# Patient Record
Sex: Male | Born: 1945
Health system: Southern US, Community
[De-identification: ages and names within clinical notes are randomized; demographics above are authoritative.]

## PROBLEM LIST (undated history)

## (undated) DIAGNOSIS — I10 Essential (primary) hypertension: Secondary | ICD-10-CM

## (undated) DIAGNOSIS — H409 Unspecified glaucoma: Secondary | ICD-10-CM

## (undated) DIAGNOSIS — N281 Cyst of kidney, acquired: Secondary | ICD-10-CM

## (undated) DIAGNOSIS — D689 Coagulation defect, unspecified: Secondary | ICD-10-CM

## (undated) DIAGNOSIS — E7800 Pure hypercholesterolemia, unspecified: Secondary | ICD-10-CM

## (undated) DIAGNOSIS — I82409 Acute embolism and thrombosis of unspecified deep veins of unspecified lower extremity: Secondary | ICD-10-CM

## (undated) DIAGNOSIS — R32 Unspecified urinary incontinence: Secondary | ICD-10-CM

## (undated) DIAGNOSIS — K5790 Diverticulosis of intestine, part unspecified, without perforation or abscess without bleeding: Secondary | ICD-10-CM

## (undated) DIAGNOSIS — K7689 Other specified diseases of liver: Secondary | ICD-10-CM

## (undated) DIAGNOSIS — G4730 Sleep apnea, unspecified: Secondary | ICD-10-CM

## (undated) HISTORY — DX: Unspecified urinary incontinence: R32

## (undated) HISTORY — DX: Other specified diseases of liver: K76.89

## (undated) HISTORY — DX: Acute embolism and thrombosis of unspecified deep veins of unspecified lower extremity: I82.409

## (undated) HISTORY — DX: Cyst of kidney, acquired: N28.1

## (undated) HISTORY — DX: Coagulation defect, unspecified: D68.9

## (undated) HISTORY — DX: Diverticulosis of intestine, part unspecified, without perforation or abscess without bleeding: K57.90

## (undated) HISTORY — PX: POLYPECTOMY: SHX149

## (undated) HISTORY — DX: Unspecified glaucoma: H40.9

## (undated) HISTORY — DX: Essential (primary) hypertension: I10

## (undated) HISTORY — DX: Sleep apnea, unspecified: G47.30

---

## 1998-05-04 ENCOUNTER — Emergency Department (HOSPITAL_COMMUNITY): Admission: EM | Admit: 1998-05-04 | Discharge: 1998-05-04 | Payer: Self-pay | Admitting: Emergency Medicine

## 1998-12-04 ENCOUNTER — Encounter: Payer: Self-pay | Admitting: Emergency Medicine

## 1998-12-04 ENCOUNTER — Emergency Department (HOSPITAL_COMMUNITY): Admission: EM | Admit: 1998-12-04 | Discharge: 1998-12-04 | Payer: Self-pay | Admitting: Emergency Medicine

## 1999-10-09 ENCOUNTER — Encounter: Payer: Self-pay | Admitting: Emergency Medicine

## 1999-10-09 ENCOUNTER — Inpatient Hospital Stay (HOSPITAL_COMMUNITY): Admission: EM | Admit: 1999-10-09 | Discharge: 1999-10-12 | Payer: Self-pay | Admitting: Emergency Medicine

## 1999-10-11 ENCOUNTER — Encounter: Payer: Self-pay | Admitting: Cardiology

## 1999-10-13 ENCOUNTER — Encounter: Admission: RE | Admit: 1999-10-13 | Discharge: 1999-10-13 | Payer: Self-pay | Admitting: Urology

## 1999-10-13 ENCOUNTER — Encounter: Payer: Self-pay | Admitting: Urology

## 1999-10-28 ENCOUNTER — Encounter: Payer: Self-pay | Admitting: Urology

## 1999-10-28 ENCOUNTER — Encounter: Admission: RE | Admit: 1999-10-28 | Discharge: 1999-10-28 | Payer: Self-pay | Admitting: Urology

## 1999-11-16 ENCOUNTER — Encounter: Payer: Self-pay | Admitting: Urology

## 1999-11-16 ENCOUNTER — Encounter: Admission: RE | Admit: 1999-11-16 | Discharge: 1999-11-16 | Payer: Self-pay | Admitting: Urology

## 2000-09-21 ENCOUNTER — Ambulatory Visit (HOSPITAL_COMMUNITY): Admission: RE | Admit: 2000-09-21 | Discharge: 2000-09-21 | Payer: Self-pay | Admitting: Orthopedic Surgery

## 2001-03-29 ENCOUNTER — Encounter: Payer: Self-pay | Admitting: Cardiology

## 2001-03-29 ENCOUNTER — Ambulatory Visit (HOSPITAL_COMMUNITY): Admission: RE | Admit: 2001-03-29 | Discharge: 2001-03-29 | Payer: Self-pay | Admitting: Cardiology

## 2002-10-04 HISTORY — PX: KNEE SURGERY: SHX244

## 2003-06-07 ENCOUNTER — Encounter: Payer: Self-pay | Admitting: Urology

## 2003-06-07 ENCOUNTER — Inpatient Hospital Stay (HOSPITAL_COMMUNITY): Admission: EM | Admit: 2003-06-07 | Discharge: 2003-06-09 | Payer: Self-pay | Admitting: Urology

## 2008-03-12 ENCOUNTER — Emergency Department (HOSPITAL_COMMUNITY): Admission: EM | Admit: 2008-03-12 | Discharge: 2008-03-12 | Payer: Self-pay | Admitting: Neurosurgery

## 2009-05-21 ENCOUNTER — Inpatient Hospital Stay (HOSPITAL_COMMUNITY): Admission: RE | Admit: 2009-05-21 | Discharge: 2009-05-26 | Payer: Self-pay | Admitting: Cardiology

## 2009-05-21 ENCOUNTER — Ambulatory Visit: Payer: Self-pay | Admitting: Vascular Surgery

## 2009-05-21 ENCOUNTER — Encounter (INDEPENDENT_AMBULATORY_CARE_PROVIDER_SITE_OTHER): Payer: Self-pay | Admitting: Cardiology

## 2010-04-17 ENCOUNTER — Inpatient Hospital Stay (HOSPITAL_COMMUNITY): Admission: EM | Admit: 2010-04-17 | Discharge: 2010-04-20 | Payer: Self-pay | Admitting: Emergency Medicine

## 2010-12-19 LAB — GLUCOSE, CAPILLARY
Glucose-Capillary: 137 mg/dL — ABNORMAL HIGH (ref 70–99)
Glucose-Capillary: 141 mg/dL — ABNORMAL HIGH (ref 70–99)
Glucose-Capillary: 143 mg/dL — ABNORMAL HIGH (ref 70–99)
Glucose-Capillary: 170 mg/dL — ABNORMAL HIGH (ref 70–99)
Glucose-Capillary: 171 mg/dL — ABNORMAL HIGH (ref 70–99)
Glucose-Capillary: 195 mg/dL — ABNORMAL HIGH (ref 70–99)
Glucose-Capillary: 204 mg/dL — ABNORMAL HIGH (ref 70–99)
Glucose-Capillary: 249 mg/dL — ABNORMAL HIGH (ref 70–99)
Glucose-Capillary: 272 mg/dL — ABNORMAL HIGH (ref 70–99)
Glucose-Capillary: 279 mg/dL — ABNORMAL HIGH (ref 70–99)
Glucose-Capillary: 281 mg/dL — ABNORMAL HIGH (ref 70–99)
Glucose-Capillary: 285 mg/dL — ABNORMAL HIGH (ref 70–99)
Glucose-Capillary: 306 mg/dL — ABNORMAL HIGH (ref 70–99)
Glucose-Capillary: 332 mg/dL — ABNORMAL HIGH (ref 70–99)
Glucose-Capillary: 357 mg/dL — ABNORMAL HIGH (ref 70–99)
Glucose-Capillary: 369 mg/dL — ABNORMAL HIGH (ref 70–99)
Glucose-Capillary: 412 mg/dL — ABNORMAL HIGH (ref 70–99)
Glucose-Capillary: 452 mg/dL — ABNORMAL HIGH (ref 70–99)
Glucose-Capillary: 460 mg/dL — ABNORMAL HIGH (ref 70–99)
Glucose-Capillary: 460 mg/dL — ABNORMAL HIGH (ref 70–99)
Glucose-Capillary: >600 mg/dL (ref 70–99)
Glucose-Capillary: >600 mg/dL (ref 70–99)
Glucose-Capillary: >600 mg/dL (ref 70–99)

## 2010-12-19 LAB — BASIC METABOLIC PANEL WITH GFR
BUN: 11 mg/dL (ref 6–23)
BUN: 18 mg/dL (ref 6–23)
BUN: 22 mg/dL (ref 6–23)
CO2: 27 meq/L (ref 19–32)
CO2: 27 meq/L (ref 19–32)
CO2: 28 meq/L (ref 19–32)
Calcium: 8.5 mg/dL (ref 8.4–10.5)
Calcium: 8.8 mg/dL (ref 8.4–10.5)
Calcium: 9 mg/dL (ref 8.4–10.5)
Chloride: 100 meq/L (ref 96–112)
Chloride: 107 meq/L (ref 96–112)
Chloride: 97 meq/L (ref 96–112)
Creatinine, Ser: 0.86 mg/dL (ref 0.4–1.5)
Creatinine, Ser: 0.96 mg/dL (ref 0.4–1.5)
Creatinine, Ser: 1 mg/dL (ref 0.4–1.5)
GFR calc non Af Amer: >60 mL/min
GFR calc non Af Amer: >60 mL/min
GFR calc non Af Amer: >60 mL/min
Glucose, Bld: 102 mg/dL — ABNORMAL HIGH (ref 70–99)
Glucose, Bld: 133 mg/dL — ABNORMAL HIGH (ref 70–99)
Glucose, Bld: 314 mg/dL — ABNORMAL HIGH (ref 70–99)
Potassium: 3.6 meq/L (ref 3.5–5.1)
Potassium: 3.7 meq/L (ref 3.5–5.1)
Potassium: 3.7 meq/L (ref 3.5–5.1)
Sodium: 132 meq/L — ABNORMAL LOW (ref 135–145)
Sodium: 138 meq/L (ref 135–145)
Sodium: 139 meq/L (ref 135–145)

## 2010-12-19 LAB — CARDIAC PANEL(CRET KIN+CKTOT+MB+TROPI)
CK, MB: 2.7 ng/mL (ref 0.3–4.0)
CK, MB: 3 ng/mL (ref 0.3–4.0)
Relative Index: 1.8 (ref 0.0–2.5)
Relative Index: 1.9 (ref 0.0–2.5)
Total CK: 234 U/L — ABNORMAL HIGH (ref 7–232)
Troponin I: 0.01 ng/mL (ref 0.00–0.06)
Troponin I: 0.02 ng/mL (ref 0.00–0.06)

## 2010-12-19 LAB — URINALYSIS, ROUTINE W REFLEX MICROSCOPIC
Bilirubin Urine: NEGATIVE
Ketones, ur: 15 mg/dL — AB
Nitrite: NEGATIVE
Specific Gravity, Urine: 1.028 (ref 1.005–1.030)
Urobilinogen, UA: 0.2 mg/dL (ref 0.0–1.0)

## 2010-12-19 LAB — LIPID PANEL
Cholesterol: 272 mg/dL — ABNORMAL HIGH (ref 0–200)
HDL: 41 mg/dL (ref >39)
LDL Cholesterol: 202 mg/dL — ABNORMAL HIGH (ref 0–99)
Total CHOL/HDL Ratio: 6.6 RATIO
VLDL: 29 mg/dL (ref 0–40)

## 2010-12-19 LAB — CBC
HCT: 50.5 % (ref 39.0–52.0)
MCV: 96.6 fL (ref 78.0–100.0)
RBC: 5.23 MIL/uL (ref 4.22–5.81)
WBC: 9.8 10*3/uL (ref 4.0–10.5)

## 2010-12-19 LAB — COMPREHENSIVE METABOLIC PANEL
Alkaline Phosphatase: 99 U/L (ref 39–117)
BUN: 31 mg/dL — ABNORMAL HIGH (ref 6–23)
CO2: 18 mEq/L — ABNORMAL LOW (ref 19–32)
Chloride: 79 mEq/L — ABNORMAL LOW (ref 96–112)
GFR calc non Af Amer: 36 mL/min — ABNORMAL LOW (ref >60)
Glucose, Bld: 1139 mg/dL (ref 70–99)
Potassium: 6.6 mEq/L (ref 3.5–5.1)
Total Bilirubin: 1.2 mg/dL (ref 0.3–1.2)

## 2010-12-19 LAB — POCT I-STAT 3, ART BLOOD GAS (G3+)
Acid-Base Excess: 1 mmol/L (ref 0.0–2.0)
Bicarbonate: 23.8 mEq/L (ref 20.0–24.0)

## 2010-12-19 LAB — BASIC METABOLIC PANEL
BUN: 18 mg/dL (ref 6–23)
BUN: 22 mg/dL (ref 6–23)
BUN: 29 mg/dL — ABNORMAL HIGH (ref 6–23)
CO2: 27 mEq/L (ref 19–32)
Calcium: 9.4 mg/dL (ref 8.4–10.5)
Chloride: 94 mEq/L — ABNORMAL LOW (ref 96–112)
Creatinine, Ser: 1.09 mg/dL (ref 0.4–1.5)
Creatinine, Ser: 1.15 mg/dL (ref 0.4–1.5)
GFR calc Af Amer: >60 mL/min (ref >60)
GFR calc non Af Amer: 41 mL/min — ABNORMAL LOW (ref >60)
GFR calc non Af Amer: 53 mL/min — ABNORMAL LOW (ref >60)
GFR calc non Af Amer: >60 mL/min (ref >60)
Glucose, Bld: 444 mg/dL — ABNORMAL HIGH (ref 70–99)
Glucose, Bld: 650 mg/dL (ref 70–99)
Potassium: 4 mEq/L (ref 3.5–5.1)
Potassium: 4.8 mEq/L (ref 3.5–5.1)
Sodium: 137 mEq/L (ref 135–145)

## 2010-12-19 LAB — MRSA PCR SCREENING: MRSA by PCR: NEGATIVE

## 2010-12-19 LAB — C-PEPTIDE: C-Peptide: 1.15 ng/mL (ref 0.80–3.90)

## 2010-12-19 LAB — DIFFERENTIAL
Basophils Absolute: 0 10*3/uL (ref 0.0–0.1)
Basophils Relative: 0 % (ref 0–1)
Monocytes Absolute: 0.4 10*3/uL (ref 0.1–1.0)
Neutro Abs: 8.8 10*3/uL — ABNORMAL HIGH (ref 1.7–7.7)
Neutrophils Relative %: 90 % — ABNORMAL HIGH (ref 43–77)

## 2011-01-09 LAB — GLUCOSE, CAPILLARY
Glucose-Capillary: 115 mg/dL — ABNORMAL HIGH (ref 70–99)
Glucose-Capillary: 118 mg/dL — ABNORMAL HIGH (ref 70–99)
Glucose-Capillary: 133 mg/dL — ABNORMAL HIGH (ref 70–99)
Glucose-Capillary: 139 mg/dL — ABNORMAL HIGH (ref 70–99)
Glucose-Capillary: 143 mg/dL — ABNORMAL HIGH (ref 70–99)
Glucose-Capillary: 165 mg/dL — ABNORMAL HIGH (ref 70–99)
Glucose-Capillary: 169 mg/dL — ABNORMAL HIGH (ref 70–99)
Glucose-Capillary: 196 mg/dL — ABNORMAL HIGH (ref 70–99)
Glucose-Capillary: 227 mg/dL — ABNORMAL HIGH (ref 70–99)
Glucose-Capillary: 85 mg/dL (ref 70–99)

## 2011-01-09 LAB — COMPREHENSIVE METABOLIC PANEL
ALT: 62 U/L — ABNORMAL HIGH (ref 0–53)
Albumin: 3.4 g/dL — ABNORMAL LOW (ref 3.5–5.2)
Alkaline Phosphatase: 62 U/L (ref 39–117)
GFR calc Af Amer: >60 mL/min (ref >60)
Potassium: 3.8 mEq/L (ref 3.5–5.1)
Sodium: 138 mEq/L (ref 135–145)
Total Protein: 6.9 g/dL (ref 6.0–8.3)

## 2011-01-09 LAB — HEMOGLOBIN A1C: Hgb A1c MFr Bld: 8.9 % — ABNORMAL HIGH (ref 4.6–6.1)

## 2011-01-09 LAB — PSA: PSA: 2.78 ng/mL (ref 0.10–4.00)

## 2011-01-09 LAB — URINALYSIS, MICROSCOPIC ONLY
Glucose, UA: 100 mg/dL — AB
Leukocytes, UA: NEGATIVE
pH: 6 (ref 5.0–8.0)

## 2011-01-09 LAB — CBC
Hemoglobin: 14.3 g/dL (ref 13.0–17.0)
Hemoglobin: 14.9 g/dL (ref 13.0–17.0)
Platelets: 157 10*3/uL (ref 150–400)
Platelets: 162 10*3/uL (ref 150–400)
RBC: 4.54 MIL/uL (ref 4.22–5.81)
RDW: 12.5 % (ref 11.5–15.5)
RDW: 12.6 % (ref 11.5–15.5)
WBC: 5.4 10*3/uL (ref 4.0–10.5)

## 2011-01-09 LAB — LIPID PANEL
Total CHOL/HDL Ratio: 6.8 RATIO
VLDL: 22 mg/dL (ref 0–40)

## 2011-01-09 LAB — PROTIME-INR
INR: 1.1 (ref 0.00–1.49)
INR: 1.1 (ref 0.00–1.49)
INR: 2.1 — ABNORMAL HIGH (ref 0.00–1.49)
Prothrombin Time: 13.7 seconds (ref 11.6–15.2)
Prothrombin Time: 14 seconds (ref 11.6–15.2)

## 2011-01-09 LAB — HEPARIN LEVEL (UNFRACTIONATED)
Heparin Unfractionated: 0.5 IU/mL (ref 0.30–0.70)
Heparin Unfractionated: 0.51 IU/mL (ref 0.30–0.70)
Heparin Unfractionated: 1.03 IU/mL — ABNORMAL HIGH (ref 0.30–0.70)

## 2011-02-19 NOTE — Discharge Summary (Signed)
NAME:  CHENG, DEC                        ACCOUNT NO.:  [id number]   MEDICAL RECORD NO.:  [id number]                   PATIENT TYPE:  INP   LOCATION:  0354                                 FACILITY:  [facility]   PHYSICIAN:  Mohan N. Sharyn Lull, M.D.              DATE OF BIRTH:  25-Jul-1946   DATE OF ADMISSION:  06/07/2003  DATE OF DISCHARGE:  06/09/2003                                 DISCHARGE SUMMARY   ADMISSION DIAGNOSES:  1. New onset diabetes mellitus.  2. Elevated blood pressure, rule out hypertension.  3. Left ventricular hypertrophy.  4. Benign hypertrophy of prostate.  5. Gastrointestinal reflux disease.  6. History of kidney stones.   FINAL DIAGNOSES:  1. New onset diabetes mellitus.  2. Benign hypertrophy of prostate.  3. Gastrointestinal reflux disease.  4. History of kidney stones.   DISCHARGE HOME MEDICATIONS:  1. Actos 30 mg one tablet daily.  2. Glucotrol XL 5 mg one tablet twice daily.  3. Baby aspirin 81 mg one tablet daily.  4. Protonix 40 mg one tablet daily.   ACTIVITY:  As tolerated.   DIET:  Low-salt, low-cholesterol, 1800 calorie, ADA diet.   SPECIAL INSTRUCTIONS:  The patient has been advised to monitor blood sugar  twice daily and chart.   FOLLOWUP:  Follow up with Dr. Shana Chute in one week.  Call Dr. Logan Bores and get  appointment in one to two weeks.  Diabetes outpatient teaching program will  contact the patient regarding diabetic teaching and education.   BRIEF HISTORY:  Mr. Landin is a 65 year old black male with no significant  past medical history, except for benign hypertrophy of prostate.  He came to  the [facility] ER for cystoscopy and possible TURP.  During  preoperative evaluation, the patient was noted to have blood sugar of 483  and surgery was postponed.  The patient complains of polyuria, polydipsia,  and polyphagia for approximately one month.  Denies any blurring of vision.  Denies tingling and numbness in the arms.   Denies chest pain, shortness of  breath, palpitations, lightheadedness, or syncope.  Denies PND, orthopnea,  or leg swelling.  Denies fever, chills, or cough.  Denies any dysuria.   PAST MEDICAL HISTORY:  As above plus a history of kidney stones.   PAST SURGICAL HISTORY:  He had left knee surgery in 2001.  He also had right  eye surgery many years ago.   ALLERGIES:  He is allergic to CODEINE.  He gets a rash.   MEDICATIONS:  At home, he was on aspirin.   SOCIAL HISTORY:  He is married and has two daughters.  No history of smoking  or alcohol abuse.  He works for [employer].  He was born [place]   FAMILY HISTORY:  His father is alive.  He is 31 and has a history of angina.  His mother is  diabetes.  She is 77.  Two brothers and one sister in good  health.   PHYSICAL EXAMINATION:  GENERAL APPEARANCE:  He was alert, awake, and  oriented x 3 and in no acute distress.  VITAL SIGNS:  His blood pressure was 150/90 and the pulse was 88 and  regular.  HEENT:  The conjunctivae were pink.  NECK:  Supple.  No JVD.  No bruit.  LUNGS:  Clear to auscultation without rhonchi or rales.  CARDIOVASCULAR:  S1 and S2 were normal.  There was no S3 gallop or murmur.  ABDOMEN:  Soft.  Bowel sounds were present and nontender.  EXTREMITIES:  There is no cyanosis, clubbing, or edema.  Pedal pulses were  2+ and equal bilaterally.   LABORATORY DATA:  EKG showed normal sinus rhythm with LVH.  No acute  ischemic changes were noted.  His chest x-ray showed no active disease.  His  hemoglobin was 15.8, hematocrit 45.2, and white count 6.8.  The sodium was  129, potassium 4.3, chloride 96, bicarbonate 23, blood sugar 483, BUN 13,  and creatinine 1.2.  The hemoglobin A1C was 15.3.  His lipid panel showed  cholesterol 185, triglycerides 109, LDL 126, and HDL low at 37.  Urinalysis:  The specific gravity was 1.039, ketones were positive, and proteins were  negative.   Repeat electrolytes on June 07, 2003, were sodium 134,  potassium 3.5, chloride 103, bicarbonate 24, blood sugar 379, BUN 13, and  creatinine 1.0.  On June 08, 2003, his blood sugar was 169.  The  potassium was 3.1, which was replaced.  Today the potassium is 3.7.   BRIEF HOSPITAL COURSE:  The patient was admitted to a regular floor.  The  patient was started on sliding scale insulin and was started on Glucotrol  and Actos.  His blood sugar was well controlled yesterday morning, but he  had  an episode of severe hyperglycemia with blood pressure in the 380s last  night.  This morning his blood sugar is 220.  The patient has been  ambulating in the hallway without any problems and eating well.  The patient  will be discharged home on the above medications.  He will be followed as an  outpatient by Dr. Shana Chute in one week.                                               Eduardo Osier. Sharyn Lull, M.D.    MNH/MEDQ  D:  06/09/2003  T:  06/09/2003  Job:  [id number]   cc:   [name], M.D.  [address] [id number]  [place]  [place] [address]  Fax: [phone]   Jamison Neighbor, M.D.  [address]  [place]  [place] [address]  Fax: [phone]

## 2011-02-19 NOTE — Op Note (Signed)
[place]. [facility]  Patient:    Charles Parker                      MRN: [id number] Proc. Date: 10/09/99 Adm. Date:  [id number] Attending:  Pola Corn CC:         [name], M.D.                           Operative Report  PREOPERATIVE DIAGNOSIS: Left ureteral calculus with hydronephrosis.  POSTOPERATIVE DIAGNOSIS: Left ureteral calculus with hydronephrosis.  PROCEDURE: Cystoscopy, left ventral interpretation with left double-J catheter insertion.  SURGEON:  Jamison Neighbor, M.D.  ANESTHESIA: General.  COMPLICATIONS: None.  DRAINS: 6 French x 26 cm double-J catheter.  INDICATIONS:  The patient was admitted to the hospital on October 09, 1999, with  abdominal pain.  The patients pain was somewhat midline but when a CT scan was obtained, he was noted to have hydronephrosis on the left with delayed function and what appeared to be a 4.5 mm stone in the left ureter.  The patients pain was persistent requiring a combination of Toradol and PCA.  He is now to undergo cystoscopy and retrograde studies with stent placement.  The stone appears to be in the more proximal ureter so it is not ideal for ureteroscopy although it is somewhat difficult to visualize on plain x-ray.  The patient will have the stent placement with followup x-rays.  If the stone has dropped down into the proximal ureter he notes that we will try to extract it but otherwise he will be scheduled for ESWL.  DESCRIPTION OF PROCEDURE:  After successful induction of general anesthesia, the patient was placed in the dorsal lithotomy position, prepped with Betadine and draped in the usual fashion.  A cystoscope was inserted.  The urethra was visualized in its entirety and found to be normal beyond the verumontanum. There was no significant prostatic obstruction.  The bladder itself was carefully inspected.  It was free of any tumor or stones.  Both ureteral orifices  were normal in configuration and location.  The left ureter was somewhat tight so the 6 French stent had to be inserted using a guide wire.  This was then advanced just into he distal ureter.  The distal ureter was normal but from approximately the midportion on it was dilated.  It was difficult to visualize the stone because there was some air in the system and bubbled did obscure the stone itself.  A guide wire was then passed up into the collecting system which was noted to be ______ but no filling defects were seen and it appeared otherwise to be normal.  A 6 French x 26 cm double J was then advanced up into the kidney where it coiled normally in the kidney as well as within the bladder.  The bladder was drained.  The patient tolerated the procedure well and was taken to the recovery room in ood condition.  The patient will be watched today. If this pain is gone he certainly can be discharged.  Arrangements were made for him to have follow up ESWL. DD:  10/21/99 TD:  10/11/99 Job: [id number]

## 2011-02-19 NOTE — Op Note (Signed)
[facility]  Patient:    Charles Parker, Charles Parker                     MRN: [id number] Proc. Date: 09/21/00 Adm. Date:  [id number] Attending:  Marlowe Kays Page                           Operative Report  PREOPERATIVE DIAGNOSIS:  Torn medial lateral menisci left knee. POSTOPERATIVE DIAGNOSIS:  Torn medial lateral menisci left knee.  OPERATION: 1. Left knee arthroscopy with partial medial lateral meniscectomies. 2. Shaving of medial femoral condyle and patella.  SURGEON:  Illene Labrador. Aplington, M.D.  ASSISTANT:  Nurse.  ANESTHESIA:  General.  PATHOLOGY AND JUSTIFICATION FOR PROCEDURE:  He injured his left knee while working, roughly 7-1/2 months ago.  Plain films demonstrated some very slight medial compartment narrowing with some very slight tenderness over both joint lines.  I obtained an MRI of his knee which demonstrated tears of both menisci.  Accordingly, he is here today for the above-mentioned surgery.  At surgery, he had extensive tears of both menisci with the medial meniscus torn in the entire posterior half and the lateral meniscus torn at both the anterior and posterior curves.  He also had some grade 2/4 chondromalacia of the mid femoral condyle and patella.  DESCRIPTION OF PROCEDURE:  Satisfactory general anesthesia, pneumatic tourniquet placed, thigh stabilizer.  The left knee was prepped with Dura-Prep and draped in a sterile field.  Superior medial saline inflow.  First an anterolateral portal medial compartment of the knee joint was evaluated.  The extensive tears were noted, and I trimmed the meniscus in the entire posterior half including the intercondylar area back to a stable rim with baskets and then shaved it down until smooth with the 3.5 shaver.  Along the way, I also shaved down the weightbearing surface of the medial femoral condyle until smooth.  Looking at the medial gutter and suprapatellar area, he had some wear of the  patella which I pictured but actually had to shave it through the reverse portal techniques for a better approachability.  Before shifting portals, I also used the underwater Bovie to coagulate some bleeders in the anterior compartment.  Next, through an anterior medial portal looking laterally, small amount of synovitis was resected.  The two tears of the curves were noted, and I trimmed most of these tears back with small baskets and then shaved down the rim until smooth with the 3.5 shaver with final pictures being taken.  I then looked up in the lateral gutter and suprapatellar area and shaved down the patella until smooth with final pictures being taken as well.  The knee joint was then irrigated until clear, and all fluid possible was removed.  The two anterior portals were closed with 4-0 nylon; 20 cc of 1/2% Marcaine with adrenalin was flowed through the inflow apparatus which was removed and this portal closed with 4-0 nylon as well. Betadine and Adaptic dry sterile dressing applied.  Tourniquet was released. He tolerated the procedure well.  At the time of this dictation was on his way to the recovery room in satisfactory condition with no known complications. DD:  09/21/00 TD:  09/22/00 Job: [id number]

## 2011-02-19 NOTE — Consult Note (Signed)
[place]. [facility]  Patient:    Charles Parker                      MRN: [id number] Proc. Date: 10/10/99 Adm. Date:  [id number] Attending:  Pola Corn CC:         Jamison Neighbor, M.D.             [name], M.D.                          Consultation Report  REASON FOR CONSULTATION:  Abdominal pain secondary to ureteral calculus.  HISTORY:  This 65 year old male was admitted from the emergency room last night  with acute abdominal pain along with some associated nausea.  As part of his evaluation, the patient underwent a CT scan which showed a 4.5 mm stone in the eft ureter with high-grade obstruction.  The patient has not had much relief of his  pain overnight and has not yet passed the stone.  He is still requiring morphine.  The patient does complain of pain that is a little more diffuse and toward the midline than one would normally expect, and he does not have a true left flank pain. The patient does have a negative amylase, and the CT scan did show a normal appendix.  PAST MEDICAL HISTORY:  Quite unremarkable.  He denies any chronic medical illnesses.  His only previous surgery was a surgery to the right eye as a child. His urologic history is pertinent for a stone that he passed at one time in the  past, but he has not had ongoing stones since that time.  MEDICATIONS:  The patient takes no medications at this time.  ALLERGIES:  CODEINE but no other allergies.  SOCIAL HISTORY:  Unremarkable.  He does not smoke or use alcohol.  FAMILY HISTORY:  Noncontributory.  His mother had diabetes.  His father is alive and in good health.  REVIEW OF SYSTEMS:  Noncontributory.  He has no cardiac, pulmonary, neurologic,  hematologic, endocrine, musculoskeletal problems.  He is known to wear glasses.  PHYSICAL EXAMINATION:  GENERAL:  He is a well-developed, well-nourished male in no acute distress.  VITAL SIGNS:  He is afebrile.   Vital signs are stable.  ABDOMEN:  His abdominal pain appears to be on the midline and really does not lateralize much to the left-hand side, but he is somewhat tender on specific examination.  LABORATORY DATA:  CT scan was reviewed, and there is a 4.5 mm stone in the mid eft ureter with high-grade obstruction.  I think this is the most logical source for his pain.  Urine has not been checked but will be checked.  His acute abdomen and chest was unremarkable.  The stone was not well seen on this study indicating the stone may be not particularly well opacified.  His laboratory studies show a white count of 7.5, hematocrit 43.4.  Coags were normal.  Chemistries were unremarkable with the exception of a slightly elevated glucose of 139.  Lipase was normal, and amylase was normal.  IMPRESSION:  Nephrolithiasis.  PLAN:  The patient will need cystoscopy retrograde studies and stent placement versus stone extraction tomorrow.  He will be n.p.o. tonight, and this will be one if he does not pass the stone.  The patient will eventually require ESWL and/or in situ laser lithotripsy done at [facility] where that equipment is available.  DD:  10/10/99 TD:  10/10/99 Job: [id number]

## 2011-02-19 NOTE — Discharge Summary (Signed)
[place]. [facility]  Patient:    Charles Parker                      MRN: [id number] Adm. Date:  [id number] Disc. Date: [id number] Attending:  Pola Corn Dictator:   Lyla Son. Achilles Dunk.                           Discharge Summary  DISCHARGE DIAGNOSES: 1. Calculus of the ureter. 2. Hydronephrosis.  BRIEF HISTORY:  This is a 65 year old patient who presented to the [facility] on October 09, 1999 with complaint of abdominal pain.  He described vomiting and diarrhea.  He has noted some coffee-ground material in the vomitus.  Also, some loss of appetite.  He had a CT scan of the abdomen done, and it showed a 4.5 mm left mid ureter stone with high-grade obstruction and hydronephrosis.  The patient was subsequently admitted for abdominal pain and urolithiasis.  HOSPITAL COURSE:  The patient was admitted to the medical service.  He was placed on IV fluids for hydration.  He was treated with IV pain medication. The patient was seen by urology on October 10, 1999.  After review of the CT scan, the patient was taken to the operating room on October 11, 1999, where he had a cystoscopy and a J-stent was also inserted. The patient continued to complain of some abdominal pain.  He received a soapsuds enema and had a good BM following that.  The abdominal pain resolved after the BM.  Plans were made for discharge on October 11, 1999, but the patient spiked a temperature of 101.  This was treated with fluids and Tylenol, and after fluids, Tylenol, and antibiotic therapy, the patient was discharged home on October 12, 1999.  He is scheduled for follow up with Dr. Shana Chute in one week. He is scheduled for follow up with Dr. Logan Bores for reevaluation of the stent. The patient is to call the office for an appointment.  DISCHARGE MEDICATIONS: 1. Lorcet. 2. Septra DS 1 twice a day. DD:  12/02/99 TD:  12/02/99 Job: [id number]

## 2011-12-27 ENCOUNTER — Ambulatory Visit (INDEPENDENT_AMBULATORY_CARE_PROVIDER_SITE_OTHER): Payer: MEDICARE | Admitting: Surgery

## 2011-12-27 ENCOUNTER — Encounter (INDEPENDENT_AMBULATORY_CARE_PROVIDER_SITE_OTHER): Payer: Self-pay | Admitting: Surgery

## 2011-12-27 VITALS — BP 132/80 | HR 72 | Temp 97.6°F | Resp 18 | Ht 72.0 in | Wt 198.2 lb

## 2011-12-27 DIAGNOSIS — D126 Benign neoplasm of colon, unspecified: Secondary | ICD-10-CM | POA: Insufficient documentation

## 2011-12-27 NOTE — Progress Notes (Signed)
Subjective:     Patient ID: Charles Parker, male   DOB: 1946-03-11, 66 y.o.   MRN: [id number]  HPI  Charles Parker  1946-04-02 [id number]  Patient Care Team: Pola Corn, MD as PCP - General (Cardiology) Shirley Friar, MD as Consulting Physician (Gastroenterology)  This patient is a 66 y.o.male who presents today for surgical evaluation at the request of Dr. Bosie Clos.   The patient is a pleasant gentleman who got his first colonoscopy. He was found to have a suspicious mass at the rectosigmoid junction. Biopsies showed adenomatous polyp with high at least high-grade dysplasia. Patient concerned, Dr. Bosie Clos sent the patient to [place] for surgical evaluation.  The patient usually has about 2 bowel movements a day. He can walk half a mile every day without much problems. He comes today with his brother. His brother may have had one polyp and a prior colonoscopy but otherwise no strong family history of polyps. No bleeding. No constipation. No history of wound infections. No prior abdominal surgeries.  Patient Active Problem List  Diagnoses  . 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 Hypertension  . Diabetes mellitus type II    Past Medical History  Diagnosis Date  . Cancer   . Hypertension   . Arthritis   . Diabetes mellitus     type II  . Tubulovillous adenoma     Past Surgical History  Procedure Date  . Knee surgery 2004    left    History   Social History  . Marital Status: Widowed    Spouse Name: N/A    Number of Children: N/A  . Years of Education: N/A   Occupational History  . Not on file.   Social History Main Topics  . Smoking status: Never Smoker   . Smokeless tobacco: Never Used  . Alcohol Use: No  . Drug Use: No  . Sexually Active: Not on file   Other Topics Concern  . Not on file   Social History Narrative  . No narrative on file    History reviewed. No pertinent family history.  Current  outpatient prescriptions:glyBURIDE-metformin (GLUCOVANCE) 1.25-250 MG per tablet, Take 1 tablet by mouth daily with breakfast. Patient to confirm dosage and medication type on next visit. Patient not sure as of 12/27/11., Disp: , Rfl:   Allergies  Allergen Reactions  . Codeine Rash    All over the body    BP 132/80  Pulse 72  Temp(Src) 97.6 F (36.4 C) (Temporal)  Resp 18  Ht 6' (1.829 m)  Wt 198 lb 3.2 oz (89.903 kg)  BMI 26.88 kg/m2     Review of Systems  Constitutional: Negative for fever, chills and diaphoresis.  HENT: Negative for nosebleeds, sore throat, facial swelling, mouth sores, trouble swallowing and ear discharge.   Eyes: Negative for photophobia, discharge and visual disturbance.  Respiratory: Negative for choking, chest tightness, shortness of breath and stridor.   Cardiovascular: Negative for chest pain and palpitations.  Gastrointestinal: Negative for nausea, vomiting, abdominal pain, diarrhea, constipation, blood in stool, abdominal distention, anal bleeding and rectal pain.       No personal nor family history of GI/colon cancer, inflammatory bowel disease, irritable bowel syndrome, allergy such as Celiac Sprue, dietary/dairy problems, colitis, ulcers nor gastritis.    No recent sick contacts/gastroenteritis.  No travel outside the country.  No changes in diet.    Genitourinary: Negative for dysuria, urgency, difficulty urinating and testicular pain.  Musculoskeletal:  Negative for myalgias, back pain, arthralgias and gait problem.  Skin: Negative for color change, pallor, rash and wound.  Neurological: Negative for dizziness, speech difficulty, weakness, numbness and headaches.  Hematological: Negative for adenopathy. Does not bruise/bleed easily.  Psychiatric/Behavioral: Negative for hallucinations, confusion and agitation.       Objective:   Physical Exam  Constitutional: He is oriented to person, place, and time. He appears well-developed and  well-nourished. No distress.  HENT:  Head: Normocephalic.  Mouth/Throat: Oropharynx is clear and moist. No oropharyngeal exudate.  Eyes: Conjunctivae and EOM are normal. Pupils are equal, round, and reactive to light. No scleral icterus.  Neck: Normal range of motion. Neck supple. No tracheal deviation present.  Cardiovascular: Normal rate, regular rhythm and intact distal pulses.   Pulmonary/Chest: Effort normal and breath sounds normal. No respiratory distress.  Abdominal: Soft. He exhibits no distension. There is no tenderness. Hernia confirmed negative in the right inguinal area and confirmed negative in the left inguinal area.  Genitourinary:       Perianal skin clean with good hygiene.  No pruritis.  No external skin tags / hemorrhoids of significance.  No pilonidal disease.  No fissure.  No abscess/fistula.    Tolerates digital rectal exam.  Normal sphincter tone.   No rectal masses - I cannot feel the polyp.  Hemorrhoidal piles WNL   Musculoskeletal: Normal range of motion. He exhibits no tenderness.  Lymphadenopathy:    He has no cervical adenopathy.       Right: No inguinal adenopathy present.       Left: No inguinal adenopathy present.  Neurological: He is alert and oriented to person, place, and time. No cranial nerve deficit. He exhibits normal muscle tone. Coordination normal.  Skin: Skin is warm and dry. No rash noted. He is not diaphoretic. No erythema. No pallor.  Psychiatric: He has a normal mood and affect. His behavior is normal. Judgment and thought content normal.       Assessment:     Polyp with high grade dysplasia at rectosigmoid junction    Plan:     I think he would benefit from surgery. This is to proximally feel the finger. I am skeptical that he would be a TEM candidate. The high-grade dysplasia in suspicious morphology both by endoscopy and on pathology make me concerned this has an early cancer. Therefore, I think he needs a formal resection. I would  like to do rigid proctoscopy at the time of surgery to get a sense of the location of this. Fortunately, Dr. Bosie Clos tattooed the area which is very helpful.  I did discuss the surgery he and his brother:  The anatomy & physiology of the digestive tract was discussed.  The pathophysiology was discussed.  Natural history risks without surgery was discussed.   I feel the risks of no intervention will lead to serious problems that outweigh the operative risks; therefore, I recommended a partial colectomy to remove the pathology.  Laparoscopic & open techniques were discussed.   Risks such as bleeding, infection, abscess, leak, reoperation, possible ostomy, hernia, heart attack, death, and other risks were discussed.  I noted a good likelihood this will help address the problem.   Goals of post-operative recovery were discussed as well.  We will work to minimize complications.  An educational handout on the pathology was given as well.  Questions were answered.  The patient expresses understanding & wishes to proceed with surgery.

## 2011-12-27 NOTE — Patient Instructions (Signed)
Colon Polyps A polyp is extra tissue that grows inside your body. Colon polyps grow in the large intestine. The large intestine, also called the colon, is part of your digestive system. It is a long, hollow tube at the end of your digestive tract where your body makes and stores stool. Most polyps are not dangerous. They are benign. This means they are not cancerous. But over time, some types of polyps can turn into cancer. Polyps that are smaller than a pea are usually not harmful. But larger polyps could someday become or may already be cancerous. To be safe, doctors remove all polyps and test them.  WHO GETS POLYPS? Anyone can get polyps, but certain people are more likely than others. You may have a greater chance of getting polyps if:  You are over 50.   You have had polyps before.   Someone in your family has had polyps.   Someone in your family has had cancer of the large intestine.   Find out if someone in your family has had polyps. You may also be more likely to get polyps if you:   Eat a lot of fatty foods.   Smoke.   Drink alcohol.   Do not exercise.   Eat too much.  SYMPTOMS  Most small polyps do not cause symptoms. People often do not know they have one until their caregiver finds it during a regular checkup or while testing them for something else. Some people do have symptoms like these:  Bleeding from the anus. You might notice blood on your underwear or on toilet paper after you have had a bowel movement.   Constipation or diarrhea that lasts more than a week.   Blood in the stool. Blood can make stool look black or it can show up as red streaks in the stool.  If you have any of these symptoms, see your caregiver. HOW DOES THE DOCTOR TEST FOR POLYPS? The doctor can use four tests to check for polyps:  Digital rectal exam. The caregiver wears gloves and checks your rectum (the last part of the large intestine) to see if it feels normal. This test would find  polyps only in the rectum. Your caregiver may need to do one of the other tests listed below to find polyps higher up in the intestine.   Barium enema. The caregiver puts a liquid called barium into your rectum before taking x-rays of your large intestine. Barium makes your intestine look white in the pictures. Polyps are dark, so they are easy to see.   Sigmoidoscopy. With this test, the caregiver can see inside your large intestine. A thin flexible tube is placed into your rectum. The device is called a sigmoidoscope, which has a light and a tiny video camera in it. The caregiver uses the sigmoidoscope to look at the last third of your large intestine.   Colonoscopy. This test is like sigmoidoscopy, but the caregiver looks at all of the large intestine. It usually requires sedation. This is the most common method for finding and removing polyps.  TREATMENT   The caregiver will remove the polyp during sigmoidoscopy or colonoscopy. The polyp is then tested for cancer.   If you have had polyps, your caregiver may want you to get tested regularly in the future.  PREVENTION  There is not one sure way to prevent polyps. You might be able to lower your risk of getting them if you:  Eat more fruits and vegetables and less fatty   food.   Do not smoke.   Avoid alcohol.   Exercise every day.   Lose weight if you are overweight.   Eating more calcium and folate can also lower your risk of getting polyps. Some foods that are rich in calcium are milk, cheese, and broccoli. Some foods that are rich in folate are chickpeas, kidney beans, and spinach.   Aspirin might help prevent polyps. Studies are under way.  Document Released: 06/16/2004 Document Revised: 09/09/2011 Document Reviewed: 11/22/2007 [employer] Patient Information 2012 [employer]. 

## 2012-01-26 ENCOUNTER — Encounter (HOSPITAL_COMMUNITY): Payer: Self-pay | Admitting: Pharmacy Technician

## 2012-01-27 ENCOUNTER — Ambulatory Visit (HOSPITAL_COMMUNITY)
Admission: RE | Admit: 2012-01-27 | Discharge: 2012-01-27 | Disposition: A | Discharge status: Home / Self Care | Payer: Medicare Other | Source: Ambulatory Visit | Attending: Surgery | Admitting: Surgery

## 2012-01-27 ENCOUNTER — Encounter (HOSPITAL_COMMUNITY): Payer: Self-pay

## 2012-01-27 ENCOUNTER — Encounter (HOSPITAL_COMMUNITY)
Admission: RE | Admit: 2012-01-27 | Discharge: 2012-01-27 | Disposition: A | Discharge status: Home / Self Care | Payer: Medicare Other | Source: Ambulatory Visit | Attending: Surgery | Admitting: Surgery

## 2012-01-27 DIAGNOSIS — Z01812 Encounter for preprocedural laboratory examination: Secondary | ICD-10-CM | POA: Insufficient documentation

## 2012-01-27 DIAGNOSIS — Z01818 Encounter for other preprocedural examination: Secondary | ICD-10-CM | POA: Insufficient documentation

## 2012-01-27 DIAGNOSIS — I10 Essential (primary) hypertension: Secondary | ICD-10-CM | POA: Insufficient documentation

## 2012-01-27 DIAGNOSIS — D126 Benign neoplasm of colon, unspecified: Secondary | ICD-10-CM | POA: Insufficient documentation

## 2012-01-27 DIAGNOSIS — Z01810 Encounter for preprocedural cardiovascular examination: Secondary | ICD-10-CM | POA: Insufficient documentation

## 2012-01-27 DIAGNOSIS — Z79899 Other long term (current) drug therapy: Secondary | ICD-10-CM | POA: Insufficient documentation

## 2012-01-27 HISTORY — DX: Pure hypercholesterolemia, unspecified: E78.00

## 2012-01-27 LAB — SURGICAL PCR SCREEN
MRSA, PCR: NEGATIVE
Staphylococcus aureus: NEGATIVE

## 2012-01-27 LAB — BASIC METABOLIC PANEL
BUN: 12 mg/dL (ref 6–23)
Calcium: 10 mg/dL (ref 8.4–10.5)
Creatinine, Ser: 0.85 mg/dL (ref 0.50–1.35)
GFR calc non Af Amer: 89 mL/min — ABNORMAL LOW (ref >90)
Glucose, Bld: 121 mg/dL — ABNORMAL HIGH (ref 70–99)

## 2012-01-27 LAB — CBC
HCT: 45.1 % (ref 39.0–52.0)
Hemoglobin: 15 g/dL (ref 13.0–17.0)
MCH: 30.2 pg (ref 26.0–34.0)
MCHC: 33.3 g/dL (ref 30.0–36.0)
MCV: 90.9 fL (ref 78.0–100.0)
RDW: 12.2 % (ref 11.5–15.5)

## 2012-01-27 MED ORDER — FLEET ENEMA 7-19 GM/118ML RE ENEM
1.0000 | ENEMA | Freq: Once | RECTAL | Status: DC
  Filled 2012-01-27: qty 1

## 2012-01-27 NOTE — Patient Instructions (Addendum)
20 Charles Parker  01/27/2012   Your procedure is scheduled on:  02/03/12   Surgery 0730-1040 Thursday  Report to [facility] at  0515     AM.  Call this number if you have problems the morning of surgery: [phone]     Or PST   [id number]  Charles Parker             STOP ANTIINFLAMMATORIES, HERBAL MEDS  4 DAYS BEFORE SURGERY  Remember:              DO NOT TAKE ANY DIABETES MEDICINE MORNING OF SURGERY  Do not eat food after 7 pm Wed night as per office.         May have clear liquids: all day and  after 7pm Wednesday night  -- Until midnight   THEN NONE  Clear liquids include soda, tea, black coffee, apple or grape juice, broth.  Take these medicines the morning of surgery with A SIP OF WATER:none BOWEL PREP AS PER OFFICE  Do not wear jewelry, make-up or nail polish.  Do not wear lotions, powders, or perfumes. You may wear deodorant.  Do not shave 48 hours prior to surgery.  Do not bring valuables to the hospital.  Contacts, dentures or bridgework may not be worn into surgery.  Leave suitcase in the car. After surgery it may be brought to your room.  For patients admitted to the hospital, checkout time is 11:00 AM the day of discharge.             FLEETS ENEMA MORNING OF SURGERY BEFORE ARRIVAL TO HOSPITAL  Patients discharged the day of surgery will not be allowed to drive home.  Name and phone number of your driver:  brother                                                                    Special Instructions: CHG Shower Use Special Wash: 1/2 bottle night before surgery and 1/2 bottle morning of surgery. REGULAR SOAP FACE AND PRIVATES                     MEN-MAY SHAVE FACE MORNING OF SURGERY  Please read over the following fact sheets that you were given: MRSA Information

## 2012-02-02 HISTORY — PX: STOMACH SURGERY: SHX791

## 2012-02-02 MED ORDER — BUPIVACAINE 0.25 % ON-Q PUMP DUAL CATH 300 ML
300.0000 mL | INJECTION | Status: DC
  Filled 2012-02-02: qty 300

## 2012-02-03 ENCOUNTER — Encounter (HOSPITAL_COMMUNITY): Payer: Self-pay | Admitting: Anesthesiology

## 2012-02-03 ENCOUNTER — Inpatient Hospital Stay (HOSPITAL_COMMUNITY)
Admission: RE | Admit: 2012-02-03 | Discharge: 2012-02-07 | DRG: 333 | Disposition: A | Discharge status: Home / Self Care | Payer: Medicare Other | Source: Ambulatory Visit | Attending: Surgery | Admitting: Surgery

## 2012-02-03 ENCOUNTER — Ambulatory Visit (HOSPITAL_COMMUNITY): Payer: Medicare Other | Admitting: Anesthesiology

## 2012-02-03 ENCOUNTER — Encounter (HOSPITAL_COMMUNITY): Payer: Self-pay | Admitting: *Deleted

## 2012-02-03 ENCOUNTER — Encounter (HOSPITAL_COMMUNITY)
Admission: RE | Disposition: A | Discharge status: Home / Self Care | Payer: Self-pay | Source: Ambulatory Visit | Attending: Surgery

## 2012-02-03 ENCOUNTER — Encounter (HOSPITAL_COMMUNITY): Payer: Self-pay

## 2012-02-03 DIAGNOSIS — E119 Type 2 diabetes mellitus without complications: Secondary | ICD-10-CM | POA: Diagnosis present

## 2012-02-03 DIAGNOSIS — S3720XA Unspecified injury of bladder, initial encounter: Secondary | ICD-10-CM

## 2012-02-03 DIAGNOSIS — D126 Benign neoplasm of colon, unspecified: Secondary | ICD-10-CM

## 2012-02-03 DIAGNOSIS — K7689 Other specified diseases of liver: Secondary | ICD-10-CM | POA: Diagnosis present

## 2012-02-03 DIAGNOSIS — D129 Benign neoplasm of anus and anal canal: Principal | ICD-10-CM | POA: Diagnosis present

## 2012-02-03 DIAGNOSIS — Y9210 Unspecified residential institution as the place of occurrence of the external cause: Secondary | ICD-10-CM | POA: Diagnosis not present

## 2012-02-03 DIAGNOSIS — S3730XA Unspecified injury of urethra, initial encounter: Secondary | ICD-10-CM

## 2012-02-03 DIAGNOSIS — D128 Benign neoplasm of rectum: Principal | ICD-10-CM | POA: Diagnosis present

## 2012-02-03 DIAGNOSIS — IMO0002 Reserved for concepts with insufficient information to code with codable children: Secondary | ICD-10-CM | POA: Diagnosis not present

## 2012-02-03 DIAGNOSIS — I10 Essential (primary) hypertension: Secondary | ICD-10-CM | POA: Diagnosis present

## 2012-02-03 DIAGNOSIS — E7800 Pure hypercholesterolemia, unspecified: Secondary | ICD-10-CM | POA: Diagnosis present

## 2012-02-03 HISTORY — PX: BLADDER NECK RECONSTRUCTION: SHX1239

## 2012-02-03 HISTORY — PX: PROCTOSCOPY: SHX2266

## 2012-02-03 LAB — GLUCOSE, CAPILLARY
Glucose-Capillary: 180 mg/dL — ABNORMAL HIGH (ref 70–99)
Glucose-Capillary: 153 mg/dL — ABNORMAL HIGH (ref 70–99)
Glucose-Capillary: 140 mg/dL — ABNORMAL HIGH (ref 70–99)
Glucose-Capillary: 136 mg/dL — ABNORMAL HIGH (ref 70–99)

## 2012-02-03 LAB — CREATININE, SERUM
Creatinine, Ser: 0.93 mg/dL (ref 0.50–1.35)
GFR calc non Af Amer: 86 mL/min — ABNORMAL LOW (ref >90)

## 2012-02-03 LAB — TYPE AND SCREEN

## 2012-02-03 LAB — CBC
Hemoglobin: 13.7 g/dL (ref 13.0–17.0)
MCH: 30.9 pg (ref 26.0–34.0)
MCHC: 33.8 g/dL (ref 30.0–36.0)
Platelets: 158 10*3/uL (ref 150–400)
RBC: 4.43 MIL/uL (ref 4.22–5.81)

## 2012-02-03 LAB — ABO/RH: ABO/RH(D): O NEG

## 2012-02-03 SURGERY — RESECTION, RECTUM, LOW ANTERIOR, LAPAROSCOPIC
Anesthesia: General | Wound class: Clean Contaminated

## 2012-02-03 MED ORDER — ONDANSETRON 8 MG/NS 50 ML IVPB
8.0000 mg | Freq: Four times a day (QID) | INTRAVENOUS | Status: DC | PRN
  Filled 2012-02-03: qty 8

## 2012-02-03 MED ORDER — ALVIMOPAN 12 MG PO CAPS
ORAL_CAPSULE | ORAL | Status: AC
  Administered 2012-02-03: 12 mg via ORAL
  Filled 2012-02-03: qty 1

## 2012-02-03 MED ORDER — ROCURONIUM BROMIDE 100 MG/10ML IV SOLN
INTRAVENOUS | Status: DC | PRN
  Administered 2012-02-03: 10 mg via INTRAVENOUS
  Administered 2012-02-03: 20 mg via INTRAVENOUS
  Administered 2012-02-03 (×3): 10 mg via INTRAVENOUS
  Administered 2012-02-03: 50 mg via INTRAVENOUS

## 2012-02-03 MED ORDER — SPOT INK MARKER SYRINGE KIT
PACK | SUBMUCOSAL | Status: DC | PRN
  Administered 2012-02-03: 10 mL via SUBMUCOSAL

## 2012-02-03 MED ORDER — SACCHAROMYCES BOULARDII 250 MG PO CAPS
250.0000 mg | ORAL_CAPSULE | Freq: Two times a day (BID) | ORAL | Status: DC
  Administered 2012-02-03 – 2012-02-07 (×8): 250 mg via ORAL
  Filled 2012-02-03 (×9): qty 1

## 2012-02-03 MED ORDER — SODIUM CHLORIDE 0.9 % IV SOLN
1.0000 g | INTRAVENOUS | Status: AC
  Administered 2012-02-03: 1 g via INTRAVENOUS

## 2012-02-03 MED ORDER — IRBESARTAN 300 MG PO TABS
300.0000 mg | ORAL_TABLET | Freq: Every day | ORAL | Status: DC
  Administered 2012-02-03 – 2012-02-07 (×5): 300 mg via ORAL
  Filled 2012-02-03 (×5): qty 1

## 2012-02-03 MED ORDER — LACTATED RINGERS IR SOLN
Status: DC | PRN
  Administered 2012-02-03: 3000 mL

## 2012-02-03 MED ORDER — ONDANSETRON HCL 4 MG/2ML IJ SOLN: 4.0000 mg | Freq: Four times a day (QID) | INTRAMUSCULAR | Status: DC | PRN

## 2012-02-03 MED ORDER — SODIUM CHLORIDE 0.9 % IV SOLN
INTRAVENOUS | Status: AC
  Filled 2012-02-03: qty 1

## 2012-02-03 MED ORDER — BUPIVACAINE ON-Q PAIN PUMP (FOR ORDER SET NO CHG)
INJECTION | Status: AC
  Filled 2012-02-03: qty 1

## 2012-02-03 MED ORDER — HEPARIN SODIUM (PORCINE) 5000 UNIT/ML IJ SOLN
5000.0000 [IU] | Freq: Once | INTRAMUSCULAR | Status: AC
  Administered 2012-02-03: 5000 [IU] via SUBCUTANEOUS

## 2012-02-03 MED ORDER — BUPIVACAINE-EPINEPHRINE 0.25% -1:200000 IJ SOLN
INTRAMUSCULAR | Status: AC
  Filled 2012-02-03: qty 1

## 2012-02-03 MED ORDER — HYDROCHLOROTHIAZIDE 25 MG PO TABS
25.0000 mg | ORAL_TABLET | Freq: Every day | ORAL | Status: DC
  Administered 2012-02-03 – 2012-02-07 (×5): 25 mg via ORAL
  Filled 2012-02-03 (×5): qty 1

## 2012-02-03 MED ORDER — HEPARIN SODIUM (PORCINE) 5000 UNIT/ML IJ SOLN
INTRAMUSCULAR | Status: AC
  Administered 2012-02-03: 5000 [IU] via SUBCUTANEOUS
  Filled 2012-02-03: qty 1

## 2012-02-03 MED ORDER — LIP MEDEX EX OINT
1.0000 "application " | TOPICAL_OINTMENT | Freq: Two times a day (BID) | CUTANEOUS | Status: DC
  Administered 2012-02-03 – 2012-02-07 (×8): 1 via TOPICAL
  Filled 2012-02-03 (×2): qty 7

## 2012-02-03 MED ORDER — FENTANYL CITRATE 0.05 MG/ML IJ SOLN
INTRAMUSCULAR | Status: DC | PRN
  Administered 2012-02-03: 50 ug via INTRAVENOUS
  Administered 2012-02-03: 100 ug via INTRAVENOUS
  Administered 2012-02-03 (×4): 50 ug via INTRAVENOUS
  Administered 2012-02-03: 100 ug via INTRAVENOUS
  Administered 2012-02-03: 50 ug via INTRAVENOUS

## 2012-02-03 MED ORDER — INSULIN ASPART 100 UNIT/ML ~~LOC~~ SOLN: 0.0000 [IU] | Freq: Every day | SUBCUTANEOUS | Status: DC

## 2012-02-03 MED ORDER — 0.9 % SODIUM CHLORIDE (POUR BTL) OPTIME
TOPICAL | Status: DC | PRN
  Administered 2012-02-03: 2000 mL

## 2012-02-03 MED ORDER — ONDANSETRON HCL 4 MG PO TABS: 4.0000 mg | ORAL_TABLET | Freq: Four times a day (QID) | ORAL | Status: DC | PRN

## 2012-02-03 MED ORDER — PROPOFOL 10 MG/ML IV EMUL
INTRAVENOUS | Status: DC | PRN
  Administered 2012-02-03: 200 mg via INTRAVENOUS

## 2012-02-03 MED ORDER — MIDAZOLAM HCL 5 MG/5ML IJ SOLN
INTRAMUSCULAR | Status: DC | PRN
  Administered 2012-02-03: 2 mg via INTRAVENOUS

## 2012-02-03 MED ORDER — HYDROMORPHONE HCL PF 1 MG/ML IJ SOLN
0.2500 mg | INTRAMUSCULAR | Status: DC | PRN
  Administered 2012-02-03 (×4): 0.5 mg via INTRAVENOUS

## 2012-02-03 MED ORDER — INSULIN ASPART 100 UNIT/ML ~~LOC~~ SOLN
0.0000 [IU] | Freq: Three times a day (TID) | SUBCUTANEOUS | Status: DC
  Administered 2012-02-04: 2 [IU] via SUBCUTANEOUS
  Administered 2012-02-04: 3 [IU] via SUBCUTANEOUS
  Administered 2012-02-05 (×2): 2 [IU] via SUBCUTANEOUS
  Administered 2012-02-06: 3 [IU] via SUBCUTANEOUS
  Administered 2012-02-06: 2 [IU] via SUBCUTANEOUS
  Administered 2012-02-06: 3 [IU] via SUBCUTANEOUS
  Administered 2012-02-07: 5 [IU] via SUBCUTANEOUS
  Administered 2012-02-07 (×2): 2 [IU] via SUBCUTANEOUS

## 2012-02-03 MED ORDER — MAGIC MOUTHWASH
15.0000 mL | Freq: Four times a day (QID) | ORAL | Status: DC | PRN
  Filled 2012-02-03: qty 15

## 2012-02-03 MED ORDER — HEPARIN SODIUM (PORCINE) 5000 UNIT/ML IJ SOLN
5000.0000 [IU] | Freq: Three times a day (TID) | INTRAMUSCULAR | Status: DC
Start: 2012-02-03 — End: 2012-02-07
  Administered 2012-02-03 – 2012-02-07 (×12): 5000 [IU] via SUBCUTANEOUS
  Filled 2012-02-03 (×14): qty 1

## 2012-02-03 MED ORDER — HYDROMORPHONE HCL PF 1 MG/ML IJ SOLN
INTRAMUSCULAR | Status: DC | PRN
  Administered 2012-02-03: 0.5 mg via INTRAVENOUS
  Administered 2012-02-03: 1 mg via INTRAVENOUS
  Administered 2012-02-03: 0.5 mg via INTRAVENOUS

## 2012-02-03 MED ORDER — PSYLLIUM 95 % PO PACK
1.0000 | PACK | Freq: Two times a day (BID) | ORAL | Status: DC
  Administered 2012-02-03 – 2012-02-07 (×8): 1 via ORAL
  Filled 2012-02-03 (×9): qty 1

## 2012-02-03 MED ORDER — ALVIMOPAN 12 MG PO CAPS
12.0000 mg | ORAL_CAPSULE | Freq: Once | ORAL | Status: AC
  Administered 2012-02-03: 12 mg via ORAL

## 2012-02-03 MED ORDER — HYDROMORPHONE HCL PF 1 MG/ML IJ SOLN
INTRAMUSCULAR | Status: AC
  Filled 2012-02-03: qty 1

## 2012-02-03 MED ORDER — STERILE WATER FOR IRRIGATION IR SOLN
Status: DC | PRN
  Administered 2012-02-03: 1500 mL

## 2012-02-03 MED ORDER — BUPIVACAINE-EPINEPHRINE 0.25% -1:200000 IJ SOLN
INTRAMUSCULAR | Status: DC | PRN
  Administered 2012-02-03: 50 mL

## 2012-02-03 MED ORDER — PROMETHAZINE HCL 25 MG/ML IJ SOLN
6.2500 mg | INTRAMUSCULAR | Status: DC | PRN
  Administered 2012-02-03: 6.25 mg via INTRAVENOUS

## 2012-02-03 MED ORDER — ACETAMINOPHEN 10 MG/ML IV SOLN
INTRAVENOUS | Status: DC | PRN
  Administered 2012-02-03: 1000 mg via INTRAVENOUS

## 2012-02-03 MED ORDER — ALVIMOPAN 12 MG PO CAPS
12.0000 mg | ORAL_CAPSULE | Freq: Two times a day (BID) | ORAL | Status: DC
  Administered 2012-02-04 (×2): 12 mg via ORAL
  Filled 2012-02-03 (×4): qty 1

## 2012-02-03 MED ORDER — VALSARTAN-HYDROCHLOROTHIAZIDE 320-25 MG PO TABS: 1.0000 | ORAL_TABLET | Freq: Every day | ORAL | Status: DC

## 2012-02-03 MED ORDER — HYDROMORPHONE HCL PF 1 MG/ML IJ SOLN
0.5000 mg | INTRAMUSCULAR | Status: DC | PRN
  Administered 2012-02-03 – 2012-02-05 (×6): 1 mg via INTRAVENOUS
  Administered 2012-02-05: 2 mg via INTRAVENOUS
  Administered 2012-02-05: 1 mg via INTRAVENOUS
  Administered 2012-02-05: 2 mg via INTRAVENOUS
  Administered 2012-02-05: 1 mg via INTRAVENOUS
  Administered 2012-02-05 – 2012-02-06 (×2): 2 mg via INTRAVENOUS
  Administered 2012-02-06: 1 mg via INTRAVENOUS
  Administered 2012-02-06 (×3): 2 mg via INTRAVENOUS
  Filled 2012-02-03 (×2): qty 2
  Filled 2012-02-03 (×4): qty 1
  Filled 2012-02-03 (×3): qty 2
  Filled 2012-02-03: qty 1
  Filled 2012-02-03: qty 2
  Filled 2012-02-03 (×3): qty 1
  Filled 2012-02-03: qty 2
  Filled 2012-02-03: qty 1

## 2012-02-03 MED ORDER — ALUM & MAG HYDROXIDE-SIMETH 200-200-20 MG/5ML PO SUSP: 30.0000 mL | Freq: Four times a day (QID) | ORAL | Status: DC | PRN

## 2012-02-03 MED ORDER — PROMETHAZINE HCL 25 MG/ML IJ SOLN
INTRAMUSCULAR | Status: AC
  Filled 2012-02-03: qty 1

## 2012-02-03 MED ORDER — GLIMEPIRIDE 2 MG PO TABS
2.0000 mg | ORAL_TABLET | Freq: Every day | ORAL | Status: DC
  Administered 2012-02-04 – 2012-02-07 (×4): 2 mg via ORAL
  Filled 2012-02-03 (×6): qty 1

## 2012-02-03 MED ORDER — ACETAMINOPHEN 10 MG/ML IV SOLN
INTRAVENOUS | Status: AC
  Filled 2012-02-03: qty 100

## 2012-02-03 MED ORDER — LACTATED RINGERS IV SOLN
INTRAVENOUS | Status: DC | PRN
  Administered 2012-02-03 (×3): via INTRAVENOUS

## 2012-02-03 MED ORDER — ACETAMINOPHEN 325 MG PO TABS
650.0000 mg | ORAL_TABLET | Freq: Four times a day (QID) | ORAL | Status: DC
  Administered 2012-02-03 – 2012-02-07 (×17): 650 mg via ORAL
  Filled 2012-02-03 (×18): qty 2

## 2012-02-03 MED ORDER — PROMETHAZINE HCL 25 MG/ML IJ SOLN: 12.5000 mg | Freq: Four times a day (QID) | INTRAMUSCULAR | Status: DC | PRN

## 2012-02-03 MED ORDER — LIDOCAINE HCL (CARDIAC) 20 MG/ML IV SOLN
INTRAVENOUS | Status: DC | PRN
  Administered 2012-02-03: 50 mg via INTRAVENOUS

## 2012-02-03 MED ORDER — LORAZEPAM BOLUS VIA INFUSION
0.5000 mg | Freq: Three times a day (TID) | INTRAVENOUS | Status: DC | PRN
  Filled 2012-02-03: qty 1

## 2012-02-03 MED ORDER — DIPHENHYDRAMINE HCL 50 MG/ML IJ SOLN: 12.5000 mg | Freq: Four times a day (QID) | INTRAMUSCULAR | Status: DC | PRN

## 2012-02-03 MED ORDER — ONDANSETRON HCL 4 MG/2ML IJ SOLN
INTRAMUSCULAR | Status: DC | PRN
  Administered 2012-02-03: 4 mg via INTRAVENOUS

## 2012-02-03 MED ORDER — METOPROLOL TARTRATE 12.5 MG HALF TABLET
12.5000 mg | ORAL_TABLET | Freq: Two times a day (BID) | ORAL | Status: DC | PRN
  Filled 2012-02-03: qty 1

## 2012-02-03 MED ORDER — HYDROMORPHONE HCL PF 1 MG/ML IJ SOLN
INTRAMUSCULAR | Status: AC
  Administered 2012-02-03: 1 mg via INTRAVENOUS
  Filled 2012-02-03: qty 1

## 2012-02-03 MED ORDER — LACTATED RINGERS IV SOLN
INTRAVENOUS | Status: DC
  Administered 2012-02-03 – 2012-02-04 (×3): via INTRAVENOUS
  Administered 2012-02-05 – 2012-02-06 (×2): 75 mL/h via INTRAVENOUS
  Administered 2012-02-06 – 2012-02-07 (×2): via INTRAVENOUS

## 2012-02-03 SURGICAL SUPPLY — 94 items
APPLIER CLIP 5 13 M/L LIGAMAX5 (MISCELLANEOUS)
APPLIER CLIP ROT 10 11.4 M/L (STAPLE)
BLADE EXTENDED COATED 6.5IN (ELECTRODE) ×3 IMPLANT
BLADE HEX COATED 2.75 (ELECTRODE) ×3 IMPLANT
BLADE SURG SZ10 CARB STEEL (BLADE) ×3 IMPLANT
CABLE HIGH FREQUENCY MONO STRZ (ELECTRODE) ×3 IMPLANT
CANISTER SUCTION 2500CC (MISCELLANEOUS) ×3 IMPLANT
CATH KIT ON Q 5IN DUAL SLV (PAIN MANAGEMENT) IMPLANT
CELLS DAT CNTRL 66122 CELL SVR (MISCELLANEOUS) IMPLANT
CLIP APPLIE 5 13 M/L LIGAMAX5 (MISCELLANEOUS) IMPLANT
CLIP APPLIE ROT 10 11.4 M/L (STAPLE) IMPLANT
CLOTH BEACON ORANGE TIMEOUT ST (SAFETY) ×3 IMPLANT
COVER MAYO STAND STRL (DRAPES) ×3 IMPLANT
DECANTER SPIKE VIAL GLASS SM (MISCELLANEOUS) ×3 IMPLANT
DRAIN CHANNEL 15F RND FF 3/16 (WOUND CARE) ×3 IMPLANT
DRAIN CHANNEL 19F RND (DRAIN) IMPLANT
DRAPE LAPAROSCOPIC ABDOMINAL (DRAPES) ×3 IMPLANT
DRAPE LG THREE QUARTER DISP (DRAPES) ×6 IMPLANT
DRAPE UTILITY 15X26 (DRAPE) ×6 IMPLANT
DRAPE WARM FLUID 44X44 (DRAPE) ×6 IMPLANT
DRSG TEGADERM 2-3/8X2-3/4 SM (GAUZE/BANDAGES/DRESSINGS) ×3 IMPLANT
DRSG TEGADERM 4X4.75 (GAUZE/BANDAGES/DRESSINGS) ×9 IMPLANT
ELECT REM PT RETURN 9FT ADLT (ELECTROSURGICAL) ×3
ELECTRODE REM PT RTRN 9FT ADLT (ELECTROSURGICAL) ×2 IMPLANT
ENDOLOOP SUT PDS II  0 18 (SUTURE) ×2
ENDOLOOP SUT PDS II 0 18 (SUTURE) ×4 IMPLANT
EVACUATOR SILICONE 100CC (DRAIN) ×3 IMPLANT
FILTER SMOKE EVAC LAPAROSHD (FILTER) IMPLANT
GAUZE SPONGE 2X2 8PLY STRL LF (GAUZE/BANDAGES/DRESSINGS) ×4 IMPLANT
GLOVE BIOGEL PI IND STRL 7.0 (GLOVE) ×2 IMPLANT
GLOVE BIOGEL PI INDICATOR 7.0 (GLOVE) ×1
GLOVE ECLIPSE 8.0 STRL XLNG CF (GLOVE) ×6 IMPLANT
GLOVE INDICATOR 8.0 STRL GRN (GLOVE) ×3 IMPLANT
GOWN STRL NON-REIN LRG LVL3 (GOWN DISPOSABLE) ×3 IMPLANT
GOWN STRL REIN XL XLG (GOWN DISPOSABLE) ×6 IMPLANT
HAND ACTIVATED (MISCELLANEOUS) IMPLANT
KIT BASIN OR (CUSTOM PROCEDURE TRAY) ×3 IMPLANT
LEGGING LITHOTOMY PAIR STRL (DRAPES) ×3 IMPLANT
LIGASURE IMPACT 36 18CM CVD LR (INSTRUMENTS) IMPLANT
NS IRRIG 1000ML POUR BTL (IV SOLUTION) ×6 IMPLANT
PENCIL BUTTON HOLSTER BLD 10FT (ELECTRODE) ×3 IMPLANT
RELOAD BLUE (STAPLE) ×6 IMPLANT
RTRCTR WOUND ALEXIS 18CM MED (MISCELLANEOUS)
SCISSORS LAP 5X35 DISP (ENDOMECHANICALS) ×3 IMPLANT
SEALER TISSUE G2 CVD JAW 35 (ENDOMECHANICALS) ×2 IMPLANT
SEALER TISSUE G2 CVD JAW 45CM (ENDOMECHANICALS) ×1
SET IRRIG TUBING LAPAROSCOPIC (IRRIGATION / IRRIGATOR) ×3 IMPLANT
SLEEVE Z-THREAD 5X100MM (TROCAR) IMPLANT
SPONGE GAUZE 2X2 STER 10/PKG (GAUZE/BANDAGES/DRESSINGS) ×2
SPONGE GAUZE 4X4 12PLY (GAUZE/BANDAGES/DRESSINGS) IMPLANT
SPONGE LAP 18X18 X RAY DECT (DISPOSABLE) ×6 IMPLANT
STAPLE ECHEON FLEX 60 POW ENDO (STAPLE) ×3 IMPLANT
STAPLER CIRC ILS CVD 33MM 37CM (STAPLE) ×3 IMPLANT
STAPLER CUT CVD 40MM BLUE (STAPLE) ×3 IMPLANT
STAPLER CUT RELOAD BLUE (STAPLE) ×3 IMPLANT
STAPLER VISISTAT 35W (STAPLE) ×3 IMPLANT
STRIP CLOSURE SKIN 1/2X4 (GAUZE/BANDAGES/DRESSINGS) ×3 IMPLANT
SUCTION POOLE TIP (SUCTIONS) ×3 IMPLANT
SUT ETHILON 2 0 PS N (SUTURE) ×3 IMPLANT
SUT MNCRL 0 MO-4 VIOLET 18 CR (SUTURE) IMPLANT
SUT MONOCRYL 0 MO 4 18  CR/8 (SUTURE)
SUT PDS AB 1 CTX 36 (SUTURE) ×6 IMPLANT
SUT PDS AB 1 TP1 96 (SUTURE) IMPLANT
SUT PDS AB 2-0 CT2 27 (SUTURE) IMPLANT
SUT PROLENE 0 CT 2 (SUTURE) ×3 IMPLANT
SUT PROLENE 2 0 CT2 30 (SUTURE) IMPLANT
SUT PROLENE 2 0 KS (SUTURE) IMPLANT
SUT SILK 2 0 (SUTURE) ×1
SUT SILK 2 0 SH CR/8 (SUTURE) ×3 IMPLANT
SUT SILK 2-0 18XBRD TIE 12 (SUTURE) ×2 IMPLANT
SUT SILK 3 0 (SUTURE) ×1
SUT SILK 3 0 SH CR/8 (SUTURE) ×3 IMPLANT
SUT SILK 3-0 18XBRD TIE 12 (SUTURE) ×2 IMPLANT
SUT VIC AB 2-0 SH 18 (SUTURE) IMPLANT
SUT VIC AB 2-0 SH 27 (SUTURE) ×1
SUT VIC AB 2-0 SH 27X BRD (SUTURE) ×2 IMPLANT
SUT VIC AB 4-0 SH 18 (SUTURE) ×3 IMPLANT
SUT VICRYL 2 0 18  UND BR (SUTURE) ×1
SUT VICRYL 2 0 18 UND BR (SUTURE) ×2 IMPLANT
SYS LAPSCP GELPORT 120MM (MISCELLANEOUS) ×3
SYSTEM LAPSCP GELPORT 120MM (MISCELLANEOUS) ×2 IMPLANT
TAPE UMBILICAL COTTON 1/8X30 (MISCELLANEOUS) ×6 IMPLANT
TOWEL OR 17X26 10 PK STRL BLUE (TOWEL DISPOSABLE) ×6 IMPLANT
TOWEL OR NON WOVEN STRL DISP B (DISPOSABLE) ×6 IMPLANT
TRAY FOLEY CATH 14FRSI W/METER (CATHETERS) ×3 IMPLANT
TRAY LAP CHOLE (CUSTOM PROCEDURE TRAY) ×3 IMPLANT
TROCAR XCEL NON-BLD 5MMX100MML (ENDOMECHANICALS) ×3 IMPLANT
TROCAR Z-THREAD FIOS 11X100 BL (TROCAR) IMPLANT
TROCAR Z-THREAD FIOS 5X100MM (TROCAR) ×9 IMPLANT
TROCAR Z-THREAD SLEEVE 11X100 (TROCAR) IMPLANT
TUBING FILTER THERMOFLATOR (ELECTROSURGICAL) ×3 IMPLANT
TUNNELER SHEATH ON-Q 16GX12 DP (PAIN MANAGEMENT) ×3 IMPLANT
YANKAUER SUCT BULB TIP 10FT TU (MISCELLANEOUS) ×3 IMPLANT
YANKAUER SUCT BULB TIP NO VENT (SUCTIONS) ×3 IMPLANT

## 2012-02-03 NOTE — H&P (Signed)
Charles Parker  1946/07/25 [id number]  CARE TEAM:  PCP: Pola Corn, MD, MD  Outpatient Care Team: Patient Care Team: Pola Corn, MD as PCP - General (Cardiology) Shirley Friar, MD as Consulting Physician (Gastroenterology)  Inpatient Treatment Team: Treatment Team: Attending Provider: Ardeth Sportsman, MD   This patient is a 66 y.o.male who presents today for surgical evaluation.   This patient is a 66 y.o.male who presents today for surgical evaluation at the request of Dr. Bosie Clos.   The patient is a pleasant gentleman who got his first colonoscopy. He was found to have a suspicious mass at the rectosigmoid junction. Biopsies showed adenomatous polyp with high at least high-grade dysplasia. He was concerned, Dr. Bosie Clos sent the patient to [place] for surgical evaluation.  The patient usually has about 2 bowel movements a day. He can walk half a mile every day without much problems. He comes today with his brother. His brother may have had one polyp and a prior colonoscopy but otherwise no strong family history of polyps. No bleeding. No constipation. No history of wound infections. No prior abdominal surgeries.  No new events  Patient Active Problem List  Diagnoses  . 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 Hypertension  . Diabetes mellitus type II    Past Medical History  Diagnosis Date  . Hypertension   . Diabetes mellitus     type II  . Tubulovillous adenoma   . Hypercholesterolemia   . Cancer     Past Surgical History  Procedure Date  . Knee surgery 2004    left  . Colonoscopy     History   Social History  . Marital Status: Widowed    Spouse Name: N/A    Number of Children: N/A  . Years of Education: N/A   Occupational History  . Not on file.   Social History Main Topics  . Smoking status: Never Smoker   . Smokeless tobacco: Never Used  . Alcohol Use: No  . Drug Use: No  . Sexually Active: Not on  file   Other Topics Concern  . Not on file   Social History Narrative  . No narrative on file    History reviewed. No pertinent family history.  Current Facility-Administered Medications  Medication Dose Route Frequency Provider Last Rate Last Dose  . alvimopan (ENTEREG) capsule 12 mg  12 mg Oral Once Ardeth Sportsman, MD   12 mg at 02/03/12 0550  . bupivacaine 0.25 % ON-Q pump DUAL CATH 300 mL  300 mL Other Continuous Ardeth Sportsman, MD      . ertapenem [facility]) 1 g in sodium chloride 0.9 % 50 mL IVPB  1 g Intravenous 60 min Pre-Op Ardeth Sportsman, MD      . heparin injection 5,000 Units  5,000 Units Subcutaneous Once Ardeth Sportsman, MD   5,000 Units at 02/03/12 7829     Allergies  Allergen Reactions  . Codeine Rash    All over the body    ROS: Constitutional:  No fevers, chills, sweats.  Weight stable Eyes:  No vision changes, No discharge HENT:  No sore throats, nasal drainage Lymph: No neck swelling, No bruising easily Pulmonary:  No cough, productive sputum CV: No orthopnea, PND  Patient walks 30 minutes for about .5 miles without difficulty.  No exertional chest/neck/shoulder/arm pain.  GI: No personal nor family history of GI/colon cancer, inflammatory bowel disease, irritable bowel syndrome, allergy such as  Celiac Sprue, dietary/dairy problems, colitis, ulcers nor gastritis.    No recent sick contacts/gastroenteritis.  No travel outside the country.  No changes in diet.  Renal: No UTIs, No hematuria Genital:  No drainage, bleeding, masses Musculoskeletal: No severe joint pain.  Good ROM major joints Skin:  No sores or lesions.  No rashes Heme/Lymph:  No easy bleeding.  No swollen lymph nodes  BP 156/82  Pulse 68  Temp(Src) 98.3 F (36.8 C) (Oral)  Resp 18  SpO2 100%  Physical Exam: General: Pt awake/alert/oriented x4 in no major acute distress Eyes: PERRL, normal EOM. Sclera nonicteric Neuro: CN II-XII intact w/o focal sensory/motor deficits. Lymph: No  head/neck/groin lymphadenopathy Psych:  No delerium/psychosis/paranoia HENT: Normocephalic, Mucus membranes moist.  No thrush Neck: Supple, No tracheal deviation Chest: No pain.  Good respiratory excursion. CV:  Pulses intact.  Regular rhythm Abdomen: Soft, Nondistended.  Nontender.  No incarcerated hernias. Ext:  SCDs BLE.  No significant edema.  No cyanosis Skin: No petechiae / purpurae  Results:   Labs: Results for orders placed during the hospital encounter of 02/03/12 (from the past 48 hour(s))  TYPE AND SCREEN     Status: Normal   Collection Time   02/03/12  5:55 AM      Component Value Range Comment   ABO/RH(D) O NEG      Antibody Screen NEG      Sample Expiration 02/06/2012       Imaging / Studies: No results found.  Medications / Allergies: per chart  Antibiotics: Anti-infectives     Start     Dose/Rate Route Frequency Ordered Stop   02/03/12 0530   ertapenem (INVANZ) 1 g in sodium chloride 0.9 % 50 mL IVPB        1 g 100 mL/hr over 30 Minutes Intravenous 60 min pre-op 02/03/12 0530          Dg Chest 2 View  01/27/2012  *RADIOLOGY REPORT*  Clinical Data: Preoperative evaluation for partial colectomy laproscopic bleed.  No current chest complaints.  Hypertension treated medically  CHEST - 2 VIEW  Comparison:  05/21/2009  Findings: Low lung volumes are present and taking this into consideration heart and mediastinal contours are within normal limits.  The lung fields demonstrate some crowding of bronchovascular markings at both lung bases and are otherwise clear with no signs of focal infiltrate or congestive failure evident. No pleural fluid or significant peribronchial cuffing is seen.  Bony structures appear intact.  IMPRESSION: Stable cardiopulmonary appearance with no new focal or acute abnormality seen.  Original Report Authenticated By: Bertha Stakes, M.D.    Assessment  Charles Parker  66 y.o. male  Day of Surgery  Procedure(s): LAPAROSCOPIC PARTIAL  COLECTOMY  Problem List:  Active Problems:  * No active hospital problems. *   Retained polyp in rectosigmoid Plan:  The anatomy & physiology of the digestive tract was discussed.  The pathophysiology was discussed.  Natural history risks without surgery was discussed.   I feel the risks of no intervention will lead to serious problems that outweigh the operative risks; therefore, I recommended a partial colectomy to remove the pathology.  Laparoscopic & open techniques were discussed.   Risks such as bleeding, infection, abscess, leak, reoperation, possible ostomy, hernia, heart attack, death, and other risks were discussed.  I noted a good likelihood this will help address the problem.   Goals of post-operative recovery were discussed as well.  We will work to minimize complications.  An  educational handout on the pathology was given as well.  Questions were answered.  The patient expresses understanding & wishes to proceed with surgery.    Ardeth Sportsman, M.D., F.A.C.S. Gastrointestinal and Minimally Invasive Surgery [facility] [address] [place], [place] [address] [phone] Main / Paging [phone] Voice Mail   02/03/2012

## 2012-02-03 NOTE — Anesthesia Postprocedure Evaluation (Signed)
  Anesthesia Post-op Note  Patient: Charles Parker  Procedure(s) Performed: Procedure(s) (LRB): LAPAROSCOPIC LOW ANTERIOR RESECTION () PROCTOSCOPY () BLADDER NECK REPAIR ()  Patient Location: PACU  Anesthesia Type: General  Level of Consciousness: awake and alert   Airway and Oxygen Therapy: Patient Spontanous Breathing  Post-op Pain: mild  Post-op Assessment: Post-op Vital signs reviewed, Patient's Cardiovascular Status Stable, Respiratory Function Stable, Patent Airway and No signs of Nausea or vomiting  Post-op Vital Signs: stable  Complications: No apparent anesthesia complications

## 2012-02-03 NOTE — Transfer of Care (Signed)
Immediate Anesthesia Transfer of Care Note  Patient: Charles Parker  Procedure(s) Performed: Procedure(s) (LRB): LAPAROSCOPIC LOW ANTERIOR RESECTION () PROCTOSCOPY () BLADDER NECK REPAIR ()  Patient Location: PACU  Anesthesia Type: General  Level of Consciousness: awake and alert   Airway & Oxygen Therapy: Patient Spontanous Breathing and Patient connected to face mask oxygen  Post-op Assessment: Report given to PACU RN and Post -op Vital signs reviewed and stable  Post vital signs: Reviewed and stable  Complications: No apparent anesthesia complications

## 2012-02-03 NOTE — Anesthesia Preprocedure Evaluation (Addendum)
Anesthesia Evaluation  Patient identified by MRN, date of birth, ID band Patient awake    Reviewed: Allergy & Precautions, H&P , NPO status , Patient's Chart, lab work & pertinent test results  Airway Mallampati: II TM Distance: >3 FB Neck ROM: Full    Dental  (+) Loose, Poor Dentition and Dental Advisory Given Loose right lower tooth. Missing many teeth. Broken right upper premolar.:   Pulmonary neg pulmonary ROS,  breath sounds clear to auscultation  Pulmonary exam normal       Cardiovascular Exercise Tolerance: Good hypertension, Pt. on medications Rhythm:Regular Rate:Normal  ECG: normal. CXR: stable with no new abnormality.   Neuro/Psych negative neurological ROS  negative psych ROS   GI/Hepatic negative GI ROS, Neg liver ROS,   Endo/Other  Diabetes mellitus-, Type 2, Oral Hypoglycemic Agents  Renal/GU negative Renal ROS  negative genitourinary   Musculoskeletal negative musculoskeletal ROS (+)   Abdominal   Peds negative pediatric ROS (+)  Hematology negative hematology ROS (+)   Anesthesia Other Findings   Reproductive/Obstetrics negative OB ROS                        Anesthesia Physical Anesthesia Plan  ASA: III  Anesthesia Plan: General   Post-op Pain Management:    Induction: Intravenous  Airway Management Planned: Oral ETT  Additional Equipment:   Intra-op Plan:   Post-operative Plan: Extubation in OR  Informed Consent: I have reviewed the patients History and Physical, chart, labs and discussed the procedure including the risks, benefits and alternatives for the proposed anesthesia with the patient or authorized representative who has indicated his/her understanding and acceptance.   Dental advisory given  Plan Discussed with: CRNA  Anesthesia Plan Comments:        Anesthesia Quick Evaluation

## 2012-02-03 NOTE — Op Note (Signed)
02/03/2012  12:28 PM  PATIENT:  Charles Parker  66 y.o. male  Patient Care Team: Pola Corn, MD as PCP - General (Cardiology) Shirley Friar, MD as Consulting Physician (Gastroenterology)  PRE-OPERATIVE DIAGNOSIS:  Polyp at the rectosigmoid junction  POST-OPERATIVE DIAGNOSIS:   Adenomatous Polyp with high grade dysplasia at the mid rectum Fatty steatohepatosis  PROCEDURE:  Procedure(s): LAPAROSCOPIC LOW ANTERIOR RESECTION PROCTOSCOPY BLADDER REPAIR  SURGEON:  Surgeon(s): Ardeth Sportsman, MD   ASSISTANT: Almond Lint, MD   ANESTHESIA:   local and general  EBL:  Total I/O In: 2000 [I.V.:2000] Out: 300 [Urine:300]  Delay start of Pharmacological VTE agent (>24hrs) due to surgical blood loss or risk of bleeding:  no  DRAINS: (15) Blake drain(s) in the pelvis   SPECIMEN:  Source of Specimen:  1.  Rectosigmoid (open = proximal)  2.  Mid-rectum (open = distal) 3. Anastomotic rings (blue stitch = proximal)  DISPOSITION OF SPECIMEN:  PATHOLOGY  COUNTS:  YES  PLAN OF CARE: Admit to inpatient   PATIENT DISPOSITION:  PACU - hemodynamically stable.  INDICATION: Pleasant retired male with polyps on colonoscopy. Dr. screw was able to remove some. There is a larger one at the rectosigmoid junction that he did not feel was safe to remove. Biopsy showed adenomatous polyp with high-grade dysplasia. He sent me for surgical valuation. Because I cannot feel the polyp digitally and it seemed to be at the rectosigmoid junction, I did not think he was a candidate for me by transanal endoscopic microsurgery. Therefore recommended laparoscopic segmental colonic resection:  The anatomy & physiology of the digestive tract was discussed.  The pathophysiology was discussed.  Natural history risks without surgery was discussed.   I feel the risks of no intervention will lead to serious problems that outweigh the operative risks; therefore, I recommended a partial colectomy to remove the  pathology.  Laparoscopic & open techniques were discussed.   Risks such as bleeding, infection, abscess, leak, reoperation, possible ostomy, hernia, heart attack, death, and other risks were discussed.  I noted a good likelihood this will help address the problem.   Goals of post-operative recovery were discussed as well.  We will work to minimize complications.  An educational handout on the pathology was given as well.  Questions were answered.  The patient expresses understanding & wishes to proceed with surgery.  OR FINDINGS: He had a posterior pedunculated polyp involving about a quarter of the circumference. It was 12-13 cm from anal verge. Her rate a very deep pelvis. This at the junction in the proximal mid rectum.  The anastomosis rests at 9 cm from anal verge.  DESCRIPTION:   Informed consent was confirmed.  The patient underwent general anaesthesia without difficulty.  The patient was positioned appropriately.  VTE prevention in place.  The patient's abdomen was clipped, prepped, & draped in a sterile fashion.  Surgical timeout confirmed our plan.  The patient was positioned in reverse Trendelenburg.  Abdominal entry was gained using optical entry technique in the right upper abdomen.  Entry was clean.  I induced carbon dioxide insufflation.  Camera inspection revealed no injury.  Extra ports were carefully placed under direct laparoscopic visualization.  I look laparoscopically. I could find no tattooing. I cannot see any obvious abnormality.  I scrubbed down. I could not feel the polyp by digital rectal examination.  I performed rigid proctoscopy. I located the polyp at 12-13 cm from anal verge. It is posterior. It was about a quarter  of the circumference. I marked the area with tattooing circumferentially. I changed gown and gloves.  I scrubbed back in. I could not see the tattoo in the peritoneal cavity. This argued towards an extraperitoneal mid rectal polyp. I scored the rectosigmoid  mesentery from the peritoneal reflection, going proximally to the ligament of Treitz. I elevated it got into the retro-mesenteric plane. I could see the testicular vessels and left ureter and kept them posteriorly. Elevated left colon off its attachments to the retroperitoneum up towards the splenic flexure. He had some redundant sigmoid colon so I did not need to do splenic flexure mobilization. I then mobilized in a lateral to medial fashion starting at the descending/sigmoid colonic junction. Followed more distally to the perineal reflection.  I elevated me to recommend dissected down. I came around the peritoneum anteriorly and was able to get into the plane between the sexual organs in his anterior rectal wall. I mobilized posteriorly down to the mid sacrum. I freed stalks laterally until I can generate tattooing. The tattooing was just distal to the perineal reflection which was relatively low.  I went and isolated the inferior mesenteric artery. I transected the superior rectal artery off the inferior mesenteric artery, preserving the left colic artery. I did that using an Enseal bipolar system and used an Endoloop 0 PDS to ligate the stalk.  I isolated the inferior mesenteric vein  ligated  its above the ligament of Treitz using a series of bipolar firings. This gave good mobilization of the left colon.  I then proceeded to place a GelPort through a Pfannenstiel incision. In getting into the preperitoneal fat it was rather thickened. I erred on the cephalad side towards the umbilicus, but  I did get into the dome of the bladder. The bladder was very high. His only a few fingerbreadths below the umbilicus. I saw the injury and had it isolated. I closed in 2 layers using 4-0 Vicryl at the mucosa and a 2-0 PDS using a full-thickness bites. And then took some preperitoneal fat on the bladder and patched that over the repair using interrupted 2-0 Vicryl stitches. Dr. Verlee Rossetti with Urology came in and agreed  with that plan. Discussed with Dr. Vernie Ammons with [facility] as well. They recommended internal drainage and a Foley catheter x7d with f/u cystogram.  I placed the wound protector. The very  large amount of preperitoneal fat and a bulky bladder  Limited visualization. I reached down and I cannot really feel the polyp as it had a thick mesorectum. I circumferentially mobilized the rectum and  found the tattoo just distal to the anterior perineal reflection. I continued total mesorectal dissection distally to get several centimeters distal to the tattooing. I came across these rectum several centimeters distal to the tattooing to thin the area. I tried to come around with a stapler. It was a challenge. His pelvis was a very narrow male pelvis with a lot of fat and a very floppy bladder. Eventually got a contour stapler to fire around that region. I eviscerated the rectosigmoid. I chose area in the descending/sigmoid junction that reached down easily down into the pelvis. I did ligate the mesentery radially using Enseal system. I transected the mucosa of the descending/sigmoid colon junction. Had a soft bowel clamp proximally. Had healthy bleeding from the mucosa.  I scrubbed out of went down to check the colon. I cannot find any definite polyp although I think the stapling was right at that level.  I mobilized the  rectal stump about 6 cm more distally. I had to use laparoscopic 60 cm stapler x2 fire and then an end in the requiring of the contour stapler to finally transect it.  I don't I did rigid proctoscopy and saw no more remaining polyp. I opened up the specimen and the and distal end and felt the mass at the prior staple line on the proximal and mid rectum. Felt like I had good margins it in.   I did rigid proctoscopy and found no more polyp in the rectal stump  And we did a stapled anastomosis using a 33 EEA stapler. I placed a 0 Prolene pursestring around the opened descending colon and brought  that down the rectal stump. Dr. Donell Beers brought up the stapler of the rectal stump. She brought the spike.out.  I placed the anvil of the stapler onto the spike. She tightened down. She held the stapler clamped for 60 seconds. She fired and held  the stapler closed for 30 seconds. She released the stapler. She had 2 excellent anastomotic rings. She did rigid proctoscopy and found the anastomosis at 9 cm. No polyp or other suspicious lesions. No bleeding. I clamped proximal anastomosis and she insufflated well and irrigated. There was no leaking of air. This is a negative air leak test. The colon was quite snug into the pelvis there was no other free space left.  I did copious irrigation. I inspected and hemostasis was excellent. There was no tension at the anastomosis. Low bowel leg well. Evacuated carbon dioxide. I removed the ports. I placed the sheaths for the On-Q catheter. Placed a drain is noted above. I closed the 5mm port sites. A closed Pfannenstiel incision using 0 Vicryl preperitoneal closure allowing the bladder to go back into the abdomen but incorporating the closure more distally. I closed the anterior rectus fascia using #1 PDS stitches transversely. A closed skin with some 4 Monocryl interrupted running stitches and placed a Betadine soaked wicks in between. On-Q catheters were placed in the sheaths the sheaths were peeled away.  Patient has been extubated and is now in the recovery room. He is he hemodynamically stable. I'm about to discuss operative findings with the patient's family

## 2012-02-04 ENCOUNTER — Encounter (HOSPITAL_COMMUNITY): Payer: Self-pay | Admitting: Surgery

## 2012-02-04 LAB — GLUCOSE, CAPILLARY
Glucose-Capillary: 124 mg/dL — ABNORMAL HIGH (ref 70–99)
Glucose-Capillary: 142 mg/dL — ABNORMAL HIGH (ref 70–99)

## 2012-02-04 LAB — BASIC METABOLIC PANEL
BUN: 10 mg/dL (ref 6–23)
GFR calc Af Amer: 80 mL/min — ABNORMAL LOW (ref >90)
GFR calc non Af Amer: 69 mL/min — ABNORMAL LOW (ref >90)

## 2012-02-04 LAB — CBC
Hemoglobin: 12.7 g/dL — ABNORMAL LOW (ref 13.0–17.0)
MCHC: 33.9 g/dL (ref 30.0–36.0)
RBC: 4.1 MIL/uL — ABNORMAL LOW (ref 4.22–5.81)
WBC: 11.2 10*3/uL — ABNORMAL HIGH (ref 4.0–10.5)

## 2012-02-04 LAB — HEMOGLOBIN A1C: Hgb A1c MFr Bld: 9.2 % — ABNORMAL HIGH (ref <5.7)

## 2012-02-04 NOTE — Progress Notes (Signed)
Charles Parker [id number] [birth year]  CARE TEAM:  PCP: Pola Corn, MD, MD  Outpatient Care Team: Patient Care Team: Pola Corn, MD as PCP - General (Cardiology) Shirley Friar, MD as Consulting Physician (Gastroenterology)  Inpatient Treatment Team: Treatment Team: Attending Provider: Ardeth Sportsman, MD; Registered Nurse: Adriana Simas, RN; Technician: Nevada Crane, NT; Technician: Vella Raring, NT; Respiratory Therapist: Renold Genta, RRT  Subjective:  No major events Sore No N/V.  Hungry Not walked yet  Objective:  Vital signs:  Filed Vitals:   02/03/12 2136 02/03/12 2219 02/04/12 0200 02/04/12 0600  BP: 130/73  133/81 117/78  Pulse: 99  103 97  Temp: 98.5 F (36.9 C)  100 F (37.8 C) 99.6 F (37.6 C)  TempSrc: Oral  Oral Oral  Resp: 18  18 18   Height:      Weight:      SpO2: 88% 98% 98% 98%    Last BM Date: 01/01/12  Intake/Output   Yesterday:  05/02 0701 - 05/03 0700 In: 4600 [I.V.:4600] Out: 2830 [Urine:2525; Drains:205; Blood:100] This shift:     Bowel function:  Flatus: ? ("a little, I think")  BM: No  Physical Exam:  General: Pt awake/alert/oriented x4 in no acute distress Eyes: PERRL, normal EOM.  Sclera clear.  No icterus Neuro: CN II-XII intact w/o focal sensory/motor deficits. Lymph: No head/neck/groin lymphadenopathy Psych:  No delerium/psychosis/paranoia HENT: Normocephalic, Mucus membranes moist.  No thrush Neck: Supple, No tracheal deviation Chest: No chest wall pain w good excursion CV:  Pulses intact.  Regular rhythm Abdomen: Soft.  Nondistended.  Mildly tender at incisions only.  No incarcerated hernias.  Dressing old blood.  Pink urine in bag Ext:  SCDs BLE.  No mjr edema.  No cyanosis Skin: No petechiae / purpurae  Results:   Labs: Results for orders placed during the hospital encounter of 02/03/12 (from the past 48 hour(s))  TYPE AND SCREEN     Status: Normal   Collection Time   02/03/12  5:55 AM      Component Value Range Comment   ABO/RH(D) O NEG      Antibody Screen NEG      Sample Expiration 02/06/2012     ABO/RH     Status: Normal   Collection Time   02/03/12  5:55 AM      Component Value Range Comment   ABO/RH(D) O NEG     GLUCOSE, CAPILLARY     Status: Abnormal   Collection Time   02/03/12  5:59 AM      Component Value Range Comment   Glucose-Capillary 124 (*) 70 - 99 (mg/dL)   GLUCOSE, CAPILLARY     Status: Abnormal   Collection Time   02/03/12 12:00 PM      Component Value Range Comment   Glucose-Capillary 180 (*) 70 - 99 (mg/dL)   GLUCOSE, CAPILLARY     Status: Abnormal   Collection Time   02/03/12  1:09 PM      Component Value Range Comment   Glucose-Capillary 153 (*) 70 - 99 (mg/dL)    Comment 1 Documented in Chart      Comment 2 Notify RN     CBC     Status: Abnormal   Collection Time   02/03/12  3:11 PM      Component Value Range Comment   WBC 11.7 (*) 4.0 - 10.5 (K/uL)    RBC 4.43  4.22 - 5.81 (MIL/uL)    Hemoglobin 13.7  13.0 - 17.0 (g/dL)    HCT 24.4  01.0 - 27.2 (%)    MCV 91.4  78.0 - 100.0 (fL)    MCH 30.9  26.0 - 34.0 (pg)    MCHC 33.8  30.0 - 36.0 (g/dL)    RDW 53.6  64.4 - 03.4 (%)    Platelets 158  150 - 400 (K/uL)   CREATININE, SERUM     Status: Abnormal   Collection Time   02/03/12  3:11 PM      Component Value Range Comment   Creatinine, Ser 0.93  0.50 - 1.35 (mg/dL)    GFR calc non Af Amer 86 (*) >90 (mL/min)    GFR calc Af Amer >90  >90 (mL/min)   GLUCOSE, CAPILLARY     Status: Abnormal   Collection Time   02/03/12  6:07 PM      Component Value Range Comment   Glucose-Capillary 140 (*) 70 - 99 (mg/dL)   GLUCOSE, CAPILLARY     Status: Abnormal   Collection Time   02/03/12  9:34 PM      Component Value Range Comment   Glucose-Capillary 136 (*) 70 - 99 (mg/dL)   BASIC METABOLIC PANEL     Status: Abnormal   Collection Time   02/04/12  4:40 AM      Component Value Range Comment   Sodium 136  135 - 145  (mEq/L)    Potassium 3.9  3.5 - 5.1 (mEq/L)    Chloride 100  96 - 112 (mEq/L)    CO2 27  19 - 32 (mEq/L)    Glucose, Bld 148 (*) 70 - 99 (mg/dL)    BUN 10  6 - 23 (mg/dL)    Creatinine, Ser 7.42  0.50 - 1.35 (mg/dL)    Calcium 8.6  8.4 - 10.5 (mg/dL)    GFR calc non Af Amer 69 (*) >90 (mL/min)    GFR calc Af Amer 80 (*) >90 (mL/min)   CBC     Status: Abnormal   Collection Time   02/04/12  4:40 AM      Component Value Range Comment   WBC 11.2 (*) 4.0 - 10.5 (K/uL)    RBC 4.10 (*) 4.22 - 5.81 (MIL/uL)    Hemoglobin 12.7 (*) 13.0 - 17.0 (g/dL)    HCT 59.5 (*) 63.8 - 52.0 (%)    MCV 91.5  78.0 - 100.0 (fL)    MCH 31.0  26.0 - 34.0 (pg)    MCHC 33.9  30.0 - 36.0 (g/dL)    RDW 75.6  43.3 - 29.5 (%)    Platelets 150  150 - 400 (K/uL)   GLUCOSE, CAPILLARY     Status: Abnormal   Collection Time   02/04/12  7:57 AM      Component Value Range Comment   Glucose-Capillary 158 (*) 70 - 99 (mg/dL)     Imaging / Studies: No results found.  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 male  1 Day Post-Op  Procedure(s): LAPAROSCOPIC LOW ANTERIOR RESECTION PROCTOSCOPY BLADDER NECK REPAIR  Recovering   Plan: -f/u pathology -clears - adv diet slowly -anti-ileus protocol -DM - SSI -HTN - ACE Inh/diuretic.  PRN metoprolol -Foley x 7 days & then check cystogram for bladder repair -VTE prophylaxis- SCDs, etc -mobilize as tolerated to help recovery  Ardeth Sportsman, M.D., F.A.C.S. Gastrointestinal and Minimally Invasive  Surgery [facility] [address], [address] [place], [place] [address] [phone] Main / Paging [phone] Voice Mail   02/04/2012

## 2012-02-04 NOTE — Progress Notes (Signed)
CARE MANAGEMENT NOTE 02/04/2012  Patient:  Charles Parker, Charles Parker   Account Number:  [id number]  Date Initiated:  02/04/2012  Documentation initiated by:  DAVIS,RHONDA  Subjective/Objective Assessment:   pt with anterior partial bowel resection     Action/Plan:   lives at home   Anticipated DC Date:  02/07/2012   Anticipated DC Plan:  HOME/SELF CARE  In-house referral  NA      DC Planning Services  NA      Outpatient [facility] Choice  NA   Choice offered to / List presented to:  NA   DME arranged  NA      DME agency  NA     HH arranged  NA      HH agency  NA   Status of service:  In process, will continue to follow Medicare Important Message given?  YES (If response is "NO", the following Medicare IM given date fields will be blank) Date Medicare IM given:  02/03/2012 Date Additional Medicare IM given:    Discharge Disposition:    Per UR Regulation:  Reviewed for med. necessity/level of care/duration of stay  If discussed at Long Length of Stay Meetings, dates discussed:    Comments:  [id number]/Rhonda Davis,RN,BSN,CCM

## 2012-02-05 DIAGNOSIS — E119 Type 2 diabetes mellitus without complications: Secondary | ICD-10-CM

## 2012-02-05 LAB — GLUCOSE, CAPILLARY: Glucose-Capillary: 106 mg/dL — ABNORMAL HIGH (ref 70–99)

## 2012-02-05 NOTE — Plan of Care (Signed)
Problem: Phase II Progression Outcomes Goal: Dressings dry/intact Outcome: Completed/Met Date Met:  02/05/12 Dressing shows small amounts of old drainage.

## 2012-02-05 NOTE — Progress Notes (Signed)
2 Days Post-Op  Subjective: A little nausea.  Had a liquid BM today.  Objective: Vital signs in last 24 hours: Temp:  [99 F (37.2 C)-99.7 F (37.6 C)] 99.7 F (37.6 C) (05/04 0545) Pulse Rate:  [102-107] 102  (05/04 0545) Resp:  [18-19] 18  (05/04 0545) BP: (128-141)/(60-89) 141/89 mmHg (05/04 0545) SpO2:  [92 %-99 %] 96 % (05/04 0545) Last BM Date: 02/05/12  Intake/Output from previous day: 05/03 0701 - 05/04 0700 In: 2968.5 [P.O.:480; I.V.:2488.5] Out: 3725 [Urine:3725] Intake/Output this shift:    PE: Abd-soft, dressings dry, drain output serosanguinous  Lab Results:   Basename 02/04/12 0440 02/03/12 1511  WBC 11.2* 11.7*  HGB 12.7* 13.7  HCT 37.5* 40.5  PLT 150 158   BMET  Basename 02/04/12 0440 02/03/12 1511  NA 136 --  K 3.9 --  CL 100 --  CO2 27 --  GLUCOSE 148* --  BUN 10 --  CREATININE 1.09 0.93  CALCIUM 8.6 --   PT/INR No results found for this basename: LABPROT:2,INR:2 in the last 72 hours Comprehensive Metabolic Panel:    Component Value Date/Time   NA 136 02/04/2012 0440   K 3.9 02/04/2012 0440   CL 100 02/04/2012 0440   CO2 27 02/04/2012 0440   BUN 10 02/04/2012 0440   CREATININE 1.09 02/04/2012 0440   GLUCOSE 148* 02/04/2012 0440   CALCIUM 8.6 02/04/2012 0440   AST 45* 04/17/2010 1150   ALT 59* 04/17/2010 1150   ALKPHOS 99 04/17/2010 1150   BILITOT 1.2 04/17/2010 1150   PROT 8.4* 04/17/2010 1150   ALBUMIN 4.1 04/17/2010 1150     Studies/Results: No results found.  Anti-infectives: Anti-infectives     Start     Dose/Rate Route Frequency Ordered Stop   02/03/12 0530   ertapenem (INVANZ) 1 g in sodium chloride 0.9 % 50 mL IVPB        1 g 100 mL/hr over 30 Minutes Intravenous 60 min pre-op 02/03/12 0530 02/03/12 0759          Assessment Principal Problem:  *Tubulovillous adenoma of colon with high grade dysplasia (rectosigmoid) s/p LAR with bladder repair 02/03/12-bowel function starting to return Active Problems:  Hypertension  Diabetes  mellitus type II-well controlled     LOS: 2 days   Plan: Full liquids.   Charliegh Vasudevan J 02/05/2012

## 2012-02-06 LAB — GLUCOSE, CAPILLARY: Glucose-Capillary: 124 mg/dL — ABNORMAL HIGH (ref 70–99)

## 2012-02-06 MED ORDER — CHLORPROMAZINE HCL 25 MG PO TABS
25.0000 mg | ORAL_TABLET | Freq: Four times a day (QID) | ORAL | Status: DC | PRN
  Administered 2012-02-06: 25 mg via ORAL
  Filled 2012-02-06: qty 1

## 2012-02-06 NOTE — Progress Notes (Signed)
3 Days Post-Op  Subjective: Nausea has improved.  Tolerating liquid diet.  Bowels moving.  Objective: Vital signs in last 24 hours: Temp:  [98.3 F (36.8 C)-99 F (37.2 C)] 98.3 F (36.8 C) (05/05 0630) Pulse Rate:  [86-100] 86  (05/05 0630) Resp:  [18] 18  (05/05 0630) BP: (100-125)/(65-66) 113/66 mmHg (05/05 0630) SpO2:  [95 %-98 %] 95 % (05/05 0630) Last BM Date: 02/06/12  Intake/Output from previous day: 05/04 0701 - 05/05 0700 In: 1162.1 [I.V.:1162.1] Out: 2315 [Urine:2175; Drains:140] Intake/Output this shift:    PE: Abd-soft,  Wounds clean and intact with wicks in lower transverse wound, OnQ catheters removed, drain output serosanguinous  Lab Results:   Basename 02/04/12 0440 02/03/12 1511  WBC 11.2* 11.7*  HGB 12.7* 13.7  HCT 37.5* 40.5  PLT 150 158   BMET  Basename 02/04/12 0440 02/03/12 1511  NA 136 --  K 3.9 --  CL 100 --  CO2 27 --  GLUCOSE 148* --  BUN 10 --  CREATININE 1.09 0.93  CALCIUM 8.6 --   PT/INR No results found for this basename: LABPROT:2,INR:2 in the last 72 hours Comprehensive Metabolic Panel:    Component Value Date/Time   NA 136 02/04/2012 0440   K 3.9 02/04/2012 0440   CL 100 02/04/2012 0440   CO2 27 02/04/2012 0440   BUN 10 02/04/2012 0440   CREATININE 1.09 02/04/2012 0440   GLUCOSE 148* 02/04/2012 0440   CALCIUM 8.6 02/04/2012 0440   AST 45* 04/17/2010 1150   ALT 59* 04/17/2010 1150   ALKPHOS 99 04/17/2010 1150   BILITOT 1.2 04/17/2010 1150   PROT 8.4* 04/17/2010 1150   ALBUMIN 4.1 04/17/2010 1150     Studies/Results: No results found.  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   Laysa Kimmey J 02/06/2012

## 2012-02-07 LAB — GLUCOSE, CAPILLARY
Glucose-Capillary: 127 mg/dL — ABNORMAL HIGH (ref 70–99)
Glucose-Capillary: 138 mg/dL — ABNORMAL HIGH (ref 70–99)
Glucose-Capillary: 215 mg/dL — ABNORMAL HIGH (ref 70–99)

## 2012-02-07 MED ORDER — OXYCODONE HCL 5 MG PO TABS
5.0000 mg | ORAL_TABLET | ORAL | Status: DC | PRN
  Administered 2012-02-07: 10 mg via ORAL
  Filled 2012-02-07: qty 2

## 2012-02-07 MED ORDER — OXYCODONE HCL 5 MG PO TABS: 5.0000 mg | ORAL_TABLET | ORAL | Status: AC | PRN

## 2012-02-07 NOTE — Discharge Summary (Signed)
Physician Discharge Summary  Patient ID: HARLES EVETTS MRN: [id number] DOB/AGE: 1946/10/03 66 y.o.  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 Active Problems:  Hypertension  Diabetes mellitus type II   Discharged Condition: good  Hospital Course: Patient underwent laparoscopically-assisted low anterior resection. He was placed on antidepressant ileus protocol. He mobilized. He was started liquids and at the time of discharge was tolerating a solid diet. He had good pain control on oral medications. He was having bowel movements and flatus.  Based on improvement, I thought it would be reasonable for him to be discharged on postoperative day #4. Instructions were discussed and written  Consults: None  Significant Diagnostic Studies:   Treatments: surgery: Laparoscopically assisted low anterior resection 02/03/2012  Discharge Exam: Blood pressure 109/64, pulse 96, temperature 98.5 F (36.9 C), temperature source Oral, resp. rate 20, height 6' (1.829 m), weight 201 lb (91.173 kg), SpO2 100.00%.  General: Pt awake/alert/oriented x4 in no major acute distress Eyes: PERRL, normal EOM. Sclera nonicteric Neuro: CN II-XII intact w/o focal sensory/motor deficits. Lymph: No head/neck/groin lymphadenopathy Psych:  No delerium/psychosis/paranoia HENT: Normocephalic, Mucus membranes moist.  No thrush Neck: Supple, No tracheal deviation Chest: No pain.  Good respiratory excursion. CV:  Pulses intact.  Regular rhythm Abdomen: Soft, Nondistended.  Minimally tender at incisions only  No incarcerated hernias. Ext:  SCDs BLE.  No significant edema.  No cyanosis Skin: No petechiae / purpurae   Disposition:   Discharge Orders    Future Appointments: Provider: Department: Dept Phone: Center:   02/21/2012 2:15 PM  Ardeth Sportsman, MD [facility] [phone] None     Medication List  As of 02/07/2012  7:08 AM   ASK your doctor about these medications         glimepiride 2 MG tablet   Commonly known as: AMARYL   Take 2 mg by mouth daily before breakfast.      metFORMIN 1000 MG tablet   Commonly known as: GLUCOPHAGE   Take 1,000 mg by mouth daily with breakfast.      simvastatin 20 MG tablet   Commonly known as: ZOCOR   Take 20 mg by mouth daily with breakfast.      valsartan-hydrochlorothiazide 320-25 MG per tablet   Commonly known as: DIOVAN-HCT   Take 1 tablet by mouth daily with breakfast.           Follow-up Information    Follow up with Aaliyah Cancro C., MD in 2 weeks.   Contact information:   [facility] [address] [place] [address] [phone]          Signed: Keilan Nichol C. 02/07/2012, 7:08 AM

## 2012-02-07 NOTE — Discharge Instructions (Signed)
ABDOMINAL SURGERY: POST OP INSTRUCTIONS ° °1. DIET: Follow a light bland diet the first 24 hours after arrival home, such as soup, liquids, crackers, etc.  Be sure to include lots of fluids daily.  Avoid fast food or heavy meals as your are more likely to get nauseated.  Eat a low fat the next few days after surgery.   °2. Take your usually prescribed home medications unless otherwise directed. °3. PAIN CONTROL: °a. Pain is best controlled by a usual combination of three different methods TOGETHER: °i. Ice/Heat °ii. Over the counter pain medication °iii. Prescription pain medication °b. Most patients will experience some swelling and bruising around the incisions.  Ice packs or heating pads (30-60 minutes up to 6 times a day) will help. Use ice for the first few days to help decrease swelling and bruising, then switch to heat to help relax tight/sore spots and speed recovery.  Some people prefer to use ice alone, heat alone, alternating between ice & heat.  Experiment to what works for you.  Swelling and bruising can take several weeks to resolve.   °c. It is helpful to take an over-the-counter pain medication regularly for the first few weeks.  Choose one of the following that works best for you: °i. Naproxen (Aleve, etc)  Two 220mg tabs twice a day °ii. Ibuprofen (Advil, etc) Three 200mg tabs four times a day (every meal & bedtime) °iii. Acetaminophen (Tylenol, etc) 500-650mg four times a day (every meal & bedtime) °d. A  prescription for pain medication (such as oxycodone, hydrocodone, etc) should be given to you upon discharge.  Take your pain medication as prescribed.  °i. If you are having problems/concerns with the prescription medicine (does not control pain, nausea, vomiting, rash, itching, etc), please call us [phone] to see if we need to switch you to a different pain medicine that will work better for you and/or control your side effect better. °ii. If you need a refill on your pain medication,  please contact your pharmacy.  They will contact our office to request authorization. Prescriptions will not be filled after 5 pm or on week-ends. °4. Avoid getting constipated.  Between the surgery and the pain medications, it is common to experience some constipation.  Increasing fluid intake and taking a fiber supplement (such as Metamucil, Citrucel, FiberCon, MiraLax, etc) 1-2 times a day regularly will usually help prevent this problem from occurring.  A mild laxative (prune juice, Milk of Magnesia, MiraLax, etc) should be taken according to package directions if there are no bowel movements after 48 hours.   °5. Watch out for diarrhea.  If you have many loose bowel movements, simplify your diet to bland foods & liquids for a few days.  Stop any stool softeners and decrease your fiber supplement.  Switching to mild anti-diarrheal medications (Kayopectate, Pepto Bismol) can help.  If this worsens or does not improve, please call us. °6. Wash / shower every day.  You may shower over the incision / wound.  Avoid baths until the skin is fully healed.  Continue to shower over incision(s) after the dressing is off. °7. Remove your waterproof bandages 5 days after surgery.  You may leave the incision open to air.  You may replace a dressing/Band-Aid to cover the incision for comfort if you wish. °8. ACTIVITIES as tolerated:   °a. You may resume regular (light) daily activities beginning the next day--such as daily self-care, walking, climbing stairs--gradually increasing activities as tolerated.  If you can   walk 30 minutes without difficulty, it is safe to try more intense activity such as jogging, treadmill, bicycling, low-impact aerobics, swimming, etc. b. Save the most intensive and strenuous activity for last such as sit-ups, heavy lifting, contact sports, etc  Refrain from any heavy lifting or straining until you are off narcotics for pain control.   c. DO NOT PUSH THROUGH PAIN.  Let pain be your guide: If it  hurts to do something, don't do it.  Pain is your body warning you to avoid that activity for another week until the pain goes down. d. You may drive when you are no longer taking prescription pain medication, you can comfortably wear a seatbelt, and you can safely maneuver your car and apply brakes. e. Bonita Quin may have sexual intercourse when it is comfortable.  9. FOLLOW UP in our office a. Please call [facility] at [phone] to set up an appointment to see your surgeon in the office for a follow-up appointment approximately 1-2 weeks after your surgery. b. Make sure that you call for this appointment the day you arrive home to insure a convenient appointment time. 10. IF YOU HAVE DISABILITY OR FAMILY LEAVE FORMS, BRING THEM TO THE OFFICE FOR PROCESSING.  DO NOT GIVE THEM TO YOUR DOCTOR.   WHEN TO CALL us [phone]: 1. Poor pain control 2. Reactions / problems with new medications (rash/itching, nausea, etc)  3. Fever over 101.5 F (38.5 C) 4. Inability to urinate 5. Nausea and/or vomiting 6. Worsening swelling or bruising 7. Continued bleeding from incision. 8. Increased pain, redness, or drainage from the incision  The clinic staff is available to answer your questions during regular business hours (8:30am-5pm).  Please don't hesitate to call and ask to speak to one of our nurses for clinical concerns.   A surgeon from [facility] is always on call at the hospitals   If you have a medical emergency, go to the nearest emergency room or call 911.    [facility] [address], [place], [place]  [address] ? MAIN: [phone] ? TOLL FREE: [phone] ? FAX [phone] [email or web address]  Foley Catheter Care, Adult A soft, flexible tube (Foley catheter) has been placed in your bladder. This may be done to temporarily help with urine drainage after an operation or to relieve blockage from an enlarged prostate gland. HOME  CARE INSTRUCTIONS  If you are going home with a Foley catheter in place, follow these instructions: Taking Care of the Catheter:  Keep the area where the catheter leaves your body clean.   Attach the catheter to the leg so there is no tension on the catheter.   Keep the drainage bag below the level of the bladder, but keep it OFF the floor.   Do not take long soaking baths. Your caregiver will give instructions about showering.   Wash your hands before touching ANYTHING related to the catheter or bag.   Using mild soap and warm water on a washcloth:   Clean the area closest to the catheter insertion site using a circular motion around the catheter.   Clean the catheter itself by wiping AWAY from the insertion site for several inches down the tube.   NEVER wipe upward as this could sweep bacteria up into the urethra (tube in your body that normally drains the bladder) and cause infection.  Taking Care of the Drainage Bags:  Two drainage bags will be taken home: a large overnight drainage  bag, and a smaller leg bag which fits underneath clothing.   It is okay to wear the overnight bag at any time, but NEVER wear the smaller leg bag at night.   Keep the drainage bag well below the level of your bladder. This prevents backflow of urine into the bladder and allows the urine to drain freely.   Anchor the tubing to your leg to prevent pulling or tension on the catheter. Use tape or a leg strap provided by the hospital.   Empty the drainage bag when it is  to  full. Wash your hands before and after touching the bag.   Periodically check the tubing for kinks to make sure there is no pressure on the tubing which could restrict the flow of urine.  Changing the Drainage Bags:  Cleanse both ends of the clean bag with alcohol before changing.   Pinch off the rubber catheter to avoid urine spillage during the disconnection.   Disconnect the dirty bag and connect the clean one.   Empty  the dirty bag carefully to avoid a urine spill.   Attach the new bag to the leg with tape or a leg strap.  Cleaning the Drainage Bags:  Whenever a drainage bag is disconnected, it must be cleaned quickly so it is ready for the next use.   Wash the bag in warm, soapy water.   Rinse the bag thoroughly with warm water.   Soak the bag for 30 minutes in a solution of white vinegar and water (1 cup vinegar to 1 quart warm water).   Rinse with warm water.  SEEK MEDICAL CARE IF:   Some pain develops in the kidney (lower back) area.   The urine is cloudy or smells bad.   There is some blood in the urine.   The catheter becomes clogged and/or there is no urine drainage.  SEEK IMMEDIATE MEDICAL CARE IF:   You have moderate or severe pain in the kidney region.   You start to throw up (vomit).   Blood fills the tube.   Worsening belly (abdominal) pain develops.   You have a fever.  MAKE SURE YOU:   Understand these instructions.   Will watch your condition.   Will get help right away if you are not doing well or get worse.  Document Released: 09/20/2005 Document Revised: 09/09/2011 Document Reviewed: 03/17/2007 [facility] Patient Information 2012 [place], [place].

## 2012-02-07 NOTE — Progress Notes (Signed)
Charles Parker [id number] [birth year]  CARE TEAM:  PCP: Pola Corn, MD, MD  Outpatient Care Team: Patient Care Team: Pola Corn, MD as PCP - General (Cardiology) Shirley Friar, MD as Consulting Physician (Gastroenterology)  Inpatient Treatment Team: Treatment Team: Attending Provider: Ardeth Sportsman, MD; Technician: Nevada Crane, NT; Registered Nurse: Eliezer Bottom, RN; Registered Nurse: Rometta Emery, RN; Registered Nurse: Sydell Axon, RN; Technician: Jacqulyn Ducking, NT; Registered Nurse: Dina Rich, RN  Subjective:  No major events Sore but PO pain medicines helping No N/V.  Hungry Walking "some"  Objective:  Vital signs:  Filed Vitals:   02/06/12 0630 02/06/12 1300 02/06/12 2142 02/07/12 0453  BP: 113/66 118/71 120/73 109/64  Pulse: 86 93 99 96  Temp: 98.3 F (36.8 C) 98.7 F (37.1 C) 98.5 F (36.9 C) 98.5 F (36.9 C)  TempSrc: Oral  Oral Oral  Resp: 18 20 20 20   Height:      Weight:      SpO2: 95% 93% 96% 100%    Last BM Date: 02/06/12  Intake/Output   Yesterday:  05/05 0701 - 05/06 0700 In: 2643.8 [P.O.:840; I.V.:1803.8] Out: 1410 [Urine:1225; Drains:185] This shift:  Total I/O In: 961.3 [I.V.:961.3] Out: 835 [Urine:675; Drains:160]  Bowel function:  Flatus: ? ("a little, I think")  BM: No  Physical Exam:  General: Pt awake/alert/oriented x4 in no acute distress Eyes: PERRL, normal EOM.  Sclera clear.  No icterus Neuro: CN II-XII intact w/o focal sensory/motor deficits. Lymph: No head/neck/groin lymphadenopathy Psych:  No delerium/psychosis/paranoia HENT: Normocephalic, Mucus membranes moist.  No thrush Neck: Supple, No tracheal deviation Chest: No chest wall pain w good excursion CV:  Pulses intact.  Regular rhythm Abdomen: Soft.  Nondistended.  Mildly tender at incisions only.  No incarcerated hernias.  Dressing clean.  Urine clear yellow in bag Ext:  SCDs BLE.  No mjr edema.  No  cyanosis Skin: No petechiae / purpurae  Results:   Labs: Results for orders placed during the hospital encounter of 02/03/12 (from the past 48 hour(s))  GLUCOSE, CAPILLARY     Status: Abnormal   Collection Time   02/05/12  8:03 AM      Component Value Range Comment   Glucose-Capillary 141 (*) 70 - 99 (mg/dL)    Comment 1 Documented in Chart      Comment 2 Notify RN     GLUCOSE, CAPILLARY     Status: Abnormal   Collection Time   02/05/12 11:59 AM      Component Value Range Comment   Glucose-Capillary 144 (*) 70 - 99 (mg/dL)    Comment 1 Documented in Chart      Comment 2 Notify RN     GLUCOSE, CAPILLARY     Status: Abnormal   Collection Time   02/05/12  5:17 PM      Component Value Range Comment   Glucose-Capillary 106 (*) 70 - 99 (mg/dL)    Comment 1 Notify RN     GLUCOSE, CAPILLARY     Status: Abnormal   Collection Time   02/05/12 10:30 PM      Component Value Range Comment   Glucose-Capillary 137 (*) 70 - 99 (mg/dL)   GLUCOSE, CAPILLARY     Status: Abnormal   Collection Time   02/06/12  7:44 AM      Component Value Range Comment   Glucose-Capillary 153 (*) 70 - 99 (mg/dL)   GLUCOSE, CAPILLARY     Status:  Abnormal   Collection Time   02/06/12 12:09 PM      Component Value Range Comment   Glucose-Capillary 199 (*) 70 - 99 (mg/dL)    Comment 1 Notify RN     GLUCOSE, CAPILLARY     Status: Abnormal   Collection Time   02/06/12  4:47 PM      Component Value Range Comment   Glucose-Capillary 123 (*) 70 - 99 (mg/dL)   GLUCOSE, CAPILLARY     Status: Abnormal   Collection Time   02/06/12  9:30 PM      Component Value Range Comment   Glucose-Capillary 124 (*) 70 - 99 (mg/dL)     Imaging / Studies: No results found.  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 Active Problems:  Hypertension  Diabetes mellitus type II   Assessment  Charles Parker  66 y.o. male  4 Days Post-Op  Procedure(s): LAPAROSCOPIC LOW ANTERIOR RESECTION PROCTOSCOPY BLADDER NECK REPAIR  Recovering   Plan: -f/u pathology - -solid diet  -D/C IVF -anti-ileus protocol -DM - SSI -HTN - ACE Inh/diuretic.  PRN metoprolol -Foley x 7 days & then check cystogram for bladder repair -VTE prophylaxis- SCDs, etc -mobilize as tolerated to help recovery  Anticipate D/C later today of continues to improve  Ardeth Sportsman, M.D., F.A.C.S. Gastrointestinal and Minimally Invasive Surgery [facility] [address] [place], [place] [address] [phone] Main / Paging [phone] Voice Mail   02/07/2012

## 2012-02-09 ENCOUNTER — Telehealth (INDEPENDENT_AMBULATORY_CARE_PROVIDER_SITE_OTHER): Payer: Self-pay | Admitting: General Surgery

## 2012-02-09 NOTE — Telephone Encounter (Signed)
Message copied by Liliana Cline on Wed Feb 09, 2012  2:02 PM ------      Message from: Delcie Roch      Created: Tue Feb 08, 2012  9:56 AM      Contact: [phone]       Elease Hashimoto:            Pt's brother, Ethelene Browns, called to check on order for cystogram.  Dr. Michaell Cowing put the order in at d/c from the hospital.            Please check and contact Ethelene Browns with the information.            Thanks.

## 2012-02-09 NOTE — Telephone Encounter (Signed)
Order in system for cystogram. Taken to referral coordinator to set up.

## 2012-02-09 NOTE — Telephone Encounter (Signed)
Patient made aware of path results. Will follow up at appt and call with any questions prior.  

## 2012-02-09 NOTE — Telephone Encounter (Signed)
Message copied by Liliana Cline on Wed Feb 09, 2012  9:08 AM ------      Message from: Ardeth Sportsman      Created: Tue Feb 08, 2012  4:56 PM       Tell pt the good news!  NO residual disease/cancer

## 2012-02-10 ENCOUNTER — Telehealth (INDEPENDENT_AMBULATORY_CARE_PROVIDER_SITE_OTHER): Payer: Self-pay | Admitting: General Surgery

## 2012-02-10 NOTE — Telephone Encounter (Signed)
Pt's brother calling to confirm date, time and location of appt on Friday for cystogram, as pt did not write it down.  Cystogram is scheduled for Friday, 02/11/12, at 10:30 (arrive 10:15) at [facility].  Brother given all information.

## 2012-02-11 ENCOUNTER — Telehealth (INDEPENDENT_AMBULATORY_CARE_PROVIDER_SITE_OTHER): Payer: Self-pay

## 2012-02-11 ENCOUNTER — Ambulatory Visit (HOSPITAL_COMMUNITY)
Admission: RE | Admit: 2012-02-11 | Discharge: 2012-02-11 | Disposition: A | Discharge status: Home / Self Care | Payer: Medicare Other | Source: Ambulatory Visit | Attending: Surgery | Admitting: Surgery

## 2012-02-11 DIAGNOSIS — D126 Benign neoplasm of colon, unspecified: Secondary | ICD-10-CM | POA: Insufficient documentation

## 2012-02-11 MED ORDER — DIATRIZOATE MEGLUMINE 30 % UR SOLN
Freq: Once | URETHRAL | Status: AC | PRN
  Administered 2012-02-11: 300 mL

## 2012-02-11 NOTE — Telephone Encounter (Signed)
Called pt and spoke to pt's brother Ethelene Browns about the foley being removed. The pt did get the foley removed after the cystogram. The pt has a f/u appt with Dr Michaell Cowing on 5/20.

## 2012-02-14 ENCOUNTER — Encounter (INDEPENDENT_AMBULATORY_CARE_PROVIDER_SITE_OTHER): Payer: Self-pay

## 2012-02-21 ENCOUNTER — Ambulatory Visit (INDEPENDENT_AMBULATORY_CARE_PROVIDER_SITE_OTHER): Payer: Medicare Other | Admitting: Surgery

## 2012-02-21 ENCOUNTER — Telehealth (INDEPENDENT_AMBULATORY_CARE_PROVIDER_SITE_OTHER): Payer: Self-pay | Admitting: Surgery

## 2012-02-21 ENCOUNTER — Encounter (INDEPENDENT_AMBULATORY_CARE_PROVIDER_SITE_OTHER): Payer: Self-pay | Admitting: Surgery

## 2012-02-21 VITALS — BP 152/90 | HR 94 | Temp 98.2°F | Resp 18 | Ht 72.0 in | Wt 196.2 lb

## 2012-02-21 DIAGNOSIS — D126 Benign neoplasm of colon, unspecified: Secondary | ICD-10-CM

## 2012-02-21 MED ORDER — OXYCODONE HCL 5 MG PO TABS: 5.0000 mg | ORAL_TABLET | Freq: Four times a day (QID) | ORAL | Status: DC | PRN

## 2012-02-21 NOTE — Progress Notes (Signed)
Subjective:     Patient ID: Charles Parker, male   DOB: 1946/03/06, 66 y.o.   MRN: [id number]  HPI  RITA PROM  December 24, 1945 [id number]  Patient Care Team: Pola Corn, MD as PCP - General (Cardiology) Shirley Friar, MD as Consulting Physician (Gastroenterology)  This patient is a 66 y.o.male who presents today for surgical evaluation.   Procedure: Laparoscopic low anterior resection 02/03/2012  Reason for visit: Followup.  Patient comes today with a brother. He gradually been improving until a few days ago. He started to feel a little more tired. Appetite down. Has not had a bowel movement for several days. +flatus.  Wonders if it is safe to take a laxative. He's not been taking any fiber regimen.  No fevers/chills.  Using oxycodone only one pill about every 4-6 hours.  Nothing else for pain.  He had a cystogram which showed no leak. The Foley catheter was removed. He's currently urinating normally. No urgency or frequency. The pelvic drain has thinned down onto clear yellow fluid. No pus. Output slowly tapering down.  He is occasionally had some elevated glucose is 200-300 but for the most part it has been less than 200.  Patient Active Problem List  Diagnoses  . 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  Marland Kitchen Hypertension  . Diabetes mellitus type II    Past Medical History  Diagnosis Date  . Hypertension   . Diabetes mellitus     type II  . Tubulovillous adenoma   . Hypercholesterolemia   . Cancer     Past Surgical History  Procedure Date  . Knee surgery 2004    left  . Colonoscopy   . Proctoscopy 02/03/2012    Procedure: PROCTOSCOPY;  Surgeon: Ardeth Sportsman, MD;  Location: [facility];  Service: General;;  . Bladder neck reconstruction 02/03/2012    Procedure: BLADDER NECK REPAIR;  Surgeon: Ardeth Sportsman, MD;  Location: [facility];  Service: General;;    History   Social History  . Marital  Status: Widowed    Spouse Name: N/A    Number of Children: N/A  . Years of Education: N/A   Occupational History  . Not on file.   Social History Main Topics  . Smoking status: Never Smoker   . Smokeless tobacco: Never Used  . Alcohol Use: No  . Drug Use: No  . Sexually Active: Not Currently   Other Topics Concern  . Not on file   Social History Narrative  . No narrative on file    History reviewed. No pertinent family history.  Current Outpatient Prescriptions  Medication Sig Dispense Refill  . glimepiride (AMARYL) 2 MG tablet Take 2 mg by mouth daily before breakfast.      . metFORMIN (GLUCOPHAGE) 1000 MG tablet Take 1,000 mg by mouth daily with breakfast.      . oxyCODONE (OXY IR/ROXICODONE) 5 MG immediate release tablet       . simvastatin (ZOCOR) 20 MG tablet Take 20 mg by mouth daily with breakfast.      . valsartan-hydrochlorothiazide (DIOVAN-HCT) 320-25 MG per tablet Take 1 tablet by mouth daily with breakfast.         Allergies  Allergen Reactions  . Codeine Rash    All over the body    BP 152/90  Pulse 94  Temp(Src) 98.2 F (36.8 C) (Temporal)  Resp 18  Ht 6' (1.829 m)  Wt 196 lb  3.2 oz (88.996 kg)  BMI 26.61 kg/m2  Dg Chest 2 View  01/27/2012  *RADIOLOGY REPORT*  Clinical Data: Preoperative evaluation for partial colectomy laproscopic bleed.  No current chest complaints.  Hypertension treated medically  CHEST - 2 VIEW  Comparison:  05/21/2009  Findings: Low lung volumes are present and taking this into consideration heart and mediastinal contours are within normal limits.  The lung fields demonstrate some crowding of bronchovascular markings at both lung bases and are otherwise clear with no signs of focal infiltrate or congestive failure evident. No pleural fluid or significant peribronchial cuffing is seen.  Bony structures appear intact.  IMPRESSION: Stable cardiopulmonary appearance with no new focal or acute abnormality seen.  Original Report  Authenticated By: Bertha Stakes, M.D.   Dg Cystogram  02/11/2012  *RADIOLOGY REPORT*  Clinical Data: Status post bladder injury and repair.  CYSTOGRAM  Technique:  After catheterization of the urinary bladder following sterile technique the bladder was filled with 300 cc Cysto-Hypaque 30% by drip infusion.  Serial spot images were obtained during bladder filling and post draining.  Fluoroscopy Time: 1.18 minutes  Comparison:  None.  Findings: Filling of the bladder demonstrates a small caliber urinary bladder.  There is a mild smooth impression and indentation on the posterior superior bladder wall, possibly related postoperative changes.  I see no contrast extravasation to suggest leak.  IMPRESSION: No evidence of urinary leak from the bladder.  Original Report Authenticated By: Cyndie Chime, M.D.     Review of Systems  Constitutional: Positive for fatigue. Negative for fever, chills and diaphoresis.  HENT: Negative for sore throat, trouble swallowing and neck pain.   Eyes: Negative for photophobia and visual disturbance.  Respiratory: Negative for choking and shortness of breath.   Cardiovascular: Negative for chest pain and palpitations.  Gastrointestinal: Positive for abdominal pain and constipation. Negative for nausea, vomiting, diarrhea, abdominal distention, anal bleeding and rectal pain.  Genitourinary: Positive for urgency and difficulty urinating. Negative for dysuria and testicular pain.  Musculoskeletal: Negative for myalgias, arthralgias and gait problem.  Skin: Negative for color change and rash.  Neurological: Negative for dizziness, speech difficulty, weakness and numbness.  Hematological: Negative for adenopathy.  Psychiatric/Behavioral: Negative for hallucinations, confusion and agitation.       Objective:   Physical Exam  Constitutional: He is oriented to person, place, and time. He appears well-developed and well-nourished. No distress.  HENT:  Head:  Normocephalic.  Mouth/Throat: Oropharynx is clear and moist. No oropharyngeal exudate.  Eyes: Conjunctivae and EOM are normal. Pupils are equal, round, and reactive to light. No scleral icterus.  Neck: Normal range of motion. No tracheal deviation present.  Cardiovascular: Normal rate, normal heart sounds and intact distal pulses.   Pulmonary/Chest: Effort normal. No respiratory distress.  Abdominal: Soft. He exhibits no distension and no mass. There is no rebound and no guarding. Hernia confirmed negative in the right inguinal area and confirmed negative in the left inguinal area.       Incisions clean with normal healing ridges.  No hernias.  Mild/mod lower abdominal discomfort.  No peritoneal signs  Musculoskeletal: Normal range of motion. He exhibits no tenderness.  Neurological: He is alert and oriented to person, place, and time. No cranial nerve deficit. He exhibits normal muscle tone. Coordination normal.  Skin: Skin is warm and dry. No rash noted. He is not diaphoretic.  Psychiatric: He has a normal mood and affect. His behavior is normal.       Assessment:  POD # 18 s/p lap LAR with decreased appetite, pain, constipation.    Plan:     CBC, BMET, CT scan to r/o abscess / SBO  Increase activity as tolerated.  Do not push through pain.  Okay to use narcotics as needed.  Try to maximize non-narcotic use.  The contrast did just been doing the narcotics only. I again went over the importance of nonnarcotic pain control.  Again gave him another Handout  Advanced on diet as tolerated. Bowel regimen to avoid problems.  I did give him another handout  Return to clinic 2 weeks. The patient expressed understanding and appreciation

## 2012-02-21 NOTE — Patient Instructions (Signed)

## 2012-02-22 ENCOUNTER — Ambulatory Visit
Admission: RE | Admit: 2012-02-22 | Discharge: 2012-02-22 | Disposition: A | Discharge status: Home / Self Care | Payer: Medicare Other | Source: Ambulatory Visit | Attending: Surgery | Admitting: Surgery

## 2012-02-22 ENCOUNTER — Telehealth (INDEPENDENT_AMBULATORY_CARE_PROVIDER_SITE_OTHER): Payer: Self-pay | Admitting: Surgery

## 2012-02-22 ENCOUNTER — Other Ambulatory Visit (INDEPENDENT_AMBULATORY_CARE_PROVIDER_SITE_OTHER): Payer: Self-pay | Admitting: Surgery

## 2012-02-22 DIAGNOSIS — D126 Benign neoplasm of colon, unspecified: Secondary | ICD-10-CM

## 2012-02-22 DIAGNOSIS — R188 Other ascites: Secondary | ICD-10-CM | POA: Insufficient documentation

## 2012-02-22 LAB — CBC WITH DIFFERENTIAL/PLATELET
Basophils Absolute: 0.1 10*3/uL (ref 0.0–0.1)
Eosinophils Relative: 0 % (ref 0–5)
Lymphocytes Relative: 9 % — ABNORMAL LOW (ref 12–46)
Lymphs Abs: 1.1 10*3/uL (ref 0.7–4.0)
MCV: 87.6 fL (ref 78.0–100.0)
Neutro Abs: 10.2 10*3/uL — ABNORMAL HIGH (ref 1.7–7.7)
Neutrophils Relative %: 81 % — ABNORMAL HIGH (ref 43–77)
Platelets: 302 10*3/uL (ref 150–400)
RBC: 4.1 MIL/uL — ABNORMAL LOW (ref 4.22–5.81)
RDW: 12.8 % (ref 11.5–15.5)
WBC: 12.5 10*3/uL — ABNORMAL HIGH (ref 4.0–10.5)

## 2012-02-22 MED ORDER — AMOXICILLIN-POT CLAVULANATE 875-125 MG PO TABS: 1.0000 | ORAL_TABLET | Freq: Two times a day (BID) | ORAL | Status: AC

## 2012-02-22 MED ORDER — IOHEXOL 300 MG/ML  SOLN
100.0000 mL | Freq: Once | INTRAMUSCULAR | Status: AC | PRN
  Administered 2012-02-22: 100 mL via INTRAVENOUS

## 2012-02-22 NOTE — Progress Notes (Signed)
I printed order and took to [facility] to get scheduled for tomorrow.

## 2012-02-22 NOTE — Telephone Encounter (Signed)
Patient feels much better today.  Several BMs & appetite better.  No fevers / N/ V.  Pain down.    CT scan today shows small gas/fluid pocket suspicious for abscess/leak.  Could be just remnant of pelvic drain, but need to be more certain.  I will start him on PO Augmentin & requested a CT-guided placement of a drain into the gas/fluid collection.  If worse, admit, IV ABX, gastrograffin enema, possible loop ileostomy for fecal diversion.  If better & drain / aspiration not concerning nor any leak, follow closely.  Pt agreed with this plan.

## 2012-02-23 ENCOUNTER — Encounter (HOSPITAL_COMMUNITY): Payer: Self-pay

## 2012-02-23 ENCOUNTER — Telehealth (INDEPENDENT_AMBULATORY_CARE_PROVIDER_SITE_OTHER): Payer: Self-pay

## 2012-02-23 ENCOUNTER — Ambulatory Visit (HOSPITAL_COMMUNITY)
Admission: RE | Admit: 2012-02-23 | Discharge: 2012-02-23 | Disposition: A | Discharge status: Home / Self Care | Payer: Medicare Other | Source: Ambulatory Visit | Attending: Surgery | Admitting: Surgery

## 2012-02-23 DIAGNOSIS — Z79899 Other long term (current) drug therapy: Secondary | ICD-10-CM | POA: Insufficient documentation

## 2012-02-23 DIAGNOSIS — I10 Essential (primary) hypertension: Secondary | ICD-10-CM | POA: Insufficient documentation

## 2012-02-23 DIAGNOSIS — R188 Other ascites: Secondary | ICD-10-CM | POA: Insufficient documentation

## 2012-02-23 DIAGNOSIS — E7800 Pure hypercholesterolemia, unspecified: Secondary | ICD-10-CM | POA: Insufficient documentation

## 2012-02-23 DIAGNOSIS — E119 Type 2 diabetes mellitus without complications: Secondary | ICD-10-CM | POA: Insufficient documentation

## 2012-02-23 LAB — GLUCOSE, CAPILLARY: Glucose-Capillary: 183 mg/dL — ABNORMAL HIGH (ref 70–99)

## 2012-02-23 MED ORDER — SODIUM CHLORIDE 0.9 % IV SOLN
Freq: Once | INTRAVENOUS | Status: AC
  Administered 2012-02-23: 500 mL via INTRAVENOUS

## 2012-02-23 MED ORDER — MIDAZOLAM HCL 2 MG/2ML IJ SOLN
INTRAMUSCULAR | Status: AC
  Filled 2012-02-23: qty 6

## 2012-02-23 MED ORDER — FENTANYL CITRATE 0.05 MG/ML IJ SOLN
INTRAMUSCULAR | Status: AC
  Filled 2012-02-23: qty 6

## 2012-02-23 MED ORDER — MIDAZOLAM HCL 5 MG/5ML IJ SOLN
INTRAMUSCULAR | Status: AC | PRN
  Administered 2012-02-23: 2 mg via INTRAVENOUS
  Administered 2012-02-23: 1 mg via INTRAVENOUS

## 2012-02-23 MED ORDER — FENTANYL CITRATE 0.05 MG/ML IJ SOLN
INTRAMUSCULAR | Status: AC | PRN
  Administered 2012-02-23: 100 ug via INTRAVENOUS
  Administered 2012-02-23: 50 ug via INTRAVENOUS

## 2012-02-23 NOTE — H&P (Signed)
Chief Complaint: Pelvic abscess HPI: Charles Parker is an 66 y.o. male who underwent lap LAR a few weeks ago. Has now developed a deep pelvic abscess. Dr. Michaell Cowing has requested drain placement if possible.  Past Medical History:  Past Medical History  Diagnosis Date  . Hypertension   . Diabetes mellitus     type II  . Tubulovillous adenoma   . Hypercholesterolemia   . Cancer     Past Surgical History:  Past Surgical History  Procedure Date  . Knee surgery 2004    left  . Colonoscopy   . Proctoscopy 02/03/2012    Procedure: PROCTOSCOPY;  Surgeon: Ardeth Sportsman, MD;  Location: [facility];  Service: General;;  . Bladder neck reconstruction 02/03/2012    Procedure: BLADDER NECK REPAIR;  Surgeon: Ardeth Sportsman, MD;  Location: [facility];  Service: General;;    Family History: History reviewed. No pertinent family history.  Social History:  reports that he has never smoked. He has never used smokeless tobacco. He reports that he does not drink alcohol or use illicit drugs.  Allergies:  Allergies  Allergen Reactions  . Codeine Rash    All over the body    Medications: .  glimepiride (AMARYL) 2 MG tablet  Take 2 mg by mouth daily before breakfast.  .  metFORMIN (GLUCOPHAGE) 1000 MG tablet  Take 1,000 mg by mouth daily with breakfast.  .  oxyCODONE (OXY IR/ROXICODONE) 5 MG immediate release tablet  .  simvastatin (ZOCOR) 20 MG tablet  Take 20 mg by mouth daily with breakfast.  .  valsartan-hydrochlorothiazide (DIOVAN-HCT) 320-25 MG per tablet  Take 1 tablet by mouth daily with breakfast.    Please HPI for pertinent positives, otherwise complete 10 system ROS negative.  Physical Exam: Blood pressure 135/90, pulse 74, temperature 98 F (36.7 C), temperature source Oral, resp. rate 14, SpO2 98.00%. There is no height or weight on file to calculate BMI.   General Appearance:  Alert, cooperative, no distress, appears stated age  Head:  Normocephalic, without obvious  abnormality, atraumatic  ENT: Unremarkable  Neck: Supple, symmetrical, trachea midline, no adenopathy, thyroid: not enlarged, symmetric, no tenderness/mass/nodules  Lungs:   Clear to auscultation bilaterally, no w/r/r, respirations unlabored without use of accessory muscles.  Chest Wall:  No tenderness or deformity  Heart:  Regular rate and rhythm, S1, S2 normal, no murmur, rub or gallop. Carotids 2+ without bruit.  Abdomen:   Soft, non-tender, non distended. Surgical scar dressing intact.  Extremities: Extremities normal, atraumatic, no cyanosis or edema  Pulses: 2+ and symmetric  Skin: Skin color, texture, turgor normal, no rashes or lesions  Neurologic: Normal affect, no gross deficits.   Results for orders placed in visit on 02/21/12 (from the past 48 hour(s))  CBC WITH DIFFERENTIAL     Status: Abnormal   Collection Time   02/21/12  3:10 PM      Component Value Range Comment   WBC 12.5 (*) 4.0 - 10.5 (K/uL)    RBC 4.10 (*) 4.22 - 5.81 (MIL/uL)    Hemoglobin 12.2 (*) 13.0 - 17.0 (g/dL)    HCT 40.9 (*) 81.1 - 52.0 (%)    MCV 87.6  78.0 - 100.0 (fL)    MCH 29.8  26.0 - 34.0 (pg)    MCHC 34.0  30.0 - 36.0 (g/dL)    RDW 91.4  78.2 - 95.6 (%)    Platelets 302  150 - 400 (K/uL)    Neutrophils Relative 81 (*)  43 - 77 (%)    Neutro Abs 10.2 (*) 1.7 - 7.7 (K/uL)    Lymphocytes Relative 9 (*) 12 - 46 (%)    Lymphs Abs 1.1  0.7 - 4.0 (K/uL)    Monocytes Relative 9  3 - 12 (%)    Monocytes Absolute 1.1 (*) 0.1 - 1.0 (K/uL)    Eosinophils Relative 0  0 - 5 (%)    Eosinophils Absolute 0.0  0.0 - 0.7 (K/uL)    Basophils Relative 1  0 - 1 (%)    Basophils Absolute 0.1  0.0 - 0.1 (K/uL)    Smear Review     Differential confirmed by smear review.   Ct Abdomen Pelvis W Contrast  02/22/2012  *RADIOLOGY REPORT*  Clinical Data: Abdominal pain.  History of surgery.  Evaluate for possible ulcer.  CT ABDOMEN AND PELVIS WITH CONTRAST  Technique:  Multidetector CT imaging of the abdomen and pelvis was  performed following the standard protocol during bolus administration of intravenous contrast.  Contrast: OMNIPAQUE IOHEXOL 300 MG/ML  SOLN  Comparison: None  Findings: The lung bases are clear except for basilar scarring and minimal right middle lobe atelectasis.  No pulmonary nodules or pleural effusion.  The liver is unremarkable.  A small low attenuation lesion in the left hepatic lobe is an indeterminate finding and needs follow-up. No intrahepatic biliary dilatation.  The gallbladder is normal.  No common bile duct dilatation.  The pancreas is normal.  The spleen is normal in size.  No focal lesions.  The adrenal glands are unremarkable.  There are multiple bilateral renal cyst.  No worrisome renal mass lesions or hydronephrosis.  The stomach, duodenum, small bowel and colon are unremarkable.  No inflammatory changes or mass lesions.  There are surgical changes at the rectosigmoid junction with anastomoses sutures.  There is an anastomotic leak with a small presacral space abscess measuring approximately 3 x 3.5 cm.  The leak appears to be from the right posterior aspect of the anastomoses at approximately the seven to eight o'clock position.  There is moderate wall thickening involving the bladder without discrete mass.  This could be due to chronic inflammation.  Small pelvic lymph nodes are noted but no adenopathy.  The prostate gland is enlarged.  There are postoperative changes from a prior colostomy.  The appendix is normal.  The bony structures are intact.  IMPRESSION:  1.  CT findings consistent with an anastomotic leak at the rectosigmoid junction with a small, 3.5 x 3.0 cm, presacral abscess. 2.  Small, low attenuation liver lesion is likely a benign finding but recommend attention on future scans.  Original Report Authenticated By: P. Loralie Champagne, M.D.    Assessment/Plan Pelvic abscess post LAR Plan CT guided drainage. Discussed procedure and risks. Consent obtained and  signed.  Brayton El PA-C 02/23/2012, 11:51 AM

## 2012-02-23 NOTE — Progress Notes (Signed)
1530 Radiology PA her to assess patient for discharge.

## 2012-02-23 NOTE — Procedures (Signed)
CT guided 71F pigtail into presacral collection Scant purulent material out, sent for GS, C&S No complication No blood loss. See complete dictation in [facility].

## 2012-02-23 NOTE — Discharge Instructions (Signed)
Ascites  Ascites is a gathering of fluid in the belly (abdomen). This is most often caused by liver disease. It may also be caused by a number of other less common problems. It causes a ballooning out (distension) of the abdomen.  CAUSES   Scarring of the liver (cirrhosis) is the most common cause of ascites. Other causes include:   Infection or inflammation in the abdomen.   Cancer in the abdomen.   Heart failure.   Certain forms of kidney failure (nephritic syndrome).   Inflammation of the pancreas.   Clots in the veins of the liver.  SYMPTOMS   In the early stages of ascites, you may not have any symptoms. The main symptom of ascites is a sense of abdominal bloating. This is due to the presence of fluid. This may also cause an increase in abdominal or waist size. People with this condition can develop swelling in the legs, and men can develop a swollen scrotum. When there is a lot of fluid, it may be hard to breath. Stretching of the abdomen by fluid can be painful.  DIAGNOSIS   Certain features of your medical history, such as a history of liver disease and of an enlarging abdomen, can suggest the presence of ascites. The diagnosis of ascites can be made on physical exam by your caregiver. An abdominal ultrasound examination can confirm that ascites is present, and estimate the amount of fluid.  Once ascites is confirmed, it is important to determine its cause. Again, a history of one of the conditions listed in "CAUSES" provides a strong clue. A physical exam is important, and blood and X-ray tests may be needed. During a procedure called paracentesis, a sample of fluid is removed from the abdomen. This can determine certain key features about the fluid, such as whether or not infection or cancer is present. Your caregiver will determine if a paracentesis is necessary. They will describe the procedure to you.  PREVENTION   Ascites is a complication of other conditions. Therefore to prevent ascites, you  must seek treatment for any significant health conditions you have. Once ascites is present, careful attention to fluid and salt intake may help prevent it from getting worse. If you have ascites, you should not drink alcohol.  PROGNOSIS   The prognosis of ascites depends on the underlying disease. If the disease is reversible, such as with certain infections or with heart failure, then ascites may improve or disappear. When ascites is caused by cirrhosis, then it indicates that the liver disease has worsened, and further evaluation and treatment of the liver disease is needed. If your ascites is caused by cancer, then the success or failure of the cancer treatment will determine whether your ascites will improve or worsen.  RISKS AND COMPLICATIONS   Ascites is likely to worsen if it is not properly diagnosed and treated. A large amount of ascites can cause pain and difficulty breathing. The main complication, besides worsening, is infection (called spontaneous bacterial peritonitis). This requires prompt treatment.  TREATMENT   The treatment of ascites depends on its cause. When liver disease is your cause, medical management using water pills (diuretics) and decreasing salt intake is often effective. Ascites due to peritoneal inflammation or malignancy (cancer) alone does not respond to salt restriction and diuretics. Hospitalization is sometimes required.  If the treatment of ascites cannot be managed with medications, a number of other treatments are available. Your caregivers will help you decide which will work best for you. Some   is passed into a vein (peritoneovenous shunting).   Liver transplantation.   Transjugular intrahepatic portosystemic stent shunt.  HOME CARE INSTRUCTIONS  It is important to monitor body weight and the intake and output of fluids. Weigh yourself at the same time every day. Record  your weights. Fluid restriction may be necessary. It is also important to know your salt intake. The more salt you take in, the more fluid you will retain. Ninety percent of people with ascites respond to this approach.  Follow any directions for medicines carefully.   Follow up with your caregiver, as directed.   Report any changes in your health, especially any new or worsening symptoms.   If your ascites is from liver disease, avoid alcohol and other substances toxic to the liver.  SEEK MEDICAL CARE IF:   Your weight increases more than a few pounds in a few days.   Your abdominal or waist size increases.   You develop swelling in your legs.   You had swelling and it worsens.  SEEK IMMEDIATE MEDICAL CARE IF:   You develop a fever.   You develop new abdominal pain.   You develop difficulty breathing.   You develop confusion.   You have bleeding from the mouth, stomach, or rectum.  MAKE SURE YOU:   Understand these instructions.   Will watch your condition.   Will get help right away if you are not doing well or get worse.  Document Released: 09/20/2005 Document Revised: 09/09/2011 Document Reviewed: 04/21/2007 [facility] Patient Information 2012 [place], [place].Paracentesis Paracentesis is a procedure used to remove excess fluid from the belly (abdomen). Excess fluid in the belly is called ascites. Excess fluid can be the result of certain conditions, such as infection, inflammation, abdominal injury, heart failure, chronic scarring of the liver (cirrhosis), or cancer. The excess fluid is removed using a needle inserted through the skin and tissue into the abdomen.  A paracentesis may be done to:  Determine the cause of the excess fluid through examination of the fluid.   Relieve symptoms of shortness of breath or pain caused by the excess fluid.   Determine presence of bleeding after an abdominal injury.  LET YOUR CAREGIVERS KNOW ABOUT:  Allergies.   Medications  taken including herbs, eye drops, over-the-counter medications, and creams.   Use of steroids (by mouth or creams).   Previous problems with anesthetics or numbing medicine.   Possibility of pregnancy, if this applies.   History of blood clots (thrombophlebitis).   History of bleeding or blood problems.   Previous surgery.   Other health problems.  RISKS AND COMPLICATIONS  Injury to an abdominal organ, such as the bowel (large intestine), liver, spleen, or bladder.   Possible infection.   Bleeding.   Low blood pressure (hypotension).  BEFORE THE PROCEDURE This is a procedure that can be done as an outpatient. Confirm the time that you need to arrive for your procedure. A blood sample may be done to determine your blood clotting time. The presence of a severe bleeding disorder (coagulopathy) which cannot be promptly corrected may make this procedure inadvisable. You may be asked to urinate. PROCEDURE The procedure will take about 30 minutes. This time will vary depending on the amount of fluid that is removed. You may be asked to lie on your back with your head elevated. An area on your abdomen will be cleansed. A numbing medicine may then be injected (local anesthesia) into the skin and tissue. A needle is inserted through your  abdominal skin and tissues until it is positioned in your abdomen. You may feel pressure or slight pain as the needle is positioned into the abdomen. Fluid is removed from the abdomen through the needle. Tell your caregiver if you feel dizzy or lightheaded. The needle is withdrawn once the desired amount of fluid has been removed. A sample of the fluid may be sent for examination.  AFTER THE PROCEDURE Your recovery will be assessed and monitored. If there are no problems, as an outpatient, you should be able to go home shortly after the procedure. There may be a very limited amount of clear fluid draining from the needle insertion site over the next 2 days.  Confirm with your caregiver as to the expected amount of drainage. Obtaining the Test Results It is your responsibility to obtain your test results. Do not assume everything is normal if you have not heard from your caregiver or the medical facility. It is important for you to follow up on all of your test results. HOME CARE INSTRUCTIONS   You may resume normal diet and activities as directed or allowed.   Only take over-the-counter or prescription medicines for pain, discomfort, or fever as directed by your caregiver.  SEEK IMMEDIATE MEDICAL CARE IF:  You develop shortness of breath or chest pain.   You develop increasing pain, discomfort, or swelling in your abdomen.   You develop new drainage or pus coming from site where fluid was removed.   You develop swelling or increased redness from site where fluid was removed.   You develop an unexplained temperature of 102 F (38.9 C) or above.  Document Released: 04/05/2005 Document Revised: 09/09/2011 Document Reviewed: 05/12/2009 [facility] Patient Information 2012 [place], [place].Paracentesis Paracentesis is a procedure used to remove excess fluid from the belly (abdomen). Excess fluid in the belly is called ascites. Excess fluid can be the result of certain conditions, such as infection, inflammation, abdominal injury, heart failure, chronic scarring of the liver (cirrhosis), or cancer. The excess fluid is removed using a needle inserted through the skin and tissue into the abdomen.  A paracentesis may be done to:  Determine the cause of the excess fluid through examination of the fluid.   Relieve symptoms of shortness of breath or pain caused by the excess fluid.   Determine presence of bleeding after an abdominal injury.  LET YOUR CAREGIVERS KNOW ABOUT:  Allergies.   Medications taken including herbs, eye drops, over-the-counter medications, and creams.   Use of steroids (by mouth or creams).   Previous problems with anesthetics  or numbing medicine.   Possibility of pregnancy, if this applies.   History of blood clots (thrombophlebitis).   History of bleeding or blood problems.   Previous surgery.   Other health problems.  RISKS AND COMPLICATIONS  Injury to an abdominal organ, such as the bowel (large intestine), liver, spleen, or bladder.   Possible infection.   Bleeding.   Low blood pressure (hypotension).  BEFORE THE PROCEDURE This is a procedure that can be done as an outpatient. Confirm the time that you need to arrive for your procedure. A blood sample may be done to determine your blood clotting time. The presence of a severe bleeding disorder (coagulopathy) which cannot be promptly corrected may make this procedure inadvisable. You may be asked to urinate. PROCEDURE The procedure will take about 30 minutes. This time will vary depending on the amount of fluid that is removed. You may be asked to lie on your back with  your head elevated. An area on your abdomen will be cleansed. A numbing medicine may then be injected (local anesthesia) into the skin and tissue. A needle is inserted through your abdominal skin and tissues until it is positioned in your abdomen. You may feel pressure or slight pain as the needle is positioned into the abdomen. Fluid is removed from the abdomen through the needle. Tell your caregiver if you feel dizzy or lightheaded. The needle is withdrawn once the desired amount of fluid has been removed. A sample of the fluid may be sent for examination.  AFTER THE PROCEDURE Your recovery will be assessed and monitored. If there are no problems, as an outpatient, you should be able to go home shortly after the procedure. There may be a very limited amount of clear fluid draining from the needle insertion site over the next 2 days. Confirm with your caregiver as to the expected amount of drainage. Obtaining the Test Results It is your responsibility to obtain your test results. Do not  assume everything is normal if you have not heard from your caregiver or the medical facility. It is important for you to follow up on all of your test results. HOME CARE INSTRUCTIONS   You may resume normal diet and activities as directed or allowed.   Only take over-the-counter or prescription medicines for pain, discomfort, or fever as directed by your caregiver.  SEEK IMMEDIATE MEDICAL CARE IF:  You develop shortness of breath or chest pain.   You develop increasing pain, discomfort, or swelling in your abdomen.   You develop new drainage or pus coming from site where fluid was removed.   You develop swelling or increased redness from site where fluid was removed.   You develop an unexplained temperature of 102 F (38.9 C) or above.  Document Released: 04/05/2005 Document Revised: 09/09/2011 Document Reviewed: 05/12/2009 [facility] Patient Information 2012 [place], [place].

## 2012-02-23 NOTE — Telephone Encounter (Signed)
Called pt's brother to see if pt received the antibiotics that Dr Michaell Cowing prescribed last night and they have not gone to p/u the Rx. They are at [facility] now waiting on the pt to get his procedure done and will go get the Rx today. The brother will call me if any questions.

## 2012-02-24 ENCOUNTER — Telehealth (INDEPENDENT_AMBULATORY_CARE_PROVIDER_SITE_OTHER): Payer: Self-pay

## 2012-02-24 NOTE — Telephone Encounter (Signed)
Called to check on pt after getting drain placed at the hospital yesterday. The pt is doing better today. The pt has eaten breakfast and lunch today. The pt is taking his antibiotics. The drainage that is coming out of the drain is a redish yellow color. The homehealth nurse came by to see the pt today. The pt has f/u appt with Dr Michaell Cowing on 5/30. I advised for them to call if any questions.

## 2012-02-26 LAB — CULTURE, ROUTINE-ABSCESS

## 2012-02-28 LAB — ANAEROBIC CULTURE

## 2012-03-02 ENCOUNTER — Encounter (INDEPENDENT_AMBULATORY_CARE_PROVIDER_SITE_OTHER): Payer: Self-pay | Admitting: Surgery

## 2012-03-02 ENCOUNTER — Ambulatory Visit (INDEPENDENT_AMBULATORY_CARE_PROVIDER_SITE_OTHER): Payer: Medicare Other | Admitting: Surgery

## 2012-03-02 VITALS — BP 144/92 | HR 100 | Temp 97.4°F | Resp 14 | Ht 72.0 in | Wt 196.8 lb

## 2012-03-02 DIAGNOSIS — D126 Benign neoplasm of colon, unspecified: Secondary | ICD-10-CM

## 2012-03-02 DIAGNOSIS — R188 Other ascites: Secondary | ICD-10-CM

## 2012-03-02 NOTE — Progress Notes (Signed)
Subjective:     Patient ID: DIOR STEPTER, male   DOB: [birth year], 66 y.o.   MRN: [id number]  HPI  SRIKAR CHIANG  Nov 13, 1945 [id number]  Patient Care Team: Pola Corn, MD as PCP - General (Cardiology) Shirley Friar, MD as Consulting Physician (Gastroenterology)  This patient is a 66 y.o.male who presents today for surgical evaluation.   Procedure: Laparoscopic rectosigmoid low anterior resection with anastomosis. Bladder repair. 02/03/2012  Pathology: No evidence of adenomatous changes remaining in stump of polypectomy site.  The patient comes today with his brother. He feels well. No nausea or vomiting. Energy good. Eating well. 1-2 bowel movements a day. Taking the oral antibiotics (Augmentin) he had a drain placed percutaneously. He tolerated that well. Notes the drainage has been minimal. Not flushing it.  Patient Active Problem List  Diagnoses  . 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  Marland Kitchen Hypertension  . Diabetes mellitus type II  . Pelvic fluid collection, possible abscess/ rectal anastomosis leak    Past Medical History  Diagnosis Date  . Hypertension   . Diabetes mellitus     type II  . Tubulovillous adenoma   . Hypercholesterolemia   . Cancer     Past Surgical History  Procedure Date  . Knee surgery 2004    left  . Colonoscopy   . Proctoscopy 02/03/2012    Procedure: PROCTOSCOPY;  Surgeon: Ardeth Sportsman, MD;  Location: [facility];  Service: General;;  . Bladder neck reconstruction 02/03/2012    Procedure: BLADDER NECK REPAIR;  Surgeon: Ardeth Sportsman, MD;  Location: [facility];  Service: General;;    History   Social History  . Marital Status: Widowed    Spouse Name: N/A    Number of Children: N/A  . Years of Education: N/A   Occupational History  . Not on file.   Social History Main Topics  . Smoking status: Never Smoker   . Smokeless tobacco: Never Used  . Alcohol Use: No   . Drug Use: No  . Sexually Active: Not Currently   Other Topics Concern  . Not on file   Social History Narrative  . No narrative on file    History reviewed. No pertinent family history.  Current Outpatient Prescriptions  Medication Sig Dispense Refill  . acetaminophen (TYLENOL) 500 MG tablet Take 500 mg by mouth every 4 (four) hours as needed.      Marland Kitchen amoxicillin-clavulanate (AUGMENTIN) 875-125 MG per tablet Take 1 tablet by mouth 2 (two) times daily.  14 tablet  3  . glimepiride (AMARYL) 2 MG tablet Take 2 mg by mouth daily before breakfast.      . metFORMIN (GLUCOPHAGE) 1000 MG tablet Take 1,000 mg by mouth daily with breakfast.      . oxyCODONE (OXY IR/ROXICODONE) 5 MG immediate release tablet Take 1-2 tablets (5-10 mg total) by mouth every 6 (six) hours as needed for pain.  40 tablet  0  . simvastatin (ZOCOR) 20 MG tablet Take 20 mg by mouth daily with breakfast.      . valsartan-hydrochlorothiazide (DIOVAN-HCT) 320-25 MG per tablet Take 1 tablet by mouth daily with breakfast.         Allergies  Allergen Reactions  . Codeine Rash    All over the body    BP 144/92  Pulse 100  Temp(Src) 97.4 F (36.3 C) (Temporal)  Resp 14  Ht 6' (1.829 m)  Wt 196  lb 12.8 oz (89.268 kg)  BMI 26.69 kg/m2  Ct Guided Abscess Drain  02/23/2012  *RADIOLOGY REPORT*  Clinical data:  Low anterior resection.  Follow-up CT demonstrates presacral gas and fluid collection near the anastomosis.  CT-GUIDED PELVIC ABSCESS DRAINAGE CATHETER PLACEMENT  Technique and findings: The procedure, risks (including but not limited to bleeding, infection, organ damage), benefits, and alternatives were explained to the patient.  Questions regarding the procedure were encouraged and answered.  The patient understands and consents to the procedure.The patient was placed prone on the CT gantry and limited axial scans through the lower pelvis were obtained.  The collection was localized and an appropriate skin entry  site was selected. Operator donned sterile gloves and mask.   Site was marked, prepped with Betadine, draped in usual sterile fashion, infiltrated locally with 1% lidocaine.  Intravenous Fentanyl and Versed were administered as conscious sedation during continuous cardiorespiratory monitoring by the radiology RN, with a total moderate sedation time of 10 minutes.  Under CT fluoroscopic guidance, and 18 gauge trocar needle was advanced into the collection.  An Amplatz guide wire advanced easily within the collection, its position confirmed on CT.  The tract was dilated to facilitate placement of a 12-French pigtail catheter, formed centrally within the collection.  Catheter secured externally with O-Prolene suture and Statlock, and placed to gravity bag.  A sample of   the aspirate was sent for Gram stain, culture and sensitivity. The patient tolerated the procedure well. No immediate complication.  IMPRESSION: 1.  Technically successful CT guided pelvic abscess drainage catheter placement.  Original Report Authenticated By: Osa Craver, M.D.   Ct Abdomen Pelvis W Contrast  02/22/2012  *RADIOLOGY REPORT*  Clinical Data: Abdominal pain.  History of surgery.  Evaluate for possible ulcer.  CT ABDOMEN AND PELVIS WITH CONTRAST  Technique:  Multidetector CT imaging of the abdomen and pelvis was performed following the standard protocol during bolus administration of intravenous contrast.  Contrast: OMNIPAQUE IOHEXOL 300 MG/ML  SOLN  Comparison: None  Findings: The lung bases are clear except for basilar scarring and minimal right middle lobe atelectasis.  No pulmonary nodules or pleural effusion.  The liver is unremarkable.  A small low attenuation lesion in the left hepatic lobe is an indeterminate finding and needs follow-up. No intrahepatic biliary dilatation.  The gallbladder is normal.  No common bile duct dilatation.  The pancreas is normal.  The spleen is normal in size.  No focal lesions.  The  adrenal glands are unremarkable.  There are multiple bilateral renal cyst.  No worrisome renal mass lesions or hydronephrosis.  The stomach, duodenum, small bowel and colon are unremarkable.  No inflammatory changes or mass lesions.  There are surgical changes at the rectosigmoid junction with anastomoses sutures.  There is an anastomotic leak with a small presacral space abscess measuring approximately 3 x 3.5 cm.  The leak appears to be from the right posterior aspect of the anastomoses at approximately the seven to eight o'clock position.  There is moderate wall thickening involving the bladder without discrete mass.  This could be due to chronic inflammation.  Small pelvic lymph nodes are noted but no adenopathy.  The prostate gland is enlarged.  There are postoperative changes from a prior colostomy.  The appendix is normal.  The bony structures are intact.  IMPRESSION:  1.  CT findings consistent with an anastomotic leak at the rectosigmoid junction with a small, 3.5 x 3.0 cm, presacral abscess. 2.  Small, low attenuation liver lesion is likely a benign finding but recommend attention on future scans.  Original Report Authenticated By: P. Loralie Champagne, M.D.   Dg Cystogram  02/11/2012  *RADIOLOGY REPORT*  Clinical Data: Status post bladder injury and repair.  CYSTOGRAM  Technique:  After catheterization of the urinary bladder following sterile technique the bladder was filled with 300 cc Cysto-Hypaque 30% by drip infusion.  Serial spot images were obtained during bladder filling and post draining.  Fluoroscopy Time: 1.18 minutes  Comparison:  None.  Findings: Filling of the bladder demonstrates a small caliber urinary bladder.  There is a mild smooth impression and indentation on the posterior superior bladder wall, possibly related postoperative changes.  I see no contrast extravasation to suggest leak.  IMPRESSION: No evidence of urinary leak from the bladder.  Original Report Authenticated By: Cyndie Chime, M.D.     Review of Systems  Constitutional: Negative for fever, chills and diaphoresis.  HENT: Negative for sore throat, trouble swallowing and neck pain.   Eyes: Negative for photophobia and visual disturbance.  Respiratory: Negative for choking and shortness of breath.   Cardiovascular: Negative for chest pain and palpitations.  Gastrointestinal: Negative for nausea, vomiting, abdominal pain, diarrhea, constipation, blood in stool, abdominal distention, anal bleeding and rectal pain.  Genitourinary: Negative for dysuria, urgency, frequency, difficulty urinating and testicular pain.  Musculoskeletal: Negative for myalgias, back pain, arthralgias and gait problem.  Skin: Negative for color change, pallor, rash and wound.  Neurological: Negative for dizziness, speech difficulty, weakness and numbness.  Hematological: Negative for adenopathy.  Psychiatric/Behavioral: Negative for hallucinations, confusion and agitation.       Objective:   Physical Exam  Constitutional: He is oriented to person, place, and time. He appears well-developed and well-nourished. No distress.  HENT:  Head: Normocephalic.  Mouth/Throat: Oropharynx is clear and moist. No oropharyngeal exudate.  Eyes: Conjunctivae and EOM are normal. Pupils are equal, round, and reactive to light. No scleral icterus.  Neck: Normal range of motion. No tracheal deviation present.  Cardiovascular: Normal rate, normal heart sounds and intact distal pulses.   Pulmonary/Chest: Effort normal. No respiratory distress.  Abdominal: Soft. He exhibits no distension. There is no tenderness. Hernia confirmed negative in the right inguinal area and confirmed negative in the left inguinal area.       Incisions clean with normal healing ridges.  No hernias  Genitourinary:       Transgluteal drain has dark green scant drainage.  Claims only 15 mL in the past 3 days. Not flushing it.  Musculoskeletal: Normal range of motion. He exhibits no  tenderness.  Neurological: He is alert and oriented to person, place, and time. No cranial nerve deficit. He exhibits normal muscle tone. Coordination normal.  Skin: Skin is warm and dry. No rash noted. He is not diaphoretic.  Psychiatric: He has a normal mood and affect. His behavior is normal.       Assessment:    Several weeks status post low anterior rectosigmoid resection with no evidence of residual disease/cancer.  Postop abscess/probable small leak. Controlled with oral antibiotics and drain.     Plan:     Increase activity as tolerated.  Do not push through pain.  Advanced on diet as tolerated. Bowel regimen to avoid problems.  Drain study next week to see if abscess cavity has closed down. Rule out a fistula. If there is persistence, continue antibiotics and drainage. If worse, may need a second drain or n.p.o./TMA or  diversion. Clinically he seems to be doing quite great except for this small area. Hopefully it'll resolve without any operations. He and his brother expressed understanding and appreciation  Return to clinic 2 weeks. The patient expressed understanding and appreciation

## 2012-03-02 NOTE — Patient Instructions (Signed)
Continue the antibiotics and drained until the abscess/fistula is closed. Anticipate 1-3 more weeks.  We will help you set up a study of the drain to see if the abscess/fistula has closed.  If you have fevers, nausea, worsening pain, worsening diarrhea, other concerns; please call us [phone]  DRAIN CARE:   You have a closed bulb drain to help you heal.  A bulb drain is a small, plastic reservoir which creates a gentle suction. It is used to remove excess fluid from a surgical wound. The color and amount of fluid will vary. Immediately after surgery, the fluid is bright red. It may gradually change to a yellow color. When the amount decreases to about 1 or 2 tablespoons (15 to 30 cc) per 24 hours, your caregiver will usually remove it.  DAILY CARE  Keep the bulb compressed at all times, except while emptying it. The compression creates suction.   Keep sites where the tubes enter the skin dry and covered with a light bandage (dressing).   Tape the tubes to your skin, 1 to 2 inches below the insertion sites, to keep from pulling on your stitches. Tubes are stitched in place and will not slip out.   Pin the bulb to your shirt (not to your pants) with a safety pin.   For the first few days after surgery, there usually is more fluid in the bulb. Empty the bulb whenever it becomes half full because the bulb does not create enough suction if it is too full. Include this amount in your 24 hour totals.   When the amount of drainage decreases, empty the bulb at the same time every day. Write down the amounts and the 24 hour totals. Your caregiver will want to know them. This helps your caregiver know when the tubes can be removed.   (We anticipate removing the drain in 1-3 weeks, depending on when the output is <57mL a day for 2+ days)  If there is drainage around the tube sites, change dressings and keep the area dry. If you see a clot in the tube, leave it alone. However, if the tube does not  appear to be draining, let your caregiver know.  TO EMPTY THE BULB  Open the stopper to release suction.   Holding the stopper out of the way, pour drainage into the measuring cup that was sent home with you.   Measure and write down the amount. If there are 2 bulbs, note the amount of drainage from bulb 1 or bulb 2 and keep the totals separate. Your caregiver will want to know which tube is draining more.   Compress the bulb by folding it in half.   Replace the stopper.   Check the tape that holds the tube to your skin, and pin the bulb to your shirt.  SEEK MEDICAL CARE IF:  The drainage develops a bad odor.   You have an oral temperature above 102 F (38.9 C).   The amount of drainage from your wound suddenly increases or decreases.   You accidentally pull out your drain.   You have any other questions or concerns.  MAKE SURE YOU:   Understand these instructions.   Will watch your condition.   Will get help right away if you are not doing well or get worse.     Call our office if you have any questions about your drain. [phone]  Managing Pain  Pain after surgery or related to activity is often due to strain/injury  to muscle, tendon, nerves and/or incisions.  This pain is usually short-term and will improve in a few months.   Many people find it helpful to do the following things TOGETHER to help speed the process of healing and to get back to regular activity more quickly:  1. Avoid heavy physical activity a.  no lifting greater than 20 pounds b. Do not "push through" the pain.  Listen to your body and avoid positions and maneuvers than reproduce the pain c. Walking is okay as tolerated, but go slowly and stop when getting sore.  d. Remember: If it hurts to do it, then don't do it! 2. Take Anti-inflammatory medication  a. Take with food/snack around the clock for 1-2 weeks i. This helps the muscle and nerve tissues become less irritable and calm down  faster b. Choose ONE of the following over-the-counter medications: i. Naproxen 220mg  tabs (ex. Aleve) 1-2 pills twice a day  ii. Ibuprofen 200mg  tabs (ex. Advil, Motrin) 3-4 pills with every meal and just before bedtime iii. Acetaminophen 500mg  tabs (Tylenol) 1-2 pills with every meal and just before bedtime 3. Use a Heating pad or Ice/Cold Pack a. 4-6 times a day b. May use warm bath/hottub  or showers 4. Try Gentle Massage and/or Stretching  a. at the area of pain many times a day b. stop if you feel pain - do not overdo it  Try these steps together to help you body heal faster and avoid making things get worse.  Doing just one of these things may not be enough.    If you are not getting better after two weeks or are noticing you are getting worse, contact our office for further advice; we may need to re-evaluate you & see what other things we can do to help.

## 2012-03-08 ENCOUNTER — Ambulatory Visit (HOSPITAL_COMMUNITY)
Admission: RE | Admit: 2012-03-08 | Discharge: 2012-03-08 | Disposition: A | Discharge status: Home / Self Care | Payer: Medicare Other | Source: Ambulatory Visit | Attending: Surgery | Admitting: Surgery

## 2012-03-08 DIAGNOSIS — R188 Other ascites: Secondary | ICD-10-CM

## 2012-03-08 DIAGNOSIS — K651 Peritoneal abscess: Secondary | ICD-10-CM | POA: Insufficient documentation

## 2012-03-08 MED ORDER — IOHEXOL 300 MG/ML  SOLN
50.0000 mL | Freq: Once | INTRAMUSCULAR | Status: AC | PRN
  Administered 2012-03-08: 20 mL via INTRAVENOUS

## 2012-03-08 NOTE — Progress Notes (Signed)
Patient ID: Charles Parker, male   DOB: June 28, 1946, 66 y.o.   MRN: [id number] Drainage catheter was injected under fluoroscopy.  No residual cavity and no fistula.  The drainage catheter was removed.

## 2012-03-16 ENCOUNTER — Ambulatory Visit (INDEPENDENT_AMBULATORY_CARE_PROVIDER_SITE_OTHER): Payer: Medicare Other | Admitting: Surgery

## 2012-03-16 ENCOUNTER — Encounter (INDEPENDENT_AMBULATORY_CARE_PROVIDER_SITE_OTHER): Payer: Self-pay | Admitting: Surgery

## 2012-03-16 VITALS — BP 138/82 | HR 62 | Temp 97.4°F | Resp 14 | Ht 72.0 in | Wt 191.8 lb

## 2012-03-16 DIAGNOSIS — R188 Other ascites: Secondary | ICD-10-CM

## 2012-03-16 DIAGNOSIS — D126 Benign neoplasm of colon, unspecified: Secondary | ICD-10-CM

## 2012-03-16 NOTE — Progress Notes (Signed)
Subjective:     Patient ID: Charles Parker, male   DOB: Mar 01, 1946, 66 y.o.   MRN: [id number]  HPI   Charles Parker  March 26, 1946 [id number]  Patient Care Team: Pola Corn, MD as PCP - General (Cardiology) Shirley Friar, MD as Consulting Physician (Gastroenterology)  This patient is a 66 y.o.male who presents today for surgical evaluation.   Procedure: Laparoscopic rectosigmoid low anterior resection with anastomosis. Bladder repair. 02/03/2012  Pathology: No evidence of adenomatous changes remaining in stump of polypectomy site.  The patient comes today with his brother. Again it takes some time to sort out what is going on with him.  He feels well. No nausea or vomiting. Energy good. Eating well. 5-6 loose bowel movements a day. Taking the oral antibiotics (Augmentin) - 2nd refill.  Drain study showed no leak/fistula/abscess, so it was removed.  Patient Active Problem List  Diagnosis  . 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  Marland Kitchen Hypertension  . Diabetes mellitus type II    Past Medical History  Diagnosis Date  . Hypertension   . Diabetes mellitus     type II  . Tubulovillous adenoma   . Hypercholesterolemia   . Cancer     Past Surgical History  Procedure Date  . Knee surgery 2004    left  . Colonoscopy   . Proctoscopy 02/03/2012    Procedure: PROCTOSCOPY;  Surgeon: Ardeth Sportsman, MD;  Location: [facility];  Service: General;;  . Bladder neck reconstruction 02/03/2012    Procedure: BLADDER NECK REPAIR;  Surgeon: Ardeth Sportsman, MD;  Location: [facility];  Service: General;;    History   Social History  . Marital Status: Widowed    Spouse Name: N/A    Number of Children: N/A  . Years of Education: N/A   Occupational History  . Not on file.   Social History Main Topics  . Smoking status: Never Smoker   . Smokeless tobacco: Never Used  . Alcohol Use: No  . Drug Use: No  . Sexually  Active: Not Currently   Other Topics Concern  . Not on file   Social History Narrative  . No narrative on file    History reviewed. No pertinent family history.  Current Outpatient Prescriptions  Medication Sig Dispense Refill  . acetaminophen (TYLENOL) 500 MG tablet Take 500 mg by mouth every 4 (four) hours as needed.      Marland Kitchen glimepiride (AMARYL) 2 MG tablet Take 2 mg by mouth daily before breakfast.      . metFORMIN (GLUCOPHAGE) 1000 MG tablet Take 1,000 mg by mouth daily with breakfast.      . oxyCODONE (OXY IR/ROXICODONE) 5 MG immediate release tablet Take 1-2 tablets (5-10 mg total) by mouth every 6 (six) hours as needed for pain.  40 tablet  0  . simvastatin (ZOCOR) 20 MG tablet Take 20 mg by mouth daily with breakfast.      . valsartan-hydrochlorothiazide (DIOVAN-HCT) 320-25 MG per tablet Take 1 tablet by mouth daily with breakfast.         Allergies  Allergen Reactions  . Codeine Rash    All over the body    BP 138/82  Pulse 62  Temp 97.4 F (36.3 C) (Temporal)  Resp 14  Ht 6' (1.829 m)  Wt 191 lb 12.8 oz (87 kg)  BMI 26.01 kg/m2  Ct Guided Abscess Drain  02/23/2012  *RADIOLOGY REPORT*  Clinical  data:  Low anterior resection.  Follow-up CT demonstrates presacral gas and fluid collection near the anastomosis.  CT-GUIDED PELVIC ABSCESS DRAINAGE CATHETER PLACEMENT  Technique and findings: The procedure, risks (including but not limited to bleeding, infection, organ damage), benefits, and alternatives were explained to the patient.  Questions regarding the procedure were encouraged and answered.  The patient understands and consents to the procedure.The patient was placed prone on the CT gantry and limited axial scans through the lower pelvis were obtained.  The collection was localized and an appropriate skin entry site was selected. Operator donned sterile gloves and mask.   Site was marked, prepped with Betadine, draped in usual sterile fashion, infiltrated locally with 1%  lidocaine.  Intravenous Fentanyl and Versed were administered as conscious sedation during continuous cardiorespiratory monitoring by the radiology RN, with a total moderate sedation time of 10 minutes.  Under CT fluoroscopic guidance, and 18 gauge trocar needle was advanced into the collection.  An Amplatz guide wire advanced easily within the collection, its position confirmed on CT.  The tract was dilated to facilitate placement of a 12-French pigtail catheter, formed centrally within the collection.  Catheter secured externally with O-Prolene suture and Statlock, and placed to gravity bag.  A sample of   the aspirate was sent for Gram stain, culture and sensitivity. The patient tolerated the procedure well. No immediate complication.  IMPRESSION: 1.  Technically successful CT guided pelvic abscess drainage catheter placement.  Original Report Authenticated By: Osa Craver, M.D.   Ct Abdomen Pelvis W Contrast  02/22/2012  *RADIOLOGY REPORT*  Clinical Data: Abdominal pain.  History of surgery.  Evaluate for possible ulcer.  CT ABDOMEN AND PELVIS WITH CONTRAST  Technique:  Multidetector CT imaging of the abdomen and pelvis was performed following the standard protocol during bolus administration of intravenous contrast.  Contrast: OMNIPAQUE IOHEXOL 300 MG/ML  SOLN  Comparison: None  Findings: The lung bases are clear except for basilar scarring and minimal right middle lobe atelectasis.  No pulmonary nodules or pleural effusion.  The liver is unremarkable.  A small low attenuation lesion in the left hepatic lobe is an indeterminate finding and needs follow-up. No intrahepatic biliary dilatation.  The gallbladder is normal.  No common bile duct dilatation.  The pancreas is normal.  The spleen is normal in size.  No focal lesions.  The adrenal glands are unremarkable.  There are multiple bilateral renal cyst.  No worrisome renal mass lesions or hydronephrosis.  The stomach, duodenum, small bowel  and colon are unremarkable.  No inflammatory changes or mass lesions.  There are surgical changes at the rectosigmoid junction with anastomoses sutures.  There is an anastomotic leak with a small presacral space abscess measuring approximately 3 x 3.5 cm.  The leak appears to be from the right posterior aspect of the anastomoses at approximately the seven to eight o'clock position.  There is moderate wall thickening involving the bladder without discrete mass.  This could be due to chronic inflammation.  Small pelvic lymph nodes are noted but no adenopathy.  The prostate gland is enlarged.  There are postoperative changes from a prior colostomy.  The appendix is normal.  The bony structures are intact.  IMPRESSION:  1.  CT findings consistent with an anastomotic leak at the rectosigmoid junction with a small, 3.5 x 3.0 cm, presacral abscess. 2.  Small, low attenuation liver lesion is likely a benign finding but recommend attention on future scans.  Original Report Authenticated By:  Cyndie Chime, M.D.   Dg Cystogram  02/11/2012  *RADIOLOGY REPORT*  Clinical Data: Status post bladder injury and repair.  CYSTOGRAM  Technique:  After catheterization of the urinary bladder following sterile technique the bladder was filled with 300 cc Cysto-Hypaque 30% by drip infusion.  Serial spot images were obtained during bladder filling and post draining.  Fluoroscopy Time: 1.18 minutes  Comparison:  None.  Findings: Filling of the bladder demonstrates a small caliber urinary bladder.  There is a mild smooth impression and indentation on the posterior superior bladder wall, possibly related postoperative changes.  I see no contrast extravasation to suggest leak.  IMPRESSION: No evidence of urinary leak from the bladder.  Original Report Authenticated By: Cyndie Chime, M.D.     Review of Systems  Constitutional: Negative for fever, chills and diaphoresis.  HENT: Negative for sore throat, trouble swallowing and neck  pain.   Eyes: Negative for photophobia and visual disturbance.  Respiratory: Negative for choking and shortness of breath.   Cardiovascular: Negative for chest pain and palpitations.  Gastrointestinal: Negative for nausea, vomiting, abdominal pain, diarrhea, constipation, blood in stool, abdominal distention, anal bleeding and rectal pain.  Genitourinary: Negative for dysuria, urgency, frequency, difficulty urinating and testicular pain.  Musculoskeletal: Negative for myalgias, back pain, arthralgias and gait problem.  Skin: Negative for color change, pallor, rash and wound.  Neurological: Negative for dizziness, speech difficulty, weakness and numbness.  Hematological: Negative for adenopathy.  Psychiatric/Behavioral: Negative for hallucinations, confusion and agitation.       Objective:   Physical Exam  Constitutional: He is oriented to person, place, and time. He appears well-developed and well-nourished. No distress.  HENT:  Head: Normocephalic.  Mouth/Throat: Oropharynx is clear and moist. No oropharyngeal exudate.  Eyes: Conjunctivae and EOM are normal. Pupils are equal, round, and reactive to light. No scleral icterus.  Neck: Normal range of motion. No tracheal deviation present.  Cardiovascular: Normal rate, normal heart sounds and intact distal pulses.   Pulmonary/Chest: Effort normal. No respiratory distress.  Abdominal: Soft. He exhibits no distension. There is no tenderness. Hernia confirmed negative in the right inguinal area and confirmed negative in the left inguinal area.       Incisions clean with normal healing ridges.  No hernias  Musculoskeletal: Normal range of motion. He exhibits no tenderness.  Neurological: He is alert and oriented to person, place, and time. No cranial nerve deficit. He exhibits normal muscle tone. Coordination normal.  Skin: Skin is warm and dry. No rash noted. He is not diaphoretic.  Psychiatric: He has a normal mood and affect. His behavior is  normal.       Assessment:    5 weeks status post low anterior rectosigmoid resection for removal of remaining tissue s/p polypectomy of large RECTAL polyp with high-grade dysplasia.  Postop abscess resolved.     Plan:     Increase activity as tolerated.  Do not push through pain.  Complete oral antibiotics.  Call if worse or fails to improve.  Advanced on diet as tolerated. Bowel regimen to avoid problems.  I recommend he consider Kaopectate to help thicken and slow down his stools. He get on a bowel regimen as well.  Clinically he seems to be doing much better.  He and his brother expressed understanding and appreciation  Consider followup endoscopy 12 months after surgery to rule out new polyps = [id number].   I will defer to Dr. Bosie Clos, his gastroenterologist.

## 2012-03-16 NOTE — Patient Instructions (Addendum)

## 2012-03-29 ENCOUNTER — Encounter (INDEPENDENT_AMBULATORY_CARE_PROVIDER_SITE_OTHER): Payer: Self-pay

## 2012-06-09 ENCOUNTER — Other Ambulatory Visit (INDEPENDENT_AMBULATORY_CARE_PROVIDER_SITE_OTHER): Payer: Self-pay | Admitting: Surgery

## 2013-02-01 HISTORY — PX: COLONOSCOPY: SHX174

## 2013-04-25 ENCOUNTER — Other Ambulatory Visit: Payer: Self-pay | Admitting: Cardiology

## 2013-04-25 ENCOUNTER — Ambulatory Visit
Admission: RE | Admit: 2013-04-25 | Discharge: 2013-04-25 | Disposition: A | Discharge status: Home / Self Care | Payer: Medicare Other | Source: Ambulatory Visit | Attending: Cardiology | Admitting: Cardiology

## 2013-04-25 DIAGNOSIS — E119 Type 2 diabetes mellitus without complications: Secondary | ICD-10-CM

## 2013-04-25 DIAGNOSIS — R252 Cramp and spasm: Secondary | ICD-10-CM

## 2013-05-31 ENCOUNTER — Other Ambulatory Visit: Payer: Self-pay | Admitting: Gastroenterology

## 2013-06-13 LAB — HM DIABETES EYE EXAM

## 2013-06-20 ENCOUNTER — Other Ambulatory Visit (INDEPENDENT_AMBULATORY_CARE_PROVIDER_SITE_OTHER): Payer: Medicare Other

## 2013-06-20 ENCOUNTER — Ambulatory Visit (INDEPENDENT_AMBULATORY_CARE_PROVIDER_SITE_OTHER): Payer: Medicare Other | Admitting: Internal Medicine

## 2013-06-20 ENCOUNTER — Encounter: Payer: Self-pay | Admitting: Internal Medicine

## 2013-06-20 VITALS — BP 118/74 | HR 84 | Temp 97.8°F | Resp 16 | Ht 72.0 in | Wt 194.0 lb

## 2013-06-20 DIAGNOSIS — E785 Hyperlipidemia, unspecified: Secondary | ICD-10-CM

## 2013-06-20 DIAGNOSIS — F341 Dysthymic disorder: Secondary | ICD-10-CM

## 2013-06-20 DIAGNOSIS — E1139 Type 2 diabetes mellitus with other diabetic ophthalmic complication: Secondary | ICD-10-CM

## 2013-06-20 DIAGNOSIS — Z0000 Encounter for general adult medical examination without abnormal findings: Secondary | ICD-10-CM

## 2013-06-20 DIAGNOSIS — R7401 Elevation of levels of liver transaminase levels: Secondary | ICD-10-CM

## 2013-06-20 DIAGNOSIS — D638 Anemia in other chronic diseases classified elsewhere: Secondary | ICD-10-CM | POA: Insufficient documentation

## 2013-06-20 DIAGNOSIS — R251 Tremor, unspecified: Secondary | ICD-10-CM

## 2013-06-20 DIAGNOSIS — F418 Other specified anxiety disorders: Secondary | ICD-10-CM

## 2013-06-20 DIAGNOSIS — G4733 Obstructive sleep apnea (adult) (pediatric): Secondary | ICD-10-CM

## 2013-06-20 DIAGNOSIS — R259 Unspecified abnormal involuntary movements: Secondary | ICD-10-CM

## 2013-06-20 DIAGNOSIS — R7402 Elevation of levels of lactic acid dehydrogenase (LDH): Secondary | ICD-10-CM

## 2013-06-20 DIAGNOSIS — N401 Enlarged prostate with lower urinary tract symptoms: Secondary | ICD-10-CM | POA: Insufficient documentation

## 2013-06-20 DIAGNOSIS — I10 Essential (primary) hypertension: Secondary | ICD-10-CM

## 2013-06-20 DIAGNOSIS — N400 Enlarged prostate without lower urinary tract symptoms: Secondary | ICD-10-CM

## 2013-06-20 DIAGNOSIS — N410 Acute prostatitis: Secondary | ICD-10-CM

## 2013-06-20 DIAGNOSIS — D649 Anemia, unspecified: Secondary | ICD-10-CM

## 2013-06-20 DIAGNOSIS — Z23 Encounter for immunization: Secondary | ICD-10-CM

## 2013-06-20 LAB — LIPID PANEL
Cholesterol: 186 mg/dL (ref 0–200)
LDL Cholesterol: 113 mg/dL — ABNORMAL HIGH (ref 0–99)
Triglycerides: 144 mg/dL (ref 0.0–149.0)

## 2013-06-20 LAB — URINALYSIS, ROUTINE W REFLEX MICROSCOPIC
Hgb urine dipstick: NEGATIVE
Nitrite: POSITIVE
RBC / HPF: NONE SEEN (ref 0–?)
Total Protein, Urine: NEGATIVE
Urine Glucose: NEGATIVE
pH: 6 (ref 5.0–8.0)

## 2013-06-20 LAB — PSA: PSA: 2.4 ng/mL (ref 0.10–4.00)

## 2013-06-20 LAB — HM DIABETES FOOT EXAM

## 2013-06-20 LAB — COMPREHENSIVE METABOLIC PANEL
BUN: 12 mg/dL (ref 6–23)
CO2: 26 mEq/L (ref 19–32)
Calcium: 9.1 mg/dL (ref 8.4–10.5)
Creatinine, Ser: 0.9 mg/dL (ref 0.4–1.5)
GFR: 105.44 mL/min (ref >60.00)
Glucose, Bld: 86 mg/dL (ref 70–99)
Total Bilirubin: 0.9 mg/dL (ref 0.3–1.2)

## 2013-06-20 LAB — CBC WITH DIFFERENTIAL/PLATELET
Lymphocytes Relative: 30.7 % (ref 12.0–46.0)
MCHC: 34.4 g/dL (ref 30.0–36.0)
MCV: 92.8 fl (ref 78.0–100.0)
Monocytes Absolute: 0.3 10*3/uL (ref 0.1–1.0)
Neutrophils Relative %: 58 % (ref 43.0–77.0)
Platelets: 157 10*3/uL (ref 150.0–400.0)
WBC: 3.1 10*3/uL — ABNORMAL LOW (ref 4.5–10.5)

## 2013-06-20 LAB — IBC PANEL
Iron: 117 ug/dL (ref 42–165)
Saturation Ratios: 34.6 % (ref 20.0–50.0)

## 2013-06-20 LAB — MICROALBUMIN / CREATININE URINE RATIO: Microalb, Ur: 2.5 mg/dL — ABNORMAL HIGH (ref 0.0–1.9)

## 2013-06-20 MED ORDER — SERTRALINE HCL 50 MG PO TABS: 50.0000 mg | ORAL_TABLET | Freq: Every day | ORAL | Status: DC

## 2013-06-20 NOTE — Patient Instructions (Signed)
Type 2 Diabetes Mellitus, Adult Type 2 diabetes mellitus, often simply referred to as type 2 diabetes, is a long-lasting (chronic) disease. In type 2 diabetes, the pancreas does not make enough insulin (a hormone), the cells are less responsive to the insulin that is made (insulin resistance), or both. Normally, insulin moves sugars from food into the tissue cells. The tissue cells use the sugars for energy. The lack of insulin or the lack of normal response to insulin causes excess sugars to build up in the blood instead of going into the tissue cells. As a result, high blood sugar (hyperglycemia) develops. The effect of high sugar (glucose) levels can cause many complications. Type 2 diabetes was also previously called adult-onset diabetes but it can occur at any age.  RISK FACTORS  A person is predisposed to developing type 2 diabetes if someone in the family has the disease and also has one or more of the following primary risk factors:  Overweight.  An inactive lifestyle.  A history of consistently eating high-calorie foods. Maintaining a normal weight and regular physical activity can reduce the chance of developing type 2 diabetes. SYMPTOMS  A person with type 2 diabetes may not show symptoms initially. The symptoms of type 2 diabetes appear slowly. The symptoms include:  Increased thirst (polydipsia).  Increased urination (polyuria).  Increased urination during the night (nocturia).  Weight loss. This weight loss may be rapid.  Frequent, recurring infections.  Tiredness (fatigue).  Weakness.  Vision changes, such as blurred vision.  Fruity smell to your breath.  Abdominal pain.  Nausea or vomiting.  Cuts or bruises which are slow to heal.  Tingling or numbness in the hands or feet. DIAGNOSIS Type 2 diabetes is frequently not diagnosed until complications of diabetes are present. Type 2 diabetes is diagnosed when symptoms or complications are present and when blood  glucose levels are increased. Your blood glucose level may be checked by one or more of the following blood tests:  A fasting blood glucose test. You will not be allowed to eat for at least 8 hours before a blood sample is taken.  A random blood glucose test. Your blood glucose is checked at any time of the day regardless of when you ate.  A hemoglobin A1c blood glucose test. A hemoglobin A1c test provides information about blood glucose control over the previous 3 months.  An oral glucose tolerance test (OGTT). Your blood glucose is measured after you have not eaten (fasted) for 2 hours and then after you drink a glucose-containing beverage. TREATMENT   You may need to take insulin or diabetes medicine daily to keep blood glucose levels in the desired range.  You will need to match insulin dosing with exercise and healthy food choices. The treatment goal is to maintain the before meal blood sugar (preprandial glucose) level at 70 130 mg/dL. HOME CARE INSTRUCTIONS   Have your hemoglobin A1c level checked twice a year.  Perform daily blood glucose monitoring as directed by your caregiver.  Monitor urine ketones when you are ill and as directed by your caregiver.  Take your diabetes medicine or insulin as directed by your caregiver to maintain your blood glucose levels in the desired range.  Never run out of diabetes medicine or insulin. It is needed every day.  Adjust insulin based on your intake of carbohydrates. Carbohydrates can raise blood glucose levels but need to be included in your diet. Carbohydrates provide vitamins, minerals, and fiber which are an essential part of   a healthy diet. Carbohydrates are found in fruits, vegetables, whole grains, dairy products, legumes, and foods containing added sugars.    Eat healthy foods. Alternate 3 meals with 3 snacks.  Lose weight if overweight.  Carry a medical alert card or wear your medical alert jewelry.  Carry a 15 gram  carbohydrate snack with you at all times to treat low blood glucose (hypoglycemia). Some examples of 15 gram carbohydrate snacks include:  Glucose tablets, 3 or 4   Glucose gel, 15 gram tube  Raisins, 2 tablespoons (24 grams)  Jelly beans, 6  Animal crackers, 8  Regular pop, 4 ounces (120 mL)  Gummy treats, 9  Recognize hypoglycemia. Hypoglycemia occurs with blood glucose levels of 70 mg/dL and below. The risk for hypoglycemia increases when fasting or skipping meals, during or after intense exercise, and during sleep. Hypoglycemia symptoms can include:  Tremors or shakes.  Decreased ability to concentrate.  Sweating.  Increased heart rate.  Headache.  Dry mouth.  Hunger.  Irritability.  Anxiety.  Restless sleep.  Altered speech or coordination.  Confusion.  Treat hypoglycemia promptly. If you are alert and able to safely swallow, follow the 15:15 rule:  Take 15 20 grams of rapid-acting glucose or carbohydrate. Rapid-acting options include glucose gel, glucose tablets, or 4 ounces (120 mL) of fruit juice, regular soda, or low fat milk.  Check your blood glucose level 15 minutes after taking the glucose.  Take 15 20 grams more of glucose if the repeat blood glucose level is still 70 mg/dL or below.  Eat a meal or snack within 1 hour once blood glucose levels return to normal.    Be alert to polyuria and polydipsia which are early signs of hyperglycemia. An early awareness of hyperglycemia allows for prompt treatment. Treat hyperglycemia as directed by your caregiver.  Engage in at least 150 minutes of moderate-intensity physical activity a week, spread over at least 3 days of the week or as directed by your caregiver. In addition, you should engage in resistance exercise at least 2 times a week or as directed by your caregiver.  Adjust your medicine and food intake as needed if you start a new exercise or sport.  Follow your sick day plan at any time you  are unable to eat or drink as usual.  Avoid tobacco use.  Limit alcohol intake to no more than 1 drink per day for nonpregnant women and 2 drinks per day for men. You should drink alcohol only when you are also eating food. Talk with your caregiver whether alcohol is safe for you. Tell your caregiver if you drink alcohol several times a week.  Follow up with your caregiver regularly.  Schedule an eye exam soon after the diagnosis of type 2 diabetes and then annually.  Perform daily skin and foot care. Examine your skin and feet daily for cuts, bruises, redness, nail problems, bleeding, blisters, or sores. A foot exam by a caregiver should be done annually.  Brush your teeth and gums at least twice a day and floss at least once a day. Follow up with your dentist regularly.  Share your diabetes management plan with your workplace or school.  Stay up-to-date with immunizations.  Learn to manage stress.  Obtain ongoing diabetes education and support as needed.  Participate in, or seek rehabilitation as needed to maintain or improve independence and quality of life. Request a physical or occupational therapy referral if you are having foot or hand numbness or difficulties with grooming,   dressing, eating, or physical activity. SEEK MEDICAL CARE IF:   You are unable to eat food or drink fluids for more than 6 hours.  You have nausea and vomiting for more than 6 hours.  Your blood glucose level is over 240 mg/dL.  There is a change in mental status.  You develop an additional serious illness.  You have diarrhea for more than 6 hours.  You have been sick or have had a fever for a couple of days and are not getting better.  You have pain during any physical activity.  SEEK IMMEDIATE MEDICAL CARE IF:  You have difficulty breathing.  You have moderate to large ketone levels. MAKE SURE YOU:  Understand these instructions.  Will watch your condition.  Will get help right away if  you are not doing well or get worse. Document Released: 09/20/2005 Document Revised: 06/14/2012 Document Reviewed: 04/18/2012 [employer] Patient Information 2014 [employer]. Health Maintenance, Males A healthy lifestyle and preventative care can promote health and wellness.  Maintain regular health, dental, and eye exams.  Eat a healthy diet. Foods like vegetables, fruits, whole grains, low-fat dairy products, and lean protein foods contain the nutrients you need without too many calories. Decrease your intake of foods high in solid fats, added sugars, and salt. Get information about a proper diet from your caregiver, if necessary.  Regular physical exercise is one of the most important things you can do for your health. Most adults should get at least 150 minutes of moderate-intensity exercise (any activity that increases your heart rate and causes you to sweat) each week. In addition, most adults need muscle-strengthening exercises on 2 or more days a week.   Maintain a healthy weight. The body mass index (BMI) is a screening tool to identify possible weight problems. It provides an estimate of body fat based on height and weight. Your caregiver can help determine your BMI, and can help you achieve or maintain a healthy weight. For adults 20 years and older:  A BMI below 18.5 is considered underweight.  A BMI of 18.5 to 24.9 is normal.  A BMI of 25 to 29.9 is considered overweight.  A BMI of 30 and above is considered obese.  Maintain normal blood lipids and cholesterol by exercising and minimizing your intake of saturated fat. Eat a balanced diet with plenty of fruits and vegetables. Blood tests for lipids and cholesterol should begin at age 20 and be repeated every 5 years. If your lipid or cholesterol levels are high, you are over 50, or you are a high risk for heart disease, you may need your cholesterol levels checked more frequently.Ongoing high lipid and cholesterol levels should  be treated with medicines, if diet and exercise are not effective.  If you smoke, find out from your caregiver how to quit. If you do not use tobacco, do not start.  If you choose to drink alcohol, do not exceed 2 drinks per day. One drink is considered to be 12 ounces (355 mL) of beer, 5 ounces (148 mL) of wine, or 1.5 ounces (44 mL) of liquor.  Avoid use of street drugs. Do not share needles with anyone. Ask for help if you need support or instructions about stopping the use of drugs.  High blood pressure causes heart disease and increases the risk of stroke. Blood pressure should be checked at least every 1 to 2 years. Ongoing high blood pressure should be treated with medicines if weight loss and exercise are not effective.    If you are 45 to 67 years old, ask your caregiver if you should take aspirin to prevent heart disease.  Diabetes screening involves taking a blood sample to check your fasting blood sugar level. This should be done once every 3 years, after age 45, if you are within normal weight and without risk factors for diabetes. Testing should be considered at a younger age or be carried out more frequently if you are overweight and have at least 1 risk factor for diabetes.  Colorectal cancer can be detected and often prevented. Most routine colorectal cancer screening begins at the age of 50 and continues through age 75. However, your caregiver may recommend screening at an earlier age if you have risk factors for colon cancer. On a yearly basis, your caregiver may provide home test kits to check for hidden blood in the stool. Use of a small camera at the end of a tube, to directly examine the colon (sigmoidoscopy or colonoscopy), can detect the earliest forms of colorectal cancer. Talk to your caregiver about this at age 50, when routine screening begins. Direct examination of the colon should be repeated every 5 to 10 years through age 75, unless early forms of pre-cancerous polyps or  small growths are found.  Hepatitis C blood testing is recommended for all people born from 1945 through 1965 and any individual with known risks for hepatitis C.  Healthy men should no longer receive prostate-specific antigen (PSA) blood tests as part of routine cancer screening. Consult with your caregiver about prostate cancer screening.  Testicular cancer screening is not recommended for adolescents or adult males who have no symptoms. Screening includes self-exam, caregiver exam, and other screening tests. Consult with your caregiver about any symptoms you have or any concerns you have about testicular cancer.  Practice safe sex. Use condoms and avoid high-risk sexual practices to reduce the spread of sexually transmitted infections (STIs).  Use sunscreen with a sun protection factor (SPF) of 30 or greater. Apply sunscreen liberally and repeatedly throughout the day. You should seek shade when your shadow is shorter than you. Protect yourself by wearing long sleeves, pants, a wide-brimmed hat, and sunglasses year round, whenever you are outdoors.  Notify your caregiver of new moles or changes in moles, especially if there is a change in shape or color. Also notify your caregiver if a mole is larger than the size of a pencil eraser.  A one-time screening for abdominal aortic aneurysm (AAA) and surgical repair of large AAAs by sound wave imaging (ultrasonography) is recommended for ages 65 to 75 years who are current or former smokers.  Stay current with your immunizations. Document Released: 03/18/2008 Document Revised: 12/13/2011 Document Reviewed: 02/15/2011 [employer] Patient Information 2014 [employer].  

## 2013-06-20 NOTE — Progress Notes (Signed)
Subjective:    Patient ID: Charles Parker, male    DOB: 09-21-1946, 67 y.o.   MRN: [id number]  HPI Comments: New to me, transfer from Dr. Shana Chute . No prior records are available.  Diabetes He presents for his follow-up diabetic visit. He has type 2 diabetes mellitus. The initial diagnosis of diabetes was made 5 years ago. His disease course has been fluctuating. Hypoglycemia symptoms include nervousness/anxiousness and tremors (right hand, for one year). Pertinent negatives for hypoglycemia include no confusion, dizziness, headaches, pallor, seizures or speech difficulty. Associated symptoms include visual change. Pertinent negatives for diabetes include no blurred vision, no chest pain, no fatigue, no foot paresthesias, no foot ulcerations, no polydipsia, no polyphagia, no polyuria, no weakness and no weight loss. There are no hypoglycemic complications. Symptoms are stable. Diabetic complications include retinopathy. Current diabetic treatment includes oral agent (monotherapy). He is compliant with treatment most of the time. His weight is decreasing steadily. He is following a generally healthy diet. Meal planning includes avoidance of concentrated sweets. He has not had a previous visit with a dietician. He participates in exercise intermittently. There is no change in his home blood glucose trend. An ACE inhibitor/angiotensin II receptor blocker is not being taken. He sees a podiatrist.Eye exam is current.      Review of Systems  Constitutional: Negative.  Negative for fever, chills, weight loss, diaphoresis, activity change, appetite change, fatigue and unexpected weight change.  HENT: Positive for hearing loss (over the last year, has an appt with audiology soon).   Eyes: Negative.  Negative for blurred vision.  Respiratory: Positive for apnea. Negative for cough, choking, chest tightness, shortness of breath, wheezing and stridor.   Cardiovascular: Negative for chest pain, palpitations  and leg swelling.  Gastrointestinal: Positive for diarrhea (evaluated by Dr. Bosie Clos). Negative for nausea, vomiting, abdominal pain, constipation, blood in stool, abdominal distention, anal bleeding and rectal pain.  Endocrine: Negative.  Negative for polydipsia, polyphagia and polyuria.  Genitourinary: Positive for dysuria. Negative for urgency, frequency, hematuria, flank pain, decreased urine volume, discharge, penile swelling, scrotal swelling, enuresis, difficulty urinating, genital sores, penile pain and testicular pain.  Musculoskeletal: Positive for arthralgias (left knee). Negative for myalgias, back pain, joint swelling and gait problem.  Skin: Negative.  Negative for color change, pallor, rash and wound.  Allergic/Immunologic: Negative.   Neurological: Positive for tremors (right hand, for one year). Negative for dizziness, seizures, syncope, facial asymmetry, speech difficulty, weakness, light-headedness, numbness and headaches.  Hematological: Negative.  Negative for adenopathy. Does not bruise/bleed easily.  Psychiatric/Behavioral: Positive for dysphoric mood. Negative for suicidal ideas, hallucinations, behavioral problems, confusion, sleep disturbance, self-injury, decreased concentration and agitation. The patient is nervous/anxious. The patient is not hyperactive.        Objective:   Physical Exam  Vitals reviewed. Constitutional: He is oriented to person, place, and time. He appears well-developed and well-nourished. No distress.  HENT:  Head: Normocephalic and atraumatic.  Mouth/Throat: Oropharynx is clear and moist. No oropharyngeal exudate.  Eyes: Conjunctivae are normal. Right eye exhibits no discharge. Left eye exhibits no discharge. No scleral icterus.  Neck: Normal range of motion. Neck supple. No JVD present. No tracheal deviation present. No thyromegaly present.  Cardiovascular: Normal rate, regular rhythm, normal heart sounds and intact distal pulses.  Exam  reveals no gallop and no friction rub.   No murmur heard. Pulmonary/Chest: Effort normal and breath sounds normal. No stridor. No respiratory distress. He has no wheezes. He has no rales. He exhibits no tenderness.  Abdominal: Soft. Bowel sounds are normal. He exhibits no distension and no mass. There is no tenderness. There is no rebound and no guarding. Hernia confirmed negative in the right inguinal area and confirmed negative in the left inguinal area.  Genitourinary: Rectum normal, testes normal and penis normal. Rectal exam shows no external hemorrhoid, no internal hemorrhoid, no fissure, no mass, no tenderness and anal tone normal. Guaiac negative stool. Prostate is enlarged (1+ BPH with right lobe boggy and larger than left lobe) and tender. Right testis shows no mass, no swelling and no tenderness. Right testis is descended. Left testis shows no mass, no swelling and no tenderness. Left testis is descended. Circumcised. No penile erythema or penile tenderness. No discharge found.  Musculoskeletal: Normal range of motion. He exhibits no edema and no tenderness.  Lymphadenopathy:    He has no cervical adenopathy.       Right: No inguinal adenopathy present.       Left: No inguinal adenopathy present.  Neurological: He is alert and oriented to person, place, and time. He displays normal reflexes. No cranial nerve deficit. He exhibits normal muscle tone. Coordination normal.  Skin: Skin is warm and dry. No rash noted. He is not diaphoretic. No erythema. No pallor.  Psychiatric: He has a normal mood and affect. His behavior is normal. Judgment and thought content normal.     Lab Results  Component Value Date   WBC 12.5* 02/21/2012   HGB 12.2* 02/21/2012   HCT 35.9* 02/21/2012   PLT 302 02/21/2012   GLUCOSE 148* 02/04/2012   CHOL  Value: 272        ATP III CLASSIFICATION:  <200     mg/dL   Desirable  956-213  mg/dL   Borderline High  >=086    mg/dL   High       * 5/78/4696   TRIG 147 04/18/2010    HDL 41 04/18/2010   LDLCALC  Value: 202        Total Cholesterol/HDL:CHD Risk Coronary Heart Disease Risk Table                     Men   Women  1/2 Average Risk   3.4   3.3  Average Risk       5.0   4.4  2 X Average Risk   9.6   7.1  3 X Average Risk  23.4   11.0        Use the calculated Patient Ratio above and the CHD Risk Table to determine the patient's CHD Risk.        ATP III CLASSIFICATION (LDL):  <100     mg/dL   Optimal  295-284  mg/dL   Near or Above                    Optimal  130-159  mg/dL   Borderline  132-440  mg/dL   High  >102     mg/dL   Very High* 04/28/3663   ALT 59* 04/17/2010   AST 45* 04/17/2010   NA 136 02/04/2012   K 3.9 02/04/2012   CL 100 02/04/2012   CREATININE 1.09 02/04/2012   BUN 10 02/04/2012   CO2 27 02/04/2012   PSA 2.78 Test Methodology: [employer] PSA 05/21/2009   INR 2.1* 05/26/2009   HGBA1C 9.2* 02/04/2012       Assessment & Plan:

## 2013-06-21 ENCOUNTER — Encounter: Payer: Self-pay | Admitting: Internal Medicine

## 2013-06-21 DIAGNOSIS — N410 Acute prostatitis: Secondary | ICD-10-CM | POA: Insufficient documentation

## 2013-06-21 LAB — HEPATITIS B CORE ANTIBODY, TOTAL: Hep B Core Total Ab: NEGATIVE

## 2013-06-21 MED ORDER — SIMVASTATIN 40 MG PO TABS: 40.0000 mg | ORAL_TABLET | Freq: Every evening | ORAL | Status: DC

## 2013-06-21 MED ORDER — CIPROFLOXACIN HCL 500 MG PO TABS: 500.0000 mg | ORAL_TABLET | Freq: Two times a day (BID) | ORAL | Status: DC

## 2013-06-21 NOTE — Assessment & Plan Note (Signed)
He needs to have a sleep evaluation performed

## 2013-06-21 NOTE — Assessment & Plan Note (Signed)
I will recheck his LFT's and will check for viral hepatitis He will d/w his podiatrist the lamisil therapy

## 2013-06-21 NOTE — Assessment & Plan Note (Signed)
Start zoloft

## 2013-06-21 NOTE — Assessment & Plan Note (Signed)
His BP is well controlled 

## 2013-06-21 NOTE — Assessment & Plan Note (Signed)
I see no signs of blood loss Will recheck his CBC and will look at his vitamin levels as well

## 2013-06-21 NOTE — Assessment & Plan Note (Signed)
He is doing well on simvastatin but the LDL is not at his goal so I will increase the dose

## 2013-06-21 NOTE — Assessment & Plan Note (Signed)

## 2013-06-21 NOTE — Assessment & Plan Note (Signed)
His A1C shows good control

## 2013-06-21 NOTE — Assessment & Plan Note (Signed)
PSA is normal.

## 2013-06-21 NOTE — Assessment & Plan Note (Signed)
Neurology referral 

## 2013-06-22 ENCOUNTER — Emergency Department (HOSPITAL_COMMUNITY)
Admission: EM | Admit: 2013-06-22 | Discharge: 2013-06-22 | Disposition: A | Discharge status: Home / Self Care | Payer: Medicare Other | Attending: Emergency Medicine | Admitting: Emergency Medicine

## 2013-06-22 ENCOUNTER — Encounter (HOSPITAL_COMMUNITY): Payer: Self-pay

## 2013-06-22 ENCOUNTER — Emergency Department (HOSPITAL_COMMUNITY): Payer: Medicare Other

## 2013-06-22 DIAGNOSIS — Z859 Personal history of malignant neoplasm, unspecified: Secondary | ICD-10-CM | POA: Insufficient documentation

## 2013-06-22 DIAGNOSIS — S0990XA Unspecified injury of head, initial encounter: Secondary | ICD-10-CM | POA: Insufficient documentation

## 2013-06-22 DIAGNOSIS — E7800 Pure hypercholesterolemia, unspecified: Secondary | ICD-10-CM | POA: Insufficient documentation

## 2013-06-22 DIAGNOSIS — I10 Essential (primary) hypertension: Secondary | ICD-10-CM | POA: Insufficient documentation

## 2013-06-22 DIAGNOSIS — E119 Type 2 diabetes mellitus without complications: Secondary | ICD-10-CM | POA: Insufficient documentation

## 2013-06-22 DIAGNOSIS — S0993XA Unspecified injury of face, initial encounter: Secondary | ICD-10-CM | POA: Insufficient documentation

## 2013-06-22 DIAGNOSIS — Y9389 Activity, other specified: Secondary | ICD-10-CM | POA: Insufficient documentation

## 2013-06-22 DIAGNOSIS — M542 Cervicalgia: Secondary | ICD-10-CM

## 2013-06-22 DIAGNOSIS — M5030 Other cervical disc degeneration, unspecified cervical region: Secondary | ICD-10-CM

## 2013-06-22 DIAGNOSIS — Y92410 Unspecified street and highway as the place of occurrence of the external cause: Secondary | ICD-10-CM | POA: Insufficient documentation

## 2013-06-22 DIAGNOSIS — Z792 Long term (current) use of antibiotics: Secondary | ICD-10-CM | POA: Insufficient documentation

## 2013-06-22 DIAGNOSIS — Z79899 Other long term (current) drug therapy: Secondary | ICD-10-CM | POA: Insufficient documentation

## 2013-06-22 MED ORDER — IBUPROFEN 400 MG PO TABS: 400.0000 mg | ORAL_TABLET | Freq: Four times a day (QID) | ORAL | Status: DC | PRN

## 2013-06-22 MED ORDER — CYCLOBENZAPRINE HCL 10 MG PO TABS: 10.0000 mg | ORAL_TABLET | Freq: Two times a day (BID) | ORAL | Status: DC | PRN
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
## 2013-06-27 ENCOUNTER — Encounter: Payer: Self-pay | Admitting: Internal Medicine

## 2013-06-27 ENCOUNTER — Ambulatory Visit (INDEPENDENT_AMBULATORY_CARE_PROVIDER_SITE_OTHER)
Admission: RE | Admit: 2013-06-27 | Discharge: 2013-06-27 | Disposition: A | Discharge status: Home / Self Care | Payer: Medicare Other | Source: Ambulatory Visit | Attending: Internal Medicine | Admitting: Internal Medicine

## 2013-06-27 ENCOUNTER — Ambulatory Visit (INDEPENDENT_AMBULATORY_CARE_PROVIDER_SITE_OTHER): Payer: Medicare Other | Admitting: Internal Medicine

## 2013-06-27 VITALS — BP 110/64 | HR 86 | Temp 97.6°F | Resp 16 | Wt 189.4 lb

## 2013-06-27 DIAGNOSIS — S8990XA Unspecified injury of unspecified lower leg, initial encounter: Secondary | ICD-10-CM

## 2013-06-27 DIAGNOSIS — S161XXA Strain of muscle, fascia and tendon at neck level, initial encounter: Secondary | ICD-10-CM | POA: Insufficient documentation

## 2013-06-27 DIAGNOSIS — S139XXA Sprain of joints and ligaments of unspecified parts of neck, initial encounter: Secondary | ICD-10-CM

## 2013-06-27 DIAGNOSIS — S99919A Unspecified injury of unspecified ankle, initial encounter: Secondary | ICD-10-CM

## 2013-06-27 DIAGNOSIS — S8992XA Unspecified injury of left lower leg, initial encounter: Secondary | ICD-10-CM

## 2013-06-27 MED ORDER — TRAMADOL HCL 50 MG PO TABS: 50.0000 mg | ORAL_TABLET | Freq: Three times a day (TID) | ORAL | Status: DC | PRN

## 2013-06-27 NOTE — Assessment & Plan Note (Signed)
He was given ibuprofen in the ER but he is already on celebrex so ibuprofen will be discontinued He wants something else for pain so I wrote for tramadol The xray is abnormal with swelling and an effusion (+DJD), he may need an MRI or arthroscopy so I have referred him to ortho
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
## 2013-06-27 NOTE — Patient Instructions (Signed)
Knee Pain  The knee is the complex joint between your thigh and your lower leg. It is made up of bones, tendons, ligaments, and cartilage. The bones that make up the knee are:   The femur in the thigh.   The tibia and fibula in the lower leg.   The patella or kneecap riding in the groove on the lower femur.  CAUSES   Knee pain is a common complaint with many causes. A few of these causes are:   Injury, such as:   A ruptured ligament or tendon injury.   Torn cartilage.   Medical conditions, such as:   Gout   Arthritis   Infections   Overuse, over training or overdoing a physical activity.  Knee pain can be minor or severe. Knee pain can accompany debilitating injury. Minor knee problems often respond well to self-care measures or get well on their own. More serious injuries may need medical intervention or even surgery.  SYMPTOMS  The knee is complex. Symptoms of knee problems can vary widely. Some of the problems are:   Pain with movement and weight bearing.   Swelling and tenderness.   Buckling of the knee.   Inability to straighten or extend your knee.   Your knee locks and you cannot straighten it.   Warmth and redness with pain and fever.   Deformity or dislocation of the kneecap.  DIAGNOSIS   Determining what is wrong may be very straight forward such as when there is an injury. It can also be challenging because of the complexity of the knee. Tests to make a diagnosis may include:   Your caregiver taking a history and doing a physical exam.   Routine X-rays can be used to rule out other problems. X-rays will not reveal a cartilage tear. Some injuries of the knee can be diagnosed by:   Arthroscopy a surgical technique by which a small video camera is inserted through tiny incisions on the sides of the knee. This procedure is used to examine and repair internal knee joint problems. Tiny instruments can be used during arthroscopy to repair the torn knee cartilage (meniscus).   Arthrography  is a radiology technique. A contrast liquid is directly injected into the knee joint. Internal structures of the knee joint then become visible on X-ray film.   An MRI scan is a non x-ray radiology procedure in which magnetic fields and a computer produce two- or three-dimensional images of the inside of the knee. Cartilage tears are often visible using an MRI scanner. MRI scans have largely replaced arthrography in diagnosing cartilage tears of the knee.   Blood work.   Examination of the fluid that helps to lubricate the knee joint (synovial fluid). This is done by taking a sample out using a needle and a syringe.  TREATMENT  The treatment of knee problems depends on the cause. Some of these treatments are:   Depending on the injury, proper casting, splinting, surgery or physical therapy care will be needed.   Give yourself adequate recovery time. Do not overuse your joints. If you begin to get sore during workout routines, back off. Slow down or do fewer repetitions.   For repetitive activities such as cycling or running, maintain your strength and nutrition.   Alternate muscle groups. For example if you are a weight lifter, work the upper body on one day and the lower body the next.   Either tight or weak muscles do not give the proper support for your   knee. Tight or weak muscles do not absorb the stress placed on the knee joint. Keep the muscles surrounding the knee strong.   Take care of mechanical problems.   If you have flat feet, orthotics or special shoes may help. See your caregiver if you need help.   Arch supports, sometimes with wedges on the inner or outer aspect of the heel, can help. These can shift pressure away from the side of the knee most bothered by osteoarthritis.   A brace called an "unloader" brace also may be used to help ease the pressure on the most arthritic side of the knee.   If your caregiver has prescribed crutches, braces, wraps or ice, use as directed. The acronym for  this is PRICE. This means protection, rest, ice, compression and elevation.   Nonsteroidal anti-inflammatory drugs (NSAID's), can help relieve pain. But if taken immediately after an injury, they may actually increase swelling. Take NSAID's with food in your stomach. Stop them if you develop stomach problems. Do not take these if you have a history of ulcers, stomach pain or bleeding from the bowel. Do not take without your caregiver's approval if you have problems with fluid retention, heart failure, or kidney problems.   For ongoing knee problems, physical therapy may be helpful.   Glucosamine and chondroitin are over-the-counter dietary supplements. Both may help relieve the pain of osteoarthritis in the knee. These medicines are different from the usual anti-inflammatory drugs. Glucosamine may decrease the rate of cartilage destruction.   Injections of a corticosteroid drug into your knee joint may help reduce the symptoms of an arthritis flare-up. They may provide pain relief that lasts a few months. You may have to wait a few months between injections. The injections do have a small increased risk of infection, water retention and elevated blood sugar levels.   Hyaluronic acid injected into damaged joints may ease pain and provide lubrication. These injections may work by reducing inflammation. A series of shots may give relief for as long as 6 months.   Topical painkillers. Applying certain ointments to your skin may help relieve the pain and stiffness of osteoarthritis. Ask your pharmacist for suggestions. Many over the-counter products are approved for temporary relief of arthritis pain.   In some countries, doctors often prescribe topical NSAID's for relief of chronic conditions such as arthritis and tendinitis. A review of treatment with NSAID creams found that they worked as well as oral medications but without the serious side effects.  PREVENTION   Maintain a healthy weight. Extra pounds put  more strain on your joints.   Get strong, stay limber. Weak muscles are a common cause of knee injuries. Stretching is important. Include flexibility exercises in your workouts.   Be smart about exercise. If you have osteoarthritis, chronic knee pain or recurring injuries, you may need to change the way you exercise. This does not mean you have to stop being active. If your knees ache after jogging or playing basketball, consider switching to swimming, water aerobics or other low-impact activities, at least for a few days a week. Sometimes limiting high-impact activities will provide relief.   Make sure your shoes fit well. Choose footwear that is right for your sport.   Protect your knees. Use the proper gear for knee-sensitive activities. Use kneepads when playing volleyball or laying carpet. Buckle your seat belt every time you drive. Most shattered kneecaps occur in car accidents.   Rest when you are tired.  SEEK MEDICAL CARE IF:     You have knee pain that is continual and does not seem to be getting better.   SEEK IMMEDIATE MEDICAL CARE IF:   Your knee joint feels hot to the touch and you have a high fever.  MAKE SURE YOU:    Understand these instructions.   Will watch your condition.   Will get help right away if you are not doing well or get worse.  Document Released: 07/18/2007 Document Revised: 12/13/2011 Document Reviewed: 07/18/2007  [employer] Patient Information 2014 [employer].

## 2013-06-27 NOTE — Assessment & Plan Note (Signed)
Plain film done in the ER only showed DDD He wants something else for pain so I wrote for vicodin, he will continue flexeril and celebrex

## 2013-06-28 ENCOUNTER — Telehealth: Payer: Self-pay | Admitting: Internal Medicine

## 2013-06-28 ENCOUNTER — Other Ambulatory Visit: Payer: Medicare Other

## 2013-06-28 NOTE — Telephone Encounter (Signed)
FYI  Spoke with patient while down in the lab and advised per last two office notes, no orders, or anything indicated that stool collection was needed. I also advised that none of the other partners here would order without information or dx indicating specimen needed to be collected. Patient aware that MD is out of the office until next Tuesday 07/03/13.

## 2013-06-28 NOTE — Telephone Encounter (Signed)
Patient took stole sample to lab but there was not an  Order in the computer. Patient states that Dr. Yetta Barre told him to bring a sample in.  Please give a call in regards.

## 2013-06-29 ENCOUNTER — Encounter (INDEPENDENT_AMBULATORY_CARE_PROVIDER_SITE_OTHER): Payer: Self-pay

## 2013-07-04 ENCOUNTER — Encounter: Payer: Self-pay | Admitting: Neurology

## 2013-07-04 ENCOUNTER — Ambulatory Visit (INDEPENDENT_AMBULATORY_CARE_PROVIDER_SITE_OTHER): Payer: Medicare Other | Admitting: Neurology

## 2013-07-04 VITALS — BP 101/64 | HR 84 | Ht 72.0 in | Wt 183.0 lb

## 2013-07-04 DIAGNOSIS — F418 Other specified anxiety disorders: Secondary | ICD-10-CM

## 2013-07-04 DIAGNOSIS — R7401 Elevation of levels of liver transaminase levels: Secondary | ICD-10-CM

## 2013-07-04 DIAGNOSIS — G250 Essential tremor: Secondary | ICD-10-CM

## 2013-07-04 DIAGNOSIS — E1139 Type 2 diabetes mellitus with other diabetic ophthalmic complication: Secondary | ICD-10-CM

## 2013-07-04 DIAGNOSIS — G4733 Obstructive sleep apnea (adult) (pediatric): Secondary | ICD-10-CM

## 2013-07-04 DIAGNOSIS — I10 Essential (primary) hypertension: Secondary | ICD-10-CM

## 2013-07-04 DIAGNOSIS — F341 Dysthymic disorder: Secondary | ICD-10-CM

## 2013-07-04 NOTE — Progress Notes (Signed)
[facility]  PATIENT: Charles Parker DOB: [birth year]  HISTORICAL  This being said is a 67 years old right-handed African American male, referred by his primary care physician Dr. Sanda Linger, accompanied by his girlfriend of 4 months at today's clinical visit  He had a past medical history of hyperlipidemia, diabetes, depression anxiety  Complains of bilateral hand tremor for one year, involving bilateral hands, right more than left, sometimes drop things from his hands, he also complains of mild gait difficulty, left leg gave out underneath him, he denies significant leg pain, he denies joints pain.  He denies loss sense of smell, he was noted to catch his breath in in the middle of the sleeping, also complains of excessive daytime sleepiness, today's ESS score is 15, S. test score is 53,  He had a history of laparoscopic lower anterior resection, proctoscopy, bladder repair in May 2013,  He recovered very well from the surgical procedure,   He complains of tremor for one year, bilateral hand tremor, left leg gave out,  Right more than left hand tremor, late evening, at night, tired,  More bou when holding obeject,  He was also noted to have mild short-term memory trouble, tends to repeat himself, hard of hearing,    REVIEW OF SYSTEMS: Full 14 system review of systems performed and notable only for weight loss, blurry vision, double vision, urination problem, memory loss, confusion, headaches, numbness, weakness, dizziness, sleepiness, restless leg, depression, anxiety, not enough sleep, decreased energy  ALLERGIES: Allergies  Allergen Reactions  . Codeine Rash    HOME MEDICATIONS: Outpatient Prescriptions Prior to Visit  Medication Sig Dispense Refill  . celecoxib (CELEBREX) 200 MG capsule Take 200 mg by mouth daily.      . ciprofloxacin (CIPRO) 500 MG tablet Take 500 mg by mouth 2 (two) times daily.      . cyclobenzaprine (FLEXERIL) 10 MG tablet Take 1  tablet (10 mg total) by mouth 2 (two) times daily as needed for muscle spasms.  10 tablet  0  . glimepiride (AMARYL) 2 MG tablet Take 2 mg by mouth daily before breakfast.      . metFORMIN (GLUCOPHAGE) 1000 MG tablet       . sertraline (ZOLOFT) 50 MG tablet Take 1 tablet (50 mg total) by mouth daily.  30 tablet  11  . simvastatin (ZOCOR) 40 MG tablet Take 1 tablet (40 mg total) by mouth every evening.  90 tablet  3  . terbinafine (LAMISIL) 250 MG tablet Take 250 mg by mouth daily.      . traMADol (ULTRAM) 50 MG tablet Take 1 tablet (50 mg total) by mouth every 8 (eight) hours as needed for pain.  30 tablet  0  . valsartan-hydrochlorothiazide (DIOVAN-HCT) 320-12.5 MG per tablet          PAST MEDICAL HISTORY: Past Medical History  Diagnosis Date  . Hypertension   . Diabetes mellitus   . Tubulovillous adenoma   . Hypercholesterolemia     PAST SURGICAL HISTORY: Past Surgical History  Procedure Laterality Date  . Knee surgery  2004    left  . Colonoscopy    . Proctoscopy  02/03/2012    Procedure: PROCTOSCOPY;  Surgeon: Ardeth Sportsman, MD;  Location: [facility];  Service: General;;  . Bladder neck reconstruction  02/03/2012    Procedure: BLADDER NECK REPAIR;  Surgeon: Ardeth Sportsman, MD;  Location: [facility];  Service: General;;    FAMILY HISTORY: Father died at  age 74 Mother died at age 69, with DM.  SOCIAL HISTORY:  History   Social History  . Marital Status: Widowed    Spouse Name: N/A    Number of Children: 1  . Years of Education: 12   Occupational History  .      retired   Social History Main Topics  . Smoking status: Never Smoker   . Smokeless tobacco: Never Used  . Alcohol Use: No  . Drug Use: No  . Sexual Activity: Not Currently    Social History Narrative   Patient lives at home alone.. Patient is retired. Patient has high school education.   Right handed.- Both   Caffeine- Coffee and soda     PHYSICAL EXAM   Filed Vitals:   07/04/13 0958  BP: 101/64    Pulse: 84  Height: 6' (1.829 m)  Weight: 183 lb (83.008 kg)     Body mass index is 24.81 kg/(m^2).   Generalized: In no acute distress  Neck: Supple, no carotid bruits   Cardiac: Regular rate rhythm  Pulmonary: Clear to auscultation bilaterally  Musculoskeletal: No deformity  Neurological examination  Mentation: Alert oriented to time, place, history taking, and causual conversation Mini-Mental Status Examination is 27 out of 30, he makes 2 mistakes in spelling world backwards, has difficulty copy design,   Cranial nerve II-XII: Pupils were equal round reactive to light extraocular movements were full, visual field were full on confrontational test. facial sensation and strength were normal.  hard of hearing  bilaterally. Uvula tongue midline.  head turning and shoulder shrug and were normal and symmetric.Tongue protrusion into cheek strength was normal.  Motor:  he has mild left arm fixation on rapid rotating movement, mild to moderate bilateral upper extremity rigidity  Sensory, normal to light touch,  pinprick, preserved vibratory sensation, and proprioception at toes.  Coordination: Normal finger to nose, heel-to-shin bilaterally there was no truncal ataxia  Gait: Rising up from seated position without assistance, mildly wide based, cautious gait Romberg signs: Negative  Deep tendon reflexes: Brachioradialis 2/2, biceps 2/2, triceps 2/2, patellar 3/3 Achilles 2/2, plantar responses were flexor bilaterally.   DIAGNOSTIC DATA (LABS, IMAGING, TESTING) - I reviewed patient records, labs, notes, testing and imaging myself where available.  Lab Results  Component Value Date   WBC 3.1* 06/20/2013   HGB 12.5* 06/20/2013   HCT 36.4* 06/20/2013   MCV 92.8 06/20/2013   PLT 157.0 06/20/2013      Component Value Date/Time   NA 140 06/20/2013 1143   K 3.7 06/20/2013 1143   CL 109 06/20/2013 1143   CO2 26 06/20/2013 1143   GLUCOSE 86 06/20/2013 1143   BUN 12 06/20/2013 1143    CREATININE 0.9 06/20/2013 1143   CALCIUM 9.1 06/20/2013 1143   PROT 6.4 06/20/2013 1143   ALBUMIN 3.8 06/20/2013 1143   AST 29 06/20/2013 1143   ALT 22 06/20/2013 1143   ALKPHOS 43 06/20/2013 1143   BILITOT 0.9 06/20/2013 1143   GFRNONAA 69* 02/04/2012 0440   GFRAA 80* 02/04/2012 0440   Lab Results  Component Value Date   CHOL 186 06/20/2013   HDL 44.70 06/20/2013   LDLCALC 113* 06/20/2013   TRIG 144.0 06/20/2013   CHOLHDL 4 06/20/2013   Lab Results  Component Value Date   HGBA1C 6.4 06/20/2013   Lab Results  Component Value Date   VITAMINB12 291 06/20/2013   Lab Results  Component Value Date   TSH 0.95 06/20/2013  ASSESSMENT AND PLAN   67 years old [place] American male, with past medical history of diabetes, hyperlipidemia, depression anxiety, mild short-term memory trouble, complains of bilateral hands tremor, on examination, his Mini-Mental Status Examination is 27 out of 30, he has difficulty copy design, making a mistake in spelling words backwards, he also has mild left-sided weakness, complains of excessive daytime sleepiness, fatigue, ESS score was 15, FSS was 53.   1. Need to rule out right hemisphere stroke, proceed with MRI of the brain. 2.  I will also refer him to a sleep study because possible obstructive sleep apnea, excessive daytime sleepiness 3 return to clinic in 2 months.        Levert Feinstein, M.D. Ph.D.  [facility] [address] [place], [place] [address] [phone]

## 2013-07-05 ENCOUNTER — Telehealth: Payer: Self-pay | Admitting: *Deleted

## 2013-07-05 NOTE — Telephone Encounter (Signed)
Informed per  Dr Theotis Burrow orders to begin Lamisil as Hepatic Function labs of 06/28/2013 were normal.  See labs of 06/28/2013

## 2013-07-17 ENCOUNTER — Ambulatory Visit
Admission: RE | Admit: 2013-07-17 | Discharge: 2013-07-17 | Disposition: A | Discharge status: Home / Self Care | Payer: Medicare Other | Source: Ambulatory Visit | Attending: Neurology | Admitting: Neurology

## 2013-07-17 DIAGNOSIS — R7401 Elevation of levels of liver transaminase levels: Secondary | ICD-10-CM

## 2013-07-17 DIAGNOSIS — G4733 Obstructive sleep apnea (adult) (pediatric): Secondary | ICD-10-CM

## 2013-07-17 DIAGNOSIS — I6789 Other cerebrovascular disease: Secondary | ICD-10-CM

## 2013-07-17 DIAGNOSIS — F418 Other specified anxiety disorders: Secondary | ICD-10-CM

## 2013-07-17 DIAGNOSIS — I10 Essential (primary) hypertension: Secondary | ICD-10-CM

## 2013-07-17 DIAGNOSIS — E1139 Type 2 diabetes mellitus with other diabetic ophthalmic complication: Secondary | ICD-10-CM

## 2013-07-19 ENCOUNTER — Ambulatory Visit: Payer: Medicare Other | Admitting: *Deleted

## 2013-07-23 ENCOUNTER — Encounter: Payer: Self-pay | Admitting: Pulmonary Disease

## 2013-07-23 ENCOUNTER — Ambulatory Visit (INDEPENDENT_AMBULATORY_CARE_PROVIDER_SITE_OTHER): Payer: Medicare Other | Admitting: Pulmonary Disease

## 2013-07-23 ENCOUNTER — Telehealth: Payer: Self-pay | Admitting: Internal Medicine

## 2013-07-23 VITALS — BP 122/70 | HR 90 | Temp 97.9°F | Ht 72.0 in | Wt 185.4 lb

## 2013-07-23 DIAGNOSIS — G4733 Obstructive sleep apnea (adult) (pediatric): Secondary | ICD-10-CM

## 2013-07-23 NOTE — Progress Notes (Signed)
Subjective:    Patient ID: Charles Parker, male    DOB: 10/04/1946, 67 y.o.   MRN: [id number]  HPI The patient is a 67 year old male who I've been asked to see for possible obstructive sleep apnea.  He has been noted by his bed partner to have snoring and clicking during sleep, as well as an abnormal breathing pattern.  He has frequent awakenings at night, and his un- rested at least 60% in the mornings.  He notes significant sleepiness during the day with inactivity, and will fall asleep in the evenings watching television.  He has occasional sleepiness with driving.  The patient states that his weight is actually down 25 pounds over the last one year, and his Epworth score is 15.  It should be noted that he has had an attempt at home sleep testing, and was unable to complete this adequately.   Sleep Questionnaire What time do you typically go to bed?( Between what hours) 930p 930p at 1413 on 07/23/13 by Maisie Fus, CMA How long does it take you to fall asleep? 10-12min 10-15min at 1413 on 07/23/13 by Maisie Fus, CMA How many times during the night do you wake up? No Value several at 1413 on 07/23/13 by Maisie Fus, CMA What time do you get out of bed to start your day? No Value 7-8am most days at 1413 on 07/23/13 by Maisie Fus, CMA Do you drive or operate heavy machinery in your occupation? No No at 1413 on 07/23/13 by Maisie Fus, CMA How much has your weight changed (up or down) over the past two years? (In pounds) 25 lb (11.34 kg)25 lb (11.34 kg) decreased in the last year at 1413 on 07/23/13 by Maisie Fus, CMA Have you ever had a sleep study before? No No at 1413 on 07/23/13 by Maisie Fus, CMA Do you currently use CPAP? No No at 1413 on 07/23/13 by Maisie Fus, CMA Do you wear oxygen at any time? No No at 1413 on 07/23/13 by Maisie Fus, CMA   Review of Systems  Constitutional: Negative for fever and unexpected weight change.  HENT:  Negative for congestion, dental problem, ear pain, nosebleeds, postnasal drip, rhinorrhea, sinus pressure, sneezing, sore throat and trouble swallowing.   Eyes: Negative for redness and itching.  Respiratory: Negative for cough, chest tightness, shortness of breath and wheezing.   Cardiovascular: Negative for palpitations and leg swelling.  Gastrointestinal: Positive for diarrhea. Negative for nausea and vomiting.  Genitourinary: Negative for dysuria.  Musculoskeletal: Negative for joint swelling.  Skin: Negative for rash.  Neurological: Negative for headaches.  Hematological: Does not bruise/bleed easily.  Psychiatric/Behavioral: Positive for dysphoric mood. The patient is nervous/anxious.        Objective:   Physical Exam Constitutional:  Well developed, no acute distress  HENT:  Nares patent without discharge but narrowed  Oropharynx without exudate, palate and uvula are normal  Large neck  Eyes:  Perrla, eomi, no scleral icterus  Neck:  No JVD, no TMG  Cardiovascular:  Normal rate, regular rhythm, no rubs or gallops.  No murmurs        Intact distal pulses  Pulmonary :  Normal breath sounds, no stridor or respiratory distress   No rales, rhonchi, or wheezing  Abdominal:  Soft, nondistended, bowel sounds present.  No tenderness noted.   Musculoskeletal:  No lower extremity edema noted.  Lymph Nodes:  No cervical lymphadenopathy noted  Skin:  No cyanosis  noted  Neurologic:  Alert, appropriate, moves all 4 extremities without obvious deficit.         Assessment & Plan:

## 2013-07-23 NOTE — Patient Instructions (Signed)
Will arrange for sleep study at sleep center. Will arrange for followup once the results are available.

## 2013-07-23 NOTE — Assessment & Plan Note (Signed)
Patient's history is suggestive of some type of sleep disorder breathing, but he is not obese.  He is clearly having nonrestorative sleep and daytime sleepiness, and will need a sleep study to sort all of this out.  It had a long discussion with him about the pathophysiology of sleep apnea, and he agrees to proceed with a sleep study.

## 2013-07-23 NOTE — Progress Notes (Signed)
Quick Note:  Please call patient, MRI brain showed mild changes of chronic microvascular ischemia and generalized cerebral atrophy ______

## 2013-07-23 NOTE — Telephone Encounter (Signed)
Patient is suppose to be on heart meds.  Patient was notified when he went to see pulmonary today. Please give a call in regards.

## 2013-07-24 ENCOUNTER — Encounter: Payer: Self-pay | Admitting: *Deleted

## 2013-07-24 NOTE — Progress Notes (Signed)
Quick Note:  I called pt and then he handed it off to [facility], his fiance, re: his MRI results (as per below) were relayed. No stroke. Letter mailed to pt. ______

## 2013-07-24 NOTE — Telephone Encounter (Signed)
Returned call to pt who was not home, spoke with fiance but I did not disclose any medical information. She stated that pt is unable to keep food down, requested MRI, and advised that pt does not recall being on BP meds. I advised pt need to schedule appointment with PCP to discuss multiple concerns. Appointment scheduled for 07/27/13.

## 2013-07-27 ENCOUNTER — Ambulatory Visit (INDEPENDENT_AMBULATORY_CARE_PROVIDER_SITE_OTHER): Payer: Medicare Other | Admitting: Internal Medicine

## 2013-07-27 ENCOUNTER — Encounter: Payer: Self-pay | Admitting: Internal Medicine

## 2013-07-27 VITALS — BP 132/82 | HR 96 | Temp 98.1°F | Resp 16 | Ht 72.0 in | Wt 186.0 lb

## 2013-07-27 DIAGNOSIS — R0989 Other specified symptoms and signs involving the circulatory and respiratory systems: Secondary | ICD-10-CM

## 2013-07-27 DIAGNOSIS — R0600 Dyspnea, unspecified: Secondary | ICD-10-CM

## 2013-07-27 DIAGNOSIS — E785 Hyperlipidemia, unspecified: Secondary | ICD-10-CM

## 2013-07-27 DIAGNOSIS — R0609 Other forms of dyspnea: Secondary | ICD-10-CM

## 2013-07-27 DIAGNOSIS — D126 Benign neoplasm of colon, unspecified: Secondary | ICD-10-CM

## 2013-07-27 DIAGNOSIS — T8189XA Other complications of procedures, not elsewhere classified, initial encounter: Secondary | ICD-10-CM

## 2013-07-27 DIAGNOSIS — Z9049 Acquired absence of other specified parts of digestive tract: Secondary | ICD-10-CM | POA: Insufficient documentation

## 2013-07-27 DIAGNOSIS — D638 Anemia in other chronic diseases classified elsewhere: Secondary | ICD-10-CM

## 2013-07-27 DIAGNOSIS — I10 Essential (primary) hypertension: Secondary | ICD-10-CM

## 2013-07-27 DIAGNOSIS — E1139 Type 2 diabetes mellitus with other diabetic ophthalmic complication: Secondary | ICD-10-CM

## 2013-07-27 DIAGNOSIS — R197 Diarrhea, unspecified: Secondary | ICD-10-CM | POA: Insufficient documentation

## 2013-07-27 MED ORDER — COLESEVELAM HCL 3.75 G PO PACK: 1.0000 | PACK | Freq: Every day | ORAL | Status: DC

## 2013-07-27 NOTE — Patient Instructions (Signed)

## 2013-07-27 NOTE — Assessment & Plan Note (Signed)
His BP is well controlled 

## 2013-07-27 NOTE — Assessment & Plan Note (Signed)
His exam and EKG are normal He has risk factors for CAD so I have asked him to get an ETT done

## 2013-07-27 NOTE — Progress Notes (Signed)
Subjective:    Patient ID: Charles Parker, male    DOB: 21-Oct-1945, 67 y.o.   MRN: [id number]  Shortness of Breath This is a recurrent problem. Episode onset: for 4-5 months. The problem occurs intermittently. The problem has been unchanged. Pertinent negatives include no abdominal pain, chest pain, claudication, coryza, ear pain, fever, headaches, hemoptysis, leg pain, leg swelling, neck pain, orthopnea, PND, rash, rhinorrhea, sore throat, sputum production, swollen glands, syncope, vomiting or wheezing. There is no history of CAD, a heart failure or PE.      Review of Systems  Constitutional: Negative.  Negative for fever, chills, diaphoresis, appetite change and fatigue.  HENT: Negative.  Negative for ear pain, rhinorrhea and sore throat.   Eyes: Negative.   Respiratory: Positive for shortness of breath. Negative for apnea, cough, hemoptysis, sputum production, choking, chest tightness, wheezing and stridor.   Cardiovascular: Negative.  Negative for chest pain, orthopnea, claudication, leg swelling, syncope and PND.  Gastrointestinal: Positive for diarrhea (chronic,intermittent diarrhea since surgery 1.5 years ago). Negative for nausea, vomiting, abdominal pain, constipation and blood in stool.  Endocrine: Negative.   Genitourinary: Negative.   Musculoskeletal: Negative.  Negative for neck pain.  Skin: Negative.  Negative for rash.  Allergic/Immunologic: Negative.   Neurological: Negative.  Negative for dizziness, tremors, weakness, light-headedness and headaches.  Hematological: Negative.  Negative for adenopathy. Does not bruise/bleed easily.  Psychiatric/Behavioral: Negative.        Objective:   Physical Exam  Vitals reviewed. Constitutional: He is oriented to person, place, and time. He appears well-developed and well-nourished. No distress.  HENT:  Head: Normocephalic and atraumatic.  Mouth/Throat: Oropharynx is clear and moist. No oropharyngeal exudate.  Eyes:  Conjunctivae are normal. Right eye exhibits no discharge. Left eye exhibits no discharge. No scleral icterus.  Neck: Normal range of motion. Neck supple. No JVD present. No tracheal deviation present. No thyromegaly present.  Cardiovascular: Normal rate, regular rhythm, normal heart sounds and intact distal pulses.  Exam reveals no gallop and no friction rub.   No murmur heard. Pulmonary/Chest: Effort normal and breath sounds normal. No stridor. No respiratory distress. He has no wheezes. He has no rales. He exhibits no tenderness.  Abdominal: Soft. Bowel sounds are normal. He exhibits no distension and no mass. There is no tenderness. There is no rebound and no guarding.  Musculoskeletal: Normal range of motion. He exhibits no edema and no tenderness.  Lymphadenopathy:    He has no cervical adenopathy.  Neurological: He is alert and oriented to person, place, and time. He has normal reflexes. He displays normal reflexes. No cranial nerve deficit. He exhibits normal muscle tone. Coordination normal.  Skin: Skin is warm and dry. No rash noted. He is not diaphoretic. No erythema. No pallor.  Psychiatric: He has a normal mood and affect. His behavior is normal. Judgment and thought content normal.     Lab Results  Component Value Date   WBC 3.1* 06/20/2013   HGB 12.5* 06/20/2013   HCT 36.4* 06/20/2013   PLT 157.0 06/20/2013   GLUCOSE 86 06/20/2013   CHOL 186 06/20/2013   TRIG 144.0 06/20/2013   HDL 44.70 06/20/2013   LDLCALC 113* 06/20/2013   ALT 22 06/20/2013   AST 29 06/20/2013   NA 140 06/20/2013   K 3.7 06/20/2013   CL 109 06/20/2013   CREATININE 0.9 06/20/2013   BUN 12 06/20/2013   CO2 26 06/20/2013   TSH 0.95 06/20/2013   PSA 2.40 06/20/2013   INR 2.1*  05/26/2009   HGBA1C 6.4 06/20/2013   MICROALBUR 2.5* 06/20/2013       Assessment & Plan:

## 2013-07-27 NOTE — Assessment & Plan Note (Signed)
Will try welchol for this also he requests a GI follow up

## 2013-07-27 NOTE — Assessment & Plan Note (Signed)
Stable H/H and elevated ferritin over the last 1.5 years

## 2013-07-27 NOTE — Assessment & Plan Note (Signed)
I will add welchol to the statin

## 2013-07-30 ENCOUNTER — Ambulatory Visit: Payer: Medicare Other | Admitting: Internal Medicine

## 2013-08-08 ENCOUNTER — Telehealth: Payer: Self-pay | Admitting: *Deleted

## 2013-08-08 DIAGNOSIS — S161XXA Strain of muscle, fascia and tendon at neck level, initial encounter: Secondary | ICD-10-CM

## 2013-08-08 DIAGNOSIS — S8992XA Unspecified injury of left lower leg, initial encounter: Secondary | ICD-10-CM

## 2013-08-08 MED ORDER — TRAMADOL HCL 50 MG PO TABS: 50.0000 mg | ORAL_TABLET | Freq: Three times a day (TID) | ORAL | Status: DC | PRN

## 2013-08-08 NOTE — Telephone Encounter (Signed)
Pt called requesting Tramadol refill.  Please advise 

## 2013-08-09 ENCOUNTER — Other Ambulatory Visit: Payer: Self-pay | Admitting: Internal Medicine

## 2013-08-20 ENCOUNTER — Ambulatory Visit (HOSPITAL_BASED_OUTPATIENT_CLINIC_OR_DEPARTMENT_OTHER): Payer: Medicare Other | Attending: Pulmonary Disease | Admitting: Radiology

## 2013-08-20 VITALS — Ht 72.0 in | Wt 185.0 lb

## 2013-08-20 DIAGNOSIS — G4733 Obstructive sleep apnea (adult) (pediatric): Secondary | ICD-10-CM

## 2013-08-20 DIAGNOSIS — G4710 Hypersomnia, unspecified: Secondary | ICD-10-CM | POA: Insufficient documentation

## 2013-08-24 DIAGNOSIS — G4710 Hypersomnia, unspecified: Secondary | ICD-10-CM

## 2013-08-24 DIAGNOSIS — G4730 Sleep apnea, unspecified: Secondary | ICD-10-CM

## 2013-08-24 DIAGNOSIS — G4733 Obstructive sleep apnea (adult) (pediatric): Secondary | ICD-10-CM

## 2013-08-25 NOTE — Procedures (Signed)
Charles Parker, SCHMADER NO.:  [id number]  MEDICAL RECORD NO.:  [id number]          PATIENT TYPE:  OUT  LOCATION:  SLEEP CENTER                 FACILITY:  [facility]  PHYSICIAN:  Barbaraann Share, MD,FCCPDATE OF BIRTH:  06/28[birth year]1946  DATE OF STUDY:  08/20/2013                           NOCTURNAL POLYSOMNOGRAM  REFERRING PHYSICIAN:  Barbaraann Share, MD,FCCP  REFERRING PHYSICIAN:  Barbaraann Share, MD,FCCP  INDICATION FOR STUDY:  Hypersomnia with sleep apnea.  EPWORTH SLEEPINESS SCORE:  15.  SLEEP ARCHITECTURE:  The patient had a total sleep time of 249 minutes with no slow-wave sleep and 77 minutes of REM.  Sleep onset latency was prolonged at 81 minutes and REM onset was rapid at 19 minutes.  Sleep efficiency was very poor at 56%.  RESPIRATORY DATA:  The patient was found to have 2 apneas and 10 obstructive hypopneas, giving him an apnea-hypopnea index of 3 events per hour.  The events occurred in all body positions, and there was mild snoring noted throughout.  OXYGEN DATA:  There was transient O2 desaturation as low as 88% with the patient's events.  CARDIAC DATA:  Occasional PVC noted, but no clinically significant arrhythmias were seen.  MOVEMENT/PARASOMNIA:  The patient had no significant leg jerks or other behavioral abnormalities.  IMPRESSION/RECOMMENDATION: 1. Small numbers of obstructive events which do not meet the AHI     criteria for the obstructive sleep apnea syndrome.  The patient     should be encouraged to work on modest weight loss if applicable,     as well as positional therapy. 2. Occasional premature ventricular contraction noted, but no     clinically significant arrhythmias were seen.     Barbaraann Share, MD,FCCP Diplomate, American Board of Sleep Medicine   [facility]  D:  08/24/2013 08:40:39  T:  08/24/2013 23:56:59  Job:  [id number]

## 2013-08-27 ENCOUNTER — Telehealth: Payer: Self-pay | Admitting: *Deleted

## 2013-08-27 ENCOUNTER — Telehealth: Payer: Self-pay | Admitting: Pulmonary Disease

## 2013-08-27 NOTE — Telephone Encounter (Signed)
Very mild sleep apnea - no treatment needed but it was recommended that he lose weight

## 2013-08-27 NOTE — Telephone Encounter (Signed)
Spoke with pt advised of MDs message 

## 2013-08-27 NOTE — Telephone Encounter (Signed)
Pt called requesting Sleep study results.  Please advise

## 2013-08-27 NOTE — Telephone Encounter (Signed)
Please let pt know that his sleep study did not show a cause for his complaints.  He had very little sleep apnea, no leg kicks, and nothing to explain his disrupted sleep.  Would look closely at bedroom environment with respect to temperature, light, sound, pillow, mattress, bed partner, pets, etc.

## 2013-08-28 NOTE — Telephone Encounter (Signed)
Results have been explained to patient, pt expressed understanding. Nothing further needed.  

## 2013-09-10 ENCOUNTER — Encounter (INDEPENDENT_AMBULATORY_CARE_PROVIDER_SITE_OTHER): Payer: Self-pay

## 2013-09-10 ENCOUNTER — Ambulatory Visit (INDEPENDENT_AMBULATORY_CARE_PROVIDER_SITE_OTHER): Payer: Self-pay | Admitting: Physician Assistant

## 2013-09-10 DIAGNOSIS — R0600 Dyspnea, unspecified: Secondary | ICD-10-CM

## 2013-09-10 DIAGNOSIS — R0609 Other forms of dyspnea: Secondary | ICD-10-CM

## 2013-09-10 DIAGNOSIS — R0989 Other specified symptoms and signs involving the circulatory and respiratory systems: Secondary | ICD-10-CM

## 2013-09-10 NOTE — Progress Notes (Signed)
Exercise Treadmill Test Charles Parker is a 67 y.o. male non-smoker with T2DM, HTN, HL, remote FHx of CAD who is referred by his PCP for ETT b/c of hx of exertional chest pain and dyspnea.  No syncope.  Exam unremarkable.  ECG: NSR, HR 68, no ST changes.    Pre-Exercise Testing Evaluation Rhythm: normal sinus  Rate: 68     Test  Exercise Tolerance Test Ordering MD: Olga Millers, MD  Interpreting MD: Tereso Newcomer, PA-C  Unique Test No: 1  Treadmill:  1  Indication for ETT: exertional dyspnea  Contraindication to ETT: No   Stress Modality: exercise - treadmill  Cardiac Imaging Performed: non   Protocol: standard Bruce - maximal  Max BP:  194/100  Max MPHR (bpm):  137 85% MPR (bpm):  89  MPHR obtained (bpm):  153 % MPHR obtained: 130  Reached 85% MPHR (min:sec):  6:20 Total Exercise Time (min-sec):  6:30  Workload in METS:  7.7 Borg Scale: 18  Reason ETT Terminated:  desired heart rate attained    ST Segment Analysis At Rest: normal ST segments - no evidence of significant ST depression With Exercise: non-specific ST changes  Other Information Arrhythmia:  No Angina during ETT:  absent (0) Quality of ETT:  diagnostic  ETT Interpretation:  normal - no evidence of ischemia by ST analysis  Comments: Fair exercise capacity. No chest pain. Normal BP response to exercise. Non-specific ST changes noted, no changes to suggest ischemia.  O2 sat on RA 90-98%.  Recommendations: F/u with PCP as directed. Signed,  Tereso Newcomer, PA-C   09/10/2013 3:22 PM

## 2013-09-13 ENCOUNTER — Ambulatory Visit: Payer: Medicare Other | Admitting: Neurology

## 2013-09-24 ENCOUNTER — Encounter: Payer: Self-pay | Admitting: Podiatry

## 2013-09-24 ENCOUNTER — Ambulatory Visit (INDEPENDENT_AMBULATORY_CARE_PROVIDER_SITE_OTHER): Payer: Medicare Other | Admitting: Internal Medicine

## 2013-09-24 ENCOUNTER — Other Ambulatory Visit (INDEPENDENT_AMBULATORY_CARE_PROVIDER_SITE_OTHER): Payer: Medicare Other

## 2013-09-24 ENCOUNTER — Ambulatory Visit (INDEPENDENT_AMBULATORY_CARE_PROVIDER_SITE_OTHER): Payer: Medicare Other | Admitting: Podiatry

## 2013-09-24 ENCOUNTER — Encounter: Payer: Self-pay | Admitting: Internal Medicine

## 2013-09-24 VITALS — BP 149/90 | HR 68 | Resp 18

## 2013-09-24 VITALS — BP 110/70 | HR 60 | Temp 97.6°F | Resp 16 | Ht 72.0 in | Wt 180.0 lb

## 2013-09-24 DIAGNOSIS — A09 Infectious gastroenteritis and colitis, unspecified: Secondary | ICD-10-CM

## 2013-09-24 DIAGNOSIS — I10 Essential (primary) hypertension: Secondary | ICD-10-CM

## 2013-09-24 DIAGNOSIS — B353 Tinea pedis: Secondary | ICD-10-CM

## 2013-09-24 LAB — CBC WITH DIFFERENTIAL/PLATELET
Basophils Relative: 0.4 % (ref 0.0–3.0)
Eosinophils Absolute: 0 10*3/uL (ref 0.0–0.7)
Eosinophils Relative: 1.1 % (ref 0.0–5.0)
Lymphocytes Relative: 43.5 % (ref 12.0–46.0)
MCV: 92.2 fl (ref 78.0–100.0)
Monocytes Absolute: 0.4 10*3/uL (ref 0.1–1.0)
Neutro Abs: 1.7 10*3/uL (ref 1.4–7.7)
Neutrophils Relative %: 44 % (ref 43.0–77.0)
RBC: 4.31 Mil/uL (ref 4.22–5.81)
RDW: 13.1 % (ref 11.5–14.6)
WBC: 4 10*3/uL — ABNORMAL LOW (ref 4.5–10.5)

## 2013-09-24 MED ORDER — METRONIDAZOLE 500 MG PO TABS: 500.0000 mg | ORAL_TABLET | Freq: Three times a day (TID) | ORAL | Status: DC

## 2013-09-24 NOTE — Patient Instructions (Signed)
Diabetes and Foot Care Diabetes may cause you to have problems because of poor blood supply (circulation) to your feet and legs. This may cause the skin on your feet to become thinner, break easier, and heal more slowly. Your skin may become dry, and the skin may peel and crack. You may also have nerve damage in your legs and feet causing decreased feeling in them. You may not notice minor injuries to your feet that could lead to infections or more serious problems. Taking care of your feet is one of the most important things you can do for yourself.  HOME CARE INSTRUCTIONS  Wear shoes at all times, even in the house. Do not go barefoot. Bare feet are easily injured.  Check your feet daily for blisters, cuts, and redness. If you cannot see the bottom of your feet, use a mirror or ask someone for help.  Wash your feet with warm water (do not use hot water) and mild soap. Then pat your feet and the areas between your toes until they are completely dry. Do not soak your feet as this can dry your skin.  Apply a moisturizing lotion or petroleum jelly (that does not contain alcohol and is unscented) to the skin on your feet and to dry, brittle toenails. Do not apply lotion between your toes.  Trim your toenails straight across. Do not dig under them or around the cuticle. File the edges of your nails with an emery board or nail file.  Do not cut corns or calluses or try to remove them with medicine.  Wear clean socks or stockings every day. Make sure they are not too tight. Do not wear knee-high stockings since they may decrease blood flow to your legs.  Wear shoes that fit properly and have enough cushioning. To break in new shoes, wear them for just a few hours a day. This prevents you from injuring your feet. Always look in your shoes before you put them on to be sure there are no objects inside.  Do not cross your legs. This may decrease the blood flow to your feet.  If you find a minor scrape,  cut, or break in the skin on your feet, keep it and the skin around it clean and dry. These areas may be cleansed with mild soap and water. Do not cleanse the area with peroxide, alcohol, or iodine.  When you remove an adhesive bandage, be sure not to damage the skin around it.  If you have a wound, look at it several times a day to make sure it is healing.  Do not use heating pads or hot water bottles. They may burn your skin. If you have lost feeling in your feet or legs, you may not know it is happening until it is too late.  Make sure your health care provider performs a complete foot exam at least annually or more often if you have foot problems. Report any cuts, sores, or bruises to your health care provider immediately. SEEK MEDICAL CARE IF:   You have an injury that is not healing.  You have cuts or breaks in the skin.  You have an ingrown nail.  You notice redness on your legs or feet.  You feel burning or tingling in your legs or feet.  You have pain or cramps in your legs and feet.  Your legs or feet are numb.  Your feet always feel cold. SEEK IMMEDIATE MEDICAL CARE IF:   There is increasing redness,   swelling, or pain in or around a wound.  There is a red line that goes up your leg.  Pus is coming from a wound.  You develop a fever or as directed by your health care provider.  You notice a bad smell coming from an ulcer or wound. Document Released: 09/17/2000 Document Revised: 05/23/2013 Document Reviewed: 02/27/2013 Exit[employer] Patient Information 2014 [employer].  

## 2013-09-24 NOTE — Progress Notes (Signed)
   Subjective:    Patient ID: Charles Parker, male    DOB: 06-02-1946, 67 y.o.   MRN: [id number]  HPI I just need my toenails cut Patient is requesting evaluation for nails and trimming if necessary. In addition patient had a history of interdigital maceration and onychomycoses which was treated with Lamisil 250 mg #90 and he webspace were treated with topical Castellani's paint. He was last evaluated in June 11, 2013. He is requesting a foot evaluation at this time as well.   Review of Systems     Objective:   Physical Exam Orientated x31 67 year old black male presents with young relative.  Vascular: DP and PTs are two over four bilaterally  Dermatological: Residual dystrophic changes noted in the distal toenails x10. No maceration and webspaces 1 through 4 noted bilaterally.  Musculoskeletal. HAV deformities noted bilaterally       Assessment & Plan:   Assessment: Improving onychomycoses x10 Resolved tinea pedis webspaces bilaterally Nail plates do not need trimming at this time.  Plan: Patient advised that he's made great improvement in regards to the tinea pedis and onychomycosis bilaterally. I do not think he needs any active treatment at this time. No debridement needed at this time for the toenails. Reappoint at patient's request

## 2013-09-24 NOTE — Progress Notes (Signed)
Subjective:    Patient ID: Charles Parker, male    DOB: 1946-06-15, 67 y.o.   MRN: [id number]  Diarrhea  This is a recurrent problem. The current episode started more than 1 month ago. The problem occurs 2 to 4 times per day. The problem has been gradually worsening. The stool consistency is described as watery. The patient states that diarrhea does not awaken him from sleep. Associated symptoms include increased flatus. Pertinent negatives include no abdominal pain, arthralgias, bloating, chills, coughing, fever, headaches, myalgias, sweats, URI, vomiting or weight loss. Nothing aggravates the symptoms. Risk factors include recent antibiotic use. He has tried nothing for the symptoms. The treatment provided no relief.      Review of Systems  Constitutional: Positive for unexpected weight change. Negative for fever, chills, weight loss, diaphoresis, appetite change and fatigue.  HENT: Negative.   Respiratory: Negative.  Negative for cough.   Cardiovascular: Negative.   Gastrointestinal: Positive for diarrhea and flatus. Negative for nausea, vomiting, abdominal pain, constipation, blood in stool, abdominal distention, anal bleeding, rectal pain and bloating.  Endocrine: Negative.   Genitourinary: Negative.   Musculoskeletal: Negative.  Negative for arthralgias and myalgias.  Skin: Negative.   Allergic/Immunologic: Negative.   Neurological: Negative.  Negative for dizziness, tremors, weakness, light-headedness and headaches.  Hematological: Negative.  Negative for adenopathy. Does not bruise/bleed easily.  Psychiatric/Behavioral: Negative.        Objective:   Physical Exam  Vitals reviewed. Constitutional: He is oriented to person, place, and time. He appears well-developed and well-nourished.  Non-toxic appearance. He does not have a sickly appearance. He does not appear ill. No distress.  HENT:  Head: Normocephalic and atraumatic.  Mouth/Throat: Oropharynx is clear and moist. No  oropharyngeal exudate.  Eyes: Conjunctivae are normal. Right eye exhibits no discharge. Left eye exhibits no discharge. No scleral icterus.  Neck: Normal range of motion. Neck supple. No JVD present. No tracheal deviation present. No thyromegaly present.  Cardiovascular: Normal rate, regular rhythm, normal heart sounds and intact distal pulses.  Exam reveals no gallop and no friction rub.   No murmur heard. Pulmonary/Chest: Effort normal and breath sounds normal. No stridor. No respiratory distress. He has no wheezes. He has no rales. He exhibits no tenderness.  Abdominal: Soft. Normal appearance and bowel sounds are normal. He exhibits no shifting dullness, no distension, no pulsatile liver, no fluid wave, no abdominal bruit, no ascites, no pulsatile midline mass and no mass. There is no hepatosplenomegaly, splenomegaly or hepatomegaly. There is no tenderness. There is no rigidity, no rebound, no guarding, no CVA tenderness, no tenderness at McBurney's point and negative Murphy's sign. No hernia. Hernia confirmed negative in the ventral area, confirmed negative in the right inguinal area and confirmed negative in the left inguinal area.  Musculoskeletal: Normal range of motion. He exhibits no edema and no tenderness.  Lymphadenopathy:    He has no cervical adenopathy.  Neurological: He is oriented to person, place, and time.  Skin: Skin is warm and dry. No rash noted. He is not diaphoretic. No erythema. No pallor.      Lab Results  Component Value Date   WBC 3.1* 06/20/2013   HGB 12.5* 06/20/2013   HCT 36.4* 06/20/2013   PLT 157.0 06/20/2013   GLUCOSE 86 06/20/2013   CHOL 186 06/20/2013   TRIG 144.0 06/20/2013   HDL 44.70 06/20/2013   LDLCALC 113* 06/20/2013   ALT 22 06/20/2013   AST 29 06/20/2013   NA 140 06/20/2013  K 3.7 06/20/2013   CL 109 06/20/2013   CREATININE 0.9 06/20/2013   BUN 12 06/20/2013   CO2 26 06/20/2013   TSH 0.95 06/20/2013   PSA 2.40 06/20/2013   INR 2.1* 05/26/2009   HGBA1C  6.4 06/20/2013   MICROALBUR 2.5* 06/20/2013      Assessment & Plan:

## 2013-09-24 NOTE — Assessment & Plan Note (Signed)
I am concerned that he may have C. diff infection so I have asked him to start flagyl Will also check a CBC to look for signs of infection/blood loss As well as stool studies to try to identify if there is an infection

## 2013-09-24 NOTE — Progress Notes (Signed)
Pre visit review using our clinic review tool, if applicable. No additional management support is needed unless otherwise documented below in the visit note. 

## 2013-09-24 NOTE — Assessment & Plan Note (Signed)
His BP is well controlled 

## 2013-09-24 NOTE — Patient Instructions (Signed)
Diarrhea Diarrhea is frequent loose and watery bowel movements. It can cause you to feel weak and dehydrated. Dehydration can cause you to become tired and thirsty, have a dry mouth, and have decreased urination that often is dark yellow. Diarrhea is a sign of another problem, most often an infection that will not last long. In most cases, diarrhea typically lasts 2 3 days. However, it can last longer if it is a sign of something more serious. It is important to treat your diarrhea as directed by your caregive to lessen or prevent future episodes of diarrhea. CAUSES  Some common causes include:  Gastrointestinal infections caused by viruses, bacteria, or parasites.  Food poisoning or food allergies.  Certain medicines, such as antibiotics, chemotherapy, and laxatives.  Artificial sweeteners and fructose.  Digestive disorders. HOME CARE INSTRUCTIONS  Ensure adequate fluid intake (hydration): have 1 cup (8 oz) of fluid for each diarrhea episode. Avoid fluids that contain simple sugars or sports drinks, fruit juices, whole milk products, and sodas. Your urine should be clear or pale yellow if you are drinking enough fluids. Hydrate with an oral rehydration solution that you can purchase at pharmacies, retail stores, and online. You can prepare an oral rehydration solution at home by mixing the following ingredients together:    tsp table salt.   tsp baking soda.   tsp salt substitute containing potassium chloride.  1  tablespoons sugar.  1 L (34 oz) of water.  Certain foods and beverages may increase the speed at which food moves through the gastrointestinal (GI) tract. These foods and beverages should be avoided and include:  Caffeinated and alcoholic beverages.  High-fiber foods, such as raw fruits and vegetables, nuts, seeds, and whole grain breads and cereals.  Foods and beverages sweetened with sugar alcohols, such as xylitol, sorbitol, and mannitol.  Some foods may be well  tolerated and may help thicken stool including:  Starchy foods, such as rice, toast, pasta, low-sugar cereal, oatmeal, grits, baked potatoes, crackers, and bagels.  Bananas.  Applesauce.  Add probiotic-rich foods to help increase healthy bacteria in the GI tract, such as yogurt and fermented milk products.  Wash your hands well after each diarrhea episode.  Only take over-the-counter or prescription medicines as directed by your caregiver.  Take a warm bath to relieve any burning or pain from frequent diarrhea episodes. SEEK IMMEDIATE MEDICAL CARE IF:   You are unable to keep fluids down.  You have persistent vomiting.  You have blood in your stool, or your stools are black and tarry.  You do not urinate in 6 8 hours, or there is only a small amount of very dark urine.  You have abdominal pain that increases or localizes.  You have weakness, dizziness, confusion, or lightheadedness.  You have a severe headache.  Your diarrhea gets worse or does not get better.  You have a fever or persistent symptoms for more than 2 3 days.  You have a fever and your symptoms suddenly get worse. MAKE SURE YOU:   Understand these instructions.  Will watch your condition.  Will get help right away if you are not doing well or get worse. Document Released: 09/10/2002 Document Revised: 09/06/2012 Document Reviewed: 05/28/2012 [employer] Patient Information 2014 [employer].  

## 2013-09-25 ENCOUNTER — Encounter: Payer: Self-pay | Admitting: Internal Medicine

## 2013-09-25 LAB — FECAL LACTOFERRIN, QUANT: Lactoferrin: NEGATIVE

## 2013-09-25 LAB — CLOSTRIDIUM DIFFICILE EIA: CDIFTX: NEGATIVE

## 2013-09-25 LAB — OVA AND PARASITE EXAMINATION

## 2013-11-07 ENCOUNTER — Other Ambulatory Visit: Payer: Self-pay | Admitting: Internal Medicine

## 2013-11-12 ENCOUNTER — Encounter: Payer: Self-pay | Admitting: Internal Medicine

## 2013-11-12 ENCOUNTER — Ambulatory Visit (INDEPENDENT_AMBULATORY_CARE_PROVIDER_SITE_OTHER): Payer: Medicare Other | Admitting: Internal Medicine

## 2013-11-12 VITALS — BP 124/82 | HR 83 | Temp 98.1°F | Resp 16 | Ht 72.0 in | Wt 190.0 lb

## 2013-11-12 DIAGNOSIS — E1139 Type 2 diabetes mellitus with other diabetic ophthalmic complication: Secondary | ICD-10-CM

## 2013-11-12 DIAGNOSIS — D126 Benign neoplasm of colon, unspecified: Secondary | ICD-10-CM

## 2013-11-12 DIAGNOSIS — G319 Degenerative disease of nervous system, unspecified: Secondary | ICD-10-CM

## 2013-11-12 DIAGNOSIS — I10 Essential (primary) hypertension: Secondary | ICD-10-CM

## 2013-11-12 DIAGNOSIS — E1165 Type 2 diabetes mellitus with hyperglycemia: Principal | ICD-10-CM

## 2013-11-12 LAB — GLUCOSE, POCT (MANUAL RESULT ENTRY): POC Glucose: 208 mg/dl — AB (ref 70–99)

## 2013-11-12 MED ORDER — SITAGLIPTIN PHOSPHATE 100 MG PO TABS: 100.0000 mg | ORAL_TABLET | Freq: Every day | ORAL | Status: DC

## 2013-11-12 NOTE — Assessment & Plan Note (Signed)
His BP is well controlled Today I will check his lytes and renal function 

## 2013-11-12 NOTE — Progress Notes (Signed)
Pre visit review using our clinic review tool, if applicable. No additional management support is needed unless otherwise documented below in the visit note. 

## 2013-11-12 NOTE — Assessment & Plan Note (Signed)
He has seen Dr. Krista Blue about this and is now developing more symptoms so I have asked him to f/up with her

## 2013-11-12 NOTE — Assessment & Plan Note (Signed)
He has persistent diarrhea and wants to see GI again

## 2013-11-12 NOTE — Patient Instructions (Signed)
Type 2 Diabetes Mellitus, Adult Type 2 diabetes mellitus, often simply referred to as type 2 diabetes, is a long-lasting (chronic) disease. In type 2 diabetes, the pancreas does not make enough insulin (a hormone), the cells are less responsive to the insulin that is made (insulin resistance), or both. Normally, insulin moves sugars from food into the tissue cells. The tissue cells use the sugars for energy. The lack of insulin or the lack of normal response to insulin causes excess sugars to build up in the blood instead of going into the tissue cells. As a result, high blood sugar (hyperglycemia) develops. The effect of high sugar (glucose) levels can cause many complications. Type 2 diabetes was also previously called adult-onset diabetes but it can occur at any age.  RISK FACTORS  A person is predisposed to developing type 2 diabetes if someone in the family has the disease and also has one or more of the following primary risk factors:  Overweight.  An inactive lifestyle.  A history of consistently eating high-calorie foods. Maintaining a normal weight and regular physical activity can reduce the chance of developing type 2 diabetes. SYMPTOMS  A person with type 2 diabetes may not show symptoms initially. The symptoms of type 2 diabetes appear slowly. The symptoms include:  Increased thirst (polydipsia).  Increased urination (polyuria).  Increased urination during the night (nocturia).  Weight loss. This weight loss may be rapid.  Frequent, recurring infections.  Tiredness (fatigue).  Weakness.  Vision changes, such as blurred vision.  Fruity smell to your breath.  Abdominal pain.  Nausea or vomiting.  Cuts or bruises which are slow to heal.  Tingling or numbness in the hands or feet. DIAGNOSIS Type 2 diabetes is frequently not diagnosed until complications of diabetes are present. Type 2 diabetes is diagnosed when symptoms or complications are present and when blood  glucose levels are increased. Your blood glucose level may be checked by one or more of the following blood tests:  A fasting blood glucose test. You will not be allowed to eat for at least 8 hours before a blood sample is taken.  A random blood glucose test. Your blood glucose is checked at any time of the day regardless of when you ate.  A hemoglobin A1c blood glucose test. A hemoglobin A1c test provides information about blood glucose control over the previous 3 months.  An oral glucose tolerance test (OGTT). Your blood glucose is measured after you have not eaten (fasted) for 2 hours and then after you drink a glucose-containing beverage. TREATMENT   You may need to take insulin or diabetes medicine daily to keep blood glucose levels in the desired range.  You will need to match insulin dosing with exercise and healthy food choices. The treatment goal is to maintain the before meal blood sugar (preprandial glucose) level at 70 130 mg/dL. HOME CARE INSTRUCTIONS   Have your hemoglobin A1c level checked twice a year.  Perform daily blood glucose monitoring as directed by your caregiver.  Monitor urine ketones when you are ill and as directed by your caregiver.  Take your diabetes medicine or insulin as directed by your caregiver to maintain your blood glucose levels in the desired range.  Never run out of diabetes medicine or insulin. It is needed every day.  Adjust insulin based on your intake of carbohydrates. Carbohydrates can raise blood glucose levels but need to be included in your diet. Carbohydrates provide vitamins, minerals, and fiber which are an essential part of   a healthy diet. Carbohydrates are found in fruits, vegetables, whole grains, dairy products, legumes, and foods containing added sugars.    Eat healthy foods. Alternate 3 meals with 3 snacks.  Lose weight if overweight.  Carry a medical alert card or wear your medical alert jewelry.  Carry a 15 gram  carbohydrate snack with you at all times to treat low blood glucose (hypoglycemia). Some examples of 15 gram carbohydrate snacks include:  Glucose tablets, 3 or 4   Glucose gel, 15 gram tube  Raisins, 2 tablespoons (24 grams)  Jelly beans, 6  Animal crackers, 8  Regular pop, 4 ounces (120 mL)  Gummy treats, 9  Recognize hypoglycemia. Hypoglycemia occurs with blood glucose levels of 70 mg/dL and below. The risk for hypoglycemia increases when fasting or skipping meals, during or after intense exercise, and during sleep. Hypoglycemia symptoms can include:  Tremors or shakes.  Decreased ability to concentrate.  Sweating.  Increased heart rate.  Headache.  Dry mouth.  Hunger.  Irritability.  Anxiety.  Restless sleep.  Altered speech or coordination.  Confusion.  Treat hypoglycemia promptly. If you are alert and able to safely swallow, follow the 15:15 rule:  Take 15 20 grams of rapid-acting glucose or carbohydrate. Rapid-acting options include glucose gel, glucose tablets, or 4 ounces (120 mL) of fruit juice, regular soda, or low fat milk.  Check your blood glucose level 15 minutes after taking the glucose.  Take 15 20 grams more of glucose if the repeat blood glucose level is still 70 mg/dL or below.  Eat a meal or snack within 1 hour once blood glucose levels return to normal.    Be alert to polyuria and polydipsia which are early signs of hyperglycemia. An early awareness of hyperglycemia allows for prompt treatment. Treat hyperglycemia as directed by your caregiver.  Engage in at least 150 minutes of moderate-intensity physical activity a week, spread over at least 3 days of the week or as directed by your caregiver. In addition, you should engage in resistance exercise at least 2 times a week or as directed by your caregiver.  Adjust your medicine and food intake as needed if you start a new exercise or sport.  Follow your sick day plan at any time you  are unable to eat or drink as usual.  Avoid tobacco use.  Limit alcohol intake to no more than 1 drink per day for nonpregnant women and 2 drinks per day for men. You should drink alcohol only when you are also eating food. Talk with your caregiver whether alcohol is safe for you. Tell your caregiver if you drink alcohol several times a week.  Follow up with your caregiver regularly.  Schedule an eye exam soon after the diagnosis of type 2 diabetes and then annually.  Perform daily skin and foot care. Examine your skin and feet daily for cuts, bruises, redness, nail problems, bleeding, blisters, or sores. A foot exam by a caregiver should be done annually.  Brush your teeth and gums at least twice a day and floss at least once a day. Follow up with your dentist regularly.  Share your diabetes management plan with your workplace or school.  Stay up-to-date with immunizations.  Learn to manage stress.  Obtain ongoing diabetes education and support as needed.  Participate in, or seek rehabilitation as needed to maintain or improve independence and quality of life. Request a physical or occupational therapy referral if you are having foot or hand numbness or difficulties with grooming,   dressing, eating, or physical activity. SEEK MEDICAL CARE IF:   You are unable to eat food or drink fluids for more than 6 hours.  You have nausea and vomiting for more than 6 hours.  Your blood glucose level is over 240 mg/dL.  There is a change in mental status.  You develop an additional serious illness.  You have diarrhea for more than 6 hours.  You have been sick or have had a fever for a couple of days and are not getting better.  You have pain during any physical activity.  SEEK IMMEDIATE MEDICAL CARE IF:  You have difficulty breathing.  You have moderate to large ketone levels. MAKE SURE YOU:  Understand these instructions.  Will watch your condition.  Will get help right away if  you are not doing well or get worse. Document Released: 09/20/2005 Document Revised: 06/14/2012 Document Reviewed: 04/18/2012 [employer] Patient Information 2014 [employer].  

## 2013-11-12 NOTE — Progress Notes (Signed)
Subjective:    Patient ID: Charles Parker, male    DOB: October 22, 1945, 68 y.o.   MRN: [id number]  Diabetes He presents for his follow-up diabetic visit. He has type 2 diabetes mellitus. His disease course has been stable. Hypoglycemia symptoms include confusion. Pertinent negatives for diabetes include no blurred vision, no chest pain, no fatigue, no foot paresthesias, no foot ulcerations, no polydipsia, no polyphagia, no polyuria, no visual change, no weakness and no weight loss. There are no hypoglycemic complications. There are no diabetic complications. Current diabetic treatment includes oral agent (dual therapy). He is compliant with treatment some of the time. His weight is increasing steadily. He is following a generally unhealthy diet. When asked about meal planning, he reported none. He has not had a previous visit with a dietician. He never participates in exercise. There is no change in his home blood glucose trend. An ACE inhibitor/angiotensin II receptor blocker is being taken. He does not see a podiatrist.Eye exam is current.      Review of Systems  Constitutional: Positive for unexpected weight change (wt gain). Negative for fever, chills, weight loss, diaphoresis, activity change, appetite change and fatigue.  HENT: Negative.   Eyes: Negative.  Negative for blurred vision.  Respiratory: Negative.  Negative for cough, choking, chest tightness, shortness of breath, wheezing and stridor.   Cardiovascular: Negative.  Negative for chest pain, palpitations and leg swelling.  Gastrointestinal: Positive for diarrhea. Negative for nausea, vomiting, abdominal pain, constipation, blood in stool, abdominal distention, anal bleeding and rectal pain.  Endocrine: Negative.  Negative for polydipsia, polyphagia and polyuria.  Genitourinary: Negative.        He has developed urinary incontinence  Musculoskeletal: Negative.   Skin: Negative.   Allergic/Immunologic: Negative.   Neurological:  Negative.  Negative for weakness.  Hematological: Negative.  Negative for adenopathy. Does not bruise/bleed easily.  Psychiatric/Behavioral: Positive for confusion and decreased concentration. Negative for behavioral problems, dysphoric mood and agitation. The patient is not hyperactive.        Objective:   Physical Exam  Vitals reviewed. Constitutional: He is oriented to person, place, and time. He appears well-developed and well-nourished. No distress.  HENT:  Head: Normocephalic and atraumatic.  Mouth/Throat: Oropharynx is clear and moist. No oropharyngeal exudate.  Eyes: Conjunctivae are normal. Right eye exhibits no discharge. Left eye exhibits no discharge. No scleral icterus.  Neck: Normal range of motion. Neck supple. No JVD present. No tracheal deviation present. No thyromegaly present.  Cardiovascular: Normal rate, regular rhythm, normal heart sounds and intact distal pulses.  Exam reveals no gallop and no friction rub.   No murmur heard. Pulmonary/Chest: Effort normal and breath sounds normal. No stridor. No respiratory distress. He has no wheezes. He has no rales. He exhibits no tenderness.  Abdominal: Soft. Bowel sounds are normal. He exhibits no distension and no mass. There is no tenderness. There is no rebound and no guarding.  Musculoskeletal: Normal range of motion. He exhibits no tenderness.  Lymphadenopathy:    He has no cervical adenopathy.  Neurological: He is alert and oriented to person, place, and time. He has normal reflexes. He displays normal reflexes. No cranial nerve deficit. He exhibits normal muscle tone. Coordination normal.  Skin: Skin is warm and dry. No rash noted. He is not diaphoretic. No erythema. No pallor.  Psychiatric: He has a normal mood and affect. His speech is normal. Judgment and thought content normal. He is slowed. Cognition and memory are normal. He is inattentive.  Lab Results  Component Value Date   WBC 4.0* 09/24/2013   HGB 13.3  09/24/2013   HCT 39.8 09/24/2013   PLT 187.0 09/24/2013   GLUCOSE 86 06/20/2013   CHOL 186 06/20/2013   TRIG 144.0 06/20/2013   HDL 44.70 06/20/2013   LDLCALC 113* 06/20/2013   ALT 22 06/20/2013   AST 29 06/20/2013   NA 140 06/20/2013   K 3.7 06/20/2013   CL 109 06/20/2013   CREATININE 0.9 06/20/2013   BUN 12 06/20/2013   CO2 26 06/20/2013   TSH 0.95 06/20/2013   PSA 2.40 06/20/2013   INR 2.1* 05/26/2009   HGBA1C 6.4 06/20/2013   MICROALBUR 2.5* 06/20/2013       Assessment & Plan:

## 2013-11-12 NOTE — Assessment & Plan Note (Signed)
He complains of persistent diarrhea so will stop the metformin I have asked him to be more complaint with welchol as this will lower the BS and will help with the diarrhea For additional BS control will add Januvia Today I will check his A1C and will monitor his renal function

## 2013-11-15 ENCOUNTER — Telehealth: Payer: Self-pay

## 2013-11-15 NOTE — Telephone Encounter (Signed)
Relevant patient education mailed to patient.  

## 2013-12-05 ENCOUNTER — Encounter: Payer: Self-pay | Admitting: Internal Medicine

## 2013-12-12 ENCOUNTER — Ambulatory Visit: Payer: Medicare Other | Admitting: Internal Medicine

## 2013-12-20 ENCOUNTER — Ambulatory Visit (INDEPENDENT_AMBULATORY_CARE_PROVIDER_SITE_OTHER): Payer: Medicare Other | Admitting: Internal Medicine

## 2013-12-20 ENCOUNTER — Encounter: Payer: Self-pay | Admitting: Internal Medicine

## 2013-12-20 ENCOUNTER — Other Ambulatory Visit (INDEPENDENT_AMBULATORY_CARE_PROVIDER_SITE_OTHER): Payer: Medicare Other

## 2013-12-20 VITALS — BP 110/68 | HR 91 | Temp 98.8°F | Resp 16 | Ht 72.0 in | Wt 190.2 lb

## 2013-12-20 DIAGNOSIS — I10 Essential (primary) hypertension: Secondary | ICD-10-CM

## 2013-12-20 DIAGNOSIS — E1165 Type 2 diabetes mellitus with hyperglycemia: Secondary | ICD-10-CM

## 2013-12-20 DIAGNOSIS — E1139 Type 2 diabetes mellitus with other diabetic ophthalmic complication: Secondary | ICD-10-CM

## 2013-12-20 DIAGNOSIS — R10814 Left lower quadrant abdominal tenderness: Secondary | ICD-10-CM | POA: Insufficient documentation

## 2013-12-20 DIAGNOSIS — G319 Degenerative disease of nervous system, unspecified: Secondary | ICD-10-CM

## 2013-12-20 LAB — BASIC METABOLIC PANEL
BUN: 18 mg/dL (ref 6–23)
CO2: 28 meq/L (ref 19–32)
Calcium: 9.3 mg/dL (ref 8.4–10.5)
Chloride: 104 mEq/L (ref 96–112)
Creatinine, Ser: 1.1 mg/dL (ref 0.4–1.5)
GFR: 89.4 mL/min (ref >60.00)
GLUCOSE: 67 mg/dL — AB (ref 70–99)
POTASSIUM: 4 meq/L (ref 3.5–5.1)
Sodium: 141 mEq/L (ref 135–145)

## 2013-12-20 LAB — HEMOGLOBIN A1C: HEMOGLOBIN A1C: 6.5 % (ref 4.6–6.5)

## 2013-12-20 NOTE — Patient Instructions (Signed)
Type 2 Diabetes Mellitus, Adult Type 2 diabetes mellitus, often simply referred to as type 2 diabetes, is a long-lasting (chronic) disease. In type 2 diabetes, the pancreas does not make enough insulin (a hormone), the cells are less responsive to the insulin that is made (insulin resistance), or both. Normally, insulin moves sugars from food into the tissue cells. The tissue cells use the sugars for energy. The lack of insulin or the lack of normal response to insulin causes excess sugars to build up in the blood instead of going into the tissue cells. As a result, high blood sugar (hyperglycemia) develops. The effect of high sugar (glucose) levels can cause many complications. Type 2 diabetes was also previously called adult-onset diabetes but it can occur at any age.  RISK FACTORS  A person is predisposed to developing type 2 diabetes if someone in the family has the disease and also has one or more of the following primary risk factors:  Overweight.  An inactive lifestyle.  A history of consistently eating high-calorie foods. Maintaining a normal weight and regular physical activity can reduce the chance of developing type 2 diabetes. SYMPTOMS  A person with type 2 diabetes may not show symptoms initially. The symptoms of type 2 diabetes appear slowly. The symptoms include:  Increased thirst (polydipsia).  Increased urination (polyuria).  Increased urination during the night (nocturia).  Weight loss. This weight loss may be rapid.  Frequent, recurring infections.  Tiredness (fatigue).  Weakness.  Vision changes, such as blurred vision.  Fruity smell to your breath.  Abdominal pain.  Nausea or vomiting.  Cuts or bruises which are slow to heal.  Tingling or numbness in the hands or feet. DIAGNOSIS Type 2 diabetes is frequently not diagnosed until complications of diabetes are present. Type 2 diabetes is diagnosed when symptoms or complications are present and when blood  glucose levels are increased. Your blood glucose level may be checked by one or more of the following blood tests:  A fasting blood glucose test. You will not be allowed to eat for at least 8 hours before a blood sample is taken.  A random blood glucose test. Your blood glucose is checked at any time of the day regardless of when you ate.  A hemoglobin A1c blood glucose test. A hemoglobin A1c test provides information about blood glucose control over the previous 3 months.  An oral glucose tolerance test (OGTT). Your blood glucose is measured after you have not eaten (fasted) for 2 hours and then after you drink a glucose-containing beverage. TREATMENT   You may need to take insulin or diabetes medicine daily to keep blood glucose levels in the desired range.  You will need to match insulin dosing with exercise and healthy food choices. The treatment goal is to maintain the before meal blood sugar (preprandial glucose) level at 70 130 mg/dL. HOME CARE INSTRUCTIONS   Have your hemoglobin A1c level checked twice a year.  Perform daily blood glucose monitoring as directed by your caregiver.  Monitor urine ketones when you are ill and as directed by your caregiver.  Take your diabetes medicine or insulin as directed by your caregiver to maintain your blood glucose levels in the desired range.  Never run out of diabetes medicine or insulin. It is needed every day.  Adjust insulin based on your intake of carbohydrates. Carbohydrates can raise blood glucose levels but need to be included in your diet. Carbohydrates provide vitamins, minerals, and fiber which are an essential part of   a healthy diet. Carbohydrates are found in fruits, vegetables, whole grains, dairy products, legumes, and foods containing added sugars.    Eat healthy foods. Alternate 3 meals with 3 snacks.  Lose weight if overweight.  Carry a medical alert card or wear your medical alert jewelry.  Carry a 15 gram  carbohydrate snack with you at all times to treat low blood glucose (hypoglycemia). Some examples of 15 gram carbohydrate snacks include:  Glucose tablets, 3 or 4   Glucose gel, 15 gram tube  Raisins, 2 tablespoons (24 grams)  Jelly beans, 6  Animal crackers, 8  Regular pop, 4 ounces (120 mL)  Gummy treats, 9  Recognize hypoglycemia. Hypoglycemia occurs with blood glucose levels of 70 mg/dL and below. The risk for hypoglycemia increases when fasting or skipping meals, during or after intense exercise, and during sleep. Hypoglycemia symptoms can include:  Tremors or shakes.  Decreased ability to concentrate.  Sweating.  Increased heart rate.  Headache.  Dry mouth.  Hunger.  Irritability.  Anxiety.  Restless sleep.  Altered speech or coordination.  Confusion.  Treat hypoglycemia promptly. If you are alert and able to safely swallow, follow the 15:15 rule:  Take 15 20 grams of rapid-acting glucose or carbohydrate. Rapid-acting options include glucose gel, glucose tablets, or 4 ounces (120 mL) of fruit juice, regular soda, or low fat milk.  Check your blood glucose level 15 minutes after taking the glucose.  Take 15 20 grams more of glucose if the repeat blood glucose level is still 70 mg/dL or below.  Eat a meal or snack within 1 hour once blood glucose levels return to normal.    Be alert to polyuria and polydipsia which are early signs of hyperglycemia. An early awareness of hyperglycemia allows for prompt treatment. Treat hyperglycemia as directed by your caregiver.  Engage in at least 150 minutes of moderate-intensity physical activity a week, spread over at least 3 days of the week or as directed by your caregiver. In addition, you should engage in resistance exercise at least 2 times a week or as directed by your caregiver.  Adjust your medicine and food intake as needed if you start a new exercise or sport.  Follow your sick day plan at any time you  are unable to eat or drink as usual.  Avoid tobacco use.  Limit alcohol intake to no more than 1 drink per day for nonpregnant women and 2 drinks per day for men. You should drink alcohol only when you are also eating food. Talk with your caregiver whether alcohol is safe for you. Tell your caregiver if you drink alcohol several times a week.  Follow up with your caregiver regularly.  Schedule an eye exam soon after the diagnosis of type 2 diabetes and then annually.  Perform daily skin and foot care. Examine your skin and feet daily for cuts, bruises, redness, nail problems, bleeding, blisters, or sores. A foot exam by a caregiver should be done annually.  Brush your teeth and gums at least twice a day and floss at least once a day. Follow up with your dentist regularly.  Share your diabetes management plan with your workplace or school.  Stay up-to-date with immunizations.  Learn to manage stress.  Obtain ongoing diabetes education and support as needed.  Participate in, or seek rehabilitation as needed to maintain or improve independence and quality of life. Request a physical or occupational therapy referral if you are having foot or hand numbness or difficulties with grooming,   dressing, eating, or physical activity. SEEK MEDICAL CARE IF:   You are unable to eat food or drink fluids for more than 6 hours.  You have nausea and vomiting for more than 6 hours.  Your blood glucose level is over 240 mg/dL.  There is a change in mental status.  You develop an additional serious illness.  You have diarrhea for more than 6 hours.  You have been sick or have had a fever for a couple of days and are not getting better.  You have pain during any physical activity.  SEEK IMMEDIATE MEDICAL CARE IF:  You have difficulty breathing.  You have moderate to large ketone levels. MAKE SURE YOU:  Understand these instructions.  Will watch your condition.  Will get help right away if  you are not doing well or get worse. Document Released: 09/20/2005 Document Revised: 06/14/2012 Document Reviewed: 04/18/2012 [employer] Patient Information 2014 [employer].  

## 2013-12-20 NOTE — Progress Notes (Signed)
Pre visit review using our clinic review tool, if applicable. No additional management support is needed unless otherwise documented below in the visit note. 

## 2013-12-20 NOTE — Progress Notes (Signed)
Subjective:    Patient ID: Charles Parker, male    DOB: 1946-02-12, 68 y.o.   MRN: [id number]  Abdominal Pain This is a recurrent problem. The current episode started more than 1 year ago. The onset quality is gradual. The problem occurs intermittently. The problem has been unchanged. The pain is located in the LLQ. The pain is at a severity of 1/10. The pain is mild. The quality of the pain is aching. The abdominal pain does not radiate. Associated symptoms include diarrhea. Pertinent negatives include no anorexia, arthralgias, belching, constipation, dysuria, fever, flatus, frequency, headaches, hematochezia, hematuria, melena, myalgias, nausea, vomiting or weight loss. The pain is relieved by nothing. He has tried nothing for the symptoms. The treatment provided no relief. His past medical history is significant for abdominal surgery. There is no history of colon cancer, Crohn's disease, gallstones, GERD, irritable bowel syndrome, pancreatitis, PUD or ulcerative colitis.      Review of Systems  Constitutional: Negative.  Negative for fever, chills, weight loss, diaphoresis, appetite change and fatigue.  HENT: Negative.   Eyes: Negative.   Respiratory: Negative.  Negative for cough, choking, chest tightness, shortness of breath and stridor.   Cardiovascular: Negative.  Negative for chest pain, palpitations and leg swelling.  Gastrointestinal: Positive for abdominal pain and diarrhea. Negative for nausea, vomiting, constipation, blood in stool, melena, hematochezia, abdominal distention, anal bleeding, anorexia and flatus.  Endocrine: Negative.   Genitourinary: Negative for dysuria, frequency, hematuria, flank pain and difficulty urinating.  Musculoskeletal: Negative.  Negative for arthralgias, joint swelling, myalgias and neck stiffness.  Skin: Negative.   Allergic/Immunologic: Negative.   Neurological: Negative.  Negative for dizziness and headaches.  Hematological: Negative.  Negative  for adenopathy. Does not bruise/bleed easily.  Psychiatric/Behavioral: Positive for confusion and decreased concentration. Negative for suicidal ideas, hallucinations, behavioral problems, sleep disturbance, self-injury, dysphoric mood and agitation. The patient is not nervous/anxious and is not hyperactive.        Objective:   Physical Exam  Vitals reviewed. Constitutional: He is oriented to person, place, and time. He appears well-developed and well-nourished. No distress.  HENT:  Head: Normocephalic and atraumatic.  Mouth/Throat: Oropharynx is clear and moist. No oropharyngeal exudate.  Eyes: Conjunctivae are normal. Right eye exhibits no discharge. Left eye exhibits no discharge. No scleral icterus.  Neck: Normal range of motion. Neck supple. No JVD present. No tracheal deviation present. No thyromegaly present.  Cardiovascular: Normal rate, regular rhythm and intact distal pulses.  Exam reveals no gallop and no friction rub.   No murmur heard. Pulmonary/Chest: Effort normal and breath sounds normal. No stridor. No respiratory distress. He has no wheezes. He has no rales. He exhibits no tenderness.  Abdominal: Normal appearance and bowel sounds are normal. He exhibits no shifting dullness, no distension, no pulsatile liver, no fluid wave, no abdominal bruit, no ascites, no pulsatile midline mass and no mass. There is no hepatosplenomegaly, splenomegaly or hepatomegaly. There is tenderness in the left lower quadrant. There is no rigidity, no rebound, no guarding, no CVA tenderness, no tenderness at McBurney's point and negative Murphy's sign. No hernia. Hernia confirmed negative in the ventral area, confirmed negative in the right inguinal area and confirmed negative in the left inguinal area.  Musculoskeletal: Normal range of motion. He exhibits no edema and no tenderness.  Lymphadenopathy:    He has no cervical adenopathy.  Neurological: He is oriented to person, place, and time.  Skin:  Skin is warm and dry. No rash noted. He  is not diaphoretic. No erythema. No pallor.  Psychiatric: He has a normal mood and affect. His behavior is normal. Judgment and thought content normal.     Lab Results  Component Value Date   WBC 4.0* 09/24/2013   HGB 13.3 09/24/2013   HCT 39.8 09/24/2013   PLT 187.0 09/24/2013   GLUCOSE 86 06/20/2013   CHOL 186 06/20/2013   TRIG 144.0 06/20/2013   HDL 44.70 06/20/2013   LDLCALC 113* 06/20/2013   ALT 22 06/20/2013   AST 29 06/20/2013   NA 140 06/20/2013   K 3.7 06/20/2013   CL 109 06/20/2013   CREATININE 0.9 06/20/2013   BUN 12 06/20/2013   CO2 26 06/20/2013   TSH 0.95 06/20/2013   PSA 2.40 06/20/2013   INR 2.1* 05/26/2009   HGBA1C 6.4 06/20/2013   MICROALBUR 2.5* 06/20/2013       Assessment & Plan:

## 2013-12-21 ENCOUNTER — Encounter: Payer: Self-pay | Admitting: Internal Medicine

## 2013-12-21 NOTE — Assessment & Plan Note (Signed)
I will check a CT scan to see if there are any complications from his prior surgery

## 2013-12-21 NOTE — Assessment & Plan Note (Signed)
His blood sugars are well controlled 

## 2013-12-21 NOTE — Assessment & Plan Note (Signed)
He and his wife remain concerned about his memory so I have asked him to see neurology

## 2013-12-26 ENCOUNTER — Ambulatory Visit (INDEPENDENT_AMBULATORY_CARE_PROVIDER_SITE_OTHER)
Admission: RE | Admit: 2013-12-26 | Discharge: 2013-12-26 | Disposition: A | Discharge status: Home / Self Care | Payer: Medicare Other | Source: Ambulatory Visit | Attending: Internal Medicine | Admitting: Internal Medicine

## 2013-12-26 ENCOUNTER — Encounter: Payer: Self-pay | Admitting: Internal Medicine

## 2013-12-26 DIAGNOSIS — R10814 Left lower quadrant abdominal tenderness: Secondary | ICD-10-CM

## 2013-12-26 MED ORDER — IOHEXOL 300 MG/ML  SOLN
100.0000 mL | Freq: Once | INTRAMUSCULAR | Status: AC | PRN
  Administered 2013-12-26: 100 mL via INTRAVENOUS

## 2014-01-11 ENCOUNTER — Ambulatory Visit (INDEPENDENT_AMBULATORY_CARE_PROVIDER_SITE_OTHER): Payer: Medicare Other | Admitting: Internal Medicine

## 2014-01-11 ENCOUNTER — Encounter: Payer: Self-pay | Admitting: Internal Medicine

## 2014-01-11 VITALS — BP 102/64 | HR 81 | Temp 97.1°F | Resp 16 | Ht 72.0 in | Wt 188.8 lb

## 2014-01-11 DIAGNOSIS — R3916 Straining to void: Secondary | ICD-10-CM

## 2014-01-11 DIAGNOSIS — E1165 Type 2 diabetes mellitus with hyperglycemia: Principal | ICD-10-CM

## 2014-01-11 DIAGNOSIS — N138 Other obstructive and reflux uropathy: Secondary | ICD-10-CM

## 2014-01-11 DIAGNOSIS — I10 Essential (primary) hypertension: Secondary | ICD-10-CM

## 2014-01-11 DIAGNOSIS — E1139 Type 2 diabetes mellitus with other diabetic ophthalmic complication: Secondary | ICD-10-CM

## 2014-01-11 DIAGNOSIS — N401 Enlarged prostate with lower urinary tract symptoms: Secondary | ICD-10-CM

## 2014-01-11 LAB — GLUCOSE, POCT (MANUAL RESULT ENTRY): POC Glucose: 142 mg/dl — AB (ref 70–99)

## 2014-01-11 MED ORDER — TADALAFIL 5 MG PO TABS: 5.0000 mg | ORAL_TABLET | Freq: Every day | ORAL | Status: DC | PRN

## 2014-01-11 MED ORDER — DUTASTERIDE 0.5 MG PO CAPS: 0.5000 mg | ORAL_CAPSULE | Freq: Every day | ORAL | Status: DC

## 2014-01-11 NOTE — Progress Notes (Signed)
Pre visit review using our clinic review tool, if applicable. No additional management support is needed unless otherwise documented below in the visit note. 

## 2014-01-11 NOTE — Patient Instructions (Signed)
Benign Prostatic Hyperplasia An enlarged prostate (benign prostatic hyperplasia) is common in older men. You may experience the following:  Weak urine stream.  Dribbling.  Feeling like the bladder has not emptied completely.  Difficulty starting urination.  Getting up frequently at night to urinate.  Urinating more frequently during the day. HOME CARE INSTRUCTIONS  Monitor your prostatic hyperplasia for any changes. The following actions may help to alleviate any discomfort you are experiencing:  Give yourself time when you urinate.  Stay away from alcohol.  Avoid beverages containing caffeine, such as coffee, tea, and colas, because they can make the problem worse.  Avoid decongestants, antihistamines, and some prescription medicines that can make the problem worse.  Follow up with your health care provider for further treatment as recommended. SEEK MEDICAL CARE IF:  You are experiencing progressive difficulty voiding.  Your urine stream is progressively getting narrower.  You are awaking from sleep with the urge to void more frequently.  You are constantly feeling the need to void.  You experience loss of urine, especially in small amounts. SEEK IMMEDIATE MEDICAL CARE IF:   You develop increased pain with urination or are unable to urinate.  You develop severe abdominal pain, vomiting, a high fever, or fainting.  You develop back pain or blood in your urine. MAKE SURE YOU:   Understand these instructions.  Will watch your condition.  Will get help right away if you are not doing well or get worse. Document Released: 09/20/2005 Document Revised: 05/23/2013 Document Reviewed: 02/20/2013 [employer] Patient Information 2014 [employer].  

## 2014-01-13 ENCOUNTER — Encounter: Payer: Self-pay | Admitting: Internal Medicine

## 2014-01-13 NOTE — Assessment & Plan Note (Signed)
Will treat with avodart and cialis

## 2014-01-13 NOTE — Assessment & Plan Note (Signed)
His BP is well controlled 

## 2014-01-13 NOTE — Progress Notes (Signed)
   Subjective:    Patient ID: Charles Parker, male    DOB: [birth year], 68 y.o.   MRN: [id number]  Benign Prostatic Hypertrophy This is a chronic problem. The current episode started more than 1 year ago. The problem has been gradually worsening since onset. Irritative symptoms include frequency. Irritative symptoms do not include urgency. Obstructive symptoms include dribbling, incomplete emptying, an intermittent stream, a slower stream, straining and a weak stream. Pertinent negatives include no chills, dysuria, genital pain, hematuria, hesitancy, nausea or vomiting. AUA score is 8-19. He is sexually active. Nothing aggravates the symptoms. Past treatments include nothing.      Review of Systems  Constitutional: Negative.  Negative for fever, chills, diaphoresis, appetite change and fatigue.  HENT: Negative.   Eyes: Negative.   Respiratory: Negative.  Negative for cough, choking, chest tightness, shortness of breath and stridor.   Cardiovascular: Negative.  Negative for chest pain, palpitations and leg swelling.  Gastrointestinal: Negative.  Negative for nausea, vomiting, abdominal pain, diarrhea, constipation and blood in stool.  Endocrine: Negative.   Genitourinary: Positive for frequency and incomplete emptying. Negative for dysuria, hesitancy, urgency, hematuria, flank pain, decreased urine volume, discharge, penile swelling, scrotal swelling, enuresis, difficulty urinating, genital sores, penile pain and testicular pain.  Musculoskeletal: Negative.   Skin: Negative.   Allergic/Immunologic: Negative.   Neurological: Negative.   Hematological: Negative for adenopathy. Does not bruise/bleed easily.  Psychiatric/Behavioral: Negative.        Objective:   Physical Exam  Vitals reviewed. Constitutional: He is oriented to person, place, and time. He appears well-developed and well-nourished. No distress.  HENT:  Head: Normocephalic and atraumatic.  Mouth/Throat: Oropharynx is clear  and moist. No oropharyngeal exudate.  Eyes: Conjunctivae are normal. Right eye exhibits no discharge. Left eye exhibits no discharge. No scleral icterus.  Neck: Normal range of motion. Neck supple. No JVD present. No tracheal deviation present. No thyromegaly present.  Cardiovascular: Normal rate, regular rhythm, normal heart sounds and intact distal pulses.  Exam reveals no gallop and no friction rub.   No murmur heard. Pulmonary/Chest: Effort normal and breath sounds normal. No stridor. No respiratory distress. He has no wheezes. He has no rales. He exhibits no tenderness.  Abdominal: Soft. Bowel sounds are normal. He exhibits no distension and no mass. There is no tenderness. There is no rebound and no guarding.  Musculoskeletal: Normal range of motion. He exhibits no edema and no tenderness.  Lymphadenopathy:    He has no cervical adenopathy.  Neurological: He is oriented to person, place, and time.  Skin: Skin is warm and dry. No rash noted. He is not diaphoretic. No erythema. No pallor.  Psychiatric: He has a normal mood and affect. His behavior is normal. Judgment and thought content normal.          Assessment & Plan:

## 2014-01-13 NOTE — Assessment & Plan Note (Signed)
His blood sugars are well controlled 

## 2014-01-24 ENCOUNTER — Other Ambulatory Visit: Payer: Self-pay | Admitting: Internal Medicine

## 2014-05-06 ENCOUNTER — Ambulatory Visit: Payer: Medicare Other | Admitting: Internal Medicine

## 2014-05-06 DIAGNOSIS — Z0289 Encounter for other administrative examinations: Secondary | ICD-10-CM

## 2014-05-08 ENCOUNTER — Telehealth: Payer: Self-pay | Admitting: Internal Medicine

## 2014-05-08 NOTE — Telephone Encounter (Signed)
noted 

## 2014-05-08 NOTE — Telephone Encounter (Signed)
Patient no showed for diabetic fu on 08/3.  Please advise.

## 2014-06-07 ENCOUNTER — Other Ambulatory Visit: Payer: Self-pay | Admitting: Internal Medicine

## 2014-06-27 ENCOUNTER — Other Ambulatory Visit: Payer: Self-pay | Admitting: Internal Medicine

## 2014-07-06 ENCOUNTER — Other Ambulatory Visit: Payer: Self-pay | Admitting: Internal Medicine

## 2014-07-10 ENCOUNTER — Encounter: Payer: Self-pay | Admitting: Internal Medicine

## 2014-07-10 ENCOUNTER — Other Ambulatory Visit: Payer: Self-pay | Admitting: Internal Medicine

## 2014-07-10 ENCOUNTER — Ambulatory Visit (INDEPENDENT_AMBULATORY_CARE_PROVIDER_SITE_OTHER): Payer: Medicare Other | Admitting: Internal Medicine

## 2014-07-10 ENCOUNTER — Other Ambulatory Visit (INDEPENDENT_AMBULATORY_CARE_PROVIDER_SITE_OTHER): Payer: Medicare Other

## 2014-07-10 VITALS — BP 128/72 | HR 80 | Temp 98.1°F | Resp 16 | Ht 72.0 in | Wt 202.0 lb

## 2014-07-10 DIAGNOSIS — E785 Hyperlipidemia, unspecified: Secondary | ICD-10-CM

## 2014-07-10 DIAGNOSIS — N401 Enlarged prostate with lower urinary tract symptoms: Secondary | ICD-10-CM

## 2014-07-10 DIAGNOSIS — E11329 Type 2 diabetes mellitus with mild nonproliferative diabetic retinopathy without macular edema: Secondary | ICD-10-CM

## 2014-07-10 DIAGNOSIS — E113299 Type 2 diabetes mellitus with mild nonproliferative diabetic retinopathy without macular edema, unspecified eye: Secondary | ICD-10-CM

## 2014-07-10 DIAGNOSIS — I82401 Acute embolism and thrombosis of unspecified deep veins of right lower extremity: Secondary | ICD-10-CM

## 2014-07-10 DIAGNOSIS — M7989 Other specified soft tissue disorders: Secondary | ICD-10-CM

## 2014-07-10 DIAGNOSIS — I10 Essential (primary) hypertension: Secondary | ICD-10-CM

## 2014-07-10 DIAGNOSIS — I824Y9 Acute embolism and thrombosis of unspecified deep veins of unspecified proximal lower extremity: Secondary | ICD-10-CM | POA: Insufficient documentation

## 2014-07-10 DIAGNOSIS — Z23 Encounter for immunization: Secondary | ICD-10-CM

## 2014-07-10 DIAGNOSIS — R3916 Straining to void: Secondary | ICD-10-CM

## 2014-07-10 LAB — COMPREHENSIVE METABOLIC PANEL
ALT: 36 U/L (ref 0–53)
AST: 36 U/L (ref 0–37)
Albumin: 3.7 g/dL (ref 3.5–5.2)
Alkaline Phosphatase: 57 U/L (ref 39–117)
BUN: 16 mg/dL (ref 6–23)
CALCIUM: 9.8 mg/dL (ref 8.4–10.5)
CO2: 23 mEq/L (ref 19–32)
Chloride: 104 mEq/L (ref 96–112)
Creatinine, Ser: 1 mg/dL (ref 0.4–1.5)
GFR: 100.07 mL/min (ref >60.00)
Glucose, Bld: 133 mg/dL — ABNORMAL HIGH (ref 70–99)
Potassium: 3.7 mEq/L (ref 3.5–5.1)
Sodium: 139 mEq/L (ref 135–145)
TOTAL PROTEIN: 8.1 g/dL (ref 6.0–8.3)
Total Bilirubin: 0.5 mg/dL (ref 0.2–1.2)

## 2014-07-10 LAB — LIPID PANEL
CHOL/HDL RATIO: 8
Cholesterol: 325 mg/dL — ABNORMAL HIGH (ref 0–200)
HDL: 40.1 mg/dL (ref >39.00)
NONHDL: 284.9
Triglycerides: 315 mg/dL — ABNORMAL HIGH (ref 0.0–149.0)
VLDL: 63 mg/dL — AB (ref 0.0–40.0)

## 2014-07-10 LAB — CBC WITH DIFFERENTIAL/PLATELET
Basophils Absolute: 0 10*3/uL (ref 0.0–0.1)
Basophils Relative: 0.4 % (ref 0.0–3.0)
Eosinophils Absolute: 0 10*3/uL (ref 0.0–0.7)
Eosinophils Relative: 0.9 % (ref 0.0–5.0)
HCT: 41.2 % (ref 39.0–52.0)
Hemoglobin: 13.5 g/dL (ref 13.0–17.0)
Lymphocytes Relative: 40.3 % (ref 12.0–46.0)
Lymphs Abs: 2 10*3/uL (ref 0.7–4.0)
MCHC: 32.7 g/dL (ref 30.0–36.0)
MCV: 94.8 fl (ref 78.0–100.0)
MONO ABS: 0.6 10*3/uL (ref 0.1–1.0)
MONOS PCT: 12.6 % — AB (ref 3.0–12.0)
Neutro Abs: 2.3 10*3/uL (ref 1.4–7.7)
Neutrophils Relative %: 45.8 % (ref 43.0–77.0)
PLATELETS: 197 10*3/uL (ref 150.0–400.0)
RBC: 4.35 Mil/uL (ref 4.22–5.81)
RDW: 13.1 % (ref 11.5–15.5)
WBC: 5 10*3/uL (ref 4.0–10.5)

## 2014-07-10 LAB — TSH: TSH: 1.71 u[IU]/mL (ref 0.35–4.50)

## 2014-07-10 LAB — HEMOGLOBIN A1C: HEMOGLOBIN A1C: 8.7 % — AB (ref 4.6–6.5)

## 2014-07-10 NOTE — Progress Notes (Signed)
Pre visit review using our clinic review tool, if applicable. No additional management support is needed unless otherwise documented below in the visit note. 

## 2014-07-10 NOTE — Progress Notes (Signed)
   Subjective:    Patient ID: Charles Parker, male    DOB: 14-Feb-1946, 68 y.o.   MRN: [id number]  HPI Comments: He complains of a 4 day history of pain and swelling in his right leg. There has nor been any precipitating trauma or injury. No recent events like surgery or travel. He recalls a possible blood clot in his left leg 14 years ago associated with knee surgery.     Review of Systems  Constitutional: Negative.  Negative for fever, chills, diaphoresis, appetite change and fatigue.  HENT: Negative.  Negative for nosebleeds.   Eyes: Negative.   Respiratory: Negative for cough, choking, chest tightness, shortness of breath, wheezing and stridor.   Cardiovascular: Positive for leg swelling. Negative for chest pain and palpitations.  Gastrointestinal: Negative.  Negative for nausea, vomiting, abdominal pain, diarrhea, constipation and blood in stool.  Endocrine: Negative.  Negative for polydipsia, polyphagia and polyuria.  Genitourinary: Negative.  Negative for hematuria.  Musculoskeletal: Negative.  Negative for arthralgias, back pain, joint swelling, myalgias and neck stiffness.  Skin: Negative.  Negative for rash.  Allergic/Immunologic: Negative.   Neurological: Negative.  Negative for dizziness, syncope, speech difficulty, weakness, light-headedness, numbness and headaches.  Hematological: Negative.  Negative for adenopathy. Does not bruise/bleed easily.  Psychiatric/Behavioral: Negative.        Objective:   Physical Exam  Vitals reviewed. Constitutional: He is oriented to person, place, and time. He appears well-developed and well-nourished. No distress.  HENT:  Head: Normocephalic and atraumatic.  Mouth/Throat: Oropharynx is clear and moist. No oropharyngeal exudate.  Eyes: Conjunctivae are normal. Right eye exhibits no discharge. Left eye exhibits no discharge. No scleral icterus.  Neck: Normal range of motion. Neck supple. No JVD present. No tracheal deviation present. No  thyromegaly present.  Cardiovascular: Normal rate, regular rhythm, normal heart sounds and intact distal pulses.  Exam reveals no gallop and no friction rub.   No murmur heard. Pulmonary/Chest: Effort normal and breath sounds normal. No stridor. No respiratory distress. He has no wheezes. He has no rales. He exhibits no tenderness.  Abdominal: Soft. Bowel sounds are normal. He exhibits no distension and no mass. There is no tenderness. There is no rebound and no guarding.  Musculoskeletal: Normal range of motion. He exhibits edema (2+ pitting edema in RLE, no edema in LLE). He exhibits no tenderness.  Lymphadenopathy:    He has no cervical adenopathy.  Neurological: He is oriented to person, place, and time.  Skin: Skin is warm and dry. No rash noted. He is not diaphoretic. No erythema. No pallor.     Lab Results  Component Value Date   WBC 4.0* 09/24/2013   HGB 13.3 09/24/2013   HCT 39.8 09/24/2013   PLT 187.0 09/24/2013   GLUCOSE 67* 12/20/2013   CHOL 186 06/20/2013   TRIG 144.0 06/20/2013   HDL 44.70 06/20/2013   LDLCALC 113* 06/20/2013   ALT 22 06/20/2013   AST 29 06/20/2013   NA 141 12/20/2013   K 4.0 12/20/2013   CL 104 12/20/2013   CREATININE 1.1 12/20/2013   BUN 18 12/20/2013   CO2 28 12/20/2013   TSH 0.95 06/20/2013   PSA 2.40 06/20/2013   INR 2.1* 05/26/2009   HGBA1C 6.5 12/20/2013   MICROALBUR 2.5* 06/20/2013       Assessment & Plan:

## 2014-07-10 NOTE — Patient Instructions (Signed)

## 2014-07-11 ENCOUNTER — Ambulatory Visit (INDEPENDENT_AMBULATORY_CARE_PROVIDER_SITE_OTHER): Payer: Medicare Other | Admitting: Internal Medicine

## 2014-07-11 ENCOUNTER — Telehealth: Payer: Self-pay | Admitting: Internal Medicine

## 2014-07-11 ENCOUNTER — Encounter: Payer: Self-pay | Admitting: Internal Medicine

## 2014-07-11 ENCOUNTER — Telehealth: Payer: Self-pay

## 2014-07-11 ENCOUNTER — Ambulatory Visit (HOSPITAL_COMMUNITY): Payer: Medicare Other | Attending: Internal Medicine | Admitting: Cardiology

## 2014-07-11 VITALS — BP 118/72 | HR 70 | Temp 97.8°F | Resp 16

## 2014-07-11 DIAGNOSIS — I824Y1 Acute embolism and thrombosis of unspecified deep veins of right proximal lower extremity: Secondary | ICD-10-CM

## 2014-07-11 DIAGNOSIS — E785 Hyperlipidemia, unspecified: Secondary | ICD-10-CM | POA: Insufficient documentation

## 2014-07-11 DIAGNOSIS — I10 Essential (primary) hypertension: Secondary | ICD-10-CM | POA: Insufficient documentation

## 2014-07-11 DIAGNOSIS — M79606 Pain in leg, unspecified: Secondary | ICD-10-CM | POA: Insufficient documentation

## 2014-07-11 DIAGNOSIS — M79604 Pain in right leg: Secondary | ICD-10-CM

## 2014-07-11 DIAGNOSIS — I80202 Phlebitis and thrombophlebitis of unspecified deep vessels of left lower extremity: Secondary | ICD-10-CM

## 2014-07-11 DIAGNOSIS — E119 Type 2 diabetes mellitus without complications: Secondary | ICD-10-CM | POA: Insufficient documentation

## 2014-07-11 DIAGNOSIS — M7989 Other specified soft tissue disorders: Secondary | ICD-10-CM

## 2014-07-11 DIAGNOSIS — I80201 Phlebitis and thrombophlebitis of unspecified deep vessels of right lower extremity: Secondary | ICD-10-CM

## 2014-07-11 LAB — LDL CHOLESTEROL, DIRECT: LDL DIRECT: 221.4 mg/dL

## 2014-07-11 LAB — D-DIMER, QUANTITATIVE (NOT AT ARMC): D-Dimer, Quant: 6.92 ug/mL-FEU — ABNORMAL HIGH (ref 0.00–0.48)

## 2014-07-11 MED ORDER — SIMVASTATIN 40 MG PO TABS: 40.0000 mg | ORAL_TABLET | Freq: Every evening | ORAL | Status: DC

## 2014-07-11 MED ORDER — APIXABAN 5 MG PO TABS: 5.0000 mg | ORAL_TABLET | Freq: Two times a day (BID) | ORAL | Status: DC

## 2014-07-11 NOTE — Progress Notes (Signed)
Pre visit review using our clinic review tool, if applicable. No additional management support is needed unless otherwise documented below in the visit note. 

## 2014-07-11 NOTE — Patient Instructions (Signed)

## 2014-07-11 NOTE — Telephone Encounter (Signed)
Vascular center called. Pt is positive for femoral thrombosis at the groin to the ankle.   Made appointment for pt to see you today at 3pm.

## 2014-07-11 NOTE — Telephone Encounter (Signed)
I called him this morning to let him know that there may be a blood clot in his calf, LMOVM, I have eliquis samples for him if he calls back

## 2014-07-11 NOTE — Telephone Encounter (Signed)
emmi mailed  °

## 2014-07-11 NOTE — Assessment & Plan Note (Signed)
His blood sugars are not well controlled I have asked him to restart his usual meds

## 2014-07-11 NOTE — Telephone Encounter (Signed)
Received a critical lab result taken by other that reported a D Dimer of 6.92.

## 2014-07-11 NOTE — Assessment & Plan Note (Signed)
His BP is well controlled 

## 2014-07-11 NOTE — Assessment & Plan Note (Signed)
He will restart zocor

## 2014-07-11 NOTE — Progress Notes (Signed)
   Subjective:    Patient ID: Charles Parker, male    DOB: 06-Oct-1945, 68 y.o.   MRN: [id number]  HPI Comments: He returns to start treatment for a DVT, unprovoked and possibly his second episode of DVT. The pain and swelling in his RLE has not changed. He denies any CP or SOB.     Review of Systems  Constitutional: Negative.  Negative for fever, chills, diaphoresis, appetite change and fatigue.  HENT: Negative.  Negative for nosebleeds.   Eyes: Negative.   Respiratory: Negative.  Negative for cough, choking, chest tightness, shortness of breath, wheezing and stridor.   Cardiovascular: Positive for leg swelling. Negative for chest pain and palpitations.  Gastrointestinal: Negative.  Negative for anal bleeding.  Endocrine: Negative.   Genitourinary: Negative.   Musculoskeletal: Negative.  Negative for arthralgias, back pain, myalgias and neck pain.  Skin: Negative.  Negative for rash.  Allergic/Immunologic: Negative.   Neurological: Negative.  Negative for dizziness, syncope, weakness and light-headedness.  Hematological: Negative.  Negative for adenopathy. Does not bruise/bleed easily.  Psychiatric/Behavioral: Negative.        Objective:   Physical Exam  Vitals reviewed. Constitutional: He is oriented to person, place, and time. He appears well-developed and well-nourished. No distress.  HENT:  Head: Normocephalic and atraumatic.  Mouth/Throat: Oropharynx is clear and moist. No oropharyngeal exudate.  Eyes: Conjunctivae are normal. Right eye exhibits no discharge. Left eye exhibits no discharge. No scleral icterus.  Neck: Normal range of motion. Neck supple. No JVD present. No tracheal deviation present. No thyromegaly present.  Cardiovascular: Normal rate, regular rhythm, normal heart sounds and intact distal pulses.  Exam reveals no gallop and no friction rub.   No murmur heard. Pulmonary/Chest: Effort normal and breath sounds normal. No stridor. No respiratory distress. He  has no wheezes. He has no rales. He exhibits no tenderness.  Abdominal: Soft. Bowel sounds are normal. He exhibits no distension and no mass. There is no tenderness. There is no rebound and no guarding.  Musculoskeletal: Normal range of motion. He exhibits edema (2+ pitting edema in RLE, no edema in LLE). He exhibits no tenderness.  Lymphadenopathy:    He has no cervical adenopathy.  Neurological: He is oriented to person, place, and time.  Skin: Skin is warm and dry. No rash noted. He is not diaphoretic. No erythema. No pallor.     Lab Results  Component Value Date   WBC 5.0 07/10/2014   HGB 13.5 07/10/2014   HCT 41.2 07/10/2014   PLT 197.0 07/10/2014   GLUCOSE 133* 07/10/2014   CHOL 325* 07/10/2014   TRIG 315.0* 07/10/2014   HDL 40.10 07/10/2014   LDLDIRECT 221.4 07/10/2014   LDLCALC 113* 06/20/2013   ALT 36 07/10/2014   AST 36 07/10/2014   NA 139 07/10/2014   K 3.7 07/10/2014   CL 104 07/10/2014   CREATININE 1.0 07/10/2014   BUN 16 07/10/2014   CO2 23 07/10/2014   TSH 1.71 07/10/2014   PSA 2.40 06/20/2013   INR 2.1* 05/26/2009   HGBA1C 8.7* 07/10/2014   MICROALBUR 2.5* 06/20/2013       Assessment & Plan:

## 2014-07-11 NOTE — Assessment & Plan Note (Signed)
Start eliquis

## 2014-07-11 NOTE — Assessment & Plan Note (Signed)
I favor lifelong anticoagulation Eliquis started Will recheck in 2-3 weeks

## 2014-07-11 NOTE — Progress Notes (Signed)
Bilateral lower venous duplex performed   Results given to Dr.Jones, patient is schedules to see physician at 3:00 pm today.

## 2014-07-14 ENCOUNTER — Other Ambulatory Visit: Payer: Self-pay | Admitting: Internal Medicine

## 2014-08-01 ENCOUNTER — Ambulatory Visit (INDEPENDENT_AMBULATORY_CARE_PROVIDER_SITE_OTHER): Payer: Medicare Other | Admitting: Internal Medicine

## 2014-08-01 ENCOUNTER — Encounter: Payer: Self-pay | Admitting: Internal Medicine

## 2014-08-01 VITALS — BP 122/72 | HR 72 | Temp 98.2°F | Resp 16 | Ht 72.0 in | Wt 208.0 lb

## 2014-08-01 DIAGNOSIS — I824Y1 Acute embolism and thrombosis of unspecified deep veins of right proximal lower extremity: Secondary | ICD-10-CM

## 2014-08-01 DIAGNOSIS — I82401 Acute embolism and thrombosis of unspecified deep veins of right lower extremity: Secondary | ICD-10-CM

## 2014-08-01 DIAGNOSIS — E11319 Type 2 diabetes mellitus with unspecified diabetic retinopathy without macular edema: Secondary | ICD-10-CM

## 2014-08-01 MED ORDER — CANAGLIFLOZIN 300 MG PO TABS: 300.0000 mg | ORAL_TABLET | Freq: Every day | ORAL | Status: DC

## 2014-08-01 MED ORDER — RIVAROXABAN 20 MG PO TABS: 20.0000 mg | ORAL_TABLET | Freq: Every day | ORAL | Status: DC

## 2014-08-01 NOTE — Progress Notes (Signed)
Subjective:    Patient ID: Charles Parker, male    DOB: Jan 06, 1946, 68 y.o.   MRN: [id number]  Diabetes He presents for his follow-up diabetic visit. He has type 2 diabetes mellitus. His disease course has been fluctuating. There are no hypoglycemic associated symptoms. Pertinent negatives for hypoglycemia include no dizziness, headaches, seizures or speech difficulty. Associated symptoms include polydipsia and polyphagia. Pertinent negatives for diabetes include no blurred vision, no chest pain, no fatigue, no foot paresthesias, no foot ulcerations, no polyuria, no visual change, no weakness and no weight loss. There are no hypoglycemic complications. Symptoms are stable. Diabetic complications include retinopathy. Current diabetic treatment includes oral agent (dual therapy). He is compliant with treatment most of the time. His weight is stable. He is following a generally healthy diet. Meal planning includes avoidance of concentrated sweets. He has not had a previous visit with a dietician. He participates in exercise intermittently. There is no change in his home blood glucose trend. An ACE inhibitor/angiotensin II receptor blocker is being taken. He does not see a podiatrist.Eye exam is not current.      Review of Systems  Constitutional: Negative.  Negative for fever, chills, weight loss, diaphoresis, appetite change and fatigue.  HENT: Negative.   Eyes: Negative.  Negative for blurred vision.  Respiratory: Negative.  Negative for cough, choking, chest tightness, shortness of breath and stridor.   Cardiovascular: Positive for leg swelling. Negative for chest pain and palpitations (decreased swelling and pain in his RLE).  Gastrointestinal: Negative for nausea, vomiting, abdominal pain, diarrhea, constipation and blood in stool.  Endocrine: Positive for polydipsia and polyphagia. Negative for polyuria.  Genitourinary: Negative.  Negative for urgency, hematuria, decreased urine volume and  difficulty urinating.  Musculoskeletal: Positive for arthralgias. Negative for back pain, gait problem, joint swelling, myalgias, neck pain and neck stiffness.  Allergic/Immunologic: Negative.   Neurological: Negative.  Negative for dizziness, seizures, speech difficulty, weakness, light-headedness, numbness and headaches.  Hematological: Negative.  Negative for adenopathy. Does not bruise/bleed easily.  Psychiatric/Behavioral: Negative.        Objective:   Physical Exam  Vitals reviewed. Constitutional: He is oriented to person, place, and time. He appears well-developed and well-nourished. No distress.  HENT:  Head: Normocephalic and atraumatic.  Mouth/Throat: Oropharynx is clear and moist. No oropharyngeal exudate.  Eyes: Conjunctivae are normal. Right eye exhibits no discharge. Left eye exhibits no discharge. No scleral icterus.  Neck: Normal range of motion. Neck supple. No JVD present. No tracheal deviation present. No thyromegaly present.  Cardiovascular: Normal rate, regular rhythm, normal heart sounds and intact distal pulses.  Exam reveals no gallop and no friction rub.   No murmur heard. Pulses:      Femoral pulses are 1+ on the right side, and 1+ on the left side.      Popliteal pulses are 1+ on the right side, and 1+ on the left side.       Dorsalis pedis pulses are 1+ on the right side, and 1+ on the left side.       Posterior tibial pulses are 1+ on the right side, and 1+ on the left side.  Pulmonary/Chest: Effort normal and breath sounds normal. No stridor. No respiratory distress. He has no wheezes. He has no rales. He exhibits no tenderness.  Abdominal: Soft. Bowel sounds are normal. He exhibits no distension and no mass. There is no tenderness. There is no rebound and no guarding.  Musculoskeletal: Normal range of motion. He exhibits edema (  1+ edema in RLE). He exhibits no tenderness.  Lymphadenopathy:    He has no cervical adenopathy.  Neurological: He is oriented to  person, place, and time.  Skin: Skin is warm and dry. No rash noted. He is not diaphoretic. No erythema. No pallor.  Psychiatric: He has a normal mood and affect. His behavior is normal. Judgment and thought content normal.     Lab Results  Component Value Date   WBC 5.0 07/10/2014   HGB 13.5 07/10/2014   HCT 41.2 07/10/2014   PLT 197.0 07/10/2014   GLUCOSE 133* 07/10/2014   CHOL 325* 07/10/2014   TRIG 315.0* 07/10/2014   HDL 40.10 07/10/2014   LDLDIRECT 221.4 07/10/2014   LDLCALC 113* 06/20/2013   ALT 36 07/10/2014   AST 36 07/10/2014   NA 139 07/10/2014   K 3.7 07/10/2014   CL 104 07/10/2014   CREATININE 1.0 07/10/2014   BUN 16 07/10/2014   CO2 23 07/10/2014   TSH 1.71 07/10/2014   PSA 2.40 06/20/2013   INR 2.1* 05/26/2009   HGBA1C 8.7* 07/10/2014   MICROALBUR 2.5* 06/20/2013       Assessment & Plan:

## 2014-08-01 NOTE — Assessment & Plan Note (Signed)
His blood sugars have not been well controlled I have asked him to add invokana to his current regimen

## 2014-08-01 NOTE — Assessment & Plan Note (Signed)
Some improvements noted His insurance does not cover eliquis Will change to xarelto

## 2014-08-01 NOTE — Patient Instructions (Signed)

## 2014-11-01 ENCOUNTER — Ambulatory Visit (INDEPENDENT_AMBULATORY_CARE_PROVIDER_SITE_OTHER): Payer: Commercial Managed Care - HMO | Admitting: Internal Medicine

## 2014-11-01 ENCOUNTER — Encounter: Payer: Self-pay | Admitting: Internal Medicine

## 2014-11-01 ENCOUNTER — Other Ambulatory Visit (INDEPENDENT_AMBULATORY_CARE_PROVIDER_SITE_OTHER): Payer: Commercial Managed Care - HMO

## 2014-11-01 VITALS — BP 120/86 | HR 87 | Temp 97.7°F | Resp 16 | Ht 72.0 in | Wt 203.0 lb

## 2014-11-01 DIAGNOSIS — N410 Acute prostatitis: Secondary | ICD-10-CM | POA: Diagnosis not present

## 2014-11-01 DIAGNOSIS — Z0000 Encounter for general adult medical examination without abnormal findings: Secondary | ICD-10-CM | POA: Diagnosis not present

## 2014-11-01 DIAGNOSIS — E113599 Type 2 diabetes mellitus with proliferative diabetic retinopathy without macular edema, unspecified eye: Secondary | ICD-10-CM

## 2014-11-01 DIAGNOSIS — I10 Essential (primary) hypertension: Secondary | ICD-10-CM

## 2014-11-01 DIAGNOSIS — R3916 Straining to void: Secondary | ICD-10-CM

## 2014-11-01 DIAGNOSIS — E785 Hyperlipidemia, unspecified: Secondary | ICD-10-CM

## 2014-11-01 DIAGNOSIS — E11359 Type 2 diabetes mellitus with proliferative diabetic retinopathy without macular edema: Secondary | ICD-10-CM

## 2014-11-01 DIAGNOSIS — M159 Polyosteoarthritis, unspecified: Secondary | ICD-10-CM

## 2014-11-01 DIAGNOSIS — M150 Primary generalized (osteo)arthritis: Secondary | ICD-10-CM | POA: Diagnosis not present

## 2014-11-01 DIAGNOSIS — M8949 Other hypertrophic osteoarthropathy, multiple sites: Secondary | ICD-10-CM

## 2014-11-01 DIAGNOSIS — N401 Enlarged prostate with lower urinary tract symptoms: Secondary | ICD-10-CM | POA: Diagnosis not present

## 2014-11-01 DIAGNOSIS — I824Y9 Acute embolism and thrombosis of unspecified deep veins of unspecified proximal lower extremity: Secondary | ICD-10-CM

## 2014-11-01 LAB — URINALYSIS, ROUTINE W REFLEX MICROSCOPIC
Bilirubin Urine: NEGATIVE
Hgb urine dipstick: NEGATIVE
KETONES UR: NEGATIVE
Leukocytes, UA: NEGATIVE
NITRITE: POSITIVE — AB
PH: 5.5 (ref 5.0–8.0)
SPECIFIC GRAVITY, URINE: 1.015 (ref 1.000–1.030)
Total Protein, Urine: NEGATIVE
UROBILINOGEN UA: 0.2 (ref 0.0–1.0)
Urine Glucose: >=1000 — AB

## 2014-11-01 LAB — MICROALBUMIN / CREATININE URINE RATIO
CREATININE, U: 74.5 mg/dL
MICROALB/CREAT RATIO: 0.9 mg/g (ref 0.0–30.0)
Microalb, Ur: <0.7 mg/dL (ref 0.0–1.9)

## 2014-11-01 LAB — LIPID PANEL
Cholesterol: 249 mg/dL — ABNORMAL HIGH (ref 0–200)
HDL: 50.5 mg/dL (ref >39.00)
LDL Cholesterol: 160 mg/dL — ABNORMAL HIGH (ref 0–99)
NONHDL: 198.5
Total CHOL/HDL Ratio: 5
Triglycerides: 194 mg/dL — ABNORMAL HIGH (ref 0.0–149.0)
VLDL: 38.8 mg/dL (ref 0.0–40.0)

## 2014-11-01 LAB — PSA: PSA: 3.45 ng/mL (ref 0.10–4.00)

## 2014-11-01 LAB — BASIC METABOLIC PANEL
BUN: 14 mg/dL (ref 6–23)
CALCIUM: 9.6 mg/dL (ref 8.4–10.5)
CO2: 23 mEq/L (ref 19–32)
CREATININE: 1.05 mg/dL (ref 0.40–1.50)
Chloride: 106 mEq/L (ref 96–112)
GFR: 90.15 mL/min (ref >60.00)
GLUCOSE: 105 mg/dL — AB (ref 70–99)
Potassium: 4.1 mEq/L (ref 3.5–5.1)
SODIUM: 140 meq/L (ref 135–145)

## 2014-11-01 LAB — TSH: TSH: 1.83 u[IU]/mL (ref 0.35–4.50)

## 2014-11-01 LAB — FECAL OCCULT BLOOD, GUAIAC: FECAL OCCULT BLD: NEGATIVE

## 2014-11-01 LAB — HEMOGLOBIN A1C: Hgb A1c MFr Bld: 9.6 % — ABNORMAL HIGH (ref 4.6–6.5)

## 2014-11-01 MED ORDER — CELECOXIB 200 MG PO CAPS: 200.0000 mg | ORAL_CAPSULE | Freq: Every morning | ORAL | Status: DC

## 2014-11-01 MED ORDER — INSULIN GLARGINE 300 UNIT/ML ~~LOC~~ SOPN: 50.0000 [IU] | PEN_INJECTOR | Freq: Every day | SUBCUTANEOUS | Status: DC

## 2014-11-01 MED ORDER — VALSARTAN-HYDROCHLOROTHIAZIDE 320-12.5 MG PO TABS: 1.0000 | ORAL_TABLET | Freq: Every day | ORAL | Status: DC

## 2014-11-01 MED ORDER — CIPROFLOXACIN HCL 500 MG PO TABS: 500.0000 mg | ORAL_TABLET | Freq: Two times a day (BID) | ORAL | Status: DC

## 2014-11-01 MED ORDER — TRAMADOL HCL 50 MG PO TABS: 50.0000 mg | ORAL_TABLET | Freq: Three times a day (TID) | ORAL | Status: DC | PRN

## 2014-11-01 MED ORDER — GLIMEPIRIDE 2 MG PO TABS: 2.0000 mg | ORAL_TABLET | Freq: Every day | ORAL | Status: DC

## 2014-11-01 MED ORDER — SITAGLIPTIN PHOSPHATE 100 MG PO TABS: 100.0000 mg | ORAL_TABLET | Freq: Every day | ORAL | Status: DC

## 2014-11-01 MED ORDER — EZETIMIBE 10 MG PO TABS: 10.0000 mg | ORAL_TABLET | Freq: Every day | ORAL | Status: DC

## 2014-11-01 NOTE — Progress Notes (Signed)
Pre visit review using our clinic review tool, if applicable. No additional management support is needed unless otherwise documented below in the visit note. 

## 2014-11-01 NOTE — Assessment & Plan Note (Signed)
His blood sugars are very high and there is glc in the urine, I think he needs to start a basal insulin, will start toujeo, I have asked him to see diabetic education as well

## 2014-11-01 NOTE — Assessment & Plan Note (Signed)

## 2014-11-01 NOTE — Patient Instructions (Signed)

## 2014-11-01 NOTE — Assessment & Plan Note (Signed)
His BP is well controlled His lytes and renal function are stable 

## 2014-11-01 NOTE — Progress Notes (Signed)
Subjective:    Patient ID: Charles Parker, male    DOB: January 12, 1946, 69 y.o.   MRN: [id number]  Diabetes Pertinent negatives for hypoglycemia include no dizziness or tremors. Pertinent negatives for diabetes include no chest pain and no weakness.  Benign Prostatic Hypertrophy This is a chronic problem. The current episode started more than 1 year ago. Irritative symptoms include frequency and nocturia. Irritative symptoms do not include urgency. Obstructive symptoms include dribbling, incomplete emptying, an intermittent stream, a slower stream, straining and a weak stream. Pertinent negatives include no chills, dysuria, genital pain, hematuria, hesitancy, nausea or vomiting. AUA score is 8-19. Past treatments include dutasteride. The treatment provided mild relief. He has been using treatment for 2 or more years.      Review of Systems  Constitutional: Negative.  Negative for chills.  HENT: Negative.   Eyes: Negative.   Respiratory: Negative.  Negative for apnea, cough, choking, chest tightness, shortness of breath, wheezing and stridor.   Cardiovascular: Negative.  Negative for chest pain, palpitations and leg swelling.  Gastrointestinal: Negative.  Negative for nausea, vomiting, abdominal pain, constipation and blood in stool.  Endocrine: Negative.   Genitourinary: Positive for frequency, incomplete emptying and nocturia. Negative for dysuria, hesitancy, urgency and hematuria.  Musculoskeletal: Positive for arthralgias (bilateral knee pain). Negative for myalgias, back pain, joint swelling, gait problem, neck pain and neck stiffness.  Skin: Negative.  Negative for rash.  Allergic/Immunologic: Negative.   Neurological: Negative.  Negative for dizziness, tremors, weakness, light-headedness and numbness.  Hematological: Negative.  Negative for adenopathy. Does not bruise/bleed easily.  Psychiatric/Behavioral: Negative.        Objective:   Physical Exam  Constitutional: He is  oriented to person, place, and time. He appears well-developed and well-nourished. No distress.  HENT:  Head: Normocephalic and atraumatic.  Mouth/Throat: Oropharynx is clear and moist. No oropharyngeal exudate.  Eyes: Conjunctivae are normal. Right eye exhibits no discharge. Left eye exhibits no discharge. No scleral icterus.  Neck: Normal range of motion. Neck supple. No JVD present. No tracheal deviation present. No thyromegaly present.  Cardiovascular: Normal rate, regular rhythm, normal heart sounds and intact distal pulses.  Exam reveals no gallop and no friction rub.   No murmur heard. Pulmonary/Chest: Effort normal and breath sounds normal. No stridor. No respiratory distress. He has no wheezes. He has no rales. He exhibits no tenderness.  Abdominal: Soft. Bowel sounds are normal. He exhibits no distension and no mass. There is no tenderness. There is no rebound and no guarding. Hernia confirmed negative in the right inguinal area and confirmed negative in the left inguinal area.  Genitourinary: Testes normal and penis normal. Rectal exam shows external hemorrhoid and internal hemorrhoid. Rectal exam shows no fissure, no mass, no tenderness and anal tone normal. Guaiac negative stool. Prostate is enlarged (1+ smooth symm BPH). Prostate is not tender. Right testis shows no mass, no swelling and no tenderness. Right testis is descended. Left testis shows no mass, no swelling and no tenderness. Left testis is descended. Circumcised. No penile erythema or penile tenderness. No discharge found.  Musculoskeletal: Normal range of motion. He exhibits no edema or tenderness.  Lymphadenopathy:    He has no cervical adenopathy.       Right: No inguinal adenopathy present.       Left: No inguinal adenopathy present.  Neurological: He is oriented to person, place, and time.  Skin: Skin is warm and dry. No rash noted. He is not diaphoretic. No erythema. No  pallor.  Psychiatric: He has a normal mood and  affect. His behavior is normal. Judgment and thought content normal.  Vitals reviewed.    Lab Results  Component Value Date   WBC 5.0 07/10/2014   HGB 13.5 07/10/2014   HCT 41.2 07/10/2014   PLT 197.0 07/10/2014   GLUCOSE 133* 07/10/2014   CHOL 325* 07/10/2014   TRIG 315.0* 07/10/2014   HDL 40.10 07/10/2014   LDLDIRECT 221.4 07/10/2014   LDLCALC 113* 06/20/2013   ALT 36 07/10/2014   AST 36 07/10/2014   NA 139 07/10/2014   K 3.7 07/10/2014   CL 104 07/10/2014   CREATININE 1.0 07/10/2014   BUN 16 07/10/2014   CO2 23 07/10/2014   TSH 1.71 07/10/2014   PSA 2.40 06/20/2013   INR 2.1* 05/26/2009   HGBA1C 8.7* 07/10/2014   MICROALBUR 2.5* 06/20/2013       Assessment & Plan:

## 2014-11-01 NOTE — Assessment & Plan Note (Signed)
His has s/s suggestive of a prostate gland infection and an abnormal UA Will treat with cipro

## 2014-11-02 ENCOUNTER — Encounter: Payer: Self-pay | Admitting: Internal Medicine

## 2014-11-02 NOTE — Assessment & Plan Note (Signed)
His LDL is too high Will add zetia to the statin for LDL and risk reduction

## 2014-11-02 NOTE — Assessment & Plan Note (Signed)
Will cont the NOAC indefinitely

## 2014-11-07 ENCOUNTER — Encounter: Payer: Self-pay | Admitting: *Deleted

## 2014-11-07 ENCOUNTER — Encounter: Payer: Commercial Managed Care - HMO | Attending: Family Medicine | Admitting: *Deleted

## 2014-11-07 VITALS — Ht 72.0 in | Wt 200.5 lb

## 2014-11-07 DIAGNOSIS — E118 Type 2 diabetes mellitus with unspecified complications: Secondary | ICD-10-CM | POA: Diagnosis not present

## 2014-11-07 DIAGNOSIS — Z794 Long term (current) use of insulin: Secondary | ICD-10-CM | POA: Diagnosis not present

## 2014-11-07 DIAGNOSIS — Z713 Dietary counseling and surveillance: Secondary | ICD-10-CM | POA: Diagnosis not present

## 2014-11-07 DIAGNOSIS — IMO0002 Reserved for concepts with insufficient information to code with codable children: Secondary | ICD-10-CM

## 2014-11-07 DIAGNOSIS — E1165 Type 2 diabetes mellitus with hyperglycemia: Secondary | ICD-10-CM

## 2014-11-07 NOTE — Patient Instructions (Signed)
Plan:  Aim for 2-3 Carb Choices per meal such as each scoop of rice, potato, noodles or bread  Aim for 0-1 Carbs per snack if hungry  Include protein in moderation with your meals and snacks Continue with your activity level by walking daily and bowling as tolerated Consider checking BG before the meal one day and after a meal the next day

## 2014-11-07 NOTE — Progress Notes (Signed)
  Medical Nutrition Therapy:  Appt start time: 1330 end time:  1500.  Assessment:  Primary concerns today: Diabetes and nutrition education. He states he was diagnosed in 2006. No previous diabetes education. He is retired but is active with his church and he bowls twice a week. He cleans his own house. He lives alone so he shops and prepares his own meals. He gets take out often. He has a meter and he states he checks once every other day. His BG yesterday was around 148 mg/dl.   Preferred Learning Style: can see large print  Auditory  Hands on  Learning Readiness:   Ready  Change in progress  MEDICATIONS: see list. States he is not taking the insulin    DIETARY INTAKE:  24-hr recall:  B ( AM): cereal OR cafe and get sausage biscuit and coffee or diet / regular soda  Snk ( AM): not unless he has crackers or sugar free cookies L ( PM): bologna sandwich occasionally with chips OR Ramen noodles with crackers, water Snk ( PM):  D ( PM): picks up fast food or cafeteria with meat, vegetables, starch and often beans, soda or water Snk ( PM): no Beverages: coffee or diet / regular soda, water  Usual physical activity: he walks 4 laps around his neighborhood every day ( 2 miles)   Estimated energy needs: 1400 calories 158 g carbohydrates 105 g protein 39 g fat  Progress Towards Goal(s):  In progress.   Nutritional Diagnosis:  NB-1.1 Food and nutrition-related knowledge deficit As related to Diabetes.  As evidenced by A1c of 9.6%    Intervention:  Nutrition counseling and diabetes education initiated. Discussed Carb Counting by food group as method of portion control, reading food labels, and benefits of increased activity. Also discussed basic physiology of Diabetes, target BG ranges pre and post meals, and A1c.   Plan:  Aim for 2-3 Carb Choices per meal such as each scoop of rice, potato, noodles or bread  Aim for 0-1 Carbs per snack if hungry  Include protein in moderation  with your meals and snacks Continue with your activity level by walking daily and bowling as tolerated Consider checking BG before the meal one day and after a meal the next day  Teaching Method Utilized:  Visual Auditory Hands on  Handouts given during visit include: Living Well with Diabetes Carb Counting and Food Label handouts Meal Plan Card  Barriers to learning/adherence to lifestyle change: none  Demonstrated degree of understanding via:  Teach Back   Monitoring/Evaluation:  Dietary intake, exercise, reading food labels, and body weight prn.

## 2014-12-25 ENCOUNTER — Telehealth: Payer: Self-pay | Admitting: Internal Medicine

## 2014-12-25 DIAGNOSIS — E113599 Type 2 diabetes mellitus with proliferative diabetic retinopathy without macular edema, unspecified eye: Secondary | ICD-10-CM

## 2014-12-25 MED ORDER — SITAGLIPTIN PHOSPHATE 100 MG PO TABS: 100.0000 mg | ORAL_TABLET | Freq: Every day | ORAL | Status: DC

## 2014-12-25 MED ORDER — GLUCOSE BLOOD VI STRP: ORAL_STRIP | Status: DC

## 2014-12-25 NOTE — Telephone Encounter (Signed)
Pt request refill for Januvia 100mg  and truetest test strips for 90 day supply to be send to [facility]. Please help

## 2014-12-25 NOTE — Telephone Encounter (Signed)
Done

## 2015-01-01 ENCOUNTER — Ambulatory Visit (INDEPENDENT_AMBULATORY_CARE_PROVIDER_SITE_OTHER): Payer: Commercial Managed Care - HMO | Admitting: Internal Medicine

## 2015-01-01 ENCOUNTER — Telehealth: Payer: Self-pay | Admitting: Internal Medicine

## 2015-01-01 ENCOUNTER — Encounter: Payer: Self-pay | Admitting: Internal Medicine

## 2015-01-01 VITALS — BP 142/98 | HR 74 | Temp 98.4°F | Resp 20 | Ht 72.0 in | Wt 207.1 lb

## 2015-01-01 DIAGNOSIS — Z79891 Long term (current) use of opiate analgesic: Secondary | ICD-10-CM | POA: Diagnosis not present

## 2015-01-01 DIAGNOSIS — I10 Essential (primary) hypertension: Secondary | ICD-10-CM | POA: Diagnosis not present

## 2015-01-01 DIAGNOSIS — E113299 Type 2 diabetes mellitus with mild nonproliferative diabetic retinopathy without macular edema, unspecified eye: Secondary | ICD-10-CM

## 2015-01-01 DIAGNOSIS — E11329 Type 2 diabetes mellitus with mild nonproliferative diabetic retinopathy without macular edema: Secondary | ICD-10-CM

## 2015-01-01 DIAGNOSIS — I82401 Acute embolism and thrombosis of unspecified deep veins of right lower extremity: Secondary | ICD-10-CM

## 2015-01-01 DIAGNOSIS — I824Y1 Acute embolism and thrombosis of unspecified deep veins of right proximal lower extremity: Secondary | ICD-10-CM

## 2015-01-01 DIAGNOSIS — N410 Acute prostatitis: Secondary | ICD-10-CM

## 2015-01-01 MED ORDER — RIVAROXABAN 20 MG PO TABS: 20.0000 mg | ORAL_TABLET | Freq: Every day | ORAL | Status: DC

## 2015-01-01 MED ORDER — NEBIVOLOL HCL 10 MG PO TABS: 10.0000 mg | ORAL_TABLET | Freq: Every day | ORAL | Status: DC

## 2015-01-01 NOTE — Patient Instructions (Signed)

## 2015-01-01 NOTE — Progress Notes (Signed)
Subjective:    Patient ID: Charles Parker, male    DOB: Mar 06, 1946, 69 y.o.   MRN: [id number]  Hypertension This is a chronic problem. The current episode started more than 1 year ago. The problem has been gradually worsening since onset. The problem is uncontrolled. Pertinent negatives include no anxiety, blurred vision, chest pain, headaches, malaise/fatigue, neck pain, orthopnea, palpitations, peripheral edema, PND, shortness of breath or sweats. Agents associated with hypertension include NSAIDs. Past treatments include angiotensin blockers and diuretics. The current treatment provides mild improvement. Compliance problems include exercise and diet.   Diabetes Pertinent negatives for hypoglycemia include no dizziness, headaches, sweats or tremors. Pertinent negatives for diabetes include no blurred vision, no chest pain, no fatigue, no polydipsia, no polyphagia and no polyuria.      Review of Systems  Constitutional: Negative.  Negative for fever, chills, malaise/fatigue, diaphoresis, appetite change and fatigue.  HENT: Negative.   Eyes: Negative.  Negative for blurred vision.  Respiratory: Negative.  Negative for cough, choking, chest tightness, shortness of breath and stridor.   Cardiovascular: Negative.  Negative for chest pain, palpitations, orthopnea, leg swelling and PND.  Gastrointestinal: Negative.  Negative for nausea, vomiting, abdominal pain, diarrhea, constipation and blood in stool.  Endocrine: Negative.  Negative for polydipsia, polyphagia and polyuria.  Genitourinary: Negative.   Musculoskeletal: Positive for arthralgias. Negative for myalgias, back pain and neck pain.  Skin: Negative.  Negative for rash.  Allergic/Immunologic: Negative.   Neurological: Negative.  Negative for dizziness, tremors, syncope, light-headedness and headaches.  Hematological: Negative.  Negative for adenopathy. Does not bruise/bleed easily.  Psychiatric/Behavioral: Negative.          Objective:   Physical Exam  Constitutional: He is oriented to person, place, and time. He appears well-developed and well-nourished. No distress.  HENT:  Head: Normocephalic and atraumatic.  Mouth/Throat: Oropharynx is clear and moist. No oropharyngeal exudate.  Eyes: Conjunctivae are normal. Right eye exhibits no discharge. Left eye exhibits no discharge. No scleral icterus.  Neck: Normal range of motion. Neck supple. No JVD present. No tracheal deviation present. No thyromegaly present.  Cardiovascular: Normal rate, regular rhythm, normal heart sounds and intact distal pulses.  Exam reveals no gallop and no friction rub.   No murmur heard. Pulmonary/Chest: Effort normal and breath sounds normal. No stridor. No respiratory distress. He has no wheezes. He has no rales. He exhibits no tenderness.  Abdominal: Soft. Bowel sounds are normal. He exhibits no distension and no mass. There is no tenderness. There is no rebound and no guarding.  Musculoskeletal: Normal range of motion. He exhibits no edema or tenderness.  Lymphadenopathy:    He has no cervical adenopathy.  Neurological: He is oriented to person, place, and time.  Skin: Skin is warm and dry. No rash noted. He is not diaphoretic. No erythema. No pallor.  Vitals reviewed.    Lab Results  Component Value Date   WBC 5.0 07/10/2014   HGB 13.5 07/10/2014   HCT 41.2 07/10/2014   PLT 197.0 07/10/2014   GLUCOSE 105* 11/01/2014   CHOL 249* 11/01/2014   TRIG 194.0* 11/01/2014   HDL 50.50 11/01/2014   LDLDIRECT 221.4 07/10/2014   LDLCALC 160* 11/01/2014   ALT 36 07/10/2014   AST 36 07/10/2014   NA 140 11/01/2014   K 4.1 11/01/2014   CL 106 11/01/2014   CREATININE 1.05 11/01/2014   BUN 14 11/01/2014   CO2 23 11/01/2014   TSH 1.83 11/01/2014   PSA 3.45 11/01/2014  INR 2.1* 05/26/2009   HGBA1C 9.6* 11/01/2014   MICROALBUR <0.7 11/01/2014       Assessment & Plan:

## 2015-01-01 NOTE — Telephone Encounter (Signed)
emmi mailed  °

## 2015-01-01 NOTE — Progress Notes (Signed)
Pre visit review using our clinic review tool, if applicable. No additional management support is needed unless otherwise documented below in the visit note. 

## 2015-01-02 NOTE — Assessment & Plan Note (Signed)
His BP is not well controlled Will cont the ARB/HCTZ combo but will also add bystolic for better BP control

## 2015-01-02 NOTE — Assessment & Plan Note (Signed)
His blood sugars have not been well controlled He will work on his compliance with meds and lifestyle modifications

## 2015-02-05 ENCOUNTER — Encounter: Payer: Self-pay | Admitting: Internal Medicine

## 2015-03-04 ENCOUNTER — Encounter: Payer: Self-pay | Admitting: Internal Medicine

## 2015-03-04 ENCOUNTER — Ambulatory Visit (INDEPENDENT_AMBULATORY_CARE_PROVIDER_SITE_OTHER): Payer: Commercial Managed Care - HMO | Admitting: Internal Medicine

## 2015-03-04 ENCOUNTER — Other Ambulatory Visit (INDEPENDENT_AMBULATORY_CARE_PROVIDER_SITE_OTHER): Payer: Commercial Managed Care - HMO

## 2015-03-04 ENCOUNTER — Other Ambulatory Visit: Payer: Self-pay | Admitting: Internal Medicine

## 2015-03-04 VITALS — BP 120/68 | HR 76 | Temp 97.7°F | Resp 16 | Ht 72.0 in | Wt 204.0 lb

## 2015-03-04 DIAGNOSIS — E785 Hyperlipidemia, unspecified: Secondary | ICD-10-CM

## 2015-03-04 DIAGNOSIS — I10 Essential (primary) hypertension: Secondary | ICD-10-CM

## 2015-03-04 DIAGNOSIS — E11311 Type 2 diabetes mellitus with unspecified diabetic retinopathy with macular edema: Secondary | ICD-10-CM

## 2015-03-04 LAB — CBC WITH DIFFERENTIAL/PLATELET
BASOS PCT: 0.5 % (ref 0.0–3.0)
Basophils Absolute: 0 10*3/uL (ref 0.0–0.1)
EOS ABS: 0 10*3/uL (ref 0.0–0.7)
EOS PCT: 0.5 % (ref 0.0–5.0)
HCT: 44 % (ref 39.0–52.0)
Hemoglobin: 15.1 g/dL (ref 13.0–17.0)
LYMPHS PCT: 40.6 % (ref 12.0–46.0)
Lymphs Abs: 1.8 10*3/uL (ref 0.7–4.0)
MCHC: 34.3 g/dL (ref 30.0–36.0)
MCV: 92.4 fl (ref 78.0–100.0)
Monocytes Absolute: 0.5 10*3/uL (ref 0.1–1.0)
Monocytes Relative: 10.9 % (ref 3.0–12.0)
NEUTROS ABS: 2.1 10*3/uL (ref 1.4–7.7)
Neutrophils Relative %: 47.5 % (ref 43.0–77.0)
Platelets: 174 10*3/uL (ref 150.0–400.0)
RBC: 4.76 Mil/uL (ref 4.22–5.81)
RDW: 13.7 % (ref 11.5–15.5)
WBC: 4.3 10*3/uL (ref 4.0–10.5)

## 2015-03-04 LAB — LDL CHOLESTEROL, DIRECT: LDL DIRECT: 201 mg/dL

## 2015-03-04 LAB — BASIC METABOLIC PANEL
BUN: 18 mg/dL (ref 6–23)
CO2: 27 mEq/L (ref 19–32)
Calcium: 9.8 mg/dL (ref 8.4–10.5)
Chloride: 102 mEq/L (ref 96–112)
Creatinine, Ser: 1.21 mg/dL (ref 0.40–1.50)
GFR: 76.47 mL/min (ref >60.00)
GLUCOSE: 304 mg/dL — AB (ref 70–99)
Potassium: 4 mEq/L (ref 3.5–5.1)
Sodium: 136 mEq/L (ref 135–145)

## 2015-03-04 LAB — LIPID PANEL
CHOLESTEROL: 299 mg/dL — AB (ref 0–200)
HDL: 45.6 mg/dL (ref >39.00)
NonHDL: 253.4
Total CHOL/HDL Ratio: 7
Triglycerides: 241 mg/dL — ABNORMAL HIGH (ref 0.0–149.0)
VLDL: 48.2 mg/dL — ABNORMAL HIGH (ref 0.0–40.0)

## 2015-03-04 LAB — HEMOGLOBIN A1C: Hgb A1c MFr Bld: 9.5 % — ABNORMAL HIGH (ref 4.6–6.5)

## 2015-03-04 NOTE — Progress Notes (Signed)
Subjective:  Patient ID: Charles Parker, male    DOB: 25-Sep-1946  Age: 69 y.o. MRN: [id number]  CC: Hypertension; Diabetes; and Hyperlipidemia   HPI Charles Parker presents for follow up on chronic medical issues, he feels well and offers no new complaints.  Outpatient Prescriptions Prior to Visit  Medication Sig Dispense Refill  . canagliflozin (INVOKANA) 300 MG TABS tablet Take 300 mg by mouth daily before breakfast. 90 tablet 3  . celecoxib (CELEBREX) 200 MG capsule Take 1 capsule (200 mg total) by mouth every morning. 90 capsule 3  . Colesevelam HCl 3.75 G PACK Take 1 packet by mouth daily. 90 each 3  . cyclobenzaprine (FLEXERIL) 10 MG tablet Take 1 tablet (10 mg total) by mouth 2 (two) times daily as needed for muscle spasms. 10 tablet 0  . dutasteride (AVODART) 0.5 MG capsule Take 1 capsule (0.5 mg total) by mouth daily. 90 capsule 2  . ezetimibe (ZETIA) 10 MG tablet Take 1 tablet (10 mg total) by mouth daily. 90 tablet 3  . glimepiride (AMARYL) 2 MG tablet Take 1 tablet (2 mg total) by mouth daily before breakfast. 90 tablet 1  . glucose blood (TRUETEST TEST) test strip Test up to three times daily 100 each 12  . Insulin Glargine (TOUJEO SOLOSTAR) 300 UNIT/ML SOPN Inject 50 Units into the skin daily. 1.5 mL 11  . nebivolol (BYSTOLIC) 10 MG tablet Take 1 tablet (10 mg total) by mouth daily. 30 tablet 11  . rivaroxaban (XARELTO) 20 MG TABS tablet Take 1 tablet (20 mg total) by mouth daily with supper. 30 tablet 11  . sertraline (ZOLOFT) 50 MG tablet Take 1 tablet (50 mg total) by mouth daily. 30 tablet 11  . simvastatin (ZOCOR) 40 MG tablet Take 1 tablet (40 mg total) by mouth every evening. 90 tablet 3  . sitaGLIPtin (JANUVIA) 100 MG tablet Take 1 tablet (100 mg total) by mouth daily. 90 tablet 3  . tadalafil (CIALIS) 5 MG tablet Take 1 tablet (5 mg total) by mouth daily as needed for erectile dysfunction. 30 tablet 11  . traMADol (ULTRAM) 50 MG tablet Take 1 tablet (50 mg  total) by mouth every 8 (eight) hours as needed. 60 tablet 5  . valsartan-hydrochlorothiazide (DIOVAN-HCT) 320-12.5 MG per tablet Take 1 tablet by mouth daily. 90 tablet 3   No facility-administered medications prior to visit.    ROS Review of Systems  Constitutional: Negative.  Negative for fever, chills, diaphoresis, appetite change and fatigue.  HENT: Negative.  Negative for trouble swallowing and voice change.   Eyes: Negative.   Respiratory: Negative.  Negative for cough, choking, chest tightness, shortness of breath and stridor.   Cardiovascular: Negative.  Negative for chest pain, palpitations and leg swelling.  Gastrointestinal: Negative.  Negative for nausea, vomiting, abdominal pain, diarrhea, constipation and blood in stool.  Endocrine: Negative.  Negative for polydipsia, polyphagia and polyuria.  Genitourinary: Negative.   Musculoskeletal: Positive for arthralgias. Negative for myalgias, back pain, joint swelling, gait problem, neck pain and neck stiffness.  Skin: Negative.  Negative for rash.  Allergic/Immunologic: Negative.   Neurological: Negative.   Hematological: Negative.  Negative for adenopathy. Does not bruise/bleed easily.  Psychiatric/Behavioral: Negative.     Objective:  BP 120/68 mmHg  Pulse 76  Temp(Src) 97.7 F (36.5 C) (Oral)  Resp 16  Ht 6' (1.829 m)  Wt 204 lb (92.534 kg)  BMI 27.66 kg/m2  SpO2 94%  BP Readings from Last 3 Encounters:  03/04/15  120/68  01/01/15 142/98  11/01/14 120/86    Wt Readings from Last 3 Encounters:  03/04/15 204 lb (92.534 kg)  01/01/15 207 lb 1.9 oz (93.949 kg)  11/07/14 200 lb 8 oz (90.946 kg)    Physical Exam  Constitutional: He is oriented to person, place, and time. He appears well-developed and well-nourished. No distress.  HENT:  Head: Normocephalic and atraumatic.  Nose: Nose normal.  Mouth/Throat: Oropharynx is clear and moist.  Eyes: Conjunctivae are normal. Right eye exhibits no discharge. Left eye  exhibits no discharge. No scleral icterus.  Neck: Normal range of motion. Neck supple. No JVD present. No tracheal deviation present. No thyromegaly present.  Cardiovascular: Normal rate, regular rhythm and intact distal pulses.  Exam reveals no gallop and no friction rub.   No murmur heard. Pulmonary/Chest: Effort normal and breath sounds normal. No stridor. No respiratory distress. He has no wheezes. He has no rales. He exhibits no tenderness.  Abdominal: Soft. Bowel sounds are normal. He exhibits no distension and no mass. There is no tenderness. There is no rebound and no guarding.  Musculoskeletal: Normal range of motion. He exhibits no edema or tenderness.  Lymphadenopathy:    He has no cervical adenopathy.  Neurological: He is oriented to person, place, and time.  Skin: Skin is warm and dry. No rash noted. He is not diaphoretic. No erythema. No pallor.  Psychiatric: He has a normal mood and affect. His behavior is normal. Judgment and thought content normal.  Vitals reviewed.   Lab Results  Component Value Date   WBC 5.0 07/10/2014   HGB 13.5 07/10/2014   HCT 41.2 07/10/2014   PLT 197.0 07/10/2014   GLUCOSE 105* 11/01/2014   CHOL 249* 11/01/2014   TRIG 194.0* 11/01/2014   HDL 50.50 11/01/2014   LDLDIRECT 221.4 07/10/2014   LDLCALC 160* 11/01/2014   ALT 36 07/10/2014   AST 36 07/10/2014   NA 140 11/01/2014   K 4.1 11/01/2014   CL 106 11/01/2014   CREATININE 1.05 11/01/2014   BUN 14 11/01/2014   CO2 23 11/01/2014   TSH 1.83 11/01/2014   PSA 3.45 11/01/2014   INR 2.1* 05/26/2009   HGBA1C 9.6* 11/01/2014   MICROALBUR <0.7 11/01/2014    Ct Abdomen Pelvis W Contrast  12/26/2013   CLINICAL DATA:  Left lower quadrant tenderness, postprandial pain.  EXAM: CT ABDOMEN AND PELVIS WITH CONTRAST  TECHNIQUE: Multidetector CT imaging of the abdomen and pelvis was performed using the standard protocol following bolus administration of intravenous contrast.  CONTRAST:  181m OMNIPAQUE  IOHEXOL 300 MG/ML  SOLN  COMPARISON:  02/22/2012  FINDINGS: Lung bases are clear.  No effusions.  Heart is normal size.  Small scattered hypodensities in the liver are stable since prior study compatible with small cysts. Multiple bilateral renal cysts are again noted and unchanged, the largest off the superior pole of the right kidney measuring 7.1 cm.  Appendix is visualized and is normal. Stomach and small bowel are unremarkable. Moderate distention of the colon with stool and gas. No inflammatory process. Scattered sigmoid diverticula. No active diverticulitis. Postsurgical changes noted in the rectum.  Prostate is enlarged. Urinary bladder is unremarkable. No free fluid, free air or adenopathy. Aorta and iliac vessels are normal caliber.  No acute bony abnormality.  IMPRESSION: Moderate distention of the colon with gas and stool. Scattered sigmoid diverticula. No active diverticulitis.  Stable hepatic and renal cysts.  Prostate enlargement.  No acute findings in the abdomen or pelvis.  Electronically Signed   By: Rolm Baptise M.D.   On: 12/26/2013 17:02    Assessment & Plan:   Charles Parker was seen today for hypertension, diabetes and hyperlipidemia.  Diagnoses and all orders for this visit:  Essential hypertension - his BP is well controlled, will monitor his lytes and renal function  Type 2 diabetes mellitus with diabetic retinopathy and macular edema, with unspecified retinopathy severity - I will recheck his A1C, if blood sugars are not well controlled will refer to ENDO for further evaluation   Hyperlipidemia with target LDL less than 70 - he is doing well on the statin, will check FLP today to see if he has met his LDL goal   I am having Charles Parker maintain his sertraline, cyclobenzaprine, Colesevelam HCl, dutasteride, tadalafil, simvastatin, canagliflozin, celecoxib, glimepiride, traMADol, valsartan-hydrochlorothiazide, Insulin Glargine, ezetimibe, sitaGLIPtin, glucose blood, nebivolol, and  rivaroxaban.  No orders of the defined types were placed in this encounter.     Follow-up: Return in about 4 months (around 07/04/2015).  Scarlette Calico, MD

## 2015-03-04 NOTE — Patient Instructions (Signed)

## 2015-03-07 DIAGNOSIS — H2513 Age-related nuclear cataract, bilateral: Secondary | ICD-10-CM | POA: Diagnosis not present

## 2015-03-07 DIAGNOSIS — H40013 Open angle with borderline findings, low risk, bilateral: Secondary | ICD-10-CM | POA: Diagnosis not present

## 2015-04-21 ENCOUNTER — Telehealth: Payer: Self-pay | Admitting: Internal Medicine

## 2015-04-21 ENCOUNTER — Other Ambulatory Visit: Payer: Self-pay | Admitting: Internal Medicine

## 2015-04-21 DIAGNOSIS — M8949 Other hypertrophic osteoarthropathy, multiple sites: Secondary | ICD-10-CM

## 2015-04-21 DIAGNOSIS — M150 Primary generalized (osteo)arthritis: Principal | ICD-10-CM

## 2015-04-21 DIAGNOSIS — M159 Polyosteoarthritis, unspecified: Secondary | ICD-10-CM

## 2015-04-21 MED ORDER — CELECOXIB 200 MG PO CAPS: 200.0000 mg | ORAL_CAPSULE | Freq: Every morning | ORAL | Status: DC

## 2015-04-21 NOTE — Telephone Encounter (Signed)
Pt request refill for celecoxib (CELEBREX) 200 MG capsule to be send into [facility]. Please advise, pt is out of this med

## 2015-04-25 ENCOUNTER — Ambulatory Visit: Payer: Commercial Managed Care - HMO | Admitting: Internal Medicine

## 2015-05-21 ENCOUNTER — Other Ambulatory Visit: Payer: Self-pay | Admitting: Internal Medicine

## 2015-06-18 ENCOUNTER — Other Ambulatory Visit (INDEPENDENT_AMBULATORY_CARE_PROVIDER_SITE_OTHER): Payer: Commercial Managed Care - HMO

## 2015-06-18 ENCOUNTER — Ambulatory Visit (INDEPENDENT_AMBULATORY_CARE_PROVIDER_SITE_OTHER): Payer: Commercial Managed Care - HMO | Admitting: Internal Medicine

## 2015-06-18 ENCOUNTER — Encounter: Payer: Self-pay | Admitting: Internal Medicine

## 2015-06-18 VITALS — BP 142/86 | HR 84 | Temp 98.2°F | Resp 16 | Ht 72.0 in | Wt 193.0 lb

## 2015-06-18 DIAGNOSIS — R0609 Other forms of dyspnea: Secondary | ICD-10-CM

## 2015-06-18 DIAGNOSIS — E785 Hyperlipidemia, unspecified: Secondary | ICD-10-CM | POA: Diagnosis not present

## 2015-06-18 DIAGNOSIS — E113319 Type 2 diabetes mellitus with moderate nonproliferative diabetic retinopathy with macular edema, unspecified eye: Secondary | ICD-10-CM

## 2015-06-18 DIAGNOSIS — Z23 Encounter for immunization: Secondary | ICD-10-CM

## 2015-06-18 DIAGNOSIS — I10 Essential (primary) hypertension: Secondary | ICD-10-CM

## 2015-06-18 DIAGNOSIS — E113599 Type 2 diabetes mellitus with proliferative diabetic retinopathy without macular edema, unspecified eye: Secondary | ICD-10-CM

## 2015-06-18 DIAGNOSIS — R0600 Dyspnea, unspecified: Secondary | ICD-10-CM | POA: Insufficient documentation

## 2015-06-18 DIAGNOSIS — E11331 Type 2 diabetes mellitus with moderate nonproliferative diabetic retinopathy with macular edema: Secondary | ICD-10-CM | POA: Diagnosis not present

## 2015-06-18 LAB — LIPID PANEL
CHOLESTEROL: 231 mg/dL — AB (ref 0–200)
HDL: 43.6 mg/dL (ref >39.00)
LDL CALC: 151 mg/dL — AB (ref 0–99)
NonHDL: 187.08
TRIGLYCERIDES: 179 mg/dL — AB (ref 0.0–149.0)
Total CHOL/HDL Ratio: 5
VLDL: 35.8 mg/dL (ref 0.0–40.0)

## 2015-06-18 LAB — BASIC METABOLIC PANEL
BUN: 9 mg/dL (ref 6–23)
CALCIUM: 9.6 mg/dL (ref 8.4–10.5)
CO2: 26 meq/L (ref 19–32)
CREATININE: 0.89 mg/dL (ref 0.40–1.50)
Chloride: 100 mEq/L (ref 96–112)
GFR: 108.9 mL/min (ref >60.00)
Glucose, Bld: 408 mg/dL — ABNORMAL HIGH (ref 70–99)
Potassium: 3.8 mEq/L (ref 3.5–5.1)
SODIUM: 134 meq/L — AB (ref 135–145)

## 2015-06-18 LAB — HEMOGLOBIN A1C: Hgb A1c MFr Bld: 13.6 % — ABNORMAL HIGH (ref 4.6–6.5)

## 2015-06-18 MED ORDER — SIMVASTATIN 40 MG PO TABS: 40.0000 mg | ORAL_TABLET | Freq: Every evening | ORAL | Status: DC

## 2015-06-18 MED ORDER — INSULIN GLARGINE 300 UNIT/ML ~~LOC~~ SOPN: 50.0000 [IU] | PEN_INJECTOR | Freq: Every day | SUBCUTANEOUS | Status: DC

## 2015-06-18 NOTE — Progress Notes (Signed)
Subjective:  Patient ID: Charles Parker, male    DOB: 1945/11/13  Age: 69 y.o. MRN: [id number]  CC: Hypertension and Diabetes   HPI Charles Parker presents for follow up on DM2 - he has not been using the insulin or taking some of his meds. He has issues with cost/compliance/knowledge. He also complains that over the last few months he has developed DOE with extremes of exertion.  Outpatient Prescriptions Prior to Visit  Medication Sig Dispense Refill  . canagliflozin (INVOKANA) 300 MG TABS tablet Take 300 mg by mouth daily before breakfast. 90 tablet 3  . celecoxib (CELEBREX) 200 MG capsule Take 1 capsule (200 mg total) by mouth every morning. 90 capsule 3  . Colesevelam HCl 3.75 G PACK Take 1 packet by mouth daily. 90 each 3  . cyclobenzaprine (FLEXERIL) 10 MG tablet Take 1 tablet (10 mg total) by mouth 2 (two) times daily as needed for muscle spasms. 10 tablet 0  . dutasteride (AVODART) 0.5 MG capsule Take 1 capsule (0.5 mg total) by mouth daily. 90 capsule 2  . ezetimibe (ZETIA) 10 MG tablet Take 1 tablet (10 mg total) by mouth daily. 90 tablet 3  . glimepiride (AMARYL) 2 MG tablet TAKE 1 TABLET DAILY BEFORE BREAKFAST 90 tablet 0  . glucose blood (TRUETEST TEST) test strip Test up to three times daily 100 each 12  . rivaroxaban (XARELTO) 20 MG TABS tablet Take 1 tablet (20 mg total) by mouth daily with supper. 30 tablet 11  . sertraline (ZOLOFT) 50 MG tablet Take 1 tablet (50 mg total) by mouth daily. 30 tablet 11  . simvastatin (ZOCOR) 40 MG tablet Take 1 tablet (40 mg total) by mouth every evening. 90 tablet 3  . sitaGLIPtin (JANUVIA) 100 MG tablet Take 1 tablet (100 mg total) by mouth daily. 90 tablet 3  . tadalafil (CIALIS) 5 MG tablet Take 1 tablet (5 mg total) by mouth daily as needed for erectile dysfunction. 30 tablet 11  . traMADol (ULTRAM) 50 MG tablet Take 1 tablet (50 mg total) by mouth every 8 (eight) hours as needed. 60 tablet 5  . valsartan-hydrochlorothiazide  (DIOVAN-HCT) 320-12.5 MG per tablet Take 1 tablet by mouth daily. 90 tablet 3  . Insulin Glargine (TOUJEO SOLOSTAR) 300 UNIT/ML SOPN Inject 50 Units into the skin daily. 1.5 mL 11  . nebivolol (BYSTOLIC) 10 MG tablet Take 1 tablet (10 mg total) by mouth daily. 30 tablet 11   No facility-administered medications prior to visit.    ROS Review of Systems  Constitutional: Negative.  Negative for fever, chills, diaphoresis, appetite change and fatigue.  HENT: Negative.   Eyes: Negative.   Respiratory: Positive for shortness of breath. Negative for apnea, cough, choking, chest tightness, wheezing and stridor.   Cardiovascular: Negative.  Negative for chest pain, palpitations and leg swelling.  Gastrointestinal: Negative.  Negative for nausea, vomiting, abdominal pain, diarrhea, constipation and blood in stool.  Endocrine: Positive for polydipsia, polyphagia and polyuria.  Genitourinary: Negative.   Musculoskeletal: Negative.  Negative for myalgias, back pain and arthralgias.  Skin: Negative.   Allergic/Immunologic: Negative.   Neurological: Negative.  Negative for dizziness, tremors, speech difficulty, weakness, light-headedness and numbness.  Hematological: Negative.   Psychiatric/Behavioral: Negative.     Objective:  BP 142/86 mmHg  Pulse 84  Temp(Src) 98 F (36.7 C) (Oral)  Ht 6' (1.829 m)  Wt 193 lb 4 oz (87.658 kg)  BMI 26.20 kg/m2  SpO2 98%  BP Readings from Last 3  Encounters:  06/18/15 142/86  03/04/15 120/68  01/01/15 142/98    Wt Readings from Last 3 Encounters:  06/18/15 193 lb 4 oz (87.658 kg)  03/04/15 204 lb (92.534 kg)  01/01/15 207 lb 1.9 oz (93.949 kg)    Physical Exam  Constitutional: He is oriented to person, place, and time. No distress.  HENT:  Mouth/Throat: Oropharynx is clear and moist. No oropharyngeal exudate.  Eyes: Conjunctivae are normal. Right eye exhibits no discharge. Left eye exhibits no discharge. No scleral icterus.  Neck: Normal range of  motion. Neck supple. No JVD present. No tracheal deviation present. No thyromegaly present.  Cardiovascular: Normal rate, regular rhythm, normal heart sounds and intact distal pulses.  Exam reveals no gallop and no friction rub.   No murmur heard. EKG today  Sinus  Bradycardia  WITHIN NORMAL LIMITS  Pulmonary/Chest: Effort normal and breath sounds normal. No stridor. No respiratory distress. He has no wheezes. He has no rales. He exhibits no tenderness.  Abdominal: Soft. Bowel sounds are normal. He exhibits no distension and no mass. There is no tenderness. There is no rebound and no guarding.  Musculoskeletal: Normal range of motion. He exhibits no edema or tenderness.  Lymphadenopathy:    He has no cervical adenopathy.  Neurological: He is oriented to person, place, and time.  Skin: Skin is warm and dry. No rash noted. He is not diaphoretic. No erythema. No pallor.  Vitals reviewed.   Lab Results  Component Value Date   WBC 4.3 03/04/2015   HGB 15.1 03/04/2015   HCT 44.0 03/04/2015   PLT 174.0 03/04/2015   GLUCOSE 408* 06/18/2015   CHOL 231* 06/18/2015   TRIG 179.0* 06/18/2015   HDL 43.60 06/18/2015   LDLDIRECT 201.0 03/04/2015   LDLCALC 151* 06/18/2015   ALT 36 07/10/2014   AST 36 07/10/2014   NA 134* 06/18/2015   K 3.8 06/18/2015   CL 100 06/18/2015   CREATININE 0.89 06/18/2015   BUN 9 06/18/2015   CO2 26 06/18/2015   TSH 1.83 11/01/2014   PSA 3.45 11/01/2014   INR 2.1* 05/26/2009   HGBA1C 13.6* 06/18/2015   MICROALBUR <0.7 11/01/2014    Ct Abdomen Pelvis W Contrast  12/26/2013   CLINICAL DATA:  Left lower quadrant tenderness, postprandial pain.  EXAM: CT ABDOMEN AND PELVIS WITH CONTRAST  TECHNIQUE: Multidetector CT imaging of the abdomen and pelvis was performed using the standard protocol following bolus administration of intravenous contrast.  CONTRAST:  14mL OMNIPAQUE IOHEXOL 300 MG/ML  SOLN  COMPARISON:  02/22/2012  FINDINGS: Lung bases are clear.  No effusions.   Heart is normal size.  Small scattered hypodensities in the liver are stable since prior study compatible with small cysts. Multiple bilateral renal cysts are again noted and unchanged, the largest off the superior pole of the right kidney measuring 7.1 cm.  Appendix is visualized and is normal. Stomach and small bowel are unremarkable. Moderate distention of the colon with stool and gas. No inflammatory process. Scattered sigmoid diverticula. No active diverticulitis. Postsurgical changes noted in the rectum.  Prostate is enlarged. Urinary bladder is unremarkable. No free fluid, free air or adenopathy. Aorta and iliac vessels are normal caliber.  No acute bony abnormality.  IMPRESSION: Moderate distention of the colon with gas and stool. Scattered sigmoid diverticula. No active diverticulitis.  Stable hepatic and renal cysts.  Prostate enlargement.  No acute findings in the abdomen or pelvis.   Electronically Signed   By: Rolm Baptise M.D.  On: 12/26/2013 17:02    Assessment & Plan:   Avett was seen today for hypertension and diabetes.  Diagnoses and all orders for this visit:  Type 2 diabetes mellitus with moderate nonproliferative diabetic retinopathy with macular edema- his blood sugars are not well-controlled, he has not been using his insulin and has not been taking some of his medications. He has several obstacles and issues, I have asked him to see diabetic education. He will restart the insulin therapy once a day. He will be compliant with his other medications as well. He sees endocrinology in the next few weeks. -     Insulin Glargine (TOUJEO SOLOSTAR) 300 UNIT/ML SOPN; Inject 50 Units into the skin daily. -     Amb Referral to Nutrition and Diabetic E -     Basic metabolic panel; Future -     Hemoglobin A1c; Future -     Lipid panel; Future  Essential hypertension- his blood pressure is well-controlled, electrolytes and renal function are stable. -     Basic metabolic panel;  Future  Type 2 diabetes mellitus with proliferative diabetic retinopathy without macular edema -     Insulin Glargine (TOUJEO SOLOSTAR) 300 UNIT/ML SOPN; Inject 50 Units into the skin daily.  DOE (dyspnea on exertion)- his exam and EKG are normal though he is high risk for coronary artery disease. I have asked him to have an exercise treadmill test performed. -     EKG 12-Lead -     Exercise Tolerance Test; Future  Need for prophylactic vaccination against Streptococcus pneumoniae (pneumococcus) -     Pneumococcal polysaccharide vaccine 23-valent greater than or equal to 2yo subcutaneous/IM  Hyperlipidemia with target LDL less than 70- he has not been taking his statin and his LDL is not controlled, I have asked him to restart statin therapy. -     Lipid panel; Future   I have discontinued Mr. Fahmy's nebivolol. I am also having him maintain his sertraline, cyclobenzaprine, Colesevelam HCl, dutasteride, tadalafil, simvastatin, canagliflozin, traMADol, valsartan-hydrochlorothiazide, ezetimibe, sitaGLIPtin, glucose blood, rivaroxaban, celecoxib, glimepiride, and Insulin Glargine.  Meds ordered this encounter  Medications  . Insulin Glargine (TOUJEO SOLOSTAR) 300 UNIT/ML SOPN    Sig: Inject 50 Units into the skin daily.    Dispense:  1.5 mL    Refill:  11     Follow-up: Return in about 4 months (around 10/18/2015).  Scarlette Calico, MD

## 2015-06-18 NOTE — Progress Notes (Signed)
Pre visit review using our clinic review tool, if applicable. No additional management support is needed unless otherwise documented below in the visit note. 

## 2015-06-18 NOTE — Patient Instructions (Signed)

## 2015-06-19 DIAGNOSIS — I10 Essential (primary) hypertension: Secondary | ICD-10-CM | POA: Diagnosis not present

## 2015-06-19 DIAGNOSIS — E785 Hyperlipidemia, unspecified: Secondary | ICD-10-CM | POA: Diagnosis not present

## 2015-06-19 DIAGNOSIS — R0609 Other forms of dyspnea: Secondary | ICD-10-CM | POA: Diagnosis not present

## 2015-06-19 DIAGNOSIS — Z23 Encounter for immunization: Secondary | ICD-10-CM

## 2015-06-19 DIAGNOSIS — E11331 Type 2 diabetes mellitus with moderate nonproliferative diabetic retinopathy with macular edema: Secondary | ICD-10-CM | POA: Diagnosis not present

## 2015-06-19 NOTE — Patient Outreach (Signed)
[place] [facility]) Care Management  06/19/2015  TRAVER MECKES 1946-08-22 [id number]   Referral from MD via EPIC referral system, assigned Erenest Rasher, RN to outreach.  Thanks, Ronnell Freshwater. [place], [place] Assistant Phone: [phone] Fax: [phone]

## 2015-06-27 ENCOUNTER — Encounter: Payer: Self-pay | Admitting: Internal Medicine

## 2015-06-27 ENCOUNTER — Ambulatory Visit (INDEPENDENT_AMBULATORY_CARE_PROVIDER_SITE_OTHER): Payer: Commercial Managed Care - HMO | Admitting: Internal Medicine

## 2015-06-27 VITALS — BP 132/80 | HR 64 | Temp 97.5°F | Resp 12 | Wt 193.0 lb

## 2015-06-27 DIAGNOSIS — E113319 Type 2 diabetes mellitus with moderate nonproliferative diabetic retinopathy with macular edema, unspecified eye: Secondary | ICD-10-CM

## 2015-06-27 DIAGNOSIS — E11331 Type 2 diabetes mellitus with moderate nonproliferative diabetic retinopathy with macular edema: Secondary | ICD-10-CM | POA: Diagnosis not present

## 2015-06-27 MED ORDER — GLIMEPIRIDE 2 MG PO TABS: 2.0000 mg | ORAL_TABLET | Freq: Every day | ORAL | Status: DC

## 2015-06-27 NOTE — Patient Instructions (Addendum)
Please continue Invokana 300 mg in am. Please increased Amaryl 2 mg 2x a day, before breakfast and dinner.  Check sugars 2x a day, rotating check times.  Please let me know if the sugars are consistently <70 or >200.  Please return in 1 month with your sugar log.   Please schedule an appt with Antonieta Iba with nutrition.  PATIENT INSTRUCTIONS FOR TYPE 2 DIABETES:  **Please join MyChart!** - see attached instructions about how to join if you have not done so already.  DIET AND EXERCISE Diet and exercise is an important part of diabetic treatment.  We recommended aerobic exercise in the form of brisk walking (working between 40-60% of maximal aerobic capacity, similar to brisk walking) for 150 minutes per week (such as 30 minutes five days per week) along with 3 times per week performing 'resistance' training (using various gauge rubber tubes with handles) 5-10 exercises involving the major muscle groups (upper body, lower body and core) performing 10-15 repetitions (or near fatigue) each exercise. Start at half the above goal but build slowly to reach the above goals. If limited by weight, joint pain, or disability, we recommend daily walking in a swimming pool with water up to waist to reduce pressure from joints while allow for adequate exercise.    BLOOD GLUCOSES Monitoring your blood glucoses is important for continued management of your diabetes. Please check your blood glucoses 2-4 times a day: fasting, before meals and at bedtime (you can rotate these measurements - e.g. one day check before the 3 meals, the next day check before 2 of the meals and before bedtime, etc.).   HYPOGLYCEMIA (low blood sugar) Hypoglycemia is usually a reaction to not eating, exercising, or taking too much insulin/ other diabetes drugs.  Symptoms include tremors, sweating, hunger, confusion, headache, etc. Treat IMMEDIATELY with 15 grams of Carbs: . 4 glucose tablets .  cup regular juice/soda . 2  tablespoons raisins . 4 teaspoons sugar . 1 tablespoon honey Recheck blood glucose in 15 mins and repeat above if still symptomatic/blood glucose <100.  RECOMMENDATIONS TO REDUCE YOUR RISK OF DIABETIC COMPLICATIONS: * Take your prescribed MEDICATION(S) * Follow a DIABETIC diet: Complex carbs, fiber rich foods, (monounsaturated and polyunsaturated) fats * AVOID saturated/trans fats, high fat foods, >2,300 mg salt per day. * EXERCISE at least 5 times a week for 30 minutes or preferably daily.  * DO NOT SMOKE OR DRINK more than 1 drink a day. * Check your FEET every day. Do not wear tightfitting shoes. Contact us if you develop an ulcer * See your EYE doctor once a year or more if needed * Get a FLU shot once a year * Get a PNEUMONIA vaccine once before and once after age 71 years  GOALS:  * Your Hemoglobin A1c of <7%  * fasting sugars need to be <130 * after meals sugars need to be <180 (2h after you start eating) * Your Systolic BP should be 703 or lower  * Your Diastolic BP should be 80 or lower  * Your HDL (Good Cholesterol) should be 40 or higher  * Your LDL (Bad Cholesterol) should be 100 or lower. * Your Triglycerides should be 150 or lower  * Your Urine microalbumin (kidney function) should be <30 * Your Body Mass Index should be 25 or lower    Please consider the following ways to cut down carbs and fat and increase fiber and micronutrients in your diet: - substitute whole grain for white bread or  pasta - substitute brown rice for white rice - substitute 90-calorie flat bread pieces for slices of bread when possible - substitute sweet potatoes or yams for white potatoes - substitute humus for margarine - substitute tofu for cheese when possible - substitute almond or rice milk for regular milk (would not drink soy milk daily due to concern for soy estrogen influence on breast cancer risk) - substitute dark chocolate for other sweets when possible - substitute water - can  add lemon or orange slices for taste - for diet sodas (artificial sweeteners will trick your body that you can eat sweets without getting calories and will lead you to overeating and weight gain in the long run) - do not skip breakfast or other meals (this will slow down the metabolism and will result in more weight gain over time)  - can try smoothies made from fruit and almond/rice milk in am instead of regular breakfast - can also try old-fashioned (not instant) oatmeal made with almond/rice milk in am - order the dressing on the side when eating salad at a restaurant (pour less than half of the dressing on the salad) - eat as little meat as possible - can try juicing, but should not forget that juicing will get rid of the fiber, so would alternate with eating raw veg./fruits or drinking smoothies - use as little oil as possible, even when using olive oil - can dress a salad with a mix of balsamic vinegar and lemon juice, for e.g. - use agave nectar, stevia sugar, or regular sugar rather than artificial sweateners - steam or broil/roast veggies  - snack on veggies/fruit/nuts (unsalted, preferably) when possible, rather than processed foods - reduce or eliminate aspartame in diet (it is in diet sodas, chewing gum, etc) Read the labels!  Try to read Dr. Janene Harvey book: "Program for Reversing Diabetes" for other ideas for healthy eating.

## 2015-06-27 NOTE — Progress Notes (Signed)
Patient ID: Charles Parker, male   DOB: March 15, 1946, 69 y.o.   MRN: [id number]  HPI: Charles Parker is a 69 y.o.-year-old male, referred by his PCP, Dr. 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  Last hemoglobin A1c was: Lab Results  Component Value Date   HGBA1C 13.6* 06/18/2015   HGBA1C 9.5* 03/04/2015   HGBA1C 9.6* 11/01/2014   before last hemoglobin A1c, he was out of his diabetes medicines x 4 days.  Pt is on a regimen of (he is not sure what exactly he is taking, he remembers he takes 2 meds): - Amaryl 2 mg in a.m. - Invokana 300 mg in a.m. ? Colesevelam 3.75 mg daily She was started on Toujeo 50 units at bedtime - added recently by PCP after last hemoglobin A1c returned high >> pt tells me he was not aware of this >> did not start.  He stopped Januvia 100 mg in a.m. - expensive  Pt checks his sugars 0-1 a day and they are (no log, no meter) - did not check it in a week (after he restarted the meds): - am: 160-201 - 2h after b'fast: n/c - before lunch: n/c - 2h after lunch: n/c - before dinner: 135-140 - 2h after dinner: 145 - bedtime: n/c - nighttime: n/c No lows. Lowest sugar was 135; he has hypoglycemia awareness at 90.  Highest sugar was 303.  Glucometer: TruTest  Pt's meals are: - Breakfast: biscuit  - Lunch: [place] meat - Dinner: Hungry man or PB and jelly or green beans, kale - Snacks: banana, cake, peppermint candy  - no CKD, last BUN/creatinine:  Lab Results  Component Value Date   BUN 9 06/18/2015   CREATININE 0.89 06/18/2015   on valsartan. - last set of lipids: Lab Results  Component Value Date   CHOL 231* 06/18/2015   HDL 43.60 06/18/2015   LDLCALC 151* 06/18/2015   LDLDIRECT 201.0 03/04/2015   TRIG 179.0* 06/18/2015   CHOLHDL 5 06/18/2015   on Zetia, WelChol, Zocor. - last eye exam was in 02/2015. + DR. + cataracts, glaucoma.  - + numbness and  tingling in his feet.  Pt has FH of DM in mother, father's family.   ROS: Constitutional: no weight gain/loss, no fatigue, no subjective hyperthermia/hypothermia Eyes: no blurry vision, no xerophthalmia ENT: no sore throat, no nodules palpated in throat, no dysphagia/odynophagia, no hoarseness Cardiovascular: no CP/SOB/palpitations/leg swelling Respiratory: no cough/SOB Gastrointestinal: no N/V/D/C Musculoskeletal: no muscle/joint aches Skin: no rashes Neurological: no tremors/numbness/tingling/dizziness Psychiatric: no depression/anxiety  Past Medical History  Diagnosis Date  . Hypertension   . Diabetes mellitus     type II  . Tubulovillous adenoma   . Hypercholesterolemia   . Cancer     h/o stomach CA   Past Surgical History  Procedure Laterality Date  . Knee surgery  2004    left  . Colonoscopy  02/2013    polyp removal(mult.)  . Proctoscopy  02/03/2012    Procedure: PROCTOSCOPY;  Surgeon: Adin Hector, MD;  Location: [facility];  Service: General;;  . Bladder neck reconstruction  02/03/2012    Procedure: BLADDER NECK REPAIR;  Surgeon: Adin Hector, MD;  Location: [facility];  Service: General;;  . Stomach surgery  02/2012   Social History   Social History  . Marital Status: Widowed    Spouse Name: N/A  . Number of Children: 1  . Years of Education: 1  Occupational History  . retired     retired   Social History Main Topics  . Smoking status: Never Smoker   . Smokeless tobacco: Never Used  . Alcohol Use: No  . Drug Use: No  . Sexual Activity: Not Currently   Other Topics Concern  . Not on file   Social History Narrative   Patient lives at home alone.. Patient is retired. Patient has high school education.   Right handed.- Both   Caffeine- Coffee and soda   Current Outpatient Prescriptions on File Prior to Visit  Medication Sig Dispense Refill  . canagliflozin (INVOKANA) 300 MG TABS tablet Take 300 mg by mouth daily before breakfast. 90 tablet 3  .  celecoxib (CELEBREX) 200 MG capsule Take 1 capsule (200 mg total) by mouth every morning. 90 capsule 3  . Colesevelam HCl 3.75 G PACK Take 1 packet by mouth daily. 90 each 3  . cyclobenzaprine (FLEXERIL) 10 MG tablet Take 1 tablet (10 mg total) by mouth 2 (two) times daily as needed for muscle spasms. 10 tablet 0  . dutasteride (AVODART) 0.5 MG capsule Take 1 capsule (0.5 mg total) by mouth daily. 90 capsule 2  . ezetimibe (ZETIA) 10 MG tablet Take 1 tablet (10 mg total) by mouth daily. 90 tablet 3  . glimepiride (AMARYL) 2 MG tablet TAKE 1 TABLET DAILY BEFORE BREAKFAST 90 tablet 0  . glucose blood (TRUETEST TEST) test strip Test up to three times daily 100 each 12  . rivaroxaban (XARELTO) 20 MG TABS tablet Take 1 tablet (20 mg total) by mouth daily with supper. 30 tablet 11  . sertraline (ZOLOFT) 50 MG tablet Take 1 tablet (50 mg total) by mouth daily. 30 tablet 11  . simvastatin (ZOCOR) 40 MG tablet Take 1 tablet (40 mg total) by mouth every evening. 90 tablet 3  . sitaGLIPtin (JANUVIA) 100 MG tablet Take 1 tablet (100 mg total) by mouth daily. 90 tablet 3  . tadalafil (CIALIS) 5 MG tablet Take 1 tablet (5 mg total) by mouth daily as needed for erectile dysfunction. 30 tablet 11  . traMADol (ULTRAM) 50 MG tablet Take 1 tablet (50 mg total) by mouth every 8 (eight) hours as needed. 60 tablet 5  . valsartan-hydrochlorothiazide (DIOVAN-HCT) 320-12.5 MG per tablet Take 1 tablet by mouth daily. 90 tablet 3  . Insulin Glargine (TOUJEO SOLOSTAR) 300 UNIT/ML SOPN Inject 50 Units into the skin daily. (Patient not taking: Reported on 06/27/2015) 1.5 mL 11   No current facility-administered medications on file prior to visit.   Allergies  Allergen Reactions  . Metformin And Related     diarrhea  . Codeine Rash    All over the body   Family History  Problem Relation Age of Onset  . Hypertension Mother   . Diabetes Mother    PE: BP 132/80 mmHg  Pulse 64  Temp(Src) 97.5 F (36.4 C) (Oral)  Resp  12  Wt 193 lb (87.544 kg)  SpO2 98% Body mass index is 26.17 kg/(m^2). Wt Readings from Last 3 Encounters:  06/27/15 193 lb (87.544 kg)  06/18/15 193 lb (87.544 kg)  03/04/15 204 lb (92.534 kg)   Constitutional: normal weight, in NAD Eyes: PERRLA, EOMI, no exophthalmos ENT: moist mucous membranes, no thyromegaly, no cervical lymphadenopathy Cardiovascular: RRR, No MRG Respiratory: CTA B Gastrointestinal: abdomen soft, NT, ND, BS+ Musculoskeletal: no deformities, strength intact in all 4 Skin: moist, warm, no rashes Neurological: no tremor with outstretched hands, DTR normal in all 4  ASSESSMENT: 1. DM2, insulin-independent, uncontrolled, with complications - DR with macular degeneration  PLAN:  1. Patient with long-standing, uncontrolled diabetes, on oral antidiabetic regimen. His last hemoglobin A1c was very high but he mentions that he was off the medicines for several days before this check. I suspect that he has been off for longer than that. He has problems affording his medicines, especially Januvia, which he had stopped. He also did not pick up the Toujeo from the pharmacy however, he mentions that he did not know that he had to start this... - At today's visit, he does not bring a sugar log or his glucometer. He also did not check his sugars in the last week after he restarted Amaryl and iNVOKANA. Therefore, it almost impossible to make changes in his regimen, but since his sugars in a.m. appear to be higher than the rest of the day, I suspect that the culprit for this is hyperglycemia after dinner. Will increase the Amaryl to 2 mg twice a day before meals. I also gave him a discount card for [facility]. - I suggested to:  Patient Instructions  Please continue Invokana 300 mg in am. Please increased Amaryl 2 mg 2x a day, before breakfast and dinner.  Check sugars 2x a day, rotating check times.  Please let me know if the sugars are consistently <70 or >200.  Please return in 1  month with your sugar log.   Please schedule an appt with Antonieta Iba with nutrition.  - Strongly advised him to start checking sugars at different times of the day - check 2 times a day, rotating checks - given sugar log and advised how to fill it and to bring it at next appt  - given foot care handout and explained the principles  - given instructions for hypoglycemia management "15-15 rule"  - advised for yearly eye exams >> he is up-to-date - He already had the flu shot this season - He agrees with the referral to nutrition  - Return to clinic in 1 mo with sugar log

## 2015-07-04 ENCOUNTER — Other Ambulatory Visit: Payer: Self-pay

## 2015-07-04 MED ORDER — GLIMEPIRIDE 2 MG PO TABS: 2.0000 mg | ORAL_TABLET | Freq: Every day | ORAL | Status: DC

## 2015-07-07 ENCOUNTER — Telehealth: Payer: Self-pay

## 2015-07-07 NOTE — Telephone Encounter (Signed)
Call to introduce AWV and agreed to come in this week prior to CPE; Apt made for 10/05 at 2pm

## 2015-07-09 ENCOUNTER — Telehealth (HOSPITAL_COMMUNITY): Payer: Self-pay

## 2015-07-09 ENCOUNTER — Ambulatory Visit: Payer: Commercial Managed Care - HMO

## 2015-07-09 NOTE — Telephone Encounter (Signed)
Encounter complete. 

## 2015-07-11 ENCOUNTER — Telehealth (HOSPITAL_COMMUNITY): Payer: Self-pay | Admitting: Radiology

## 2015-07-11 NOTE — Telephone Encounter (Signed)
Encounter complete. 

## 2015-07-14 ENCOUNTER — Ambulatory Visit (INDEPENDENT_AMBULATORY_CARE_PROVIDER_SITE_OTHER): Payer: Commercial Managed Care - HMO | Admitting: Internal Medicine

## 2015-07-14 ENCOUNTER — Other Ambulatory Visit (INDEPENDENT_AMBULATORY_CARE_PROVIDER_SITE_OTHER): Payer: Commercial Managed Care - HMO

## 2015-07-14 ENCOUNTER — Encounter: Payer: Self-pay | Admitting: Internal Medicine

## 2015-07-14 VITALS — BP 120/88 | HR 79 | Temp 98.2°F | Resp 16 | Ht 72.0 in | Wt 192.0 lb

## 2015-07-14 DIAGNOSIS — M150 Primary generalized (osteo)arthritis: Secondary | ICD-10-CM | POA: Diagnosis not present

## 2015-07-14 DIAGNOSIS — Z794 Long term (current) use of insulin: Secondary | ICD-10-CM | POA: Diagnosis not present

## 2015-07-14 DIAGNOSIS — M159 Polyosteoarthritis, unspecified: Secondary | ICD-10-CM

## 2015-07-14 DIAGNOSIS — R0609 Other forms of dyspnea: Secondary | ICD-10-CM

## 2015-07-14 DIAGNOSIS — N401 Enlarged prostate with lower urinary tract symptoms: Secondary | ICD-10-CM

## 2015-07-14 DIAGNOSIS — E11319 Type 2 diabetes mellitus with unspecified diabetic retinopathy without macular edema: Secondary | ICD-10-CM | POA: Diagnosis not present

## 2015-07-14 DIAGNOSIS — M170 Bilateral primary osteoarthritis of knee: Secondary | ICD-10-CM

## 2015-07-14 DIAGNOSIS — R0600 Dyspnea, unspecified: Secondary | ICD-10-CM

## 2015-07-14 DIAGNOSIS — R3916 Straining to void: Secondary | ICD-10-CM

## 2015-07-14 DIAGNOSIS — E785 Hyperlipidemia, unspecified: Secondary | ICD-10-CM | POA: Diagnosis not present

## 2015-07-14 DIAGNOSIS — M8949 Other hypertrophic osteoarthropathy, multiple sites: Secondary | ICD-10-CM

## 2015-07-14 DIAGNOSIS — I10 Essential (primary) hypertension: Secondary | ICD-10-CM | POA: Diagnosis not present

## 2015-07-14 DIAGNOSIS — Z0000 Encounter for general adult medical examination without abnormal findings: Secondary | ICD-10-CM

## 2015-07-14 LAB — GLUCOSE, POCT (MANUAL RESULT ENTRY): POC Glucose: 233 mg/dl — AB (ref 70–99)

## 2015-07-14 LAB — PSA: PSA: 3.06 ng/mL (ref 0.10–4.00)

## 2015-07-14 LAB — FECAL OCCULT BLOOD, GUAIAC: FECAL OCCULT BLD: NEGATIVE

## 2015-07-14 MED ORDER — INSULIN GLARGINE 300 UNIT/ML ~~LOC~~ SOPN: 30.0000 [IU] | PEN_INJECTOR | Freq: Every day | SUBCUTANEOUS | Status: DC

## 2015-07-14 MED ORDER — SIMVASTATIN 40 MG PO TABS: 40.0000 mg | ORAL_TABLET | Freq: Every evening | ORAL | Status: DC

## 2015-07-14 MED ORDER — GLIMEPIRIDE 2 MG PO TABS: 2.0000 mg | ORAL_TABLET | Freq: Every day | ORAL | Status: DC

## 2015-07-14 MED ORDER — CELECOXIB 200 MG PO CAPS: 200.0000 mg | ORAL_CAPSULE | Freq: Every morning | ORAL | Status: DC

## 2015-07-14 MED ORDER — INSULIN PEN NEEDLE 32G X 6 MM MISC: 1.0000 | Freq: Every day | Status: DC

## 2015-07-14 MED ORDER — CANAGLIFLOZIN 300 MG PO TABS: 300.0000 mg | ORAL_TABLET | Freq: Every day | ORAL | Status: DC

## 2015-07-14 NOTE — Progress Notes (Signed)
Pre visit review using our clinic review tool, if applicable. No additional management support is needed unless otherwise documented below in the visit note. 

## 2015-07-14 NOTE — Assessment & Plan Note (Signed)

## 2015-07-14 NOTE — Patient Instructions (Signed)

## 2015-07-14 NOTE — Progress Notes (Signed)
Subjective:  Patient ID: Charles Parker, male    DOB: Nov 13, 1945  Age: 69 y.o. MRN: [id number]  CC: Annual Exam and Diabetes   HPI Charles Parker presents for a CPX and f/up on DM2 and BPH. He struggles with compliance with meds and has a low intellect which makes it very difficult to control his medical concerns.   Outpatient Prescriptions Prior to Visit  Medication Sig Dispense Refill  . Colesevelam HCl 3.75 G PACK Take 1 packet by mouth daily. 90 each 3  . cyclobenzaprine (FLEXERIL) 10 MG tablet Take 1 tablet (10 mg total) by mouth 2 (two) times daily as needed for muscle spasms. 10 tablet 0  . dutasteride (AVODART) 0.5 MG capsule Take 1 capsule (0.5 mg total) by mouth daily. 90 capsule 2  . ezetimibe (ZETIA) 10 MG tablet Take 1 tablet (10 mg total) by mouth daily. 90 tablet 3  . glucose blood (TRUETEST TEST) test strip Test up to three times daily 100 each 12  . rivaroxaban (XARELTO) 20 MG TABS tablet Take 1 tablet (20 mg total) by mouth daily with supper. 30 tablet 11  . sertraline (ZOLOFT) 50 MG tablet Take 1 tablet (50 mg total) by mouth daily. 30 tablet 11  . sitaGLIPtin (JANUVIA) 100 MG tablet Take 1 tablet (100 mg total) by mouth daily. 90 tablet 3  . tadalafil (CIALIS) 5 MG tablet Take 1 tablet (5 mg total) by mouth daily as needed for erectile dysfunction. 30 tablet 11  . traMADol (ULTRAM) 50 MG tablet Take 1 tablet (50 mg total) by mouth every 8 (eight) hours as needed. 60 tablet 5  . valsartan-hydrochlorothiazide (DIOVAN-HCT) 320-12.5 MG per tablet Take 1 tablet by mouth daily. 90 tablet 3  . canagliflozin (INVOKANA) 300 MG TABS tablet Take 300 mg by mouth daily before breakfast. 90 tablet 3  . celecoxib (CELEBREX) 200 MG capsule Take 1 capsule (200 mg total) by mouth every morning. 90 capsule 3  . glimepiride (AMARYL) 2 MG tablet Take 1 tablet (2 mg total) by mouth daily before breakfast. And 1 tablet (2 mg) before dinner. 120 tablet 2  . simvastatin (ZOCOR) 40 MG tablet  Take 1 tablet (40 mg total) by mouth every evening. 90 tablet 3   No facility-administered medications prior to visit.    ROS Review of Systems  Constitutional: Negative.  Negative for fever, chills, diaphoresis, appetite change and fatigue.  HENT: Negative.   Eyes: Negative.   Respiratory: Negative.  Negative for cough, choking, chest tightness, shortness of breath and stridor.   Cardiovascular: Negative.  Negative for chest pain and leg swelling.  Gastrointestinal: Negative.  Negative for nausea, vomiting, abdominal pain, diarrhea, constipation and blood in stool.  Endocrine: Positive for polydipsia and polyuria. Negative for polyphagia.  Genitourinary: Negative.  Negative for dysuria, urgency, frequency, flank pain, enuresis, difficulty urinating and testicular pain.  Musculoskeletal: Positive for arthralgias. Negative for myalgias, back pain and neck pain.  Skin: Negative.  Negative for rash.  Allergic/Immunologic: Negative.   Neurological: Negative.  Negative for dizziness, syncope, speech difficulty, weakness, light-headedness and headaches.  Hematological: Negative.  Negative for adenopathy. Does not bruise/bleed easily.  Psychiatric/Behavioral: Negative.     Objective:  BP 120/88 mmHg  Pulse 79  Temp(Src) 98.2 F (36.8 C) (Oral)  Resp 16  Ht 6' (1.829 m)  Wt 192 lb (87.091 kg)  BMI 26.03 kg/m2  SpO2 97%  BP Readings from Last 3 Encounters:  07/14/15 120/88  06/27/15 132/80  06/18/15 142/86  Wt Readings from Last 3 Encounters:  07/14/15 192 lb (87.091 kg)  06/27/15 193 lb (87.544 kg)  06/18/15 193 lb (87.544 kg)    Physical Exam  Constitutional: He is oriented to person, place, and time. No distress.  HENT:  Head: Normocephalic and atraumatic.  Mouth/Throat: Oropharynx is clear and moist. No oropharyngeal exudate.  Eyes: Conjunctivae are normal. Right eye exhibits no discharge. Left eye exhibits no discharge. No scleral icterus.  Neck: Normal range of  motion. Neck supple. No JVD present. No tracheal deviation present. No thyromegaly present.  Cardiovascular: Normal rate, regular rhythm, normal heart sounds and intact distal pulses.  Exam reveals no gallop and no friction rub.   No murmur heard. Pulmonary/Chest: Effort normal and breath sounds normal. No stridor. No respiratory distress. He has no wheezes. He has no rales. He exhibits no tenderness.  Abdominal: Soft. Bowel sounds are normal. He exhibits no distension and no mass. There is no tenderness. There is no rebound and no guarding. Hernia confirmed negative in the right inguinal area and confirmed negative in the left inguinal area.  Genitourinary: Rectum normal, testes normal and penis normal. Rectal exam shows no external hemorrhoid, no internal hemorrhoid, no fissure, no mass, no tenderness and anal tone normal. Guaiac negative stool. Prostate is enlarged (1+ smooth symm BPH). Prostate is not tender. Right testis shows no mass, no swelling and no tenderness. Right testis is descended. Left testis shows no mass, no swelling and no tenderness. Left testis is descended. Circumcised. No penile erythema or penile tenderness. No discharge found.  Musculoskeletal: Normal range of motion. He exhibits no edema or tenderness.  Lymphadenopathy:    He has no cervical adenopathy.       Right: No inguinal adenopathy present.       Left: No inguinal adenopathy present.  Neurological: He is oriented to person, place, and time.  Skin: Skin is warm and dry. No rash noted. He is not diaphoretic. No erythema. No pallor.  Psychiatric: He has a normal mood and affect. His behavior is normal. Judgment and thought content normal.  Vitals reviewed.   Lab Results  Component Value Date   WBC 4.3 03/04/2015   HGB 15.1 03/04/2015   HCT 44.0 03/04/2015   PLT 174.0 03/04/2015   GLUCOSE 408* 06/18/2015   CHOL 231* 06/18/2015   TRIG 179.0* 06/18/2015   HDL 43.60 06/18/2015   LDLDIRECT 201.0 03/04/2015    LDLCALC 151* 06/18/2015   ALT 36 07/10/2014   AST 36 07/10/2014   NA 134* 06/18/2015   K 3.8 06/18/2015   CL 100 06/18/2015   CREATININE 0.89 06/18/2015   BUN 9 06/18/2015   CO2 26 06/18/2015   TSH 1.83 11/01/2014   PSA 3.06 07/14/2015   INR 2.1* 05/26/2009   HGBA1C 13.6* 06/18/2015   MICROALBUR <0.7 11/01/2014    Ct Abdomen Pelvis W Contrast  12/26/2013   CLINICAL DATA:  Left lower quadrant tenderness, postprandial pain.  EXAM: CT ABDOMEN AND PELVIS WITH CONTRAST  TECHNIQUE: Multidetector CT imaging of the abdomen and pelvis was performed using the standard protocol following bolus administration of intravenous contrast.  CONTRAST:  154mL OMNIPAQUE IOHEXOL 300 MG/ML  SOLN  COMPARISON:  02/22/2012  FINDINGS: Lung bases are clear.  No effusions.  Heart is normal size.  Small scattered hypodensities in the liver are stable since prior study compatible with small cysts. Multiple bilateral renal cysts are again noted and unchanged, the largest off the superior pole of the right kidney measuring 7.1  cm.  Appendix is visualized and is normal. Stomach and small bowel are unremarkable. Moderate distention of the colon with stool and gas. No inflammatory process. Scattered sigmoid diverticula. No active diverticulitis. Postsurgical changes noted in the rectum.  Prostate is enlarged. Urinary bladder is unremarkable. No free fluid, free air or adenopathy. Aorta and iliac vessels are normal caliber.  No acute bony abnormality.  IMPRESSION: Moderate distention of the colon with gas and stool. Scattered sigmoid diverticula. No active diverticulitis.  Stable hepatic and renal cysts.  Prostate enlargement.  No acute findings in the abdomen or pelvis.   Electronically Signed   By: Rolm Baptise M.D.   On: 12/26/2013 17:02    Assessment & Plan:   Babe was seen today for annual exam and diabetes.  Diagnoses and all orders for this visit:  Primary osteoarthritis involving multiple joints -     celecoxib  (CELEBREX) 200 MG capsule; Take 1 capsule (200 mg total) by mouth every morning.  Hyperlipidemia with target LDL less than 70- a statin has been started but it is too early to recheck this FLP -     simvastatin (ZOCOR) 40 MG tablet; Take 1 tablet (40 mg total) by mouth every evening.  Essential hypertension- his BP is well controlled, recent lytes and renal function have been normal  Type 2 diabetes mellitus with retinopathy of both eyes, with long-term current use of insulin, macular edema presence unspecified, unspecified retinopathy severity ([place])- his blood sugars are not well controlled and he has poly's so I have asked him to start basal insulin, he was given his first dose today in the office and he was educated on the use of the insulin pen. -     glimepiride (AMARYL) 2 MG tablet; Take 1 tablet (2 mg total) by mouth daily before breakfast. And 1 tablet (2 mg) before dinner. -     simvastatin (ZOCOR) 40 MG tablet; Take 1 tablet (40 mg total) by mouth every evening. -     canagliflozin (INVOKANA) 300 MG TABS tablet; Take 300 mg by mouth daily before breakfast. -     Insulin Glargine (TOUJEO SOLOSTAR) 300 UNIT/ML SOPN; Inject 30 Units into the skin daily. -     Insulin Pen Needle (NOVOFINE) 32G X 6 MM MISC; 1 Act by Does not apply route daily. Use QD -     POCT Glucose (CBG)  Benign prostatic hyperplasia (BPH) with straining on urination- his PSA is trending down and his exam is not suspicious for cancer, he does not appear to be taking avodart and he has no symptoms that need to be treated -     PSA; Future  Primary osteoarthritis of both knees -     celecoxib (CELEBREX) 200 MG capsule; Take 1 capsule (200 mg total) by mouth every morning.   I am having Mr. Kottke start on Insulin Glargine and Insulin Pen Needle. I am also having him maintain his sertraline, cyclobenzaprine, Colesevelam HCl, dutasteride, tadalafil, traMADol, valsartan-hydrochlorothiazide, ezetimibe, sitaGLIPtin, glucose  blood, rivaroxaban, celecoxib, glimepiride, simvastatin, and canagliflozin.  Meds ordered this encounter  Medications  . celecoxib (CELEBREX) 200 MG capsule    Sig: Take 1 capsule (200 mg total) by mouth every morning.    Dispense:  90 capsule    Refill:  3  . glimepiride (AMARYL) 2 MG tablet    Sig: Take 1 tablet (2 mg total) by mouth daily before breakfast. And 1 tablet (2 mg) before dinner.    Dispense:  120 tablet  Refill:  2  . simvastatin (ZOCOR) 40 MG tablet    Sig: Take 1 tablet (40 mg total) by mouth every evening.    Dispense:  90 tablet    Refill:  3  . canagliflozin (INVOKANA) 300 MG TABS tablet    Sig: Take 300 mg by mouth daily before breakfast.    Dispense:  90 tablet    Refill:  3  . Insulin Glargine (TOUJEO SOLOSTAR) 300 UNIT/ML SOPN    Sig: Inject 30 Units into the skin daily.    Dispense:  1.5 mL    Refill:  11  . Insulin Pen Needle (NOVOFINE) 32G X 6 MM MISC    Sig: 1 Act by Does not apply route daily. Use QD    Dispense:  100 each    Refill:  3   See AVS for instructions about healthy living and anticipatory guidance.  Follow-up: Return in about 3 months (around 10/14/2015).  Scarlette Calico, MD

## 2015-07-15 ENCOUNTER — Other Ambulatory Visit: Payer: Self-pay

## 2015-07-15 NOTE — Patient Outreach (Signed)
[facility] on [address]  Patient reports glucose levels of 130-160s fasting.  Dinner 140-150s since starting on his new medication regimen about 2 weeks ago ([place] evening meal) Patient is currently attending sessions at [facility], has appointment on October 13.    Initial home visit on July 24, 2015

## 2015-07-16 ENCOUNTER — Telehealth: Payer: Self-pay | Admitting: *Deleted

## 2015-07-16 ENCOUNTER — Ambulatory Visit (HOSPITAL_COMMUNITY)
Admission: RE | Admit: 2015-07-16 | Discharge: 2015-07-16 | Disposition: A | Discharge status: Home / Self Care | Payer: Commercial Managed Care - HMO | Source: Ambulatory Visit | Attending: Internal Medicine | Admitting: Internal Medicine

## 2015-07-16 DIAGNOSIS — E7800 Pure hypercholesterolemia, unspecified: Secondary | ICD-10-CM | POA: Diagnosis not present

## 2015-07-16 DIAGNOSIS — I10 Essential (primary) hypertension: Secondary | ICD-10-CM | POA: Insufficient documentation

## 2015-07-16 DIAGNOSIS — R0600 Dyspnea, unspecified: Secondary | ICD-10-CM

## 2015-07-16 DIAGNOSIS — R0609 Other forms of dyspnea: Secondary | ICD-10-CM | POA: Diagnosis not present

## 2015-07-16 DIAGNOSIS — E119 Type 2 diabetes mellitus without complications: Secondary | ICD-10-CM | POA: Insufficient documentation

## 2015-07-16 DIAGNOSIS — M7989 Other specified soft tissue disorders: Secondary | ICD-10-CM | POA: Insufficient documentation

## 2015-07-16 LAB — EXERCISE TOLERANCE TEST
CHL RATE OF PERCEIVED EXERTION: 16
CSEPEDS: 11 s
CSEPEW: 7 METS
CSEPPHR: 134 {beats}/min
Exercise duration (min): 5 min
MPHR: 151 {beats}/min
Percent HR: 88 %
Rest HR: 80 {beats}/min

## 2015-07-16 MED ORDER — TRUEPLUS LANCETS 33G MISC: Status: DC

## 2015-07-16 MED ORDER — BD SWAB SINGLE USE REGULAR PADS: MEDICATED_PAD | Status: DC

## 2015-07-16 MED ORDER — GLUCOSE BLOOD VI STRP: 1.0000 | ORAL_STRIP | Freq: Two times a day (BID) | Status: DC

## 2015-07-16 MED ORDER — TRUE METRIX METER W/DEVICE KIT: PACK | Status: DC

## 2015-07-16 NOTE — Telephone Encounter (Signed)
Left msg on triage stating pt is requesting Diabetic Meter w/supplies TrueMetric Meter, strips, lancets, & BD single use alcohol pads. Sent electronically to mail order...[name]

## 2015-07-17 ENCOUNTER — Encounter: Payer: Self-pay | Admitting: Dietician

## 2015-07-17 ENCOUNTER — Encounter: Payer: Commercial Managed Care - HMO | Attending: Internal Medicine | Admitting: Dietician

## 2015-07-17 VITALS — Wt 193.0 lb

## 2015-07-17 DIAGNOSIS — Z713 Dietary counseling and surveillance: Secondary | ICD-10-CM | POA: Insufficient documentation

## 2015-07-17 DIAGNOSIS — E11319 Type 2 diabetes mellitus with unspecified diabetic retinopathy without macular edema: Secondary | ICD-10-CM

## 2015-07-17 DIAGNOSIS — Z794 Long term (current) use of insulin: Secondary | ICD-10-CM | POA: Diagnosis not present

## 2015-07-17 NOTE — Addendum Note (Signed)
Addended by: Janith Lima on: 07/17/2015 06:58 AM   Modules accepted: Miquel Dunn

## 2015-07-17 NOTE — Patient Instructions (Signed)
Avoid drinking anything with sugar.  Avoid juice.   Avoid added sugar. Continue your exercise. Have more non starchy vegetables. Aim for 3-4 carb choices per meal. Aim for 0-2 carb choices per snack when hungry. Continue to take your medication as prescribed.

## 2015-07-17 NOTE — Progress Notes (Signed)
Diabetes Self-Management Education  Visit Type: First/Initial  Appt. Start Time: 1300 Appt. End Time: 1430  07/17/2015  Mr. Chord Takahashi, identified by name and date of birth, is a 69 y.o. male with a diagnosis of Diabetes: Type 2.   Patient is here alone.  He goes to the food bank for some of his food.  He does eat out at times.  Cooks very simply or relies on TV dinners.  He is retired form [employer].  He does walk daily and bowls once a week. Cholesterol 231, Trig 179, HDL 44, LDL 151.  HgbA1C 13.6% 06/18/15 increased form 9.5% 03/04/15.  He has lost from 206 lbs to 193 lbs in the past year.  Patient was off of his medications for some time prior to that check.  He reports compliance with his medications today but cannot remember their names.    ASSESSMENT  Weight 193 lb (87.544 kg). Body mass index is 26.17 kg/(m^2).      Diabetes Self-Management Education - 07/17/15 1320    Visit Information   Visit Type First/Initial   Initial Visit   Diabetes Type Type 2   Are you currently following a meal plan? No   Are you taking your medications as prescribed? Yes   Date Diagnosed 2006   Health Coping   How would you rate your overall health? Good   Psychosocial Assessment   Patient Belief/Attitude about Diabetes Motivated to manage diabetes   Self-care barriers None   Self-management support Doctor's office   Other persons present Patient   Patient Concerns Nutrition/Meal planning;Glycemic Control   Preferred Learning Style No preference indicated   Learning Readiness Ready   How often do you need to have someone help you when you read instructions, pamphlets, or other written materials from your doctor or pharmacy? 1 - Never   What is the last grade level you completed in school? HS   Complications   Last HgB A1C per patient/outside source 13.6 %  06/2015   How often do you check your blood sugar? 1-2 times/day   Fasting Blood glucose range (mg/dL) 70-129;130-179   Number of  hypoglycemic episodes per month 0   Number of hyperglycemic episodes per week 0   Have you had a dilated eye exam in the past 12 months? Yes   Have you had a dental exam in the past 12 months? No  dentures   Are you checking your feet? Yes   How many days per week are you checking your feet? 4   Dietary Intake   Breakfast sausage biscuit and hashbrown from [employer] OR egg McMuffin OR plain cereal and 2% milk  8   Snack (morning) NABS occasionally   Lunch lunch meat sandwhich on [employer] bread, mayo or mustard, grapes OR [facility] of [facility] on Tuesday (Meatloaf, cabbage, pinto's, candied yams, cornbread)  12   Snack (afternoon) NABS   Dinner TV dinner ([place]), or Fish sandwich and onion rings at [employer] or 6"steak or tuna sub at [employer] (evening) NABS   Beverage(s) water, OJ, apple juice, Occasional sweet tea, occasional regular kool aide   Exercise   Exercise Type Light (walking / raking leaves)   How many days per week to you exercise? 7   How many minutes per day do you exercise? 45  walks 1-2 miles per day, bowls once per week   Total minutes per week of exercise 315   Patient Education   Previous Diabetes Education  No   Disease state  Definition of diabetes, type 1 and 2, and the diagnosis of diabetes   Nutrition management  Role of diet in the treatment of diabetes and the relationship between the three main macronutrients and blood glucose level;Food label reading, portion sizes and measuring food.;Meal options for control of blood glucose level and chronic complications.   Physical activity and exercise  Role of exercise on diabetes management, blood pressure control and cardiac health.   Monitoring Identified appropriate SMBG and/or A1C goals.;Daily foot exams;Yearly dilated eye exam   Chronic complications Relationship between chronic complications and blood glucose control   Psychosocial adjustment Role of stress on diabetes   Individualized Goals (developed by  patient)   Nutrition General guidelines for healthy choices and portions discussed   Physical Activity Exercise 5-7 days per week;45 minutes per day   Monitoring  test my blood glucose as discussed   Reducing Risk do foot checks daily   Outcomes   Expected Outcomes Demonstrated interest in learning. Expect positive outcomes   Future DMSE 4-6 wks   Program Status Completed      Individualized Plan for Diabetes Self-Management Training:   Learning Objective:  Patient will have a greater understanding of diabetes self-management. Patient education plan is to attend individual and/or group sessions per assessed needs and concerns.   Plan:   Patient Instructions  Avoid drinking anything with sugar.  Avoid juice.   Avoid added sugar. Continue your exercise. Have more non starchy vegetables. Aim for 3-4 carb choices per meal. Aim for 0-2 carb choices per snack when hungry. Continue to take your medication as prescribed.   Expected Outcomes:  Demonstrated interest in learning. Expect positive outcomes  Education material provided: Living Well with Diabetes, Food label handouts, A1C conversion sheet, Meal plan card, My Plate and Snack sheet  If problems or questions, patient to contact team via:  Phone  Future DSME appointment: 2 months

## 2015-07-24 ENCOUNTER — Other Ambulatory Visit: Payer: Self-pay

## 2015-07-24 DIAGNOSIS — E1169 Type 2 diabetes mellitus with other specified complication: Secondary | ICD-10-CM

## 2015-07-24 NOTE — Patient Outreach (Addendum)
[facility]) Care Management  07/24/2015  SATURNINO LIEW 07/09/1946 [id number]   Request from Erenest Rasher, RN to assign Pharmacy and SW, assigned Harlow Asa, PharmD and [name], LCSW.  Thanks, Ronnell Freshwater. [place], [place] Assistant Phone: [phone] Fax: [phone]

## 2015-07-24 NOTE — Patient Outreach (Addendum)
Charles Parker) Care Management  07/24/2015  Charles Parker 1946-03-29 [id number]   Referral made to [place] for assistance with  Obtaining medications and education. Patient states he goes to food bank on [address] sometimes and [place] on [address].  Patient states you have to go to food banks early to get good choices of food.  He states he is usually given canned foods, not fresh.   Completed application and mailed for EBT and Medicaid.  Patient advised that medicaid application takes at least 45 days for processing.  [facility] Information Packet reviewed with patient at beginning of visit.

## 2015-07-29 ENCOUNTER — Ambulatory Visit (INDEPENDENT_AMBULATORY_CARE_PROVIDER_SITE_OTHER): Payer: Commercial Managed Care - HMO | Admitting: Internal Medicine

## 2015-07-29 ENCOUNTER — Encounter: Payer: Self-pay | Admitting: Internal Medicine

## 2015-07-29 VITALS — BP 110/62 | HR 82 | Temp 98.1°F | Resp 12 | Wt 194.6 lb

## 2015-07-29 DIAGNOSIS — E11319 Type 2 diabetes mellitus with unspecified diabetic retinopathy without macular edema: Secondary | ICD-10-CM | POA: Diagnosis not present

## 2015-07-29 DIAGNOSIS — Z794 Long term (current) use of insulin: Secondary | ICD-10-CM

## 2015-07-29 NOTE — Patient Instructions (Signed)
Please continue: - Invokana 300 mg in am. - Amaryl 2 mg 2x a day, before breakfast and dinner.  Check sugars 2x a day, rotating check times.  Please let me know if the sugars are consistently <70 or >200.  Please return in 2 month with your sugar log.

## 2015-07-29 NOTE — Progress Notes (Signed)
Patient ID: Charles Parker, male   DOB: 1945/11/11, 69 y.o.   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 Last visit 1 mo ago.   Last hemoglobin A1c was: Lab Results  Component Value Date   HGBA1C 13.6* 06/18/2015   HGBA1C 9.5* 03/04/2015   HGBA1C 9.6* 11/01/2014   before last hemoglobin A1c, he was out of his diabetes medicines x 4 days.  Pt is on a regimen of (he is not sure what exactly he is taking, he remembers he takes 2 meds): - Amaryl 2 mg in a.m. and 2 mg 2x  aday - Invokana 300 mg in a.m. ($31) ? Colesevelam 3.75 mg daily She was started on Toujeo 50 units at bedtime - added recently by PCP after last hemoglobin A1c returned high >> pt tells me he was not aware of this >> did not start.  He stopped Januvia 100 mg in a.m. - expensive  Pt checks his sugars 0-1 a day and they are (no log, no meter) - did not check it in a week (after he restarted the meds): - am: 160-201 >> 52x1, 89-133 - 2h after b'fast: n/c - before lunch: n/c - 2h after lunch: n/c - before dinner: 135-140 >> 92-145 - 2h after dinner: 145 >> n/c  - bedtime: n/c - nighttime: n/c No lows. Lowest sugar was 135 >> 52x1 (? What happened); he has hypoglycemia awareness at 90.  Highest sugar was 303 >> 150.  Glucometer: TruTest  Pt's meals are: - Breakfast: biscuit  - Lunch: [place] meat - Dinner: Hungry man or PB and jelly or green beans, kale - Snacks: banana, cake, peppermint candy  - no CKD, last BUN/creatinine:  Lab Results  Component Value Date   BUN 9 06/18/2015   CREATININE 0.89 06/18/2015   on valsartan. - last set of lipids: Lab Results  Component Value Date   CHOL 231* 06/18/2015   HDL 43.60 06/18/2015   LDLCALC 151* 06/18/2015   LDLDIRECT 201.0 03/04/2015   TRIG 179.0* 06/18/2015   CHOLHDL 5 06/18/2015   on Zetia, WelChol, Zocor. -  last eye exam was in 02/2015. + DR. + cataracts, glaucoma.  - + numbness and tingling in his feet.  ROS: Constitutional: no weight gain/loss, no fatigue, no subjective hyperthermia/hypothermia Eyes: no blurry vision, no xerophthalmia ENT: no sore throat, no nodules palpated in throat, no dysphagia/odynophagia, no hoarseness Cardiovascular: no CP/SOB/palpitations/leg swelling Respiratory: no cough/SOB Gastrointestinal: no N/V/D/C Musculoskeletal: no muscle/joint aches Skin: no rashes Neurological: no tremors/numbness/tingling/dizziness  I reviewed pt's medications, allergies, PMH, social hx, family hx, and changes were documented in the history of present illness. Otherwise, unchanged from my initial visit note.  Past Medical History  Diagnosis Date  . Hypertension   . Diabetes mellitus     type II  . Tubulovillous adenoma   . Hypercholesterolemia   . Cancer [facility])     h/o stomach CA   Past Surgical History  Procedure Laterality Date  . Knee surgery  2004    left  . Colonoscopy  02/2013    polyp removal(mult.)  . Proctoscopy  02/03/2012    Procedure: PROCTOSCOPY;  Surgeon: Adin Hector, MD;  Location: [facility];  Service: General;;  . Bladder neck reconstruction  02/03/2012    Procedure: BLADDER NECK REPAIR;  Surgeon: Adin Hector, MD;  Location: [facility] ORS;  Service: General;;  .  Stomach surgery  02/2012   Social History   Social History  . Marital Status: Widowed    Spouse Name: N/A  . Number of Children: 1  . Years of Education: 12   Occupational History  . retired     retired   Social History Main Topics  . Smoking status: Never Smoker   . Smokeless tobacco: Never Used  . Alcohol Use: No  . Drug Use: No  . Sexual Activity: Not Currently   Other Topics Concern  . Not on file   Social History Narrative   Patient lives at home alone.. Patient is retired. Patient has high school education.   Right handed.- Both   Caffeine- Coffee and soda   Current Outpatient  Prescriptions on File Prior to Visit  Medication Sig Dispense Refill  . Alcohol Swabs (B-D SINGLE USE SWABS REGULAR) PADS Use as directed Dx E11.9 180 each 3  . Blood Glucose Monitoring Suppl (TRUE METRIX METER) W/DEVICE KIT Use as directed  Dx E11.9 1 kit 0  . canagliflozin (INVOKANA) 300 MG TABS tablet Take 300 mg by mouth daily before breakfast. 90 tablet 3  . celecoxib (CELEBREX) 200 MG capsule Take 1 capsule (200 mg total) by mouth every morning. 90 capsule 3  . Colesevelam HCl 3.75 G PACK Take 1 packet by mouth daily. 90 each 3  . cyclobenzaprine (FLEXERIL) 10 MG tablet Take 1 tablet (10 mg total) by mouth 2 (two) times daily as needed for muscle spasms. 10 tablet 0  . dutasteride (AVODART) 0.5 MG capsule Take 1 capsule (0.5 mg total) by mouth daily. 90 capsule 2  . ezetimibe (ZETIA) 10 MG tablet Take 1 tablet (10 mg total) by mouth daily. 90 tablet 3  . glimepiride (AMARYL) 2 MG tablet Take 1 tablet (2 mg total) by mouth daily before breakfast. And 1 tablet (2 mg) before dinner. 120 tablet 2  . glucose blood (TRUE METRIX BLOOD GLUCOSE TEST) test strip 1 each by Other route 2 (two) times daily. Use to check blood sugars twice a day Dx E11.9 180 each 3  . glucose blood (TRUETEST TEST) test strip Test up to three times daily 100 each 12  . Insulin Glargine (TOUJEO SOLOSTAR) 300 UNIT/ML SOPN Inject 30 Units into the skin daily. 1.5 mL 11  . Insulin Pen Needle (NOVOFINE) 32G X 6 MM MISC 1 Act by Does not apply route daily. Use QD 100 each 3  . rivaroxaban (XARELTO) 20 MG TABS tablet Take 1 tablet (20 mg total) by mouth daily with supper. 30 tablet 11  . sertraline (ZOLOFT) 50 MG tablet Take 1 tablet (50 mg total) by mouth daily. 30 tablet 11  . simvastatin (ZOCOR) 40 MG tablet Take 1 tablet (40 mg total) by mouth every evening. 90 tablet 3  . sitaGLIPtin (JANUVIA) 100 MG tablet Take 1 tablet (100 mg total) by mouth daily. 90 tablet 3  . tadalafil (CIALIS) 5 MG tablet Take 1 tablet (5 mg  total) by mouth daily as needed for erectile dysfunction. 30 tablet 11  . traMADol (ULTRAM) 50 MG tablet Take 1 tablet (50 mg total) by mouth every 8 (eight) hours as needed. 60 tablet 5  . TRUEPLUS LANCETS 33G MISC Use to help check blood sugars twice a day Dx E11.9 180 each 3  . valsartan-hydrochlorothiazide (DIOVAN-HCT) 320-12.5 MG per tablet Take 1 tablet by mouth daily. 90 tablet 3   No current facility-administered medications on file prior to visit.   Allergies  Allergen  Reactions  . Metformin And Related     diarrhea  . Codeine Rash    All over the body   Family History  Problem Relation Age of Onset  . Hypertension Mother   . Diabetes Mother    PE: BP 110/62 mmHg  Pulse 82  Temp(Src) 98.1 F (36.7 C) (Oral)  Resp 12  Wt 194 lb 9.6 oz (88.27 kg)  SpO2 94% Body mass index is 26.39 kg/(m^2). Wt Readings from Last 3 Encounters:  07/29/15 194 lb 9.6 oz (88.27 kg)  07/17/15 193 lb (87.544 kg)  07/14/15 192 lb (87.091 kg)   Constitutional: normal weight, in NAD Eyes: PERRLA, EOMI, no exophthalmos ENT: moist mucous membranes, no thyromegaly, no cervical lymphadenopathy Cardiovascular: RRR, No MRG Respiratory: CTA B Gastrointestinal: abdomen soft, NT, ND, BS+ Musculoskeletal: no deformities, strength intact in all 4 Skin: moist, warm, no rashes Neurological: no tremor with outstretched hands, DTR normal in all 4  ASSESSMENT: 1. DM2, insulin-independent, uncontrolled, with complications - DR with macular degeneration  PLAN:  1. Patient with long-standing, uncontrolled diabetes, on oral antidiabetic regimen. His last hemoglobin A1c was very high at last visit, but he mentioned that he was off the medicines for several days before this check. I suspect that he has been off for longer than that. At last visit, we continued the Metformin and increased Amaryl to 2x a day. Sugars today are almost all at goal!  - I suggested to:  Patient Instructions  Please continue: -  Invokana 300 mg in am. - Amaryl 2 mg 2x a day, before breakfast and dinner.  Check sugars 2x a day, rotating check times.  Please let me know if the sugars are consistently <70 or >200.  Please return in 2 month with your sugar log.  - continue to check at different times of the day - check 2 times a day, rotating checks - given sugar log and advised how to fill it and to bring it at next appt  - advised for yearly eye exams >> he is up-to-date - He already had the flu shot this season - Return to clinic in 2 mo with sugar log >> will repeat HbA1c then

## 2015-07-30 ENCOUNTER — Other Ambulatory Visit: Payer: Self-pay | Admitting: Licensed Clinical Social Worker

## 2015-07-30 NOTE — Patient Outreach (Signed)
[facility]) Care Management  07/30/2015  Charles Parker [birth year] [id number]   Assessment-CSW completed outreach to patient on 07/30/15 after receiving referral from [place] to aid patient in gaining food pantry resources. CSW introduced self and reason for call. Patient provided HIPPA verifications. Patient reports that he has not been to his local food pantry in over a month but is agreeable to receiving information on both food pantries and where to receive a free meal daily. Patient reports that he has been eating regularly. Patient confirms that he has stable transportation to get to and from food pantries. CSW completed psychosocial assessment with patient. Patient denies any feelings of symptoms of depression or hopelessness. Patient denies any substance use history. Patient confirms that he has no advance directive and no interest in completing one at this time. Patient reports that he lost his wife in 2006 which led to him losing his house. Patient shares that he has dealt with financial difficulties since then but "for the most part I'm doing fine." Patient is in the process of gaining food stamps. Patient agreeable to CSW mailing out community resource guide for seniors in [place]. Patient agreeable to CSW completing outreach next week to review food pantry list and available free meals that are locally served daily.  Plan-CSW will complete outreach next week. CSW will send message through in basket message to [place] to mail out Health and safety inspector for senior, where to get free meals daily and food pantry list.  Charles Parker, BSW, MSW, [email or web address] Phone: [phone] Fax: [phone]

## 2015-08-01 ENCOUNTER — Other Ambulatory Visit: Payer: Self-pay | Admitting: Pharmacist

## 2015-08-01 ENCOUNTER — Telehealth: Payer: Self-pay | Admitting: *Deleted

## 2015-08-01 DIAGNOSIS — I824Y1 Acute embolism and thrombosis of unspecified deep veins of right proximal lower extremity: Secondary | ICD-10-CM

## 2015-08-01 DIAGNOSIS — K9189 Other postprocedural complications and disorders of digestive system: Secondary | ICD-10-CM

## 2015-08-01 DIAGNOSIS — R3916 Straining to void: Secondary | ICD-10-CM

## 2015-08-01 DIAGNOSIS — I10 Essential (primary) hypertension: Secondary | ICD-10-CM

## 2015-08-01 DIAGNOSIS — N401 Enlarged prostate with lower urinary tract symptoms: Secondary | ICD-10-CM

## 2015-08-01 DIAGNOSIS — F418 Other specified anxiety disorders: Secondary | ICD-10-CM

## 2015-08-01 DIAGNOSIS — Z9049 Acquired absence of other specified parts of digestive tract: Secondary | ICD-10-CM

## 2015-08-01 DIAGNOSIS — R197 Diarrhea, unspecified: Secondary | ICD-10-CM

## 2015-08-01 DIAGNOSIS — I82401 Acute embolism and thrombosis of unspecified deep veins of right lower extremity: Secondary | ICD-10-CM

## 2015-08-01 DIAGNOSIS — E785 Hyperlipidemia, unspecified: Secondary | ICD-10-CM

## 2015-08-01 MED ORDER — SERTRALINE HCL 50 MG PO TABS: 50.0000 mg | ORAL_TABLET | Freq: Every day | ORAL | Status: DC

## 2015-08-01 MED ORDER — COLESEVELAM HCL 3.75 G PO PACK: 1.0000 | PACK | Freq: Every day | ORAL | Status: DC

## 2015-08-01 MED ORDER — VALSARTAN-HYDROCHLOROTHIAZIDE 320-12.5 MG PO TABS: 1.0000 | ORAL_TABLET | Freq: Every day | ORAL | Status: DC

## 2015-08-01 MED ORDER — DUTASTERIDE 0.5 MG PO CAPS: 0.5000 mg | ORAL_CAPSULE | Freq: Every day | ORAL | Status: DC

## 2015-08-01 MED ORDER — RIVAROXABAN 20 MG PO TABS: 20.0000 mg | ORAL_TABLET | Freq: Every day | ORAL | Status: DC

## 2015-08-01 MED ORDER — EZETIMIBE 10 MG PO TABS: 10.0000 mg | ORAL_TABLET | Freq: Every day | ORAL | Status: DC

## 2015-08-01 NOTE — Patient Outreach (Signed)
Received a call back from [place] in Dr. [name]' office. Lucy reviews Dr. [name]' notes and states that patient is to still be taking sertraline, valsartan/HCTZ, Invokana, Xarelto, colesevelam, dutasteride and Zetia. Ask if Dr. [name] might have any Xarelto samples in the office to give the patient. Lorre Nick finds that the office does have samples of the Xarelto 20 mg tablets. States that she will leave a 2 weeks supply for the patient at the front desk in the office for the patient to pick up today.  Also request to have patient's active medications sent to [facility] for cost savings to the patient. Lorre Nick states that Dr. [name] did this at the patient's last appointment, but that she will make sure that all active medications were sent.  Will call the patient now about picking up the samples of Xarelto and about [employer].  Harlow Asa, PharmD Clinical Pharmacist [place] [facility] [phone]

## 2015-08-01 NOTE — Patient Outreach (Signed)
Called to follow up with patient's PCP, Dr. [name], to confirm what medications the patient is to be taking. Patient reports that his doctor has stopped several of his medications, including his Invokana, colesevelam, dutasteride, Zetia, Xarelto, sertraline, Januvia, and valsartan/HCTZ. However, per Dr. [name]' note from 07/14/15, provider indicated patient to still be taking these. Left message with triage nurse in Dr. [name]' office. Will also request to have patient's active medications sent to [facility] for cost savings to the patient.  If have not heard back from Dr. [name]' office by 08/04/15, will call again at that time.  Harlow Asa, PharmD Clinical Pharmacist [place] [facility] [phone]

## 2015-08-01 NOTE — Telephone Encounter (Signed)
Left msg on triage requesting call back concerning pt medications. Called Benjamine Mola back she states she is very concern about patient & his medications. He told her that md stop majority of his meds. Pt saw md on 07/1015 verified all medications that pt is suppose to be taking. She states that he hasn't had any xarelto wanting to know if we had some samples. Inform will leave up front. Also wanting to know can refills be sent to [facility]. MD had sent some on 07/14/15, will go ahead & send remaining maintenance meds to [facility]...[name]

## 2015-08-01 NOTE — Patient Outreach (Signed)
Charles Parker is a 69 y.o. male referred to pharmacy for medication assistance. Patient reports that he has been having trouble with affording his medications. Note that patient has [employer]. Patient reports that Nurse Care Manager helped him with setting it up to get his medications through [place] last week.  Discussed with patient the Extra Help application. Patient reports that he would be interested in having help with completing this. Let him know that I will reach out to Care Management Assistant Damita Rhodie to ask her to complete this with him.  Patient reports that his doctor has stopped several of his medications, including his Invokana, colesevelam, dutasteride, Zetia, Xarelto, sertraline, Januvia, and valsartan/HCTZ. However, per Dr. [name]' note from 07/14/15, provider indicated patient to still be taking each of these.   Also follow up with Nurse Care Manager Erenest Rasher who reports that she helped the patient with applications during their visit, but that she did not set him up with [place].   PLAN:  1) Will call the follow up with patient's PCP, Dr. [name], to confirm what medications the patient is to be taking. During this call, will also request to have patient's active medications sent to [facility] for cost savings to the patient.  2) Will call to follow up with the patient after speaking with Dr. [name]' office.  3) Will send InBasket message to Care Management Assistant Damita Rhodie asking her to complete the Extra Help application with him.   Harlow Asa, PharmD Clinical Pharmacist [place] Management [phone]

## 2015-08-01 NOTE — Patient Outreach (Addendum)
Called Mr. Bantz back to let him know that I spoke with Lorre Nick in Dr. [name]' office who states that patient is to still be taking sertraline, valsartan/HCTZ, Invokana, Xarelto, colesevelam, dutasteride and Zetia. Counsel patient on the importance of medication adherence. Counsel patient on the particular importance of adherence to his Xarelto. Let patient know that I when I spoke with Lucy in Dr. [name]' office, she stated that the office has samples of Xarelto 20mg  for him that she will leave at the front desk. Counsel patient that this is to be taken once daily with supper. Patient verbalizes understanding and states that he will go to pick this medication up from the office right now.  Let patient know that I also requested to have patient's active medications sent to [facility] for cost savings to the patient. Provide patient with the phone number for [facility] Order and counsel him to call this number as soon as he returns home to initiate his mail order deliver for cost savings.  Patient reports that he has no further questions for me at this time. Confirm that he has my contact number. Schedule with patient a co-visit with Nurse Care Manager Erenest Rasher to see him on 08/13/15 at [place], PharmD [place] [facility] [phone]

## 2015-08-04 ENCOUNTER — Telehealth: Payer: Self-pay | Admitting: Internal Medicine

## 2015-08-04 ENCOUNTER — Other Ambulatory Visit: Payer: Self-pay | Admitting: Pharmacist

## 2015-08-04 NOTE — Patient Outreach (Signed)
Called to follow up with Mr. Turko to confirm that he picked up the Xarelto samples from Dr. [name]' office. Review with patient the signs of a blood clot as well as signs of bleeding/bruising and how to manage these signs. Mr. fields oros understanding.   Let patient know that I have reached out to Care Management Assistant Damita Rhodie and that she will be contacting him to help him to complete the Extra Help application. Also let him know that she is starting the patient assistance applications for Xarelto and Invokana on the patient's behalf and that I will be bringing both of these forms for the patient to sign when I see him next week.  Patient reports that he has not yet called [employer] about initiating mail order. Encourage patient to look at the bottles of medication that he currently has at home to see which ones are running low and to call [facility] about sending the refills for these particular medications.  Confirm that patient has my phone number. Will follow up with patient again at our visit on 08/13/15.  Harlow Asa, PharmD Clinical Pharmacist [place] Management [phone]

## 2015-08-04 NOTE — Telephone Encounter (Deleted)
Elizabeth from [facility], stated that patient is not taking his medication please advise [phone]

## 2015-08-04 NOTE — Patient Outreach (Signed)
[facility]) Care Management  08/04/2015  Charles Parker 1946-03-17 [id number]   Telephone outreach to patient to assist in completing the extra help application and patient assistance application. He was willing to do it over the phone.  We completed the application online and submitted it today.  He should hear from social security administration in the next 2 to 4 weeks.  He is going to me when he hears from them. The patient assistance application will be given to Charles Parker, PharmD to get patient to sign. Once I receive documents back from both patient and physician, I will send them off to [facility] for processing. I will follow up with the patient in 3 weeks.  Charles Parker, [place] Care Management Assistant

## 2015-08-05 NOTE — Telephone Encounter (Signed)
Charles Parker, can you please talk to her: Is Invokana the problem or both invokana and Amaryl? I just saw him in the office and he did not mention not affording Invokana.Marland KitchenMarland Kitchen

## 2015-08-05 NOTE — Telephone Encounter (Signed)
Please read message below. Dr Benjamine Mola, clinical pharmacist is with [place] [facility]. Can call and leave her a vm. She will be back in the office tomorrow.

## 2015-08-05 NOTE — Patient Outreach (Signed)
[facility]) Care Management  08/05/2015  LOY MCCARTT 1946/07/09 [id number]   Request received from [name], LCSW to send patient a community resource guide for seniors and food pantry and free meals list. Packet sent 08/05/2015.  Blinda Turek L. Satori Krabill, [place] Care Management Assistant

## 2015-08-07 NOTE — Patient Outreach (Signed)
[facility]) Care Management  08/07/2015  JONATHANDAVID MARLETT 03/15/1946 [id number]   Telephone outreach to patient to see if he was able to order a replacement copy of his benefits statement from social security. Patient stated he had trouble using the automated system so we called together on 3-way. We were successfully able to request another copy be mailed to him and once he receives it in the mail, he will call me and then mail me a copy.  Karrington Mccravy L. Caidyn Blossom, [place] Care Management Assistant

## 2015-08-13 ENCOUNTER — Other Ambulatory Visit: Payer: Self-pay | Admitting: Pharmacist

## 2015-08-13 ENCOUNTER — Other Ambulatory Visit: Payer: Self-pay

## 2015-08-13 ENCOUNTER — Other Ambulatory Visit: Payer: Self-pay | Admitting: Licensed Clinical Social Worker

## 2015-08-13 ENCOUNTER — Telehealth: Payer: Self-pay | Admitting: Internal Medicine

## 2015-08-13 NOTE — Patient Outreach (Signed)
[facility]) Care Management  08/13/2015  SHELLY SHOULTZ May 06, 1946 [id number]   Joint home visit with Tommy Rainwater, [facility] for further assistance with assessment of community care coordination needs.  Patient was agreeable to joint home visit.  Application for Medicaid and EBT benefits completed, followed up

## 2015-08-13 NOTE — Patient Outreach (Signed)
Called patient's PCP office to let him know about patient's medication non-compliance due to cost and some medications the patient reports just no longer taking. Speak with Stefannie in Dr. [name]' office. Review with her which medications the patient reports to currently not be taking.   Let Adelina Mings know that Care Management has been working with the patient to help him to complete the Extra Help application and to complete patient assistance applications for his Xarelto and Invokana. Let Adelina Mings know that patient is currently taking the Xarelto samples provided by Dr. [name]' office while awaiting a response on that application.  Report that patient currently has not been taking his valsartan-hydrochlorothiazide due to cost. Report his elevated blood pressure from today of 160/107 with a pulse of 82. Let her know that patient reports that he will pick up this medication from his pharmacy today. However, also let Stefannie know that per the [place] website, this is a tier 2 medication for the patient, while lisinopril/hydrochlorotiazide would be a tier 1 alternative.  Provide my contact information. Stefannie reports that she will get back to me once she receives a response from Dr. [name]. If have not heard back by 08/15/15, will follow up again at that time.  Harlow Asa, PharmD Clinical Pharmacist [place] Management [phone]

## 2015-08-13 NOTE — Patient Outreach (Signed)
[facility]) Care Management  [place]   08/13/2015  KUSH FARABEE 09-16-1946 [id number]  Subjective: ZUBAYR BEDNARCZYK is a 69 y.o. male referred to pharmacy for medication assistance. Meet with Mr. Shostak in his home today to review his medications as a co-visit with Nurse Care Manager Erenest Rasher. Also deliver to Mr. Alegria the patient assistance forms for Xarelto and Invokana that have been prepared by Care Management Assistant Damita Rhodie. Patient completes and signs these forms during our visit. Nurse Care Manager will return these forms to [place] at the office today. Also provide patient with the envelope from [employer] to be used to send a copy of his benefits statement from social security once he has received this in the mail.  Patient shows me the medications that he is currently taking, glimepiride 2 mg before breakfast, Xarelto 20 mg (sample from Dr. [name]' office) daily with breakfast and simvastatin 40 mg daily. Note that patient currently has #17 tablets of Xarelto from the sample bottles from Dr. [name]' office left at this time. Patient reports that he has also been taking his Toujeo injection every morning. Review pen with patient and find that patient does not have any pen needles for this pen. Patient reports that he was under the impression that he was to use it as is, that the needle was inside. Patient states that he has never attached a pen needle when using this pen. Patient also has kept this pen and another that he was given at room temperature since receiving them as a sample from his PCP on 07/14/15. Review package insert with patient which states that this sample pen is only good for 28 days at room temperature. Patient discards pens. Patient shows me his blood sugar log, which reflects the following blood sugars:  08/10/15 - 145, morning fasting 08/11/15 - 125, before lunch 08/12/15 - 113, last night before dinner  08/13/15 - 125, this morning  fasting  Have patient check his blood sugar now with his meter and it is currently 168 mg/dL. Patient reports that he had a bowl of oatmeal for breakfast about 2 hours prior to this reading. While in the home with the patient, call the office of his Endocrinologist, Dr. Cruzita Lederer [phone]). Speak with Larene Beach, nurse with Dr. Cruzita Lederer, and let her know that patient has been taking his glimepiride 2 mg just once daily before breakfast, not currently taking the Invokana due to cost and that he has been attempting to inject the Toujeo, but without pen needles has not been successful. Also share with Larene Beach his blood sugar numbers. Larene Beach lets me know that Dr. Cruzita Lederer is out of the country until Monday, but that she will speak with one of the other physicians in the office for directions for how the patient should proceed and call us back. Let her know that our office is currently working on patient assistance for the patient's Invokana.  Note that patient does have his blood sugar meter, test strips, lancets and alcohol swabs from [employer]. Patient reports that he is currently not taking any of his other medications because they have been too expensive for him to afford or he is just no longer taking.  Check patient's blood pressure, it is currently 160/107 with a pulse of 82. Speak with patient about his blood pressure and the importance of taking his blood pressure medication. Call patient's [facility], patient has an active prescription for his valsartan/hydrochlorothiazide. Rob at [employer] runs this  prescription through for a 90 day supply and report that the patient's copay will be $31.80. Patient reports that he is able to afford to pick this up today. Reports that he will leave to do so after our visit. Patient also shows me that he has a blood pressure monitor. Instruct patient to monitor his blood pressure and record it in the log of his [place].  Patient reports that he  organizes his medications by keeping them on his dresser in his bedroom. Reports that each morning he takes out a dose of each medication and then takes this before eating breakfast. Offer to provide patient with a pillbox. Patient declines. Reports that he does not miss any doses because he has made taking these medications part of his routine. Review the use of each medication with the patient. Write note of the indication on each bottle for the patient.  Let Mr. Spratlin know that I will follow up with him later today after hearing back from [facility] at Dr. Arman Filter office. Let him know that I will also reach out to his PCP to let the provider know about the medications that the patient is not taking due to cost and to let him know about the patient's current blood pressure.   Objective:   Current Medications: Current Outpatient Prescriptions  Medication Sig Dispense Refill  . Alcohol Swabs (B-D SINGLE USE SWABS REGULAR) PADS Use as directed Dx E11.9 180 each 3  . Blood Glucose Monitoring Suppl (TRUE METRIX METER) W/DEVICE KIT Use as directed  Dx E11.9 1 kit 0  . glimepiride (AMARYL) 2 MG tablet Take 1 tablet (2 mg total) by mouth daily before breakfast. And 1 tablet (2 mg) before dinner. 120 tablet 2  . glucose blood (TRUE METRIX BLOOD GLUCOSE TEST) test strip 1 each by Other route 2 (two) times daily. Use to check blood sugars twice a day Dx E11.9 180 each 3  . glucose blood (TRUETEST TEST) test strip Test up to three times daily 100 each 12  . rivaroxaban (XARELTO) 20 MG TABS tablet Take 1 tablet (20 mg total) by mouth daily with supper. 90 tablet 3  . simvastatin (ZOCOR) 40 MG tablet Take 1 tablet (40 mg total) by mouth every evening. 90 tablet 3  . TRUEPLUS LANCETS 33G MISC Use to help check blood sugars twice a day Dx E11.9 180 each 3  . canagliflozin (INVOKANA) 300 MG TABS tablet Take 300 mg by mouth daily before breakfast. (Patient not taking: Reported on 08/01/2015) 90 tablet 3  .  celecoxib (CELEBREX) 200 MG capsule Take 1 capsule (200 mg total) by mouth every morning. (Patient not taking: Reported on 08/13/2015) 90 capsule 3  . Colesevelam HCl 3.75 G PACK Take 1 packet by mouth daily. (Patient not taking: Reported on 08/13/2015) 90 each 3  . cyclobenzaprine (FLEXERIL) 10 MG tablet Take 1 tablet (10 mg total) by mouth 2 (two) times daily as needed for muscle spasms. (Patient not taking: Reported on 08/01/2015) 10 tablet 0  . dutasteride (AVODART) 0.5 MG capsule Take 1 capsule (0.5 mg total) by mouth daily. (Patient not taking: Reported on 08/13/2015) 90 capsule 3  . ezetimibe (ZETIA) 10 MG tablet Take 1 tablet (10 mg total) by mouth daily. (Patient not taking: Reported on 08/13/2015) 90 tablet 3  . Insulin Glargine (TOUJEO SOLOSTAR) 300 UNIT/ML SOPN Inject 30 Units into the skin daily. (Patient not taking: Reported on 08/13/2015) 1.5 mL 11  . Insulin Pen Needle (NOVOFINE) 32G X 6  MM MISC 1 Act by Does not apply route daily. Use QD (Patient not taking: Reported on 08/13/2015) 100 each 3  . sertraline (ZOLOFT) 50 MG tablet Take 1 tablet (50 mg total) by mouth daily. (Patient not taking: Reported on 08/13/2015) 90 tablet 3  . sitaGLIPtin (JANUVIA) 100 MG tablet Take 1 tablet (100 mg total) by mouth daily. (Patient not taking: Reported on 08/01/2015) 90 tablet 3  . tadalafil (CIALIS) 5 MG tablet Take 1 tablet (5 mg total) by mouth daily as needed for erectile dysfunction. (Patient not taking: Reported on 08/01/2015) 30 tablet 11  . traMADol (ULTRAM) 50 MG tablet Take 1 tablet (50 mg total) by mouth every 8 (eight) hours as needed. (Patient not taking: Reported on 08/01/2015) 60 tablet 5  . valsartan-hydrochlorothiazide (DIOVAN-HCT) 320-12.5 MG tablet Take 1 tablet by mouth daily. (Patient not taking: Reported on 08/13/2015) 90 tablet 3   No current facility-administered medications for this visit.    Functional Status: In your present state of health, do you have any difficulty  performing the following activities: 07/24/2015 07/14/2015  Hearing? - N  Vision? - N  Difficulty concentrating or making decisions? - N  Walking or climbing stairs? - N  Dressing or bathing? - N  Doing errands, shopping? - N  Conservation officer, nature and eating ? N -  Using the Toilet? N -  In the past six months, have you accidently leaked urine? N -  Do you have problems with loss of bowel control? N -  Managing your Medications? N -  Managing your Finances? N -  Housekeeping or managing your Housekeeping? N -    Fall/Depression Screening: PHQ 2/9 Scores 07/17/2015 07/14/2015 11/02/2014 11/01/2014 11/01/2014  PHQ - 2 Score 0 0 0 0 0    Assessment:  1) Patient currently non-compliant to the diabetes regimen prescribed by his endocrinologist/PCP. Patient unable to afford Invokana and not administering Toujeo properly. Called endocrinologist office with patient to let the provider know what the patient has been taking, share his blood glucose readings and to determine how patient should proceed.  2) Patient currently non-compliant with his medications prescribed by his PCP, many due to cost and some patient reports no longer taking. Among these, currently not taking his valsartan-hydrochlorothiazide due to cost. Patient with elevated blood pressure today.  Plan:  1) Will wait for call back from [facility] in Dr. Arman Filter office and then follow up with patient about his diabetes medication regimen.  2) Patient to pick up his refill of valsartan-hydrochlorothiazide from his [place] today.  3) Will call to follow up with patient's PCP today about patient's non-compliance due to affordability and current blood pressure.  Harlow Asa, PharmD Clinical Pharmacist [place] [facility] [phone]

## 2015-08-13 NOTE — Telephone Encounter (Signed)
Called Benjamine Mola and advised her per Dr Ronnie Derby message below. Be advised.

## 2015-08-13 NOTE — Telephone Encounter (Signed)
Charles Parker, a clinical pharmacist with [place] [facility] [facility] is at the pt's home [phone]). She is returning my call. She said pt is not taking Invokana (cannot afford it), he is taking the glimepiride and he has not taken the Toujeo (which he received a sample of from Dr [name]' office) correctly. He did not have pen needles and was trying to inject the insulin.  Pt's blood sugar fasting this morning was 125, before dinner yesterday was 113 and before lunch on Monday was 125. Please advise in Dr Arman Filter absence if pt should continue on just the meds he is on: w/out adding the Invokana or the Toujeo?

## 2015-08-13 NOTE — Telephone Encounter (Signed)
Seen patient in home today and wants to speak to you about patient meds.

## 2015-08-13 NOTE — Patient Outreach (Signed)
Called to follow up with Charles Parker. Reports that he has not yet heard back from [facility] in Dr. Arman Filter office. Let him know that she stated that per Dr. Dwyane Dee, he is to continue taking glimepiride as he has been taking, 2 mg each morning before breakfast, and check his blood sugar until his upcoming visit with Dr. Cruzita Lederer. Patient to hold Invokana and not take Toujeo for now. Also let Charles Parker know that per Larene Beach, he is to call the office for blood sugar less than 80 mg/dL or greater than 180 mg/dL to discuss adjustment.  Also let Charles Parker know that I followed up with his PCP's office about the medications that he is currently not taking, affordability and his blood pressure today. Charles Parker reports that he picked up his blood pressure medication, took his first dose and will take it daily with his other medications as directed and check his blood pressure.  Confirmed that patient has my phone number. Let him know that I will follow up with him in 2 weeks, but to please call me sooner if he has any questions.  Harlow Asa, PharmD Clinical Pharmacist [place] [facility] [phone]

## 2015-08-13 NOTE — Patient Outreach (Signed)
Receive a call back from [facility] in Dr. Arman Filter office who states that she spoke with Dr. Dwyane Dee who advised that the patient should continue taking glimepiride as he has been taking, 2 mg each morning before breakfast, and check his blood sugar until his upcoming visit with Dr. Cruzita Lederer. Patient to hold Invokana and not take Toujeo for now.  Larene Beach states that she will also call the patient to provide him with these instructions as well. Also states that she will instruct the patient to call the office for blood sugar less than 80 mg/dL or greater than 180 mg/dL to discuss adjustment.  Harlow Asa, PharmD Clinical Pharmacist [place] [facility] [phone]

## 2015-08-13 NOTE — Telephone Encounter (Signed)
There are no lower cost alternatives

## 2015-08-13 NOTE — Telephone Encounter (Signed)
Continue present management unchanged

## 2015-08-13 NOTE — Telephone Encounter (Signed)
Spoke to [place] with [facility] and she had some concerns regarding the pts medications that are costly and medications that the pt is not taking anymore.  After a visit with the patient in the patients home [place] stated that the following meds were not longer being taken or picked up from pharmacy: Flexeril, tramadol, cialis, celebrex, sertraline, avodart. These have been discontinued due to pt has not taken in over a year.   The following pt meds are too expensive: Colesevelam, Zetia and Diovan. Are there any alternatives that are more affordable?   I can pull the pts formulary if needed.  Please advise at your convenience.

## 2015-08-13 NOTE — Patient Outreach (Signed)
[facility]) Care Management  08/13/2015  Charles Parker [birth year] [id number]   Assessment-CSW completed outreach to patient on 08/13/15. [name] Erenest Rasher was there completing a home visit with patient. Patient confirms that he has received list of community resources that CSW sent out. CSW reviewed food pantry list with patient over the phone and reminded him that document includes pantries locations, their required documents needed, their hours and days and how often you can visit the pantry. CSW also reviewed where to receive free meal list with patient over the phone. Patient reports medicaid and food stamps applications are still pending. Patient agreed to [place] contacting patient next week in order to review the community resource guide for seniors in [place].   Plan-CSW will complete outreach next week in order to review resources with patient over the phone and to follow up with referrals.  [name], BSW, MSW, [email or web address] Phone: [phone] Fax: [phone]

## 2015-08-14 NOTE — Patient Outreach (Signed)
[facility]) Care Management  August 13, 2015   Charles Parker November 01, 1945 [id number]    This [name] met with patient with Charles Parker, [name]. Patient continues to have difficulty with understanding and committing to the prescribed medication regimen.  This [name] made contact with the [facility] Department of Social Services case worker, Ms. Haywood [phone]). Ms. Renelda Loma stated she sent out a letter to patient advising him on other documents needed to complete patient's application for Medicaid and EBT benefits.     This [name] and patient agreed to make telephone contact on Monday, November 14 to see if letter from [place] has arrived by then.

## 2015-08-15 ENCOUNTER — Encounter: Payer: Self-pay | Admitting: Pharmacist

## 2015-08-15 NOTE — Telephone Encounter (Signed)
Noted.  No answer when I tried to call patient back.

## 2015-08-15 NOTE — Telephone Encounter (Signed)
OK. He should let us know about the sugars in 1 week.

## 2015-08-16 ENCOUNTER — Other Ambulatory Visit: Payer: Self-pay | Admitting: Internal Medicine

## 2015-08-16 DIAGNOSIS — I10 Essential (primary) hypertension: Secondary | ICD-10-CM

## 2015-08-16 DIAGNOSIS — E11311 Type 2 diabetes mellitus with unspecified diabetic retinopathy with macular edema: Secondary | ICD-10-CM

## 2015-08-16 MED ORDER — LOSARTAN POTASSIUM-HCTZ 100-12.5 MG PO TABS: 1.0000 | ORAL_TABLET | Freq: Every day | ORAL | Status: DC

## 2015-08-18 ENCOUNTER — Other Ambulatory Visit: Payer: Self-pay

## 2015-08-18 NOTE — Patient Outreach (Signed)
This [name] completed home viist after receiving call from patient advising her of his receipt of docuementation from [employer].    This [name] assisted patient by informing patient of the needed forms per letter received from [place].  Information needed for patient to collect are bank statements from June to July [id number], burial policies and information on automobiles owned by patient.    Patient to gather information, will call this [name] when he has information collected.

## 2015-08-27 ENCOUNTER — Other Ambulatory Visit: Payer: Self-pay | Admitting: Pharmacist

## 2015-08-27 NOTE — Patient Outreach (Signed)
Called to follow up with Charles Parker about his blood sugar, blood pressure, and medication assistance. Charles Parker reports that he has been doing well. Reports that his fasting blood sugar this morning was 140 mg/dL. Reports that he has not yet received a letter back from social security about the extra help application.  Reports that his blood pressure has been good, but that he is not at home right now so he cannot read the numbers from his log. Reports that he has been taking all of his medications, including the valsartan/hydrochlorothiazide every day. Reports that he just got two more medications in the mail from mail order, but cannot tell me what they are as he is not at home. Reports that he will not be at home again until this evening.  Confirm that Charles Parker has my phone number. Patient to call me back on Friday when he is at home so that he can tell me the names of the medications that he has received from mail order.  Harlow Asa, PharmD Clinical Pharmacist [place] [facility] [phone]

## 2015-09-01 ENCOUNTER — Other Ambulatory Visit: Payer: Self-pay | Admitting: Pharmacist

## 2015-09-01 NOTE — Patient Outreach (Signed)
[facility]) Care Management  09/01/2015  Charles Parker [birth year] [id number]   Telephone outreach to patient to check the status of the extra help application submitted to social security. Patient states he received the letter of approval for full subsidy assistance. I explained to the patient the price range he should pay for his medications in the future. I am happy to assist in any future needs that may arise. I am removing myself from the care team.  Marlana Mckowen L. Mailynn Everly, [place] Care Management Assistant

## 2015-09-01 NOTE — Patient Outreach (Signed)
Called to follow up with Mr. Goldson about the names of the medications that he has received from mail order. Mr. Bowder reports that he is not home today, but will be on Wednesday morning. Will call Mr. Flora again on 09/03/15.  Harlow Asa, PharmD Clinical Pharmacist [place] [facility] [phone]

## 2015-09-02 ENCOUNTER — Other Ambulatory Visit: Payer: Self-pay | Admitting: Licensed Clinical Social Worker

## 2015-09-02 NOTE — Patient Outreach (Signed)
[facility]) Care Management  09/02/2015  PARRISH SPOMER 02-26-1946 [id number]   Assessment-CSW completed outreach to patient to follow up on community referrals. CSW was unable to reach patient and left a HIPPA compliant voice message.  Plan-CSW will make second outreach attempt by 09/03/15.  [name], BSW, MSW, [email or web address] Phone: [phone] Fax: [phone]

## 2015-09-03 ENCOUNTER — Other Ambulatory Visit: Payer: Self-pay | Admitting: Pharmacist

## 2015-09-03 ENCOUNTER — Telehealth: Payer: Self-pay | Admitting: *Deleted

## 2015-09-03 ENCOUNTER — Other Ambulatory Visit: Payer: Self-pay

## 2015-09-03 NOTE — Patient Outreach (Signed)
Receive a phone call back from [facility] in Dr. Arman Filter office. Larene Beach states that per Dr. Cruzita Lederer, patient is to not make any changes to his medications for now, but to check sugars before rather than after meals and send them again in several days so that she can figure out a possible pattern. Also to check some sugars at bedtime or after dinner.  Larene Beach states that she will call the patient now to provide him with these directions as well.   Harlow Asa, PharmD Clinical Pharmacist [place] [facility] [phone]

## 2015-09-03 NOTE — Patient Outreach (Signed)
Called to follow up with Charles Parker about the names of the medications that he has received from mail order. Also, received an [employer] from Care Management Assistant Charles Parker letting me know that the patient has been approved for full extra help with his medications. Charles Parker reads to me the names of the medications that have come through [facility], simvastatin 40 mg and losartan-hydrochlorothiazide 100mg -12.5mg .   Note that per patient's EPIC medication record, patient's PCP did switch patient's valsartan-hydrochlorothiazide to losartan-hydrochlorothiazide per my recommendation for cost savings. Explained to Charles Parker that the losartan-hydrochlorothiazide is to replace his valsartan-hydrochlorothiazide. Patient states that he understands. However, review with patient his medications several times, to make sure that he will not duplicate his blood pressure medications and patient is unable to clearly teach-back over the phone what medications he is now to take. Will make a home visit to Charles Parker today in order to help him manage his medication correctly.  Harlow Asa, PharmD Clinical Pharmacist [place] [facility] [phone]

## 2015-09-03 NOTE — Telephone Encounter (Signed)
Home health nurse, Benjamine Mola [phone]), called stating that she is with the pt and his blood sugar this AM after breakfast (1 bowl of oatmeal) is 289. Other readings:  11/27    11/24    11/23   11/17 187 After lunch  184 After breakfast  275 After dinner 126  AM  Please advise is there needs to be any medication/insulin dose changes.

## 2015-09-03 NOTE — Telephone Encounter (Signed)
No changes for now, but please check sugars before rather than after meals and send them again in several days so I can figure out a possible pattern. Also, check some sugars at bedtime or after dinner.

## 2015-09-03 NOTE — Telephone Encounter (Signed)
Called Charles Parker and advised her per Dr Arman Filter message below. She voiced understanding and will touch base with [name]. Called pt and advised him as well. Pt voiced understanding and will call back to advise of his sugar readings.

## 2015-09-03 NOTE — Patient Outreach (Signed)
Home visit to Charles Parker today in order to help him manage his medication correctly and ensure that he is not taking both his previous, valsartan-hydrochlorothiazide, and current, losartan-hydrochlorothiazide, blood pressure medications together. Charles Parker has set out all of his medications for me to review. Counsel patient about the importance of taking his losartan-hydrochlorothiazide for managing his blood pressure. Mark on his valsartan-hydrochlorothiazide bottle that he is no longer taking this and that it has been replaced by the losartan-HCTZ. Patient verbalizes understanding and he puts this medication away so that he will not take it. Also note that patient now has a second bottle of simvastatin, help patient to label these to help him to remember to use the first bottle and then the second. Counsel patient again on the importance of taking simvastatin as directed in the evening to maximize efficacy. Patient reports that he feels that he can do this. Reports that he watches the evening news consistently every night. Reports that he will start taking the simvastatin while he watches this program each night.  Patient reports that he has an appointment to look at removing his cataracts on both eyes on 09/12/15.  Have patient check his blood sugar with me. It is currently 289 mg/dL. Reports having had a bowl of oatmeal about 1 hour ago. Review patient's blood sugar log. Note that patient has stopped recording numbers on this paper and that he has stopped recording dates of his readings as well. Review his most recent readings from his monitor:  11/27 after lunch 187 11/24 after breakfast 184 11/23 after dinner 275 11/17 before breakfast 126  Patient acknowledges that he has not been compliant with his blood sugar monitoring. Reports that he had been told by Charles Parker to check twice daily. Reports that when he sees a high number, he does not check for a couple of days, "waiting for it to come  down". Counsel patient about blood sugar, monitoring and the importance of checking his blood sugar as directed by his Endocrinologist. Patient verbalizes understanding and states that he is going to make a real effort to be compliant with her directions.   As patient had previously been instructed by his Endocrinologist's office to call if his blood sugars were running above 180 mg/dL, call Charles Parker office [phone]) now with the patient to report these numbers. Speak with Charles Parker, CMA with Charles Parker, and provide her the numbers from his monitor. Charles Parker states that she will call both myself and the patient back after speaking with Charles Parker.  Also have patient take his own blood pressure now. It is 115/74, pulse 87. Patient reports that he has not been checking his blood pressure. Reports that he only took the losartan/hydrochlorothiazide of his blood pressure medications this morning. Counsel patient about the importance of monitoring his blood pressure.  Plan:  1) Charles Parker office to follow up with patient directly for further direction.  2) Patient to take his medications consistently, including his simvastatin each night when he watches the news.  3) Will follow up with Charles Parker in 1 week.  Charles Parker, Charles Parker Clinical Pharmacist [place] [facility] [phone]

## 2015-09-04 ENCOUNTER — Other Ambulatory Visit: Payer: Self-pay

## 2015-09-04 ENCOUNTER — Ambulatory Visit: Payer: Commercial Managed Care - HMO | Admitting: Dietician

## 2015-09-04 NOTE — Patient Outreach (Signed)
Call patient to remind him of today's scheduled visit as requested by patient. Patient states he had not had time to gather supporting documents requested by his DSS case worker to complete his medicaid application.  Patient agrees to call this RN CM as soon as he gets his most recent bank statement and burial information was requested.  Patient and this RN CM reviewed care plan, updated interventions and updated as appropriate.  This RN CM and patient agreed to telephone contact next week (December 5) to assess his progress in collecting documents and to schedule his discharge home visit at that time.

## 2015-09-04 NOTE — Patient Outreach (Signed)
September 03, 2015  This [name] made telephone contact with patient to remind him of tomorrow's home visit. patient identified himself using HIPPA identifiers of date of birth and address.  Patient was reminded the purpose of the home visit was to assist him with collecting the documents requested by his [place] Case Worker needed to support his Medicaid application.  Documents needed were recent bank statements and pre burial information/policy.  Patient requested this [name] call him in the morning before coming.  Patient stated further he has been busy with family, church and other things and have not had time to collect paperwork, however, he stated he stated he would make an attempt to get the paperwork together tonight.  Plan: Call patient in the morning of 12/01 to assess need for home visit.

## 2015-09-08 ENCOUNTER — Other Ambulatory Visit: Payer: Self-pay

## 2015-09-08 NOTE — Patient Outreach (Signed)
Home visit completed after patient called to advise this [name] he had gone to bank, obtained copies of last 2 months bank statements.  Patient had also copied his drivers license to which completes the requested documents to complete Medicaid application. Patient has received a new glucometer from [facility].  Patient was able to demonstrate use of glucometer.  Reports his blood glucose was 140 this morning, fasting.   Plan:  Discharge home visit later this month for completion of diabetic education, connecting with additional community resources if needed.

## 2015-09-09 ENCOUNTER — Other Ambulatory Visit: Payer: Self-pay | Admitting: Licensed Clinical Social Worker

## 2015-09-09 NOTE — Patient Outreach (Signed)
[place] [facility]) Care Management  09/09/2015  Charles Parker 10/11/1945 [id number]   Assessment-CSW completed outreach to patient on 09/09/15. The first call went straight to voice mail and the second call rang and eventually went to voice mail. CSW left a HIPPA compliant voice message encourage patient to return call once available.   Plan-CSW will make an additional outreach by 09/17/15 in order to check on community resources and follow up with medicaid and food stamp application than [name] has been working with him on.  [name], BSW, MSW, [email or web address] Phone: [phone] Fax: [phone]

## 2015-09-12 ENCOUNTER — Other Ambulatory Visit: Payer: Self-pay | Admitting: Internal Medicine

## 2015-09-12 ENCOUNTER — Other Ambulatory Visit: Payer: Self-pay | Admitting: Pharmacist

## 2015-09-12 DIAGNOSIS — H2513 Age-related nuclear cataract, bilateral: Secondary | ICD-10-CM | POA: Diagnosis not present

## 2015-09-12 DIAGNOSIS — H40013 Open angle with borderline findings, low risk, bilateral: Secondary | ICD-10-CM | POA: Diagnosis not present

## 2015-09-12 NOTE — Patient Outreach (Signed)
Called to follow up with Mr. Dillingham about his blood sugar and his medication adherence. Mr .Littau reports that he has been trying to check his blood sugar twice daily before meals as instructed by Dr. Cruzita Lederer. Patient reports that he forgot to test on Tuesday and Thursday, but that his blood sugar on Wednesday was 147 mg/dL before breakfast and 138 mg/dL before dinner. Reports that today it was 154 mg/dL before breakfast. Reports that he has been taking his glimepiride as directed on his bottle twice daily, in the morning before breakfast and in the evening before dinner when he watches the evening news. Reports that he has been continuing to take his simvastatin in the morning with his other medications, as this is when he remembers to take it.   Patient reports that he needs a refill of his glimepiride, which he will call in and go to pick up today.   Encouraged patient to continue to work on checking his blood sugar twice daily and recording it in his log. Reminded patient that Dr. Cruzita Lederer has asked him to do this and then call her to report these readings. Patient reports that he will work on that and follow up with her next week.  Let Mr. Stolzenburg know that I will stop following him for now, but confirmed that he has my phone number and asked that he call with any future medication questions.   Harlow Asa, PharmD Clinical Pharmacist [place] [facility] [phone]

## 2015-09-16 ENCOUNTER — Other Ambulatory Visit: Payer: Self-pay | Admitting: Licensed Clinical Social Worker

## 2015-09-16 LAB — HM DIABETES EYE EXAM

## 2015-09-16 NOTE — Patient Outreach (Signed)
[place] [facility]) Care Management  09/16/2015  Charles Parker [birth year] [id number]   Assessment-CSW completed outreach to patient on 09/16/15 to follow up on community resources but was unable to successfully reach him. CSW left a HIPPA compliant voice message encouraging patient to return call once available.  Plan-CSW will make an additional outreach by 09/22/15.  [name], BSW, MSW, [email or web address] Phone: [phone] Fax: [phone]

## 2015-09-23 ENCOUNTER — Other Ambulatory Visit: Payer: Self-pay | Admitting: Licensed Clinical Social Worker

## 2015-09-23 ENCOUNTER — Ambulatory Visit: Payer: Commercial Managed Care - HMO | Admitting: Internal Medicine

## 2015-09-23 NOTE — Patient Outreach (Signed)
[place] [facility]) Care Management  09/23/2015  Charles Parker 21-Apr-1946 [id number]   Assessment-CSW completed outreach on 09/23/15 and was able to successfully reach patient. Patient reports that he is "doing well." CSW informed patient that she has had a difficult time getting in touch with patient over the last few weeks. CSW questioned if patient has been using community resource information that were provided to him through mail. CSW reviewed the [employer] with patient. Patient reports that his main focus at this time has been to gain Medicaid. Patient reports that he has not heard from [place] caseworker again since applying for Medicaid. Patient shares frustration for not receiving a letter in the mail confirming that he has Medicaid. CSW will contact DSS to check on Medicaid status per patient's approval. CSW contacted DSS and was informed that patient's application was denied on 09/11/15. Caseworker shares reason for denial is because he was did not send in required documents and that they made multiple attempts to contact patient but were unable to reach him. CSW was informed that patient will need to complete an entire new application. CSW attempted outreach to patient to provide updates but was unable to reach him. CSW contaced [name] Erenest Rasher to provide updates.  Plan-CSW will continue to make outreaches to patient in order to assist him with social work needs.  [name], BSW, MSW, [email or web address] Phone: [phone] Fax: [phone]

## 2015-10-08 ENCOUNTER — Telehealth: Payer: Self-pay | Admitting: Internal Medicine

## 2015-10-08 NOTE — Telephone Encounter (Signed)
Yes, I do have some samples

## 2015-10-08 NOTE — Telephone Encounter (Signed)
Do we have any samples?

## 2015-10-08 NOTE — Telephone Encounter (Signed)
Pt states he received samples of rivaroxaban (XARELTO) 20 MG TABS tablet [id number]  And he is requesting another bottle. He would like to pick up today. Please advise

## 2015-10-08 NOTE — Telephone Encounter (Signed)
PT informed. Samples will be at the front

## 2015-10-13 ENCOUNTER — Other Ambulatory Visit: Payer: Self-pay | Admitting: Licensed Clinical Social Worker

## 2015-10-13 NOTE — Patient Outreach (Signed)
[place] [facility]) Care Management  10/13/2015  Charles Parker 1946-01-13 [id number]   Assessment-CSW completed outreach to patient on 10/13/15 but was unable to reach patient successfully. CSW continues to have difficulty maintaining contact with patient. CSW left a HIPPA compliant voice message.  Plan-CSW will await for return call from patient and will complete an additional outreach by 10/20/15 if no return call has been completed.  [name], BSW, MSW, [email or web address] Phone: [phone] Fax: [phone]

## 2015-10-21 ENCOUNTER — Other Ambulatory Visit: Payer: Self-pay | Admitting: Licensed Clinical Social Worker

## 2015-10-21 NOTE — Patient Outreach (Signed)
[place] [facility]) Care Management  10/21/2015  Charles Parker 08/18/1946 [id number]   Assessment-CSW completed second outreach to patient but was unable to successfully reach him. CSW left a HIPPA compliant voice message urging patient to return cal once available.  Plan-CSW will complete third outreach attempt by 10/31/15 if no return call has been placed.  [name], BSW, MSW, [name] Phone: [phone] Fax: [phone]

## 2015-10-23 ENCOUNTER — Other Ambulatory Visit: Payer: Self-pay | Admitting: Internal Medicine

## 2015-11-04 ENCOUNTER — Encounter: Payer: Self-pay | Admitting: Licensed Clinical Social Worker

## 2015-11-04 ENCOUNTER — Other Ambulatory Visit: Payer: Self-pay | Admitting: Licensed Clinical Social Worker

## 2015-11-04 NOTE — Patient Outreach (Signed)
[name]) Care Management  11/04/2015  Charles Parker May 21, 1946 [id number]   Assessment-CSW continues to have difficulty maintain contact with patient. CSW completed outreach and was unable to reach him. CSW left a HIPPA compliant voice message encouraging member to return call once available. CSW will send barrier letter to patient at this time.  Plan-CSW will await 10 days to hear back from patient before completing discharge.  Charles Parker, BSW, MSW, [email or web address] Phone: [phone] Fax: [phone]

## 2015-11-11 ENCOUNTER — Encounter (HOSPITAL_COMMUNITY): Payer: Self-pay | Admitting: Emergency Medicine

## 2015-11-11 ENCOUNTER — Emergency Department (HOSPITAL_COMMUNITY)
Admission: EM | Admit: 2015-11-11 | Discharge: 2015-11-12 | Disposition: A | Discharge status: Home / Self Care | Payer: Commercial Managed Care - HMO | Attending: Emergency Medicine | Admitting: Emergency Medicine

## 2015-11-11 ENCOUNTER — Ambulatory Visit (INDEPENDENT_AMBULATORY_CARE_PROVIDER_SITE_OTHER): Payer: Commercial Managed Care - HMO | Admitting: Internal Medicine

## 2015-11-11 ENCOUNTER — Other Ambulatory Visit (INDEPENDENT_AMBULATORY_CARE_PROVIDER_SITE_OTHER): Payer: Commercial Managed Care - HMO

## 2015-11-11 ENCOUNTER — Telehealth: Payer: Self-pay

## 2015-11-11 ENCOUNTER — Encounter: Payer: Self-pay | Admitting: Internal Medicine

## 2015-11-11 VITALS — BP 124/88 | HR 80 | Temp 97.8°F | Resp 16 | Ht 72.0 in | Wt 189.0 lb

## 2015-11-11 DIAGNOSIS — R251 Tremor, unspecified: Secondary | ICD-10-CM | POA: Diagnosis not present

## 2015-11-11 DIAGNOSIS — Z86010 Personal history of colonic polyps: Secondary | ICD-10-CM | POA: Insufficient documentation

## 2015-11-11 DIAGNOSIS — I10 Essential (primary) hypertension: Secondary | ICD-10-CM

## 2015-11-11 DIAGNOSIS — E1165 Type 2 diabetes mellitus with hyperglycemia: Secondary | ICD-10-CM

## 2015-11-11 DIAGNOSIS — Z794 Long term (current) use of insulin: Secondary | ICD-10-CM | POA: Diagnosis not present

## 2015-11-11 DIAGNOSIS — Z7984 Long term (current) use of oral hypoglycemic drugs: Secondary | ICD-10-CM | POA: Diagnosis not present

## 2015-11-11 DIAGNOSIS — L609 Nail disorder, unspecified: Secondary | ICD-10-CM | POA: Diagnosis not present

## 2015-11-11 DIAGNOSIS — Z7901 Long term (current) use of anticoagulants: Secondary | ICD-10-CM | POA: Insufficient documentation

## 2015-11-11 DIAGNOSIS — E7800 Pure hypercholesterolemia, unspecified: Secondary | ICD-10-CM | POA: Insufficient documentation

## 2015-11-11 DIAGNOSIS — Z79899 Other long term (current) drug therapy: Secondary | ICD-10-CM | POA: Insufficient documentation

## 2015-11-11 DIAGNOSIS — E11311 Type 2 diabetes mellitus with unspecified diabetic retinopathy with macular edema: Secondary | ICD-10-CM | POA: Diagnosis not present

## 2015-11-11 DIAGNOSIS — L602 Onychogryphosis: Secondary | ICD-10-CM

## 2015-11-11 DIAGNOSIS — E785 Hyperlipidemia, unspecified: Secondary | ICD-10-CM

## 2015-11-11 DIAGNOSIS — Z85028 Personal history of other malignant neoplasm of stomach: Secondary | ICD-10-CM | POA: Insufficient documentation

## 2015-11-11 LAB — CBC WITH DIFFERENTIAL/PLATELET
BASOS PCT: 0.5 % (ref 0.0–3.0)
Basophils Absolute: 0 10*3/uL (ref 0.0–0.1)
Eosinophils Absolute: 0 10*3/uL (ref 0.0–0.7)
Eosinophils Relative: 0.4 % (ref 0.0–5.0)
HCT: 44.7 % (ref 39.0–52.0)
Hemoglobin: 14.9 g/dL (ref 13.0–17.0)
LYMPHS ABS: 2 10*3/uL (ref 0.7–4.0)
Lymphocytes Relative: 30.1 % (ref 12.0–46.0)
MCHC: 33.2 g/dL (ref 30.0–36.0)
MCV: 92.2 fl (ref 78.0–100.0)
MONOS PCT: 7.6 % (ref 3.0–12.0)
Monocytes Absolute: 0.5 10*3/uL (ref 0.1–1.0)
NEUTROS ABS: 4.2 10*3/uL (ref 1.4–7.7)
NEUTROS PCT: 61.4 % (ref 43.0–77.0)
Platelets: 208 10*3/uL (ref 150.0–400.0)
RBC: 4.85 Mil/uL (ref 4.22–5.81)
RDW: 13.4 % (ref 11.5–15.5)
WBC: 6.8 10*3/uL (ref 4.0–10.5)

## 2015-11-11 LAB — URINALYSIS, ROUTINE W REFLEX MICROSCOPIC
BILIRUBIN URINE: NEGATIVE
Hgb urine dipstick: NEGATIVE
KETONES UR: NEGATIVE
Leukocytes, UA: NEGATIVE
Nitrite: POSITIVE — AB
PH: 5.5 (ref 5.0–8.0)
RBC / HPF: NONE SEEN (ref 0–?)
Specific Gravity, Urine: 1.01 (ref 1.000–1.030)
TOTAL PROTEIN, URINE-UPE24: NEGATIVE
Urobilinogen, UA: 0.2 (ref 0.0–1.0)

## 2015-11-11 LAB — MICROALBUMIN / CREATININE URINE RATIO
Creatinine,U: 86.3 mg/dL
MICROALB/CREAT RATIO: 0.8 mg/g (ref 0.0–30.0)
Microalb, Ur: <0.7 mg/dL (ref 0.0–1.9)

## 2015-11-11 LAB — BASIC METABOLIC PANEL
BUN: 20 mg/dL (ref 6–23)
CALCIUM: 10 mg/dL (ref 8.4–10.5)
CO2: 28 meq/L (ref 19–32)
CREATININE: 1.28 mg/dL (ref 0.40–1.50)
Chloride: 92 mEq/L — ABNORMAL LOW (ref 96–112)
GFR: 71.52 mL/min (ref >60.00)
GLUCOSE: 538 mg/dL — AB (ref 70–99)
Potassium: 3.9 mEq/L (ref 3.5–5.1)
Sodium: 128 mEq/L — ABNORMAL LOW (ref 135–145)

## 2015-11-11 LAB — LIPID PANEL
CHOL/HDL RATIO: 4
CHOLESTEROL: 195 mg/dL (ref 0–200)
HDL: 43.3 mg/dL (ref >39.00)
NONHDL: 151.47
Triglycerides: 210 mg/dL — ABNORMAL HIGH (ref 0.0–149.0)
VLDL: 42 mg/dL — ABNORMAL HIGH (ref 0.0–40.0)

## 2015-11-11 LAB — CBG MONITORING, ED
GLUCOSE-CAPILLARY: 514 mg/dL — AB (ref 65–99)
GLUCOSE-CAPILLARY: 335 mg/dL — AB (ref 65–99)

## 2015-11-11 LAB — LDL CHOLESTEROL, DIRECT: Direct LDL: 118 mg/dL

## 2015-11-11 LAB — HEMOGLOBIN A1C: Hgb A1c MFr Bld: 12.7 % — ABNORMAL HIGH (ref 4.6–6.5)

## 2015-11-11 MED ORDER — SODIUM CHLORIDE 0.9 % IV BOLUS (SEPSIS)
1000.0000 mL | Freq: Once | INTRAVENOUS | Status: AC
  Administered 2015-11-11: 1000 mL via INTRAVENOUS

## 2015-11-11 MED ORDER — INSULIN ASPART 100 UNIT/ML ~~LOC~~ SOLN
7.0000 [IU] | Freq: Once | SUBCUTANEOUS | Status: AC
  Administered 2015-11-11: 7 [IU] via SUBCUTANEOUS
  Filled 2015-11-11: qty 1

## 2015-11-11 NOTE — Progress Notes (Signed)
Subjective:  Patient ID: Charles Parker, male    DOB: 1946/05/03  Age: 70 y.o. MRN: [id number]  CC: Hypertension and Diabetes   HPI Charles Parker presents for follow-up on diabetes, he has not been feeling well lately as he complains of polyuria, polydipsia, polyphagia. He has also had episodes of feeling shaky and tremulous. He is not compliant with the majority of his medications for diabetes. He is unable to answer why he is noncompliant but there are definitely cognitive concerns as well as financial issues.  Outpatient Prescriptions Prior to Visit  Medication Sig Dispense Refill  . Alcohol Swabs (B-D SINGLE USE SWABS REGULAR) PADS Use as directed Dx E11.9 180 each 3  . Blood Glucose Monitoring Suppl (TRUE METRIX METER) W/DEVICE KIT Use as directed  Dx E11.9 1 kit 0  . canagliflozin (INVOKANA) 300 MG TABS tablet Take 300 mg by mouth daily before breakfast. 90 tablet 3  . ezetimibe (ZETIA) 10 MG tablet Take 1 tablet (10 mg total) by mouth daily. 90 tablet 3  . glucose blood (TRUE METRIX BLOOD GLUCOSE TEST) test strip 1 each by Other route 2 (two) times daily. Use to check blood sugars twice a day Dx E11.9 180 each 3  . Insulin Glargine (TOUJEO SOLOSTAR) 300 UNIT/ML SOPN Inject 30 Units into the skin daily. 1.5 mL 11  . Insulin Pen Needle (NOVOFINE) 32G X 6 MM MISC 1 Act by Does not apply route daily. Use QD 100 each 3  . losartan-hydrochlorothiazide (HYZAAR) 100-12.5 MG tablet Take 1 tablet by mouth daily. 90 tablet 3  . rivaroxaban (XARELTO) 20 MG TABS tablet Take 1 tablet (20 mg total) by mouth daily with supper. 90 tablet 3  . simvastatin (ZOCOR) 40 MG tablet Take 1 tablet (40 mg total) by mouth every evening. 90 tablet 3  . sitaGLIPtin (JANUVIA) 100 MG tablet Take 1 tablet (100 mg total) by mouth daily. 90 tablet 3  . TRUEPLUS LANCETS 33G MISC Use to help check blood sugars twice a day Dx E11.9 180 each 3  . Colesevelam HCl 3.75 G PACK Take 1 packet by mouth daily. 90 each 3    . glimepiride (AMARYL) 2 MG tablet Take 1 tablet (2 mg total) by mouth daily before breakfast. And 1 tablet (2 mg) before dinner. 120 tablet 2  . glimepiride (AMARYL) 2 MG tablet TAKE 1 TABLET DAILY BEFORE BREAKFAST 90 tablet 1  . glimepiride (AMARYL) 2 MG tablet TAKE 1 TABLET DAILY BEFORE BREAKFAST 90 tablet 0   No facility-administered medications prior to visit.    ROS Review of Systems  Constitutional: Negative.  Negative for fever, chills, diaphoresis, appetite change, fatigue and unexpected weight change.  HENT: Negative.   Eyes: Negative.  Negative for visual disturbance.  Respiratory: Negative.  Negative for cough, choking, chest tightness, shortness of breath and stridor.   Cardiovascular: Negative.  Negative for chest pain, palpitations and leg swelling.  Gastrointestinal: Negative.  Negative for nausea, vomiting, abdominal pain, diarrhea and constipation.  Endocrine: Positive for polydipsia, polyphagia and polyuria.  Genitourinary: Negative.  Negative for dysuria, urgency, hematuria and difficulty urinating.  Musculoskeletal: Negative.   Skin: Negative.   Allergic/Immunologic: Negative.   Neurological: Positive for dizziness and tremors. Negative for light-headedness and headaches.  Hematological: Negative.  Negative for adenopathy. Does not bruise/bleed easily.  Psychiatric/Behavioral: Negative.     Objective:  BP 124/88 mmHg  Pulse 80  Temp(Src) 97.8 F (36.6 C) (Oral)  Resp 16  Ht 6' (1.829 m)  Wt 189 lb (85.73 kg)  BMI 25.63 kg/m2  SpO2 97%  BP Readings from Last 3 Encounters:  11/11/15 124/88  08/13/15 160/107  08/13/15 160/107    Wt Readings from Last 3 Encounters:  11/11/15 189 lb (85.73 kg)  08/13/15 193 lb (87.544 kg)  07/29/15 194 lb 9.6 oz (88.27 kg)    Physical Exam  Constitutional: He is oriented to person, place, and time. He appears well-developed and well-nourished. No distress.  HENT:  Head: Normocephalic and atraumatic.  Mouth/Throat:  Oropharynx is clear and moist. No oropharyngeal exudate.  Eyes: Conjunctivae are normal. Right eye exhibits no discharge. Left eye exhibits no discharge. No scleral icterus.  Neck: Normal range of motion. Neck supple. No JVD present. No tracheal deviation present. No thyromegaly present.  Cardiovascular: Normal rate, regular rhythm, normal heart sounds and intact distal pulses.  Exam reveals no gallop and no friction rub.   No murmur heard. Pulmonary/Chest: Effort normal and breath sounds normal. No stridor. No respiratory distress. He has no wheezes. He has no rales. He exhibits no tenderness.  Abdominal: Soft. Bowel sounds are normal. He exhibits no distension and no mass. There is no tenderness. There is no rebound and no guarding.  Musculoskeletal: Normal range of motion. He exhibits no edema or tenderness.  Lymphadenopathy:    He has no cervical adenopathy.  Neurological: He is oriented to person, place, and time.  Skin: Skin is warm and dry. No rash noted. He is not diaphoretic. No erythema. No pallor.  Psychiatric: He has a normal mood and affect. His behavior is normal. Judgment and thought content normal.  Vitals reviewed.   Lab Results  Component Value Date   WBC 6.8 11/11/2015   HGB 14.9 11/11/2015   HCT 44.7 11/11/2015   PLT 208.0 11/11/2015   GLUCOSE 538* 11/11/2015   CHOL 195 11/11/2015   TRIG 210.0* 11/11/2015   HDL 43.30 11/11/2015   LDLDIRECT 118.0 11/11/2015   LDLCALC 151* 06/18/2015   ALT 36 07/10/2014   AST 36 07/10/2014   NA 128* 11/11/2015   K 3.9 11/11/2015   CL 92* 11/11/2015   CREATININE 1.28 11/11/2015   BUN 20 11/11/2015   CO2 28 11/11/2015   TSH 1.83 11/01/2014   PSA 3.06 07/14/2015   INR 2.1* 05/26/2009   HGBA1C 12.7* 11/11/2015   MICROALBUR <0.7 11/11/2015    No results found.  Assessment & Plan:   Charles Parker was seen today for hypertension and diabetes.  Diagnoses and all orders for this visit:  Essential hypertension- his blood pressure  is adequately well controlled, he has developed hyponatremia and hypochloremia which is most consistent with his hyperglycemia, he will be seen urgently today in the emergency room for further evaluation. 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 I have contacted him about his blood sugar and have asked him to be seen in the emergency room and possibly admitted for aggressive insulin therapy. -     Microalbumin / creatinine urine ratio; Future -     Basic metabolic panel; Future -  Hemoglobin A1c; Future -     Lipid panel; Future  Tremor- this may be related to his hyperglycemia, I cannot initiate any tremor today, he wants to see a neurologist to see if he is developing Parkinson's disease. -     Ambulatory referral to Neurology  Overgrown toenails -     Ambulatory referral to Podiatry  Hyperlipidemia with target LDL less than 70- improvement noted with statin therapy. -     Lipid panel; Future   I am having Mr. Goh maintain his sitaGLIPtin, simvastatin, canagliflozin, Insulin Glargine, Insulin Pen Needle, B-D SINGLE USE SWABS REGULAR, TRUE METRIX METER, glucose blood, TRUEPLUS LANCETS 33G, Colesevelam HCl, ezetimibe, rivaroxaban, losartan-hydrochlorothiazide, and glimepiride.  Meds ordered this encounter  Medications  . glimepiride (AMARYL) 2 MG tablet    Sig: TK 1 T D BEFORE BREAKFAST    Refill:  0     Follow-up: Return in about 6 months (around 05/10/2016).  Scarlette Calico, MD

## 2015-11-11 NOTE — Telephone Encounter (Signed)
G - pls let him know that his blood sugar is over 500, this is very dangerous, I think he should be admitted thru the ER for insulin therapy

## 2015-11-11 NOTE — Care Management (Signed)
ED CM noted patient to be active with [facility] Management program. Patient presents to [facility] ED from PCP with elevated blood glucose cbg 500. CM met with patient at bedside he denies any difficulty obtaining meds, he admits to being non- compliant. CM discussed the importance of monitoring CBG and being compliant with medications, also discuss used the risk and  complications of diabetes, CM provided diabetic management resources. Teach back done patient verbalized understanding. Recommended that patient continue with [facility] community care management services. No further ED CM needs identified.

## 2015-11-11 NOTE — Progress Notes (Signed)
Pre visit review using our clinic review tool, if applicable. No additional management support is needed unless otherwise documented below in the visit note. 

## 2015-11-11 NOTE — Telephone Encounter (Signed)
Pt informed of lab result. Pt complied stated he will head over.

## 2015-11-11 NOTE — ED Notes (Signed)
Pt sent here from PCP for hyperglycemia; pt sts routine check up and told to come here due to CBG 538

## 2015-11-11 NOTE — Discharge Instructions (Signed)
You were seen for elevated blood sugar.  You were given insulin and your blood sugar was monitored.  It appears that you have been prescribed insulin by Dr [name] since January of 2016.  You should call him in the morning to clarify this medication.  Given your recent A1c, you should be on insulin.     How and Where to Give Subcutaneous Insulin Injections, Adult People with type 1 diabetes must take insulin since their bodies do not make it. People with type 2 diabetes may require insulin. There are many different types of insulin as well as other injectable diabetes medicines that are meant to be injected into the fat layer under your skin. The type of insulin or injectable diabetes medicine you take may determine how many injections you give yourself and when to take the injections.  CHOOSING A SITE FOR INJECTION Insulin absorption varies from site to site. As with any injectable medication it is best for the insulin to be injected within the same body region. However, do not inject the insulin in the same spot each time. Rotating the spots you give your injections will prevent inflammation or tissue breakdown. There are four main regions that can be used for injections. The regions include the:  Abdomen (preferred region, especially for non-insulin injectable diabetes medicine).  Front and upper outer sides of thighs.  Back of upper arm.  Buttocks. USING A SYRINGE AND VIAL Drawing up insulin: single insulin dose 1. Wash your hands with soap and water. 2. Gently roll the insulin bottle (vial) between your hands to mix it. Do not shake the vial. 3. Clean the top rubber part of the vial with an alcohol wipe. Be sure that the plastic pop-top has been removed on newer vials. 4. Remove the plastic cover from the needle on the syringe. Do not let the needle touch anything. 5. Pull the plunger back to draw air into the syringe. The air should be the same amount as the insulin dose. 6. Push the needle  through the rubber on the top of the vial. Do not turn the vial over. 7. Push the plunger in all the way to put the air into the vial. 8. Leave the needle in the vial and turn the vial and syringe upside down. 9. Pull down slowly on the plunger, drawing the amount of insulin you need into the syringe. 10. Look for air bubbles in the syringe. You may need to push the plunger up and down 2 to 3 times to slowly get rid of any air bubbles in the syringe. 11. Pull back the plunger to get your correct dose. 12. Remove the needle from the vial. 13. Use an alcohol wipe to clean the area of the body to be injected. 14. Pinch up 1 inch of skin and hold it. 15. Put the needle straight into the skin (90-degree angle). Put the needle in as far as it will go (to the hub). The needle may need to be injected at a 45-degree angle in small adults with little fat. 16. When the needle is in, you can let go of your skin. 17. Push the plunger down all the way to inject the insulin. 18. Pull the needle straight out of the skin. 19. Press the alcohol wipe over the spot where you gave your injection. Keep it there for a few seconds. Do not rub the area. 20. Do not put the plastic cover back on the needle. Drawing up insulin: mixing 2 insulins  1. Wash your hands with soap and water. 2. Gently roll the vial of "cloudy" insulin between your hands or rotate the vial from top to bottom to mix. 3. Clean the top of both vials with an alcohol wipe. Be sure that the plastic pop-top lid has been removed on newer vials. 4. Pull air into the syringe to equal the dose of "cloudy" insulin. 5. Stick the needle into the "cloudy" insulin vial and inject the air. Be sure to keep the vial upright. 6. Remove the needle from the "cloudy" insulin vial. 7. Pull air into the syringe to equal the dose of "clear" insulin. 8. Stick the needle into the "clear" insulin vial and inject the air. 9. Leave the needle in the "clear" insulin vial and  turn the vial upside down. 10. Pull down on the plunger and slowly draw into the syringe the number of units of "clear" insulin desired. 11. Look for air bubbles in the syringe. You may need to push the plunger up and down 2 to 3 times to slowly get rid of any air bubbles in the syringe. 12. Remove the needle from the "clear" insulin vial. 13. Stick the needle into the "cloudy" insulin vial. Do not inject any of the "clear" insulin into the "cloudy" vial. 14. Turn the "cloudy" vial upside down and pull the plunger down to the number of units that equals the total number of units of "clear" and "cloudy" insulins. 15. Remove the needle from the "cloudy" insulin vial. 16. Use an alcohol wipe to clean the area of the body to be injected. 17. Put the needle straight into the skin (90-degree angle). Put the needle in as far as it will go (to the hub). The needle may need to be injected at a 45-degree angle in small adults with little fat. 18. When the needle is in, you can let go of your skin. 19. Push the plunger down all the way to inject the insulin. 20. Pull the needle straight out of the skin. 21. Press the alcohol wipe over the spot where you gave your injection. Keep it there for a few seconds. Do not rub the area. 22. Do not put the plastic cover back on the needle. USING INSULIN PENS 1. Wash your hands with soap and water. 2. If you are using the "cloudy" insulin, roll the pen between your palms several times or rotate the pen top to bottom several times. 3. Remove the insulin pen cap. 4. Clean the rubber stopper of the cartridge with an alcohol wipe. 5. Remove the protective paper tab from the disposable needle. 6. Screw the needle onto the pen. 7. Remove the outer plastic needle cover. 8. Remove the inner plastic needle cover. 9. Prime the insulin pen by turning the button (dial) to 2 units. Hold the pen with the needle pointing up, and push the dial on the opposite end until a drop of  insulin appears at the needle tip. If no insulin appears, repeat this step. 10. Dial the number of units of insulin you will inject. 11. Use an alcohol wipe to clean the area of the body to be injected. 12. Pinch up 1 inch of skin and hold it. 13. Put the needle straight into the skin (90-degree angle). 14. Push the dial down to push the insulin into the fat tissue. 15. Count to 10 slowly. Then, remove the needle from the fat tissue. 16. Carefully replace the larger outer plastic needle cover over the needle and  unscrew the capped needle. THROWING AWAY SUPPLIES  Discard used needles in a puncture proof sharps disposal container. Follow disposal regulations for the area where you live.  Vials and empty disposable pens may be thrown away in the reg Hyperglycemia High blood sugar (hyperglycemia) means that the level of sugar in your blood is higher than it should be. Signs of high blood sugar include: Feeling thirsty. Frequent peeing (urinating). Feeling tired or sleepy. Dry mouth. Vision changes. Feeling weak. Feeling hungry but losing weight. Numbness and tingling in your hands or feet. Headache. When you ignore these signs, your blood sugar may keep going up. These problems may get worse, and other problems may begin. HOME CARE Check your blood sugars as told by your doctor. Write down the numbers with the date and time. Take the right amount of insulin or diabetes pills at the right time. Write down the dose with date and time. Refill your insulin or diabetes pills before running out. Watch what you eat. Follow your meal plan. Drink liquids without sugar, such as water. Check with your doctor if you have kidney or heart disease. Follow your doctor's orders for exercise. Exercise at the same time of day. Keep your doctor's appointments. GET HELP RIGHT AWAY IF:  You have trouble thinking or are confused. You have fast breathing with fruity smelling breath. You pass out  (faint). You have 2 to 3 days of high blood sugars and you do not know why. You have chest pain. You are feeling sick to your stomach (nauseous) or throwing up (vomiting). You have sudden vision changes. MAKE SURE YOU:  Understand these instructions. Will watch your condition. Will get help right away if you are not doing well or get worse.   This information is not intended to replace advice given to you by your health care provider. Make sure you discuss any questions you have with your health care provider.   Document Released: 07/18/2009 Document Revised: 10/11/2014 Document Reviewed: 05/27/2015 Elsevier Interactive Patient Education 2016 [employer].  [employer].   This information is not intended to replace advice given to you by your health care provider. Make sure you discuss any questions you have with your health care provider.   Document Released: 12/11/2003 Document Revised: 10/11/2014 Document Reviewed: 02/27/2013 Elsevier Interactive Patient Education [employer].

## 2015-11-11 NOTE — ED Provider Notes (Signed)
CSN: [id number]     Arrival date & time 11/11/15  1752 History   First MD Initiated Contact with Patient 11/11/15 2152     Chief Complaint  Patient presents with  . Hyperglycemia   HPI Charles Parker is a 70 y.o. male that presented to the ED from his PCPs office with hyperglycemia.  Patient reports that he was seen at his PCPs office for routine care today.  He had labs done and was called around 4pm today and told to go to the ED for elevated blood glucose.  Patient reports that he has not been checking his BG for over a week because he ran out of supplies.  He notes that he takes several medications for diabetes but that they are all oral.  He does NOT use insulin, though we discussed that it appears his PCP has prescribed this since Jan of last year.  He was unaware that he was supposed to be taking insulin.  He endorses polydipsia and polyuria.  He denies fatigue, headache, confusion, abdominal pain, nausea or vomiting.  He notes that he has been "shaky" intermittently.  He has had tremors with illness in the past.  Past Medical History  Diagnosis Date  . Hypertension   . Diabetes mellitus     type II  . Tubulovillous adenoma   . Hypercholesterolemia   . Cancer [facility])     h/o stomach CA   Past Surgical History  Procedure Laterality Date  . Knee surgery  2004    left  . Colonoscopy  02/2013    polyp removal(mult.)  . Proctoscopy  02/03/2012    Procedure: PROCTOSCOPY;  Surgeon: Adin Hector, MD;  Location: [facility];  Service: General;;  . Bladder neck reconstruction  02/03/2012    Procedure: BLADDER NECK REPAIR;  Surgeon: Adin Hector, MD;  Location: [facility];  Service: General;;  . Stomach surgery  02/2012   Family History  Problem Relation Age of Onset  . Hypertension Mother   . Diabetes Mother    Social History  Substance Use Topics  . Smoking status: Never Smoker   . Smokeless tobacco: Never Used  . Alcohol Use: No    Review of Systems  Constitutional: Negative for  diaphoresis.  Cardiovascular: Negative for chest pain.  Gastrointestinal: Negative for nausea, vomiting and abdominal pain.  Endocrine: Positive for polydipsia and polyuria.  Neurological: Positive for tremors. Negative for dizziness, weakness, light-headedness and headaches.   Allergies  Metformin and related and Codeine  Home Medications   Prior to Admission medications   Medication Sig Start Date End Date Taking? Authorizing Provider  Alcohol Swabs (B-D SINGLE USE SWABS REGULAR) PADS Use as directed Dx E11.9 07/16/15  Yes Janith Lima, MD  Blood Glucose Monitoring Suppl (TRUE METRIX METER) W/DEVICE KIT Use as directed  Dx E11.9 07/16/15  Yes Janith Lima, MD  canagliflozin [employer]) 300 MG TABS tablet Take 300 mg by mouth daily before breakfast. 07/14/15  Yes Janith Lima, MD  ezetimibe (ZETIA) 10 MG tablet Take 1 tablet (10 mg total) by mouth daily. 08/01/15  Yes Janith Lima, MD  glimepiride (AMARYL) 2 MG tablet TK 1 T D BEFORE BREAKFAST 10/23/15  Yes Historical Provider, MD  glucose blood (TRUE METRIX BLOOD GLUCOSE TEST) test strip 1 each by Other route 2 (two) times daily. Use to check blood sugars twice a day Dx E11.9 07/16/15  Yes Janith Lima, MD  Insulin Pen Needle (NOVOFINE) 32G X 6  MM MISC 1 Act by Does not apply route daily. Use QD 07/14/15  Yes Janith Lima, MD  losartan-hydrochlorothiazide (HYZAAR) 100-12.5 MG tablet Take 1 tablet by mouth daily. 08/16/15  Yes Janith Lima, MD  rivaroxaban (XARELTO) 20 MG TABS tablet Take 1 tablet (20 mg total) by mouth daily with supper. 08/01/15  Yes Janith Lima, MD  simvastatin (ZOCOR) 40 MG tablet Take 1 tablet (40 mg total) by mouth every evening. 07/14/15  Yes Janith Lima, MD  sitaGLIPtin (JANUVIA) 100 MG tablet Take 1 tablet (100 mg total) by mouth daily. 12/25/14  Yes Janith Lima, MD  TRUEPLUS LANCETS 33G MISC Use to help check blood sugars twice a day Dx E11.9 07/16/15  Yes Janith Lima, MD   BP 125/81  mmHg  Pulse 89  Temp(Src) 97.8 F (36.6 C) (Oral)  Resp 18  SpO2 94% Physical Exam  Constitutional: He appears well-nourished. No distress.  HENT:  Mouth/Throat: No oropharyngeal exudate.  MM mildly dry  Eyes: Conjunctivae are normal. No scleral icterus.  Neck: Normal range of motion. Neck supple.  Cardiovascular: Normal rate, regular rhythm, normal heart sounds and intact distal pulses.   No murmur heard. Pulmonary/Chest: Effort normal and breath sounds normal. No respiratory distress. He has no wheezes.  Abdominal: Soft. Bowel sounds are normal. He exhibits no distension and no mass. There is no tenderness.  Musculoskeletal: Normal range of motion. He exhibits no edema.  Neurological: He is alert.  No resting tremor appreciated  Skin: Skin is warm. He is not diaphoretic.  Psychiatric:  Seemingly decreased cognitive ability   ED Course  Procedures (including critical care time) Labs Review Labs Reviewed  CBG MONITORING, ED - Abnormal; Notable for the following:    Glucose-Capillary 514 (*)    All other components within normal limits   Urinalysis    Component Value Date/Time   COLORURINE YELLOW 11/11/2015 1437   APPEARANCEUR Sl Cloudy* 11/11/2015 1437   LABSPEC 1.010 11/11/2015 1437   PHURINE 5.5 11/11/2015 1437   GLUCOSEU >=1000* 11/11/2015 1437   GLUCOSEU >1000* 04/17/2010 1148   HGBUR NEGATIVE 11/11/2015 1437   BILIRUBINUR NEGATIVE 11/11/2015 1437   KETONESUR NEGATIVE 11/11/2015 1437   PROTEINUR NEGATIVE 04/17/2010 1148   UROBILINOGEN 0.2 11/11/2015 1437   NITRITE POSITIVE* 11/11/2015 1437   LEUKOCYTESUR NEGATIVE 11/11/2015 1437   Imaging Review No results found. I have personally reviewed and evaluated these images and lab results as part of my medical decision-making.   EKG Interpretation None      MDM   Final diagnoses:  None   2200: CBG, BMET, UA reviewed.  Calc serum osm 293.  Corrected Na 135.  Bicarb normal.  No ketones on UA.  No evidence of  DKA or HHS, though very close to HHS levels initially.  Repeat CBG 335 without insulin.  Will give 1L bolus NS and 7 units of Novolog sub-q and recheck in 1 hour.  2339: Checked in on patient.  He is resting comfortably in bed.  Received 7 units of Novolog at 2312.  Will await repeat CBG.  If continues to normalize can be discharged home with close follow up with PCP.  Charles Parker is a 70 y.o. male that presented to ED for hyperglycemia.  No evidence of DKA.  Does not appear to be in HHS.  Patient apparently unaware that he was to be taking insulin at home, though after reviewing his medical records, he was prescribed this back in January  2016, with multiple refills since.  There seems to be a misunderstanding regarding what meds he should be taking.  We discussed that he should contact his PCP for clarification in the am, as well as contact his pharmacy to check if medications are covered by insurance.  Both patient and his brother voice good understanding.  2350: Patient signed out to [place], [place].  Patient to be discharged home pending 0015 CBG.  Janora Norlander, DO 11/11/15 [place], MD 11/12/15 [phone]

## 2015-11-11 NOTE — ED Notes (Signed)
Pt admits to not testing his blood sugar often nor has he been eating well.

## 2015-11-11 NOTE — Patient Instructions (Signed)

## 2015-11-11 NOTE — Telephone Encounter (Signed)
Per shenka/lab, critical lab value--glucose at 538---advised dr jones--routing to dr [name], please advise, thanks

## 2015-11-12 ENCOUNTER — Telehealth: Payer: Self-pay | Admitting: Internal Medicine

## 2015-11-12 DIAGNOSIS — E11311 Type 2 diabetes mellitus with unspecified diabetic retinopathy with macular edema: Secondary | ICD-10-CM

## 2015-11-12 LAB — CBG MONITORING, ED: Glucose-Capillary: 316 mg/dL — ABNORMAL HIGH (ref 65–99)

## 2015-11-12 NOTE — Telephone Encounter (Signed)
That is better I have sent a referral for him to see ENDO urgently

## 2015-11-12 NOTE — Telephone Encounter (Signed)
States Dr. [name] sent him to the hospital yesterday because of his blood sugar.  The hospital got his blood sugar from 510 to 315.  He checked it again this morning and it was 315 again.  Hospital wanted him to send information to Dr. [name].

## 2015-11-12 NOTE — Telephone Encounter (Signed)
Routing as fyi

## 2015-11-13 ENCOUNTER — Telehealth: Payer: Self-pay | Admitting: Neurology

## 2015-11-13 ENCOUNTER — Ambulatory Visit (INDEPENDENT_AMBULATORY_CARE_PROVIDER_SITE_OTHER): Payer: Commercial Managed Care - HMO | Admitting: Neurology

## 2015-11-13 ENCOUNTER — Encounter: Payer: Self-pay | Admitting: Neurology

## 2015-11-13 ENCOUNTER — Other Ambulatory Visit (INDEPENDENT_AMBULATORY_CARE_PROVIDER_SITE_OTHER): Payer: Commercial Managed Care - HMO

## 2015-11-13 VITALS — BP 148/80 | HR 88 | Ht 72.0 in | Wt 187.0 lb

## 2015-11-13 DIAGNOSIS — E538 Deficiency of other specified B group vitamins: Secondary | ICD-10-CM

## 2015-11-13 DIAGNOSIS — R251 Tremor, unspecified: Secondary | ICD-10-CM | POA: Diagnosis not present

## 2015-11-13 LAB — VITAMIN B12: VITAMIN B 12: 850 pg/mL (ref 211–911)

## 2015-11-13 NOTE — Telephone Encounter (Signed)
Tried to call patient with no answer. Will try again later 

## 2015-11-13 NOTE — Patient Instructions (Signed)
1. Your provider has requested that you have labwork completed today. Please go to [facility] ([id number]) on the second floor of this building before leaving the office today. You do not need to check in. If you are not called within 15 minutes please check with the front desk.   

## 2015-11-13 NOTE — Progress Notes (Signed)
Charles Parker was seen today in the movement disorders clinic for neurologic consultation at the request of Scarlette Calico, MD.    The patient is seen today in neurologic consultation for tremor and to rule out possible Parkinson's disease.  The patient has a history of uncontrolled diabetes mellitus, and has been noncompliant with medication.  His last hemoglobin A1c was 12.7 (13.6 prior to that).  Pt states that he only had one episode of tremor/shaking ever and that was when he was "cold and there was snow on the ground."   States that it did seem to last a few hours after he got back in the house.   He otherwise denies any tremor.    Can eat without difficulty.  Denies significant caffeine use    Other Specific Sx's: Voice: no change Sleep: sleeps well except nocturia  Vivid Dreams:  Yes.    Acting out dreams:  No. Wet Pillows: No. Postural symptoms:  Yes.  , only if forgets to take meds  Falls?  No. Bradykinesia symptoms: pushes out of a chair Loss of smell:  No. Loss of taste:  No. Urinary Incontinence:  No. Difficulty Swallowing:  No. Handwriting, micrographia: No. Trouble with ADL's:  No.  Trouble buttoning clothing: No. Depression:  No. Memory changes:  No. Hallucinations:  No.  visual distortions: No. N/V:  no Lightheaded:  Yes.  , only when hasn't taken medication  Syncope: No. Diplopia: rarely Dyskinesia:  No.   ALLERGIES:   Allergies  Allergen Reactions  . Metformin And Related     diarrhea  . Codeine Rash    All over the body    CURRENT MEDICATIONS:  Outpatient Encounter Prescriptions as of 11/13/2015  Medication Sig  . canagliflozin (INVOKANA) 300 MG TABS tablet Take 300 mg by mouth daily before breakfast.  . ezetimibe (ZETIA) 10 MG tablet Take 1 tablet (10 mg total) by mouth daily.  Marland Kitchen glimepiride (AMARYL) 2 MG tablet TK 1 T D BEFORE BREAKFAST  . losartan-hydrochlorothiazide (HYZAAR) 100-12.5 MG tablet Take 1 tablet by mouth daily.  . rivaroxaban  (XARELTO) 20 MG TABS tablet Take 1 tablet (20 mg total) by mouth daily with supper.  . simvastatin (ZOCOR) 40 MG tablet Take 1 tablet (40 mg total) by mouth every evening.  . sitaGLIPtin (JANUVIA) 100 MG tablet Take 1 tablet (100 mg total) by mouth daily.  . Alcohol Swabs (B-D SINGLE USE SWABS REGULAR) PADS Use as directed Dx E11.9 (Patient not taking: Reported on 11/13/2015)  . Blood Glucose Monitoring Suppl (TRUE METRIX METER) W/DEVICE KIT Use as directed  Dx E11.9 (Patient not taking: Reported on 11/13/2015)  . glucose blood (TRUE METRIX BLOOD GLUCOSE TEST) test strip 1 each by Other route 2 (two) times daily. Use to check blood sugars twice a day Dx E11.9 (Patient not taking: Reported on 11/13/2015)  . Insulin Pen Needle (NOVOFINE) 32G X 6 MM MISC 1 Act by Does not apply route daily. Use QD (Patient not taking: Reported on 11/13/2015)  . TRUEPLUS LANCETS 33G MISC Use to help check blood sugars twice a day Dx E11.9 (Patient not taking: Reported on 11/13/2015)   No facility-administered encounter medications on file as of 11/13/2015.    PAST MEDICAL HISTORY:   Past Medical History  Diagnosis Date  . Hypertension   . Diabetes mellitus     type II  . Tubulovillous adenoma   . Hypercholesterolemia     PAST SURGICAL HISTORY:   Past Surgical History  Procedure  Laterality Date  . Knee surgery  2004    left  . Colonoscopy  02/2013    polyp removal(mult.)  . Proctoscopy  02/03/2012    Procedure: PROCTOSCOPY;  Surgeon: Adin Hector, MD;  Location: [facility];  Service: General;;  . Bladder neck reconstruction  02/03/2012    Procedure: BLADDER NECK REPAIR;  Surgeon: Adin Hector, MD;  Location: [facility];  Service: General;;  . Stomach surgery  02/2012    SOCIAL HISTORY:   Social History   Social History  . Marital Status: Widowed    Spouse Name: N/A  . Number of Children: 1  . Years of Education: 12   Occupational History  . retired     retired   Social History Main Topics  . Smoking  status: Never Smoker   . Smokeless tobacco: Never Used  . Alcohol Use: 0.0 oz/week    0 Standard drinks or equivalent per week     Comment: once every two months  . Drug Use: No  . Sexual Activity: Not Currently   Other Topics Concern  . Not on file   Social History Narrative   Patient lives at home alone.. Patient is retired. Patient has high school education.   Right handed.- Both   Caffeine- Coffee and soda    FAMILY HISTORY:   Family Status  Relation Status Death Age  . Mother Deceased 28    HTN, DM  . Father Deceased     prostate cancer  . Brother Alive     healthy  . Brother Alive     healthy    ROS:  A complete 10 system review of systems was obtained and was unremarkable apart from what is mentioned above.  PHYSICAL EXAMINATION:    VITALS:   Filed Vitals:   11/13/15 0915  BP: 148/80  Pulse: 88  Height: 6' (1.829 m)  Weight: 187 lb (84.823 kg)    GEN:  The patient appears stated age and is in NAD. HEENT:  Normocephalic, atraumatic.  The mucous membranes are moist. The superficial temporal arteries are without ropiness or tenderness. CV:  RRR Lungs:  CTAB Neck/HEME:  There are no carotid bruits bilaterally.  Neurological examination:  Orientation: The patient is alert and oriented x3. Fund of knowledge is appropriate.  Recent and remote memory are intact.  Attention and concentration are normal.    Able to name objects and repeat phrases.  A complete MMSE was performed and the patient scored 30/30. Cranial nerves: There is good facial symmetry. Pupils are equal round and reactive to light bilaterally. Fundoscopic exam reveals clear margins bilaterally. Extraocular muscles are intact. The visual fields are full to confrontational testing. The speech is fluent and clear. Soft palate rises symmetrically and there is no tongue deviation. Hearing is intact to conversational tone. Sensation: Sensation is intact to light and pinprick throughout (facial, trunk,  extremities). Vibration is intact at the bilateral big toe but decreased c/t UE. There is no extinction with double simultaneous stimulation. There is no sensory dermatomal level identified. Motor: Strength is 5/5 in the bilateral upper and lower extremities.   Shoulder shrug is equal and symmetric.  There is no pronator drift. Deep tendon reflexes: Deep tendon reflexes are 2+/4 at the bilateral biceps, triceps, brachioradialis, patella and achilles. Plantar responses are downgoing bilaterally.  Movement examination: Tone: There is normal tone in the bilateral upper extremities.  The tone in the lower extremities is normal.  Abnormal movements: none Coordination:  There  is decremation with RAM's, with any form of RAMS, including alternating supination and pronation of the forearm, hand opening and closing, finger taps, heel taps and toe taps. Gait and Station: The patient has no difficulty arising out of a deep-seated chair without the use of the hands. The patient's stride length is normal but wide based and he slightly drags the right leg.  He has trouble ambulating in a tandem fashion.  He can stand in the romberg position with eyes open and closed.     LABS  Lab Results  Component Value Date   TSH 1.83 11/01/2014   Lab Results  Component Value Date   VITAMINB12 291 06/20/2013     ASSESSMENT/PLAN:  1.  Tremor  -I saw no evidence of tremor today.  No evidence of any neurodegenerative process.  I do agree that tremor could be caused by uncontrolled DM but he really denies much tremor. 2.  Diabetic peripheral neuropathy  -has evidence of this on examination.  Talked about extreme importance of diet control and talked about what a proper diet looks like  -will recheck b12 as it was low a few years ago. 3.  Follow up prn    ;

## 2015-11-13 NOTE — Telephone Encounter (Signed)
-----   Message from [place], DO sent at 11/13/2015  1:31 PM EST ----- Let pt know that B12 looked great

## 2015-11-13 NOTE — Telephone Encounter (Signed)
Patient made aware lab normal.

## 2015-11-14 NOTE — Telephone Encounter (Signed)
Attempted to call pt. I see he has an appt on 12/09/15 trying to see if he can be seen sooner as this is an urgent matter

## 2015-11-14 NOTE — Telephone Encounter (Signed)
Second attempt

## 2015-11-18 ENCOUNTER — Other Ambulatory Visit: Payer: Self-pay

## 2015-11-18 NOTE — Patient Outreach (Signed)
Making referral to Disease Management for continued diabetic education.  Per chart, patient was seen on 2/09 by neurologist and his HgA1C was reported at 12.7  Plan: Send referral to [facility] CM to refer to appropriate Health Coach for further diabetic education

## 2015-11-20 ENCOUNTER — Other Ambulatory Visit: Payer: Self-pay | Admitting: Licensed Clinical Social Worker

## 2015-11-20 ENCOUNTER — Encounter: Payer: Self-pay | Admitting: Licensed Clinical Social Worker

## 2015-11-20 NOTE — Patient Outreach (Signed)
[facility]) Care Management  11/20/2015  Charles Parker [birth year] [id number]   Assessment-CSW has sent outreach barrier letter to patient and has waited over 10 business days to hear back. CSW has not heard back from patient. CSW completed one additional outreach to patient but was unable to reach him successfully. CSW will complete social work discharge at this time.  Plan-CSW will update [name] of discharge. CSW will complete social work discharge. CSW will mail out discharge letters to both patient and PCP.  [name], BSW, [email or web address], [email or web address] Phone: [phone] Fax: [phone]

## 2015-11-21 ENCOUNTER — Other Ambulatory Visit: Payer: Self-pay

## 2015-11-21 DIAGNOSIS — E1159 Type 2 diabetes mellitus with other circulatory complications: Secondary | ICD-10-CM

## 2015-12-03 ENCOUNTER — Other Ambulatory Visit: Payer: Self-pay | Admitting: *Deleted

## 2015-12-03 NOTE — Patient Outreach (Signed)
[facility]) Care Management  12/03/2015  TRINA WINNIE [birth year] [id number]   [place] telephone call to patient.  [place] compliance verified. RN Health Coach  introduced self to patient. Patient was very agreeable to follow up out reach calls. Patient was referred by Loni Muse  Plan: [place] will do follow up call within a month for assessment. Patient agreed to follow up call  [name], BSN, [place] Management RN Health Coach Phone: [place] complies with applicable Federal civil rights laws and does not discriminate on the basis of race, color, national origin, age, disability, or sex. Espaol (Spanish)  [place] cumple con las leyes federales de derechos civiles aplicables y no discrimina por motivos de raza, color, nacionalidad, edad, discapacidad o sexo.    [place] ([place]  [place] tun th? lu?t dn quy?n hi?n hnh c?a Lin bang v khng phn bi?t ?i x? d?a trn ch?ng t?c, mu da, ngu?n g?c qu?c gia, ? tu?i, khuy?t t?t, ho?c gi?i tnh.    (Arabic)    [place]                      .

## 2015-12-09 ENCOUNTER — Ambulatory Visit (INDEPENDENT_AMBULATORY_CARE_PROVIDER_SITE_OTHER): Payer: Commercial Managed Care - HMO | Admitting: Internal Medicine

## 2015-12-09 ENCOUNTER — Encounter: Payer: Self-pay | Admitting: Internal Medicine

## 2015-12-09 ENCOUNTER — Other Ambulatory Visit: Payer: Self-pay | Admitting: Internal Medicine

## 2015-12-09 VITALS — BP 122/80 | HR 75 | Temp 98.1°F | Resp 12 | Wt 189.0 lb

## 2015-12-09 DIAGNOSIS — E11319 Type 2 diabetes mellitus with unspecified diabetic retinopathy without macular edema: Secondary | ICD-10-CM | POA: Diagnosis not present

## 2015-12-09 MED ORDER — GLIMEPIRIDE 2 MG PO TABS: 2.0000 mg | ORAL_TABLET | Freq: Two times a day (BID) | ORAL | Status: DC

## 2015-12-09 MED ORDER — INSULIN GLARGINE 100 UNIT/ML SOLOSTAR PEN: 12.0000 [IU] | PEN_INJECTOR | Freq: Every day | SUBCUTANEOUS | Status: DC

## 2015-12-09 MED ORDER — INSULIN PEN NEEDLE 32G X 4 MM MISC: Status: DC

## 2015-12-09 NOTE — Patient Instructions (Addendum)
Please start Lantus 12 units at bedtime. In 3 days, if sugars in the morning are not <150, please increase Lantus to 15 units at bedtime.  When injecting insulin:  Inject in the abdomen  Rotate the injection sites around the belly button  Change needle for each injection  Keep needle in for 10 sec after last unit of insulin in  Keep the insulin in use out of the fridge  Please increase Amaryl to 2 mg 2x a day, 15 minutes before breakfast and before dinner.  Please return in 1-1.5 months with your sugar log.

## 2015-12-09 NOTE — Progress Notes (Signed)
Patient ID: Charles Parker, male   DOB: 1945-12-12, 70 y.o.   MRN: [id number]  HPI: Charles Parker is a 70 y.o.-year-old male, returning for f/u for DM2, dx in 2006, uncontrolled, with complications (+ DR with macular degeneration, + CKD). Last visit 5 mo ago.   Last hemoglobin A1c was: Lab Results  Component Value Date   HGBA1C 12.7* 11/11/2015   HGBA1C 13.6* 06/18/2015   HGBA1C 9.5* 03/04/2015   before last hemoglobin A1c, he was out of his diabetes medicines x 4 days.  Pt is on a regimen of: - Amaryl 2 mg in a.m. Stopped Invokana 300 mg in a.m. ($31) >> not taking it b/c price He was started on Toujeo 50 units at bedtime - added recently by PCP after last hemoglobin A1c returned high >> pt tells me he was not aware of this >> did not start.  He stopped Januvia 100 mg in a.m. - expensive He was on Metformin >> diarrhea.  Pt checks his sugars 2-4 a day and they are MUCH higher (per log): - am: 160-201 >> 52x1, 89-133  >> 150, 280-400 - 2h after b'fast: n/c  - before lunch: n/c >> 270-400 - 2h after lunch: n/c - before dinner: 135-140 >> 92-145 >> 290-420 - 2h after dinner: 145 >> n/c  - bedtime: n/c >> 190-400 - nighttime: n/c No lows. Lowest sugar was 135 >> 52x1 >> 150; he has hypoglycemia awareness at 90.  Highest sugar was 303 >> 150 >> 400.  Glucometer: TruTest  Pt's meals are: - Breakfast: biscuit  - Lunch: [place] meat - Dinner: Hungry man or PB and jelly or green beans, kale - Snacks: banana, cake, peppermint candy  - no CKD, last BUN/creatinine:  Lab Results  Component Value Date   BUN 20 11/11/2015   CREATININE 1.28 11/11/2015   on valsartan. - last set of lipids: Lab Results  Component Value Date   CHOL 195 11/11/2015   HDL 43.30 11/11/2015   LDLCALC 151* 06/18/2015   LDLDIRECT 118.0 11/11/2015   TRIG 210.0* 11/11/2015   CHOLHDL 4 11/11/2015   on Zetia, WelChol, Zocor. - last eye exam was in 02/2015. + DR. + cataracts, glaucoma.  - + numbness and  tingling in his feet.  ROS: Constitutional: no weight gain/loss, no fatigue, no subjective hyperthermia/hypothermia, + nocturia Eyes: no blurry vision, no xerophthalmia ENT: no sore throat, no nodules palpated in throat, no dysphagia/odynophagia, no hoarseness Cardiovascular: no CP/SOB/palpitations/leg swelling Respiratory: no cough/SOB Gastrointestinal: no N/V/D/C Musculoskeletal: no muscle/joint aches Skin: no rashes Neurological: no tremors/numbness/tingling/dizziness  I reviewed pt's medications, allergies, PMH, social hx, family hx, and changes were documented in the history of present illness. Otherwise, unchanged from my initial visit note.  Past Medical History  Diagnosis Date  . Hypertension   . Diabetes mellitus     type II  . Tubulovillous adenoma   . Hypercholesterolemia    Past Surgical History  Procedure Laterality Date  . Knee surgery  2004    left  . Colonoscopy  02/2013    polyp removal(mult.)  . Proctoscopy  02/03/2012    Procedure: PROCTOSCOPY;  Surgeon: Charles Hector, MD;  Location: [facility];  Service: General;;  . Bladder neck reconstruction  02/03/2012    Procedure: BLADDER NECK REPAIR;  Surgeon: Charles Hector, MD;  Location: [facility];  Service: General;;  . Stomach surgery  02/2012   Social History   Social History  . Marital Status: Widowed    Spouse  Name: N/A  . Number of Children: 1  . Years of Education: 12   Occupational History  . retired     Financial controller   Social History Main Topics  . Smoking status: Never Smoker   . Smokeless tobacco: Never Used  . Alcohol Use: 0.0 oz/week    0 Standard drinks or equivalent per week     Comment: once every two months  . Drug Use: No  . Sexual Activity: Not Currently   Other Topics Concern  . Not on file   Social History Narrative   Patient lives at home alone.. Patient is retired. Patient has high school education.   Right handed.- Both   Caffeine- Coffee and soda   Current Outpatient  Prescriptions on File Prior to Visit  Medication Sig Dispense Refill  . Alcohol Swabs (B-D SINGLE USE SWABS REGULAR) PADS Use as directed Dx E11.9 (Patient not taking: Reported on 11/13/2015) 180 each 3  . Blood Glucose Monitoring Suppl (TRUE METRIX METER) W/DEVICE KIT Use as directed  Dx E11.9 (Patient not taking: Reported on 11/13/2015) 1 kit 0  . canagliflozin (INVOKANA) 300 MG TABS tablet Take 300 mg by mouth daily before breakfast. 90 tablet 3  . ezetimibe (ZETIA) 10 MG tablet Take 1 tablet (10 mg total) by mouth daily. 90 tablet 3  . glimepiride (AMARYL) 2 MG tablet TK 1 T D BEFORE BREAKFAST  0  . glucose blood (TRUE METRIX BLOOD GLUCOSE TEST) test strip 1 each by Other route 2 (two) times daily. Use to check blood sugars twice a day Dx E11.9 (Patient not taking: Reported on 11/13/2015) 180 each 3  . Insulin Pen Needle (NOVOFINE) 32G X 6 MM MISC 1 Act by Does not apply route daily. Use QD (Patient not taking: Reported on 11/13/2015) 100 each 3  . losartan-hydrochlorothiazide (HYZAAR) 100-12.5 MG tablet Take 1 tablet by mouth daily. 90 tablet 3  . rivaroxaban (XARELTO) 20 MG TABS tablet Take 1 tablet (20 mg total) by mouth daily with supper. 90 tablet 3  . simvastatin (ZOCOR) 40 MG tablet Take 1 tablet (40 mg total) by mouth every evening. 90 tablet 3  . sitaGLIPtin (JANUVIA) 100 MG tablet Take 1 tablet (100 mg total) by mouth daily. 90 tablet 3  . TRUEPLUS LANCETS 33G MISC Use to help check blood sugars twice a day Dx E11.9 (Patient not taking: Reported on 11/13/2015) 180 each 3   No current facility-administered medications on file prior to visit.   Allergies  Allergen Reactions  . Metformin And Related     diarrhea  . Codeine Rash    All over the body   Family History  Problem Relation Age of Onset  . Hypertension Mother   . Diabetes Mother    PE: BP 122/80 mmHg  Pulse 75  Temp(Src) 98.1 F (36.7 C) (Oral)  Resp 12  Wt 189 lb (85.73 kg)  SpO2 98% Body mass index is 25.63  kg/(m^2). Wt Readings from Last 3 Encounters:  12/09/15 189 lb (85.73 kg)  11/13/15 187 lb (84.823 kg)  11/11/15 189 lb (85.73 kg)   Constitutional: normal weight, in NAD Eyes: PERRLA, EOMI, no exophthalmos ENT: moist mucous membranes, no thyromegaly, no cervical lymphadenopathy Cardiovascular: RRR, No MRG Respiratory: CTA B Gastrointestinal: abdomen soft, NT, ND, BS+ Musculoskeletal: no deformities, strength intact in all 4 Skin: moist, warm, no rashes Neurological: no tremor with outstretched hands, DTR normal in all 4  ASSESSMENT: 1. DM2, insulin-independent, uncontrolled, with complications - DR with  macular degeneration - CKD  PLAN:  1. Patient with long-standing, uncontrolled diabetes, on oral antidiabetic regimen, with his diabetes control greatly deteriorating since last visit after he stopped the Invokana. His last hemoglobin A1c was very high at last visit, and remained high >> we need to start Basal insulin. Explained correct inj technique and demonstrated pen use. Will also increase Amaryl to bid. - I suggested to:  Patient Instructions  Please start Lantus 12 units at bedtime. In 3 days, if sugars in the morning are not <150, please increase Lantus to 15 units at bedtime.  When injecting insulin:  Inject in the abdomen  Rotate the injection sites around the belly button  Change needle for each injection  Keep needle in for 10 sec after last unit of insulin in  Keep the insulin in use out of the fridge  Please increase Amaryl to 2 mg 2x a day, 15 minutes before breakfast and before dinner.  Please return in 1-1.5 months with your sugar log.   - continue to check at different times of the day - check 2 times a day, rotating checks - given new sugar logs - advised for yearly eye exams >> he is up-to-date - He already had the flu shot this season - Return to clinic in 1-1.5 mo with sugar log

## 2015-12-10 ENCOUNTER — Ambulatory Visit (INDEPENDENT_AMBULATORY_CARE_PROVIDER_SITE_OTHER): Payer: Commercial Managed Care - HMO | Admitting: Podiatry

## 2015-12-10 DIAGNOSIS — E119 Type 2 diabetes mellitus without complications: Secondary | ICD-10-CM | POA: Diagnosis not present

## 2015-12-10 DIAGNOSIS — B351 Tinea unguium: Secondary | ICD-10-CM

## 2015-12-10 DIAGNOSIS — M79675 Pain in left toe(s): Secondary | ICD-10-CM

## 2015-12-10 DIAGNOSIS — M79674 Pain in right toe(s): Secondary | ICD-10-CM

## 2015-12-10 NOTE — Progress Notes (Signed)
   Subjective:    Patient ID: Charles Parker, male    DOB: May 29, 1946, 70 y.o.   MRN: [id number]  HPI This patient presents today complaining of elongated and thickened toenails and walking wearing shoes and is requesting toenail debridement. The toenails of become gradually more comfortable over time with the last visit in office on 09/24/2013. Patient is a known diabetic and denies any history of ulceration, claudication or amputation   Review of Systems  Skin: Positive for color change.       Objective:   Physical Exam  Orientated 3  Vascular: DP pulses 2/4 bilaterally PT pulses 2/4 bilaterally Capillary refill immediate bilaterally  Neurological: Sensation to 10 g monofilament wire intact 5/5 bilaterally Vibratory sensation reactive bilaterally Ankle reflex equal and reactive bilaterally  Dermatological: No open skin lesions bilaterally The toenails are extremely elongated, hypertrophic, deformed, discolored and tender direct palpation 6-10  Musculoskeletal: Limited right first MPJ motion bilaterally HAV deformities bilaterally Overlapping second toes bilaterally      Assessment & Plan:   Assessment: Diabetic with satisfactory neurovascular status Neglected symptomatic onychomycoses 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

## 2015-12-10 NOTE — Patient Instructions (Signed)
Day your diabetic foot screen demonstrated adequate pulse a shins and feeling in your feet Because you have large bunions on your right and left feet please were shoes with soft uppers to avoid irritation to the bunions Return at 4 month intervals for debridement of toenails  Diabetes and Foot Care Diabetes may cause you to have problems because of poor blood supply (circulation) to your feet and legs. This may cause the skin on your feet to become thinner, break easier, and heal more slowly. Your skin may become dry, and the skin may peel and crack. You may also have nerve damage in your legs and feet causing decreased feeling in them. You may not notice minor injuries to your feet that could lead to infections or more serious problems. Taking care of your feet is one of the most important things you can do for yourself.  HOME CARE INSTRUCTIONS  Wear shoes at all times, even in the house. Do not go barefoot. Bare feet are easily injured.  Check your feet daily for blisters, cuts, and redness. If you cannot see the bottom of your feet, use a mirror or ask someone for help.  Wash your feet with warm water (do not use hot water) and mild soap. Then pat your feet and the areas between your toes until they are completely dry. Do not soak your feet as this can dry your skin.  Apply a moisturizing lotion or petroleum jelly (that does not contain alcohol and is unscented) to the skin on your feet and to dry, brittle toenails. Do not apply lotion between your toes.  Trim your toenails straight across. Do not dig under them or around the cuticle. File the edges of your nails with an emery board or nail file.  Do not cut corns or calluses or try to remove them with medicine.  Wear clean socks or stockings every day. Make sure they are not too tight. Do not wear knee-high stockings since they may decrease blood flow to your legs.  Wear shoes that fit properly and have enough cushioning. To break in new  shoes, wear them for just a few hours a day. This prevents you from injuring your feet. Always look in your shoes before you put them on to be sure there are no objects inside.  Do not cross your legs. This may decrease the blood flow to your feet.  If you find a minor scrape, cut, or break in the skin on your feet, keep it and the skin around it clean and dry. These areas may be cleansed with mild soap and water. Do not cleanse the area with peroxide, alcohol, or iodine.  When you remove an adhesive bandage, be sure not to damage the skin around it.  If you have a wound, look at it several times a day to make sure it is healing.  Do not use heating pads or hot water bottles. They may burn your skin. If you have lost feeling in your feet or legs, you may not know it is happening until it is too late.  Make sure your health care provider performs a complete foot exam at least annually or more often if you have foot problems. Report any cuts, sores, or bruises to your health care provider immediately. SEEK MEDICAL CARE IF:   You have an injury that is not healing.  You have cuts or breaks in the skin.  You have an ingrown nail.  You notice redness on your legs  or feet.  You feel burning or tingling in your legs or feet.  You have pain or cramps in your legs and feet.  Your legs or feet are numb.  Your feet always feel cold. SEEK IMMEDIATE MEDICAL CARE IF:   There is increasing redness, swelling, or pain in or around a wound.  There is a red line that goes up your leg.  Pus is coming from a wound.  You develop a fever or as directed by your health care provider.  You notice a bad smell coming from an ulcer or wound.   This information is not intended to replace advice given to you by your health care provider. Make sure you discuss any questions you have with your health care provider.   Document Released: 09/17/2000 Document Revised: 05/23/2013 Document Reviewed:  02/27/2013 Elsevier Interactive Patient Education [employer].

## 2015-12-12 ENCOUNTER — Ambulatory Visit (INDEPENDENT_AMBULATORY_CARE_PROVIDER_SITE_OTHER): Payer: Commercial Managed Care - HMO | Admitting: Family

## 2015-12-12 ENCOUNTER — Encounter: Payer: Self-pay | Admitting: Family

## 2015-12-12 VITALS — BP 118/72 | HR 89 | Temp 97.9°F | Wt 188.2 lb

## 2015-12-12 DIAGNOSIS — M7582 Other shoulder lesions, left shoulder: Secondary | ICD-10-CM

## 2015-12-12 MED ORDER — DICLOFENAC SODIUM 2 % TD SOLN: 1.0000 "application " | Freq: Two times a day (BID) | TRANSDERMAL | Status: DC | PRN

## 2015-12-12 NOTE — Progress Notes (Signed)
Pre visit review using our clinic review tool, if applicable. No additional management support is needed unless otherwise documented below in the visit note. 

## 2015-12-12 NOTE — Assessment & Plan Note (Signed)
This is a new problem. Symptoms and exam consistent with rotator cuff tendinitis/bursitis. Treat conservatively with ice and home exercise therapy. Sample of [place] provided. Follow-up in 3 weeks or sooner if symptoms worsen or do not improve for further imaging and possible cortisone injection.

## 2015-12-12 NOTE — Patient Instructions (Signed)
Thank you for choosing [facility].  Summary/Instructions:  Ice 2-3 times per day and after activity. Tylenol as needed for discomfort.  Exercises daily.  Follow up in 2-3 weeks or sooner if needed.   If your symptoms worsen or fail to improve, please contact our office for further instruction, or in case of emergency go directly to the emergency room at the closest medical facility.    Impingement Syndrome, Rotator Cuff, Bursitis With Rehab Impingement syndrome is a condition that involves inflammation of the tendons of the rotator cuff and the subacromial bursa, that causes pain in the shoulder. The rotator cuff consists of four tendons and muscles that control much of the shoulder and upper arm function. The subacromial bursa is a fluid filled sac that helps reduce friction between the rotator cuff and one of the bones of the shoulder (acromion). Impingement syndrome is usually an overuse injury that causes swelling of the bursa (bursitis), swelling of the tendon (tendonitis), and/or a tear of the tendon (strain). Strains are classified into three categories. Grade 1 strains cause pain, but the tendon is not lengthened. Grade 2 strains include a lengthened ligament, due to the ligament being stretched or partially ruptured. With grade 2 strains there is still function, although the function may be decreased. Grade 3 strains include a complete tear of the tendon or muscle, and function is usually impaired. SYMPTOMS   Pain around the shoulder, often at the outer portion of the upper arm.  Pain that gets worse with shoulder function, especially when reaching overhead or lifting.  Sometimes, aching when not using the arm.  Pain that wakes you up at night.  Sometimes, tenderness, swelling, warmth, or redness over the affected area.  Loss of strength.  Limited motion of the shoulder, especially reaching behind the back (to the back pocket or to unhook bra) or across your  body.  Crackling sound (crepitation) when moving the arm.  Biceps tendon pain and inflammation (in the front of the shoulder). Worse when bending the elbow or lifting. CAUSES  Impingement syndrome is often an overuse injury, in which chronic (repetitive) motions cause the tendons or bursa to become inflamed. A strain occurs when a force is paced on the tendon or muscle that is greater than it can withstand. Common mechanisms of injury include: Stress from sudden increase in duration, frequency, or intensity of training.  Direct hit (trauma) to the shoulder.  Aging, erosion of the tendon with normal use.  Bony bump on shoulder (acromial spur). RISK INCREASES WITH:  Contact sports (football, wrestling, boxing).  Throwing sports (baseball, tennis, volleyball).  Weightlifting and bodybuilding.  Heavy labor.  Previous injury to the rotator cuff, including impingement.  Poor shoulder strength and flexibility.  Failure to warm up properly before activity.  Inadequate protective equipment.  Old age.  Bony bump on shoulder (acromial spur). PREVENTION   Warm up and stretch properly before activity.  Allow for adequate recovery between workouts.  Maintain physical fitness:  Strength, flexibility, and endurance.  Cardiovascular fitness.  Learn and use proper exercise technique. PROGNOSIS  If treated properly, impingement syndrome usually goes away within 6 weeks. Sometimes surgery is required.  RELATED COMPLICATIONS   Longer healing time if not properly treated, or if not given enough time to heal.  Recurring symptoms, that result in a chronic condition.  Shoulder stiffness, frozen shoulder, or loss of motion.  Rotator cuff tendon tear.  Recurring symptoms, especially if activity is resumed too soon, with overuse, with a direct  blow, or when using poor technique. TREATMENT  Treatment first involves the use of ice and medicine, to reduce pain and inflammation. The use  of strengthening and stretching exercises may help reduce pain with activity. These exercises may be performed at home or with a therapist. If non-surgical treatment is unsuccessful after more than 6 months, surgery may be advised. After surgery and rehabilitation, activity is usually possible in 3 months.  MEDICATION  If pain medicine is needed, nonsteroidal anti-inflammatory medicines (aspirin and ibuprofen), or other minor pain relievers (acetaminophen), are often advised.  Do not take pain medicine for 7 days before surgery.  Prescription pain relievers may be given, if your caregiver thinks they are needed. Use only as directed and only as much as you need.  Corticosteroid injections may be given by your caregiver. These injections should be reserved for the most serious cases, because they may only be given a certain number of times. HEAT AND COLD  Cold treatment (icing) should be applied for 10 to 15 minutes every 2 to 3 hours for inflammation and pain, and immediately after activity that aggravates your symptoms. Use ice packs or an ice massage.  Heat treatment may be used before performing stretching and strengthening activities prescribed by your caregiver, physical therapist, or athletic trainer. Use a heat pack or a warm water soak. SEEK MEDICAL CARE IF:   Symptoms get worse or do not improve in 4 to 6 weeks, despite treatment.  New, unexplained symptoms develop. (Drugs used in treatment may produce side effects.) EXERCISES  RANGE OF MOTION (ROM) AND STRETCHING EXERCISES - Impingement Syndrome (Rotator Cuff  Tendinitis, Bursitis) These exercises may help you when beginning to rehabilitate your injury. Your symptoms may go away with or without further involvement from your physician, physical therapist or athletic trainer. While completing these exercises, remember:   Restoring tissue flexibility helps normal motion to return to the joints. This allows healthier, less painful  movement and activity.  An effective stretch should be held for at least 30 seconds.  A stretch should never be painful. You should only feel a gentle lengthening or release in the stretched tissue. STRETCH - Flexion, Standing  Stand with good posture. With an underhand grip on your right / left hand, and an overhand grip on the opposite hand, grasp a broomstick or cane so that your hands are a little more than shoulder width apart.  Keeping your right / left elbow straight and shoulder muscles relaxed, push the stick with your opposite hand, to raise your right / left arm in front of your body and then overhead. Raise your arm until you feel a stretch in your right / left shoulder, but before you have increased shoulder pain.  Try to avoid shrugging your right / left shoulder as your arm rises, by keeping your shoulder blade tucked down and toward your mid-back spine. Hold for __________ seconds.  Slowly return to the starting position. Repeat __________ times. Complete this exercise __________ times per day. STRETCH - Abduction, Supine  Lie on your back. With an underhand grip on your right / left hand and an overhand grip on the opposite hand, grasp a broomstick or cane so that your hands are a little more than shoulder width apart.  Keeping your right / left elbow straight and your shoulder muscles relaxed, push the stick with your opposite hand, to raise your right / left arm out to the side of your body and then overhead. Raise your arm until you  feel a stretch in your right / left shoulder, but before you have increased shoulder pain.  Try to avoid shrugging your right / left shoulder as your arm rises, by keeping your shoulder blade tucked down and toward your mid-back spine. Hold for __________ seconds.  Slowly return to the starting position. Repeat __________ times. Complete this exercise __________ times per day. ROM - Flexion, Active-Assisted  Lie on your back. You may bend  your knees for comfort.  Grasp a broomstick or cane so your hands are about shoulder width apart. Your right / left hand should grip the end of the stick, so that your hand is positioned "thumbs-up," as if you were about to shake hands.  Using your healthy arm to lead, raise your right / left arm overhead, until you feel a gentle stretch in your shoulder. Hold for __________ seconds.  Use the stick to assist in returning your right / left arm to its starting position. Repeat __________ times. Complete this exercise __________ times per day.  ROM - Internal Rotation, Supine   Lie on your back on a firm surface. Place your right / left elbow about 60 degrees away from your side. Elevate your elbow with a folded towel, so that the elbow and shoulder are the same height.  Using a broomstick or cane and your strong arm, pull your right / left hand toward your body until you feel a gentle stretch, but no increase in your shoulder pain. Keep your shoulder and elbow in place throughout the exercise.  Hold for __________ seconds. Slowly return to the starting position. Repeat __________ times. Complete this exercise __________ times per day. STRETCH - Internal Rotation  Place your right / left hand behind your back, palm up.  Throw a towel or belt over your opposite shoulder. Grasp the towel with your right / left hand.  While keeping an upright posture, gently pull up on the towel, until you feel a stretch in the front of your right / left shoulder.  Avoid shrugging your right / left shoulder as your arm rises, by keeping your shoulder blade tucked down and toward your mid-back spine.  Hold for __________ seconds. Release the stretch, by lowering your healthy hand. Repeat __________ times. Complete this exercise __________ times per day. ROM - Internal Rotation   Using an underhand grip, grasp a stick behind your back with both hands.  While standing upright with good posture, slide the stick  up your back until you feel a mild stretch in the front of your shoulder.  Hold for __________ seconds. Slowly return to your starting position. Repeat __________ times. Complete this exercise __________ times per day.  STRETCH - Posterior Shoulder Capsule   Stand or sit with good posture. Grasp your right / left elbow and draw it across your chest, keeping it at the same height as your shoulder.  Pull your elbow, so your upper arm comes in closer to your chest. Pull until you feel a gentle stretch in the back of your shoulder.  Hold for __________ seconds. Repeat __________ times. Complete this exercise __________ times per day. STRENGTHENING EXERCISES - Impingement Syndrome (Rotator Cuff Tendinitis, Bursitis) These exercises may help you when beginning to rehabilitate your injury. They may resolve your symptoms with or without further involvement from your physician, physical therapist or athletic trainer. While completing these exercises, remember:  Muscles can gain both the endurance and the strength needed for everyday activities through controlled exercises.  Complete these exercises as  instructed by your physician, physical therapist or athletic trainer. Increase the resistance and repetitions only as guided.  You may experience muscle soreness or fatigue, but the pain or discomfort you are trying to eliminate should never worsen during these exercises. If this pain does get worse, stop and make sure you are following the directions exactly. If the pain is still present after adjustments, discontinue the exercise until you can discuss the trouble with your clinician.  During your recovery, avoid activity or exercises which involve actions that place your injured hand or elbow above your head or behind your back or head. These positions stress the tissues which you are trying to heal. STRENGTH - Scapular Depression and Adduction   With good posture, sit on a firm chair. Support your  arms in front of you, with pillows, arm rests, or on a table top. Have your elbows in line with the sides of your body.  Gently draw your shoulder blades down and toward your mid-back spine. Gradually increase the tension, without tensing the muscles along the top of your shoulders and the back of your neck.  Hold for __________ seconds. Slowly release the tension and relax your muscles completely before starting the next repetition.  After you have practiced this exercise, remove the arm support and complete the exercise in standing as well as sitting position. Repeat __________ times. Complete this exercise __________ times per day.  STRENGTH - Shoulder Abductors, Isometric  With good posture, stand or sit about 4-6 inches from a wall, with your right / left side facing the wall.  Bend your right / left elbow. Gently press your right / left elbow into the wall. Increase the pressure gradually, until you are pressing as hard as you can, without shrugging your shoulder or increasing any shoulder discomfort.  Hold for __________ seconds.  Release the tension slowly. Relax your shoulder muscles completely before you begin the next repetition. Repeat __________ times. Complete this exercise __________ times per day.  STRENGTH - External Rotators, Isometric  Keep your right / left elbow at your side and bend it 90 degrees.  Step into a door frame so that the outside of your right / left wrist can press against the door frame without your upper arm leaving your side.  Gently press your right / left wrist into the door frame, as if you were trying to swing the back of your hand away from your stomach. Gradually increase the tension, until you are pressing as hard as you can, without shrugging your shoulder or increasing any shoulder discomfort.  Hold for __________ seconds.  Release the tension slowly. Relax your shoulder muscles completely before you begin the next repetition. Repeat  __________ times. Complete this exercise __________ times per day.  STRENGTH - Supraspinatus   Stand or sit with good posture. Grasp a __________ weight, or an exercise band or tubing, so that your hand is "thumbs-up," like you are shaking hands.  Slowly lift your right / left arm in a "V" away from your thigh, diagonally into the space between your side and straight ahead. Lift your hand to shoulder height or as far as you can, without increasing any shoulder pain. At first, many people do not lift their hands above shoulder height.  Avoid shrugging your right / left shoulder as your arm rises, by keeping your shoulder blade tucked down and toward your mid-back spine.  Hold for __________ seconds. Control the descent of your hand, as you slowly return to your  starting position. Repeat __________ times. Complete this exercise __________ times per day.  STRENGTH - External Rotators  Secure a rubber exercise band or tubing to a fixed object (table, pole) so that it is at the same height as your right / left elbow when you are standing or sitting on a firm surface.  Stand or sit so that the secured exercise band is at your uninjured side.  Bend your right / left elbow 90 degrees. Place a folded towel or small pillow under your right / left arm, so that your elbow is a few inches away from your side.  Keeping the tension on the exercise band, pull it away from your body, as if pivoting on your elbow. Be sure to keep your body steady, so that the movement is coming only from your rotating shoulder.  Hold for __________ seconds. Release the tension in a controlled manner, as you return to the starting position. Repeat __________ times. Complete this exercise __________ times per day.  STRENGTH - Internal Rotators   Secure a rubber exercise band or tubing to a fixed object (table, pole) so that it is at the same height as your right / left elbow when you are standing or sitting on a firm  surface.  Stand or sit so that the secured exercise band is at your right / left side.  Bend your elbow 90 degrees. Place a folded towel or small pillow under your right / left arm so that your elbow is a few inches away from your side.  Keeping the tension on the exercise band, pull it across your body, toward your stomach. Be sure to keep your body steady, so that the movement is coming only from your rotating shoulder.  Hold for __________ seconds. Release the tension in a controlled manner, as you return to the starting position. Repeat __________ times. Complete this exercise __________ times per day.  STRENGTH - Scapular Protractors, Standing   Stand arms length away from a wall. Place your hands on the wall, keeping your elbows straight.  Begin by dropping your shoulder blades down and toward your mid-back spine.  To strengthen your protractors, keep your shoulder blades down, but slide them forward on your rib cage. It will feel as if you are lifting the back of your rib cage away from the wall. This is a subtle motion and can be challenging to complete. Ask your caregiver for further instruction, if you are not sure you are doing the exercise correctly.  Hold for __________ seconds. Slowly return to the starting position, resting the muscles completely before starting the next repetition. Repeat __________ times. Complete this exercise __________ times per day. STRENGTH - Scapular Protractors, Supine  Lie on your back on a firm surface. Extend your right / left arm straight into the air while holding a __________ weight in your hand.  Keeping your head and back in place, lift your shoulder off the floor.  Hold for __________ seconds. Slowly return to the starting position, and allow your muscles to relax completely before starting the next repetition. Repeat __________ times. Complete this exercise __________ times per day. STRENGTH - Scapular Protractors, Quadruped  Get onto  your hands and knees, with your shoulders directly over your hands (or as close as you can be, comfortably).  Keeping your elbows locked, lift the back of your rib cage up into your shoulder blades, so your mid-back rounds out. Keep your neck muscles relaxed.  Hold this position for __________ seconds.  Slowly return to the starting position and allow your muscles to relax completely before starting the next repetition. Repeat __________ times. Complete this exercise __________ times per day.  STRENGTH - Scapular Retractors  Secure a rubber exercise band or tubing to a fixed object (table, pole), so that it is at the height of your shoulders when you are either standing, or sitting on a firm armless chair.  With a palm down grip, grasp an end of the band in each hand. Straighten your elbows and lift your hands straight in front of you, at shoulder height. Step back, away from the secured end of the band, until it becomes tense.  Squeezing your shoulder blades together, draw your elbows back toward your sides, as you bend them. Keep your upper arms lifted away from your body throughout the exercise.  Hold for __________ seconds. Slowly ease the tension on the band, as you reverse the directions and return to the starting position. Repeat __________ times. Complete this exercise __________ times per day. STRENGTH - Shoulder Extensors   Secure a rubber exercise band or tubing to a fixed object (table, pole) so that it is at the height of your shoulders when you are either standing, or sitting on a firm armless chair.  With a thumbs-up grip, grasp an end of the band in each hand. Straighten your elbows and lift your hands straight in front of you, at shoulder height. Step back, away from the secured end of the band, until it becomes tense.  Squeezing your shoulder blades together, pull your hands down to the sides of your thighs. Do not allow your hands to go behind you.  Hold for __________  seconds. Slowly ease the tension on the band, as you reverse the directions and return to the starting position. Repeat __________ times. Complete this exercise __________ times per day.  STRENGTH - Scapular Retractors and External Rotators   Secure a rubber exercise band or tubing to a fixed object (table, pole) so that it is at the height as your shoulders, when you are either standing, or sitting on a firm armless chair.  With a palm down grip, grasp an end of the band in each hand. Bend your elbows 90 degrees and lift your elbows to shoulder height, at your sides. Step back, away from the secured end of the band, until it becomes tense.  Squeezing your shoulder blades together, rotate your shoulders so that your upper arms and elbows remain stationary, but your fists travel upward to head height.  Hold for __________ seconds. Slowly ease the tension on the band, as you reverse the directions and return to the starting position. Repeat __________ times. Complete this exercise __________ times per day.  STRENGTH - Scapular Retractors and External Rotators, Rowing   Secure a rubber exercise band or tubing to a fixed object (table, pole) so that it is at the height of your shoulders, when you are either standing, or sitting on a firm armless chair.  With a palm down grip, grasp an end of the band in each hand. Straighten your elbows and lift your hands straight in front of you, at shoulder height. Step back, away from the secured end of the band, until it becomes tense.  Step 1: Squeeze your shoulder blades together. Bending your elbows, draw your hands to your chest, as if you are rowing a boat. At the end of this motion, your hands and elbow should be at shoulder height and your elbows should be  out to your sides.  Step 2: Rotate your shoulders, to raise your hands above your head. Your forearms should be vertical and your upper arms should be horizontal.  Hold for __________ seconds. Slowly  ease the tension on the band, as you reverse the directions and return to the starting position. Repeat __________ times. Complete this exercise __________ times per day.  STRENGTH - Scapular Depressors  Find a sturdy chair without wheels, such as a dining room chair.  Keeping your feet on the floor, and your hands on the chair arms, lift your bottom up from the seat, and lock your elbows.  Keeping your elbows straight, allow gravity to pull your body weight down. Your shoulders will rise toward your ears.  Raise your body against gravity by drawing your shoulder blades down your back, shortening the distance between your shoulders and ears. Although your feet should always maintain contact with the floor, your feet should progressively support less body weight, as you get stronger.  Hold for __________ seconds. In a controlled and slow manner, lower your body weight to begin the next repetition. Repeat __________ times. Complete this exercise __________ times per day.    This information is not intended to replace advice given to you by your health care provider. Make sure you discuss any questions you have with your health care provider.   Document Released: 09/20/2005 Document Revised: 10/11/2014 Document Reviewed: 01/02/2009 [employer] Interactive Patient Education [employer].

## 2015-12-12 NOTE — Progress Notes (Signed)
Subjective:    Patient ID: Charles Parker, male    DOB: 07/19/1946, 70 y.o.   MRN: [id number]  Chief Complaint  Patient presents with  . Shoulder Pain    Pt states there was no injury. Pain has been persistent for the last 4 days. Pain feels like a thousand needles. Pt states that the pain is mostly located around the scapula of the left shoulder.     HPI:  Charles Parker is a 70 y.o. male who  has a past medical history of Hypertension; Diabetes mellitus; Tubulovillous adenoma; and Hypercholesterolemia. and presents today for a follow up office visit.   This is a new problem. Associated symptom of pain located in his left shoulder has been going on for about 4 days. He is right sided dominant. Pain is described as stabbing that comes and goes. Aggravating factors include laying on it at night. Denies any modifying factors or attemtped treatments. No neck pain. Some radiation of pain towards his upper extremity. No trauma that he can recall or sounds/sensations that were heard or felt.  Allergies  Allergen Reactions  . Metformin And Related     diarrhea  . Codeine Rash    All over the body     Current Outpatient Prescriptions on File Prior to Visit  Medication Sig Dispense Refill  . Alcohol Swabs (B-D SINGLE USE SWABS REGULAR) PADS Use as directed Dx E11.9 180 each 3  . Blood Glucose Monitoring Suppl (TRUE METRIX METER) W/DEVICE KIT Use as directed  Dx E11.9 1 kit 0  . ezetimibe (ZETIA) 10 MG tablet Take 1 tablet (10 mg total) by mouth daily. 90 tablet 3  . glimepiride (AMARYL) 2 MG tablet Take 1 tablet (2 mg total) by mouth 2 (two) times daily with a meal. 180 tablet 1  . glucose blood (TRUE METRIX BLOOD GLUCOSE TEST) test strip 1 each by Other route 2 (two) times daily. Use to check blood sugars twice a day Dx E11.9 180 each 3  . Insulin Glargine (LANTUS SOLOSTAR) 100 UNIT/ML Solostar Pen Inject 12 Units into the skin daily at 10 pm. 5 pen 2  . Insulin Pen Needle (CAREFINE  PEN NEEDLES) 32G X 4 MM MISC Use 1x a day 100 each 1  . losartan-hydrochlorothiazide (HYZAAR) 100-12.5 MG tablet Take 1 tablet by mouth daily. 90 tablet 3  . rivaroxaban (XARELTO) 20 MG TABS tablet Take 1 tablet (20 mg total) by mouth daily with supper. 90 tablet 3  . simvastatin (ZOCOR) 40 MG tablet Take 1 tablet (40 mg total) by mouth every evening. 90 tablet 3  . TRUEPLUS LANCETS 33G MISC Use to help check blood sugars twice a day Dx E11.9 180 each 3   No current facility-administered medications on file prior to visit.     Past Surgical History  Procedure Laterality Date  . Knee surgery  2004    left  . Colonoscopy  02/2013    polyp removal(mult.)  . Proctoscopy  02/03/2012    Procedure: PROCTOSCOPY;  Surgeon: Adin Hector, MD;  Location: [facility];  Service: General;;  . Bladder neck reconstruction  02/03/2012    Procedure: BLADDER NECK REPAIR;  Surgeon: Adin Hector, MD;  Location: [facility];  Service: General;;  . Stomach surgery  02/2012      Review of Systems  Constitutional: Negative for fever and chills.  Musculoskeletal:       Positive for left shoulder pain  Neurological: Negative for weakness and numbness.  Objective:    BP 118/72 mmHg  Pulse 89  Temp(Src) 97.9 F (36.6 C) (Oral)  Wt 188 lb 4 oz (85.39 kg)  SpO2 97% Nursing note and vital signs reviewed.  Physical Exam  Constitutional: He is oriented to person, place, and time. He appears well-developed and well-nourished. No distress.  Cardiovascular: Normal rate, regular rhythm, normal heart sounds and intact distal pulses.   Pulmonary/Chest: Effort normal and breath sounds normal.  Musculoskeletal:  Left shoulder - no obvious deformity, discoloration, or edema. Tenderness elicited over subacromial space. Range of motion is within normal limits in all directions. Strength is 4-5+. Negative Michel Bickers; negative Neer's impingement; negative apprehension; negative empty can. Distal pulses and sensation  are intact and appropriate.  Neurological: He is alert and oriented to person, place, and time.  Skin: Skin is warm and dry.  Psychiatric: He has a normal mood and affect. His behavior is normal. Judgment and thought content normal.       Assessment & Plan:   Problem List Items Addressed This Visit      Musculoskeletal and Integument   Rotator cuff tendinitis - Primary    This is a new problem. Symptoms and exam consistent with rotator cuff tendinitis/bursitis. Treat conservatively with ice and home exercise therapy. Sample of Pennsaid provided. Follow-up in 3 weeks or sooner if symptoms worsen or do not improve for further imaging and possible cortisone injection.      Relevant Medications   Diclofenac Sodium (PENNSAID) 2 % SOLN

## 2015-12-18 ENCOUNTER — Telehealth: Payer: Self-pay | Admitting: Internal Medicine

## 2015-12-18 DIAGNOSIS — I824Y1 Acute embolism and thrombosis of unspecified deep veins of right proximal lower extremity: Secondary | ICD-10-CM

## 2015-12-18 DIAGNOSIS — I82401 Acute embolism and thrombosis of unspecified deep veins of right lower extremity: Secondary | ICD-10-CM

## 2015-12-18 DIAGNOSIS — M7582 Other shoulder lesions, left shoulder: Secondary | ICD-10-CM

## 2015-12-18 MED ORDER — RIVAROXABAN 20 MG PO TABS: 20.0000 mg | ORAL_TABLET | Freq: Every day | ORAL | Status: DC

## 2015-12-18 MED ORDER — DICLOFENAC SODIUM 2 % TD SOLN: 1.0000 "application " | Freq: Two times a day (BID) | TRANSDERMAL | Status: DC | PRN

## 2015-12-18 NOTE — Telephone Encounter (Signed)
Called pt no answer LMOM rx sent to [facility]../[facility] 

## 2015-12-18 NOTE — Telephone Encounter (Signed)
Patient requesting refill for rivaroxaban (XARELTO) 20 MG TABS tablet [id number]

## 2015-12-18 NOTE — Telephone Encounter (Signed)
Pt wants to know if dr Sarita Haver

## 2015-12-18 NOTE — Telephone Encounter (Signed)
Pt requesting Pennsaid. Did a prescription get set in to pharmacy or does he need to come in for more samples

## 2015-12-18 NOTE — Telephone Encounter (Signed)
rx sent in 

## 2015-12-18 NOTE — Telephone Encounter (Signed)
Notified pt rx sent to [facility].../lmb 

## 2015-12-22 ENCOUNTER — Telehealth: Payer: Self-pay

## 2015-12-22 NOTE — Telephone Encounter (Signed)
PA initiated and DENIED - covered alternative medications are Meloxicam, Ibuprofen, Naproxen, Diclofenac potassium, and Voltaren, please advise

## 2015-12-22 NOTE — Telephone Encounter (Signed)
I was not the original prescriber of this therefore will not be changing the prescription.

## 2015-12-23 NOTE — Telephone Encounter (Signed)
No new meds are needed.

## 2015-12-24 ENCOUNTER — Other Ambulatory Visit: Payer: Self-pay | Admitting: *Deleted

## 2015-12-24 ENCOUNTER — Ambulatory Visit: Payer: Self-pay | Admitting: *Deleted

## 2015-12-24 NOTE — Patient Outreach (Signed)
[facility]) Care Management  12/24/2015  Charles Parker 04-23-1946 [id number]   [name]  attempted  1st initial assessment. Patient phone disconnected.   [name], BSN, RN [facility] Care Management RN Health Coach Phone: [place] complies with applicable Federal civil rights laws and does not discriminate on the basis of race, color, national origin, age, disability, or sex. Espaol (Spanish)  [place] cumple con las leyes federales de derechos civiles aplicables y no discrimina por motivos de raza, color, nacionalidad, edad, discapacidad o sexo.    [place] ([place])  Coco tun th? lu?t dn quy?n hi?n hnh c?a Lin bang v khng phn bi?t ?i x? d?a trn ch?ng t?c, mu da, ngu?n g?c qu?c gia, ? tu?i, khuy?t t?t, ho?c gi?i tnh.    (Arabic)    [place]                      .

## 2015-12-24 NOTE — Telephone Encounter (Signed)
Pt phone number disconnected

## 2016-01-01 ENCOUNTER — Other Ambulatory Visit: Payer: Self-pay | Admitting: *Deleted

## 2016-01-01 NOTE — Patient Outreach (Signed)
[facility]) Care Management  01/01/2016  ROLFE BENSINGER [birth year] [id number]   RN Health Coach  Attempted 2nd  initial telephone assessment  outreach call to patient.  Patient was unavailable. No voice mail pickup.  [place] Care Management [phone]

## 2016-01-19 ENCOUNTER — Encounter: Payer: Self-pay | Admitting: *Deleted

## 2016-01-19 NOTE — Patient Outreach (Signed)
[facility]) Care Management  [facility] Care Manager  Late entry  01/01/2016  Charles Parker 05-Apr-1946 [id number]  Subjective: RN Health Coach telephone call to patient.  Hipaa compliance verified. Per patient his blood sugars run from 110-143.  When he feels his blood sugar getting low he eats a piece of candy. Patient knows a few symptoms but needs to be reminded. Patient is seeing a podiatrist. Patient states he is active on a bowling league and bowls 2-3 times a week. Patient is checking his blood sugars daily. Per patient he is doing fair with his  diet. Patient has agreed to follow up outreach.   Objective:   Encounter Medications:  Outpatient Encounter Prescriptions as of 01/01/2016  Medication Sig Note  . Alcohol Swabs (B-D SINGLE USE SWABS REGULAR) PADS Use as directed Dx E11.9   . Blood Glucose Monitoring Suppl (TRUE METRIX METER) W/DEVICE KIT Use as directed  Dx E11.9   . glimepiride (AMARYL) 2 MG tablet Take 1 tablet (2 mg total) by mouth 2 (two) times daily with a meal.   . glucose blood (TRUE METRIX BLOOD GLUCOSE TEST) test strip 1 each by Other route 2 (two) times daily. Use to check blood sugars twice a day Dx E11.9   . Insulin Glargine (LANTUS SOLOSTAR) 100 UNIT/ML Solostar Pen Inject 12 Units into the skin daily at 10 pm.   . Insulin Pen Needle (CAREFINE PEN NEEDLES) 32G X 4 MM MISC Use 1x a day   . rivaroxaban (XARELTO) 20 MG TABS tablet Take 1 tablet (20 mg total) by mouth daily with supper.   . simvastatin (ZOCOR) 40 MG tablet Take 1 tablet (40 mg total) by mouth every evening. 08/13/2015: Taking each morning before breakfast  . TRUEPLUS LANCETS 33G MISC Use to help check blood sugars twice a day Dx E11.9   . ezetimibe (ZETIA) 10 MG tablet Take 1 tablet (10 mg total) by mouth daily. (Patient not taking: Reported on 01/01/2016)   . losartan-hydrochlorothiazide (HYZAAR) 100-12.5 MG tablet Take 1 tablet by mouth daily. (Patient not taking: Reported on  01/01/2016) 01/01/2016: Per patient he hasn't received this prescription   No facility-administered encounter medications on file as of 01/01/2016.    Functional Status:  In your present state of health, do you have any difficulty performing the following activities: 01/01/2016 08/18/2015  Hearing? - N  Vision? - Y  Difficulty concentrating or making decisions? - N  Walking or climbing stairs? - Y  Dressing or bathing? - N  Doing errands, shopping? - N  Conservation officer, nature and eating ? N -  Using the Toilet? N -  In the past six months, have you accidently leaked urine? N -  Do you have problems with loss of bowel control? N -  Managing your Medications? N -  Managing your Finances? N -  Housekeeping or managing your Housekeeping? N -    Fall/Depression Screening: PHQ 2/9 Scores 01/01/2016 08/18/2015 07/17/2015 07/14/2015 11/02/2014 11/01/2014 11/01/2014  PHQ - 2 Score 0 0 0 0 0 0 0   THN CM Care Plan Problem One        Most Recent Value   Care Plan Problem One  Knowledge deficit in self manage ment of diabetes   Role Documenting the Problem One  [place] for Problem One  Active   THN Long Term Goal (31-90 days)  Patient hgb A1C will decrease by 1 point within the next 90 days   THN  Long Term Goal Start Date  01/01/16   Interventions for Problem One [place] reminded patient to keep appointments with primary care [place] reminded patient the importance of taking medications as per physician order. RN will monitor patient monthly  follow up discussion and teach back   THN CM Short Term Goal #1 (0-30 days)  Patient will be able to verbalize signs and symptoms of hypo and hyper glycemia within the next 30 days   THN CM Short Term Goal #1 Start Date  01/01/16   Interventions for Short Term Goal #1  RN will send patient EMMI information on hypo and hyperglycemia. RN will send patient picture charts on hyper and hypoglycemia. RN will follow  up with discussiona and teachback within a month.   THN CM Short Term Goal #2 (0-30 days)  Patient will be able to verbalize healthy snacks to eat within 30 days   THN CM Short Term Goal #2 Start Date  01/01/16   Interventions for Short Term Goal #2  RN sent patient a educational information on What can I have for snacks. . Low carbohydrate snack suggestions and very low carb snacks. RN will follow up with teach back and discussions.   THN CM Short Term Goal #3 (0-30 days)  Patient will be able to verbalize what the A1C means within 30 days   THN CM Short Term Goal #3 Start Date  01/01/16   Interventions for Short Tern Goal #3  RN sent educational material on How the A1C helps. Your A1C Goal. RN sent EMMI information on controlling your blood sugar and why get your A1C checked.   THN CM Short Term Goal #4 (0-30 days)  Patient will be able to verbalize an action plan for hypo and hyperglycemia with 30 days   THN CM Short Term Goal #4 Start Date  01/01/16   Interventions for Short Term Goal #4  RN sent educational material on treating high and low blood sugars. RN sent educational material on when to check your blood sugars, Why check your blood sugar     Assessment:  Patient will benefit from [place] telephonic outreach for education and support for diabetes self management.  Plan:  RN sent 2017 Calendar Book for documentation RN sent educational material on eating low carbohydrate snacks RN sent educational and EMMI information on A1C RN sent picture charts on HYPO and Hyperglycemia RN sent EMMI educational information on Hypo and hyperglycemia RN will follow up within 30 days for discussion and teach back  [place] Management [phone]

## 2016-01-22 ENCOUNTER — Ambulatory Visit: Payer: Commercial Managed Care - HMO | Admitting: Internal Medicine

## 2016-01-22 DIAGNOSIS — Z0289 Encounter for other administrative examinations: Secondary | ICD-10-CM

## 2016-01-30 ENCOUNTER — Ambulatory Visit: Payer: Self-pay | Admitting: *Deleted

## 2016-01-30 ENCOUNTER — Other Ambulatory Visit: Payer: Self-pay | Admitting: *Deleted

## 2016-01-30 NOTE — Patient Outreach (Signed)
[facility]) Care Management  01/30/2016  Charles Parker 03/19/1946 [id number]  [place] telephone call to patient.  Hipaa compliance verified.  Per patient he is doing good. Patient stated that he has not had the time to read any of the educational information.  Per patient he has received the information and the book to document everything. Patient requested that I call him back around the tenth of May  and we can go over all the information. Patient blood sugar today was 132. Patient is very good at reading the blood sugars back for several days from his documentation. Plan:  RN will call back @ Feb 11, 2016 and go over educational material.   [place] Management [phone]

## 2016-02-04 ENCOUNTER — Ambulatory Visit: Payer: Commercial Managed Care - HMO | Admitting: *Deleted

## 2016-02-04 ENCOUNTER — Telehealth: Payer: Self-pay

## 2016-02-04 DIAGNOSIS — D374 Neoplasm of uncertain behavior of colon: Secondary | ICD-10-CM

## 2016-02-04 DIAGNOSIS — D369 Benign neoplasm, unspecified site: Secondary | ICD-10-CM | POA: Insufficient documentation

## 2016-02-04 NOTE — Addendum Note (Signed)
Addended by: Janith Lima on: 02/04/2016 03:04 PM   Modules accepted: Orders

## 2016-02-04 NOTE — Telephone Encounter (Signed)
Patient called and stated that he wants a colonoscopy done asap.Marland Kitchen He stated he was going to have one years back and never did. Could you put in a referral for that or does he need to come in. Please advise.

## 2016-02-04 NOTE — Telephone Encounter (Signed)
Referral ordered

## 2016-02-09 ENCOUNTER — Telehealth: Payer: Self-pay | Admitting: Gastroenterology

## 2016-02-09 NOTE — Telephone Encounter (Signed)
Dr. Havery Moros reviewed records and has accepted patient. Ok to schedule Direct Colon for August. Left message for patient to return my call.

## 2016-02-11 ENCOUNTER — Ambulatory Visit: Payer: Self-pay | Admitting: *Deleted

## 2016-02-11 ENCOUNTER — Other Ambulatory Visit: Payer: Self-pay | Admitting: *Deleted

## 2016-02-11 NOTE — Patient Outreach (Signed)
[facility]) Care Management  02/11/2016  SOURYA ISIP 1946-02-25 [id number]   RN Health Coach  attempted  Follow up outreach call to patient. Hipaa compliance verified. Patient was on his way home and requested [place] call him back in 1-1/2 hrs. [place] Care Management [phone]

## 2016-02-11 NOTE — Patient Outreach (Signed)
[facility]) Care Management  02/11/2016  Charles Parker [birth year] [id number]  [place] received  telephone call from patient.  Health Coach answered phone patient laughed and hung up. RN called patient back and reached voicemail. RN left Hipaa compliant message with call back number.  [place] Management [phone]

## 2016-02-19 ENCOUNTER — Encounter: Payer: Self-pay | Admitting: Internal Medicine

## 2016-03-03 ENCOUNTER — Encounter: Payer: Self-pay | Admitting: Gastroenterology

## 2016-03-08 NOTE — Progress Notes (Signed)
This encounter was created in error - please disregard.

## 2016-03-11 NOTE — Progress Notes (Signed)
This encounter was created in error - please disregard.

## 2016-03-11 NOTE — Addendum Note (Signed)
Addended by: Tobi Bastos on: 03/11/2016 07:18 AM   Modules accepted: Level of Service, SmartSet

## 2016-03-18 ENCOUNTER — Encounter: Payer: Self-pay | Admitting: *Deleted

## 2016-03-22 ENCOUNTER — Telehealth: Payer: Self-pay | Admitting: *Deleted

## 2016-03-22 ENCOUNTER — Ambulatory Visit: Payer: Commercial Managed Care - HMO | Admitting: Gastroenterology

## 2016-03-22 NOTE — Telephone Encounter (Signed)
No show letter created and mailed to patient.

## 2016-04-02 ENCOUNTER — Telehealth: Payer: Self-pay | Admitting: Internal Medicine

## 2016-04-02 DIAGNOSIS — N401 Enlarged prostate with lower urinary tract symptoms: Secondary | ICD-10-CM

## 2016-04-02 DIAGNOSIS — R3916 Straining to void: Principal | ICD-10-CM

## 2016-04-02 NOTE — Telephone Encounter (Signed)
Pt made an appointment with [facility] on his own because he hadn't had his prostate checked in a long while. They are needing a dr referral. Please advise

## 2016-04-04 NOTE — Telephone Encounter (Signed)
Referral ordered

## 2016-04-05 NOTE — Telephone Encounter (Signed)
Appointments have been scheduled.

## 2016-04-07 ENCOUNTER — Ambulatory Visit: Payer: Commercial Managed Care - HMO | Admitting: Podiatry

## 2016-04-13 ENCOUNTER — Telehealth: Payer: Self-pay | Admitting: *Deleted

## 2016-04-13 NOTE — Telephone Encounter (Signed)
Pt states he needs to cancel his appt for tomorrow, due to finances.

## 2016-04-14 ENCOUNTER — Ambulatory Visit: Payer: Commercial Managed Care - HMO | Admitting: Podiatry

## 2016-04-15 ENCOUNTER — Encounter (HOSPITAL_COMMUNITY): Payer: Self-pay | Admitting: Emergency Medicine

## 2016-04-15 ENCOUNTER — Ambulatory Visit (INDEPENDENT_AMBULATORY_CARE_PROVIDER_SITE_OTHER): Payer: Commercial Managed Care - HMO

## 2016-04-15 ENCOUNTER — Ambulatory Visit (HOSPITAL_COMMUNITY)
Admission: EM | Admit: 2016-04-15 | Discharge: 2016-04-15 | Disposition: A | Discharge status: Home / Self Care | Payer: Commercial Managed Care - HMO | Attending: Family Medicine | Admitting: Family Medicine

## 2016-04-15 DIAGNOSIS — M79642 Pain in left hand: Secondary | ICD-10-CM | POA: Diagnosis not present

## 2016-04-15 DIAGNOSIS — R030 Elevated blood-pressure reading, without diagnosis of hypertension: Secondary | ICD-10-CM | POA: Diagnosis not present

## 2016-04-15 DIAGNOSIS — L03114 Cellulitis of left upper limb: Secondary | ICD-10-CM

## 2016-04-15 DIAGNOSIS — M19042 Primary osteoarthritis, left hand: Secondary | ICD-10-CM | POA: Diagnosis not present

## 2016-04-15 DIAGNOSIS — IMO0001 Reserved for inherently not codable concepts without codable children: Secondary | ICD-10-CM

## 2016-04-15 MED ORDER — ACETAMINOPHEN 325 MG PO TABS
650.0000 mg | ORAL_TABLET | Freq: Once | ORAL | Status: AC
  Administered 2016-04-15: 650 mg via ORAL

## 2016-04-15 MED ORDER — DOXYCYCLINE HYCLATE 100 MG PO CAPS: 100.0000 mg | ORAL_CAPSULE | Freq: Two times a day (BID) | ORAL | Status: DC

## 2016-04-15 MED ORDER — ACETAMINOPHEN 325 MG PO TABS
ORAL_TABLET | ORAL | Status: AC
  Filled 2016-04-15: qty 2

## 2016-04-15 NOTE — ED Notes (Signed)
The patient presented to the [facility] with a complaint of left hand pain and swelling for 3 days. The patient denied any known injury.

## 2016-04-15 NOTE — ED Provider Notes (Signed)
CSN: [id number]     Arrival date & time 04/15/16  1016 History   First MD Initiated Contact with Patient 04/15/16 1114     Chief Complaint  Patient presents with  . Hand Pain   (Consider location/radiation/quality/duration/timing/severity/associated sxs/prior Treatment) HPI Charles Parker is a 70 y.o. male presenting to UC with c/o sudden onset, gradually worsening Left hand and wrist pain, swelling, redness and warmth for 3 days. Pain is aching and sore, 10/10, limited ROM due to pain and swelling. Pt is Right hand dominant. Denies hx of gout. No known injuries. He has not taken anything for his symptoms. Denies fever, n/v/d.    Past Medical History  Diagnosis Date  . Hypertension   . Diabetes mellitus     type II  . Tubulovillous adenoma   . Hypercholesterolemia   . Hepatic cyst   . Renal cyst   . Diverticulosis    Past Surgical History  Procedure Laterality Date  . Knee surgery  2004    left  . Colonoscopy  02/2013    polyp removal(mult.)  . Proctoscopy  02/03/2012    Procedure: PROCTOSCOPY;  Surgeon: Adin Hector, MD;  Location: [facility];  Service: General;;  . Bladder neck reconstruction  02/03/2012    Procedure: BLADDER NECK REPAIR;  Surgeon: Adin Hector, MD;  Location: [facility];  Service: General;;  . Stomach surgery  02/2012   Family History  Problem Relation Age of Onset  . Hypertension Mother   . Diabetes Mother    Social History  Substance Use Topics  . Smoking status: Never Smoker   . Smokeless tobacco: Never Used  . Alcohol Use: 0.0 oz/week    0 Standard drinks or equivalent per week     Comment: once every two months    Review of Systems  Constitutional: Negative for fever and chills.  Gastrointestinal: Negative for nausea and vomiting.  Musculoskeletal: Positive for myalgias, joint swelling and arthralgias. Negative for neck pain and neck stiffness.  Skin: Positive for color change. Negative for rash and wound.  Neurological: Negative for weakness  and numbness.    Allergies  Metformin and related and Codeine  Home Medications   Prior to Admission medications   Medication Sig Start Date End Date Taking? Authorizing Provider  Alcohol Swabs (B-D SINGLE USE SWABS REGULAR) PADS Use as directed Dx E11.9 07/16/15  Yes Janith Lima, MD  Blood Glucose Monitoring Suppl (TRUE METRIX METER) W/DEVICE KIT Use as directed  Dx E11.9 07/16/15  Yes Janith Lima, MD  ezetimibe (ZETIA) 10 MG tablet Take 1 tablet (10 mg total) by mouth daily. 08/01/15  Yes Janith Lima, MD  glimepiride (AMARYL) 2 MG tablet Take 1 tablet (2 mg total) by mouth 2 (two) times daily with a meal. 12/09/15  Yes Philemon Kingdom, MD  glucose blood (TRUE METRIX BLOOD GLUCOSE TEST) test strip 1 each by Other route 2 (two) times daily. Use to check blood sugars twice a day Dx E11.9 07/16/15  Yes Janith Lima, MD  Insulin Glargine (LANTUS SOLOSTAR) 100 UNIT/ML Solostar Pen Inject 12 Units into the skin daily at 10 pm. 12/09/15  Yes Philemon Kingdom, MD  Insulin Pen Needle (CAREFINE PEN NEEDLES) 32G X 4 MM MISC Use 1x a day 12/09/15  Yes Philemon Kingdom, MD  losartan-hydrochlorothiazide (HYZAAR) 100-12.5 MG tablet Take 1 tablet by mouth daily. 08/16/15  Yes Janith Lima, MD  rivaroxaban (XARELTO) 20 MG TABS tablet Take 1 tablet (20 mg total)  by mouth daily with supper. 12/18/15  Yes Janith Lima, MD  simvastatin (ZOCOR) 40 MG tablet Take 1 tablet (40 mg total) by mouth every evening. 07/14/15  Yes Janith Lima, MD  TRUEPLUS LANCETS 33G MISC Use to help check blood sugars twice a day Dx E11.9 07/16/15  Yes Janith Lima, MD  doxycycline (VIBRAMYCIN) 100 MG capsule Take 1 capsule (100 mg total) by mouth 2 (two) times daily. One po bid x 7 days 04/15/16   Noland Fordyce, PA-C   Meds Ordered and Administered this Visit   Medications  acetaminophen (TYLENOL) tablet 650 mg (650 mg Oral Given 04/15/16 1225)    BP 159/104 mmHg  Pulse 95  Temp(Src) 98.3 F (36.8 C) (Oral)   Resp 18  SpO2 95% No data found.   Physical Exam  Constitutional: He is oriented to person, place, and time. He appears well-developed and well-nourished.  HENT:  Head: Normocephalic and atraumatic.  Eyes: EOM are normal.  Neck: Normal range of motion.  Cardiovascular: Normal rate.   Pulmonary/Chest: Effort normal.  Musculoskeletal: He exhibits edema and tenderness.  Left hand and wrist: mild to moderate edema, worse on ulnar aspect. Tender to touch. Limited flexion and extension due to pain.  Full ROM Left elbow w/o tenderness.  Neurological: He is alert and oriented to person, place, and time.  Skin: Skin is warm and dry. There is erythema.  Left hand and wrist: erythema and warmth to dorsal ulnar aspect. Tender. No induration or fluctuance. No bleeding or discharge.  Psychiatric: He has a normal mood and affect. His behavior is normal.  Nursing note and vitals reviewed.   ED Course  Procedures (including critical care time)  Labs Review Labs Reviewed - No data to display  Imaging Review Dg Hand Complete Left  04/15/2016  CLINICAL DATA:  Left hand and left wrist pain and swelling, redness, limited range of motion EXAM: LEFT HAND - COMPLETE 3+ VIEW COMPARISON:  None. FINDINGS: Three views of the left hand submitted. No acute fracture or subluxation. No periosteal reaction or bony erosion. Mild narrowing of radiocarpal joint space. Mild degenerative changes first carpometacarpal joint. IMPRESSION: No acute fracture or subluxation.  Mild degenerative changes. Electronically Signed   By: Lahoma Crocker M.D.   On: 04/15/2016 11:50      MDM   1. Cellulitis of left hand   2. Left hand pain   3. Elevated blood pressure    Exam concerning for underlying cellulitis. Pt cannot have NSAIDs as he is on Xarelto.  Rx: doxycycline, he declined stronger pain medication. Will take tylenol as needed. Ace wrap provided for comfort. F/u in 3-4 days if not improving   Noland Fordyce,  PA-C 04/15/16 1507

## 2016-04-15 NOTE — Discharge Instructions (Signed)
Cellulitis Cellulitis is an infection of the skin and the tissue under the skin. The infected area is usually red and tender. This happens most often in the arms and lower legs. HOME CARE   Take your antibiotic medicine as told. Finish the medicine even if you start to feel better.  Keep the infected arm or leg raised (elevated).  Put a warm cloth on the area up to 4 times per day.  Only take medicines as told by your doctor.  Keep all doctor visits as told. GET HELP IF:  You see red streaks on the skin coming from the infected area.  Your red area gets bigger or turns a dark color.  Your bone or joint under the infected area is painful after the skin heals.  Your infection comes back in the same area or different area.  You have a puffy (swollen) bump in the infected area.  You have new symptoms.  You have a fever. GET HELP RIGHT AWAY IF:   You feel very sleepy.  You throw up (vomit) or have watery poop (diarrhea).  You feel sick and have muscle aches and pains.   This information is not intended to replace advice given to you by your health care provider. Make sure you discuss any questions you have with your health care provider.   Document Released: 03/08/2008 Document Revised: 06/11/2015 Document Reviewed: 12/06/2011 Elsevier Interactive Patient Education 2016 [place] therapy can help ease sore, stiff, injured, and tight muscles and joints. Heat relaxes your muscles, which may help ease your pain. Heat therapy should only be used on old, pre-existing, or long-lasting (chronic) injuries. Do not use heat therapy unless told by your doctor. HOW TO USE HEAT THERAPY There are several different kinds of heat therapy, including:  Moist heat pack.  Warm water bath.  Hot water bottle.  Electric heating pad.  Heated gel pack.  Heated wrap.  Electric heating pad. GENERAL HEAT THERAPY RECOMMENDATIONS   Do not sleep while using heat  therapy. Only use heat therapy while you are awake.  Your skin may turn pink while using heat therapy. Do not use heat therapy if your skin turns red.  Do not use heat therapy if you have new pain.  High heat or long exposure to heat can cause burns. Be careful when using heat therapy to avoid burning your skin.  Do not use heat therapy on areas of your skin that are already irritated, such as with a rash or sunburn. GET HELP IF:   You have blisters, redness, swelling (puffiness), or numbness.  You have new pain.  Your pain is worse. MAKE SURE YOU:  Understand these instructions.  Will watch your condition.  Will get help right away if you are not doing well or get worse.   This information is not intended to replace advice given to you by your health care provider. Make sure you discuss any questions you have with your health care provider.   Document Released: 12/13/2011 Document Revised: 10/11/2014 Document Reviewed: 11/13/2013 Elsevier Interactive Patient Education [employer].

## 2016-04-21 ENCOUNTER — Ambulatory Visit: Payer: Commercial Managed Care - HMO | Admitting: Internal Medicine

## 2016-04-23 ENCOUNTER — Encounter: Payer: Self-pay | Admitting: *Deleted

## 2016-05-17 DIAGNOSIS — H1711 Central corneal opacity, right eye: Secondary | ICD-10-CM | POA: Diagnosis not present

## 2016-05-17 DIAGNOSIS — H2513 Age-related nuclear cataract, bilateral: Secondary | ICD-10-CM | POA: Diagnosis not present

## 2016-05-17 DIAGNOSIS — H40013 Open angle with borderline findings, low risk, bilateral: Secondary | ICD-10-CM | POA: Diagnosis not present

## 2016-06-01 ENCOUNTER — Ambulatory Visit (INDEPENDENT_AMBULATORY_CARE_PROVIDER_SITE_OTHER): Payer: Commercial Managed Care - HMO | Admitting: Gastroenterology

## 2016-06-01 ENCOUNTER — Telehealth: Payer: Self-pay | Admitting: *Deleted

## 2016-06-01 ENCOUNTER — Encounter: Payer: Self-pay | Admitting: Gastroenterology

## 2016-06-01 ENCOUNTER — Encounter (INDEPENDENT_AMBULATORY_CARE_PROVIDER_SITE_OTHER): Payer: Self-pay

## 2016-06-01 VITALS — BP 120/80 | HR 76 | Ht 72.0 in | Wt 199.0 lb

## 2016-06-01 DIAGNOSIS — D126 Benign neoplasm of colon, unspecified: Secondary | ICD-10-CM | POA: Diagnosis not present

## 2016-06-01 DIAGNOSIS — Z7901 Long term (current) use of anticoagulants: Secondary | ICD-10-CM | POA: Diagnosis not present

## 2016-06-01 MED ORDER — NA SULFATE-K SULFATE-MG SULF 17.5-3.13-1.6 GM/177ML PO SOLN: 1.0000 | Freq: Once | ORAL | 0 refills | Status: AC

## 2016-06-01 NOTE — Progress Notes (Signed)
HPI :  70 y/o male with a history of colon polyps, DM, HLD, HTN, here for a new patient evaluation to discuss a surveillance colonoscopy. He has a reported history of serrated adenoma removed by [facility] in 2014.   He denies any blood in the stools. He is having a few bowel movements per day. He thinks his weight is stable.   He takes Xarelto, he thinks on it for a year, but he is not sure why. Records show he has had a history of DVTs. His father had colon cancer, he thinks around age 29 or so.   Last colonoscopy on 05/2013 - 5 flat or sessile polyps in the sigmoid and transverse colon, removed with forceps. Surgical anastomosis in sigmoid colon, diverticulosis. Path shows serrated adenoma of the transverse colon, with hyperplastic polyps in the left colon.   Past Medical History:  Diagnosis Date  . Diabetes mellitus    type II  . Diverticulosis   . DVT (deep venous thrombosis) ([place])   . Hepatic cyst   . Hypercholesterolemia   . Hypertension   . Renal cyst   . Tubulovillous adenoma      Past Surgical History:  Procedure Laterality Date  . BLADDER NECK RECONSTRUCTION  02/03/2012   Procedure: BLADDER NECK REPAIR;  Surgeon: Adin Hector, MD;  Location: [facility];  Service: General;;  . COLONOSCOPY  02/2013   polyp removal(mult.)  . KNEE SURGERY  2004   left  . PROCTOSCOPY  02/03/2012   Procedure: PROCTOSCOPY;  Surgeon: Adin Hector, MD;  Location: [facility];  Service: General;;  . STOMACH SURGERY  02/2012   Family History  Problem Relation Age of Onset  . Hypertension Mother   . Diabetes Mother   . Cancer Mother     ? location  . Colon cancer Father 32   Social History  Substance Use Topics  . Smoking status: Never Smoker  . Smokeless tobacco: Never Used  . Alcohol use 0.0 oz/week     Comment: once every two months   Current Outpatient Prescriptions  Medication Sig Dispense Refill  . Alcohol Swabs (B-D SINGLE USE SWABS REGULAR) PADS Use as directed Dx E11.9 180 each 3   . Blood Glucose Monitoring Suppl (TRUE METRIX METER) W/DEVICE KIT Use as directed  Dx E11.9 1 kit 0  . glimepiride (AMARYL) 2 MG tablet Take 1 tablet (2 mg total) by mouth 2 (two) times daily with a meal. 180 tablet 1  . glucose blood (TRUE METRIX BLOOD GLUCOSE TEST) test strip 1 each by Other route 2 (two) times daily. Use to check blood sugars twice a day Dx E11.9 180 each 3  . MULTIPLE VITAMIN PO Take 1 tablet by mouth daily.     . rivaroxaban (XARELTO) 20 MG TABS tablet Take 1 tablet (20 mg total) by mouth daily with supper. 90 tablet 1  . simvastatin (ZOCOR) 40 MG tablet Take 1 tablet (40 mg total) by mouth every evening. 90 tablet 3  . TRUEPLUS LANCETS 33G MISC Use to help check blood sugars twice a day Dx E11.9 180 each 3  . Na Sulfate-K Sulfate-Mg Sulf (SUPREP BOWEL PREP KIT) 17.5-3.13-1.6 GM/180ML SOLN Take 1 kit by mouth once. 1 Bottle 0   No current facility-administered medications for this visit.    Allergies  Allergen Reactions  . Metformin And Related     diarrhea  . Codeine Rash    All over the body     Review of  Systems: All systems reviewed and negative except where noted in HPI.   Lab Results  Component Value Date   WBC 6.8 11/11/2015   HGB 14.9 11/11/2015   HCT 44.7 11/11/2015   MCV 92.2 11/11/2015   PLT 208.0 11/11/2015    Lab Results  Component Value Date   CREATININE 1.28 11/11/2015   BUN 20 11/11/2015   NA 128 (L) 11/11/2015   K 3.9 11/11/2015   CL 92 (L) 11/11/2015   CO2 28 11/11/2015    Lab Results  Component Value Date   ALT 36 07/10/2014   AST 36 07/10/2014   ALKPHOS 57 07/10/2014   BILITOT 0.5 07/10/2014      Physical Exam: BP 120/80 (BP Location: Left Arm, Patient Position: Sitting, Cuff Size: Normal)   Pulse 76   Ht 6' (1.829 m)   Wt 199 lb (90.3 kg)   BMI 26.99 kg/m  Constitutional: Pleasant,well-developed, male in no acute distress. HEENT: Normocephalic and atraumatic. Conjunctivae are normal. No scleral icterus. Neck  supple.  Cardiovascular: Normal rate, regular rhythm.  Pulmonary/chest: Effort normal and breath sounds normal. No wheezing, rales or rhonchi. Abdominal: Soft, nondistended, nontender. Bowel sounds active throughout. There are no masses palpable. No hepatomegaly. Extremities: no edema Lymphadenopathy: No cervical adenopathy noted. Neurological: Alert and oriented to person place and time. Skin: Skin is warm and dry. No rashes noted. Psychiatric: Normal mood and affect. Behavior is normal.   ASSESSMENT AND PLAN: 70 y/o male on chronic anticoagulation for a reported history of DVTs, here for evaluation for a surveillance colonoscopy. He had a serrated adenoma removed 70 years ago on his last exam, so due for a repeat colonoscopy at this time. I counseled him on the risks / benefits of colonoscopy / anesthesia, and he wished to proceed. We will reach out to his prescribing provider to see if it is okay to hold the Xarelto for 2 days prior to colonoscopy (based off last GFR in the 70s). Further recommendations pending this result.   [place] Cellar, MD [facility] Pager [phone]  CC: Janith Lima, MD

## 2016-06-01 NOTE — Patient Instructions (Signed)
You have been scheduled for a colonoscopy. Please follow written instructions given to you at your visit today.  Please pick up your prep supplies at the pharmacy within the next 1-3 days. If you use inhalers (even only as needed), please bring them with you on the day of your procedure. Your physician has requested that you go to www.startemmi.com and enter the access code given to you at your visit today. This web site gives a general overview about your procedure. However, you should still follow specific instructions given to you by our office regarding your preparation for the procedure.  We will contact you about holding your Xarelto

## 2016-06-01 NOTE — Telephone Encounter (Signed)
  06/01/2016   RE: Charles Parker DOB: Feb 08, 1946 MRN: [id number]   Dear Dr Marcello Moores   We have scheduled the above patient for an endoscopic procedure. Our records show that he is on anticoagulation therapy.   Please advise as to how long the patient may come off his therapy of Xarelto prior to the procedure, which is scheduled for 07/02/2016.  Please fax back/ or route the completed form to [facility] at [phone].   Sincerely,    Genella Mech , CMA AAMA

## 2016-06-08 ENCOUNTER — Encounter: Payer: Self-pay | Admitting: Internal Medicine

## 2016-06-09 NOTE — Telephone Encounter (Signed)
Patient aware ok to hold xarelto two days before procedure

## 2016-06-18 ENCOUNTER — Encounter: Payer: Self-pay | Admitting: Gastroenterology

## 2016-07-02 ENCOUNTER — Ambulatory Visit (AMBULATORY_SURGERY_CENTER): Payer: Commercial Managed Care - HMO | Admitting: Gastroenterology

## 2016-07-02 ENCOUNTER — Encounter: Payer: Self-pay | Admitting: Gastroenterology

## 2016-07-02 VITALS — BP 119/82 | HR 70 | Temp 98.9°F | Resp 13 | Ht 72.0 in | Wt 199.0 lb

## 2016-07-02 DIAGNOSIS — D122 Benign neoplasm of ascending colon: Secondary | ICD-10-CM | POA: Diagnosis not present

## 2016-07-02 DIAGNOSIS — E119 Type 2 diabetes mellitus without complications: Secondary | ICD-10-CM | POA: Diagnosis not present

## 2016-07-02 DIAGNOSIS — Z86010 Personal history of colonic polyps: Secondary | ICD-10-CM

## 2016-07-02 DIAGNOSIS — G4733 Obstructive sleep apnea (adult) (pediatric): Secondary | ICD-10-CM | POA: Diagnosis not present

## 2016-07-02 DIAGNOSIS — I10 Essential (primary) hypertension: Secondary | ICD-10-CM | POA: Diagnosis not present

## 2016-07-02 LAB — GLUCOSE, CAPILLARY
Glucose-Capillary: 230 mg/dL — ABNORMAL HIGH (ref 65–99)
Glucose-Capillary: 176 mg/dL — ABNORMAL HIGH (ref 65–99)

## 2016-07-02 LAB — HM COLONOSCOPY

## 2016-07-02 MED ORDER — SODIUM CHLORIDE 0.9 % IV SOLN: 500.0000 mL | INTRAVENOUS | Status: DC

## 2016-07-02 NOTE — Op Note (Signed)
[facility] Patient Name: Charles Parker Procedure Date: 07/02/2016 2:49 PM MRN: [id number] Endoscopist: Remo Lipps P. Havery Moros , MD Age: 70 Referring MD:  Date of Birth: 1946-02-28 Gender: Male Account #: [id number] Procedure:                Colonoscopy Indications:              High risk colon cancer surveillance: Personal                            history of colonic polyps Medicines:                Monitored Anesthesia Care Procedure:                Pre-Anesthesia Assessment:                           - Prior to the procedure, a History and Physical                            was performed, and patient medications and                            allergies were reviewed. The patient's tolerance of                            previous anesthesia was also reviewed. The risks                            and benefits of the procedure and the sedation                            options and risks were discussed with the patient.                            All questions were answered, and informed consent                            was obtained. Prior Anticoagulants: The patient has                            taken Xarelto (rivaroxaban), last dose was 2 days                            prior to procedure. ASA Grade Assessment: III - A                            patient with severe systemic disease. After                            reviewing the risks and benefits, the patient was                            deemed in satisfactory condition to undergo the  procedure.                           After obtaining informed consent, the colonoscope                            was passed under direct vision. Throughout the                            procedure, the patient's blood pressure, pulse, and                            oxygen saturations were monitored continuously. The                            Model CF-HQ190L [id number]) scope was introduced              through the anus and advanced to the the cecum,                            identified by appendiceal orifice and ileocecal                            valve. The colonoscopy was performed without                            difficulty. The patient tolerated the procedure                            well. The quality of the bowel preparation was                            inadequate. The ileocecal valve, appendiceal                            orifice, and rectum were photographed. Scope In: 3:00:22 PM Scope Out: 3:11:58 PM Scope Withdrawal Time: 0 hours 9 minutes 23 seconds  Total Procedure Duration: 0 hours 11 minutes 36 seconds  Findings:                 The perianal and digital rectal examinations were                            normal.                           A large amount of semi-liquid stool was found in                            the entire colon, interfering with visualization.                            The bowel prep was not adequate for screening                            purposes.  A 8 mm polyp was found in the ascending colon. The                            polyp was sessile. The polyp was removed with a                            cold snare. Resection and retrieval were complete.                           A tattoo was seen in the rectum along with a                            visible staple / anastomosis from prior rectal                            surgery.                           A few medium-mouthed diverticula were found in the                            transverse colon and left colon.                           The exam was otherwise without abnormality on                            direct and retroflexion views. Complications:            No immediate complications. Estimated blood loss:                            Minimal. Estimated Blood Loss:     Estimated blood loss was minimal. Impression:               - Preparation of the  colon was inadequate.                           - Stool in the entire examined colon.                           - One 8 mm polyp in the ascending colon, removed                            with a cold snare. Resected and retrieved.                           - A tattoo / staple line was seen in the rectum.                           - Diverticulosis in the transverse colon and in the                            left colon.                           -  The examination was otherwise normal on direct                            and retroflexion views. Recommendation:           - Patient has a contact number available for                            emergencies. The signs and symptoms of potential                            delayed complications were discussed with the                            patient. Return to normal activities tomorrow.                            Written discharge instructions were provided to the                            patient.                           - Resume previous diet.                           - Continue present medications.                           - Resume Xarelto (rivaroxaban) at prior dose in 3                            days.                           - No NSAIDS for 2 weeks                           - Await pathology results.                           - Repeat colonoscopy because the bowel preparation                            was poor at your convenience in the upcoming few                            months. You will need to hold Xarelto at least 2                            days prior to the next exam. Remo Lipps P. Kalaya Infantino, MD 07/02/2016 3:18:01 PM This report has been signed electronically.

## 2016-07-02 NOTE — Progress Notes (Signed)
To recovery, report to McCoy, RN, VSS 

## 2016-07-02 NOTE — Patient Instructions (Signed)
Discharge instructions given. Handout on polyp. Resume Xarelto at prior dose in 3 days. No NSAIDS FOR 2 WEEKS. RESUME PREVIOUS MEDICATIONS. YOU HAD AN ENDOSCOPIC PROCEDURE TODAY AT [facility]:   Refer to the procedure report that was given to you for any specific questions about what was found during the examination.  If the procedure report does not answer your questions, please call your gastroenterologist to clarify.  If you requested that your care partner not be given the details of your procedure findings, then the procedure report has been included in a sealed envelope for you to review at your convenience later.  YOU SHOULD EXPECT: Some feelings of bloating in the abdomen. Passage of more gas than usual.  Walking can help get rid of the air that was put into your GI tract during the procedure and reduce the bloating. If you had a lower endoscopy (such as a colonoscopy or flexible sigmoidoscopy) you may notice spotting of blood in your stool or on the toilet paper. If you underwent a bowel prep for your procedure, you may not have a normal bowel movement for a few days.  Please Note:  You might notice some irritation and congestion in your nose or some drainage.  This is from the oxygen used during your procedure.  There is no need for concern and it should clear up in a day or so.  SYMPTOMS TO REPORT IMMEDIATELY:   Following lower endoscopy (colonoscopy or flexible sigmoidoscopy):  Excessive amounts of blood in the stool  Significant tenderness or worsening of abdominal pains  Swelling of the abdomen that is new, acute  Fever of 100F or higher  For urgent or emergent issues, a gastroenterologist can be reached at any hour by calling [phone].   DIET:  We do recommend a small meal at first, but then you may proceed to your regular diet.  Drink plenty of fluids but you should avoid alcoholic beverages for 24 hours.  ACTIVITY:  You should plan to take it easy  for the rest of today and you should NOT DRIVE or use heavy machinery until tomorrow (because of the sedation medicines used during the test).    FOLLOW UP: Our staff will call the number listed on your records the next business day following your procedure to check on you and address any questions or concerns that you may have regarding the information given to you following your procedure. If we do not reach you, we will leave a message.  However, if you are feeling well and you are not experiencing any problems, there is no need to return our call.  We will assume that you have returned to your regular daily activities without incident.  If any biopsies were taken you will be contacted by phone or by letter within the next 1-3 weeks.  Please call us at [phone] if you have not heard about the biopsies in 3 weeks.    SIGNATURES/CONFIDENTIALITY: You and/or your care partner have signed paperwork which will be entered into your electronic medical record.  These signatures attest to the fact that that the information above on your After Visit Summary has been reviewed and is understood.  Full responsibility of the confidentiality of this discharge information lies with you and/or your care-partner.

## 2016-07-02 NOTE — Progress Notes (Signed)
Called to room to assist during endoscopic procedure.  Patient ID and intended procedure confirmed with present staff. Received instructions for my participation in the procedure from the performing physician.  

## 2016-07-05 ENCOUNTER — Telehealth: Payer: Self-pay | Admitting: *Deleted

## 2016-07-05 NOTE — Telephone Encounter (Signed)
  Follow up Call-  Call back number 07/02/2016  Post procedure Call Back phone  # [phone]  Permission to leave phone message Yes  Some recent data might be hidden     Patient questions:  Do you have a fever, pain , or abdominal swelling? No. Pain Score  0 *  Have you tolerated food without any problems? Yes.    Have you been able to return to your normal activities? Yes.    Do you have any questions about your discharge instructions: Diet   No. Medications  No. Follow up visit  No.  Do you have questions or concerns about your Care? No.  Actions: * If pain score is 4 or above: No action needed, pain <4.

## 2016-07-14 LAB — HM COLONOSCOPY

## 2016-07-30 ENCOUNTER — Other Ambulatory Visit: Payer: Self-pay | Admitting: Internal Medicine

## 2016-07-30 ENCOUNTER — Ambulatory Visit (AMBULATORY_SURGERY_CENTER): Payer: Self-pay | Admitting: *Deleted

## 2016-07-30 VITALS — Ht 72.0 in | Wt 198.0 lb

## 2016-07-30 DIAGNOSIS — I824Y1 Acute embolism and thrombosis of unspecified deep veins of right proximal lower extremity: Secondary | ICD-10-CM

## 2016-07-30 DIAGNOSIS — Z86010 Personal history of colonic polyps: Secondary | ICD-10-CM

## 2016-07-30 MED ORDER — BISACODYL 5 MG PO TBEC: 5.0000 mg | DELAYED_RELEASE_TABLET | Freq: Once | ORAL | 0 refills | Status: AC

## 2016-07-30 MED ORDER — RIVAROXABAN 20 MG PO TABS: 20.0000 mg | ORAL_TABLET | Freq: Every day | ORAL | 0 refills | Status: DC

## 2016-07-30 MED ORDER — NA SULFATE-K SULFATE-MG SULF 17.5-3.13-1.6 GM/177ML PO SOLN: 1.0000 | Freq: Once | ORAL | 0 refills | Status: AC

## 2016-07-30 MED ORDER — POLYETHYLENE GLYCOL 3350 17 GM/SCOOP PO POWD: 1.0000 | Freq: Once | ORAL | 0 refills | Status: AC

## 2016-07-30 MED ORDER — RIVAROXABAN 20 MG PO TABS: 20.0000 mg | ORAL_TABLET | Freq: Every day | ORAL | 1 refills | Status: DC

## 2016-07-30 NOTE — Telephone Encounter (Signed)
Samples given to pt. Noted in EMR. erx sent to pharmacy.

## 2016-07-30 NOTE — Progress Notes (Signed)
No egg or soy allergy known to patient  No issues with past sedation with any surgeries  or procedures, no intubation problems  No diet pills per patient No home 02 use per patient  No blood thinners per patient  Pt denies issues with constipation  No A fib or A flutter   

## 2016-07-30 NOTE — Telephone Encounter (Signed)
Pt came by office and he requested a refill on his Xarelto 20 mg tabs.  [facility] is [facility] [address]

## 2016-08-03 ENCOUNTER — Encounter: Payer: Self-pay | Admitting: Gastroenterology

## 2016-08-13 ENCOUNTER — Encounter: Payer: Self-pay | Admitting: *Deleted

## 2016-08-13 ENCOUNTER — Ambulatory Visit (AMBULATORY_SURGERY_CENTER): Payer: Commercial Managed Care - HMO | Admitting: Gastroenterology

## 2016-08-13 VITALS — BP 111/86 | HR 106 | Temp 97.8°F | Ht 72.0 in | Wt 198.0 lb

## 2016-08-13 DIAGNOSIS — Z86010 Personal history of colonic polyps: Secondary | ICD-10-CM | POA: Diagnosis not present

## 2016-08-13 LAB — GLUCOSE, CAPILLARY
GLUCOSE-CAPILLARY: 427 mg/dL — AB (ref 65–99)
Glucose-Capillary: 423 mg/dL — ABNORMAL HIGH (ref 65–99)
Glucose-Capillary: 400 mg/dL — ABNORMAL HIGH (ref 65–99)

## 2016-08-13 MED ORDER — INSULIN REGULAR HUMAN 100 UNIT/ML IJ SOLN
5.0000 [IU] | Freq: Once | INTRAMUSCULAR | Status: AC
  Administered 2016-08-13: 5 [IU] via SUBCUTANEOUS

## 2016-08-13 NOTE — Progress Notes (Signed)
Pt arrived for procedure with blood sugar of 427.  Per Dr. Doyne Keel order, will give 5 units regular insulin.  Rechecked blood sugar 30 minutes later and reading was 423.  Dr Havery Moros in to speak with patient and will plan to check blood sugar again in 10 minutes.

## 2016-08-13 NOTE — Progress Notes (Signed)
Rechecked blood sugar and reading was 400 at 1450.  Spoke with Dr. Havery Moros and we will cancel pts procedure for today.  Discussed with pt the need to get his blood sugars under control and see his primary MD for help with getting strips and possibly changing his diabetic medication.  He will plan to see Dr [name] before rescheduling colonoscopy.  When he is rescheduled, he will need a previsit appointment and needs thorough instructions for a double prep due to poor prep on previous exam.  He was supposed to complete a double prep for this exam but per our interview, he ate yesterday and did not do a double prep.

## 2016-08-13 NOTE — Progress Notes (Signed)
Agree with documentation as outlined. Patient had persistent blood sugars over 400s. Over an hour after giving insulin it was not improved. On review of blood sugars patient reports he is routinely over 300 to 400s at home despite oral medications. Advised him to follow up with primary care for diabetes management, he is asymptomatic currently from this. Unfortunately not able to proceed with colonoscopy, although despite having gone through double prep for fair prep on this last exam, he ate solid food yesterday and unclear how adequate his prep would be. He will be rescheduled for colonoscopy after his diabetes is better managed, and will contact us for scheduling. He will need a double prep again.

## 2016-08-17 ENCOUNTER — Ambulatory Visit (INDEPENDENT_AMBULATORY_CARE_PROVIDER_SITE_OTHER): Payer: Commercial Managed Care - HMO | Admitting: Internal Medicine

## 2016-08-17 ENCOUNTER — Emergency Department (HOSPITAL_COMMUNITY): Payer: Commercial Managed Care - HMO

## 2016-08-17 ENCOUNTER — Inpatient Hospital Stay (HOSPITAL_COMMUNITY)
Admission: EM | Admit: 2016-08-17 | Discharge: 2016-08-20 | DRG: 871 | Disposition: A | Discharge status: Home / Self Care | Payer: Commercial Managed Care - HMO | Attending: Family Medicine | Admitting: Family Medicine

## 2016-08-17 ENCOUNTER — Encounter (HOSPITAL_COMMUNITY): Payer: Self-pay | Admitting: Emergency Medicine

## 2016-08-17 ENCOUNTER — Encounter: Payer: Self-pay | Admitting: Internal Medicine

## 2016-08-17 ENCOUNTER — Other Ambulatory Visit: Payer: Commercial Managed Care - HMO

## 2016-08-17 VITALS — BP 140/100 | Temp 99.3°F | Resp 16 | Ht 72.0 in | Wt 185.2 lb

## 2016-08-17 DIAGNOSIS — N3000 Acute cystitis without hematuria: Secondary | ICD-10-CM

## 2016-08-17 DIAGNOSIS — Z87442 Personal history of urinary calculi: Secondary | ICD-10-CM | POA: Diagnosis not present

## 2016-08-17 DIAGNOSIS — E1121 Type 2 diabetes mellitus with diabetic nephropathy: Secondary | ICD-10-CM | POA: Diagnosis present

## 2016-08-17 DIAGNOSIS — E1165 Type 2 diabetes mellitus with hyperglycemia: Secondary | ICD-10-CM | POA: Diagnosis not present

## 2016-08-17 DIAGNOSIS — Z86718 Personal history of other venous thrombosis and embolism: Secondary | ICD-10-CM

## 2016-08-17 DIAGNOSIS — N281 Cyst of kidney, acquired: Secondary | ICD-10-CM | POA: Diagnosis present

## 2016-08-17 DIAGNOSIS — D689 Coagulation defect, unspecified: Secondary | ICD-10-CM | POA: Diagnosis not present

## 2016-08-17 DIAGNOSIS — H409 Unspecified glaucoma: Secondary | ICD-10-CM | POA: Diagnosis present

## 2016-08-17 DIAGNOSIS — E785 Hyperlipidemia, unspecified: Secondary | ICD-10-CM | POA: Diagnosis not present

## 2016-08-17 DIAGNOSIS — I10 Essential (primary) hypertension: Secondary | ICD-10-CM | POA: Diagnosis not present

## 2016-08-17 DIAGNOSIS — Z888 Allergy status to other drugs, medicaments and biological substances status: Secondary | ICD-10-CM

## 2016-08-17 DIAGNOSIS — A419 Sepsis, unspecified organism: Principal | ICD-10-CM | POA: Diagnosis present

## 2016-08-17 DIAGNOSIS — Z7901 Long term (current) use of anticoagulants: Secondary | ICD-10-CM

## 2016-08-17 DIAGNOSIS — I824Y9 Acute embolism and thrombosis of unspecified deep veins of unspecified proximal lower extremity: Secondary | ICD-10-CM | POA: Diagnosis present

## 2016-08-17 DIAGNOSIS — N400 Enlarged prostate without lower urinary tract symptoms: Secondary | ICD-10-CM | POA: Diagnosis present

## 2016-08-17 DIAGNOSIS — G4733 Obstructive sleep apnea (adult) (pediatric): Secondary | ICD-10-CM | POA: Diagnosis present

## 2016-08-17 DIAGNOSIS — H3530 Unspecified macular degeneration: Secondary | ICD-10-CM | POA: Diagnosis present

## 2016-08-17 DIAGNOSIS — D127 Benign neoplasm of rectosigmoid junction: Secondary | ICD-10-CM | POA: Diagnosis present

## 2016-08-17 DIAGNOSIS — R109 Unspecified abdominal pain: Secondary | ICD-10-CM

## 2016-08-17 DIAGNOSIS — R7881 Bacteremia: Secondary | ICD-10-CM | POA: Diagnosis not present

## 2016-08-17 DIAGNOSIS — Z8249 Family history of ischemic heart disease and other diseases of the circulatory system: Secondary | ICD-10-CM | POA: Diagnosis not present

## 2016-08-17 DIAGNOSIS — Z79899 Other long term (current) drug therapy: Secondary | ICD-10-CM

## 2016-08-17 DIAGNOSIS — E1110 Type 2 diabetes mellitus with ketoacidosis without coma: Secondary | ICD-10-CM | POA: Diagnosis present

## 2016-08-17 DIAGNOSIS — R809 Proteinuria, unspecified: Secondary | ICD-10-CM | POA: Diagnosis present

## 2016-08-17 DIAGNOSIS — I252 Old myocardial infarction: Secondary | ICD-10-CM | POA: Diagnosis not present

## 2016-08-17 DIAGNOSIS — N429 Disorder of prostate, unspecified: Secondary | ICD-10-CM

## 2016-08-17 DIAGNOSIS — N4289 Other specified disorders of prostate: Secondary | ICD-10-CM | POA: Diagnosis present

## 2016-08-17 DIAGNOSIS — N401 Enlarged prostate with lower urinary tract symptoms: Secondary | ICD-10-CM | POA: Diagnosis not present

## 2016-08-17 DIAGNOSIS — E11319 Type 2 diabetes mellitus with unspecified diabetic retinopathy without macular edema: Secondary | ICD-10-CM

## 2016-08-17 DIAGNOSIS — Z885 Allergy status to narcotic agent status: Secondary | ICD-10-CM

## 2016-08-17 DIAGNOSIS — E1111 Type 2 diabetes mellitus with ketoacidosis with coma: Secondary | ICD-10-CM | POA: Diagnosis not present

## 2016-08-17 DIAGNOSIS — Z9119 Patient's noncompliance with other medical treatment and regimen: Secondary | ICD-10-CM | POA: Diagnosis not present

## 2016-08-17 DIAGNOSIS — E7800 Pure hypercholesterolemia, unspecified: Secondary | ICD-10-CM | POA: Diagnosis present

## 2016-08-17 DIAGNOSIS — E118 Type 2 diabetes mellitus with unspecified complications: Secondary | ICD-10-CM | POA: Diagnosis not present

## 2016-08-17 DIAGNOSIS — N10 Acute tubulo-interstitial nephritis: Secondary | ICD-10-CM | POA: Diagnosis not present

## 2016-08-17 DIAGNOSIS — Z833 Family history of diabetes mellitus: Secondary | ICD-10-CM

## 2016-08-17 DIAGNOSIS — J9811 Atelectasis: Secondary | ICD-10-CM | POA: Diagnosis present

## 2016-08-17 DIAGNOSIS — B9620 Unspecified Escherichia coli [E. coli] as the cause of diseases classified elsewhere: Secondary | ICD-10-CM | POA: Diagnosis present

## 2016-08-17 DIAGNOSIS — D126 Benign neoplasm of colon, unspecified: Secondary | ICD-10-CM | POA: Diagnosis present

## 2016-08-17 DIAGNOSIS — R066 Hiccough: Secondary | ICD-10-CM | POA: Diagnosis present

## 2016-08-17 DIAGNOSIS — Z7984 Long term (current) use of oral hypoglycemic drugs: Secondary | ICD-10-CM

## 2016-08-17 LAB — POC URINALSYSI DIPSTICK (AUTOMATED)
BILIRUBIN UA: NEGATIVE
Glucose, UA: POSITIVE
Ketones, UA: POSITIVE
Leukocytes, UA: NEGATIVE
PH UA: 5.5
Protein, UA: POSITIVE
RBC UA: POSITIVE
Spec Grav, UA: 1.025
UROBILINOGEN UA: 0.2

## 2016-08-17 LAB — CBC
HEMATOCRIT: 43.1 % (ref 39.0–52.0)
Hemoglobin: 14.7 g/dL (ref 13.0–17.0)
MCH: 31.4 pg (ref 26.0–34.0)
MCHC: 34.1 g/dL (ref 30.0–36.0)
MCV: 92.1 fL (ref 78.0–100.0)
PLATELETS: 150 10*3/uL (ref 150–400)
RBC: 4.68 MIL/uL (ref 4.22–5.81)
RDW: 12.4 % (ref 11.5–15.5)
WBC: 10.4 10*3/uL (ref 4.0–10.5)

## 2016-08-17 LAB — URINE MICROSCOPIC-ADD ON

## 2016-08-17 LAB — BLOOD GAS, VENOUS
Acid-Base Excess: 0.4 mmol/L (ref 0.0–2.0)
Bicarbonate: 25.1 mmol/L (ref 20.0–28.0)
FIO2: 21
O2 SAT: 31.3 %
PATIENT TEMPERATURE: 98.6
pCO2, Ven: 42.9 mmHg — ABNORMAL LOW (ref 44.0–60.0)
pH, Ven: 7.385 (ref 7.250–7.430)

## 2016-08-17 LAB — POCT GLUCOSE (DEVICE FOR HOME USE): Glucose Fasting, POC: 389 mg/dL — AB (ref 70–99)

## 2016-08-17 LAB — BASIC METABOLIC PANEL
ANION GAP: 18 — AB (ref 5–15)
BUN: 20 mg/dL (ref 6–20)
CHLORIDE: 91 mmol/L — AB (ref 101–111)
CO2: 20 mmol/L — ABNORMAL LOW (ref 22–32)
Calcium: 9.2 mg/dL (ref 8.9–10.3)
Creatinine, Ser: 1.22 mg/dL (ref 0.61–1.24)
GFR calc non Af Amer: 58 mL/min — ABNORMAL LOW (ref >60)
Glucose, Bld: 399 mg/dL — ABNORMAL HIGH (ref 65–99)
POTASSIUM: 4.3 mmol/L (ref 3.5–5.1)
SODIUM: 129 mmol/L — AB (ref 135–145)

## 2016-08-17 LAB — URINALYSIS, ROUTINE W REFLEX MICROSCOPIC
Bilirubin Urine: NEGATIVE
Glucose, UA: >1000 mg/dL — AB
Ketones, ur: 15 mg/dL — AB
LEUKOCYTES UA: NEGATIVE
NITRITE: POSITIVE — AB
PROTEIN: 30 mg/dL — AB
SPECIFIC GRAVITY, URINE: 1.031 — AB (ref 1.005–1.030)
pH: 5.5 (ref 5.0–8.0)

## 2016-08-17 LAB — POCT GLYCOSYLATED HEMOGLOBIN (HGB A1C): Hemoglobin A1C: 12.6

## 2016-08-17 LAB — GLUCOSE, CAPILLARY: GLUCOSE-CAPILLARY: 300 mg/dL — AB (ref 65–99)

## 2016-08-17 MED ORDER — HYDROMORPHONE HCL 1 MG/ML IJ SOLN
1.0000 mg | Freq: Once | INTRAMUSCULAR | Status: AC
  Administered 2016-08-17: 1 mg via INTRAVENOUS
  Filled 2016-08-17: qty 1

## 2016-08-17 MED ORDER — RIVAROXABAN 20 MG PO TABS
20.0000 mg | ORAL_TABLET | Freq: Every day | ORAL | Status: DC
  Administered 2016-08-18 – 2016-08-20 (×3): 20 mg via ORAL
  Filled 2016-08-17 (×3): qty 1

## 2016-08-17 MED ORDER — ACETAMINOPHEN 500 MG PO TABS
1000.0000 mg | ORAL_TABLET | Freq: Once | ORAL | Status: AC
  Administered 2016-08-17: 1000 mg via ORAL
  Filled 2016-08-17: qty 2

## 2016-08-17 MED ORDER — INSULIN ASPART 100 UNIT/ML ~~LOC~~ SOLN
0.0000 [IU] | Freq: Three times a day (TID) | SUBCUTANEOUS | Status: DC
  Administered 2016-08-18: 11 [IU] via SUBCUTANEOUS
  Administered 2016-08-18: 3 [IU] via SUBCUTANEOUS
  Administered 2016-08-18: 8 [IU] via SUBCUTANEOUS
  Administered 2016-08-19 (×2): 5 [IU] via SUBCUTANEOUS
  Administered 2016-08-19: 8 [IU] via SUBCUTANEOUS
  Administered 2016-08-20 (×3): 3 [IU] via SUBCUTANEOUS

## 2016-08-17 MED ORDER — ENOXAPARIN SODIUM 40 MG/0.4ML ~~LOC~~ SOLN: 40.0000 mg | SUBCUTANEOUS | Status: DC

## 2016-08-17 MED ORDER — DEXTROSE 5 % IV SOLN: 1.0000 g | INTRAVENOUS | Status: DC

## 2016-08-17 MED ORDER — SIMVASTATIN 40 MG PO TABS
40.0000 mg | ORAL_TABLET | Freq: Every evening | ORAL | Status: DC
  Administered 2016-08-18 – 2016-08-20 (×3): 40 mg via ORAL
  Filled 2016-08-17 (×3): qty 1

## 2016-08-17 MED ORDER — SODIUM CHLORIDE 0.9 % IV BOLUS (SEPSIS)
1000.0000 mL | Freq: Once | INTRAVENOUS | Status: AC
  Administered 2016-08-17: 1000 mL via INTRAVENOUS

## 2016-08-17 MED ORDER — INSULIN GLARGINE 100 UNIT/ML ~~LOC~~ SOLN
10.0000 [IU] | Freq: Every day | SUBCUTANEOUS | Status: DC
  Administered 2016-08-17: 10 [IU] via SUBCUTANEOUS
  Filled 2016-08-17: qty 0.1

## 2016-08-17 MED ORDER — DEXTROSE 5 % IV SOLN
2.0000 g | Freq: Once | INTRAVENOUS | Status: AC
  Administered 2016-08-17: 2 g via INTRAVENOUS
  Filled 2016-08-17: qty 2

## 2016-08-17 MED ORDER — INSULIN ASPART 100 UNIT/ML ~~LOC~~ SOLN
0.0000 [IU] | Freq: Every day | SUBCUTANEOUS | Status: DC
  Administered 2016-08-17: 3 [IU] via SUBCUTANEOUS
  Administered 2016-08-18 – 2016-08-19 (×2): 4 [IU] via SUBCUTANEOUS

## 2016-08-17 MED ORDER — ONDANSETRON HCL 4 MG/2ML IJ SOLN
4.0000 mg | Freq: Once | INTRAMUSCULAR | Status: AC
  Administered 2016-08-17: 4 mg via INTRAVENOUS
  Filled 2016-08-17: qty 2

## 2016-08-17 MED ORDER — HYDROCODONE-ACETAMINOPHEN 5-325 MG PO TABS
1.0000 | ORAL_TABLET | Freq: Four times a day (QID) | ORAL | Status: DC | PRN
  Administered 2016-08-17 – 2016-08-18 (×2): 2 via ORAL
  Filled 2016-08-17 (×2): qty 2

## 2016-08-17 NOTE — Progress Notes (Signed)
EDCM went to speak to patient regarding medication assistance, however, nurse at bedside performing procedure.

## 2016-08-17 NOTE — ED Notes (Signed)
I-stat Lactic results not crossing over into chart.  Initial lactic 1922 - 2.97 2nd lactic 2152 - 2.17

## 2016-08-17 NOTE — H&P (Signed)
History and Physical    JOVAUGHN WOJTASZEK NUU:[id number] DOB: 1946-08-28 DOA: 08/17/2016   PCP: Scarlette Calico, MD Chief Complaint:  Chief Complaint  Patient presents with  . sent from PCP for UTI  . Flank Pain    HPI: RUAL VERMEER is a 70 y.o. male with medical history significant of DM2, HTN, clotting disorder on chronic xarelto.  Patient sent in by PCP after he presented to PCPs office with dysuria, urinary frequency, flank pain.  Found to have UTI, also found to have hyperglycemia of 399 with keytones in urine.  PCP was concerned about possible DKA.  Symptoms onset last Friday, have been persistent and constant since onset, nothing makes them better or worse.  ED Course: DKA is very mild with AG of only 18.  Given 3L NS bolus.  Given rocephin for UTI.  Due to flank pain a CT stone protocol was performed.  While this CT is negative for pyelo or stone, it is suspicious for possible prostate cancer.  Review of Systems: As per HPI otherwise 10 point review of systems negative.    Past Medical History:  Diagnosis Date  . Clotting disorder (HCC)    DVT  . Diabetes mellitus    type II  . Diverticulosis   . DVT (deep venous thrombosis) ([place])   . Glaucoma    no eye drops   . Hepatic cyst   . Hypercholesterolemia   . Hypertension   . Myocardial infarction    very mild long ago   . Renal cyst   . Sleep apnea   . Tubulovillous adenoma     Past Surgical History:  Procedure Laterality Date  . BLADDER NECK RECONSTRUCTION  02/03/2012   Procedure: BLADDER NECK REPAIR;  Surgeon: Adin Hector, MD;  Location: [facility];  Service: General;;  . COLONOSCOPY  02/2013   polyp removal(mult.)  . KNEE SURGERY  2004   left  . POLYPECTOMY    . PROCTOSCOPY  02/03/2012   Procedure: PROCTOSCOPY;  Surgeon: Adin Hector, MD;  Location: [facility];  Service: General;;  . STOMACH SURGERY  02/2012     reports that he has never smoked. He has never used smokeless tobacco. He reports that he drinks  alcohol. He reports that he does not use drugs.  Allergies  Allergen Reactions  . Metformin And Related     diarrhea  . Codeine Rash    All over the body    Family History  Problem Relation Age of Onset  . Hypertension Mother   . Diabetes Mother   . Cancer Mother     ? location  . Colon cancer Father 23  . Colon polyps Neg Hx       Prior to Admission medications   Medication Sig Start Date End Date Taking? Authorizing Provider  glimepiride (AMARYL) 2 MG tablet Take 1 tablet (2 mg total) by mouth 2 (two) times daily with a meal. 12/09/15  Yes Philemon Kingdom, MD  MULTIPLE VITAMIN PO Take 1 tablet by mouth daily.    Yes Historical Provider, MD  rivaroxaban (XARELTO) 20 MG TABS tablet Take 1 tablet (20 mg total) by mouth daily with supper. 07/30/16  Yes Janith Lima, MD  simvastatin (ZOCOR) 40 MG tablet Take 1 tablet (40 mg total) by mouth every evening. 07/14/15  Yes Janith Lima, MD  Alcohol Swabs (B-D SINGLE USE SWABS REGULAR) PADS Use as directed Dx E11.9 07/16/15   Janith Lima, MD  Blood Glucose  Monitoring Suppl (TRUE METRIX METER) W/DEVICE KIT Use as directed  Dx E11.9 07/16/15   Janith Lima, MD  glucose blood (TRUE METRIX BLOOD GLUCOSE TEST) test strip 1 each by Other route 2 (two) times daily. Use to check blood sugars twice a day Dx E11.9 07/16/15   Janith Lima, MD  TRUEPLUS LANCETS 33G MISC Use to help check blood sugars twice a day Dx E11.9 07/16/15   Janith Lima, MD    Physical Exam: Vitals:   08/17/16 1908 08/17/16 2130 08/17/16 2157 08/17/16 2200  BP:  130/91 104/69 114/77  Pulse:  109 64 103  Resp:  '23 18 19  ' Temp:      TempSrc:      SpO2:  94% 96% 91%  Weight: 83.9 kg (185 lb)     Height: 6' (1.829 m)         Constitutional: NAD, calm, comfortable Eyes: PERRL, lids and conjunctivae normal ENMT: Mucous membranes are moist. Posterior pharynx clear of any exudate or lesions.Normal dentition.  Neck: normal, supple, no masses, no  thyromegaly Respiratory: clear to auscultation bilaterally, no wheezing, no crackles. Normal respiratory effort. No accessory muscle use.  Cardiovascular: Regular rate and rhythm, no murmurs / rubs / gallops. No extremity edema. 2+ pedal pulses. No carotid bruits.  Abdomen: no tenderness, no masses palpated. No hepatosplenomegaly. Bowel sounds positive.  Musculoskeletal: no clubbing / cyanosis. No joint deformity upper and lower extremities. Good ROM, no contractures. Normal muscle tone.  Skin: no rashes, lesions, ulcers. No induration Neurologic: CN 2-12 grossly intact. Sensation intact, DTR normal. Strength 5/5 in all 4.  Psychiatric: Normal judgment and insight. Alert and oriented x 3. Normal mood.    Labs on Admission: I have personally reviewed following labs and imaging studies  CBC:  Recent Labs Lab 08/17/16 1520  WBC 10.4  HGB 14.7  HCT 43.1  MCV 92.1  PLT 472   Basic Metabolic Panel:  Recent Labs Lab 08/17/16 1520  NA 129*  K 4.3  CL 91*  CO2 20*  GLUCOSE 399*  BUN 20  CREATININE 1.22  CALCIUM 9.2   GFR: Estimated Creatinine Clearance: 61.8 mL/min (by C-G formula based on SCr of 1.22 mg/dL). Liver Function Tests: No results for input(s): AST, ALT, ALKPHOS, BILITOT, PROT, ALBUMIN in the last 168 hours. No results for input(s): LIPASE, AMYLASE in the last 168 hours. No results for input(s): AMMONIA in the last 168 hours. Coagulation Profile: No results for input(s): INR, PROTIME in the last 168 hours. Cardiac Enzymes: No results for input(s): CKTOTAL, CKMB, CKMBINDEX, TROPONINI in the last 168 hours. BNP (last 3 results) No results for input(s): PROBNP in the last 8760 hours. HbA1C:  Recent Labs  08/17/16 1401  HGBA1C 12.6   CBG:  Recent Labs Lab 08/13/16 1313 08/13/16 1407 08/13/16 1446  GLUCAP 427* 423* 400*   Lipid Profile: No results for input(s): CHOL, HDL, LDLCALC, TRIG, CHOLHDL, LDLDIRECT in the last 72 hours. Thyroid Function  Tests: No results for input(s): TSH, T4TOTAL, FREET4, T3FREE, THYROIDAB in the last 72 hours. Anemia Panel: No results for input(s): VITAMINB12, FOLATE, FERRITIN, TIBC, IRON, RETICCTPCT in the last 72 hours. Urine analysis:    Component Value Date/Time   COLORURINE YELLOW 08/17/2016 2047   APPEARANCEUR CLOUDY (A) 08/17/2016 2047   LABSPEC 1.031 (H) 08/17/2016 2047   PHURINE 5.5 08/17/2016 2047   GLUCOSEU >1000 (A) 08/17/2016 2047   GLUCOSEU >=1000 (A) 11/11/2015 1437   HGBUR MODERATE (A) 08/17/2016 2047  BILIRUBINUR NEGATIVE 08/17/2016 2047   BILIRUBINUR negative 08/17/2016 1359   KETONESUR 15 (A) 08/17/2016 2047   PROTEINUR 30 (A) 08/17/2016 2047   UROBILINOGEN 0.2 08/17/2016 1359   UROBILINOGEN 0.2 11/11/2015 1437   NITRITE POSITIVE (A) 08/17/2016 2047   LEUKOCYTESUR NEGATIVE 08/17/2016 2047   Sepsis Labs: '@LABRCNTIP' (procalcitonin:4,lacticidven:4) )No results found for this or any previous visit (from the past 240 hour(s)).   Radiological Exams on Admission: Dg Chest 2 View  Result Date: 08/17/2016 CLINICAL DATA:  Acute onset of sepsis.  Initial encounter. EXAM: CHEST  2 VIEW COMPARISON:  Chest radiograph performed 01/27/2012 FINDINGS: The lungs are well-aerated. There is mild elevation of the left hemidiaphragm, with minimal left basilar atelectasis. There is no evidence of pleural effusion or pneumothorax. The heart is normal in size; the mediastinal contour is within normal limits. No acute osseous abnormalities are seen. IMPRESSION: Mild elevation of the left hemidiaphragm, with minimal left basilar atelectasis. Lungs otherwise grossly clear. Electronically Signed   By: Garald Balding M.D.   On: 08/17/2016 19:54   Ct Renal Stone Study  Result Date: 08/17/2016 CLINICAL DATA:  Right flank and pelvic region pain EXAM: CT ABDOMEN AND PELVIS WITHOUT CONTRAST TECHNIQUE: Multidetector CT imaging of the abdomen and pelvis was performed following the standard protocol without oral  or intravenous contrast material administration. COMPARISON:  December 26, 2013 FINDINGS: Lower chest: There is mild patchy airspace consolidation in the lateral segment of the left lower lobe. There patchy areas of atelectasis in the lung bases bilaterally as well. There are foci of coronary artery calcification. Hepatobiliary: There is fatty infiltration near the fissure for the ligamentum teres. There is a 4 mm cyst in the posterior aspect of the medial segment left lobe of the liver. No other focal liver lesions are identified. There is cholelithiasis. Gallbladder wall is not thickened. There is no biliary duct dilatation. Pancreas: There is no pancreatic mass or inflammatory focus. Spleen: No splenic lesions are evident. Adrenals/Urinary Tract: Adrenals appear normal bilaterally. There is a cyst in the upper pole of the left kidney measuring 1.4 x 1.4 cm. A somewhat larger cyst in the mid left kidney measures 3.4 x 3.2 cm. There is a cyst in the posterior mid kidney measuring 1 x 1 cm. On the left, there is a cyst arising eccentrically from the upper pole region measuring 5.8 x 3.9 cm, slightly smaller compared to prior study. A cyst posteriorly in the upper to midportion region on the right measures 2.3 x 2.3 cm. There are 2 adjacent cysts in the lower pole region, each measuring slightly greater than 1 cm. There is no evident hydronephrosis on either side. No renal or ureteral calculus is evident on either side. The urinary bladder wall is mildly thickened and slightly irregular in contour. Stomach/Bowel: There is no appreciable bowel wall or mesenteric thickening. No evident bowel obstruction. No free air or portal venous air. Vascular/Lymphatic: There is atherosclerotic calcification in the aorta and common iliac arteries. Aorta is tortuous but shows no aneurysm. Major mesenteric vessels appear patent on this noncontrast enhanced study. There is no evident adenopathy in the abdomen or pelvis. Reproductive: The  prostate is diffusely prominent and cannot be separated from the inferior urinary bladder. There are multiple calculi in the lower pole portion of the prostate. Seminal vesicles appear unremarkable in size and contour. Apart from the prostate, there is no pelvic mass or pelvic fluid collection. Other: Appendix appears normal. There is no evident ascites or abscess in the abdomen  or pelvis. Musculoskeletal: There is degenerative change in the lumbar spine. There are a few small foci of sclerosis in the right iliac crest, stable from prior study and felt to represent small bone islands. No lytic or destructive bone lesions identified. No intramuscular or abdominal wall lesion evident. IMPRESSION: No renal or ureteral calculi. No hydronephrosis. Multiple renal cysts. The largest cyst on the right is smaller compared to prior study. Urinary bladder wall is mildly thickened and slightly irregular. This finding is concerning for cystitis. The prostate is enlarged with its superior aspect contiguous with the inferior urinary bladder. A well-defined fat plane between the urinary bladder and prostate not present. Question possible prostate carcinoma invading the inferior bladder. This finding may warrant direct visualization to further assess. Correlation with PSA advised as well. Calcifications noted in lower prostate consistent with prostatic calculi. No bowel obstruction.  No abscess.  Appendix appears normal. Cholelithiasis. Focus of infiltrate in the lateral segment left lower lobe, felt to be representative of pneumonia. There is aortic and iliac artery atherosclerosis. There are foci of coronary artery calcification evident. Electronically Signed   By: Lowella Grip III M.D.   On: 08/17/2016 19:51    EKG: Independently reviewed.  Assessment/Plan Principal Problem:   UTI (urinary tract infection) Active Problems:   Diabetes mellitus with ophthalmic complication (HCC)   Prostate mass    1. UTI  - 1. Rocephin 2. cx pending 2. DM2 - possibly minimal DKA with AG of 18 and 15 keytones in urine 1. Will put patient on lantus 10 QHs 2. Mod dose SSI AC/HS 3. Hold PO hypoglycemics 4. Repeat BMP in AM 5. Got 3L IVF bolus for this and UTI in ED 3. Prostate findings worrisome for possible prostate cancer - 1. PSA ordered 2. Call urology for consult in AM 4. H/o DVT with clotting disorder - continue xarelto for now   DVT prophylaxis: Xarelto Code Status: Full Family Communication: Brother at bedside Consults called: None Admission status: Admit to obs   GARDNER, [facility] Pager [phone] from 7PM-7AM  If 7AM-7PM, please contact the day physician for the patient [email or web address] Password [email or web address]  08/17/2016, 10:20 PM

## 2016-08-17 NOTE — ED Triage Notes (Signed)
Patient states that PCP told him he had bladder and kidney infection and to go to ED.  Patient denies dysuria or urinary retention.

## 2016-08-17 NOTE — Patient Instructions (Signed)
Flank Pain Flank pain refers to pain that is located on the side of the body between the upper abdomen and the back. The pain may occur over a short period of time (acute) or may be long-term or reoccurring (chronic). It may be mild or severe. Flank pain can be caused by many things. CAUSES  Some of the more common causes of flank pain include:  Muscle strains.   Muscle spasms.   A disease of your spine (vertebral disk disease).   A lung infection (pneumonia).   Fluid around your lungs (pulmonary edema).   A kidney infection.   Kidney stones.   A very painful skin rash caused by the chickenpox virus (shingles).   Gallbladder disease.  HOME CARE INSTRUCTIONS  Home care will depend on the cause of your pain. In general,  Rest as directed by your caregiver.  Drink enough fluids to keep your urine clear or pale yellow.  Only take over-the-counter or prescription medicines as directed by your caregiver. Some medicines may help relieve the pain.  Tell your caregiver about any changes in your pain.  Follow up with your caregiver as directed. SEEK IMMEDIATE MEDICAL CARE IF:   Your pain is not controlled with medicine.   You have new or worsening symptoms.  Your pain increases.   You have abdominal pain.   You have shortness of breath.   You have persistent nausea or vomiting.   You have swelling in your abdomen.   You feel faint or pass out.   You have blood in your urine.  You have a fever or persistent symptoms for more than 2-3 days.  You have a fever and your symptoms suddenly get worse. MAKE SURE YOU:   Understand these instructions.  Will watch your condition.  Will get help right away if you are not doing well or get worse. This information is not intended to replace advice given to you by your health care provider. Make sure you discuss any questions you have with your health care provider. Document Released: 11/11/2005 Document  Revised: 06/14/2012 Document Reviewed: 06/24/2015 Elsevier Interactive Patient Education  2017 [employer]  

## 2016-08-17 NOTE — Progress Notes (Signed)
Subjective:  Patient ID: Charles Parker, male    DOB: 1946/07/08  Age: 70 y.o. MRN: [id number]  CC: Flank Pain   HPI Charles Parker presents for a 3 day history of right-sided flank pain, mild hematuria, chills, nausea, vomiting, loss of appetite, and fatigue.  Outpatient Medications Prior to Visit  Medication Sig Dispense Refill  . Alcohol Swabs (B-D SINGLE USE SWABS REGULAR) PADS Use as directed Dx E11.9 180 each 3  . Blood Glucose Monitoring Suppl (TRUE METRIX METER) W/DEVICE KIT Use as directed  Dx E11.9 1 kit 0  . glimepiride (AMARYL) 2 MG tablet Take 1 tablet (2 mg total) by mouth 2 (two) times daily with a meal. 180 tablet 1  . glucose blood (TRUE METRIX BLOOD GLUCOSE TEST) test strip 1 each by Other route 2 (two) times daily. Use to check blood sugars twice a day Dx E11.9 180 each 3  . MULTIPLE VITAMIN PO Take 1 tablet by mouth daily.     . rivaroxaban (XARELTO) 20 MG TABS tablet Take 1 tablet (20 mg total) by mouth daily with supper. 90 tablet 0  . simvastatin (ZOCOR) 40 MG tablet Take 1 tablet (40 mg total) by mouth every evening. 90 tablet 3  . TRUEPLUS LANCETS 33G MISC Use to help check blood sugars twice a day Dx E11.9 180 each 3   Facility-Administered Medications Prior to Visit  Medication Dose Route Frequency Provider Last Rate Last Dose  . 0.9 %  sodium chloride infusion  500 mL Intravenous Continuous Manus Gunning, MD        ROS Review of Systems  Constitutional: Positive for appetite change, chills and fatigue. Negative for activity change, fever and unexpected weight change.  HENT: Negative.   Eyes: Negative.   Respiratory: Negative.  Negative for cough, choking, shortness of breath and stridor.   Cardiovascular: Negative for chest pain, palpitations and leg swelling.  Gastrointestinal: Positive for nausea and vomiting. Negative for abdominal pain, constipation and diarrhea.  Endocrine: Negative.   Genitourinary: Positive for flank pain and  hematuria. Negative for difficulty urinating, dysuria and urgency.  Skin: Negative.   Allergic/Immunologic: Negative.   Neurological: Negative.   Hematological: Negative.  Negative for adenopathy. Does not bruise/bleed easily.  Psychiatric/Behavioral: Negative.     Objective:  BP (!) 140/100 (BP Location: Right Arm, Patient Position: Sitting, Cuff Size: Normal)   Temp 99.3 F (37.4 C) (Oral)   Resp 16   Ht 6' (1.829 m)   Wt 185 lb 3 oz (84 kg)   BMI 25.12 kg/m   BP Readings from Last 3 Encounters:  08/17/16 (!) 140/100  08/17/16 136/84  08/13/16 111/86    Wt Readings from Last 3 Encounters:  08/17/16 185 lb 3 oz (84 kg)  08/17/16 185 lb (83.9 kg)  08/13/16 198 lb (89.8 kg)    Physical Exam  Constitutional: He is oriented to person, place, and time.  Non-toxic appearance. He does not have a sickly appearance. He does not appear ill. He appears distressed.  HENT:  Mouth/Throat: Oropharynx is clear and moist. No oropharyngeal exudate.  Eyes: Conjunctivae are normal. Right eye exhibits no discharge. Left eye exhibits no discharge. No scleral icterus.  Neck: Normal range of motion. Neck supple. No JVD present. No tracheal deviation present. No thyromegaly present.  Cardiovascular: Normal rate, regular rhythm, normal heart sounds and intact distal pulses.  Exam reveals no gallop and no friction rub.   No murmur heard. Pulmonary/Chest: Effort normal and breath sounds  normal. No stridor. No respiratory distress. He has no wheezes. He has no rales. He exhibits no tenderness.  Abdominal: Soft. Bowel sounds are normal. He exhibits no distension and no mass. There is no tenderness. There is CVA tenderness. There is no rebound and no guarding.  Musculoskeletal: Normal range of motion. He exhibits no edema, tenderness or deformity.  Lymphadenopathy:    He has no cervical adenopathy.  Neurological: He is oriented to person, place, and time.  Skin: Skin is warm and dry. No rash noted. No  erythema. No pallor.  Vitals reviewed.   Lab Results  Component Value Date   WBC 10.4 08/17/2016   HGB 14.7 08/17/2016   HCT 43.1 08/17/2016   PLT 150 08/17/2016   GLUCOSE 399 (H) 08/17/2016   CHOL 195 11/11/2015   TRIG 210.0 (H) 11/11/2015   HDL 43.30 11/11/2015   LDLDIRECT 118.0 11/11/2015   LDLCALC 151 (H) 06/18/2015   ALT 36 07/10/2014   AST 36 07/10/2014   NA 129 (L) 08/17/2016   K 4.3 08/17/2016   CL 91 (L) 08/17/2016   CREATININE 1.22 08/17/2016   BUN 20 08/17/2016   CO2 20 (L) 08/17/2016   TSH 1.83 11/01/2014   PSA 3.06 07/14/2015   INR 2.1 (H) 05/26/2009   HGBA1C 12.6 08/17/2016   MICROALBUR <0.7 11/11/2015    Dg Hand Complete Left  Result Date: 04/15/2016 CLINICAL DATA:  Left hand and left wrist pain and swelling, redness, limited range of motion EXAM: LEFT HAND - COMPLETE 3+ VIEW COMPARISON:  None. FINDINGS: Three views of the left hand submitted. No acute fracture or subluxation. No periosteal reaction or bony erosion. Mild narrowing of radiocarpal joint space. Mild degenerative changes first carpometacarpal joint. IMPRESSION: No acute fracture or subluxation.  Mild degenerative changes. Electronically Signed   By: Lahoma Crocker M.D.   On: 04/15/2016 11:50    Assessment & Plan:   Charles Parker was seen today for flank pain.  Diagnoses and all orders for this visit:  Type 2 diabetes mellitus with complication, unspecified long term insulin use status ([place])- He has severe hyperglycemia and ketones in his urine, I'm concerned that he is developing DKA and it therefore transferred him to the emergency room for urgent evaluation treatment. -     POCT Glucose (Device for Home Use) -     POCT glycosylated hemoglobin (Hb A1C)  Right flank pain- he has nitrites and blood in his urine, I am concerned he has pyelonephritis and would not keep down antibiotics so I have referred him to the emergency room to consider IV antibiotics. -     POCT Urinalysis Dipstick (Automated) -      CULTURE, URINE COMPREHENSIVE; Future   I am having [name] maintain his simvastatin, B-D SINGLE USE SWABS REGULAR, TRUE METRIX METER, glucose blood, TRUEPLUS LANCETS 33G, glimepiride, MULTIPLE VITAMIN PO, and rivaroxaban. We will continue to administer sodium chloride.  No orders of the defined types were placed in this encounter.    Follow-up: No Follow-up on file.  Scarlette Calico, MD

## 2016-08-17 NOTE — Progress Notes (Signed)
Pre visit review using our clinic review tool, if applicable. No additional management support is needed unless otherwise documented below in the visit note. 

## 2016-08-17 NOTE — ED Notes (Signed)
Pt being sent by PCP.  C/o hyperglycemia.  CBG 389.

## 2016-08-17 NOTE — Progress Notes (Signed)
PHARMACY NOTE -  CEFTRIAXONE  Pharmacy has consulted to assist with dosing of Ceftriaxone for UTI.  Ceftriaxone 2gm IV x 1 dose ordered in the ED.  Plan to continue Ceftriaxone with 1gm IV q24h and need for further dosage adjustment appears unlikely at present.    Will sign off at this time.  Please reconsult if a change in clinical status warrants re-evaluation of dosage.  Leone Haven, PharmD 08/17/16 @ 19:07

## 2016-08-17 NOTE — ED Provider Notes (Signed)
[facility] DEPT Provider Note   CSN: [id number] Arrival date & time: 08/17/16  1446     History   Chief Complaint Chief Complaint  Patient presents with  . sent from PCP for UTI  . Flank Pain    HPI Charles Parker is a 70 y.o. male.  70 year old African-American male with a past medical history significant for diabetes, hypertension, nephrolithiasis presents to the ED today by referral from PCP for right-sided flank pain, hematuria, nausea, vomiting, fevers. PCP was concerned patient had a possible kidney infection and was developing DKA. Patient was noted to have ketones, nitrites, blood in his urine in office today. His glucose is also elevated at 400. Patient states that he has complained of right flank pain for the past week that has progressively worsened. Patient states the pain radiates to his right lower abdomen. He also complains of hematuria and dysuria. Patient has had several episodes of emesis daily for the past week. Patient states he had an 2 episodes of non-bloody emesis today. Patient states that his blood glucoses have been elevated over the past few days in the 400 - 500s. PCP also had acute of the patient's urine was worried about developing DKA. Patient states he has not tried any home for the pain. Nothing makes better or worse. The patient was acute in onset and has gradually worsened. Patient denies any headache, vision changes, chest pain, shortness of breath, change in bowel habits, numbness/tingling.          Past Medical History:  Diagnosis Date  . Clotting disorder (HCC)    DVT  . Diabetes mellitus    type II  . Diverticulosis   . DVT (deep venous thrombosis) ([place])   . Glaucoma    no eye drops   . Hepatic cyst   . Hypercholesterolemia   . Hypertension   . Myocardial infarction    very mild long ago   . Renal cyst   . Sleep apnea   . Tubulovillous adenoma     Patient Active Problem List   Diagnosis Date Noted  . Tubulovillous  adenoma 02/04/2016  . Rotator cuff tendinitis 12/12/2015  . Diabetes mellitus with ophthalmic complication ([place]) 56/38/7564  . Tremor 11/11/2015  . Overgrown toenails 11/11/2015  . Primary osteoarthritis of both knees 07/14/2015  . DOE (dyspnea on exertion) 06/18/2015  . DVT, lower extremity, proximal ([place]) 07/10/2014  . Routine general medical examination at a health care facility 06/21/2013  . Hyperlipidemia with target LDL less than 70 06/20/2013  . Benign prostatic hyperplasia (BPH) with straining on urination 06/20/2013  . Brain atrophy 06/20/2013  . OSA (obstructive sleep apnea) 06/20/2013  . Depression with anxiety 06/20/2013  . 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 12/27/2011  . Hypertension     Past Surgical History:  Procedure Laterality Date  . BLADDER NECK RECONSTRUCTION  02/03/2012   Procedure: BLADDER NECK REPAIR;  Surgeon: Adin Hector, MD;  Location: [facility];  Service: General;;  . COLONOSCOPY  02/2013   polyp removal(mult.)  . KNEE SURGERY  2004   left  . POLYPECTOMY    . PROCTOSCOPY  02/03/2012   Procedure: PROCTOSCOPY;  Surgeon: Adin Hector, MD;  Location: [facility];  Service: General;;  . STOMACH SURGERY  02/2012       Home Medications    Prior to Admission medications   Medication Sig Start Date End Date Taking? Authorizing Provider  Alcohol Swabs (B-D  SINGLE USE SWABS REGULAR) PADS Use as directed Dx E11.9 07/16/15   Janith Lima, MD  Blood Glucose Monitoring Suppl (TRUE METRIX METER) W/DEVICE KIT Use as directed  Dx E11.9 07/16/15   Janith Lima, MD  glimepiride (AMARYL) 2 MG tablet Take 1 tablet (2 mg total) by mouth 2 (two) times daily with a meal. 12/09/15   Philemon Kingdom, MD  glucose blood (TRUE METRIX BLOOD GLUCOSE TEST) test strip 1 each by Other route 2 (two) times daily. Use to check blood sugars twice a day Dx E11.9 07/16/15   Janith Lima, MD  MULTIPLE VITAMIN PO  Take 1 tablet by mouth daily.     Historical Provider, MD  rivaroxaban (XARELTO) 20 MG TABS tablet Take 1 tablet (20 mg total) by mouth daily with supper. 07/30/16   Janith Lima, MD  simvastatin (ZOCOR) 40 MG tablet Take 1 tablet (40 mg total) by mouth every evening. 07/14/15   Janith Lima, MD  TRUEPLUS LANCETS 33G MISC Use to help check blood sugars twice a day Dx E11.9 07/16/15   Janith Lima, MD    Family History Family History  Problem Relation Age of Onset  . Hypertension Mother   . Diabetes Mother   . Cancer Mother     ? location  . Colon cancer Father 21  . Colon polyps Neg Hx     Social History Social History  Substance Use Topics  . Smoking status: Never Smoker  . Smokeless tobacco: Never Used  . Alcohol use 0.0 oz/week     Comment: once every two months     Allergies   Metformin and related and Codeine   Review of Systems Review of Systems  Constitutional: Positive for chills and fever.  HENT: Negative for congestion, ear pain, rhinorrhea and sore throat.   Eyes: Negative for pain and discharge.  Respiratory: Negative for cough and shortness of breath.   Cardiovascular: Negative for chest pain and palpitations.  Gastrointestinal: Positive for nausea and vomiting. Negative for abdominal pain and diarrhea.  Genitourinary: Positive for flank pain, hematuria and urgency. Negative for frequency.  Musculoskeletal: Negative for myalgias and neck pain.  Neurological: Negative for dizziness, syncope, weakness, light-headedness, numbness and headaches.  All other systems reviewed and are negative.    Physical Exam Updated Vital Signs BP 117/78 (BP Location: Left Arm)   Pulse (!) 110   Temp 99.6 F (37.6 C) (Oral)   Resp 14   Ht 6' (1.829 m)   Wt 83.9 kg   SpO2 94%   BMI 25.09 kg/m   Physical Exam  Constitutional: He appears well-developed and well-nourished. No distress.  HENT:  Head: Normocephalic and atraumatic.  Mouth/Throat: Oropharynx is  clear and moist. Mucous membranes are not dry.  Eyes: Conjunctivae are normal. Pupils are equal, round, and reactive to light. Right eye exhibits no discharge. Left eye exhibits no discharge. No scleral icterus.  Neck: Normal range of motion. Neck supple. No thyromegaly present.  Cardiovascular: Regular rhythm, normal heart sounds and intact distal pulses.  Tachycardia present.  Exam reveals no gallop and no friction rub.   No murmur heard. Pulmonary/Chest: Effort normal and breath sounds normal. No respiratory distress.  Abdominal: Soft. Bowel sounds are normal. He exhibits no distension. There is tenderness in the right lower quadrant. There is CVA tenderness. There is no rigidity, no rebound, no guarding and no tenderness at McBurney's point.  Musculoskeletal: Normal range of motion.  Lymphadenopathy:    He  has no cervical adenopathy.  Neurological: He is alert.  Skin: Skin is warm and dry. Capillary refill takes less than 2 seconds.  Nursing note and vitals reviewed.    ED Treatments / Results  Labs (all labs ordered are listed, but only abnormal results are displayed) Labs Reviewed  BASIC METABOLIC PANEL - Abnormal; Notable for the following:       Result Value   Sodium 129 (*)    Chloride 91 (*)    CO2 20 (*)    Glucose, Bld 399 (*)    GFR calc non Af Amer 58 (*)    Anion gap 18 (*)    All other components within normal limits  CULTURE, BLOOD (ROUTINE X 2)  CULTURE, BLOOD (ROUTINE X 2)  URINE CULTURE  CBC  URINALYSIS, ROUTINE W REFLEX MICROSCOPIC (NOT AT [facility])  BLOOD GAS, VENOUS  I-STAT CG4 LACTIC ACID, ED    EKG  EKG Interpretation None       Radiology Dg Chest 2 View  Result Date: 08/17/2016 CLINICAL DATA:  Acute onset of sepsis.  Initial encounter. EXAM: CHEST  2 VIEW COMPARISON:  Chest radiograph performed 01/27/2012 FINDINGS: The lungs are well-aerated. There is mild elevation of the left hemidiaphragm, with minimal left basilar atelectasis. There is no  evidence of pleural effusion or pneumothorax. The heart is normal in size; the mediastinal contour is within normal limits. No acute osseous abnormalities are seen. IMPRESSION: Mild elevation of the left hemidiaphragm, with minimal left basilar atelectasis. Lungs otherwise grossly clear. Electronically Signed   By: Garald Balding M.D.   On: 08/17/2016 19:54   Ct Renal Stone Study  Result Date: 08/17/2016 CLINICAL DATA:  Right flank and pelvic region pain EXAM: CT ABDOMEN AND PELVIS WITHOUT CONTRAST TECHNIQUE: Multidetector CT imaging of the abdomen and pelvis was performed following the standard protocol without oral or intravenous contrast material administration. COMPARISON:  December 26, 2013 FINDINGS: Lower chest: There is mild patchy airspace consolidation in the lateral segment of the left lower lobe. There patchy areas of atelectasis in the lung bases bilaterally as well. There are foci of coronary artery calcification. Hepatobiliary: There is fatty infiltration near the fissure for the ligamentum teres. There is a 4 mm cyst in the posterior aspect of the medial segment left lobe of the liver. No other focal liver lesions are identified. There is cholelithiasis. Gallbladder wall is not thickened. There is no biliary duct dilatation. Pancreas: There is no pancreatic mass or inflammatory focus. Spleen: No splenic lesions are evident. Adrenals/Urinary Tract: Adrenals appear normal bilaterally. There is a cyst in the upper pole of the left kidney measuring 1.4 x 1.4 cm. A somewhat larger cyst in the mid left kidney measures 3.4 x 3.2 cm. There is a cyst in the posterior mid kidney measuring 1 x 1 cm. On the left, there is a cyst arising eccentrically from the upper pole region measuring 5.8 x 3.9 cm, slightly smaller compared to prior study. A cyst posteriorly in the upper to midportion region on the right measures 2.3 x 2.3 cm. There are 2 adjacent cysts in the lower pole region, each measuring slightly  greater than 1 cm. There is no evident hydronephrosis on either side. No renal or ureteral calculus is evident on either side. The urinary bladder wall is mildly thickened and slightly irregular in contour. Stomach/Bowel: There is no appreciable bowel wall or mesenteric thickening. No evident bowel obstruction. No free air or portal venous air. Vascular/Lymphatic: There is atherosclerotic calcification  in the aorta and common iliac arteries. Aorta is tortuous but shows no aneurysm. Major mesenteric vessels appear patent on this noncontrast enhanced study. There is no evident adenopathy in the abdomen or pelvis. Reproductive: The prostate is diffusely prominent and cannot be separated from the inferior urinary bladder. There are multiple calculi in the lower pole portion of the prostate. Seminal vesicles appear unremarkable in size and contour. Apart from the prostate, there is no pelvic mass or pelvic fluid collection. Other: Appendix appears normal. There is no evident ascites or abscess in the abdomen or pelvis. Musculoskeletal: There is degenerative change in the lumbar spine. There are a few small foci of sclerosis in the right iliac crest, stable from prior study and felt to represent small bone islands. No lytic or destructive bone lesions identified. No intramuscular or abdominal wall lesion evident. IMPRESSION: No renal or ureteral calculi. No hydronephrosis. Multiple renal cysts. The largest cyst on the right is smaller compared to prior study. Urinary bladder wall is mildly thickened and slightly irregular. This finding is concerning for cystitis. The prostate is enlarged with its superior aspect contiguous with the inferior urinary bladder. A well-defined fat plane between the urinary bladder and prostate not present. Question possible prostate carcinoma invading the inferior bladder. This finding may warrant direct visualization to further assess. Correlation with PSA advised as well. Calcifications  noted in lower prostate consistent with prostatic calculi. No bowel obstruction.  No abscess.  Appendix appears normal. Cholelithiasis. Focus of infiltrate in the lateral segment left lower lobe, felt to be representative of pneumonia. There is aortic and iliac artery atherosclerosis. There are foci of coronary artery calcification evident. Electronically Signed   By: Lowella Grip III M.D.   On: 08/17/2016 19:51    Procedures Procedures (including critical care time)  Medications Ordered in ED Medications  sodium chloride 0.9 % bolus 1,000 mL (not administered)    And  sodium chloride 0.9 % bolus 1,000 mL (not administered)    And  sodium chloride 0.9 % bolus 1,000 mL (not administered)  cefTRIAXone (ROCEPHIN) 2 g in dextrose 5 % 50 mL IVPB (not administered)  cefTRIAXone (ROCEPHIN) 1 g in dextrose 5 % 50 mL IVPB (not administered)  acetaminophen (TYLENOL) tablet 1,000 mg (not administered)     Initial Impression / Assessment and Plan / ED Course  I have reviewed the triage vital signs and the nursing notes.  Pertinent labs & imaging results that were available during my care of the patient were reviewed by me and considered in my medical decision making (see chart for details).  Clinical Course   Patient presents to the ED after referral from PCP for concern for UTI, sepsis, and DKA. Patient was noted to the febrile and tachy on my exam. UA shows ketones, bacteria, hgb, nitrites, and leukocytes. Concern for UTI. Given flank pain was concerned for infected stone. No leukocytosis. Mild hyponatremia likely due to fluid depletion. Lactic was 2.7. Sepsis protocol was initialed. 30cc/kg fluid bolus given. Blood cultures obtained prior to abx. VBG showed normal ph with mild AG of 18. Given glucose was 399 will not start insulin drip at this time. CT showed no pyelo or stone it was suspicion for prostate cancer. Patient was informed about findings.  CXR was normal however abd ct showed  possible right lower lobe pna. Discussed with Dr. Alcario Drought hospitalist who will change abx if necessary. Fever treated with tylenol. Patient is non toxic appearing and in NAD. Patient treated with pain and  nausea medicine. Patient was seen and examined by Dr. Thomasene Lot who is agree to the above plan and admission. Patient is hemodynamically stable at this time. Spoke with Dr. Alcario Drought who agrees to admit patient. Patient is agreeable to admission.   CRITICAL CARE Performed by: Ocie Cornfield   Total critical care time: 30 minutes  Critical care time was exclusive of separately billable procedures and treating other patients.  Critical care was necessary to treat or prevent imminent or life-threatening deterioration.  Critical care was time spent personally by me on the following activities: development of treatment plan with patient and/or surrogate as well as nursing, discussions with consultants, evaluation of patient's response to treatment, examination of patient, obtaining history from patient or surrogate, ordering and performing treatments and interventions, ordering and review of laboratory studies, ordering and review of radiographic studies, pulse oximetry and re-evaluation of patient's condition.   Final Clinical Impressions(s) / ED Diagnoses   Final diagnoses:  Type 2 diabetes mellitus with retinopathy without macular edema, without long-term current use of insulin, unspecified laterality, unspecified retinopathy severity ([place])  Essential hypertension  Hyperlipidemia with target LDL less than 70    New Prescriptions New Prescriptions   No medications on file     Doristine Devoid, PA-C 08/19/16 [place], MD 08/20/16 1333

## 2016-08-18 DIAGNOSIS — D689 Coagulation defect, unspecified: Secondary | ICD-10-CM | POA: Diagnosis present

## 2016-08-18 DIAGNOSIS — E1111 Type 2 diabetes mellitus with ketoacidosis with coma: Secondary | ICD-10-CM | POA: Diagnosis not present

## 2016-08-18 DIAGNOSIS — G4733 Obstructive sleep apnea (adult) (pediatric): Secondary | ICD-10-CM | POA: Diagnosis present

## 2016-08-18 DIAGNOSIS — Z9119 Patient's noncompliance with other medical treatment and regimen: Secondary | ICD-10-CM | POA: Diagnosis not present

## 2016-08-18 DIAGNOSIS — E1165 Type 2 diabetes mellitus with hyperglycemia: Secondary | ICD-10-CM | POA: Diagnosis present

## 2016-08-18 DIAGNOSIS — N401 Enlarged prostate with lower urinary tract symptoms: Secondary | ICD-10-CM | POA: Diagnosis not present

## 2016-08-18 DIAGNOSIS — N400 Enlarged prostate without lower urinary tract symptoms: Secondary | ICD-10-CM | POA: Diagnosis present

## 2016-08-18 DIAGNOSIS — H3530 Unspecified macular degeneration: Secondary | ICD-10-CM | POA: Diagnosis present

## 2016-08-18 DIAGNOSIS — I252 Old myocardial infarction: Secondary | ICD-10-CM | POA: Diagnosis not present

## 2016-08-18 DIAGNOSIS — A419 Sepsis, unspecified organism: Secondary | ICD-10-CM | POA: Diagnosis present

## 2016-08-18 DIAGNOSIS — N281 Cyst of kidney, acquired: Secondary | ICD-10-CM | POA: Diagnosis present

## 2016-08-18 DIAGNOSIS — E7800 Pure hypercholesterolemia, unspecified: Secondary | ICD-10-CM | POA: Diagnosis present

## 2016-08-18 DIAGNOSIS — D127 Benign neoplasm of rectosigmoid junction: Secondary | ICD-10-CM | POA: Diagnosis present

## 2016-08-18 DIAGNOSIS — Z86718 Personal history of other venous thrombosis and embolism: Secondary | ICD-10-CM | POA: Diagnosis not present

## 2016-08-18 DIAGNOSIS — N10 Acute tubulo-interstitial nephritis: Secondary | ICD-10-CM | POA: Diagnosis present

## 2016-08-18 DIAGNOSIS — R109 Unspecified abdominal pain: Secondary | ICD-10-CM | POA: Diagnosis present

## 2016-08-18 DIAGNOSIS — E1110 Type 2 diabetes mellitus with ketoacidosis without coma: Secondary | ICD-10-CM | POA: Diagnosis present

## 2016-08-18 DIAGNOSIS — R7881 Bacteremia: Secondary | ICD-10-CM

## 2016-08-18 DIAGNOSIS — Z8249 Family history of ischemic heart disease and other diseases of the circulatory system: Secondary | ICD-10-CM | POA: Diagnosis not present

## 2016-08-18 DIAGNOSIS — R066 Hiccough: Secondary | ICD-10-CM | POA: Diagnosis present

## 2016-08-18 DIAGNOSIS — J9811 Atelectasis: Secondary | ICD-10-CM | POA: Diagnosis present

## 2016-08-18 DIAGNOSIS — Z87442 Personal history of urinary calculi: Secondary | ICD-10-CM | POA: Diagnosis not present

## 2016-08-18 DIAGNOSIS — B9620 Unspecified Escherichia coli [E. coli] as the cause of diseases classified elsewhere: Secondary | ICD-10-CM | POA: Diagnosis present

## 2016-08-18 DIAGNOSIS — E1121 Type 2 diabetes mellitus with diabetic nephropathy: Secondary | ICD-10-CM | POA: Diagnosis present

## 2016-08-18 DIAGNOSIS — H409 Unspecified glaucoma: Secondary | ICD-10-CM | POA: Diagnosis present

## 2016-08-18 DIAGNOSIS — I10 Essential (primary) hypertension: Secondary | ICD-10-CM | POA: Diagnosis present

## 2016-08-18 DIAGNOSIS — R809 Proteinuria, unspecified: Secondary | ICD-10-CM | POA: Diagnosis present

## 2016-08-18 DIAGNOSIS — E11319 Type 2 diabetes mellitus with unspecified diabetic retinopathy without macular edema: Secondary | ICD-10-CM | POA: Diagnosis present

## 2016-08-18 LAB — GLUCOSE, CAPILLARY
GLUCOSE-CAPILLARY: 294 mg/dL — AB (ref 65–99)
GLUCOSE-CAPILLARY: 187 mg/dL — AB (ref 65–99)
GLUCOSE-CAPILLARY: 306 mg/dL — AB (ref 65–99)
Glucose-Capillary: 349 mg/dL — ABNORMAL HIGH (ref 65–99)
Glucose-Capillary: 314 mg/dL — ABNORMAL HIGH (ref 65–99)
Glucose-Capillary: 327 mg/dL — ABNORMAL HIGH (ref 65–99)

## 2016-08-18 LAB — BLOOD CULTURE ID PANEL (REFLEXED)

## 2016-08-18 LAB — COMPREHENSIVE METABOLIC PANEL
ALK PHOS: 114 U/L (ref 38–126)
ALT: 26 U/L (ref 17–63)
AST: 30 U/L (ref 15–41)
Albumin: 3.1 g/dL — ABNORMAL LOW (ref 3.5–5.0)
Anion gap: 9 (ref 5–15)
BILIRUBIN TOTAL: 1 mg/dL (ref 0.3–1.2)
BUN: 23 mg/dL — ABNORMAL HIGH (ref 6–20)
CALCIUM: 8.3 mg/dL — AB (ref 8.9–10.3)
CO2: 22 mmol/L (ref 22–32)
CREATININE: 1.01 mg/dL (ref 0.61–1.24)
Chloride: 101 mmol/L (ref 101–111)
GFR calc non Af Amer: >60 mL/min (ref >60)
GLUCOSE: 339 mg/dL — AB (ref 65–99)
Potassium: 4 mmol/L (ref 3.5–5.1)
SODIUM: 132 mmol/L — AB (ref 135–145)
TOTAL PROTEIN: 7 g/dL (ref 6.5–8.1)

## 2016-08-18 LAB — CG4 I-STAT (LACTIC ACID)
Lactic Acid, Venous: 2.97 mmol/L (ref 0.5–1.9)
Lactic Acid, Venous: 2.17 mmol/L (ref 0.5–1.9)

## 2016-08-18 LAB — PSA: PSA: 1.49 ng/mL (ref 0.00–4.00)

## 2016-08-18 MED ORDER — INSULIN GLARGINE 100 UNIT/ML ~~LOC~~ SOLN
30.0000 [IU] | Freq: Every day | SUBCUTANEOUS | Status: DC
  Administered 2016-08-18: 30 [IU] via SUBCUTANEOUS
  Filled 2016-08-18 (×2): qty 0.3

## 2016-08-18 MED ORDER — MORPHINE SULFATE (PF) 2 MG/ML IV SOLN
2.0000 mg | INTRAVENOUS | Status: DC | PRN
  Administered 2016-08-19 (×2): 2 mg via INTRAVENOUS
  Filled 2016-08-18 (×2): qty 1

## 2016-08-18 MED ORDER — SODIUM CHLORIDE 0.9 % IV SOLN
INTRAVENOUS | Status: AC
  Administered 2016-08-18 – 2016-08-19 (×2): via INTRAVENOUS

## 2016-08-18 MED ORDER — DEXTROSE 5 % IV SOLN
2.0000 g | INTRAVENOUS | Status: DC
  Administered 2016-08-19: 2 g via INTRAVENOUS
  Filled 2016-08-18 (×2): qty 2

## 2016-08-18 MED ORDER — KETOROLAC TROMETHAMINE 30 MG/ML IJ SOLN
30.0000 mg | Freq: Four times a day (QID) | INTRAMUSCULAR | Status: DC | PRN
  Administered 2016-08-18 – 2016-08-20 (×3): 30 mg via INTRAVENOUS
  Filled 2016-08-18 (×3): qty 1

## 2016-08-18 MED ORDER — OXYCODONE-ACETAMINOPHEN 5-325 MG PO TABS: 2.0000 | ORAL_TABLET | Freq: Once | ORAL | Status: DC

## 2016-08-18 MED ORDER — MORPHINE SULFATE (PF) 4 MG/ML IV SOLN
4.0000 mg | Freq: Once | INTRAVENOUS | Status: AC
  Administered 2016-08-18: 4 mg via INTRAVENOUS
  Filled 2016-08-18: qty 1

## 2016-08-18 MED ORDER — INSULIN ASPART 100 UNIT/ML ~~LOC~~ SOLN
7.0000 [IU] | Freq: Three times a day (TID) | SUBCUTANEOUS | Status: DC
  Administered 2016-08-18 – 2016-08-20 (×6): 7 [IU] via SUBCUTANEOUS

## 2016-08-18 MED ORDER — GLUCERNA SHAKE PO LIQD
237.0000 mL | Freq: Two times a day (BID) | ORAL | Status: DC
  Administered 2016-08-19 – 2016-08-20 (×4): 237 mL via ORAL
  Filled 2016-08-18 (×5): qty 237

## 2016-08-18 MED ORDER — HYDROCODONE-ACETAMINOPHEN 5-325 MG PO TABS
2.0000 | ORAL_TABLET | ORAL | Status: DC | PRN
  Administered 2016-08-18 – 2016-08-20 (×7): 2 via ORAL
  Filled 2016-08-18 (×7): qty 2

## 2016-08-18 MED ORDER — INSULIN GLARGINE 100 UNIT/ML ~~LOC~~ SOLN
25.0000 [IU] | Freq: Every day | SUBCUTANEOUS | Status: DC
  Filled 2016-08-18: qty 0.25

## 2016-08-18 MED ORDER — INSULIN ASPART 100 UNIT/ML ~~LOC~~ SOLN
6.0000 [IU] | Freq: Three times a day (TID) | SUBCUTANEOUS | Status: DC
  Administered 2016-08-18: 6 [IU] via SUBCUTANEOUS

## 2016-08-18 MED ORDER — LISINOPRIL 5 MG PO TABS
5.0000 mg | ORAL_TABLET | Freq: Every day | ORAL | Status: DC
  Administered 2016-08-18 – 2016-08-20 (×3): 5 mg via ORAL
  Filled 2016-08-18 (×3): qty 1

## 2016-08-18 MED ORDER — TAMSULOSIN HCL 0.4 MG PO CAPS
0.4000 mg | ORAL_CAPSULE | Freq: Every day | ORAL | Status: DC
  Administered 2016-08-18 – 2016-08-20 (×3): 0.4 mg via ORAL
  Filled 2016-08-18 (×3): qty 1

## 2016-08-18 NOTE — Progress Notes (Signed)
PHARMACY - PHYSICIAN COMMUNICATION CRITICAL VALUE ALERT - BLOOD CULTURE IDENTIFICATION (BCID)  Results for orders placed or performed during the hospital encounter of 08/17/16  Blood Culture ID Panel (Reflexed) (Collected: 08/17/2016  7:14 PM)  Result Value Ref Range   Enterococcus species NOT DETECTED NOT DETECTED   Listeria monocytogenes NOT DETECTED NOT DETECTED   Staphylococcus species NOT DETECTED NOT DETECTED   Staphylococcus aureus NOT DETECTED NOT DETECTED   Streptococcus species NOT DETECTED NOT DETECTED   Streptococcus agalactiae NOT DETECTED NOT DETECTED   Streptococcus pneumoniae NOT DETECTED NOT DETECTED   Streptococcus pyogenes NOT DETECTED NOT DETECTED   Acinetobacter baumannii NOT DETECTED NOT DETECTED   Enterobacteriaceae species DETECTED (A) NOT DETECTED   Enterobacter cloacae complex NOT DETECTED NOT DETECTED   Escherichia coli DETECTED (A) NOT DETECTED   Klebsiella oxytoca NOT DETECTED NOT DETECTED   Klebsiella pneumoniae NOT DETECTED NOT DETECTED   Proteus species NOT DETECTED NOT DETECTED   Serratia marcescens NOT DETECTED NOT DETECTED   Carbapenem resistance NOT DETECTED NOT DETECTED   Haemophilus influenzae NOT DETECTED NOT DETECTED   Neisseria meningitidis NOT DETECTED NOT DETECTED   Pseudomonas aeruginosa NOT DETECTED NOT DETECTED   Candida albicans NOT DETECTED NOT DETECTED   Candida glabrata NOT DETECTED NOT DETECTED   Candida krusei NOT DETECTED NOT DETECTED   Candida parapsilosis NOT DETECTED NOT DETECTED   Candida tropicalis NOT DETECTED NOT DETECTED    Name of physician (or Provider) Contacted: Dr. Clementeen Graham  Changes to prescribed antibiotics required: Increase Rocephin to 2 gr IV q24h   Royetta Asal, PharmD, BCPS Pager [phone] 08/18/2016 10:41 AM

## 2016-08-18 NOTE — Progress Notes (Addendum)
PROGRESS NOTE                                                                                                                                                                                                             Patient Demographics:    Charles Parker, is a 70 y.o. male, DOB - 1946-07-05, UD:[id number]  Admit date - 08/17/2016   Admitting Physician Etta Quill, DO  Outpatient Primary MD for the patient is Scarlette Calico, MD  LOS - 0  Outpatient Specialists: None  Chief Complaint  Patient presents with  . sent from PCP for UTI  . Flank Pain       Brief Narrative  70 year old male with history of uncontrolled type 2 diabetes mellitus (only on oral hypoglycemics), hypertension, clotting disorder on chronic Xarelto, sleep apnea who presented to PCP office with dysuria, urinary frequency and flank pain for past 5 days. Patient was found to have UTI, CBG of 400 and ketones in urine. PCP was concerned for DKA and sent patient to the ED. In the ED he was tachycardic and hypertensive with low-grade temperature. Blood work showed WBC of 10.4, normal hemoglobin and platelets. Sodium of 129 with anion gap of 18, creatinine 1.2 and blood glucose of 399. Lactic acid was elevated to 2.97. Patient admitted for mild DKA and acute pyelonephritis.   Subjective:   Pt c/o rt flank pain not relived with po pain meds   Assessment  & Plan :    Principal Problem: SIRS secondary to Acute pyelonephritis with bacteremia Continue empiric Rocephin. Blood culture preliminary growing Ecoli. Suspect urinary source. Gentle IV hydration. Urine culture pending. Pain control with when necessary Vicodin and Toradol. And IV morphine if unimproved.   Active Problems:  Uncontrolled type II Diabetes mellitus with DKA with with macular degeneration and nephropathy ([place]) Patient is only on Amaryl at present. A1c persistently greater than 12 in the  past 1 year. He was on Invokana, which she stopped due to high price. He was started on Tuojeo by his PCP but was not taking them. Also stop Januvia due to price and was not compliant with metformin due to diarrhea. -He was seen by endocrinologist Dr. Cruzita Lederer in March this year and started on Lantus 12 units at bedtime but has not been taking them. -Patient presented with mild DKA which has  resolved. CBG still high in the range of 250-400. Placed on Lantus 30 units at bedtime and pre-meal aspart 7 units 3 times a day. Add low-dose lisinopril for proteinuria.  -Patient reports that his CBGs usually done between 200s-300s. -Instructed that he will need insulin upon discharge and he agrees with it. Needs insulin teaching prior to discharge.   ? Prostate mass As commented on abdominal CT. PSA normal. No other symptoms like significant weight loss or back pain. I discussed CT findings with urologist Dr. Alinda Money who reviewed the imaging. Suggested this is likely BPH and given his normal PSA without other constitutional findings, prostatic malignancy was less likely. Recommended outpatient follow-up with him. I will start patient on Flomax.  History of DVT with clotting disorder. Continue Xarelto.  Dyslipidemia Continue statin.   Code Status : Full code  Family Communication  :  update daughter  Disposition Plan  : Home possibly in 1-2 days once clinically improved and final culture and sensitivities obtained  Barriers For Discharge : Active symptoms, bacteremia  Consults  :  None (discussed with Dr. Alinda Money on the phone)  Procedures  : CT renal study  DVT Prophylaxis  :  Xarelto  Lab Results  Component Value Date   PLT 150 08/17/2016    Antibiotics  :    Anti-infectives    Start     Dose/Rate Route Frequency Ordered Stop   08/19/16 2000  cefTRIAXone (ROCEPHIN) 2 g in dextrose 5 % 50 mL IVPB     2 g 100 mL/hr over 30 Minutes Intravenous Every 24 hours 08/18/16 1033     08/18/16 2000   cefTRIAXone (ROCEPHIN) 1 g in dextrose 5 % 50 mL IVPB  Status:  Discontinued     1 g 100 mL/hr over 30 Minutes Intravenous Every 24 hours 08/17/16 1904 08/18/16 1033   08/17/16 1915  cefTRIAXone (ROCEPHIN) 2 g in dextrose 5 % 50 mL IVPB     2 g 100 mL/hr over 30 Minutes Intravenous  Once 08/17/16 1902 08/17/16 2115        Objective:   Vitals:   08/18/16 0605 08/18/16 0645 08/18/16 0650 08/18/16 1418  BP:   111/69 113/77  Pulse: (!) 139 (!) 114  90  Resp:    18  Temp:    98 F (36.7 C)  TempSrc:    Oral  SpO2:    90%  Weight:      Height:        Wt Readings from Last 3 Encounters:  08/17/16 83.9 kg (185 lb)  08/17/16 84 kg (185 lb 3 oz)  08/13/16 89.8 kg (198 lb)     Intake/Output Summary (Last 24 hours) at 08/18/16 1451 Last data filed at 08/18/16 1000  Gross per 24 hour  Intake              370 ml  Output                0 ml  Net              370 ml     Physical Exam  Gen: not in distress HEENT: no pallor, moist mucosa, supple neck Chest: clear b/l, no added sounds CVS: N S1&S2, no murmurs,  GI: soft, Bowel sounds present, nondistended, right mid quadrant and flank tenderness Musculoskeletal: warm, no edema     Data Review:    CBC  Recent Labs Lab 08/17/16 1520  WBC 10.4  HGB 14.7  HCT 43.1  PLT 150  MCV 92.1  MCH 31.4  MCHC 34.1  RDW 12.4    Chemistries   Recent Labs Lab 08/17/16 1520 08/18/16 0819  NA 129* 132*  K 4.3 4.0  CL 91* 101  CO2 20* 22  GLUCOSE 399* 339*  BUN 20 23*  CREATININE 1.22 1.01  CALCIUM 9.2 8.3*  AST  --  30  ALT  --  26  ALKPHOS  --  114  BILITOT  --  1.0   ------------------------------------------------------------------------------------------------------------------ No results for input(s): CHOL, HDL, LDLCALC, TRIG, CHOLHDL, LDLDIRECT in the last 72 hours.  Lab Results  Component Value Date   HGBA1C 12.6 08/17/2016    ------------------------------------------------------------------------------------------------------------------ No results for input(s): TSH, T4TOTAL, T3FREE, THYROIDAB in the last 72 hours.  Invalid input(s): FREET3 ------------------------------------------------------------------------------------------------------------------ No results for input(s): VITAMINB12, FOLATE, FERRITIN, TIBC, IRON, RETICCTPCT in the last 72 hours.  Coagulation profile No results for input(s): INR, PROTIME in the last 168 hours.  No results for input(s): DDIMER in the last 72 hours.  Cardiac Enzymes No results for input(s): CKMB, TROPONINI, MYOGLOBIN in the last 168 hours.  Invalid input(s): CK ------------------------------------------------------------------------------------------------------------------ No results found for: BNP  Inpatient Medications  Scheduled Meds: . [START ON 08/19/2016] cefTRIAXone (ROCEPHIN)  IV  2 g Intravenous Q24H  . feeding supplement (GLUCERNA SHAKE)  237 mL Oral BID BM  . insulin aspart  0-15 Units Subcutaneous TID WC  . insulin aspart  0-5 Units Subcutaneous QHS  . insulin aspart  6 Units Subcutaneous TID WC  . insulin glargine  25 Units Subcutaneous QHS  . rivaroxaban  20 mg Oral Q supper  . simvastatin  40 mg Oral QPM   Continuous Infusions: PRN Meds:.HYDROcodone-acetaminophen, ketorolac, morphine injection  Micro Results Recent Results (from the past 240 hour(s))  CULTURE, URINE COMPREHENSIVE     Status: None (Preliminary result)   Collection Time: 08/17/16  2:57 PM  Result Value Ref Range Status   Colony Count Greater than 100,000 CFU/mL  Preliminary   Preliminary Report GRAM NEGATIVE BACILLI ISOLATED  Preliminary  Blood Culture (routine x 2)     Status: None (Preliminary result)   Collection Time: 08/17/16  7:14 PM  Result Value Ref Range Status   Specimen Description BLOOD LEFT ARM  Final   Special Requests BOTTLES DRAWN AEROBIC AND ANAEROBIC 5CC   Final   Culture  Setup Time   Final    GRAM NEGATIVE RODS IN BOTH AEROBIC AND ANAEROBIC BOTTLES Organism ID to follow CRITICAL RESULT CALLED TO, READ BACK BY AND VERIFIED WITH: J. Scherrie November Pharm.D. 10:20 08/18/16  ([email or web address]) Performed at [facility]  Final   Report Status PENDING  Incomplete  Blood Culture (routine x 2)     Status: None (Preliminary result)   Collection Time: 08/17/16  7:14 PM  Result Value Ref Range Status   Specimen Description BLOOD RIGHT ARM  Final   Special Requests BOTTLES DRAWN AEROBIC AND ANAEROBIC 5CC  Final   Culture  Setup Time   Final    GRAM NEGATIVE RODS IN BOTH AEROBIC AND ANAEROBIC BOTTLES CRITICAL RESULT CALLED TO, READ BACK BY AND VERIFIED WITH: J. Scherrie November Pharm.D. 10:20 08/18/16  ([email or web address]) Performed at [facility]  Final   Report Status PENDING  Incomplete  Blood Culture ID Panel (Reflexed)     Status: Abnormal   Collection Time: 08/17/16  7:14 PM  Result Value Ref Range Status  Enterococcus species NOT DETECTED NOT DETECTED Final   Listeria monocytogenes NOT DETECTED NOT DETECTED Final   Staphylococcus species NOT DETECTED NOT DETECTED Final   Staphylococcus aureus NOT DETECTED NOT DETECTED Final   Streptococcus species NOT DETECTED NOT DETECTED Final   Streptococcus agalactiae NOT DETECTED NOT DETECTED Final   Streptococcus pneumoniae NOT DETECTED NOT DETECTED Final   Streptococcus pyogenes NOT DETECTED NOT DETECTED Final   Acinetobacter baumannii NOT DETECTED NOT DETECTED Final   Enterobacteriaceae species DETECTED (A) NOT DETECTED Final    Comment: CRITICAL RESULT CALLED TO, READ BACK BY AND VERIFIED WITH: J. Scherrie November Pharm.D. 10:20 08/18/16 ([email or web address])    Enterobacter cloacae complex NOT DETECTED NOT DETECTED Final   Escherichia coli DETECTED (A) NOT DETECTED Final    Comment: CRITICAL RESULT CALLED TO, READ BACK BY AND VERIFIED WITH: J. Scherrie November Pharm.D. 10:20  08/18/16 ([email or web address])    Klebsiella oxytoca NOT DETECTED NOT DETECTED Final   Klebsiella pneumoniae NOT DETECTED NOT DETECTED Final   Proteus species NOT DETECTED NOT DETECTED Final   Serratia marcescens NOT DETECTED NOT DETECTED Final   Carbapenem resistance NOT DETECTED NOT DETECTED Final   Haemophilus influenzae NOT DETECTED NOT DETECTED Final   Neisseria meningitidis NOT DETECTED NOT DETECTED Final   Pseudomonas aeruginosa NOT DETECTED NOT DETECTED Final   Candida albicans NOT DETECTED NOT DETECTED Final   Candida glabrata NOT DETECTED NOT DETECTED Final   Candida krusei NOT DETECTED NOT DETECTED Final   Candida parapsilosis NOT DETECTED NOT DETECTED Final   Candida tropicalis NOT DETECTED NOT DETECTED Final    Comment: Performed at [facility]    Radiology Reports Dg Chest 2 View  Result Date: 08/17/2016 CLINICAL DATA:  Acute onset of sepsis.  Initial encounter. EXAM: CHEST  2 VIEW COMPARISON:  Chest radiograph performed 01/27/2012 FINDINGS: The lungs are well-aerated. There is mild elevation of the left hemidiaphragm, with minimal left basilar atelectasis. There is no evidence of pleural effusion or pneumothorax. The heart is normal in size; the mediastinal contour is within normal limits. No acute osseous abnormalities are seen. IMPRESSION: Mild elevation of the left hemidiaphragm, with minimal left basilar atelectasis. Lungs otherwise grossly clear. Electronically Signed   By: Garald Balding M.D.   On: 08/17/2016 19:54   Ct Renal Stone Study  Result Date: 08/17/2016 CLINICAL DATA:  Right flank and pelvic region pain EXAM: CT ABDOMEN AND PELVIS WITHOUT CONTRAST TECHNIQUE: Multidetector CT imaging of the abdomen and pelvis was performed following the standard protocol without oral or intravenous contrast material administration. COMPARISON:  December 26, 2013 FINDINGS: Lower chest: There is mild patchy airspace consolidation in the lateral segment of the left lower lobe. There  patchy areas of atelectasis in the lung bases bilaterally as well. There are foci of coronary artery calcification. Hepatobiliary: There is fatty infiltration near the fissure for the ligamentum teres. There is a 4 mm cyst in the posterior aspect of the medial segment left lobe of the liver. No other focal liver lesions are identified. There is cholelithiasis. Gallbladder wall is not thickened. There is no biliary duct dilatation. Pancreas: There is no pancreatic mass or inflammatory focus. Spleen: No splenic lesions are evident. Adrenals/Urinary Tract: Adrenals appear normal bilaterally. There is a cyst in the upper pole of the left kidney measuring 1.4 x 1.4 cm. A somewhat larger cyst in the mid left kidney measures 3.4 x 3.2 cm. There is a cyst in the posterior mid kidney measuring 1 x 1 cm. On the left, there is  a cyst arising eccentrically from the upper pole region measuring 5.8 x 3.9 cm, slightly smaller compared to prior study. A cyst posteriorly in the upper to midportion region on the right measures 2.3 x 2.3 cm. There are 2 adjacent cysts in the lower pole region, each measuring slightly greater than 1 cm. There is no evident hydronephrosis on either side. No renal or ureteral calculus is evident on either side. The urinary bladder wall is mildly thickened and slightly irregular in contour. Stomach/Bowel: There is no appreciable bowel wall or mesenteric thickening. No evident bowel obstruction. No free air or portal venous air. Vascular/Lymphatic: There is atherosclerotic calcification in the aorta and common iliac arteries. Aorta is tortuous but shows no aneurysm. Major mesenteric vessels appear patent on this noncontrast enhanced study. There is no evident adenopathy in the abdomen or pelvis. Reproductive: The prostate is diffusely prominent and cannot be separated from the inferior urinary bladder. There are multiple calculi in the lower pole portion of the prostate. Seminal vesicles appear  unremarkable in size and contour. Apart from the prostate, there is no pelvic mass or pelvic fluid collection. Other: Appendix appears normal. There is no evident ascites or abscess in the abdomen or pelvis. Musculoskeletal: There is degenerative change in the lumbar spine. There are a few small foci of sclerosis in the right iliac crest, stable from prior study and felt to represent small bone islands. No lytic or destructive bone lesions identified. No intramuscular or abdominal wall lesion evident. IMPRESSION: No renal or ureteral calculi. No hydronephrosis. Multiple renal cysts. The largest cyst on the right is smaller compared to prior study. Urinary bladder wall is mildly thickened and slightly irregular. This finding is concerning for cystitis. The prostate is enlarged with its superior aspect contiguous with the inferior urinary bladder. A well-defined fat plane between the urinary bladder and prostate not present. Question possible prostate carcinoma invading the inferior bladder. This finding may warrant direct visualization to further assess. Correlation with PSA advised as well. Calcifications noted in lower prostate consistent with prostatic calculi. No bowel obstruction.  No abscess.  Appendix appears normal. Cholelithiasis. Focus of infiltrate in the lateral segment left lower lobe, felt to be representative of pneumonia. There is aortic and iliac artery atherosclerosis. There are foci of coronary artery calcification evident. Electronically Signed   By: Lowella Grip III M.D.   On: 08/17/2016 19:51    Time Spent in minutes  35   Louellen Molder M.D on 08/18/2016 at 2:51 PM  Between 7am to 7pm - Pager - [phone]  After 7pm go to [email or web address] - password [facility]  [facility] -  Office  [phone]

## 2016-08-18 NOTE — Progress Notes (Signed)
Initial Nutrition Assessment  DOCUMENTATION CODES:   Not applicable  INTERVENTION:  Provide Glucerna Shake po BID, each supplement provides 220 kcal and 10 grams of protein.  Encouraged adequate intake of protein and calories with meals to prevent loss of lean body mass.   RD can provide education on carbohydrate counting prior to d/c as needed by patient. He has already had 2 outpatient counseling sessions related to diabetes.  NUTRITION DIAGNOSIS:   Inadequate oral intake related to nausea, poor appetite as evidenced by per patient/family report.  GOAL:   Patient will meet greater than or equal to 90% of their needs  MONITOR:   PO intake, Supplement acceptance, Labs, Weight trends, I & O's  REASON FOR ASSESSMENT:   Malnutrition Screening Tool    ASSESSMENT:   70 y.o. male with medical history significant of DM2, HTN, clotting disorder on chronic xarelto.  Patient sent in by PCP after he presented to PCPs office with dysuria, urinary frequency, flank pain.  Found to have UTI, also found to have hyperglycemia of 399 with keytones in urine. Possibly minimal DKA with AG of 18.   -Due to flank pain a CT stone protocol was performed.  While this CT is negative for pyelo or stone, it is suspicious for possible prostate cancer. -Per review of home medications patient was on Glimepiride 2 mg BID with meals. Did not see any home insulin regimen, but patient could have previously been on one.  Spoke with patient at bedside. He reports good appetite and intake improving now. PTA he had poor appetite and intake with his fever. He is reporting some nausea and abdominal pain. Usually eats 2-3 meals daily.   UBW 200 lbs. Patient has lost 13 lbs (7% body weight) over approximately 1 month, which is significant for time frame. Patient reported to RD that this weight loss was on purpose but did not specify changes he had made to lose the weight. RD informed patient that losing weight too  quickly can result in loss of lean body mass. Encouraged adequate intake of calories and protein at this time to prevent further weight loss until patient is feeling better. Weight loss may actually be in setting of possible prostate cancer found on CT - would have elevated needs. Will follow.  Meal Completion: 100% per chart and patient report  Medications reviewed and include: Novolog TID with meals and daily at bedtime, Lantus 25 units daily at bedtime.  Labs reviewed: CBG 294-327 past 24 hrs, Sodium 132, BUN 23, Albumin 3.1, Lactic Acid (venous) 2.17, HgbA1c 12.6 on 11/14.  Nutrition-Focused physical exam completed. Findings are no fat depletion, no muscle depletion, and no edema.   Discussed with RN.   Diet Order:  Diet Carb Modified Fluid consistency: Thin; Room service appropriate? Yes  Skin:  Reviewed, no issues  Last BM:  08/17/2016  Height:   Ht Readings from Last 1 Encounters:  08/17/16 6' (1.829 m)    Weight:   Wt Readings from Last 1 Encounters:  08/17/16 185 lb (83.9 kg)    Ideal Body Weight:  80.91 kg  BMI:  Body mass index is 25.09 kg/m.  Estimated Nutritional Needs:   Kcal:  1975-2300 (MSJ x 1.2-1.4)  Protein:  100-120 grams (1.2-1.4 grams/kg)  Fluid:  >/= 2 L/day (25 ml/kg)  EDUCATION NEEDS:   Education needs no appropriate at this time  Willey Blade, MS, RD, LDN Pager: [phone] After Hours Pager: [phone]

## 2016-08-19 ENCOUNTER — Encounter: Payer: Self-pay | Admitting: *Deleted

## 2016-08-19 ENCOUNTER — Other Ambulatory Visit: Payer: Self-pay

## 2016-08-19 DIAGNOSIS — N10 Acute tubulo-interstitial nephritis: Secondary | ICD-10-CM

## 2016-08-19 LAB — BASIC METABOLIC PANEL
Anion gap: 8 (ref 5–15)
BUN: 25 mg/dL — AB (ref 6–20)
CALCIUM: 8.3 mg/dL — AB (ref 8.9–10.3)
CHLORIDE: 103 mmol/L (ref 101–111)
CO2: 23 mmol/L (ref 22–32)
CREATININE: 1.03 mg/dL (ref 0.61–1.24)
Glucose, Bld: 266 mg/dL — ABNORMAL HIGH (ref 65–99)
Potassium: 3.6 mmol/L (ref 3.5–5.1)
SODIUM: 134 mmol/L — AB (ref 135–145)

## 2016-08-19 LAB — URINE CULTURE

## 2016-08-19 LAB — GLUCOSE, CAPILLARY
GLUCOSE-CAPILLARY: 290 mg/dL — AB (ref 65–99)
GLUCOSE-CAPILLARY: 243 mg/dL — AB (ref 65–99)
Glucose-Capillary: 212 mg/dL — ABNORMAL HIGH (ref 65–99)
Glucose-Capillary: 305 mg/dL — ABNORMAL HIGH (ref 65–99)

## 2016-08-19 LAB — CULTURE, URINE COMPREHENSIVE

## 2016-08-19 MED ORDER — SODIUM CHLORIDE 0.9 % IV SOLN
INTRAVENOUS | Status: DC
Start: 2016-08-19 — End: 2016-08-20
  Administered 2016-08-19 – 2016-08-20 (×2): via INTRAVENOUS

## 2016-08-19 MED ORDER — CHLORPROMAZINE HCL 10 MG PO TABS
10.0000 mg | ORAL_TABLET | Freq: Three times a day (TID) | ORAL | Status: DC
  Administered 2016-08-19 – 2016-08-20 (×4): 10 mg via ORAL
  Filled 2016-08-19 (×5): qty 1

## 2016-08-19 MED ORDER — INSULIN GLARGINE 100 UNIT/ML ~~LOC~~ SOLN
40.0000 [IU] | Freq: Every day | SUBCUTANEOUS | Status: DC
  Administered 2016-08-19 – 2016-08-20 (×2): 40 [IU] via SUBCUTANEOUS
  Filled 2016-08-19 (×2): qty 0.4

## 2016-08-19 NOTE — Progress Notes (Signed)
PROGRESS NOTE                                                                                                                                                                                                       Patient Demographics:    Charles Parker, is a 70 y.o. male, DOB - [birth year], [id number]  Admit date - 08/17/2016   Admitting Physician Etta Quill, DO  Outpatient Primary MD for the patient is Scarlette Calico, MD  LOS - 1  Outpatient Specialists: None  Chief Complaint  Patient presents with  . sent from PCP for UTI  . Flank Pain      Brief Narrative  70 year old male with history of uncontrolled type 2 diabetes mellitus (only on oral hypoglycemics), hypertension, clotting disorder on chronic Xarelto, sleep apnea who presented to PCP office with dysuria, urinary frequency and flank pain for past 5 days. Patient was found to have UTI, CBG of 400 and ketones in urine. PCP was concerned for DKA and sent patient to the ED. In the ED he was tachycardic and hypertensive with low-grade temperature. Blood work showed WBC of 10.4, normal hemoglobin and platelets. Sodium of 129 with anion gap of 18, creatinine 1.2 and blood glucose of 399. Lactic acid was elevated to 2.97. Patient admitted for mild DKA and acute pyelonephritis.   Subjective:   Still with moderate, constant right abdomen/flank pain and persistent hiccups since admission. No fevers.    Assessment  & Plan :  Sepsis due to acute pyelonephritis with E. coli bacteremia - Continue empiric Rocephin - Urine and blood cultures w/E. coli, pending sensitivities - Gentle IVF - Pain control: toradol 30mg  IV q6h prn  maximum 5 days, norco prn, and morphine IV prn  DKA in setting of uncontrolled T2DM with macular degeneration and nephropathy: HbA1c persistently > 12% in past year. Stopped invokana and [place] due to price. Stopped metformin due to diarrhea. Also  admitted to not taking Toujeo. Only taking amaryl. - He was seen by endocrinologist Dr. Cruzita Lederer in March this year and started on Lantus 12 units at bedtime but has not been taking this. -Patient presented with mild DKA which has resolved. CBG still high in the range of 250-400. Increasing Lantus 10>30>40 units qHS with pre-meal aspart 7 units TID.  - Add low-dose lisinopril for proteinuria. - Diabetes coordinator consulted for diabetes education, will  need insulin on discharge - Care management consulted, will be referred to outpatient [facility] care management.   Left lower lobe infiltrate on CT: No evidence of pneumonia at this time, continue to suspect source is urinary.  - will continue to monitor.   Prostate mass: History of BPH. Abdominal CT findings discussed with Dr. Alinda Money who believes malignancy highly unlikely, more likely BPH. PSA is normal.  - Recommend outpatient follow-up with urology, Dr. Alinda Money.  - Start patient on Flomax. - I&O cath prn  Hiccups: Persistent since admission, likely from diaphragmatic irritation.  - Trial low dose chlorpromazine prn, anticipate improvement with resolution of underlying problem.  History of proximal DVT with clotting disorder. Continue Xarelto.  Dyslipidemia Continue statin.  Code Status: Full code  Family Communication:  update daughter  Disposition Plan: Home possibly in 24-48 hours once clinically improved and final culture and sensitivities obtained  Barriers For Discharge : Active symptoms, bacteremia  Consults  :  None (discussed with Dr. Alinda Money on the phone)  Procedures  : CT renal study  DVT Prophylaxis  :  Xarelto  Lab Results  Component Value Date   PLT 150 08/17/2016    Antibiotics  :    Anti-infectives    Start     Dose/Rate Route Frequency Ordered Stop   08/19/16 2000  cefTRIAXone (ROCEPHIN) 2 g in dextrose 5 % 50 mL IVPB     2 g 100 mL/hr over 30 Minutes Intravenous Every 24 hours 08/18/16 1033     08/18/16  2000  cefTRIAXone (ROCEPHIN) 1 g in dextrose 5 % 50 mL IVPB  Status:  Discontinued     1 g 100 mL/hr over 30 Minutes Intravenous Every 24 hours 08/17/16 1904 08/18/16 1033   08/17/16 1915  cefTRIAXone (ROCEPHIN) 2 g in dextrose 5 % 50 mL IVPB     2 g 100 mL/hr over 30 Minutes Intravenous  Once 08/17/16 1902 08/17/16 2115        Objective:   Vitals:   08/18/16 1418 08/18/16 2034 08/19/16 0518 08/19/16 1400  BP: 113/77 102/66 115/81 117/78  Pulse: 90 70 (!) 102 (!) 110  Resp: 18 18  14   Temp: 98 F (36.7 C) 99.5 F (37.5 C) 98.5 F (36.9 C) 99.6 F (37.6 C)  TempSrc: Oral Oral Oral Oral  SpO2: 90% 97% 91% 94%  Weight:      Height:        Wt Readings from Last 3 Encounters:  08/17/16 83.9 kg (185 lb)  08/17/16 84 kg (185 lb 3 oz)  08/13/16 89.8 kg (198 lb)     Intake/Output Summary (Last 24 hours) at 08/19/16 1520 Last data filed at 08/19/16 1100  Gross per 24 hour  Intake              870 ml  Output              625 ml  Net              245 ml   Physical Exam Gen: not in distress HEENT: no pallor, moist mucosa, supple neck Chest: clear b/l, no added sounds CVS: N S1&S2, no murmurs,  GI: soft, Bowel sounds present, nondistended, right mid quadrant and flank tenderness Musculoskeletal: warm, no edema   Data Review:    CBC  Recent Labs Lab 08/17/16 1520  WBC 10.4  HGB 14.7  HCT 43.1  PLT 150  MCV 92.1  MCH 31.4  MCHC 34.1  RDW 12.4  Chemistries   Recent Labs Lab 08/17/16 1520 08/18/16 0819 08/19/16 0430  NA 129* 132* 134*  K 4.3 4.0 3.6  CL 91* 101 103  CO2 20* 22 23  GLUCOSE 399* 339* 266*  BUN 20 23* 25*  CREATININE 1.22 1.01 1.03  CALCIUM 9.2 8.3* 8.3*  AST  --  30  --   ALT  --  26  --   ALKPHOS  --  114  --   BILITOT  --  1.0  --    ------------------------------------------------------------------------------------------------------------------ No results for input(s): CHOL, HDL, LDLCALC, TRIG, CHOLHDL, LDLDIRECT in the last  72 hours.  Lab Results  Component Value Date   HGBA1C 12.6 08/17/2016   ------------------------------------------------------------------------------------------------------------------ No results for input(s): TSH, T4TOTAL, T3FREE, THYROIDAB in the last 72 hours.  Invalid input(s): FREET3 ------------------------------------------------------------------------------------------------------------------ No results for input(s): VITAMINB12, FOLATE, FERRITIN, TIBC, IRON, RETICCTPCT in the last 72 hours.  Coagulation profile No results for input(s): INR, PROTIME in the last 168 hours.  No results for input(s): DDIMER in the last 72 hours.  Cardiac Enzymes No results for input(s): CKMB, TROPONINI, MYOGLOBIN in the last 168 hours.  Invalid input(s): CK ------------------------------------------------------------------------------------------------------------------ No results found for: BNP  Inpatient Medications  Scheduled Meds: . cefTRIAXone (ROCEPHIN)  IV  2 g Intravenous Q24H  . feeding supplement (GLUCERNA SHAKE)  237 mL Oral BID BM  . insulin aspart  0-15 Units Subcutaneous TID WC  . insulin aspart  0-5 Units Subcutaneous QHS  . insulin aspart  7 Units Subcutaneous TID WC  . insulin glargine  40 Units Subcutaneous QHS  . lisinopril  5 mg Oral Daily  . rivaroxaban  20 mg Oral Q supper  . simvastatin  40 mg Oral QPM  . tamsulosin  0.4 mg Oral QPC supper   Continuous Infusions: . sodium chloride 75 mL/hr at 08/19/16 0601   PRN Meds:.HYDROcodone-acetaminophen, ketorolac, morphine injection  Micro Results Recent Results (from the past 240 hour(s))  CULTURE, URINE COMPREHENSIVE     Status: None (Preliminary result)   Collection Time: 08/17/16  2:57 PM  Result Value Ref Range Status   Colony Count Greater than 100,000 CFU/mL  Preliminary   Preliminary Report GRAM NEGATIVE BACILLI ISOLATED  Preliminary  Blood Culture (routine x 2)     Status: Abnormal (Preliminary result)     Collection Time: 08/17/16  7:14 PM  Result Value Ref Range Status   Specimen Description BLOOD LEFT ARM  Final   Special Requests BOTTLES DRAWN AEROBIC AND ANAEROBIC 5CC  Final   Culture  Setup Time   Final    GRAM NEGATIVE RODS IN BOTH AEROBIC AND ANAEROBIC BOTTLES Organism ID to follow CRITICAL RESULT CALLED TO, READ BACK BY AND VERIFIED WITH: J. Scherrie November Pharm.D. 10:20 08/18/16  ([email or web address])    Culture (A)  Final    ESCHERICHIA COLI SUSCEPTIBILITIES TO FOLLOW Performed at [facility]    Report Status PENDING  Incomplete  Blood Culture (routine x 2)     Status: None (Preliminary result)   Collection Time: 08/17/16  7:14 PM  Result Value Ref Range Status   Specimen Description BLOOD RIGHT ARM  Final   Special Requests BOTTLES DRAWN AEROBIC AND ANAEROBIC 5CC  Final   Culture  Setup Time   Final    GRAM NEGATIVE RODS IN BOTH AEROBIC AND ANAEROBIC BOTTLES CRITICAL RESULT CALLED TO, READ BACK BY AND VERIFIED WITH: J. Scherrie November Pharm.D. 10:20 08/18/16  ([email or web address]) Performed at [place]  Final   Report Status PENDING  Incomplete  Blood Culture ID Panel (Reflexed)     Status: Abnormal   Collection Time: 08/17/16  7:14 PM  Result Value Ref Range Status   Enterococcus species NOT DETECTED NOT DETECTED Final   Listeria monocytogenes NOT DETECTED NOT DETECTED Final   Staphylococcus species NOT DETECTED NOT DETECTED Final   Staphylococcus aureus NOT DETECTED NOT DETECTED Final   Streptococcus species NOT DETECTED NOT DETECTED Final   Streptococcus agalactiae NOT DETECTED NOT DETECTED Final   Streptococcus pneumoniae NOT DETECTED NOT DETECTED Final   Streptococcus pyogenes NOT DETECTED NOT DETECTED Final   Acinetobacter baumannii NOT DETECTED NOT DETECTED Final   Enterobacteriaceae species DETECTED (A) NOT DETECTED Final    Comment: CRITICAL RESULT CALLED TO, READ BACK BY AND VERIFIED WITH: J. Scherrie November Pharm.D. 10:20 08/18/16 ([email or web address])     Enterobacter cloacae complex NOT DETECTED NOT DETECTED Final   Escherichia coli DETECTED (A) NOT DETECTED Final    Comment: CRITICAL RESULT CALLED TO, READ BACK BY AND VERIFIED WITH: J. Scherrie November Pharm.D. 10:20 08/18/16 ([email or web address])    Klebsiella oxytoca NOT DETECTED NOT DETECTED Final   Klebsiella pneumoniae NOT DETECTED NOT DETECTED Final   Proteus species NOT DETECTED NOT DETECTED Final   Serratia marcescens NOT DETECTED NOT DETECTED Final   Carbapenem resistance NOT DETECTED NOT DETECTED Final   Haemophilus influenzae NOT DETECTED NOT DETECTED Final   Neisseria meningitidis NOT DETECTED NOT DETECTED Final   Pseudomonas aeruginosa NOT DETECTED NOT DETECTED Final   Candida albicans NOT DETECTED NOT DETECTED Final   Candida glabrata NOT DETECTED NOT DETECTED Final   Candida krusei NOT DETECTED NOT DETECTED Final   Candida parapsilosis NOT DETECTED NOT DETECTED Final   Candida tropicalis NOT DETECTED NOT DETECTED Final    Comment: Performed at [facility]  Urine culture     Status: Abnormal   Collection Time: 08/17/16  8:47 PM  Result Value Ref Range Status   Specimen Description URINE, CLEAN CATCH  Final   Special Requests NONE  Final   Culture MULTIPLE SPECIES PRESENT, SUGGEST RECOLLECTION (A)  Final   Report Status 08/19/2016 FINAL  Final    Radiology Reports Dg Chest 2 View  Result Date: 08/17/2016 CLINICAL DATA:  Acute onset of sepsis.  Initial encounter. EXAM: CHEST  2 VIEW COMPARISON:  Chest radiograph performed 01/27/2012 FINDINGS: The lungs are well-aerated. There is mild elevation of the left hemidiaphragm, with minimal left basilar atelectasis. There is no evidence of pleural effusion or pneumothorax. The heart is normal in size; the mediastinal contour is within normal limits. No acute osseous abnormalities are seen. IMPRESSION: Mild elevation of the left hemidiaphragm, with minimal left basilar atelectasis. Lungs otherwise grossly clear. Electronically Signed   By:  Garald Balding M.D.   On: 08/17/2016 19:54   Ct Renal Stone Study  Result Date: 08/17/2016 CLINICAL DATA:  Right flank and pelvic region pain EXAM: CT ABDOMEN AND PELVIS WITHOUT CONTRAST TECHNIQUE: Multidetector CT imaging of the abdomen and pelvis was performed following the standard protocol without oral or intravenous contrast material administration. COMPARISON:  December 26, 2013 FINDINGS: Lower chest: There is mild patchy airspace consolidation in the lateral segment of the left lower lobe. There patchy areas of atelectasis in the lung bases bilaterally as well. There are foci of coronary artery calcification. Hepatobiliary: There is fatty infiltration near the fissure for the ligamentum teres. There is a 4 mm cyst in the posterior aspect of the medial segment left lobe of  the liver. No other focal liver lesions are identified. There is cholelithiasis. Gallbladder wall is not thickened. There is no biliary duct dilatation. Pancreas: There is no pancreatic mass or inflammatory focus. Spleen: No splenic lesions are evident. Adrenals/Urinary Tract: Adrenals appear normal bilaterally. There is a cyst in the upper pole of the left kidney measuring 1.4 x 1.4 cm. A somewhat larger cyst in the mid left kidney measures 3.4 x 3.2 cm. There is a cyst in the posterior mid kidney measuring 1 x 1 cm. On the left, there is a cyst arising eccentrically from the upper pole region measuring 5.8 x 3.9 cm, slightly smaller compared to prior study. A cyst posteriorly in the upper to midportion region on the right measures 2.3 x 2.3 cm. There are 2 adjacent cysts in the lower pole region, each measuring slightly greater than 1 cm. There is no evident hydronephrosis on either side. No renal or ureteral calculus is evident on either side. The urinary bladder wall is mildly thickened and slightly irregular in contour. Stomach/Bowel: There is no appreciable bowel wall or mesenteric thickening. No evident bowel obstruction. No free  air or portal venous air. Vascular/Lymphatic: There is atherosclerotic calcification in the aorta and common iliac arteries. Aorta is tortuous but shows no aneurysm. Major mesenteric vessels appear patent on this noncontrast enhanced study. There is no evident adenopathy in the abdomen or pelvis. Reproductive: The prostate is diffusely prominent and cannot be separated from the inferior urinary bladder. There are multiple calculi in the lower pole portion of the prostate. Seminal vesicles appear unremarkable in size and contour. Apart from the prostate, there is no pelvic mass or pelvic fluid collection. Other: Appendix appears normal. There is no evident ascites or abscess in the abdomen or pelvis. Musculoskeletal: There is degenerative change in the lumbar spine. There are a few small foci of sclerosis in the right iliac crest, stable from prior study and felt to represent small bone islands. No lytic or destructive bone lesions identified. No intramuscular or abdominal wall lesion evident. IMPRESSION: No renal or ureteral calculi. No hydronephrosis. Multiple renal cysts. The largest cyst on the right is smaller compared to prior study. Urinary bladder wall is mildly thickened and slightly irregular. This finding is concerning for cystitis. The prostate is enlarged with its superior aspect contiguous with the inferior urinary bladder. A well-defined fat plane between the urinary bladder and prostate not present. Question possible prostate carcinoma invading the inferior bladder. This finding may warrant direct visualization to further assess. Correlation with PSA advised as well. Calcifications noted in lower prostate consistent with prostatic calculi. No bowel obstruction.  No abscess.  Appendix appears normal. Cholelithiasis. Focus of infiltrate in the lateral segment left lower lobe, felt to be representative of pneumonia. There is aortic and iliac artery atherosclerosis. There are foci of coronary artery  calcification evident. Electronically Signed   By: Lowella Grip III M.D.   On: 08/17/2016 19:51   Time Spent in minutes  25  Vance Gather M.D. on 08/19/2016 at 3:20 PM  Between 7am to 7pm - Pager - [phone]  After 7pm go to [email or web address] - password [facility]  [facility] -  Office  [phone]

## 2016-08-19 NOTE — Consult Note (Signed)
   [facility] CM Inpatient Consult   08/19/2016  Charles Parker Jul 02, 1946 [id number]     Patient evaluated for long-term disease management services with [place] Management program. Martin Majestic to bedside to discuss and offer [place] Management services. [name] is agreeable and written consent signed. Explained to patient that he will receive post hospital transition of care calls and will be evaluated for monthly home visits. Confirmed Primary Care MD as [name].  Confirmed best contact number as [phone]. Explained that [place] Management will not interfere or replace services provided by home health.  Left [facility] Care Management packet and contact information at bedside. Will make inpatient [name] aware that patient will be followed by [place] Management post hospital discharge.   Denies having issues with obtaining medications or with transportation. Lives alone. Discussed primary focus will be for DM management. He is agreeable to this. Will refer to [place] for follow up.    Marthenia Rolling, MSN-Ed, RN,BSN [facility] Liaison [phone]

## 2016-08-19 NOTE — Progress Notes (Signed)
Inpatient Diabetes Program Recommendations  AACE/ADA: New Consensus Statement on Inpatient Glycemic Control (2015)  Target Ranges:  Prepandial:   less than 140 mg/dL      Peak postprandial:   less than 180 mg/dL (1-2 hours)      Critically ill patients:  140 - 180 mg/dL   Lab Results  Component Value Date   GLUCAP 243 (H) 08/19/2016   HGBA1C 12.6 08/17/2016    Review of Glycemic Control  Diabetes history: DM2 Outpatient Diabetes medications: Amaryl 2 mg QD Current orders for Inpatient glycemic control: Lantus 30 units QHS, Novolog 7 units tidwc + 0-15 units tidwc  On 11/15 , pt received Novolog 39 units and Lantus 30 units. Today, pt has received Novolog 27 units and blood sugars 243-290 mg/dL Needs insulin adjustment.  Inpatient Diabetes Program Recommendations:    Increase Lantus to 32 units QHS Increase meal coverage insulin to 8 units tidwc.  Spoke with pt at length about his diabetes control. Pt states he was on insulin at one time, but "they" told him to discontinue it. Has glucose meter at home, but no strips. Discussed checking blood sugars at different times and taking logbook to PCP for any needed adjustments. Discussed HgbA1C results of 12.6% and necessity of improving his glucose control. Also discussed hypoglycemia s/s and treatment. Pt seemed to have delayed response in answers to questions this Probation officer asked. Complaining of abdominal pain and winced frequently. Very strange affect. Pt stated he just wanted to feel better and he would do what was necessary to achieve this.  Will continue to follow. Discussed with RN. Needs prescription for glucose monitoring strips at discharge.  Thank you. Lorenda Peck, RD, LDN, CDE Inpatient Diabetes Coordinator [phone]

## 2016-08-20 LAB — CULTURE, BLOOD (ROUTINE X 2)

## 2016-08-20 LAB — GLUCOSE, CAPILLARY
GLUCOSE-CAPILLARY: 183 mg/dL — AB (ref 65–99)
GLUCOSE-CAPILLARY: 195 mg/dL — AB (ref 65–99)
Glucose-Capillary: 170 mg/dL — ABNORMAL HIGH (ref 65–99)
Glucose-Capillary: 192 mg/dL — ABNORMAL HIGH (ref 65–99)

## 2016-08-20 MED ORDER — BLOOD GLUCOSE METER KIT: PACK | 0 refills | Status: DC

## 2016-08-20 MED ORDER — CHLORPROMAZINE HCL 10 MG PO TABS: 10.0000 mg | ORAL_TABLET | Freq: Three times a day (TID) | ORAL | 0 refills | Status: DC | PRN

## 2016-08-20 MED ORDER — INSULIN GLARGINE 100 UNIT/ML ~~LOC~~ SOLN: 40.0000 [IU] | Freq: Every day | SUBCUTANEOUS | 0 refills | Status: DC

## 2016-08-20 MED ORDER — GLUCOSE BLOOD VI STRP: 1.0000 | ORAL_STRIP | Freq: Two times a day (BID) | 0 refills | Status: DC

## 2016-08-20 MED ORDER — "INSULIN SYRINGE 31G X 5/16"" 0.5 ML MISC": 0 refills | Status: DC

## 2016-08-20 MED ORDER — LISINOPRIL 5 MG PO TABS: 5.0000 mg | ORAL_TABLET | Freq: Every day | ORAL | 0 refills | Status: DC

## 2016-08-20 MED ORDER — INSULIN GLARGINE 100 UNIT/ML SOLOSTAR PEN: PEN_INJECTOR | SUBCUTANEOUS | 0 refills | Status: DC

## 2016-08-20 MED ORDER — INSULIN PEN NEEDLE 31G X 5 MM MISC: 0 refills | Status: DC

## 2016-08-20 MED ORDER — CEPHALEXIN 500 MG PO CAPS: 500.0000 mg | ORAL_CAPSULE | Freq: Three times a day (TID) | ORAL | 0 refills | Status: AC

## 2016-08-20 NOTE — Discharge Summary (Signed)
Physician Discharge Summary  Charles Parker:[id number] DOB: [birth year] DOA: 08/17/2016  PCP: Scarlette Calico, MD  Admit date: 08/17/2016 Discharge date: 08/20/2016  Admitted From: Home Disposition: Home   Recommendations for Outpatient Follow-up:  1. Follow up with PCP and endocrinology as soon as possible. 2. Will require ongoing management of IDDM. Started on lantus 40u nightly, and received extensive diabetes education. 3. Monitor BP: lisinopril 52m started for nephropathy. 4. Please obtain CBC in one week. Completing 2 weeks of abx for E coli bacteremia from urinary source. 5. Follow up with urology as needed for BPH. Flomax started in hospital.  Home Health: [facility] Equipment/Devices: None  Discharge Condition: Stable CODE STATUS: Full Diet recommendation: Carbohydrate limited (counseled on this)  Brief/Interim Summary: 70year old male with history of uncontrolled T2DM, HTN, clotting disorder on xarelto, OSA and medical nonadherence who presented to his PCP's office with dysuria, urinary frequency and flank pain for past 5 days. Patient was found to have UTI, CBG of 400 and ketones in urine. PCP was concerned for DKA and sent patient to the ED.  In the ED he was tachycardic and hypertensive with low-grade temperature. WBC of 10.4, glucose 399, anion gap of 18, creatinine 1.2. Lactate elevated to 2.97. Patient admitted for mild DKA and acute pyelonephritis. He has remained afebrile (Tmax in ED 99.73F in ED, trended downward), urine, and subsequently blood, cultures grew E. coli sensitive to all tested abx except ampicillin and unasyn. He demonstrated the ability to tolerate po keflex and was discharged on this for a total course of 2 weeks.   Blood glucose was controlled with basal/bolus insulin, titrated upward over 3 days, ultimately to lantus 40 units qHS, and 7u + SSI TIDAC and SSI HS. He has remained hyperglycemic in setting of infection without recurrence of acidosis or gap.    Discharge Diagnoses:  Principal Problem:   Acute pyelonephritis Active Problems:   Tubulovillous adenoma of colon with high grade dysplasia (rectosigmoid) s/p LAR with bladder repair 02/03/12.   Hypertension   DVT, lower extremity, proximal ([place]   Diabetes mellitus with ophthalmic complication [place]   Prostate mass   E coli bacteremia  Sepsis due to acute pyelonephritis with E. coli bacteremia: Sepsis resolved  - Transition empiric rocephin to keflex per culture sensitivities.  DKA in setting of uncontrolled T2DM with macular degeneration and nephropathy: HbA1c persistently > 12% in past year. Seen by endocrinologist Dr. GCruzita Ledererin March this year. Stopped invokana and jTongadue to price. Stopped metformin due to diarrhea. Also admitted to not taking toujeo or lantus previously prescribed. Only taking amaryl PTA.  - DKA resolved: Increasing Lantus 10>30>40 units qHS with pre-meal aspart 7 units TIDAC + SSI qHS. Remains mildly hyperglycemic without gap/acidosis. Due to concern for low health literacy, high risk of nonadherence to multiple medication regimen, will start ONLY LANTUS 40 units nightly at discharge. - Stop amaryl  - Add low-dose lisinopril for proteinuria. - Diabetes coordinator consulted for diabetes education - Care management consulted, [facility].   Left lower lobe infiltrate on CT: No evidence of pneumonia at this time, continue to suspect this is atelectasis as source is urinary.   Prostate mass: History of BPH. Abdominal CT findings concerning for possible malignancy were discussed with Dr. BAlinda Moneywho believes malignancy highly unlikely, more likely BPH. PSA is normal.  - Recommend outpatient follow-up with urology, Dr. BAlinda Money  - Start patient on Flomax. - Was never obstructed while inpatient.  Hiccups:  Persistent since admission, likely from diaphragmatic irritation. Improved with chlorpromazine. - Continue low dose  chlorpromazine prn, anticipate improvement with resolution of underlying problem.  History of proximal DVT with clotting disorder. Continue Xarelto.  Dyslipidemia Continued statin.  Renal cysts: CT renal stone study showed no urolithiasis but did show thickened bladder consistent with cystitis and multiple renal cysts (see radiology results below) with interval diminution of largest cyst on the right from prior study.  Discharge Instructions Discharge Instructions    AMB Referral to [facility]    Complete by:  As directed    Please assign to [place] for DM management. Written consent obtained. Currently at [facility]. Please call with questions. Marthenia Rolling, MSN-Ed, RN,BSN [facility] [phone]   Reason for consult:  Please assign to [facility] [name]   Diagnoses of:  Diabetes   Expected date of contact:  1-3 days (reserved for hospital discharges)   Call MD for:  difficulty breathing, headache or visual disturbances    Complete by:  As directed    Call MD for:  persistant nausea and vomiting    Complete by:  As directed    Call MD for:  severe uncontrolled pain    Complete by:  As directed    Call MD for:  temperature >100.4    Complete by:  As directed    Discharge instructions    Complete by:  As directed    You were admitted for an infection in your kidney which is improving on antibiotics. You will be started on the antibiotic that will treat that.  - Continue taking keflex three times daily for the next 11 days. Do not stop taking this until you have run out of pills.  - You should come back to the hospital if you feel fever.   You were also admitted with DKA due to uncontrolled high blood sugar from diabetes. It is very important that you take medications for this as directed.  - Lantus insulin was started while you were here as was novolog. Because you will need ongoing teaching and management of diabetes, you NEED TO FOLLOW UP WITH  ENDOCRINOLOGIST, DR. GHERGHE.  - Lantus insulin (long-acting) is prescribed at discharge. You will have a copay with this, but it should be $20 or less. If you are unable to afford your medications you must let us know. Take 40 units every night, starting tonight. No novolog (short-acting) insulin is prescribed at this time, but you will need to follow up as this will be necessary.  - Check your blood sugar in the morning and at every meal, write down the number, and bring that record of blood sugar values to your follow up appointments.  - STOP taking amaryl as this can make your blood sugar too low with insulin.  - YOU received diabetes education here and will receive teaching about diabetes management at discharge. - Avoid carbohydrate-rich foods like we discussed.  These prescriptions were printed so you can fill them at your local pharmacy. For ongoing prescriptions, your outpatient doctors will send them into [facility].   Increase activity slowly    Complete by:  As directed        Medication List    STOP taking these medications   glimepiride 2 MG tablet Commonly known as:  AMARYL     TAKE these medications   B-D SINGLE USE SWABS REGULAR Pads Use as directed Dx E11.9   cephALEXin 500 MG capsule Commonly known  as:  KEFLEX Take 1 capsule (500 mg total) by mouth 3 (three) times daily.   chlorproMAZINE 10 MG tablet Commonly known as:  THORAZINE Take 1 tablet (10 mg total) by mouth 3 (three) times daily as needed for hiccoughs.   glucose blood test strip Commonly known as:  TRUE METRIX BLOOD GLUCOSE TEST 1 each by Other route 2 (two) times daily. Use to check blood sugars twice a day Dx E11.9   insulin glargine 100 UNIT/ML injection Commonly known as:  LANTUS Inject 0.4 mLs (40 Units total) into the skin at bedtime.   lisinopril 5 MG tablet Commonly known as:  PRINIVIL,ZESTRIL Take 1 tablet (5 mg total) by mouth daily. Start taking on:  08/21/2016   MULTIPLE VITAMIN  PO Take 1 tablet by mouth daily.   rivaroxaban 20 MG Tabs tablet Commonly known as:  XARELTO Take 1 tablet (20 mg total) by mouth daily with supper.   simvastatin 40 MG tablet Commonly known as:  ZOCOR Take 1 tablet (40 mg total) by mouth every evening.   TRUE METRIX METER w/Device Kit Use as directed  Dx E11.9   TRUEPLUS LANCETS 33G Misc Use to help check blood sugars twice a day Dx E11.9      Follow-up Information    Scarlette Calico, MD. Schedule an appointment as soon as possible for a visit.   Specialty:  Internal Medicine Contact information: [address] [place] [address] [phone]          Allergies  Allergen Reactions  . Metformin And Related     diarrhea  . Codeine Rash    All over the body    Consultations:  None  Procedures/Studies: Dg Chest 2 View  Result Date: 08/17/2016 CLINICAL DATA:  Acute onset of sepsis.  Initial encounter. EXAM: CHEST  2 VIEW COMPARISON:  Chest radiograph performed 01/27/2012 FINDINGS: The lungs are well-aerated. There is mild elevation of the left hemidiaphragm, with minimal left basilar atelectasis. There is no evidence of pleural effusion or pneumothorax. The heart is normal in size; the mediastinal contour is within normal limits. No acute osseous abnormalities are seen. IMPRESSION: Mild elevation of the left hemidiaphragm, with minimal left basilar atelectasis. Lungs otherwise grossly clear. Electronically Signed   By: Garald Balding M.D.   On: 08/17/2016 19:54   Ct Renal Stone Study  Result Date: 08/17/2016 CLINICAL DATA:  Right flank and pelvic region pain EXAM: CT ABDOMEN AND PELVIS WITHOUT CONTRAST TECHNIQUE: Multidetector CT imaging of the abdomen and pelvis was performed following the standard protocol without oral or intravenous contrast material administration. COMPARISON:  December 26, 2013 FINDINGS: Lower chest: There is mild patchy airspace consolidation in the lateral segment of the left lower  lobe. There patchy areas of atelectasis in the lung bases bilaterally as well. There are foci of coronary artery calcification. Hepatobiliary: There is fatty infiltration near the fissure for the ligamentum teres. There is a 4 mm cyst in the posterior aspect of the medial segment left lobe of the liver. No other focal liver lesions are identified. There is cholelithiasis. Gallbladder wall is not thickened. There is no biliary duct dilatation. Pancreas: There is no pancreatic mass or inflammatory focus. Spleen: No splenic lesions are evident. Adrenals/Urinary Tract: Adrenals appear normal bilaterally. There is a cyst in the upper pole of the left kidney measuring 1.4 x 1.4 cm. A somewhat larger cyst in the mid left kidney measures 3.4 x 3.2 cm. There is a cyst in the posterior  mid kidney measuring 1 x 1 cm. On the left, there is a cyst arising eccentrically from the upper pole region measuring 5.8 x 3.9 cm, slightly smaller compared to prior study. A cyst posteriorly in the upper to midportion region on the right measures 2.3 x 2.3 cm. There are 2 adjacent cysts in the lower pole region, each measuring slightly greater than 1 cm. There is no evident hydronephrosis on either side. No renal or ureteral calculus is evident on either side. The urinary bladder wall is mildly thickened and slightly irregular in contour. Stomach/Bowel: There is no appreciable bowel wall or mesenteric thickening. No evident bowel obstruction. No free air or portal venous air. Vascular/Lymphatic: There is atherosclerotic calcification in the aorta and common iliac arteries. Aorta is tortuous but shows no aneurysm. Major mesenteric vessels appear patent on this noncontrast enhanced study. There is no evident adenopathy in the abdomen or pelvis. Reproductive: The prostate is diffusely prominent and cannot be separated from the inferior urinary bladder. There are multiple calculi in the lower pole portion of the prostate. Seminal vesicles appear  unremarkable in size and contour. Apart from the prostate, there is no pelvic mass or pelvic fluid collection. Other: Appendix appears normal. There is no evident ascites or abscess in the abdomen or pelvis. Musculoskeletal: There is degenerative change in the lumbar spine. There are a few small foci of sclerosis in the right iliac crest, stable from prior study and felt to represent small bone islands. No lytic or destructive bone lesions identified. No intramuscular or abdominal wall lesion evident. IMPRESSION: No renal or ureteral calculi. No hydronephrosis. Multiple renal cysts. The largest cyst on the right is smaller compared to prior study. Urinary bladder wall is mildly thickened and slightly irregular. This finding is concerning for cystitis. The prostate is enlarged with its superior aspect contiguous with the inferior urinary bladder. A well-defined fat plane between the urinary bladder and prostate not present. Question possible prostate carcinoma invading the inferior bladder. This finding may warrant direct visualization to further assess. Correlation with PSA advised as well. Calcifications noted in lower prostate consistent with prostatic calculi. No bowel obstruction.  No abscess.  Appendix appears normal. Cholelithiasis. Focus of infiltrate in the lateral segment left lower lobe, felt to be representative of pneumonia. There is aortic and iliac artery atherosclerosis. There are foci of coronary artery calcification evident. Electronically Signed   By: Lowella Grip III M.D.   On: 08/17/2016 19:51     Subjective: He reports significant improvement in right flank pain. Still there, but tolerable. No fevers, chills, cough, dyspnea, chest pain. Voiding normally. Ready to go home. Family present at bedside aware of the challenge that faces him to remain adherent to medications at discharge.  Discharge Exam: Vitals:   08/20/16 0644 08/20/16 1442  BP: 140/86 112/63  Pulse: (!) 106 (!) 111   Resp: 17 19  Temp: 99 F (37.2 C) 98.7 F (37.1 C)   General: Pt is alert, awake, not in acute distress Cardiovascular: Tachycardic rate, regular rhythm, S1/S2 +, no rubs, no gallops Respiratory: Nonlabored breathing ambient air, CTA bilaterally, no wheezing, no rhonchi Abdominal: Soft, nontender without distention. Normoactive bowel sounds. +Right flank tenderness. Extremities: no edema, no cyanosis  The results of significant diagnostics from this hospitalization (including imaging, microbiology, ancillary and laboratory) are listed below for reference.    Microbiology: Recent Results (from the past 240 hour(s))  CULTURE, URINE COMPREHENSIVE     Status: None   Collection Time: 08/17/16  2:57 PM  Result Value Ref Range Status   Culture ESCHERICHIA COLI  Final   Colony Count Greater than 100,000 CFU/mL  Final   Organism ID, Bacteria ESCHERICHIA COLI  Final      Susceptibility   Escherichia coli -  (no method available)    AMPICILLIN >=32 Resistant     AMOX/CLAVULANIC 8 Sensitive     AMPICILLIN/SULBACTAM 16 Intermediate     IMIPENEM <=0.25 Sensitive     CEFAZOLIN <=4 Not Reportable     CEFTRIAXONE <=1 Sensitive     CEFTAZIDIME <=1 Sensitive     CEFEPIME <=1 Sensitive     GENTAMICIN <=1 Sensitive     TOBRAMYCIN <=1 Sensitive     CIPROFLOXACIN <=0.25 Sensitive     LEVOFLOXACIN <=0.12 Sensitive     NITROFURANTOIN* <=16 Sensitive      * NR=NOT REPORTABLE,SEE COMMENTORAL therapy:A cefazolin MIC of <32 predicts susceptibility to the oral agents cefaclor,cefdinir,cefpodoxime,cefprozil,cefuroxime,cephalexin,and loracarbef when used for therapy of uncomplicated UTIs due to E.coli,K.pneumomiae,and P.mirabilis. PARENTERAL therapy: A cefazolinMIC of >8 indicates resistance to parenteralcefazolin. An alternate test method must beperformed to confirm susceptibility to parenteralcefazolin.  Blood Culture (routine x 2)     Status: Abnormal   Collection Time: 08/17/16  7:14 PM  Result Value  Ref Range Status   Specimen Description BLOOD LEFT ARM  Final   Special Requests BOTTLES DRAWN AEROBIC AND ANAEROBIC 5CC  Final   Culture  Setup Time   Final    GRAM NEGATIVE RODS IN BOTH AEROBIC AND ANAEROBIC BOTTLES CRITICAL RESULT CALLED TO, READ BACK BY AND VERIFIED WITH: J. Scherrie November Pharm.D. 10:20 08/18/16  ([email or web address]) Performed at [facility] (A)  Final   Report Status 08/20/2016 FINAL  Final   Organism ID, Bacteria ESCHERICHIA COLI  Final      Susceptibility   Escherichia coli - MIC*    AMPICILLIN >=32 RESISTANT Resistant     CEFAZOLIN <=4 SENSITIVE Sensitive     CEFEPIME <=1 SENSITIVE Sensitive     CEFTAZIDIME <=1 SENSITIVE Sensitive     CEFTRIAXONE <=1 SENSITIVE Sensitive     CIPROFLOXACIN <=0.25 SENSITIVE Sensitive     GENTAMICIN <=1 SENSITIVE Sensitive     IMIPENEM <=0.25 SENSITIVE Sensitive     TRIMETH/SULFA <=20 SENSITIVE Sensitive     AMPICILLIN/SULBACTAM 16 INTERMEDIATE Intermediate     PIP/TAZO <=4 SENSITIVE Sensitive     Extended ESBL NEGATIVE Sensitive     * ESCHERICHIA COLI  Blood Culture (routine x 2)     Status: Abnormal   Collection Time: 08/17/16  7:14 PM  Result Value Ref Range Status   Specimen Description BLOOD RIGHT ARM  Final   Special Requests BOTTLES DRAWN AEROBIC AND ANAEROBIC 5CC  Final   Culture  Setup Time   Final    GRAM NEGATIVE RODS IN BOTH AEROBIC AND ANAEROBIC BOTTLES CRITICAL RESULT CALLED TO, READ BACK BY AND VERIFIED WITH: J. Scherrie November Pharm.D. 10:20 08/18/16  ([email or web address])    Culture (A)  Final    ESCHERICHIA COLI SUSCEPTIBILITIES PERFORMED ON PREVIOUS CULTURE WITHIN THE LAST 5 DAYS. Performed at [facility]    Report Status 08/20/2016 FINAL  Final  Blood Culture ID Panel (Reflexed)     Status: Abnormal   Collection Time: 08/17/16  7:14 PM  Result Value Ref Range Status   Enterococcus species NOT DETECTED NOT DETECTED Final   Listeria monocytogenes NOT DETECTED NOT DETECTED Final    Staphylococcus species NOT DETECTED NOT DETECTED Final  Staphylococcus aureus NOT DETECTED NOT DETECTED Final   Streptococcus species NOT DETECTED NOT DETECTED Final   Streptococcus agalactiae NOT DETECTED NOT DETECTED Final   Streptococcus pneumoniae NOT DETECTED NOT DETECTED Final   Streptococcus pyogenes NOT DETECTED NOT DETECTED Final   Acinetobacter baumannii NOT DETECTED NOT DETECTED Final   Enterobacteriaceae species DETECTED (A) NOT DETECTED Final    Comment: CRITICAL RESULT CALLED TO, READ BACK BY AND VERIFIED WITH: J. Scherrie November Pharm.D. 10:20 08/18/16 ([email or web address])    Enterobacter cloacae complex NOT DETECTED NOT DETECTED Final   Escherichia coli DETECTED (A) NOT DETECTED Final    Comment: CRITICAL RESULT CALLED TO, READ BACK BY AND VERIFIED WITH: J. Scherrie November Pharm.D. 10:20 08/18/16 ([email or web address])    Klebsiella oxytoca NOT DETECTED NOT DETECTED Final   Klebsiella pneumoniae NOT DETECTED NOT DETECTED Final   Proteus species NOT DETECTED NOT DETECTED Final   Serratia marcescens NOT DETECTED NOT DETECTED Final   Carbapenem resistance NOT DETECTED NOT DETECTED Final   Haemophilus influenzae NOT DETECTED NOT DETECTED Final   Neisseria meningitidis NOT DETECTED NOT DETECTED Final   Pseudomonas aeruginosa NOT DETECTED NOT DETECTED Final   Candida albicans NOT DETECTED NOT DETECTED Final   Candida glabrata NOT DETECTED NOT DETECTED Final   Candida krusei NOT DETECTED NOT DETECTED Final   Candida parapsilosis NOT DETECTED NOT DETECTED Final   Candida tropicalis NOT DETECTED NOT DETECTED Final    Comment: Performed at [facility]  Urine culture     Status: Abnormal   Collection Time: 08/17/16  8:47 PM  Result Value Ref Range Status   Specimen Description URINE, CLEAN CATCH  Final   Special Requests NONE  Final   Culture MULTIPLE SPECIES PRESENT, SUGGEST RECOLLECTION (A)  Final   Report Status 08/19/2016 FINAL  Final     Labs: BNP (last 3 results) No results for input(s): BNP in  the last 8760 hours. Basic Metabolic Panel:  Recent Labs Lab 08/17/16 1520 08/18/16 0819 08/19/16 0430  NA 129* 132* 134*  K 4.3 4.0 3.6  CL 91* 101 103  CO2 20* 22 23  GLUCOSE 399* 339* 266*  BUN 20 23* 25*  CREATININE 1.22 1.01 1.03  CALCIUM 9.2 8.3* 8.3*   Liver Function Tests:  Recent Labs Lab 08/18/16 0819  AST 30  ALT 26  ALKPHOS 114  BILITOT 1.0  PROT 7.0  ALBUMIN 3.1*   No results for input(s): LIPASE, AMYLASE in the last 168 hours. No results for input(s): AMMONIA in the last 168 hours. CBC:  Recent Labs Lab 08/17/16 1520  WBC 10.4  HGB 14.7  HCT 43.1  MCV 92.1  PLT 150   Cardiac Enzymes: No results for input(s): CKTOTAL, CKMB, CKMBINDEX, TROPONINI in the last 168 hours. BNP: Invalid input(s): POCBNP CBG:  Recent Labs Lab 08/19/16 1635 08/19/16 2123 08/20/16 0741 08/20/16 1028 08/20/16 1150  GLUCAP 212* 305* 170* 183* 192*   D-Dimer No results for input(s): DDIMER in the last 72 hours. Hgb A1c No results for input(s): HGBA1C in the last 72 hours. Lipid Profile No results for input(s): CHOL, HDL, LDLCALC, TRIG, CHOLHDL, LDLDIRECT in the last 72 hours. Thyroid function studies No results for input(s): TSH, T4TOTAL, T3FREE, THYROIDAB in the last 72 hours.  Invalid input(s): FREET3 Anemia work up No results for input(s): VITAMINB12, FOLATE, FERRITIN, TIBC, IRON, RETICCTPCT in the last 72 hours. Urinalysis    Component Value Date/Time   COLORURINE YELLOW 08/17/2016 2047   APPEARANCEUR CLOUDY (A) 08/17/2016 2047   LABSPEC 1.031 (  H) 08/17/2016 2047   PHURINE 5.5 08/17/2016 2047   GLUCOSEU >1000 (A) 08/17/2016 2047   GLUCOSEU >=1000 (A) 11/11/2015 1437   HGBUR MODERATE (A) 08/17/2016 2047   BILIRUBINUR NEGATIVE 08/17/2016 2047   BILIRUBINUR negative 08/17/2016 1359   KETONESUR 15 (A) 08/17/2016 2047   PROTEINUR 30 (A) 08/17/2016 2047   UROBILINOGEN 0.2 08/17/2016 1359   UROBILINOGEN 0.2 11/11/2015 1437   NITRITE POSITIVE (A)  08/17/2016 2047   LEUKOCYTESUR NEGATIVE 08/17/2016 2047   Sepsis Labs Invalid input(s): PROCALCITONIN,  WBC,  LACTICIDVEN Microbiology Recent Results (from the past 240 hour(s))  CULTURE, URINE COMPREHENSIVE     Status: None   Collection Time: 08/17/16  2:57 PM  Result Value Ref Range Status   Culture ESCHERICHIA COLI  Final   Colony Count Greater than 100,000 CFU/mL  Final   Organism ID, Bacteria ESCHERICHIA COLI  Final      Susceptibility   Escherichia coli -  (no method available)    AMPICILLIN >=32 Resistant     AMOX/CLAVULANIC 8 Sensitive     AMPICILLIN/SULBACTAM 16 Intermediate     IMIPENEM <=0.25 Sensitive     CEFAZOLIN <=4 Not Reportable     CEFTRIAXONE <=1 Sensitive     CEFTAZIDIME <=1 Sensitive     CEFEPIME <=1 Sensitive     GENTAMICIN <=1 Sensitive     TOBRAMYCIN <=1 Sensitive     CIPROFLOXACIN <=0.25 Sensitive     LEVOFLOXACIN <=0.12 Sensitive     NITROFURANTOIN* <=16 Sensitive      * NR=NOT REPORTABLE,SEE COMMENTORAL therapy:A cefazolin MIC of <32 predicts susceptibility to the oral agents cefaclor,cefdinir,cefpodoxime,cefprozil,cefuroxime,cephalexin,and loracarbef when used for therapy of uncomplicated UTIs due to E.coli,K.pneumomiae,and P.mirabilis. PARENTERAL therapy: A cefazolinMIC of >8 indicates resistance to parenteralcefazolin. An alternate test method must beperformed to confirm susceptibility to parenteralcefazolin.  Blood Culture (routine x 2)     Status: Abnormal   Collection Time: 08/17/16  7:14 PM  Result Value Ref Range Status   Specimen Description BLOOD LEFT ARM  Final   Special Requests BOTTLES DRAWN AEROBIC AND ANAEROBIC 5CC  Final   Culture  Setup Time   Final    GRAM NEGATIVE RODS IN BOTH AEROBIC AND ANAEROBIC BOTTLES CRITICAL RESULT CALLED TO, READ BACK BY AND VERIFIED WITH: J. Scherrie November Pharm.D. 10:20 08/18/16  ([email or web address]) Performed at [place] (A)  Final   Report Status 08/20/2016 FINAL  Final   Organism  ID, Bacteria ESCHERICHIA COLI  Final      Susceptibility   Escherichia coli - MIC*    AMPICILLIN >=32 RESISTANT Resistant     CEFAZOLIN <=4 SENSITIVE Sensitive     CEFEPIME <=1 SENSITIVE Sensitive     CEFTAZIDIME <=1 SENSITIVE Sensitive     CEFTRIAXONE <=1 SENSITIVE Sensitive     CIPROFLOXACIN <=0.25 SENSITIVE Sensitive     GENTAMICIN <=1 SENSITIVE Sensitive     IMIPENEM <=0.25 SENSITIVE Sensitive     TRIMETH/SULFA <=20 SENSITIVE Sensitive     AMPICILLIN/SULBACTAM 16 INTERMEDIATE Intermediate     PIP/TAZO <=4 SENSITIVE Sensitive     Extended ESBL NEGATIVE Sensitive     * ESCHERICHIA COLI  Blood Culture (routine x 2)     Status: Abnormal   Collection Time: 08/17/16  7:14 PM  Result Value Ref Range Status   Specimen Description BLOOD RIGHT ARM  Final   Special Requests BOTTLES DRAWN AEROBIC AND ANAEROBIC 5CC  Final   Culture  Setup Time   Final  GRAM NEGATIVE RODS IN BOTH AEROBIC AND ANAEROBIC BOTTLES CRITICAL RESULT CALLED TO, READ BACK BY AND VERIFIED WITH: J. Scherrie November Pharm.D. 10:20 08/18/16  ([email or web address])    Culture (A)  Final    ESCHERICHIA COLI SUSCEPTIBILITIES PERFORMED ON PREVIOUS CULTURE WITHIN THE LAST 5 DAYS. Performed at [facility]    Report Status 08/20/2016 FINAL  Final  Blood Culture ID Panel (Reflexed)     Status: Abnormal   Collection Time: 08/17/16  7:14 PM  Result Value Ref Range Status   Enterococcus species NOT DETECTED NOT DETECTED Final   Listeria monocytogenes NOT DETECTED NOT DETECTED Final   Staphylococcus species NOT DETECTED NOT DETECTED Final   Staphylococcus aureus NOT DETECTED NOT DETECTED Final   Streptococcus species NOT DETECTED NOT DETECTED Final   Streptococcus agalactiae NOT DETECTED NOT DETECTED Final   Streptococcus pneumoniae NOT DETECTED NOT DETECTED Final   Streptococcus pyogenes NOT DETECTED NOT DETECTED Final   Acinetobacter baumannii NOT DETECTED NOT DETECTED Final   Enterobacteriaceae species DETECTED (A) NOT DETECTED Final     Comment: CRITICAL RESULT CALLED TO, READ BACK BY AND VERIFIED WITH: J. Scherrie November Pharm.D. 10:20 08/18/16 ([email or web address])    Enterobacter cloacae complex NOT DETECTED NOT DETECTED Final   Escherichia coli DETECTED (A) NOT DETECTED Final    Comment: CRITICAL RESULT CALLED TO, READ BACK BY AND VERIFIED WITH: J. Scherrie November Pharm.D. 10:20 08/18/16 ([email or web address])    Klebsiella oxytoca NOT DETECTED NOT DETECTED Final   Klebsiella pneumoniae NOT DETECTED NOT DETECTED Final   Proteus species NOT DETECTED NOT DETECTED Final   Serratia marcescens NOT DETECTED NOT DETECTED Final   Carbapenem resistance NOT DETECTED NOT DETECTED Final   Haemophilus influenzae NOT DETECTED NOT DETECTED Final   Neisseria meningitidis NOT DETECTED NOT DETECTED Final   Pseudomonas aeruginosa NOT DETECTED NOT DETECTED Final   Candida albicans NOT DETECTED NOT DETECTED Final   Candida glabrata NOT DETECTED NOT DETECTED Final   Candida krusei NOT DETECTED NOT DETECTED Final   Candida parapsilosis NOT DETECTED NOT DETECTED Final   Candida tropicalis NOT DETECTED NOT DETECTED Final    Comment: Performed at [facility]  Urine culture     Status: Abnormal   Collection Time: 08/17/16  8:47 PM  Result Value Ref Range Status   Specimen Description URINE, CLEAN CATCH  Final   Special Requests NONE  Final   Culture MULTIPLE SPECIES PRESENT, SUGGEST RECOLLECTION (A)  Final   Report Status 08/19/2016 FINAL  Final    Time coordinating discharge: Over 69 minutes  Vance Gather, MD  [facility] 08/20/2016, 2:57 PM Pager [phone]  If 7PM-7AM, please contact night-coverage [email or web address] Password [email or web address]

## 2016-08-20 NOTE — Progress Notes (Signed)
Inpatient Diabetes Program Recommendations  AACE/ADA: New Consensus Statement on Inpatient Glycemic Control (2015)  Target Ranges:  Prepandial:   less than 140 mg/dL      Peak postprandial:   less than 180 mg/dL (1-2 hours)      Critically ill patients:  140 - 180 mg/dL   Lab Results  Component Value Date   GLUCAP 195 (H) 08/20/2016   HGBA1C 12.6 08/17/2016    In review of patient record, patient's PCP prescribed meter supplies before patient's admission on 11/14. Patient has used Lantus Solastar pen in the past. [facility] to follow patient for DM management. Have noted patient receiving calls from health coach in the past and has laughed and hung up. Spoke with Dr. Bonner Puna about patient and d/c orders and needs.   Thanks,  Tama Headings RN, MSN, [facility] Inpatient Diabetes Coordinator Team Pager [phone] (8a-5p)

## 2016-08-20 NOTE — Progress Notes (Signed)
Educated patient & brother regarding d/c instructions and medications especially diabetic medications. Patient and brother verbalized understanding.

## 2016-08-23 ENCOUNTER — Other Ambulatory Visit: Payer: Self-pay

## 2016-08-24 NOTE — Patient Outreach (Signed)
  Telephone conversation with patient for introduction while he was in the acute care setting. Patient informed this [name] will be folliwng ime and would be In contact within 24-72 hours post discharge.  Plan: Transition of care call within 24-72 hours post discharge

## 2016-08-24 NOTE — Patient Outreach (Signed)
[facility]) Care Management  08/24/2016   Charles Parker 06-22-1946 [id number]  Subjective:  I just got out of the hospital yesterday. I had [place] to the dentist today.  Objective:  Telephone encounter  Current Medications:  Current Outpatient Prescriptions  Medication Sig Dispense Refill  . Alcohol Swabs (B-D SINGLE USE SWABS REGULAR) PADS Use as directed Dx E11.9 180 each 3  . Blood Glucose Monitoring Suppl (TRUE METRIX METER) W/DEVICE KIT Use as directed  Dx E11.9 1 kit 0  . cephALEXin (KEFLEX) 500 MG capsule Take 1 capsule (500 mg total) by mouth 3 (three) times daily. 33 capsule 0  . chlorproMAZINE (THORAZINE) 10 MG tablet Take 1 tablet (10 mg total) by mouth 3 (three) times daily as needed for hiccoughs. 30 tablet 0  . glucose blood (TRUE METRIX BLOOD GLUCOSE TEST) test strip 1 each by Other route 2 (two) times daily. Use to check blood sugars twice a day Dx E11.9 180 each 0  . Insulin Glargine (LANTUS SOLOSTAR) 100 UNIT/ML Solostar Pen Inject 40 units every night at bedtime 15 mL 0  . Insulin Pen Needle 31G X 5 MM MISC Administer lantus once nightly 90 each 0  . lisinopril (PRINIVIL,ZESTRIL) 5 MG tablet Take 1 tablet (5 mg total) by mouth daily. 30 tablet 0  . MULTIPLE VITAMIN PO Take 1 tablet by mouth daily.     . rivaroxaban (XARELTO) 20 MG TABS tablet Take 1 tablet (20 mg total) by mouth daily with supper. 90 tablet 0  . simvastatin (ZOCOR) 40 MG tablet Take 1 tablet (40 mg total) by mouth every evening. 90 tablet 3  . TRUEPLUS LANCETS 33G MISC Use to help check blood sugars twice a day Dx E11.9 180 each 3   Current Facility-Administered Medications  Medication Dose Route Frequency Provider Last Rate Last Dose  . 0.9 %  sodium chloride infusion  500 mL Intravenous Continuous Manus Gunning, MD        Functional Status:  In your present state of health, do you have any difficulty performing the following activities: 08/23/2016 08/17/2016   Hearing? N N  Vision? Y N  Difficulty concentrating or making decisions? Y N  Walking or climbing stairs? Y Y  Dressing or bathing? N N  Doing errands, shopping? Y N  Preparing Food and eating ? N -  Using the Toilet? N -  In the past six months, have you accidently leaked urine? N -  Do you have problems with loss of bowel control? N -  Managing your Medications? Y -  Managing your Finances? N -  Housekeeping or managing your Housekeeping? Y -  Some recent data might be hidden    Fall/Depression Screening: PHQ 2/9 Scores 08/24/2016 01/01/2016 08/18/2015 07/17/2015 07/14/2015 11/02/2014 11/01/2014  PHQ - 2 Score 0 0 0 0 0 0 0   [facility] CM Care Plan Problem One   Flowsheet Row Most Recent Value  Care Plan Problem One  Patient recently discharged from acute care  Role Documenting the Problem One  Care Management [place] for Problem One  Active  [facility] Long Term Goal (31-90 days)  In the next 31 days, patient will hae not acute care admissions for hyperglycemia  [facility] Long Term Goal Start Date  08/23/16  Interventions for Problem One Long Term Goal  transition of care call made to assess needs post acute care discahrge.    [facility] CM Short Term Goal #1 (0-30 days)  In the  next 21 days, patient will meet iwth [place] care coordinator for diabetes education  [facility] CM Short Term Goal #1 Start Date  08/24/16  Interventions for Short Term Goal #1  transition of care call completed  [facility] CM Short Term Goal #2 (0-30 days)  In the next 21 days, patient will measure and record his blood glucose level 14 out of 21 times.    [facility] CM Short Term Goal #2 Start Date  08/23/16  Interventions for Short Term Goal #2  collaboration to create patient's community care management plan of care.       Assessment:   Recent hospitalization for hyperglycemia. Patient lacks knowledge, has financial restraints related to beign able to afford foods to support diabetic diet. Patient community resources for  food, resources to supplement his diet.  Plan:  Home visit within the next 21 days for community care coordination

## 2016-08-30 ENCOUNTER — Encounter: Payer: Self-pay | Admitting: Internal Medicine

## 2016-08-30 ENCOUNTER — Ambulatory Visit (INDEPENDENT_AMBULATORY_CARE_PROVIDER_SITE_OTHER): Payer: Commercial Managed Care - HMO | Admitting: Internal Medicine

## 2016-08-30 ENCOUNTER — Emergency Department (HOSPITAL_COMMUNITY)
Admission: EM | Admit: 2016-08-30 | Discharge: 2016-08-31 | Disposition: A | Discharge status: Home / Self Care | Payer: Commercial Managed Care - HMO | Attending: Emergency Medicine | Admitting: Emergency Medicine

## 2016-08-30 ENCOUNTER — Other Ambulatory Visit (INDEPENDENT_AMBULATORY_CARE_PROVIDER_SITE_OTHER): Payer: Commercial Managed Care - HMO

## 2016-08-30 VITALS — BP 160/90 | HR 110 | Temp 98.3°F | Resp 24 | Ht 72.0 in | Wt 183.0 lb

## 2016-08-30 DIAGNOSIS — I252 Old myocardial infarction: Secondary | ICD-10-CM | POA: Insufficient documentation

## 2016-08-30 DIAGNOSIS — E08319 Diabetes mellitus due to underlying condition with unspecified diabetic retinopathy without macular edema: Secondary | ICD-10-CM

## 2016-08-30 DIAGNOSIS — R7881 Bacteremia: Secondary | ICD-10-CM | POA: Diagnosis not present

## 2016-08-30 DIAGNOSIS — I10 Essential (primary) hypertension: Secondary | ICD-10-CM

## 2016-08-30 DIAGNOSIS — N10 Acute tubulo-interstitial nephritis: Secondary | ICD-10-CM | POA: Diagnosis not present

## 2016-08-30 DIAGNOSIS — N390 Urinary tract infection, site not specified: Secondary | ICD-10-CM | POA: Diagnosis not present

## 2016-08-30 DIAGNOSIS — E119 Type 2 diabetes mellitus without complications: Secondary | ICD-10-CM | POA: Insufficient documentation

## 2016-08-30 DIAGNOSIS — B9620 Unspecified Escherichia coli [E. coli] as the cause of diseases classified elsewhere: Secondary | ICD-10-CM

## 2016-08-30 DIAGNOSIS — R109 Unspecified abdominal pain: Secondary | ICD-10-CM | POA: Diagnosis not present

## 2016-08-30 DIAGNOSIS — Z794 Long term (current) use of insulin: Secondary | ICD-10-CM | POA: Insufficient documentation

## 2016-08-30 LAB — MAGNESIUM: Magnesium: 1.9 mg/dL (ref 1.5–2.5)

## 2016-08-30 LAB — CBC WITH DIFFERENTIAL/PLATELET
BASOS ABS: 0 10*3/uL (ref 0.0–0.1)
Basophils Relative: 0 % (ref 0.0–3.0)
Eosinophils Absolute: 0 10*3/uL (ref 0.0–0.7)
Eosinophils Relative: 0 % (ref 0.0–5.0)
HEMATOCRIT: 38.3 % — AB (ref 39.0–52.0)
HEMOGLOBIN: 12.7 g/dL — AB (ref 13.0–17.0)
Lymphs Abs: 0.6 10*3/uL — ABNORMAL LOW (ref 0.7–4.0)
MCHC: 33.3 g/dL (ref 30.0–36.0)
MCV: 91.1 fl (ref 78.0–100.0)
MONOS PCT: 5.7 % (ref 3.0–12.0)
Monocytes Absolute: 0.6 10*3/uL (ref 0.1–1.0)
NEUTROS ABS: 9.8 10*3/uL — AB (ref 1.4–7.7)
Neutrophils Relative %: 88.7 % — ABNORMAL HIGH (ref 43.0–77.0)
Platelets: 346 10*3/uL (ref 150.0–400.0)
RBC: 4.2 Mil/uL — AB (ref 4.22–5.81)
RDW: 12.8 % (ref 11.5–15.5)
WBC: 11.1 10*3/uL — AB (ref 4.0–10.5)

## 2016-08-30 LAB — BASIC METABOLIC PANEL
BUN: 13 mg/dL (ref 6–23)
CALCIUM: 9.1 mg/dL (ref 8.4–10.5)
CO2: 23 meq/L (ref 19–32)
Chloride: 94 mEq/L — ABNORMAL LOW (ref 96–112)
Creatinine, Ser: 0.96 mg/dL (ref 0.40–1.50)
GFR: 99.44 mL/min (ref >60.00)
Glucose, Bld: 320 mg/dL — ABNORMAL HIGH (ref 70–99)
Potassium: 3.9 mEq/L (ref 3.5–5.1)
SODIUM: 130 meq/L — AB (ref 135–145)

## 2016-08-30 LAB — PHOSPHORUS: Phosphorus: 4.2 mg/dL (ref 2.3–4.6)

## 2016-08-30 MED ORDER — SODIUM CHLORIDE 0.9 % IV BOLUS (SEPSIS)
1000.0000 mL | Freq: Once | INTRAVENOUS | Status: AC
  Administered 2016-08-30: 1000 mL via INTRAVENOUS

## 2016-08-30 NOTE — Progress Notes (Signed)
Pre visit review using our clinic review tool, if applicable. No additional management support is needed unless otherwise documented below in the visit note. 

## 2016-08-30 NOTE — Patient Instructions (Signed)
Urinary Tract Infection, Adult A urinary tract infection (UTI) is an infection of any part of the urinary tract, which includes the kidneys, ureters, bladder, and urethra. These organs make, store, and get rid of urine in the body. UTI can be a bladder infection (cystitis) or kidney infection (pyelonephritis). What are the causes? This infection may be caused by fungi, viruses, or bacteria. Bacteria are the most common cause of UTIs. This condition can also be caused by repeated incomplete emptying of the bladder during urination. What increases the risk? This condition is more likely to develop if:  You ignore your need to urinate or hold urine for long periods of time.  You do not empty your bladder completely during urination.  You wipe back to front after urinating or having a bowel movement, if you are male.  You are uncircumcised, if you are male.  You are constipated.  You have a urinary catheter that stays in place (indwelling).  You have a weak defense (immune) system.  You have a medical condition that affects your bowels, kidneys, or bladder.  You have diabetes.  You take antibiotic medicines frequently or for long periods of time, and the antibiotics no longer work well against certain types of infections (antibiotic resistance).  You take medicines that irritate your urinary tract.  You are exposed to chemicals that irritate your urinary tract.  You are male. What are the signs or symptoms? Symptoms of this condition include:  Fever.  Frequent urination or passing small amounts of urine frequently.  Needing to urinate urgently.  Pain or burning with urination.  Urine that smells bad or unusual.  Cloudy urine.  Pain in the lower abdomen or back.  Trouble urinating.  Blood in the urine.  Vomiting or being less hungry than normal.  Diarrhea or abdominal pain.  Vaginal discharge, if you are male. How is this diagnosed? This condition is  diagnosed with a medical history and physical exam. You will also need to provide a urine sample to test your urine. Other tests may be done, including:  Blood tests.  Sexually transmitted disease (STD) testing. If you have had more than one UTI, a cystoscopy or imaging studies may be done to determine the cause of the infections. How is this treated? Treatment for this condition often includes a combination of two or more of the following:  Antibiotic medicine.  Other medicines to treat less common causes of UTI.  Over-the-counter medicines to treat pain.  Drinking enough water to stay hydrated. Follow these instructions at home:  Take over-the-counter and prescription medicines only as told by your health care provider.  If you were prescribed an antibiotic, take it as told by your health care provider. Do not stop taking the antibiotic even if you start to feel better.  Avoid alcohol, caffeine, tea, and carbonated beverages. They can irritate your bladder.  Drink enough fluid to keep your urine clear or pale yellow.  Keep all follow-up visits as told by your health care provider. This is important.  Make sure to:  Empty your bladder often and completely. Do not hold urine for long periods of time.  Empty your bladder before and after sex.  Wipe from front to back after a bowel movement if you are male. Use each tissue one time when you wipe. Contact a health care provider if:  You have back pain.  You have a fever.  You feel nauseous or vomit.  Your symptoms do not get better after 3   days.  Your symptoms go away and then return. Get help right away if:  You have severe back pain or lower abdominal pain.  You are vomiting and cannot keep down any medicines or water. This information is not intended to replace advice given to you by your health care provider. Make sure you discuss any questions you have with your health care provider. Document Released:  06/30/2005 Document Revised: 03/03/2016 Document Reviewed: 08/11/2015 Elsevier Interactive Patient Education  2017 Elsevier Inc.  

## 2016-08-30 NOTE — ED Provider Notes (Signed)
[name] DEPT Provider Note    By signing my name below, I, Charles Parker, attest that this documentation has been prepared under the direction and in the presence of [employer], PA-C. Electronically Signed: Bea Parker, ED Scribe. 08/30/16. 11:16 PM.   History   Chief Complaint Chief Complaint  Patient presents with  . Flank Pain    The history is provided by the patient and medical records. No language interpreter was used.    HPI Comments:  Charles Parker is a 70 y.o. male with PMHx of T2DM, DVT, renal and hepatic cyst, HTN and hypercholesterolemia who presents to the Emergency Department complaining of left sided flank pain that radiates to his left lower back that has been ongoing for the past week. He reports subjective fever, nausea, vomiting and diarrhea. Pt was sent here by his PCP, Dr. Scarlette Calico with [facility] . He was recently admitted about two weeks ago for a right kidney infection. He has not taken anything for pain. He denies modifying factors. He denies cough or hemoptysis.   Past Medical History:  Diagnosis Date  . Clotting disorder (HCC)    DVT  . Diabetes mellitus    type II  . Diverticulosis   . DVT (deep venous thrombosis) ([place])   . Glaucoma    no eye drops   . Hepatic cyst   . Hypercholesterolemia   . Hypertension   . Myocardial infarction    very mild long ago   . Renal cyst   . Sleep apnea   . Tubulovillous adenoma     Patient Active Problem List   Diagnosis Date Noted  . Hypocalcemia 08/30/2016  . E coli bacteremia 08/19/2016  . Acute pyelonephritis 08/17/2016  . Prostate mass 08/17/2016  . Tubulovillous adenoma 02/04/2016  . Rotator cuff tendinitis 12/12/2015  . Diabetes mellitus with ophthalmic complication ([place]) 04/54/0981  . Tremor 11/11/2015  . Overgrown toenails 11/11/2015  . Primary osteoarthritis of both knees 07/14/2015  . DOE (dyspnea on exertion) 06/18/2015  . DVT, lower extremity, proximal  ([place]) 07/10/2014  . Routine general medical examination at a health care facility 06/21/2013  . Hyperlipidemia with target LDL less than 70 06/20/2013  . Benign prostatic hyperplasia (BPH) with straining on urination 06/20/2013  . Brain atrophy 06/20/2013  . OSA (obstructive sleep apnea) 06/20/2013  . Depression with anxiety 06/20/2013  . 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 12/27/2011  . Hypertension     Past Surgical History:  Procedure Laterality Date  . BLADDER NECK RECONSTRUCTION  02/03/2012   Procedure: BLADDER NECK REPAIR;  Surgeon: Adin Hector, MD;  Location: [facility] ORS;  Service: General;;  . COLONOSCOPY  02/2013   polyp removal(mult.)  . KNEE SURGERY  2004   left  . POLYPECTOMY    . PROCTOSCOPY  02/03/2012   Procedure: PROCTOSCOPY;  Surgeon: Adin Hector, MD;  Location: [facility] ORS;  Service: General;;  . STOMACH SURGERY  02/2012       Home Medications    Prior to Admission medications   Medication Sig Start Date End Date Taking? Authorizing Provider  Alcohol Swabs (B-D SINGLE USE SWABS REGULAR) PADS Use as directed Dx E11.9 07/16/15  Yes Janith Lima, MD  Blood Glucose Monitoring Suppl (TRUE METRIX METER) W/DEVICE KIT Use as directed  Dx E11.9 07/16/15  Yes Janith Lima, MD  cephALEXin (KEFLEX) 500 MG capsule Take 1 capsule (500 mg total) by mouth  3 (three) times daily. Patient taking differently: Take 500 mg by mouth 3 (three) times daily. Started 11/17 for 10 days 08/20/16 08/31/16 Yes Patrecia Pour, MD  chlorproMAZINE (THORAZINE) 10 MG tablet Take 1 tablet (10 mg total) by mouth 3 (three) times daily as needed for hiccoughs. 08/20/16  Yes Patrecia Pour, MD  glucose blood (TRUE METRIX BLOOD GLUCOSE TEST) test strip 1 each by Other route 2 (two) times daily. Use to check blood sugars twice a day Dx E11.9 08/20/16  Yes Patrecia Pour, MD  Insulin Glargine (LANTUS SOLOSTAR) 100 UNIT/ML Solostar Pen  Inject 40 units every night at bedtime 08/20/16  Yes Patrecia Pour, MD  Insulin Pen Needle 31G X 5 MM MISC Administer lantus once nightly 08/20/16  Yes Patrecia Pour, MD  lisinopril (PRINIVIL,ZESTRIL) 5 MG tablet Take 1 tablet (5 mg total) by mouth daily. 08/21/16  Yes Patrecia Pour, MD  Multiple Vitamin (MULTIVITAMIN WITH MINERALS) TABS tablet Take 1 tablet by mouth daily.   Yes Historical Provider, MD  rivaroxaban (XARELTO) 20 MG TABS tablet Take 1 tablet (20 mg total) by mouth daily with supper. 07/30/16  Yes Janith Lima, MD  simvastatin (ZOCOR) 40 MG tablet Take 1 tablet (40 mg total) by mouth every evening. 07/14/15  Yes Janith Lima, MD  TRUEPLUS LANCETS 33G MISC Use to help check blood sugars twice a day Dx E11.9 07/16/15  Yes Janith Lima, MD    Family History Family History  Problem Relation Age of Onset  . Hypertension Mother   . Diabetes Mother   . Cancer Mother     ? location  . Colon cancer Father 28  . Colon polyps Neg Hx     Social History Social History  Substance Use Topics  . Smoking status: Never Smoker  . Smokeless tobacco: Never Used  . Alcohol use 0.0 oz/week     Comment: once every two months     Allergies   Metformin and related and Codeine   Review of Systems Review of Systems A complete 10 system review of systems was obtained and all systems are negative except as noted in the HPI and PMH.    Physical Exam Updated Vital Signs BP 167/93 (BP Location: Left Arm)   Pulse 116   Temp 98.2 F (36.8 C) (Oral)   Resp 18   SpO2 94%   Physical Exam  Constitutional: He is oriented to person, place, and time. He appears well-developed and well-nourished. No distress.  HENT:  Head: Normocephalic and atraumatic.  Mouth/Throat: Oropharynx is clear and moist.  Eyes: Pupils are equal, round, and reactive to light.  Neck: Normal range of motion. Neck supple.  Cardiovascular: Normal rate, regular rhythm and normal heart sounds.  Exam reveals no  gallop and no friction rub.   No murmur heard. Pulmonary/Chest: Effort normal and breath sounds normal. No respiratory distress. He has no wheezes.  Abdominal: Soft. Bowel sounds are normal. He exhibits no distension. There is tenderness.  Left sided abdominal pain. Left flank pain.  Neurological: He is alert and oriented to person, place, and time. He exhibits normal muscle tone. Coordination normal.  Skin: Skin is warm and dry. No rash noted. No erythema.  Psychiatric: He has a normal mood and affect. His behavior is normal.  Nursing note and vitals reviewed.    ED Treatments / Results  DIAGNOSTIC STUDIES: Oxygen Saturation is 94% on RA, adequate by my interpretation.   COORDINATION OF CARE: 11:17  PM- Will give IV fluids and lab work. Pt verbalizes understanding and agrees to plan.  Medications - No data to display  Labs (all labs ordered are listed, but only abnormal results are displayed) Labs Reviewed  URINALYSIS, ROUTINE W REFLEX MICROSCOPIC (NOT AT [facility])    EKG  EKG Interpretation None       Radiology No results found.  Procedures Procedures (including critical care time)  Medications Ordered in ED Medications - No data to display   Initial Impression / Assessment and Plan / ED Course  I have reviewed the triage vital signs and the nursing notes.  Pertinent labs & imaging results that were available during my care of the patient were reviewed by me and considered in my medical decision making (see chart for details).  Clinical Course     Patient will be treated for urinary tract infection is not have any need for admission this time.  He has been stable.  He has been given IV fluids.  His pain is well-controlled at this time, we will culture his urine and start him on Levaquin  Final Clinical Impressions(s) / ED Diagnoses   Final diagnoses:  None    New Prescriptions New Prescriptions   No medications on file     Dalia Heading,  PA-C 08/31/16 0254    Everlene Balls, MD 08/31/16 [phone]

## 2016-08-30 NOTE — Progress Notes (Signed)
Subjective:  Patient ID: Charles Parker, male    DOB: [birth year]  Age: 70 y.o. MRN: [id number]  CC: Flank Pain   HPI Charles Parker presents for follow-up after a recent admission for Escherichia coli bacteremia that was felt to be related to a urinary tract infection but there was also some concern for pneumonia on his CT scan. Since being discharged he complains of persistent fever, chills, and decreased appetite. He has had no nausea or vomiting. He reports that he has been compliant with the Keflex that was prescribed. When he was admitted initially he had right flank pain but today he tells me over the last few days he has developed left flank pain that is sharp and stabbing. He denies cough or hemoptysis. He denies abdominal pain or diarrhea. He tells me his blood sugars have been in the 120-130 range.  Outpatient Medications Prior to Visit  Medication Sig Dispense Refill  . Alcohol Swabs (B-D SINGLE USE SWABS REGULAR) PADS Use as directed Dx E11.9 180 each 3  . Blood Glucose Monitoring Suppl (TRUE METRIX METER) W/DEVICE KIT Use as directed  Dx E11.9 1 kit 0  . cephALEXin (KEFLEX) 500 MG capsule Take 1 capsule (500 mg total) by mouth 3 (three) times daily. 33 capsule 0  . chlorproMAZINE (THORAZINE) 10 MG tablet Take 1 tablet (10 mg total) by mouth 3 (three) times daily as needed for hiccoughs. 30 tablet 0  . glucose blood (TRUE METRIX BLOOD GLUCOSE TEST) test strip 1 each by Other route 2 (two) times daily. Use to check blood sugars twice a day Dx E11.9 180 each 0  . Insulin Glargine (LANTUS SOLOSTAR) 100 UNIT/ML Solostar Pen Inject 40 units every night at bedtime 15 mL 0  . Insulin Pen Needle 31G X 5 MM MISC Administer lantus once nightly 90 each 0  . lisinopril (PRINIVIL,ZESTRIL) 5 MG tablet Take 1 tablet (5 mg total) by mouth daily. 30 tablet 0  . MULTIPLE VITAMIN PO Take 1 tablet by mouth daily.     . rivaroxaban (XARELTO) 20 MG TABS tablet Take 1 tablet (20 mg total) by mouth  daily with supper. 90 tablet 0  . simvastatin (ZOCOR) 40 MG tablet Take 1 tablet (40 mg total) by mouth every evening. 90 tablet 3  . TRUEPLUS LANCETS 33G MISC Use to help check blood sugars twice a day Dx E11.9 180 each 3   Facility-Administered Medications Prior to Visit  Medication Dose Route Frequency Provider Last Rate Last Dose  . 0.9 %  sodium chloride infusion  500 mL Intravenous Continuous Manus Gunning, MD        ROS Review of Systems  Constitutional: Positive for appetite change, chills, fatigue and fever. Negative for activity change, diaphoresis and unexpected weight change.  HENT: Negative.   Eyes: Negative for visual disturbance.  Respiratory: Negative for cough, choking, shortness of breath and stridor.   Cardiovascular: Negative for chest pain, palpitations and leg swelling.  Gastrointestinal: Negative for abdominal pain, constipation, diarrhea, nausea and vomiting.  Endocrine: Negative.   Genitourinary: Positive for dysuria and flank pain. Negative for difficulty urinating, hematuria and urgency.  Musculoskeletal: Negative for back pain.  Skin: Negative.  Negative for color change, pallor and rash.  Allergic/Immunologic: Negative.   Neurological: Negative.  Negative for dizziness, weakness and numbness.  Hematological: Negative.  Negative for adenopathy. Does not bruise/bleed easily.  Psychiatric/Behavioral: Negative.     Objective:  BP (!) 160/90 (BP Location: Left Arm, Patient Position: Sitting,  Cuff Size: Normal)   Pulse (!) 110   Temp 98.3 F (36.8 C) (Oral)   Resp (!) 24   Ht 6' (1.829 m)   Wt 183 lb (83 kg)   BMI 24.82 kg/m   BP Readings from Last 3 Encounters:  08/30/16 (!) 160/90  08/20/16 112/63  08/17/16 (!) 140/100    Wt Readings from Last 3 Encounters:  08/30/16 183 lb (83 kg)  08/17/16 185 lb (83.9 kg)  08/17/16 185 lb 3 oz (84 kg)    Physical Exam  Constitutional: He is oriented to person, place, and time. He appears  well-developed and well-nourished.  Non-toxic appearance. He has a sickly appearance. He appears ill. He appears distressed.  HENT:  Mouth/Throat: Oropharynx is clear and moist. No oropharyngeal exudate.  Eyes: Conjunctivae are normal. Right eye exhibits no discharge. Left eye exhibits no discharge. No scleral icterus.  Neck: Normal range of motion. Neck supple. No JVD present. No tracheal deviation present. No thyromegaly present.  Cardiovascular: Normal rate, regular rhythm, normal heart sounds and intact distal pulses.  Exam reveals no gallop and no friction rub.   No murmur heard. Pulmonary/Chest: Effort normal and breath sounds normal. No stridor. No respiratory distress. He has no wheezes. He has no rales. He exhibits no tenderness.  Abdominal: Soft. Bowel sounds are normal. He exhibits no distension and no mass. There is no hepatosplenomegaly. There is tenderness in the left upper quadrant. There is CVA tenderness. There is no rebound and no guarding.  Musculoskeletal: Normal range of motion. He exhibits no edema, tenderness or deformity.  Lymphadenopathy:    He has no cervical adenopathy.  Neurological: He is oriented to person, place, and time.  Skin: Skin is warm and dry. No rash noted. He is not diaphoretic. No erythema. No pallor.    Lab Results  Component Value Date   WBC 11.1 (H) 08/30/2016   HGB 12.7 (L) 08/30/2016   HCT 38.3 (L) 08/30/2016   PLT 346.0 08/30/2016   GLUCOSE 320 (H) 08/30/2016   CHOL 195 11/11/2015   TRIG 210.0 (H) 11/11/2015   HDL 43.30 11/11/2015   LDLDIRECT 118.0 11/11/2015   LDLCALC 151 (H) 06/18/2015   ALT 26 08/18/2016   AST 30 08/18/2016   NA 130 (L) 08/30/2016   K 3.9 08/30/2016   CL 94 (L) 08/30/2016   CREATININE 0.96 08/30/2016   BUN 13 08/30/2016   CO2 23 08/30/2016   TSH 1.83 11/01/2014   PSA 1.49 08/17/2016   INR 2.1 (H) 05/26/2009   HGBA1C 12.6 08/17/2016   MICROALBUR <0.7 11/11/2015    Dg Chest 2 View  Result Date:  08/17/2016 CLINICAL DATA:  Acute onset of sepsis.  Initial encounter. EXAM: CHEST  2 VIEW COMPARISON:  Chest radiograph performed 01/27/2012 FINDINGS: The lungs are well-aerated. There is mild elevation of the left hemidiaphragm, with minimal left basilar atelectasis. There is no evidence of pleural effusion or pneumothorax. The heart is normal in size; the mediastinal contour is within normal limits. No acute osseous abnormalities are seen. IMPRESSION: Mild elevation of the left hemidiaphragm, with minimal left basilar atelectasis. Lungs otherwise grossly clear. Electronically Signed   By: Garald Balding M.D.   On: 08/17/2016 19:54   Ct Renal Stone Study  Result Date: 08/17/2016 CLINICAL DATA:  Right flank and pelvic region pain EXAM: CT ABDOMEN AND PELVIS WITHOUT CONTRAST TECHNIQUE: Multidetector CT imaging of the abdomen and pelvis was performed following the standard protocol without oral or intravenous contrast material administration. COMPARISON:  December 26, 2013 FINDINGS: Lower chest: There is mild patchy airspace consolidation in the lateral segment of the left lower lobe. There patchy areas of atelectasis in the lung bases bilaterally as well. There are foci of coronary artery calcification. Hepatobiliary: There is fatty infiltration near the fissure for the ligamentum teres. There is a 4 mm cyst in the posterior aspect of the medial segment left lobe of the liver. No other focal liver lesions are identified. There is cholelithiasis. Gallbladder wall is not thickened. There is no biliary duct dilatation. Pancreas: There is no pancreatic mass or inflammatory focus. Spleen: No splenic lesions are evident. Adrenals/Urinary Tract: Adrenals appear normal bilaterally. There is a cyst in the upper pole of the left kidney measuring 1.4 x 1.4 cm. A somewhat larger cyst in the mid left kidney measures 3.4 x 3.2 cm. There is a cyst in the posterior mid kidney measuring 1 x 1 cm. On the left, there is a cyst  arising eccentrically from the upper pole region measuring 5.8 x 3.9 cm, slightly smaller compared to prior study. A cyst posteriorly in the upper to midportion region on the right measures 2.3 x 2.3 cm. There are 2 adjacent cysts in the lower pole region, each measuring slightly greater than 1 cm. There is no evident hydronephrosis on either side. No renal or ureteral calculus is evident on either side. The urinary bladder wall is mildly thickened and slightly irregular in contour. Stomach/Bowel: There is no appreciable bowel wall or mesenteric thickening. No evident bowel obstruction. No free air or portal venous air. Vascular/Lymphatic: There is atherosclerotic calcification in the aorta and common iliac arteries. Aorta is tortuous but shows no aneurysm. Major mesenteric vessels appear patent on this noncontrast enhanced study. There is no evident adenopathy in the abdomen or pelvis. Reproductive: The prostate is diffusely prominent and cannot be separated from the inferior urinary bladder. There are multiple calculi in the lower pole portion of the prostate. Seminal vesicles appear unremarkable in size and contour. Apart from the prostate, there is no pelvic mass or pelvic fluid collection. Other: Appendix appears normal. There is no evident ascites or abscess in the abdomen or pelvis. Musculoskeletal: There is degenerative change in the lumbar spine. There are a few small foci of sclerosis in the right iliac crest, stable from prior study and felt to represent small bone islands. No lytic or destructive bone lesions identified. No intramuscular or abdominal wall lesion evident. IMPRESSION: No renal or ureteral calculi. No hydronephrosis. Multiple renal cysts. The largest cyst on the right is smaller compared to prior study. Urinary bladder wall is mildly thickened and slightly irregular. This finding is concerning for cystitis. The prostate is enlarged with its superior aspect contiguous with the inferior  urinary bladder. A well-defined fat plane between the urinary bladder and prostate not present. Question possible prostate carcinoma invading the inferior bladder. This finding may warrant direct visualization to further assess. Correlation with PSA advised as well. Calcifications noted in lower prostate consistent with prostatic calculi. No bowel obstruction.  No abscess.  Appendix appears normal. Cholelithiasis. Focus of infiltrate in the lateral segment left lower lobe, felt to be representative of pneumonia. There is aortic and iliac artery atherosclerosis. There are foci of coronary artery calcification evident. Electronically Signed   By: Lowella Grip III M.D.   On: 08/17/2016 19:51    Assessment & Plan:   Charles Parker was seen today for flank pain.  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 His lab work shows declining sodium and chloride levels and an elevated white blood cell count with a shift to the left. I am concerned he is septic again and have asked him to report to the emergency room for reevaluation and for reconsideration of IV antibiotics. -     Basic metabolic panel; Future -     CBC with Differential/Platelet; Future  Acute pyelonephritis- as above -     Basic metabolic panel; Future -     CBC with Differential/Platelet; Future  Essential hypertension -     Basic metabolic panel; Future -     CBC with Differential/Platelet;  Future  Hypocalcemia -     Magnesium; Future -     Phosphorus; Future -     PTH, intact and calcium; Future   I am having Charles Parker maintain his simvastatin, B-D SINGLE USE SWABS REGULAR, TRUE METRIX METER, TRUEPLUS LANCETS 33G, MULTIPLE VITAMIN PO, rivaroxaban, glucose blood, lisinopril, chlorproMAZINE, cephALEXin, Insulin Pen Needle, and Insulin Glargine. We will continue to administer sodium chloride.  No orders of the defined types were placed in this encounter.    Follow-up: Return in about 1 day (around 08/31/2016).  Scarlette Calico, MD

## 2016-08-30 NOTE — ED Triage Notes (Signed)
Pt was sent in for possible continuing kidney infection/bladder infection. States that he was sent back in PCP. L flank pain. Alert and oriented.

## 2016-08-30 NOTE — ED Notes (Signed)
Pt being sent by PCP.  C/o abdominal pain and dark urine.  Hx of DM Type 2.  Pt was recently admitted d/t uncontrolled DM.

## 2016-08-31 ENCOUNTER — Encounter (HOSPITAL_COMMUNITY): Payer: Self-pay

## 2016-08-31 ENCOUNTER — Emergency Department (HOSPITAL_COMMUNITY): Payer: Commercial Managed Care - HMO

## 2016-08-31 DIAGNOSIS — N390 Urinary tract infection, site not specified: Secondary | ICD-10-CM | POA: Diagnosis not present

## 2016-08-31 DIAGNOSIS — R109 Unspecified abdominal pain: Secondary | ICD-10-CM | POA: Diagnosis not present

## 2016-08-31 LAB — URINALYSIS, ROUTINE W REFLEX MICROSCOPIC
BILIRUBIN URINE: NEGATIVE
Glucose, UA: >1000 mg/dL — AB
KETONES UR: NEGATIVE mg/dL
NITRITE: NEGATIVE
PH: 5 (ref 5.0–8.0)
Protein, ur: 30 mg/dL — AB
Specific Gravity, Urine: 1.027 (ref 1.005–1.030)

## 2016-08-31 LAB — URINE MICROSCOPIC-ADD ON

## 2016-08-31 LAB — PTH, INTACT AND CALCIUM
Calcium: 8.8 mg/dL (ref 8.6–10.3)
PTH: 28 pg/mL (ref 14–64)

## 2016-08-31 MED ORDER — LEVOFLOXACIN 750 MG PO TABS: 750.0000 mg | ORAL_TABLET | Freq: Every day | ORAL | 0 refills | Status: DC

## 2016-08-31 MED ORDER — LEVOFLOXACIN 500 MG PO TABS: 500.0000 mg | ORAL_TABLET | Freq: Every day | ORAL | 0 refills | Status: DC

## 2016-08-31 MED ORDER — HYDROCODONE-ACETAMINOPHEN 5-325 MG PO TABS: 1.0000 | ORAL_TABLET | Freq: Four times a day (QID) | ORAL | 0 refills | Status: DC | PRN

## 2016-08-31 MED ORDER — LEVOFLOXACIN 750 MG PO TABS
750.0000 mg | ORAL_TABLET | Freq: Once | ORAL | Status: AC
  Administered 2016-08-31: 750 mg via ORAL
  Filled 2016-08-31: qty 1

## 2016-08-31 MED ORDER — IOPAMIDOL (ISOVUE-300) INJECTION 61%
100.0000 mL | Freq: Once | INTRAVENOUS | Status: AC | PRN
  Administered 2016-08-31: 100 mL via INTRAVENOUS

## 2016-08-31 MED ORDER — IOPAMIDOL (ISOVUE-300) INJECTION 61%
INTRAVENOUS | Status: AC
  Filled 2016-08-31: qty 100

## 2016-08-31 MED ORDER — SODIUM CHLORIDE 0.9 % IJ SOLN
INTRAMUSCULAR | Status: AC
  Administered 2016-08-31: 02:00:00
  Filled 2016-08-31: qty 50

## 2016-08-31 NOTE — ED Notes (Signed)
Patient transported to CT 

## 2016-08-31 NOTE — Discharge Instructions (Signed)
Return here as needed.  Follow-up with your primary doctor. °

## 2016-09-01 LAB — URINE CULTURE

## 2016-09-02 ENCOUNTER — Other Ambulatory Visit: Payer: Self-pay

## 2016-09-03 DIAGNOSIS — R3915 Urgency of urination: Secondary | ICD-10-CM | POA: Diagnosis not present

## 2016-09-03 DIAGNOSIS — N401 Enlarged prostate with lower urinary tract symptoms: Secondary | ICD-10-CM | POA: Diagnosis not present

## 2016-09-03 NOTE — Patient Outreach (Signed)
[facility]) Care Management   09/02/2016   Charles Parker 1946/09/14 [id number]  Charles Parker is an 70 y.o. male  Subjective:  I had to go back to the emergency room the other day, my back was killing me. The doctor ordered a more powerful antibiotic. I have been checking my blood sugars twice a day since I got out the hospital Objective:   ROS   Well nourished, african [place] male wearing glasses.    Physical Exam   ROS  Encounter Medications:   Outpatient Encounter Prescriptions as of 09/02/2016  Medication Sig  . Alcohol Swabs (B-D SINGLE USE SWABS REGULAR) PADS Use as directed Dx E11.9  . Blood Glucose Monitoring Suppl (TRUE METRIX METER) W/DEVICE KIT Use as directed  Dx E11.9  . glucose blood (TRUE METRIX BLOOD GLUCOSE TEST) test strip 1 each by Other route 2 (two) times daily. Use to check blood sugars twice a day Dx E11.9  . HYDROcodone-acetaminophen (NORCO/VICODIN) 5-325 MG tablet Take 1 tablet by mouth every 6 (six) hours as needed for moderate pain.  . Insulin Glargine (LANTUS SOLOSTAR) 100 UNIT/ML Solostar Pen Inject 40 units every night at bedtime  . Insulin Pen Needle 31G X 5 MM MISC Administer lantus once nightly  . levofloxacin (LEVAQUIN) 500 MG tablet Take 1 tablet (500 mg total) by mouth daily.  Marland Kitchen levofloxacin (LEVAQUIN) 750 MG tablet Take 1 tablet (750 mg total) by mouth daily.  Marland Kitchen lisinopril (PRINIVIL,ZESTRIL) 5 MG tablet Take 1 tablet (5 mg total) by mouth daily.  . Multiple Vitamin (MULTIVITAMIN WITH MINERALS) TABS tablet Take 1 tablet by mouth daily.  . rivaroxaban (XARELTO) 20 MG TABS tablet Take 1 tablet (20 mg total) by mouth daily with supper.  . simvastatin (ZOCOR) 40 MG tablet Take 1 tablet (40 mg total) by mouth every evening.  . TRUEPLUS LANCETS 33G MISC Use to help check blood sugars twice a day Dx E11.9  . chlorproMAZINE (THORAZINE) 10 MG tablet Take 1 tablet (10 mg total) by mouth 3 (three) times daily as needed for  hiccoughs. (Patient not taking: Reported on 09/02/2016)   Facility-Administered Encounter Medications as of 09/02/2016  Medication  . 0.9 %  sodium chloride infusion    Functional Status:   In your present state of health, do you have any difficulty performing the following activities: 08/23/2016 08/17/2016  Hearing? N N  Vision? Y N  Difficulty concentrating or making decisions? Y N  Walking or climbing stairs? Y Y  Dressing or bathing? N N  Doing errands, shopping? Y N  Preparing Food and eating ? N -  Using the Toilet? N -  In the past six months, have you accidently leaked urine? N -  Do you have problems with loss of bowel control? N -  Managing your Medications? Y -  Managing your Finances? N -  Housekeeping or managing your Housekeeping? Y -  Some recent data might be hidden    Fall/Depression Screening:    PHQ 2/9 Scores 08/24/2016 01/01/2016 08/18/2015 07/17/2015 07/14/2015 11/02/2014 11/01/2014  PHQ - 2 Score 0 0 0 0 0 0 0    Fall Risk  09/02/2016 08/24/2016 01/01/2016 08/18/2015 08/13/2015  Falls in the past year? Yes No No No No  Number falls in past yr: 1 - - - -  Risk for fall due to : Impaired balance/gait;Impaired mobility;Impaired vision;Medication side effect Impaired vision;Medication side effect - - -  Follow up Follow up appointment - - - -  [facility] CM Care Plan Problem One   Flowsheet Row Most Recent Value  Care Plan Problem One  Patient recently discharged from acute care  Role Documenting the Problem One  Care Management [place] for Problem One  Active  [facility] Long Term Goal (31-90 days)  In the next 31 days, patient will hae not acute care admissions for hyperglycemia  [facility] Long Term Goal Start Date  08/23/16  Interventions for Problem One Long Term Goal  initial home visit for assessment of community care coordiantion. completed on 11/20  [facility] CM Short Term Goal #1 (0-30 days)  In the next 21 days, patient will meet iwth [place] care  coordinator for diabetes education  [facility] CM Short Term Goal #1 Start Date  08/24/16  [facility] CM Short Term Goal #1 Met Date  09/02/16  Interventions for Short Term Goal #1  initilal home visit for medication reconcilliation, diabetic education, filled out medicaid application and completed renewal paperwork for  SNAP benefits  [facility] CM Short Term Goal #2 (0-30 days)  In the next 21 days, patient will measure and record his blood glucose level 14 out of 21 times.    [facility] CM Short Term Goal #2 Start Date  08/23/16  Interventions for Short Term Goal #2  glucometer readings reviewed with patient and his wife, patient had  consistent recordings of blood glucose levels since his discharge from the acute care setting.       Assessment:   Patient's two brothers present during this home visit, Charles Parker. Patient consented to his brothers being present during this home visit. Patient has been measuring and recording his glucose readings twice a day. Patient has all his medications and written schedule as to when he should take medications. Patient viewed EMMI video on diabetes, asked questions during viewing of videos. [name] assisted patient with completion of medicaid application, completion of SNAP renewal application.    Plan:   Telephone contact in December for further assessment of community care coordination.

## 2016-09-08 ENCOUNTER — Ambulatory Visit (INDEPENDENT_AMBULATORY_CARE_PROVIDER_SITE_OTHER): Payer: Commercial Managed Care - HMO | Admitting: Internal Medicine

## 2016-09-08 ENCOUNTER — Encounter: Payer: Self-pay | Admitting: Internal Medicine

## 2016-09-08 ENCOUNTER — Other Ambulatory Visit (INDEPENDENT_AMBULATORY_CARE_PROVIDER_SITE_OTHER): Payer: Commercial Managed Care - HMO

## 2016-09-08 DIAGNOSIS — R002 Palpitations: Secondary | ICD-10-CM

## 2016-09-08 DIAGNOSIS — I10 Essential (primary) hypertension: Secondary | ICD-10-CM | POA: Diagnosis not present

## 2016-09-08 DIAGNOSIS — R7881 Bacteremia: Secondary | ICD-10-CM

## 2016-09-08 DIAGNOSIS — B9620 Unspecified Escherichia coli [E. coli] as the cause of diseases classified elsewhere: Secondary | ICD-10-CM

## 2016-09-08 LAB — URINALYSIS, ROUTINE W REFLEX MICROSCOPIC
Bilirubin Urine: NEGATIVE
HGB URINE DIPSTICK: NEGATIVE
Leukocytes, UA: NEGATIVE
Nitrite: NEGATIVE
RBC / HPF: NONE SEEN (ref 0–?)
Specific Gravity, Urine: 1.025 (ref 1.000–1.030)
Urine Glucose: NEGATIVE
Urobilinogen, UA: 0.2 (ref 0.0–1.0)
pH: 6 (ref 5.0–8.0)

## 2016-09-08 LAB — BASIC METABOLIC PANEL
BUN: 11 mg/dL (ref 6–23)
CHLORIDE: 101 meq/L (ref 96–112)
CO2: 31 mEq/L (ref 19–32)
CREATININE: 0.83 mg/dL (ref 0.40–1.50)
Calcium: 9.7 mg/dL (ref 8.4–10.5)
GFR: 117.62 mL/min (ref >60.00)
GLUCOSE: 164 mg/dL — AB (ref 70–99)
Potassium: 3.9 mEq/L (ref 3.5–5.1)
Sodium: 140 mEq/L (ref 135–145)

## 2016-09-08 LAB — CBC WITH DIFFERENTIAL/PLATELET
BASOS PCT: 0.5 % (ref 0.0–3.0)
Basophils Absolute: 0 10*3/uL (ref 0.0–0.1)
Eosinophils Absolute: 0 10*3/uL (ref 0.0–0.7)
Eosinophils Relative: 0.6 % (ref 0.0–5.0)
HEMATOCRIT: 35.6 % — AB (ref 39.0–52.0)
HEMOGLOBIN: 12 g/dL — AB (ref 13.0–17.0)
LYMPHS PCT: 21.5 % (ref 12.0–46.0)
Lymphs Abs: 1.5 10*3/uL (ref 0.7–4.0)
MCHC: 33.7 g/dL (ref 30.0–36.0)
MCV: 89.8 fl (ref 78.0–100.0)
MONOS PCT: 6.5 % (ref 3.0–12.0)
Monocytes Absolute: 0.5 10*3/uL (ref 0.1–1.0)
NEUTROS ABS: 5.1 10*3/uL (ref 1.4–7.7)
Neutrophils Relative %: 70.9 % (ref 43.0–77.0)
PLATELETS: 365 10*3/uL (ref 150.0–400.0)
RBC: 3.97 Mil/uL — ABNORMAL LOW (ref 4.22–5.81)
RDW: 13 % (ref 11.5–15.5)
WBC: 7.2 10*3/uL (ref 4.0–10.5)

## 2016-09-08 NOTE — Progress Notes (Signed)
Subjective:  Patient ID: Charles Parker, male    DOB: [birth year]  Age: 70 y.o. MRN: [id number]  CC: Palpitations and Urinary Tract Infection   HPI Charles Parker presents for follow-up regarding recent episode of UTI with bacteremia )Escherichia coli) that is now being treated with Levaquin.Marland Kitchen He tells me that he is feeling much better. He has had no more fever, chills, dizziness, abdominal pain, flank pain, nausea, vomiting, or loss of appetite. His only complaint today is he feels like he has had a few extra heartbeats over the last week or 2. It cessation last very briefly and is not associated with dizziness, diaphoresis, chest pain, cough, lightheadedness, or near-syncope. The symptoms occur at rest.  Outpatient Medications Prior to Visit  Medication Sig Dispense Refill  . Alcohol Swabs (B-D SINGLE USE SWABS REGULAR) PADS Use as directed Dx E11.9 180 each 3  . Blood Glucose Monitoring Suppl (TRUE METRIX METER) W/DEVICE KIT Use as directed  Dx E11.9 1 kit 0  . glucose blood (TRUE METRIX BLOOD GLUCOSE TEST) test strip 1 each by Other route 2 (two) times daily. Use to check blood sugars twice a day Dx E11.9 180 each 0  . HYDROcodone-acetaminophen (NORCO/VICODIN) 5-325 MG tablet Take 1 tablet by mouth every 6 (six) hours as needed for moderate pain. 10 tablet 0  . Insulin Glargine (LANTUS SOLOSTAR) 100 UNIT/ML Solostar Pen Inject 40 units every night at bedtime 15 mL 0  . Insulin Pen Needle 31G X 5 MM MISC Administer lantus once nightly 90 each 0  . lisinopril (PRINIVIL,ZESTRIL) 5 MG tablet Take 1 tablet (5 mg total) by mouth daily. 30 tablet 0  . rivaroxaban (XARELTO) 20 MG TABS tablet Take 1 tablet (20 mg total) by mouth daily with supper. 90 tablet 0  . simvastatin (ZOCOR) 40 MG tablet Take 1 tablet (40 mg total) by mouth every evening. 90 tablet 3  . TRUEPLUS LANCETS 33G MISC Use to help check blood sugars twice a day Dx E11.9 180 each 3  . chlorproMAZINE (THORAZINE) 10 MG tablet  Take 1 tablet (10 mg total) by mouth 3 (three) times daily as needed for hiccoughs. 30 tablet 0  . levofloxacin (LEVAQUIN) 750 MG tablet Take 1 tablet (750 mg total) by mouth daily. 4 tablet 0  . Multiple Vitamin (MULTIVITAMIN WITH MINERALS) TABS tablet Take 1 tablet by mouth daily.    Marland Kitchen levofloxacin (LEVAQUIN) 500 MG tablet Take 1 tablet (500 mg total) by mouth daily. (Patient not taking: Reported on 09/08/2016) 10 tablet 0  . 0.9 %  sodium chloride infusion      No facility-administered medications prior to visit.     ROS Review of Systems  Constitutional: Negative for activity change, appetite change, chills, fatigue, fever and unexpected weight change.  HENT: Negative.   Respiratory: Negative for cough, shortness of breath and wheezing.   Cardiovascular: Positive for palpitations. Negative for chest pain and leg swelling.  Gastrointestinal: Negative for abdominal pain, constipation, diarrhea, nausea and vomiting.  Endocrine: Negative for cold intolerance, heat intolerance, polydipsia, polyphagia and polyuria.  Genitourinary: Negative.  Negative for decreased urine volume, difficulty urinating, dysuria, flank pain, frequency, hematuria and urgency.  Musculoskeletal: Negative for back pain and myalgias.  Skin: Negative.  Negative for color change, pallor and rash.  Allergic/Immunologic: Negative.   Neurological: Negative.  Negative for dizziness, syncope, weakness, light-headedness, numbness and headaches.  Hematological: Negative.  Negative for adenopathy. Does not bruise/bleed easily.  Psychiatric/Behavioral: Negative.     Objective:  BP 140/78 (BP Location: Left Arm, Patient Position: Sitting, Cuff Size: Normal)   Pulse 75   Temp 97.4 F (36.3 C) (Oral)   Resp 16   Ht 6' (1.829 m)   Wt 178 lb 4 oz (80.9 kg)   SpO2 94%   BMI 24.18 kg/m   BP Readings from Last 3 Encounters:  09/08/16 140/78  08/31/16 135/87  08/30/16 (!) 160/90    Wt Readings from Last 3 Encounters:    09/08/16 178 lb 4 oz (80.9 kg)  08/30/16 183 lb (83 kg)  08/17/16 185 lb (83.9 kg)    Physical Exam  Constitutional: He is oriented to person, place, and time. No distress.  HENT:  Mouth/Throat: Oropharynx is clear and moist. No oropharyngeal exudate.  Eyes: Conjunctivae are normal. Right eye exhibits no discharge. Left eye exhibits no discharge. No scleral icterus.  Neck: Normal range of motion. Neck supple. No JVD present. No tracheal deviation present. No thyromegaly present.  Cardiovascular: Normal rate, regular rhythm, normal heart sounds and intact distal pulses.  Exam reveals no gallop and no friction rub.   No murmur heard. EKG -  Sinus  Rhythm  WITHIN NORMAL LIMITS   Pulmonary/Chest: Effort normal and breath sounds normal. No stridor. No respiratory distress. He has no wheezes. He has no rales. He exhibits no tenderness.  Abdominal: Soft. Bowel sounds are normal. He exhibits no distension and no mass. There is no tenderness. There is no rebound and no guarding.  Musculoskeletal: Normal range of motion. He exhibits no edema, tenderness or deformity.  Lymphadenopathy:    He has no cervical adenopathy.  Neurological: He is oriented to person, place, and time.  Skin: Skin is warm and dry. No rash noted. He is not diaphoretic. No erythema. No pallor.  Psychiatric: He has a normal mood and affect. His behavior is normal. Judgment and thought content normal.  Vitals reviewed.   Lab Results  Component Value Date   WBC 7.2 09/08/2016   HGB 12.0 (L) 09/08/2016   HCT 35.6 (L) 09/08/2016   PLT 365.0 09/08/2016   GLUCOSE 164 (H) 09/08/2016   CHOL 195 11/11/2015   TRIG 210.0 (H) 11/11/2015   HDL 43.30 11/11/2015   LDLDIRECT 118.0 11/11/2015   LDLCALC 151 (H) 06/18/2015   ALT 26 08/18/2016   AST 30 08/18/2016   NA 140 09/08/2016   K 3.9 09/08/2016   CL 101 09/08/2016   CREATININE 0.83 09/08/2016   BUN 11 09/08/2016   CO2 31 09/08/2016   TSH 0.67 09/08/2016   PSA 1.49  08/17/2016   INR 2.1 (H) 05/26/2009   HGBA1C 12.6 08/17/2016   MICROALBUR <0.7 11/11/2015    Ct Abdomen Pelvis W Contrast  Result Date: 08/31/2016 CLINICAL DATA:  Left flank pain radiating to the low back for 1 week history of kidney infection EXAM: CT ABDOMEN AND PELVIS WITH CONTRAST TECHNIQUE: Multidetector CT imaging of the abdomen and pelvis was performed using the standard protocol following bolus administration of intravenous contrast. CONTRAST:  100 mL Isovue-300 intravenous COMPARISON:  08/17/2016 FINDINGS: Lower chest: No large pleural effusion. Streaky atelectasis or pneumonia within the posterior lower lobes, increased compared to prior.The heart does not appear enlarged. No significant pericardial effusion. Hepatobiliary: No intra hepatic biliary dilatation. A small hypodensity within the lateral segment of the left hepatic lobe is unchanged. No biliary dilatation. Mild increased density in the gallbladder consistent with small stones. No wall thickening. Pancreas: Unremarkable. No pancreatic ductal dilatation or surrounding inflammatory changes. Spleen: Normal  in size without focal abnormality. Adrenals/Urinary Tract: Adrenal glands are within normal limits. Again visualized are multiple hypodense lesions within the bilateral kidneys, many of which are felt consistent with cysts. The largest on the right is seen within the upper pole and measures 5.3 cm. The largest on the left is seen within the midpole and measures 3.6 cm. Other sub cm lesions in both kidneys too small to further characterize. The bladder is thick walled and slightly irregular in appearance. Stomach/Bowel: The stomach is nonenlarged. No evidence for small bowel obstruction. The appendix is visualized and is normal. Vascular/Lymphatic: Aortic atherosclerosis. No enlarged abdominal or pelvic lymph nodes. Reproductive: Irregular enlarged prostate gland containing multiple calcifications. Other: No free air or free fluid.  Musculoskeletal: Degenerative changes of the spine. No acute osseous abnormality. IMPRESSION: 1. No evidence for perinephric fluid collection. Homogeneously enhancement of the kidneys. Bilateral renal cysts. Additional sub cm hypodense lesions in the kidneys too small to further characterize. No hydronephrosis. 2. Irregular thick-walled appearance of the urinary bladder could relate to a cystitis. 3. Increased streaky atelectasis or pneumonia within the posterior left greater than right lower lobes. 4. Enlarged irregular prostate gland with calcifications, recommend correlation with PSA. Electronically Signed   By: [name] Foil M.D.   On: 08/31/2016 02:59    Assessment & Plan:   Charles Parker was seen today for palpitations and urinary tract infection.  Diagnoses and all orders for this visit:  Hypocalcemia- His calcium level is normal now, this was associated with the septicemia and has resolved. -     Basic metabolic panel; Future  Essential hypertension- his blood pressure is well-controlled, electrolytes and renal function are normal. -     CBC with Differential/Platelet; Future -     Basic metabolic panel; Future -     Urinalysis, Routine w reflex microscopic; Future -     Thyroid Panel With TSH; Future  E coli bacteremia- his symptoms and exam he is doing much better, his white cell count has normalized. He'll complete the course of Levaquin. -     CBC with Differential/Platelet; Future  Palpitations- his EKG is normal, the palpitations sound benign. If he has any recurrences he will let me know and I will consider ordering a Holter monitor. -     Thyroid Panel With TSH; Future -     EKG 12-Lead   I have discontinued Charles Parker's chlorproMAZINE, multivitamin with minerals, levofloxacin, and levofloxacin. I am also having him maintain his simvastatin, B-D SINGLE USE SWABS REGULAR, TRUE METRIX METER, TRUEPLUS LANCETS 33G, rivaroxaban, glucose blood, lisinopril, Insulin Pen Needle, Insulin  Glargine, and HYDROcodone-acetaminophen. We will stop administering sodium chloride.  No orders of the defined types were placed in this encounter.    Follow-up: Return in about 4 weeks (around 10/06/2016).  Scarlette Calico, MD

## 2016-09-08 NOTE — Patient Instructions (Signed)
Palpitations A palpitation is the feeling that your heartbeat is irregular or is faster than normal. It may feel like your heart is fluttering or skipping a beat. Palpitations are usually not a serious problem. They may be caused by many things, including smoking, caffeine, alcohol, stress, and certain medicines. Although most causes of palpitations are not serious, palpitations can be a sign of a serious medical problem. In some cases, you may need further medical evaluation. Follow these instructions at home: Pay attention to any changes in your symptoms. Take these actions to help with your condition:  Avoid the following: ? Caffeinated coffee, tea, soft drinks, diet pills, and energy drinks. ? Chocolate. ? Alcohol.  Do not use any tobacco products, such as cigarettes, chewing tobacco, and e-cigarettes. If you need help quitting, ask your health care provider.  Try to reduce your stress and anxiety. Things that can help you relax include: ? Yoga. ? Meditation. ? Physical activity, such as swimming, jogging, or walking. ? Biofeedback. This is a method that helps you learn to use your mind to control things in your body, such as your heartbeats.  Get plenty of rest and sleep.  Take over-the-counter and prescription medicines only as told by your health care provider.  Keep all follow-up visits as told by your health care provider. This is important.  Contact a health care provider if:  You continue to have a fast or irregular heartbeat after 24 hours.  Your palpitations occur more often. Get help right away if:  You have chest pain or shortness of breath.  You have a severe headache.  You feel dizzy or you faint. This information is not intended to replace advice given to you by your health care provider. Make sure you discuss any questions you have with your health care provider. Document Released: 09/17/2000 Document Revised: 02/23/2016 Document Reviewed: 06/05/2015 Elsevier  Interactive Patient Education  2017 [employer]vier Inc.  

## 2016-09-08 NOTE — Progress Notes (Signed)
Pre visit review using our clinic review tool, if applicable. No additional management support is needed unless otherwise documented below in the visit note. 

## 2016-09-09 LAB — THYROID PANEL WITH TSH
FREE THYROXINE INDEX: 2.4 (ref 1.4–3.8)
T3 UPTAKE: 31 % (ref 22–35)
T4, Total: 7.9 ug/dL (ref 4.5–12.0)
TSH: 0.67 m[IU]/L (ref 0.40–4.50)

## 2016-09-10 ENCOUNTER — Other Ambulatory Visit: Payer: Self-pay

## 2016-09-10 NOTE — Patient Outreach (Signed)
[name]) Care Management  09/10/2016   Charles Parker 02-Oct-1946 [id number]  Subjective:  I feel like I am doing a little better. I am a checking my blood ugars. I have an appointment at the diabetes center on the 20th  Objective:  Telephonic encounter  Current Medications:  Current Outpatient Prescriptions  Medication Sig Dispense Refill  . Alcohol Swabs (B-D SINGLE USE SWABS REGULAR) PADS Use as directed Dx E11.9 180 each 3  . Blood Glucose Monitoring Suppl (TRUE METRIX METER) W/DEVICE KIT Use as directed  Dx E11.9 1 kit 0  . glucose blood (TRUE METRIX BLOOD GLUCOSE TEST) test strip 1 each by Other route 2 (two) times daily. Use to check blood sugars twice a day Dx E11.9 180 each 0  . HYDROcodone-acetaminophen (NORCO/VICODIN) 5-325 MG tablet Take 1 tablet by mouth every 6 (six) hours as needed for moderate pain. 10 tablet 0  . Insulin Glargine (LANTUS SOLOSTAR) 100 UNIT/ML Solostar Pen Inject 40 units every night at bedtime 15 mL 0  . Insulin Pen Needle 31G X 5 MM MISC Administer lantus once nightly 90 each 0  . lisinopril (PRINIVIL,ZESTRIL) 5 MG tablet Take 1 tablet (5 mg total) by mouth daily. 30 tablet 0  . rivaroxaban (XARELTO) 20 MG TABS tablet Take 1 tablet (20 mg total) by mouth daily with supper. 90 tablet 0  . simvastatin (ZOCOR) 40 MG tablet Take 1 tablet (40 mg total) by mouth every evening. 90 tablet 3  . TRUEPLUS LANCETS 33G MISC Use to help check blood sugars twice a day Dx E11.9 180 each 3   No current facility-administered medications for this visit.     Functional Status:  In your present state of health, do you have any difficulty performing the following activities: 08/23/2016 08/17/2016  Hearing? N N  Vision? Y N  Difficulty concentrating or making decisions? Y N  Walking or climbing stairs? Y Y  Dressing or bathing? N N  Doing errands, shopping? Y N  Preparing Food and eating ? N -  Using the Toilet? N -  In the past six  months, have you accidently leaked urine? N -  Do you have problems with loss of bowel control? N -  Managing your Medications? Y -  Managing your Finances? N -  Housekeeping or managing your Housekeeping? Y -  Some recent data might be hidden    Fall/Depression Screening: PHQ 2/9 Scores 08/24/2016 01/01/2016 08/18/2015 07/17/2015 07/14/2015 11/02/2014 11/01/2014  PHQ - 2 Score 0 0 0 0 0 0 0    THN CM Care Plan Problem One   Flowsheet Row Most Recent Value  Care Plan Problem One  Patient recently discharged from acute care  Role Documenting the Problem One  Care Management [place] for Problem One  Active  THN Long Term Goal (31-90 days)  In the next 31 days, patient will hae not acute care admissions for hyperglycemia  THN Long Term Goal Start Date  08/23/16  Interventions for Problem One Long Term Goal  telephone call made to  assess patient's progress towards reaching goal.  Patient denies hospitalization since last assessment  THN CM Short Term Goal #1 (0-30 days)  In the next 21 days, patient will meet iwth [place] care coordinator for diabetes education  [facility] CM Short Term Goal #1 Start Date  08/24/16  [facility] CM Short Term Goal #1 Met Date  09/02/16  Interventions for Short Term Goal #1  initilal home visit  for medication reconcilliation, diabetic education, filled out medicaid application and completed renewal paperwork for  SNAP benefits  THN CM Short Term Goal #2 (0-30 days)  In the next 21 days, patient will measure and record his blood glucose level 14 out of 21 times.    THN CM Short Term Goal #2 Start Date  08/23/16  Interventions for Short Term Goal #2  Patient endores twice a day glucose checks be performed by himself    [facility] CM Care Plan Problem Two   Flowsheet Row Most Recent Value  Care Plan Problem Two  patient lacks knowledge related to diabetes, management of diabetes  Role Documenting the Problem Two  Care Management Coordinator  Care Plan for  Problem Two  Active  THN CM Short Term Goal #1 (0-30 days)  In the next 28 days, patietn will have  made contact with [facility] to schedule an appointment  [facility] CM Short Term Goal #1 Start Date  09/03/16  [facility] CM Short Term Goal #1 Met Date   09/10/16  Interventions for Short Term Goal #2   telepone call, patient states he  has an appointment at center on 09/22/2016     Assessment:  Patient reports twice a day blood glucose checks. Patient has appointment with [facility] on 12/20. Patient and [name] completed his  Medicaid and SNAP Applications, mailed  Plan:  Home visit later this month for assessment of patient's progression in reaching his case management goals.

## 2016-09-13 ENCOUNTER — Other Ambulatory Visit: Payer: Self-pay | Admitting: Internal Medicine

## 2016-09-13 DIAGNOSIS — E785 Hyperlipidemia, unspecified: Secondary | ICD-10-CM

## 2016-09-13 DIAGNOSIS — E113319 Type 2 diabetes mellitus with moderate nonproliferative diabetic retinopathy with macular edema, unspecified eye: Secondary | ICD-10-CM

## 2016-09-16 ENCOUNTER — Other Ambulatory Visit: Payer: Self-pay

## 2016-09-16 NOTE — Patient Outreach (Signed)
[facility]) Care Management   09/16/2016  Charles Parker May 28, 1946 [id number]  Charles Parker is an 70 y.o. male  Subjective:  My pain is a little better. I am making it slowly.  Objective:   ROS  Physical Exam ROS  Encounter Medications:   Outpatient Encounter Prescriptions as of 09/16/2016  Medication Sig  . Alcohol Swabs (B-D SINGLE USE SWABS REGULAR) PADS Use as directed Dx E11.9  . Blood Glucose Monitoring Suppl (TRUE METRIX METER) W/DEVICE KIT Use as directed  Dx E11.9  . glucose blood (TRUE METRIX BLOOD GLUCOSE TEST) test strip 1 each by Other route 2 (two) times daily. Use to check blood sugars twice a day Dx E11.9  . HYDROcodone-acetaminophen (NORCO/VICODIN) 5-325 MG tablet Take 1 tablet by mouth every 6 (six) hours as needed for moderate pain.  . Insulin Glargine (LANTUS SOLOSTAR) 100 UNIT/ML Solostar Pen Inject 40 units every night at bedtime  . Insulin Pen Needle 31G X 5 MM MISC Administer lantus once nightly  . lisinopril (PRINIVIL,ZESTRIL) 5 MG tablet Take 1 tablet (5 mg total) by mouth daily.  . rivaroxaban (XARELTO) 20 MG TABS tablet Take 1 tablet (20 mg total) by mouth daily with supper.  . simvastatin (ZOCOR) 40 MG tablet Take 1 tablet (40 mg total) by mouth every evening.  . simvastatin (ZOCOR) 40 MG tablet TAKE 1 TABLET(40 MG) BY MOUTH EVERY EVENING  . TRUEPLUS LANCETS 33G MISC Use to help check blood sugars twice a day Dx E11.9   No facility-administered encounter medications on file as of 09/16/2016.     Functional Status:   In your present state of health, do you have any difficulty performing the following activities: 08/23/2016 08/17/2016  Hearing? N N  Vision? Y N  Difficulty concentrating or making decisions? Y N  Walking or climbing stairs? Y Y  Dressing or bathing? N N  Doing errands, shopping? Y N  Preparing Food and eating ? N -  Using the Toilet? N -  In the past six months, have you accidently leaked urine? N -   Do you have problems with loss of bowel control? N -  Managing your Medications? Y -  Managing your Finances? N -  Housekeeping or managing your Housekeeping? Y -  Some recent data might be hidden    Fall/Depression Screening:    PHQ 2/9 Scores 08/24/2016 01/01/2016 08/18/2015 07/17/2015 07/14/2015 11/02/2014 11/01/2014  PHQ - 2 Score 0 0 0 0 0 0 0   THN CM Care Plan Problem One   Flowsheet Row Most Recent Value  Care Plan Problem One  Patient recently discharged from acute care  Role Documenting the Problem One  Care Management [place] for Problem One  Active  THN Long Term Goal (31-90 days)  In the next 31 days, patient will hae not acute care admissions for hyperglycemia  THN Long Term Goal Start Date  08/23/16  Interventions for Problem One Long Term Goal  telephone call made to  assess patient's progress towards reaching goal.  Patient denies hospitalization since last assessment  THN CM Short Term Goal #1 (0-30 days)  In the next 21 days, patient will meet iwth [place] care coordinator for diabetes education  [facility] CM Short Term Goal #1 Start Date  08/24/16  [facility] CM Short Term Goal #1 Met Date  09/02/16  Interventions for Short Term Goal #1  initilal home visit for medication reconcilliation, diabetic education, filled out medicaid application and completed  renewal paperwork for  SNAP benefits  THN CM Short Term Goal #2 (0-30 days)  In the next 21 days, patient will measure and record his blood glucose level 14 out of 21 times.    THN CM Short Term Goal #2 Start Date  08/23/16  THN CM Short Term Goal #2 Met Date  09/16/16  Interventions for Short Term Goal #2  Home visit to assess patient progress in meeting his case management goals    [facility] CM Care Plan Problem Two   Flowsheet Row Most Recent Value  Care Plan Problem Two  patient lacks knowledge related to diabetes, management of diabetes  Role Documenting the Problem Two  Care Management Coordinator  Care Plan  for Problem Two  Active  THN CM Short Term Goal #1 (0-30 days)  In the next 28 days, patietn will have  made contact with [facility] to schedule an appointment  [facility] CM Short Term Goal #1 Start Date  09/03/16  [facility] CM Short Term Goal #1 Met Date   09/10/16  Interventions for Short Term Goal #2   telepone call, patient states he  has an appointment at center on 09/22/2016     Assessment:   awiating response from application from [facility] Patient lives alone, is able to perform ADLs, brothers assist with paying bills.  Plan:  Telephone contact later this month for assessment of community care needs.

## 2016-09-22 ENCOUNTER — Encounter: Payer: Commercial Managed Care - HMO | Attending: Internal Medicine | Admitting: *Deleted

## 2016-09-22 DIAGNOSIS — E08319 Diabetes mellitus due to underlying condition with unspecified diabetic retinopathy without macular edema: Secondary | ICD-10-CM | POA: Diagnosis not present

## 2016-09-22 DIAGNOSIS — Z713 Dietary counseling and surveillance: Secondary | ICD-10-CM | POA: Insufficient documentation

## 2016-09-22 NOTE — Patient Instructions (Addendum)
We will call your doctor know about your low blood sugar (less than 70).  Recent weight loss may be contributing.  Plan:  Aim for 3 Carb Choices per meal +/- 1 either way  Include protein in moderation with your meals and snacks Consider protein drink (Glucerna) or sugar free Carnation Instant Breakfast when you are not hungry for a meal. Continue walking as tolerated. Consider increasing your activity level by starting some chair exercises daily as tolerated Continue checking BG at alternate times during the day as directed by MD  Consider taking medication as directed by MD

## 2016-09-23 ENCOUNTER — Telehealth: Payer: Self-pay

## 2016-09-23 ENCOUNTER — Other Ambulatory Visit: Payer: Self-pay | Admitting: Internal Medicine

## 2016-09-23 MED ORDER — INSULIN GLARGINE 100 UNIT/ML SOLOSTAR PEN: PEN_INJECTOR | SUBCUTANEOUS | 0 refills | Status: DC

## 2016-09-23 NOTE — Telephone Encounter (Signed)
Decrease lantus dose to 30 u per day

## 2016-09-23 NOTE — Progress Notes (Signed)
Diabetes Self-Management Education  Visit Type:  First/Initial  Appt. Start Time: 0215 pm Appt. End Time: 0330 pm  09/23/2016  [name], identified by name and date of birth, is a 70 y.o. male with a diagnosis of Diabetes: Type 2.  Brother attended visit with patient. Brother reports that patient has changed eating habits recently. Recent dietary changes may have contributed to the ~10 lb weight loss since the last weight taken November 2017. Recent hospitalization for kidney infection. Patient brought BG log book with entries for most days of the week.   ASSESSMENT  Height 6' (1.829 m), weight 172 lb 12.8 oz (78.4 kg). Body mass index is 23.44 kg/m.       Diabetes Self-Management Education - 09/22/16 1436      Health Coping   How would you rate your overall health? Good     Psychosocial Assessment   Patient Belief/Attitude about Diabetes Motivated to manage diabetes   Self-care barriers Other (comment)  Brother assists with communication   Self-management support Family   Patient Concerns Nutrition/Meal planning;Glycemic Control   Special Needs None   Preferred Learning Style No preference indicated   Learning Readiness Change in progress     Complications   Last HgB A1C per patient/outside source 12.7 %  11/2015   How often do you check your blood sugar? 1-2 times/day   Fasting Blood glucose range (mg/dL) 70-129;130-179   Postprandial Blood glucose range (mg/dL) 130-179;70-129   Number of hypoglycemic episodes per month 2   Can you tell when your blood sugar is low? No   Have you had a dilated eye exam in the past 12 months? Yes   Have you had a dental exam in the past 12 months? Yes   Are you checking your feet? Yes   How many days per week are you checking your feet? 1     Dietary Intake   Breakfast unsweet cereal   Snack (morning) nabs   Lunch cereal OR sandwich chicken OR burger and fries   Snack (afternoon) none   Dinner meatloaf OR microwave dinners     Snack (evening) sometimes peanut butters cookies   Beverage(s) diet soda, water, cranberry juice     Exercise   How many days per week to you exercise? 2   How many minutes per day do you exercise? 20   Total minutes per week of exercise 40     Patient Education   Previous Diabetes Education No   Nutrition management  Carbohydrate counting;Meal options for control of blood glucose level and chronic complications.   Physical activity and exercise  Role of exercise on diabetes management, blood pressure control and cardiac health.;Helped patient identify appropriate exercises in relation to his/her diabetes, diabetes complications and other health issue.   Medications Reviewed patients medication for diabetes, action, purpose, timing of dose and side effects.   Monitoring Identified appropriate SMBG and/or A1C goals.   Acute complications Taught treatment of hypoglycemia - the 15 rule.     Individualized Goals (developed by patient)   Nutrition General guidelines for healthy choices and portions discussed   Physical Activity Exercise 3-5 times per week   Medications take my medication as prescribed  Plan to notify doctor low blood sugars   Monitoring  test my blood glucose as discussed     Post-Education Assessment   Patient understands incorporating nutritional management into lifestyle. Demonstrates understanding / competency   Patient undertands incorporating physical activity into lifestyle. Demonstrates understanding /  competency   Patient understands using medications safely. Demonstrates understanding / competency   Patient understands monitoring blood glucose, interpreting and using results Demonstrates understanding / competency     Outcomes   Program Status Completed      Learning Objective:  Patient will have a greater understanding of diabetes self-management. Patient education plan is to attend individual and/or group sessions per assessed needs and  concerns.   Plan:   Patient Instructions  We will call your doctor know about your low blood sugar (less than 70).  Recent weight loss may be contributing.  Plan:  Aim for 3 Carb Choices per meal +/- 1 either way  Include protein in moderation with your meals and snacks Consider protein drink (Glucerna) or sugar free Carnation Instant Breakfast when you are not hungry for a meal. Continue walking as tolerated. Consider increasing your activity level by starting some chair exercises daily as tolerated Continue checking BG at alternate times during the day as directed by MD  Consider taking medication as directed by MD         Expected Outcomes:  Demonstrated interest in learning. Expect positive outcomes  Education material provided: Living Well with Diabetes and Meal plan card, arm chair exercises  If problems or questions, patient to contact team via:  Phone and Email  Future DSME appointment: - PRN

## 2016-09-23 NOTE — Telephone Encounter (Signed)
Noted, patient to be seen next month as a new patient

## 2016-09-23 NOTE — Telephone Encounter (Signed)
Left detail message for Rise Paganini with MD recommendation.   Contacted pt and gave him MD recommendation. Pt repeated the Lantus change.   Forwarding to other care team members as an [place]

## 2016-09-23 NOTE — Telephone Encounter (Signed)
Rise Paganini called ans wanted to infomre that pt has been experiencing a lot of low BS lately. Reading have been in the 50-60 range and wanted to know if pt should lower their dose of lantus. Please advise.

## 2016-09-26 ENCOUNTER — Emergency Department (HOSPITAL_COMMUNITY): Payer: Commercial Managed Care - HMO

## 2016-09-26 ENCOUNTER — Inpatient Hospital Stay (HOSPITAL_COMMUNITY)
Admission: EM | Admit: 2016-09-26 | Discharge: 2016-10-06 | DRG: 871 | Disposition: A | Discharge status: Skilled Nursing Facility | Payer: Commercial Managed Care - HMO | Attending: Internal Medicine | Admitting: Internal Medicine

## 2016-09-26 ENCOUNTER — Encounter (HOSPITAL_COMMUNITY): Payer: Self-pay

## 2016-09-26 DIAGNOSIS — A419 Sepsis, unspecified organism: Principal | ICD-10-CM | POA: Diagnosis present

## 2016-09-26 DIAGNOSIS — G4733 Obstructive sleep apnea (adult) (pediatric): Secondary | ICD-10-CM | POA: Diagnosis present

## 2016-09-26 DIAGNOSIS — Z794 Long term (current) use of insulin: Secondary | ICD-10-CM

## 2016-09-26 DIAGNOSIS — N179 Acute kidney failure, unspecified: Secondary | ICD-10-CM | POA: Diagnosis not present

## 2016-09-26 DIAGNOSIS — R7881 Bacteremia: Secondary | ICD-10-CM | POA: Diagnosis not present

## 2016-09-26 DIAGNOSIS — Z888 Allergy status to other drugs, medicaments and biological substances status: Secondary | ICD-10-CM

## 2016-09-26 DIAGNOSIS — Z8249 Family history of ischemic heart disease and other diseases of the circulatory system: Secondary | ICD-10-CM

## 2016-09-26 DIAGNOSIS — R7989 Other specified abnormal findings of blood chemistry: Secondary | ICD-10-CM

## 2016-09-26 DIAGNOSIS — Z79899 Other long term (current) drug therapy: Secondary | ICD-10-CM | POA: Diagnosis not present

## 2016-09-26 DIAGNOSIS — I824Y9 Acute embolism and thrombosis of unspecified deep veins of unspecified proximal lower extremity: Secondary | ICD-10-CM | POA: Diagnosis present

## 2016-09-26 DIAGNOSIS — R2689 Other abnormalities of gait and mobility: Secondary | ICD-10-CM | POA: Diagnosis not present

## 2016-09-26 DIAGNOSIS — M5126 Other intervertebral disc displacement, lumbar region: Secondary | ICD-10-CM | POA: Diagnosis not present

## 2016-09-26 DIAGNOSIS — M549 Dorsalgia, unspecified: Secondary | ICD-10-CM

## 2016-09-26 DIAGNOSIS — R0789 Other chest pain: Secondary | ICD-10-CM | POA: Diagnosis present

## 2016-09-26 DIAGNOSIS — I252 Old myocardial infarction: Secondary | ICD-10-CM | POA: Diagnosis not present

## 2016-09-26 DIAGNOSIS — E119 Type 2 diabetes mellitus without complications: Secondary | ICD-10-CM | POA: Diagnosis not present

## 2016-09-26 DIAGNOSIS — M6281 Muscle weakness (generalized): Secondary | ICD-10-CM | POA: Diagnosis not present

## 2016-09-26 DIAGNOSIS — M4646 Discitis, unspecified, lumbar region: Secondary | ICD-10-CM | POA: Diagnosis not present

## 2016-09-26 DIAGNOSIS — N400 Enlarged prostate without lower urinary tract symptoms: Secondary | ICD-10-CM | POA: Diagnosis present

## 2016-09-26 DIAGNOSIS — E7800 Pure hypercholesterolemia, unspecified: Secondary | ICD-10-CM | POA: Diagnosis present

## 2016-09-26 DIAGNOSIS — F419 Anxiety disorder, unspecified: Secondary | ICD-10-CM | POA: Diagnosis present

## 2016-09-26 DIAGNOSIS — Z885 Allergy status to narcotic agent status: Secondary | ICD-10-CM | POA: Diagnosis not present

## 2016-09-26 DIAGNOSIS — F329 Major depressive disorder, single episode, unspecified: Secondary | ICD-10-CM | POA: Diagnosis present

## 2016-09-26 DIAGNOSIS — N390 Urinary tract infection, site not specified: Secondary | ICD-10-CM | POA: Diagnosis present

## 2016-09-26 DIAGNOSIS — Z7901 Long term (current) use of anticoagulants: Secondary | ICD-10-CM

## 2016-09-26 DIAGNOSIS — H409 Unspecified glaucoma: Secondary | ICD-10-CM | POA: Diagnosis present

## 2016-09-26 DIAGNOSIS — R1032 Left lower quadrant pain: Secondary | ICD-10-CM | POA: Diagnosis present

## 2016-09-26 DIAGNOSIS — I10 Essential (primary) hypertension: Secondary | ICD-10-CM | POA: Diagnosis present

## 2016-09-26 DIAGNOSIS — G062 Extradural and subdural abscess, unspecified: Secondary | ICD-10-CM | POA: Diagnosis present

## 2016-09-26 DIAGNOSIS — N12 Tubulo-interstitial nephritis, not specified as acute or chronic: Secondary | ICD-10-CM | POA: Diagnosis not present

## 2016-09-26 DIAGNOSIS — M4627 Osteomyelitis of vertebra, lumbosacral region: Secondary | ICD-10-CM | POA: Diagnosis not present

## 2016-09-26 DIAGNOSIS — R109 Unspecified abdominal pain: Secondary | ICD-10-CM | POA: Diagnosis not present

## 2016-09-26 DIAGNOSIS — E785 Hyperlipidemia, unspecified: Secondary | ICD-10-CM | POA: Diagnosis present

## 2016-09-26 DIAGNOSIS — R079 Chest pain, unspecified: Secondary | ICD-10-CM | POA: Diagnosis not present

## 2016-09-26 DIAGNOSIS — R6520 Severe sepsis without septic shock: Secondary | ICD-10-CM | POA: Diagnosis not present

## 2016-09-26 DIAGNOSIS — Z86718 Personal history of other venous thrombosis and embolism: Secondary | ICD-10-CM | POA: Diagnosis not present

## 2016-09-26 DIAGNOSIS — R531 Weakness: Secondary | ICD-10-CM | POA: Diagnosis not present

## 2016-09-26 DIAGNOSIS — A4151 Sepsis due to Escherichia coli [E. coli]: Secondary | ICD-10-CM | POA: Diagnosis present

## 2016-09-26 DIAGNOSIS — R740 Nonspecific elevation of levels of transaminase and lactic acid dehydrogenase [LDH]: Secondary | ICD-10-CM | POA: Diagnosis not present

## 2016-09-26 DIAGNOSIS — Z9181 History of falling: Secondary | ICD-10-CM | POA: Diagnosis not present

## 2016-09-26 LAB — COMPREHENSIVE METABOLIC PANEL
ALT: 21 U/L (ref 17–63)
ANION GAP: 14 (ref 5–15)
AST: 32 U/L (ref 15–41)
Albumin: 2.9 g/dL — ABNORMAL LOW (ref 3.5–5.0)
Alkaline Phosphatase: 74 U/L (ref 38–126)
BUN: 15 mg/dL (ref 6–20)
CALCIUM: 9.2 mg/dL (ref 8.9–10.3)
CHLORIDE: 100 mmol/L — AB (ref 101–111)
CO2: 21 mmol/L — ABNORMAL LOW (ref 22–32)
CREATININE: 1.41 mg/dL — AB (ref 0.61–1.24)
GFR, EST AFRICAN AMERICAN: 57 mL/min — AB (ref >60)
GFR, EST NON AFRICAN AMERICAN: 49 mL/min — AB (ref >60)
Glucose, Bld: 197 mg/dL — ABNORMAL HIGH (ref 65–99)
Potassium: 4 mmol/L (ref 3.5–5.1)
SODIUM: 135 mmol/L (ref 135–145)
Total Bilirubin: 0.8 mg/dL (ref 0.3–1.2)
Total Protein: 8.5 g/dL — ABNORMAL HIGH (ref 6.5–8.1)

## 2016-09-26 LAB — URINALYSIS, ROUTINE W REFLEX MICROSCOPIC
Bilirubin Urine: NEGATIVE
GLUCOSE, UA: NEGATIVE mg/dL
Ketones, ur: 20 mg/dL — AB
NITRITE: POSITIVE — AB
PH: 5 (ref 5.0–8.0)
Protein, ur: 30 mg/dL — AB
Specific Gravity, Urine: 1.018 (ref 1.005–1.030)
Squamous Epithelial / LPF: NONE SEEN

## 2016-09-26 LAB — PROCALCITONIN: Procalcitonin: 110.91 ng/mL

## 2016-09-26 LAB — CBC
HCT: 34.2 % — ABNORMAL LOW (ref 39.0–52.0)
HEMOGLOBIN: 11.2 g/dL — AB (ref 13.0–17.0)
MCH: 28.9 pg (ref 26.0–34.0)
MCHC: 32.7 g/dL (ref 30.0–36.0)
MCV: 88.1 fL (ref 78.0–100.0)
PLATELETS: 307 10*3/uL (ref 150–400)
RBC: 3.88 MIL/uL — AB (ref 4.22–5.81)
RDW: 13.1 % (ref 11.5–15.5)
WBC: 19.6 10*3/uL — AB (ref 4.0–10.5)

## 2016-09-26 LAB — I-STAT TROPONIN, ED: TROPONIN I, POC: 0 ng/mL (ref 0.00–0.08)

## 2016-09-26 LAB — PROTIME-INR
INR: 1.37
Prothrombin Time: 16.9 seconds — ABNORMAL HIGH (ref 11.4–15.2)

## 2016-09-26 LAB — I-STAT CG4 LACTIC ACID, ED
Lactic Acid, Venous: 4.05 mmol/L (ref 0.5–1.9)
Lactic Acid, Venous: 2.87 mmol/L (ref 0.5–1.9)

## 2016-09-26 LAB — LACTIC ACID, PLASMA: LACTIC ACID, VENOUS: 1.7 mmol/L (ref 0.5–1.9)

## 2016-09-26 LAB — APTT: APTT: 35 s (ref 24–36)

## 2016-09-26 MED ORDER — MORPHINE SULFATE (PF) 4 MG/ML IV SOLN
2.0000 mg | INTRAVENOUS | Status: DC | PRN
  Administered 2016-09-27 – 2016-09-29 (×8): 2 mg via INTRAVENOUS
  Filled 2016-09-26 (×8): qty 1

## 2016-09-26 MED ORDER — SODIUM CHLORIDE 0.9 % IV BOLUS (SEPSIS)
1000.0000 mL | Freq: Once | INTRAVENOUS | Status: AC
  Administered 2016-09-26: 1000 mL via INTRAVENOUS

## 2016-09-26 MED ORDER — INSULIN ASPART 100 UNIT/ML ~~LOC~~ SOLN
0.0000 [IU] | Freq: Three times a day (TID) | SUBCUTANEOUS | Status: DC
  Administered 2016-09-27 – 2016-10-03 (×8): 2 [IU] via SUBCUTANEOUS
  Administered 2016-10-04 – 2016-10-05 (×2): 3 [IU] via SUBCUTANEOUS
  Administered 2016-10-06: 2 [IU] via SUBCUTANEOUS

## 2016-09-26 MED ORDER — HYDROCODONE-ACETAMINOPHEN 5-325 MG PO TABS
1.0000 | ORAL_TABLET | Freq: Four times a day (QID) | ORAL | Status: DC | PRN
  Administered 2016-09-27 – 2016-10-05 (×17): 1 via ORAL
  Filled 2016-09-26 (×17): qty 1

## 2016-09-26 MED ORDER — IOPAMIDOL (ISOVUE-300) INJECTION 61%
INTRAVENOUS | Status: AC
  Administered 2016-09-26: 100 mL
  Filled 2016-09-26: qty 100

## 2016-09-26 MED ORDER — INSULIN ASPART 100 UNIT/ML ~~LOC~~ SOLN: 0.0000 [IU] | Freq: Every day | SUBCUTANEOUS | Status: DC

## 2016-09-26 MED ORDER — ONDANSETRON HCL 4 MG PO TABS: 4.0000 mg | ORAL_TABLET | Freq: Four times a day (QID) | ORAL | Status: DC | PRN

## 2016-09-26 MED ORDER — SIMVASTATIN 40 MG PO TABS
40.0000 mg | ORAL_TABLET | Freq: Every day | ORAL | Status: DC
  Administered 2016-09-27 – 2016-10-06 (×10): 40 mg via ORAL
  Filled 2016-09-26 (×8): qty 1
  Filled 2016-09-26: qty 2
  Filled 2016-09-26: qty 1

## 2016-09-26 MED ORDER — SODIUM CHLORIDE 0.9% FLUSH
3.0000 mL | Freq: Two times a day (BID) | INTRAVENOUS | Status: DC
  Administered 2016-09-27 – 2016-10-05 (×13): 3 mL via INTRAVENOUS

## 2016-09-26 MED ORDER — ADULT MULTIVITAMIN W/MINERALS CH
1.0000 | ORAL_TABLET | Freq: Every day | ORAL | Status: DC
  Administered 2016-09-27 – 2016-10-06 (×10): 1 via ORAL
  Filled 2016-09-26 (×10): qty 1

## 2016-09-26 MED ORDER — ONDANSETRON HCL 4 MG/2ML IJ SOLN: 4.0000 mg | Freq: Four times a day (QID) | INTRAMUSCULAR | Status: DC | PRN

## 2016-09-26 MED ORDER — SODIUM CHLORIDE 0.9 % IV SOLN
INTRAVENOUS | Status: DC
  Administered 2016-09-27: via INTRAVENOUS

## 2016-09-26 MED ORDER — INSULIN GLARGINE 100 UNIT/ML ~~LOC~~ SOLN
30.0000 [IU] | Freq: Every day | SUBCUTANEOUS | Status: DC
  Administered 2016-09-27 – 2016-10-01 (×6): 30 [IU] via SUBCUTANEOUS
  Filled 2016-09-26 (×7): qty 0.3

## 2016-09-26 MED ORDER — SODIUM CHLORIDE 0.9 % IV BOLUS (SEPSIS)
500.0000 mL | Freq: Once | INTRAVENOUS | Status: AC
  Administered 2016-09-26: 500 mL via INTRAVENOUS

## 2016-09-26 MED ORDER — MORPHINE SULFATE (PF) 4 MG/ML IV SOLN
4.0000 mg | Freq: Once | INTRAVENOUS | Status: AC
  Administered 2016-09-26: 4 mg via INTRAVENOUS
  Filled 2016-09-26: qty 1

## 2016-09-26 MED ORDER — DEXTROSE 5 % IV SOLN: 2.0000 g | Freq: Once | INTRAVENOUS | Status: DC

## 2016-09-26 MED ORDER — RIVAROXABAN 20 MG PO TABS
20.0000 mg | ORAL_TABLET | Freq: Every day | ORAL | Status: DC
  Administered 2016-09-27 – 2016-10-06 (×9): 20 mg via ORAL
  Filled 2016-09-26 (×12): qty 1

## 2016-09-26 MED ORDER — DEXTROSE 5 % IV SOLN
2.0000 g | Freq: Once | INTRAVENOUS | Status: AC
  Administered 2016-09-26: 2 g via INTRAVENOUS
  Filled 2016-09-26: qty 2

## 2016-09-26 MED ORDER — DEXTROSE 5 % IV SOLN
1.0000 g | Freq: Two times a day (BID) | INTRAVENOUS | Status: DC
  Administered 2016-09-27: 1 g via INTRAVENOUS
  Filled 2016-09-26 (×2): qty 1

## 2016-09-26 MED ORDER — DEXTROSE 5 % IV SOLN: 2.0000 g | INTRAVENOUS | Status: DC

## 2016-09-26 MED ORDER — TAMSULOSIN HCL 0.4 MG PO CAPS
0.4000 mg | ORAL_CAPSULE | Freq: Every day | ORAL | Status: DC
  Administered 2016-09-27 – 2016-10-06 (×10): 0.4 mg via ORAL
  Filled 2016-09-26 (×10): qty 1

## 2016-09-26 NOTE — Progress Notes (Signed)
Pharmacy Antibiotic Note  Charles Parker is a 70 y.o. male admitted on 09/26/2016 with Sepsis due to suspected UTI source.  Patient is currently afebrile, but hypotensive and tachycardic. He describes left sided flank pain that radiates to his left lower back for the past week after being admitted with right kidney infection on 08/17/16. Blood cultures at the time grew E. Coli susceptible to ceftriaxone, however urine cultures never grew out a specific organism. Current UA, urine culture, and CBC in process. Pharmacy has been consulted for ceftriaxone dosing. He received ceftriaxone in the ED 12/24 at 1845. As ceftriaxone does not require renal adjustment, will sign off.   Plan: Ceftriaxone 2gm IV q24h starting tomorrow Monitor cultures, sensitivities, clinical status, length of therapy Pharmacy will sign off   Temp (24hrs), Avg:99.4 F (37.4 C), Min:99.4 F (37.4 C), Max:99.4 F (37.4 C)  No results for input(s): WBC, CREATININE, LATICACIDVEN, VANCOTROUGH, VANCOPEAK, VANCORANDOM, GENTTROUGH, GENTPEAK, GENTRANDOM, TOBRATROUGH, TOBRAPEAK, TOBRARND, AMIKACINPEAK, AMIKACINTROU, AMIKACIN in the last 168 hours.  Estimated Creatinine Clearance: 90.9 mL/min (by C-G formula based on SCr of 0.83 mg/dL).    Allergies  Allergen Reactions  . Metformin And Related Diarrhea  . Codeine Rash    All over the body    Antimicrobials this admission: 12/24 Ceftriaxone >>    Microbiology results: 12/24 UCx: pending   Thank you for allowing pharmacy to be a part of this patient's care.  Dierdre Harness, Cain Sieve, PharmD Clinical Pharmacy Resident [phone] (Pager) 09/26/2016 6:57 PM

## 2016-09-26 NOTE — ED Provider Notes (Signed)
[facility] DEPT Provider Note   CSN: [id number] Arrival date & time: 09/26/16  1804  History   Chief Complaint Chief Complaint  Patient presents with  . Chest Pain  . Flank Pain    HPI Charles Parker is a 70 y.o. male.  HPI  70 y.o. male with a hx of DM2, DVT, presents to the Emergency Department today complaining of left sided back pain as well as chest pain x 1 week. Pt states that he was seen late November for flank pain and diagnosed with a kidney infection. Treated with ABX. Pt notes no improvement since then. Notes worsening symptoms. States CP occurred after left sided back started hurting. Notes pain is intermittent. Mild shortness of breath. No N/V. No diaphoresis. No diarrhea. Subjective fevers at home. Pt took Ibuprofen PTA. Endorses lower abdominal pain without dysuria. No other symptoms noted.        Past Medical History:  Diagnosis Date  . Clotting disorder (HCC)    DVT  . Diabetes mellitus    type II  . Diverticulosis   . DVT (deep venous thrombosis) ([place])   . Glaucoma    no eye drops   . Hepatic cyst   . Hypercholesterolemia   . Hypertension   . Myocardial infarction    very mild long ago   . Renal cyst   . Sleep apnea   . Tubulovillous adenoma     Patient Active Problem List   Diagnosis Date Noted  . Palpitations 09/08/2016  . E coli bacteremia 08/19/2016  . Acute pyelonephritis 08/17/2016  . Rotator cuff tendinitis 12/12/2015  . Diabetes mellitus with ophthalmic complication ([place]) 27/61/4709  . Tremor 11/11/2015  . Overgrown toenails 11/11/2015  . Primary osteoarthritis of both knees 07/14/2015  . Acute prostatitis 11/01/2014  . DVT, lower extremity, proximal ([place]) 07/10/2014  . Routine general medical examination at a health care facility 06/21/2013  . Hyperlipidemia with target LDL less than 70 06/20/2013  . Benign prostatic hyperplasia (BPH) with straining on urination 06/20/2013  . Brain atrophy 06/20/2013  . OSA (obstructive sleep  apnea) 06/20/2013  . Depression with anxiety 06/20/2013  . 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 12/27/2011  . Hypertension     Past Surgical History:  Procedure Laterality Date  . BLADDER NECK RECONSTRUCTION  02/03/2012   Procedure: BLADDER NECK REPAIR;  Surgeon: Adin Hector, MD;  Location: [facility] ORS;  Service: General;;  . COLONOSCOPY  02/2013   polyp removal(mult.)  . KNEE SURGERY  2004   left  . POLYPECTOMY    . PROCTOSCOPY  02/03/2012   Procedure: PROCTOSCOPY;  Surgeon: Adin Hector, MD;  Location: [facility] ORS;  Service: General;;  . STOMACH SURGERY  02/2012       Home Medications    Prior to Admission medications   Medication Sig Start Date End Date Taking? Authorizing Provider  Alcohol Swabs (B-D SINGLE USE SWABS REGULAR) PADS Use as directed Dx E11.9 07/16/15   Janith Lima, MD  Blood Glucose Monitoring Suppl (TRUE METRIX METER) W/DEVICE KIT Use as directed  Dx E11.9 07/16/15   Janith Lima, MD  glucose blood (TRUE METRIX BLOOD GLUCOSE TEST) test strip 1 each by Other route 2 (two) times daily. Use to check blood sugars twice a day Dx E11.9 08/20/16   Patrecia Pour, MD  HYDROcodone-acetaminophen (NORCO/VICODIN) 5-325 MG tablet Take 1 tablet by mouth every 6 (six) hours as needed for moderate  pain. 08/31/16   Dalia Heading, PA-C  Insulin Glargine (LANTUS SOLOSTAR) 100 UNIT/ML Solostar Pen Inject 30 units every night at bedtime 09/23/16   Janith Lima, MD  Insulin Pen Needle 31G X 5 MM MISC Administer lantus once nightly 08/20/16   Patrecia Pour, MD  lisinopril (PRINIVIL,ZESTRIL) 5 MG tablet Take 1 tablet (5 mg total) by mouth daily. 08/21/16   Patrecia Pour, MD  Multiple Vitamin (MULTIVITAMIN) tablet Take 1 tablet by mouth daily.    Historical Provider, MD  rivaroxaban (XARELTO) 20 MG TABS tablet Take 1 tablet (20 mg total) by mouth daily with supper. 07/30/16   Janith Lima, MD  simvastatin (ZOCOR)  40 MG tablet Take 1 tablet (40 mg total) by mouth every evening. 07/14/15   Janith Lima, MD  simvastatin (ZOCOR) 40 MG tablet TAKE 1 TABLET(40 MG) BY MOUTH EVERY EVENING 09/13/16   Janith Lima, MD  tamsulosin (FLOMAX) 0.4 MG CAPS capsule Take 0.4 mg by mouth.    Historical Provider, MD  TRUEPLUS LANCETS 33G MISC Use to help check blood sugars twice a day Dx E11.9 07/16/15   Janith Lima, MD    Family History Family History  Problem Relation Age of Onset  . Hypertension Mother   . Diabetes Mother   . Cancer Mother     ? location  . Colon cancer Father 53  . Colon polyps Neg Hx     Social History Social History  Substance Use Topics  . Smoking status: Never Smoker  . Smokeless tobacco: Never Used  . Alcohol use 0.0 oz/week     Comment: once every two months     Allergies   Metformin and related and Codeine   Review of Systems Review of Systems ROS reviewed and all are negative for acute change except as noted in the HPI.  Physical Exam Updated Vital Signs BP (!) 84/67   Pulse (!) 133   Temp 99.4 F (37.4 C) (Oral)   SpO2 100%   Physical Exam  Constitutional: He is oriented to person, place, and time. Vital signs are normal. He appears well-developed and well-nourished. No distress.  NAD  HENT:  Head: Normocephalic and atraumatic.  Right Ear: Hearing, tympanic membrane, external ear and ear canal normal.  Left Ear: Hearing, tympanic membrane, external ear and ear canal normal.  Nose: Nose normal.  Mouth/Throat: Uvula is midline, oropharynx is clear and moist and mucous membranes are normal. No trismus in the jaw. No oropharyngeal exudate, posterior oropharyngeal erythema or tonsillar abscesses.  Eyes: Conjunctivae and EOM are normal. Pupils are equal, round, and reactive to light.  Neck: Normal range of motion. Neck supple. No tracheal deviation present.  Cardiovascular: Regular rhythm, S1 normal, S2 normal, normal heart sounds, intact distal pulses and  normal pulses.  Tachycardia present.   Hypotensive 78-29 systolic  Pulmonary/Chest: Effort normal and breath sounds normal. No respiratory distress. He has no decreased breath sounds. He has no wheezes. He has no rhonchi. He has no rales.  Abdominal: Normal appearance and bowel sounds are normal. He exhibits distension. He exhibits no ascites. There is tenderness in the suprapubic area and left lower quadrant. There is no rigidity, no rebound, no guarding, no CVA tenderness, no tenderness at McBurney's point and negative Murphy's sign.  Abdomen soft  Musculoskeletal: Normal range of motion.  Neurological: He is alert and oriented to person, place, and time.  Skin: Skin is warm and dry.  Psychiatric: He has a normal mood  and affect. His speech is normal and behavior is normal. Thought content normal.  Nursing note and vitals reviewed.  ED Treatments / Results  Labs (all labs ordered are listed, but only abnormal results are displayed) Labs Reviewed  CBC - Abnormal; Notable for the following:       Result Value   WBC 19.6 (*)    RBC 3.88 (*)    Hemoglobin 11.2 (*)    HCT 34.2 (*)    All other components within normal limits  COMPREHENSIVE METABOLIC PANEL - Abnormal; Notable for the following:    Chloride 100 (*)    CO2 21 (*)    Glucose, Bld 197 (*)    Creatinine, Ser 1.41 (*)    Total Protein 8.5 (*)    Albumin 2.9 (*)    GFR calc non Af Amer 49 (*)    GFR calc Af Amer 57 (*)    All other components within normal limits  URINALYSIS, ROUTINE W REFLEX MICROSCOPIC - Abnormal; Notable for the following:    Color, Urine AMBER (*)    APPearance CLOUDY (*)    Hgb urine dipstick LARGE (*)    Ketones, ur 20 (*)    Protein, ur 30 (*)    Nitrite POSITIVE (*)    Leukocytes, UA LARGE (*)    Bacteria, UA MANY (*)    All other components within normal limits  I-STAT CG4 LACTIC ACID, ED - Abnormal; Notable for the following:    Lactic Acid, Venous 4.05 (*)    All other components within  normal limits  I-STAT CG4 LACTIC ACID, ED - Abnormal; Notable for the following:    Lactic Acid, Venous 2.87 (*)    All other components within normal limits  CULTURE, BLOOD (ROUTINE X 2)  CULTURE, BLOOD (ROUTINE X 2)  URINE CULTURE  LACTIC ACID, PLASMA  I-STAT TROPOININ, ED    EKG  EKG Interpretation  Date/Time:  Sunday September 26 2016 18:12:28 EST Ventricular Rate:  133 PR Interval:  130 QRS Duration: 74 QT Interval:  306 QTC Calculation: 455 R Axis:   42 Text Interpretation:  Sinus tachycardia Nonspecific T wave abnormality Abnormal ECG Confirmed by KNOTT MD, DANIEL ([id number]) on 09/26/2016 7:01:26 PM       Radiology Dg Chest 2 View  Result Date: 09/26/2016 CLINICAL DATA:  Chest pain for several weeks EXAM: CHEST  2 VIEW COMPARISON:  08/17/2016 FINDINGS: The heart size and mediastinal contours are within normal limits. Both lungs are clear. The visualized skeletal structures are unremarkable. IMPRESSION: No active cardiopulmonary disease. Electronically Signed   By: Inez Catalina M.D.   On: 09/26/2016 20:32   Ct Abdomen Pelvis W Contrast  Result Date: 09/26/2016 CLINICAL DATA:  Mid abdominal pain radiating to his back x1 day. EXAM: CT ABDOMEN AND PELVIS WITH CONTRAST TECHNIQUE: Multidetector CT imaging of the abdomen and pelvis was performed using the standard protocol following bolus administration of intravenous contrast. CONTRAST:  19m ISOVUE-300 IOPAMIDOL (ISOVUE-300) INJECTION 61% COMPARISON:  08/31/2016 go FINDINGS: Lower chest: There is mild bilateral lower lobe bronchiectasis. Clearing of bibasilar atelectasis and/or pneumonia. Top-normal sized heart without pericardial nor pleural effusions. No pneumothoraces. Hepatobiliary: No space-occupying mass of the liver. Stable 6 mm hypodensity in the left hepatic lobe statistically consistent with a cyst or hemangioma. No intrahepatic ductal dilatation. Tiny dependent gallstones without secondary stoma signs: Cystitis. Pancreas:  No pancreatic mass. A few speckled calcifications along the pancreatic tail may be vascular in etiology or related to chronic pancreatitis.  No pancreatic ductal dilatation or surrounding inflammatory changes. Spleen: Normal in size without focal abnormality. Adrenals/Urinary Tract: Normal adrenal glands. Multiple hypodense lesions of both kidneys consistent with cysts remain unchanged. The largest are in the upper pole on the right measuring up to 5.7 cm and on the left 3.7 cm. No obstructive uropathy. No nephrolithiasis. No solid enhancing mass lesions. Mild circumferential thickening of the bladder is again noted which may be related to underdistention and/or cystitis. Stomach/Bowel: The stomach is contracted in appearance. There is moderate colonic stool burden without acute inflammation. Normal-appearing appendix. No small bowel obstruction. Scattered colonic diverticulosis. Vascular/Lymphatic: Aortic atherosclerosis. No enlarged abdominal or pelvic lymph nodes. Reproductive: Irregular enlarged prostate gland containing central zone calcifications are unchanged in appearance. This impresses upon the base of the bladder as before. Other: No free air or free fluid. Musculoskeletal: No acute nor suspicious osseous abnormality. Lower lumbar degenerative facet arthropathy. IMPRESSION: 1. Clearing of bibasilar atelectasis and/or pneumonia since prior exam. Bilateral lower lobe bronchiectasis is incidentally noted. 2. Stable bilateral renal hypodensities consistent with cysts. No obstructive uropathy. 3. Uncomplicated cholelithiasis. 4. Chronic mild circumferential thickening of the urinary bladder as before may be related to cystitis. 5. Stable prostatomegaly. 6. Lower lumbar facet arthropathy. 7. Moderate colonic stool burden. No bowel obstruction or acute inflammation. Electronically Signed   By: Ashley Royalty M.D.   On: 09/26/2016 21:39    Procedures Procedures (including critical care time) CRITICAL  CARE Performed by: Ozella Rocks  Total critical care time: 40 minutes  Critical care time was exclusive of separately billable procedures and treating other patients.  Critical care was necessary to treat or prevent imminent or life-threatening deterioration.  Critical care was time spent personally by me on the following activities: development of treatment plan with patient and/or surrogate as well as nursing, discussions with consultants, evaluation of patient's response to treatment, examination of patient, obtaining history from patient or surrogate, ordering and performing treatments and interventions, ordering and review of laboratory studies, ordering and review of radiographic studies, pulse oximetry and re-evaluation of patient's condition.  Medications Ordered in ED Medications  cefTRIAXone (ROCEPHIN) 2 g in dextrose 5 % 50 mL IVPB (not administered)  sodium chloride 0.9 % bolus 1,000 mL (1,000 mLs Intravenous New Bag/Given 09/26/16 1911)    And  sodium chloride 0.9 % bolus 1,000 mL (0 mLs Intravenous Stopped 09/26/16 2039)    And  sodium chloride 0.9 % bolus 500 mL (500 mLs Intravenous New Bag/Given 09/26/16 2101)  cefTRIAXone (ROCEPHIN) 2 g in dextrose 5 % 50 mL IVPB (0 g Intravenous Stopped 09/26/16 2030)  morphine 4 MG/ML injection 4 mg (4 mg Intravenous Given 09/26/16 2035)  iopamidol (ISOVUE-300) 61 % injection (100 mLs  Contrast Given 09/26/16 2045)   Initial Impression / Assessment and Plan / ED Course  I have reviewed the triage vital signs and the nursing notes.  Pertinent labs & imaging results that were available during my care of the patient were reviewed by me and considered in my medical decision making (see chart for details).  Clinical Course    Final Clinical Impressions(s) / ED Diagnoses  {I have reviewed and evaluated the relevant laboratory values. {I have reviewed and evaluated the relevant imaging studies. {I have interpreted the relevant EKG. {I have  reviewed the relevant previous healthcare records.  {I obtained HPI from historian. {Patient discussed with supervising physician.  ED Course:  Assessment: Pt is a 62yM with hx DVT, DM2 who presents with left flank  pain and chest pain x 1 week. Seen late November for same and Dx kidney infection and given Levaquin. On exam, pt in Nontoxic/nonseptic appearing. VS with tachycardia and hypotension 09-19C systolic. Afebrile. Lungs CTA. Heart RRR. Abdomen TTP suprapubic. Mild distention. iStat Lactate 4.05. WBC 19.6. Code Sepsis initiated. Made NPO. Given rocephin abx to cover urinary source. UTI shows infection. Suspect Pyelo. NS given as per Sepsis protocol with adequate response to fluids. No indication of septic shock. CXR unremarkable. CT Abd/Pelvis unremarkable for acute abnormalities.  Plan is to [place]. Repeat sepsis assessment completed.  Disposition/Plan:  Admit Pt acknowledges and agrees with plan  Supervising Physician Leo Grosser, MD  Final diagnoses:  Elevated lactic acid level  Urinary tract infection without hematuria, site unspecified    New Prescriptions New Prescriptions   No medications on file     Shary Decamp, PA-C 09/26/16 2205    Leo Grosser, MD 09/27/16 [phone]

## 2016-09-26 NOTE — ED Notes (Signed)
Given report to 6E, pt in stable condition

## 2016-09-26 NOTE — ED Notes (Signed)
Patient taken to XRAY

## 2016-09-26 NOTE — H&P (Signed)
History and Physical    ADONAY SCHEIER HTD:[id number] DOB: 1946/04/04 DOA: 09/26/2016  PCP: Scarlette Calico, MD   Patient coming from: Home  Chief Complaint: Left flank and suprapubic pain  HPI: ORMOND LAZO is a 70 y.o. male with medical history significant for insulin-dependent diabetes mellitus, history of DVT on Xarelto, hypertension, and BPH who presents to the emergency department with worsening pain in the left flank and lower abdomen, dysuria, and malaise. Patient reports that he was treated in the hospital for pyelonephritis approximately 5 weeks ago and made excellent improvement, but continued to have some residual pain in the left flank. Over the past several days, this pain is worsened significantly and has become accompanied by suprapubic pain, dysuria, and malaise. Patient has not noted a fever. He endorses a couple episodes of nonbloody and nonbilious vomiting, but denies diarrhea, melena, or hematochezia. He reports completing the oral antibiotics that he was discharged with last month after being treated for pyelonephritis in the hospital with blood culture positive for Escherichia coli. He describes his pain as severe, localized to the left flank and suprapubic regions, sharp, worse with movement or palpation, and with no alleviating factors identified. Patient denies any significant dyspnea or cough.  ED Course: Upon arrival to the ED, patient is found to be afebrile, saturating adequately on room air, tachycardic in the 130s, hypotensive to 84/67, and with normal respirations. EKG features a sinus tachycardia with rate 133 and nonspecific T-wave abnormality. Chest x-ray is negative for acute cardiopulmonary disease. Chemistry panel features a serum creatinine 1.41, up from an apparent baseline of 0.8. CBC is notable for a leukocytosis to 19,600 and a normocytic anemia with hemoglobin of 11.2, down from priors in the 12 range. Urinalysis is highly suggestive of infection and  demonstrates large hemoglobin as well. Lactic acid returned elevated to 4.05 and troponin is undetectable. CT of the abdomen and pelvis demonstrates clearing of bibasilar opacities since the prior study, stable renal densities, likely cysts, and chronic mild circumferential bladder thickening consistent with cystitis. There is no bowel obstruction on the CT. Patient was treated with 30 cc/kg normal saline bolus, 2 g of Rocephin, and 4 mg morphine. Patient responded well to the fluid bolus and his tachycardia and hypotension has resolved. Repeat lactic acid has improved to 2.87. Patient will be admitted to the telemetry unit for ongoing evaluation and management of sepsis suspected secondary to UTI.  Review of Systems:  All other systems reviewed and apart from HPI, are negative.  Past Medical History:  Diagnosis Date  . Clotting disorder (HCC)    DVT  . Diabetes mellitus    type II  . Diverticulosis   . DVT (deep venous thrombosis) ([place])   . Glaucoma    no eye drops   . Hepatic cyst   . Hypercholesterolemia   . Hypertension   . Myocardial infarction    very mild long ago   . Renal cyst   . Sleep apnea   . Tubulovillous adenoma     Past Surgical History:  Procedure Laterality Date  . BLADDER NECK RECONSTRUCTION  02/03/2012   Procedure: BLADDER NECK REPAIR;  Surgeon: Adin Hector, MD;  Location: [facility];  Service: General;;  . COLONOSCOPY  02/2013   polyp removal(mult.)  . KNEE SURGERY  2004   left  . POLYPECTOMY    . PROCTOSCOPY  02/03/2012   Procedure: PROCTOSCOPY;  Surgeon: Adin Hector, MD;  Location: [facility];  Service: General;;  .  STOMACH SURGERY  02/2012     reports that he has never smoked. He has never used smokeless tobacco. He reports that he drinks alcohol. He reports that he does not use drugs.  Allergies  Allergen Reactions  . Metformin And Related Diarrhea  . Codeine Rash    All over the body    Family History  Problem Relation Age of Onset  . Hypertension  Mother   . Diabetes Mother   . Cancer Mother     ? location  . Colon cancer Father 56  . Colon polyps Neg Hx      Prior to Admission medications   Medication Sig Start Date End Date Taking? Authorizing Provider  HYDROcodone-acetaminophen (NORCO/VICODIN) 5-325 MG tablet Take 1 tablet by mouth every 6 (six) hours as needed for moderate pain. 08/31/16  Yes Christopher Lawyer, PA-C  ibuprofen (ADVIL,MOTRIN) 200 MG tablet Take 200 mg by mouth daily.   Yes Historical Provider, MD  Insulin Glargine (LANTUS SOLOSTAR) 100 UNIT/ML Solostar Pen Inject 30 units every night at bedtime Patient taking differently: Inject 30 Units into the skin at bedtime.  09/23/16  Yes Janith Lima, MD  lisinopril (PRINIVIL,ZESTRIL) 5 MG tablet Take 1 tablet (5 mg total) by mouth daily. 08/21/16  Yes Patrecia Pour, MD  Multiple Vitamin (MULTIVITAMIN WITH MINERALS) TABS tablet Take 1 tablet by mouth daily.   Yes Historical Provider, MD  rivaroxaban (XARELTO) 20 MG TABS tablet Take 1 tablet (20 mg total) by mouth daily with supper. Patient taking differently: Take 20 mg by mouth daily with breakfast.  07/30/16  Yes Janith Lima, MD  simvastatin (ZOCOR) 40 MG tablet TAKE 1 TABLET(40 MG) BY MOUTH EVERY EVENING Patient taking differently: TAKE 1 TABLET(40 MG) BY MOUTH EVERY MORNING 09/13/16  Yes Janith Lima, MD  tamsulosin (FLOMAX) 0.4 MG CAPS capsule Take 0.4 mg by mouth daily after breakfast.    Yes Historical Provider, MD  Alcohol Swabs (B-D SINGLE USE SWABS REGULAR) PADS Use as directed Dx E11.9 07/16/15   Janith Lima, MD  Blood Glucose Monitoring Suppl (TRUE METRIX METER) W/DEVICE KIT Use as directed  Dx E11.9 07/16/15   Janith Lima, MD  glucose blood (TRUE METRIX BLOOD GLUCOSE TEST) test strip 1 each by Other route 2 (two) times daily. Use to check blood sugars twice a day Dx E11.9 08/20/16   Patrecia Pour, MD  Insulin Pen Needle 31G X 5 MM MISC Administer lantus once nightly 08/20/16   Patrecia Pour, MD    simvastatin (ZOCOR) 40 MG tablet Take 1 tablet (40 mg total) by mouth every evening. Patient not taking: Reported on 09/26/2016 07/14/15   Janith Lima, MD  TRUEPLUS LANCETS 33G MISC Use to help check blood sugars twice a day Dx E11.9 07/16/15   Janith Lima, MD    Physical Exam: Vitals:   09/26/16 2015 09/26/16 2030 09/26/16 2115 09/26/16 2215  BP: 116/81 118/78 129/84 128/83  Pulse: 102 104 97 97  Resp: '16  18 18  ' Temp:      TempSrc:      SpO2: 100% 100% 100% 100%     Constitutional: No respiratory distress, no diaphoresis, appears uncomfortable Eyes: PERTLA, lids and conjunctivae normal ENMT: Mucous membranes are dry. Posterior pharynx clear of any exudate or lesions.   Neck: normal, supple, no masses, no thyromegaly Respiratory: clear to auscultation bilaterally, no wheezing, no crackles. Normal respiratory effort.   Cardiovascular: Rate ~110 and regular. No extremity edema.  No significant JVD. Abdomen: No distension, tender in left flank and suprapubic region without rebound pain or guarding. No masses palpated. Bowel sounds normal.  Musculoskeletal: no clubbing / cyanosis. No joint deformity upper and lower extremities. Normal muscle tone.  Skin: no significant rashes, lesions, ulcers. Warm, dry, well-perfused. Neurologic: CN 2-12 grossly intact. Sensation intact, DTR normal. Strength 5/5 in all 4 limbs.  Psychiatric: Alert and oriented x 3. Cooperative. Odd affect.     Labs on Admission: I have personally reviewed following labs and imaging studies  CBC:  Recent Labs Lab 09/26/16 1835  WBC 19.6*  HGB 11.2*  HCT 34.2*  MCV 88.1  PLT 829   Basic Metabolic Panel:  Recent Labs Lab 09/26/16 1835  NA 135  K 4.0  CL 100*  CO2 21*  GLUCOSE 197*  BUN 15  CREATININE 1.41*  CALCIUM 9.2   GFR: Estimated Creatinine Clearance: 53.5 mL/min (by C-G formula based on SCr of 1.41 mg/dL (H)). Liver Function Tests:  Recent Labs Lab 09/26/16 1835  AST 32  ALT  21  ALKPHOS 74  BILITOT 0.8  PROT 8.5*  ALBUMIN 2.9*   No results for input(s): LIPASE, AMYLASE in the last 168 hours. No results for input(s): AMMONIA in the last 168 hours. Coagulation Profile: No results for input(s): INR, PROTIME in the last 168 hours. Cardiac Enzymes: No results for input(s): CKTOTAL, CKMB, CKMBINDEX, TROPONINI in the last 168 hours. BNP (last 3 results) No results for input(s): PROBNP in the last 8760 hours. HbA1C: No results for input(s): HGBA1C in the last 72 hours. CBG: No results for input(s): GLUCAP in the last 168 hours. Lipid Profile: No results for input(s): CHOL, HDL, LDLCALC, TRIG, CHOLHDL, LDLDIRECT in the last 72 hours. Thyroid Function Tests: No results for input(s): TSH, T4TOTAL, FREET4, T3FREE, THYROIDAB in the last 72 hours. Anemia Panel: No results for input(s): VITAMINB12, FOLATE, FERRITIN, TIBC, IRON, RETICCTPCT in the last 72 hours. Urine analysis:    Component Value Date/Time   COLORURINE AMBER (A) 09/26/2016 1835   APPEARANCEUR CLOUDY (A) 09/26/2016 1835   LABSPEC 1.018 09/26/2016 1835   PHURINE 5.0 09/26/2016 1835   GLUCOSEU NEGATIVE 09/26/2016 1835   GLUCOSEU NEGATIVE 09/08/2016 1513   HGBUR LARGE (A) 09/26/2016 [place] 09/26/2016 1835   BILIRUBINUR negative 08/17/2016 1359   KETONESUR 20 (A) 09/26/2016 1835   PROTEINUR 30 (A) 09/26/2016 1835   UROBILINOGEN 0.2 09/08/2016 1513   NITRITE POSITIVE (A) 09/26/2016 1835   LEUKOCYTESUR LARGE (A) 09/26/2016 1835   Sepsis Labs: '@LABRCNTIP' (procalcitonin:4,lacticidven:4) )No results found for this or any previous visit (from the past 240 hour(s)).   Radiological Exams on Admission: Dg Chest 2 View  Result Date: 09/26/2016 CLINICAL DATA:  Chest pain for several weeks EXAM: CHEST  2 VIEW COMPARISON:  08/17/2016 FINDINGS: The heart size and mediastinal contours are within normal limits. Both lungs are clear. The visualized skeletal structures are unremarkable.  IMPRESSION: No active cardiopulmonary disease. Electronically Signed   By: Inez Catalina M.D.   On: 09/26/2016 20:32   Ct Abdomen Pelvis W Contrast  Result Date: 09/26/2016 CLINICAL DATA:  Mid abdominal pain radiating to his back x1 day. EXAM: CT ABDOMEN AND PELVIS WITH CONTRAST TECHNIQUE: Multidetector CT imaging of the abdomen and pelvis was performed using the standard protocol following bolus administration of intravenous contrast. CONTRAST:  130m ISOVUE-300 IOPAMIDOL (ISOVUE-300) INJECTION 61% COMPARISON:  08/31/2016 go FINDINGS: Lower chest: There is mild bilateral lower lobe bronchiectasis. Clearing of bibasilar atelectasis and/or  pneumonia. Top-normal sized heart without pericardial nor pleural effusions. No pneumothoraces. Hepatobiliary: No space-occupying mass of the liver. Stable 6 mm hypodensity in the left hepatic lobe statistically consistent with a cyst or hemangioma. No intrahepatic ductal dilatation. Tiny dependent gallstones without secondary stoma signs: Cystitis. Pancreas: No pancreatic mass. A few speckled calcifications along the pancreatic tail may be vascular in etiology or related to chronic pancreatitis. No pancreatic ductal dilatation or surrounding inflammatory changes. Spleen: Normal in size without focal abnormality. Adrenals/Urinary Tract: Normal adrenal glands. Multiple hypodense lesions of both kidneys consistent with cysts remain unchanged. The largest are in the upper pole on the right measuring up to 5.7 cm and on the left 3.7 cm. No obstructive uropathy. No nephrolithiasis. No solid enhancing mass lesions. Mild circumferential thickening of the bladder is again noted which may be related to underdistention and/or cystitis. Stomach/Bowel: The stomach is contracted in appearance. There is moderate colonic stool burden without acute inflammation. Normal-appearing appendix. No small bowel obstruction. Scattered colonic diverticulosis. Vascular/Lymphatic: Aortic atherosclerosis.  No enlarged abdominal or pelvic lymph nodes. Reproductive: Irregular enlarged prostate gland containing central zone calcifications are unchanged in appearance. This impresses upon the base of the bladder as before. Other: No free air or free fluid. Musculoskeletal: No acute nor suspicious osseous abnormality. Lower lumbar degenerative facet arthropathy. IMPRESSION: 1. Clearing of bibasilar atelectasis and/or pneumonia since prior exam. Bilateral lower lobe bronchiectasis is incidentally noted. 2. Stable bilateral renal hypodensities consistent with cysts. No obstructive uropathy. 3. Uncomplicated cholelithiasis. 4. Chronic mild circumferential thickening of the urinary bladder as before may be related to cystitis. 5. Stable prostatomegaly. 6. Lower lumbar facet arthropathy. 7. Moderate colonic stool burden. No bowel obstruction or acute inflammation. Electronically Signed   By: Ashley Royalty M.D.   On: 09/26/2016 21:39    EKG: Independently reviewed. Sinus tachycardia (rate 133), non-specific T-wave abnormality  Assessment/Plan  1. Sepsis secondary to UTI  - Pt meets sepsis criteria on admission with leukocytosis, tachycardia, elevated lactate, AKI, and apparent UTI - CXR clear, CT abdomen notable for bladder wall thickening; UA suggests infection in this clinical setting  - 30 cc/kg NS bolus given in ED and empiric Rocephin initiated  - Blood and urine cultures ordered on admission; trend lactate   - Continue empiric abx with cefepime while awaiting culture data and monitoring clinical response to therapy  - Pt has made good improvement with initial treatments; HR and BP have normalized, lactic acid normalized   2. Acute kidney injury  - SCr is 1.41 on admission, up from apparent baseline of 0.8 - Likely a prerenal azotemia in setting of sepsis with soft BP, exacerbated by Advil and lisinopril use  - No evidence for urinary obstruction on abd CT  - Anticipate improvement with fluid-resuscitation    - Pt was treated with 30 cc/kg NS in ED, will be continued on NS infusion overnight  - Hold lisinopril and NSAID, repeat chem panel in am   3. Insulin-dependent DM  - A1c persistently above 12%  - Managed with Lantus 30 units qHS at home  - Check CBG with meals and qHS  - Continue Lantus 30 units qHS with moderate-intensity SSI correctional   4. Hx of DVT - No evidence for acute VTE  - Continue current management with Xarelto    5. OSA  - CPAP qHS as tolerated   DVT prophylaxis: Xarelto   Code Status: Full  Family Communication: Daughter updated at bedside at patient's request Disposition Plan: Admit to telemetry  Consults called: None Admission status: Inpatient    Vianne Bulls, MD [facility] Pager [phone]  If 7PM-7AM, please contact night-coverage [email or web address] Password [facility]  09/26/2016, 10:41 PM

## 2016-09-26 NOTE — Progress Notes (Signed)
Pharmacy Antibiotic Note  69 y.o. male with likely UTI for Cefepime.   Rocephin 2 g IV given in ED earlier tonight.  Plan: Cefepime 1 g IV q12h   Temp (24hrs), Avg:99.4 F (37.4 C), Min:99.4 F (37.4 C), Max:99.4 F (37.4 C)   Recent Labs Lab 09/26/16 1835 09/26/16 1909 09/26/16 2151 09/26/16 2215  WBC 19.6*  --   --   --   CREATININE 1.41*  --   --   --   LATICACIDVEN  --  4.05* 2.87* 1.7    Estimated Creatinine Clearance: 53.5 mL/min (by C-G formula based on SCr of 1.41 mg/dL (H)).    Allergies  Allergen Reactions  . Metformin And Related Diarrhea  . Codeine Rash    All over the body    Phillis Knack, PharmD, BCPS  09/26/2016 11:05 PM

## 2016-09-26 NOTE — ED Triage Notes (Signed)
Pt report several weeks of chest pain and left flank pain.  Had UTI end of November and has been in pain since.

## 2016-09-26 NOTE — ED Notes (Signed)
Pt returned from CT °

## 2016-09-26 NOTE — ED Notes (Signed)
Patient transported to CT 

## 2016-09-27 LAB — BLOOD CULTURE ID PANEL (REFLEXED)
ACINETOBACTER BAUMANNII: NOT DETECTED
CANDIDA ALBICANS: NOT DETECTED
CANDIDA GLABRATA: NOT DETECTED
CANDIDA KRUSEI: NOT DETECTED
CANDIDA PARAPSILOSIS: NOT DETECTED
CANDIDA TROPICALIS: NOT DETECTED
Carbapenem resistance: NOT DETECTED
ENTEROBACTER CLOACAE COMPLEX: NOT DETECTED
ESCHERICHIA COLI: DETECTED — AB
Enterobacteriaceae species: DETECTED — AB
Enterococcus species: NOT DETECTED
Haemophilus influenzae: NOT DETECTED
KLEBSIELLA OXYTOCA: NOT DETECTED
KLEBSIELLA PNEUMONIAE: NOT DETECTED
Listeria monocytogenes: NOT DETECTED
Neisseria meningitidis: NOT DETECTED
PROTEUS SPECIES: NOT DETECTED
Pseudomonas aeruginosa: NOT DETECTED
STREPTOCOCCUS PYOGENES: NOT DETECTED
Serratia marcescens: NOT DETECTED
Staphylococcus aureus (BCID): NOT DETECTED
Staphylococcus species: NOT DETECTED
Streptococcus agalactiae: NOT DETECTED
Streptococcus pneumoniae: NOT DETECTED
Streptococcus species: NOT DETECTED

## 2016-09-27 LAB — BASIC METABOLIC PANEL
Anion gap: 5 (ref 5–15)
BUN: 17 mg/dL (ref 6–20)
CALCIUM: 8.1 mg/dL — AB (ref 8.9–10.3)
CO2: 25 mmol/L (ref 22–32)
CREATININE: 0.94 mg/dL (ref 0.61–1.24)
Chloride: 107 mmol/L (ref 101–111)
GFR calc non Af Amer: >60 mL/min (ref >60)
Glucose, Bld: 161 mg/dL — ABNORMAL HIGH (ref 65–99)
Potassium: 4.2 mmol/L (ref 3.5–5.1)
Sodium: 137 mmol/L (ref 135–145)

## 2016-09-27 LAB — CBC WITH DIFFERENTIAL/PLATELET
BASOS PCT: 0 %
Basophils Absolute: 0 10*3/uL (ref 0.0–0.1)
EOS ABS: 0 10*3/uL (ref 0.0–0.7)
Eosinophils Relative: 0 %
HEMATOCRIT: 27.8 % — AB (ref 39.0–52.0)
Hemoglobin: 9.2 g/dL — ABNORMAL LOW (ref 13.0–17.0)
Lymphocytes Relative: 10 %
Lymphs Abs: 1.7 10*3/uL (ref 0.7–4.0)
MCH: 29 pg (ref 26.0–34.0)
MCHC: 33.1 g/dL (ref 30.0–36.0)
MCV: 87.7 fL (ref 78.0–100.0)
MONO ABS: 1 10*3/uL (ref 0.1–1.0)
MONOS PCT: 6 %
NEUTROS ABS: 14.6 10*3/uL — AB (ref 1.7–7.7)
Neutrophils Relative %: 84 %
Platelets: 239 10*3/uL (ref 150–400)
RBC: 3.17 MIL/uL — ABNORMAL LOW (ref 4.22–5.81)
RDW: 13 % (ref 11.5–15.5)
WBC: 17.3 10*3/uL — ABNORMAL HIGH (ref 4.0–10.5)

## 2016-09-27 LAB — GLUCOSE, CAPILLARY
GLUCOSE-CAPILLARY: 118 mg/dL — AB (ref 65–99)
GLUCOSE-CAPILLARY: 124 mg/dL — AB (ref 65–99)
GLUCOSE-CAPILLARY: 105 mg/dL — AB (ref 65–99)
GLUCOSE-CAPILLARY: 161 mg/dL — AB (ref 65–99)
Glucose-Capillary: 148 mg/dL — ABNORMAL HIGH (ref 65–99)

## 2016-09-27 LAB — LACTIC ACID, PLASMA
LACTIC ACID, VENOUS: 1 mmol/L (ref 0.5–1.9)
Lactic Acid, Venous: 2.6 mmol/L (ref 0.5–1.9)

## 2016-09-27 MED ORDER — METHOCARBAMOL 500 MG PO TABS
500.0000 mg | ORAL_TABLET | Freq: Three times a day (TID) | ORAL | Status: DC
  Administered 2016-09-27 – 2016-10-06 (×27): 500 mg via ORAL
  Filled 2016-09-27 (×27): qty 1

## 2016-09-27 MED ORDER — ACETAMINOPHEN 325 MG PO TABS: 650.0000 mg | ORAL_TABLET | Freq: Four times a day (QID) | ORAL | Status: DC | PRN

## 2016-09-27 MED ORDER — POLYETHYLENE GLYCOL 3350 17 G PO PACK
17.0000 g | PACK | Freq: Every day | ORAL | Status: DC
  Administered 2016-09-27 – 2016-10-03 (×7): 17 g via ORAL
  Filled 2016-09-27 (×8): qty 1

## 2016-09-27 MED ORDER — CEFTRIAXONE SODIUM 2 G IJ SOLR
2.0000 g | INTRAMUSCULAR | Status: DC
  Administered 2016-09-27 – 2016-09-30 (×4): 2 g via INTRAVENOUS
  Filled 2016-09-27 (×5): qty 2

## 2016-09-27 MED ORDER — SODIUM CHLORIDE 0.9 % IV SOLN
INTRAVENOUS | Status: DC
  Administered 2016-09-27 – 2016-09-28 (×4): via INTRAVENOUS

## 2016-09-27 NOTE — Progress Notes (Signed)
[facility] Progress Note  Patient: Charles Parker [id number]   PCP: Scarlette Calico, MD DOB: 02-18-1946   DOA: 09/26/2016   DOS: 09/27/2016   Date of Service: the patient was seen and examined on 09/27/2016  Brief hospital course: Pt. with PMH of Type II DM, DVT, HTN, BPH; admitted on 09/26/2016, with complaint of fever and chills with left flank pain, was found to have Escherichia coli bacteremia with sepsis. Currently further plan is continue IV antibiotics.  Assessment and Plan: 1. Sepsis due to urinary tract infection (HCC) Escherichia coli bacteremia with sepsis. Blood cultures are positive for Escherichia coli, urine culture is also positive for Escherichia coli. Patient is feeling somewhat better. Continues to have left flank pain. Changing IV cefepime to ceftriaxone. Monitor.  2. Acute kidney injury  - SCr is 1.41 on admission, up from apparent baseline of 0.8 - Likely a prerenal azotemia in setting of sepsis with soft BP, exacerbated by Advil and lisinopril use  - No evidence for urinary obstruction on abd CT  - improvement with fluid-resuscitation  - Pt was treated with 30 cc/kg NS in ED, will be continued on NS infusion overnight  - Hold lisinopril and NSAID, repeat chem panel in am   3. Insulin-dependent DM  - A1c persistently above 12%  - Managed with Lantus 30 units qHS at home  - Check CBG with meals and qHS  - Continue Lantus 30 units qHS with moderate-intensity SSI correctional   4. Hx of DVT - No evidence for acute VTE  - Continue current management with Xarelto    5. OSA  - CPAP qHS as tolerated  Pain management: Added. Robaxin Activity: May need physical therapy Bowel regimen: last BM prior to admission Diet: Cardiac and diabetic diet DVT Prophylaxis: therapeutic anticoagulation.  Advance goals of care discussion: Full code  Family Communication: no family was present at bedside, at the time of interview.  Disposition:  Discharge to  home. Expected discharge date: 10/01/2016,   Consultants: None Procedures: None  Antibiotics: Anti-infectives    Start     Dose/Rate Route Frequency Ordered Stop   09/27/16 1800  cefTRIAXone (ROCEPHIN) 2 g in dextrose 5 % 50 mL IVPB  Status:  Discontinued     2 g 100 mL/hr over 30 Minutes Intravenous Every 24 hours 09/26/16 1857 09/26/16 2234   09/27/16 1100  cefTRIAXone (ROCEPHIN) 2 g in dextrose 5 % 50 mL IVPB     2 g 100 mL/hr over 30 Minutes Intravenous Every 24 hours 09/27/16 1024     09/26/16 2359  ceFEPIme (MAXIPIME) 1 g in dextrose 5 % 50 mL IVPB  Status:  Discontinued     1 g 100 mL/hr over 30 Minutes Intravenous Every 12 hours 09/26/16 2307 09/27/16 1008   09/26/16 2245  ceFEPIme (MAXIPIME) 2 g in dextrose 5 % 50 mL IVPB  Status:  Discontinued     2 g 100 mL/hr over 30 Minutes Intravenous  Once 09/26/16 2233 09/26/16 2306   09/26/16 1845  cefTRIAXone (ROCEPHIN) 2 g in dextrose 5 % 50 mL IVPB     2 g 100 mL/hr over 30 Minutes Intravenous  Once 09/26/16 1835 09/26/16 2030        Subjective: Continues to have the left flank pain. No nausea and vomiting. Feeling somewhat better.  Objective: Physical Exam: Vitals:   09/26/16 2328 09/27/16 0600 09/27/16 0831 09/27/16 1651  BP: 130/85 115/73 113/78 102/72  Pulse: 96 78 82 86  Resp: 19 18  17 18  Temp: 98.2 F (36.8 C) 98.5 F (36.9 C) 97.8 F (36.6 C) 98 F (36.7 C)  TempSrc: Oral Oral Oral Oral  SpO2: 100% 100% 100% 99%    Intake/Output Summary (Last 24 hours) at 09/27/16 1751 Last data filed at 09/27/16 1655  Gross per 24 hour  Intake             1760 ml  Output              200 ml  Net             1560 ml   There were no vitals filed for this visit.  General: Alert, Awake and Oriented to Time, Place and Person. Appear in mild distress, affect appropriate Eyes: PERRL, Conjunctiva normal ENT: Oral Mucosa clear moist. Neck: no JVD, no Abnormal Mass Or lumps Cardiovascular: S1 and S2 Present, no  Murmur, Respiratory: Bilateral Air entry equal and Decreased, no use of accessory muscle, Clear to Auscultation, no Crackles, no wheezes Abdomen: Bowel Sound present, Soft and CVA tenderness Skin: no redness, no Rash, no induration Extremities: no Pedal edema, no calf tenderness Neurologic: Grossly no focal neuro deficit. Bilaterally Equal motor strength  Data Reviewed: CBC:  Recent Labs Lab 09/26/16 1835 09/27/16 0355  WBC 19.6* 17.3*  NEUTROABS  --  14.6*  HGB 11.2* 9.2*  HCT 34.2* 27.8*  MCV 88.1 87.7  PLT 307 [id number]   Basic Metabolic Panel:  Recent Labs Lab 09/26/16 1835 09/27/16 0355  NA 135 137  K 4.0 4.2  CL 100* 107  CO2 21* 25  GLUCOSE 197* 161*  BUN 15 17  CREATININE 1.41* 0.94  CALCIUM 9.2 8.1*    Liver Function Tests:  Recent Labs Lab 09/26/16 1835  AST 32  ALT 21  ALKPHOS 74  BILITOT 0.8  PROT 8.5*  ALBUMIN 2.9*   No results for input(s): LIPASE, AMYLASE in the last 168 hours. No results for input(s): AMMONIA in the last 168 hours. Coagulation Profile:  Recent Labs Lab 09/26/16 2228  INR 1.37   Cardiac Enzymes: No results for input(s): CKTOTAL, CKMB, CKMBINDEX, TROPONINI in the last 168 hours. BNP (last 3 results) No results for input(s): PROBNP in the last 8760 hours.  CBG:  Recent Labs Lab 09/27/16 0054 09/27/16 0746 09/27/16 1205 09/27/16 1629  GLUCAP 148* 118* 124* 105*    Studies: Dg Chest 2 View  Result Date: 09/26/2016 CLINICAL DATA:  Chest pain for several weeks EXAM: CHEST  2 VIEW COMPARISON:  08/17/2016 FINDINGS: The heart size and mediastinal contours are within normal limits. Both lungs are clear. The visualized skeletal structures are unremarkable. IMPRESSION: No active cardiopulmonary disease. Electronically Signed   By: Inez Catalina M.D.   On: 09/26/2016 20:32   Ct Abdomen Pelvis W Contrast  Result Date: 09/26/2016 CLINICAL DATA:  Mid abdominal pain radiating to his back x1 day. EXAM: CT ABDOMEN AND PELVIS  WITH CONTRAST TECHNIQUE: Multidetector CT imaging of the abdomen and pelvis was performed using the standard protocol following bolus administration of intravenous contrast. CONTRAST:  134mL ISOVUE-300 IOPAMIDOL (ISOVUE-300) INJECTION 61% COMPARISON:  08/31/2016 go FINDINGS: Lower chest: There is mild bilateral lower lobe bronchiectasis. Clearing of bibasilar atelectasis and/or pneumonia. Top-normal sized heart without pericardial nor pleural effusions. No pneumothoraces. Hepatobiliary: No space-occupying mass of the liver. Stable 6 mm hypodensity in the left hepatic lobe statistically consistent with a cyst or hemangioma. No intrahepatic ductal dilatation. Tiny dependent gallstones without secondary stoma signs: Cystitis. Pancreas: No pancreatic  mass. A few speckled calcifications along the pancreatic tail may be vascular in etiology or related to chronic pancreatitis. No pancreatic ductal dilatation or surrounding inflammatory changes. Spleen: Normal in size without focal abnormality. Adrenals/Urinary Tract: Normal adrenal glands. Multiple hypodense lesions of both kidneys consistent with cysts remain unchanged. The largest are in the upper pole on the right measuring up to 5.7 cm and on the left 3.7 cm. No obstructive uropathy. No nephrolithiasis. No solid enhancing mass lesions. Mild circumferential thickening of the bladder is again noted which may be related to underdistention and/or cystitis. Stomach/Bowel: The stomach is contracted in appearance. There is moderate colonic stool burden without acute inflammation. Normal-appearing appendix. No small bowel obstruction. Scattered colonic diverticulosis. Vascular/Lymphatic: Aortic atherosclerosis. No enlarged abdominal or pelvic lymph nodes. Reproductive: Irregular enlarged prostate gland containing central zone calcifications are unchanged in appearance. This impresses upon the base of the bladder as before. Other: No free air or free fluid. Musculoskeletal: No  acute nor suspicious osseous abnormality. Lower lumbar degenerative facet arthropathy. IMPRESSION: 1. Clearing of bibasilar atelectasis and/or pneumonia since prior exam. Bilateral lower lobe bronchiectasis is incidentally noted. 2. Stable bilateral renal hypodensities consistent with cysts. No obstructive uropathy. 3. Uncomplicated cholelithiasis. 4. Chronic mild circumferential thickening of the urinary bladder as before may be related to cystitis. 5. Stable prostatomegaly. 6. Lower lumbar facet arthropathy. 7. Moderate colonic stool burden. No bowel obstruction or acute inflammation. Electronically Signed   By: Ashley Royalty M.D.   On: 09/26/2016 21:39     Scheduled Meds: . cefTRIAXone (ROCEPHIN)  IV  2 g Intravenous Q24H  . insulin aspart  0-15 Units Subcutaneous TID WC  . insulin aspart  0-5 Units Subcutaneous QHS  . insulin glargine  30 Units Subcutaneous QHS  . methocarbamol  500 mg Oral TID  . multivitamin with minerals  1 tablet Oral Daily  . polyethylene glycol  17 g Oral Daily  . rivaroxaban  20 mg Oral Q breakfast  . simvastatin  40 mg Oral q1800  . sodium chloride flush  3 mL Intravenous Q12H  . tamsulosin  0.4 mg Oral Daily   Continuous Infusions: . sodium chloride 125 mL/hr at 09/27/16 1037   PRN Meds: acetaminophen, HYDROcodone-acetaminophen, morphine injection, ondansetron **OR** ondansetron (ZOFRAN) IV  Time spent: 30 minutes  Author: Berle Mull, MD [facility] Pager: [phone] 09/27/2016 5:51 PM  If 7PM-7AM, please contact night-coverage at [email or web address], password [facility]

## 2016-09-27 NOTE — Progress Notes (Addendum)
PHARMACY - PHYSICIAN COMMUNICATION CRITICAL VALUE ALERT - BLOOD CULTURE IDENTIFICATION (BCID)  Results for orders placed or performed during the hospital encounter of 09/26/16  Blood Culture ID Panel (Reflexed) (Collected: 09/26/2016  6:35 PM)  Result Value Ref Range   Enterococcus species NOT DETECTED NOT DETECTED   Listeria monocytogenes NOT DETECTED NOT DETECTED   Staphylococcus species NOT DETECTED NOT DETECTED   Staphylococcus aureus NOT DETECTED NOT DETECTED   Streptococcus species NOT DETECTED NOT DETECTED   Streptococcus agalactiae NOT DETECTED NOT DETECTED   Streptococcus pneumoniae NOT DETECTED NOT DETECTED   Streptococcus pyogenes NOT DETECTED NOT DETECTED   Acinetobacter baumannii NOT DETECTED NOT DETECTED   Enterobacteriaceae species DETECTED (A) NOT DETECTED   Enterobacter cloacae complex NOT DETECTED NOT DETECTED   Escherichia coli DETECTED (A) NOT DETECTED   Klebsiella oxytoca NOT DETECTED NOT DETECTED   Klebsiella pneumoniae NOT DETECTED NOT DETECTED   Proteus species NOT DETECTED NOT DETECTED   Serratia marcescens NOT DETECTED NOT DETECTED   Carbapenem resistance NOT DETECTED NOT DETECTED   Haemophilus influenzae NOT DETECTED NOT DETECTED   Neisseria meningitidis NOT DETECTED NOT DETECTED   Pseudomonas aeruginosa NOT DETECTED NOT DETECTED   Candida albicans NOT DETECTED NOT DETECTED   Candida glabrata NOT DETECTED NOT DETECTED   Candida krusei NOT DETECTED NOT DETECTED   Candida parapsilosis NOT DETECTED NOT DETECTED   Candida tropicalis NOT DETECTED NOT DETECTED    Name of physician (or Provider) Contacted: Dr. Posey Pronto (text)  Changes to prescribed antibiotics required: None, could narrow to Rocephin  Tad Moore 09/27/2016  8:20 AM   10:30 am:  Cefepime narrowed to Rocephin 2 grams iv Q 24 hours  Thank you Anette Guarneri, PharmD [phone]

## 2016-09-28 LAB — TROPONIN I
Troponin I: <0.03 ng/mL (ref <0.03)
Troponin I: 0.04 ng/mL (ref <0.03)

## 2016-09-28 LAB — CBC WITH DIFFERENTIAL/PLATELET
BASOS ABS: 0 10*3/uL (ref 0.0–0.1)
BASOS PCT: 0 %
EOS ABS: 0.1 10*3/uL (ref 0.0–0.7)
Eosinophils Relative: 1 %
HCT: 31.6 % — ABNORMAL LOW (ref 39.0–52.0)
HEMOGLOBIN: 10.1 g/dL — AB (ref 13.0–17.0)
LYMPHS ABS: 1.8 10*3/uL (ref 0.7–4.0)
Lymphocytes Relative: 18 %
MCH: 28.4 pg (ref 26.0–34.0)
MCHC: 32 g/dL (ref 30.0–36.0)
MCV: 88.8 fL (ref 78.0–100.0)
Monocytes Absolute: 0.8 10*3/uL (ref 0.1–1.0)
Monocytes Relative: 9 %
NEUTROS PCT: 72 %
Neutro Abs: 7.1 10*3/uL (ref 1.7–7.7)
Platelets: 240 10*3/uL (ref 150–400)
RBC: 3.56 MIL/uL — AB (ref 4.22–5.81)
RDW: 13 % (ref 11.5–15.5)
WBC: 9.7 10*3/uL (ref 4.0–10.5)

## 2016-09-28 LAB — COMPREHENSIVE METABOLIC PANEL
ALBUMIN: 2.3 g/dL — AB (ref 3.5–5.0)
ALK PHOS: 58 U/L (ref 38–126)
ALT: 15 U/L — AB (ref 17–63)
AST: 21 U/L (ref 15–41)
Anion gap: 6 (ref 5–15)
BUN: 10 mg/dL (ref 6–20)
CALCIUM: 8.7 mg/dL — AB (ref 8.9–10.3)
CO2: 24 mmol/L (ref 22–32)
CREATININE: 0.81 mg/dL (ref 0.61–1.24)
Chloride: 108 mmol/L (ref 101–111)
GFR calc Af Amer: >60 mL/min (ref >60)
GFR calc non Af Amer: >60 mL/min (ref >60)
GLUCOSE: 98 mg/dL (ref 65–99)
Potassium: 4.2 mmol/L (ref 3.5–5.1)
SODIUM: 138 mmol/L (ref 135–145)
Total Bilirubin: 0.2 mg/dL — ABNORMAL LOW (ref 0.3–1.2)
Total Protein: 6.8 g/dL (ref 6.5–8.1)

## 2016-09-28 LAB — GLUCOSE, CAPILLARY
GLUCOSE-CAPILLARY: 141 mg/dL — AB (ref 65–99)
GLUCOSE-CAPILLARY: 108 mg/dL — AB (ref 65–99)
Glucose-Capillary: 84 mg/dL (ref 65–99)
Glucose-Capillary: 166 mg/dL — ABNORMAL HIGH (ref 65–99)

## 2016-09-28 LAB — URINE CULTURE

## 2016-09-28 LAB — MAGNESIUM: Magnesium: 1.6 mg/dL — ABNORMAL LOW (ref 1.7–2.4)

## 2016-09-28 MED ORDER — MAGNESIUM SULFATE 2 GM/50ML IV SOLN
2.0000 g | Freq: Once | INTRAVENOUS | Status: AC
  Administered 2016-09-28: 2 g via INTRAVENOUS
  Filled 2016-09-28: qty 50

## 2016-09-28 NOTE — Progress Notes (Signed)
[facility] Progress Note  Patient: Charles Parker [id number]   PCP: Scarlette Calico, MD DOB: [birth year]   DOA: 09/26/2016   DOS: 09/28/2016   Date of Service: the patient was seen and examined on 09/28/2016  Brief hospital course: Pt. with PMH of Type II DM, DVT, HTN, BPH; admitted on 09/26/2016, with complaint of fever and chills with left flank pain, was found to have Escherichia coli bacteremia with sepsis. Currently further plan is continue IV antibiotics.  Assessment and Plan: 1. Sepsis due to UTI Escherichia coli bacteremia with sepsis. Blood cultures are positive for Escherichia coli, urine culture is also positive for Escherichia coli. Patient is feeling somewhat better. Continues to have left flank pain. Changing IV cefepime to ceftriaxone.  Evidence of obstructive uropathy or hydronephrosis suggest long-term antibiotic requirement.  2. Acute kidney injury resolved - SCr is 1.41 on admission, up from apparent baseline of 0.8 - Likely a prerenal azotemia in setting of sepsis with soft BP, exacerbated by Advil and lisinopril use  - No evidence for urinary obstruction on abd CT  - improvement with fluid-resuscitation = - Hold lisinopril and NSAID, repeat chem panel in am   3. Insulin-dependent DM  - A1c persistently above 12%  - Managed with Lantus 30 units qHS at home  - Check CBG with meals and qHS  - Continue Lantus 30 units qHS with moderate-intensity SSI correctional   4. Hx of DVT - No evidence for acute VTE  - Continue current management with Xarelto    5. OSA  - CPAP qHS as tolerated  6. Musculoskeletal chest pain. Patient mentions that he has some chest pain whenever he moves. Pain is reproducible and located centrally. No hypoxia. We'll check serial troponin and echocardiogram. Chest x-ray unremarkable on admission. EKG unremarkable.  Pain management: Added. Robaxin Activity consulted physical therapy Bowel regimen: last BM  09/28/2016 Diet: Cardiac and diabetic diet DVT Prophylaxis: therapeutic anticoagulation.  Advance goals of care discussion: Full code  Family Communication: no family was present at bedside, at the time of interview.  Disposition:  Discharge to home. Expected discharge date: 10/01/2016,   Consultants: None Procedures: None  Antibiotics: Anti-infectives    Start     Dose/Rate Route Frequency Ordered Stop   09/27/16 1800  cefTRIAXone (ROCEPHIN) 2 g in dextrose 5 % 50 mL IVPB  Status:  Discontinued     2 g 100 mL/hr over 30 Minutes Intravenous Every 24 hours 09/26/16 1857 09/26/16 2234   09/27/16 1100  cefTRIAXone (ROCEPHIN) 2 g in dextrose 5 % 50 mL IVPB     2 g 100 mL/hr over 30 Minutes Intravenous Every 24 hours 09/27/16 1024     09/26/16 2359  ceFEPIme (MAXIPIME) 1 g in dextrose 5 % 50 mL IVPB  Status:  Discontinued     1 g 100 mL/hr over 30 Minutes Intravenous Every 12 hours 09/26/16 2307 09/27/16 1008   09/26/16 2245  ceFEPIme (MAXIPIME) 2 g in dextrose 5 % 50 mL IVPB  Status:  Discontinued     2 g 100 mL/hr over 30 Minutes Intravenous  Once 09/26/16 2233 09/26/16 2306   09/26/16 1845  cefTRIAXone (ROCEPHIN) 2 g in dextrose 5 % 50 mL IVPB     2 g 100 mL/hr over 30 Minutes Intravenous  Once 09/26/16 1835 09/26/16 2030      Subjective: Feeling better. No nausea no vomiting. Oral intake improved.  Objective: Physical Exam: Vitals:   09/27/16 2108 09/28/16 0500 09/28/16 0544 09/28/16 [id number]  BP: 112/69  106/62 127/80  Pulse: 88  72 99  Resp: 16  16   Temp: 97.5 F (36.4 C)  98 F (36.7 C) 98.3 F (36.8 C)  TempSrc: Oral  Oral Oral  SpO2: 96%  98% 99%  Weight:  78.4 kg (172 lb 13.5 oz)      Intake/Output Summary (Last 24 hours) at 09/28/16 1518 Last data filed at 09/28/16 1302  Gross per 24 hour  Intake          3172.92 ml  Output              450 ml  Net          2722.92 ml   Filed Weights   09/28/16 0500  Weight: 78.4 kg (172 lb 13.5 oz)    General:  Alert, Awake and Oriented to Time, Place and Person. Appear in mild distress, affect appropriate Eyes: PERRL, Conjunctiva normal ENT: Oral Mucosa clear moist. Neck: no JVD, no Abnormal Mass Or lumps Cardiovascular: S1 and S2 Present, no Murmur, Respiratory: Bilateral Air entry equal and Decreased, no use of accessory muscle, Clear to Auscultation, no Crackles, no wheezes Abdomen: Bowel Sound present, Soft and CVA tenderness Skin: no redness, no Rash, no induration Extremities: no Pedal edema, no calf tenderness Neurologic: Grossly no focal neuro deficit. Bilaterally Equal motor strength  Data Reviewed: CBC:  Recent Labs Lab 09/26/16 1835 09/27/16 0355 09/28/16 0516  WBC 19.6* 17.3* 9.7  NEUTROABS  --  14.6* 7.1  HGB 11.2* 9.2* 10.1*  HCT 34.2* 27.8* 31.6*  MCV 88.1 87.7 88.8  PLT 307 239 [id number]   Basic Metabolic Panel:  Recent Labs Lab 09/26/16 1835 09/27/16 0355 09/28/16 0516  NA 135 137 138  K 4.0 4.2 4.2  CL 100* 107 108  CO2 21* 25 24  GLUCOSE 197* 161* 98  BUN 15 17 10   CREATININE 1.41* 0.94 0.81  CALCIUM 9.2 8.1* 8.7*  MG  --   --  1.6*    Liver Function Tests:  Recent Labs Lab 09/26/16 1835 09/28/16 0516  AST 32 21  ALT 21 15*  ALKPHOS 74 58  BILITOT 0.8 0.2*  PROT 8.5* 6.8  ALBUMIN 2.9* 2.3*   No results for input(s): LIPASE, AMYLASE in the last 168 hours. No results for input(s): AMMONIA in the last 168 hours. Coagulation Profile:  Recent Labs Lab 09/26/16 2228  INR 1.37   Cardiac Enzymes: No results for input(s): CKTOTAL, CKMB, CKMBINDEX, TROPONINI in the last 168 hours. BNP (last 3 results) No results for input(s): PROBNP in the last 8760 hours.  CBG:  Recent Labs Lab 09/27/16 1205 09/27/16 1629 09/27/16 2106 09/28/16 0730 09/28/16 1137  GLUCAP 124* 105* 161* 84 141*    Studies: No results found.   Scheduled Meds: . cefTRIAXone (ROCEPHIN)  IV  2 g Intravenous Q24H  . insulin aspart  0-15 Units Subcutaneous TID WC  .  insulin aspart  0-5 Units Subcutaneous QHS  . insulin glargine  30 Units Subcutaneous QHS  . methocarbamol  500 mg Oral TID  . multivitamin with minerals  1 tablet Oral Daily  . polyethylene glycol  17 g Oral Daily  . rivaroxaban  20 mg Oral Q breakfast  . simvastatin  40 mg Oral q1800  . sodium chloride flush  3 mL Intravenous Q12H  . tamsulosin  0.4 mg Oral Daily   Continuous Infusions: . sodium chloride 125 mL/hr at 09/28/16 1505   PRN Meds: acetaminophen, HYDROcodone-acetaminophen, morphine injection, ondansetron **  OR** ondansetron (ZOFRAN) IV  Time spent: 30 minutes  Author: Berle Mull, MD [facility] Pager: [phone] 09/28/2016 3:18 PM  If 7PM-7AM, please contact night-coverage at [email or web address], password [facility]

## 2016-09-28 NOTE — Progress Notes (Signed)
CRITICAL VALUE ALERT  Critical value received:  troponin  Date of notification:  09/28/2016  Time of notification:  2210  Critical value read back:Yes.    Nurse who received alert:  Alvester Chou  MD notified (1st page):  Dr. Hilbert Bible  Time of first page:  1014  MD notified (2nd page):  Time of second page:  Responding MD:    Time MD responded:

## 2016-09-28 NOTE — Progress Notes (Signed)
PT NOTE   Imminent DC order received for PT.  After review of Dr. Serita Grit note today expected DC date is 10/01/16. Will follow-up tomorrow with patient to attempt PT eval.     Lavonia Dana, PT  [phone] 09/28/2016

## 2016-09-29 DIAGNOSIS — I10 Essential (primary) hypertension: Secondary | ICD-10-CM

## 2016-09-29 DIAGNOSIS — I824Y9 Acute embolism and thrombosis of unspecified deep veins of unspecified proximal lower extremity: Secondary | ICD-10-CM

## 2016-09-29 DIAGNOSIS — A419 Sepsis, unspecified organism: Principal | ICD-10-CM

## 2016-09-29 DIAGNOSIS — N390 Urinary tract infection, site not specified: Secondary | ICD-10-CM

## 2016-09-29 LAB — GLUCOSE, CAPILLARY
GLUCOSE-CAPILLARY: 129 mg/dL — AB (ref 65–99)
GLUCOSE-CAPILLARY: 103 mg/dL — AB (ref 65–99)
Glucose-Capillary: 137 mg/dL — ABNORMAL HIGH (ref 65–99)
Glucose-Capillary: 141 mg/dL — ABNORMAL HIGH (ref 65–99)

## 2016-09-29 LAB — CULTURE, BLOOD (ROUTINE X 2)

## 2016-09-29 LAB — TROPONIN I

## 2016-09-29 NOTE — Evaluation (Signed)
Physical Therapy Evaluation Patient Details Name: Charles Parker MRN: [id number] DOB: 15-Feb-1946 Today's Date: 09/29/2016   History of Present Illness   Charles Parker is a 70 y.o. male with medical history significant for insulin-dependent diabetes mellitus, history of DVT on Xarelto, hypertension, and BPH who presents to the emergency department with worsening pain in the left flank and lower abdomen, dysuria, and malaise. Patient reports that he was treated in the hospital for pyelonephritis approximately 5 weeks ago and made excellent improvement, but continued to have some residual pain in the left flank.  Clinical Impression  Pt admitted with/for flank/lower abdomen pain due to UTI.  Pt currently limited functionally due to the problems listed below.  (see problems list.)  Pt will benefit from PT to maximize function and safety to be able to get home safely with limited available assist.   Follow Up Recommendations Home health PT;Other (comment) (may need SNF if doesn't attain modified independence quickly)    Equipment Recommendations  None recommended by PT (TBA)    Recommendations for Other Services       Precautions / Restrictions Precautions Precautions: Fall      Mobility  Bed Mobility Overal bed mobility: Needs Assistance Bed Mobility: Supine to Sit     Supine to sit: Min guard     General bed mobility comments: extra time to get OOB, but no assist needed.  Transfers Overall transfer level: Needs assistance   Transfers: Sit to/from Stand Sit to Stand: Min guard         General transfer comment: safe hand placement, holding to stationary object.  Ambulation/Gait Ambulation/Gait assistance: Min guard;Min assist Ambulation Distance (Feet): 100 Feet Assistive device: 1 person hand held assist (rail) Gait Pattern/deviations: Step-through pattern   Gait velocity interpretation: Below normal speed for age/gender General Gait Details: staggered, weak  gait; rail or min assist for stability due to weakness and L knee deformity/instability.  Stairs            Wheelchair Mobility    Modified Rankin (Stroke Patients Only)       Balance Overall balance assessment: Needs assistance Sitting-balance support: Feet supported Sitting balance-Leahy Scale: Fair     Standing balance support: No upper extremity supported;Single extremity supported Standing balance-Leahy Scale: Fair Standing balance comment: can maintain stance statically, but needs AD or assist for mobility                             Pertinent Vitals/Pain Pain Assessment: 0-10 Pain Score: 6  Pain Location: flank, back Pain Descriptors / Indicators: Aching;Sore Pain Intervention(s): Monitored during session;Patient requesting pain meds-RN notified    Home Living Family/patient expects to be discharged to:: Private residence Living Arrangements: Alone Available Help at Discharge: Family;Available PRN/intermittently Type of Home: Apartment Home Access: Level entry     Home Layout: One level Home Equipment: Cane - single point      Prior Function Level of Independence: Needs assistance   Gait / Transfers Assistance Needed: uses cane to be independent           Hand Dominance        Extremity/Trunk Assessment   Upper Extremity Assessment Upper Extremity Assessment: Defer to OT evaluation    Lower Extremity Assessment Lower Extremity Assessment: Generalized weakness;Overall [facility] for tasks assessed (general proximal weakness and L knee instability)       Communication   Communication: No difficulties  Cognition Arousal/Alertness: Awake/alert Behavior During  Therapy: WFL for tasks assessed/performed Overall Cognitive Status: Within Functional Limits for tasks assessed                      General Comments      Exercises     Assessment/Plan    PT Assessment Patient needs continued PT services  PT Problem List  Decreased strength;Decreased activity tolerance;Decreased balance;Decreased mobility;Decreased knowledge of use of DME          PT Treatment Interventions      PT Goals (Current goals can be found in the Care Plan section)  Acute Rehab PT Goals Patient Stated Goal: back home PT Goal Formulation: With patient Time For Goal Achievement: 10/13/16 Potential to Achieve Goals: Good    Frequency Min 3X/week   Barriers to discharge Decreased caregiver support      Co-evaluation               End of Session   Activity Tolerance: Patient tolerated treatment well;Patient limited by fatigue Patient left: in chair;with call bell/phone within reach;with chair alarm set Nurse Communication: Mobility status         Time: 1018-1050 PT Time Calculation (min) (ACUTE ONLY): 32 min   Charges:   PT Evaluation $PT Eval Moderate Complexity: 1 Procedure PT Treatments $Gait Training: 8-22 mins   PT G CodesTessie Fass Lajuan Godbee 09/29/2016, 11:54 AM 09/29/2016  Donnella Sham, PT [phone]  (pager)

## 2016-09-29 NOTE — Progress Notes (Signed)
RT NOTE:  Pt refuses CPAP tonight. 

## 2016-09-29 NOTE — Progress Notes (Signed)
PROGRESS NOTE        PATIENT DETAILS Name: Charles Parker Age: 70 y.o. Sex: male Date of Birth: [birth year] Admit Date: 09/26/2016 Admitting Physician Vianne Bulls, MD SS:[id number] [name], MD  Brief Narrative: Patient is a 70 y.o. male with recent hospitalization for sepsis due to pyelonephritis and Escherichia coli bacteremia admitted on 12/24 with fever/pelvic pain/left flank pain-but cultures again positive for Escherichia coli. See below for further details.  Subjective: Continues to have left flank pain.  Assessment/Plan: Principal Problem: Sepsis due to left pyelonephritis and Escherichia coli bacteremia: Sepsis pathophysiology has resolved. Continue Rocephin, it appears patient was adequately treated with at least 10-12 days of antimicrobial therapy during his last hospitalization. I suspect he has some amount of urinary retention that is contributing to recurrent UTIs. Only approximately 200 mL of residual urine seen on bladder scan this morning. CT abdomen negative for any obstruction. Although he has cholelithiasis seen on CT scan, he does not have any any signs of cholecystitis on exam. Will discuss with infectious disease or any other intervention is warranted.  Acute kidney injury: Probably mild prerenal azotemia, resolved.  Insulin-dependent diabetes: CBGs currently stable, continue Lantus 30 units and SSI. Follow  Dyslipidemia: Continue simvastatin  Hypertension:.Pressure appears stable, lisinopril remains on hold-resume prior to discharge.  BPH: Continue Flomax  History of venous thromboembolism: Continue Xarelto-per PCPs note-needs lifelong anticoagulation  DVT Prophylaxis: Full dose anticoagulation Xarelto  Code Status: Full code   Family Communication: None at bedside  Disposition Plan: Remain inpatient-but will plan on Home health vs SNF on discharge  Antimicrobial agents: See below  Procedures: None  CONSULTS:   None  Time spent: 25- minutes-Greater than 50% of this time was spent in counseling, explanation of diagnosis, planning of further management, and coordination of care.  MEDICATIONS: Anti-infectives    Start     Dose/Rate Route Frequency Ordered Stop   09/27/16 1800  cefTRIAXone (ROCEPHIN) 2 g in dextrose 5 % 50 mL IVPB  Status:  Discontinued     2 g 100 mL/hr over 30 Minutes Intravenous Every 24 hours 09/26/16 1857 09/26/16 2234   09/27/16 1100  cefTRIAXone (ROCEPHIN) 2 g in dextrose 5 % 50 mL IVPB     2 g 100 mL/hr over 30 Minutes Intravenous Every 24 hours 09/27/16 1024     09/26/16 2359  ceFEPIme (MAXIPIME) 1 g in dextrose 5 % 50 mL IVPB  Status:  Discontinued     1 g 100 mL/hr over 30 Minutes Intravenous Every 12 hours 09/26/16 2307 09/27/16 1008   09/26/16 2245  ceFEPIme (MAXIPIME) 2 g in dextrose 5 % 50 mL IVPB  Status:  Discontinued     2 g 100 mL/hr over 30 Minutes Intravenous  Once 09/26/16 2233 09/26/16 2306   09/26/16 1845  cefTRIAXone (ROCEPHIN) 2 g in dextrose 5 % 50 mL IVPB     2 g 100 mL/hr over 30 Minutes Intravenous  Once 09/26/16 1835 09/26/16 2030      Scheduled Meds: . cefTRIAXone (ROCEPHIN)  IV  2 g Intravenous Q24H  . insulin aspart  0-15 Units Subcutaneous TID WC  . insulin aspart  0-5 Units Subcutaneous QHS  . insulin glargine  30 Units Subcutaneous QHS  . methocarbamol  500 mg Oral TID  . multivitamin with minerals  1 tablet Oral Daily  . polyethylene glycol  17 g Oral Daily  . rivaroxaban  20 mg Oral Q breakfast  . simvastatin  40 mg Oral q1800  . sodium chloride flush  3 mL Intravenous Q12H  . tamsulosin  0.4 mg Oral Daily   Continuous Infusions: PRN Meds:.acetaminophen, HYDROcodone-acetaminophen, morphine injection, ondansetron **OR** ondansetron (ZOFRAN) IV   PHYSICAL EXAM: Vital signs: Vitals:   09/28/16 1901 09/28/16 2050 09/29/16 0507 09/29/16 0943  BP:  (!) 165/105 127/85 (!) 153/95  Pulse:  97 97 96  Resp:  18 16 17   Temp:  98.3 F  (36.8 C) 99.5 F (37.5 C) 98 F (36.7 C)  TempSrc:  Oral Oral Oral  SpO2:  98% 97% 96%  Weight:      Height: 6' (1.829 m)      Filed Weights   09/28/16 0500  Weight: 78.4 kg (172 lb 13.5 oz)   Body mass index is 23.44 kg/m.   General appearance :Awake, alert, not in any distress. Speech Clear. Not toxic Looking Eyes:, pupils equally reactive to light and accomodation,no scleral icterus.Pink conjunctiva HEENT: Atraumatic and Normocephalic Neck: supple, no JVD. No cervical lymphadenopathy. No thyromegaly Resp:Good air entry bilaterally, no added sounds  CVS: S1 S2 regular, no murmurs.  GI: Bowel sounds present, Non tender and not distended with no gaurding, rigidity or rebound.No organomegaly.Left CVA angle is tender Extremities: B/L Lower Ext shows no edema, both legs are warm to touch Neurology:  speech clear,Non focal, sensation is grossly intact. Psychiatric: Alert and oriented x 3. Normal mood. Musculoskeletal:.No digital cyanosis Skin:No Rash, warm and dry Wounds:N/A  I have personally reviewed following labs and imaging studies  LABORATORY DATA: CBC:  Recent Labs Lab 09/26/16 1835 09/27/16 0355 09/28/16 0516  WBC 19.6* 17.3* 9.7  NEUTROABS  --  14.6* 7.1  HGB 11.2* 9.2* 10.1*  HCT 34.2* 27.8* 31.6*  MCV 88.1 87.7 88.8  PLT 307 239 [id number]    Basic Metabolic Panel:  Recent Labs Lab 09/26/16 1835 09/27/16 0355 09/28/16 0516  NA 135 137 138  K 4.0 4.2 4.2  CL 100* 107 108  CO2 21* 25 24  GLUCOSE 197* 161* 98  BUN 15 17 10   CREATININE 1.41* 0.94 0.81  CALCIUM 9.2 8.1* 8.7*  MG  --   --  1.6*    GFR: Estimated Creatinine Clearance: 93.1 mL/min (by C-G formula based on SCr of 0.81 mg/dL).  Liver Function Tests:  Recent Labs Lab 09/26/16 1835 09/28/16 0516  AST 32 21  ALT 21 15*  ALKPHOS 74 58  BILITOT 0.8 0.2*  PROT 8.5* 6.8  ALBUMIN 2.9* 2.3*   No results for input(s): LIPASE, AMYLASE in the last 168 hours. No results for input(s):  AMMONIA in the last 168 hours.  Coagulation Profile:  Recent Labs Lab 09/26/16 2228  INR 1.37    Cardiac Enzymes:  Recent Labs Lab 09/28/16 1542 09/28/16 2108 09/29/16 0529  TROPONINI <0.03 0.04* <0.03    BNP (last 3 results) No results for input(s): PROBNP in the last 8760 hours.  HbA1C: No results for input(s): HGBA1C in the last 72 hours.  CBG:  Recent Labs Lab 09/28/16 1137 09/28/16 1701 09/28/16 2145 09/29/16 0913 09/29/16 1142  GLUCAP 141* 108* 166* 129* 103*    Lipid Profile: No results for input(s): CHOL, HDL, LDLCALC, TRIG, CHOLHDL, LDLDIRECT in the last 72 hours.  Thyroid Function Tests: No results for input(s): TSH, T4TOTAL, FREET4, T3FREE, THYROIDAB in the last 72 hours.  Anemia Panel: No results for input(s): VITAMINB12, FOLATE, FERRITIN,  TIBC, IRON, RETICCTPCT in the last 72 hours.  Urine analysis:    Component Value Date/Time   COLORURINE AMBER (A) 09/26/2016 1835   APPEARANCEUR CLOUDY (A) 09/26/2016 1835   LABSPEC 1.018 09/26/2016 1835   PHURINE 5.0 09/26/2016 1835   GLUCOSEU NEGATIVE 09/26/2016 1835   GLUCOSEU NEGATIVE 09/08/2016 1513   HGBUR LARGE (A) 09/26/2016 1835   BILIRUBINUR NEGATIVE 09/26/2016 1835   BILIRUBINUR negative 08/17/2016 1359   KETONESUR 20 (A) 09/26/2016 1835   PROTEINUR 30 (A) 09/26/2016 1835   UROBILINOGEN 0.2 09/08/2016 1513   NITRITE POSITIVE (A) 09/26/2016 1835   LEUKOCYTESUR LARGE (A) 09/26/2016 1835    Sepsis Labs: Lactic Acid, Venous    Component Value Date/Time   LATICACIDVEN 1.0 09/27/2016 0355    MICROBIOLOGY: Recent Results (from the past 240 hour(s))  Blood Culture (routine x 2)     Status: Abnormal   Collection Time: 09/26/16  6:35 PM  Result Value Ref Range Status   Specimen Description BLOOD RIGHT ANTECUBITAL  Final   Special Requests BOTTLES DRAWN AEROBIC AND ANAEROBIC 5CC  Final   Culture  Setup Time   Final    GRAM NEGATIVE RODS IN BOTH AEROBIC AND ANAEROBIC BOTTLES Organism ID  to follow CRITICAL RESULT CALLED TO, READ BACK BY AND VERIFIED WITH: Denton Brick, PHARM AT 0811 ON [id number] BY Rhea Bleacher    Culture ESCHERICHIA COLI (A)  Final   Report Status 09/29/2016 FINAL  Final   Organism ID, Bacteria ESCHERICHIA COLI  Final      Susceptibility   Escherichia coli - MIC*    AMPICILLIN >=32 RESISTANT Resistant     CEFAZOLIN <=4 SENSITIVE Sensitive     CEFEPIME <=1 SENSITIVE Sensitive     CEFTAZIDIME <=1 SENSITIVE Sensitive     CEFTRIAXONE <=1 SENSITIVE Sensitive     CIPROFLOXACIN <=0.25 SENSITIVE Sensitive     GENTAMICIN <=1 SENSITIVE Sensitive     IMIPENEM <=0.25 SENSITIVE Sensitive     TRIMETH/SULFA <=20 SENSITIVE Sensitive     AMPICILLIN/SULBACTAM 16 INTERMEDIATE Intermediate     PIP/TAZO <=4 SENSITIVE Sensitive     Extended ESBL NEGATIVE Sensitive     * ESCHERICHIA COLI  Urine culture     Status: Abnormal   Collection Time: 09/26/16  6:35 PM  Result Value Ref Range Status   Specimen Description URINE, RANDOM  Final   Special Requests NONE  Final   Culture >=100,000 COLONIES/mL ESCHERICHIA COLI (A)  Final   Report Status 09/28/2016 FINAL  Final   Organism ID, Bacteria ESCHERICHIA COLI (A)  Final      Susceptibility   Escherichia coli - MIC*    AMPICILLIN >=32 RESISTANT Resistant     CEFAZOLIN <=4 SENSITIVE Sensitive     CEFTRIAXONE <=1 SENSITIVE Sensitive     CIPROFLOXACIN <=0.25 SENSITIVE Sensitive     GENTAMICIN <=1 SENSITIVE Sensitive     IMIPENEM <=0.25 SENSITIVE Sensitive     NITROFURANTOIN <=16 SENSITIVE Sensitive     TRIMETH/SULFA <=20 SENSITIVE Sensitive     AMPICILLIN/SULBACTAM 16 INTERMEDIATE Intermediate     PIP/TAZO <=4 SENSITIVE Sensitive     Extended ESBL NEGATIVE Sensitive     * >=100,000 COLONIES/mL ESCHERICHIA COLI  Blood Culture ID Panel (Reflexed)     Status: Abnormal   Collection Time: 09/26/16  6:35 PM  Result Value Ref Range Status   Enterococcus species NOT DETECTED NOT DETECTED Final   Listeria monocytogenes NOT DETECTED  NOT DETECTED Final   Staphylococcus species NOT DETECTED NOT DETECTED Final  Staphylococcus aureus NOT DETECTED NOT DETECTED Final   Streptococcus species NOT DETECTED NOT DETECTED Final   Streptococcus agalactiae NOT DETECTED NOT DETECTED Final   Streptococcus pneumoniae NOT DETECTED NOT DETECTED Final   Streptococcus pyogenes NOT DETECTED NOT DETECTED Final   Acinetobacter baumannii NOT DETECTED NOT DETECTED Final   Enterobacteriaceae species DETECTED (A) NOT DETECTED Final    Comment: CRITICAL RESULT CALLED TO, READ BACK BY AND VERIFIED WITH: G. ABBOTT, PHARM AT 0811 ON [id number] BY S. YARBROUGH    Enterobacter cloacae complex NOT DETECTED NOT DETECTED Final   Escherichia coli DETECTED (A) NOT DETECTED Final    Comment: CRITICAL RESULT CALLED TO, READ BACK BY AND VERIFIED WITH: G. ABBOTT, PHARM AT 0811 ON [id number] BY S. YARBROUGH    Klebsiella oxytoca NOT DETECTED NOT DETECTED Final   Klebsiella pneumoniae NOT DETECTED NOT DETECTED Final   Proteus species NOT DETECTED NOT DETECTED Final   Serratia marcescens NOT DETECTED NOT DETECTED Final   Carbapenem resistance NOT DETECTED NOT DETECTED Final   Haemophilus influenzae NOT DETECTED NOT DETECTED Final   Neisseria meningitidis NOT DETECTED NOT DETECTED Final   Pseudomonas aeruginosa NOT DETECTED NOT DETECTED Final   Candida albicans NOT DETECTED NOT DETECTED Final   Candida glabrata NOT DETECTED NOT DETECTED Final   Candida krusei NOT DETECTED NOT DETECTED Final   Candida parapsilosis NOT DETECTED NOT DETECTED Final   Candida tropicalis NOT DETECTED NOT DETECTED Final  Blood Culture (routine x 2)     Status: None (Preliminary result)   Collection Time: 09/26/16  6:50 PM  Result Value Ref Range Status   Specimen Description BLOOD LEFT ANTECUBITAL  Final   Special Requests BOTTLES DRAWN AEROBIC AND ANAEROBIC 5CC  Final   Culture NO GROWTH 3 DAYS  Final   Report Status PENDING  Incomplete  Culture, blood (routine x 2)     Status: None  (Preliminary result)   Collection Time: 09/28/16  5:15 AM  Result Value Ref Range Status   Specimen Description BLOOD RIGHT HAND  Final   Special Requests BOTTLES DRAWN AEROBIC ONLY 6CC  Final   Culture NO GROWTH 1 DAY  Final   Report Status PENDING  Incomplete  Culture, blood (routine x 2)     Status: None (Preliminary result)   Collection Time: 09/28/16  5:20 AM  Result Value Ref Range Status   Specimen Description BLOOD LEFT HAND  Final   Special Requests IN PEDIATRIC BOTTLE 2CC  Final   Culture NO GROWTH 1 DAY  Final   Report Status PENDING  Incomplete  Culture, blood (routine x 2)     Status: None (Preliminary result)   Collection Time: 09/28/16  3:35 PM  Result Value Ref Range Status   Specimen Description BLOOD RIGHT HAND  Final   Special Requests IN PEDIATRIC BOTTLE 2CC  Final   Culture NO GROWTH < 24 HOURS  Final   Report Status PENDING  Incomplete  Culture, blood (routine x 2)     Status: None (Preliminary result)   Collection Time: 09/28/16  3:40 PM  Result Value Ref Range Status   Specimen Description BLOOD LEFT HAND  Final   Special Requests IN PEDIATRIC BOTTLE 2CC  Final   Culture NO GROWTH < 24 HOURS  Final   Report Status PENDING  Incomplete    RADIOLOGY STUDIES/RESULTS: Dg Chest 2 View  Result Date: 09/26/2016 CLINICAL DATA:  Chest pain for several weeks EXAM: CHEST  2 VIEW COMPARISON:  08/17/2016 FINDINGS: The heart  size and mediastinal contours are within normal limits. Both lungs are clear. The visualized skeletal structures are unremarkable. IMPRESSION: No active cardiopulmonary disease. Electronically Signed   By: Inez Catalina M.D.   On: 09/26/2016 20:32   Ct Abdomen Pelvis W Contrast  Result Date: 09/26/2016 CLINICAL DATA:  Mid abdominal pain radiating to his back x1 day. EXAM: CT ABDOMEN AND PELVIS WITH CONTRAST TECHNIQUE: Multidetector CT imaging of the abdomen and pelvis was performed using the standard protocol following bolus administration of  intravenous contrast. CONTRAST:  167mL ISOVUE-300 IOPAMIDOL (ISOVUE-300) INJECTION 61% COMPARISON:  08/31/2016 go FINDINGS: Lower chest: There is mild bilateral lower lobe bronchiectasis. Clearing of bibasilar atelectasis and/or pneumonia. Top-normal sized heart without pericardial nor pleural effusions. No pneumothoraces. Hepatobiliary: No space-occupying mass of the liver. Stable 6 mm hypodensity in the left hepatic lobe statistically consistent with a cyst or hemangioma. No intrahepatic ductal dilatation. Tiny dependent gallstones without secondary stoma signs: Cystitis. Pancreas: No pancreatic mass. A few speckled calcifications along the pancreatic tail may be vascular in etiology or related to chronic pancreatitis. No pancreatic ductal dilatation or surrounding inflammatory changes. Spleen: Normal in size without focal abnormality. Adrenals/Urinary Tract: Normal adrenal glands. Multiple hypodense lesions of both kidneys consistent with cysts remain unchanged. The largest are in the upper pole on the right measuring up to 5.7 cm and on the left 3.7 cm. No obstructive uropathy. No nephrolithiasis. No solid enhancing mass lesions. Mild circumferential thickening of the bladder is again noted which may be related to underdistention and/or cystitis. Stomach/Bowel: The stomach is contracted in appearance. There is moderate colonic stool burden without acute inflammation. Normal-appearing appendix. No small bowel obstruction. Scattered colonic diverticulosis. Vascular/Lymphatic: Aortic atherosclerosis. No enlarged abdominal or pelvic lymph nodes. Reproductive: Irregular enlarged prostate gland containing central zone calcifications are unchanged in appearance. This impresses upon the base of the bladder as before. Other: No free air or free fluid. Musculoskeletal: No acute nor suspicious osseous abnormality. Lower lumbar degenerative facet arthropathy. IMPRESSION: 1. Clearing of bibasilar atelectasis and/or pneumonia  since prior exam. Bilateral lower lobe bronchiectasis is incidentally noted. 2. Stable bilateral renal hypodensities consistent with cysts. No obstructive uropathy. 3. Uncomplicated cholelithiasis. 4. Chronic mild circumferential thickening of the urinary bladder as before may be related to cystitis. 5. Stable prostatomegaly. 6. Lower lumbar facet arthropathy. 7. Moderate colonic stool burden. No bowel obstruction or acute inflammation. Electronically Signed   By: Ashley Royalty M.D.   On: 09/26/2016 21:39   Ct Abdomen Pelvis W Contrast  Result Date: 08/31/2016 CLINICAL DATA:  Left flank pain radiating to the low back for 1 week history of kidney infection EXAM: CT ABDOMEN AND PELVIS WITH CONTRAST TECHNIQUE: Multidetector CT imaging of the abdomen and pelvis was performed using the standard protocol following bolus administration of intravenous contrast. CONTRAST:  100 mL Isovue-300 intravenous COMPARISON:  08/17/2016 FINDINGS: Lower chest: No large pleural effusion. Streaky atelectasis or pneumonia within the posterior lower lobes, increased compared to prior.The heart does not appear enlarged. No significant pericardial effusion. Hepatobiliary: No intra hepatic biliary dilatation. A small hypodensity within the lateral segment of the left hepatic lobe is unchanged. No biliary dilatation. Mild increased density in the gallbladder consistent with small stones. No wall thickening. Pancreas: Unremarkable. No pancreatic ductal dilatation or surrounding inflammatory changes. Spleen: Normal in size without focal abnormality. Adrenals/Urinary Tract: Adrenal glands are within normal limits. Again visualized are multiple hypodense lesions within the bilateral kidneys, many of which are felt consistent with cysts. The largest on the  right is seen within the upper pole and measures 5.3 cm. The largest on the left is seen within the midpole and measures 3.6 cm. Other sub cm lesions in both kidneys too small to further  characterize. The bladder is thick walled and slightly irregular in appearance. Stomach/Bowel: The stomach is nonenlarged. No evidence for small bowel obstruction. The appendix is visualized and is normal. Vascular/Lymphatic: Aortic atherosclerosis. No enlarged abdominal or pelvic lymph nodes. Reproductive: Irregular enlarged prostate gland containing multiple calcifications. Other: No free air or free fluid. Musculoskeletal: Degenerative changes of the spine. No acute osseous abnormality. IMPRESSION: 1. No evidence for perinephric fluid collection. Homogeneously enhancement of the kidneys. Bilateral renal cysts. Additional sub cm hypodense lesions in the kidneys too small to further characterize. No hydronephrosis. 2. Irregular thick-walled appearance of the urinary bladder could relate to a cystitis. 3. Increased streaky atelectasis or pneumonia within the posterior left greater than right lower lobes. 4. Enlarged irregular prostate gland with calcifications, recommend correlation with PSA. Electronically Signed   By: [name] Foil M.D.   On: 08/31/2016 02:59     LOS: 3 days   Oren Binet, MD  [facility] Pager:[phone]  If 7PM-7AM, please contact night-coverage [email or web address] Password [email or web address] 09/29/2016, 3:08 PM

## 2016-09-29 NOTE — Discharge Instructions (Signed)
Information on my medicine - XARELTO (rivaroxaban)  This medication education was reviewed with me or my healthcare representative as part of my discharge preparation.    WHY WAS XARELTO PRESCRIBED FOR YOU? Xarelto was prescribed to treat blood clots that may have been found in the veins of your legs (deep vein thrombosis) or in your lungs (pulmonary embolism) and to reduce the risk of them occurring again.  What do you need to know about Xarelto? Your current dose is one 20 mg tablet taken ONCE A DAY with your breakfast.  DO NOT stop taking Xarelto without talking to the health care provider who prescribed the medication.  Refill your prescription for 20 mg tablets before you run out.  After discharge, you should have regular check-up appointments with your healthcare provider that is prescribing your Xarelto.  In the future your dose may need to be changed if your kidney function changes by a significant amount.  What do you do if you miss a dose? If you are taking Xarelto TWICE DAILY and you miss a dose, take it as soon as you remember. You may take two 15 mg tablets (total 30 mg) at the same time then resume your regularly scheduled 15 mg twice daily the next day.  If you are taking Xarelto ONCE DAILY and you miss a dose, take it as soon as you remember on the same day then continue your regularly scheduled once daily regimen the next day. Do not take two doses of Xarelto at the same time.   Important Safety Information Xarelto is a blood thinner medicine that can cause bleeding. You should call your healthcare provider right away if you experience any of the following: ? Bleeding from an injury or your nose that does not stop. ? Unusual colored urine (red or dark brown) or unusual colored stools (red or black). ? Unusual bruising for unknown reasons. ? A serious fall or if you hit your head (even if there is no bleeding).  Some medicines may interact with Xarelto and might  increase your risk of bleeding while on Xarelto. To help avoid this, consult your healthcare provider or pharmacist prior to using any new prescription or non-prescription medications, including herbals, vitamins, non-steroidal anti-inflammatory drugs (NSAIDs) and supplements.  This website has more information on Xarelto: [email or web address]

## 2016-09-30 ENCOUNTER — Inpatient Hospital Stay (HOSPITAL_COMMUNITY): Payer: Commercial Managed Care - HMO

## 2016-09-30 ENCOUNTER — Encounter (HOSPITAL_COMMUNITY): Payer: Self-pay | Admitting: Radiology

## 2016-09-30 DIAGNOSIS — E119 Type 2 diabetes mellitus without complications: Secondary | ICD-10-CM

## 2016-09-30 DIAGNOSIS — Z794 Long term (current) use of insulin: Secondary | ICD-10-CM

## 2016-09-30 LAB — GLUCOSE, CAPILLARY
GLUCOSE-CAPILLARY: 122 mg/dL — AB (ref 65–99)
GLUCOSE-CAPILLARY: 122 mg/dL — AB (ref 65–99)
Glucose-Capillary: 91 mg/dL (ref 65–99)
Glucose-Capillary: 138 mg/dL — ABNORMAL HIGH (ref 65–99)

## 2016-09-30 MED ORDER — GADOBENATE DIMEGLUMINE 529 MG/ML IV SOLN
20.0000 mL | Freq: Once | INTRAVENOUS | Status: AC | PRN
  Administered 2016-09-30: 20 mL via INTRAVENOUS

## 2016-09-30 MED ORDER — ACETAMINOPHEN 500 MG PO TABS
1000.0000 mg | ORAL_TABLET | Freq: Three times a day (TID) | ORAL | Status: DC
  Administered 2016-09-30 – 2016-10-02 (×6): 1000 mg via ORAL
  Filled 2016-09-30 (×6): qty 2

## 2016-09-30 NOTE — Progress Notes (Signed)
Physical Therapy Treatment Patient Details Name: Charles Parker MRN: [id number] DOB: 1945-11-16 Today's Date: 09/30/2016    History of Present Illness  Charles Parker is a 70 y.o. male with medical history significant for insulin-dependent diabetes mellitus, history of DVT on Xarelto, hypertension, and BPH who presents to the emergency department with worsening pain in the left flank and lower abdomen, dysuria, and malaise. Patient reports that he was treated in the hospital for pyelonephritis approximately 5 weeks ago and made excellent improvement, but continued to have some residual pain in the left flank.    PT Comments    Pt progressing steadily.  Gait still unsteady and pt in need of strengthening and reconditioning.  I agree with need for SNF rehab prior to d/c home.  Follow Up Recommendations  SNF     Equipment Recommendations  Rolling walker with 5" wheels    Recommendations for Other Services       Precautions / Restrictions Precautions Precautions: Fall    Mobility  Bed Mobility Overal bed mobility: Needs Assistance Bed Mobility: Supine to Sit     Supine to sit: Min guard     General bed mobility comments: extra time to get OOB, but no assist needed.  Transfers Overall transfer level: Needs assistance Equipment used: Rolling walker (2 wheeled) Transfers: Sit to/from [facility] Sit to Stand: Min guard;Min assist (min from bed) Stand pivot transfers: Min guard       General transfer comment: slow to rise from [facility]  Ambulation/Gait Ambulation/Gait assistance: Min guard Ambulation Distance (Feet): 300 Feet Assistive device: Rolling walker (2 wheeled) Gait Pattern/deviations: Step-through pattern   Gait velocity interpretation: Below normal speed for age/gender General Gait Details: more steady gait with RW, still with some L knee instability and a little fatigue.   Stairs            Wheelchair Mobility    Modified Rankin  (Stroke Patients Only)       Balance Overall balance assessment: Needs assistance   Sitting balance-Leahy Scale: Good     Standing balance support: Bilateral upper extremity supported;No upper extremity supported Standing balance-Leahy Scale: Fair                      Cognition Arousal/Alertness: Awake/alert Behavior During Therapy: WFL for tasks assessed/performed Overall Cognitive Status: Within Functional Limits for tasks assessed                      Exercises General Exercises - Lower Extremity Straight Leg Raises: AROM;Strengthening;Both;10 reps;Supine Hip Flexion/Marching: AROM;Strengthening;Both;10 reps;Supine (graded resistance)    General Comments        Pertinent Vitals/Pain Pain Assessment: Faces Pain Score: 0-No pain Faces Pain Scale: Hurts little more Pain Location: flank, back Pain Descriptors / Indicators: Aching;Sore Pain Intervention(s): Monitored during session;Repositioned    Home Living                      Prior Function            PT Goals (current goals can now be found in the care plan section) Acute Rehab PT Goals Patient Stated Goal: back home PT Goal Formulation: With patient Time For Goal Achievement: 10/13/16 Potential to Achieve Goals: Good Progress towards PT goals: Progressing toward goals    Frequency    Min 3X/week      PT Plan Current plan remains appropriate    Co-evaluation  End of Session   Activity Tolerance: Patient tolerated treatment well;Patient limited by fatigue Patient left: in chair;with call bell/phone within reach;with chair alarm set     Time: [id number] PT Time Calculation (min) (ACUTE ONLY): 30 min  Charges:  $Gait Training: 8-22 mins $Therapeutic Activity: 8-22 mins                    G CodesTessie Fass Ivianna Notch 09/30/2016, 1:49 PM  09/30/2016  Donnella Sham, PT [phone]  (pager)

## 2016-09-30 NOTE — Consult Note (Addendum)
   [facility] CM Inpatient Consult   09/30/2016  Charles Parker 03/11/1946 [id number]   Patient is currently active with [facility] for chronic disease management services.  Patient has been engaged by a RN Community Care Coordinator prior to hospitalization. Charles Parker is a 70 y.o. male with medical history significant for insulin-dependent diabetes mellitus, history of DVT on Xarelto, hypertension, and BPH who presents to the emergency department with worsening pain in the left flank and lower abdomen, dysuria, and malaise. Patient reports that he was treated in the hospital for pyelonephritis approximately 5 weeks ago and made excellent improvement, but continued to have some residual pain in the left flank.   Patient has an active consent on file.  Patient's current disposition is for a skilled facility for rehab.  Met with the patient at the bedside with his brothers, Charles Parker and Charles Parker.  The patient's disposition is currently for a skilled nursing facility for rehab.  Patient and brothers express that they would like to continue [facility] for post hospital follow up from the skilled stay.  Patient will likely need food stamps, meals on wheels,  as the patient lives alone.  Discussed ongoing need for Medicaid and   Made Inpatient Case Manager aware that [facility] following. Of note, [facility] services does not replace or interfere with any services that are needed or arranged by inpatient case management or social work.  For additional questions or referrals please contact:   , RN BSN CCM [facility] Hospital Liaison  [phone] business mobile phone Toll free office [phone]    

## 2016-09-30 NOTE — Progress Notes (Addendum)
PROGRESS NOTE        PATIENT DETAILS Name: Charles Parker Age: 70 y.o. Sex: male Date of Birth: 1945-10-20 Admit Date: 09/26/2016 Admitting Physician Vianne Bulls, MD [id number] [name], MD  Brief Narrative: Patient is a 70 y.o. male with recent hospitalization for sepsis due to pyelonephritis and Escherichia coli bacteremia admitted on 12/24 with fever/pelvic pain/left flank pain-but cultures again positive for Escherichia coli. See below for further details.  Subjective: Continues to back pain-mid lower back-and left flank pain. Although slightly improved.   Assessment/Plan: Principal Problem: Sepsis due to left pyelonephritis and Escherichia coli bacteremia: Sepsis pathophysiology has resolved. Continue Rocephin, will switch to ciprofloxacin on discharge. Patient was hospitalized from 11/14-11/17 for similar issues and Escherichia coli bacteremia-initially he claimed that he completed antimicrobial therapy as prescribed-but today he acknowledges that he may have not taken antibiotics as prescribed and still has some pills left at home. Had spoken with Dr. Johnnye Sima yesterday, recommendations were to treat with antimicrobial therapy for total of 14 days. Since he continues to have back pain, we'll go ahead and check MRI of his LS spine today. Note, CT abdomen negative for any obstruction. Although he has cholelithiasis seen on CT scan, he does not have any any signs of cholecystitis on exam.   Addendum 6:40 pm-called by radiology to discuss MRI LS-spine results-patient apparently has a small epidural abscess, and a phlegmon around L4-L5 area. Case was then subsequently discussed with Dr. Marland Kitchen Ditty over the phone-who reviewed the MRI results, and thought that patient probably had osteomyelitis of that area and recommended medical management. We will officially consult infectious disease tomorrow morning. Note-he does not have any focal neurological deficits on my  exam this morning.  Acute kidney injury: Probably mild prerenal azotemia, resolved.  Insulin-dependent diabetes: CBGs currently stable, continue Lantus 30 units and SSI. Follow  Dyslipidemia: Continue simvastatin  Hypertension:.Pressure appears stable, lisinopril remains on hold-resume prior to discharge.  BPH: Continue Flomax  History of venous thromboembolism: Continue Xarelto-per PCPs note-needs lifelong anticoagulation  DVT Prophylaxis: Full dose anticoagulation Xarelto  Code Status: Full code   Family Communication: None at bedside  Disposition Plan: Remain inpatient- SNF on discharge-likely 12/29  Antimicrobial agents: See below  Procedures: None  CONSULTS:  None  Time spent: 25- minutes-Greater than 50% of this time was spent in counseling, explanation of diagnosis, planning of further management, and coordination of care.  MEDICATIONS: Anti-infectives    Start     Dose/Rate Route Frequency Ordered Stop   09/27/16 1800  cefTRIAXone (ROCEPHIN) 2 g in dextrose 5 % 50 mL IVPB  Status:  Discontinued     2 g 100 mL/hr over 30 Minutes Intravenous Every 24 hours 09/26/16 1857 09/26/16 2234   09/27/16 1100  cefTRIAXone (ROCEPHIN) 2 g in dextrose 5 % 50 mL IVPB     2 g 100 mL/hr over 30 Minutes Intravenous Every 24 hours 09/27/16 1024     09/26/16 2359  ceFEPIme (MAXIPIME) 1 g in dextrose 5 % 50 mL IVPB  Status:  Discontinued     1 g 100 mL/hr over 30 Minutes Intravenous Every 12 hours 09/26/16 2307 09/27/16 1008   09/26/16 2245  ceFEPIme (MAXIPIME) 2 g in dextrose 5 % 50 mL IVPB  Status:  Discontinued     2 g 100 mL/hr over 30 Minutes Intravenous  Once 09/26/16 2233  09/26/16 2306   09/26/16 1845  cefTRIAXone (ROCEPHIN) 2 g in dextrose 5 % 50 mL IVPB     2 g 100 mL/hr over 30 Minutes Intravenous  Once 09/26/16 1835 09/26/16 2030      Scheduled Meds: . cefTRIAXone (ROCEPHIN)  IV  2 g Intravenous Q24H  . insulin aspart  0-15 Units Subcutaneous TID WC  .  insulin aspart  0-5 Units Subcutaneous QHS  . insulin glargine  30 Units Subcutaneous QHS  . methocarbamol  500 mg Oral TID  . multivitamin with minerals  1 tablet Oral Daily  . polyethylene glycol  17 g Oral Daily  . rivaroxaban  20 mg Oral Q breakfast  . simvastatin  40 mg Oral q1800  . sodium chloride flush  3 mL Intravenous Q12H  . tamsulosin  0.4 mg Oral Daily   Continuous Infusions: PRN Meds:.acetaminophen, HYDROcodone-acetaminophen, morphine injection, ondansetron **OR** ondansetron (ZOFRAN) IV   PHYSICAL EXAM: Vital signs: Vitals:   09/29/16 1655 09/29/16 2023 09/30/16 0456 09/30/16 0838  BP: (!) 172/93 (!) 161/84 (!) 142/94 (!) 144/91  Pulse: 92 100 (!) 103 97  Resp: 18 17 18 18   Temp: 99.6 F (37.6 C) 98.9 F (37.2 C) 99.3 F (37.4 C) 100 F (37.8 C)  TempSrc: Oral Oral Oral Oral  SpO2: 92% 95% 97% 96%  Weight:  84.3 kg (185 lb 13.6 oz)    Height:       Filed Weights   09/28/16 0500 09/29/16 2023  Weight: 78.4 kg (172 lb 13.5 oz) 84.3 kg (185 lb 13.6 oz)   Body mass index is 25.21 kg/m.   General appearance :Awake, alert, not in any distress. Speech Clear. Not toxic Looking Eyes:, pupils equally reactive to light and accomodation,no scleral icterus.Pink conjunctiva HEENT: Atraumatic and Normocephalic Neck: supple, no JVD. No cervical lymphadenopathy. No thyromegaly Resp:Good air entry bilaterally, no added sounds  CVS: S1 S2 regular, no murmurs.  GI: Bowel sounds present, Non tender and not distended with no gaurding, rigidity or rebound.No organomegaly.Left CVA angle is tender Extremities: B/L Lower Ext shows no edema, both legs are warm to touch Neurology:  speech clear,Non focal, sensation is grossly intact. Psychiatric: Alert and oriented x 3. Normal mood. Musculoskeletal:.No digital cyanosis Skin:No Rash, warm and dry Wounds:N/A  I have personally reviewed following labs and imaging studies  LABORATORY DATA: CBC:  Recent Labs Lab 09/26/16 1835  09/27/16 0355 09/28/16 0516  WBC 19.6* 17.3* 9.7  NEUTROABS  --  14.6* 7.1  HGB 11.2* 9.2* 10.1*  HCT 34.2* 27.8* 31.6*  MCV 88.1 87.7 88.8  PLT 307 239 [id number]    Basic Metabolic Panel:  Recent Labs Lab 09/26/16 1835 09/27/16 0355 09/28/16 0516  NA 135 137 138  K 4.0 4.2 4.2  CL 100* 107 108  CO2 21* 25 24  GLUCOSE 197* 161* 98  BUN 15 17 10   CREATININE 1.41* 0.94 0.81  CALCIUM 9.2 8.1* 8.7*  MG  --   --  1.6*    GFR: Estimated Creatinine Clearance: 93.1 mL/min (by C-G formula based on SCr of 0.81 mg/dL).  Liver Function Tests:  Recent Labs Lab 09/26/16 1835 09/28/16 0516  AST 32 21  ALT 21 15*  ALKPHOS 74 58  BILITOT 0.8 0.2*  PROT 8.5* 6.8  ALBUMIN 2.9* 2.3*   No results for input(s): LIPASE, AMYLASE in the last 168 hours. No results for input(s): AMMONIA in the last 168 hours.  Coagulation Profile:  Recent Labs Lab 09/26/16 2228  INR  1.37    Cardiac Enzymes:  Recent Labs Lab 09/28/16 1542 09/28/16 2108 09/29/16 0529  TROPONINI <0.03 0.04* <0.03    BNP (last 3 results) No results for input(s): PROBNP in the last 8760 hours.  HbA1C: No results for input(s): HGBA1C in the last 72 hours.  CBG:  Recent Labs Lab 09/29/16 1142 09/29/16 1629 09/29/16 2026 09/30/16 0735 09/30/16 1146  GLUCAP 103* 137* 141* 91 138*    Lipid Profile: No results for input(s): CHOL, HDL, LDLCALC, TRIG, CHOLHDL, LDLDIRECT in the last 72 hours.  Thyroid Function Tests: No results for input(s): TSH, T4TOTAL, FREET4, T3FREE, THYROIDAB in the last 72 hours.  Anemia Panel: No results for input(s): VITAMINB12, FOLATE, FERRITIN, TIBC, IRON, RETICCTPCT in the last 72 hours.  Urine analysis:    Component Value Date/Time   COLORURINE AMBER (A) 09/26/2016 1835   APPEARANCEUR CLOUDY (A) 09/26/2016 1835   LABSPEC 1.018 09/26/2016 1835   PHURINE 5.0 09/26/2016 1835   GLUCOSEU NEGATIVE 09/26/2016 1835   GLUCOSEU NEGATIVE 09/08/2016 1513   HGBUR LARGE (A)  09/26/2016 1835   BILIRUBINUR NEGATIVE 09/26/2016 1835   BILIRUBINUR negative 08/17/2016 1359   KETONESUR 20 (A) 09/26/2016 1835   PROTEINUR 30 (A) 09/26/2016 1835   UROBILINOGEN 0.2 09/08/2016 1513   NITRITE POSITIVE (A) 09/26/2016 1835   LEUKOCYTESUR LARGE (A) 09/26/2016 1835    Sepsis Labs: Lactic Acid, Venous    Component Value Date/Time   LATICACIDVEN 1.0 09/27/2016 0355    MICROBIOLOGY: Recent Results (from the past 240 hour(s))  Blood Culture (routine x 2)     Status: Abnormal   Collection Time: 09/26/16  6:35 PM  Result Value Ref Range Status   Specimen Description BLOOD RIGHT ANTECUBITAL  Final   Special Requests BOTTLES DRAWN AEROBIC AND ANAEROBIC 5CC  Final   Culture  Setup Time   Final    GRAM NEGATIVE RODS IN BOTH AEROBIC AND ANAEROBIC BOTTLES Organism ID to follow CRITICAL RESULT CALLED TO, READ BACK BY AND VERIFIED WITH: Denton Brick, PHARM AT 0811 ON [id number] BY Rhea Bleacher    Culture ESCHERICHIA COLI (A)  Final   Report Status 09/29/2016 FINAL  Final   Organism ID, Bacteria ESCHERICHIA COLI  Final      Susceptibility   Escherichia coli - MIC*    AMPICILLIN >=32 RESISTANT Resistant     CEFAZOLIN <=4 SENSITIVE Sensitive     CEFEPIME <=1 SENSITIVE Sensitive     CEFTAZIDIME <=1 SENSITIVE Sensitive     CEFTRIAXONE <=1 SENSITIVE Sensitive     CIPROFLOXACIN <=0.25 SENSITIVE Sensitive     GENTAMICIN <=1 SENSITIVE Sensitive     IMIPENEM <=0.25 SENSITIVE Sensitive     TRIMETH/SULFA <=20 SENSITIVE Sensitive     AMPICILLIN/SULBACTAM 16 INTERMEDIATE Intermediate     PIP/TAZO <=4 SENSITIVE Sensitive     Extended ESBL NEGATIVE Sensitive     * ESCHERICHIA COLI  Urine culture     Status: Abnormal   Collection Time: 09/26/16  6:35 PM  Result Value Ref Range Status   Specimen Description URINE, RANDOM  Final   Special Requests NONE  Final   Culture >=100,000 COLONIES/mL ESCHERICHIA COLI (A)  Final   Report Status 09/28/2016 FINAL  Final   Organism ID, Bacteria  ESCHERICHIA COLI (A)  Final      Susceptibility   Escherichia coli - MIC*    AMPICILLIN >=32 RESISTANT Resistant     CEFAZOLIN <=4 SENSITIVE Sensitive     CEFTRIAXONE <=1 SENSITIVE Sensitive     CIPROFLOXACIN <=  0.25 SENSITIVE Sensitive     GENTAMICIN <=1 SENSITIVE Sensitive     IMIPENEM <=0.25 SENSITIVE Sensitive     NITROFURANTOIN <=16 SENSITIVE Sensitive     TRIMETH/SULFA <=20 SENSITIVE Sensitive     AMPICILLIN/SULBACTAM 16 INTERMEDIATE Intermediate     PIP/TAZO <=4 SENSITIVE Sensitive     Extended ESBL NEGATIVE Sensitive     * >=100,000 COLONIES/mL ESCHERICHIA COLI  Blood Culture ID Panel (Reflexed)     Status: Abnormal   Collection Time: 09/26/16  6:35 PM  Result Value Ref Range Status   Enterococcus species NOT DETECTED NOT DETECTED Final   Listeria monocytogenes NOT DETECTED NOT DETECTED Final   Staphylococcus species NOT DETECTED NOT DETECTED Final   Staphylococcus aureus NOT DETECTED NOT DETECTED Final   Streptococcus species NOT DETECTED NOT DETECTED Final   Streptococcus agalactiae NOT DETECTED NOT DETECTED Final   Streptococcus pneumoniae NOT DETECTED NOT DETECTED Final   Streptococcus pyogenes NOT DETECTED NOT DETECTED Final   Acinetobacter baumannii NOT DETECTED NOT DETECTED Final   Enterobacteriaceae species DETECTED (A) NOT DETECTED Final    Comment: CRITICAL RESULT CALLED TO, READ BACK BY AND VERIFIED WITH: G. ABBOTT, PHARM AT 0811 ON [id number] BY S. YARBROUGH    Enterobacter cloacae complex NOT DETECTED NOT DETECTED Final   Escherichia coli DETECTED (A) NOT DETECTED Final    Comment: CRITICAL RESULT CALLED TO, READ BACK BY AND VERIFIED WITH: G. ABBOTT, PHARM AT 0811 ON [id number] BY S. YARBROUGH    Klebsiella oxytoca NOT DETECTED NOT DETECTED Final   Klebsiella pneumoniae NOT DETECTED NOT DETECTED Final   Proteus species NOT DETECTED NOT DETECTED Final   Serratia marcescens NOT DETECTED NOT DETECTED Final   Carbapenem resistance NOT DETECTED NOT DETECTED Final    Haemophilus influenzae NOT DETECTED NOT DETECTED Final   Neisseria meningitidis NOT DETECTED NOT DETECTED Final   Pseudomonas aeruginosa NOT DETECTED NOT DETECTED Final   Candida albicans NOT DETECTED NOT DETECTED Final   Candida glabrata NOT DETECTED NOT DETECTED Final   Candida krusei NOT DETECTED NOT DETECTED Final   Candida parapsilosis NOT DETECTED NOT DETECTED Final   Candida tropicalis NOT DETECTED NOT DETECTED Final  Blood Culture (routine x 2)     Status: None (Preliminary result)   Collection Time: 09/26/16  6:50 PM  Result Value Ref Range Status   Specimen Description BLOOD LEFT ANTECUBITAL  Final   Special Requests BOTTLES DRAWN AEROBIC AND ANAEROBIC 5CC  Final   Culture NO GROWTH 4 DAYS  Final   Report Status PENDING  Incomplete  Culture, blood (routine x 2)     Status: None (Preliminary result)   Collection Time: 09/28/16  5:15 AM  Result Value Ref Range Status   Specimen Description BLOOD RIGHT HAND  Final   Special Requests BOTTLES DRAWN AEROBIC ONLY 6CC  Final   Culture NO GROWTH 2 DAYS  Final   Report Status PENDING  Incomplete  Culture, blood (routine x 2)     Status: None (Preliminary result)   Collection Time: 09/28/16  5:20 AM  Result Value Ref Range Status   Specimen Description BLOOD LEFT HAND  Final   Special Requests IN PEDIATRIC BOTTLE 2CC  Final   Culture NO GROWTH 2 DAYS  Final   Report Status PENDING  Incomplete  Culture, blood (routine x 2)     Status: None (Preliminary result)   Collection Time: 09/28/16  3:35 PM  Result Value Ref Range Status   Specimen Description BLOOD RIGHT HAND  Final  Special Requests IN PEDIATRIC BOTTLE 2CC  Final   Culture NO GROWTH 2 DAYS  Final   Report Status PENDING  Incomplete  Culture, blood (routine x 2)     Status: None (Preliminary result)   Collection Time: 09/28/16  3:40 PM  Result Value Ref Range Status   Specimen Description BLOOD LEFT HAND  Final   Special Requests IN PEDIATRIC BOTTLE 2CC  Final   Culture  NO GROWTH 2 DAYS  Final   Report Status PENDING  Incomplete    RADIOLOGY STUDIES/RESULTS: Dg Chest 2 View  Result Date: 09/26/2016 CLINICAL DATA:  Chest pain for several weeks EXAM: CHEST  2 VIEW COMPARISON:  08/17/2016 FINDINGS: The heart size and mediastinal contours are within normal limits. Both lungs are clear. The visualized skeletal structures are unremarkable. IMPRESSION: No active cardiopulmonary disease. Electronically Signed   By: Inez Catalina M.D.   On: 09/26/2016 20:32   Ct Abdomen Pelvis W Contrast  Result Date: 09/26/2016 CLINICAL DATA:  Mid abdominal pain radiating to his back x1 day. EXAM: CT ABDOMEN AND PELVIS WITH CONTRAST TECHNIQUE: Multidetector CT imaging of the abdomen and pelvis was performed using the standard protocol following bolus administration of intravenous contrast. CONTRAST:  140mL ISOVUE-300 IOPAMIDOL (ISOVUE-300) INJECTION 61% COMPARISON:  08/31/2016 go FINDINGS: Lower chest: There is mild bilateral lower lobe bronchiectasis. Clearing of bibasilar atelectasis and/or pneumonia. Top-normal sized heart without pericardial nor pleural effusions. No pneumothoraces. Hepatobiliary: No space-occupying mass of the liver. Stable 6 mm hypodensity in the left hepatic lobe statistically consistent with a cyst or hemangioma. No intrahepatic ductal dilatation. Tiny dependent gallstones without secondary stoma signs: Cystitis. Pancreas: No pancreatic mass. A few speckled calcifications along the pancreatic tail may be vascular in etiology or related to chronic pancreatitis. No pancreatic ductal dilatation or surrounding inflammatory changes. Spleen: Normal in size without focal abnormality. Adrenals/Urinary Tract: Normal adrenal glands. Multiple hypodense lesions of both kidneys consistent with cysts remain unchanged. The largest are in the upper pole on the right measuring up to 5.7 cm and on the left 3.7 cm. No obstructive uropathy. No nephrolithiasis. No solid enhancing mass  lesions. Mild circumferential thickening of the bladder is again noted which may be related to underdistention and/or cystitis. Stomach/Bowel: The stomach is contracted in appearance. There is moderate colonic stool burden without acute inflammation. Normal-appearing appendix. No small bowel obstruction. Scattered colonic diverticulosis. Vascular/Lymphatic: Aortic atherosclerosis. No enlarged abdominal or pelvic lymph nodes. Reproductive: Irregular enlarged prostate gland containing central zone calcifications are unchanged in appearance. This impresses upon the base of the bladder as before. Other: No free air or free fluid. Musculoskeletal: No acute nor suspicious osseous abnormality. Lower lumbar degenerative facet arthropathy. IMPRESSION: 1. Clearing of bibasilar atelectasis and/or pneumonia since prior exam. Bilateral lower lobe bronchiectasis is incidentally noted. 2. Stable bilateral renal hypodensities consistent with cysts. No obstructive uropathy. 3. Uncomplicated cholelithiasis. 4. Chronic mild circumferential thickening of the urinary bladder as before may be related to cystitis. 5. Stable prostatomegaly. 6. Lower lumbar facet arthropathy. 7. Moderate colonic stool burden. No bowel obstruction or acute inflammation. Electronically Signed   By: Ashley Royalty M.D.   On: 09/26/2016 21:39     LOS: 4 days   Oren Binet, MD  [facility] Pager:[phone]  If 7PM-7AM, please contact night-coverage [email or web address] Password [email or web address] 09/30/2016, 1:54 PM

## 2016-09-30 NOTE — Care Management Important Message (Signed)
Important Message  Patient Details  Name: Charles Parker MRN: [id number] Date of Birth: 1946-05-30   Medicare Important Message Given:  Yes    Orbie Pyo 09/30/2016, 11:16 AM

## 2016-10-01 LAB — CULTURE, BLOOD (ROUTINE X 2): Culture: NO GROWTH

## 2016-10-01 LAB — GLUCOSE, CAPILLARY
GLUCOSE-CAPILLARY: 100 mg/dL — AB (ref 65–99)
GLUCOSE-CAPILLARY: 81 mg/dL (ref 65–99)
GLUCOSE-CAPILLARY: 140 mg/dL — AB (ref 65–99)
Glucose-Capillary: 55 mg/dL — ABNORMAL LOW (ref 65–99)
Glucose-Capillary: 97 mg/dL (ref 65–99)

## 2016-10-01 MED ORDER — DEXTROSE 5 % IV SOLN
2.0000 g | Freq: Two times a day (BID) | INTRAVENOUS | Status: DC
  Administered 2016-10-01 – 2016-10-06 (×10): 2 g via INTRAVENOUS
  Filled 2016-10-01 (×11): qty 2

## 2016-10-01 MED ORDER — DEXTROSE 5 % IV SOLN
2.0000 g | Freq: Every morning | INTRAVENOUS | Status: DC
  Administered 2016-10-01: 2 g via INTRAVENOUS
  Filled 2016-10-01: qty 2

## 2016-10-01 NOTE — Progress Notes (Signed)
    [facility] has been requested to perform a transesophageal echocardiogram on Charles Parker for SBE - scheduled on 10/06/2015 with Bertrum Sol, MD.  After careful review of history and examination, the risks and benefits of transesophageal echocardiogram have been explained including risks of esophageal damage, perforation (1:10,000 risk), bleeding, pharyngeal hematoma as well as other potential complications associated with conscious sedation including aspiration, arrhythmia, respiratory failure and death. Alternatives to treatment were discussed, questions were answered. Patient is willing to proceed.   Murray Hodgkins, NP  10/01/2016 6:04 PM

## 2016-10-01 NOTE — Consult Note (Signed)
[facility]  Total days of antibiotics 6        Day 6 ceftriaxone               Reason for Consult:ecoli discitis    Referring Physician: ghimire  Principal Problem:   Sepsis due to urinary tract infection ([place]) Active Problems:   Hypertension   OSA (obstructive sleep apnea)   Depression with anxiety   DVT, lower extremity, proximal (HCC)   Diabetes mellitus, type II, insulin dependent (HCC)   Sepsis secondary to UTI ([place])   Elevated lactic acid level    HPI: TZIAH BISAILLON is a 70 y.o. male with hx of IDDM, admitted for sepsis due to ecoli uti c/b bacteremia but also c/o back pain. His repeat blood cx have been negative though his imaging of spine suggestive of small epidurat enhancement of 88mm concerning for epidrual abscess. Edema of erector psinae muscle also c/w myositis. Interestingly, his ab/pelvis ct did not suggest pyelonephritis, no nephrlithiasis but has multiple renal cysts.   He was recently admitted in mid Nov for ecoli pyelo with bacteremia.  He is now day 6 of abtx improving since admit, wbc normalizing, no longer febrile. LA improved from 2/6 on admit down to 1. He mainly reports having considerable low back pain    Past Medical History:  Diagnosis Date  . Clotting disorder (HCC)    DVT  . Diabetes mellitus    type II  . Diverticulosis   . DVT (deep venous thrombosis) ([place])   . Glaucoma    no eye drops   . Hepatic cyst   . Hypercholesterolemia   . Hypertension   . Myocardial infarction    very mild long ago   . Renal cyst   . Sleep apnea   . Tubulovillous adenoma     Allergies:  Allergies  Allergen Reactions  . Metformin And Related Diarrhea  . Codeine Rash    All over the body     MEDICATIONS: . acetaminophen  1,000 mg Oral Q8H  . cefTRIAXone (ROCEPHIN)  IV  2 g Intravenous Q12H  . insulin aspart  0-15 Units Subcutaneous TID WC  . insulin aspart  0-5 Units Subcutaneous QHS  . insulin glargine  30 Units  Subcutaneous QHS  . methocarbamol  500 mg Oral TID  . multivitamin with minerals  1 tablet Oral Daily  . polyethylene glycol  17 g Oral Daily  . rivaroxaban  20 mg Oral Q breakfast  . simvastatin  40 mg Oral q1800  . sodium chloride flush  3 mL Intravenous Q12H  . tamsulosin  0.4 mg Oral Daily    Social History  Substance Use Topics  . Smoking status: Never Smoker  . Smokeless tobacco: Never Used  . Alcohol use 0.0 oz/week     Comment: once every two months    Family History  Problem Relation Age of Onset  . Hypertension Mother   . Diabetes Mother   . Cancer Mother     ? location  . Colon cancer Father 3  . Colon polyps Neg Hx     Review of Systems  Constitutional: Negative for fever, chills, diaphoresis, activity change, appetite change, fatigue and unexpected weight change.  HENT: Negative for congestion, sore throat, rhinorrhea, sneezing, trouble swallowing and sinus pressure.  Eyes: Negative for photophobia and visual disturbance.  Respiratory: Negative for cough, chest tightness, shortness of breath, wheezing and stridor.  Cardiovascular: Negative for chest pain, palpitations  and leg swelling.  Gastrointestinal: Negative for nausea, vomiting, abdominal pain, diarrhea, constipation, blood in stool, abdominal distention and anal bleeding.  Genitourinary: Negative for dysuria, hematuria, flank pain and difficulty urinating.  Musculoskeletal: low back pain Skin: Negative for color change, pallor, rash and wound.  Neurological: Negative for dizziness, tremors, weakness and light-headedness.  Hematological: Negative for adenopathy. Does not bruise/bleed easily.  Psychiatric/Behavioral: Negative for behavioral problems, confusion, sleep disturbance, dysphoric mood, decreased concentration and agitation.     OBJECTIVE: Temp:  [98.2 F (36.8 C)-99 F (37.2 C)] 98.3 F (36.8 C) (12/29 1000) Pulse Rate:  [82-94] 82 (12/29 1000) Resp:  [17-18] 18 (12/29 1000) BP:  (117-142)/(73-98) 118/74 (12/29 1000) SpO2:  [97 %-98 %] 97 % (12/29 1000) Weight:  [175 lb 1.6 oz (79.4 kg)] 175 lb 1.6 oz (79.4 kg) (12/28 2112) Physical Exam  Constitutional: He is oriented to person, place, and time. He appears well-developed and well-nourished. No distress.  HENT:  Mouth/Throat: Oropharynx is clear and moist. No oropharyngeal exudate.  Cardiovascular: Normal rate, regular rhythm and normal heart sounds. Exam reveals no gallop and no friction rub.  No murmur heard.  Pulmonary/Chest: Effort normal and breath sounds normal. No respiratory distress. He has no wheezes.  Abdominal: Soft. Bowel sounds are normal. He exhibits no distension. There is no tenderness.  Lymphadenopathy:  He has no cervical adenopathy.  Neurological: He is alert and oriented to person, place, and time.  Skin: Skin is warm and dry. No rash noted. No erythema.  Psychiatric: He has a normal mood and affect. His behavior is normal.    LABS: Results for orders placed or performed during the hospital encounter of 09/26/16 (from the past 48 hour(s))  Glucose, capillary     Status: Abnormal   Collection Time: 09/29/16  4:29 PM  Result Value Ref Range   Glucose-Capillary 137 (H) 65 - 99 mg/dL   Comment 1 Notify RN   Glucose, capillary     Status: Abnormal   Collection Time: 09/29/16  8:26 PM  Result Value Ref Range   Glucose-Capillary 141 (H) 65 - 99 mg/dL  Glucose, capillary     Status: None   Collection Time: 09/30/16  7:35 AM  Result Value Ref Range   Glucose-Capillary 91 65 - 99 mg/dL  Glucose, capillary     Status: Abnormal   Collection Time: 09/30/16 11:46 AM  Result Value Ref Range   Glucose-Capillary 138 (H) 65 - 99 mg/dL  Glucose, capillary     Status: Abnormal   Collection Time: 09/30/16  4:46 PM  Result Value Ref Range   Glucose-Capillary 122 (H) 65 - 99 mg/dL  Glucose, capillary     Status: Abnormal   Collection Time: 09/30/16  9:09 PM  Result Value Ref Range   Glucose-Capillary  122 (H) 65 - 99 mg/dL  Glucose, capillary     Status: Abnormal   Collection Time: 10/01/16  7:51 AM  Result Value Ref Range   Glucose-Capillary 55 (L) 65 - 99 mg/dL  Glucose, capillary     Status: Abnormal   Collection Time: 10/01/16  8:13 AM  Result Value Ref Range   Glucose-Capillary 100 (H) 65 - 99 mg/dL    MICRO: 12/24 blood cx ecoli 12/24 urine cx ecoli 12/26 blood cx x 4 ngtd IMAGING: Mr Lumbar Spine W Wo Contrast  Result Date: 09/30/2016 CLINICAL DATA:  Back pain. Recent sepsis due to pyelonephritis and E coli bacteremia. EXAM: MRI LUMBAR SPINE WITHOUT AND WITH CONTRAST TECHNIQUE: Multiplanar and  multiecho pulse sequences of the lumbar spine were obtained without and with intravenous contrast. CONTRAST:  1mL MULTIHANCE GADOBENATE DIMEGLUMINE 529 MG/ML IV SOLN COMPARISON:  None. FINDINGS: Segmentation:  Normal segmentation. Alignment:  Mild retrolisthesis L1-2 Vertebrae: Negative for fracture or mass. Heterogeneous bone marrow diffusely. Possibly due to anemia. No evidence of discitis. Conus medullaris: Extends to the T12-L1 level and appears normal. Paraspinal and other soft tissues: Bilateral renal cysts. No retroperitoneal mass. Psoas muscles symmetric. There is edema in the paraspinous muscles bilaterally with enhancement suggesting myositis. Disc levels: L1-2: Mild retrolisthesis. Disc and facet degeneration. Mild spinal stenosis. L2-3:  Negative L3-4:  Mild disc and facet degeneration without stenosis L4-5: Diffuse disc bulging. Extensive facet degeneration with bony overgrowth and bilateral facet joint effusions. 5 mm posterior epidural fluid collection is present on the right side probably arising from the facet joint. There is surrounding epidural enhancement above and below the cyst. This could represent a small abscess however could also be noninfected. In addition, there is a 8 mm fluid collection to the left of the L4 spinous process which does not enhance. This could be a  small abscess as well. There is surrounding myositis. L5-S1:  Negative IMPRESSION: Heterogeneous bone marrow with overall benign appearance possibly due to anemia and prominent red marrow Severe facet degeneration at L4-5 with bilateral facet joint effusions. Right posterior epidural fluid collection 5 mm with surrounding epidural enhancement, concerning for epidural abscess versus synovial cyst. 8 mm fluid collection to the left of the L4 spinous process could also represent an abscess although it does not enhance as would be expected. There is edema and enhancement in the erector spinae muscles bilaterally suggesting myositis. Overall the findings are concerning for spinal infection. These results were called by telephone at the time of interpretation on 09/30/2016 at 6:21 pm to Dr. Oren Binet , who verbally acknowledged these results. Electronically Signed   By: Franchot Gallo M.D.   On: 09/30/2016 18:23    Assessment/Plan: 70 yo M with recurrent ecoli bacteremia thought to be due to sepsis of urinary source but also found to have small epidural abscess.  - recommend to continue with ceftriaxone 2gm iv daily, likely need 6 wk iv abtx -  Also would get TEE to see if any signs of endocarditis, since he had recent bacteremia roughly 4 weeks ago.  - will need picc line for prolonged abtx, can place now  - health maintenance - plan to check for hiv and hep c ab

## 2016-10-01 NOTE — Progress Notes (Signed)
PROGRESS NOTE        PATIENT DETAILS Name: Charles Parker Age: 70 y.o. Sex: male Date of Birth: 08-30-1946 Admit Date: 09/26/2016 Admitting Physician Vianne Bulls, MD SS:[id number] [name], MD  Brief Narrative: Patient is a 70 y.o. male with recent hospitalization (11/14-11/17) for sepsis due to pyelonephritis and Escherichia coli bacteremia admitted on 12/24 with fever/back pain/left flank pain-blood cultures again positive for Escherichia coli. Initially thought to have recurrent UTI/pyelonephritis, but since back pain persisted-underwent MRI of the brain that shows epidural abscess/phlegmon/osteomyelitis. See below for further details.   Subjective: Continues to back pain-mid lower back-and left flank pain. Although slightly improved.   Assessment/Plan: Principal Problem: Sepsis due coli bacteremia/pyelonephritis/ L4-L5 epidural abscess with osteomyelitis and phlegmon formation: Sepsis pathophysiology has resolved, continue Rocephin-change to every 12 hours dosing-given MRI LS spine findings. Repeat blood cultures on 12/26 already negative. Given MRI findings, I have consulted infectious disease-await formal consultation, but after speaking with Dr. Baxter Flattery over the phone-TEE has been recommended-this is being tentatively scheduled for 10/05/16  Note:discussed with Dr. Marland Kitchen Ditty over the phone on 12/28-who reviewed the MRI results, and thought that patient probably had osteomyelitis of that area and recommended medical management.  Acute kidney injury: Probably mild prerenal azotemia, resolved.  Insulin-dependent diabetes: CBGs currently stable, continue Lantus 30 units and SSI. Follow  Dyslipidemia: Continue simvastatin  Hypertension:.Pressure appears stable, lisinopril remains on hold-resume prior to discharge.  BPH: Continue Flomax  History of venous thromboembolism: Continue Xarelto-per PCPs note-needs lifelong anticoagulation  DVT  Prophylaxis: Full dose anticoagulation Xarelto  Code Status: Full code   Family Communication: None at bedside  Disposition Plan: Remain inpatient- SNF on discharge-likely early next week  Antimicrobial agents: See below  Procedures: None  CONSULTS:  None  Time spent: 25- minutes-Greater than 50% of this time was spent in counseling, explanation of diagnosis, planning of further management, and coordination of care.  MEDICATIONS: Anti-infectives    Start     Dose/Rate Route Frequency Ordered Stop   10/01/16 2200  cefTRIAXone (ROCEPHIN) 2 g in dextrose 5 % 50 mL IVPB     2 g 100 mL/hr over 30 Minutes Intravenous Every 12 hours 10/01/16 1107     10/01/16 1000  cefTRIAXone (ROCEPHIN) 2 g in dextrose 5 % 50 mL IVPB  Status:  Discontinued     2 g 100 mL/hr over 30 Minutes Intravenous  Every morning - 10a 10/01/16 0931 10/01/16 1107   09/27/16 1800  cefTRIAXone (ROCEPHIN) 2 g in dextrose 5 % 50 mL IVPB  Status:  Discontinued     2 g 100 mL/hr over 30 Minutes Intravenous Every 24 hours 09/26/16 1857 09/26/16 2234   09/27/16 1100  cefTRIAXone (ROCEPHIN) 2 g in dextrose 5 % 50 mL IVPB  Status:  Discontinued     2 g 100 mL/hr over 30 Minutes Intravenous Every 24 hours 09/27/16 1024 10/01/16 0931   09/26/16 2359  ceFEPIme (MAXIPIME) 1 g in dextrose 5 % 50 mL IVPB  Status:  Discontinued     1 g 100 mL/hr over 30 Minutes Intravenous Every 12 hours 09/26/16 2307 09/27/16 1008   09/26/16 2245  ceFEPIme (MAXIPIME) 2 g in dextrose 5 % 50 mL IVPB  Status:  Discontinued     2 g 100 mL/hr over 30 Minutes Intravenous  Once 09/26/16 2233 09/26/16 2306   09/26/16  1845  cefTRIAXone (ROCEPHIN) 2 g in dextrose 5 % 50 mL IVPB     2 g 100 mL/hr over 30 Minutes Intravenous  Once 09/26/16 1835 09/26/16 2030      Scheduled Meds: . acetaminophen  1,000 mg Oral Q8H  . cefTRIAXone (ROCEPHIN)  IV  2 g Intravenous Q12H  . insulin aspart  0-15 Units Subcutaneous TID WC  . insulin aspart  0-5 Units  Subcutaneous QHS  . insulin glargine  30 Units Subcutaneous QHS  . methocarbamol  500 mg Oral TID  . multivitamin with minerals  1 tablet Oral Daily  . polyethylene glycol  17 g Oral Daily  . rivaroxaban  20 mg Oral Q breakfast  . simvastatin  40 mg Oral q1800  . sodium chloride flush  3 mL Intravenous Q12H  . tamsulosin  0.4 mg Oral Daily   Continuous Infusions: PRN Meds:.HYDROcodone-acetaminophen, morphine injection, ondansetron **OR** ondansetron (ZOFRAN) IV   PHYSICAL EXAM: Vital signs: Vitals:   09/30/16 1648 09/30/16 2112 10/01/16 0617 10/01/16 1000  BP: 117/78 (!) 142/98 138/73 118/74  Pulse: 89 94 88 82  Resp: 18 18 17 18   Temp: 99 F (37.2 C) 98.2 F (36.8 C) 98.4 F (36.9 C) 98.3 F (36.8 C)  TempSrc: Oral Oral Oral Oral  SpO2: 97% 98% 98% 97%  Weight:  79.4 kg (175 lb 1.6 oz)    Height:       Filed Weights   09/28/16 0500 09/29/16 2023 09/30/16 2112  Weight: 78.4 kg (172 lb 13.5 oz) 84.3 kg (185 lb 13.6 oz) 79.4 kg (175 lb 1.6 oz)   Body mass index is 23.75 kg/m.   General appearance :Awake, alert, not in any distress. Speech Clear. Not toxic Looking Eyes:, pupils equally reactive to light and accomodation,no scleral icterus.Pink conjunctiva HEENT: Atraumatic and Normocephalic Neck: supple, no JVD. No cervical lymphadenopathy. No thyromegaly Resp:Good air entry bilaterally, no added sounds  CVS: S1 S2 regular, no murmurs.  GI: Bowel sounds present, Non tender and not distended with no gaurding, rigidity or rebound.No organomegaly.Left CVA angle is tender Extremities: B/L Lower Ext shows no edema, both legs are warm to touch Neurology:  speech clear,Non focal, sensation is grossly intact. Psychiatric: Alert and oriented x 3. Normal mood. Musculoskeletal:.No digital cyanosis Skin:No Rash, warm and dry Wounds:N/A  I have personally reviewed following labs and imaging studies  LABORATORY DATA: CBC:  Recent Labs Lab 09/26/16 1835 09/27/16 0355  09/28/16 0516  WBC 19.6* 17.3* 9.7  NEUTROABS  --  14.6* 7.1  HGB 11.2* 9.2* 10.1*  HCT 34.2* 27.8* 31.6*  MCV 88.1 87.7 88.8  PLT 307 239 [id number]    Basic Metabolic Panel:  Recent Labs Lab 09/26/16 1835 09/27/16 0355 09/28/16 0516  NA 135 137 138  K 4.0 4.2 4.2  CL 100* 107 108  CO2 21* 25 24  GLUCOSE 197* 161* 98  BUN 15 17 10   CREATININE 1.41* 0.94 0.81  CALCIUM 9.2 8.1* 8.7*  MG  --   --  1.6*    GFR: Estimated Creatinine Clearance: 93.1 mL/min (by C-G formula based on SCr of 0.81 mg/dL).  Liver Function Tests:  Recent Labs Lab 09/26/16 1835 09/28/16 0516  AST 32 21  ALT 21 15*  ALKPHOS 74 58  BILITOT 0.8 0.2*  PROT 8.5* 6.8  ALBUMIN 2.9* 2.3*   No results for input(s): LIPASE, AMYLASE in the last 168 hours. No results for input(s): AMMONIA in the last 168 hours.  Coagulation Profile:  Recent  Labs Lab 09/26/16 2228  INR 1.37    Cardiac Enzymes:  Recent Labs Lab 09/28/16 1542 09/28/16 2108 09/29/16 0529  TROPONINI <0.03 0.04* <0.03    BNP (last 3 results) No results for input(s): PROBNP in the last 8760 hours.  HbA1C: No results for input(s): HGBA1C in the last 72 hours.  CBG:  Recent Labs Lab 09/30/16 1646 09/30/16 2109 10/01/16 0751 10/01/16 0813 10/01/16 1231  GLUCAP 122* 122* 55* 100* 81    Lipid Profile: No results for input(s): CHOL, HDL, LDLCALC, TRIG, CHOLHDL, LDLDIRECT in the last 72 hours.  Thyroid Function Tests: No results for input(s): TSH, T4TOTAL, FREET4, T3FREE, THYROIDAB in the last 72 hours.  Anemia Panel: No results for input(s): VITAMINB12, FOLATE, FERRITIN, TIBC, IRON, RETICCTPCT in the last 72 hours.  Urine analysis:    Component Value Date/Time   COLORURINE AMBER (A) 09/26/2016 1835   APPEARANCEUR CLOUDY (A) 09/26/2016 1835   LABSPEC 1.018 09/26/2016 1835   PHURINE 5.0 09/26/2016 1835   GLUCOSEU NEGATIVE 09/26/2016 1835   GLUCOSEU NEGATIVE 09/08/2016 1513   HGBUR LARGE (A) 09/26/2016 1835    BILIRUBINUR NEGATIVE 09/26/2016 1835   BILIRUBINUR negative 08/17/2016 1359   KETONESUR 20 (A) 09/26/2016 1835   PROTEINUR 30 (A) 09/26/2016 1835   UROBILINOGEN 0.2 09/08/2016 1513   NITRITE POSITIVE (A) 09/26/2016 1835   LEUKOCYTESUR LARGE (A) 09/26/2016 1835    Sepsis Labs: Lactic Acid, Venous    Component Value Date/Time   LATICACIDVEN 1.0 09/27/2016 0355    MICROBIOLOGY: Recent Results (from the past 240 hour(s))  Blood Culture (routine x 2)     Status: Abnormal   Collection Time: 09/26/16  6:35 PM  Result Value Ref Range Status   Specimen Description BLOOD RIGHT ANTECUBITAL  Final   Special Requests BOTTLES DRAWN AEROBIC AND ANAEROBIC 5CC  Final   Culture  Setup Time   Final    GRAM NEGATIVE RODS IN BOTH AEROBIC AND ANAEROBIC BOTTLES Organism ID to follow CRITICAL RESULT CALLED TO, READ BACK BY AND VERIFIED WITH: Denton Brick, PHARM AT 0811 ON [id number] BY Rhea Bleacher    Culture ESCHERICHIA COLI (A)  Final   Report Status 09/29/2016 FINAL  Final   Organism ID, Bacteria ESCHERICHIA COLI  Final      Susceptibility   Escherichia coli - MIC*    AMPICILLIN >=32 RESISTANT Resistant     CEFAZOLIN <=4 SENSITIVE Sensitive     CEFEPIME <=1 SENSITIVE Sensitive     CEFTAZIDIME <=1 SENSITIVE Sensitive     CEFTRIAXONE <=1 SENSITIVE Sensitive     CIPROFLOXACIN <=0.25 SENSITIVE Sensitive     GENTAMICIN <=1 SENSITIVE Sensitive     IMIPENEM <=0.25 SENSITIVE Sensitive     TRIMETH/SULFA <=20 SENSITIVE Sensitive     AMPICILLIN/SULBACTAM 16 INTERMEDIATE Intermediate     PIP/TAZO <=4 SENSITIVE Sensitive     Extended ESBL NEGATIVE Sensitive     * ESCHERICHIA COLI  Urine culture     Status: Abnormal   Collection Time: 09/26/16  6:35 PM  Result Value Ref Range Status   Specimen Description URINE, RANDOM  Final   Special Requests NONE  Final   Culture >=100,000 COLONIES/mL ESCHERICHIA COLI (A)  Final   Report Status 09/28/2016 FINAL  Final   Organism ID, Bacteria ESCHERICHIA COLI (A)   Final      Susceptibility   Escherichia coli - MIC*    AMPICILLIN >=32 RESISTANT Resistant     CEFAZOLIN <=4 SENSITIVE Sensitive     CEFTRIAXONE <=1 SENSITIVE  Sensitive     CIPROFLOXACIN <=0.25 SENSITIVE Sensitive     GENTAMICIN <=1 SENSITIVE Sensitive     IMIPENEM <=0.25 SENSITIVE Sensitive     NITROFURANTOIN <=16 SENSITIVE Sensitive     TRIMETH/SULFA <=20 SENSITIVE Sensitive     AMPICILLIN/SULBACTAM 16 INTERMEDIATE Intermediate     PIP/TAZO <=4 SENSITIVE Sensitive     Extended ESBL NEGATIVE Sensitive     * >=100,000 COLONIES/mL ESCHERICHIA COLI  Blood Culture ID Panel (Reflexed)     Status: Abnormal   Collection Time: 09/26/16  6:35 PM  Result Value Ref Range Status   Enterococcus species NOT DETECTED NOT DETECTED Final   Listeria monocytogenes NOT DETECTED NOT DETECTED Final   Staphylococcus species NOT DETECTED NOT DETECTED Final   Staphylococcus aureus NOT DETECTED NOT DETECTED Final   Streptococcus species NOT DETECTED NOT DETECTED Final   Streptococcus agalactiae NOT DETECTED NOT DETECTED Final   Streptococcus pneumoniae NOT DETECTED NOT DETECTED Final   Streptococcus pyogenes NOT DETECTED NOT DETECTED Final   Acinetobacter baumannii NOT DETECTED NOT DETECTED Final   Enterobacteriaceae species DETECTED (A) NOT DETECTED Final    Comment: CRITICAL RESULT CALLED TO, READ BACK BY AND VERIFIED WITH: G. ABBOTT, PHARM AT 0811 ON [id number] BY S. YARBROUGH    Enterobacter cloacae complex NOT DETECTED NOT DETECTED Final   Escherichia coli DETECTED (A) NOT DETECTED Final    Comment: CRITICAL RESULT CALLED TO, READ BACK BY AND VERIFIED WITH: G. ABBOTT, PHARM AT 0811 ON [id number] BY S. YARBROUGH    Klebsiella oxytoca NOT DETECTED NOT DETECTED Final   Klebsiella pneumoniae NOT DETECTED NOT DETECTED Final   Proteus species NOT DETECTED NOT DETECTED Final   Serratia marcescens NOT DETECTED NOT DETECTED Final   Carbapenem resistance NOT DETECTED NOT DETECTED Final   Haemophilus influenzae  NOT DETECTED NOT DETECTED Final   Neisseria meningitidis NOT DETECTED NOT DETECTED Final   Pseudomonas aeruginosa NOT DETECTED NOT DETECTED Final   Candida albicans NOT DETECTED NOT DETECTED Final   Candida glabrata NOT DETECTED NOT DETECTED Final   Candida krusei NOT DETECTED NOT DETECTED Final   Candida parapsilosis NOT DETECTED NOT DETECTED Final   Candida tropicalis NOT DETECTED NOT DETECTED Final  Blood Culture (routine x 2)     Status: None (Preliminary result)   Collection Time: 09/26/16  6:50 PM  Result Value Ref Range Status   Specimen Description BLOOD LEFT ANTECUBITAL  Final   Special Requests BOTTLES DRAWN AEROBIC AND ANAEROBIC 5CC  Final   Culture NO GROWTH 4 DAYS  Final   Report Status PENDING  Incomplete  Culture, blood (routine x 2)     Status: None (Preliminary result)   Collection Time: 09/28/16  5:15 AM  Result Value Ref Range Status   Specimen Description BLOOD RIGHT HAND  Final   Special Requests BOTTLES DRAWN AEROBIC ONLY 6CC  Final   Culture NO GROWTH 2 DAYS  Final   Report Status PENDING  Incomplete  Culture, blood (routine x 2)     Status: None (Preliminary result)   Collection Time: 09/28/16  5:20 AM  Result Value Ref Range Status   Specimen Description BLOOD LEFT HAND  Final   Special Requests IN PEDIATRIC BOTTLE 2CC  Final   Culture NO GROWTH 2 DAYS  Final   Report Status PENDING  Incomplete  Culture, blood (routine x 2)     Status: None (Preliminary result)   Collection Time: 09/28/16  3:35 PM  Result Value Ref Range Status   Specimen  Description BLOOD RIGHT HAND  Final   Special Requests IN PEDIATRIC BOTTLE 2CC  Final   Culture NO GROWTH 2 DAYS  Final   Report Status PENDING  Incomplete  Culture, blood (routine x 2)     Status: None (Preliminary result)   Collection Time: 09/28/16  3:40 PM  Result Value Ref Range Status   Specimen Description BLOOD LEFT HAND  Final   Special Requests IN PEDIATRIC BOTTLE 2CC  Final   Culture NO GROWTH 2 DAYS   Final   Report Status PENDING  Incomplete    RADIOLOGY STUDIES/RESULTS: Dg Chest 2 View  Result Date: 09/26/2016 CLINICAL DATA:  Chest pain for several weeks EXAM: CHEST  2 VIEW COMPARISON:  08/17/2016 FINDINGS: The heart size and mediastinal contours are within normal limits. Both lungs are clear. The visualized skeletal structures are unremarkable. IMPRESSION: No active cardiopulmonary disease. Electronically Signed   By: Inez Catalina M.D.   On: 09/26/2016 20:32   Mr Lumbar Spine W Wo Contrast  Result Date: 09/30/2016 CLINICAL DATA:  Back pain. Recent sepsis due to pyelonephritis and E coli bacteremia. EXAM: MRI LUMBAR SPINE WITHOUT AND WITH CONTRAST TECHNIQUE: Multiplanar and multiecho pulse sequences of the lumbar spine were obtained without and with intravenous contrast. CONTRAST:  65mL MULTIHANCE GADOBENATE DIMEGLUMINE 529 MG/ML IV SOLN COMPARISON:  None. FINDINGS: Segmentation:  Normal segmentation. Alignment:  Mild retrolisthesis L1-2 Vertebrae: Negative for fracture or mass. Heterogeneous bone marrow diffusely. Possibly due to anemia. No evidence of discitis. Conus medullaris: Extends to the T12-L1 level and appears normal. Paraspinal and other soft tissues: Bilateral renal cysts. No retroperitoneal mass. Psoas muscles symmetric. There is edema in the paraspinous muscles bilaterally with enhancement suggesting myositis. Disc levels: L1-2: Mild retrolisthesis. Disc and facet degeneration. Mild spinal stenosis. L2-3:  Negative L3-4:  Mild disc and facet degeneration without stenosis L4-5: Diffuse disc bulging. Extensive facet degeneration with bony overgrowth and bilateral facet joint effusions. 5 mm posterior epidural fluid collection is present on the right side probably arising from the facet joint. There is surrounding epidural enhancement above and below the cyst. This could represent a small abscess however could also be noninfected. In addition, there is a 8 mm fluid collection to the left  of the L4 spinous process which does not enhance. This could be a small abscess as well. There is surrounding myositis. L5-S1:  Negative IMPRESSION: Heterogeneous bone marrow with overall benign appearance possibly due to anemia and prominent red marrow Severe facet degeneration at L4-5 with bilateral facet joint effusions. Right posterior epidural fluid collection 5 mm with surrounding epidural enhancement, concerning for epidural abscess versus synovial cyst. 8 mm fluid collection to the left of the L4 spinous process could also represent an abscess although it does not enhance as would be expected. There is edema and enhancement in the erector spinae muscles bilaterally suggesting myositis. Overall the findings are concerning for spinal infection. These results were called by telephone at the time of interpretation on 09/30/2016 at 6:21 pm to Dr. Oren Binet , who verbally acknowledged these results. Electronically Signed   By: Franchot Gallo M.D.   On: 09/30/2016 18:23   Ct Abdomen Pelvis W Contrast  Result Date: 09/26/2016 CLINICAL DATA:  Mid abdominal pain radiating to his back x1 day. EXAM: CT ABDOMEN AND PELVIS WITH CONTRAST TECHNIQUE: Multidetector CT imaging of the abdomen and pelvis was performed using the standard protocol following bolus administration of intravenous contrast. CONTRAST:  127mL ISOVUE-300 IOPAMIDOL (ISOVUE-300) INJECTION 61% COMPARISON:  08/31/2016 go FINDINGS: Lower chest: There is mild bilateral lower lobe bronchiectasis. Clearing of bibasilar atelectasis and/or pneumonia. Top-normal sized heart without pericardial nor pleural effusions. No pneumothoraces. Hepatobiliary: No space-occupying mass of the liver. Stable 6 mm hypodensity in the left hepatic lobe statistically consistent with a cyst or hemangioma. No intrahepatic ductal dilatation. Tiny dependent gallstones without secondary stoma signs: Cystitis. Pancreas: No pancreatic mass. A few speckled calcifications along the  pancreatic tail may be vascular in etiology or related to chronic pancreatitis. No pancreatic ductal dilatation or surrounding inflammatory changes. Spleen: Normal in size without focal abnormality. Adrenals/Urinary Tract: Normal adrenal glands. Multiple hypodense lesions of both kidneys consistent with cysts remain unchanged. The largest are in the upper pole on the right measuring up to 5.7 cm and on the left 3.7 cm. No obstructive uropathy. No nephrolithiasis. No solid enhancing mass lesions. Mild circumferential thickening of the bladder is again noted which may be related to underdistention and/or cystitis. Stomach/Bowel: The stomach is contracted in appearance. There is moderate colonic stool burden without acute inflammation. Normal-appearing appendix. No small bowel obstruction. Scattered colonic diverticulosis. Vascular/Lymphatic: Aortic atherosclerosis. No enlarged abdominal or pelvic lymph nodes. Reproductive: Irregular enlarged prostate gland containing central zone calcifications are unchanged in appearance. This impresses upon the base of the bladder as before. Other: No free air or free fluid. Musculoskeletal: No acute nor suspicious osseous abnormality. Lower lumbar degenerative facet arthropathy. IMPRESSION: 1. Clearing of bibasilar atelectasis and/or pneumonia since prior exam. Bilateral lower lobe bronchiectasis is incidentally noted. 2. Stable bilateral renal hypodensities consistent with cysts. No obstructive uropathy. 3. Uncomplicated cholelithiasis. 4. Chronic mild circumferential thickening of the urinary bladder as before may be related to cystitis. 5. Stable prostatomegaly. 6. Lower lumbar facet arthropathy. 7. Moderate colonic stool burden. No bowel obstruction or acute inflammation. Electronically Signed   By: Ashley Royalty M.D.   On: 09/26/2016 21:39     LOS: 5 days   Oren Binet, MD  [facility] Pager:[phone]  If 7PM-7AM, please contact  night-coverage [email or web address] Password [email or web address] 10/01/2016, 1:53 PM

## 2016-10-02 DIAGNOSIS — R7881 Bacteremia: Secondary | ICD-10-CM

## 2016-10-02 LAB — GLUCOSE, CAPILLARY
GLUCOSE-CAPILLARY: 56 mg/dL — AB (ref 65–99)
GLUCOSE-CAPILLARY: 111 mg/dL — AB (ref 65–99)
GLUCOSE-CAPILLARY: 150 mg/dL — AB (ref 65–99)
Glucose-Capillary: 114 mg/dL — ABNORMAL HIGH (ref 65–99)
Glucose-Capillary: 152 mg/dL — ABNORMAL HIGH (ref 65–99)

## 2016-10-02 MED ORDER — INSULIN GLARGINE 100 UNIT/ML ~~LOC~~ SOLN
25.0000 [IU] | Freq: Every day | SUBCUTANEOUS | Status: DC
  Administered 2016-10-02 – 2016-10-05 (×4): 25 [IU] via SUBCUTANEOUS
  Filled 2016-10-02 (×5): qty 0.25

## 2016-10-02 MED ORDER — OXYCODONE-ACETAMINOPHEN 5-325 MG PO TABS
1.0000 | ORAL_TABLET | Freq: Four times a day (QID) | ORAL | Status: DC | PRN
  Administered 2016-10-05: 1 via ORAL
  Filled 2016-10-02: qty 1

## 2016-10-02 NOTE — Progress Notes (Signed)
Patient refusing the use of CPAP at this time. 

## 2016-10-02 NOTE — Progress Notes (Addendum)
PROGRESS NOTE        PATIENT DETAILS Name: Charles Parker Age: 70 y.o. Sex: male Date of Birth: 08/05/1946 Admit Date: 09/26/2016 Admitting Physician Vianne Bulls, MD [id number] [name], MD  Brief Narrative: Patient is a 70 y.o. male with recent hospitalization (11/14-11/17) for sepsis due to pyelonephritis and Escherichia coli bacteremia admitted on 12/24 with fever/back pain/left flank pain-blood cultures again positive for Escherichia coli. Initially thought to have recurrent UTI/pyelonephritis, but since back pain persisted-underwent MRI of the brain that shows epidural abscess/phlegmon/osteomyelitis. See below for further details.   Subjective: Back pain continues-patient appears comfortable.  Assessment/Plan: Principal Problem: Sepsis due coli bacteremia/pyelonephritis/ L4-L5 epidural abscess with osteomyelitis and phlegmon formation: Sepsis pathophysiology has resolved, continue Rocephin. Repeat blood cultures on 12/26 already negative. Given MRI findings, infectious disease was consulted, recommendations are to continue Rocephin and proceed with a TEE that has been tentatively scheduled for 10/05/16.   Note:discussed with neurosurgeon- Dr. Marland Kitchen Ditty over the phone on 12/28-who reviewed the MRI results, and thought that patient probably had osteomyelitis of the area noted above and recommended medical management.  Acute kidney injury: Probably mild prerenal azotemia, resolved.  Insulin-dependent diabetes: Hypoglycemic episode earlier this morning-decrease Lantus to 25 units, continue SSI. Follow.   Dyslipidemia: Continue simvastatin  Hypertension:Pressure appears stable, lisinopril remains on hold-resume prior to discharge.  BPH: Continue Flomax  History of venous thromboembolism: Continue Xarelto-per PCPs note-needs lifelong anticoagulation  DVT Prophylaxis: Full dose anticoagulation Xarelto  Code Status: Full code   Family  Communication: None at bedside  Disposition Plan: Remain inpatient- SNF on discharge-likely early next week  Antimicrobial agents: See below  Procedures: None  CONSULTS:  None  Time spent: 25- minutes-Greater than 50% of this time was spent in counseling, explanation of diagnosis, planning of further management, and coordination of care.  MEDICATIONS: Anti-infectives    Start     Dose/Rate Route Frequency Ordered Stop   10/01/16 2200  cefTRIAXone (ROCEPHIN) 2 g in dextrose 5 % 50 mL IVPB     2 g 100 mL/hr over 30 Minutes Intravenous Every 12 hours 10/01/16 1107     10/01/16 1000  cefTRIAXone (ROCEPHIN) 2 g in dextrose 5 % 50 mL IVPB  Status:  Discontinued     2 g 100 mL/hr over 30 Minutes Intravenous  Every morning - 10a 10/01/16 0931 10/01/16 1107   09/27/16 1800  cefTRIAXone (ROCEPHIN) 2 g in dextrose 5 % 50 mL IVPB  Status:  Discontinued     2 g 100 mL/hr over 30 Minutes Intravenous Every 24 hours 09/26/16 1857 09/26/16 2234   09/27/16 1100  cefTRIAXone (ROCEPHIN) 2 g in dextrose 5 % 50 mL IVPB  Status:  Discontinued     2 g 100 mL/hr over 30 Minutes Intravenous Every 24 hours 09/27/16 1024 10/01/16 0931   09/26/16 2359  ceFEPIme (MAXIPIME) 1 g in dextrose 5 % 50 mL IVPB  Status:  Discontinued     1 g 100 mL/hr over 30 Minutes Intravenous Every 12 hours 09/26/16 2307 09/27/16 1008   09/26/16 2245  ceFEPIme (MAXIPIME) 2 g in dextrose 5 % 50 mL IVPB  Status:  Discontinued     2 g 100 mL/hr over 30 Minutes Intravenous  Once 09/26/16 2233 09/26/16 2306   09/26/16 1845  cefTRIAXone (ROCEPHIN) 2 g in dextrose 5 % 50 mL IVPB  2 g 100 mL/hr over 30 Minutes Intravenous  Once 09/26/16 1835 09/26/16 2030      Scheduled Meds: . acetaminophen  1,000 mg Oral Q8H  . cefTRIAXone (ROCEPHIN)  IV  2 g Intravenous Q12H  . insulin aspart  0-15 Units Subcutaneous TID WC  . insulin aspart  0-5 Units Subcutaneous QHS  . insulin glargine  30 Units Subcutaneous QHS  . methocarbamol   500 mg Oral TID  . multivitamin with minerals  1 tablet Oral Daily  . polyethylene glycol  17 g Oral Daily  . rivaroxaban  20 mg Oral Q breakfast  . simvastatin  40 mg Oral q1800  . sodium chloride flush  3 mL Intravenous Q12H  . tamsulosin  0.4 mg Oral Daily   Continuous Infusions: PRN Meds:.HYDROcodone-acetaminophen, morphine injection, ondansetron **OR** ondansetron (ZOFRAN) IV   PHYSICAL EXAM: Vital signs: Vitals:   10/01/16 1630 10/01/16 2121 10/02/16 0543 10/02/16 0945  BP: (!) 142/87 (!) 142/88 (!) 145/80 (!) 146/97  Pulse: 95 87 78 89  Resp: 18 18 16 17   Temp: 98.9 F (37.2 C) 99.3 F (37.4 C) 99 F (37.2 C) 98.7 F (37.1 C)  TempSrc: Oral Oral  Oral  SpO2: 99% 96% 95% 95%  Weight:      Height:       Filed Weights   09/28/16 0500 09/29/16 2023 09/30/16 2112  Weight: 78.4 kg (172 lb 13.5 oz) 84.3 kg (185 lb 13.6 oz) 79.4 kg (175 lb 1.6 oz)   Body mass index is 23.75 kg/m.   General appearance :Awake, alert, not in any distress. Speech Clear. Not toxic Looking Eyes:, pupils equally reactive to light and accomodation,no scleral icterus.Pink conjunctiva HEENT: Atraumatic and Normocephalic Neck: supple, no JVD. No cervical lymphadenopathy. No thyromegaly Resp:Good air entry bilaterally, no added sounds  CVS: S1 S2 regular, no murmurs.  GI: Bowel sounds present, Non tender and not distended with no gaurding, rigidity or rebound.No organomegaly.Left CVA angle is tender Extremities: B/L Lower Ext shows no edema, both legs are warm to touch Neurology:  speech clear,Non focal, sensation is grossly intact. Psychiatric: Alert and oriented x 3. Normal mood. Musculoskeletal:.No digital cyanosis Skin:No Rash, warm and dry Wounds:N/A  I have personally reviewed following labs and imaging studies  LABORATORY DATA: CBC:  Recent Labs Lab 09/26/16 1835 09/27/16 0355 09/28/16 0516  WBC 19.6* 17.3* 9.7  NEUTROABS  --  14.6* 7.1  HGB 11.2* 9.2* 10.1*  HCT 34.2* 27.8*  31.6*  MCV 88.1 87.7 88.8  PLT 307 239 [id number]    Basic Metabolic Panel:  Recent Labs Lab 09/26/16 1835 09/27/16 0355 09/28/16 0516  NA 135 137 138  K 4.0 4.2 4.2  CL 100* 107 108  CO2 21* 25 24  GLUCOSE 197* 161* 98  BUN 15 17 10   CREATININE 1.41* 0.94 0.81  CALCIUM 9.2 8.1* 8.7*  MG  --   --  1.6*    GFR: Estimated Creatinine Clearance: 93.1 mL/min (by C-G formula based on SCr of 0.81 mg/dL).  Liver Function Tests:  Recent Labs Lab 09/26/16 1835 09/28/16 0516  AST 32 21  ALT 21 15*  ALKPHOS 74 58  BILITOT 0.8 0.2*  PROT 8.5* 6.8  ALBUMIN 2.9* 2.3*   No results for input(s): LIPASE, AMYLASE in the last 168 hours. No results for input(s): AMMONIA in the last 168 hours.  Coagulation Profile:  Recent Labs Lab 09/26/16 2228  INR 1.37    Cardiac Enzymes:  Recent Labs Lab 09/28/16 1542 09/28/16  2108 09/29/16 0529  TROPONINI <0.03 0.04* <0.03    BNP (last 3 results) No results for input(s): PROBNP in the last 8760 hours.  HbA1C: No results for input(s): HGBA1C in the last 72 hours.  CBG:  Recent Labs Lab 10/01/16 1638 10/01/16 2119 10/02/16 0735 10/02/16 0901 10/02/16 1147  GLUCAP 97 140* 56* 111* 150*    Lipid Profile: No results for input(s): CHOL, HDL, LDLCALC, TRIG, CHOLHDL, LDLDIRECT in the last 72 hours.  Thyroid Function Tests: No results for input(s): TSH, T4TOTAL, FREET4, T3FREE, THYROIDAB in the last 72 hours.  Anemia Panel: No results for input(s): VITAMINB12, FOLATE, FERRITIN, TIBC, IRON, RETICCTPCT in the last 72 hours.  Urine analysis:    Component Value Date/Time   COLORURINE AMBER (A) 09/26/2016 1835   APPEARANCEUR CLOUDY (A) 09/26/2016 1835   LABSPEC 1.018 09/26/2016 1835   PHURINE 5.0 09/26/2016 1835   GLUCOSEU NEGATIVE 09/26/2016 1835   GLUCOSEU NEGATIVE 09/08/2016 1513   HGBUR LARGE (A) 09/26/2016 1835   BILIRUBINUR NEGATIVE 09/26/2016 1835   BILIRUBINUR negative 08/17/2016 1359   KETONESUR 20 (A) 09/26/2016  1835   PROTEINUR 30 (A) 09/26/2016 1835   UROBILINOGEN 0.2 09/08/2016 1513   NITRITE POSITIVE (A) 09/26/2016 1835   LEUKOCYTESUR LARGE (A) 09/26/2016 1835    Sepsis Labs: Lactic Acid, Venous    Component Value Date/Time   LATICACIDVEN 1.0 09/27/2016 0355    MICROBIOLOGY: Recent Results (from the past 240 hour(s))  Blood Culture (routine x 2)     Status: Abnormal   Collection Time: 09/26/16  6:35 PM  Result Value Ref Range Status   Specimen Description BLOOD RIGHT ANTECUBITAL  Final   Special Requests BOTTLES DRAWN AEROBIC AND ANAEROBIC 5CC  Final   Culture  Setup Time   Final    GRAM NEGATIVE RODS IN BOTH AEROBIC AND ANAEROBIC BOTTLES Organism ID to follow CRITICAL RESULT CALLED TO, READ BACK BY AND VERIFIED WITH: Denton Brick, PHARM AT 0811 ON [id number] BY Rhea Bleacher    Culture ESCHERICHIA COLI (A)  Final   Report Status 09/29/2016 FINAL  Final   Organism ID, Bacteria ESCHERICHIA COLI  Final      Susceptibility   Escherichia coli - MIC*    AMPICILLIN >=32 RESISTANT Resistant     CEFAZOLIN <=4 SENSITIVE Sensitive     CEFEPIME <=1 SENSITIVE Sensitive     CEFTAZIDIME <=1 SENSITIVE Sensitive     CEFTRIAXONE <=1 SENSITIVE Sensitive     CIPROFLOXACIN <=0.25 SENSITIVE Sensitive     GENTAMICIN <=1 SENSITIVE Sensitive     IMIPENEM <=0.25 SENSITIVE Sensitive     TRIMETH/SULFA <=20 SENSITIVE Sensitive     AMPICILLIN/SULBACTAM 16 INTERMEDIATE Intermediate     PIP/TAZO <=4 SENSITIVE Sensitive     Extended ESBL NEGATIVE Sensitive     * ESCHERICHIA COLI  Urine culture     Status: Abnormal   Collection Time: 09/26/16  6:35 PM  Result Value Ref Range Status   Specimen Description URINE, RANDOM  Final   Special Requests NONE  Final   Culture >=100,000 COLONIES/mL ESCHERICHIA COLI (A)  Final   Report Status 09/28/2016 FINAL  Final   Organism ID, Bacteria ESCHERICHIA COLI (A)  Final      Susceptibility   Escherichia coli - MIC*    AMPICILLIN >=32 RESISTANT Resistant     CEFAZOLIN <=4  SENSITIVE Sensitive     CEFTRIAXONE <=1 SENSITIVE Sensitive     CIPROFLOXACIN <=0.25 SENSITIVE Sensitive     GENTAMICIN <=1 SENSITIVE Sensitive  IMIPENEM <=0.25 SENSITIVE Sensitive     NITROFURANTOIN <=16 SENSITIVE Sensitive     TRIMETH/SULFA <=20 SENSITIVE Sensitive     AMPICILLIN/SULBACTAM 16 INTERMEDIATE Intermediate     PIP/TAZO <=4 SENSITIVE Sensitive     Extended ESBL NEGATIVE Sensitive     * >=100,000 COLONIES/mL ESCHERICHIA COLI  Blood Culture ID Panel (Reflexed)     Status: Abnormal   Collection Time: 09/26/16  6:35 PM  Result Value Ref Range Status   Enterococcus species NOT DETECTED NOT DETECTED Final   Listeria monocytogenes NOT DETECTED NOT DETECTED Final   Staphylococcus species NOT DETECTED NOT DETECTED Final   Staphylococcus aureus NOT DETECTED NOT DETECTED Final   Streptococcus species NOT DETECTED NOT DETECTED Final   Streptococcus agalactiae NOT DETECTED NOT DETECTED Final   Streptococcus pneumoniae NOT DETECTED NOT DETECTED Final   Streptococcus pyogenes NOT DETECTED NOT DETECTED Final   Acinetobacter baumannii NOT DETECTED NOT DETECTED Final   Enterobacteriaceae species DETECTED (A) NOT DETECTED Final    Comment: CRITICAL RESULT CALLED TO, READ BACK BY AND VERIFIED WITH: G. ABBOTT, PHARM AT 0811 ON [id number] BY S. YARBROUGH    Enterobacter cloacae complex NOT DETECTED NOT DETECTED Final   Escherichia coli DETECTED (A) NOT DETECTED Final    Comment: CRITICAL RESULT CALLED TO, READ BACK BY AND VERIFIED WITH: G. ABBOTT, PHARM AT 0811 ON [id number] BY S. YARBROUGH    Klebsiella oxytoca NOT DETECTED NOT DETECTED Final   Klebsiella pneumoniae NOT DETECTED NOT DETECTED Final   Proteus species NOT DETECTED NOT DETECTED Final   Serratia marcescens NOT DETECTED NOT DETECTED Final   Carbapenem resistance NOT DETECTED NOT DETECTED Final   Haemophilus influenzae NOT DETECTED NOT DETECTED Final   Neisseria meningitidis NOT DETECTED NOT DETECTED Final   Pseudomonas aeruginosa  NOT DETECTED NOT DETECTED Final   Candida albicans NOT DETECTED NOT DETECTED Final   Candida glabrata NOT DETECTED NOT DETECTED Final   Candida krusei NOT DETECTED NOT DETECTED Final   Candida parapsilosis NOT DETECTED NOT DETECTED Final   Candida tropicalis NOT DETECTED NOT DETECTED Final  Blood Culture (routine x 2)     Status: None   Collection Time: 09/26/16  6:50 PM  Result Value Ref Range Status   Specimen Description BLOOD LEFT ANTECUBITAL  Final   Special Requests BOTTLES DRAWN AEROBIC AND ANAEROBIC 5CC  Final   Culture NO GROWTH 5 DAYS  Final   Report Status 10/01/2016 FINAL  Final  Culture, blood (routine x 2)     Status: None (Preliminary result)   Collection Time: 09/28/16  5:15 AM  Result Value Ref Range Status   Specimen Description BLOOD RIGHT HAND  Final   Special Requests BOTTLES DRAWN AEROBIC ONLY 6CC  Final   Culture NO GROWTH 4 DAYS  Final   Report Status PENDING  Incomplete  Culture, blood (routine x 2)     Status: None (Preliminary result)   Collection Time: 09/28/16  5:20 AM  Result Value Ref Range Status   Specimen Description BLOOD LEFT HAND  Final   Special Requests IN PEDIATRIC BOTTLE 2CC  Final   Culture NO GROWTH 4 DAYS  Final   Report Status PENDING  Incomplete  Culture, blood (routine x 2)     Status: None (Preliminary result)   Collection Time: 09/28/16  3:35 PM  Result Value Ref Range Status   Specimen Description BLOOD RIGHT HAND  Final   Special Requests IN PEDIATRIC BOTTLE 2CC  Final   Culture NO GROWTH 4  DAYS  Final   Report Status PENDING  Incomplete  Culture, blood (routine x 2)     Status: None (Preliminary result)   Collection Time: 09/28/16  3:40 PM  Result Value Ref Range Status   Specimen Description BLOOD LEFT HAND  Final   Special Requests IN PEDIATRIC BOTTLE 2CC  Final   Culture NO GROWTH 4 DAYS  Final   Report Status PENDING  Incomplete    RADIOLOGY STUDIES/RESULTS: Dg Chest 2 View  Result Date: 09/26/2016 CLINICAL DATA:   Chest pain for several weeks EXAM: CHEST  2 VIEW COMPARISON:  08/17/2016 FINDINGS: The heart size and mediastinal contours are within normal limits. Both lungs are clear. The visualized skeletal structures are unremarkable. IMPRESSION: No active cardiopulmonary disease. Electronically Signed   By: Inez Catalina M.D.   On: 09/26/2016 20:32   Mr Lumbar Spine W Wo Contrast  Result Date: 09/30/2016 CLINICAL DATA:  Back pain. Recent sepsis due to pyelonephritis and E coli bacteremia. EXAM: MRI LUMBAR SPINE WITHOUT AND WITH CONTRAST TECHNIQUE: Multiplanar and multiecho pulse sequences of the lumbar spine were obtained without and with intravenous contrast. CONTRAST:  52mL MULTIHANCE GADOBENATE DIMEGLUMINE 529 MG/ML IV SOLN COMPARISON:  None. FINDINGS: Segmentation:  Normal segmentation. Alignment:  Mild retrolisthesis L1-2 Vertebrae: Negative for fracture or mass. Heterogeneous bone marrow diffusely. Possibly due to anemia. No evidence of discitis. Conus medullaris: Extends to the T12-L1 level and appears normal. Paraspinal and other soft tissues: Bilateral renal cysts. No retroperitoneal mass. Psoas muscles symmetric. There is edema in the paraspinous muscles bilaterally with enhancement suggesting myositis. Disc levels: L1-2: Mild retrolisthesis. Disc and facet degeneration. Mild spinal stenosis. L2-3:  Negative L3-4:  Mild disc and facet degeneration without stenosis L4-5: Diffuse disc bulging. Extensive facet degeneration with bony overgrowth and bilateral facet joint effusions. 5 mm posterior epidural fluid collection is present on the right side probably arising from the facet joint. There is surrounding epidural enhancement above and below the cyst. This could represent a small abscess however could also be noninfected. In addition, there is a 8 mm fluid collection to the left of the L4 spinous process which does not enhance. This could be a small abscess as well. There is surrounding myositis. L5-S1:  Negative  IMPRESSION: Heterogeneous bone marrow with overall benign appearance possibly due to anemia and prominent red marrow Severe facet degeneration at L4-5 with bilateral facet joint effusions. Right posterior epidural fluid collection 5 mm with surrounding epidural enhancement, concerning for epidural abscess versus synovial cyst. 8 mm fluid collection to the left of the L4 spinous process could also represent an abscess although it does not enhance as would be expected. There is edema and enhancement in the erector spinae muscles bilaterally suggesting myositis. Overall the findings are concerning for spinal infection. These results were called by telephone at the time of interpretation on 09/30/2016 at 6:21 pm to Dr. Oren Binet , who verbally acknowledged these results. Electronically Signed   By: Franchot Gallo M.D.   On: 09/30/2016 18:23   Ct Abdomen Pelvis W Contrast  Result Date: 09/26/2016 CLINICAL DATA:  Mid abdominal pain radiating to his back x1 day. EXAM: CT ABDOMEN AND PELVIS WITH CONTRAST TECHNIQUE: Multidetector CT imaging of the abdomen and pelvis was performed using the standard protocol following bolus administration of intravenous contrast. CONTRAST:  152mL ISOVUE-300 IOPAMIDOL (ISOVUE-300) INJECTION 61% COMPARISON:  08/31/2016 go FINDINGS: Lower chest: There is mild bilateral lower lobe bronchiectasis. Clearing of bibasilar atelectasis and/or pneumonia. Top-normal sized heart without  pericardial nor pleural effusions. No pneumothoraces. Hepatobiliary: No space-occupying mass of the liver. Stable 6 mm hypodensity in the left hepatic lobe statistically consistent with a cyst or hemangioma. No intrahepatic ductal dilatation. Tiny dependent gallstones without secondary stoma signs: Cystitis. Pancreas: No pancreatic mass. A few speckled calcifications along the pancreatic tail may be vascular in etiology or related to chronic pancreatitis. No pancreatic ductal dilatation or surrounding  inflammatory changes. Spleen: Normal in size without focal abnormality. Adrenals/Urinary Tract: Normal adrenal glands. Multiple hypodense lesions of both kidneys consistent with cysts remain unchanged. The largest are in the upper pole on the right measuring up to 5.7 cm and on the left 3.7 cm. No obstructive uropathy. No nephrolithiasis. No solid enhancing mass lesions. Mild circumferential thickening of the bladder is again noted which may be related to underdistention and/or cystitis. Stomach/Bowel: The stomach is contracted in appearance. There is moderate colonic stool burden without acute inflammation. Normal-appearing appendix. No small bowel obstruction. Scattered colonic diverticulosis. Vascular/Lymphatic: Aortic atherosclerosis. No enlarged abdominal or pelvic lymph nodes. Reproductive: Irregular enlarged prostate gland containing central zone calcifications are unchanged in appearance. This impresses upon the base of the bladder as before. Other: No free air or free fluid. Musculoskeletal: No acute nor suspicious osseous abnormality. Lower lumbar degenerative facet arthropathy. IMPRESSION: 1. Clearing of bibasilar atelectasis and/or pneumonia since prior exam. Bilateral lower lobe bronchiectasis is incidentally noted. 2. Stable bilateral renal hypodensities consistent with cysts. No obstructive uropathy. 3. Uncomplicated cholelithiasis. 4. Chronic mild circumferential thickening of the urinary bladder as before may be related to cystitis. 5. Stable prostatomegaly. 6. Lower lumbar facet arthropathy. 7. Moderate colonic stool burden. No bowel obstruction or acute inflammation. Electronically Signed   By: Ashley Royalty M.D.   On: 09/26/2016 21:39     LOS: 6 days   Oren Binet, MD  [facility] Pager:[phone]  If 7PM-7AM, please contact night-coverage [email or web address] Password [email or web address] 10/02/2016, 2:09 PM

## 2016-10-03 ENCOUNTER — Inpatient Hospital Stay (HOSPITAL_COMMUNITY): Payer: Commercial Managed Care - HMO

## 2016-10-03 DIAGNOSIS — M4646 Discitis, unspecified, lumbar region: Secondary | ICD-10-CM

## 2016-10-03 DIAGNOSIS — R7881 Bacteremia: Secondary | ICD-10-CM

## 2016-10-03 LAB — CBC
HEMATOCRIT: 29.2 % — AB (ref 39.0–52.0)
HEMOGLOBIN: 9.2 g/dL — AB (ref 13.0–17.0)
MCH: 27.5 pg (ref 26.0–34.0)
MCHC: 31.5 g/dL (ref 30.0–36.0)
MCV: 87.4 fL (ref 78.0–100.0)
Platelets: 346 10*3/uL (ref 150–400)
RBC: 3.34 MIL/uL — ABNORMAL LOW (ref 4.22–5.81)
RDW: 13.7 % (ref 11.5–15.5)
WBC: 9.4 10*3/uL (ref 4.0–10.5)

## 2016-10-03 LAB — CULTURE, BLOOD (ROUTINE X 2)
CULTURE: NO GROWTH
CULTURE: NO GROWTH
CULTURE: NO GROWTH
Culture: NO GROWTH

## 2016-10-03 LAB — GLUCOSE, CAPILLARY
GLUCOSE-CAPILLARY: 107 mg/dL — AB (ref 65–99)
GLUCOSE-CAPILLARY: 79 mg/dL (ref 65–99)
Glucose-Capillary: 51 mg/dL — ABNORMAL LOW (ref 65–99)
Glucose-Capillary: 137 mg/dL — ABNORMAL HIGH (ref 65–99)
Glucose-Capillary: 138 mg/dL — ABNORMAL HIGH (ref 65–99)

## 2016-10-03 LAB — BASIC METABOLIC PANEL
ANION GAP: 10 (ref 5–15)
BUN: 14 mg/dL (ref 6–20)
CALCIUM: 8.8 mg/dL — AB (ref 8.9–10.3)
CHLORIDE: 102 mmol/L (ref 101–111)
CO2: 25 mmol/L (ref 22–32)
Creatinine, Ser: 0.8 mg/dL (ref 0.61–1.24)
GFR calc non Af Amer: >60 mL/min (ref >60)
GLUCOSE: 111 mg/dL — AB (ref 65–99)
Potassium: 3.8 mmol/L (ref 3.5–5.1)
Sodium: 137 mmol/L (ref 135–145)

## 2016-10-03 LAB — C-REACTIVE PROTEIN: CRP: 8.1 mg/dL — AB (ref <1.0)

## 2016-10-03 LAB — SEDIMENTATION RATE: Sed Rate: >140 mm/h — ABNORMAL HIGH (ref 0–16)

## 2016-10-03 LAB — ECHOCARDIOGRAM COMPLETE
Height: 72 in
Weight: 2801.6 oz

## 2016-10-03 MED ORDER — PERFLUTREN LIPID MICROSPHERE
1.0000 mL | INTRAVENOUS | Status: AC | PRN
  Administered 2016-10-03: 2 mL via INTRAVENOUS
  Filled 2016-10-03: qty 10

## 2016-10-03 NOTE — Progress Notes (Signed)
Bladder scan completed. 850 cc noted. MD notified. MD stated to ask patient to urinate. If he is unable to, then call MD back. Also, orders to measure the patients output if he can and then repeat bladder scan. Orders to be followed. Will continue to monitor.

## 2016-10-03 NOTE — Progress Notes (Addendum)
  Echocardiogram 2D Echocardiogram with Definity has been performed.  Diamond Nickel 10/03/2016, 2:55 PM

## 2016-10-03 NOTE — Progress Notes (Signed)
    [facility] for Infectious Disease           Day 8 abtx        Day 8 ctx           ID: Charles Parker is a 70 y.o. male with ecoli bacteremia with small epidural abscess Principal Problem:   Sepsis due to urinary tract infection ([place]) Active Problems:   Hypertension   OSA (obstructive sleep apnea)   Depression with anxiety   DVT, lower extremity, proximal (HCC)   Diabetes mellitus, type II, insulin dependent (HCC)   Sepsis secondary to UTI (HCC)   Elevated lactic acid level    Subjective: Afebrile, had TTE, read is pending  Medications:  . cefTRIAXone (ROCEPHIN)  IV  2 g Intravenous Q12H  . insulin aspart  0-15 Units Subcutaneous TID WC  . insulin aspart  0-5 Units Subcutaneous QHS  . insulin glargine  25 Units Subcutaneous QHS  . methocarbamol  500 mg Oral TID  . multivitamin with minerals  1 tablet Oral Daily  . polyethylene glycol  17 g Oral Daily  . rivaroxaban  20 mg Oral Q breakfast  . simvastatin  40 mg Oral q1800  . sodium chloride flush  3 mL Intravenous Q12H  . tamsulosin  0.4 mg Oral Daily    Objective: Vital signs in last 24 hours: Temp:  [97.6 F (36.4 C)-99.7 F (37.6 C)] 97.6 F (36.4 C) (12/31 0847) Pulse Rate:  [83-103] 89 (12/31 0847) Resp:  [16-18] 18 (12/31 0847) BP: (131-146)/(78-90) 131/83 (12/31 0847) SpO2:  [96 %-97 %] 97 % (12/31 0847) Physical Exam  Constitutional: He is oriented to person, place, and time. He appears well-developed and well-nourished. No distress.  HENT:  Mouth/Throat: Oropharynx is clear and moist. No oropharyngeal exudate.  Cardiovascular: Normal rate, regular rhythm and normal heart sounds. Exam reveals no gallop and no friction rub.  No murmur heard.  Pulmonary/Chest: Effort normal and breath sounds normal. No respiratory distress. He has no wheezes.  Abdominal: Soft. Bowel sounds are normal. He exhibits no distension. There is no tenderness.  Lymphadenopathy:  He has no cervical adenopathy.    Neurological: He is alert and oriented to person, place, and time.  Skin: Skin is warm and dry. No rash noted. No erythema.  Psychiatric: He has a normal mood and affect. His behavior is normal.     Lab Results  Recent Labs  10/03/16 0149  WBC 9.4  HGB 9.2*  HCT 29.2*  NA 137  K 3.8  CL 102  CO2 25  BUN 14  CREATININE 0.80   No results found for: [place]  Microbiology:  Studies/Results: No results found.   Assessment/Plan: 70 yo M with recurrent ecoli bacteremia thought to be due to sepsis of urinary source but also found to have small epidural abscess.  -  continue with ceftriaxone 2gm iv daily currently on day 8 of 42, likely need 6 wk iv abtx -  please get TEE to see if any signs of endocarditis, since he had recent bacteremia roughly 4 weeks ago. TTE is pending   Baxter Flattery [facility] Cell: [phone] Pager: [phone]  10/03/2016, 3:14 PM

## 2016-10-03 NOTE — Progress Notes (Signed)
CRITICAL VALUE ALERT  Critical value received: CBG:51  Date of notification:  10/03/16  Time of notification:  0820  Critical value read back: Yes  Nurse who received alert:  Cardell Peach   MD notified (1st page): Ghimire  Time of first page:  [phone]  Re-checked 15 min. Later. CBG:107. Will continue to monitor.

## 2016-10-03 NOTE — Progress Notes (Signed)
Patient has voided. Unable to measure D/T condom cath coming off. Bladder scan completed. 40mL noted. MD notified. Will continue to monitor.

## 2016-10-03 NOTE — Progress Notes (Signed)
PROGRESS NOTE        PATIENT DETAILS Name: VINH BLOOM Age: 70 y.o. Sex: male Date of Birth: Jun 06, 1946 Admit Date: 09/26/2016 Admitting Physician Vianne Bulls, MD SS:[id number] [name], MD  Brief Narrative: Patient is a 70 y.o. male with recent hospitalization (11/14-11/17) for sepsis due to pyelonephritis and Escherichia coli bacteremia admitted on 12/24 with fever/back pain/left flank pain-blood cultures again positive for Escherichia coli. Initially thought to have recurrent UTI/pyelonephritis, but since back pain persisted-underwent MRI of the brain that shows epidural abscess/phlegmon/osteomyelitis. See below for further details.   Subjective: Back pain unchanged--patient appears comfortable.  Assessment/Plan: Principal Problem: Sepsis due to E coli bacteremia/pyelonephritis/ L4-L5 epidural abscess with osteomyelitis and phlegmon formation: Sepsis pathophysiology has resolved, continue Rocephin. Repeat blood cultures on 12/26 already negative. Given MRI findings, infectious disease was consulted, recommendations are to continue Rocephin and proceed with a TEE that has been tentatively scheduled for 10/05/16.   Note:discussed with neurosurgeon- Dr. Marland Kitchen Ditty over the phone on 12/28-who reviewed the MRI results, and thought that patient probably had osteomyelitis of the area noted above and recommended medical management.  Acute kidney injury: Probably mild prerenal azotemia, resolved.  Insulin-dependent diabetes: Hypoglycemic episode earlier this morning-decrease Lantus to 25 units, continue SSI. Follow.   Dyslipidemia: Continue simvastatin  Hypertension:Pressure appears stable, lisinopril remains on hold-resume prior to discharge.  BPH: Continue Flomax  History of venous thromboembolism: Continue Xarelto-per PCPs note-needs lifelong anticoagulation  DVT Prophylaxis: Full dose anticoagulation Xarelto  Code Status: Full code   Family  Communication: Spoke with brother over the phone  Disposition Plan: Remain inpatient- SNF on discharge-likely on 1/2 after TEE completed  Antimicrobial agents: See below  Procedures: None  CONSULTS:  None  Time spent: 25- minutes-Greater than 50% of this time was spent in counseling, explanation of diagnosis, planning of further management, and coordination of care.  MEDICATIONS: Anti-infectives    Start     Dose/Rate Route Frequency Ordered Stop   10/01/16 2200  cefTRIAXone (ROCEPHIN) 2 g in dextrose 5 % 50 mL IVPB     2 g 100 mL/hr over 30 Minutes Intravenous Every 12 hours 10/01/16 1107     10/01/16 1000  cefTRIAXone (ROCEPHIN) 2 g in dextrose 5 % 50 mL IVPB  Status:  Discontinued     2 g 100 mL/hr over 30 Minutes Intravenous  Every morning - 10a 10/01/16 0931 10/01/16 1107   09/27/16 1800  cefTRIAXone (ROCEPHIN) 2 g in dextrose 5 % 50 mL IVPB  Status:  Discontinued     2 g 100 mL/hr over 30 Minutes Intravenous Every 24 hours 09/26/16 1857 09/26/16 2234   09/27/16 1100  cefTRIAXone (ROCEPHIN) 2 g in dextrose 5 % 50 mL IVPB  Status:  Discontinued     2 g 100 mL/hr over 30 Minutes Intravenous Every 24 hours 09/27/16 1024 10/01/16 0931   09/26/16 2359  ceFEPIme (MAXIPIME) 1 g in dextrose 5 % 50 mL IVPB  Status:  Discontinued     1 g 100 mL/hr over 30 Minutes Intravenous Every 12 hours 09/26/16 2307 09/27/16 1008   09/26/16 2245  ceFEPIme (MAXIPIME) 2 g in dextrose 5 % 50 mL IVPB  Status:  Discontinued     2 g 100 mL/hr over 30 Minutes Intravenous  Once 09/26/16 2233 09/26/16 2306   09/26/16 1845  cefTRIAXone (ROCEPHIN) 2 g in  dextrose 5 % 50 mL IVPB     2 g 100 mL/hr over 30 Minutes Intravenous  Once 09/26/16 1835 09/26/16 2030      Scheduled Meds: . cefTRIAXone (ROCEPHIN)  IV  2 g Intravenous Q12H  . insulin aspart  0-15 Units Subcutaneous TID WC  . insulin aspart  0-5 Units Subcutaneous QHS  . insulin glargine  25 Units Subcutaneous QHS  . methocarbamol  500 mg  Oral TID  . multivitamin with minerals  1 tablet Oral Daily  . polyethylene glycol  17 g Oral Daily  . rivaroxaban  20 mg Oral Q breakfast  . simvastatin  40 mg Oral q1800  . sodium chloride flush  3 mL Intravenous Q12H  . tamsulosin  0.4 mg Oral Daily   Continuous Infusions: PRN Meds:.HYDROcodone-acetaminophen, morphine injection, ondansetron **OR** ondansetron (ZOFRAN) IV, oxyCODONE-acetaminophen   PHYSICAL EXAM: Vital signs: Vitals:   10/02/16 1743 10/02/16 2148 10/03/16 0536 10/03/16 0847  BP: (!) 145/86 136/78 (!) 146/90 131/83  Pulse: (!) 103 (!) 102 83 89  Resp: 17 16 16 18   Temp: 98.1 F (36.7 C) 99.7 F (37.6 C) 99.1 F (37.3 C) 97.6 F (36.4 C)  TempSrc: Oral Oral Oral Oral  SpO2: 96% 97% 97% 97%  Weight:      Height:       Filed Weights   09/28/16 0500 09/29/16 2023 09/30/16 2112  Weight: 78.4 kg (172 lb 13.5 oz) 84.3 kg (185 lb 13.6 oz) 79.4 kg (175 lb 1.6 oz)   Body mass index is 23.75 kg/m.   General appearance :Awake, alert, not in any distress. Speech Clear. Not toxic Looking Eyes:, pupils equally reactive to light and accomodation,no scleral icterus.Pink conjunctiva HEENT: Atraumatic and Normocephalic Neck: supple, no JVD. No cervical lymphadenopathy. No thyromegaly Resp:Good air entry bilaterally, no added sounds  CVS: S1 S2 regular, no murmurs.  GI: Bowel sounds present, Non tender and not distended with no gaurding, rigidity or rebound.No organomegaly.Left CVA angle is tender Extremities: B/L Lower Ext shows no edema, both legs are warm to touch Neurology:  speech clear,Non focal, sensation is grossly intact. Psychiatric: Alert and oriented x 3. Normal mood. Musculoskeletal:.No digital cyanosis Skin:No Rash, warm and dry Wounds:N/A  I have personally reviewed following labs and imaging studies  LABORATORY DATA: CBC:  Recent Labs Lab 09/26/16 1835 09/27/16 0355 09/28/16 0516 10/03/16 0149  WBC 19.6* 17.3* 9.7 9.4  NEUTROABS  --  14.6*  7.1  --   HGB 11.2* 9.2* 10.1* 9.2*  HCT 34.2* 27.8* 31.6* 29.2*  MCV 88.1 87.7 88.8 87.4  PLT 307 239 240 [id number]    Basic Metabolic Panel:  Recent Labs Lab 09/26/16 1835 09/27/16 0355 09/28/16 0516 10/03/16 0149  NA 135 137 138 137  K 4.0 4.2 4.2 3.8  CL 100* 107 108 102  CO2 21* 25 24 25   GLUCOSE 197* 161* 98 111*  BUN 15 17 10 14   CREATININE 1.41* 0.94 0.81 0.80  CALCIUM 9.2 8.1* 8.7* 8.8*  MG  --   --  1.6*  --     GFR: Estimated Creatinine Clearance: 94.3 mL/min (by C-G formula based on SCr of 0.8 mg/dL).  Liver Function Tests:  Recent Labs Lab 09/26/16 1835 09/28/16 0516  AST 32 21  ALT 21 15*  ALKPHOS 74 58  BILITOT 0.8 0.2*  PROT 8.5* 6.8  ALBUMIN 2.9* 2.3*   No results for input(s): LIPASE, AMYLASE in the last 168 hours. No results for input(s): AMMONIA in the last  168 hours.  Coagulation Profile:  Recent Labs Lab 09/26/16 2228  INR 1.37    Cardiac Enzymes:  Recent Labs Lab 09/28/16 1542 09/28/16 2108 09/29/16 0529  TROPONINI <0.03 0.04* <0.03    BNP (last 3 results) No results for input(s): PROBNP in the last 8760 hours.  HbA1C: No results for input(s): HGBA1C in the last 72 hours.  CBG:  Recent Labs Lab 10/02/16 1147 10/02/16 1624 10/02/16 2145 10/03/16 0814 10/03/16 0842  GLUCAP 150* 114* 152* 51* 107*    Lipid Profile: No results for input(s): CHOL, HDL, LDLCALC, TRIG, CHOLHDL, LDLDIRECT in the last 72 hours.  Thyroid Function Tests: No results for input(s): TSH, T4TOTAL, FREET4, T3FREE, THYROIDAB in the last 72 hours.  Anemia Panel: No results for input(s): VITAMINB12, FOLATE, FERRITIN, TIBC, IRON, RETICCTPCT in the last 72 hours.  Urine analysis:    Component Value Date/Time   COLORURINE AMBER (A) 09/26/2016 1835   APPEARANCEUR CLOUDY (A) 09/26/2016 1835   LABSPEC 1.018 09/26/2016 1835   PHURINE 5.0 09/26/2016 1835   GLUCOSEU NEGATIVE 09/26/2016 1835   GLUCOSEU NEGATIVE 09/08/2016 1513   HGBUR LARGE (A)  09/26/2016 1835   BILIRUBINUR NEGATIVE 09/26/2016 1835   BILIRUBINUR negative 08/17/2016 1359   KETONESUR 20 (A) 09/26/2016 1835   PROTEINUR 30 (A) 09/26/2016 1835   UROBILINOGEN 0.2 09/08/2016 1513   NITRITE POSITIVE (A) 09/26/2016 1835   LEUKOCYTESUR LARGE (A) 09/26/2016 1835    Sepsis Labs: Lactic Acid, Venous    Component Value Date/Time   LATICACIDVEN 1.0 09/27/2016 0355    MICROBIOLOGY: Recent Results (from the past 240 hour(s))  Blood Culture (routine x 2)     Status: Abnormal   Collection Time: 09/26/16  6:35 PM  Result Value Ref Range Status   Specimen Description BLOOD RIGHT ANTECUBITAL  Final   Special Requests BOTTLES DRAWN AEROBIC AND ANAEROBIC 5CC  Final   Culture  Setup Time   Final    GRAM NEGATIVE RODS IN BOTH AEROBIC AND ANAEROBIC BOTTLES Organism ID to follow CRITICAL RESULT CALLED TO, READ BACK BY AND VERIFIED WITH: Denton Brick, PHARM AT 0811 ON [id number] BY Rhea Bleacher    Culture ESCHERICHIA COLI (A)  Final   Report Status 09/29/2016 FINAL  Final   Organism ID, Bacteria ESCHERICHIA COLI  Final      Susceptibility   Escherichia coli - MIC*    AMPICILLIN >=32 RESISTANT Resistant     CEFAZOLIN <=4 SENSITIVE Sensitive     CEFEPIME <=1 SENSITIVE Sensitive     CEFTAZIDIME <=1 SENSITIVE Sensitive     CEFTRIAXONE <=1 SENSITIVE Sensitive     CIPROFLOXACIN <=0.25 SENSITIVE Sensitive     GENTAMICIN <=1 SENSITIVE Sensitive     IMIPENEM <=0.25 SENSITIVE Sensitive     TRIMETH/SULFA <=20 SENSITIVE Sensitive     AMPICILLIN/SULBACTAM 16 INTERMEDIATE Intermediate     PIP/TAZO <=4 SENSITIVE Sensitive     Extended ESBL NEGATIVE Sensitive     * ESCHERICHIA COLI  Urine culture     Status: Abnormal   Collection Time: 09/26/16  6:35 PM  Result Value Ref Range Status   Specimen Description URINE, RANDOM  Final   Special Requests NONE  Final   Culture >=100,000 COLONIES/mL ESCHERICHIA COLI (A)  Final   Report Status 09/28/2016 FINAL  Final   Organism ID, Bacteria  ESCHERICHIA COLI (A)  Final      Susceptibility   Escherichia coli - MIC*    AMPICILLIN >=32 RESISTANT Resistant     CEFAZOLIN <=4 SENSITIVE Sensitive  CEFTRIAXONE <=1 SENSITIVE Sensitive     CIPROFLOXACIN <=0.25 SENSITIVE Sensitive     GENTAMICIN <=1 SENSITIVE Sensitive     IMIPENEM <=0.25 SENSITIVE Sensitive     NITROFURANTOIN <=16 SENSITIVE Sensitive     TRIMETH/SULFA <=20 SENSITIVE Sensitive     AMPICILLIN/SULBACTAM 16 INTERMEDIATE Intermediate     PIP/TAZO <=4 SENSITIVE Sensitive     Extended ESBL NEGATIVE Sensitive     * >=100,000 COLONIES/mL ESCHERICHIA COLI  Blood Culture ID Panel (Reflexed)     Status: Abnormal   Collection Time: 09/26/16  6:35 PM  Result Value Ref Range Status   Enterococcus species NOT DETECTED NOT DETECTED Final   Listeria monocytogenes NOT DETECTED NOT DETECTED Final   Staphylococcus species NOT DETECTED NOT DETECTED Final   Staphylococcus aureus NOT DETECTED NOT DETECTED Final   Streptococcus species NOT DETECTED NOT DETECTED Final   Streptococcus agalactiae NOT DETECTED NOT DETECTED Final   Streptococcus pneumoniae NOT DETECTED NOT DETECTED Final   Streptococcus pyogenes NOT DETECTED NOT DETECTED Final   Acinetobacter baumannii NOT DETECTED NOT DETECTED Final   Enterobacteriaceae species DETECTED (A) NOT DETECTED Final    Comment: CRITICAL RESULT CALLED TO, READ BACK BY AND VERIFIED WITH: G. ABBOTT, PHARM AT 0811 ON [id number] BY S. YARBROUGH    Enterobacter cloacae complex NOT DETECTED NOT DETECTED Final   Escherichia coli DETECTED (A) NOT DETECTED Final    Comment: CRITICAL RESULT CALLED TO, READ BACK BY AND VERIFIED WITH: G. ABBOTT, PHARM AT 0811 ON [id number] BY S. YARBROUGH    Klebsiella oxytoca NOT DETECTED NOT DETECTED Final   Klebsiella pneumoniae NOT DETECTED NOT DETECTED Final   Proteus species NOT DETECTED NOT DETECTED Final   Serratia marcescens NOT DETECTED NOT DETECTED Final   Carbapenem resistance NOT DETECTED NOT DETECTED Final    Haemophilus influenzae NOT DETECTED NOT DETECTED Final   Neisseria meningitidis NOT DETECTED NOT DETECTED Final   Pseudomonas aeruginosa NOT DETECTED NOT DETECTED Final   Candida albicans NOT DETECTED NOT DETECTED Final   Candida glabrata NOT DETECTED NOT DETECTED Final   Candida krusei NOT DETECTED NOT DETECTED Final   Candida parapsilosis NOT DETECTED NOT DETECTED Final   Candida tropicalis NOT DETECTED NOT DETECTED Final  Blood Culture (routine x 2)     Status: None   Collection Time: 09/26/16  6:50 PM  Result Value Ref Range Status   Specimen Description BLOOD LEFT ANTECUBITAL  Final   Special Requests BOTTLES DRAWN AEROBIC AND ANAEROBIC 5CC  Final   Culture NO GROWTH 5 DAYS  Final   Report Status 10/01/2016 FINAL  Final  Culture, blood (routine x 2)     Status: None (Preliminary result)   Collection Time: 09/28/16  5:15 AM  Result Value Ref Range Status   Specimen Description BLOOD RIGHT HAND  Final   Special Requests BOTTLES DRAWN AEROBIC ONLY 6CC  Final   Culture NO GROWTH 4 DAYS  Final   Report Status PENDING  Incomplete  Culture, blood (routine x 2)     Status: None (Preliminary result)   Collection Time: 09/28/16  5:20 AM  Result Value Ref Range Status   Specimen Description BLOOD LEFT HAND  Final   Special Requests IN PEDIATRIC BOTTLE 2CC  Final   Culture NO GROWTH 4 DAYS  Final   Report Status PENDING  Incomplete  Culture, blood (routine x 2)     Status: None (Preliminary result)   Collection Time: 09/28/16  3:35 PM  Result Value Ref Range Status  Specimen Description BLOOD RIGHT HAND  Final   Special Requests IN PEDIATRIC BOTTLE 2CC  Final   Culture NO GROWTH 4 DAYS  Final   Report Status PENDING  Incomplete  Culture, blood (routine x 2)     Status: None (Preliminary result)   Collection Time: 09/28/16  3:40 PM  Result Value Ref Range Status   Specimen Description BLOOD LEFT HAND  Final   Special Requests IN PEDIATRIC BOTTLE 2CC  Final   Culture NO GROWTH 4 DAYS   Final   Report Status PENDING  Incomplete    RADIOLOGY STUDIES/RESULTS: Dg Chest 2 View  Result Date: 09/26/2016 CLINICAL DATA:  Chest pain for several weeks EXAM: CHEST  2 VIEW COMPARISON:  08/17/2016 FINDINGS: The heart size and mediastinal contours are within normal limits. Both lungs are clear. The visualized skeletal structures are unremarkable. IMPRESSION: No active cardiopulmonary disease. Electronically Signed   By: Inez Catalina M.D.   On: 09/26/2016 20:32   Mr Lumbar Spine W Wo Contrast  Result Date: 09/30/2016 CLINICAL DATA:  Back pain. Recent sepsis due to pyelonephritis and E coli bacteremia. EXAM: MRI LUMBAR SPINE WITHOUT AND WITH CONTRAST TECHNIQUE: Multiplanar and multiecho pulse sequences of the lumbar spine were obtained without and with intravenous contrast. CONTRAST:  76mL MULTIHANCE GADOBENATE DIMEGLUMINE 529 MG/ML IV SOLN COMPARISON:  None. FINDINGS: Segmentation:  Normal segmentation. Alignment:  Mild retrolisthesis L1-2 Vertebrae: Negative for fracture or mass. Heterogeneous bone marrow diffusely. Possibly due to anemia. No evidence of discitis. Conus medullaris: Extends to the T12-L1 level and appears normal. Paraspinal and other soft tissues: Bilateral renal cysts. No retroperitoneal mass. Psoas muscles symmetric. There is edema in the paraspinous muscles bilaterally with enhancement suggesting myositis. Disc levels: L1-2: Mild retrolisthesis. Disc and facet degeneration. Mild spinal stenosis. L2-3:  Negative L3-4:  Mild disc and facet degeneration without stenosis L4-5: Diffuse disc bulging. Extensive facet degeneration with bony overgrowth and bilateral facet joint effusions. 5 mm posterior epidural fluid collection is present on the right side probably arising from the facet joint. There is surrounding epidural enhancement above and below the cyst. This could represent a small abscess however could also be noninfected. In addition, there is a 8 mm fluid collection to the  left of the L4 spinous process which does not enhance. This could be a small abscess as well. There is surrounding myositis. L5-S1:  Negative IMPRESSION: Heterogeneous bone marrow with overall benign appearance possibly due to anemia and prominent red marrow Severe facet degeneration at L4-5 with bilateral facet joint effusions. Right posterior epidural fluid collection 5 mm with surrounding epidural enhancement, concerning for epidural abscess versus synovial cyst. 8 mm fluid collection to the left of the L4 spinous process could also represent an abscess although it does not enhance as would be expected. There is edema and enhancement in the erector spinae muscles bilaterally suggesting myositis. Overall the findings are concerning for spinal infection. These results were called by telephone at the time of interpretation on 09/30/2016 at 6:21 pm to Dr. Oren Binet , who verbally acknowledged these results. Electronically Signed   By: Franchot Gallo M.D.   On: 09/30/2016 18:23   Ct Abdomen Pelvis W Contrast  Result Date: 09/26/2016 CLINICAL DATA:  Mid abdominal pain radiating to his back x1 day. EXAM: CT ABDOMEN AND PELVIS WITH CONTRAST TECHNIQUE: Multidetector CT imaging of the abdomen and pelvis was performed using the standard protocol following bolus administration of intravenous contrast. CONTRAST:  180mL ISOVUE-300 IOPAMIDOL (ISOVUE-300) INJECTION 61% COMPARISON:  08/31/2016 go FINDINGS: Lower chest: There is mild bilateral lower lobe bronchiectasis. Clearing of bibasilar atelectasis and/or pneumonia. Top-normal sized heart without pericardial nor pleural effusions. No pneumothoraces. Hepatobiliary: No space-occupying mass of the liver. Stable 6 mm hypodensity in the left hepatic lobe statistically consistent with a cyst or hemangioma. No intrahepatic ductal dilatation. Tiny dependent gallstones without secondary stoma signs: Cystitis. Pancreas: No pancreatic mass. A few speckled calcifications along  the pancreatic tail may be vascular in etiology or related to chronic pancreatitis. No pancreatic ductal dilatation or surrounding inflammatory changes. Spleen: Normal in size without focal abnormality. Adrenals/Urinary Tract: Normal adrenal glands. Multiple hypodense lesions of both kidneys consistent with cysts remain unchanged. The largest are in the upper pole on the right measuring up to 5.7 cm and on the left 3.7 cm. No obstructive uropathy. No nephrolithiasis. No solid enhancing mass lesions. Mild circumferential thickening of the bladder is again noted which may be related to underdistention and/or cystitis. Stomach/Bowel: The stomach is contracted in appearance. There is moderate colonic stool burden without acute inflammation. Normal-appearing appendix. No small bowel obstruction. Scattered colonic diverticulosis. Vascular/Lymphatic: Aortic atherosclerosis. No enlarged abdominal or pelvic lymph nodes. Reproductive: Irregular enlarged prostate gland containing central zone calcifications are unchanged in appearance. This impresses upon the base of the bladder as before. Other: No free air or free fluid. Musculoskeletal: No acute nor suspicious osseous abnormality. Lower lumbar degenerative facet arthropathy. IMPRESSION: 1. Clearing of bibasilar atelectasis and/or pneumonia since prior exam. Bilateral lower lobe bronchiectasis is incidentally noted. 2. Stable bilateral renal hypodensities consistent with cysts. No obstructive uropathy. 3. Uncomplicated cholelithiasis. 4. Chronic mild circumferential thickening of the urinary bladder as before may be related to cystitis. 5. Stable prostatomegaly. 6. Lower lumbar facet arthropathy. 7. Moderate colonic stool burden. No bowel obstruction or acute inflammation. Electronically Signed   By: Ashley Royalty M.D.   On: 09/26/2016 21:39     LOS: 7 days   Oren Binet, MD  [facility] Pager:[phone]  If 7PM-7AM, please contact  night-coverage [email or web address] Password [email or web address] 10/03/2016, 11:52 AM

## 2016-10-04 DIAGNOSIS — G4733 Obstructive sleep apnea (adult) (pediatric): Secondary | ICD-10-CM

## 2016-10-04 LAB — GLUCOSE, CAPILLARY
GLUCOSE-CAPILLARY: 94 mg/dL (ref 65–99)
GLUCOSE-CAPILLARY: 166 mg/dL — AB (ref 65–99)
GLUCOSE-CAPILLARY: 152 mg/dL — AB (ref 65–99)
Glucose-Capillary: 88 mg/dL (ref 65–99)

## 2016-10-04 MED ORDER — FLEET ENEMA 7-19 GM/118ML RE ENEM
1.0000 | ENEMA | Freq: Every day | RECTAL | Status: DC | PRN
  Filled 2016-10-04: qty 1

## 2016-10-04 MED ORDER — BISACODYL 10 MG RE SUPP: 10.0000 mg | Freq: Every day | RECTAL | Status: DC | PRN

## 2016-10-04 MED ORDER — POLYETHYLENE GLYCOL 3350 17 G PO PACK
17.0000 g | PACK | Freq: Two times a day (BID) | ORAL | Status: DC
  Administered 2016-10-04 – 2016-10-06 (×5): 17 g via ORAL
  Filled 2016-10-04 (×4): qty 1

## 2016-10-04 MED ORDER — SODIUM CHLORIDE 0.9% FLUSH
10.0000 mL | INTRAVENOUS | Status: DC | PRN
  Administered 2016-10-04 (×2): 10 mL
  Filled 2016-10-04 (×2): qty 40

## 2016-10-04 NOTE — Progress Notes (Signed)
PROGRESS NOTE        PATIENT DETAILS Name: Charles Parker Age: 71 y.o. Sex: male Date of Birth: July 28, 1946 Admit Date: 09/26/2016 Admitting Physician Vianne Bulls, MD SS:[id number] [name], MD  Brief Narrative: Patient is a 71 y.o. male with recent hospitalization (11/14-11/17) for sepsis due to pyelonephritis and Escherichia coli bacteremia admitted on 12/24 with fever/back pain/left flank pain-blood cultures again positive for Escherichia coli. Initially thought to have recurrent UTI/pyelonephritis, but since back pain persisted-underwent MRI of the brain that shows epidural abscess/phlegmon/osteomyelitis. See below for further details.   Subjective: Back pain unchanged--patient appears comfortable. Sitting at bedside.  Assessment/Plan: Principal Problem: Sepsis due to E coli bacteremia/pyelonephritis/ L4-L5 epidural abscess with osteomyelitis and phlegmon formation: Sepsis pathophysiology has resolved, continue Rocephin. Repeat blood cultures on 12/26 already negative. Given MRI findings, infectious disease was consulted, recommendations are to continue Rocephin and proceed with a TEE that has been tentatively scheduled for 10/05/16.   Note:discussed with neurosurgeon- Dr. Marland Kitchen Ditty over the phone on 12/28-who reviewed the MRI results, and thought that patient probably had osteomyelitis of the area noted above and recommended medical management.  Acute kidney injury: Probably mild prerenal azotemia, resolved.  Insulin-dependent diabetes: Hypoglycemic episode earlier this morning-decrease Lantus to 25 units, continue SSI. Follow.   Dyslipidemia: Continue simvastatin  Hypertension:Pressure appears stable, lisinopril remains on hold-resume prior to discharge.  BPH: Continue Flomax  History of venous thromboembolism: Continue Xarelto-per PCPs note-needs lifelong anticoagulation  Constipation: Change MiraLAX to twice a day, add as needed Dulcolax and Fleet  enema.  DVT Prophylaxis: Full dose anticoagulation Xarelto  Code Status: Full code   Family Communication: None at bedside-spoke with brother over the phone on 12/31  Disposition Plan: Remain inpatient- SNF on discharge-likely on 1/2 after TEE completed  Antimicrobial agents: See below  Procedures: None  CONSULTS:  None  Time spent: 25- minutes-Greater than 50% of this time was spent in counseling, explanation of diagnosis, planning of further management, and coordination of care.  MEDICATIONS: Anti-infectives    Start     Dose/Rate Route Frequency Ordered Stop   10/01/16 2200  cefTRIAXone (ROCEPHIN) 2 g in dextrose 5 % 50 mL IVPB     2 g 100 mL/hr over 30 Minutes Intravenous Every 12 hours 10/01/16 1107     10/01/16 1000  cefTRIAXone (ROCEPHIN) 2 g in dextrose 5 % 50 mL IVPB  Status:  Discontinued     2 g 100 mL/hr over 30 Minutes Intravenous  Every morning - 10a 10/01/16 0931 10/01/16 1107   09/27/16 1800  cefTRIAXone (ROCEPHIN) 2 g in dextrose 5 % 50 mL IVPB  Status:  Discontinued     2 g 100 mL/hr over 30 Minutes Intravenous Every 24 hours 09/26/16 1857 09/26/16 2234   09/27/16 1100  cefTRIAXone (ROCEPHIN) 2 g in dextrose 5 % 50 mL IVPB  Status:  Discontinued     2 g 100 mL/hr over 30 Minutes Intravenous Every 24 hours 09/27/16 1024 10/01/16 0931   09/26/16 2359  ceFEPIme (MAXIPIME) 1 g in dextrose 5 % 50 mL IVPB  Status:  Discontinued     1 g 100 mL/hr over 30 Minutes Intravenous Every 12 hours 09/26/16 2307 09/27/16 1008   09/26/16 2245  ceFEPIme (MAXIPIME) 2 g in dextrose 5 % 50 mL IVPB  Status:  Discontinued     2 g  100 mL/hr over 30 Minutes Intravenous  Once 09/26/16 2233 09/26/16 2306   09/26/16 1845  cefTRIAXone (ROCEPHIN) 2 g in dextrose 5 % 50 mL IVPB     2 g 100 mL/hr over 30 Minutes Intravenous  Once 09/26/16 1835 09/26/16 2030      Scheduled Meds: . cefTRIAXone (ROCEPHIN)  IV  2 g Intravenous Q12H  . insulin aspart  0-15 Units Subcutaneous TID WC   . insulin aspart  0-5 Units Subcutaneous QHS  . insulin glargine  25 Units Subcutaneous QHS  . methocarbamol  500 mg Oral TID  . multivitamin with minerals  1 tablet Oral Daily  . polyethylene glycol  17 g Oral BID  . rivaroxaban  20 mg Oral Q breakfast  . simvastatin  40 mg Oral q1800  . sodium chloride flush  3 mL Intravenous Q12H  . tamsulosin  0.4 mg Oral Daily   Continuous Infusions: PRN Meds:.bisacodyl, HYDROcodone-acetaminophen, morphine injection, ondansetron **OR** ondansetron (ZOFRAN) IV, oxyCODONE-acetaminophen, sodium chloride flush, sodium phosphate   PHYSICAL EXAM: Vital signs: Vitals:   10/03/16 1801 10/03/16 2115 10/04/16 0418 10/04/16 0900  BP: 123/75 (!) 152/88 138/86 140/85  Pulse: 85 93 84 85  Resp: 18 16 16 18   Temp: 97.2 F (36.2 C) 98.8 F (37.1 C) 98 F (36.7 C) 98.6 F (37 C)  TempSrc: Oral Oral Oral Oral  SpO2: 98% 95% 98% 98%  Weight:      Height:       Filed Weights   09/28/16 0500 09/29/16 2023 09/30/16 2112  Weight: 78.4 kg (172 lb 13.5 oz) 84.3 kg (185 lb 13.6 oz) 79.4 kg (175 lb 1.6 oz)   Body mass index is 23.75 kg/m.   General appearance :Awake, alert, not in any distress. Speech Clear. Not toxic Looking Eyes:, pupils equally reactive to light and accomodation,no scleral icterus.Pink conjunctiva HEENT: Atraumatic and Normocephalic Neck: supple, no JVD. No cervical lymphadenopathy. No thyromegaly Resp:Good air entry bilaterally, no added sounds  CVS: S1 S2 regular, no murmurs.  GI: Bowel sounds present, Non tender and not distended with no gaurding, rigidity or rebound.No organomegaly.Left CVA angle is tender Extremities: B/L Lower Ext shows no edema, both legs are warm to touch Neurology:  speech clear,Non focal, sensation is grossly intact. Psychiatric: Alert and oriented x 3. Normal mood. Musculoskeletal:.No digital cyanosis Skin:No Rash, warm and dry Wounds:N/A  I have personally reviewed following labs and imaging  studies  LABORATORY DATA: CBC:  Recent Labs Lab 09/28/16 0516 10/03/16 0149  WBC 9.7 9.4  NEUTROABS 7.1  --   HGB 10.1* 9.2*  HCT 31.6* 29.2*  MCV 88.8 87.4  PLT 240 [id number]    Basic Metabolic Panel:  Recent Labs Lab 09/28/16 0516 10/03/16 0149  NA 138 137  K 4.2 3.8  CL 108 102  CO2 24 25  GLUCOSE 98 111*  BUN 10 14  CREATININE 0.81 0.80  CALCIUM 8.7* 8.8*  MG 1.6*  --     GFR: Estimated Creatinine Clearance: 94.3 mL/min (by C-G formula based on SCr of 0.8 mg/dL).  Liver Function Tests:  Recent Labs Lab 09/28/16 0516  AST 21  ALT 15*  ALKPHOS 58  BILITOT 0.2*  PROT 6.8  ALBUMIN 2.3*   No results for input(s): LIPASE, AMYLASE in the last 168 hours. No results for input(s): AMMONIA in the last 168 hours.  Coagulation Profile: No results for input(s): INR, PROTIME in the last 168 hours.  Cardiac Enzymes:  Recent Labs Lab 09/28/16 1542 09/28/16 2108  09/29/16 0529  TROPONINI <0.03 0.04* <0.03    BNP (last 3 results) No results for input(s): PROBNP in the last 8760 hours.  HbA1C: No results for input(s): HGBA1C in the last 72 hours.  CBG:  Recent Labs Lab 10/03/16 0842 10/03/16 1226 10/03/16 1652 10/03/16 2114 10/04/16 0820  GLUCAP 107* 79 137* 138* 94    Lipid Profile: No results for input(s): CHOL, HDL, LDLCALC, TRIG, CHOLHDL, LDLDIRECT in the last 72 hours.  Thyroid Function Tests: No results for input(s): TSH, T4TOTAL, FREET4, T3FREE, THYROIDAB in the last 72 hours.  Anemia Panel: No results for input(s): VITAMINB12, FOLATE, FERRITIN, TIBC, IRON, RETICCTPCT in the last 72 hours.  Urine analysis:    Component Value Date/Time   COLORURINE AMBER (A) 09/26/2016 1835   APPEARANCEUR CLOUDY (A) 09/26/2016 1835   LABSPEC 1.018 09/26/2016 1835   PHURINE 5.0 09/26/2016 1835   GLUCOSEU NEGATIVE 09/26/2016 1835   GLUCOSEU NEGATIVE 09/08/2016 1513   HGBUR LARGE (A) 09/26/2016 1835   BILIRUBINUR NEGATIVE 09/26/2016 1835   BILIRUBINUR  negative 08/17/2016 1359   KETONESUR 20 (A) 09/26/2016 1835   PROTEINUR 30 (A) 09/26/2016 1835   UROBILINOGEN 0.2 09/08/2016 1513   NITRITE POSITIVE (A) 09/26/2016 1835   LEUKOCYTESUR LARGE (A) 09/26/2016 1835    Sepsis Labs: Lactic Acid, Venous    Component Value Date/Time   LATICACIDVEN 1.0 09/27/2016 0355    MICROBIOLOGY: Recent Results (from the past 240 hour(s))  Blood Culture (routine x 2)     Status: Abnormal   Collection Time: 09/26/16  6:35 PM  Result Value Ref Range Status   Specimen Description BLOOD RIGHT ANTECUBITAL  Final   Special Requests BOTTLES DRAWN AEROBIC AND ANAEROBIC 5CC  Final   Culture  Setup Time   Final    GRAM NEGATIVE RODS IN BOTH AEROBIC AND ANAEROBIC BOTTLES Organism ID to follow CRITICAL RESULT CALLED TO, READ BACK BY AND VERIFIED WITH: Denton Brick, PHARM AT 0811 ON [id number] BY Rhea Bleacher    Culture ESCHERICHIA COLI (A)  Final   Report Status 09/29/2016 FINAL  Final   Organism ID, Bacteria ESCHERICHIA COLI  Final      Susceptibility   Escherichia coli - MIC*    AMPICILLIN >=32 RESISTANT Resistant     CEFAZOLIN <=4 SENSITIVE Sensitive     CEFEPIME <=1 SENSITIVE Sensitive     CEFTAZIDIME <=1 SENSITIVE Sensitive     CEFTRIAXONE <=1 SENSITIVE Sensitive     CIPROFLOXACIN <=0.25 SENSITIVE Sensitive     GENTAMICIN <=1 SENSITIVE Sensitive     IMIPENEM <=0.25 SENSITIVE Sensitive     TRIMETH/SULFA <=20 SENSITIVE Sensitive     AMPICILLIN/SULBACTAM 16 INTERMEDIATE Intermediate     PIP/TAZO <=4 SENSITIVE Sensitive     Extended ESBL NEGATIVE Sensitive     * ESCHERICHIA COLI  Urine culture     Status: Abnormal   Collection Time: 09/26/16  6:35 PM  Result Value Ref Range Status   Specimen Description URINE, RANDOM  Final   Special Requests NONE  Final   Culture >=100,000 COLONIES/mL ESCHERICHIA COLI (A)  Final   Report Status 09/28/2016 FINAL  Final   Organism ID, Bacteria ESCHERICHIA COLI (A)  Final      Susceptibility   Escherichia coli - MIC*     AMPICILLIN >=32 RESISTANT Resistant     CEFAZOLIN <=4 SENSITIVE Sensitive     CEFTRIAXONE <=1 SENSITIVE Sensitive     CIPROFLOXACIN <=0.25 SENSITIVE Sensitive     GENTAMICIN <=1 SENSITIVE Sensitive  IMIPENEM <=0.25 SENSITIVE Sensitive     NITROFURANTOIN <=16 SENSITIVE Sensitive     TRIMETH/SULFA <=20 SENSITIVE Sensitive     AMPICILLIN/SULBACTAM 16 INTERMEDIATE Intermediate     PIP/TAZO <=4 SENSITIVE Sensitive     Extended ESBL NEGATIVE Sensitive     * >=100,000 COLONIES/mL ESCHERICHIA COLI  Blood Culture ID Panel (Reflexed)     Status: Abnormal   Collection Time: 09/26/16  6:35 PM  Result Value Ref Range Status   Enterococcus species NOT DETECTED NOT DETECTED Final   Listeria monocytogenes NOT DETECTED NOT DETECTED Final   Staphylococcus species NOT DETECTED NOT DETECTED Final   Staphylococcus aureus NOT DETECTED NOT DETECTED Final   Streptococcus species NOT DETECTED NOT DETECTED Final   Streptococcus agalactiae NOT DETECTED NOT DETECTED Final   Streptococcus pneumoniae NOT DETECTED NOT DETECTED Final   Streptococcus pyogenes NOT DETECTED NOT DETECTED Final   Acinetobacter baumannii NOT DETECTED NOT DETECTED Final   Enterobacteriaceae species DETECTED (A) NOT DETECTED Final    Comment: CRITICAL RESULT CALLED TO, READ BACK BY AND VERIFIED WITH: G. ABBOTT, PHARM AT 0811 ON [id number] BY S. YARBROUGH    Enterobacter cloacae complex NOT DETECTED NOT DETECTED Final   Escherichia coli DETECTED (A) NOT DETECTED Final    Comment: CRITICAL RESULT CALLED TO, READ BACK BY AND VERIFIED WITH: G. ABBOTT, PHARM AT 0811 ON [id number] BY S. YARBROUGH    Klebsiella oxytoca NOT DETECTED NOT DETECTED Final   Klebsiella pneumoniae NOT DETECTED NOT DETECTED Final   Proteus species NOT DETECTED NOT DETECTED Final   Serratia marcescens NOT DETECTED NOT DETECTED Final   Carbapenem resistance NOT DETECTED NOT DETECTED Final   Haemophilus influenzae NOT DETECTED NOT DETECTED Final   Neisseria meningitidis  NOT DETECTED NOT DETECTED Final   Pseudomonas aeruginosa NOT DETECTED NOT DETECTED Final   Candida albicans NOT DETECTED NOT DETECTED Final   Candida glabrata NOT DETECTED NOT DETECTED Final   Candida krusei NOT DETECTED NOT DETECTED Final   Candida parapsilosis NOT DETECTED NOT DETECTED Final   Candida tropicalis NOT DETECTED NOT DETECTED Final  Blood Culture (routine x 2)     Status: None   Collection Time: 09/26/16  6:50 PM  Result Value Ref Range Status   Specimen Description BLOOD LEFT ANTECUBITAL  Final   Special Requests BOTTLES DRAWN AEROBIC AND ANAEROBIC 5CC  Final   Culture NO GROWTH 5 DAYS  Final   Report Status 10/01/2016 FINAL  Final  Culture, blood (routine x 2)     Status: None   Collection Time: 09/28/16  5:15 AM  Result Value Ref Range Status   Specimen Description BLOOD RIGHT HAND  Final   Special Requests BOTTLES DRAWN AEROBIC ONLY [place]  Final   Culture NO GROWTH 5 DAYS  Final   Report Status 10/03/2016 FINAL  Final  Culture, blood (routine x 2)     Status: None   Collection Time: 09/28/16  5:20 AM  Result Value Ref Range Status   Specimen Description BLOOD LEFT HAND  Final   Special Requests IN PEDIATRIC BOTTLE 2CC  Final   Culture NO GROWTH 5 DAYS  Final   Report Status 10/03/2016 FINAL  Final  Culture, blood (routine x 2)     Status: None   Collection Time: 09/28/16  3:35 PM  Result Value Ref Range Status   Specimen Description BLOOD RIGHT HAND  Final   Special Requests IN PEDIATRIC BOTTLE 2CC  Final   Culture NO GROWTH 5 DAYS  Final  Report Status 10/03/2016 FINAL  Final  Culture, blood (routine x 2)     Status: None   Collection Time: 09/28/16  3:40 PM  Result Value Ref Range Status   Specimen Description BLOOD LEFT HAND  Final   Special Requests IN PEDIATRIC BOTTLE [facility]  Final   Culture NO GROWTH 5 DAYS  Final   Report Status 10/03/2016 FINAL  Final    RADIOLOGY STUDIES/RESULTS: Dg Chest 2 View  Result Date: 09/26/2016 CLINICAL DATA:  Chest pain  for several weeks EXAM: CHEST  2 VIEW COMPARISON:  08/17/2016 FINDINGS: The heart size and mediastinal contours are within normal limits. Both lungs are clear. The visualized skeletal structures are unremarkable. IMPRESSION: No active cardiopulmonary disease. Electronically Signed   By: Inez Catalina M.D.   On: 09/26/2016 20:32   Mr Lumbar Spine W Wo Contrast  Result Date: 09/30/2016 CLINICAL DATA:  Back pain. Recent sepsis due to pyelonephritis and E coli bacteremia. EXAM: MRI LUMBAR SPINE WITHOUT AND WITH CONTRAST TECHNIQUE: Multiplanar and multiecho pulse sequences of the lumbar spine were obtained without and with intravenous contrast. CONTRAST:  84mL MULTIHANCE GADOBENATE DIMEGLUMINE 529 MG/ML IV SOLN COMPARISON:  None. FINDINGS: Segmentation:  Normal segmentation. Alignment:  Mild retrolisthesis L1-2 Vertebrae: Negative for fracture or mass. Heterogeneous bone marrow diffusely. Possibly due to anemia. No evidence of discitis. Conus medullaris: Extends to the T12-L1 level and appears normal. Paraspinal and other soft tissues: Bilateral renal cysts. No retroperitoneal mass. Psoas muscles symmetric. There is edema in the paraspinous muscles bilaterally with enhancement suggesting myositis. Disc levels: L1-2: Mild retrolisthesis. Disc and facet degeneration. Mild spinal stenosis. L2-3:  Negative L3-4:  Mild disc and facet degeneration without stenosis L4-5: Diffuse disc bulging. Extensive facet degeneration with bony overgrowth and bilateral facet joint effusions. 5 mm posterior epidural fluid collection is present on the right side probably arising from the facet joint. There is surrounding epidural enhancement above and below the cyst. This could represent a small abscess however could also be noninfected. In addition, there is a 8 mm fluid collection to the left of the L4 spinous process which does not enhance. This could be a small abscess as well. There is surrounding myositis. L5-S1:  Negative  IMPRESSION: Heterogeneous bone marrow with overall benign appearance possibly due to anemia and prominent red marrow Severe facet degeneration at L4-5 with bilateral facet joint effusions. Right posterior epidural fluid collection 5 mm with surrounding epidural enhancement, concerning for epidural abscess versus synovial cyst. 8 mm fluid collection to the left of the L4 spinous process could also represent an abscess although it does not enhance as would be expected. There is edema and enhancement in the erector spinae muscles bilaterally suggesting myositis. Overall the findings are concerning for spinal infection. These results were called by telephone at the time of interpretation on 09/30/2016 at 6:21 pm to Dr. Oren Binet , who verbally acknowledged these results. Electronically Signed   By: Franchot Gallo M.D.   On: 09/30/2016 18:23   Ct Abdomen Pelvis W Contrast  Result Date: 09/26/2016 CLINICAL DATA:  Mid abdominal pain radiating to his back x1 day. EXAM: CT ABDOMEN AND PELVIS WITH CONTRAST TECHNIQUE: Multidetector CT imaging of the abdomen and pelvis was performed using the standard protocol following bolus administration of intravenous contrast. CONTRAST:  144mL ISOVUE-300 IOPAMIDOL (ISOVUE-300) INJECTION 61% COMPARISON:  08/31/2016 go FINDINGS: Lower chest: There is mild bilateral lower lobe bronchiectasis. Clearing of bibasilar atelectasis and/or pneumonia. Top-normal sized heart without pericardial nor pleural effusions. No  pneumothoraces. Hepatobiliary: No space-occupying mass of the liver. Stable 6 mm hypodensity in the left hepatic lobe statistically consistent with a cyst or hemangioma. No intrahepatic ductal dilatation. Tiny dependent gallstones without secondary stoma signs: Cystitis. Pancreas: No pancreatic mass. A few speckled calcifications along the pancreatic tail may be vascular in etiology or related to chronic pancreatitis. No pancreatic ductal dilatation or surrounding  inflammatory changes. Spleen: Normal in size without focal abnormality. Adrenals/Urinary Tract: Normal adrenal glands. Multiple hypodense lesions of both kidneys consistent with cysts remain unchanged. The largest are in the upper pole on the right measuring up to 5.7 cm and on the left 3.7 cm. No obstructive uropathy. No nephrolithiasis. No solid enhancing mass lesions. Mild circumferential thickening of the bladder is again noted which may be related to underdistention and/or cystitis. Stomach/Bowel: The stomach is contracted in appearance. There is moderate colonic stool burden without acute inflammation. Normal-appearing appendix. No small bowel obstruction. Scattered colonic diverticulosis. Vascular/Lymphatic: Aortic atherosclerosis. No enlarged abdominal or pelvic lymph nodes. Reproductive: Irregular enlarged prostate gland containing central zone calcifications are unchanged in appearance. This impresses upon the base of the bladder as before. Other: No free air or free fluid. Musculoskeletal: No acute nor suspicious osseous abnormality. Lower lumbar degenerative facet arthropathy. IMPRESSION: 1. Clearing of bibasilar atelectasis and/or pneumonia since prior exam. Bilateral lower lobe bronchiectasis is incidentally noted. 2. Stable bilateral renal hypodensities consistent with cysts. No obstructive uropathy. 3. Uncomplicated cholelithiasis. 4. Chronic mild circumferential thickening of the urinary bladder as before may be related to cystitis. 5. Stable prostatomegaly. 6. Lower lumbar facet arthropathy. 7. Moderate colonic stool burden. No bowel obstruction or acute inflammation. Electronically Signed   By: Ashley Royalty M.D.   On: 09/26/2016 21:39     LOS: 8 days   Oren Binet, MD  [facility] Pager:[phone]  If 7PM-7AM, please contact night-coverage [email or web address] Password [email or web address] 10/04/2016, 10:57 AM

## 2016-10-04 NOTE — Progress Notes (Signed)
Peripherally Inserted Central Catheter/Midline Placement  The IV Nurse has discussed with the patient and/or persons authorized to consent for the patient, the purpose of this procedure and the potential benefits and risks involved with this procedure.  The benefits include less needle sticks, lab draws from the catheter, and the patient may be discharged home with the catheter. Risks include, but not limited to, infection, bleeding, blood clot (thrombus formation), and puncture of an artery; nerve damage and irregular heartbeat and possibility to perform a PICC exchange if needed/ordered by physician.  Alternatives to this procedure were also discussed.  B[employer] Power PICC patient education guide, fact sheet on infection prevention and patient information card has been provided to patient /or left at bedside.    PICC/Midline Placement Documentation        Henderson [place] 10/04/2016, 10:53 AM

## 2016-10-04 NOTE — Progress Notes (Signed)
Patient refuses the use of CPAP 

## 2016-10-04 NOTE — Progress Notes (Signed)
  Remains afebrile. Still having some back pain  Assessment : ecoli bacteremia with small epidural abscess/myositis  Plan: - 6 wk of IV ceftriaxone 2gm Q 12 hr, currently on day 9 of 42. - TEE pending for tuesdsay.  - home health orders listed below - will arrange for follow up in ID clinic - can get picc line - will sign off  Diagnosis: Epidural abscess  Culture Result: ecoli  Allergies  Allergen Reactions  . Metformin And Related Diarrhea  . Codeine Rash    All over the body    Discharge antibiotics: Per pharmacy protocol  Ceftriaxone 2gm iv Q 12  Duration:  42 days End Date: Feb 4  [facility] Care Per Protocol:  Labs weekly while on IV antibiotics: _x_ CBC with differential _x_ BMP __ CMP _x_ CRP _x_ ESR   _x_ Please pull PIC at completion of IV antibiotics  Fax weekly labs to [phone]  Clinic Follow Up Appt: In 4-5 wk  @ [facility]

## 2016-10-04 NOTE — NC FL2 (Signed)
[place] LEVEL OF CARE SCREENING TOOL     IDENTIFICATION  Patient Name: Charles Parker Birthdate: Dec 22, 1945 Sex: male Admission Date (Current Location): 09/26/2016  [facility] and [place] Number:  Herbalist and Address:  [facility] [place], [place] [address], [place], [place] [address]      Provider Number: [id number]  Attending Physician Name and Address:  Jonetta Osgood, MD  Relative Name and Phone Number:  Huynh, [place]oy; [phone] (home), [phone] (mobile);  Germer,Stahle-Brother; [phone] (mobile)     Current Level of Care: Hospital Recommended Level of Care: [place] Prior Approval Number:    Date Approved/Denied:   PASRR Number: [id number] (Eff. 10/04/16)  Discharge Plan: SNF    Current Diagnoses: Patient Active Problem List   Diagnosis Date Noted  . Elevated lactic acid level   . Sepsis due to urinary tract infection ([place]) 09/26/2016  . Diabetes mellitus, type II, insulin dependent ([place]) 09/26/2016  . Sepsis secondary to UTI ([place]) 09/26/2016  . Palpitations 09/08/2016  . E coli bacteremia 08/19/2016  . Acute pyelonephritis 08/17/2016  . Rotator cuff tendinitis 12/12/2015  . Diabetes mellitus with ophthalmic complication ([place]) [id number]  . Tremor 11/11/2015  . Overgrown toenails 11/11/2015  . Primary osteoarthritis of both knees 07/14/2015  . Acute prostatitis 11/01/2014  . DVT, lower extremity, proximal ([place]) 07/10/2014  . Routine general medical examination at a health care facility 06/21/2013  . Hyperlipidemia with target LDL less than 70 06/20/2013  . Benign prostatic hyperplasia (BPH) with straining on urination 06/20/2013  . Brain atrophy 06/20/2013  . OSA (obstructive sleep apnea) 06/20/2013  . Depression with anxiety 06/20/2013  . 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 12/27/2011  . Hypertension      Orientation RESPIRATION BLADDER Height & Weight     Self, Time, Situation, Place  Normal, Other (Comment) (CPAP at bedtime) External catheter, Continent (Condom catheter) Weight: 175 lb 1.6 oz (79.4 kg) Height:  6' (182.9 cm)  BEHAVIORAL SYMPTOMS/MOOD NEUROLOGICAL BOWEL NUTRITION STATUS      Continent Diet (Carb modified)  AMBULATORY STATUS COMMUNICATION OF NEEDS Skin   Limited Assist Verbally Normal                       Personal Care Assistance Level of Assistance  Bathing, Feeding, Dressing Bathing Assistance: Limited assistance Feeding assistance: Independent Dressing Assistance: Limited assistance     Functional Limitations Info  Sight, Hearing, Speech Sight Info: Impaired (Glaucoma) Hearing Info: Impaired Speech Info: Adequate    SPECIAL CARE FACTORS FREQUENCY  PT (By licensed PT)     PT Frequency: Evaluated 12/27 and a minimum of 3X per week therapy recommended              Contractures Contractures Info: Not present    Additional Factors Info  Code Status, Allergies, Insulin Sliding Scale Code Status Info: Full Allergies Info: Metformin, Codeine   Insulin Sliding Scale Info: 0-15 Units 3X per day with meals. 0-5 Units at bedtime       Current Medications (10/04/2016):  This is the current hospital active medication list Current Facility-Administered Medications  Medication Dose Route Frequency Provider Last Rate Last Dose  . cefTRIAXone (ROCEPHIN) 2 g in dextrose 5 % 50 mL IVPB  2 g Intravenous Q12H Jonetta Osgood, MD   2 g at 10/03/16 2109  . HYDROcodone-acetaminophen (NORCO/VICODIN) 5-325 MG per tablet 1 tablet  1 tablet  Oral Q6H PRN Vianne Bulls, MD   1 tablet at 10/04/16 0804  . insulin aspart (novoLOG) injection 0-15 Units  0-15 Units Subcutaneous TID WC Vianne Bulls, MD   2 Units at 10/03/16 1716  . insulin aspart (novoLOG) injection 0-5 Units  0-5 Units Subcutaneous QHS Ilene Qua Opyd, MD      . insulin glargine (LANTUS) injection 25  Units  25 Units Subcutaneous QHS Jonetta Osgood, MD   25 Units at 10/03/16 2112  . methocarbamol (ROBAXIN) tablet 500 mg  500 mg Oral TID Lavina Hamman, MD   500 mg at 10/03/16 1610  . morphine 4 MG/ML injection 2 mg  2 mg Intravenous Q4H PRN Vianne Bulls, MD   2 mg at 09/29/16 1151  . multivitamin with minerals tablet 1 tablet  1 tablet Oral Daily Vianne Bulls, MD   1 tablet at 10/03/16 1159  . ondansetron (ZOFRAN) tablet 4 mg  4 mg Oral Q6H PRN Vianne Bulls, MD       Or  . ondansetron (ZOFRAN) injection 4 mg  4 mg Intravenous Q6H PRN Vianne Bulls, MD      . oxyCODONE-acetaminophen (PERCOCET/ROXICET) 5-325 MG per tablet 1 tablet  1 tablet Oral Q6H PRN Shanker Kristeen Mans, MD      . polyethylene glycol (MIRALAX / GLYCOLAX) packet 17 g  17 g Oral Daily Lavina Hamman, MD   17 g at 10/03/16 1200  . rivaroxaban (XARELTO) tablet 20 mg  20 mg Oral Q breakfast Vianne Bulls, MD   20 mg at 10/04/16 0804  . simvastatin (ZOCOR) tablet 40 mg  40 mg Oral q1800 Vianne Bulls, MD   40 mg at 10/03/16 1715  . sodium chloride flush (NS) 0.9 % injection 3 mL  3 mL Intravenous Q12H Ilene Qua Opyd, MD   3 mL at 10/03/16 2112  . tamsulosin (FLOMAX) capsule 0.4 mg  0.4 mg Oral Daily Vianne Bulls, MD   0.4 mg at 10/03/16 1200     Discharge Medications: Please see discharge summary for a list of discharge medications.  Relevant Imaging Results:  Relevant Lab Results:   Additional Information [id number];   Sable Feil, LCSW

## 2016-10-04 NOTE — Clinical Social Work Note (Signed)
Clinical Social Work Assessment  Patient Details  Name: Charles Parker MRN: [id number] Date of Birth: Mar 22, 1946  Date of referral:  10/04/16               Reason for consult:  Facility Placement                Permission sought to share information with:  Family Supports Permission granted to share information::  Yes, Verbal Permission Granted  Name::     Charles Parker and [place]::     Relationship::  Brothers  Contact Information:  Charles Parker [id number] [phone] and Charles Parker - [phone]  Housing/Transportation Living arrangements for the past 2 months:  [place] of Information:  Patient, Other (Comment Required) (Brother Charles Parker) Patient Interpreter Needed:  None Criminal Activity/Legal Involvement Pertinent to Current Situation/Hospitalization:  No - Comment as needed Significant Relationships:  Siblings Lives with:  Self Do you feel safe going back to the place where you live?  No (Patient agreeable to ST rehab at skilled facility as he lives alone) Need for family participation in patient care:  Yes (Comment) (Patient was agreeable to CSW contacting brother Charles Parker)  Care giving concerns: Patient in agreement that short-term rehab needed before returning home as he lives alone.   Social Worker assessment / plan:  CSW talked with patient at the bedside regarding discharge plan and recommendation of ST rehab. Patient was lying in bed and was awake, alert and agreeable to speaking with CSW regarding ST rehab. Charles Parker agreeable to rehab and reported that he is familiar with some facilities as he has family that has been to skilled facilities before for rehab. Patient agreeable to his brother Charles Parker being contacted to assist with facility decision. While in room with patient his brother Charles Parker entered and listened in on conversation.   Employment status:  Retired Forensic scientist:  [employer], [employer]) PT Recommendations:  [place] / Referral to community resources:  [place] (Patient provided with SNF list for [place])  Patient/Family's Response to care:  No concerns expressed by patient or brothers regarding care during hospitalization.  Patient/Family's Understanding of and Emotional Response to Diagnosis, Current Treatment, and Prognosis:  Not discussed.  Emotional Assessment Appearance:  Appears stated age Attitude/Demeanor/Rapport:  Other (Appropriate) Affect (typically observed):  Appropriate, Pleasant Orientation:  Oriented to Self, Oriented to Place, Oriented to  Time, Oriented to Situation Alcohol / Substance use:  Tobacco Use, Alcohol Use, Illicit Drugs (Patient reported that he does not smoke, drinks alcohol and does not use illicit drugs) Psych involvement (Current and /or in the community):  No (Comment)  Discharge Needs  Concerns to be addressed:  Discharge Planning Concerns Readmission within the last 30 days:  No Current discharge risk:  None Barriers to Discharge:  No Barriers Identified   Sable Feil, LCSW 10/04/2016, 4:42 PM

## 2016-10-05 ENCOUNTER — Encounter (HOSPITAL_COMMUNITY): Payer: Self-pay

## 2016-10-05 ENCOUNTER — Inpatient Hospital Stay (HOSPITAL_COMMUNITY): Payer: Commercial Managed Care - HMO

## 2016-10-05 ENCOUNTER — Encounter (HOSPITAL_COMMUNITY)
Admission: EM | Disposition: A | Discharge status: Skilled Nursing Facility | Payer: Self-pay | Source: Home / Self Care | Attending: Internal Medicine

## 2016-10-05 DIAGNOSIS — R7881 Bacteremia: Secondary | ICD-10-CM

## 2016-10-05 DIAGNOSIS — M4647 Discitis, unspecified, lumbosacral region: Secondary | ICD-10-CM

## 2016-10-05 HISTORY — PX: TEE WITHOUT CARDIOVERSION: SHX5443

## 2016-10-05 LAB — GLUCOSE, CAPILLARY
GLUCOSE-CAPILLARY: 114 mg/dL — AB (ref 65–99)
Glucose-Capillary: 188 mg/dL — ABNORMAL HIGH (ref 65–99)
Glucose-Capillary: 114 mg/dL — ABNORMAL HIGH (ref 65–99)
Glucose-Capillary: 156 mg/dL — ABNORMAL HIGH (ref 65–99)

## 2016-10-05 SURGERY — ECHOCARDIOGRAM, TRANSESOPHAGEAL
Anesthesia: Moderate Sedation

## 2016-10-05 MED ORDER — HYDROCODONE-ACETAMINOPHEN 5-325 MG PO TABS: 1.0000 | ORAL_TABLET | Freq: Four times a day (QID) | ORAL | 0 refills | Status: DC | PRN

## 2016-10-05 MED ORDER — CEFTRIAXONE IV (FOR PTA / DISCHARGE USE ONLY): 2.0000 g | Freq: Two times a day (BID) | INTRAVENOUS | 0 refills | Status: DC

## 2016-10-05 MED ORDER — SODIUM CHLORIDE 0.9 % IV SOLN
INTRAVENOUS | Status: DC
  Administered 2016-10-05: 500 mL via INTRAVENOUS

## 2016-10-05 MED ORDER — INSULIN GLARGINE 100 UNIT/ML SOLOSTAR PEN: PEN_INJECTOR | SUBCUTANEOUS | 0 refills | Status: DC

## 2016-10-05 MED ORDER — FENTANYL CITRATE (PF) 100 MCG/2ML IJ SOLN
INTRAMUSCULAR | Status: AC
  Filled 2016-10-05: qty 2

## 2016-10-05 MED ORDER — FENTANYL CITRATE (PF) 100 MCG/2ML IJ SOLN
INTRAMUSCULAR | Status: DC | PRN
  Administered 2016-10-05: 25 ug via INTRAVENOUS

## 2016-10-05 MED ORDER — MIDAZOLAM HCL 5 MG/ML IJ SOLN
INTRAMUSCULAR | Status: AC
  Filled 2016-10-05: qty 2

## 2016-10-05 MED ORDER — POLYETHYLENE GLYCOL 3350 17 G PO PACK: 17.0000 g | PACK | Freq: Two times a day (BID) | ORAL | 0 refills | Status: DC

## 2016-10-05 MED ORDER — BUTAMBEN-TETRACAINE-BENZOCAINE 2-2-14 % EX AERO
INHALATION_SPRAY | CUTANEOUS | Status: DC | PRN
  Administered 2016-10-05: 2 via TOPICAL

## 2016-10-05 MED ORDER — MIDAZOLAM HCL 10 MG/2ML IJ SOLN
INTRAMUSCULAR | Status: DC | PRN
  Administered 2016-10-05: 2 mg via INTRAVENOUS

## 2016-10-05 MED ORDER — METHOCARBAMOL 500 MG PO TABS: 500.0000 mg | ORAL_TABLET | Freq: Three times a day (TID) | ORAL | Status: DC | PRN

## 2016-10-05 NOTE — Discharge Summary (Addendum)
PATIENT DETAILS Name: Charles Parker Age: 71 y.o. Sex: male Date of Birth: 02-04-1946 MRN: [id number]. Admitting Physician: Vianne Bulls, MD NOB:[id number] Charles Ramp, MD  Admit Date: 09/26/2016 Discharge date: 10/05/2016  Recommendations for Outpatient Follow-up:  1. Follow up with PCP in 1-2 weeks 2. Ensure follow up at the infectious disease clinic 3. Please pull PICC line once the patient completes IV antibiotic therapy 4. Please see medication list below regarding labs, duration of IV antimicrobial therapy. 5. Repeat MRI of the lumbosacral spine once patient completes antimicrobial therapy  Admitted From:  Home   Disposition: SNF   Home Health: No  Equipment/Devices: PICC Line 10/04/16  Discharge Condition: Stable  CODE STATUS: FULL CODE  Diet recommendation:  Heart Healthy / Carb Modified   Brief Summary: See H&P, Labs, Consult and Test reports for all details in brief, Patient is a 71 y.o. male with recent hospitalization (11/14-11/17) for sepsis due to pyelonephritis and Escherichia coli bacteremia admitted on 12/24 with fever/back pain/left flank pain-blood cultures again positive for Escherichia coli. Initially thought to have recurrent UTI/pyelonephritis, but since back pain persisted-underwent MRI of the brain that shows epidural abscess/phlegmon/osteomyelitis. See below for further details.   Brief Hospital Course: Sepsis due to E coli bacteremia/pyelonephritis/ L4-L5 epidural abscess with osteomyelitis and phlegmon formation: Sepsis pathophysiology has resolved, continue Rocephin-last day on 2/4(see below med list for required labs while on Rocephin-please fax labs to [facility]). Repeat blood cultures on 12/26 already negative. Given MRI findings, infectious disease was consulted, recommendations are to continue Rocephin on discharge. TEE on 10/05/16 was negative for endocarditis.  Will likely need a repeat MRI of the LS-spine once patient completes antimicrobial  therapy. Note no focal neurological deficits on exam.  Note:discussed with neurosurgeon- Dr. Marland Kitchen Parker over the phone on 12/28-who reviewed the MRI results, and thought that patient probably had osteomyelitis of the area noted above and recommended medical management.  Acute kidney injury: Probably mild prerenal azotemia, resolved.  Insulin-dependent diabetes:  CBG stable with Lantus 25 units. Please follow CBGs closely while at SNF.   Dyslipidemia: Continue simvastatin  Hypertension:Pressure appears stable, lisinopril remains on hold-consider resuming over the next few weeks if blood pressure starts increasing.  BPH: Continue Flomax  History of venous thromboembolism: Continue Xarelto-per PCPs note-needs lifelong anticoagulation  Constipation: Likely secondary to narcotics that he has been receiving for back pain-continue with MiraLAX.    MOQ:HUTMLYYT CPAP qhs-but patient has refused numerous times during this hospital stay  Procedures/Studies: TEE 1/2>> no vegetation PICC Line 10/04/16  Discharge Diagnoses:  Principal Problem:   Sepsis due to urinary tract infection ([place]) Active Problems:   Hypertension   OSA (obstructive sleep apnea)   Depression with anxiety   DVT, lower extremity, proximal (HCC)   Diabetes mellitus, type II, insulin dependent (HCC)   Sepsis secondary to UTI (HCC)   Elevated lactic acid level   Staphylococcus aureus bacteremia   Discharge Instructions:  Activity:  As tolerated with Full fall precautions use walker/cane & assistance as needed  Discharge Instructions    Diet - low sodium heart healthy    Complete by:  As directed    Diet Carb Modified    Complete by:  As directed    Discharge instructions    Complete by:  As directed    Check CBGs before meals and at bedtime   Home infusion instructions [place] May follow [place] Dosing Protocol; May administer Cathflo as needed to maintain patency of vascular access  device.; Flushing of vascular access device: per [facility] Protocol: 0.9% NaCl pre/post medica...    Complete by:  As directed    Instructions:  May follow [place] Dosing Protocol   Instructions:  May administer Cathflo as needed to maintain patency of vascular access device.   Instructions:  Flushing of vascular access device: per [facility] [place] Protocol: 0.9% NaCl pre/post medication administration and prn patency; Heparin 100 u/ml, 33m for implanted ports and Heparin 10u/ml, 557mfor all other central venous catheters.   Instructions:  May follow [facility] Anaphylaxis Protocol for First Dose Administration in the home: 0.9% NaCl at 25-50 ml/hr to maintain IV access for protocol meds. Epinephrine 0.3 ml IV/IM PRN and Benadryl 25-50 IV/IM PRN s/s of anaphylaxis.   Instructions:  [facility] Coordinator (RN) to assist per patient IV care needs in the home PRN.   Increase activity slowly    Complete by:  As directed      Allergies as of 10/05/2016      Reactions   Metformin And Related Diarrhea   Codeine Rash   All over the body      Medication List    STOP taking these medications   B-D SINGLE USE SWABS REGULAR Pads   glucose blood test strip Commonly known as:  TRUE METRIX BLOOD GLUCOSE TEST   ibuprofen 200 MG tablet Commonly known as:  ADVIL,MOTRIN   lisinopril 5 MG tablet Commonly known as:  PRINIVIL,ZESTRIL   TRUE METRIX METER w/Device Kit   TRUEPLUS LANCETS 33G Misc     TAKE these medications   cefTRIAXone IVPB Commonly known as:  ROCEPHIN Inject 2 g into the vein every 12 (twelve) hours. Indication:  Ecoli bacteremia with small epidural abscess/myositis Last Day of Therapy:  11/07/16 Labs - Once weekly:  CBC/D and BMP, Labs - Every other week:  ESR and CRP   HYDROcodone-acetaminophen 5-325 MG tablet Commonly known as:  NORCO/VICODIN Take 1 tablet by mouth every 6 (six) hours as needed for moderate pain.   Insulin Glargine 100 UNIT/ML Solostar Pen Commonly known as:   LANTUS SOLOSTAR Inject 25 units every night at bedtime What changed:  additional instructions   Insulin Pen Needle 31G X 5 MM Misc Administer lantus once nightly   methocarbamol 500 MG tablet Commonly known as:  ROBAXIN Take 1 tablet (500 mg total) by mouth every 8 (eight) hours as needed for muscle spasms.   multivitamin with minerals Tabs tablet Take 1 tablet by mouth daily.   polyethylene glycol packet Commonly known as:  MIRALAX / GLYCOLAX Take 17 g by mouth 2 (two) times daily.   rivaroxaban 20 MG Tabs tablet Commonly known as:  XARELTO Take 1 tablet (20 mg total) by mouth daily with supper. What changed:  when to take this   simvastatin 40 MG tablet Commonly known as:  ZOCOR TAKE 1 TABLET(40 MG) BY MOUTH EVERY EVENING What changed:  See the new instructions.   tamsulosin 0.4 MG Caps capsule Commonly known as:  FLOMAX Take 0.4 mg by mouth daily after breakfast.            Home Infusion Instuctions        Start     Ordered   10/05/16 0000  [facility] instructions [facility]; May administer Cathflo as needed to maintain patency of vascular access device.; [facility]: 0.9% NaCl pre/post medica...    Question Answer Comment  Instructions  May follow [place] Dosing Protocol   Instructions May administer Cathflo as needed to maintain patency of vascular access device.   Instructions Flushing of vascular access device: per [facility] Protocol: 0.9% NaCl pre/post medication administration and prn patency; Heparin 100 u/ml, 73m for implanted ports and Heparin 10u/ml, 578mfor all other central venous catheters.   Instructions May follow [facility] Anaphylaxis Protocol for First Dose Administration in the home: 0.9% NaCl at 25-50 ml/hr to maintain IV access for protocol meds. Epinephrine 0.3 ml IV/IM PRN and Benadryl 25-50 IV/IM PRN s/s of anaphylaxis.   Instructions [facility]  Infusion Coordinator (RN) to assist per patient IV care needs in the home PRN.      10/05/16 1010      Allergies  Allergen Reactions  . Metformin And Related Diarrhea  . Codeine Rash    All over the body    Consultations:   ID  Other Procedures/Studies: Dg Chest 2 View  Result Date: 09/26/2016 CLINICAL DATA:  Chest pain for several weeks EXAM: CHEST  2 VIEW COMPARISON:  08/17/2016 FINDINGS: The heart size and mediastinal contours are within normal limits. Both lungs are clear. The visualized skeletal structures are unremarkable. IMPRESSION: No active cardiopulmonary disease. Electronically Signed   By: MaInez Catalina.D.   On: 09/26/2016 20:32   Mr Lumbar Spine W Wo Contrast  Result Date: 09/30/2016 CLINICAL DATA:  Back pain. Recent sepsis due to pyelonephritis and E coli bacteremia. EXAM: MRI LUMBAR SPINE WITHOUT AND WITH CONTRAST TECHNIQUE: Multiplanar and multiecho pulse sequences of the lumbar spine were obtained without and with intravenous contrast. CONTRAST:  2037mULTIHANCE GADOBENATE DIMEGLUMINE 529 MG/ML IV SOLN COMPARISON:  None. FINDINGS: Segmentation:  Normal segmentation. Alignment:  Mild retrolisthesis L1-2 Vertebrae: Negative for fracture or mass. Heterogeneous bone marrow diffusely. Possibly due to anemia. No evidence of discitis. Conus medullaris: Extends to the T12-L1 level and appears normal. Paraspinal and other soft tissues: Bilateral renal cysts. No retroperitoneal mass. Psoas muscles symmetric. There is edema in the paraspinous muscles bilaterally with enhancement suggesting myositis. Disc levels: L1-2: Mild retrolisthesis. Disc and facet degeneration. Mild spinal stenosis. L2-3:  Negative L3-4:  Mild disc and facet degeneration without stenosis L4-5: Diffuse disc bulging. Extensive facet degeneration with bony overgrowth and bilateral facet joint effusions. 5 mm posterior epidural fluid collection is present on the right side probably arising from the facet joint.  There is surrounding epidural enhancement above and below the cyst. This could represent a small abscess however could also be noninfected. In addition, there is a 8 mm fluid collection to the left of the L4 spinous process which does not enhance. This could be a small abscess as well. There is surrounding myositis. L5-S1:  Negative IMPRESSION: Heterogeneous bone marrow with overall benign appearance possibly due to anemia and prominent red marrow Severe facet degeneration at L4-5 with bilateral facet joint effusions. Right posterior epidural fluid collection 5 mm with surrounding epidural enhancement, concerning for epidural abscess versus synovial cyst. 8 mm fluid collection to the left of the L4 spinous process could also represent an abscess although it does not enhance as would be expected. There is edema and enhancement in the erector spinae muscles bilaterally suggesting myositis. Overall the findings are concerning for spinal infection. These results were called by telephone at the time of interpretation on 09/30/2016 at 6:21 pm to Dr. SHAOren Binetwho verbally acknowledged these results. Electronically Signed   By: ChaFranchot GalloD.   On: 09/30/2016 18:23  Ct Abdomen Pelvis W Contrast  Result Date: 09/26/2016 CLINICAL DATA:  Mid abdominal pain radiating to his back x1 day. EXAM: CT ABDOMEN AND PELVIS WITH CONTRAST TECHNIQUE: Multidetector CT imaging of the abdomen and pelvis was performed using the standard protocol following bolus administration of intravenous contrast. CONTRAST:  131m ISOVUE-300 IOPAMIDOL (ISOVUE-300) INJECTION 61% COMPARISON:  08/31/2016 go FINDINGS: Lower chest: There is mild bilateral lower lobe bronchiectasis. Clearing of bibasilar atelectasis and/or pneumonia. Top-normal sized heart without pericardial nor pleural effusions. No pneumothoraces. Hepatobiliary: No space-occupying mass of the liver. Stable 6 mm hypodensity in the left hepatic lobe statistically consistent  with a cyst or hemangioma. No intrahepatic ductal dilatation. Tiny dependent gallstones without secondary stoma signs: Cystitis. Pancreas: No pancreatic mass. A few speckled calcifications along the pancreatic tail may be vascular in etiology or related to chronic pancreatitis. No pancreatic ductal dilatation or surrounding inflammatory changes. Spleen: Normal in size without focal abnormality. Adrenals/Urinary Tract: Normal adrenal glands. Multiple hypodense lesions of both kidneys consistent with cysts remain unchanged. The largest are in the upper pole on the right measuring up to 5.7 cm and on the left 3.7 cm. No obstructive uropathy. No nephrolithiasis. No solid enhancing mass lesions. Mild circumferential thickening of the bladder is again noted which may be related to underdistention and/or cystitis. Stomach/Bowel: The stomach is contracted in appearance. There is moderate colonic stool burden without acute inflammation. Normal-appearing appendix. No small bowel obstruction. Scattered colonic diverticulosis. Vascular/Lymphatic: Aortic atherosclerosis. No enlarged abdominal or pelvic lymph nodes. Reproductive: Irregular enlarged prostate gland containing central zone calcifications are unchanged in appearance. This impresses upon the base of the bladder as before. Other: No free air or free fluid. Musculoskeletal: No acute nor suspicious osseous abnormality. Lower lumbar degenerative facet arthropathy. IMPRESSION: 1. Clearing of bibasilar atelectasis and/or pneumonia since prior exam. Bilateral lower lobe bronchiectasis is incidentally noted. 2. Stable bilateral renal hypodensities consistent with cysts. No obstructive uropathy. 3. Uncomplicated cholelithiasis. 4. Chronic mild circumferential thickening of the urinary bladder as before may be related to cystitis. 5. Stable prostatomegaly. 6. Lower lumbar facet arthropathy. 7. Moderate colonic stool burden. No bowel obstruction or acute inflammation.  Electronically Signed   By: DAshley RoyaltyM.D.   On: 09/26/2016 21:39      TODAY-DAY OF DISCHARGE:  Subjective:   AAyvin Lipinskitoday has no headache,no chest abdominal pain,no new weakness tingling or numbness, feels much better wants to go home today.   Objective:   Blood pressure 124/84, pulse 89, temperature 98.6 F (37 C), temperature source Oral, resp. rate 18, height 6' (1.829 m), weight 80.5 kg (177 lb 7.5 oz), SpO2 99 %.  Intake/Output Summary (Last 24 hours) at 10/05/16 1010 Last data filed at 10/05/16 0757  Gross per 24 hour  Intake              658 ml  Output             1920 ml  Net            -1262 ml   Filed Weights   09/29/16 2023 09/30/16 2112 10/04/16 2049  Weight: 84.3 kg (185 lb 13.6 oz) 79.4 kg (175 lb 1.6 oz) 80.5 kg (177 lb 7.5 oz)    Exam: Awake Alert, Oriented *3, No new F.N deficits, Normal affect [place].AT,PERRAL Supple Neck,No JVD, No cervical lymphadenopathy appriciated.  Symmetrical Chest wall movement, Good air movement bilaterally, CTAB RRR,No Gallops,Rubs or new Murmurs, No Parasternal Heave +ve B.Sounds, Abd Soft, Non tender, No organomegaly  appriciated, No rebound -guarding or rigidity. No Cyanosis, Clubbing or edema, No new Rash or bruise   PERTINENT RADIOLOGIC STUDIES: Dg Chest 2 View  Result Date: 09/26/2016 CLINICAL DATA:  Chest pain for several weeks EXAM: CHEST  2 VIEW COMPARISON:  08/17/2016 FINDINGS: The heart size and mediastinal contours are within normal limits. Both lungs are clear. The visualized skeletal structures are unremarkable. IMPRESSION: No active cardiopulmonary disease. Electronically Signed   By: Inez Catalina M.D.   On: 09/26/2016 20:32   Mr Lumbar Spine W Wo Contrast  Result Date: 09/30/2016 CLINICAL DATA:  Back pain. Recent sepsis due to pyelonephritis and E coli bacteremia. EXAM: MRI LUMBAR SPINE WITHOUT AND WITH CONTRAST TECHNIQUE: Multiplanar and multiecho pulse sequences of the lumbar spine were obtained  without and with intravenous contrast. CONTRAST:  31m MULTIHANCE GADOBENATE DIMEGLUMINE 529 MG/ML IV SOLN COMPARISON:  None. FINDINGS: Segmentation:  Normal segmentation. Alignment:  Mild retrolisthesis L1-2 Vertebrae: Negative for fracture or mass. Heterogeneous bone marrow diffusely. Possibly due to anemia. No evidence of discitis. Conus medullaris: Extends to the T12-L1 level and appears normal. Paraspinal and other soft tissues: Bilateral renal cysts. No retroperitoneal mass. Psoas muscles symmetric. There is edema in the paraspinous muscles bilaterally with enhancement suggesting myositis. Disc levels: L1-2: Mild retrolisthesis. Disc and facet degeneration. Mild spinal stenosis. L2-3:  Negative L3-4:  Mild disc and facet degeneration without stenosis L4-5: Diffuse disc bulging. Extensive facet degeneration with bony overgrowth and bilateral facet joint effusions. 5 mm posterior epidural fluid collection is present on the right side probably arising from the facet joint. There is surrounding epidural enhancement above and below the cyst. This could represent a small abscess however could also be noninfected. In addition, there is a 8 mm fluid collection to the left of the L4 spinous process which does not enhance. This could be a small abscess as well. There is surrounding myositis. L5-S1:  Negative IMPRESSION: Heterogeneous bone marrow with overall benign appearance possibly due to anemia and prominent red marrow Severe facet degeneration at L4-5 with bilateral facet joint effusions. Right posterior epidural fluid collection 5 mm with surrounding epidural enhancement, concerning for epidural abscess versus synovial cyst. 8 mm fluid collection to the left of the L4 spinous process could also represent an abscess although it does not enhance as would be expected. There is edema and enhancement in the erector spinae muscles bilaterally suggesting myositis. Overall the findings are concerning for spinal infection.  These results were called by telephone at the time of interpretation on 09/30/2016 at 6:21 pm to Dr. SOren Binet, who verbally acknowledged these results. Electronically Signed   By: CFranchot GalloM.D.   On: 09/30/2016 18:23   Ct Abdomen Pelvis W Contrast  Result Date: 09/26/2016 CLINICAL DATA:  Mid abdominal pain radiating to his back x1 day. EXAM: CT ABDOMEN AND PELVIS WITH CONTRAST TECHNIQUE: Multidetector CT imaging of the abdomen and pelvis was performed using the standard protocol following bolus administration of intravenous contrast. CONTRAST:  1034mISOVUE-300 IOPAMIDOL (ISOVUE-300) INJECTION 61% COMPARISON:  08/31/2016 go FINDINGS: Lower chest: There is mild bilateral lower lobe bronchiectasis. Clearing of bibasilar atelectasis and/or pneumonia. Top-normal sized heart without pericardial nor pleural effusions. No pneumothoraces. Hepatobiliary: No space-occupying mass of the liver. Stable 6 mm hypodensity in the left hepatic lobe statistically consistent with a cyst or hemangioma. No intrahepatic ductal dilatation. Tiny dependent gallstones without secondary stoma signs: Cystitis. Pancreas: No pancreatic mass. A few speckled calcifications along the pancreatic tail may be vascular in  etiology or related to chronic pancreatitis. No pancreatic ductal dilatation or surrounding inflammatory changes. Spleen: Normal in size without focal abnormality. Adrenals/Urinary Tract: Normal adrenal glands. Multiple hypodense lesions of both kidneys consistent with cysts remain unchanged. The largest are in the upper pole on the right measuring up to 5.7 cm and on the left 3.7 cm. No obstructive uropathy. No nephrolithiasis. No solid enhancing mass lesions. Mild circumferential thickening of the bladder is again noted which may be related to underdistention and/or cystitis. Stomach/Bowel: The stomach is contracted in appearance. There is moderate colonic stool burden without acute inflammation. Normal-appearing  appendix. No small bowel obstruction. Scattered colonic diverticulosis. Vascular/Lymphatic: Aortic atherosclerosis. No enlarged abdominal or pelvic lymph nodes. Reproductive: Irregular enlarged prostate gland containing central zone calcifications are unchanged in appearance. This impresses upon the base of the bladder as before. Other: No free air or free fluid. Musculoskeletal: No acute nor suspicious osseous abnormality. Lower lumbar degenerative facet arthropathy. IMPRESSION: 1. Clearing of bibasilar atelectasis and/or pneumonia since prior exam. Bilateral lower lobe bronchiectasis is incidentally noted. 2. Stable bilateral renal hypodensities consistent with cysts. No obstructive uropathy. 3. Uncomplicated cholelithiasis. 4. Chronic mild circumferential thickening of the urinary bladder as before may be related to cystitis. 5. Stable prostatomegaly. 6. Lower lumbar facet arthropathy. 7. Moderate colonic stool burden. No bowel obstruction or acute inflammation. Electronically Signed   By: Ashley Royalty M.D.   On: 09/26/2016 21:39     PERTINENT LAB RESULTS: CBC:  Recent Labs  10/03/16 0149  WBC 9.4  HGB 9.2*  HCT 29.2*  PLT 346   CMET CMP     Component Value Date/Time   NA 137 10/03/2016 0149   K 3.8 10/03/2016 0149   CL 102 10/03/2016 0149   CO2 25 10/03/2016 0149   GLUCOSE 111 (H) 10/03/2016 0149   BUN 14 10/03/2016 0149   CREATININE 0.80 10/03/2016 0149   CALCIUM 8.8 (L) 10/03/2016 0149   PROT 6.8 09/28/2016 0516   ALBUMIN 2.3 (L) 09/28/2016 0516   AST 21 09/28/2016 0516   ALT 15 (L) 09/28/2016 0516   ALKPHOS 58 09/28/2016 0516   BILITOT 0.2 (L) 09/28/2016 0516   GFRNONAA >60 10/03/2016 0149   GFRAA >60 10/03/2016 0149    GFR Estimated Creatinine Clearance: 94.3 mL/min (by C-G formula based on SCr of 0.8 mg/dL). No results for input(s): LIPASE, AMYLASE in the last 72 hours. No results for input(s): CKTOTAL, CKMB, CKMBINDEX, TROPONINI in the last 72 hours. Invalid  input(s): POCBNP No results for input(s): DDIMER in the last 72 hours. No results for input(s): HGBA1C in the last 72 hours. No results for input(s): CHOL, HDL, LDLCALC, TRIG, CHOLHDL, LDLDIRECT in the last 72 hours. No results for input(s): TSH, T4TOTAL, T3FREE, THYROIDAB in the last 72 hours.  Invalid input(s): FREET3 No results for input(s): VITAMINB12, FOLATE, FERRITIN, TIBC, IRON, RETICCTPCT in the last 72 hours. Coags: No results for input(s): INR in the last 72 hours.  Invalid input(s): PT Microbiology: Recent Results (from the past 240 hour(s))  Blood Culture (routine x 2)     Status: Abnormal   Collection Time: 09/26/16  6:35 PM  Result Value Ref Range Status   Specimen Description BLOOD RIGHT ANTECUBITAL  Final   Special Requests BOTTLES DRAWN AEROBIC AND ANAEROBIC 5CC  Final   Culture  Setup Time   Final    GRAM NEGATIVE RODS IN BOTH AEROBIC AND ANAEROBIC BOTTLES Organism ID to follow CRITICAL RESULT CALLED TO, READ BACK BY AND VERIFIED WITH:  G. Leim Fabry AT 1030 ON [id number] BY Rhea Bleacher    Culture ESCHERICHIA COLI (A)  Final   Report Status 09/29/2016 FINAL  Final   Organism ID, Bacteria ESCHERICHIA COLI  Final      Susceptibility   Escherichia coli - MIC*    AMPICILLIN >=32 RESISTANT Resistant     CEFAZOLIN <=4 SENSITIVE Sensitive     CEFEPIME <=1 SENSITIVE Sensitive     CEFTAZIDIME <=1 SENSITIVE Sensitive     CEFTRIAXONE <=1 SENSITIVE Sensitive     CIPROFLOXACIN <=0.25 SENSITIVE Sensitive     GENTAMICIN <=1 SENSITIVE Sensitive     IMIPENEM <=0.25 SENSITIVE Sensitive     TRIMETH/SULFA <=20 SENSITIVE Sensitive     AMPICILLIN/SULBACTAM 16 INTERMEDIATE Intermediate     PIP/TAZO <=4 SENSITIVE Sensitive     Extended ESBL NEGATIVE Sensitive     * ESCHERICHIA COLI  Urine culture     Status: Abnormal   Collection Time: 09/26/16  6:35 PM  Result Value Ref Range Status   Specimen Description URINE, RANDOM  Final   Special Requests NONE  Final   Culture  >=100,000 COLONIES/mL ESCHERICHIA COLI (A)  Final   Report Status 09/28/2016 FINAL  Final   Organism ID, Bacteria ESCHERICHIA COLI (A)  Final      Susceptibility   Escherichia coli - MIC*    AMPICILLIN >=32 RESISTANT Resistant     CEFAZOLIN <=4 SENSITIVE Sensitive     CEFTRIAXONE <=1 SENSITIVE Sensitive     CIPROFLOXACIN <=0.25 SENSITIVE Sensitive     GENTAMICIN <=1 SENSITIVE Sensitive     IMIPENEM <=0.25 SENSITIVE Sensitive     NITROFURANTOIN <=16 SENSITIVE Sensitive     TRIMETH/SULFA <=20 SENSITIVE Sensitive     AMPICILLIN/SULBACTAM 16 INTERMEDIATE Intermediate     PIP/TAZO <=4 SENSITIVE Sensitive     Extended ESBL NEGATIVE Sensitive     * >=100,000 COLONIES/mL ESCHERICHIA COLI  Blood Culture ID Panel (Reflexed)     Status: Abnormal   Collection Time: 09/26/16  6:35 PM  Result Value Ref Range Status   Enterococcus species NOT DETECTED NOT DETECTED Final   Listeria monocytogenes NOT DETECTED NOT DETECTED Final   Staphylococcus species NOT DETECTED NOT DETECTED Final   Staphylococcus aureus NOT DETECTED NOT DETECTED Final   Streptococcus species NOT DETECTED NOT DETECTED Final   Streptococcus agalactiae NOT DETECTED NOT DETECTED Final   Streptococcus pneumoniae NOT DETECTED NOT DETECTED Final   Streptococcus pyogenes NOT DETECTED NOT DETECTED Final   Acinetobacter baumannii NOT DETECTED NOT DETECTED Final   Enterobacteriaceae species DETECTED (A) NOT DETECTED Final    Comment: CRITICAL RESULT CALLED TO, READ BACK BY AND VERIFIED WITH: G. ABBOTT, PHARM AT [id number] ON [id number] BY S. YARBROUGH    Enterobacter cloacae complex NOT DETECTED NOT DETECTED Final   Escherichia coli DETECTED (A) NOT DETECTED Final    Comment: CRITICAL RESULT CALLED TO, READ BACK BY AND VERIFIED WITH: G. ABBOTT, PHARM AT [phone] ON [id number] BY S. YARBROUGH    Klebsiella oxytoca NOT DETECTED NOT DETECTED Final   Klebsiella pneumoniae NOT DETECTED NOT DETECTED Final   Proteus species NOT DETECTED NOT DETECTED Final    Serratia marcescens NOT DETECTED NOT DETECTED Final   Carbapenem resistance NOT DETECTED NOT DETECTED Final   Haemophilus influenzae NOT DETECTED NOT DETECTED Final   Neisseria meningitidis NOT DETECTED NOT DETECTED Final   Pseudomonas aeruginosa NOT DETECTED NOT DETECTED Final   Candida albicans NOT DETECTED NOT DETECTED Final   Candida glabrata NOT DETECTED NOT DETECTED  Final   Candida krusei NOT DETECTED NOT DETECTED Final   Candida parapsilosis NOT DETECTED NOT DETECTED Final   Candida tropicalis NOT DETECTED NOT DETECTED Final  Blood Culture (routine x 2)     Status: None   Collection Time: 09/26/16  6:50 PM  Result Value Ref Range Status   Specimen Description BLOOD LEFT ANTECUBITAL  Final   Special Requests BOTTLES DRAWN AEROBIC AND ANAEROBIC 5CC  Final   Culture NO GROWTH 5 DAYS  Final   Report Status 10/01/2016 FINAL  Final  Culture, blood (routine x 2)     Status: None   Collection Time: 09/28/16  5:15 AM  Result Value Ref Range Status   Specimen Description BLOOD RIGHT HAND  Final   Special Requests BOTTLES DRAWN AEROBIC ONLY [place]  Final   Culture NO GROWTH 5 DAYS  Final   Report Status 10/03/2016 FINAL  Final  Culture, blood (routine x 2)     Status: None   Collection Time: 09/28/16  5:20 AM  Result Value Ref Range Status   Specimen Description BLOOD LEFT HAND  Final   Special Requests IN PEDIATRIC BOTTLE [place]  Final   Culture NO GROWTH 5 DAYS  Final   Report Status 10/03/2016 FINAL  Final  Culture, blood (routine x 2)     Status: None   Collection Time: 09/28/16  3:35 PM  Result Value Ref Range Status   Specimen Description BLOOD RIGHT HAND  Final   Special Requests IN PEDIATRIC BOTTLE 2CC  Final   Culture NO GROWTH 5 DAYS  Final   Report Status 10/03/2016 FINAL  Final  Culture, blood (routine x 2)     Status: None   Collection Time: 09/28/16  3:40 PM  Result Value Ref Range Status   Specimen Description BLOOD LEFT HAND  Final   Special Requests IN PEDIATRIC  BOTTLE 2CC  Final   Culture NO GROWTH 5 DAYS  Final   Report Status 10/03/2016 FINAL  Final    FURTHER DISCHARGE INSTRUCTIONS:  Get Medicines reviewed and adjusted: Please take all your medications with you for your next visit with your Primary MD  Laboratory/radiological data: Please request your Primary MD to go over all hospital tests and procedure/radiological results at the follow up, please ask your Primary MD to get all Hospital records sent to his/her office.  In some cases, they will be blood work, cultures and biopsy results pending at the time of your discharge. Please request that your primary care M.D. goes through all the records of your hospital data and follows up on these results.  Also Note the following: If you experience worsening of your admission symptoms, develop shortness of breath, life threatening emergency, suicidal or homicidal thoughts you must seek medical attention immediately by calling 911 or calling your MD immediately  if symptoms less severe.  You must read complete instructions/literature along with all the possible adverse reactions/side effects for all the Medicines you take and that have been prescribed to you. Take any new Medicines after you have completely understood and accpet all the possible adverse reactions/side effects.   Do not drive when taking Pain medications or sleeping medications (Benzodaizepines)  Do not take more than prescribed Pain, Sleep and Anxiety Medications. It is not advisable to combine anxiety,sleep and pain medications without talking with your primary care practitioner  Special Instructions: If you have smoked or chewed Tobacco  in the last 2 yrs please stop smoking, stop any regular Alcohol  and  or any Recreational drug use.  Wear Seat belts while driving.  Please note: You were cared for by a hospitalist during your hospital stay. Once you are discharged, your primary care physician will handle any further medical  issues. Please note that NO REFILLS for any discharge medications will be authorized once you are discharged, as it is imperative that you return to your primary care physician (or establish a relationship with a primary care physician if you do not have one) for your post hospital discharge needs so that they can reassess your need for medications and monitor your lab values.  Total Time spent coordinating discharge including counseling, education and face to face time equals  45 minutes.  SignedOren Binet 10/05/2016 10:10 AM

## 2016-10-05 NOTE — Op Note (Signed)
INDICATIONS: infective endocarditis  PROCEDURE:   Informed consent was obtained prior to the procedure. The risks, benefits and alternatives for the procedure were discussed and the patient comprehended these risks.  Risks include, but are not limited to, cough, sore throat, vomiting, nausea, somnolence, esophageal and stomach trauma or perforation, bleeding, low blood pressure, aspiration, pneumonia, infection, trauma to the teeth and death.    After a procedural time-out, the oropharynx was anesthetized with 20% benzocaine spray.   During this procedure the patient was administered a total of Versed 2 mg and Fentanyl 25 mg to achieve and maintain moderate conscious sedation.  The patient's heart rate, blood pressure, and oxygen saturationweare monitored continuously during the procedure. The period of conscious sedation was 8 minutes, of which I was present face-to-face 100% of this time.  The transesophageal probe was inserted in the esophagus and stomach without difficulty and multiple views were obtained.  The patient was kept under observation until the patient left the procedure room.  The patient left the procedure room in stable condition.   Agitated microbubble saline contrast was not administered.  COMPLICATIONS:    There were no immediate complications.  FINDINGS:  Normal TEE.  RECOMMENDATIONS:    No echo evidence for endocarditis.  Time Spent Directly with the Patient:  30 minutes   Aleea Hendry 10/05/2016, 9:11 AM

## 2016-10-05 NOTE — Clinical Social Work Note (Signed)
Clinicals submitted this morning for insurance authorization for ST rehab through [employer]. Authorization pending. MD notified.  Jazzma Neidhardt Givens, MSW, LCSW Licensed Clinical Social Worker [place] [phone]

## 2016-10-05 NOTE — Care Management Note (Signed)
Case Management Note  Patient Details  Name: Charles Parker MRN: [id number] Date of Birth: 03-Jan-1946  Subjective/Objective:         CM following for progression and d/c planning.            Action/Plan: 10/05/2016 No CM needs, this pt for d/c to SNF.   Expected Discharge Date:    0103/2018              Expected Discharge Plan:  [place]  In-House Referral:  Clinical Social Work  Discharge planning Services  NA  Post Acute Care Choice:  NA Choice offered to:  NA  DME Arranged:  N/A DME Agency:  NA  HH Arranged:  NA HH Agency:  NA  Status of Service:  Completed, signed off  If discussed at [employer] of Stay Meetings, dates discussed:    Additional Comments:  Adron Bene, RN 10/05/2016, 3:11 PM

## 2016-10-05 NOTE — Op Note (Addendum)
Entered in error

## 2016-10-05 NOTE — Progress Notes (Signed)
Pt refuses to wear CPAP tonight. No distress noted. Pt encouraged to call RT if pt changes mind.  

## 2016-10-05 NOTE — Progress Notes (Signed)
Physical Therapy Treatment Patient Details Name: Charles Parker MRN: [id number] DOB: 1946/05/06 Today's Date: 10/05/2016    History of Present Illness  Charles Parker is a 71 y.o. male with medical history significant for insulin-dependent diabetes mellitus, history of DVT on Xarelto, hypertension, and BPH who presents to the emergency department with worsening pain in the left flank and lower abdomen, dysuria, and malaise. Patient reports that he was treated in the hospital for pyelonephritis approximately 5 weeks ago and made excellent improvement, but continued to have some residual pain in the left flank.    PT Comments    Progressing steadily with mobility and gait stability.  Still moves slowly without a lot of coordination and finesse.  Follow Up Recommendations  SNF     Equipment Recommendations  Rolling walker with 5" wheels    Recommendations for Other Services       Precautions / Restrictions Precautions Precautions: Fall    Mobility  Bed Mobility Overal bed mobility: Needs Assistance Bed Mobility: Supine to Sit     Supine to sit: Min guard     General bed mobility comments: extra time to get OOB, but no assist needed.  Transfers Overall transfer level: Needs assistance Equipment used: Rolling walker (2 wheeled) Transfers: Sit to/from Stand Sit to Stand: Min guard;Min assist         General transfer comment: cues for hand placement  Ambulation/Gait Ambulation/Gait assistance: Min guard Ambulation Distance (Feet): 180 Feet Assistive device: Rolling walker (2 wheeled) Gait Pattern/deviations: Step-through pattern     General Gait Details: gait generally steady with some scissoring the slower he ambulates and still fairly heavy use of the RW.  Cues for safer positioning in the RW   Stairs            Wheelchair Mobility    Modified Rankin (Stroke Patients Only)       Balance Overall balance assessment: Needs assistance   Sitting  balance-Leahy Scale: Good     Standing balance support: Bilateral upper extremity supported;No upper extremity supported Standing balance-Leahy Scale: Fair Standing balance comment: can maintain stance statically, but needs AD or assist for mobility                    Cognition Arousal/Alertness: Awake/alert Behavior During Therapy: WFL for tasks assessed/performed Overall Cognitive Status: Within Functional Limits for tasks assessed                      Exercises      General Comments        Pertinent Vitals/Pain Pain Assessment: Faces Faces Pain Scale: Hurts little more Pain Location: stomach Pain Descriptors / Indicators: Sore;Sharp Pain Intervention(s): Monitored during session    Home Living                      Prior Function            PT Goals (current goals can now be found in the care plan section) Acute Rehab PT Goals Patient Stated Goal: back home PT Goal Formulation: With patient Time For Goal Achievement: 10/13/16 Potential to Achieve Goals: Good    Frequency    Min 3X/week      PT Plan Current plan remains appropriate    Co-evaluation             End of Session   Activity Tolerance: Patient tolerated treatment well Patient left: in chair;with call bell/phone within reach;with chair  alarm set;with family/visitor present     Time: [id number] PT Time Calculation (min) (ACUTE ONLY): 20 min  Charges:  $Gait Training: 8-22 mins                    G CodesTessie Fass Kaamil [place] 10/05/2016, 12:19 PM 10/05/2016  Donnella Sham, PT [phone]  (pager)

## 2016-10-06 ENCOUNTER — Ambulatory Visit: Payer: Commercial Managed Care - HMO | Admitting: Endocrinology

## 2016-10-06 ENCOUNTER — Encounter (HOSPITAL_COMMUNITY): Payer: Self-pay | Admitting: Cardiovascular Disease

## 2016-10-06 DIAGNOSIS — H409 Unspecified glaucoma: Secondary | ICD-10-CM | POA: Diagnosis not present

## 2016-10-06 DIAGNOSIS — Y999 Unspecified external cause status: Secondary | ICD-10-CM | POA: Diagnosis not present

## 2016-10-06 DIAGNOSIS — Z86718 Personal history of other venous thrombosis and embolism: Secondary | ICD-10-CM | POA: Diagnosis not present

## 2016-10-06 DIAGNOSIS — R42 Dizziness and giddiness: Secondary | ICD-10-CM | POA: Diagnosis not present

## 2016-10-06 DIAGNOSIS — R5381 Other malaise: Secondary | ICD-10-CM | POA: Diagnosis not present

## 2016-10-06 DIAGNOSIS — M4644 Discitis, unspecified, thoracic region: Secondary | ICD-10-CM | POA: Diagnosis not present

## 2016-10-06 DIAGNOSIS — N390 Urinary tract infection, site not specified: Secondary | ICD-10-CM | POA: Diagnosis not present

## 2016-10-06 DIAGNOSIS — I824Y9 Acute embolism and thrombosis of unspecified deep veins of unspecified proximal lower extremity: Secondary | ICD-10-CM | POA: Diagnosis not present

## 2016-10-06 DIAGNOSIS — M5489 Other dorsalgia: Secondary | ICD-10-CM | POA: Diagnosis not present

## 2016-10-06 DIAGNOSIS — G061 Intraspinal abscess and granuloma: Secondary | ICD-10-CM | POA: Diagnosis not present

## 2016-10-06 DIAGNOSIS — R079 Chest pain, unspecified: Secondary | ICD-10-CM | POA: Diagnosis present

## 2016-10-06 DIAGNOSIS — G9520 Unspecified cord compression: Secondary | ICD-10-CM | POA: Diagnosis not present

## 2016-10-06 DIAGNOSIS — G4730 Sleep apnea, unspecified: Secondary | ICD-10-CM | POA: Diagnosis present

## 2016-10-06 DIAGNOSIS — R0789 Other chest pain: Secondary | ICD-10-CM | POA: Diagnosis not present

## 2016-10-06 DIAGNOSIS — R938 Abnormal findings on diagnostic imaging of other specified body structures: Secondary | ICD-10-CM | POA: Diagnosis not present

## 2016-10-06 DIAGNOSIS — R3916 Straining to void: Secondary | ICD-10-CM | POA: Diagnosis not present

## 2016-10-06 DIAGNOSIS — Z794 Long term (current) use of insulin: Secondary | ICD-10-CM | POA: Diagnosis not present

## 2016-10-06 DIAGNOSIS — R2689 Other abnormalities of gait and mobility: Secondary | ICD-10-CM | POA: Diagnosis not present

## 2016-10-06 DIAGNOSIS — R531 Weakness: Secondary | ICD-10-CM | POA: Diagnosis not present

## 2016-10-06 DIAGNOSIS — R6520 Severe sepsis without septic shock: Secondary | ICD-10-CM | POA: Diagnosis not present

## 2016-10-06 DIAGNOSIS — S299XXA Unspecified injury of thorax, initial encounter: Secondary | ICD-10-CM | POA: Diagnosis not present

## 2016-10-06 DIAGNOSIS — S199XXA Unspecified injury of neck, initial encounter: Secondary | ICD-10-CM | POA: Diagnosis not present

## 2016-10-06 DIAGNOSIS — R404 Transient alteration of awareness: Secondary | ICD-10-CM | POA: Diagnosis not present

## 2016-10-06 DIAGNOSIS — S0990XA Unspecified injury of head, initial encounter: Secondary | ICD-10-CM | POA: Diagnosis not present

## 2016-10-06 DIAGNOSIS — I951 Orthostatic hypotension: Secondary | ICD-10-CM | POA: Diagnosis not present

## 2016-10-06 DIAGNOSIS — R7881 Bacteremia: Secondary | ICD-10-CM | POA: Diagnosis not present

## 2016-10-06 DIAGNOSIS — W1800XA Striking against unspecified object with subsequent fall, initial encounter: Secondary | ICD-10-CM | POA: Diagnosis not present

## 2016-10-06 DIAGNOSIS — Y939 Activity, unspecified: Secondary | ICD-10-CM | POA: Diagnosis not present

## 2016-10-06 DIAGNOSIS — R109 Unspecified abdominal pain: Secondary | ICD-10-CM | POA: Diagnosis not present

## 2016-10-06 DIAGNOSIS — M545 Low back pain: Secondary | ICD-10-CM | POA: Diagnosis not present

## 2016-10-06 DIAGNOSIS — G8220 Paraplegia, unspecified: Secondary | ICD-10-CM | POA: Diagnosis not present

## 2016-10-06 DIAGNOSIS — Z79899 Other long term (current) drug therapy: Secondary | ICD-10-CM | POA: Diagnosis not present

## 2016-10-06 DIAGNOSIS — M4646 Discitis, unspecified, lumbar region: Secondary | ICD-10-CM | POA: Diagnosis not present

## 2016-10-06 DIAGNOSIS — M4624 Osteomyelitis of vertebra, thoracic region: Secondary | ICD-10-CM | POA: Diagnosis not present

## 2016-10-06 DIAGNOSIS — E785 Hyperlipidemia, unspecified: Secondary | ICD-10-CM | POA: Diagnosis not present

## 2016-10-06 DIAGNOSIS — S3992XA Unspecified injury of lower back, initial encounter: Secondary | ICD-10-CM | POA: Diagnosis not present

## 2016-10-06 DIAGNOSIS — M546 Pain in thoracic spine: Secondary | ICD-10-CM | POA: Diagnosis not present

## 2016-10-06 DIAGNOSIS — Z95828 Presence of other vascular implants and grafts: Secondary | ICD-10-CM | POA: Diagnosis not present

## 2016-10-06 DIAGNOSIS — Z7901 Long term (current) use of anticoagulants: Secondary | ICD-10-CM | POA: Diagnosis not present

## 2016-10-06 DIAGNOSIS — E7800 Pure hypercholesterolemia, unspecified: Secondary | ICD-10-CM | POA: Diagnosis not present

## 2016-10-06 DIAGNOSIS — N401 Enlarged prostate with lower urinary tract symptoms: Secondary | ICD-10-CM | POA: Diagnosis not present

## 2016-10-06 DIAGNOSIS — K5900 Constipation, unspecified: Secondary | ICD-10-CM | POA: Diagnosis not present

## 2016-10-06 DIAGNOSIS — A4151 Sepsis due to Escherichia coli [E. coli]: Secondary | ICD-10-CM | POA: Diagnosis not present

## 2016-10-06 DIAGNOSIS — I10 Essential (primary) hypertension: Secondary | ICD-10-CM | POA: Diagnosis not present

## 2016-10-06 DIAGNOSIS — M542 Cervicalgia: Secondary | ICD-10-CM | POA: Diagnosis not present

## 2016-10-06 DIAGNOSIS — G060 Intracranial abscess and granuloma: Secondary | ICD-10-CM | POA: Diagnosis not present

## 2016-10-06 DIAGNOSIS — M898X8 Other specified disorders of bone, other site: Secondary | ICD-10-CM | POA: Diagnosis not present

## 2016-10-06 DIAGNOSIS — Z833 Family history of diabetes mellitus: Secondary | ICD-10-CM | POA: Diagnosis not present

## 2016-10-06 DIAGNOSIS — Y929 Unspecified place or not applicable: Secondary | ICD-10-CM | POA: Diagnosis not present

## 2016-10-06 DIAGNOSIS — B9689 Other specified bacterial agents as the cause of diseases classified elsewhere: Secondary | ICD-10-CM | POA: Diagnosis not present

## 2016-10-06 DIAGNOSIS — R10817 Generalized abdominal tenderness: Secondary | ICD-10-CM | POA: Diagnosis not present

## 2016-10-06 DIAGNOSIS — G062 Extradural and subdural abscess, unspecified: Secondary | ICD-10-CM | POA: Diagnosis not present

## 2016-10-06 DIAGNOSIS — Z885 Allergy status to narcotic agent status: Secondary | ICD-10-CM | POA: Diagnosis not present

## 2016-10-06 DIAGNOSIS — M4627 Osteomyelitis of vertebra, lumbosacral region: Secondary | ICD-10-CM | POA: Diagnosis not present

## 2016-10-06 DIAGNOSIS — I252 Old myocardial infarction: Secondary | ICD-10-CM | POA: Diagnosis not present

## 2016-10-06 DIAGNOSIS — G4733 Obstructive sleep apnea (adult) (pediatric): Secondary | ICD-10-CM | POA: Diagnosis not present

## 2016-10-06 DIAGNOSIS — M6281 Muscle weakness (generalized): Secondary | ICD-10-CM | POA: Diagnosis not present

## 2016-10-06 DIAGNOSIS — E119 Type 2 diabetes mellitus without complications: Secondary | ICD-10-CM | POA: Diagnosis not present

## 2016-10-06 DIAGNOSIS — Z9181 History of falling: Secondary | ICD-10-CM | POA: Diagnosis not present

## 2016-10-06 DIAGNOSIS — A419 Sepsis, unspecified organism: Secondary | ICD-10-CM | POA: Diagnosis not present

## 2016-10-06 DIAGNOSIS — Z9889 Other specified postprocedural states: Secondary | ICD-10-CM | POA: Diagnosis not present

## 2016-10-06 LAB — GLUCOSE, CAPILLARY
GLUCOSE-CAPILLARY: 110 mg/dL — AB (ref 65–99)
Glucose-Capillary: 116 mg/dL — ABNORMAL HIGH (ref 65–99)
Glucose-Capillary: 141 mg/dL — ABNORMAL HIGH (ref 65–99)

## 2016-10-06 MED ORDER — HEPARIN SOD (PORK) LOCK FLUSH 100 UNIT/ML IV SOLN
250.0000 [IU] | INTRAVENOUS | Status: AC | PRN
  Administered 2016-10-06: 250 [IU]

## 2016-10-06 NOTE — Progress Notes (Signed)
Report has been called to [employer], Therapist, sports at [facility] [employer]

## 2016-10-06 NOTE — Progress Notes (Signed)
Seen and examined-continues to have some back pain but easily controlled. Vital signs are stable  Abdomen-soft nontender Lungs-clear to auscultation Neuro-nonfocal  Plan-remains stable to discharge to SNF. See discharge summary done on 1/2 for further details.

## 2016-10-06 NOTE — Clinical Social Work Placement (Signed)
   CLINICAL SOCIAL WORK PLACEMENT  NOTE 10/06/16 - DISCHARGED TO [facility]IA AMBULANCE  Date:  10/06/2016  Patient Details  Name: Charles Parker MRN: [id number] Date of Birth: June 23, 1946  Clinical Social Work is seeking post-discharge placement for this patient at the [place] level of care (*CSW will initial, date and re-position this form in  chart as items are completed):  Yes   Patient/family provided with [place] Work Department's list of facilities offering this level of care within the geographic area requested by the patient (or if unable, by the patient's family).  Yes   Patient/family informed of their freedom to choose among providers that offer the needed level of care, that participate in Medicare, Medicaid or managed care program needed by the patient, have an available bed and are willing to accept the patient.  Yes   Patient/family informed of [place]'s ownership interest in [facility] and [facility], as well as of the fact that they are under no obligation to receive care at these facilities.  PASRR submitted to EDS on 10/04/16     PASRR number received on 10/04/16     Existing PASRR number confirmed on       FL2 transmitted to all facilities in geographic area requested by pt/family on 10/04/16     FL2 transmitted to all facilities within larger geographic area on       Patient informed that his/her managed care company has contracts with or will negotiate with certain facilities, including the following:        Yes   Patient/family informed of bed offers received.  Patient chooses bed at [facility]     Physician recommends and patient chooses bed at      Patient to be transferred to [facility] on 10/06/16.  Patient to be transferred to facility by Ambulance     Patient family notified on 10/06/16 of transfer.  Name of family member notified:  Charles Parker - brother. [phone] (mobile)      PHYSICIAN      Additional Comment:  10/06/16 - Received authorization from [employer]: [place] [phone] (Ref. #). Authorization for 3 days, next review 1/5, RUG Level - RVA. 568 minutes of PT authorized.   _______________________________________________ Sable Feil, LCSW 10/06/2016, 5:06 PM

## 2016-10-07 ENCOUNTER — Emergency Department (HOSPITAL_COMMUNITY)
Admission: EM | Admit: 2016-10-07 | Discharge: 2016-10-07 | Disposition: A | Discharge status: Home / Self Care | Payer: Medicare HMO | Attending: Emergency Medicine | Admitting: Emergency Medicine

## 2016-10-07 ENCOUNTER — Non-Acute Institutional Stay (SKILLED_NURSING_FACILITY): Payer: Medicare HMO | Admitting: Internal Medicine

## 2016-10-07 ENCOUNTER — Ambulatory Visit: Payer: Self-pay

## 2016-10-07 ENCOUNTER — Emergency Department (HOSPITAL_COMMUNITY): Payer: Medicare HMO

## 2016-10-07 ENCOUNTER — Encounter: Payer: Self-pay | Admitting: Internal Medicine

## 2016-10-07 ENCOUNTER — Encounter (HOSPITAL_COMMUNITY): Payer: Self-pay | Admitting: Emergency Medicine

## 2016-10-07 ENCOUNTER — Telehealth: Payer: Self-pay | Admitting: *Deleted

## 2016-10-07 DIAGNOSIS — I252 Old myocardial infarction: Secondary | ICD-10-CM | POA: Diagnosis not present

## 2016-10-07 DIAGNOSIS — R531 Weakness: Secondary | ICD-10-CM | POA: Diagnosis not present

## 2016-10-07 DIAGNOSIS — I824Y9 Acute embolism and thrombosis of unspecified deep veins of unspecified proximal lower extremity: Secondary | ICD-10-CM

## 2016-10-07 DIAGNOSIS — E119 Type 2 diabetes mellitus without complications: Secondary | ICD-10-CM

## 2016-10-07 DIAGNOSIS — S199XXA Unspecified injury of neck, initial encounter: Secondary | ICD-10-CM | POA: Diagnosis not present

## 2016-10-07 DIAGNOSIS — M545 Low back pain: Secondary | ICD-10-CM | POA: Insufficient documentation

## 2016-10-07 DIAGNOSIS — R10817 Generalized abdominal tenderness: Secondary | ICD-10-CM | POA: Diagnosis not present

## 2016-10-07 DIAGNOSIS — R3916 Straining to void: Secondary | ICD-10-CM

## 2016-10-07 DIAGNOSIS — W19XXXA Unspecified fall, initial encounter: Secondary | ICD-10-CM

## 2016-10-07 DIAGNOSIS — N401 Enlarged prostate with lower urinary tract symptoms: Secondary | ICD-10-CM | POA: Diagnosis not present

## 2016-10-07 DIAGNOSIS — R42 Dizziness and giddiness: Secondary | ICD-10-CM

## 2016-10-07 DIAGNOSIS — R0789 Other chest pain: Secondary | ICD-10-CM | POA: Diagnosis not present

## 2016-10-07 DIAGNOSIS — I10 Essential (primary) hypertension: Secondary | ICD-10-CM | POA: Diagnosis not present

## 2016-10-07 DIAGNOSIS — Y929 Unspecified place or not applicable: Secondary | ICD-10-CM | POA: Insufficient documentation

## 2016-10-07 DIAGNOSIS — K5900 Constipation, unspecified: Secondary | ICD-10-CM

## 2016-10-07 DIAGNOSIS — M542 Cervicalgia: Secondary | ICD-10-CM | POA: Diagnosis not present

## 2016-10-07 DIAGNOSIS — R5381 Other malaise: Secondary | ICD-10-CM

## 2016-10-07 DIAGNOSIS — Y92129 Unspecified place in nursing home as the place of occurrence of the external cause: Secondary | ICD-10-CM

## 2016-10-07 DIAGNOSIS — N10 Acute tubulo-interstitial nephritis: Secondary | ICD-10-CM

## 2016-10-07 DIAGNOSIS — Y999 Unspecified external cause status: Secondary | ICD-10-CM | POA: Insufficient documentation

## 2016-10-07 DIAGNOSIS — M6281 Muscle weakness (generalized): Secondary | ICD-10-CM | POA: Diagnosis not present

## 2016-10-07 DIAGNOSIS — I951 Orthostatic hypotension: Secondary | ICD-10-CM | POA: Diagnosis not present

## 2016-10-07 DIAGNOSIS — S3992XA Unspecified injury of lower back, initial encounter: Secondary | ICD-10-CM | POA: Diagnosis not present

## 2016-10-07 DIAGNOSIS — M546 Pain in thoracic spine: Secondary | ICD-10-CM | POA: Diagnosis not present

## 2016-10-07 DIAGNOSIS — B9620 Unspecified Escherichia coli [E. coli] as the cause of diseases classified elsewhere: Secondary | ICD-10-CM

## 2016-10-07 DIAGNOSIS — Z7901 Long term (current) use of anticoagulants: Secondary | ICD-10-CM | POA: Insufficient documentation

## 2016-10-07 DIAGNOSIS — G062 Extradural and subdural abscess, unspecified: Secondary | ICD-10-CM

## 2016-10-07 DIAGNOSIS — S299XXA Unspecified injury of thorax, initial encounter: Secondary | ICD-10-CM | POA: Diagnosis not present

## 2016-10-07 DIAGNOSIS — R7881 Bacteremia: Secondary | ICD-10-CM | POA: Diagnosis not present

## 2016-10-07 DIAGNOSIS — Y939 Activity, unspecified: Secondary | ICD-10-CM | POA: Insufficient documentation

## 2016-10-07 DIAGNOSIS — Z794 Long term (current) use of insulin: Secondary | ICD-10-CM

## 2016-10-07 DIAGNOSIS — S0990XA Unspecified injury of head, initial encounter: Secondary | ICD-10-CM | POA: Diagnosis not present

## 2016-10-07 DIAGNOSIS — W1800XA Striking against unspecified object with subsequent fall, initial encounter: Secondary | ICD-10-CM | POA: Insufficient documentation

## 2016-10-07 LAB — CBC WITH DIFFERENTIAL/PLATELET
Basophils Absolute: 0 10*3/uL (ref 0.0–0.1)
Basophils Relative: 0 %
Eosinophils Absolute: 0 10*3/uL (ref 0.0–0.7)
Eosinophils Relative: 0 %
HEMATOCRIT: 33.7 % — AB (ref 39.0–52.0)
HEMOGLOBIN: 10.5 g/dL — AB (ref 13.0–17.0)
LYMPHS ABS: 1.6 10*3/uL (ref 0.7–4.0)
Lymphocytes Relative: 23 %
MCH: 27.9 pg (ref 26.0–34.0)
MCHC: 31.2 g/dL (ref 30.0–36.0)
MCV: 89.6 fL (ref 78.0–100.0)
Monocytes Absolute: 0.4 10*3/uL (ref 0.1–1.0)
Monocytes Relative: 6 %
NEUTROS ABS: 4.9 10*3/uL (ref 1.7–7.7)
NEUTROS PCT: 71 %
Platelets: 327 10*3/uL (ref 150–400)
RBC: 3.76 MIL/uL — ABNORMAL LOW (ref 4.22–5.81)
RDW: 14.1 % (ref 11.5–15.5)
WBC: 7 10*3/uL (ref 4.0–10.5)

## 2016-10-07 LAB — URINALYSIS, ROUTINE W REFLEX MICROSCOPIC
Bilirubin Urine: NEGATIVE
GLUCOSE, UA: NEGATIVE mg/dL
HGB URINE DIPSTICK: NEGATIVE
Ketones, ur: NEGATIVE mg/dL
Leukocytes, UA: NEGATIVE
Nitrite: NEGATIVE
Protein, ur: NEGATIVE mg/dL
SPECIFIC GRAVITY, URINE: 1.017 (ref 1.005–1.030)
pH: 7 (ref 5.0–8.0)

## 2016-10-07 LAB — BASIC METABOLIC PANEL
Anion gap: 12 (ref 5–15)
BUN: 16 mg/dL (ref 6–20)
CHLORIDE: 104 mmol/L (ref 101–111)
CO2: 22 mmol/L (ref 22–32)
CREATININE: 0.81 mg/dL (ref 0.61–1.24)
Calcium: 9.3 mg/dL (ref 8.9–10.3)
GFR calc non Af Amer: >60 mL/min (ref >60)
Glucose, Bld: 80 mg/dL (ref 65–99)
Potassium: 3.9 mmol/L (ref 3.5–5.1)
Sodium: 138 mmol/L (ref 135–145)

## 2016-10-07 LAB — TROPONIN I: Troponin I: <0.03 ng/mL (ref <0.03)

## 2016-10-07 MED ORDER — ACETAMINOPHEN 325 MG PO TABS
650.0000 mg | ORAL_TABLET | Freq: Once | ORAL | Status: AC
  Administered 2016-10-07: 650 mg via ORAL
  Filled 2016-10-07: qty 2

## 2016-10-07 MED ORDER — SODIUM CHLORIDE 0.9 % IV BOLUS (SEPSIS)
1000.0000 mL | Freq: Once | INTRAVENOUS | Status: AC
  Administered 2016-10-07: 1000 mL via INTRAVENOUS

## 2016-10-07 NOTE — ED Notes (Signed)
Patient taken to xray.

## 2016-10-07 NOTE — Discharge Instructions (Signed)
Your fall is likely due to physical decondition from recent hospitalization as well as dehydration.  Stay hydrated, take your time getting up and out of bed to decrease risk of falling or passing out.  Follow up with your doctor for further management of your condition.  Return to the ER if you have any concerns.

## 2016-10-07 NOTE — ED Provider Notes (Signed)
[facility] DEPT Provider Note   CSN: [id number] Arrival date & time: 10/07/16  0705     History   Chief Complaint No chief complaint on file.   HPI Charles Parker is a 71 y.o. male.  HPI   71 year old male with history of insulin-dependent diabetes, clotting disorder currently on Xarelto, CAD, hypertension, sleep apnea presents for evaluation of a fall. Patient was recently admitted to the hospital for urosepsis and was hospitalized for approximately a day, discharged to rehabilitation facility yesterday. This morning he got up to use the bathroom, as he stood up to use the urinal, he became lightheadedness and dizzy, fell forward and had an apparent syncopal episode. He complains of pain to the mid back and low back which was present before the fall. He believes may have struck his face against the wall but denies any significant facial pain or neck pain. Does complain of chest and abdominal pain but states that this pain is chronic. Describe pain as an achy sensation. States pain is most significant to his lower back and rated as 8 out of 10, sharp. Denies any pain to his extremities, denies numbness or weakness or confusion.  Past Medical History:  Diagnosis Date  . Clotting disorder (HCC)    DVT  . Diabetes mellitus    type II  . Diverticulosis   . DVT (deep venous thrombosis) ([place])   . Glaucoma    no eye drops   . Hepatic cyst   . Hypercholesterolemia   . Hypertension   . Myocardial infarction    very mild long ago   . Renal cyst   . Sleep apnea   . Tubulovillous adenoma     Patient Active Problem List   Diagnosis Date Noted  . Staphylococcus aureus bacteremia   . Elevated lactic acid level   . Sepsis due to urinary tract infection ([place]) 09/26/2016  . Diabetes mellitus, type II, insulin dependent ([place]) 09/26/2016  . Sepsis secondary to UTI ([place]) 09/26/2016  . Palpitations 09/08/2016  . E coli bacteremia 08/19/2016  . Acute pyelonephritis 08/17/2016  .  Rotator cuff tendinitis 12/12/2015  . Diabetes mellitus with ophthalmic complication ([place]) 35/67/0141  . Tremor 11/11/2015  . Overgrown toenails 11/11/2015  . Primary osteoarthritis of both knees 07/14/2015  . Acute prostatitis 11/01/2014  . DVT, lower extremity, proximal ([place]) 07/10/2014  . Routine general medical examination at a health care facility 06/21/2013  . Hyperlipidemia with target LDL less than 70 06/20/2013  . Benign prostatic hyperplasia (BPH) with straining on urination 06/20/2013  . Brain atrophy 06/20/2013  . OSA (obstructive sleep apnea) 06/20/2013  . Depression with anxiety 06/20/2013  . 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 12/27/2011  . Hypertension     Past Surgical History:  Procedure Laterality Date  . BLADDER NECK RECONSTRUCTION  02/03/2012   Procedure: BLADDER NECK REPAIR;  Surgeon: Adin Hector, MD;  Location: [facility] ORS;  Service: General;;  . COLONOSCOPY  02/2013   polyp removal(mult.)  . KNEE SURGERY  2004   left  . POLYPECTOMY    . PROCTOSCOPY  02/03/2012   Procedure: PROCTOSCOPY;  Surgeon: Adin Hector, MD;  Location: [facility] OR[facility];  Service: General;;  . STOMACH SURGERY  02/2012  . TEE WITHOUT CARDIOVERSION N/A 10/05/2016   Procedure: TRANSESOPHAGEAL ECHOCARDIOGRAM (TEE);  Surgeon: Sanda Klein, MD;  Location: [facility] ENDOSCOPY;  Service: Cardiovascular;  Laterality: N/A;       Home Medications  Prior to Admission medications   Medication Sig Start Date End Date Taking? Authorizing Provider  cefTRIAXone (ROCEPHIN) IVPB Inject 2 g into the vein every 12 (twelve) hours. Indication:  Ecoli bacteremia with small epidural abscess/myositis Last Day of Therapy:  11/07/16 Labs - Once weekly:  CBC/D and BMP, Labs - Every other week:  ESR and CRP 10/05/16 11/08/16  Shanker Kristeen Mans, MD  HYDROcodone-acetaminophen (NORCO/VICODIN) 5-325 MG tablet Take 1 tablet by mouth every 6 (six) hours as needed for  moderate pain. 10/05/16   Shanker Kristeen Mans, MD  Insulin Glargine (LANTUS SOLOSTAR) 100 UNIT/ML Solostar Pen Inject 25 units every night at bedtime 10/05/16   Jonetta Osgood, MD  Insulin Pen Needle 31G X 5 MM MISC Administer lantus once nightly 08/20/16   Patrecia Pour, MD  methocarbamol (ROBAXIN) 500 MG tablet Take 1 tablet (500 mg total) by mouth every 8 (eight) hours as needed for muscle spasms. 10/05/16   Shanker Kristeen Mans, MD  Multiple Vitamin (MULTIVITAMIN WITH MINERALS) TABS tablet Take 1 tablet by mouth daily.    Historical Provider, MD  polyethylene glycol (MIRALAX / GLYCOLAX) packet Take 17 g by mouth 2 (two) times daily. 10/05/16   Shanker Kristeen Mans, MD  rivaroxaban (XARELTO) 20 MG TABS tablet Take 1 tablet (20 mg total) by mouth daily with supper. Patient taking differently: Take 20 mg by mouth daily with breakfast.  07/30/16   Janith Lima, MD  simvastatin (ZOCOR) 40 MG tablet TAKE 1 TABLET(40 MG) BY MOUTH EVERY EVENING Patient taking differently: TAKE 1 TABLET(40 MG) BY MOUTH EVERY MORNING 09/13/16   Janith Lima, MD  tamsulosin (FLOMAX) 0.4 MG CAPS capsule Take 0.4 mg by mouth daily after breakfast.     Historical Provider, MD    Family History Family History  Problem Relation Age of Onset  . Hypertension Mother   . Diabetes Mother   . Cancer Mother     ? location  . Colon cancer Father 48  . Colon polyps Neg Hx     Social History Social History  Substance Use Topics  . Smoking status: Never Smoker  . Smokeless tobacco: Never Used  . Alcohol use 0.0 oz/week     Comment: once every two months     Allergies   Metformin and related and Codeine   Review of Systems Review of Systems  All other systems reviewed and are negative.    Physical Exam Updated Vital Signs BP 128/77 (BP Location: Left Arm)   Pulse 87   Temp 98.1 F (36.7 C) (Oral)   Resp 16   SpO2 100%   Physical Exam  Constitutional: He is oriented to person, place, and time. He appears  well-developed and well-nourished. No distress.  HENT:  Head: Normocephalic and atraumatic.  No hemotympanum, no septal hematoma, no malocclusion, no midface tenderness, no scalp tenderness  Eyes: Conjunctivae are normal.  Neck: Normal range of motion. Neck supple.  Cervical collar in place. Patient has no significant midline cervical spine tenderness crepitus or step-off.  Cardiovascular: Normal rate and regular rhythm.   Pulmonary/Chest: Effort normal and breath sounds normal. He has no wheezes. He exhibits tenderness (Mild anterior chest wall tenderness without crepitus or emphysema, no bruising noted).  Abdominal: Soft. There is tenderness (Mild diffuse abdominal tenderness without guarding or rebound tenderness).  Musculoskeletal: He exhibits tenderness (Tenderness along thoracic and lumbar spine on palpation without crepitus or step-off.).  No tenderness to all 4 extremities  Neurological: He is alert and  oriented to person, place, and time.  Skin: No rash noted.  Psychiatric: He has a normal mood and affect.  Nursing note and vitals reviewed.    ED Treatments / Results  Labs (all labs ordered are listed, but only abnormal results are displayed) Labs Reviewed  CBC WITH DIFFERENTIAL/PLATELET - Abnormal; Notable for the following:       Result Value   RBC 3.76 (*)    Hemoglobin 10.5 (*)    HCT 33.7 (*)    All other components within normal limits  URINALYSIS, ROUTINE W REFLEX MICROSCOPIC - Abnormal; Notable for the following:    APPearance CLOUDY (*)    All other components within normal limits  BASIC METABOLIC PANEL  TROPONIN I    EKG  EKG Interpretation None      Date: 10/07/2016  Rate: 91  Rhythm: normal sinus rhythm  QRS Axis: normal  Intervals: normal  ST/T Wave abnormalities: normal  Conduction Disutrbances: none  Narrative Interpretation:   Old EKG Reviewed: No significant changes noted    Radiology Dg Thoracic Spine 2 View  Result Date:  10/07/2016 CLINICAL DATA:  [place] this morning, having mid/lower back pain, upper back/neck pain EXAM: THORACIC SPINE 2 VIEWS COMPARISON:  Chest radiographs 09/26/2016 FINDINGS: Twelve pairs of ribs. Bones demineralized. Compression fracture mid thoracic spine with 50% height loss of T8 vertebral body, unchanged. Remaining vertebral body heights maintained. Endplate spur formation lower thoracic spine. No definite acute fracture, subluxation, or bone destruction. IMPRESSION: Chronic compression fracture T8 vertebral body. No acute abnormalities. Electronically Signed   By: Lavonia Dana M.D.   On: 10/07/2016 08:09   Dg Lumbar Spine Complete  Result Date: 10/07/2016 CLINICAL DATA:  [place] this morning, having mid/lower back pain, upper back/neck pain EXAM: LUMBAR SPINE - COMPLETE 4+ VIEW COMPARISON:  MRI lumbar spine 09/30/2016 FINDINGS: Diffuse osseous demineralization. Five non-rib-bearing lumbar vertebra. Scattered mild degenerative disc disease changes with small endplate spurs at several levels. Vertebral body heights maintained without fracture or subluxation. No bone destruction or spondylolysis. SI joints preserved. IMPRESSION: Mild degenerative disc disease changes lumbar spine. No acute abnormalities. Electronically Signed   By: Lavonia Dana M.D.   On: 10/07/2016 08:07   Ct Head Wo Contrast  Result Date: 10/07/2016 CLINICAL DATA:  Recent fall with head injury and neck pain, initial encounter EXAM: CT HEAD WITHOUT CONTRAST CT CERVICAL SPINE WITHOUT CONTRAST TECHNIQUE: Multidetector CT imaging of the head and cervical spine was performed following the standard protocol without intravenous contrast. Multiplanar CT image reconstructions of the cervical spine were also generated. COMPARISON:  None. FINDINGS: CT HEAD FINDINGS Brain: Mild atrophic changes are noted. A cavum septum pellucidum is seen. No findings to suggest acute hemorrhage, acute infarction or space-occupying mass lesion are noted. Vascular: No  hyperdense vessel or unexpected calcification. Skull: Normal. Negative for fracture or focal lesion. Sinuses/Orbits: No acute finding. Other: None. CT CERVICAL SPINE FINDINGS Alignment: Vertebral body alignment is within normal limits. Skull base and vertebrae: 7 cervical segments are well visualized. Osteophytic changes are noted at all levels. Mild facet hypertrophic changes are seen as well. No fracture or acute facet abnormality is noted. Soft tissues and spinal canal: Within normal limits. Disc levels: Disc space narrowing is noted worst at C3-4 and C5-6. As described above multilevel osteophytic changes are noted. Upper chest: Within normal limits.  Right-sided PICC line is seen. Other: None IMPRESSION: CT of the head: Atrophic changes are noted without acute intracranial abnormality. CT of cervical spine: Degenerative change without  acute abnormality. Electronically Signed   By: Inez Catalina M.D.   On: 10/07/2016 08:36   Ct Cervical Spine Wo Contrast  Result Date: 10/07/2016 CLINICAL DATA:  Recent fall with head injury and neck pain, initial encounter EXAM: CT HEAD WITHOUT CONTRAST CT CERVICAL SPINE WITHOUT CONTRAST TECHNIQUE: Multidetector CT imaging of the head and cervical spine was performed following the standard protocol without intravenous contrast. Multiplanar CT image reconstructions of the cervical spine were also generated. COMPARISON:  None. FINDINGS: CT HEAD FINDINGS Brain: Mild atrophic changes are noted. A cavum septum pellucidum is seen. No findings to suggest acute hemorrhage, acute infarction or space-occupying mass lesion are noted. Vascular: No hyperdense vessel or unexpected calcification. Skull: Normal. Negative for fracture or focal lesion. Sinuses/Orbits: No acute finding. Other: None. CT CERVICAL SPINE FINDINGS Alignment: Vertebral body alignment is within normal limits. Skull base and vertebrae: 7 cervical segments are well visualized. Osteophytic changes are noted at all levels.  Mild facet hypertrophic changes are seen as well. No fracture or acute facet abnormality is noted. Soft tissues and spinal canal: Within normal limits. Disc levels: Disc space narrowing is noted worst at C3-4 and C5-6. As described above multilevel osteophytic changes are noted. Upper chest: Within normal limits.  Right-sided PICC line is seen. Other: None IMPRESSION: CT of the head: Atrophic changes are noted without acute intracranial abnormality. CT of cervical spine: Degenerative change without acute abnormality. Electronically Signed   By: Inez Catalina M.D.   On: 10/07/2016 08:36    Procedures Procedures (including critical care time)  Medications Ordered in ED Medications  sodium chloride 0.9 % bolus 1,000 mL (0 mLs Intravenous Stopped 10/07/16 1110)  acetaminophen (TYLENOL) tablet 650 mg (650 mg Oral Given 10/07/16 0915)     Initial Impression / Assessment and Plan / ED Course  I have reviewed the triage vital signs and the nursing notes.  Pertinent labs & imaging results that were available during my care of the patient were reviewed by me and considered in my medical decision making (see chart for details).  Clinical Course     BP 111/83   Pulse 88   Temp 98.1 F (36.7 C) (Oral)   Resp 16   Ht 6' (1.829 m)   Wt 78.9 kg   SpO2 98%   BMI 23.60 kg/m    Final Clinical Impressions(s) / ED Diagnoses   Final diagnoses:  Fall at nursing home, initial encounter  Orthostatic lightheadedness  Physical deconditioning    New Prescriptions New Prescriptions   No medications on file   7:34 AM Patient admitted to the hospital for 8 days, recently discharged to a rehabilitation facility yesterday. He fell today when he stood up to use the urinal. Suspect this is likely secondary to deconditioning from recent hospital sensation in recent sickness. He does not report any new pain but does have chronic chest abdomen and back pain. He is currently in Xarelto therefore I will obtain a  head and neck CT scan as well. X-rays of his low back and mid back. He is alert and oriented 3 able to move all 4 extremities and answer questions appropriately. His vital signs stable.  Care discussed with Dr. Leonette Monarch.   7:46 AM Patient is back pain is not new. Last hospitalization he was diagnosed with an L4/L5 epidural abscess with osteomyelitis and phlegmon formation. He received IV antibiotics through a PICC line.   10:04 AM Head and neck CT scan without acute injury. Thoracic and lumbar spine show  no acute fracture. Patient does have a positive orthostatic vital sign with his heart rate rising upon standing. He is mildly dehydrated therefore IV fluid given. Urine show resolution ofUTI, electrolytes panels are reassuring, no leukocytosis, normal troponin and EKG without acute ischemic changes.    Domenic Moras, PA-C 10/09/16 0912    Fatima Blank, MD 10/09/16 1710

## 2016-10-07 NOTE — Progress Notes (Signed)
LOCATION: [place]  PCP: Scarlette Calico, MD   Code Status: Full Code  Goals of care: Advanced Directive information Advanced Directives 10/07/2016  Does Patient Have a Medical Advance Directive? Yes  Type of Advance Directive Living will  Does patient want to make changes to medical advance directive? -  Copy of [place] in Chart? -  Would patient like information on creating a medical advance directive? -  Pre-existing out of facility DNR order (yellow form or pink MOST form) -       Extended Emergency Contact Information Primary Emergency Contact: Gibbins,Anthony Address: [address]          [place], [place] [address] [place] Phone: [phone] Mobile Phone: [phone] Relation: Brother Secondary Emergency Contact: Hazeline [place] Mobile Phone: [phone] Relation: Brother   Allergies  Allergen Reactions  . Metformin And Related Diarrhea  . Codeine Rash    All over the body    Chief Complaint  Patient presents with  . New Admit To SNF    New Admission Visit      HPI:  Patient is a 71 y.o. male seen today for short term rehabilitation post hospital [facility] with sepsis from Escherichia coli, pyelonephritis, L4-L5 epidural abscess with osteomyelitis. he was placed on IV antibiotic. Transesophageal echocardiogram was negative for vegetation. he was also treated for acute kidney injury. He has a PICC line placed prior to discharge for continuation of IV antibiotics. he has medical history of hypertension, diabetes mellitus, BPH among others. he is seen in her room today.  Review of Systems:  Constitutional: Negative for fever, chills, diaphoresis.  HENT: Negative for headache, congestion, nasal discharge, difficulty swallowing.   Eyes: Negative for blurred vision, double vision and discharge.  Respiratory: Negative for cough, shortness of breath and wheezing.     Cardiovascular: Negative for chest pain, palpitations, leg swelling.  Gastrointestinal: Negative for heartburn, nausea, vomiting, abdominal pain. Had bowel movement yesterday.  Genitourinary: Negative for dysuria and flank pain.  Musculoskeletal: Negative for fall. complaints of pain to chest wall and low back area. Pain medication helps.  Skin: Negative for itching, rash.  Neurological: Negative for dizziness. Psychiatric/Behavioral: Negative for depression   Past Medical History:  Diagnosis Date  . Clotting disorder (HCC)    DVT  . Diabetes mellitus    type II  . Diverticulosis   . DVT (deep venous thrombosis) ([place])   . Glaucoma    no eye drops   . Hepatic cyst   . Hypercholesterolemia   . Hypertension   . Myocardial infarction    very mild long ago   . Renal cyst   . Sleep apnea   . Tubulovillous adenoma    Past Surgical History:  Procedure Laterality Date  . BLADDER NECK RECONSTRUCTION  02/03/2012   Procedure: BLADDER NECK REPAIR;  Surgeon: Adin Hector, MD;  Location: [facility];  Service: General;;  . COLONOSCOPY  02/2013   polyp removal(mult.)  . KNEE SURGERY  2004   left  . POLYPECTOMY    . PROCTOSCOPY  02/03/2012   Procedure: PROCTOSCOPY;  Surgeon: Adin Hector, MD;  Location: [facility];  Service: General;;  . STOMACH SURGERY  02/2012  . TEE WITHOUT CARDIOVERSION N/A 10/05/2016   Procedure: TRANSESOPHAGEAL ECHOCARDIOGRAM (TEE);  Surgeon: Sanda Klein, MD;  Location: [facility];  Service: Cardiovascular;  Laterality: N/A;   Social History:   reports that he has never smoked.  He has never used smokeless tobacco. He reports that he drinks alcohol. He reports that he does not use drugs.  Family History  Problem Relation Age of Onset  . Hypertension Mother   . Diabetes Mother   . Cancer Mother     ? location  . Colon cancer Father 39  . Colon polyps Neg Hx     Medications: Allergies as of 10/07/2016      Reactions   Metformin And Related Diarrhea   Codeine  Rash   All over the body      Medication List       Accurate as of 10/07/16  3:10 PM. Always use your most recent med list.          cefTRIAXone IVPB Commonly known as:  ROCEPHIN Inject 2 g into the vein every 12 (twelve) hours. Indication:  Ecoli bacteremia with small epidural abscess/myositis Last Day of Therapy:  11/07/16 Labs - Once weekly:  CBC/D and BMP, Labs - Every other week:  ESR and CRP   HYDROcodone-acetaminophen 5-325 MG tablet Commonly known as:  NORCO/VICODIN Take 1 tablet by mouth every 6 (six) hours as needed for moderate pain.   Insulin Glargine 100 UNIT/ML Solostar Pen Commonly known as:  LANTUS SOLOSTAR Inject 25 units every night at bedtime   Insulin Pen Needle 31G X 5 MM Misc Administer lantus once nightly   methocarbamol 500 MG tablet Commonly known as:  ROBAXIN Take 1 tablet (500 mg total) by mouth every 8 (eight) hours as needed for muscle spasms.   multivitamin with minerals Tabs tablet Take 1 tablet by mouth daily.   polyethylene glycol packet Commonly known as:  MIRALAX / GLYCOLAX Take 17 g by mouth 2 (two) times daily.   rivaroxaban 20 MG Tabs tablet Commonly known as:  XARELTO Take 1 tablet (20 mg total) by mouth daily with supper.   simvastatin 40 MG tablet Commonly known as:  ZOCOR TAKE 1 TABLET(40 MG) BY MOUTH EVERY EVENING   tamsulosin 0.4 MG Caps capsule Commonly known as:  FLOMAX Take 0.4 mg by mouth daily after breakfast.       Immunizations: Immunization History  Administered Date(s) Administered  . Influenza,inj,Quad PF,36+ Mos 06/20/2013, 07/10/2014  . Influenza-Unspecified 06/05/2015, 07/26/2016  . Pneumococcal Conjugate-13 06/20/2013, 07/23/2014  . Pneumococcal Polysaccharide-23 06/19/2015  . Tdap 06/20/2013     Physical Exam: Vitals:   10/07/16 1506  BP: 110/73  Pulse: 88  Resp: 16  Temp: 97.8 F (36.6 C)  TempSrc: Oral  SpO2: 96%  Weight: 174 lb (78.9 kg)  Height: 6' (1.829 m)   Body mass index is  23.6 kg/m.  General- elderly male, well built, in no acute distress Head- normocephalic, atraumatic Nose- no nasal discharge Throat- moist mucus membrane Eyes- PERRLA, EOMI, no pallor, no icterus, no discharge, normal conjunctiva, normal sclera Neck- no cervical lymphadenopathy Cardiovascular- normal s1,s2, no murmur Respiratory- bilateral clear to auscultation, no wheeze, no rhonchi, no crackles, no use of accessory muscles Abdomen- bowel sounds present, soft, non tender Musculoskeletal- able to move all 4 extremities, generalized weakness Neurological- alert and oriented to person, place and time Skin- warm and dry Psychiatry- normal mood and affect    Labs reviewed: Basic Metabolic Panel:  Recent Labs  08/30/16 1609  09/28/16 0516 10/03/16 0149 10/07/16 0734  NA 130*  < > 138 137 138  K 3.9  < > 4.2 3.8 3.9  CL 94*  < > 108 102 104  CO2 23  < >  '24 25 22  ' GLUCOSE 320*  < > 98 111* 80  BUN 13  < > '10 14 16  ' CREATININE 0.96  < > 0.81 0.80 0.81  CALCIUM 8.8  9.1  < > 8.7* 8.8* 9.3  MG 1.9  --  1.6*  --   --   PHOS 4.2  --   --   --   --   < > = values in this interval not displayed. Liver Function Tests:  Recent Labs  08/18/16 0819 09/26/16 1835 09/28/16 0516  AST 30 32 21  ALT 26 21 15*  ALKPHOS 114 74 58  BILITOT 1.0 0.8 0.2*  PROT 7.0 8.5* 6.8  ALBUMIN 3.1* 2.9* 2.3*   No results for input(s): LIPASE, AMYLASE in the last 8760 hours. No results for input(s): AMMONIA in the last 8760 hours. CBC:  Recent Labs  09/27/16 0355 09/28/16 0516 10/03/16 0149 10/07/16 0734  WBC 17.3* 9.7 9.4 7.0  NEUTROABS 14.6* 7.1  --  4.9  HGB 9.2* 10.1* 9.2* 10.5*  HCT 27.8* 31.6* 29.2* 33.7*  MCV 87.7 88.8 87.4 89.6  PLT 239 240 346 327   Cardiac Enzymes:  Recent Labs  09/28/16 2108 09/29/16 0529 10/07/16 0734  TROPONINI 0.04* <0.03 <0.03   BNP: Invalid input(s): POCBNP CBG:  Recent Labs  10/06/16 0912 10/06/16 1153 10/06/16 1653  GLUCAP 110* 116*  141*    Radiological Exams: Dg Chest 2 View  Result Date: 09/26/2016 CLINICAL DATA:  Chest pain for several weeks EXAM: CHEST  2 VIEW COMPARISON:  08/17/2016 FINDINGS: The heart size and mediastinal contours are within normal limits. Both lungs are clear. The visualized skeletal structures are unremarkable. IMPRESSION: No active cardiopulmonary disease. Electronically Signed   By: Inez Catalina M.D.   On: 09/26/2016 20:32   Dg Thoracic Spine 2 View  Result Date: 10/07/2016 CLINICAL DATA:  [place] this morning, having mid/lower back pain, upper back/neck pain EXAM: THORACIC SPINE 2 VIEWS COMPARISON:  Chest radiographs 09/26/2016 FINDINGS: Twelve pairs of ribs. Bones demineralized. Compression fracture mid thoracic spine with 50% height loss of T8 vertebral body, unchanged. Remaining vertebral body heights maintained. Endplate spur formation lower thoracic spine. No definite acute fracture, subluxation, or bone destruction. IMPRESSION: Chronic compression fracture T8 vertebral body. No acute abnormalities. Electronically Signed   By: Lavonia Dana M.D.   On: 10/07/2016 08:09   Dg Lumbar Spine Complete  Result Date: 10/07/2016 CLINICAL DATA:  [place] this morning, having mid/lower back pain, upper back/neck pain EXAM: LUMBAR SPINE - COMPLETE 4+ VIEW COMPARISON:  MRI lumbar spine 09/30/2016 FINDINGS: Diffuse osseous demineralization. Five non-rib-bearing lumbar vertebra. Scattered mild degenerative disc disease changes with small endplate spurs at several levels. Vertebral body heights maintained without fracture or subluxation. No bone destruction or spondylolysis. SI joints preserved. IMPRESSION: Mild degenerative disc disease changes lumbar spine. No acute abnormalities. Electronically Signed   By: Lavonia Dana M.D.   On: 10/07/2016 08:07   Ct Head Wo Contrast  Result Date: 10/07/2016 CLINICAL DATA:  Recent fall with head injury and neck pain, initial encounter EXAM: CT HEAD WITHOUT CONTRAST CT CERVICAL SPINE  WITHOUT CONTRAST TECHNIQUE: Multidetector CT imaging of the head and cervical spine was performed following the standard protocol without intravenous contrast. Multiplanar CT image reconstructions of the cervical spine were also generated. COMPARISON:  None. FINDINGS: CT HEAD FINDINGS Brain: Mild atrophic changes are noted. A cavum septum pellucidum is seen. No findings to suggest acute hemorrhage, acute infarction or space-occupying mass lesion are noted.  Vascular: No hyperdense vessel or unexpected calcification. Skull: Normal. Negative for fracture or focal lesion. Sinuses/Orbits: No acute finding. Other: None. CT CERVICAL SPINE FINDINGS Alignment: Vertebral body alignment is within normal limits. Skull base and vertebrae: 7 cervical segments are well visualized. Osteophytic changes are noted at all levels. Mild facet hypertrophic changes are seen as well. No fracture or acute facet abnormality is noted. Soft tissues and spinal canal: Within normal limits. Disc levels: Disc space narrowing is noted worst at C3-4 and C5-6. As described above multilevel osteophytic changes are noted. Upper chest: Within normal limits.  Right-sided PICC line is seen. Other: None IMPRESSION: CT of the head: Atrophic changes are noted without acute intracranial abnormality. CT of cervical spine: Degenerative change without acute abnormality. Electronically Signed   By: Inez Catalina M.D.   On: 10/07/2016 08:36   Ct Cervical Spine Wo Contrast  Result Date: 10/07/2016 CLINICAL DATA:  Recent fall with head injury and neck pain, initial encounter EXAM: CT HEAD WITHOUT CONTRAST CT CERVICAL SPINE WITHOUT CONTRAST TECHNIQUE: Multidetector CT imaging of the head and cervical spine was performed following the standard protocol without intravenous contrast. Multiplanar CT image reconstructions of the cervical spine were also generated. COMPARISON:  None. FINDINGS: CT HEAD FINDINGS Brain: Mild atrophic changes are noted. A cavum septum  pellucidum is seen. No findings to suggest acute hemorrhage, acute infarction or space-occupying mass lesion are noted. Vascular: No hyperdense vessel or unexpected calcification. Skull: Normal. Negative for fracture or focal lesion. Sinuses/Orbits: No acute finding. Other: None. CT CERVICAL SPINE FINDINGS Alignment: Vertebral body alignment is within normal limits. Skull base and vertebrae: 7 cervical segments are well visualized. Osteophytic changes are noted at all levels. Mild facet hypertrophic changes are seen as well. No fracture or acute facet abnormality is noted. Soft tissues and spinal canal: Within normal limits. Disc levels: Disc space narrowing is noted worst at C3-4 and C5-6. As described above multilevel osteophytic changes are noted. Upper chest: Within normal limits.  Right-sided PICC line is seen. Other: None IMPRESSION: CT of the head: Atrophic changes are noted without acute intracranial abnormality. CT of cervical spine: Degenerative change without acute abnormality. Electronically Signed   By: Inez Catalina M.D.   On: 10/07/2016 08:36   Mr Lumbar Spine W Wo Contrast  Result Date: 09/30/2016 CLINICAL DATA:  Back pain. Recent sepsis due to pyelonephritis and E coli bacteremia. EXAM: MRI LUMBAR SPINE WITHOUT AND WITH CONTRAST TECHNIQUE: Multiplanar and multiecho pulse sequences of the lumbar spine were obtained without and with intravenous contrast. CONTRAST:  24m MULTIHANCE GADOBENATE DIMEGLUMINE 529 MG/ML IV SOLN COMPARISON:  None. FINDINGS: Segmentation:  Normal segmentation. Alignment:  Mild retrolisthesis L1-2 Vertebrae: Negative for fracture or mass. Heterogeneous bone marrow diffusely. Possibly due to anemia. No evidence of discitis. Conus medullaris: Extends to the T12-L1 level and appears normal. Paraspinal and other soft tissues: Bilateral renal cysts. No retroperitoneal mass. Psoas muscles symmetric. There is edema in the paraspinous muscles bilaterally with enhancement suggesting  myositis. Disc levels: L1-2: Mild retrolisthesis. Disc and facet degeneration. Mild spinal stenosis. L2-3:  Negative L3-4:  Mild disc and facet degeneration without stenosis L4-5: Diffuse disc bulging. Extensive facet degeneration with bony overgrowth and bilateral facet joint effusions. 5 mm posterior epidural fluid collection is present on the right side probably arising from the facet joint. There is surrounding epidural enhancement above and below the cyst. This could represent a small abscess however could also be noninfected. In addition, there is a 8 mm fluid collection  to the left of the L4 spinous process which does not enhance. This could be a small abscess as well. There is surrounding myositis. L5-S1:  Negative IMPRESSION: Heterogeneous bone marrow with overall benign appearance possibly due to anemia and prominent red marrow Severe facet degeneration at L4-5 with bilateral facet joint effusions. Right posterior epidural fluid collection 5 mm with surrounding epidural enhancement, concerning for epidural abscess versus synovial cyst. 8 mm fluid collection to the left of the L4 spinous process could also represent an abscess although it does not enhance as would be expected. There is edema and enhancement in the erector spinae muscles bilaterally suggesting myositis. Overall the findings are concerning for spinal infection. These results were called by telephone at the time of interpretation on 09/30/2016 at 6:21 pm to Dr. Oren Binet , who verbally acknowledged these results. Electronically Signed   By: Franchot Gallo M.D.   On: 09/30/2016 18:23   Ct Abdomen Pelvis W Contrast  Result Date: 09/26/2016 CLINICAL DATA:  Mid abdominal pain radiating to his back x1 day. EXAM: CT ABDOMEN AND PELVIS WITH CONTRAST TECHNIQUE: Multidetector CT imaging of the abdomen and pelvis was performed using the standard protocol following bolus administration of intravenous contrast. CONTRAST:  155m ISOVUE-300  IOPAMIDOL (ISOVUE-300) INJECTION 61% COMPARISON:  08/31/2016 go FINDINGS: Lower chest: There is mild bilateral lower lobe bronchiectasis. Clearing of bibasilar atelectasis and/or pneumonia. Top-normal sized heart without pericardial nor pleural effusions. No pneumothoraces. Hepatobiliary: No space-occupying mass of the liver. Stable 6 mm hypodensity in the left hepatic lobe statistically consistent with a cyst or hemangioma. No intrahepatic ductal dilatation. Tiny dependent gallstones without secondary stoma signs: Cystitis. Pancreas: No pancreatic mass. A few speckled calcifications along the pancreatic tail may be vascular in etiology or related to chronic pancreatitis. No pancreatic ductal dilatation or surrounding inflammatory changes. Spleen: Normal in size without focal abnormality. Adrenals/Urinary Tract: Normal adrenal glands. Multiple hypodense lesions of both kidneys consistent with cysts remain unchanged. The largest are in the upper pole on the right measuring up to 5.7 cm and on the left 3.7 cm. No obstructive uropathy. No nephrolithiasis. No solid enhancing mass lesions. Mild circumferential thickening of the bladder is again noted which may be related to underdistention and/or cystitis. Stomach/Bowel: The stomach is contracted in appearance. There is moderate colonic stool burden without acute inflammation. Normal-appearing appendix. No small bowel obstruction. Scattered colonic diverticulosis. Vascular/Lymphatic: Aortic atherosclerosis. No enlarged abdominal or pelvic lymph nodes. Reproductive: Irregular enlarged prostate gland containing central zone calcifications are unchanged in appearance. This impresses upon the base of the bladder as before. Other: No free air or free fluid. Musculoskeletal: No acute nor suspicious osseous abnormality. Lower lumbar degenerative facet arthropathy. IMPRESSION: 1. Clearing of bibasilar atelectasis and/or pneumonia since prior exam. Bilateral lower lobe  bronchiectasis is incidentally noted. 2. Stable bilateral renal hypodensities consistent with cysts. No obstructive uropathy. 3. Uncomplicated cholelithiasis. 4. Chronic mild circumferential thickening of the urinary bladder as before may be related to cystitis. 5. Stable prostatomegaly. 6. Lower lumbar facet arthropathy. 7. Moderate colonic stool burden. No bowel obstruction or acute inflammation. Electronically Signed   By: DAshley RoyaltyM.D.   On: 09/26/2016 21:39    Assessment/Plan  Generalized weakness From physical deconditioning. Will need him to work with physical therapy and occupational therapy to help restore his strength and balance.  Escherichia coli bacteremia Continue and complete IV Rocephin on fourth of February 2018. Monitor WBC and temperature curve. Get weekly CBC, CMP, ESR and CRP.  L4-L5 epidural  abscess with osteomyelitis Continue and complete course of IV antibiotic as above. Continue PICC line care. Monitor clinically. Continue Norco 5-3 25 mg 1 tablet every 6 hours as needed for pain. Get PMR consult. Continue Robaxin 500 mg every 8 hours as needed for muscle spasm. Will need repeat MRI of lumbosacral spine after completion of antibiotic.  History of DVT Continue [place] for now  Constipation Continue MiraLAX twice a day and monitor.  BPH Continue Flomax  Type 2 diabetes mellitus Continue Lantus 25 units daily at bedtime. Monitor CBG. Continue statin.   Goals of care: short term rehabilitation   Labs/tests ordered: CBC, CMP, ESR, CRP weekly until completion of antibiotic  Family/ staff Communication: reviewed care plan with patient and nursing supervisor    Blanchie Serve, MD Internal Medicine [place], [place] [address] Cell Phone (Monday-Friday 8 am - 5 pm): [phone] On Call: [phone] and follow prompts after 5 pm and on weekends Office Phone: [phone] Office Fax:  [phone]

## 2016-10-07 NOTE — Telephone Encounter (Signed)
Pt was on the TCM list admitted Sepsis due to urinary tract infection. Pt was D/c on 1/2, and sent to SNF. On d/c summary it stated after pt get discharge from SNF will need to f/u w/PCP in 1-2 weeks...[name]

## 2016-10-07 NOTE — ED Triage Notes (Signed)
Patient comes in [place] with post fall. Patient from [facility] after falling forward. Hit head. Patient on xarleto. Denies LOC. Patient c/o dizziness and back pain post fall. BP 134/70. EKG unremarkable. Hx of DM. fsbs 99. Patient was recently here for sepsis. Patient has [place].

## 2016-10-08 ENCOUNTER — Encounter: Payer: Self-pay | Admitting: Licensed Clinical Social Worker

## 2016-10-08 ENCOUNTER — Other Ambulatory Visit: Payer: Self-pay | Admitting: Internal Medicine

## 2016-10-08 ENCOUNTER — Other Ambulatory Visit: Payer: Self-pay | Admitting: Licensed Clinical Social Worker

## 2016-10-08 DIAGNOSIS — M4627 Osteomyelitis of vertebra, lumbosacral region: Secondary | ICD-10-CM

## 2016-10-08 DIAGNOSIS — G060 Intracranial abscess and granuloma: Secondary | ICD-10-CM | POA: Diagnosis not present

## 2016-10-08 NOTE — Patient Outreach (Signed)
[place] [facility]) Care Management  [facility] Social Work  10/08/2016  Charles Parker 11/01/1945 [id number]  Encounter Medications:  Outpatient Encounter Prescriptions as of 10/08/2016  Medication Sig  . cefTRIAXone (ROCEPHIN) IVPB Inject 2 g into the vein every 12 (twelve) hours. Indication:  Ecoli bacteremia with small epidural abscess/myositis Last Day of Therapy:  11/07/16 Labs - Once weekly:  CBC/D and BMP, Labs - Every other week:  ESR and CRP  . HYDROcodone-acetaminophen (NORCO/VICODIN) 5-325 MG tablet Take 1 tablet by mouth every 6 (six) hours as needed for moderate pain.  . Insulin Glargine (LANTUS SOLOSTAR) 100 UNIT/ML Solostar Pen Inject 25 units every night at bedtime  . Insulin Pen Needle 31G X 5 MM MISC Administer lantus once nightly  . methocarbamol (ROBAXIN) 500 MG tablet Take 1 tablet (500 mg total) by mouth every 8 (eight) hours as needed for muscle spasms.  . Multiple Vitamin (MULTIVITAMIN WITH MINERALS) TABS tablet Take 1 tablet by mouth daily.  . polyethylene glycol (MIRALAX / GLYCOLAX) packet Take 17 g by mouth 2 (two) times daily.  . rivaroxaban (XARELTO) 20 MG TABS tablet Take 1 tablet (20 mg total) by mouth daily with supper.  . simvastatin (ZOCOR) 40 MG tablet TAKE 1 TABLET(40 MG) BY MOUTH EVERY EVENING  . tamsulosin (FLOMAX) 0.4 MG CAPS capsule Take 0.4 mg by mouth daily after breakfast.    No facility-administered encounter medications on file as of 10/08/2016.     Functional Status:  In your present state of health, do you have any difficulty performing the following activities: 09/26/2016 09/26/2016  Hearing? - N  Vision? - N  Difficulty concentrating or making decisions? - N  Walking or climbing stairs? - N  Dressing or bathing? - N  Doing errands, shopping? Y -  Conservation officer, nature and eating ? - -  Using the Toilet? - -  In the past six months, have you accidently leaked urine? - -  Do you have problems with loss of bowel control? - -  Managing  your Medications? - -  Managing your Finances? - -  Housekeeping or managing your Housekeeping? - -  Some recent data might be hidden    Fall/Depression Screening:  PHQ 2/9 Scores 10/08/2016 09/22/2016 08/24/2016 01/01/2016 08/18/2015 07/17/2015 07/14/2015  PHQ - 2 Score 0 0 0 0 0 0 0    Assessment: CSW completed SNF visit at [facility] on 10/08/16. CSW has previously worked with patient in the past. Patient had a hospitalization for sepsis 08/17/16-08/20/16. Patient then was hospitalized on 12/24 with fever, back pain and left flank pain. Patient had a MRI of the brain and it showed epidural abscess/phlegmon/osteomyelitis. Patient is in an extreme amount of pain but is willing to complete assessment today. Patient lives alone in an apartment. He has two very support brothers and a daughter. Patient reports that his family members come to check on him 2-3 times per week. He states that they provide stable transportation to all of his medical appointments. Patient denies needing transportation resources such as Engineer, water. Patient denies needing to make changes to advance directives. Patient is interested in receiving [employer], Food Stamps and Medicaid. He reports that [place] had been assisting him with Medicaid and Food Stamps but he never heard from [place]. CSW provided new Medicaid application for review and a list of what documents he will need to have ready when he applies for Medicaid. CSW informed him that it will be best to apply for  Medicaid in person at [place]. Patient is familiar with location. CSW reviewed entire Medicaid application so that patient is familiar on what information they will need. CSW then completed entire food stamps application with patient. CSW will fax completed food stamps application. CSW reviewed and provided hand out on financial resources, Development worker, community, [employer] and [employer]. Patient appreciative of information. Patient is agreeable to CSW approving  patient for 30 days of Mobile Meals once he is discharging from SNF through [facility] contract and also putting patient on the wait list for Mobile Meals once he discharges from SNF. CSW will also make referral to [facility] dining meals as well. Patient reports that he has an eye appointment next month. He reports that he gains socialization by being around family and is very involved within his church community. Patient appreciative of CSW's time and resource information and assistance.   CSW spoke to SNF social worker and provided listed updates. CSW informed social worker that patient only has a cane and will need a walker after discharge from SNF.  CSW completed call to patient's brother [name] after visit. HIPPA verifications were provided. Brother reports that he and his brother are willing to gather needed Medicaid documents for patient and then take him to [place] office to apply after SNF discharge. CSW provided Medicaid application process information. Brother request that [place] email him helpful tips and trick handout on applying for Medicaid. Brother's email address is [email or web address]. Brother Patent attorney of social work assistance.    [facility] CM Care Plan Problem One   Flowsheet Row Most Recent Value  Care Plan Problem One  SNF admission and lack of resources  Role Documenting the Problem One  Clinical Social Worker  Care Plan for Problem One  Active  [facility] Long Term Goal (31-90 days)  Patient will have a safe and stable discharging back home from SNF within 90 days per self report  [facility] Long Term Goal Start Date  10/08/16  Interventions for Problem One Long Term Goal  CSW will coordinate care with SNF social worker, family and patient to ensure a safe discharge from [facility] back home with the needed medical equipment, [facility] services and resources in place.   [facility] CM Short Term Goal #1 (0-30 days)  Patient will gain food stamps within 30 days as evidenced by checking his mail and informing CSW  [facility] CM  Short Term Goal #1 Start Date  10/08/16  Interventions for Short Term Goal #1  CSW completed SNF visit and completed food stamps application with patient. Patient has a history of not checking his mail box or following up with referrals that have been made in the past. CSW will provide motivational interviewing interventions if needed.  [facility] CM Short Term Goal #2 (0-30 days)  Patient will attend all scheduled medical appointments within the next 30 days  [facility] CM Short Term Goal #2 Start Date  10/08/16  Interventions for Short Term Goal #2  CSW will provide motivational interviewing interventions, encouragement and emotional support to assist patient in attending all upcoming medical appointments while in SNF      Plan: CSW will route letter to PCP. CSW will fax completed food stamps application. CSW will make referral to Mobile Meals and [employer] meals after discharge. CSW will coordinate care with SNF social worker within one week.   [name], BSW, MSW, [email or web address] Phone: [phone] Fax: [phone]

## 2016-10-11 ENCOUNTER — Other Ambulatory Visit: Payer: Self-pay | Admitting: Licensed Clinical Social Worker

## 2016-10-11 DIAGNOSIS — G060 Intracranial abscess and granuloma: Secondary | ICD-10-CM | POA: Diagnosis not present

## 2016-10-11 LAB — CBC AND DIFFERENTIAL
HEMATOCRIT: 35 % — AB (ref 41–53)
HEMOGLOBIN: 10.9 g/dL — AB (ref 13.5–17.5)
PLATELETS: 348 10*3/uL (ref 150–399)
WBC: 5.1 10*3/mL

## 2016-10-11 LAB — BASIC METABOLIC PANEL
BUN: 11 mg/dL (ref 4–21)
Creatinine: 0.7 mg/dL (ref 0.6–1.3)
GLUCOSE: 62 mg/dL
Potassium: 4.1 mmol/L (ref 3.4–5.3)
SODIUM: 140 mmol/L (ref 137–147)

## 2016-10-11 LAB — HEPATIC FUNCTION PANEL
ALT: 21 U/L (ref 10–40)
AST: 19 U/L (ref 14–40)
Alkaline Phosphatase: 71 U/L (ref 25–125)
BILIRUBIN, TOTAL: 0.4 mg/dL

## 2016-10-11 NOTE — Patient Outreach (Signed)
[facility] [facility]) Care Management  10/11/2016  KIPP HONER 24-Jun-1946 [id number]   Assessment- CSW received incoming call from patient's brother who stated that he believes patient has already been approved for food stamps. He states that there is a card in his mail and a letter stating that he can receive $15.00 in food stamps. He states that he has not activated card yet but will do so. CSW no longer needs to fax completed food stamps application that was done last week during SNF visit.  Plan-CSW will follow up with SNF within 1 week.  [name], BSW, MSW, [email or web address] Phone: [phone] Fax: [phone]

## 2016-10-14 ENCOUNTER — Other Ambulatory Visit: Payer: Self-pay | Admitting: Licensed Clinical Social Worker

## 2016-10-15 ENCOUNTER — Other Ambulatory Visit: Payer: Commercial Managed Care - HMO

## 2016-10-15 ENCOUNTER — Other Ambulatory Visit: Payer: Self-pay | Admitting: Licensed Clinical Social Worker

## 2016-10-15 DIAGNOSIS — G060 Intracranial abscess and granuloma: Secondary | ICD-10-CM | POA: Diagnosis not present

## 2016-10-15 NOTE — Patient Outreach (Signed)
Encounter made in error. 

## 2016-10-15 NOTE — Patient Outreach (Signed)
[place] [facility]) Care Management  10/15/2016  Charles Parker [birth year] [id number]   Assessment- CSW completed outreach call to SNF social worker at [facility] in order to gain updates on patient's status. SNF social worker reports that patient is still benefiting from physical and occupational therapy. She reports that they continue to work with patient on activity tolerance, strength and balance and muscle control in physical therapy. They are also working with patient on bathing, dressing and transfers in occupational therapy. They're main goal is for patient to be able to bath himself once he returns home from SNF since he lives alone. CSW was informed that there is an expected discharge date on 10/22/16.  Plan-CSW will follow up with SNF or patient within one week and continue to plan for discharge.  [name], BSW, MSW, [email or web address] Phone: [phone] Fax: [phone]

## 2016-10-18 DIAGNOSIS — G060 Intracranial abscess and granuloma: Secondary | ICD-10-CM | POA: Diagnosis not present

## 2016-10-21 ENCOUNTER — Other Ambulatory Visit: Payer: Self-pay | Admitting: Licensed Clinical Social Worker

## 2016-10-21 ENCOUNTER — Encounter (HOSPITAL_COMMUNITY): Payer: Self-pay | Admitting: Emergency Medicine

## 2016-10-21 ENCOUNTER — Emergency Department (HOSPITAL_COMMUNITY): Payer: Medicare HMO

## 2016-10-21 ENCOUNTER — Inpatient Hospital Stay (HOSPITAL_COMMUNITY)
Admission: EM | Admit: 2016-10-21 | Discharge: 2016-10-27 | DRG: 456 | Disposition: A | Discharge status: Skilled Nursing Facility | Payer: Medicare HMO | Attending: Internal Medicine | Admitting: Internal Medicine

## 2016-10-21 DIAGNOSIS — R9389 Abnormal findings on diagnostic imaging of other specified body structures: Secondary | ICD-10-CM

## 2016-10-21 DIAGNOSIS — Z95828 Presence of other vascular implants and grafts: Secondary | ICD-10-CM | POA: Diagnosis not present

## 2016-10-21 DIAGNOSIS — R109 Unspecified abdominal pain: Secondary | ICD-10-CM | POA: Diagnosis not present

## 2016-10-21 DIAGNOSIS — N401 Enlarged prostate with lower urinary tract symptoms: Secondary | ICD-10-CM | POA: Diagnosis present

## 2016-10-21 DIAGNOSIS — Z86718 Personal history of other venous thrombosis and embolism: Secondary | ICD-10-CM | POA: Diagnosis not present

## 2016-10-21 DIAGNOSIS — E7800 Pure hypercholesterolemia, unspecified: Secondary | ICD-10-CM | POA: Diagnosis present

## 2016-10-21 DIAGNOSIS — M4624 Osteomyelitis of vertebra, thoracic region: Principal | ICD-10-CM | POA: Diagnosis present

## 2016-10-21 DIAGNOSIS — R29898 Other symptoms and signs involving the musculoskeletal system: Secondary | ICD-10-CM

## 2016-10-21 DIAGNOSIS — R3916 Straining to void: Secondary | ICD-10-CM | POA: Diagnosis present

## 2016-10-21 DIAGNOSIS — M549 Dorsalgia, unspecified: Secondary | ICD-10-CM | POA: Diagnosis not present

## 2016-10-21 DIAGNOSIS — I252 Old myocardial infarction: Secondary | ICD-10-CM | POA: Diagnosis not present

## 2016-10-21 DIAGNOSIS — R404 Transient alteration of awareness: Secondary | ICD-10-CM | POA: Diagnosis not present

## 2016-10-21 DIAGNOSIS — Z981 Arthrodesis status: Secondary | ICD-10-CM | POA: Diagnosis not present

## 2016-10-21 DIAGNOSIS — G062 Extradural and subdural abscess, unspecified: Secondary | ICD-10-CM | POA: Diagnosis not present

## 2016-10-21 DIAGNOSIS — G8918 Other acute postprocedural pain: Secondary | ICD-10-CM

## 2016-10-21 DIAGNOSIS — Z794 Long term (current) use of insulin: Secondary | ICD-10-CM | POA: Diagnosis not present

## 2016-10-21 DIAGNOSIS — I824Y9 Acute embolism and thrombosis of unspecified deep veins of unspecified proximal lower extremity: Secondary | ICD-10-CM | POA: Diagnosis present

## 2016-10-21 DIAGNOSIS — S22009A Unspecified fracture of unspecified thoracic vertebra, initial encounter for closed fracture: Secondary | ICD-10-CM | POA: Diagnosis present

## 2016-10-21 DIAGNOSIS — E785 Hyperlipidemia, unspecified: Secondary | ICD-10-CM | POA: Diagnosis present

## 2016-10-21 DIAGNOSIS — M546 Pain in thoracic spine: Secondary | ICD-10-CM | POA: Diagnosis present

## 2016-10-21 DIAGNOSIS — E119 Type 2 diabetes mellitus without complications: Secondary | ICD-10-CM | POA: Diagnosis not present

## 2016-10-21 DIAGNOSIS — H409 Unspecified glaucoma: Secondary | ICD-10-CM | POA: Diagnosis present

## 2016-10-21 DIAGNOSIS — Z7901 Long term (current) use of anticoagulants: Secondary | ICD-10-CM | POA: Diagnosis not present

## 2016-10-21 DIAGNOSIS — G8220 Paraplegia, unspecified: Secondary | ICD-10-CM | POA: Diagnosis not present

## 2016-10-21 DIAGNOSIS — G9520 Unspecified cord compression: Secondary | ICD-10-CM | POA: Diagnosis not present

## 2016-10-21 DIAGNOSIS — E1165 Type 2 diabetes mellitus with hyperglycemia: Secondary | ICD-10-CM

## 2016-10-21 DIAGNOSIS — I10 Essential (primary) hypertension: Secondary | ICD-10-CM | POA: Diagnosis not present

## 2016-10-21 DIAGNOSIS — Z9889 Other specified postprocedural states: Secondary | ICD-10-CM | POA: Diagnosis not present

## 2016-10-21 DIAGNOSIS — Z885 Allergy status to narcotic agent status: Secondary | ICD-10-CM | POA: Diagnosis not present

## 2016-10-21 DIAGNOSIS — G4730 Sleep apnea, unspecified: Secondary | ICD-10-CM | POA: Diagnosis present

## 2016-10-21 DIAGNOSIS — D62 Acute posthemorrhagic anemia: Secondary | ICD-10-CM

## 2016-10-21 DIAGNOSIS — E118 Type 2 diabetes mellitus with unspecified complications: Secondary | ICD-10-CM | POA: Diagnosis not present

## 2016-10-21 DIAGNOSIS — Z79899 Other long term (current) drug therapy: Secondary | ICD-10-CM

## 2016-10-21 DIAGNOSIS — B9689 Other specified bacterial agents as the cause of diseases classified elsewhere: Secondary | ICD-10-CM | POA: Diagnosis not present

## 2016-10-21 DIAGNOSIS — M6281 Muscle weakness (generalized): Secondary | ICD-10-CM | POA: Diagnosis not present

## 2016-10-21 DIAGNOSIS — R531 Weakness: Secondary | ICD-10-CM | POA: Diagnosis not present

## 2016-10-21 DIAGNOSIS — R1310 Dysphagia, unspecified: Secondary | ICD-10-CM | POA: Diagnosis not present

## 2016-10-21 DIAGNOSIS — IMO0002 Reserved for concepts with insufficient information to code with codable children: Secondary | ICD-10-CM

## 2016-10-21 DIAGNOSIS — G061 Intraspinal abscess and granuloma: Secondary | ICD-10-CM | POA: Diagnosis present

## 2016-10-21 DIAGNOSIS — Z833 Family history of diabetes mellitus: Secondary | ICD-10-CM | POA: Diagnosis not present

## 2016-10-21 DIAGNOSIS — Z9181 History of falling: Secondary | ICD-10-CM | POA: Diagnosis not present

## 2016-10-21 DIAGNOSIS — R488 Other symbolic dysfunctions: Secondary | ICD-10-CM | POA: Diagnosis not present

## 2016-10-21 DIAGNOSIS — M4644 Discitis, unspecified, thoracic region: Secondary | ICD-10-CM | POA: Diagnosis present

## 2016-10-21 DIAGNOSIS — I739 Peripheral vascular disease, unspecified: Secondary | ICD-10-CM | POA: Diagnosis not present

## 2016-10-21 DIAGNOSIS — Z419 Encounter for procedure for purposes other than remedying health state, unspecified: Secondary | ICD-10-CM

## 2016-10-21 DIAGNOSIS — R938 Abnormal findings on diagnostic imaging of other specified body structures: Secondary | ICD-10-CM | POA: Diagnosis not present

## 2016-10-21 DIAGNOSIS — M898X8 Other specified disorders of bone, other site: Secondary | ICD-10-CM | POA: Diagnosis not present

## 2016-10-21 DIAGNOSIS — M4324 Fusion of spine, thoracic region: Secondary | ICD-10-CM | POA: Diagnosis not present

## 2016-10-21 DIAGNOSIS — D72829 Elevated white blood cell count, unspecified: Secondary | ICD-10-CM

## 2016-10-21 LAB — COMPREHENSIVE METABOLIC PANEL
ALBUMIN: 2.5 g/dL — AB (ref 3.5–5.0)
ALK PHOS: 56 U/L (ref 38–126)
ALT: 16 U/L — AB (ref 17–63)
ANION GAP: 9 (ref 5–15)
AST: 22 U/L (ref 15–41)
BUN: 11 mg/dL (ref 6–20)
CALCIUM: 9.1 mg/dL (ref 8.9–10.3)
CHLORIDE: 103 mmol/L (ref 101–111)
CO2: 26 mmol/L (ref 22–32)
CREATININE: 0.76 mg/dL (ref 0.61–1.24)
GFR calc Af Amer: >60 mL/min (ref >60)
GFR calc non Af Amer: >60 mL/min (ref >60)
GLUCOSE: 113 mg/dL — AB (ref 65–99)
Potassium: 3.7 mmol/L (ref 3.5–5.1)
SODIUM: 138 mmol/L (ref 135–145)
Total Bilirubin: 0.3 mg/dL (ref 0.3–1.2)
Total Protein: 7.4 g/dL (ref 6.5–8.1)

## 2016-10-21 LAB — CBC WITH DIFFERENTIAL/PLATELET
BASOS PCT: 1 %
Basophils Absolute: 0 10*3/uL (ref 0.0–0.1)
EOS ABS: 0.1 10*3/uL (ref 0.0–0.7)
EOS PCT: 2 %
HCT: 32.1 % — ABNORMAL LOW (ref 39.0–52.0)
HEMOGLOBIN: 10 g/dL — AB (ref 13.0–17.0)
Lymphocytes Relative: 19 %
Lymphs Abs: 1.2 10*3/uL (ref 0.7–4.0)
MCH: 27.5 pg (ref 26.0–34.0)
MCHC: 31.2 g/dL (ref 30.0–36.0)
MCV: 88.4 fL (ref 78.0–100.0)
Monocytes Absolute: 0.5 10*3/uL (ref 0.1–1.0)
Monocytes Relative: 8 %
NEUTROS PCT: 70 %
Neutro Abs: 4.6 10*3/uL (ref 1.7–7.7)
PLATELETS: 236 10*3/uL (ref 150–400)
RBC: 3.63 MIL/uL — ABNORMAL LOW (ref 4.22–5.81)
RDW: 14.3 % (ref 11.5–15.5)
WBC: 6.5 10*3/uL (ref 4.0–10.5)

## 2016-10-21 LAB — URINALYSIS, ROUTINE W REFLEX MICROSCOPIC
Bilirubin Urine: NEGATIVE
GLUCOSE, UA: NEGATIVE mg/dL
Ketones, ur: NEGATIVE mg/dL
Leukocytes, UA: NEGATIVE
NITRITE: NEGATIVE
PROTEIN: NEGATIVE mg/dL
SPECIFIC GRAVITY, URINE: 1.015 (ref 1.005–1.030)
Squamous Epithelial / LPF: NONE SEEN
WBC, UA: NONE SEEN WBC/hpf (ref 0–5)
pH: 8 (ref 5.0–8.0)

## 2016-10-21 LAB — LIPASE, BLOOD: Lipase: 15 U/L (ref 11–51)

## 2016-10-21 LAB — GLUCOSE, CAPILLARY: GLUCOSE-CAPILLARY: 163 mg/dL — AB (ref 65–99)

## 2016-10-21 MED ORDER — RIVAROXABAN 20 MG PO TABS
20.0000 mg | ORAL_TABLET | Freq: Every day | ORAL | Status: DC
  Filled 2016-10-21: qty 1

## 2016-10-21 MED ORDER — SIMVASTATIN 40 MG PO TABS
40.0000 mg | ORAL_TABLET | Freq: Every day | ORAL | Status: DC
  Administered 2016-10-23 – 2016-10-27 (×5): 40 mg via ORAL
  Filled 2016-10-21 (×5): qty 1

## 2016-10-21 MED ORDER — ONDANSETRON HCL 4 MG PO TABS: 4.0000 mg | ORAL_TABLET | Freq: Four times a day (QID) | ORAL | Status: DC | PRN

## 2016-10-21 MED ORDER — DEXTROSE 5 % IV SOLN
2.0000 g | Freq: Two times a day (BID) | INTRAVENOUS | Status: DC
  Administered 2016-10-21 – 2016-10-27 (×12): 2 g via INTRAVENOUS
  Filled 2016-10-21 (×15): qty 2

## 2016-10-21 MED ORDER — GADOBENATE DIMEGLUMINE 529 MG/ML IV SOLN
17.0000 mL | Freq: Once | INTRAVENOUS | Status: AC | PRN
  Administered 2016-10-21: 17 mL via INTRAVENOUS

## 2016-10-21 MED ORDER — HYDROCODONE-ACETAMINOPHEN 5-325 MG PO TABS
1.0000 | ORAL_TABLET | Freq: Four times a day (QID) | ORAL | Status: DC | PRN
  Administered 2016-10-21 – 2016-10-27 (×12): 1 via ORAL
  Filled 2016-10-21 (×12): qty 1

## 2016-10-21 MED ORDER — ACETAMINOPHEN 325 MG PO TABS
650.0000 mg | ORAL_TABLET | Freq: Four times a day (QID) | ORAL | Status: DC | PRN
Start: 2016-10-21 — End: 2016-10-27

## 2016-10-21 MED ORDER — HYDROCODONE-ACETAMINOPHEN 5-325 MG PO TABS
1.0000 | ORAL_TABLET | Freq: Once | ORAL | Status: AC
  Administered 2016-10-21: 1 via ORAL
  Filled 2016-10-21: qty 1

## 2016-10-21 MED ORDER — ACETAMINOPHEN 650 MG RE SUPP
650.0000 mg | Freq: Four times a day (QID) | RECTAL | Status: DC | PRN
Start: 2016-10-21 — End: 2016-10-27

## 2016-10-21 MED ORDER — METHOCARBAMOL 500 MG PO TABS
500.0000 mg | ORAL_TABLET | Freq: Three times a day (TID) | ORAL | Status: DC | PRN
  Administered 2016-10-23 – 2016-10-25 (×4): 500 mg via ORAL
  Filled 2016-10-21 (×4): qty 1

## 2016-10-21 MED ORDER — POLYETHYLENE GLYCOL 3350 17 G PO PACK
17.0000 g | PACK | Freq: Two times a day (BID) | ORAL | Status: DC
  Administered 2016-10-21 – 2016-10-26 (×9): 17 g via ORAL
  Filled 2016-10-21 (×9): qty 1

## 2016-10-21 MED ORDER — SODIUM CHLORIDE 0.9 % IV SOLN
INTRAVENOUS | Status: DC
  Administered 2016-10-21: 22:00:00 via INTRAVENOUS

## 2016-10-21 MED ORDER — VANCOMYCIN HCL IN DEXTROSE 1-5 GM/200ML-% IV SOLN
1000.0000 mg | Freq: Two times a day (BID) | INTRAVENOUS | Status: DC
  Administered 2016-10-21 – 2016-10-24 (×6): 1000 mg via INTRAVENOUS
  Filled 2016-10-21 (×9): qty 200

## 2016-10-21 MED ORDER — CEFTRIAXONE IV (FOR PTA / DISCHARGE USE ONLY): 2.0000 g | Freq: Two times a day (BID) | INTRAVENOUS | Status: DC

## 2016-10-21 MED ORDER — INSULIN ASPART 100 UNIT/ML ~~LOC~~ SOLN
0.0000 [IU] | Freq: Three times a day (TID) | SUBCUTANEOUS | Status: DC
  Administered 2016-10-23: 8 [IU] via SUBCUTANEOUS
  Administered 2016-10-23: 2 [IU] via SUBCUTANEOUS
  Administered 2016-10-23: 3 [IU] via SUBCUTANEOUS
  Administered 2016-10-24: 2 [IU] via SUBCUTANEOUS
  Administered 2016-10-24: 8 [IU] via SUBCUTANEOUS
  Administered 2016-10-24: 3 [IU] via SUBCUTANEOUS
  Administered 2016-10-25: 8 [IU] via SUBCUTANEOUS
  Administered 2016-10-25: 2 [IU] via SUBCUTANEOUS
  Administered 2016-10-25: 3 [IU] via SUBCUTANEOUS
  Administered 2016-10-26 (×2): 5 [IU] via SUBCUTANEOUS
  Administered 2016-10-27: 3 [IU] via SUBCUTANEOUS
  Administered 2016-10-27: 2 [IU] via SUBCUTANEOUS

## 2016-10-21 MED ORDER — ONDANSETRON HCL 4 MG/2ML IJ SOLN: 4.0000 mg | Freq: Four times a day (QID) | INTRAMUSCULAR | Status: DC | PRN

## 2016-10-21 MED ORDER — INSULIN GLARGINE 100 UNIT/ML ~~LOC~~ SOLN
25.0000 [IU] | Freq: Every day | SUBCUTANEOUS | Status: DC
  Administered 2016-10-21 – 2016-10-26 (×5): 25 [IU] via SUBCUTANEOUS
  Filled 2016-10-21 (×8): qty 0.25

## 2016-10-21 MED ORDER — TAMSULOSIN HCL 0.4 MG PO CAPS
0.4000 mg | ORAL_CAPSULE | Freq: Every day | ORAL | Status: DC
Start: 2016-10-22 — End: 2016-10-27
  Administered 2016-10-22 – 2016-10-27 (×6): 0.4 mg via ORAL
  Filled 2016-10-21 (×6): qty 1

## 2016-10-21 MED ORDER — ADULT MULTIVITAMIN W/MINERALS CH
1.0000 | ORAL_TABLET | Freq: Every day | ORAL | Status: DC
  Administered 2016-10-22 – 2016-10-27 (×6): 1 via ORAL
  Filled 2016-10-21 (×6): qty 1

## 2016-10-21 NOTE — ED Notes (Addendum)
Per pt and pt's family, pt returned from MRI eating graham crackers. When asked where he got the graham crackers pt was unclear but reports "someone gave them to me in that scan". York Cerise, [place] aware.

## 2016-10-21 NOTE — ED Notes (Signed)
ED Provider at bedside. 

## 2016-10-21 NOTE — ED Notes (Signed)
Pt not currently in room. Per Juli, RN pt in MRI at this time.

## 2016-10-21 NOTE — Progress Notes (Signed)
Patient transferred from ED

## 2016-10-21 NOTE — ED Notes (Signed)
Pt not curently in toom

## 2016-10-21 NOTE — ED Provider Notes (Signed)
[place] DEPT Provider Note   CSN: [id number] Arrival date & time: 10/21/16  1200     History   Chief Complaint Chief Complaint  Patient presents with  . Weakness    HPI Charles Parker is a 71 y.o. male.  The history is provided by the patient and medical records. No language interpreter was used.  Weakness  Primary symptoms include no dizziness. Pertinent negatives include no shortness of breath, no vomiting and no headaches.   Charles Parker is a 71 y.o. male  with an extensive medical history listed below presents to the Emergency Department from rehab for evaluation of low back pain and lower extremity weakness. Per facility, patient has been complaining of worsening lower abdominal pain and worsening low back pain for two-to-three days now. He had rehab today and did not want to participate. Per facility, patient's knees buckled today while they were trying to ambulate him. This has never happened before, therefore they sent him to ER for further evaluation of weakness. Patient endorses gradually worsening low back pain and abdominal pain since November. Pain is worse with certain movements. No fevers/chills. No n/v. No numbness or tingling.   Family now at bedside stating that they feel as if his conditioning has continued to decrease while in rehab.  Past Medical History:  Diagnosis Date  . Clotting disorder (HCC)    DVT  . Diabetes mellitus    type II  . Diverticulosis   . DVT (deep venous thrombosis) ([place])   . Glaucoma    no eye drops   . Hepatic cyst   . Hypercholesterolemia   . Hypertension   . Myocardial infarction    very mild long ago   . Renal cyst   . Sleep apnea   . Tubulovillous adenoma     Patient Active Problem List   Diagnosis Date Noted  . Osteomyelitis of thoracic vertebra ([place]) 10/21/2016  . Diabetes mellitus, with long-term current use of insulin ([place]) 10/21/2016  . Dyslipidemia associated with type 2 diabetes mellitus ([place])  10/21/2016  . Thoracic discitis 10/21/2016  . DVT, lower extremity, proximal ([place]) 07/10/2014  . Benign prostatic hyperplasia (BPH) with straining on urination 06/20/2013  . 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 12/27/2011    Past Surgical History:  Procedure Laterality Date  . BLADDER NECK RECONSTRUCTION  02/03/2012   Procedure: BLADDER NECK REPAIR;  Surgeon: Adin Hector, MD;  Location: [facility] ORS;  Service: General;;  . COLONOSCOPY  02/2013   polyp removal(mult.)  . KNEE SURGERY  2004   left  . POLYPECTOMY    . PROCTOSCOPY  02/03/2012   Procedure: PROCTOSCOPY;  Surgeon: Adin Hector, MD;  Location: [facility] ORS;  Service: General;;  . STOMACH SURGERY  02/2012  . TEE WITHOUT CARDIOVERSION N/A 10/05/2016   Procedure: TRANSESOPHAGEAL ECHOCARDIOGRAM (TEE);  Surgeon: Sanda Klein, MD;  Location: [facility] ENDOSCOPY;  Service: Cardiovascular;  Laterality: N/A;       Home Medications    Prior to Admission medications   Medication Sig Start Date End Date Taking? Authorizing Provider  cefTRIAXone (ROCEPHIN) IVPB Inject 2 g into the vein every 12 (twelve) hours. Indication:  Ecoli bacteremia with small epidural abscess/myositis Last Day of Therapy:  11/07/16 Labs - Once weekly:  CBC/D and BMP, Labs - Every other week:  ESR and CRP 10/05/16 11/08/16 Yes Shanker Kristeen Mans, MD  HYDROcodone-acetaminophen (NORCO/VICODIN) 5-325 MG tablet Take 1 tablet by mouth every  6 (six) hours as needed for moderate pain. 10/05/16  Yes Shanker Kristeen Mans, MD  Insulin Pen Needle 31G X 5 MM MISC Administer lantus once nightly 08/20/16  Yes Patrecia Pour, MD  methocarbamol (ROBAXIN) 500 MG tablet Take 1 tablet (500 mg total) by mouth every 8 (eight) hours as needed for muscle spasms. 10/05/16  Yes Shanker Kristeen Mans, MD  Multiple Vitamin (MULTIVITAMIN WITH MINERALS) TABS tablet Take 1 tablet by mouth daily.   Yes Historical Provider, MD  polyethylene glycol (MIRALAX /  GLYCOLAX) packet Take 17 g by mouth 2 (two) times daily. 10/05/16  Yes Shanker Kristeen Mans, MD  rivaroxaban (XARELTO) 20 MG TABS tablet Take 1 tablet (20 mg total) by mouth daily with supper. 07/30/16  Yes Janith Lima, MD  simvastatin (ZOCOR) 40 MG tablet TAKE 1 TABLET(40 MG) BY MOUTH EVERY EVENING 09/13/16  Yes Janith Lima, MD  tamsulosin (FLOMAX) 0.4 MG CAPS capsule Take 0.4 mg by mouth daily after breakfast.    Yes Historical Provider, MD    Family History Family History  Problem Relation Age of Onset  . Hypertension Mother   . Diabetes Mother   . Cancer Mother     ? location  . Colon cancer Father 1  . Colon polyps Neg Hx     Social History Social History  Substance Use Topics  . Smoking status: Never Smoker  . Smokeless tobacco: Never Used  . Alcohol use 0.0 oz/week     Comment: once every two months     Allergies   Metformin and related and Codeine   Review of Systems Review of Systems  Constitutional: Negative for chills and fever.  HENT: Negative for congestion.   Eyes: Negative for visual disturbance.  Respiratory: Negative for cough and shortness of breath.   Cardiovascular: Negative.   Gastrointestinal: Positive for abdominal pain. Negative for blood in stool, constipation, diarrhea, nausea and vomiting.  Genitourinary: Negative for difficulty urinating.  Musculoskeletal: Positive for back pain. Negative for neck pain.  Skin: Negative for rash and wound.  Neurological: Positive for weakness. Negative for dizziness, syncope, numbness and headaches.     Physical Exam Updated Vital Signs BP 134/82   Pulse 103   Temp 98.4 F (36.9 C) (Oral)   Resp 22   Ht 6' (1.829 m)   Wt 78.9 kg   SpO2 98%   BMI 23.60 kg/m   Physical Exam  Constitutional: He is oriented to person, place, and time. He appears well-developed and well-nourished. No distress.  HENT:  Head: Normocephalic and atraumatic.  Cardiovascular: Normal rate, regular rhythm and normal heart  sounds.   No murmur heard. Pulmonary/Chest: Effort normal and breath sounds normal. No respiratory distress. He has no wheezes. He has no rales.  Abdominal: Soft. He exhibits no distension.  Tenderness to palpation generally, but most significantly of the periumbilical and suprapubic area.   Musculoskeletal:  TTP of thoracic and lumbar spine. Patient with full ROM and full strength of upper extremities. Patient unable to lift bilateral lower extremities. BLE sensation and pulses intact.  Neurological: He is alert and oriented to person, place, and time.  Skin: Skin is warm and dry.  Nursing note and vitals reviewed.    ED Treatments / Results  Labs (all labs ordered are listed, but only abnormal results are displayed) Labs Reviewed  CBC WITH DIFFERENTIAL/PLATELET - Abnormal; Notable for the following:       Result Value   RBC 3.63 (*)  Hemoglobin 10.0 (*)    HCT 32.1 (*)    All other components within normal limits  COMPREHENSIVE METABOLIC PANEL - Abnormal; Notable for the following:    Glucose, Bld 113 (*)    Albumin 2.5 (*)    ALT 16 (*)    All other components within normal limits  URINALYSIS, ROUTINE W REFLEX MICROSCOPIC - Abnormal; Notable for the following:    APPearance TURBID (*)    Hgb urine dipstick MODERATE (*)    Bacteria, UA RARE (*)    All other components within normal limits  CULTURE, BLOOD (ROUTINE X 2)  CULTURE, BLOOD (ROUTINE X 2)  LIPASE, BLOOD    EKG  EKG Interpretation None       Radiology Mr Thoracic Spine W Wo Contrast  Result Date: 10/21/2016 CLINICAL DATA:  Diabetic patient with a recent history of discitis and epidural abscess. Increasing left worse than right lower extremity weakness. EXAM: MRI THORACIC AND LUMBAR SPINE WITHOUT AND WITH CONTRAST TECHNIQUE: Multiplanar and multiecho pulse sequences of the thoracic and lumbar spine were obtained without and with intravenous contrast. CONTRAST:  17 ml MULTIHANCE GADOBENATE DIMEGLUMINE 529  MG/ML IV SOLN COMPARISON:  MRI lumbar spine 09/30/2016. CT abdomen and pelvis this same day. FINDINGS: MRI THORACIC SPINE FINDINGS Alignment:  Maintained. Vertebrae: Endplate destructive change is seen about the T8-9 level with marked marrow edema and enhancement throughout both vertebral bodies consistent with discitis and osteomyelitis. Edema and enhancement are also present of the T8 and T9 facets and pedicles. Extensive paraspinous inflammatory change is present about the T8 and T9 vertebral bodies. Cord:  Demonstrates normal signal. Paraspinal and other soft tissues: Small bilateral pleural effusions are seen. Disc levels: T8-9: No epidural abscess is seen. There is extensive enhancement of epidural soft tissues and a disc bulge deforming the cord at T8-9. Rim enhancing fluid within the anterior aspect of the intervertebral disc space consistent with abscess measures 1.8 cm transverse by 0.9 cm AP by 0.9 cm craniocaudal. T5-6 and T6-7: Small left paracentral protrusions without central canal or foraminal stenosis. MRI LUMBAR SPINE FINDINGS Segmentation:  Standard. Alignment: Trace retrolisthesis L1 on L2 is unchanged. Otherwise maintained. Vertebrae: Heterogeneous marrow signal throughout is again seen. No fracture. Conus medullaris: Extends to the T12-L1 level and appears normal. Paraspinal and other soft tissues: Multiple bilateral renal cysts are again seen. Disc levels: L1-2: Trace retrolisthesis.  Central canal and foramina are open. L2-3:  Mild facet degenerative change.  Otherwise negative. L3-4: Mild to moderate facet arthropathy and is very small disc bulge. The central canal and foramina are open. L4-5: Advanced bilateral facet degenerative change is seen. Small epidural fluid collection subjacent to the ligamentum flavum on the right is no longer visualized. Fluid collection in the posterior paraspinous musculature on the left adjacent to the L4 spinous process has also resolved. There is marrow  edema and mild enhancement in the L4 and L5 pedicles and facets, not markedly changed. A new small fluid collection off the posterior aspect of the left facet joint measures 0.6 cm transverse by 0.9 cm AP by 0.7 cm craniocaudal. Posterior epidural soft tissues at this level are edematous and enhancing. L5-S1: Facet arthropathy. Small annular fissure and shallow bulge. The central canal and foramina are open. IMPRESSION: Findings consistent with discitis and osteomyelitis with endplate destructive change about the T8-9 interspace. Extensive paraspinous inflammatory change extends into the epidural space and there is a disc bulge at this level mildly deforming the cord. No epidural abscess is  identified. Rim enhancing fluid within the anterior aspect of the intervertebral disc space as described above is consistent with abscess. Two small fluid collections at L4-5 seen on the prior examination have resolved. However, there is a new small fluid collection posterior to the left facet joint worrisome for abscess as described above. Marrow edema and enhancement in the pedicles and facets bilaterally is most worrisome for septic facet joints but could be due to degenerative disease. The central canal is open at L4-5. Electronically Signed   By: Inge Rise M.D.   On: 10/21/2016 16:23   Mr Lumbar Spine W Wo Contrast  Result Date: 10/21/2016 CLINICAL DATA:  Diabetic patient with a recent history of discitis and epidural abscess. Increasing left worse than right lower extremity weakness. EXAM: MRI THORACIC AND LUMBAR SPINE WITHOUT AND WITH CONTRAST TECHNIQUE: Multiplanar and multiecho pulse sequences of the thoracic and lumbar spine were obtained without and with intravenous contrast. CONTRAST:  17 ml MULTIHANCE GADOBENATE DIMEGLUMINE 529 MG/ML IV SOLN COMPARISON:  MRI lumbar spine 09/30/2016. CT abdomen and pelvis this same day. FINDINGS: MRI THORACIC SPINE FINDINGS Alignment:  Maintained. Vertebrae: Endplate  destructive change is seen about the T8-9 level with marked marrow edema and enhancement throughout both vertebral bodies consistent with discitis and osteomyelitis. Edema and enhancement are also present of the T8 and T9 facets and pedicles. Extensive paraspinous inflammatory change is present about the T8 and T9 vertebral bodies. Cord:  Demonstrates normal signal. Paraspinal and other soft tissues: Small bilateral pleural effusions are seen. Disc levels: T8-9: No epidural abscess is seen. There is extensive enhancement of epidural soft tissues and a disc bulge deforming the cord at T8-9. Rim enhancing fluid within the anterior aspect of the intervertebral disc space consistent with abscess measures 1.8 cm transverse by 0.9 cm AP by 0.9 cm craniocaudal. T5-6 and T6-7: Small left paracentral protrusions without central canal or foraminal stenosis. MRI LUMBAR SPINE FINDINGS Segmentation:  Standard. Alignment: Trace retrolisthesis L1 on L2 is unchanged. Otherwise maintained. Vertebrae: Heterogeneous marrow signal throughout is again seen. No fracture. Conus medullaris: Extends to the T12-L1 level and appears normal. Paraspinal and other soft tissues: Multiple bilateral renal cysts are again seen. Disc levels: L1-2: Trace retrolisthesis.  Central canal and foramina are open. L2-3:  Mild facet degenerative change.  Otherwise negative. L3-4: Mild to moderate facet arthropathy and is very small disc bulge. The central canal and foramina are open. L4-5: Advanced bilateral facet degenerative change is seen. Small epidural fluid collection subjacent to the ligamentum flavum on the right is no longer visualized. Fluid collection in the posterior paraspinous musculature on the left adjacent to the L4 spinous process has also resolved. There is marrow edema and mild enhancement in the L4 and L5 pedicles and facets, not markedly changed. A new small fluid collection off the posterior aspect of the left facet joint measures 0.6  cm transverse by 0.9 cm AP by 0.7 cm craniocaudal. Posterior epidural soft tissues at this level are edematous and enhancing. L5-S1: Facet arthropathy. Small annular fissure and shallow bulge. The central canal and foramina are open. IMPRESSION: Findings consistent with discitis and osteomyelitis with endplate destructive change about the T8-9 interspace. Extensive paraspinous inflammatory change extends into the epidural space and there is a disc bulge at this level mildly deforming the cord. No epidural abscess is identified. Rim enhancing fluid within the anterior aspect of the intervertebral disc space as described above is consistent with abscess. Two small fluid collections at L4-5 seen on  the prior examination have resolved. However, there is a new small fluid collection posterior to the left facet joint worrisome for abscess as described above. Marrow edema and enhancement in the pedicles and facets bilaterally is most worrisome for septic facet joints but could be due to degenerative disease. The central canal is open at L4-5. Electronically Signed   By: Inge Rise M.D.   On: 10/21/2016 16:23   Ct Renal Stone Study  Addendum Date: 10/21/2016   ADDENDUM REPORT: 10/21/2016 13:58 ADDENDUM: The original report was by Dr. Van Clines. The following addendum is by Dr. Van Clines: These results were called by telephone at the time of interpretation on 10/21/2016 at 1:55 pm to Dr. Tanna Furry , who verbally acknowledged these results. Electronically Signed   By: Van Clines M.D.   On: 10/21/2016 13:58   Result Date: 10/21/2016 CLINICAL DATA:  Abdominal pain and low back pain over the last 2-3 weeks. EXAM: CT ABDOMEN AND PELVIS WITHOUT CONTRAST TECHNIQUE: Multidetector CT imaging of the abdomen and pelvis was performed following the standard protocol without IV contrast. COMPARISON:  None. FINDINGS: Lower chest: Atelectasis in the right middle lobe with subsegmental atelectasis in both  lower lobes. Paraspinal edema/ hematoma at the T8- 9 level, please see the musculoskeletal section below for further detail. Hepatobiliary: Small dependent densities in the gallbladder compatible with gallstones. Stable small hypodense 5 mm lesion in segment 3 of the liver. Pancreas: Unremarkable Spleen: Unremarkable Adrenals/Urinary Tract: Adrenal glands normal. Hypodense renal lesions are observed, most of fluid density but with a 5.2 by 3.7 cm mildly complex upper pole exophytic lesion with internal density 15 Hounsfield units. The other cysts are at about 2 Hounsfield units. Urinary bladder decompressed by a Foley catheter, no urinary tract stones identified. Stomach/Bowel: Appendix normal. Postoperative findings of the rectosigmoid junction. Prominent stool throughout the colon favors constipation. Several scattered descending colon and sigmoid diverticula. Vascular/Lymphatic: Mild aortoiliac atherosclerotic vascular disease. Reproductive: Unremarkable Other: No supplemental non-categorized findings. Musculoskeletal: There is an apparent fracture of the spinous process of T8 with scalloped irregular endplate destructive findings at the T8-9 level, compression fracture of T 8, posterior osseous ridging, and striking bilateral osseous foraminal stenosis at this level. I cannot exclude discitis-osteomyelitis and on MRI of the thoracic spine with attention to this region should be considered. There is considerable paraspinal edema/hematoma. There is arthropathy of the adjacent costovertebral joints. Bridging spurring of the left sacroiliac joint. Mild spurring of both hips. Lumbar degenerative disc disease and facet arthropathy causing impingement at L3-4, L4-5, and L5-S1. IMPRESSION: 1. Complex fracture at T8-9 with endplate destruction of both levels and possible discitis -osteomyelitis, with extensive paraspinal density potentially from hematoma or phlegmon. There is both posterior osseous ridging and foraminal  impingement at this level. I also suspect a fracture of the spinous process of T8. Given these findings, consider thoracic spine MRI with and without contrast for further characterization. 2. Scattered atelectasis in the lung bases. 3. Cholelithiasis. 4. Bilateral renal cysts. Mildly complex right kidney upper pole lesion is nonspecific but probably a complex cyst. 5.  Prominent stool throughout the colon favors constipation. 6. Lower lumbar impingement due to degenerative disc disease and facet arthropathy. Radiology assistant personnel have been notified to put me in telephone contact with the referring physician or the referring physician's clinical representative in order to discuss these findings. Once this communication is established I will issue an addendum to this report for documentation purposes. Electronically Signed: By: Van Clines M.D. On:  10/21/2016 13:52    Procedures Procedures (including critical care time)  Medications Ordered in ED Medications  vancomycin (VANCOCIN) IVPB 1000 mg/200 mL premix (not administered)  insulin aspart (novoLOG) injection 0-15 Units (not administered)  insulin glargine (LANTUS) injection 25 Units (not administered)  gadobenate dimeglumine (MULTIHANCE) injection 17 mL (17 mLs Intravenous Contrast Given 10/21/16 1539)  HYDROcodone-acetaminophen (NORCO/VICODIN) 5-325 MG per tablet 1 tablet (1 tablet Oral Given 10/21/16 1800)     Initial Impression / Assessment and Plan / ED Course  I have reviewed the triage vital signs and the nursing notes.  Pertinent labs & imaging results that were available during my care of the patient were reviewed by me and considered in my medical decision making (see chart for details).    Charles Parker is a 71 y.o. male who presents to ED from rehabilitation facility for progressively worsening low back and abdominal pain along with worsening bilateral lower extremity weakness. Patient does have history Ua negative  nitrites and leuks. White count normal at 6.5. CT study reviewed with attending. Impression: 1. Complex fracture at T8-9 with endplate destruction of both levels and possible discitis -osteomyelitis, with extensive paraspinal density potentially from hematoma or phlegmon. There is both posterior osseous ridging and foraminal impingement at this level. I also suspect a fracture of the spinous process of T8. Given these findings, consider thoracic spine MRI with and without contrast for further characterization. 2. Scattered atelectasis in the lung bases. 3. Cholelithiasis. 4. Bilateral renal cysts. Mildly complex right kidney upper pole lesion is nonspecific but probably a complex cyst. 5. Prominent stool throughout the colon favors constipation. 6. Lower lumbar impingement due to degenerative disc disease and facet arthropathy.  Will obtain MR of thoracic and lumbar spine.   MR results reviewed and concerning:  IMPRESSION: Findings consistent with discitis and osteomyelitis with endplate destructive change about the T8-9 interspace. Extensive paraspinous inflammatory change extends into the epidural space and there is a disc bulge at this level mildly deforming the cord. No epidural abscess is identified. Rim enhancing fluid within the anterior aspect of the intervertebral disc space as described above is consistent with abscess.  Two small fluid collections at L4-5 seen on the prior examination have resolved. However, there is a new small fluid collection posterior to the left facet joint worrisome for abscess as described above. Marrow edema and enhancement in the pedicles and facets bilaterally is most worrisome for septic facet joints but could be due to degenerative disease. The central canal is open at L4-5.  Neurosurgery, Dr. Christella Noa, consulted who states this is non-surgical and recommending medical admission. He will come evaluate the patient.   Hospitalist consulted who  will admit. Will obtain blood cultures then start vanc. Patient informed of treatment plan and all questions answered.   Patient seen by and discussed with Dr. Jeneen Rinks who agrees with treatment plan.    Final Clinical Impressions(s) / ED Diagnoses   Final diagnoses:  Leg weakness  Abnormal CT scan    New Prescriptions New Prescriptions   No medications on file     [facility] Luisenrique Conran, PA-C 10/21/16 [place], MD 10/29/16 1432

## 2016-10-21 NOTE — ED Triage Notes (Signed)
Was in hospital for approx one week because he could not stand, walk.  Went to rehab; sent here for further evalulation because he can't walk.

## 2016-10-21 NOTE — Progress Notes (Signed)
Pharmacy Antibiotic Note  BARDO GIUSTINO is a 71 y.o. male admitted on 10/21/2016 with osteomyelitis.  Pharmacy has been consulted for Vancomycin dosing.  Plan: Vancomycin 1 gram iv Q 12 hours for now Follow Scr, cultures, progress  Height: 6' (182.9 cm) Weight: 174 lb (78.9 kg) IBW/kg (Calculated) : 77.6  Temp (24hrs), Avg:98.4 F (36.9 C), Min:98.4 F (36.9 C), Max:98.4 F (36.9 C)   Recent Labs Lab 10/21/16 1258  WBC 6.5  CREATININE 0.76    Estimated Creatinine Clearance: 94.3 mL/min (by C-G formula based on SCr of 0.76 mg/dL).    Allergies  Allergen Reactions  . Metformin And Related Diarrhea  . Codeine Rash    All over the body     Thank you for allowing pharmacy to be a part of this patient's care. Anette Guarneri, PharmD [phone] 10/21/2016 6:04 PM

## 2016-10-21 NOTE — ED Notes (Signed)
Patient bladder scanned for 186 mL

## 2016-10-21 NOTE — Patient Outreach (Signed)
[facility] [facility]) Care Management  10/21/2016  OBRYANT BOREY 12-06-1945 [id number]   Assessment- CSW completed call to [facility] but was unable to reach social worker. CSW left a HIPPA compliant voice message requesting return call in regards to updates on patient's expected discharge date.  Plan-CSW will await to hear back from SNF or complete an additional outreach within one week.  [name], BSW, MSW, [email or web address] Phone: [phone] Fax: [phone]

## 2016-10-21 NOTE — ED Notes (Signed)
Called lab to add on A1c

## 2016-10-21 NOTE — Consult Note (Signed)
Reason for Consult:thoracic osteomyelitis, discitis Referring Physician: ED  Charles Parker is an 71 y.o. male.  HPI: with extensive history of infection, and back pain. Treated and seen multiple times both in the ED and Hospital. Admitted most recently 12/28-1/02 and placed on IV antibiotics. Has been progressively weaker and not been able to participate with PT at his rehab facility. Diabetes is not controlled, history of DVT also. Sent to the ED by his facility physician due to weakness in lower extremities. Charles Parker states that his legs have been very weak over the last week.   Past Medical History:  Diagnosis Date  . Clotting disorder (HCC)    DVT  . Diabetes mellitus    type II  . Diverticulosis   . DVT (deep venous thrombosis) ([place])   . Glaucoma    no eye drops   . Hepatic cyst   . Hypercholesterolemia   . Hypertension   . Myocardial infarction    very mild long ago   . Renal cyst   . Sleep apnea   . Tubulovillous adenoma     Past Surgical History:  Procedure Laterality Date  . BLADDER NECK RECONSTRUCTION  02/03/2012   Procedure: BLADDER NECK REPAIR;  Surgeon: Adin Hector, MD;  Location: [facility];  Service: General;;  . COLONOSCOPY  02/2013   polyp removal(mult.)  . KNEE SURGERY  2004   left  . POLYPECTOMY    . PROCTOSCOPY  02/03/2012   Procedure: PROCTOSCOPY;  Surgeon: Adin Hector, MD;  Location: [facility];  Service: General;;  . STOMACH SURGERY  02/2012  . TEE WITHOUT CARDIOVERSION N/A 10/05/2016   Procedure: TRANSESOPHAGEAL ECHOCARDIOGRAM (TEE);  Surgeon: Sanda Klein, MD;  Location: [facility] ENDOSCOPY;  Service: Cardiovascular;  Laterality: N/A;    Family History  Problem Relation Age of Onset  . Hypertension Mother   . Diabetes Mother   . Cancer Mother     ? location  . Colon cancer Father 15  . Colon polyps Neg Hx     Social History:  reports that he has never smoked. He has never used smokeless tobacco. He reports that he drinks alcohol. He reports that  he does not use drugs.  Allergies:  Allergies  Allergen Reactions  . Metformin And Related Diarrhea  . Codeine Rash    All over the body    Medications: I have reviewed the patient's current medications.  Results for orders placed or performed during the hospital encounter of 10/21/16 (from the past 48 hour(s))  CBC with Differential     Status: Abnormal   Collection Time: 10/21/16 12:58 PM  Result Value Ref Range   WBC 6.5 4.0 - 10.5 K/uL   RBC 3.63 (L) 4.22 - 5.81 MIL/uL   Hemoglobin 10.0 (L) 13.0 - 17.0 g/dL   HCT 32.1 (L) 39.0 - 52.0 %   MCV 88.4 78.0 - 100.0 fL   MCH 27.5 26.0 - 34.0 pg   MCHC 31.2 30.0 - 36.0 g/dL   RDW 14.3 11.5 - 15.5 %   Platelets 236 150 - 400 K/uL   Neutrophils Relative % 70 %   Neutro Abs 4.6 1.7 - 7.7 K/uL   Lymphocytes Relative 19 %   Lymphs Abs 1.2 0.7 - 4.0 K/uL   Monocytes Relative 8 %   Monocytes Absolute 0.5 0.1 - 1.0 K/uL   Eosinophils Relative 2 %   Eosinophils Absolute 0.1 0.0 - 0.7 K/uL   Basophils Relative 1 %   Basophils Absolute  0.0 0.0 - 0.1 K/uL  Comprehensive metabolic panel     Status: Abnormal   Collection Time: 10/21/16 12:58 PM  Result Value Ref Range   Sodium 138 135 - 145 mmol/L   Potassium 3.7 3.5 - 5.1 mmol/L   Chloride 103 101 - 111 mmol/L   CO2 26 22 - 32 mmol/L   Glucose, Bld 113 (H) 65 - 99 mg/dL   BUN 11 6 - 20 mg/dL   Creatinine, Ser 0.76 0.61 - 1.24 mg/dL   Calcium 9.1 8.9 - 10.3 mg/dL   Total Protein 7.4 6.5 - 8.1 g/dL   Albumin 2.5 (L) 3.5 - 5.0 g/dL   AST 22 15 - 41 U/L   ALT 16 (L) 17 - 63 U/L   Alkaline Phosphatase 56 38 - 126 U/L   Total Bilirubin 0.3 0.3 - 1.2 mg/dL   GFR calc non Af Amer >60 >60 mL/min   GFR calc Af Amer >60 >60 mL/min    Comment: (NOTE) The eGFR has been calculated using the CKD EPI equation. This calculation has not been validated in all clinical situations. eGFR's persistently <60 mL/min signify possible Chronic Kidney Disease.    Anion gap 9 5 - 15  Lipase, blood      Status: None   Collection Time: 10/21/16 12:58 PM  Result Value Ref Range   Lipase 15 11 - 51 U/L  Urinalysis, Routine w reflex microscopic     Status: Abnormal   Collection Time: 10/21/16  1:12 PM  Result Value Ref Range   Color, Urine YELLOW YELLOW   APPearance TURBID (A) CLEAR   Specific Gravity, Urine 1.015 1.005 - 1.030   pH 8.0 5.0 - 8.0   Glucose, UA NEGATIVE NEGATIVE mg/dL   Hgb urine dipstick MODERATE (A) NEGATIVE   Bilirubin Urine NEGATIVE NEGATIVE   Ketones, ur NEGATIVE NEGATIVE mg/dL   Protein, ur NEGATIVE NEGATIVE mg/dL   Nitrite NEGATIVE NEGATIVE   Leukocytes, UA NEGATIVE NEGATIVE   RBC / HPF TOO NUMEROUS TO COUNT 0 - 5 RBC/hpf   WBC, UA NONE SEEN 0 - 5 WBC/hpf   Bacteria, UA RARE (A) NONE SEEN   Squamous Epithelial / LPF NONE SEEN NONE SEEN   Budding Yeast PRESENT    Amorphous Crystal PRESENT     Mr Thoracic Spine W Wo Contrast  Result Date: 10/21/2016 CLINICAL DATA:  Diabetic patient with a recent history of discitis and epidural abscess. Increasing left worse than right lower extremity weakness. EXAM: MRI THORACIC AND LUMBAR SPINE WITHOUT AND WITH CONTRAST TECHNIQUE: Multiplanar and multiecho pulse sequences of the thoracic and lumbar spine were obtained without and with intravenous contrast. CONTRAST:  17 ml MULTIHANCE GADOBENATE DIMEGLUMINE 529 MG/ML IV SOLN COMPARISON:  MRI lumbar spine 09/30/2016. CT abdomen and pelvis this same day. FINDINGS: MRI THORACIC SPINE FINDINGS Alignment:  Maintained. Vertebrae: Endplate destructive change is seen about the T8-9 level with marked marrow edema and enhancement throughout both vertebral bodies consistent with discitis and osteomyelitis. Edema and enhancement are also present of the T8 and T9 facets and pedicles. Extensive paraspinous inflammatory change is present about the T8 and T9 vertebral bodies. Cord:  Demonstrates normal signal. Paraspinal and other soft tissues: Small bilateral pleural effusions are seen. Disc levels:  T8-9: No epidural abscess is seen. There is extensive enhancement of epidural soft tissues and a disc bulge deforming the cord at T8-9. Rim enhancing fluid within the anterior aspect of the intervertebral disc space consistent with abscess measures 1.8 cm transverse  by 0.9 cm AP by 0.9 cm craniocaudal. T5-6 and T6-7: Small left paracentral protrusions without central canal or foraminal stenosis. MRI LUMBAR SPINE FINDINGS Segmentation:  Standard. Alignment: Trace retrolisthesis L1 on L2 is unchanged. Otherwise maintained. Vertebrae: Heterogeneous marrow signal throughout is again seen. No fracture. Conus medullaris: Extends to the T12-L1 level and appears normal. Paraspinal and other soft tissues: Multiple bilateral renal cysts are again seen. Disc levels: L1-2: Trace retrolisthesis.  Central canal and foramina are open. L2-3:  Mild facet degenerative change.  Otherwise negative. L3-4: Mild to moderate facet arthropathy and is very small disc bulge. The central canal and foramina are open. L4-5: Advanced bilateral facet degenerative change is seen. Small epidural fluid collection subjacent to the ligamentum flavum on the right is no longer visualized. Fluid collection in the posterior paraspinous musculature on the left adjacent to the L4 spinous process has also resolved. There is marrow edema and mild enhancement in the L4 and L5 pedicles and facets, not markedly changed. A new small fluid collection off the posterior aspect of the left facet joint measures 0.6 cm transverse by 0.9 cm AP by 0.7 cm craniocaudal. Posterior epidural soft tissues at this level are edematous and enhancing. L5-S1: Facet arthropathy. Small annular fissure and shallow bulge. The central canal and foramina are open. IMPRESSION: Findings consistent with discitis and osteomyelitis with endplate destructive change about the T8-9 interspace. Extensive paraspinous inflammatory change extends into the epidural space and there is a disc bulge at  this level mildly deforming the cord. No epidural abscess is identified. Rim enhancing fluid within the anterior aspect of the intervertebral disc space as described above is consistent with abscess. Two small fluid collections at L4-5 seen on the prior examination have resolved. However, there is a new small fluid collection posterior to the left facet joint worrisome for abscess as described above. Marrow edema and enhancement in the pedicles and facets bilaterally is most worrisome for septic facet joints but could be due to degenerative disease. The central canal is open at L4-5. Electronically Signed   By: Inge Rise M.D.   On: 10/21/2016 16:23   Mr Lumbar Spine W Wo Contrast  Result Date: 10/21/2016 CLINICAL DATA:  Diabetic patient with a recent history of discitis and epidural abscess. Increasing left worse than right lower extremity weakness. EXAM: MRI THORACIC AND LUMBAR SPINE WITHOUT AND WITH CONTRAST TECHNIQUE: Multiplanar and multiecho pulse sequences of the thoracic and lumbar spine were obtained without and with intravenous contrast. CONTRAST:  17 ml MULTIHANCE GADOBENATE DIMEGLUMINE 529 MG/ML IV SOLN COMPARISON:  MRI lumbar spine 09/30/2016. CT abdomen and pelvis this same day. FINDINGS: MRI THORACIC SPINE FINDINGS Alignment:  Maintained. Vertebrae: Endplate destructive change is seen about the T8-9 level with marked marrow edema and enhancement throughout both vertebral bodies consistent with discitis and osteomyelitis. Edema and enhancement are also present of the T8 and T9 facets and pedicles. Extensive paraspinous inflammatory change is present about the T8 and T9 vertebral bodies. Cord:  Demonstrates normal signal. Paraspinal and other soft tissues: Small bilateral pleural effusions are seen. Disc levels: T8-9: No epidural abscess is seen. There is extensive enhancement of epidural soft tissues and a disc bulge deforming the cord at T8-9. Rim enhancing fluid within the anterior aspect  of the intervertebral disc space consistent with abscess measures 1.8 cm transverse by 0.9 cm AP by 0.9 cm craniocaudal. T5-6 and T6-7: Small left paracentral protrusions without central canal or foraminal stenosis. MRI LUMBAR SPINE FINDINGS Segmentation:  Standard.  Alignment: Trace retrolisthesis L1 on L2 is unchanged. Otherwise maintained. Vertebrae: Heterogeneous marrow signal throughout is again seen. No fracture. Conus medullaris: Extends to the T12-L1 level and appears normal. Paraspinal and other soft tissues: Multiple bilateral renal cysts are again seen. Disc levels: L1-2: Trace retrolisthesis.  Central canal and foramina are open. L2-3:  Mild facet degenerative change.  Otherwise negative. L3-4: Mild to moderate facet arthropathy and is very small disc bulge. The central canal and foramina are open. L4-5: Advanced bilateral facet degenerative change is seen. Small epidural fluid collection subjacent to the ligamentum flavum on the right is no longer visualized. Fluid collection in the posterior paraspinous musculature on the left adjacent to the L4 spinous process has also resolved. There is marrow edema and mild enhancement in the L4 and L5 pedicles and facets, not markedly changed. A new small fluid collection off the posterior aspect of the left facet joint measures 0.6 cm transverse by 0.9 cm AP by 0.7 cm craniocaudal. Posterior epidural soft tissues at this level are edematous and enhancing. L5-S1: Facet arthropathy. Small annular fissure and shallow bulge. The central canal and foramina are open. IMPRESSION: Findings consistent with discitis and osteomyelitis with endplate destructive change about the T8-9 interspace. Extensive paraspinous inflammatory change extends into the epidural space and there is a disc bulge at this level mildly deforming the cord. No epidural abscess is identified. Rim enhancing fluid within the anterior aspect of the intervertebral disc space as described above is  consistent with abscess. Two small fluid collections at L4-5 seen on the prior examination have resolved. However, there is a new small fluid collection posterior to the left facet joint worrisome for abscess as described above. Marrow edema and enhancement in the pedicles and facets bilaterally is most worrisome for septic facet joints but could be due to degenerative disease. The central canal is open at L4-5. Electronically Signed   By: Inge Rise M.D.   On: 10/21/2016 16:23   Ct Renal Stone Study  Addendum Date: 10/21/2016   ADDENDUM REPORT: 10/21/2016 13:58 ADDENDUM: The original report was by Dr. Van Clines. The following addendum is by Dr. Van Clines: These results were called by telephone at the time of interpretation on 10/21/2016 at 1:55 pm to Dr. Tanna Furry , who verbally acknowledged these results. Electronically Signed   By: Van Clines M.D.   On: 10/21/2016 13:58   Result Date: 10/21/2016 CLINICAL DATA:  Abdominal pain and low back pain over the last 2-3 weeks. EXAM: CT ABDOMEN AND PELVIS WITHOUT CONTRAST TECHNIQUE: Multidetector CT imaging of the abdomen and pelvis was performed following the standard protocol without IV contrast. COMPARISON:  None. FINDINGS: Lower chest: Atelectasis in the right middle lobe with subsegmental atelectasis in both lower lobes. Paraspinal edema/ hematoma at the T8- 9 level, please see the musculoskeletal section below for further detail. Hepatobiliary: Small dependent densities in the gallbladder compatible with gallstones. Stable small hypodense 5 mm lesion in segment 3 of the liver. Pancreas: Unremarkable Spleen: Unremarkable Adrenals/Urinary Tract: Adrenal glands normal. Hypodense renal lesions are observed, most of fluid density but with a 5.2 by 3.7 cm mildly complex upper pole exophytic lesion with internal density 15 Hounsfield units. The other cysts are at about 2 Hounsfield units. Urinary bladder decompressed by a Foley catheter,  no urinary tract stones identified. Stomach/Bowel: Appendix normal. Postoperative findings of the rectosigmoid junction. Prominent stool throughout the colon favors constipation. Several scattered descending colon and sigmoid diverticula. Vascular/Lymphatic: Mild aortoiliac atherosclerotic vascular disease. Reproductive:  Unremarkable Other: No supplemental non-categorized findings. Musculoskeletal: There is an apparent fracture of the spinous process of T8 with scalloped irregular endplate destructive findings at the T8-9 level, compression fracture of T 8, posterior osseous ridging, and striking bilateral osseous foraminal stenosis at this level. I cannot exclude discitis-osteomyelitis and on MRI of the thoracic spine with attention to this region should be considered. There is considerable paraspinal edema/hematoma. There is arthropathy of the adjacent costovertebral joints. Bridging spurring of the left sacroiliac joint. Mild spurring of both hips. Lumbar degenerative disc disease and facet arthropathy causing impingement at L3-4, L4-5, and L5-S1. IMPRESSION: 1. Complex fracture at T8-9 with endplate destruction of both levels and possible discitis -osteomyelitis, with extensive paraspinal density potentially from hematoma or phlegmon. There is both posterior osseous ridging and foraminal impingement at this level. I also suspect a fracture of the spinous process of T8. Given these findings, consider thoracic spine MRI with and without contrast for further characterization. 2. Scattered atelectasis in the lung bases. 3. Cholelithiasis. 4. Bilateral renal cysts. Mildly complex right kidney upper pole lesion is nonspecific but probably a complex cyst. 5.  Prominent stool throughout the colon favors constipation. 6. Lower lumbar impingement due to degenerative disc disease and facet arthropathy. Radiology assistant personnel have been notified to put me in telephone contact with the referring physician or the  referring physician's clinical representative in order to discuss these findings. Once this communication is established I will issue an addendum to this report for documentation purposes. Electronically Signed: By: Van Clines M.D. On: 10/21/2016 13:52    Review of Systems  Constitutional: Positive for chills and fever.  HENT: Negative.   Eyes: Negative.   Respiratory: Negative.   Cardiovascular: Negative.   Gastrointestinal: Negative.   Genitourinary: Positive for flank pain.  Musculoskeletal: Positive for back pain.  Skin: Negative.   Neurological: Positive for sensory change, focal weakness and weakness.  Psychiatric/Behavioral: Negative.    Blood pressure 134/82, pulse 103, temperature 98.4 F (36.9 C), temperature source Oral, resp. rate 22, height 6' (1.829 m), weight 78.9 kg (174 lb), SpO2 98 %. Physical Exam  Constitutional: He is oriented to person, place, and time. He appears well-developed and well-nourished. No distress.  HENT:  Head: Normocephalic and atraumatic.  Eyes: Conjunctivae and EOM are normal. Pupils are equal, round, and reactive to light.  Neck: Normal range of motion. Neck supple.  Cardiovascular: Normal rate, regular rhythm and normal heart sounds.   Respiratory: Effort normal and breath sounds normal.  GI: Soft.  Neurological: He is alert and oriented to person, place, and time. No cranial nerve deficit or sensory deficit. Coordination abnormal. He displays no Babinski's sign on the right side. He displays no Babinski's sign on the left side.  Weakness lower extremities Right side Hams 3, quads 3, Hf,3, He 3, dorsiflexors1, plantar flexors 2, adductors 4-, abductors 4 Left Hams 2, quads 2, pf 1, df 1, hf 3, he 2, Gait not assessed  Skin: He is not diaphoretic.    Assessment/Plan: Possible OR tomorrow for both diagnosis, and decompression. This gentleman has had objective evidence of bony destruction at least since12/24/2017. Chest xray on   11/14 is last normal view of Thoracic spine. Although attention has been on the lumbar region, this was not the cause of his lower extremity weakness, nor was the reason for the continued pain in the upper back and most likely flank. There is mild cord compression at the T8/9 space, but with some available space posterior  to the cord. He has been receiving antibiotics since 12/1 approximately. His disease has progressed as evidenced by radiology. Given weakness, non response to antibiotics, possible operative decompression may be indicated, with debridement, and instrumentation. Will speak with Mr. Walmer and his brothers tomorrow.   Tishana Clinkenbeard L 10/21/2016, 6:52 PM

## 2016-10-21 NOTE — ED Notes (Signed)
Neuro surgery at bedside.

## 2016-10-21 NOTE — H&P (Signed)
History and Physical    Charles Parker:[id number] DOB: 1946-02-04 DOA: 10/21/2016  Referring MD/NP/PA: Pearlie Oyster, PA  PCP: Scarlette Calico, MD   Outpatient Specialists: GI, Dr. Sheela Stack   Patient coming from: home   Chief Complaint: weakness   HPI: Charles Parker is a 71 y.o. male with medical history significant for recent hospitalization 09/26/16 - 10/05/16 for sepsis due to E.Coli bacteremia and pyelonephritis as well as L4, L5 epidural abscess with osteomyelitis and phlegmon and was supposed to be on rocephin through 11/07/16. TEE was done 10/05/2016 and was negative for endocarditis. This time pt presented with progressive LE weakness but no fevers, no specific urinary concerns, no chest pain, no dyspnea, no abd concerns.   ED Course: In ED, BP was 124/99, HR 95, RR 13-32, afebrile and oxygen saturation 94% on room air. Blood work was notable for hemoglobin of 10 otherwise unremarkable. CT renal stone protocol showed complex fracture at T8-9 with endplate destruction of both levels and possible discitis / osteomyelitis, with extensive paraspinal density potentially from hematoma or phlegmon. There is both posterior osseous ridging and foraminal impingement at this level and suspect a fracture of the spinous process of T8. MRI thoracic and lumbar spine showed Findings consistent with discitis and osteomyelitis with endplate destructive change about the T8-9 interspace. Extensive paraspinous inflammatory change extends into the epidural space and there is a disc bulge at this level mildly deforming the cord. No epidural abscess identified. Rim enhancing fluid within the anterior aspect of the intervertebral disc space is consistent with abscess. Also, two small fluid collections at L4-5 seen on the prior examination have resolved. However, there is a new small fluid collection posterior to the left facet joint worrisome for abscess, marrow edema and enhancement in the pedicles and facets  bilaterally is most worrisome for septic facet joints but could be due to degenerative disease. Pt already on rocephin and vanco added in ED. Neurosurgery was consulted and [facility] asked to admit for further evaluation.   Review of Systems:  Constitutional: Negative for appetite change and fatigue.  HENT: Negative for ear pain, nosebleeds, congestion, facial swelling, rhinorrhea, neck pain, neck stiffness and ear discharge.   Eyes: Negative for pain, discharge, redness, itching and visual disturbance.  Respiratory: Negative for cough, choking, chest tightness, shortness of breath, wheezing and stridor.   Cardiovascular: Negative for chest pain, palpitations and leg swelling.  Gastrointestinal: Negative for abdominal distention.  Genitourinary: Negative for dysuria, urgency, frequency, hematuria, flank pain, decreased urine volume Musculoskeletal: Negative for falls  Neurological: Negative for dizziness, tremors, seizures, syncope, facial asymmetry, speech difficulty, weakness, light-headedness, numbness and headaches.  Hematological: Negative for adenopathy. Does not bruise/bleed easily.  Psychiatric/Behavioral: Negative for hallucinations, behavioral problems, confusion, dysphoric mood  Past Medical History:  Diagnosis Date  . Clotting disorder (HCC)    DVT  . Diabetes mellitus    type II  . Diverticulosis   . DVT (deep venous thrombosis) ([place])   . Glaucoma    no eye drops   . Hepatic cyst   . Hypercholesterolemia   . Hypertension   . Myocardial infarction    very mild long ago   . Renal cyst   . Sleep apnea   . Tubulovillous adenoma     Past Surgical History:  Procedure Laterality Date  . BLADDER NECK RECONSTRUCTION  02/03/2012   Procedure: BLADDER NECK REPAIR;  Surgeon: Adin Hector, MD;  Location: [facility] ORS;  Service: General;;  . COLONOSCOPY  02/2013  polyp removal(mult.)  . KNEE SURGERY  2004   left  . POLYPECTOMY    . PROCTOSCOPY  02/03/2012   Procedure: PROCTOSCOPY;   Surgeon: Adin Hector, MD;  Location: [facility];  Service: General;;  . STOMACH SURGERY  02/2012  . TEE WITHOUT CARDIOVERSION N/A 10/05/2016   Procedure: TRANSESOPHAGEAL ECHOCARDIOGRAM (TEE);  Surgeon: Sanda Klein, MD;  Location: [facility] ENDOSCOPY;  Service: Cardiovascular;  Laterality: N/A;   Social Hx:  reports that he has never smoked. He has never used smokeless tobacco. He reports that he drinks alcohol. He reports that he does not use drugs.  Allergies  Allergen Reactions  . Metformin And Related Diarrhea  . Codeine Rash    All over the body    Family History  Problem Relation Age of Onset  . Hypertension Mother   . Diabetes Mother   . Cancer Mother     ? location  . Colon cancer Father 16  . Colon polyps Neg Hx     Prior to Admission medications   Medication Sig Start Date End Date Taking? Authorizing Provider  cefTRIAXone (ROCEPHIN) IVPB Inject 2 g into the vein every 12 (twelve) hours. Indication:  Ecoli bacteremia with small epidural abscess/myositis Last Day of Therapy:  11/07/16 Labs - Once weekly:  CBC/D and BMP, Labs - Every other week:  ESR and CRP 10/05/16 11/08/16 Yes Shanker Kristeen Mans, MD  HYDROcodone-acetaminophen (NORCO/VICODIN) 5-325 MG tablet Take 1 tablet by mouth every 6 (six) hours as needed for moderate pain. 10/05/16  Yes Shanker Kristeen Mans, MD  Insulin Pen Needle 31G X 5 MM MISC Administer lantus once nightly 08/20/16  Yes Patrecia Pour, MD  methocarbamol (ROBAXIN) 500 MG tablet Take 1 tablet (500 mg total) by mouth every 8 (eight) hours as needed for muscle spasms. 10/05/16  Yes Shanker Kristeen Mans, MD  Multiple Vitamin (MULTIVITAMIN WITH MINERALS) TABS tablet Take 1 tablet by mouth daily.   Yes Historical Provider, MD  polyethylene glycol (MIRALAX / GLYCOLAX) packet Take 17 g by mouth 2 (two) times daily. 10/05/16  Yes Shanker Kristeen Mans, MD  rivaroxaban (XARELTO) 20 MG TABS tablet Take 1 tablet (20 mg total) by mouth daily with supper. 07/30/16  Yes Janith Lima, MD    simvastatin (ZOCOR) 40 MG tablet TAKE 1 TABLET(40 MG) BY MOUTH EVERY EVENING 09/13/16  Yes Janith Lima, MD  tamsulosin (FLOMAX) 0.4 MG CAPS capsule Take 0.4 mg by mouth daily after breakfast.    Yes Historical Provider, MD    Physical Exam: Vitals:   10/21/16 1645 10/21/16 1700 10/21/16 1715 10/21/16 1730  BP: 127/71 129/76 138/89 134/82  Pulse: 106 107 105 103  Resp: '23 17 13 22  ' Temp:      TempSrc:      SpO2: 96% 97% 97% 98%  Weight:      Height:        Constitutional: NAD, calm, comfortable Vitals:   10/21/16 1645 10/21/16 1700 10/21/16 1715 10/21/16 1730  BP: 127/71 129/76 138/89 134/82  Pulse: 106 107 105 103  Resp: '23 17 13 22  ' Temp:      TempSrc:      SpO2: 96% 97% 97% 98%  Weight:      Height:       Eyes: PERRL, lids and conjunctivae normal ENMT: Mucous membranes are moist. Posterior pharynx clear of any exudate or lesions.Normal dentition.  Neck: normal, supple, no masses, no thyromegaly Respiratory: clear to auscultation bilaterally, no wheezing, no crackles. Normal  respiratory effort. No accessory muscle use.  Cardiovascular: Regular rate and rhythm, no murmurs / rubs / gallops. No extremity edema. 2+ pedal pulses. No carotid bruits.  Abdomen: no tenderness, no masses palpated. No hepatosplenomegaly. Bowel sounds positive.  Musculoskeletal: no clubbing / cyanosis. No joint deformity upper and lower extremities.  Skin: no rashes, lesions, ulcers. No induration Neurologic: CN 2-12 grossly intact. Strength 2-3 in bilateral LE's Psychiatric: Normal judgment and insight. Alert and oriented x 3. Normal mood.   Labs on Admission: I have personally reviewed following labs and imaging studies  CBC:  Recent Labs Lab 10/21/16 1258  WBC 6.5  NEUTROABS 4.6  HGB 10.0*  HCT 32.1*  MCV 88.4  PLT 735   Basic Metabolic Panel:  Recent Labs Lab 10/21/16 1258  NA 138  K 3.7  CL 103  CO2 26  GLUCOSE 113*  BUN 11  CREATININE 0.76  CALCIUM 9.1    GFR: Estimated Creatinine Clearance: 94.3 mL/min (by C-G formula based on SCr of 0.76 mg/dL). Liver Function Tests:  Recent Labs Lab 10/21/16 1258  AST 22  ALT 16*  ALKPHOS 56  BILITOT 0.3  PROT 7.4  ALBUMIN 2.5*    Recent Labs Lab 10/21/16 1258  LIPASE 15   Urine analysis:    Component Value Date/Time   COLORURINE YELLOW 10/21/2016 1312   APPEARANCEUR TURBID (A) 10/21/2016 1312   LABSPEC 1.015 10/21/2016 1312   PHURINE 8.0 10/21/2016 1312   GLUCOSEU NEGATIVE 10/21/2016 1312   GLUCOSEU NEGATIVE 09/08/2016 1513   HGBUR MODERATE (A) 10/21/2016 1312   BILIRUBINUR NEGATIVE 10/21/2016 1312   BILIRUBINUR negative 08/17/2016 [place] 10/21/2016 1312   PROTEINUR NEGATIVE 10/21/2016 1312   UROBILINOGEN 0.2 09/08/2016 1513   NITRITE NEGATIVE 10/21/2016 1312   LEUKOCYTESUR NEGATIVE 10/21/2016 1312   Radiological Exams on Admission: Mr Thoracic Spine W Wo Contrast Result Date: 10/21/2016 Findings consistent with discitis and osteomyelitis with endplate destructive change about the T8-9 interspace. Extensive paraspinous inflammatory change extends into the epidural space and there is a disc bulge at this level mildly deforming the cord. No epidural abscess is identified. Rim enhancing fluid within the anterior aspect of the intervertebral disc space as described above is consistent with abscess. Two small fluid collections at L4-5 seen on the prior examination have resolved. However, there is a new small fluid collection posterior to the left facet joint worrisome for abscess as described above. Marrow edema and enhancement in the pedicles and facets bilaterally is most worrisome for septic facet joints but could be due to degenerative disease. The central canal is open at L4-5. Electronically Signed   By: Inge Rise M.D.   On: 10/21/2016 16:23   Mr Lumbar Spine W Wo Contrast Result Date: 10/21/2016 Findings consistent with discitis and osteomyelitis with  endplate destructive change about the T8-9 interspace. Extensive paraspinous inflammatory change extends into the epidural space and there is a disc bulge at this level mildly deforming the cord. No epidural abscess is identified. Rim enhancing fluid within the anterior aspect of the intervertebral disc space as described above is consistent with abscess. Two small fluid collections at L4-5 seen on the prior examination have resolved. However, there is a new small fluid collection posterior to the left facet joint worrisome for abscess as described above. Marrow edema and enhancement in the pedicles and facets bilaterally is most worrisome for septic facet joints but could be due to degenerative disease. The central canal is open at L4-5. Electronically Signed  By: Inge Rise M.D.   On: 10/21/2016 16:23   Ct Renal Stone Study Addendum Date: 10/21/2016   ADDENDUM REPORT: 10/21/2016 13:58 ADDENDUM: The original report was by Dr. Van Clines. The following addendum is by Dr. Van Clines: These results were called by telephone at the time of interpretation on 10/21/2016 at 1:55 pm to Dr. Tanna Furry , who verbally acknowledged these results. Electronically Signed   By: Van Clines M.D.   On: 10/21/2016 13:58  Result Date: 10/21/2016 1. Complex fracture at T8-9 with endplate destruction of both levels and possible discitis -osteomyelitis, with extensive paraspinal density potentially from hematoma or phlegmon. There is both posterior osseous ridging and foraminal impingement at this level. I also suspect a fracture of the spinous process of T8. Given these findings, consider thoracic spine MRI with and without contrast for further characterization. 2. Scattered atelectasis in the lung bases. 3. Cholelithiasis. 4. Bilateral renal cysts. Mildly complex right kidney upper pole lesion is nonspecific but probably a complex cyst. 5.  Prominent stool throughout the colon favors constipation. 6. Lower  lumbar impingement due to degenerative disc disease and facet arthropathy. Radiology assistant personnel have been notified to put me in telephone contact with the referring physician or the referring physician's clinical representative in order to discuss these findings. Once this communication is established I will issue an addendum to this report for documentation purposes.    EKG: pending   Assessment/Plan  Principal Problem:   Osteomyelitis of thoracic vertebra (HCC) /   Thoracic discitis / Abscess in epidural space of lumbar spine / Thoracic spine fracture  - MRI thoracic and lumbar spine consistent with discitis and osteomyelitis with endplate destructive change at the T8-9 interspace. Rim enhancing fluid within the anterior aspect of the intervertebral disc consistent with abscess. New small fluid collection posterior to the left facet joint worrisome for abscess, possible septic facet joints but could be due to degenerative disease.  - Also seen on CT scan complex fracture at T8-9 with endplate destruction of both levels and possible discitis, osteomyelitis with extensive paraspinal density potentially from hematoma or phlegmon.  - plan to take to OR tomorrow for decompression and diagnosis  - started Vancomycin and continue Rocephin as per previous home regimen - consult ID  Active Problems:   Benign prostatic hyperplasia (BPH) with straining on urination - Continue Flomax     DVT, lower extremity, proximal (HCC) - Continue xarelto    Diabetes mellitus, with long-term current use of insulin (HCC) - Continue Lantus 25 units at bedtime along with sliding scale insulin - Obtain A1c, no previous A1c on file    Dyslipidemia associated with type 2 diabetes mellitus ([place])  - Resume statin therapy   DVT prophylaxis: Pt on xarelto  Code Status: full code  Family Communication: Pt updated at bedside, several family members at bedside as well  Disposition Plan: inpatient to medical  floor Consults called: Neurosurgery  Admission status: Inpatient   Faye Ramsay MD [facility] Pager [phone]  If 7PM-7AM, please contact night-coverage [email or web address] Password [facility]  10/21/2016, 6:21 PM

## 2016-10-21 NOTE — ED Notes (Signed)
Pt returned from MRI °

## 2016-10-22 ENCOUNTER — Inpatient Hospital Stay (HOSPITAL_COMMUNITY): Payer: Medicare HMO | Admitting: Certified Registered"

## 2016-10-22 ENCOUNTER — Other Ambulatory Visit: Payer: Self-pay | Admitting: Licensed Clinical Social Worker

## 2016-10-22 ENCOUNTER — Encounter (HOSPITAL_COMMUNITY)
Admission: EM | Disposition: A | Discharge status: Skilled Nursing Facility | Payer: Self-pay | Source: Home / Self Care | Attending: Internal Medicine

## 2016-10-22 ENCOUNTER — Inpatient Hospital Stay (HOSPITAL_COMMUNITY): Payer: Medicare HMO

## 2016-10-22 ENCOUNTER — Encounter (HOSPITAL_COMMUNITY): Payer: Self-pay | Admitting: Surgery

## 2016-10-22 DIAGNOSIS — M4624 Osteomyelitis of vertebra, thoracic region: Secondary | ICD-10-CM | POA: Diagnosis present

## 2016-10-22 DIAGNOSIS — Z809 Family history of malignant neoplasm, unspecified: Secondary | ICD-10-CM

## 2016-10-22 DIAGNOSIS — Z885 Allergy status to narcotic agent status: Secondary | ICD-10-CM

## 2016-10-22 DIAGNOSIS — Z833 Family history of diabetes mellitus: Secondary | ICD-10-CM

## 2016-10-22 DIAGNOSIS — Z8371 Family history of colonic polyps: Secondary | ICD-10-CM

## 2016-10-22 DIAGNOSIS — Z8249 Family history of ischemic heart disease and other diseases of the circulatory system: Secondary | ICD-10-CM

## 2016-10-22 DIAGNOSIS — Z800 Family history of malignant neoplasm of digestive organs: Secondary | ICD-10-CM

## 2016-10-22 LAB — GLUCOSE, CAPILLARY
GLUCOSE-CAPILLARY: 93 mg/dL (ref 65–99)
GLUCOSE-CAPILLARY: 89 mg/dL (ref 65–99)
GLUCOSE-CAPILLARY: 127 mg/dL — AB (ref 65–99)
GLUCOSE-CAPILLARY: 136 mg/dL — AB (ref 65–99)
Glucose-Capillary: 57 mg/dL — ABNORMAL LOW (ref 65–99)
Glucose-Capillary: 102 mg/dL — ABNORMAL HIGH (ref 65–99)
Glucose-Capillary: 106 mg/dL — ABNORMAL HIGH (ref 65–99)
Glucose-Capillary: 157 mg/dL — ABNORMAL HIGH (ref 65–99)
Glucose-Capillary: 158 mg/dL — ABNORMAL HIGH (ref 65–99)

## 2016-10-22 LAB — CBC
HEMATOCRIT: 31.9 % — AB (ref 39.0–52.0)
HEMOGLOBIN: 9.9 g/dL — AB (ref 13.0–17.0)
MCH: 27.3 pg (ref 26.0–34.0)
MCHC: 31 g/dL (ref 30.0–36.0)
MCV: 88.1 fL (ref 78.0–100.0)
Platelets: 220 10*3/uL (ref 150–400)
RBC: 3.62 MIL/uL — ABNORMAL LOW (ref 4.22–5.81)
RDW: 14.1 % (ref 11.5–15.5)
WBC: 6.4 10*3/uL (ref 4.0–10.5)

## 2016-10-22 LAB — BASIC METABOLIC PANEL WITH GFR
Anion gap: 10 (ref 5–15)
BUN: 9 mg/dL (ref 6–20)
CO2: 28 mmol/L (ref 22–32)
Calcium: 9.1 mg/dL (ref 8.9–10.3)
Chloride: 102 mmol/L (ref 101–111)
Creatinine, Ser: 0.7 mg/dL (ref 0.61–1.24)
GFR calc Af Amer: >60 mL/min
GFR calc non Af Amer: >60 mL/min
Glucose, Bld: 91 mg/dL (ref 65–99)
Potassium: 3.9 mmol/L (ref 3.5–5.1)
Sodium: 140 mmol/L (ref 135–145)

## 2016-10-22 LAB — TYPE AND SCREEN
ABO/RH(D): O NEG
Antibody Screen: NEGATIVE

## 2016-10-22 LAB — ABO/RH: ABO/RH(D): O NEG

## 2016-10-22 LAB — HEMOGLOBIN A1C
Hgb A1c MFr Bld: 7.3 % — ABNORMAL HIGH (ref 4.8–5.6)
Mean Plasma Glucose: 163 mg/dL

## 2016-10-22 LAB — SURGICAL PCR SCREEN
MRSA, PCR: NEGATIVE
STAPHYLOCOCCUS AUREUS: NEGATIVE

## 2016-10-22 SURGERY — POSTERIOR LUMBAR FUSION 1 LEVEL
Anesthesia: General | Site: Spine Thoracic

## 2016-10-22 MED ORDER — FENTANYL CITRATE (PF) 100 MCG/2ML IJ SOLN
INTRAMUSCULAR | Status: AC
  Filled 2016-10-22: qty 2

## 2016-10-22 MED ORDER — BACITRACIN ZINC 500 UNIT/GM EX OINT
TOPICAL_OINTMENT | CUTANEOUS | Status: DC | PRN
  Administered 2016-10-22: 1 via TOPICAL

## 2016-10-22 MED ORDER — PROMETHAZINE HCL 25 MG/ML IJ SOLN: 6.2500 mg | INTRAMUSCULAR | Status: DC | PRN

## 2016-10-22 MED ORDER — PHENYLEPHRINE HCL 10 MG/ML IJ SOLN
INTRAMUSCULAR | Status: DC | PRN
  Administered 2016-10-22: 25 ug/min via INTRAVENOUS

## 2016-10-22 MED ORDER — BACITRACIN ZINC 500 UNIT/GM EX OINT
TOPICAL_OINTMENT | CUTANEOUS | Status: AC
  Filled 2016-10-22: qty 28.35

## 2016-10-22 MED ORDER — BUPIVACAINE HCL (PF) 0.5 % IJ SOLN
INTRAMUSCULAR | Status: DC | PRN
  Administered 2016-10-22: 30 mL

## 2016-10-22 MED ORDER — FENTANYL CITRATE (PF) 100 MCG/2ML IJ SOLN
INTRAMUSCULAR | Status: AC
  Filled 2016-10-22: qty 4

## 2016-10-22 MED ORDER — ONDANSETRON HCL 4 MG/2ML IJ SOLN
INTRAMUSCULAR | Status: AC
  Filled 2016-10-22: qty 4

## 2016-10-22 MED ORDER — PROPOFOL 10 MG/ML IV BOLUS
INTRAVENOUS | Status: DC | PRN
  Administered 2016-10-22: 160 mg via INTRAVENOUS

## 2016-10-22 MED ORDER — PROPOFOL 10 MG/ML IV BOLUS
INTRAVENOUS | Status: AC
  Filled 2016-10-22: qty 20

## 2016-10-22 MED ORDER — ROCURONIUM BROMIDE 50 MG/5ML IV SOSY
PREFILLED_SYRINGE | INTRAVENOUS | Status: AC
  Filled 2016-10-22: qty 10

## 2016-10-22 MED ORDER — ROCURONIUM BROMIDE 50 MG/5ML IV SOSY
PREFILLED_SYRINGE | INTRAVENOUS | Status: AC
  Filled 2016-10-22: qty 5

## 2016-10-22 MED ORDER — FENTANYL CITRATE (PF) 100 MCG/2ML IJ SOLN
INTRAMUSCULAR | Status: DC | PRN
  Administered 2016-10-22: 25 ug via INTRAVENOUS
  Administered 2016-10-22 (×6): 50 ug via INTRAVENOUS
  Administered 2016-10-22: 25 ug via INTRAVENOUS
  Administered 2016-10-22: 50 ug via INTRAVENOUS

## 2016-10-22 MED ORDER — BUPIVACAINE HCL (PF) 0.5 % IJ SOLN
INTRAMUSCULAR | Status: AC
  Filled 2016-10-22: qty 30

## 2016-10-22 MED ORDER — THROMBIN 20000 UNITS EX SOLR
CUTANEOUS | Status: AC
  Filled 2016-10-22: qty 20000

## 2016-10-22 MED ORDER — SUGAMMADEX SODIUM 200 MG/2ML IV SOLN
INTRAVENOUS | Status: AC
  Filled 2016-10-22: qty 2

## 2016-10-22 MED ORDER — ONDANSETRON HCL 4 MG/2ML IJ SOLN
INTRAMUSCULAR | Status: AC
  Filled 2016-10-22: qty 2

## 2016-10-22 MED ORDER — SUGAMMADEX SODIUM 200 MG/2ML IV SOLN
INTRAVENOUS | Status: DC | PRN
  Administered 2016-10-22: 150 mg via INTRAVENOUS

## 2016-10-22 MED ORDER — SODIUM CHLORIDE 0.9 % IV SOLN
INTRAVENOUS | Status: DC
  Administered 2016-10-22: 11:00:00 via INTRAVENOUS
  Administered 2016-10-23: 1000 mL via INTRAVENOUS
  Administered 2016-10-24: 14:00:00 via INTRAVENOUS

## 2016-10-22 MED ORDER — LABETALOL HCL 5 MG/ML IV SOLN
INTRAVENOUS | Status: AC
  Administered 2016-10-23: 20 mg
  Filled 2016-10-22: qty 4

## 2016-10-22 MED ORDER — LIDOCAINE 2% (20 MG/ML) 5 ML SYRINGE
INTRAMUSCULAR | Status: DC | PRN
  Administered 2016-10-22: 100 mg via INTRAVENOUS

## 2016-10-22 MED ORDER — LIDOCAINE-EPINEPHRINE (PF) 2 %-1:200000 IJ SOLN
INTRAMUSCULAR | Status: DC | PRN
  Administered 2016-10-22: 20 mL via INTRADERMAL

## 2016-10-22 MED ORDER — LIDOCAINE 2% (20 MG/ML) 5 ML SYRINGE
INTRAMUSCULAR | Status: AC
  Filled 2016-10-22: qty 5

## 2016-10-22 MED ORDER — METOPROLOL TARTRATE 5 MG/5ML IV SOLN
INTRAVENOUS | Status: AC
  Filled 2016-10-22: qty 5

## 2016-10-22 MED ORDER — 0.9 % SODIUM CHLORIDE (POUR BTL) OPTIME
TOPICAL | Status: DC | PRN
  Administered 2016-10-22: 1000 mL

## 2016-10-22 MED ORDER — LIDOCAINE 2% (20 MG/ML) 5 ML SYRINGE
INTRAMUSCULAR | Status: AC
  Filled 2016-10-22: qty 10

## 2016-10-22 MED ORDER — ARTIFICIAL TEARS OP OINT
TOPICAL_OINTMENT | OPHTHALMIC | Status: AC
  Filled 2016-10-22: qty 3.5

## 2016-10-22 MED ORDER — LACTATED RINGERS IV SOLN
INTRAVENOUS | Status: DC
  Administered 2016-10-22: 17:00:00 via INTRAVENOUS

## 2016-10-22 MED ORDER — SUGAMMADEX SODIUM 200 MG/2ML IV SOLN
INTRAVENOUS | Status: AC
  Filled 2016-10-22: qty 4

## 2016-10-22 MED ORDER — METOPROLOL TARTRATE 5 MG/5ML IV SOLN
INTRAVENOUS | Status: DC | PRN
  Administered 2016-10-22: 1 mg via INTRAVENOUS

## 2016-10-22 MED ORDER — GLUCERNA SHAKE PO LIQD
237.0000 mL | Freq: Two times a day (BID) | ORAL | Status: DC
  Administered 2016-10-23 – 2016-10-27 (×9): 237 mL via ORAL
  Filled 2016-10-22 (×9): qty 237

## 2016-10-22 MED ORDER — DEXTROSE 50 % IV SOLN
25.0000 mL | Freq: Once | INTRAVENOUS | Status: AC
Start: 2016-10-22 — End: 2016-10-22
  Administered 2016-10-22: 25 mL via INTRAVENOUS
  Filled 2016-10-22: qty 50

## 2016-10-22 MED ORDER — SUCCINYLCHOLINE CHLORIDE 200 MG/10ML IV SOSY
PREFILLED_SYRINGE | INTRAVENOUS | Status: AC
  Filled 2016-10-22: qty 10

## 2016-10-22 MED ORDER — LIDOCAINE-EPINEPHRINE (PF) 2 %-1:200000 IJ SOLN
INTRAMUSCULAR | Status: AC
  Filled 2016-10-22: qty 20

## 2016-10-22 MED ORDER — PHENYLEPHRINE 40 MCG/ML (10ML) SYRINGE FOR IV PUSH (FOR BLOOD PRESSURE SUPPORT)
PREFILLED_SYRINGE | INTRAVENOUS | Status: AC
  Filled 2016-10-22: qty 10

## 2016-10-22 MED ORDER — MEPERIDINE HCL 25 MG/ML IJ SOLN: 6.2500 mg | INTRAMUSCULAR | Status: DC | PRN

## 2016-10-22 MED ORDER — LABETALOL HCL 5 MG/ML IV SOLN
5.0000 mg | INTRAVENOUS | Status: DC | PRN
  Administered 2016-10-22: 5 mg via INTRAVENOUS

## 2016-10-22 MED ORDER — HYDROMORPHONE HCL 1 MG/ML IJ SOLN: 0.2500 mg | INTRAMUSCULAR | Status: DC | PRN

## 2016-10-22 MED ORDER — ROCURONIUM BROMIDE 100 MG/10ML IV SOLN
INTRAVENOUS | Status: DC | PRN
  Administered 2016-10-22 (×2): 20 mg via INTRAVENOUS
  Administered 2016-10-22: 50 mg via INTRAVENOUS

## 2016-10-22 MED ORDER — DEXTROSE 50 % IV SOLN
INTRAVENOUS | Status: AC
  Administered 2016-10-22: 25 mL via INTRAVENOUS
  Filled 2016-10-22: qty 50

## 2016-10-22 MED ORDER — THROMBIN 5000 UNITS EX SOLR
OROMUCOSAL | Status: DC | PRN
  Administered 2016-10-22: 19:00:00 via TOPICAL

## 2016-10-22 MED ORDER — THROMBIN 20000 UNITS EX SOLR
CUTANEOUS | Status: DC | PRN
  Administered 2016-10-22: 19:00:00 via TOPICAL

## 2016-10-22 MED ORDER — ONDANSETRON HCL 4 MG/2ML IJ SOLN
INTRAMUSCULAR | Status: DC | PRN
  Administered 2016-10-22: 4 mg via INTRAVENOUS

## 2016-10-22 MED ORDER — LACTATED RINGERS IV SOLN
INTRAVENOUS | Status: DC | PRN
  Administered 2016-10-22: 17:00:00 via INTRAVENOUS

## 2016-10-22 MED ORDER — SODIUM CHLORIDE 0.9 % IV SOLN
INTRAVENOUS | Status: DC | PRN
  Administered 2016-10-22: 21:00:00 via INTRAVENOUS

## 2016-10-22 MED ORDER — THROMBIN 5000 UNITS EX SOLR
CUTANEOUS | Status: AC
  Filled 2016-10-22: qty 5000

## 2016-10-22 SURGICAL SUPPLY — 64 items
BENZOIN TINCTURE PRP APPL 2/3 (GAUZE/BANDAGES/DRESSINGS) IMPLANT
BUR MATCHSTICK NEURO 3.0 LAGG (BURR) ×2 IMPLANT
BUR PRECISION FLUTE 5.0 (BURR) ×2 IMPLANT
CANISTER SUCT 3000ML PPV (MISCELLANEOUS) ×2 IMPLANT
CARTRIDGE OIL MAESTRO DRILL (MISCELLANEOUS) ×1 IMPLANT
CONT SPEC 4OZ CLIKSEAL STRL BL (MISCELLANEOUS) ×8 IMPLANT
COVER BACK TABLE 60X90IN (DRAPES) ×2 IMPLANT
DERMABOND ADVANCED (GAUZE/BANDAGES/DRESSINGS) ×1
DERMABOND ADVANCED .7 DNX12 (GAUZE/BANDAGES/DRESSINGS) ×1 IMPLANT
DIFFUSER DRILL AIR PNEUMATIC (MISCELLANEOUS) ×2 IMPLANT
DRAPE C-ARM 42X72 X-RAY (DRAPES) ×4 IMPLANT
DRAPE C-ARMOR (DRAPES) ×2 IMPLANT
DRAPE LAPAROTOMY 100X72X124 (DRAPES) ×2 IMPLANT
DRAPE POUCH INSTRU U-SHP 10X18 (DRAPES) ×2 IMPLANT
DRAPE SURG 17X23 STRL (DRAPES) ×2 IMPLANT
DRSG OPSITE POSTOP 4X10 (GAUZE/BANDAGES/DRESSINGS) ×2 IMPLANT
DURAPREP 26ML APPLICATOR (WOUND CARE) ×2 IMPLANT
ELECT REM PT RETURN 9FT ADLT (ELECTROSURGICAL) ×2
ELECTRODE REM PT RTRN 9FT ADLT (ELECTROSURGICAL) ×1 IMPLANT
GAUZE SPONGE 4X4 12PLY STRL (GAUZE/BANDAGES/DRESSINGS) IMPLANT
GAUZE SPONGE 4X4 16PLY XRAY LF (GAUZE/BANDAGES/DRESSINGS) ×2 IMPLANT
GLOVE BIO SURGEON STRL SZ 6.5 (GLOVE) ×2 IMPLANT
GLOVE BIO SURGEON STRL SZ7 (GLOVE) ×2 IMPLANT
GLOVE BIOGEL PI IND STRL 8 (GLOVE) ×3 IMPLANT
GLOVE BIOGEL PI IND STRL 8.5 (GLOVE) ×1 IMPLANT
GLOVE BIOGEL PI INDICATOR 8 (GLOVE) ×3
GLOVE BIOGEL PI INDICATOR 8.5 (GLOVE) ×1
GLOVE ECLIPSE 6.5 STRL STRAW (GLOVE) ×4 IMPLANT
GLOVE ECLIPSE 7.5 STRL STRAW (GLOVE) ×8 IMPLANT
GOWN STRL REUS W/ TWL LRG LVL3 (GOWN DISPOSABLE) ×3 IMPLANT
GOWN STRL REUS W/ TWL XL LVL3 (GOWN DISPOSABLE) ×1 IMPLANT
GOWN STRL REUS W/TWL 2XL LVL3 (GOWN DISPOSABLE) ×4 IMPLANT
GOWN STRL REUS W/TWL LRG LVL3 (GOWN DISPOSABLE) ×3
GOWN STRL REUS W/TWL XL LVL3 (GOWN DISPOSABLE) ×1
GRAFT BN 10X1XDBM MAGNIFUSE (Bone Implant) ×1 IMPLANT
GRAFT BN 5X1XSPNE CVD POST DBM (Bone Implant) ×1 IMPLANT
GRAFT BONE MAGNIFUSE 1X10CM (Bone Implant) ×1 IMPLANT
GRAFT BONE MAGNIFUSE 1X5CM (Bone Implant) ×1 IMPLANT
HEMOSTAT POWDER KIT SURGIFOAM (HEMOSTASIS) ×2 IMPLANT
KIT BASIN OR (CUSTOM PROCEDURE TRAY) ×2 IMPLANT
KIT POSITION SURG JACKSON T1 (MISCELLANEOUS) ×2 IMPLANT
KIT ROOM TURNOVER OR (KITS) ×2 IMPLANT
NEEDLE HYPO 25X1 1.5 SAFETY (NEEDLE) ×2 IMPLANT
NEEDLE SPNL 18GX3.5 QUINCKE PK (NEEDLE) IMPLANT
NS IRRIG 1000ML POUR BTL (IV SOLUTION) ×2 IMPLANT
OIL CARTRIDGE MAESTRO DRILL (MISCELLANEOUS) ×2
PACK LAMINECTOMY NEURO (CUSTOM PROCEDURE TRAY) ×2 IMPLANT
PAD ARMBOARD 7.5X6 YLW CONV (MISCELLANEOUS) ×4 IMPLANT
ROD RELINE-O 5.5X300 STRT NS (Rod) ×2 IMPLANT
ROD RELINE-O 5.5X300MM STRT (Rod) ×2 IMPLANT
SCREW LOCK RELINE 5.5 TULIP (Screw) ×16 IMPLANT
SCREW RELINE-O POLY 5.5X40 (Screw) ×8 IMPLANT
SCREW RELINE-O POLY 6.5X40 (Screw) ×8 IMPLANT
SPONGE LAP 4X18 X RAY DECT (DISPOSABLE) IMPLANT
SPONGE SURGIFOAM ABS GEL 100 (HEMOSTASIS) ×2 IMPLANT
STRIP CLOSURE SKIN 1/2X4 (GAUZE/BANDAGES/DRESSINGS) IMPLANT
SUT PROLENE 6 0 BV (SUTURE) IMPLANT
SUT VIC AB 0 CT1 18XCR BRD8 (SUTURE) ×3 IMPLANT
SUT VIC AB 0 CT1 8-18 (SUTURE) ×3
SUT VIC AB 2-0 CT1 18 (SUTURE) ×2 IMPLANT
SUT VIC AB 3-0 SH 8-18 (SUTURE) ×2 IMPLANT
TOWEL OR 17X24 6PK STRL BLUE (TOWEL DISPOSABLE) ×2 IMPLANT
TOWEL OR 17X26 10 PK STRL BLUE (TOWEL DISPOSABLE) ×2 IMPLANT
WATER STERILE IRR 1000ML POUR (IV SOLUTION) ×2 IMPLANT

## 2016-10-22 NOTE — Consult Note (Signed)
[place] for Infectious Disease       Reason for Consult: discitis/osteomyelitis    Referring Physician: Dr. Doyle Askew  Principal Problem:   Osteomyelitis of thoracic vertebra [facility]) Active Problems:   Benign prostatic hyperplasia (BPH) with straining on urination   DVT, lower extremity, proximal ([place])   Diabetes mellitus, with long-term current use of insulin ([place])   Dyslipidemia associated with type 2 diabetes mellitus ([place])   Thoracic discitis   Abscess in epidural space of lumbar spine   Thoracic spine fracture ([place])   . cefTRIAXone (ROCEPHIN)  IV  2 g Intravenous Q12H  . insulin aspart  0-15 Units Subcutaneous TID WC  . insulin glargine  25 Units Subcutaneous QHS  . multivitamin with minerals  1 tablet Oral Daily  . polyethylene glycol  17 g Oral BID  . simvastatin  40 mg Oral q1800  . tamsulosin  0.4 mg Oral QPC breakfast  . vancomycin  1,000 mg Intravenous Q12H    Recommendations: Continue vancomycin and ceftriaxone Follow cultures from OR   Assessment: He has discitis and osteomyelitis with small fluid collection at L4-5 area despite appropriate antibiotics and now to get surgical debridement.    Antibiotics: Ceftriaxone since December Vancomycin day 2  HPI: Charles Parker is a 71 y.o. male with recent hospitalization for E coli bacteremia and felt to be septic with discitis/osteomyelitis of L4-5 on ceftriaxone since then for a projected 6-8 weeks.  He though came back to ED yesterday with LE weakness and MRI c/w worsening discitis with small epidural abscess.  Still with considerable pain. No fever, no chills, no urinary complaints.  Has been compliant.  Previoulsy with negative TEE.    MRI independently reviewed  Review of Systems:  Constitutional: negative for fevers and chills Gastrointestinal: negative for diarrhea Genitourinary: negative for dysuria Integument/breast: negative for rash All other systems reviewed and are negative    Past  Medical History:  Diagnosis Date  . Clotting disorder (HCC)    DVT  . Diabetes mellitus    type II  . Diverticulosis   . DVT (deep venous thrombosis) ([place])   . Glaucoma    no eye drops   . Hepatic cyst   . Hypercholesterolemia   . Hypertension   . Myocardial infarction    very mild long ago   . Renal cyst   . Sleep apnea   . Tubulovillous adenoma     Social History  Substance Use Topics  . Smoking status: Never Smoker  . Smokeless tobacco: Never Used  . Alcohol use 0.0 oz/week     Comment: once every two months    Family History  Problem Relation Age of Onset  . Hypertension Mother   . Diabetes Mother   . Cancer Mother     ? location  . Colon cancer Father 76  . Colon polyps Neg Hx     Allergies  Allergen Reactions  . Metformin And Related Diarrhea  . Codeine Rash    All over the body    Physical Exam: Constitutional: in no apparent distress but uncomfortable with back pain Vitals:   10/22/16 0115 10/22/16 0717  BP: 132/75 (!) 142/81  Pulse: 90 82  Resp: (!) 21 17  Temp: 99 F (37.2 C) 97.3 F (36.3 C)   EYES: anicteric ENMT: no thrush Cardiovascular: Cor RRR Respiratory: CTA B; normal respiratory effort GI: Bowel sounds are normal, liver is not enlarged, spleen is not enlarged Musculoskeletal: no pedal edema noted  Skin: negatives: no rash Lymph: no cervical lad  Lab Results  Component Value Date   WBC 6.4 10/22/2016   HGB 9.9 (L) 10/22/2016   HCT 31.9 (L) 10/22/2016   MCV 88.1 10/22/2016   PLT 220 10/22/2016    Lab Results  Component Value Date   CREATININE 0.70 10/22/2016   BUN 9 10/22/2016   NA 140 10/22/2016   K 3.9 10/22/2016   CL 102 10/22/2016   CO2 28 10/22/2016    Lab Results  Component Value Date   ALT 16 (L) 10/21/2016   AST 22 10/21/2016   ALKPHOS 56 10/21/2016     Microbiology: Recent Results (from the past 240 hour(s))  Surgical pcr screen     Status: None   Collection Time: 10/22/16  6:57 AM  Result Value Ref  Range Status   MRSA, PCR NEGATIVE NEGATIVE Final   Staphylococcus aureus NEGATIVE NEGATIVE Final    Comment:        The Xpert SA Assay (FDA approved for NASAL specimens in patients over 55 years of age), is one component of a comprehensive surveillance program.  Test performance has been validated by [facility] for patients greater than or equal to 25 year old. It is not intended to diagnose infection nor to guide or monitor treatment.     Scharlene Gloss, [place] for Infectious [facility] [place] [email or web address] [email or web address] pager  [phone] cell 10/22/2016, 2:59 PM

## 2016-10-22 NOTE — Progress Notes (Signed)
Patient ID: Charles Parker, male   DOB: 05/19/1946, 71 y.o.   MRN: [id number]    PROGRESS NOTE    Charles Parker  [id number] DOB: 26-Jan-1946 DOA: 10/21/2016  PCP: Scarlette Calico, MD   HPI: 71 y.o. male with medical history significant for recent hospitalization 09/26/16 - 10/05/16 for sepsis due to E.Coli bacteremia and pyelonephritis as well as L4, L5 epidural abscess with osteomyelitis and phlegmon and was supposed to be on rocephin through 11/07/16. TEE was done 10/05/2016 and was negative for endocarditis. This time pt presented with progressive LE weakness but no fevers, no specific urinary concerns, no chest pain, no dyspnea, no abd concerns.   Assessment and plan:  Osteomyelitis of thoracic vertebra [facility]) /  Thoracic discitis / Abscess in epidural space of lumbar spine / Thoracic spine fracture  - MRI thoracic and lumbar spine consistent with discitis and osteomyelitis with endplate destructive change at the T8-9 interspace. Rim enhancing fluid within the anterior aspect of the intervertebral disc consistent with abscess. New small fluid collection posterior to the left facet joint worrisome for abscess, possible septic facet joints but could be due to degenerative disease.  - Also seen on CT scan complex fracture at T8-9 with endplate destruction of both levels and possible discitis, osteomyelitis with extensive paraspinal density potentially from hematoma or phlegmon.  - plan to take to OR tomorrow for decompression and diagnosis  - started Vancomycin, today is day #2 and will continue Rocephin as per previous home regimen - blood cultures obtained on admission - ID team consulted for assistance   Active Problems:   Benign prostatic hyperplasia (BPH) with straining on urination - Continue Flomax     DVT, lower extremity, proximal ([place]) - Xarelto on hold in an anticipation of an intervention     Diabetes mellitus, with long-term current use of insulin (HCC) - Continue Lantus  25 units at bedtime along with sliding scale insulin - A1C 7.3    Dyslipidemia associated with type 2 diabetes mellitus ([place])  - Resume statin therapy   DVT prophylaxis: Xarelto on hold in an anticipation an intervention  Code Status: Full  Family Communication: Patient at bedside  Disposition Plan: To be determined   Consultants:   Neurosurgery   Procedures:   None  Antimicrobials:   Vancomycin 1/18 -->  Rocephin resumed from previous home regimen --> 11/07/2016   Subjective: No events overnight.   Objective: Vitals:   10/21/16 1930 10/21/16 2008 10/22/16 0115 10/22/16 0717  BP: 127/83 (!) 148/87 132/75 (!) 142/81  Pulse: 93 96 90 82  Resp: 21 (!) 21 (!) 21 17  Temp:  99 F (37.2 C) 99 F (37.2 C) 97.3 F (36.3 C)  TempSrc:  Oral Oral Oral  SpO2: 96% 98% 98%   Weight:      Height:        Intake/Output Summary (Last 24 hours) at 10/22/16 1244 Last data filed at 10/22/16 [id number]  Gross per 24 hour  Intake                0 ml  Output             1300 ml  Net            -1300 ml   Filed Weights   10/21/16 1204  Weight: 78.9 kg (174 lb)    Examination:  General exam: Appears calm and comfortable  Respiratory system: Respiratory effort normal. Cardiovascular system: S1 & S2 heard, RRR. No JVD,  murmurs, rubs, gallops or clicks. No pedal edema. Gastrointestinal system: Abdomen is nondistended, soft and nontender. No organomegaly or masses felt. Normal bowel sounds heard. Central nervous system: Alert and oriented. LE weakness bilaterally 2-3/5  Data Reviewed: I have personally reviewed following labs and imaging studies  CBC:  Recent Labs Lab 10/21/16 1258 10/22/16 0358  WBC 6.5 6.4  NEUTROABS 4.6  --   HGB 10.0* 9.9*  HCT 32.1* 31.9*  MCV 88.4 88.1  PLT 236 [id number]   Basic Metabolic Panel:  Recent Labs Lab 10/21/16 1258 10/22/16 0358  NA 138 140  K 3.7 3.9  CL 103 102  CO2 26 28  GLUCOSE 113* 91  BUN 11 9  CREATININE 0.76 0.70  CALCIUM  9.1 9.1   Liver Function Tests:  Recent Labs Lab 10/21/16 1258  AST 22  ALT 16*  ALKPHOS 56  BILITOT 0.3  PROT 7.4  ALBUMIN 2.5*    Recent Labs Lab 10/21/16 1258  LIPASE 15   HbA1C:  Recent Labs  10/21/16 1840  HGBA1C 7.3*   CBG:  Recent Labs Lab 10/21/16 2205 10/22/16 0710 10/22/16 1132  GLUCAP 163* 93 89   Urine analysis:    Component Value Date/Time   COLORURINE YELLOW 10/21/2016 1312   APPEARANCEUR TURBID (A) 10/21/2016 1312   LABSPEC 1.015 10/21/2016 1312   PHURINE 8.0 10/21/2016 1312   GLUCOSEU NEGATIVE 10/21/2016 1312   GLUCOSEU NEGATIVE 09/08/2016 1513   HGBUR MODERATE (A) 10/21/2016 1312   BILIRUBINUR NEGATIVE 10/21/2016 1312   BILIRUBINUR negative 08/17/2016 1359   KETONESUR NEGATIVE 10/21/2016 1312   PROTEINUR NEGATIVE 10/21/2016 1312   UROBILINOGEN 0.2 09/08/2016 1513   NITRITE NEGATIVE 10/21/2016 1312   LEUKOCYTESUR NEGATIVE 10/21/2016 1312    Recent Results (from the past 240 hour(s))  Surgical pcr screen     Status: None   Collection Time: 10/22/16  6:57 AM  Result Value Ref Range Status   MRSA, PCR NEGATIVE NEGATIVE Final   Staphylococcus aureus NEGATIVE NEGATIVE Final    Radiology Studies: Mr Thoracic Spine W Wo Contrast  Result Date: 10/21/2016 CLINICAL DATA:  Diabetic patient with a recent history of discitis and epidural abscess. Increasing left worse than right lower extremity weakness. EXAM: MRI THORACIC AND LUMBAR SPINE WITHOUT AND WITH CONTRAST TECHNIQUE: Multiplanar and multiecho pulse sequences of the thoracic and lumbar spine were obtained without and with intravenous contrast. CONTRAST:  17 ml MULTIHANCE GADOBENATE DIMEGLUMINE 529 MG/ML IV SOLN COMPARISON:  MRI lumbar spine 09/30/2016. CT abdomen and pelvis this same day. FINDINGS: MRI THORACIC SPINE FINDINGS Alignment:  Maintained. Vertebrae: Endplate destructive change is seen about the T8-9 level with marked marrow edema and enhancement throughout both vertebral bodies  consistent with discitis and osteomyelitis. Edema and enhancement are also present of the T8 and T9 facets and pedicles. Extensive paraspinous inflammatory change is present about the T8 and T9 vertebral bodies. Cord:  Demonstrates normal signal. Paraspinal and other soft tissues: Small bilateral pleural effusions are seen. Disc levels: T8-9: No epidural abscess is seen. There is extensive enhancement of epidural soft tissues and a disc bulge deforming the cord at T8-9. Rim enhancing fluid within the anterior aspect of the intervertebral disc space consistent with abscess measures 1.8 cm transverse by 0.9 cm AP by 0.9 cm craniocaudal. T5-6 and T6-7: Small left paracentral protrusions without central canal or foraminal stenosis. MRI LUMBAR SPINE FINDINGS Segmentation:  Standard. Alignment: Trace retrolisthesis L1 on L2 is unchanged. Otherwise maintained. Vertebrae: Heterogeneous marrow signal throughout is again seen.  No fracture. Conus medullaris: Extends to the T12-L1 level and appears normal. Paraspinal and other soft tissues: Multiple bilateral renal cysts are again seen. Disc levels: L1-2: Trace retrolisthesis.  Central canal and foramina are open. L2-3:  Mild facet degenerative change.  Otherwise negative. L3-4: Mild to moderate facet arthropathy and is very small disc bulge. The central canal and foramina are open. L4-5: Advanced bilateral facet degenerative change is seen. Small epidural fluid collection subjacent to the ligamentum flavum on the right is no longer visualized. Fluid collection in the posterior paraspinous musculature on the left adjacent to the L4 spinous process has also resolved. There is marrow edema and mild enhancement in the L4 and L5 pedicles and facets, not markedly changed. A new small fluid collection off the posterior aspect of the left facet joint measures 0.6 cm transverse by 0.9 cm AP by 0.7 cm craniocaudal. Posterior epidural soft tissues at this level are edematous and  enhancing. L5-S1: Facet arthropathy. Small annular fissure and shallow bulge. The central canal and foramina are open. IMPRESSION: Findings consistent with discitis and osteomyelitis with endplate destructive change about the T8-9 interspace. Extensive paraspinous inflammatory change extends into the epidural space and there is a disc bulge at this level mildly deforming the cord. No epidural abscess is identified. Rim enhancing fluid within the anterior aspect of the intervertebral disc space as described above is consistent with abscess. Two small fluid collections at L4-5 seen on the prior examination have resolved. However, there is a new small fluid collection posterior to the left facet joint worrisome for abscess as described above. Marrow edema and enhancement in the pedicles and facets bilaterally is most worrisome for septic facet joints but could be due to degenerative disease. The central canal is open at L4-5. Electronically Signed   By: Inge Rise M.D.   On: 10/21/2016 16:23   Mr Lumbar Spine W Wo Contrast  Result Date: 10/21/2016 CLINICAL DATA:  Diabetic patient with a recent history of discitis and epidural abscess. Increasing left worse than right lower extremity weakness. EXAM: MRI THORACIC AND LUMBAR SPINE WITHOUT AND WITH CONTRAST TECHNIQUE: Multiplanar and multiecho pulse sequences of the thoracic and lumbar spine were obtained without and with intravenous contrast. CONTRAST:  17 ml MULTIHANCE GADOBENATE DIMEGLUMINE 529 MG/ML IV SOLN COMPARISON:  MRI lumbar spine 09/30/2016. CT abdomen and pelvis this same day. FINDINGS: MRI THORACIC SPINE FINDINGS Alignment:  Maintained. Vertebrae: Endplate destructive change is seen about the T8-9 level with marked marrow edema and enhancement throughout both vertebral bodies consistent with discitis and osteomyelitis. Edema and enhancement are also present of the T8 and T9 facets and pedicles. Extensive paraspinous inflammatory change is present  about the T8 and T9 vertebral bodies. Cord:  Demonstrates normal signal. Paraspinal and other soft tissues: Small bilateral pleural effusions are seen. Disc levels: T8-9: No epidural abscess is seen. There is extensive enhancement of epidural soft tissues and a disc bulge deforming the cord at T8-9. Rim enhancing fluid within the anterior aspect of the intervertebral disc space consistent with abscess measures 1.8 cm transverse by 0.9 cm AP by 0.9 cm craniocaudal. T5-6 and T6-7: Small left paracentral protrusions without central canal or foraminal stenosis. MRI LUMBAR SPINE FINDINGS Segmentation:  Standard. Alignment: Trace retrolisthesis L1 on L2 is unchanged. Otherwise maintained. Vertebrae: Heterogeneous marrow signal throughout is again seen. No fracture. Conus medullaris: Extends to the T12-L1 level and appears normal. Paraspinal and other soft tissues: Multiple bilateral renal cysts are again seen. Disc levels: L1-2: Trace  retrolisthesis.  Central canal and foramina are open. L2-3:  Mild facet degenerative change.  Otherwise negative. L3-4: Mild to moderate facet arthropathy and is very small disc bulge. The central canal and foramina are open. L4-5: Advanced bilateral facet degenerative change is seen. Small epidural fluid collection subjacent to the ligamentum flavum on the right is no longer visualized. Fluid collection in the posterior paraspinous musculature on the left adjacent to the L4 spinous process has also resolved. There is marrow edema and mild enhancement in the L4 and L5 pedicles and facets, not markedly changed. A new small fluid collection off the posterior aspect of the left facet joint measures 0.6 cm transverse by 0.9 cm AP by 0.7 cm craniocaudal. Posterior epidural soft tissues at this level are edematous and enhancing. L5-S1: Facet arthropathy. Small annular fissure and shallow bulge. The central canal and foramina are open. IMPRESSION: Findings consistent with discitis and osteomyelitis  with endplate destructive change about the T8-9 interspace. Extensive paraspinous inflammatory change extends into the epidural space and there is a disc bulge at this level mildly deforming the cord. No epidural abscess is identified. Rim enhancing fluid within the anterior aspect of the intervertebral disc space as described above is consistent with abscess. Two small fluid collections at L4-5 seen on the prior examination have resolved. However, there is a new small fluid collection posterior to the left facet joint worrisome for abscess as described above. Marrow edema and enhancement in the pedicles and facets bilaterally is most worrisome for septic facet joints but could be due to degenerative disease. The central canal is open at L4-5. Electronically Signed   By: Inge Rise M.D.   On: 10/21/2016 16:23   Ct Renal Stone Study  Addendum Date: 10/21/2016   ADDENDUM REPORT: 10/21/2016 13:58 ADDENDUM: The original report was by Dr. Van Clines. The following addendum is by Dr. Van Clines: These results were called by telephone at the time of interpretation on 10/21/2016 at 1:55 pm to Dr. Tanna Furry , who verbally acknowledged these results. Electronically Signed   By: Van Clines M.D.   On: 10/21/2016 13:58   Result Date: 10/21/2016 CLINICAL DATA:  Abdominal pain and low back pain over the last 2-3 weeks. EXAM: CT ABDOMEN AND PELVIS WITHOUT CONTRAST TECHNIQUE: Multidetector CT imaging of the abdomen and pelvis was performed following the standard protocol without IV contrast. COMPARISON:  None. FINDINGS: Lower chest: Atelectasis in the right middle lobe with subsegmental atelectasis in both lower lobes. Paraspinal edema/ hematoma at the T8- 9 level, please see the musculoskeletal section below for further detail. Hepatobiliary: Small dependent densities in the gallbladder compatible with gallstones. Stable small hypodense 5 mm lesion in segment 3 of the liver. Pancreas: Unremarkable  Spleen: Unremarkable Adrenals/Urinary Tract: Adrenal glands normal. Hypodense renal lesions are observed, most of fluid density but with a 5.2 by 3.7 cm mildly complex upper pole exophytic lesion with internal density 15 Hounsfield units. The other cysts are at about 2 Hounsfield units. Urinary bladder decompressed by a Foley catheter, no urinary tract stones identified. Stomach/Bowel: Appendix normal. Postoperative findings of the rectosigmoid junction. Prominent stool throughout the colon favors constipation. Several scattered descending colon and sigmoid diverticula. Vascular/Lymphatic: Mild aortoiliac atherosclerotic vascular disease. Reproductive: Unremarkable Other: No supplemental non-categorized findings. Musculoskeletal: There is an apparent fracture of the spinous process of T8 with scalloped irregular endplate destructive findings at the T8-9 level, compression fracture of T 8, posterior osseous ridging, and striking bilateral osseous foraminal stenosis at this level. I  cannot exclude discitis-osteomyelitis and on MRI of the thoracic spine with attention to this region should be considered. There is considerable paraspinal edema/hematoma. There is arthropathy of the adjacent costovertebral joints. Bridging spurring of the left sacroiliac joint. Mild spurring of both hips. Lumbar degenerative disc disease and facet arthropathy causing impingement at L3-4, L4-5, and L5-S1. IMPRESSION: 1. Complex fracture at T8-9 with endplate destruction of both levels and possible discitis -osteomyelitis, with extensive paraspinal density potentially from hematoma or phlegmon. There is both posterior osseous ridging and foraminal impingement at this level. I also suspect a fracture of the spinous process of T8. Given these findings, consider thoracic spine MRI with and without contrast for further characterization. 2. Scattered atelectasis in the lung bases. 3. Cholelithiasis. 4. Bilateral renal cysts. Mildly complex right  kidney upper pole lesion is nonspecific but probably a complex cyst. 5.  Prominent stool throughout the colon favors constipation. 6. Lower lumbar impingement due to degenerative disc disease and facet arthropathy. Radiology assistant personnel have been notified to put me in telephone contact with the referring physician or the referring physician's clinical representative in order to discuss these findings. Once this communication is established I will issue an addendum to this report for documentation purposes. Electronically Signed: By: Van Clines M.D. On: 10/21/2016 13:52    Scheduled Meds: . cefTRIAXone (ROCEPHIN)  IV  2 g Intravenous Q12H  . insulin aspart  0-15 Units Subcutaneous TID WC  . insulin glargine  25 Units Subcutaneous QHS  . multivitamin with minerals  1 tablet Oral Daily  . polyethylene glycol  17 g Oral BID  . simvastatin  40 mg Oral q1800  . tamsulosin  0.4 mg Oral QPC breakfast  . vancomycin  1,000 mg Intravenous Q12H   Continuous Infusions: . sodium chloride       LOS: 1 day   Time spent: 20 minutes   Faye Ramsay, MD [facility] Pager [phone]  If 7PM-7AM, please contact night-coverage [email or web address] Password [email or web address] 10/22/2016, 12:44 PM

## 2016-10-22 NOTE — Consult Note (Signed)
   [facility] CM Inpatient Consult   10/22/2016  REXALL MAYBANK May 04, 1946 [id number]   Spoke with Mr. Colbeck and his brothers at bedside. He is active with [facility] program. Active consent remains on file. Please see chart review then notes for further patient outreach details. Mr. Violett and his brothers indicate they are awaiting to hear whether Mr. Au is having surgery today. They endorse Mr. Cashner was admitted from [facility]. They also report that there is a Medicaid application pending. Mr. Friese has been followed by [place] while at [facility]. Made inpatient [name] aware of above. Contact information left at bedside. Will continue to follow.    Marthenia Rolling, MSN-Ed, RN,BSN [facility] Liaison [phone]

## 2016-10-22 NOTE — Anesthesia Procedure Notes (Signed)
Procedure Name: Intubation Date/Time: 10/22/2016 5:24 PM Performed by: Suzy Bouchard Pre-anesthesia Checklist: Patient identified, Emergency Drugs available, Suction available, Patient being monitored and Timeout performed Patient Re-evaluated:Patient Re-evaluated prior to inductionOxygen Delivery Method: Circle system utilized Preoxygenation: Pre-oxygenation with 100% oxygen Intubation Type: IV induction Ventilation: Mask ventilation without difficulty Laryngoscope Size: Miller and 2 Grade View: Grade I Tube type: Oral Tube size: 8.0 mm Number of attempts: 1 Airway Equipment and Method: Stylet Placement Confirmation: ETT inserted through vocal cords under direct vision,  positive ETCO2 and breath sounds checked- equal and bilateral Secured at: 22 cm Tube secured with: Tape Dental Injury: Teeth and Oropharynx as per pre-operative assessment

## 2016-10-22 NOTE — Clinical Social Work Note (Signed)
Clinical Social Work Assessment  Patient Details  Name: Charles Parker MRN: [id number] Date of Birth: 10-Dec-1945  Date of referral:  10/22/16               Reason for consult:  Discharge Planning (from [facility])                Permission sought to share information with:  Case Manager, Customer service manager, Family Supports Permission granted to share information::  Yes, Verbal Permission Granted  Name::     [name] and Cathie Beams (brothers)  Agency::  [facility]  Relationship::     Contact Information:     Housing/Transportation Living arrangements for the past 2 months:  [place] of Information:  Patient, Scientist, water quality, Tourist information centre manager, Other (Comment Required) (siblings) Patient Interpreter Needed:  None Criminal Activity/Legal Involvement Pertinent to Current Situation/Hospitalization:  No - Comment as needed Significant Relationships:  Other Family Members, Community Support, Adult Children Lives with:  Facility Resident Do you feel safe going back to the place where you live?  Yes Need for family participation in patient care:  Yes (Comment)  Care giving concerns:  LCSW met with brothers and patient at bedside.  Patient is from [facility].  Plan at discharge when medically stable will be for return to SNF.   Social Worker assessment / plan:  LCSW received consult that patient is from SNF [facility]. Patient and family report positive experience at SNF and hoping to return to help with strength and safety.  Plan is for OR for surgery.  Will ask facility to submit for insurance authorization and follow up with patient progress.  FL2 updated.  Plan: SNF when medically stable  Employment status:  Retired Nurse, adult PT Recommendations:  [place] / Referral to community resources:  [place]  Patient/Family's Response to care:  Agreeable to plan  Patient/Family's  Understanding of and Emotional Response to Diagnosis, Current Treatment, and Prognosis:  Patient is very frustrated because he is very hungry and has not eaten in almost 24 hours.  Surgery has not been scheduled and he remains NPO.  Family reports they are feeling positive about the surgery and hopeful this will help patient progress.   Emotional Assessment Appearance:  Appears stated age Attitude/Demeanor/Rapport:    Affect (typically observed):  Accepting, Adaptable Orientation:  Oriented to Self, Oriented to Place, Oriented to  Time, Oriented to Situation Alcohol / Substance use:  Not Applicable Psych involvement (Current and /or in the community):  No (Comment)  Discharge Needs  Concerns to be addressed:  No discharge needs identified Readmission within the last 30 days:  Yes Current discharge risk:  None Barriers to Discharge:  Ship broker, Continued Medical Work up   Marshell Garfinkel 10/22/2016, 3:19 PM

## 2016-10-22 NOTE — Anesthesia Preprocedure Evaluation (Addendum)
Anesthesia Evaluation  Patient identified by MRN, date of birth, ID band Patient awake    Reviewed: Allergy & Precautions, H&P , NPO status , Patient's Chart, lab work & pertinent test results  Airway Mallampati: II  TM Distance: >3 FB Neck ROM: Full    Dental  (+) Dental Advisory Given, Edentulous Upper, Edentulous Lower Loose right lower tooth. Missing many teeth. Broken right upper premolar.:   Pulmonary sleep apnea ,    Pulmonary exam normal breath sounds clear to auscultation       Cardiovascular Exercise Tolerance: Good hypertension, Pt. on medications + Past MI and + Peripheral Vascular Disease  Normal cardiovascular exam Rhythm:Regular Rate:Normal  ECG: normal. CXR: stable with no new abnormality.   Neuro/Psych negative neurological ROS  negative psych ROS   GI/Hepatic negative GI ROS, Neg liver ROS,   Endo/Other  diabetes, Type 2, Oral Hypoglycemic Agents  Renal/GU Renal disease     Musculoskeletal  (+) Arthritis ,   Abdominal   Peds  Hematology negative hematology ROS (+)   Anesthesia Other Findings   Reproductive/Obstetrics negative OB ROS                            Anesthesia Physical Anesthesia Plan  ASA: III and emergent  Anesthesia Plan: General   Post-op Pain Management:    Induction: Intravenous  Airway Management Planned: Oral ETT  Additional Equipment:   Intra-op Plan:   Post-operative Plan: Extubation in OR  Informed Consent: I have reviewed the patients History and Physical, chart, labs and discussed the procedure including the risks, benefits and alternatives for the proposed anesthesia with the patient or authorized representative who has indicated his/her understanding and acceptance.   Dental advisory given  Plan Discussed with: CRNA  Anesthesia Plan Comments:         Anesthesia Quick Evaluation

## 2016-10-22 NOTE — Progress Notes (Signed)
Initial Nutrition Assessment  DOCUMENTATION CODES:   Not applicable  INTERVENTION:  Once diet advances, provide Glucerna Shake po BID, each supplement provides 220 kcal and 10 grams of protein.  NUTRITION DIAGNOSIS:   Increased nutrient needs related to  (surgery) as evidenced by estimated needs.  GOAL:   Patient will meet greater than or equal to 90% of their needs  MONITOR:   Supplement acceptance, Diet advancement, Labs, Weight trends, Skin, I & O's  REASON FOR ASSESSMENT:   Malnutrition Screening Tool    ASSESSMENT:   71 y.o. male with medical history significant for recent hospitalization 09/26/16 - 10/05/16 for sepsis due to E.Coli bacteremia and pyelonephritis as well as L4, L5 epidural abscess with osteomyelitis and phlegmon and was supposed to be on rocephin through 11/07/16. TEE was done 10/05/2016 and was negative for endocarditis. This time pt presented with progressive LE weakness but no fevers, no specific urinary concerns, no chest pain, no dyspnea, no abd concerns.  Pt with Osteomyelitis of thoracic vertebra (HCC) /  Thoracic discitis / Abscess in epidural space of lumbar spine / Thoracic spine fracture   Pt was unavailable during time of visit. RD unable to obtain most recent nutrition history. No recent meal completion recorded.RD to order nutritional supplements to aid in caloric and protein needs. Nursing staff to provide once diet advances. Unable to complete Nutrition-Focused physical exam at this time. RD to perform at next visit. Labs and medications reviewed.   Diet Order:  Diet NPO time specified  Skin:  Reviewed, no issues  Last BM:  1/17  Height:   Ht Readings from Last 1 Encounters:  10/21/16 6' (1.829 m)    Weight:   Wt Readings from Last 1 Encounters:  10/21/16 174 lb (78.9 kg)    Ideal Body Weight:  80.9 kg  BMI:  Body mass index is 23.6 kg/m.  Estimated Nutritional Needs:   Kcal:  1950-2150  Protein:  95-105 grams  Fluid:  1.9 -  2.1 L/day  EDUCATION NEEDS:   No education needs identified at this time  Corrin Parker, MS, RD, LDN Pager # [phone] After hours/ weekend pager # [phone]

## 2016-10-22 NOTE — Progress Notes (Signed)
CRITICAL VALUE ALERT  Critical value received:  CBG 57  Date of notification:  10/22/16  Time of notification:  [id number]  Critical value read back: NA  Nurse who received alert:  B.Lavina Hamman RN  MD notified (1st page):  Lissa Hoard MD  Time of first page:  1630  Responding MD:  Lissa Hoard MD  Time MD responded:  [phone]

## 2016-10-22 NOTE — Progress Notes (Signed)
Patient ID: Charles Parker, male   DOB: 03/04/1946, 71 y.o.   MRN: [id number] BP (!) 143/86 (BP Location: Left Arm)   Pulse 89   Temp 99.3 F (37.4 C) (Oral)   Resp 17   Ht 6' (1.829 m)   Wt 78.9 kg (174 lb)   SpO2 100%   BMI 23.60 kg/m  Charles Parker will be going to the OR for decompression of the spinal cord at T8/9, and fixation. Risks and benefits have been discussed with the family paralysis, worsened strength,bleeding, infection, hardware failure,fusion failure, spinal cord damage, bowel and or bladder dysfunction, and other risks. Charles Parker understands given his progression on Abx, and dense plegia in his lower extremities that without this procedure I have no reasonable expectation that he would improve neurologically. He would like to proceed.

## 2016-10-22 NOTE — Anesthesia Postprocedure Evaluation (Signed)
Anesthesia Post Note  Patient: Charles Parker  Procedure(s) Performed: Procedure(s) (LRB): THORACIC EIGHT, THORACIC NINE DECOMPRESSION, POSTERIOR LATERAL ARTHRODESIS AND POSTERIOR FIXATION T6-T11 (N/A)  Patient location during evaluation: PACU Anesthesia Type: General Level of consciousness: awake and alert Pain management: pain level controlled Vital Signs Assessment: post-procedure vital signs reviewed and stable Respiratory status: spontaneous breathing, nonlabored ventilation, respiratory function stable and patient connected to nasal cannula oxygen Cardiovascular status: blood pressure returned to baseline and stable Postop Assessment: no signs of nausea or vomiting Anesthetic complications: no       Last Vitals:  Vitals:   10/22/16 2245 10/22/16 2300  BP: (!) 168/101 (!) 166/102  Pulse: 95 95  Resp: 17 (!) 0  Temp:      Last Pain:  Vitals:   10/22/16 2300  TempSrc:   PainSc: [place] DAVID

## 2016-10-22 NOTE — Patient Outreach (Addendum)
[facility]) Care Management  10/22/2016  Charles Parker March 05, 1946 [id number]   Assessment- CSW completed chart review as no return call from SNF took place. Patient was admitted into the hospital yesterday. Family feel that his condition has worsened since being at [facility] and he has not been physically able to participate in physical therapy. CSW will continue to follow case and await discharge.  Plan-CSW will send request to [facility] Liaison to follow patient.  [name], BSW, MSW, [email or web address] Phone: [phone] Fax: [phone]

## 2016-10-22 NOTE — NC FL2 (Signed)
[place] LEVEL OF CARE SCREENING TOOL     IDENTIFICATION  Patient Name: Charles Parker Birthdate: 1946/07/27 Sex: male Admission Date (Current Location): 10/21/2016  [facility] and [place] Number:  Herbalist and Address:  [facility] [place], [place] [address], [place], [place] [address]      Provider Number: [id number]  Attending Physician Name and Address:  Theodis Blaze, MD  Relative Name and Phone Number:  Jamarious, Shon; [phone] (home), [phone] (mobile);  Stolz,Stahle-Brother; [phone] (mobile)     Current Level of Care: Hospital Recommended Level of Care: [place] Prior Approval Number:    Date Approved/Denied:   PASRR Number: [id number]  Discharge Plan: SNF    Current Diagnoses: Patient Active Problem List   Diagnosis Date Noted  . Osteomyelitis of thoracic vertebra ([place]) 10/21/2016  . Diabetes mellitus, with long-term current use of insulin ([place]) 10/21/2016  . Dyslipidemia associated with type 2 diabetes mellitus ([place]) 10/21/2016  . Thoracic discitis 10/21/2016  . Abscess in epidural space of lumbar spine 10/21/2016  . Thoracic spine fracture ([place]) 10/21/2016  . DVT, lower extremity, proximal ([place]) 07/10/2014  . Benign prostatic hyperplasia (BPH) with straining on urination 06/20/2013  . 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 12/27/2011    Orientation RESPIRATION BLADDER Height & Weight     Self, Time, Situation, Place  Normal Continent Weight: 174 lb (78.9 kg) Height:  6' (182.9 cm)  BEHAVIORAL SYMPTOMS/MOOD NEUROLOGICAL BOWEL NUTRITION STATUS      Continent Diet (see DC summary)  AMBULATORY STATUS COMMUNICATION OF NEEDS Skin   Extensive Assist Verbally Surgical wounds                       Personal Care Assistance Level of Assistance  Bathing, Feeding, Dressing Bathing Assistance: Maximum  assistance Feeding assistance: Limited assistance Dressing Assistance: Maximum assistance     Functional Limitations Info  Sight, Hearing, Speech Sight Info: Impaired Hearing Info: Impaired Speech Info: Adequate    SPECIAL CARE FACTORS FREQUENCY  PT (By licensed PT), OT (By licensed OT)     PT Frequency: 5x a week OT Frequency: 5x a week            Contractures Contractures Info: Not present    Additional Factors Info  Code Status, Allergies Code Status Info: Full Allergies Info: Metformin, Codeine           Current Medications (10/22/2016):  This is the current hospital active medication list Current Facility-Administered Medications  Medication Dose Route Frequency Provider Last Rate Last Dose  . 0.9 %  sodium chloride infusion   Intravenous Continuous Theodis Blaze, MD      . acetaminophen (TYLENOL) tablet 650 mg  650 mg Oral Q6H PRN Theodis Blaze, MD       Or  . acetaminophen (TYLENOL) suppository 650 mg  650 mg Rectal Q6H PRN Theodis Blaze, MD      . cefTRIAXone (ROCEPHIN) 2 g in dextrose 5 % 50 mL IVPB  2 g Intravenous Q12H Theodis Blaze, MD   2 g at 10/22/16 0800  . HYDROcodone-acetaminophen (NORCO/VICODIN) 5-325 MG per tablet 1 tablet  1 tablet Oral Q6H PRN Theodis Blaze, MD   1 tablet at 10/22/16 0711  . insulin aspart (novoLOG) injection 0-15 Units  0-15 Units Subcutaneous TID WC Theodis Blaze, MD      . insulin glargine (LANTUS)  injection 25 Units  25 Units Subcutaneous QHS Theodis Blaze, MD   25 Units at 10/21/16 2247  . methocarbamol (ROBAXIN) tablet 500 mg  500 mg Oral Q8H PRN Theodis Blaze, MD      . multivitamin with minerals tablet 1 tablet  1 tablet Oral Daily Theodis Blaze, MD   1 tablet at 10/22/16 1011  . ondansetron (ZOFRAN) tablet 4 mg  4 mg Oral Q6H PRN Theodis Blaze, MD       Or  . ondansetron [facility]) injection 4 mg  4 mg Intravenous Q6H PRN Theodis Blaze, MD      . polyethylene glycol (MIRALAX / GLYCOLAX) packet 17 g  17 g Oral BID Theodis Blaze, MD   17 g at 10/22/16 1011  . simvastatin (ZOCOR) tablet 40 mg  40 mg Oral q1800 Theodis Blaze, MD      . tamsulosin [facility]) capsule 0.4 mg  0.4 mg Oral QPC breakfast Theodis Blaze, MD   0.4 mg at 10/22/16 1011  . vancomycin (VANCOCIN) IVPB 1000 mg/200 mL premix  1,000 mg Intravenous Q12H Tanna Furry, MD 200 mL/hr at 10/22/16 0814 1,000 mg at 10/22/16 [id number]     Discharge Medications: Please see discharge summary for a list of discharge medications.  Relevant Imaging Results:  Relevant Lab Results:   Additional Information [id number];   Lilly [place], LCSW

## 2016-10-22 NOTE — Progress Notes (Signed)
Patient's BP 180/100.  Dr. Conrad [place] notified.  Orders received for 5mg  iv labetalol to maintain systolic < 0000000.  Will continue to montior.

## 2016-10-22 NOTE — Op Note (Signed)
10/21/2016 - 10/22/2016  9:22 PM  PATIENT:  Charles Parker  71 y.o. male  PRE-OPERATIVE DIAGNOSIS:  T8,9 osteomyelitis, T8/9 discitis  POST-OPERATIVE DIAGNOSIS: same  PROCEDURE:  Procedure(s): THORACIC EIGHT, THORACIC NINE DECOMPRESSION, discetomy , POSTERIOR LATERAL ARTHRODESIS T6-T11  POSTERIOR FIXATION T6-T11 Segmental pedicle screws T6,7,10.11 Nuvasive   SURGEON: Surgeon(s): Ashok Pall, MD Kary Kos, MD  ASSISTANTS:Cram, Dominica Severin  ANESTHESIA:   general  EBL:  Total I/O In: 1000 [I.V.:1000] Out: 275 [Urine:125; Blood:150]  BLOOD ADMINISTERED:none  CELL SAVER GIVEN:none  COUNT:per nursing  DRAINS: none   SPECIMEN:  Source of Specimen:  T8, T9 lamina, T8/9 disc  DICTATION: DONI KUECHLE was taken to the operating room, intubated, and placed under a general anesthetic without difficulty. He was positioned prone on the [place] table with all pressure points properly padded. His back was prepped and draped in a sterile manner. I infiltrated 20cc lidocaine into the planned incision. I opened the incision with a 10 blade dissecting sharply to the thoracolumbar fascia. I used the monopolar cautery to expose the T6,7,8,9,10, and 11 lamina bilaterally. I placed self retaining retractors and started the posterior fixation. Dr. Saintclair Halsted and I placed pedicle screws at T6,7,10, and 11 bilaterally using fluoroscopy. Each pedicle was cannulated after a pilot hole was drilled. A bone probe was advanced through the pedicle with fluoroscopic guidance in both the AP and lateral planes. We then tapped the pedicle, assessed the integrity of each pedicle after being tapped, then placed the pedicle screws without incident.  We then decompressed the spinal canal at T8,9, and performed a T8/9 discetomy. The decompression was performed with Leksell rongeurs and a drill. The ligamentum flavum was removed along with the lateral bony margins with Kerrison punches. There was a fibrous membrane which  surrounded the dura which we dissected free from the dura and we sent multiple specimens to microbiology. We then identified the 8 and 9 pedicles on the right side, then the nerve roots 9,10. We opened the disc space and removed some of the remaining disc material. For the most part the disc space was empty. We did not encounter frank pus at any time. The arthrodesis was completed by using the drill to remove the cortical surface of the bone from T6-T11. I laid autograft and allograft on the lamina and lateral gutters of T8, and 9.  I then sized and placed rods through each set of screws. I secured them with locking caps, and used the torque limited screwdriver to tighten the caps.  I irrigated, and closed the wound after achieving hemostasis. I closed in layers approximating the thoracolumbar fascia, the subcutaneous, and subcuticular layers with vicryl sutures. I approximated the skin with staples. I applied a sterile dressing. Mr. Majkowski was moved to the OR stretcher and subsequently extubated.   PLAN OF CARE: Admit to inpatient   PATIENT DISPOSITION:  PACU - hemodynamically stable.   Delay start of Pharmacological VTE agent (>24hrs) due to surgical blood loss or risk of bleeding:  yes

## 2016-10-22 NOTE — Transfer of Care (Signed)
Immediate Anesthesia Transfer of Care Note  Patient: Charles Parker  Procedure(s) Performed: Procedure(s): THORACIC EIGHT, THORACIC NINE DECOMPRESSION, POSTERIOR LATERAL ARTHRODESIS AND POSTERIOR FIXATION T6-T11 (N/A)  Patient Location: PACU  Anesthesia Type:General  Level of Consciousness: sedated  Airway & Oxygen Therapy: Patient Spontanous Breathing and Patient connected to nasal cannula oxygen  Post-op Assessment: Report given to RN and Post -op Vital signs reviewed and stable  Post vital signs: Reviewed and stable  Last Vitals:  Vitals:   10/22/16 0717 10/22/16 1500  BP: (!) 142/81 (!) 143/86  Pulse: 82 89  Resp: 17 17  Temp: 36.3 C 37.4 C    Last Pain:  Vitals:   10/22/16 1500  TempSrc: Oral  PainSc:          Complications: No apparent anesthesia complications

## 2016-10-23 DIAGNOSIS — Z9889 Other specified postprocedural states: Secondary | ICD-10-CM

## 2016-10-23 DIAGNOSIS — B9689 Other specified bacterial agents as the cause of diseases classified elsewhere: Secondary | ICD-10-CM

## 2016-10-23 DIAGNOSIS — E119 Type 2 diabetes mellitus without complications: Secondary | ICD-10-CM

## 2016-10-23 LAB — BASIC METABOLIC PANEL
Anion gap: 9 (ref 5–15)
BUN: 8 mg/dL (ref 6–20)
CHLORIDE: 102 mmol/L (ref 101–111)
CO2: 26 mmol/L (ref 22–32)
Calcium: 8.9 mg/dL (ref 8.9–10.3)
Creatinine, Ser: 0.67 mg/dL (ref 0.61–1.24)
GFR calc Af Amer: >60 mL/min (ref >60)
GFR calc non Af Amer: >60 mL/min (ref >60)
Glucose, Bld: 209 mg/dL — ABNORMAL HIGH (ref 65–99)
POTASSIUM: 3.9 mmol/L (ref 3.5–5.1)
SODIUM: 137 mmol/L (ref 135–145)

## 2016-10-23 LAB — CBC
HEMATOCRIT: 32.9 % — AB (ref 39.0–52.0)
HEMOGLOBIN: 10.3 g/dL — AB (ref 13.0–17.0)
MCH: 27.2 pg (ref 26.0–34.0)
MCHC: 31.3 g/dL (ref 30.0–36.0)
MCV: 87 fL (ref 78.0–100.0)
Platelets: 225 10*3/uL (ref 150–400)
RBC: 3.78 MIL/uL — AB (ref 4.22–5.81)
RDW: 14.1 % (ref 11.5–15.5)
WBC: 6.9 10*3/uL (ref 4.0–10.5)

## 2016-10-23 LAB — GLUCOSE, CAPILLARY
GLUCOSE-CAPILLARY: 136 mg/dL — AB (ref 65–99)
GLUCOSE-CAPILLARY: 280 mg/dL — AB (ref 65–99)
Glucose-Capillary: 175 mg/dL — ABNORMAL HIGH (ref 65–99)

## 2016-10-23 MED ORDER — PHENOL 1.4 % MT LIQD: 1.0000 | OROMUCOSAL | Status: DC | PRN

## 2016-10-23 MED ORDER — MORPHINE SULFATE (PF) 2 MG/ML IV SOLN
1.0000 mg | INTRAVENOUS | Status: DC | PRN
  Administered 2016-10-23 – 2016-10-26 (×7): 2 mg via INTRAVENOUS
  Filled 2016-10-23 (×7): qty 1

## 2016-10-23 MED ORDER — LABETALOL HCL 5 MG/ML IV SOLN
INTRAVENOUS | Status: AC
  Administered 2016-10-23 (×2): 5 mg
  Filled 2016-10-23: qty 4

## 2016-10-23 MED ORDER — DOCUSATE SODIUM 100 MG PO CAPS
100.0000 mg | ORAL_CAPSULE | Freq: Two times a day (BID) | ORAL | Status: DC
  Administered 2016-10-23 – 2016-10-27 (×8): 100 mg via ORAL
  Filled 2016-10-23 (×8): qty 1

## 2016-10-23 MED ORDER — MENTHOL 3 MG MT LOZG: 1.0000 | LOZENGE | OROMUCOSAL | Status: DC | PRN

## 2016-10-23 MED ORDER — BISACODYL 5 MG PO TBEC: 5.0000 mg | DELAYED_RELEASE_TABLET | Freq: Every day | ORAL | Status: DC | PRN

## 2016-10-23 MED ORDER — SODIUM CHLORIDE 0.9% FLUSH: 3.0000 mL | INTRAVENOUS | Status: DC | PRN

## 2016-10-23 MED ORDER — SODIUM CHLORIDE 0.9% FLUSH
3.0000 mL | Freq: Two times a day (BID) | INTRAVENOUS | Status: DC
  Administered 2016-10-25 – 2016-10-26 (×3): 3 mL via INTRAVENOUS

## 2016-10-23 MED ORDER — DEXAMETHASONE 4 MG PO TABS
4.0000 mg | ORAL_TABLET | Freq: Once | ORAL | Status: AC
  Administered 2016-10-23: 4 mg via ORAL
  Filled 2016-10-23: qty 1

## 2016-10-23 MED ORDER — ZOLPIDEM TARTRATE 5 MG PO TABS: 5.0000 mg | ORAL_TABLET | Freq: Every evening | ORAL | Status: DC | PRN

## 2016-10-23 MED ORDER — DEXAMETHASONE SODIUM PHOSPHATE 4 MG/ML IJ SOLN: 8.0000 mg | Freq: Once | INTRAMUSCULAR | Status: AC

## 2016-10-23 MED ORDER — SODIUM CHLORIDE 0.9 % IV SOLN: 250.0000 mL | INTRAVENOUS | Status: DC

## 2016-10-23 MED ORDER — MAGNESIUM CITRATE PO SOLN: 1.0000 | Freq: Once | ORAL | Status: DC | PRN

## 2016-10-23 NOTE — Progress Notes (Signed)
Notified [employer] to bring TLSO clamshell brace.

## 2016-10-23 NOTE — Progress Notes (Signed)
Orthopedic Tech Progress Note Patient Details:  Charles Parker Dec 29, 1945 [id number] Brace completed by [employer] Patient ID: [name], male   DOB: 09/28/1946, 71 y.o.   MRN: [id number]   [name] 10/23/2016, 3:10 PM

## 2016-10-23 NOTE — Progress Notes (Signed)
Orthopedic Tech Progress Note Patient Details:  Charles Parker Jan 23, 1946 [id number]  Patient ID: Charles Parker, male   DOB: 1945-11-17, 71 y.o.   MRN: [id number]   Charles Parker 10/23/2016, 1:12 PM Called in [employer] brace order; spoke with Mortimer Fries

## 2016-10-23 NOTE — Evaluation (Signed)
Occupational Therapy Evaluation Patient Details Name: Charles Parker MRN: [id number] DOB: 1946-02-23 Today's Date: 10/23/2016    History of Present Illness 71 yo male s/p T8, T9 discitis with osteomyelitis and epidural abscess. L4-5 - small abscess noted in new area though other areas resolved which is the site of the previously identified epidural abscess. T6-11 decompression PLA and fixation with I &D. recent admission 12/24 due to endocarditis.   Clinical Impression   Patient is s/p Fusion T6-11  Surgery with complex medical hx resulting in functional limitations due to the deficits listed below (see OT problem list). Chart speaks to central cord injury with progressive weakness over last month x1 . Pt prior to admission 1 month ago was independent with adls and iadls. 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 Patient will benefit from skilled OT acutely to increase independence and safety with ADLS to allow discharge CIR.     Follow Up Recommendations  CIR    Equipment Recommendations  Other (comment) (TBA)    Recommendations for Other Services Rehab consult     Precautions / Restrictions Precautions Precautions: Fall;Back Precaution Comments: watch tachycardia      Mobility Bed Mobility Overal bed mobility: Needs Assistance Bed Mobility: Rolling;Supine to Sit Rolling: Max assist   Supine to sit: +2 for physical assistance;Mod assist Sit to supine: Max assist;+2 for physical assistance   General bed mobility comments: requires (A) for BIL LE in and out of bed. Pt requires max cues for sequence. pt initiating and progressing with therapist. pt limted EOB due to incr in HR 138  Transfers                 General transfer comment: not attempted due to tachycardia    Balance                                            ADL Overall ADL's : Needs assistance/impaired Eating/Feeding: Set up   Grooming: Wash/dry hands;Wash/dry  face;Set up;Bed level   Upper Body Bathing: Moderate assistance   Lower Body Bathing: Total assistance                         General ADL Comments: pt tolerated EOB this session for ~5 minutes     Vision     Perception     Praxis      Pertinent Vitals/Pain Pain Assessment: Faces Faces Pain Scale: Hurts even more Pain Location: back Pain Descriptors / Indicators: Grimacing Pain Intervention(s): Monitored during session;Premedicated before session;Repositioned     Hand Dominance Right   Extremity/Trunk Assessment Upper Extremity Assessment Upper Extremity Assessment: Generalized weakness   Lower Extremity Assessment Lower Extremity Assessment: Defer to PT evaluation   Cervical / Trunk Assessment Cervical / Trunk Assessment: Other exceptions (s/p surg)   Communication Communication Communication: No difficulties   Cognition Arousal/Alertness: Awake/alert Behavior During Therapy: WFL for tasks assessed/performed Overall Cognitive Status: Within Functional Limits for tasks assessed                     General Comments       Exercises       Shoulder Instructions      Home Living Family/patient expects to be discharged to:: Inpatient rehab  Additional Comments: pt 1 month ago was independent with all adls and driving. pt currently still has an apartment      Prior Functioning/Environment Level of Independence: Needs assistance    ADL's / Homemaking Assistance Needed: prior to 1 month ago was independent. pt with progressive weakness with documentation in chart currently for central cord by Neurology            OT Problem List: Decreased strength;Decreased range of motion;Decreased activity tolerance;Impaired balance (sitting and/or standing);Decreased safety awareness;Decreased knowledge of use of DME or AE;Decreased knowledge of precautions;Cardiopulmonary status limiting activity;Impaired  sensation;Pain   OT Treatment/Interventions: Self-care/ADL training;Therapeutic exercise;Neuromuscular education;DME and/or AE instruction;Therapeutic activities;Patient/family education;Balance training    OT Goals(Current goals can be found in the care plan section) Acute Rehab OT Goals Patient Stated Goal: back home OT Goal Formulation: With patient Time For Goal Achievement: 11/06/16 Potential to Achieve Goals: Good  OT Frequency: Min 3X/week   Barriers to D/C:            Co-evaluation              End of Session Equipment Utilized During Treatment:  (pending back bracea rrival) Nurse Communication: Mobility status;Need for lift equipment;Precautions  Activity Tolerance: Patient tolerated treatment well;Patient limited by pain;Treatment limited secondary to medical complications (Comment) (tachycardia) Patient left: in bed;with call bell/phone within reach;with chair alarm set;with SCD's reapplied   Time: [id number] OT Time Calculation (min): 24 min Charges:  OT General Charges $OT Visit: 1 Procedure OT Evaluation $OT Eval High Complexity: 1 Procedure G-Codes:    Peri Maris 11-19-2016, 3:30 PM  Jeri [place]   OTR/L Pager: [phone] Office: [phone] .

## 2016-10-23 NOTE — Evaluation (Signed)
Physical Therapy Evaluation Patient Details Name: Charles Parker MRN: [id number] DOB: [birth year] Today's Date: 10/23/2016   History of Present Illness  71 yo male s/p T8, T9 discitis with osteomyelitis and epidural abscess. L4-5 - small abscess noted in new area though other areas resolved which is the site of the previously identified epidural abscess. T6-11 decompression PLA and fixation with I &D. recent admission 12/24 due to endocarditis.  Clinical Impression  Pt admitted with above diagnosis. Pt currently with functional limitations due to the deficits listed below (see PT Problem List). Pt currently requiring max A +2 for bed mobility. OOB mobility limited by tachycardia. Pt was independent prior to one month ago, would benefit from CIR stay. Recommend Bilateral PRAFO's for maintaining ankle ROM.  Pt will benefit from skilled PT to increase their independence and safety with mobility to allow discharge to the venue listed below.       Follow Up Recommendations CIR    Equipment Recommendations  None recommended by PT    Recommendations for Other Services Rehab consult     Precautions / Restrictions Precautions Precautions: Fall;Back Precaution Comments: watch tachycardia Restrictions Weight Bearing Restrictions: No      Mobility  Bed Mobility Overal bed mobility: Needs Assistance Bed Mobility: Rolling;Supine to Sit;Sit to Supine Rolling: Max assist   Supine to sit: +2 for physical assistance;Max assist Sit to supine: +2 for physical assistance;Max assist   General bed mobility comments: requires (A) for BIL LE in and out of bed. Pt requires max cues for sequence. pt initiating and progressing with therapist. pt limted EOB due to incr in HR 138  Transfers                 General transfer comment: not attempted due to tachycardia  Ambulation/Gait             General Gait Details: pt unable due to weakness and tachycardia  Stairs             Wheelchair Mobility    Modified Rankin (Stroke Patients Only)       Balance Overall balance assessment: Needs assistance Sitting-balance support: Feet supported Sitting balance-Leahy Scale: Fair Sitting balance - Comments: able to maintain balance EOB but insecure as he scooted to EOB with fwd lean, min-guard for safety                                     Pertinent Vitals/Pain Pain Assessment: Faces Faces Pain Scale: Hurts even more Pain Location: back Pain Descriptors / Indicators: Grimacing Pain Intervention(s): Monitored during session;Repositioned;Premedicated before session;Limited activity within patient's tolerance    Home Living Family/patient expects to be discharged to:: Inpatient rehab Living Arrangements: Alone Available Help at Discharge: Family;Available PRN/intermittently Type of Home: Apartment Home Access: Level entry     Home Layout: One level Home Equipment: Cane - single point Additional Comments: pt 1 month ago was independent with all adls and driving. pt currently still has an apartment    Prior Function Level of Independence: Needs assistance   Gait / Transfers Assistance Needed: uses cane to be independent  ADL's / Homemaking Assistance Needed: prior to 1 month ago was independent. pt with progressive weakness with documentation in chart currently for central cord by Neurology        Hand Dominance   Dominant Hand: Right    Extremity/Trunk Assessment   Upper Extremity Assessment Upper Extremity Assessment:  Generalized weakness    Lower Extremity Assessment Lower Extremity Assessment: Generalized weakness;RLE deficits/detail;LLE deficits/detail RLE Deficits / Details: df 1/5, hip flex 1/5, knee ext 1/5 RLE Sensation: decreased light touch;decreased proprioception RLE Coordination: decreased gross motor LLE Deficits / Details: df 1/5, knee ext 1/5, knee flex 1/5 LLE Sensation: decreased light touch;decreased  proprioception LLE Coordination: decreased gross motor    Cervical / Trunk Assessment Cervical / Trunk Assessment: Other exceptions (s/p surgery)  Communication   Communication: No difficulties  Cognition Arousal/Alertness: Awake/alert Behavior During Therapy: WFL for tasks assessed/performed Overall Cognitive Status: Within Functional Limits for tasks assessed                      General Comments General comments (skin integrity, edema, etc.): HR 130 bpm in supine, as high as 138 bpm in sitting. O2 sats 100 %. Pt would benefit from B PRAFO's to preserve ankle ROM    Exercises General Exercises - Lower Extremity Ankle Circles/Pumps: Both;10 reps;Supine;AAROM Heel Slides: AAROM;Both;5 reps;Supine   Assessment/Plan    PT Assessment Patient needs continued PT services  PT Problem List Decreased strength;Decreased activity tolerance;Decreased range of motion;Decreased balance;Decreased mobility;Decreased knowledge of precautions;Pain          PT Treatment Interventions DME instruction;Gait training;Functional mobility training;Therapeutic activities;Therapeutic exercise;Balance training;Patient/family education;Neuromuscular re-education    PT Goals (Current goals can be found in the Care Plan section)  Acute Rehab PT Goals Patient Stated Goal: back home PT Goal Formulation: With patient Time For Goal Achievement: 11/06/16 Potential to Achieve Goals: Fair    Frequency Min 3X/week   Barriers to discharge Decreased caregiver support lives in senior apt    Co-evaluation PT/OT/SLP Co-Evaluation/Treatment: Yes Reason for Co-Treatment: Complexity of the patient's impairments (multi-system involvement);For patient/therapist safety;Necessary to address cognition/behavior during functional activity PT goals addressed during session: Mobility/safety with mobility;Balance         End of Session   Activity Tolerance: Patient tolerated treatment well Patient left: in  bed;with call bell/phone within reach Nurse Communication: Mobility status         Time: 1340-1405 PT Time Calculation (min) (ACUTE ONLY): 25 min   Charges:   PT Evaluation $PT Eval Moderate Complexity: 1 Procedure     PT G Codes:      Leighton Roach, PT  Acute Rehab Services  [place] 10/23/2016, 4:02 PM

## 2016-10-23 NOTE — Progress Notes (Signed)
PT Cancellation Note  Patient Details Name: Charles Parker MRN: [id number] DOB: 10-18-1945   Cancelled Treatment:    Reason Eval/Treat Not Completed: Patient not medically ready. PT/OT ordered but pt currently on strict bedrest. Please advance activity orders if pt appropriate for PT/OT evals.    Mono L [name] 10/23/2016, 8:18 AM

## 2016-10-23 NOTE — Progress Notes (Signed)
Rehab Admissions Coordinator Note:  Patient was screened by Cleatrice Burke for appropriateness for an Inpatient Acute Rehab Consult per PT and OT recommendations.  At this time, we are recommending an inpt rehab consult.. Please place order.  Cleatrice Burke 10/23/2016, 9:19 PM  I can be reached at [phone].

## 2016-10-23 NOTE — Progress Notes (Signed)
Patient ID: Charles Parker, male   DOB: [birth year], 71 y.o.   MRN: [id number] Patient feels it is doing better back pain well-controlled increased movement lower 70s per patient report.  Incision clean dry and intact to out of 5 movement lower extremities bilaterally  Mobilize with physical and occupational therapy.

## 2016-10-23 NOTE — Progress Notes (Signed)
Patient ID: Charles Parker, male   DOB: December 14, 1945, 71 y.o.   MRN: [id number]    PROGRESS NOTE    Charles Parker  [id number] DOB: [birth year] DOA: 10/21/2016  PCP: Scarlette Calico, MD   HPI: 71 y.o. male with medical history significant for recent hospitalization 09/26/16 - 10/05/16 for sepsis due to E.Coli bacteremia and pyelonephritis as well as L4, L5 epidural abscess with osteomyelitis and phlegmon and was supposed to be on rocephin through 11/07/16. TEE was done 10/05/2016 and was negative for endocarditis. This time pt presented with progressive LE weakness but no fevers, no specific urinary concerns, no chest pain, no dyspnea, no abd concerns.   Assessment and plan:  Osteomyelitis of thoracic vertebra [facility]) /  Thoracic discitis / Abscess in epidural space of lumbar spine / Thoracic spine fracture  - MRI thoracic and lumbar spine consistent with discitis and osteomyelitis with endplate destructive change at the T8-9 interspace. Rim enhancing fluid within the anterior aspect of the intervertebral disc consistent with abscess. New small fluid collection posterior to the left facet joint worrisome for abscess, possible septic facet joints but could be due to degenerative disease.  - Also seen on CT scan complex fracture at T8-9 with endplate destruction of both levels and possible discitis, osteomyelitis with extensive paraspinal density potentially from hematoma or phlegmon.  - s/p thoracic 8th, 9th, decompression, discectomy, post lateral arthrodesis T6-T11, post fixation t6-T11, post op day #1 - stable this AM in ICU - continue vancomycin day #2 and continue Rocephin as per previous home regimen - blood cultures obtained on admission - ID team consulted for assistance, appreciate help   Active Problems:   Benign prostatic hyperplasia (BPH) with straining on urination - Continue Flomax     DVT, lower extremity, proximal (HCC) - Xarelto on hold post op, resume if Hg stable and once  neurosurgery team says its OK    Diabetes mellitus, with long-term current use of insulin (HCC) - Continue Lantus 25 units at bedtime along with sliding scale insulin - A1C 7.3    Dyslipidemia associated with type 2 diabetes mellitus ([place])  - Resume statin therapy   DVT prophylaxis: Xarelto on hold in an anticipation an intervention  Code Status: Full  Family Communication: Patient at bedside  Disposition Plan: Keep in ICU for now   Consultants:   Neurosurgery   ID  Procedures:   10/22/2016 - Thoracic 8th, 9th, decompression, discectomy, post lateral arthrodesis T6-T11, post fixation t6-T11  Antimicrobials:   Vancomycin 1/18 -->  Rocephin resumed from previous home regimen --> 11/07/2016   Subjective: No events overnight.   Objective: Vitals:   10/23/16 0700 10/23/16 0800 10/23/16 0900 10/23/16 1000  BP: (!) 147/90 (!) 170/98 (!) 164/97 (!) 152/95  Pulse: (!) 105 (!) 113 (!) 111 (!) 114  Resp: (!) 26 (!) 27 (!) 26 (!) 28  Temp:  99.3 F (37.4 C)    TempSrc:  Oral    SpO2: 98% 97% 95% 95%  Weight:      Height:        Intake/Output Summary (Last 24 hours) at 10/23/16 1023 Last data filed at 10/23/16 0830  Gross per 24 hour  Intake          4166.17 ml  Output             3834 ml  Net           332.17 ml   Filed Weights   10/21/16 1204 10/23/16 0029  Weight: 78.9 kg (174 lb) 74.8 kg (164 lb 14.5 oz)    Examination:  General exam: Appears calm and comfortable  Respiratory system: Respiratory effort normal. Cardiovascular system: S1 & S2 heard, RRR. No JVD, murmurs, rubs, gallops or clicks. No pedal edema. Gastrointestinal system: Abdomen is nondistended, soft and nontender. No organomegaly or masses felt. Normal bowel sounds heard. Central nervous system: Alert and oriented. LE weakness bilaterally 2/5  Data Reviewed: I have personally reviewed following labs and imaging studies  CBC:  Recent Labs Lab 10/21/16 1258 10/22/16 0358 10/23/16 0305    WBC 6.5 6.4 6.9  NEUTROABS 4.6  --   --   HGB 10.0* 9.9* 10.3*  HCT 32.1* 31.9* 32.9*  MCV 88.4 88.1 87.0  PLT 236 220 [id number]   Basic Metabolic Panel:  Recent Labs Lab 10/21/16 1258 10/22/16 0358 10/23/16 0305  NA 138 140 137  K 3.7 3.9 3.9  CL 103 102 102  CO2 26 28 26   GLUCOSE 113* 91 209*  BUN 11 9 8   CREATININE 0.76 0.70 0.67  CALCIUM 9.1 9.1 8.9   Liver Function Tests:  Recent Labs Lab 10/21/16 1258  AST 22  ALT 16*  ALKPHOS 56  BILITOT 0.3  PROT 7.4  ALBUMIN 2.5*    Recent Labs Lab 10/21/16 1258  LIPASE 15   HbA1C:  Recent Labs  10/21/16 1840  HGBA1C 7.3*   CBG:  Recent Labs Lab 10/22/16 1837 10/22/16 1926 10/22/16 2031 10/22/16 2124 10/23/16 0759  GLUCAP 127* 136* 158* 157* 175*   Urine analysis:    Component Value Date/Time   COLORURINE YELLOW 10/21/2016 1312   APPEARANCEUR TURBID (A) 10/21/2016 1312   LABSPEC 1.015 10/21/2016 1312   PHURINE 8.0 10/21/2016 1312   GLUCOSEU NEGATIVE 10/21/2016 1312   GLUCOSEU NEGATIVE 09/08/2016 1513   HGBUR MODERATE (A) 10/21/2016 1312   BILIRUBINUR NEGATIVE 10/21/2016 1312   BILIRUBINUR negative 08/17/2016 1359   KETONESUR NEGATIVE 10/21/2016 1312   PROTEINUR NEGATIVE 10/21/2016 1312   UROBILINOGEN 0.2 09/08/2016 1513   NITRITE NEGATIVE 10/21/2016 1312   LEUKOCYTESUR NEGATIVE 10/21/2016 1312    Recent Results (from the past 240 hour(s))  Surgical pcr screen     Status: None   Collection Time: 10/22/16  6:57 AM  Result Value Ref Range Status   MRSA, PCR NEGATIVE NEGATIVE Final   Staphylococcus aureus NEGATIVE NEGATIVE Final    Radiology Studies: Dg Thoracic Spine 2 View  Result Date: 10/22/2016 CLINICAL DATA:  Thoracic decompression and posterior-lateral arthrodesis. EXAM: THORACIC SPINE 2 VIEWS; DG C-ARM 61-120 MIN COMPARISON:  MRI thoracic spine January 18th. FLUOROSCOPY TIME:  62 seconds FINDINGS: Three intraoperative fluoroscopic spot views submitted, interpreting radiologist was not  present at time of procedure. Initial radiograph demonstrates pedicle screws at 4 levels. Last image demonstrates two consecutive level pedicle screw fixation. IMPRESSION: Thoracic fusion. Electronically Signed   By: Elon Alas M.D.   On: 10/22/2016 21:23   Mr Thoracic Spine W Wo Contrast  Result Date: 10/21/2016 CLINICAL DATA:  Diabetic patient with a recent history of discitis and epidural abscess. Increasing left worse than right lower extremity weakness. EXAM: MRI THORACIC AND LUMBAR SPINE WITHOUT AND WITH CONTRAST TECHNIQUE: Multiplanar and multiecho pulse sequences of the thoracic and lumbar spine were obtained without and with intravenous contrast. CONTRAST:  17 ml MULTIHANCE GADOBENATE DIMEGLUMINE 529 MG/ML IV SOLN COMPARISON:  MRI lumbar spine 09/30/2016. CT abdomen and pelvis this same day. FINDINGS: MRI THORACIC SPINE FINDINGS Alignment:  Maintained. Vertebrae: Endplate destructive change  is seen about the T8-9 level with marked marrow edema and enhancement throughout both vertebral bodies consistent with discitis and osteomyelitis. Edema and enhancement are also present of the T8 and T9 facets and pedicles. Extensive paraspinous inflammatory change is present about the T8 and T9 vertebral bodies. Cord:  Demonstrates normal signal. Paraspinal and other soft tissues: Small bilateral pleural effusions are seen. Disc levels: T8-9: No epidural abscess is seen. There is extensive enhancement of epidural soft tissues and a disc bulge deforming the cord at T8-9. Rim enhancing fluid within the anterior aspect of the intervertebral disc space consistent with abscess measures 1.8 cm transverse by 0.9 cm AP by 0.9 cm craniocaudal. T5-6 and T6-7: Small left paracentral protrusions without central canal or foraminal stenosis. MRI LUMBAR SPINE FINDINGS Segmentation:  Standard. Alignment: Trace retrolisthesis L1 on L2 is unchanged. Otherwise maintained. Vertebrae: Heterogeneous marrow signal throughout is  again seen. No fracture. Conus medullaris: Extends to the T12-L1 level and appears normal. Paraspinal and other soft tissues: Multiple bilateral renal cysts are again seen. Disc levels: L1-2: Trace retrolisthesis.  Central canal and foramina are open. L2-3:  Mild facet degenerative change.  Otherwise negative. L3-4: Mild to moderate facet arthropathy and is very small disc bulge. The central canal and foramina are open. L4-5: Advanced bilateral facet degenerative change is seen. Small epidural fluid collection subjacent to the ligamentum flavum on the right is no longer visualized. Fluid collection in the posterior paraspinous musculature on the left adjacent to the L4 spinous process has also resolved. There is marrow edema and mild enhancement in the L4 and L5 pedicles and facets, not markedly changed. A new small fluid collection off the posterior aspect of the left facet joint measures 0.6 cm transverse by 0.9 cm AP by 0.7 cm craniocaudal. Posterior epidural soft tissues at this level are edematous and enhancing. L5-S1: Facet arthropathy. Small annular fissure and shallow bulge. The central canal and foramina are open. IMPRESSION: Findings consistent with discitis and osteomyelitis with endplate destructive change about the T8-9 interspace. Extensive paraspinous inflammatory change extends into the epidural space and there is a disc bulge at this level mildly deforming the cord. No epidural abscess is identified. Rim enhancing fluid within the anterior aspect of the intervertebral disc space as described above is consistent with abscess. Two small fluid collections at L4-5 seen on the prior examination have resolved. However, there is a new small fluid collection posterior to the left facet joint worrisome for abscess as described above. Marrow edema and enhancement in the pedicles and facets bilaterally is most worrisome for septic facet joints but could be due to degenerative disease. The central canal is open  at L4-5. Electronically Signed   By: Inge Rise M.D.   On: 10/21/2016 16:23   Mr Lumbar Spine W Wo Contrast  Result Date: 10/21/2016 CLINICAL DATA:  Diabetic patient with a recent history of discitis and epidural abscess. Increasing left worse than right lower extremity weakness. EXAM: MRI THORACIC AND LUMBAR SPINE WITHOUT AND WITH CONTRAST TECHNIQUE: Multiplanar and multiecho pulse sequences of the thoracic and lumbar spine were obtained without and with intravenous contrast. CONTRAST:  17 ml MULTIHANCE GADOBENATE DIMEGLUMINE 529 MG/ML IV SOLN COMPARISON:  MRI lumbar spine 09/30/2016. CT abdomen and pelvis this same day. FINDINGS: MRI THORACIC SPINE FINDINGS Alignment:  Maintained. Vertebrae: Endplate destructive change is seen about the T8-9 level with marked marrow edema and enhancement throughout both vertebral bodies consistent with discitis and osteomyelitis. Edema and enhancement are also present of  the T8 and T9 facets and pedicles. Extensive paraspinous inflammatory change is present about the T8 and T9 vertebral bodies. Cord:  Demonstrates normal signal. Paraspinal and other soft tissues: Small bilateral pleural effusions are seen. Disc levels: T8-9: No epidural abscess is seen. There is extensive enhancement of epidural soft tissues and a disc bulge deforming the cord at T8-9. Rim enhancing fluid within the anterior aspect of the intervertebral disc space consistent with abscess measures 1.8 cm transverse by 0.9 cm AP by 0.9 cm craniocaudal. T5-6 and T6-7: Small left paracentral protrusions without central canal or foraminal stenosis. MRI LUMBAR SPINE FINDINGS Segmentation:  Standard. Alignment: Trace retrolisthesis L1 on L2 is unchanged. Otherwise maintained. Vertebrae: Heterogeneous marrow signal throughout is again seen. No fracture. Conus medullaris: Extends to the T12-L1 level and appears normal. Paraspinal and other soft tissues: Multiple bilateral renal cysts are again seen. Disc levels:  L1-2: Trace retrolisthesis.  Central canal and foramina are open. L2-3:  Mild facet degenerative change.  Otherwise negative. L3-4: Mild to moderate facet arthropathy and is very small disc bulge. The central canal and foramina are open. L4-5: Advanced bilateral facet degenerative change is seen. Small epidural fluid collection subjacent to the ligamentum flavum on the right is no longer visualized. Fluid collection in the posterior paraspinous musculature on the left adjacent to the L4 spinous process has also resolved. There is marrow edema and mild enhancement in the L4 and L5 pedicles and facets, not markedly changed. A new small fluid collection off the posterior aspect of the left facet joint measures 0.6 cm transverse by 0.9 cm AP by 0.7 cm craniocaudal. Posterior epidural soft tissues at this level are edematous and enhancing. L5-S1: Facet arthropathy. Small annular fissure and shallow bulge. The central canal and foramina are open. IMPRESSION: Findings consistent with discitis and osteomyelitis with endplate destructive change about the T8-9 interspace. Extensive paraspinous inflammatory change extends into the epidural space and there is a disc bulge at this level mildly deforming the cord. No epidural abscess is identified. Rim enhancing fluid within the anterior aspect of the intervertebral disc space as described above is consistent with abscess. Two small fluid collections at L4-5 seen on the prior examination have resolved. However, there is a new small fluid collection posterior to the left facet joint worrisome for abscess as described above. Marrow edema and enhancement in the pedicles and facets bilaterally is most worrisome for septic facet joints but could be due to degenerative disease. The central canal is open at L4-5. Electronically Signed   By: Inge Rise M.D.   On: 10/21/2016 16:23   Dg C-arm 1-60 Min  Result Date: 10/22/2016 CLINICAL DATA:  Thoracic decompression and  posterior-lateral arthrodesis. EXAM: THORACIC SPINE 2 VIEWS; DG C-ARM 61-120 MIN COMPARISON:  MRI thoracic spine January 18th. FLUOROSCOPY TIME:  62 seconds FINDINGS: Three intraoperative fluoroscopic spot views submitted, interpreting radiologist was not present at time of procedure. Initial radiograph demonstrates pedicle screws at 4 levels. Last image demonstrates two consecutive level pedicle screw fixation. IMPRESSION: Thoracic fusion. Electronically Signed   By: Elon Alas M.D.   On: 10/22/2016 21:23   Ct Renal Stone Study  Addendum Date: 10/21/2016   ADDENDUM REPORT: 10/21/2016 13:58 ADDENDUM: The original report was by Dr. Van Clines. The following addendum is by Dr. Van Clines: These results were called by telephone at the time of interpretation on 10/21/2016 at 1:55 pm to Dr. Tanna Furry , who verbally acknowledged these results. Electronically Signed   By: Thayer Jew  Janeece Fitting M.D.   On: 10/21/2016 13:58   Result Date: 10/21/2016 CLINICAL DATA:  Abdominal pain and low back pain over the last 2-3 weeks. EXAM: CT ABDOMEN AND PELVIS WITHOUT CONTRAST TECHNIQUE: Multidetector CT imaging of the abdomen and pelvis was performed following the standard protocol without IV contrast. COMPARISON:  None. FINDINGS: Lower chest: Atelectasis in the right middle lobe with subsegmental atelectasis in both lower lobes. Paraspinal edema/ hematoma at the T8- 9 level, please see the musculoskeletal section below for further detail. Hepatobiliary: Small dependent densities in the gallbladder compatible with gallstones. Stable small hypodense 5 mm lesion in segment 3 of the liver. Pancreas: Unremarkable Spleen: Unremarkable Adrenals/Urinary Tract: Adrenal glands normal. Hypodense renal lesions are observed, most of fluid density but with a 5.2 by 3.7 cm mildly complex upper pole exophytic lesion with internal density 15 Hounsfield units. The other cysts are at about 2 Hounsfield units. Urinary bladder  decompressed by a Foley catheter, no urinary tract stones identified. Stomach/Bowel: Appendix normal. Postoperative findings of the rectosigmoid junction. Prominent stool throughout the colon favors constipation. Several scattered descending colon and sigmoid diverticula. Vascular/Lymphatic: Mild aortoiliac atherosclerotic vascular disease. Reproductive: Unremarkable Other: No supplemental non-categorized findings. Musculoskeletal: There is an apparent fracture of the spinous process of T8 with scalloped irregular endplate destructive findings at the T8-9 level, compression fracture of T 8, posterior osseous ridging, and striking bilateral osseous foraminal stenosis at this level. I cannot exclude discitis-osteomyelitis and on MRI of the thoracic spine with attention to this region should be considered. There is considerable paraspinal edema/hematoma. There is arthropathy of the adjacent costovertebral joints. Bridging spurring of the left sacroiliac joint. Mild spurring of both hips. Lumbar degenerative disc disease and facet arthropathy causing impingement at L3-4, L4-5, and L5-S1. IMPRESSION: 1. Complex fracture at T8-9 with endplate destruction of both levels and possible discitis -osteomyelitis, with extensive paraspinal density potentially from hematoma or phlegmon. There is both posterior osseous ridging and foraminal impingement at this level. I also suspect a fracture of the spinous process of T8. Given these findings, consider thoracic spine MRI with and without contrast for further characterization. 2. Scattered atelectasis in the lung bases. 3. Cholelithiasis. 4. Bilateral renal cysts. Mildly complex right kidney upper pole lesion is nonspecific but probably a complex cyst. 5.  Prominent stool throughout the colon favors constipation. 6. Lower lumbar impingement due to degenerative disc disease and facet arthropathy. Radiology assistant personnel have been notified to put me in telephone contact with the  referring physician or the referring physician's clinical representative in order to discuss these findings. Once this communication is established I will issue an addendum to this report for documentation purposes. Electronically Signed: By: Van Clines M.D. On: 10/21/2016 13:52    Scheduled Meds: . cefTRIAXone (ROCEPHIN)  IV  2 g Intravenous Q12H  . docusate sodium  100 mg Oral BID  . feeding supplement (GLUCERNA SHAKE)  237 mL Oral BID BM  . insulin aspart  0-15 Units Subcutaneous TID WC  . insulin glargine  25 Units Subcutaneous QHS  . multivitamin with minerals  1 tablet Oral Daily  . polyethylene glycol  17 g Oral BID  . simvastatin  40 mg Oral q1800  . sodium chloride flush  3 mL Intravenous Q12H  . tamsulosin  0.4 mg Oral QPC breakfast  . vancomycin  1,000 mg Intravenous Q12H   Continuous Infusions: . sodium chloride 75 mL/hr at 10/23/16 0800  . sodium chloride Stopped (10/23/16 0700)  . lactated ringers 10 mL/hr  at 10/23/16 0700     LOS: 2 days   Time spent: 20 minutes   Faye Ramsay, MD [facility] Pager [phone]  If 7PM-7AM, please contact night-coverage [email or web address] Password [facility] 10/23/2016, 10:23 AM

## 2016-10-23 NOTE — Progress Notes (Signed)
[facility] for Infectious Disease   Reason for visit: Follow up on discitis/osteomyelitis  Interval History: had surgery yesterday, discectomy, debridement; no fever, no current complaints;   OP report reviewed  Physical Exam: Constitutional:  Vitals:   10/23/16 1000 10/23/16 1100  BP: (!) 152/95 (!) 158/97  Pulse: (!) 114 (!) 114  Resp: (!) 28 (!) 28  Temp:     patient appears in mild discomfort Eyes: anicteric HENT: no thrush Respiratory: Normal respiratory effort; CTA B Cardiovascular: RRR GI: soft, nt, nd  Review of Systems: Constitutional: negative for fevers and chills Gastrointestinal: negative for diarrhea Integument/breast: negative for rash  Lab Results  Component Value Date   WBC 6.9 10/23/2016   HGB 10.3 (L) 10/23/2016   HCT 32.9 (L) 10/23/2016   MCV 87.0 10/23/2016   PLT 225 10/23/2016    Lab Results  Component Value Date   CREATININE 0.67 10/23/2016   BUN 8 10/23/2016   NA 137 10/23/2016   K 3.9 10/23/2016   CL 102 10/23/2016   CO2 26 10/23/2016    Lab Results  Component Value Date   ALT 16 (L) 10/21/2016   AST 22 10/21/2016   ALKPHOS 56 10/21/2016     Microbiology: Recent Results (from the past 240 hour(s))  Blood culture (routine x 2)     Status: None (Preliminary result)   Collection Time: 10/21/16  6:44 PM  Result Value Ref Range Status   Specimen Description BLOOD RIGHT HAND  Final   Special Requests BOTTLES DRAWN AEROBIC AND ANAEROBIC 5CC  Final   Culture NO GROWTH 2 DAYS  Final   Report Status PENDING  Incomplete  Blood culture (routine x 2)     Status: None (Preliminary result)   Collection Time: 10/21/16  6:49 PM  Result Value Ref Range Status   Specimen Description BLOOD LEFT ANTECUBITAL  Final   Special Requests BOTTLES DRAWN AEROBIC ONLY 10CC  Final   Culture NO GROWTH 2 DAYS  Final   Report Status PENDING  Incomplete  Surgical pcr screen     Status: None   Collection Time: 10/22/16  6:57 AM  Result Value Ref  Range Status   MRSA, PCR NEGATIVE NEGATIVE Final   Staphylococcus aureus NEGATIVE NEGATIVE Final    Comment:        The Xpert SA Assay (FDA approved for NASAL specimens in patients over 70 years of age), is one component of a comprehensive surveillance program.  Test performance has been validated by [facility] for patients greater than or equal to 62 year old. It is not intended to diagnose infection nor to guide or monitor treatment.   Aerobic/Anaerobic Culture (surgical/deep wound)     Status: None (Preliminary result)   Collection Time: 10/22/16  8:15 PM  Result Value Ref Range Status   Specimen Description TISSUE  Final   Special Requests EPIDURAL SPEC A  Final   Gram Stain   Final    FEW WBC PRESENT,BOTH PMN AND MONONUCLEAR NO ORGANISMS SEEN    Culture PENDING  Incomplete   Report Status PENDING  Incomplete  Aerobic/Anaerobic Culture (surgical/deep wound)     Status: None (Preliminary result)   Collection Time: 10/22/16  8:15 PM  Result Value Ref Range Status   Specimen Description TISSUE  Final   Special Requests DURAL MEMBRANE SPEC B  Final   Gram Stain   Final    RARE WBC PRESENT,BOTH PMN AND MONONUCLEAR NO ORGANISMS SEEN    Culture PENDING  Incomplete   Report Status PENDING  Incomplete  Aerobic/Anaerobic Culture (surgical/deep wound)     Status: None (Preliminary result)   Collection Time: 10/22/16  8:15 PM  Result Value Ref Range Status   Specimen Description TISSUE  Final   Special Requests T8 9 DISK SPEC C  Final   Gram Stain   Final    RARE WBC PRESENT,BOTH PMN AND MONONUCLEAR NO ORGANISMS SEEN    Culture PENDING  Incomplete   Report Status PENDING  Incomplete    Impression/Plan:  1. T8, T9 discitis with osteomyelitis and epidural abscess.  Cultures pending and on vancomycin and ceftriaxone.  Blood cultures ngtd.  2. L4-5 - small abscess noted in new area though other areas resolved which is the site of the previously identified epidural abscess.     3.  Diabetes - HGB A1c improved at 7.3.

## 2016-10-23 NOTE — Progress Notes (Signed)
Patient ID: Charles Parker, male   DOB: 18-Jun-1946, 71 y.o.   MRN: [id number]  BP (!) 158/97   Pulse (!) 114   Temp 99.3 F (37.4 C) (Oral)   Resp (!) 28   Ht 6' (1.829 m)   Wt 74.8 kg (164 lb 14.5 oz)   SpO2 95%   BMI 22.37 kg/m  aLert and oriented x4 Still quite weak in the lower extremities, left lower extremity might be slightly better.  2/5dorsiflexors, plantarflexors 3/5 hip flexors right and left, though right is slightly stronger.  Wound is clean, dry, no signs of infection.  May be transferred today. Will call for tlso today.

## 2016-10-24 LAB — BASIC METABOLIC PANEL
Anion gap: 7 (ref 5–15)
BUN: 12 mg/dL (ref 6–20)
CALCIUM: 8.4 mg/dL — AB (ref 8.9–10.3)
CHLORIDE: 101 mmol/L (ref 101–111)
CO2: 27 mmol/L (ref 22–32)
CREATININE: 0.7 mg/dL (ref 0.61–1.24)
GFR calc non Af Amer: >60 mL/min (ref >60)
GLUCOSE: 163 mg/dL — AB (ref 65–99)
Potassium: 3.6 mmol/L (ref 3.5–5.1)
Sodium: 135 mmol/L (ref 135–145)

## 2016-10-24 LAB — CBC
HCT: 28.1 % — ABNORMAL LOW (ref 39.0–52.0)
Hemoglobin: 8.8 g/dL — ABNORMAL LOW (ref 13.0–17.0)
MCH: 27.1 pg (ref 26.0–34.0)
MCHC: 31.3 g/dL (ref 30.0–36.0)
MCV: 86.5 fL (ref 78.0–100.0)
Platelets: 214 10*3/uL (ref 150–400)
RBC: 3.25 MIL/uL — ABNORMAL LOW (ref 4.22–5.81)
RDW: 14.3 % (ref 11.5–15.5)
WBC: 12.1 10*3/uL — ABNORMAL HIGH (ref 4.0–10.5)

## 2016-10-24 LAB — GLUCOSE, CAPILLARY
GLUCOSE-CAPILLARY: 151 mg/dL — AB (ref 65–99)
GLUCOSE-CAPILLARY: 186 mg/dL — AB (ref 65–99)
GLUCOSE-CAPILLARY: 272 mg/dL — AB (ref 65–99)
Glucose-Capillary: 145 mg/dL — ABNORMAL HIGH (ref 65–99)
Glucose-Capillary: 197 mg/dL — ABNORMAL HIGH (ref 65–99)

## 2016-10-24 LAB — VANCOMYCIN, TROUGH: Vancomycin Tr: 10 ug/mL — ABNORMAL LOW (ref 15–20)

## 2016-10-24 MED ORDER — VANCOMYCIN HCL IN DEXTROSE 1-5 GM/200ML-% IV SOLN
1000.0000 mg | Freq: Three times a day (TID) | INTRAVENOUS | Status: DC
  Administered 2016-10-24 – 2016-10-27 (×10): 1000 mg via INTRAVENOUS
  Filled 2016-10-24 (×13): qty 200

## 2016-10-24 NOTE — Progress Notes (Signed)
Patient ID: Charles Parker, male   DOB: 1946-07-04, 71 y.o.   MRN: [id number] Patient doing well improved leg movement now appears to be antigravity both lower extremities  Incision clean dry and intact  Okay to transfer to floor continue physical occupational therapy. Continues on vancomycin and Rocephin

## 2016-10-24 NOTE — Progress Notes (Signed)
Pharmacy Antibiotic Note  Charles Parker is a 71 y.o. male admitted on 10/21/2016 with discitis/osteomyelitis.  Pharmacy has been consulted for Vancomycin dosing, MD dosing ceftriaxone.   Patient with tmax of 100.6, wbc up to 12.   Vancomycin trough drawn this am, resulted low at 10. Patient was receiving vancomycin 1g q 12 hours will increase dosing frequency to q8 hours for expected new trough of 18. Will need to recheck trough level in 24-48 hours to ensure patient is appropriately clearing medication.   1/19 epidural spec A cx: ngtd 1/19 Dural membran spec B: ngtd 1/18 BCx: ngtd 1/18 MRSA PCR: negative  Plan: Increase Vancomycin to 1 gram iv Q 8 hours  Follow Scr, cultures, progress Recheck vancomycin level early this week  Height: 6' (182.9 cm) Weight: 166 lb 7.2 oz (75.5 kg) IBW/kg (Calculated) : 77.6  Temp (24hrs), Avg:98.7 F (37.1 C), Min:98.1 F (36.7 C), Max:100.1 F (37.8 C)   Recent Labs Lab 10/21/16 1258 10/22/16 0358 10/23/16 0305 10/24/16 0550 10/24/16 0629  WBC 6.5 6.4 6.9  --  12.1*  CREATININE 0.76 0.70 0.67  --  0.70  VANCOTROUGH  --   --   --  10*  --     Estimated Creatinine Clearance: 91.8 mL/min (by C-G formula based on SCr of 0.7 mg/dL).    Allergies  Allergen Reactions  . Metformin And Related Diarrhea  . Codeine Rash    All over the body     Thank you for allowing pharmacy to be a part of this patient's care.  Erin Hearing PharmD., BCPS Clinical Pharmacist Pager [phone] 10/24/2016 2:52 PM

## 2016-10-24 NOTE — Progress Notes (Addendum)
Patient ID: Charles Parker, male   DOB: 01/04/1946, 71 y.o.   MRN: [id number]    PROGRESS NOTE    Charles Parker  [id number] DOB: [birth year] DOA: 10/21/2016  PCP: Scarlette Calico, MD   HPI: 71 y.o. male with medical history significant for recent hospitalization 09/26/16 - 10/05/16 for sepsis due to E.Coli bacteremia and pyelonephritis as well as L4, L5 epidural abscess with osteomyelitis and phlegmon and was supposed to be on rocephin through 11/07/16. TEE was done 10/05/2016 and was negative for endocarditis. This time pt presented with progressive LE weakness but no fevers, no specific urinary concerns, no chest pain, no dyspnea, no abd concerns.   Assessment and plan:  Osteomyelitis of thoracic vertebra [facility]) /  Thoracic discitis / Abscess in epidural space of lumbar spine / Thoracic spine fracture  - MRI thoracic and lumbar spine consistent with discitis and osteomyelitis with endplate destructive change at the T8-9 interspace. Rim enhancing fluid within the anterior aspect of the intervertebral disc consistent with abscess. New small fluid collection posterior to the left facet joint worrisome for abscess, possible septic facet joints but could be due to degenerative disease.  - Also seen on CT scan complex fracture at T8-9 with endplate destruction of both levels and possible discitis, osteomyelitis with extensive paraspinal density potentially from hematoma or phlegmon.  - s/p thoracic 8th, 9th, decompression, discectomy, post lateral arthrodesis T6-T11, post fixation t6-T11, post op day #1 - stable this AM in ICU - continue vancomycin day #3 and continue Rocephin as per previous home regimen - blood cultures obtained on admission - ID team consulted for assistance, appreciate help  - pt with low grade fever overnight and with WBC up from 6.9 --> 12.1, may need repeat blood cultures if persistent fever   Active Problems:   Benign prostatic hyperplasia (BPH) with straining on  urination - Continue Flomax     DVT, lower extremity, proximal (HCC) - Xarelto on hold as hG down post op from 10.3 --> 8.8     Diabetes mellitus, with long-term current use of insulin (HCC) - Continue Lantus 25 units at bedtime along with sliding scale insulin - A1C 7.3    Dyslipidemia associated with type 2 diabetes mellitus ([place])  - Resume statin therapy   DVT prophylaxis: Xarelto on hold, suspect can resume in AM if Hg stable and no further drop in Hg  Code Status: Full  Family Communication: Patient at bedside  Disposition Plan: transfer out of ICU   Consultants:   Neurosurgery   ID  Procedures:   10/22/2016 - Thoracic 8th, 9th, decompression, discectomy, post lateral arthrodesis T6-T11, post fixation t6-T11  Antimicrobials:   Vancomycin 1/18 -->  Rocephin resumed from previous home regimen --> 11/07/2016   Subjective: No events overnight.   Objective: Vitals:   10/24/16 1400 10/24/16 1500 10/24/16 1526 10/24/16 1600  BP: 123/71 128/79  105/69  Pulse:    (!) 106  Resp: (!) 22 (!) 28  (!) 25  Temp:    98.3 F (36.8 C)  TempSrc:    Oral  SpO2: 97%  98% 100%  Weight:      Height:        Intake/Output Summary (Last 24 hours) at 10/24/16 1722 Last data filed at 10/24/16 1600  Gross per 24 hour  Intake             2300 ml  Output             1275 ml  Net             1025 ml   Filed Weights   10/21/16 1204 10/23/16 0029 10/24/16 0500  Weight: 78.9 kg (174 lb) 74.8 kg (164 lb 14.5 oz) 75.5 kg (166 lb 7.2 oz)    Examination:  General exam: Appears calm and comfortable  Respiratory system: Respiratory effort normal. Cardiovascular system: S1 & S2 heard, RRR. No JVD, murmurs, rubs, gallops or clicks. No pedal edema. Gastrointestinal system: Abdomen is nondistended, soft and nontender. No organomegaly or masses felt. Normal bowel sounds heard. Central nervous system: Alert and oriented. LE weakness bilaterally 2/5  Data Reviewed: I have personally  reviewed following labs and imaging studies  CBC:  Recent Labs Lab 10/21/16 1258 10/22/16 0358 10/23/16 0305 10/24/16 0629  WBC 6.5 6.4 6.9 12.1*  NEUTROABS 4.6  --   --   --   HGB 10.0* 9.9* 10.3* 8.8*  HCT 32.1* 31.9* 32.9* 28.1*  MCV 88.4 88.1 87.0 86.5  PLT 236 220 225 [id number]   Basic Metabolic Panel:  Recent Labs Lab 10/21/16 1258 10/22/16 0358 10/23/16 0305 10/24/16 0629  NA 138 140 137 135  K 3.7 3.9 3.9 3.6  CL 103 102 102 101  CO2 26 28 26 27   GLUCOSE 113* 91 209* 163*  BUN 11 9 8 12   CREATININE 0.76 0.70 0.67 0.70  CALCIUM 9.1 9.1 8.9 8.4*   Liver Function Tests:  Recent Labs Lab 10/21/16 1258  AST 22  ALT 16*  ALKPHOS 56  BILITOT 0.3  PROT 7.4  ALBUMIN 2.5*    Recent Labs Lab 10/21/16 1258  LIPASE 15   HbA1C:  Recent Labs  10/21/16 1840  HGBA1C 7.3*   CBG:  Recent Labs Lab 10/23/16 1706 10/24/16 0048 10/24/16 0738 10/24/16 1156 10/24/16 1626  GLUCAP 280* 151* 145* 186* 272*   Urine analysis:    Component Value Date/Time   COLORURINE YELLOW 10/21/2016 1312   APPEARANCEUR TURBID (A) 10/21/2016 1312   LABSPEC 1.015 10/21/2016 1312   PHURINE 8.0 10/21/2016 1312   GLUCOSEU NEGATIVE 10/21/2016 1312   GLUCOSEU NEGATIVE 09/08/2016 1513   HGBUR MODERATE (A) 10/21/2016 1312   BILIRUBINUR NEGATIVE 10/21/2016 1312   BILIRUBINUR negative 08/17/2016 1359   KETONESUR NEGATIVE 10/21/2016 1312   PROTEINUR NEGATIVE 10/21/2016 1312   UROBILINOGEN 0.2 09/08/2016 1513   NITRITE NEGATIVE 10/21/2016 1312   LEUKOCYTESUR NEGATIVE 10/21/2016 1312    Recent Results (from the past 240 hour(s))  Surgical pcr screen     Status: None   Collection Time: 10/22/16  6:57 AM  Result Value Ref Range Status   MRSA, PCR NEGATIVE NEGATIVE Final   Staphylococcus aureus NEGATIVE NEGATIVE Final    Radiology Studies: Dg Thoracic Spine 2 View  Result Date: 10/22/2016 CLINICAL DATA:  Thoracic decompression and posterior-lateral arthrodesis. EXAM: THORACIC  SPINE 2 VIEWS; DG C-ARM 61-120 MIN COMPARISON:  MRI thoracic spine January 18th. FLUOROSCOPY TIME:  62 seconds FINDINGS: Three intraoperative fluoroscopic spot views submitted, interpreting radiologist was not present at time of procedure. Initial radiograph demonstrates pedicle screws at 4 levels. Last image demonstrates two consecutive level pedicle screw fixation. IMPRESSION: Thoracic fusion. Electronically Signed   By: Elon Alas M.D.   On: 10/22/2016 21:23   Dg C-arm 1-60 Min  Result Date: 10/22/2016 CLINICAL DATA:  Thoracic decompression and posterior-lateral arthrodesis. EXAM: THORACIC SPINE 2 VIEWS; DG C-ARM 61-120 MIN COMPARISON:  MRI thoracic spine January 18th. FLUOROSCOPY TIME:  62 seconds FINDINGS: Three intraoperative fluoroscopic spot views submitted,  interpreting radiologist was not present at time of procedure. Initial radiograph demonstrates pedicle screws at 4 levels. Last image demonstrates two consecutive level pedicle screw fixation. IMPRESSION: Thoracic fusion. Electronically Signed   By: Elon Alas M.D.   On: 10/22/2016 21:23    Scheduled Meds: . cefTRIAXone (ROCEPHIN)  IV  2 g Intravenous Q12H  . docusate sodium  100 mg Oral BID  . feeding supplement (GLUCERNA SHAKE)  237 mL Oral BID BM  . insulin aspart  0-15 Units Subcutaneous TID WC  . insulin glargine  25 Units Subcutaneous QHS  . multivitamin with minerals  1 tablet Oral Daily  . polyethylene glycol  17 g Oral BID  . simvastatin  40 mg Oral q1800  . sodium chloride flush  3 mL Intravenous Q12H  . tamsulosin  0.4 mg Oral QPC breakfast  . vancomycin  1,000 mg Intravenous Q8H   Continuous Infusions: . sodium chloride Stopped (10/23/16 0700)  . lactated ringers 10 mL/hr at 10/23/16 0700     LOS: 3 days   Time spent: 20 minutes   Faye Ramsay, MD [facility] Pager [phone]  If 7PM-7AM, please contact night-coverage [email or web address] Password [email or web address] 10/24/2016, 5:22 PM

## 2016-10-25 ENCOUNTER — Other Ambulatory Visit: Payer: Self-pay | Admitting: Licensed Clinical Social Worker

## 2016-10-25 DIAGNOSIS — G8918 Other acute postprocedural pain: Secondary | ICD-10-CM

## 2016-10-25 DIAGNOSIS — Z95828 Presence of other vascular implants and grafts: Secondary | ICD-10-CM

## 2016-10-25 DIAGNOSIS — M4644 Discitis, unspecified, thoracic region: Secondary | ICD-10-CM

## 2016-10-25 DIAGNOSIS — M4624 Osteomyelitis of vertebra, thoracic region: Principal | ICD-10-CM

## 2016-10-25 DIAGNOSIS — D72829 Elevated white blood cell count, unspecified: Secondary | ICD-10-CM

## 2016-10-25 DIAGNOSIS — Z794 Long term (current) use of insulin: Secondary | ICD-10-CM

## 2016-10-25 DIAGNOSIS — E118 Type 2 diabetes mellitus with unspecified complications: Secondary | ICD-10-CM

## 2016-10-25 DIAGNOSIS — R29898 Other symptoms and signs involving the musculoskeletal system: Secondary | ICD-10-CM

## 2016-10-25 DIAGNOSIS — D62 Acute posthemorrhagic anemia: Secondary | ICD-10-CM

## 2016-10-25 DIAGNOSIS — G061 Intraspinal abscess and granuloma: Secondary | ICD-10-CM

## 2016-10-25 LAB — CBC
HCT: 28.1 % — ABNORMAL LOW (ref 39.0–52.0)
HEMOGLOBIN: 8.9 g/dL — AB (ref 13.0–17.0)
MCH: 27.5 pg (ref 26.0–34.0)
MCHC: 31.7 g/dL (ref 30.0–36.0)
MCV: 86.7 fL (ref 78.0–100.0)
Platelets: 210 10*3/uL (ref 150–400)
RBC: 3.24 MIL/uL — AB (ref 4.22–5.81)
RDW: 14.2 % (ref 11.5–15.5)
WBC: 12.2 10*3/uL — ABNORMAL HIGH (ref 4.0–10.5)

## 2016-10-25 LAB — BASIC METABOLIC PANEL
Anion gap: 8 (ref 5–15)
BUN: 12 mg/dL (ref 6–20)
CHLORIDE: 103 mmol/L (ref 101–111)
CO2: 25 mmol/L (ref 22–32)
Calcium: 8.4 mg/dL — ABNORMAL LOW (ref 8.9–10.3)
Creatinine, Ser: 0.72 mg/dL (ref 0.61–1.24)
GFR calc Af Amer: >60 mL/min (ref >60)
GFR calc non Af Amer: >60 mL/min (ref >60)
GLUCOSE: 160 mg/dL — AB (ref 65–99)
POTASSIUM: 3.9 mmol/L (ref 3.5–5.1)
Sodium: 136 mmol/L (ref 135–145)

## 2016-10-25 LAB — GLUCOSE, CAPILLARY
GLUCOSE-CAPILLARY: 183 mg/dL — AB (ref 65–99)
GLUCOSE-CAPILLARY: 275 mg/dL — AB (ref 65–99)
GLUCOSE-CAPILLARY: 174 mg/dL — AB (ref 65–99)
Glucose-Capillary: 121 mg/dL — ABNORMAL HIGH (ref 65–99)

## 2016-10-25 LAB — MAGNESIUM: Magnesium: 1.7 mg/dL (ref 1.7–2.4)

## 2016-10-25 NOTE — Patient Outreach (Signed)
[facility]) Care Management  10/25/2016  Charles Parker Dec 11, 1945 [id number]   Assessment- Patient remains hospitalized. CSW has sent message to [facility]. CSW will follow case and await for discharge back to SNF.  Plan-CSW will continue to follow patient and await for discharge. CSW has not received a return call from SNF social worker.  [name], BSW, MSW, [email or web address] Phone: [phone] Fax: [phone]

## 2016-10-25 NOTE — Progress Notes (Signed)
Occupational Therapy Treatment Patient Details Name: Charles Parker MRN: [id number] DOB: 01-20-1946 Today's Date: 10/25/2016    History of present illness 71 yo male s/p T8, T9 discitis with osteomyelitis and epidural abscess. L4-5 - small abscess noted in new area though other areas resolved which is the site of the previously identified epidural abscess. T6-11 decompression PLA and fixation with I &D. recent admission 12/24 due to endocarditis.   OT comments  Pt making slow, but steady progress towards OT goals - continue plan of care for now. Continue to recommend CIR for intense rehab to help increase patient's overall independence and safety with ADLs and functional mobility. Pt currently requires overall mod assist +2 for squat pivot transfers and is max assist for UB ADLs (seated EOB), total assist for LB ADLs (bed level). Pt currently has a TLSO, however wearing schedule is not specified in orders. Pt stated he was told to wear it when he gets OOB, donned TLSO while pt seated EOB and kept TLSO donned while pt seated in recliner. TLSO needs to be cut down due to riding up and too high in the front.    Follow Up Recommendations  CIR;Supervision/Assistance - 24 hour    Equipment Recommendations  Other (comment) (TBD)    Recommendations for Other Services Rehab consult    Precautions / Restrictions Precautions Precautions: Fall;Back Precaution Comments: watch tachycardia Required Braces or Orthoses: Spinal Brace Spinal Brace: Thoracolumbosacral orthotic (wearing schedule no specified in orders) Restrictions Weight Bearing Restrictions: No    Mobility Bed Mobility Overal bed mobility: Needs Assistance Bed Mobility: Rolling;Supine to Sit Rolling: Mod assist   Supine to sit: +2 for physical assistance;Max assist     General bed mobility comments: Requires assistance for BLE out of bed and assistance for trunk control into sitting. Mod cues needed for sequencing.    Transfers Overall transfer level: Needs assistance Equipment used: Rolling walker (2 wheeled) Transfers: Sit to/from [employer] Sit to Stand: Mod assist;+2 physical assistance;+2 safety/equipment;From elevated surface   Squat pivot transfers: Mod assist;+2 physical assistance;+2 safety/equipment;From elevated surface     General transfer comment: Mod cues for safety and sequencing into standing. Pt attempted to take a few steps, but due to weak LLE pt unable to safely perform stand pivot transfer. Mod cues for safety and sequencing for safe and effective squat pivot transfer as well.     Balance Overall balance assessment: Needs assistance Sitting-balance support: Feet supported Sitting balance-Leahy Scale: Poor Sitting balance - Comments: Pt required up to mod assist to maintain sitting balance EOB    Standing balance support: Bilateral upper extremity supported Standing balance-Leahy Scale: Zero Standing balance comment: Pt requires max assist to maintain static standing using RW for stability.    ADL Overall ADL's : Needs assistance/impaired Eating/Feeding: Set up;Sitting   Grooming: Set up;Sitting   Upper Body Bathing: Maximal assistance;Sitting Upper Body Bathing Details (indicate cue type and reason): pt requires up to mod assist to maintain sitting balance EOB Lower Body Bathing: Total assistance;Bed level Lower Body Bathing Details (indicate cue type and reason): helpful to have +2 Upper Body Dressing : Maximal assistance;Sitting Upper Body Dressing Details (indicate cue type and reason): pt requires up to mod assist to maintain sitting balance EOB Lower Body Dressing: Total assistance;Bed level Lower Body Dressing Details (indicate cue type and reason): helpful to have +2 Toilet Transfer: Moderate assistance;+2 for safety/equipment;+2 for physical assistance;Squat-pivot Toilet Transfer Details (indicate cue type and reason): simulated EOB to recliner  transfer  Toileting- Clothing Manipulation and Hygiene: Total assistance;Bed level Toileting - Clothing Manipulation Details (indicate cue type and reason): helpful to have +2   Tub/Shower Transfer Details (indicate cue type and reason): did not occur, not appropriate at this time   General ADL Comments: pt tolerated EOB this session for ~10 minutes, but required up to mod assist to maintain static sitting balance EOB.            Cognition   Behavior During Therapy: WFL for tasks assessed/performed Overall Cognitive Status: Within Functional Limits for tasks assessed        Pertinent Vitals/ Pain       Pain Assessment: Faces Faces Pain Scale: Hurts even more Pain Location: back Pain Descriptors / Indicators: Grimacing;Guarding Pain Intervention(s): Monitored during session;Repositioned;Limited activity within patient's tolerance  Frequency  Min 3X/week      Progress Toward Goals  OT Goals(current goals can now be found in the care plan section)  Progress towards OT goals: Progressing toward goals  Acute Rehab OT Goals Patient Stated Goal: rehab before home OT Goal Formulation: With patient Time For Goal Achievement: 11/06/16 Potential to Achieve Goals: Good  Plan Discharge plan remains appropriate    Co-evaluation    PT/OT/SLP Co-Evaluation/Treatment: Yes Reason for Co-Treatment: Complexity of the patient's impairments (multi-system involvement);For patient/therapist safety PT goals addressed during session: Mobility/safety with mobility;Balance;Proper use of DME OT goals addressed during session: ADL's and self-care;Proper use of Adaptive equipment and DME      End of Session Equipment Utilized During Treatment: Gait belt;Rolling walker;Other (comment) (TLSO)   Activity Tolerance Patient tolerated treatment well   Patient Left in chair;with call bell/phone within reach;with family/visitor present;with chair alarm set   Nurse Communication Mobility status;Need  for lift equipment     Time: [id number] OT Time Calculation (min): 29 min  Charges: OT General Charges $OT Visit: 1 Procedure OT Treatments $Self Care/Home Management : 8-22 mins  Chrys Racer , MS, OTR/L, [place] Pager: [phone] 10/25/2016, 12:19 PM

## 2016-10-25 NOTE — Consult Note (Signed)
Physical Medicine and Rehabilitation Consult Reason for Consult: Thoracic abscess with diskitis.  Referring Physician: Dr. Doyle Askew.    HPI: Charles Parker is a 71 y.o. male with history of T2DM, DVT, recurrent E coli bacteremia with epidural abscess with recommendations for 6 weeks of IV ceftriaxone.  History taken from patient, brother, and chart review.  He was readmitted from [facility] on 10/21/16 with progressive BLE weakness and malaise. Repeat MRI spine revealed discitis and osteomyelitis about T8-9 with extensive Paraspinous inflammatory changes, with 1.8 cm X 0.9 cm X 0.9 cm  abscess, resolution of prior two small fluid collections at L4/5 and new small fluid collection posterior to left facet worrisome for abscess and septic facet joints v/s degenerative changes. He was taken to OR on 01/19 for T8-T9 decompressive discectomy with posterior arthrodesis T6-T11 by Dr. Christella Noa.   PT/OT evaluations done this weekend and CIR recommended for follow up therapy. Requiring significant assistance with therapies. On IV Vancomycin and Ceftriaxone pending cultures and Dr. Linus Salmons following for input.  Has been retired for 10+ years and drove cars occasionally to help out family.  Was independent with cane prior to November. Would like to be lot stronger prior to returning to SNF. He  lives alone and needs to be independent at discharge.    Review of Systems  Constitutional: Negative for chills and fever.  HENT: Negative for hearing loss and tinnitus.   Eyes: Negative for blurred vision and double vision.  Respiratory: Positive for cough. Negative for hemoptysis and shortness of breath.   Cardiovascular: Positive for leg swelling. Negative for chest pain and palpitations.  Gastrointestinal: Negative for constipation, heartburn and nausea.  Genitourinary: Negative for dysuria and urgency.  Musculoskeletal: Positive for back pain and myalgias.  Skin: Negative for itching and rash.    Neurological: Positive for sensory change (left foot), focal weakness and weakness. Negative for speech change.  Psychiatric/Behavioral: Negative for memory loss. The patient is not nervous/anxious and does not have insomnia.   All other systems reviewed and are negative.    Past Medical History:  Diagnosis Date  . Clotting disorder (HCC)    DVT  . Diabetes mellitus    type II  . Diverticulosis   . DVT (deep venous thrombosis) ([place])   . Glaucoma    no eye drops   . Hepatic cyst   . Hypercholesterolemia   . Hypertension   . Myocardial infarction    very mild long ago   . Renal cyst   . Sleep apnea   . Tubulovillous adenoma     Past Surgical History:  Procedure Laterality Date  . BLADDER NECK RECONSTRUCTION  02/03/2012   Procedure: BLADDER NECK REPAIR;  Surgeon: Adin Hector, MD;  Location: [facility] ORS;  Service: General;;  . COLONOSCOPY  02/2013   polyp removal(mult.)  . KNEE SURGERY  2004   left  . POLYPECTOMY    . PROCTOSCOPY  02/03/2012   Procedure: PROCTOSCOPY;  Surgeon: Adin Hector, MD;  Location: [facility] ORS;  Service: General;;  . STOMACH SURGERY  02/2012  . TEE WITHOUT CARDIOVERSION N/A 10/05/2016   Procedure: TRANSESOPHAGEAL ECHOCARDIOGRAM (TEE);  Surgeon: Sanda Klein, MD;  Location: [facility] ENDOSCOPY;  Service: Cardiovascular;  Laterality: N/A;    Family History  Problem Relation Age of Onset  . Hypertension Mother   . Diabetes Mother   . Cancer Mother     ? location  . Colon cancer Father 9  . Colon polyps Neg  Hx     Social History:  Lives alone. Independent with cane PTA. Activity limited to bowling once a week. He reports that he has never smoked. He has never used smokeless tobacco. He reports that he drinks alcohol. He reports that he does not use drugs.     Allergies  Allergen Reactions  . Metformin And Related Diarrhea  . Codeine Rash    All over the body   Medications Prior to Admission  Medication Sig Dispense Refill  . cefTRIAXone (ROCEPHIN)  IVPB Inject 2 g into the vein every 12 (twelve) hours. Indication:  Ecoli bacteremia with small epidural abscess/myositis Last Day of Therapy:  11/07/16 Labs - Once weekly:  CBC/D and BMP, Labs - Every other week:  ESR and CRP 68 Units 0  . HYDROcodone-acetaminophen (NORCO/VICODIN) 5-325 MG tablet Take 1 tablet by mouth every 6 (six) hours as needed for moderate pain. 15 tablet 0  . Insulin Pen Needle 31G X 5 MM MISC Administer lantus once nightly 90 each 0  . methocarbamol (ROBAXIN) 500 MG tablet Take 1 tablet (500 mg total) by mouth every 8 (eight) hours as needed for muscle spasms.    . Multiple Vitamin (MULTIVITAMIN WITH MINERALS) TABS tablet Take 1 tablet by mouth daily.    . polyethylene glycol (MIRALAX / GLYCOLAX) packet Take 17 g by mouth 2 (two) times daily. 14 each 0  . rivaroxaban (XARELTO) 20 MG TABS tablet Take 1 tablet (20 mg total) by mouth daily with supper. 90 tablet 0  . simvastatin (ZOCOR) 40 MG tablet TAKE 1 TABLET(40 MG) BY MOUTH EVERY EVENING 90 tablet 3  . tamsulosin (FLOMAX) 0.4 MG CAPS capsule Take 0.4 mg by mouth daily after breakfast.       Home: Home Living Family/patient expects to be discharged to:: Inpatient rehab Living Arrangements: Alone Available Help at Discharge: Family, Available PRN/intermittently Type of Home: Apartment Home Access: Level entry Home Layout: One level Bathroom Shower/Tub: Chiropodist: Standard Home Equipment: Cane - single point Additional Comments: pt 1 month ago was independent with all adls and driving. pt currently still has an apartment  Functional History: Prior Function Level of Independence: Needs assistance Gait / Transfers Assistance Needed: uses cane to be independent ADL's / Homemaking Assistance Needed: prior to 1 month ago was independent. pt with progressive weakness with documentation in chart currently for central cord by Neurology Functional Status:  Mobility: Bed Mobility Overal bed  mobility: Needs Assistance Bed Mobility: Rolling, Supine to Sit, Sit to Supine Rolling: Max assist Supine to sit: +2 for physical assistance, Max assist Sit to supine: +2 for physical assistance, Max assist General bed mobility comments: requires (A) for BIL LE in and out of bed. Pt requires max cues for sequence. pt initiating and progressing with therapist. pt limted EOB due to incr in HR 138 Transfers General transfer comment: not attempted due to tachycardia Ambulation/Gait General Gait Details: pt unable due to weakness and tachycardia    ADL: ADL Overall ADL's : Needs assistance/impaired Eating/Feeding: Set up Grooming: Wash/dry hands, Wash/dry face, Set up, Bed level Upper Body Bathing: Moderate assistance Lower Body Bathing: Total assistance General ADL Comments: pt tolerated EOB this session for ~5 minutes  Cognition: Cognition Overall Cognitive Status: Within Functional Limits for tasks assessed Orientation Level: Oriented X4 Cognition Arousal/Alertness: Awake/alert Behavior During Therapy: WFL for tasks assessed/performed Overall Cognitive Status: Within Functional Limits for tasks assessed  Blood pressure 135/83, pulse 98, temperature 98.9 F (37.2 C), temperature source Oral,  resp. rate 20, height 6' (1.829 m), weight 84.4 kg (186 lb 1.1 oz), SpO2 100 %. Physical Exam  Nursing note and vitals reviewed. Constitutional: He is oriented to person, place, and time. He appears well-developed and well-nourished. No distress.  HENT:  Head: Normocephalic and atraumatic.  Mouth/Throat: Oropharynx is clear and moist.  Eyes: Conjunctivae are normal. Pupils are equal, round, and reactive to light. No scleral icterus.  Neck: Neck supple.  Cardiovascular: Regular rhythm.  Tachycardia present.   Respiratory: Effort normal and breath sounds normal. Stridor present. No respiratory distress.  GI: Soft. Bowel sounds are normal. He exhibits no distension. There is no tenderness.   Musculoskeletal: He exhibits edema (LLE with 1+ edema).  LLE weakness with internal rotation. Bilateral hands with thenar atrophy.   Neurological: He is alert and oriented to person, place, and time.  Speech clear. Follows commands without difficulty.  Skin: Skin is warm and dry. He is not diaphoretic.  Psychiatric: He has a normal mood and affect. His behavior is normal. Judgment and thought content normal.    Results for orders placed or performed during the hospital encounter of 10/21/16 (from the past 24 hour(s))  Glucose, capillary     Status: Abnormal   Collection Time: 10/24/16 11:56 AM  Result Value Ref Range   Glucose-Capillary 186 (H) 65 - 99 mg/dL   Comment 1 Notify RN    Comment 2 Document in Chart   Glucose, capillary     Status: Abnormal   Collection Time: 10/24/16  4:26 PM  Result Value Ref Range   Glucose-Capillary 272 (H) 65 - 99 mg/dL   Comment 1 Notify RN    Comment 2 Document in Chart   Glucose, capillary     Status: Abnormal   Collection Time: 10/24/16  9:30 PM  Result Value Ref Range   Glucose-Capillary 197 (H) 65 - 99 mg/dL   Comment 1 Notify RN    Comment 2 Document in Chart   CBC     Status: Abnormal   Collection Time: 10/25/16  2:16 AM  Result Value Ref Range   WBC 12.2 (H) 4.0 - 10.5 K/uL   RBC 3.24 (L) 4.22 - 5.81 MIL/uL   Hemoglobin 8.9 (L) 13.0 - 17.0 g/dL   HCT 28.1 (L) 39.0 - 52.0 %   MCV 86.7 78.0 - 100.0 fL   MCH 27.5 26.0 - 34.0 pg   MCHC 31.7 30.0 - 36.0 g/dL   RDW 14.2 11.5 - 15.5 %   Platelets 210 150 - 400 K/uL  Basic metabolic panel     Status: Abnormal   Collection Time: 10/25/16  2:16 AM  Result Value Ref Range   Sodium 136 135 - 145 mmol/L   Potassium 3.9 3.5 - 5.1 mmol/L   Chloride 103 101 - 111 mmol/L   CO2 25 22 - 32 mmol/L   Glucose, Bld 160 (H) 65 - 99 mg/dL   BUN 12 6 - 20 mg/dL   Creatinine, Ser 0.72 0.61 - 1.24 mg/dL   Calcium 8.4 (L) 8.9 - 10.3 mg/dL   GFR calc non Af Amer >60 >60 mL/min   GFR calc Af Amer >60  >60 mL/min   Anion gap 8 5 - 15  Magnesium     Status: None   Collection Time: 10/25/16  2:16 AM  Result Value Ref Range   Magnesium 1.7 1.7 - 2.4 mg/dL  Glucose, capillary     Status: Abnormal   Collection Time: 10/25/16  6:23 AM  Result Value Ref Range   Glucose-Capillary 121 (H) 65 - 99 mg/dL   Comment 1 Notify RN    Comment 2 Document in Chart    No results found.  Assessment/Plan: Diagnosis: Thoracic abscess with diskitis Labs and images independently reviewed.  Records reviewed and summated above.  1. Does the need for close, 24 hr/day medical supervision in concert with the patient's rehab needs make it unreasonable for this patient to be served in a less intensive setting? Potentially  2. Co-Morbidities requiring supervision/potential complications: T2 DM (Monitor in accordance with exercise and adjust meds as necessary), DVT (cont meds), recurrent E coli bacteremia with epidural abscess (cont IV abx, including Vanc until cultures return - level low on 1/21), leukocytosis (cont to monitor for signs and symptoms of infection, further workup if indicated), ABLA (transfuse if necessary to ensure appropriate perfusion for increased activity tolerance), post-op pain (Biofeedback training with therapies to help reduce reliance on opiate pain medications, monitor pain control during therapies, and sedation at rest and titrate to maximum efficacy to ensure participation and gains in therapies) 3. Due to bladder management, bowel management, safety, skin/wound care, disease management, pain management and patient education, does the patient require 24 hr/day rehab nursing? Yes 4. Does the patient require coordinated care of a physician, rehab nurse, PT (1-2 hrs/day, 5 days/week) and OT (1-2 hrs/day, 5 days/week) to address physical and functional deficits in the context of the above medical diagnosis(es)? Yes Addressing deficits in the following areas: balance, endurance, locomotion, strength,  transferring, bowel/bladder control, bathing, dressing, toileting and psychosocial support 5. Can the patient actively participate in an intensive therapy program of at least 3 hrs of therapy per day at least 5 days per week? Potentially 6. The potential for patient to make measurable gains while on inpatient rehab is good 7. Anticipated functional outcomes upon discharge from inpatient rehab are min assist and mod assist  with PT, min assist and mod assist with OT, n/a with SLP. 8. Estimated rehab length of stay to reach the above functional goals is: 20-25 days. 9. Does the patient have adequate social supports and living environment to accommodate these discharge functional goals? No 10. Anticipated D/C setting: SNF 11. Anticipated post D/C treatments: SNF 12. Overall Rehab/Functional Prognosis: good  RECOMMENDATIONS: This patient's condition is appropriate for continued rehabilitative care in the following setting: Pt without necessary support at discharge and will unlikely achieve an indepenent level of functioning after IRF.  Further, pt unable to tolerate 3 hours therapy/day and would benefit from a less intese longer duration for therapies. Recommend SNF. Follow up with PM&R as outpt. Patient has agreed to participate in recommended program. Potentially Note that insurance prior authorization may be required for reimbursement for recommended care.  Comment: Rehab Admissions Coordinator to follow up.  Delice Lesch, MD, [address], [place] 10/25/2016

## 2016-10-25 NOTE — Progress Notes (Signed)
Patient ID: Charles Parker, male   DOB: [birth year], 71 y.o.   MRN: [id number]    PROGRESS NOTE  Charles Parker  [id number] DOB: October 16, 1945 DOA: 10/21/2016  PCP: Charles Calico, MD   HPI: 71 y.o. male with medical history significant for recent hospitalization 09/26/16 - 10/05/16 for sepsis due to E.Coli bacteremia and pyelonephritis as well as L4, L5 epidural abscess with osteomyelitis and phlegmon and was supposed to be on rocephin through 11/07/16. TEE was done 10/05/2016 and was negative for endocarditis. This time pt presented with progressive LE weakness but no fevers, no specific urinary concerns, no chest pain, no dyspnea, no abd concerns.   Assessment and plan:  Osteomyelitis of thoracic vertebra [name]) /  Thoracic discitis / Abscess in epidural space of lumbar spine / Thoracic spine fracture  - MRI thoracic and lumbar spine consistent with discitis and osteomyelitis with endplate destructive change at the T8-9 interspace. Rim enhancing fluid within the anterior aspect of the intervertebral disc consistent with abscess. New small fluid collection posterior to the left facet joint worrisome for abscess, possible septic facet joints but could be due to degenerative disease.  - Also seen on CT scan complex fracture at T8-9 with endplate destruction of both levels and possible discitis, osteomyelitis with extensive paraspinal density potentially from hematoma or phlegmon.  - s/p thoracic 8th, 9th, decompression, discectomy, post lateral arthrodesis T6-T11, post fixation t6-T11, post op day #2 - stable this AM in ICU - continue vancomycin day #4 and continue Rocephin as per previous home regimen - blood cultures obtained on admission - ID team consulted for assistance, appreciate help  - pt with low grade fever overnight 99 F and with WBC up from 6.9 --> 12.1 --> 12.2 - CBC iN AM  Active Problems:   Benign prostatic hyperplasia (BPH) with straining on urination - Continue Flomax    DVT, lower extremity, proximal (HCC) - Xarelto on hold as hG down post op from 10.3 --> 8.8 --> 8.9    Diabetes mellitus, with long-term current use of insulin (HCC) - Continue Lantus 25 units at bedtime along with sliding scale insulin - A1C 7.3    Dyslipidemia associated with type 2 diabetes mellitus (HCC)  - Resume statin therapy   DVT prophylaxis: Xarelto on hold, suspect can resume in AM if Hg stable and no further drop in Hg  Code Status: Full  Family Communication: Patient at bedside  Disposition Plan: inpatient rehab if insurance approves   Consultants:   Neurosurgery   ID  Procedures:   10/22/2016 - Thoracic 8th, 9th, decompression, discectomy, post lateral arthrodesis T6-T11, post fixation t6-T11  Antimicrobials:   Vancomycin 1/18 -->  Rocephin resumed from previous home regimen --> 11/07/2016   Subjective: No events overnight.   Objective: Vitals:   10/25/16 0100 10/25/16 0442 10/25/16 0500 10/25/16 1018  BP: 115/76  135/83 110/72  Pulse: (!) 107  98 (!) 112  Resp: 20  20 19   Temp: 99.1 F (37.3 C)  98.9 F (37.2 C) 99 F (37.2 C)  TempSrc: Oral  Oral Oral  SpO2: 98%  100% 100%  Weight:  84.4 kg (186 lb 1.1 oz)    Height:        Intake/Output Summary (Last 24 hours) at 10/25/16 1138 Last data filed at 10/25/16 0454  Gross per 24 hour  Intake              975 ml  Output  600 ml  Net              375 ml   Filed Weights   10/23/16 0029 10/24/16 0500 10/25/16 0442  Weight: 74.8 kg (164 lb 14.5 oz) 75.5 kg (166 lb 7.2 oz) 84.4 kg (186 lb 1.1 oz)    Examination:  General exam: Appears calm and comfortable  Respiratory system: Respiratory effort normal. Cardiovascular system: S1 & S2 heard, RRR. No JVD, murmurs, rubs, gallops or clicks. No pedal edema. Gastrointestinal system: Abdomen is nondistended, soft and nontender. No organomegaly or masses felt. Normal bowel sounds heard. Central nervous system: Alert and oriented. LE weakness  bilaterally 2/5  Data Reviewed: I have personally reviewed following labs and imaging studies  CBC:  Recent Labs Lab 10/21/16 1258 10/22/16 0358 10/23/16 0305 10/24/16 0629 10/25/16 0216  WBC 6.5 6.4 6.9 12.1* 12.2*  NEUTROABS 4.6  --   --   --   --   HGB 10.0* 9.9* 10.3* 8.8* 8.9*  HCT 32.1* 31.9* 32.9* 28.1* 28.1*  MCV 88.4 88.1 87.0 86.5 86.7  PLT 236 220 225 214 [id number]   Basic Metabolic Panel:  Recent Labs Lab 10/21/16 1258 10/22/16 0358 10/23/16 0305 10/24/16 0629 10/25/16 0216  NA 138 140 137 135 136  K 3.7 3.9 3.9 3.6 3.9  CL 103 102 102 101 103  CO2 26 28 26 27 25   GLUCOSE 113* 91 209* 163* 160*  BUN 11 9 8 12 12   CREATININE 0.76 0.70 0.67 0.70 0.72  CALCIUM 9.1 9.1 8.9 8.4* 8.4*  MG  --   --   --   --  1.7   Liver Function Tests:  Recent Labs Lab 10/21/16 1258  AST 22  ALT 16*  ALKPHOS 56  BILITOT 0.3  PROT 7.4  ALBUMIN 2.5*    Recent Labs Lab 10/21/16 1258  LIPASE 15   CBG:  Recent Labs Lab 10/24/16 1156 10/24/16 1626 10/24/16 2130 10/25/16 0623 10/25/16 1121  GLUCAP 186* 272* 197* 121* 183*   Urine analysis:    Component Value Date/Time   COLORURINE YELLOW 10/21/2016 1312   APPEARANCEUR TURBID (A) 10/21/2016 1312   LABSPEC 1.015 10/21/2016 1312   PHURINE 8.0 10/21/2016 1312   GLUCOSEU NEGATIVE 10/21/2016 1312   GLUCOSEU NEGATIVE 09/08/2016 1513   HGBUR MODERATE (A) 10/21/2016 1312   BILIRUBINUR NEGATIVE 10/21/2016 1312   BILIRUBINUR negative 08/17/2016 1359   KETONESUR NEGATIVE 10/21/2016 1312   PROTEINUR NEGATIVE 10/21/2016 1312   UROBILINOGEN 0.2 09/08/2016 1513   NITRITE NEGATIVE 10/21/2016 1312   LEUKOCYTESUR NEGATIVE 10/21/2016 1312    Recent Results (from the past 240 hour(s))  Surgical pcr screen     Status: None   Collection Time: 10/22/16  6:57 AM  Result Value Ref Range Status   MRSA, PCR NEGATIVE NEGATIVE Final   Staphylococcus aureus NEGATIVE NEGATIVE Final    Radiology Studies: No results  found.  Scheduled Meds: . cefTRIAXone (ROCEPHIN)  IV  2 g Intravenous Q12H  . docusate sodium  100 mg Oral BID  . feeding supplement (GLUCERNA SHAKE)  237 mL Oral BID BM  . insulin aspart  0-15 Units Subcutaneous TID WC  . insulin glargine  25 Units Subcutaneous QHS  . multivitamin with minerals  1 tablet Oral Daily  . polyethylene glycol  17 g Oral BID  . simvastatin  40 mg Oral q1800  . sodium chloride flush  3 mL Intravenous Q12H  . tamsulosin  0.4 mg Oral QPC breakfast  . vancomycin  1,000 mg Intravenous Q8H   Continuous Infusions: . sodium chloride Stopped (10/23/16 0700)  . lactated ringers 10 mL/hr at 10/23/16 0700     LOS: 4 days   Time spent: 20 minutes   Faye Ramsay, MD [facility] Pager [phone]  If 7PM-7AM, please contact night-coverage [email or web address] Password [facility] 10/25/2016, 11:38 AM

## 2016-10-25 NOTE — Care Management Important Message (Signed)
Important Message  Patient Details  Name: Charles Parker MRN: [id number] Date of Birth: 10-29-1945   Medicare Important Message Given:  Yes    Whalen Trompeter 10/25/2016, 11:00 AM

## 2016-10-25 NOTE — Progress Notes (Signed)
Patient ID: Charles Parker, male   DOB: [birth year], 71 y.o.   MRN: [id number] BP 126/74 (BP Location: Left Arm)   Pulse (!) 109   Temp 99.1 F (37.3 C) (Oral)   Resp 18   Ht 6' (1.829 m)   Wt 84.4 kg (186 lb 1.1 oz)   SpO2 99%   BMI 25.24 kg/m  Alert and oriented x 4, plantar flexion and dorsiflexion present, left >right Wound with dressing No growth from cultures Continue with abx

## 2016-10-25 NOTE — Progress Notes (Signed)
Physical Therapy Treatment Patient Details Name: Charles Parker MRN: [id number] DOB: 1946-07-07 Today's Date: 10/25/2016    History of Present Illness 71 yo male s/p T8, T9 discitis with osteomyelitis and epidural abscess. L4-5 - small abscess noted in new area though other areas resolved which is the site of the previously identified epidural abscess. T6-11 decompression PLA and fixation with I &D. recent admission 12/24 due to pyelonephritis with sepsis and L4, L5 epidural abscess with osteomyelitis.    PT Comments    Patient progressing to OOB this session with brace.  Unable to stand pivot due to L LE weakness and unable to progress R LE despite heavy UE support on walker.  Feel he is motivated and would be good candidate for inpatient rehab if able to set up assist at home at d/c.   Follow Up Recommendations  CIR     Equipment Recommendations  None recommended by PT    Recommendations for Other Services Rehab consult     Precautions / Restrictions Precautions Precautions: Fall;Back Precaution Comments: watch tachycardia Required Braces or Orthoses: Spinal Brace Spinal Brace: Thoracolumbosacral orthotic (donning position not in orders) Restrictions Weight Bearing Restrictions: No    Mobility  Bed Mobility Overal bed mobility: Needs Assistance Bed Mobility: Rolling;Supine to Sit Rolling: Mod assist   Supine to sit: +2 for physical assistance;Max assist     General bed mobility comments: Requires assistance for BLE out of bed and assistance for trunk control into sitting. Mod cues needed for sequencing.   Transfers Overall transfer level: Needs assistance Equipment used: Rolling walker (2 wheeled) Transfers: Sit to/from [employer] Sit to Stand: Mod assist;+2 physical assistance;+2 safety/equipment;From elevated surface   Squat pivot transfers: Mod assist;+2 physical assistance;+2 safety/equipment;From elevated surface     General transfer  comment: Mod cues for safety and sequencing into standing. Pt attempted to take a few steps, but due to weak LLE pt unable to safely perform stand pivot transfer. Mod cues for safety and sequencing for safe and effective squat pivot transfer as well.   Ambulation/Gait                 Stairs            Wheelchair Mobility    Modified Rankin (Stroke Patients Only)       Balance Overall balance assessment: Needs assistance Sitting-balance support: Feet supported Sitting balance-Leahy Scale: Poor Sitting balance - Comments: Pt required up to mod assist to maintain sitting balance EOB, able to lean forward with cues and balance with S at times with UE support   Standing balance support: Bilateral upper extremity supported Standing balance-Leahy Scale: Zero Standing balance comment: Pt requires max assist to maintain static standing using RW for stability.                     Cognition Arousal/Alertness: Awake/alert Behavior During Therapy: WFL for tasks assessed/performed Overall Cognitive Status: Within Functional Limits for tasks assessed                      Exercises General Exercises - Lower Extremity Ankle Circles/Pumps: PROM;AAROM;5 reps;Supine;Both Heel Slides: AAROM;Both;5 reps;Supine    General Comments        Pertinent Vitals/Pain Pain Assessment: No/denies pain Faces Pain Scale: Hurts even more Pain Location: back Pain Descriptors / Indicators: Grimacing;Guarding Pain Intervention(s): Monitored during session;Limited activity within patient's tolerance;Repositioned    Home Living  Prior Function            PT Goals (current goals can now be found in the care plan section) Acute Rehab PT Goals Patient Stated Goal: rehab before home Progress towards PT goals: Progressing toward goals    Frequency    Min 5X/week      PT Plan Current plan remains appropriate;Frequency needs to be updated     Co-evaluation PT/OT/SLP Co-Evaluation/Treatment: Yes Reason for Co-Treatment: Complexity of the patient's impairments (multi-system involvement);For patient/therapist safety PT goals addressed during session: Mobility/safety with mobility;Balance;Proper use of DME OT goals addressed during session: ADL's and self-care;Proper use of Adaptive equipment and DME     End of Session Equipment Utilized During Treatment: Back brace Activity Tolerance: Patient tolerated treatment well Patient left: in chair;with chair alarm set;with call bell/phone within reach;with family/visitor present     Time: [id number] PT Time Calculation (min) (ACUTE ONLY): 29 min  Charges:  $Therapeutic Activity: 8-22 mins                    G Codes:      Reginia Naas 11/07/2016, 12:36 PM  Magda Kiel, [place] 2016/11/07

## 2016-10-25 NOTE — Progress Notes (Signed)
Rehab admissions - Please see rehab consult recommending SNF placement for rehab needs.  Call me for questions.  [phone]

## 2016-10-25 NOTE — Progress Notes (Signed)
[facility]   Reason for visit: Follow up on discitis/osteomyelitis  Interval History: some improvement in pain, no fever, WBC stable  Physical Exam: Constitutional:  Vitals:   10/25/16 0500 10/25/16 1018  BP: 135/83 110/72  Pulse: 98 (!) 112  Resp: 20 19  Temp: 98.9 F (37.2 C) 99 F (37.2 C)   patient appears in NAD; up in chair Respiratory: Normal respiratory effort; CTA B Cardiovascular: RRR MS: brace on   Review of Systems: Constitutional: negative for fevers, chills and fatigue Gastrointestinal: negative for diarrhea Integument/breast: negative for rash  Lab Results  Component Value Date   WBC 12.2 (H) 10/25/2016   HGB 8.9 (L) 10/25/2016   HCT 28.1 (L) 10/25/2016   MCV 86.7 10/25/2016   PLT 210 10/25/2016    Lab Results  Component Value Date   CREATININE 0.72 10/25/2016   BUN 12 10/25/2016   NA 136 10/25/2016   K 3.9 10/25/2016   CL 103 10/25/2016   CO2 25 10/25/2016    Lab Results  Component Value Date   ALT 16 (L) 10/21/2016   AST 22 10/21/2016   ALKPHOS 56 10/21/2016     Microbiology: Recent Results (from the past 240 hour(s))  Blood culture (routine x 2)     Status: None (Preliminary result)   Collection Time: 10/21/16  6:44 PM  Result Value Ref Range Status   Specimen Description BLOOD RIGHT HAND  Final   Special Requests BOTTLES DRAWN AEROBIC AND ANAEROBIC 5CC  Final   Culture NO GROWTH 3 DAYS  Final   Report Status PENDING  Incomplete  Blood culture (routine x 2)     Status: None (Preliminary result)   Collection Time: 10/21/16  6:49 PM  Result Value Ref Range Status   Specimen Description BLOOD LEFT ANTECUBITAL  Final   Special Requests BOTTLES DRAWN AEROBIC ONLY 10CC  Final   Culture NO GROWTH 3 DAYS  Final   Report Status PENDING  Incomplete  Surgical pcr screen     Status: None   Collection Time: 10/22/16  6:57 AM  Result Value Ref Range Status   MRSA, PCR NEGATIVE NEGATIVE Final   Staphylococcus  aureus NEGATIVE NEGATIVE Final    Comment:        The Xpert SA Assay (FDA approved for NASAL specimens in patients over 42 years of age), is one component of a comprehensive surveillance program.  Test performance has been validated by [facility] for patients greater than or equal to 80 year old. It is not intended to diagnose infection nor to guide or monitor treatment.   Aerobic/Anaerobic Culture (surgical/deep wound)     Status: None (Preliminary result)   Collection Time: 10/22/16  8:15 PM  Result Value Ref Range Status   Specimen Description TISSUE  Final   Special Requests EPIDURAL SPEC A  Final   Gram Stain   Final    FEW WBC PRESENT,BOTH PMN AND MONONUCLEAR NO ORGANISMS SEEN    Culture   Final    NO GROWTH 2 DAYS NO ANAEROBES ISOLATED; CULTURE IN PROGRESS FOR 5 DAYS   Report Status PENDING  Incomplete  Aerobic/Anaerobic Culture (surgical/deep wound)     Status: None (Preliminary result)   Collection Time: 10/22/16  8:15 PM  Result Value Ref Range Status   Specimen Description TISSUE  Final   Special Requests DURAL MEMBRANE SPEC B  Final   Gram Stain   Final    RARE WBC PRESENT,BOTH PMN AND  MONONUCLEAR NO ORGANISMS SEEN    Culture   Final    NO GROWTH 2 DAYS NO ANAEROBES ISOLATED; CULTURE IN PROGRESS FOR 5 DAYS   Report Status PENDING  Incomplete  Aerobic/Anaerobic Culture (surgical/deep wound)     Status: None (Preliminary result)   Collection Time: 10/22/16  8:15 PM  Result Value Ref Range Status   Specimen Description TISSUE  Final   Special Requests T8 9 DISK SPEC C  Final   Gram Stain   Final    RARE WBC PRESENT,BOTH PMN AND MONONUCLEAR NO ORGANISMS SEEN    Culture   Final    NO GROWTH 2 DAYS NO ANAEROBES ISOLATED; CULTURE IN PROGRESS FOR 5 DAYS   Report Status PENDING  Incomplete    Impression/Plan:  1. Discitis/osteomyelitis - s/p surgery.  Culture ngtd, previously E coli.  I will continue with vancomcyin and ceftriaxone unless new culture grows  out. Antibiotics for 6 weeks through March 1st Going to rehab  Antibiotics per rehab protocol Leave in picc line at the end of treatment until he has follow up in ID clinic, I will arrange follow up  2. dispo - has picc line.  opat order set.

## 2016-10-25 NOTE — Care Management Note (Signed)
Case Management Note  Patient Details  Name: Charles Parker MRN: [id number] Date of Birth: [birth year]  Subjective/Objective:                    Action/Plan: PT/OT recommending CIR. Pt continues on IV antibiotics. CM following for discharge disposition.   Expected Discharge Date:                  Expected Discharge Plan:  [place]  In-House Referral:     Discharge planning Services     Post Acute Care Choice:    Choice offered to:     DME Arranged:    DME Agency:     HH Arranged:    [place] Agency:     Status of Service:  In process, will continue to follow  If discussed at Long Length of Stay Meetings, dates discussed:    Additional Comments:  Pollie Friar, RN 10/25/2016, 11:17 AM

## 2016-10-26 LAB — CBC
HCT: 27.1 % — ABNORMAL LOW (ref 39.0–52.0)
HEMOGLOBIN: 8.5 g/dL — AB (ref 13.0–17.0)
MCH: 27.1 pg (ref 26.0–34.0)
MCHC: 31.4 g/dL (ref 30.0–36.0)
MCV: 86.3 fL (ref 78.0–100.0)
PLATELETS: 249 10*3/uL (ref 150–400)
RBC: 3.14 MIL/uL — AB (ref 4.22–5.81)
RDW: 14.4 % (ref 11.5–15.5)
WBC: 9.5 10*3/uL (ref 4.0–10.5)

## 2016-10-26 LAB — CULTURE, BLOOD (ROUTINE X 2)
CULTURE: NO GROWTH
Culture: NO GROWTH

## 2016-10-26 LAB — GLUCOSE, CAPILLARY
Glucose-Capillary: 110 mg/dL — ABNORMAL HIGH (ref 65–99)
Glucose-Capillary: 202 mg/dL — ABNORMAL HIGH (ref 65–99)
Glucose-Capillary: 204 mg/dL — ABNORMAL HIGH (ref 65–99)
Glucose-Capillary: 152 mg/dL — ABNORMAL HIGH (ref 65–99)

## 2016-10-26 LAB — BASIC METABOLIC PANEL
Anion gap: 9 (ref 5–15)
BUN: 10 mg/dL (ref 6–20)
CHLORIDE: 104 mmol/L (ref 101–111)
CO2: 25 mmol/L (ref 22–32)
Calcium: 8.6 mg/dL — ABNORMAL LOW (ref 8.9–10.3)
Creatinine, Ser: 0.68 mg/dL (ref 0.61–1.24)
GFR calc Af Amer: >60 mL/min (ref >60)
GFR calc non Af Amer: >60 mL/min (ref >60)
GLUCOSE: 104 mg/dL — AB (ref 65–99)
POTASSIUM: 3.5 mmol/L (ref 3.5–5.1)
Sodium: 138 mmol/L (ref 135–145)

## 2016-10-26 LAB — VANCOMYCIN, TROUGH: Vancomycin Tr: 21 ug/mL (ref 15–20)

## 2016-10-26 MED ORDER — RIVAROXABAN 20 MG PO TABS
20.0000 mg | ORAL_TABLET | Freq: Every day | ORAL | Status: DC
  Administered 2016-10-26 – 2016-10-27 (×2): 20 mg via ORAL
  Filled 2016-10-26 (×2): qty 1

## 2016-10-26 NOTE — Progress Notes (Signed)
Inpatient Diabetes Program Recommendations  AACE/ADA: New Consensus Statement on Inpatient Glycemic Control (2015)  Target Ranges:  Prepandial:   less than 140 mg/dL      Peak postprandial:   less than 180 mg/dL (1-2 hours)      Critically ill patients:  140 - 180 mg/dL   Lab Results  Component Value Date   GLUCAP 202 (H) 10/26/2016   HGBA1C 7.3 (H) 10/21/2016    Review of Glycemic Control:  Results for Charles Parker, Charles Parker (MRN [id number]) as of 10/26/2016 13:25  Ref. Range 10/25/2016 06:23 10/25/2016 11:21 10/25/2016 17:00 10/25/2016 22:06 10/26/2016 06:52 10/26/2016 11:21  Glucose-Capillary Latest Ref Range: 65 - 99 mg/dL 121 (H) 183 (H) 275 (H) 174 (H) 110 (H) 202 (H)   Diabetes history: Type 2 diabetes Outpatient Diabetes medications: Lantus 25 units q HS Current orders for Inpatient glycemic control:  Lantus 25 units q HS, Novolog moderate tid with meals  Inpatient Diabetes Program Recommendations:   Agree with current orders.  May consider adding Novolog meal coverage 3 units tid with meals to cover CHO/meal intake.  Will follow.  Thanks, Adah Perl, RN, BC-ADM Inpatient Diabetes Coordinator Pager [phone] (8a-5p)

## 2016-10-26 NOTE — Progress Notes (Signed)
Pharmacy Antibiotic Note  Charles Parker is a 71 y.o. male admitted on 10/21/2016 with discitis/osteomyelitis.  Pharmacy has been consulted for Vancomycin dosing, MD dosing ceftriaxone.   Vancomycin trough drawn this am, resulted therapeutic at 21 after dose was given slightly late. Patient was receiving vancomycin 1g q8h. Per ID note, patient will continue antibiotics through March 1st. WBC has improved, patient has been afebrile and renal function remains stable.  1/19 epidural spec A cx: ngtd 1/19 Dural membran spec B: ngtd 1/18 BCx: ngtd 1/18 MRSA PCR: negative  Plan: Continue vancomycin 1g IV q8h  Follow Scr, cultures, progress VT at SS prn  Height: 6' (182.9 cm) Weight: 190 lb 14.7 oz (86.6 kg) IBW/kg (Calculated) : 77.6  Temp (24hrs), Avg:99.4 F (37.4 C), Min:99 F (37.2 C), Max:99.7 F (37.6 C)   Recent Labs Lab 10/22/16 0358 10/23/16 0305 10/24/16 0550 10/24/16 0629 10/25/16 0216 10/26/16 0733  WBC 6.4 6.9  --  12.1* 12.2* 9.5  CREATININE 0.70 0.67  --  0.70 0.72 0.68  VANCOTROUGH  --   --  10*  --   --  21*    Estimated Creatinine Clearance: 94.3 mL/min (by C-G formula based on SCr of 0.68 mg/dL).    Allergies  Allergen Reactions  . Metformin And Related Diarrhea  . Codeine Rash    All over the body     Andrey Cota. Diona Foley, PharmD, BCPS Clinical Pharmacist [phone] 10/26/2016 8:46 AM

## 2016-10-26 NOTE — Progress Notes (Signed)
ANTICOAGULATION CONSULT NOTE - Initial Consult  Pharmacy Consult for Xarelto Indication: history of DVT  Allergies  Allergen Reactions  . Metformin And Related Diarrhea  . Codeine Rash    All over the body    Patient Measurements: Height: 6' (182.9 cm) Weight: 190 lb 14.7 oz (86.6 kg) IBW/kg (Calculated) : 77.6  Vital Signs: Temp: 98.2 F (36.8 C) (01/23 1045) Temp Source: Oral (01/23 1045) BP: 129/64 (01/23 1045) Pulse Rate: 112 (01/23 1045)  Labs:  Recent Labs  10/24/16 0629 10/25/16 0216 10/26/16 0733  HGB 8.8* 8.9* 8.5*  HCT 28.1* 28.1* 27.1*  PLT 214 210 249  CREATININE 0.70 0.72 0.68    Estimated Creatinine Clearance: 94.3 mL/min (by C-G formula based on SCr of 0.68 mg/dL).   Medical History: Past Medical History:  Diagnosis Date  . Clotting disorder (HCC)    DVT  . Diabetes mellitus    type II  . Diverticulosis   . DVT (deep venous thrombosis) ([place])   . Glaucoma    no eye drops   . Hepatic cyst   . Hypercholesterolemia   . Hypertension   . Myocardial infarction    very mild long ago   . Renal cyst   . Sleep apnea   . Tubulovillous adenoma     Medications:  Prescriptions Prior to Admission  Medication Sig Dispense Refill Last Dose  . cefTRIAXone (ROCEPHIN) IVPB Inject 2 g into the vein every 12 (twelve) hours. Indication:  Ecoli bacteremia with small epidural abscess/myositis Last Day of Therapy:  11/07/16 Labs - Once weekly:  CBC/D and BMP, Labs - Every other week:  ESR and CRP 68 Units 0 10/21/2016 at Unknown time  . HYDROcodone-acetaminophen (NORCO/VICODIN) 5-325 MG tablet Take 1 tablet by mouth every 6 (six) hours as needed for moderate pain. 15 tablet 0 Past Week at Unknown time  . Insulin Pen Needle 31G X 5 MM MISC Administer lantus once nightly 90 each 0 10/20/2016 at Unknown time  . methocarbamol (ROBAXIN) 500 MG tablet Take 1 tablet (500 mg total) by mouth every 8 (eight) hours as needed for muscle spasms.   10/21/2016 at Unknown time   . Multiple Vitamin (MULTIVITAMIN WITH MINERALS) TABS tablet Take 1 tablet by mouth daily.   10/21/2016 at Unknown time  . polyethylene glycol (MIRALAX / GLYCOLAX) packet Take 17 g by mouth 2 (two) times daily. 14 each 0 10/21/2016 at Unknown time  . rivaroxaban (XARELTO) 20 MG TABS tablet Take 1 tablet (20 mg total) by mouth daily with supper. 90 tablet 0 10/20/2016 at 1900  . simvastatin (ZOCOR) 40 MG tablet TAKE 1 TABLET(40 MG) BY MOUTH EVERY EVENING 90 tablet 3 10/20/2016 at Unknown time  . tamsulosin (FLOMAX) 0.4 MG CAPS capsule Take 0.4 mg by mouth daily after breakfast.    10/21/2016 at Unknown time   Scheduled:  . cefTRIAXone (ROCEPHIN)  IV  2 g Intravenous Q12H  . docusate sodium  100 mg Oral BID  . feeding supplement (GLUCERNA SHAKE)  237 mL Oral BID BM  . insulin aspart  0-15 Units Subcutaneous TID WC  . insulin glargine  25 Units Subcutaneous QHS  . multivitamin with minerals  1 tablet Oral Daily  . polyethylene glycol  17 g Oral BID  . simvastatin  40 mg Oral q1800  . sodium chloride flush  3 mL Intravenous Q12H  . tamsulosin  0.4 mg Oral QPC breakfast  . vancomycin  1,000 mg Intravenous Q8H   Infusions:  . sodium chloride  Stopped (10/23/16 0700)  . lactated ringers 10 mL/hr at 10/23/16 0700    Assessment: 71yo male with history of DVT on xarelto PTA here for weakness. Pharmacy is consulted to restart xarelto for h/o DVT.   Goal of Therapy:  Monitor platelets by anticoagulation protocol: Yes   Plan:  Xarelto 57m PO daily Monitor s/sx of bleeding  CAndrey Cota BDiona Foley PharmD, BCPS Clinical Pharmacist #[phone]/23/2018,2:42 PM

## 2016-10-26 NOTE — Progress Notes (Signed)
Nutrition Follow-up  INTERVENTION:  Continue Glucerna Shake po BID, each supplement provides 220 kcal and 10 grams of protein   NUTRITION DIAGNOSIS:   Increased nutrient needs related to  (surgery) as evidenced by estimated needs.  ongoing  GOAL:   Patient will meet greater than or equal to 90% of their needs  Being met  MONITOR:   Supplement acceptance, Diet advancement, Labs, Weight trends, Skin, I & O's  REASON FOR ASSESSMENT:   Malnutrition Screening Tool    ASSESSMENT:   71 y.o. male with medical history significant for recent hospitalization 09/26/16 - 10/05/16 for sepsis due to E.Coli bacteremia and pyelonephritis as well as L4, L5 epidural abscess with osteomyelitis and phlegmon and was supposed to be on rocephin through 11/07/16. TEE was done 10/05/2016 and was negative for endocarditis. This time pt presented with progressive LE weakness but no fevers, no specific urinary concerns, no chest pain, no dyspnea, no abd concerns.  Pt with Osteomyelitis of thoracic vertebra (HCC) /  Thoracic discitis / Abscess in epidural space of lumbar spine / Thoracic spine fracture   Pt working with physical therapy at time of first visit. Per nurse tech, pt has been eating well. Per order history, pt is being given Glucerna Shakes twice daily with one refusal out of 7 administrations.  Attempted to visit pt again- nursing care in process at time of second visit.  Labs: low calcium, low hemoglobin, glucose ranging 104 to 275 mg/dL  Diet Order:  Diet Carb Modified Fluid consistency: Thin; Room service appropriate? Yes  Skin:  Reviewed, no issues (closed incision on back)  Last BM:  1/22  Height:   Ht Readings from Last 1 Encounters:  10/23/16 6' (1.829 m)    Weight:   Wt Readings from Last 1 Encounters:  10/26/16 190 lb 14.7 oz (86.6 kg)    Ideal Body Weight:  80.9 kg  BMI:  Body mass index is 25.89 kg/m.  Estimated Nutritional Needs:   Kcal:  1950-2150  Protein:   95-105 grams  Fluid:  1.9 - 2.1 L/day  EDUCATION NEEDS:   No education needs identified at this time    RD, CSP, LDN Inpatient Clinical Dietitian Pager: [phone] After Hours Pager: [phone]  

## 2016-10-26 NOTE — Progress Notes (Signed)
Physical Therapy Treatment Patient Details Name: Charles Parker MRN: [id number] DOB: 03/27/1946 Today's Date: 10/26/2016    History of Present Illness 71 yo male s/p T8, T9 discitis with osteomyelitis and epidural abscess. L4-5 - small abscess noted in new area though other areas resolved which is the site of the previously identified epidural abscess. T6-11 decompression PLA and fixation with I &D. recent admission 12/24 due to pyelonephritis with sepsis and L4, L5 epidural abscess with osteomyelitis.    PT Comments    Patient progressing slowly due to LE weakness and some decreased sensation.  Feel he may need extended rehab prior to d/c home alone.  Agree with SNF level rehab prior to d/c home.  PT to continue until d/c.  Follow Up Recommendations  SNF     Equipment Recommendations  Other (comment) (TBA at next venue)    Recommendations for Other Services       Precautions / Restrictions Precautions Precautions: Fall;Back Required Braces or Orthoses: Spinal Brace Spinal Brace: Thoracolumbosacral orthotic    Mobility  Bed Mobility Overal bed mobility: Needs Assistance Bed Mobility: Sit to Sidelying         Sit to sidelying: +2 for safety/equipment;Mod assist General bed mobility comments: assist for legs into bed; cues for managing upper body and hand placement, assist of second for IV, catheter, etc  Transfers Overall transfer level: Needs assistance Equipment used: Rolling walker (2 wheeled) Transfers: Sit to/from Stand Sit to Stand: Max assist;+2 physical assistance   Squat pivot transfers: Max assist;+2 physical assistance     General transfer comment: able to stand from low surface of chair, but with poor LE awareness, knees buckling and hips flexing, assist and cues to maintain hip and knee extension.  Pivot chair to bed lifting from pad under pt to clear hips from arm of chair to bed   Ambulation/Gait             General Gait Details: unable to  take steps due to LE weakness   Stairs            Wheelchair Mobility    Modified Rankin (Stroke Patients Only)       Balance Overall balance assessment: Needs assistance Sitting-balance support: Feet supported Sitting balance-Leahy Scale: Poor Sitting balance - Comments: at least min A sitting edge of chair with TLSO donned   Standing balance support: Bilateral upper extremity supported Standing balance-Leahy Scale: Zero Standing balance comment: max support with heavy UE support on walker for balance                    Cognition Arousal/Alertness: Awake/alert Behavior During Therapy: WFL for tasks assessed/performed Overall Cognitive Status: Within Functional Limits for tasks assessed                      Exercises General Exercises - Lower Extremity Ankle Circles/Pumps: AROM;Both;10 reps;Seated    General Comments        Pertinent Vitals/Pain Pain Score: 6  Pain Location: back with mobility Pain Descriptors / Indicators: Grimacing;Guarding Pain Intervention(s): Monitored during session;Limited activity within patient's tolerance;Repositioned    Home Living                      Prior Function            PT Goals (current goals can now be found in the care plan section) Progress towards PT goals: Progressing toward goals    Frequency  Min 5X/week      PT Plan Discharge plan needs to be updated    Co-evaluation             End of Session Equipment Utilized During Treatment: Gait belt;Back brace Activity Tolerance: Patient tolerated treatment well Patient left: with call bell/phone within reach;in bed;with bed alarm set     Time: [id number] PT Time Calculation (min) (ACUTE ONLY): 25 min  Charges:  $Therapeutic Activity: 23-37 mins                    G CodesReginia Naas 05-Nov-2016, 5:09 PM  Magda Kiel, [place] 2016-11-05

## 2016-10-26 NOTE — Progress Notes (Signed)
CRITICAL VALUE ALERT  Critical value received:  vanc trough  Date of notification:  10/26/16  Time of notification:  0845  Critical value read back:Yes  Nurse who received alert:  Jalayna Josten  MD notified (1st page):  Dr Doyle Askew  Time of first page:  0905  MD notified (2nd page):Pharmacist  [facility] notified.  Time of second page:  Responding MD:    Time MD responded:

## 2016-10-26 NOTE — Progress Notes (Signed)
Patient ID: Charles Parker, male   DOB: 10-28-1945, 71 y.o.   MRN: [id number] BP 139/86 (BP Location: Left Arm)   Pulse 99   Temp 98.8 F (37.1 C) (Oral)   Resp 17   Ht 6' (1.829 m)   Wt 86.6 kg (190 lb 14.7 oz)   SpO2 96%   BMI 25.89 kg/m  Alert and oriented x 4. Speech is clear and fluent Some progress in neurologic function.  No growth in multiple cultures.

## 2016-10-26 NOTE — Progress Notes (Signed)
Patient ID: Charles Parker, male   DOB: [birth year], 71 y.o.   MRN: [id number]    PROGRESS NOTE  Charles Parker  [id number] DOB: July 04, 1946 DOA: 10/21/2016  PCP: Scarlette Calico, MD   HPI: 71 y.o. male with medical history significant for recent hospitalization 09/26/16 - 10/05/16 for sepsis due to E.Coli bacteremia and pyelonephritis as well as L4, L5 epidural abscess with osteomyelitis and phlegmon and was supposed to be on rocephin through 11/07/16. TEE was done 10/05/2016 and was negative for endocarditis. This time pt presented with progressive LE weakness but no fevers, no specific urinary concerns, no chest pain, no dyspnea, no abd concerns.   Assessment and plan:  Osteomyelitis of thoracic vertebra [facility]) /  Thoracic discitis / Abscess in epidural space of lumbar spine / Thoracic spine fracture  - MRI thoracic and lumbar spine consistent with discitis and osteomyelitis with endplate destructive change at the T8-9 interspace. Rim enhancing fluid within the anterior aspect of the intervertebral disc consistent with abscess. New small fluid collection posterior to the left facet joint worrisome for abscess, possible septic facet joints but could be due to degenerative disease.  - Also seen on CT scan complex fracture at T8-9 with endplate destruction of both levels and possible discitis, osteomyelitis with extensive paraspinal density potentially from hematoma or phlegmon.  - s/p thoracic 8th, 9th, decompression, discectomy, post lateral arthrodesis T6-T11, post fixation t6-T11, post op day #3 - stable this AM in ICU - continue vancomycin day #5 and continue Rocephin as per previous home regimen - ID team consulted for assistance, appreciate help  - pt with low grade fever overnight 99 F and with WBC up from 6.9 --> 12.1 --> 12.2 --> 9.5 - per ID, antibiotics for 6 weeks through March 1st - Leave in picc line at the end of treatment until he has follow up in ID clinic, this will be arranged  -  CBC in AM  Active Problems:   Benign prostatic hyperplasia (BPH) with straining on urination - Continue Flomax     DVT, lower extremity, proximal (HCC) - Xarelto on hold as Hg down post op from 10.3 --> 8.8 --> 8.9 --> 8.5 - will try to resume Xarelto today and monitor counts     Diabetes mellitus, with long-term current use of insulin (HCC) - Continue Lantus 25 units at bedtime along with sliding scale insulin - A1C 7.3    Dyslipidemia associated with type 2 diabetes mellitus ([place])  - Resume statin therapy   DVT prophylaxis: Xarelto resumed  Code Status: Full  Family Communication: Patient at bedside  Disposition Plan: SNF when neurosurgery clears and if Hg stable on Xarelto   Consultants:   Neurosurgery   ID  Procedures:   10/22/2016 - Thoracic 8th, 9th, decompression, discectomy, post lateral arthrodesis T6-T11, post fixation t6-T11  Antimicrobials:   Vancomycin 1/18 -->  Rocephin resumed from previous home regimen -->  Subjective: No events overnight.   Objective: Vitals:   10/26/16 0102 10/26/16 0447 10/26/16 0550 10/26/16 1045  BP: 129/76  117/62 129/64  Pulse: 97  96 (!) 112  Resp: 18  18 16   Temp:   99.4 F (37.4 C) 98.2 F (36.8 C)  TempSrc:   Oral Oral  SpO2: 97%  100% 100%  Weight:  86.6 kg (190 lb 14.7 oz)    Height:        Intake/Output Summary (Last 24 hours) at 10/26/16 1439 Last data filed at 10/26/16 0553  Gross per 24  hour  Intake                0 ml  Output             2500 ml  Net            -2500 ml   Filed Weights   10/24/16 0500 10/25/16 0442 10/26/16 0447  Weight: 75.5 kg (166 lb 7.2 oz) 84.4 kg (186 lb 1.1 oz) 86.6 kg (190 lb 14.7 oz)    Examination:  General exam: Appears calm and comfortable  Respiratory system: Respiratory effort normal. Cardiovascular system: S1 & S2 heard, RRR. No JVD, murmurs, rubs, gallops or clicks. No pedal edema. Gastrointestinal system: Abdomen is nondistended, soft and nontender. No  organomegaly or masses felt. Normal bowel sounds heard. Central nervous system: Alert and oriented. LE weakness bilaterally 2/5  Data Reviewed: I have personally reviewed following labs and imaging studies  CBC:  Recent Labs Lab 10/21/16 1258 10/22/16 0358 10/23/16 0305 10/24/16 0629 10/25/16 0216 10/26/16 0733  WBC 6.5 6.4 6.9 12.1* 12.2* 9.5  NEUTROABS 4.6  --   --   --   --   --   HGB 10.0* 9.9* 10.3* 8.8* 8.9* 8.5*  HCT 32.1* 31.9* 32.9* 28.1* 28.1* 27.1*  MCV 88.4 88.1 87.0 86.5 86.7 86.3  PLT 236 220 225 214 210 0000000   Basic Metabolic Panel:  Recent Labs Lab 10/22/16 0358 10/23/16 0305 10/24/16 0629 10/25/16 0216 10/26/16 0733  NA 140 137 135 136 138  K 3.9 3.9 3.6 3.9 3.5  CL 102 102 101 103 104  CO2 28 26 27 25 25   GLUCOSE 91 209* 163* 160* 104*  BUN 9 8 12 12 10   CREATININE 0.70 0.67 0.70 0.72 0.68  CALCIUM 9.1 8.9 8.4* 8.4* 8.6*  MG  --   --   --  1.7  --    Liver Function Tests:  Recent Labs Lab 10/21/16 1258  AST 22  ALT 16*  ALKPHOS 56  BILITOT 0.3  PROT 7.4  ALBUMIN 2.5*    Recent Labs Lab 10/21/16 1258  LIPASE 15   CBG:  Recent Labs Lab 10/25/16 1121 10/25/16 1700 10/25/16 2206 10/26/16 0652 10/26/16 1121  GLUCAP 183* 275* 174* 110* 202*   Urine analysis:    Component Value Date/Time   COLORURINE YELLOW 10/21/2016 1312   APPEARANCEUR TURBID (A) 10/21/2016 1312   LABSPEC 1.015 10/21/2016 1312   PHURINE 8.0 10/21/2016 1312   GLUCOSEU NEGATIVE 10/21/2016 1312   GLUCOSEU NEGATIVE 09/08/2016 1513   HGBUR MODERATE (A) 10/21/2016 1312   BILIRUBINUR NEGATIVE 10/21/2016 1312   BILIRUBINUR negative 08/17/2016 1359   KETONESUR NEGATIVE 10/21/2016 1312   PROTEINUR NEGATIVE 10/21/2016 1312   UROBILINOGEN 0.2 09/08/2016 1513   NITRITE NEGATIVE 10/21/2016 1312   LEUKOCYTESUR NEGATIVE 10/21/2016 1312    Recent Results (from the past 240 hour(s))  Surgical pcr screen     Status: None   Collection Time: 10/22/16  6:57 AM  Result  Value Ref Range Status   MRSA, PCR NEGATIVE NEGATIVE Final   Staphylococcus aureus NEGATIVE NEGATIVE Final    Radiology Studies: No results found.  Scheduled Meds: . cefTRIAXone (ROCEPHIN)  IV  2 g Intravenous Q12H  . docusate sodium  100 mg Oral BID  . feeding supplement (GLUCERNA SHAKE)  237 mL Oral BID BM  . insulin aspart  0-15 Units Subcutaneous TID WC  . insulin glargine  25 Units Subcutaneous QHS  . multivitamin with minerals  1  tablet Oral Daily  . polyethylene glycol  17 g Oral BID  . simvastatin  40 mg Oral q1800  . sodium chloride flush  3 mL Intravenous Q12H  . tamsulosin  0.4 mg Oral QPC breakfast  . vancomycin  1,000 mg Intravenous Q8H   Continuous Infusions: . sodium chloride Stopped (10/23/16 0700)  . lactated ringers 10 mL/hr at 10/23/16 0700     LOS: 5 days   Time spent: 20 minutes   Faye Ramsay, MD [facility] Pager [phone]  If 7PM-7AM, please contact night-coverage [email or web address] Password [email or web address] 10/26/2016, 2:39 PM

## 2016-10-26 NOTE — Progress Notes (Signed)
Patient not a candidate for CIR. MD, patient and his brother [name] aware. Patient and brother are interested in him going back to [facility] for rehab and IV antibiotics. MD feels he should be ready tomorrow. CSW informed. CM continuing to follow.

## 2016-10-27 DIAGNOSIS — Z794 Long term (current) use of insulin: Secondary | ICD-10-CM | POA: Diagnosis not present

## 2016-10-27 DIAGNOSIS — Z981 Arthrodesis status: Secondary | ICD-10-CM | POA: Diagnosis not present

## 2016-10-27 DIAGNOSIS — R509 Fever, unspecified: Secondary | ICD-10-CM | POA: Diagnosis not present

## 2016-10-27 DIAGNOSIS — M4324 Fusion of spine, thoracic region: Secondary | ICD-10-CM | POA: Diagnosis not present

## 2016-10-27 DIAGNOSIS — M546 Pain in thoracic spine: Secondary | ICD-10-CM | POA: Diagnosis not present

## 2016-10-27 DIAGNOSIS — E0800 Diabetes mellitus due to underlying condition with hyperosmolarity without nonketotic hyperglycemic-hyperosmolar coma (NKHHC): Secondary | ICD-10-CM | POA: Diagnosis not present

## 2016-10-27 DIAGNOSIS — B372 Candidiasis of skin and nail: Secondary | ICD-10-CM | POA: Diagnosis not present

## 2016-10-27 DIAGNOSIS — M6281 Muscle weakness (generalized): Secondary | ICD-10-CM | POA: Diagnosis not present

## 2016-10-27 DIAGNOSIS — R531 Weakness: Secondary | ICD-10-CM | POA: Diagnosis not present

## 2016-10-27 DIAGNOSIS — K5909 Other constipation: Secondary | ICD-10-CM | POA: Diagnosis not present

## 2016-10-27 DIAGNOSIS — G062 Extradural and subdural abscess, unspecified: Secondary | ICD-10-CM | POA: Diagnosis not present

## 2016-10-27 DIAGNOSIS — D509 Iron deficiency anemia, unspecified: Secondary | ICD-10-CM | POA: Diagnosis not present

## 2016-10-27 DIAGNOSIS — M25562 Pain in left knee: Secondary | ICD-10-CM | POA: Diagnosis not present

## 2016-10-27 DIAGNOSIS — G4700 Insomnia, unspecified: Secondary | ICD-10-CM | POA: Diagnosis not present

## 2016-10-27 DIAGNOSIS — R1310 Dysphagia, unspecified: Secondary | ICD-10-CM | POA: Diagnosis not present

## 2016-10-27 DIAGNOSIS — R2681 Unsteadiness on feet: Secondary | ICD-10-CM | POA: Diagnosis not present

## 2016-10-27 DIAGNOSIS — M549 Dorsalgia, unspecified: Secondary | ICD-10-CM | POA: Diagnosis not present

## 2016-10-27 DIAGNOSIS — E1169 Type 2 diabetes mellitus with other specified complication: Secondary | ICD-10-CM | POA: Diagnosis not present

## 2016-10-27 DIAGNOSIS — M4644 Discitis, unspecified, thoracic region: Secondary | ICD-10-CM | POA: Diagnosis not present

## 2016-10-27 DIAGNOSIS — E785 Hyperlipidemia, unspecified: Secondary | ICD-10-CM | POA: Diagnosis not present

## 2016-10-27 DIAGNOSIS — R29898 Other symptoms and signs involving the musculoskeletal system: Secondary | ICD-10-CM | POA: Diagnosis not present

## 2016-10-27 DIAGNOSIS — D62 Acute posthemorrhagic anemia: Secondary | ICD-10-CM | POA: Diagnosis not present

## 2016-10-27 DIAGNOSIS — G8918 Other acute postprocedural pain: Secondary | ICD-10-CM | POA: Diagnosis not present

## 2016-10-27 DIAGNOSIS — I10 Essential (primary) hypertension: Secondary | ICD-10-CM | POA: Diagnosis not present

## 2016-10-27 DIAGNOSIS — E119 Type 2 diabetes mellitus without complications: Secondary | ICD-10-CM | POA: Diagnosis not present

## 2016-10-27 DIAGNOSIS — R488 Other symbolic dysfunctions: Secondary | ICD-10-CM | POA: Diagnosis not present

## 2016-10-27 DIAGNOSIS — M4624 Osteomyelitis of vertebra, thoracic region: Secondary | ICD-10-CM | POA: Diagnosis not present

## 2016-10-27 DIAGNOSIS — R079 Chest pain, unspecified: Secondary | ICD-10-CM | POA: Diagnosis not present

## 2016-10-27 DIAGNOSIS — G061 Intraspinal abscess and granuloma: Secondary | ICD-10-CM | POA: Diagnosis not present

## 2016-10-27 DIAGNOSIS — M792 Neuralgia and neuritis, unspecified: Secondary | ICD-10-CM | POA: Diagnosis not present

## 2016-10-27 DIAGNOSIS — N401 Enlarged prostate with lower urinary tract symptoms: Secondary | ICD-10-CM | POA: Diagnosis not present

## 2016-10-27 DIAGNOSIS — G8222 Paraplegia, incomplete: Secondary | ICD-10-CM | POA: Diagnosis not present

## 2016-10-27 DIAGNOSIS — Z9181 History of falling: Secondary | ICD-10-CM | POA: Diagnosis not present

## 2016-10-27 DIAGNOSIS — I824Y9 Acute embolism and thrombosis of unspecified deep veins of unspecified proximal lower extremity: Secondary | ICD-10-CM | POA: Diagnosis not present

## 2016-10-27 LAB — BASIC METABOLIC PANEL
Anion gap: 11 (ref 5–15)
BUN: 10 mg/dL (ref 6–20)
CALCIUM: 8.8 mg/dL — AB (ref 8.9–10.3)
CO2: 27 mmol/L (ref 22–32)
CREATININE: 0.63 mg/dL (ref 0.61–1.24)
Chloride: 102 mmol/L (ref 101–111)
Glucose, Bld: 153 mg/dL — ABNORMAL HIGH (ref 65–99)
Potassium: 3.5 mmol/L (ref 3.5–5.1)
SODIUM: 140 mmol/L (ref 135–145)

## 2016-10-27 LAB — CBC
HEMATOCRIT: 26.8 % — AB (ref 39.0–52.0)
Hemoglobin: 8.6 g/dL — ABNORMAL LOW (ref 13.0–17.0)
MCH: 27.4 pg (ref 26.0–34.0)
MCHC: 32.1 g/dL (ref 30.0–36.0)
MCV: 85.4 fL (ref 78.0–100.0)
PLATELETS: 272 10*3/uL (ref 150–400)
RBC: 3.14 MIL/uL — ABNORMAL LOW (ref 4.22–5.81)
RDW: 14.1 % (ref 11.5–15.5)
WBC: 8 10*3/uL (ref 4.0–10.5)

## 2016-10-27 LAB — GLUCOSE, CAPILLARY
Glucose-Capillary: 144 mg/dL — ABNORMAL HIGH (ref 65–99)
Glucose-Capillary: 178 mg/dL — ABNORMAL HIGH (ref 65–99)
Glucose-Capillary: 215 mg/dL — ABNORMAL HIGH (ref 65–99)

## 2016-10-27 LAB — AEROBIC/ANAEROBIC CULTURE W GRAM STAIN (SURGICAL/DEEP WOUND): Culture: NO GROWTH

## 2016-10-27 LAB — AEROBIC/ANAEROBIC CULTURE (SURGICAL/DEEP WOUND)
CULTURE: NO GROWTH
CULTURE: NO GROWTH

## 2016-10-27 MED ORDER — HYDROCODONE-ACETAMINOPHEN 5-325 MG PO TABS: 1.0000 | ORAL_TABLET | Freq: Four times a day (QID) | ORAL | 0 refills | Status: DC | PRN

## 2016-10-27 MED ORDER — CEFTRIAXONE IV (FOR PTA / DISCHARGE USE ONLY)
2.0000 g | Freq: Two times a day (BID) | INTRAVENOUS | 0 refills | Status: DC
Start: 2016-10-27 — End: 2016-11-25

## 2016-10-27 MED ORDER — VANCOMYCIN IV (FOR PTA / DISCHARGE USE ONLY): 1000.0000 mg | Freq: Three times a day (TID) | INTRAVENOUS | 0 refills | Status: DC

## 2016-10-27 MED ORDER — INSULIN GLARGINE 100 UNIT/ML ~~LOC~~ SOLN: 15.0000 [IU] | Freq: Every day | SUBCUTANEOUS | 0 refills | Status: DC

## 2016-10-27 MED ORDER — BISACODYL 5 MG PO TBEC: 5.0000 mg | DELAYED_RELEASE_TABLET | Freq: Every day | ORAL | 0 refills | Status: DC | PRN

## 2016-10-27 MED ORDER — DOCUSATE SODIUM 100 MG PO CAPS: 100.0000 mg | ORAL_CAPSULE | Freq: Two times a day (BID) | ORAL | 0 refills | Status: DC

## 2016-10-27 MED ORDER — ZOLPIDEM TARTRATE 5 MG PO TABS: 5.0000 mg | ORAL_TABLET | Freq: Every evening | ORAL | 0 refills | Status: DC | PRN

## 2016-10-27 MED ORDER — HEPARIN SOD (PORK) LOCK FLUSH 100 UNIT/ML IV SOLN
250.0000 [IU] | INTRAVENOUS | Status: AC | PRN
  Administered 2016-10-27: 250 [IU]

## 2016-10-27 NOTE — Progress Notes (Signed)
Patient ID: Charles Parker, male   DOB: 1945/10/15, 71 y.o.   MRN: [id number]  BP 131/74 (BP Location: Left Arm)   Pulse 91   Temp 98.5 F (36.9 C) (Oral)   Resp 20   Ht 6' (1.829 m)   Wt 86.6 kg (190 lb 14.7 oz)   SpO2 99%   BMI 25.89 kg/m  Ok to transfer to rehab.

## 2016-10-27 NOTE — Progress Notes (Signed)
Patient is being d/c to a skilled nursing home. Report called to the receiving nurse.

## 2016-10-27 NOTE — Discharge Instructions (Signed)

## 2016-10-27 NOTE — Discharge Summary (Signed)
Physician Discharge Summary  BREVON DEWALD IPJ:[id number] DOB: November 08, 1945 DOA: 10/21/2016  PCP: Scarlette Calico, MD  Admit date: 10/21/2016 Discharge date: 10/27/2016  Recommendations for Outpatient Follow-up:  1. Pt will need to follow up with PCP in 1-2 weeks post discharge 2. Please obtain BMP to evaluate electrolytes and kidney function 3. Please also check CBC to evaluate Hg and Hct levels 4. Please see instructions on Antibiotic treatment under medication section  5. Follow up with ID specialist will be arranged and pt will be notified  Discharge Diagnoses:  Principal Problem:   Osteomyelitis of thoracic vertebra [facility]) Active Problems:   Thoracic discitis   Abscess in epidural space of lumbar spine   Benign prostatic hyperplasia (BPH) with straining on urination   DVT, lower extremity, proximal ([place])  Discharge Condition: Stable  Diet recommendation: Heart healthy diet discussed in details   HPI: 71 y.o.malewith medical history significant for recent hospitalization 09/26/16 - 10/05/16 for sepsis due to E.Coli bacteremia and pyelonephritis as well as L4, L5 epidural abscess with osteomyelitis and phlegmon and was supposed to be on rocephin through 11/07/16. TEE was done 10/05/2016 and was negative for endocarditis. This time pt presented with progressive LE weakness but no fevers, no specific urinary concerns, no chest pain, no dyspnea, no abd concerns.   Assessment and plan:  Osteomyelitis of thoracic vertebra (HCC) / Thoracic discitis /Abscess in epidural space of lumbar spine / Thoracic spine fracture  - MRI thoracic and lumbar spine consistent withdiscitis and osteomyelitis with endplate destructive change at theT8-9 interspace. Rim enhancing fluid within the anterior aspect of the intervertebral disc consistent with abscess. New small fluid collection posterior to the left facet joint worrisome for abscess, possible septic facet joints but could be due to degenerative  disease.  - Also seen on CT scan complexfracture at T8-9 with endplate destruction of both levels and possible discitis, osteomyelitis with extensive paraspinal density potentially from hematoma or phlegmon.  - s/p thoracic 8th, 9th, decompression, discectomy, post lateral arthrodesis T6-T11, post fixation t6-T11, post op day #4 - stable this AM in ICU - continue vancomycin day #5 and continue Rocephin as per previous home regimen - ID team consulted for assistance, appreciate help  - pt with low grade fever overnight 99 F and with WBC up from 6.9 --> 12.1 --> 12.2 --> 9.5 --> 8.0 - per ID, antibiotics for 6 weeks through March 1st, please see instructions below  - Leave in picc line at the end of treatment until he has follow up in ID clinic, this will be arranged   Active Problems: Benign prostatic hyperplasia (BPH) with straining on urination - Continue Flomax   DVT, lower extremity, proximal ([place]) - Xarelto resumed 1/23 - Hg trend post op: 10.3 --> 8.8 --> 8.9 --> 8.5 --> 8.6 - will need to monitor counts in an outpatient setting   Diabetes mellitus, with long-term current use of insulin ([place]) - Continue Lantus 15 units at bedtime upon discharge  - A1C 7.3  Dyslipidemia associated with type 2 diabetes mellitus ([place]) - Resume statin therapy   DVT prophylaxis: Xarelto resumed  Code Status: Full  Family Communication: Patient at bedside  Disposition Plan: SNF   Consultants:   Neurosurgery   ID  Procedures:   10/22/2016 - Thoracic 8th, 9th, decompression, discectomy, post lateral arthrodesis T6-T11, post fixation t6-T11  Antimicrobials:   Vancomycin 1/18 -->  Rocephin resumed from previous home regimen -->  Procedures/Studies: Dg Thoracic Spine 2 View  Result Date:  10/22/2016 CLINICAL DATA:  Thoracic decompression and posterior-lateral arthrodesis. EXAM: THORACIC SPINE 2 VIEWS; DG C-ARM 61-120 MIN COMPARISON:  MRI thoracic spine January 18th.  FLUOROSCOPY TIME:  62 seconds FINDINGS: Three intraoperative fluoroscopic spot views submitted, interpreting radiologist was not present at time of procedure. Initial radiograph demonstrates pedicle screws at 4 levels. Last image demonstrates two consecutive level pedicle screw fixation. IMPRESSION: Thoracic fusion. Electronically Signed   By: Elon Alas M.D.   On: 10/22/2016 21:23   Dg Thoracic Spine 2 View  Result Date: 10/07/2016 CLINICAL DATA:  [place] this morning, having mid/lower back pain, upper back/neck pain EXAM: THORACIC SPINE 2 VIEWS COMPARISON:  Chest radiographs 09/26/2016 FINDINGS: Twelve pairs of ribs. Bones demineralized. Compression fracture mid thoracic spine with 50% height loss of T8 vertebral body, unchanged. Remaining vertebral body heights maintained. Endplate spur formation lower thoracic spine. No definite acute fracture, subluxation, or bone destruction. IMPRESSION: Chronic compression fracture T8 vertebral body. No acute abnormalities. Electronically Signed   By: Lavonia Dana M.D.   On: 10/07/2016 08:09   Dg Lumbar Spine Complete  Result Date: 10/07/2016 CLINICAL DATA:  [place] this morning, having mid/lower back pain, upper back/neck pain EXAM: LUMBAR SPINE - COMPLETE 4+ VIEW COMPARISON:  MRI lumbar spine 09/30/2016 FINDINGS: Diffuse osseous demineralization. Five non-rib-bearing lumbar vertebra. Scattered mild degenerative disc disease changes with small endplate spurs at several levels. Vertebral body heights maintained without fracture or subluxation. No bone destruction or spondylolysis. SI joints preserved. IMPRESSION: Mild degenerative disc disease changes lumbar spine. No acute abnormalities. Electronically Signed   By: Lavonia Dana M.D.   On: 10/07/2016 08:07   Ct Head Wo Contrast  Result Date: 10/07/2016 CLINICAL DATA:  Recent fall with head injury and neck pain, initial encounter EXAM: CT HEAD WITHOUT CONTRAST CT CERVICAL SPINE WITHOUT CONTRAST TECHNIQUE: Multidetector  CT imaging of the head and cervical spine was performed following the standard protocol without intravenous contrast. Multiplanar CT image reconstructions of the cervical spine were also generated. COMPARISON:  None. FINDINGS: CT HEAD FINDINGS Brain: Mild atrophic changes are noted. A cavum septum pellucidum is seen. No findings to suggest acute hemorrhage, acute infarction or space-occupying mass lesion are noted. Vascular: No hyperdense vessel or unexpected calcification. Skull: Normal. Negative for fracture or focal lesion. Sinuses/Orbits: No acute finding. Other: None. CT CERVICAL SPINE FINDINGS Alignment: Vertebral body alignment is within normal limits. Skull base and vertebrae: 7 cervical segments are well visualized. Osteophytic changes are noted at all levels. Mild facet hypertrophic changes are seen as well. No fracture or acute facet abnormality is noted. Soft tissues and spinal canal: Within normal limits. Disc levels: Disc space narrowing is noted worst at C3-4 and C5-6. As described above multilevel osteophytic changes are noted. Upper chest: Within normal limits.  Right-sided PICC line is seen. Other: None IMPRESSION: CT of the head: Atrophic changes are noted without acute intracranial abnormality. CT of cervical spine: Degenerative change without acute abnormality. Electronically Signed   By: Inez Catalina M.D.   On: 10/07/2016 08:36   Ct Cervical Spine Wo Contrast  Result Date: 10/07/2016 CLINICAL DATA:  Recent fall with head injury and neck pain, initial encounter EXAM: CT HEAD WITHOUT CONTRAST CT CERVICAL SPINE WITHOUT CONTRAST TECHNIQUE: Multidetector CT imaging of the head and cervical spine was performed following the standard protocol without intravenous contrast. Multiplanar CT image reconstructions of the cervical spine were also generated. COMPARISON:  None. FINDINGS: CT HEAD FINDINGS Brain: Mild atrophic changes are noted. A cavum septum pellucidum is seen.  No findings to suggest acute  hemorrhage, acute infarction or space-occupying mass lesion are noted. Vascular: No hyperdense vessel or unexpected calcification. Skull: Normal. Negative for fracture or focal lesion. Sinuses/Orbits: No acute finding. Other: None. CT CERVICAL SPINE FINDINGS Alignment: Vertebral body alignment is within normal limits. Skull base and vertebrae: 7 cervical segments are well visualized. Osteophytic changes are noted at all levels. Mild facet hypertrophic changes are seen as well. No fracture or acute facet abnormality is noted. Soft tissues and spinal canal: Within normal limits. Disc levels: Disc space narrowing is noted worst at C3-4 and C5-6. As described above multilevel osteophytic changes are noted. Upper chest: Within normal limits.  Right-sided PICC line is seen. Other: None IMPRESSION: CT of the head: Atrophic changes are noted without acute intracranial abnormality. CT of cervical spine: Degenerative change without acute abnormality. Electronically Signed   By: Inez Catalina M.D.   On: 10/07/2016 08:36   Mr Thoracic Spine W Wo Contrast  Result Date: 10/21/2016 CLINICAL DATA:  Diabetic patient with a recent history of discitis and epidural abscess. Increasing left worse than right lower extremity weakness. EXAM: MRI THORACIC AND LUMBAR SPINE WITHOUT AND WITH CONTRAST TECHNIQUE: Multiplanar and multiecho pulse sequences of the thoracic and lumbar spine were obtained without and with intravenous contrast. CONTRAST:  17 ml MULTIHANCE GADOBENATE DIMEGLUMINE 529 MG/ML IV SOLN COMPARISON:  MRI lumbar spine 09/30/2016. CT abdomen and pelvis this same day. FINDINGS: MRI THORACIC SPINE FINDINGS Alignment:  Maintained. Vertebrae: Endplate destructive change is seen about the T8-9 level with marked marrow edema and enhancement throughout both vertebral bodies consistent with discitis and osteomyelitis. Edema and enhancement are also present of the T8 and T9 facets and pedicles. Extensive paraspinous inflammatory  change is present about the T8 and T9 vertebral bodies. Cord:  Demonstrates normal signal. Paraspinal and other soft tissues: Small bilateral pleural effusions are seen. Disc levels: T8-9: No epidural abscess is seen. There is extensive enhancement of epidural soft tissues and a disc bulge deforming the cord at T8-9. Rim enhancing fluid within the anterior aspect of the intervertebral disc space consistent with abscess measures 1.8 cm transverse by 0.9 cm AP by 0.9 cm craniocaudal. T5-6 and T6-7: Small left paracentral protrusions without central canal or foraminal stenosis. MRI LUMBAR SPINE FINDINGS Segmentation:  Standard. Alignment: Trace retrolisthesis L1 on L2 is unchanged. Otherwise maintained. Vertebrae: Heterogeneous marrow signal throughout is again seen. No fracture. Conus medullaris: Extends to the T12-L1 level and appears normal. Paraspinal and other soft tissues: Multiple bilateral renal cysts are again seen. Disc levels: L1-2: Trace retrolisthesis.  Central canal and foramina are open. L2-3:  Mild facet degenerative change.  Otherwise negative. L3-4: Mild to moderate facet arthropathy and is very small disc bulge. The central canal and foramina are open. L4-5: Advanced bilateral facet degenerative change is seen. Small epidural fluid collection subjacent to the ligamentum flavum on the right is no longer visualized. Fluid collection in the posterior paraspinous musculature on the left adjacent to the L4 spinous process has also resolved. There is marrow edema and mild enhancement in the L4 and L5 pedicles and facets, not markedly changed. A new small fluid collection off the posterior aspect of the left facet joint measures 0.6 cm transverse by 0.9 cm AP by 0.7 cm craniocaudal. Posterior epidural soft tissues at this level are edematous and enhancing. L5-S1: Facet arthropathy. Small annular fissure and shallow bulge. The central canal and foramina are open. IMPRESSION: Findings consistent with discitis  and osteomyelitis with endplate  destructive change about the T8-9 interspace. Extensive paraspinous inflammatory change extends into the epidural space and there is a disc bulge at this level mildly deforming the cord. No epidural abscess is identified. Rim enhancing fluid within the anterior aspect of the intervertebral disc space as described above is consistent with abscess. Two small fluid collections at L4-5 seen on the prior examination have resolved. However, there is a new small fluid collection posterior to the left facet joint worrisome for abscess as described above. Marrow edema and enhancement in the pedicles and facets bilaterally is most worrisome for septic facet joints but could be due to degenerative disease. The central canal is open at L4-5. Electronically Signed   By: Inge Rise M.D.   On: 10/21/2016 16:23   Mr Lumbar Spine W Wo Contrast  Result Date: 10/21/2016 CLINICAL DATA:  Diabetic patient with a recent history of discitis and epidural abscess. Increasing left worse than right lower extremity weakness. EXAM: MRI THORACIC AND LUMBAR SPINE WITHOUT AND WITH CONTRAST TECHNIQUE: Multiplanar and multiecho pulse sequences of the thoracic and lumbar spine were obtained without and with intravenous contrast. CONTRAST:  17 ml MULTIHANCE GADOBENATE DIMEGLUMINE 529 MG/ML IV SOLN COMPARISON:  MRI lumbar spine 09/30/2016. CT abdomen and pelvis this same day. FINDINGS: MRI THORACIC SPINE FINDINGS Alignment:  Maintained. Vertebrae: Endplate destructive change is seen about the T8-9 level with marked marrow edema and enhancement throughout both vertebral bodies consistent with discitis and osteomyelitis. Edema and enhancement are also present of the T8 and T9 facets and pedicles. Extensive paraspinous inflammatory change is present about the T8 and T9 vertebral bodies. Cord:  Demonstrates normal signal. Paraspinal and other soft tissues: Small bilateral pleural effusions are seen. Disc levels:  T8-9: No epidural abscess is seen. There is extensive enhancement of epidural soft tissues and a disc bulge deforming the cord at T8-9. Rim enhancing fluid within the anterior aspect of the intervertebral disc space consistent with abscess measures 1.8 cm transverse by 0.9 cm AP by 0.9 cm craniocaudal. T5-6 and T6-7: Small left paracentral protrusions without central canal or foraminal stenosis. MRI LUMBAR SPINE FINDINGS Segmentation:  Standard. Alignment: Trace retrolisthesis L1 on L2 is unchanged. Otherwise maintained. Vertebrae: Heterogeneous marrow signal throughout is again seen. No fracture. Conus medullaris: Extends to the T12-L1 level and appears normal. Paraspinal and other soft tissues: Multiple bilateral renal cysts are again seen. Disc levels: L1-2: Trace retrolisthesis.  Central canal and foramina are open. L2-3:  Mild facet degenerative change.  Otherwise negative. L3-4: Mild to moderate facet arthropathy and is very small disc bulge. The central canal and foramina are open. L4-5: Advanced bilateral facet degenerative change is seen. Small epidural fluid collection subjacent to the ligamentum flavum on the right is no longer visualized. Fluid collection in the posterior paraspinous musculature on the left adjacent to the L4 spinous process has also resolved. There is marrow edema and mild enhancement in the L4 and L5 pedicles and facets, not markedly changed. A new small fluid collection off the posterior aspect of the left facet joint measures 0.6 cm transverse by 0.9 cm AP by 0.7 cm craniocaudal. Posterior epidural soft tissues at this level are edematous and enhancing. L5-S1: Facet arthropathy. Small annular fissure and shallow bulge. The central canal and foramina are open. IMPRESSION: Findings consistent with discitis and osteomyelitis with endplate destructive change about the T8-9 interspace. Extensive paraspinous inflammatory change extends into the epidural space and there is a disc bulge at  this level mildly deforming the cord.  No epidural abscess is identified. Rim enhancing fluid within the anterior aspect of the intervertebral disc space as described above is consistent with abscess. Two small fluid collections at L4-5 seen on the prior examination have resolved. However, there is a new small fluid collection posterior to the left facet joint worrisome for abscess as described above. Marrow edema and enhancement in the pedicles and facets bilaterally is most worrisome for septic facet joints but could be due to degenerative disease. The central canal is open at L4-5. Electronically Signed   By: Inge Rise M.D.   On: 10/21/2016 16:23   Mr Lumbar Spine W Wo Contrast  Result Date: 09/30/2016 CLINICAL DATA:  Back pain. Recent sepsis due to pyelonephritis and E coli bacteremia. EXAM: MRI LUMBAR SPINE WITHOUT AND WITH CONTRAST TECHNIQUE: Multiplanar and multiecho pulse sequences of the lumbar spine were obtained without and with intravenous contrast. CONTRAST:  43m MULTIHANCE GADOBENATE DIMEGLUMINE 529 MG/ML IV SOLN COMPARISON:  None. FINDINGS: Segmentation:  Normal segmentation. Alignment:  Mild retrolisthesis L1-2 Vertebrae: Negative for fracture or mass. Heterogeneous bone marrow diffusely. Possibly due to anemia. No evidence of discitis. Conus medullaris: Extends to the T12-L1 level and appears normal. Paraspinal and other soft tissues: Bilateral renal cysts. No retroperitoneal mass. Psoas muscles symmetric. There is edema in the paraspinous muscles bilaterally with enhancement suggesting myositis. Disc levels: L1-2: Mild retrolisthesis. Disc and facet degeneration. Mild spinal stenosis. L2-3:  Negative L3-4:  Mild disc and facet degeneration without stenosis L4-5: Diffuse disc bulging. Extensive facet degeneration with bony overgrowth and bilateral facet joint effusions. 5 mm posterior epidural fluid collection is present on the right side probably arising from the facet joint. There is  surrounding epidural enhancement above and below the cyst. This could represent a small abscess however could also be noninfected. In addition, there is a 8 mm fluid collection to the left of the L4 spinous process which does not enhance. This could be a small abscess as well. There is surrounding myositis. L5-S1:  Negative IMPRESSION: Heterogeneous bone marrow with overall benign appearance possibly due to anemia and prominent red marrow Severe facet degeneration at L4-5 with bilateral facet joint effusions. Right posterior epidural fluid collection 5 mm with surrounding epidural enhancement, concerning for epidural abscess versus synovial cyst. 8 mm fluid collection to the left of the L4 spinous process could also represent an abscess although it does not enhance as would be expected. There is edema and enhancement in the erector spinae muscles bilaterally suggesting myositis. Overall the findings are concerning for spinal infection. These results were called by telephone at the time of interpretation on 09/30/2016 at 6:21 pm to Dr. SOren Binet, who verbally acknowledged these results. Electronically Signed   By: CFranchot GalloM.D.   On: 09/30/2016 18:23   Dg C-arm 1-60 Min  Result Date: 10/22/2016 CLINICAL DATA:  Thoracic decompression and posterior-lateral arthrodesis. EXAM: THORACIC SPINE 2 VIEWS; DG C-ARM 61-120 MIN COMPARISON:  MRI thoracic spine January 18th. FLUOROSCOPY TIME:  62 seconds FINDINGS: Three intraoperative fluoroscopic spot views submitted, interpreting radiologist was not present at time of procedure. Initial radiograph demonstrates pedicle screws at 4 levels. Last image demonstrates two consecutive level pedicle screw fixation. IMPRESSION: Thoracic fusion. Electronically Signed   By: CElon AlasM.D.   On: 10/22/2016 21:23   Ct Renal Stone Study  Addendum Date: 10/21/2016   ADDENDUM REPORT: 10/21/2016 13:58 ADDENDUM: The original report was by Dr. WVan Clines The  following addendum is by Dr. WVan Clines These  results were called by telephone at the time of interpretation on 10/21/2016 at 1:55 pm to Dr. Tanna Furry , who verbally acknowledged these results. Electronically Signed   By: Van Clines M.D.   On: 10/21/2016 13:58   Result Date: 10/21/2016 CLINICAL DATA:  Abdominal pain and low back pain over the last 2-3 weeks. EXAM: CT ABDOMEN AND PELVIS WITHOUT CONTRAST TECHNIQUE: Multidetector CT imaging of the abdomen and pelvis was performed following the standard protocol without IV contrast. COMPARISON:  None. FINDINGS: Lower chest: Atelectasis in the right middle lobe with subsegmental atelectasis in both lower lobes. Paraspinal edema/ hematoma at the T8- 9 level, please see the musculoskeletal section below for further detail. Hepatobiliary: Small dependent densities in the gallbladder compatible with gallstones. Stable small hypodense 5 mm lesion in segment 3 of the liver. Pancreas: Unremarkable Spleen: Unremarkable Adrenals/Urinary Tract: Adrenal glands normal. Hypodense renal lesions are observed, most of fluid density but with a 5.2 by 3.7 cm mildly complex upper pole exophytic lesion with internal density 15 Hounsfield units. The other cysts are at about 2 Hounsfield units. Urinary bladder decompressed by a Foley catheter, no urinary tract stones identified. Stomach/Bowel: Appendix normal. Postoperative findings of the rectosigmoid junction. Prominent stool throughout the colon favors constipation. Several scattered descending colon and sigmoid diverticula. Vascular/Lymphatic: Mild aortoiliac atherosclerotic vascular disease. Reproductive: Unremarkable Other: No supplemental non-categorized findings. Musculoskeletal: There is an apparent fracture of the spinous process of T8 with scalloped irregular endplate destructive findings at the T8-9 level, compression fracture of T 8, posterior osseous ridging, and striking bilateral osseous foraminal stenosis at  this level. I cannot exclude discitis-osteomyelitis and on MRI of the thoracic spine with attention to this region should be considered. There is considerable paraspinal edema/hematoma. There is arthropathy of the adjacent costovertebral joints. Bridging spurring of the left sacroiliac joint. Mild spurring of both hips. Lumbar degenerative disc disease and facet arthropathy causing impingement at L3-4, L4-5, and L5-S1. IMPRESSION: 1. Complex fracture at T8-9 with endplate destruction of both levels and possible discitis -osteomyelitis, with extensive paraspinal density potentially from hematoma or phlegmon. There is both posterior osseous ridging and foraminal impingement at this level. I also suspect a fracture of the spinous process of T8. Given these findings, consider thoracic spine MRI with and without contrast for further characterization. 2. Scattered atelectasis in the lung bases. 3. Cholelithiasis. 4. Bilateral renal cysts. Mildly complex right kidney upper pole lesion is nonspecific but probably a complex cyst. 5.  Prominent stool throughout the colon favors constipation. 6. Lower lumbar impingement due to degenerative disc disease and facet arthropathy. Radiology assistant personnel have been notified to put me in telephone contact with the referring physician or the referring physician's clinical representative in order to discuss these findings. Once this communication is established I will issue an addendum to this report for documentation purposes. Electronically Signed: By: Van Clines M.D. On: 10/21/2016 13:52     Discharge Exam: Vitals:   10/27/16 0100 10/27/16 0437  BP: (!) 141/71 140/76  Pulse: (!) 101 98  Resp: 18 18  Temp: 99.7 F (37.6 C) 98.6 F (37 C)   Vitals:   10/26/16 1812 10/26/16 2100 10/27/16 0100 10/27/16 0437  BP: 139/86 138/78 (!) 141/71 140/76  Pulse: 99 92 (!) 101 98  Resp: '17 18 18 18  ' Temp: 98.8 F (37.1 C) 99.5 F (37.5 C) 99.7 F (37.6 C) 98.6 F  (37 C)  TempSrc: Oral Oral Oral Oral  SpO2: 96% 100% 99% 97%  Weight:  Height:        General: Pt is alert, follows commands appropriately, not in acute distress Cardiovascular: Regular rate and rhythm, S1/S2 +, no murmurs, no rubs, no gallops Respiratory: Clear to auscultation bilaterally, no wheezing, no crackles, no rhonchi Abdominal: Soft, non tender, non distended, bowel sounds +, no guarding  Discharge Instructions  Discharge Instructions    Ambulatory referral to Physical Medicine Rehab    Complete by:  As directed    Follow up LE weakness 2/2 thoracic abscess   Diet - low sodium heart healthy    Complete by:  As directed    Home infusion instructions [facility] May follow [place] Dosing Protocol; May administer Cathflo as needed to maintain patency of vascular access device.; Flushing of vascular access device: per [facility] Protocol: 0.9% NaCl pre/post medica...    Complete by:  As directed    Instructions:  May follow [facility] Dosing Protocol   Instructions:  May administer Cathflo as needed to maintain patency of vascular access device.   Instructions:  Flushing of vascular access device: per [facility] Protocol: 0.9% NaCl pre/post medication administration and prn patency; Heparin 100 u/ml, 55m for implanted ports and Heparin 10u/ml, 528mfor all other central venous catheters.   Instructions:  May follow [facility] Anaphylaxis Protocol for First Dose Administration in the home: 0.9% NaCl at 25-50 ml/hr to maintain IV access for protocol meds. Epinephrine 0.3 ml IV/IM PRN and Benadryl 25-50 IV/IM PRN s/s of anaphylaxis.   Instructions:  [facility] Coordinator (RN) to assist per patient IV care needs in the home PRN.   Increase activity slowly    Complete by:  As directed      Allergies as of 10/27/2016      Reactions   Metformin And Related Diarrhea   Codeine Rash   All over the body      Medication List    TAKE these medications   bisacodyl 5 MG  EC tablet Commonly known as:  DULCOLAX Take 1 tablet (5 mg total) by mouth daily as needed for moderate constipation.   cefTRIAXone IVPB Commonly known as:  ROCEPHIN Inject 2 g into the vein every 12 (twelve) hours. Indication: osteomyelitis Last Day of Therapy:  12/02/16 Labs - Once weekly:  CBC/D and BMP, Labs - Every other week:  ESR and CRP What changed:  additional instructions   docusate sodium 100 MG capsule Commonly known as:  COLACE Take 1 capsule (100 mg total) by mouth 2 (two) times daily.   HYDROcodone-acetaminophen 5-325 MG tablet Commonly known as:  NORCO/VICODIN Take 1 tablet by mouth every 6 (six) hours as needed for moderate pain.   insulin glargine 100 UNIT/ML injection Commonly known as:  LANTUS Inject 0.15 mLs (15 Units total) into the skin at bedtime.   Insulin Pen Needle 31G X 5 MM Misc Administer lantus once nightly   methocarbamol 500 MG tablet Commonly known as:  ROBAXIN Take 1 tablet (500 mg total) by mouth every 8 (eight) hours as needed for muscle spasms.   multivitamin with minerals Tabs tablet Take 1 tablet by mouth daily.   polyethylene glycol packet Commonly known as:  MIRALAX / GLYCOLAX Take 17 g by mouth 2 (two) times daily.   rivaroxaban 20 MG Tabs tablet Commonly known as:  XARELTO Take 1 tablet (20 mg total) by mouth daily with supper.   simvastatin 40 MG tablet Commonly known as:  ZOCOR TAKE 1 TABLET(40 MG) BY MOUTH EVERY EVENING  tamsulosin 0.4 MG Caps capsule Commonly known as:  FLOMAX Take 0.4 mg by mouth daily after breakfast.   vancomycin IVPB Inject 1,000 mg into the vein every 8 (eight) hours. Indication: osteomyelitis Last Day of Therapy:  12/02/16 Labs - 'Sunday/Monday:  CBC/D, BMP, and vancomycin trough. Labs - Thursday:  BMP and vancomycin trough Labs - Every other week:  ESR and CRP   zolpidem 5 MG tablet Commonly known as:  AMBIEN Take 1 tablet (5 mg total) by mouth at bedtime as needed for sleep.             Home Infusion Instuctions        Start     Ordered   10/27/16 0000  Home infusion instructions [facility] May follow [facility] Dosing Protocol; May administer Cathflo as needed to maintain patency of vascular access device.; Flushing of vascular access device: per [facility] Protocol: 0.9% NaCl pre/post medica...    Question Answer Comment  Instructions May follow [facility] Dosing Protocol   Instructions May administer Cathflo as needed to maintain patency of vascular access device.   Instructions Flushing of vascular access device: per [facility] Protocol: 0.9% NaCl pre/post medication administration and prn patency; Heparin 100 u/ml, 5ml for implanted ports and Heparin 10u/ml, 5ml for all other central venous catheters.   Instructions May follow [facility] Anaphylaxis Protocol for First Dose Administration in the home: 0.9% NaCl at 25-50 ml/hr to maintain IV access for protocol meds. Epinephrine 0.3 ml IV/IM PRN and Benadryl 25-50 IV/IM PRN s/s of anaphylaxis.   Instructions [facility] Infusion Coordinator (RN) to assist per patient IV care needs in the home PRN.      01' /24/18 1017     Follow-up Information    Scarlette Calico, MD Follow up.   Specialty:  Internal Medicine Contact information: [address] [place] [address] [phone]        CABBELL,KYLE L, MD. Schedule an appointment as soon as possible for a visit.   Specialty:  Neurosurgery Contact information: [address] [place] [address] [phone]            The results of significant diagnostics from this hospitalization (including imaging, microbiology, ancillary and laboratory) are listed below for reference.     Microbiology: Recent Results (from the past 240 hour(s))  Blood culture (routine x 2)     Status: None   Collection Time: 10/21/16  6:44 PM  Result Value Ref Range Status   Specimen Description BLOOD RIGHT HAND  Final   Special Requests BOTTLES DRAWN  AEROBIC AND ANAEROBIC 5CC  Final   Culture NO GROWTH 5 DAYS  Final   Report Status 10/26/2016 FINAL  Final  Blood culture (routine x 2)     Status: None   Collection Time: 10/21/16  6:49 PM  Result Value Ref Range Status   Specimen Description BLOOD LEFT ANTECUBITAL  Final   Special Requests BOTTLES DRAWN AEROBIC ONLY 10CC  Final   Culture NO GROWTH 5 DAYS  Final   Report Status 10/26/2016 FINAL  Final  Surgical pcr screen     Status: None   Collection Time: 10/22/16  6:57 AM  Result Value Ref Range Status   MRSA, PCR NEGATIVE NEGATIVE Final   Staphylococcus aureus NEGATIVE NEGATIVE Final    Comment:        The Xpert SA Assay (FDA approved for NASAL specimens in patients over 49 years of age), is one component of  a comprehensive surveillance program.  Test performance has been validated by [facility] for patients greater than or equal to 70 year old. It is not intended to diagnose infection nor to guide or monitor treatment.   Fungus Culture With Stain     Status: None (Preliminary result)   Collection Time: 10/22/16  8:15 PM  Result Value Ref Range Status   Fungus Stain Final report  Final    Comment: (NOTE) Performed At: [facility] [place], [place] [id number] Lindon Romp MD [id number]    Fungus (Mycology) Culture PENDING  Incomplete   Fungal Source TISSUE  Final    Comment: EPIDURAL  Fungus Culture With Stain     Status: None (Preliminary result)   Collection Time: 10/22/16  8:15 PM  Result Value Ref Range Status   Fungus Stain Final report  Final    Comment: (NOTE) Performed At: [facility] [place], [place] [id number] Lindon Romp MD [id number]    Fungus (Mycology) Culture PENDING  Incomplete   Fungal Source TISSUE  Final    Comment: DURAL MEMBRANE  Fungus Culture With Stain     Status: None (Preliminary result)   Collection Time: 10/22/16  8:15 PM  Result Value Ref Range Status   Fungus Stain  Final report  Final    Comment: (NOTE) Performed At: [facility] [place], [place] [id number] Lindon Romp MD [id number]    Fungus (Mycology) Culture PENDING  Incomplete   Fungal Source TISSUE  Final    Comment: T8 9 DISK  Aerobic/Anaerobic Culture (surgical/deep wound)     Status: None (Preliminary result)   Collection Time: 10/22/16  8:15 PM  Result Value Ref Range Status   Specimen Description TISSUE  Final   Special Requests EPIDURAL SPEC A  Final   Gram Stain   Final    FEW WBC PRESENT,BOTH PMN AND MONONUCLEAR NO ORGANISMS SEEN    Culture   Final    NO GROWTH 4 DAYS NO ANAEROBES ISOLATED; CULTURE IN PROGRESS FOR 5 DAYS   Report Status PENDING  Incomplete  Aerobic/Anaerobic Culture (surgical/deep wound)     Status: None (Preliminary result)   Collection Time: 10/22/16  8:15 PM  Result Value Ref Range Status   Specimen Description TISSUE  Final   Special Requests DURAL MEMBRANE SPEC B  Final   Gram Stain   Final    RARE WBC PRESENT,BOTH PMN AND MONONUCLEAR NO ORGANISMS SEEN    Culture   Final    NO GROWTH 4 DAYS NO ANAEROBES ISOLATED; CULTURE IN PROGRESS FOR 5 DAYS   Report Status PENDING  Incomplete  Aerobic/Anaerobic Culture (surgical/deep wound)     Status: None (Preliminary result)   Collection Time: 10/22/16  8:15 PM  Result Value Ref Range Status   Specimen Description TISSUE  Final   Special Requests T8 9 DISK SPEC C  Final   Gram Stain   Final    RARE WBC PRESENT,BOTH PMN AND MONONUCLEAR NO ORGANISMS SEEN    Culture   Final    NO GROWTH 4 DAYS NO ANAEROBES ISOLATED; CULTURE IN PROGRESS FOR 5 DAYS   Report Status PENDING  Incomplete  Fungus Culture Result     Status: None   Collection Time: 10/22/16  8:15 PM  Result Value Ref Range Status   Result 1 Comment  Final    Comment: (NOTE) KOH/Calcofluor preparation:  no fungus observed. Performed At: [facility] [address]  [address] [place], [place] [id number] Lindon Romp  MD [id number]   Fungus Culture Result     Status: None   Collection Time: 10/22/16  8:15 PM  Result Value Ref Range Status   Result 1 Comment  Final    Comment: (NOTE) KOH/Calcofluor preparation:  no fungus observed. Performed At: [facility] [place], [place] [id number] Lindon Romp MD [id number]   Fungus Culture Result     Status: None   Collection Time: 10/22/16  8:15 PM  Result Value Ref Range Status   Result 1 Comment  Final    Comment: (NOTE) KOH/Calcofluor preparation:  no fungus observed. Performed At: [facility] [place], [place] [id number] Lindon Romp MD [id number]      Labs: Basic Metabolic Panel:  Recent Labs Lab 10/23/16 0305 10/24/16 0629 10/25/16 0216 10/26/16 0733 10/27/16 0315  NA 137 135 136 138 140  K 3.9 3.6 3.9 3.5 3.5  CL 102 101 103 104 102  CO2 '26 27 25 25 27  ' GLUCOSE 209* 163* 160* 104* 153*  BUN '8 12 12 10 10  ' CREATININE 0.67 0.70 0.72 0.68 0.63  CALCIUM 8.9 8.4* 8.4* 8.6* 8.8*  MG  --   --  1.7  --   --    Liver Function Tests:  Recent Labs Lab 10/21/16 1258  AST 22  ALT 16*  ALKPHOS 56  BILITOT 0.3  PROT 7.4  ALBUMIN 2.5*    Recent Labs Lab 10/21/16 1258  LIPASE 15   CBC:  Recent Labs Lab 10/21/16 1258  10/23/16 0305 10/24/16 0629 10/25/16 0216 10/26/16 0733 10/27/16 0315  WBC 6.5  < > 6.9 12.1* 12.2* 9.5 8.0  NEUTROABS 4.6  --   --   --   --   --   --   HGB 10.0*  < > 10.3* 8.8* 8.9* 8.5* 8.6*  HCT 32.1*  < > 32.9* 28.1* 28.1* 27.1* 26.8*  MCV 88.4  < > 87.0 86.5 86.7 86.3 85.4  PLT 236  < > 225 214 210 249 272  < > = values in this interval not displayed.  CBG:  Recent Labs Lab 10/26/16 0652 10/26/16 1121 10/26/16 1642 10/26/16 2144 10/27/16 0630  GLUCAP 110* 202* 204* 152* 144*    SIGNED: Time coordinating discharge: 30 minutes  MAGICK-Jeana Kersting, MD  [facility] 10/27/2016, 10:17 AM Pager [phone]  If 7PM-7AM,  please contact night-coverage [email or web address] Password [email or web address]

## 2016-10-27 NOTE — Clinical Social Work Note (Signed)
Received authorization from [place] from [place], [place]. Josem Kaufmann [id number], eff. 10/27/16 and good for 3 days. Additional clinicals need to be submitted by facility to [facility] - fax [phone]. This information provided to admissions director at [facility]  Patient will discharge via ambulance. Visited with patient, his brother Mohammad Oros and sister-in-law Tamela Oddi and informed them that authorization received and ambulance transport.  Marvia Troost Givens, MSW, LCSW Licensed Clinical Social Worker [place] [phone]

## 2016-10-27 NOTE — Progress Notes (Signed)
Occupational Therapy Treatment Patient Details Name: Charles Parker MRN: [id number] DOB: 1946-01-29 Today's Date: 10/27/2016    History of present illness 71 yo male s/p T8, T9 discitis with osteomyelitis and epidural abscess. L4-5 - small abscess noted in new area though other areas resolved which is the site of the previously identified epidural abscess. T6-11 decompression PLA and fixation with I &D. recent admission 12/24 due to pyelonephritis with sepsis and L4, L5 epidural abscess with osteomyelitis.   OT comments  Pt progressing toward OT goals. Session focused on UB bathing seated without back support. Pt able to complete with mod assist to maintain balance and mod assist for bathing tasks. He remains highly motivated to participate with therapy to improve independence with ADL. Pt would benefit from SNF level rehabilitation prior to D/C home in order to maximize PLOF.    Follow Up Recommendations  Supervision/Assistance - 24 hour;SNF    Equipment Recommendations   (TBD)    Recommendations for Other Services      Precautions / Restrictions Precautions Precautions: Fall;Back Precaution Comments: Reviewed verbally Required Braces or Orthoses: Spinal Brace Spinal Brace: Thoracolumbosacral orthotic;Applied in sitting position Restrictions Weight Bearing Restrictions: No       Mobility Bed Mobility Overal bed mobility: Needs Assistance Bed Mobility: Sidelying to Sit Rolling: Min assist       Sit to sidelying: Mod assist General bed mobility comments:  oob in chair on OT arrival.  Transfers Overall transfer level: Needs assistance   Transfers: Sit to/from Stand;Stand Pivot Transfers Sit to Stand: Max assist;+2 safety/equipment (with lift) Stand pivot transfers: Max assist;+2 safety/equipment (with lift)       General transfer comment: able to push through LE's to assist with sit to stand with sara plus.  standing during transfer to chair with lift; performed x 4  during session with three standing pivots and three standing trials (to chair, to [facility], back to chair)    Balance Overall balance assessment: Needs assistance Sitting-balance support: Bilateral upper extremity supported Sitting balance-Leahy Scale: Poor Sitting balance - Comments: Up to mod assist without UE support   Standing balance support: Bilateral upper extremity supported Standing balance-Leahy Scale: Zero Standing balance comment: standing three sessions in [place] plus with cues for intermittent hip and knee extension and hold x 5 sec, first trial 2 min 20 sec, second 2 min, third 1 minute                   ADL Overall ADL's : Needs assistance/impaired     Grooming: Set up;Sitting   Upper Body Bathing: Moderate assistance;Sitting       Upper Body Dressing : Moderate assistance;Sitting Upper Body Dressing Details (indicate cue type and reason): pt requires up to mod assist to maintain sitting balance without back support.                   General ADL Comments: Session focused on UB dressing tasks seated without back support. Pt tolerated for ~10 minutes. Decreased assist required this session for UB bathing and dressing tasks.      Vision                     Perception     Praxis      Cognition   Behavior During Therapy: [facility] for tasks assessed/performed Overall Cognitive Status: Within Functional Limits for tasks assessed  Extremity/Trunk Assessment               Exercises     Shoulder Instructions       General Comments      Pertinent Vitals/ Pain       Pain Assessment: 0-10 Pain Score: 8  Faces Pain Scale: Hurts even more Pain Location: upper back Pain Descriptors / Indicators: Discomfort;Sore Pain Intervention(s): Limited activity within patient's tolerance;Monitored during session;Repositioned  Home Living                                          Prior  Functioning/Environment              Frequency  Min 3X/week        Progress Toward Goals  OT Goals(current goals can now be found in the care plan section)  Progress towards OT goals: Progressing toward goals  Acute Rehab OT Goals Patient Stated Goal: rehab before home OT Goal Formulation: With patient Time For Goal Achievement: 11/06/16 Potential to Achieve Goals: Good ADL Goals Pt Will Perform Upper Body Bathing: with min assist;sitting Additional ADL Goal #1: Pt will static sit EOB supervision level as precursor to adls. for 10 minutes with stable vitals Additional ADL Goal #2: Pt will complete basic transfer with [place] total +2 (A) as precursor to adls.   Plan Discharge plan needs to be updated    Co-evaluation                 End of Session Equipment Utilized During Treatment: Back brace   Activity Tolerance Patient tolerated treatment well   Patient Left in chair;with call bell/phone within reach;with family/visitor present;with chair alarm set   Nurse Communication          Time: [id number] OT Time Calculation (min): 30 min  Charges: OT General Charges $OT Visit: 1 Procedure OT Treatments $Self Care/Home Management : 23-37 mins  Norman Herrlich, OTR/L [id number] 10/27/2016, 12:47 PM

## 2016-10-27 NOTE — Progress Notes (Signed)
Physical Therapy Treatment Patient Details Name: Charles Parker MRN: [id number] DOB: 1946-08-18 Today's Date: 10/27/2016    History of Present Illness 71 yo male s/p T8, T9 discitis with osteomyelitis and epidural abscess. L4-5 - small abscess noted in new area though other areas resolved which is the site of the previously identified epidural abscess. T6-11 decompression PLA and fixation with I &D. recent admission 12/24 due to pyelonephritis with sepsis and L4, L5 epidural abscess with osteomyelitis.    PT Comments    Patient progressing with standing tolerance and able to work safely on hip and knee extension in standing for strengthening core for balance and strength.  Feel continued skilled PT needed at SNF level prior to d/c home.  Follow Up Recommendations  SNF     Equipment Recommendations  Other (comment) (TBA at next venue)    Recommendations for Other Services       Precautions / Restrictions Precautions Precautions: Fall;Back Required Braces or Orthoses: Spinal Brace Spinal Brace: Thoracolumbosacral orthotic;Applied in sitting position Restrictions Weight Bearing Restrictions: No    Mobility  Bed Mobility Overal bed mobility: Needs Assistance Bed Mobility: Sidelying to Sit Rolling: Min assist       Sit to sidelying: Mod assist General bed mobility comments: assist for legs off bed and cues and stabilizing for trunk elevation  Transfers Overall transfer level: Needs assistance   Transfers: Sit to/from Stand;Stand Pivot Transfers Sit to Stand: Max assist;+2 safety/equipment (with lift) Stand pivot transfers: Max assist;+2 safety/equipment (with lift)       General transfer comment: able to push through LE's to assist with sit to stand with sara plus.  standing during transfer to chair with lift; performed x 4 during session with three standing pivots and three standing trials (to chair, to [facility], back to chair)  Ambulation/Gait                  Stairs            Wheelchair Mobility    Modified Rankin (Stroke Patients Only)       Balance Overall balance assessment: Needs assistance Sitting-balance support: Bilateral upper extremity supported Sitting balance-Leahy Scale: Poor Sitting balance - Comments: minguard for safety with bilat UE support,    Standing balance support: Bilateral upper extremity supported Standing balance-Leahy Scale: Zero Standing balance comment: standing three sessions in [place] plus with cues for intermittent hip and knee extension and hold x 5 sec, first trial 2 min 20 sec, second 2 min, third 1 minute                    Cognition Arousal/Alertness: Awake/alert Behavior During Therapy: WFL for tasks assessed/performed Overall Cognitive Status: Within Functional Limits for tasks assessed                      Exercises      General Comments        Pertinent Vitals/Pain Faces Pain Scale: Hurts even more Pain Location: upper back Pain Descriptors / Indicators: Discomfort;Sore Pain Intervention(s): RN gave pain meds during session;Monitored during session;Repositioned    Home Living                      Prior Function            PT Goals (current goals can now be found in the care plan section) Progress towards PT goals: Progressing toward goals    Frequency    Min  5X/week      PT Plan Current plan remains appropriate    Co-evaluation             End of Session Equipment Utilized During Treatment: Back brace Activity Tolerance: Patient tolerated treatment well Patient left: in chair;with chair alarm set     Time: 0900-0940 (&1010-1020) PT Time Calculation (min) (ACUTE ONLY): 40 min  Charges:  $Therapeutic Activity: 38-52 mins                    G CodesReginia Naas November 05, 2016, 11:12 AM  Magda Kiel, [place] November 05, 2016

## 2016-10-28 ENCOUNTER — Encounter: Payer: Self-pay | Admitting: Internal Medicine

## 2016-10-28 ENCOUNTER — Non-Acute Institutional Stay (SKILLED_NURSING_FACILITY): Payer: Medicare HMO | Admitting: Internal Medicine

## 2016-10-28 DIAGNOSIS — D62 Acute posthemorrhagic anemia: Secondary | ICD-10-CM | POA: Diagnosis not present

## 2016-10-28 DIAGNOSIS — R29898 Other symptoms and signs involving the musculoskeletal system: Secondary | ICD-10-CM | POA: Diagnosis not present

## 2016-10-28 DIAGNOSIS — M4644 Discitis, unspecified, thoracic region: Secondary | ICD-10-CM | POA: Diagnosis not present

## 2016-10-28 DIAGNOSIS — K5909 Other constipation: Secondary | ICD-10-CM | POA: Diagnosis not present

## 2016-10-28 DIAGNOSIS — Z794 Long term (current) use of insulin: Secondary | ICD-10-CM

## 2016-10-28 DIAGNOSIS — M546 Pain in thoracic spine: Secondary | ICD-10-CM | POA: Diagnosis not present

## 2016-10-28 DIAGNOSIS — I824Y9 Acute embolism and thrombosis of unspecified deep veins of unspecified proximal lower extremity: Secondary | ICD-10-CM

## 2016-10-28 DIAGNOSIS — E119 Type 2 diabetes mellitus without complications: Secondary | ICD-10-CM

## 2016-10-28 DIAGNOSIS — IMO0001 Reserved for inherently not codable concepts without codable children: Secondary | ICD-10-CM

## 2016-10-28 NOTE — Progress Notes (Signed)
LOCATION: [facility]  PCP: Scarlette Calico, MD   Code Status: Full Code  Goals of care: Advanced Directive information Advanced Directives 10/22/2016  Does Patient Have a Medical Advance Directive? Yes  Type of Advance Directive Living will  Does patient want to make changes to medical advance directive? Yes (Inpatient - patient defers changing a medical advance directive at this time)  Copy of [place] in Chart? -  Would patient like information on creating a medical advance directive? -  Pre-existing out of facility DNR order (yellow form or pink MOST form) -       Extended Emergency Contact Information Primary Emergency Contact: Poblano,Anthony Address: [address]          [place], [place] [address] [place] Phone: [phone] Mobile Phone: [phone] Relation: Brother Secondary Emergency Contact: Hazeline Junk[place] Mobile Phone: [phone] Relation: Brother   Allergies  Allergen Reactions  . Metformin And Related Diarrhea  . Codeine Rash    All over the body    Chief Complaint  Patient presents with  . Readmit To SNF    Readmission Visit      HPI:  Patient is a 71 y.o. male seen today for short term rehabilitation post hospital re-admission from 10/21/2016-10/27/2016 with worsening weakness of both his lower legs. He was noted to have discitis and osteomyelitis with new fluid collection concerning for another epidural abscess. He underwent thoracic decompression, discectomy, lateral arthrodesis and fixation.He was seen by infectious disease team and continued on IV antibiotics.Of note, he was here for short term rehabilitation post hospital admission from 09/26/2016-10/05/2016 with sepsis from Escherichia coli pyelonephritis, L4-L5 epidural abscess with osteomyelitis and was on IV antibiotic. He has medical history of DVT, BPH, diabetes mellitus on insulin, dyslipidemia among others. He is seen in  his room today with his brother at bedside.   Review of Systems:  Constitutional: Negative for fever, chills, diaphoresis.  HENT: Negative for headache, congestion, nasal discharge, sore throat, difficulty swallowing.   Eyes: Negative for eye pain, blurred vision, double vision and discharge.  Respiratory: Negative for  wheezing. Positive for cough with occasional phlegm and shortness of breath with exertion.  Cardiovascular: Negative for chest pain,leg swelling. Positive for occasional palpitations.   Gastrointestinal: Negative for heartburn, nausea, vomiting, abdominal pain, loss of appetite. Last bowel movement was this morning.  Genitourinary: Negative for flank pain.  Musculoskeletal: Negative for fall. Positive for mid back pain.  pain medication has been helpful. Skin: Negative for itching, rash.  Neurological: Negative for dizziness. Positive for weakness to both legs and occasional tingling in both legs. Psychiatric/Behavioral: Negative for depression   Past Medical History:  Diagnosis Date  . Clotting disorder (HCC)    DVT  . Diabetes mellitus    type II  . Diverticulosis   . DVT (deep venous thrombosis) ([place])   . Glaucoma    no eye drops   . Hepatic cyst   . Hypercholesterolemia   . Hypertension   . Myocardial infarction    very mild long ago   . Renal cyst   . Sleep apnea   . Tubulovillous adenoma    Past Surgical History:  Procedure Laterality Date  . BLADDER NECK RECONSTRUCTION  02/03/2012   Procedure: BLADDER NECK REPAIR;  Surgeon: Adin Hector, MD;  Location: [facility];  Service: General;;  . COLONOSCOPY  02/2013   polyp removal(mult.)  . KNEE SURGERY  2004   left  .  POLYPECTOMY    . PROCTOSCOPY  02/03/2012   Procedure: PROCTOSCOPY;  Surgeon: Adin Hector, MD;  Location: [facility];  Service: General;;  . STOMACH SURGERY  02/2012  . TEE WITHOUT CARDIOVERSION N/A 10/05/2016   Procedure: TRANSESOPHAGEAL ECHOCARDIOGRAM (TEE);  Surgeon: Sanda Klein, MD;   Location: [facility];  Service: Cardiovascular;  Laterality: N/A;   Social History:   reports that he has never smoked. He has never used smokeless tobacco. He reports that he drinks alcohol. He reports that he does not use drugs.  Family History  Problem Relation Age of Onset  . Hypertension Mother   . Diabetes Mother   . Cancer Mother     ? location  . Colon cancer Father 55  . Colon polyps Neg Hx     Medications: Allergies as of 10/28/2016      Reactions   Metformin And Related Diarrhea   Codeine Rash   All over the body      Medication List       Accurate as of 10/28/16 12:46 PM. Always use your most recent med list.          bisacodyl 5 MG EC tablet Commonly known as:  DULCOLAX Take 1 tablet (5 mg total) by mouth daily as needed for moderate constipation.   cefTRIAXone IVPB Commonly known as:  ROCEPHIN Inject 2 g into the vein every 12 (twelve) hours. Indication: osteomyelitis Last Day of Therapy:  12/02/16 Labs - Once weekly:  CBC/D and BMP, Labs - Every other week:  ESR and CRP   docusate sodium 100 MG capsule Commonly known as:  COLACE Take 1 capsule (100 mg total) by mouth 2 (two) times daily.   HYDROcodone-acetaminophen 5-325 MG tablet Commonly known as:  NORCO/VICODIN Take 1 tablet by mouth every 6 (six) hours as needed for moderate pain.   insulin glargine 100 UNIT/ML injection Commonly known as:  LANTUS Inject 25 Units into the skin at bedtime.   Insulin Pen Needle 31G X 5 MM Misc Administer lantus once nightly   methocarbamol 500 MG tablet Commonly known as:  ROBAXIN Take 1 tablet (500 mg total) by mouth every 8 (eight) hours as needed for muscle spasms.   multivitamin with minerals Tabs tablet Take 1 tablet by mouth daily.   polyethylene glycol packet Commonly known as:  MIRALAX / GLYCOLAX Take 17 g by mouth 2 (two) times daily.   rivaroxaban 20 MG Tabs tablet Commonly known as:  XARELTO Take 1 tablet (20 mg total) by mouth daily with  supper.   simvastatin 40 MG tablet Commonly known as:  ZOCOR TAKE 1 TABLET(40 MG) BY MOUTH EVERY EVENING   tamsulosin 0.4 MG Caps capsule Commonly known as:  FLOMAX Take 0.4 mg by mouth daily after breakfast.   vancomycin IVPB Inject 1,000 mg into the vein every 8 (eight) hours. Indication: osteomyelitis Last Day of Therapy:  12/02/16 Labs - Sunday/Monday:  CBC/D, BMP, and vancomycin trough. Labs - Thursday:  BMP and vancomycin trough Labs - Every other week:  ESR and CRP   zolpidem 5 MG tablet Commonly known as:  AMBIEN Take 1 tablet (5 mg total) by mouth at bedtime as needed for sleep.       Immunizations: Immunization History  Administered Date(s) Administered  . Influenza,inj,Quad PF,36+ Mos 06/20/2013, 07/10/2014  . Influenza-Unspecified 06/05/2015, 07/26/2016  . Pneumococcal Conjugate-13 06/20/2013, 07/23/2014  . Pneumococcal Polysaccharide-23 06/19/2015  . Tdap 06/20/2013     Physical Exam:  Vitals:   10/28/16 1239  BP: (!) 144/95  Pulse: 98  Resp: 18  Temp: 99.3 F (37.4 C)  TempSrc: Oral  SpO2: 96%  Weight: 174 lb (78.9 kg)  Height: 6' (1.829 m)   Body mass index is 23.6 kg/m.  General- elderly male, well built, in no acute distress Head- normocephalic, atraumatic Nose- no nasal discharge Throat- moist mucus membrane  Eyes- PERRLA, EOMI, no pallor, no icterus, no discharge, normal conjunctiva, normal sclera Neck- no cervical lymphadenopathy Cardiovascular- normal s1,s2, no murmur Respiratory- bilateral clear to auscultation, no wheeze, no rhonchi, no crackles, no use of accessory muscles Abdomen- bowel sounds present, soft, non tender, no guarding or rigidity, no CVA tenderness, condom catheter present Musculoskeletal- able to move all 4 extremities, generalized weakness more prominent to his lower extremity with strength 2-3 out of 5 in lower extremities, trace leg edema  Neurological- alert and oriented to person, place and time Skin- warm and dry,  right arm PICC line Psychiatry- normal mood and affect    Labs reviewed: Basic Metabolic Panel:  Recent Labs  08/30/16 1609  09/28/16 0516  10/25/16 0216 10/26/16 0733 10/27/16 0315  NA 130*  < > 138  < > 136 138 140  K 3.9  < > 4.2  < > 3.9 3.5 3.5  CL 94*  < > 108  < > 103 104 102  CO2 23  < > 24  < > _0 GLUCOSE 320*  < > 98  < > 160* 104* 153*  BUN 13  < > 10  < > _1 CREATININE 0.96  < > 0.81  < > 0.72 0.68 0.63  CALCIUM 8.8  9.1  < > 8.7*  < > 8.4* 8.6* 8.8*  MG 1.9  --  1.6*  --  1.7  --   --   PHOS 4.2  --   --   --   --   --   --   < > = values in this interval not displayed. Liver Function Tests:  Recent Labs  09/26/16 1835 09/28/16 0516 10/11/16 10/21/16 1258  AST 32 _2 ALT 21 15* 21 16*  ALKPHOS 74 58 71 56  BILITOT 0.8 0.2*  --  0.3  PROT 8.5* 6.8  --  7.4  ALBUMIN 2.9* 2.3*  --  2.5*    Recent Labs  10/21/16 1258  LIPASE 15   No results for input(s): AMMONIA in the last 8760 hours. CBC:  Recent Labs  09/28/16 0516  10/07/16 0734  10/21/16 1258  10/25/16 0216 10/26/16 0733 10/27/16 0315  WBC 9.7  < > 7.0  < > 6.5  < > 12.2* 9.5 8.0  NEUTROABS 7.1  --  4.9  --  4.6  --   --   --   --   HGB 10.1*  < > 10.5*  < > 10.0*  < > 8.9* 8.5* 8.6*  HCT 31.6*  < > 33.7*  < > 32.1*  < > 28.1* 27.1* 26.8*  MCV 88.8  < > 89.6  --  88.4  < > 86.7 86.3 85.4  PLT 240  < > 327  < > 236  < > 210 249 272  < > = values in this interval not displayed. Cardiac Enzymes:  Recent Labs  09/28/16 2108 09/29/16 0529 10/07/16 0734  TROPONINI 0.04* <0.03 <0.03   BNP: Invalid input(s): POCBNP CBG:  Recent Labs  10/27/16 0630 10/27/16 1133 10/27/16 1636  GLUCAP 144*  178* 215*    Radiological Exams: Dg Thoracic Spine 2 View  Result Date: 10/22/2016 CLINICAL DATA:  Thoracic decompression and posterior-lateral arthrodesis. EXAM: THORACIC SPINE 2 VIEWS; DG C-ARM 61-120 MIN COMPARISON:  MRI thoracic spine January 18th. FLUOROSCOPY TIME:   62 seconds FINDINGS: Three intraoperative fluoroscopic spot views submitted, interpreting radiologist was not present at time of procedure. Initial radiograph demonstrates pedicle screws at 4 levels. Last image demonstrates two consecutive level pedicle screw fixation. IMPRESSION: Thoracic fusion. Electronically Signed   By: Elon Alas M.D.   On: 10/22/2016 21:23   Dg Thoracic Spine 2 View  Result Date: 10/07/2016 CLINICAL DATA:  [place] this morning, having mid/lower back pain, upper back/neck pain EXAM: THORACIC SPINE 2 VIEWS COMPARISON:  Chest radiographs 09/26/2016 FINDINGS: Twelve pairs of ribs. Bones demineralized. Compression fracture mid thoracic spine with 50% height loss of T8 vertebral body, unchanged. Remaining vertebral body heights maintained. Endplate spur formation lower thoracic spine. No definite acute fracture, subluxation, or bone destruction. IMPRESSION: Chronic compression fracture T8 vertebral body. No acute abnormalities. Electronically Signed   By: Lavonia Dana M.D.   On: 10/07/2016 08:09   Dg Lumbar Spine Complete  Result Date: 10/07/2016 CLINICAL DATA:  [place] this morning, having mid/lower back pain, upper back/neck pain EXAM: LUMBAR SPINE - COMPLETE 4+ VIEW COMPARISON:  MRI lumbar spine 09/30/2016 FINDINGS: Diffuse osseous demineralization. Five non-rib-bearing lumbar vertebra. Scattered mild degenerative disc disease changes with small endplate spurs at several levels. Vertebral body heights maintained without fracture or subluxation. No bone destruction or spondylolysis. SI joints preserved. IMPRESSION: Mild degenerative disc disease changes lumbar spine. No acute abnormalities. Electronically Signed   By: Lavonia Dana M.D.   On: 10/07/2016 08:07   Ct Head Wo Contrast  Result Date: 10/07/2016 CLINICAL DATA:  Recent fall with head injury and neck pain, initial encounter EXAM: CT HEAD WITHOUT CONTRAST CT CERVICAL SPINE WITHOUT CONTRAST TECHNIQUE: Multidetector CT imaging of the  head and cervical spine was performed following the standard protocol without intravenous contrast. Multiplanar CT image reconstructions of the cervical spine were also generated. COMPARISON:  None. FINDINGS: CT HEAD FINDINGS Brain: Mild atrophic changes are noted. A cavum septum pellucidum is seen. No findings to suggest acute hemorrhage, acute infarction or space-occupying mass lesion are noted. Vascular: No hyperdense vessel or unexpected calcification. Skull: Normal. Negative for fracture or focal lesion. Sinuses/Orbits: No acute finding. Other: None. CT CERVICAL SPINE FINDINGS Alignment: Vertebral body alignment is within normal limits. Skull base and vertebrae: 7 cervical segments are well visualized. Osteophytic changes are noted at all levels. Mild facet hypertrophic changes are seen as well. No fracture or acute facet abnormality is noted. Soft tissues and spinal canal: Within normal limits. Disc levels: Disc space narrowing is noted worst at C3-4 and C5-6. As described above multilevel osteophytic changes are noted. Upper chest: Within normal limits.  Right-sided PICC line is seen. Other: None IMPRESSION: CT of the head: Atrophic changes are noted without acute intracranial abnormality. CT of cervical spine: Degenerative change without acute abnormality. Electronically Signed   By: Inez Catalina M.D.   On: 10/07/2016 08:36   Ct Cervical Spine Wo Contrast  Result Date: 10/07/2016 CLINICAL DATA:  Recent fall with head injury and neck pain, initial encounter EXAM: CT HEAD WITHOUT CONTRAST CT CERVICAL SPINE WITHOUT CONTRAST TECHNIQUE: Multidetector CT imaging of the head and cervical spine was performed following the standard protocol without intravenous contrast. Multiplanar CT image reconstructions of the cervical spine were also generated. COMPARISON:  None. FINDINGS:  CT HEAD FINDINGS Brain: Mild atrophic changes are noted. A cavum septum pellucidum is seen. No findings to suggest acute hemorrhage, acute  infarction or space-occupying mass lesion are noted. Vascular: No hyperdense vessel or unexpected calcification. Skull: Normal. Negative for fracture or focal lesion. Sinuses/Orbits: No acute finding. Other: None. CT CERVICAL SPINE FINDINGS Alignment: Vertebral body alignment is within normal limits. Skull base and vertebrae: 7 cervical segments are well visualized. Osteophytic changes are noted at all levels. Mild facet hypertrophic changes are seen as well. No fracture or acute facet abnormality is noted. Soft tissues and spinal canal: Within normal limits. Disc levels: Disc space narrowing is noted worst at C3-4 and C5-6. As described above multilevel osteophytic changes are noted. Upper chest: Within normal limits.  Right-sided PICC line is seen. Other: None IMPRESSION: CT of the head: Atrophic changes are noted without acute intracranial abnormality. CT of cervical spine: Degenerative change without acute abnormality. Electronically Signed   By: Inez Catalina M.D.   On: 10/07/2016 08:36   Mr Thoracic Spine W Wo Contrast  Result Date: 10/21/2016 CLINICAL DATA:  Diabetic patient with a recent history of discitis and epidural abscess. Increasing left worse than right lower extremity weakness. EXAM: MRI THORACIC AND LUMBAR SPINE WITHOUT AND WITH CONTRAST TECHNIQUE: Multiplanar and multiecho pulse sequences of the thoracic and lumbar spine were obtained without and with intravenous contrast. CONTRAST:  17 ml MULTIHANCE GADOBENATE DIMEGLUMINE 529 MG/ML IV SOLN COMPARISON:  MRI lumbar spine 09/30/2016. CT abdomen and pelvis this same day. FINDINGS: MRI THORACIC SPINE FINDINGS Alignment:  Maintained. Vertebrae: Endplate destructive change is seen about the T8-9 level with marked marrow edema and enhancement throughout both vertebral bodies consistent with discitis and osteomyelitis. Edema and enhancement are also present of the T8 and T9 facets and pedicles. Extensive paraspinous inflammatory change is present about  the T8 and T9 vertebral bodies. Cord:  Demonstrates normal signal. Paraspinal and other soft tissues: Small bilateral pleural effusions are seen. Disc levels: T8-9: No epidural abscess is seen. There is extensive enhancement of epidural soft tissues and a disc bulge deforming the cord at T8-9. Rim enhancing fluid within the anterior aspect of the intervertebral disc space consistent with abscess measures 1.8 cm transverse by 0.9 cm AP by 0.9 cm craniocaudal. T5-6 and T6-7: Small left paracentral protrusions without central canal or foraminal stenosis. MRI LUMBAR SPINE FINDINGS Segmentation:  Standard. Alignment: Trace retrolisthesis L1 on L2 is unchanged. Otherwise maintained. Vertebrae: Heterogeneous marrow signal throughout is again seen. No fracture. Conus medullaris: Extends to the T12-L1 level and appears normal. Paraspinal and other soft tissues: Multiple bilateral renal cysts are again seen. Disc levels: L1-2: Trace retrolisthesis.  Central canal and foramina are open. L2-3:  Mild facet degenerative change.  Otherwise negative. L3-4: Mild to moderate facet arthropathy and is very small disc bulge. The central canal and foramina are open. L4-5: Advanced bilateral facet degenerative change is seen. Small epidural fluid collection subjacent to the ligamentum flavum on the right is no longer visualized. Fluid collection in the posterior paraspinous musculature on the left adjacent to the L4 spinous process has also resolved. There is marrow edema and mild enhancement in the L4 and L5 pedicles and facets, not markedly changed. A new small fluid collection off the posterior aspect of the left facet joint measures 0.6 cm transverse by 0.9 cm AP by 0.7 cm craniocaudal. Posterior epidural soft tissues at this level are edematous and enhancing. L5-S1: Facet arthropathy. Small annular fissure and shallow bulge. The central  canal and foramina are open. IMPRESSION: Findings consistent with discitis and osteomyelitis with  endplate destructive change about the T8-9 interspace. Extensive paraspinous inflammatory change extends into the epidural space and there is a disc bulge at this level mildly deforming the cord. No epidural abscess is identified. Rim enhancing fluid within the anterior aspect of the intervertebral disc space as described above is consistent with abscess. Two small fluid collections at L4-5 seen on the prior examination have resolved. However, there is a new small fluid collection posterior to the left facet joint worrisome for abscess as described above. Marrow edema and enhancement in the pedicles and facets bilaterally is most worrisome for septic facet joints but could be due to degenerative disease. The central canal is open at L4-5. Electronically Signed   By: Inge Rise M.D.   On: 10/21/2016 16:23   Mr Lumbar Spine W Wo Contrast  Result Date: 10/21/2016 CLINICAL DATA:  Diabetic patient with a recent history of discitis and epidural abscess. Increasing left worse than right lower extremity weakness. EXAM: MRI THORACIC AND LUMBAR SPINE WITHOUT AND WITH CONTRAST TECHNIQUE: Multiplanar and multiecho pulse sequences of the thoracic and lumbar spine were obtained without and with intravenous contrast. CONTRAST:  17 ml MULTIHANCE GADOBENATE DIMEGLUMINE 529 MG/ML IV SOLN COMPARISON:  MRI lumbar spine 09/30/2016. CT abdomen and pelvis this same day. FINDINGS: MRI THORACIC SPINE FINDINGS Alignment:  Maintained. Vertebrae: Endplate destructive change is seen about the T8-9 level with marked marrow edema and enhancement throughout both vertebral bodies consistent with discitis and osteomyelitis. Edema and enhancement are also present of the T8 and T9 facets and pedicles. Extensive paraspinous inflammatory change is present about the T8 and T9 vertebral bodies. Cord:  Demonstrates normal signal. Paraspinal and other soft tissues: Small bilateral pleural effusions are seen. Disc levels: T8-9: No epidural abscess  is seen. There is extensive enhancement of epidural soft tissues and a disc bulge deforming the cord at T8-9. Rim enhancing fluid within the anterior aspect of the intervertebral disc space consistent with abscess measures 1.8 cm transverse by 0.9 cm AP by 0.9 cm craniocaudal. T5-6 and T6-7: Small left paracentral protrusions without central canal or foraminal stenosis. MRI LUMBAR SPINE FINDINGS Segmentation:  Standard. Alignment: Trace retrolisthesis L1 on L2 is unchanged. Otherwise maintained. Vertebrae: Heterogeneous marrow signal throughout is again seen. No fracture. Conus medullaris: Extends to the T12-L1 level and appears normal. Paraspinal and other soft tissues: Multiple bilateral renal cysts are again seen. Disc levels: L1-2: Trace retrolisthesis.  Central canal and foramina are open. L2-3:  Mild facet degenerative change.  Otherwise negative. L3-4: Mild to moderate facet arthropathy and is very small disc bulge. The central canal and foramina are open. L4-5: Advanced bilateral facet degenerative change is seen. Small epidural fluid collection subjacent to the ligamentum flavum on the right is no longer visualized. Fluid collection in the posterior paraspinous musculature on the left adjacent to the L4 spinous process has also resolved. There is marrow edema and mild enhancement in the L4 and L5 pedicles and facets, not markedly changed. A new small fluid collection off the posterior aspect of the left facet joint measures 0.6 cm transverse by 0.9 cm AP by 0.7 cm craniocaudal. Posterior epidural soft tissues at this level are edematous and enhancing. L5-S1: Facet arthropathy. Small annular fissure and shallow bulge. The central canal and foramina are open. IMPRESSION: Findings consistent with discitis and osteomyelitis with endplate destructive change about the T8-9 interspace. Extensive paraspinous inflammatory change extends into the epidural  space and there is a disc bulge at this level mildly deforming  the cord. No epidural abscess is identified. Rim enhancing fluid within the anterior aspect of the intervertebral disc space as described above is consistent with abscess. Two small fluid collections at L4-5 seen on the prior examination have resolved. However, there is a new small fluid collection posterior to the left facet joint worrisome for abscess as described above. Marrow edema and enhancement in the pedicles and facets bilaterally is most worrisome for septic facet joints but could be due to degenerative disease. The central canal is open at L4-5. Electronically Signed   By: Inge Rise M.D.   On: 10/21/2016 16:23   Mr Lumbar Spine W Wo Contrast  Result Date: 09/30/2016 CLINICAL DATA:  Back pain. Recent sepsis due to pyelonephritis and E coli bacteremia. EXAM: MRI LUMBAR SPINE WITHOUT AND WITH CONTRAST TECHNIQUE: Multiplanar and multiecho pulse sequences of the lumbar spine were obtained without and with intravenous contrast. CONTRAST:  66m MULTIHANCE GADOBENATE DIMEGLUMINE 529 MG/ML IV SOLN COMPARISON:  None. FINDINGS: Segmentation:  Normal segmentation. Alignment:  Mild retrolisthesis L1-2 Vertebrae: Negative for fracture or mass. Heterogeneous bone marrow diffusely. Possibly due to anemia. No evidence of discitis. Conus medullaris: Extends to the T12-L1 level and appears normal. Paraspinal and other soft tissues: Bilateral renal cysts. No retroperitoneal mass. Psoas muscles symmetric. There is edema in the paraspinous muscles bilaterally with enhancement suggesting myositis. Disc levels: L1-2: Mild retrolisthesis. Disc and facet degeneration. Mild spinal stenosis. L2-3:  Negative L3-4:  Mild disc and facet degeneration without stenosis L4-5: Diffuse disc bulging. Extensive facet degeneration with bony overgrowth and bilateral facet joint effusions. 5 mm posterior epidural fluid collection is present on the right side probably arising from the facet joint. There is surrounding epidural  enhancement above and below the cyst. This could represent a small abscess however could also be noninfected. In addition, there is a 8 mm fluid collection to the left of the L4 spinous process which does not enhance. This could be a small abscess as well. There is surrounding myositis. L5-S1:  Negative IMPRESSION: Heterogeneous bone marrow with overall benign appearance possibly due to anemia and prominent red marrow Severe facet degeneration at L4-5 with bilateral facet joint effusions. Right posterior epidural fluid collection 5 mm with surrounding epidural enhancement, concerning for epidural abscess versus synovial cyst. 8 mm fluid collection to the left of the L4 spinous process could also represent an abscess although it does not enhance as would be expected. There is edema and enhancement in the erector spinae muscles bilaterally suggesting myositis. Overall the findings are concerning for spinal infection. These results were called by telephone at the time of interpretation on 09/30/2016 at 6:21 pm to Dr. SOren Binet, who verbally acknowledged these results. Electronically Signed   By: CFranchot GalloM.D.   On: 09/30/2016 18:23   Dg C-arm 1-60 Min  Result Date: 10/22/2016 CLINICAL DATA:  Thoracic decompression and posterior-lateral arthrodesis. EXAM: THORACIC SPINE 2 VIEWS; DG C-ARM 61-120 MIN COMPARISON:  MRI thoracic spine January 18th. FLUOROSCOPY TIME:  62 seconds FINDINGS: Three intraoperative fluoroscopic spot views submitted, interpreting radiologist was not present at time of procedure. Initial radiograph demonstrates pedicle screws at 4 levels. Last image demonstrates two consecutive level pedicle screw fixation. IMPRESSION: Thoracic fusion. Electronically Signed   By: CElon AlasM.D.   On: 10/22/2016 21:23   Ct Renal Stone Study  Addendum Date: 10/21/2016   ADDENDUM REPORT: 10/21/2016 13:58 ADDENDUM: The original report  was by Dr. Van Clines. The following addendum is by  Dr. Van Clines: These results were called by telephone at the time of interpretation on 10/21/2016 at 1:55 pm to Dr. Tanna Furry , who verbally acknowledged these results. Electronically Signed   By: Van Clines M.D.   On: 10/21/2016 13:58   Result Date: 10/21/2016 CLINICAL DATA:  Abdominal pain and low back pain over the last 2-3 weeks. EXAM: CT ABDOMEN AND PELVIS WITHOUT CONTRAST TECHNIQUE: Multidetector CT imaging of the abdomen and pelvis was performed following the standard protocol without IV contrast. COMPARISON:  None. FINDINGS: Lower chest: Atelectasis in the right middle lobe with subsegmental atelectasis in both lower lobes. Paraspinal edema/ hematoma at the T8- 9 level, please see the musculoskeletal section below for further detail. Hepatobiliary: Small dependent densities in the gallbladder compatible with gallstones. Stable small hypodense 5 mm lesion in segment 3 of the liver. Pancreas: Unremarkable Spleen: Unremarkable Adrenals/Urinary Tract: Adrenal glands normal. Hypodense renal lesions are observed, most of fluid density but with a 5.2 by 3.7 cm mildly complex upper pole exophytic lesion with internal density 15 Hounsfield units. The other cysts are at about 2 Hounsfield units. Urinary bladder decompressed by a Foley catheter, no urinary tract stones identified. Stomach/Bowel: Appendix normal. Postoperative findings of the rectosigmoid junction. Prominent stool throughout the colon favors constipation. Several scattered descending colon and sigmoid diverticula. Vascular/Lymphatic: Mild aortoiliac atherosclerotic vascular disease. Reproductive: Unremarkable Other: No supplemental non-categorized findings. Musculoskeletal: There is an apparent fracture of the spinous process of T8 with scalloped irregular endplate destructive findings at the T8-9 level, compression fracture of T 8, posterior osseous ridging, and striking bilateral osseous foraminal stenosis at this level. I cannot  exclude discitis-osteomyelitis and on MRI of the thoracic spine with attention to this region should be considered. There is considerable paraspinal edema/hematoma. There is arthropathy of the adjacent costovertebral joints. Bridging spurring of the left sacroiliac joint. Mild spurring of both hips. Lumbar degenerative disc disease and facet arthropathy causing impingement at L3-4, L4-5, and L5-S1. IMPRESSION: 1. Complex fracture at T8-9 with endplate destruction of both levels and possible discitis -osteomyelitis, with extensive paraspinal density potentially from hematoma or phlegmon. There is both posterior osseous ridging and foraminal impingement at this level. I also suspect a fracture of the spinous process of T8. Given these findings, consider thoracic spine MRI with and without contrast for further characterization. 2. Scattered atelectasis in the lung bases. 3. Cholelithiasis. 4. Bilateral renal cysts. Mildly complex right kidney upper pole lesion is nonspecific but probably a complex cyst. 5.  Prominent stool throughout the colon favors constipation. 6. Lower lumbar impingement due to degenerative disc disease and facet arthropathy. Radiology assistant personnel have been notified to put me in telephone contact with the referring physician or the referring physician's clinical representative in order to discuss these findings. Once this communication is established I will issue an addendum to this report for documentation purposes. Electronically Signed: By: Van Clines M.D. On: 10/21/2016 13:52     Assessment/Plan  Lower extremity weakness From discitis/ osteomyelitis. S/p surgery. Will have him work with physical therapy and occupational therapy team to help with gait training and muscle strengthening exercises.fall precautions. Skin care. Encourage to be out of bed.   Thoracic discitis S/p thoracic decompression, discectomy, lateral arthrodesis and fixation.Continue ceftriaxone 2 g  every 12 hours and vancomycin 1 g every 8 hours until first of March 2018. Checks weekly CBC and BMP in biweekly ESR and CRP level. PICC line in  place.   Acute Thoracic pain With recent discitis. Continue Norco 5-325 milligram every 6 hours as needed for pain. Continue Robaxin 500 mg every 8 hours as needed for muscle spasm. Get PMR consult.  Acute blood loss anemia S/p surgical procedure, monitor cbc  Insulin-dependent diabetes mellitus Monitor blood sugar reading. Continue Lantus 15 units daily at bedtime .  Lab Results  Component Value Date   HGBA1C 7.3 (H) 10/21/2016   Lower extremity DVT continue Xarelto 20 mg daily and monitor  Chronic constipation Continue Colace 100 mg twice a day, MiraLAX twice a day and Dulcolax 5 mg daily as needed and monitor.   Goals of care: short term rehabilitation   Labs/tests ordered: CBC, CMP every Monday until 12/02/16 and ESR and CRP every other week  Family/ staff Communication: reviewed care plan with patient and nursing supervisor    Blanchie Serve, MD Internal Medicine [place], [place] [address] Cell Phone (Monday-Friday 8 am - 5 pm): [phone] On Call: [phone] and follow prompts after 5 pm and on weekends Office Phone: [phone] Office Fax: [phone]

## 2016-10-29 ENCOUNTER — Other Ambulatory Visit: Payer: Self-pay | Admitting: Licensed Clinical Social Worker

## 2016-10-29 ENCOUNTER — Telehealth: Payer: Self-pay | Admitting: *Deleted

## 2016-10-29 DIAGNOSIS — G8222 Paraplegia, incomplete: Secondary | ICD-10-CM | POA: Diagnosis not present

## 2016-10-29 NOTE — Telephone Encounter (Signed)
Tried calling pt to set-up TCM appt no answer LMOM RTC.../lmb 

## 2016-10-29 NOTE — Patient Outreach (Signed)
[facility]) Care Management  [facility] Social Work  10/29/2016  Charles Parker [birth year] [id number]  Encounter Medications:  Outpatient Encounter Prescriptions as of 10/29/2016  Medication Sig  . bisacodyl (DULCOLAX) 5 MG EC tablet Take 1 tablet (5 mg total) by mouth daily as needed for moderate constipation.  . cefTRIAXone (ROCEPHIN) IVPB Inject 2 g into the vein every 12 (twelve) hours. Indication: osteomyelitis Last Day of Therapy:  12/02/16 Labs - Once weekly:  CBC/D and BMP, Labs - Every other week:  ESR and CRP  . docusate sodium (COLACE) 100 MG capsule Take 1 capsule (100 mg total) by mouth 2 (two) times daily.  Marland Kitchen HYDROcodone-acetaminophen (NORCO/VICODIN) 5-325 MG tablet Take 1 tablet by mouth every 6 (six) hours as needed for moderate pain.  Marland Kitchen insulin glargine (LANTUS) 100 UNIT/ML injection Inject 25 Units into the skin at bedtime.  . Insulin Pen Needle 31G X 5 MM MISC Administer lantus once nightly  . methocarbamol (ROBAXIN) 500 MG tablet Take 1 tablet (500 mg total) by mouth every 8 (eight) hours as needed for muscle spasms.  . Multiple Vitamin (MULTIVITAMIN WITH MINERALS) TABS tablet Take 1 tablet by mouth daily.  . polyethylene glycol (MIRALAX / GLYCOLAX) packet Take 17 g by mouth 2 (two) times daily.  . rivaroxaban (XARELTO) 20 MG TABS tablet Take 1 tablet (20 mg total) by mouth daily with supper.  . simvastatin (ZOCOR) 40 MG tablet TAKE 1 TABLET(40 MG) BY MOUTH EVERY EVENING  . tamsulosin (FLOMAX) 0.4 MG CAPS capsule Take 0.4 mg by mouth daily after breakfast.   . vancomycin IVPB Inject 1,000 mg into the vein every 8 (eight) hours. Indication: osteomyelitis Last Day of Therapy:  12/02/16 Labs - Sunday/Monday:  CBC/D, BMP, and vancomycin trough. Labs - Thursday:  BMP and vancomycin trough Labs - Every other week:  ESR and CRP  . zolpidem (AMBIEN) 5 MG tablet Take 1 tablet (5 mg total) by mouth at bedtime as needed for sleep.   No facility-administered  encounter medications on file as of 10/29/2016.     Functional Status:  In your present state of health, do you have any difficulty performing the following activities: 10/22/2016 10/22/2016  Hearing? - N  Vision? - N  Difficulty concentrating or making decisions? - N  Walking or climbing stairs? - Y  Dressing or bathing? - Y  Doing errands, shopping? Y -  Conservation officer, nature and eating ? - -  Using the Toilet? - -  In the past six months, have you accidently leaked urine? - -  Do you have problems with loss of bowel control? - -  Managing your Medications? - -  Managing your Finances? - -  Housekeeping or managing your Housekeeping? - -  Some recent data might be hidden    Fall/Depression Screening:  PHQ 2/9 Scores 10/29/2016 10/08/2016 09/22/2016 08/24/2016 01/01/2016 08/18/2015 07/17/2015  PHQ - 2 Score 0 0 0 0 0 0 0    Assessment: CSW completed SNF visit at [facility] with patient and patient's brother present. Patient was sent to the ED by SNF physician for weakness in the lower extremities. Procedures that were done include: 10/22/2016 - Thoracic 8th, 9th, decompression, discectomy, post lateral arthrodesis T6-T11, post fixation t6-T11. Patient returned to [facility] on 10/28/15. Patient's brother reports that he used the Medicaid application that CSW left for him and completed it and faxed over the needed documents. He reports that at this time they are waiting for a Medicaid pending  number. Family state that they were informed that patient will have a discharge date around the first week of March 2018. Patient reports that he will start back physical therapy on 11/01/16.   Patient reports that his pain is at a 8 today. However, he reports "it isn't as bad as it usually is." He reports that he is sleeping 8-9 hours per night. He states that his appetite is "normal." Patient denies experiencing any depressive symptoms. [place] denies needing to make any changes to his advance directives.  CSW spent time reviewing community resources for him to consider post discharge. Family assist patient with transportation to all of his appointments. Family was educated on [facility] and [employer] in case they were to ever want services. Patient is not interested at this time. Patient is currently receiving $14.00 per month with food stamps. CSW discussed financial resources within [facility]. Patient DOES NOT want LTC placement and would prefer to be as independent as possible. Family agree that they do not want LTC placement. CSW educated family on personal care services through [facility] (Medicaid funded.) Family is interested in assistance with this but will first need Medicaid approval and will also need to be discharged from SNF before CSW can assist. Family expressed understanding. CSW provided education on ILF placement. Family appreciative of this resource information. Patient reports that his church members, friends and family have come to visit him and support him throughout this time.   Plan: CSW will follow up with SNF within one week. CSW will continue to provide social work case and provide support and assistance when needed.  [name], BSW, MSW, [email or web address] Phone: [phone] Fax: [phone]

## 2016-11-01 DIAGNOSIS — G8222 Paraplegia, incomplete: Secondary | ICD-10-CM | POA: Diagnosis not present

## 2016-11-01 LAB — CBC AND DIFFERENTIAL
HCT: 30 % — AB (ref 41–53)
Hemoglobin: 9.6 g/dL — AB (ref 13.5–17.5)
Platelets: 386 10*3/uL (ref 150–399)
WBC: 6.1 10^3/mL

## 2016-11-01 LAB — BASIC METABOLIC PANEL
BUN: 11 mg/dL (ref 4–21)
CREATININE: 0.6 mg/dL (ref 0.6–1.3)
Glucose: 63 mg/dL
POTASSIUM: 4.1 mmol/L (ref 3.4–5.3)
SODIUM: 143 mmol/L (ref 137–147)

## 2016-11-01 NOTE — Telephone Encounter (Signed)
Called pt again no answer LMOM pls call to schedule hosp f/u appt. Closing encounter...Charles Parker

## 2016-11-03 DIAGNOSIS — G8222 Paraplegia, incomplete: Secondary | ICD-10-CM | POA: Diagnosis not present

## 2016-11-05 DIAGNOSIS — G8222 Paraplegia, incomplete: Secondary | ICD-10-CM | POA: Diagnosis not present

## 2016-11-08 ENCOUNTER — Encounter: Payer: Self-pay | Admitting: Family

## 2016-11-08 ENCOUNTER — Other Ambulatory Visit: Payer: Self-pay | Admitting: Licensed Clinical Social Worker

## 2016-11-08 ENCOUNTER — Non-Acute Institutional Stay (SKILLED_NURSING_FACILITY): Payer: Medicare HMO | Admitting: Family

## 2016-11-08 DIAGNOSIS — R509 Fever, unspecified: Secondary | ICD-10-CM

## 2016-11-08 DIAGNOSIS — R079 Chest pain, unspecified: Secondary | ICD-10-CM | POA: Diagnosis not present

## 2016-11-08 DIAGNOSIS — G8222 Paraplegia, incomplete: Secondary | ICD-10-CM | POA: Diagnosis not present

## 2016-11-08 LAB — CBC AND DIFFERENTIAL
HEMATOCRIT: 30 % — AB (ref 41–53)
HEMOGLOBIN: 9.6 g/dL — AB (ref 13.5–17.5)
Platelets: 232 10*3/uL (ref 150–399)
WBC: 5.3 10*3/mL

## 2016-11-08 LAB — BASIC METABOLIC PANEL
BUN: 15 mg/dL (ref 4–21)
Creatinine: 0.6 mg/dL (ref 0.6–1.3)
GLUCOSE: 253 mg/dL
Potassium: 3.8 mmol/L (ref 3.4–5.3)
Sodium: 137 mmol/L (ref 137–147)

## 2016-11-08 MED ORDER — NITROGLYCERIN 0.4 MG SL SUBL: 0.4000 mg | SUBLINGUAL_TABLET | SUBLINGUAL | 3 refills | Status: DC | PRN

## 2016-11-08 NOTE — Patient Outreach (Signed)
[facility]) Care Management  11/08/2016  HITESH CLARKIN 1946/06/23 [id number]   Assessment- CSW received incoming call from patient's brother on 11/08/16. Brother states that he was given an appointment reminder and saw that CSW had appointment with him today and he wanted to clarify. CSW informed brother that this was a phone call outreach and not a SNF visit. Brother reports that patient is doing very well at SNF. He states that he has started to use his wheelchair and eat his lunch with all of the other residents. Brother reports that patient's pain is tolerable at this time. Brother reports that they were informed that patient should be at facility until the first week of March of 2018. Family report that as no further d discharge plans or information has been discussed with them.   Plan-CSW will follow up within one week and continue to provide social work assistance.  [name], BSW, MSW, [email or web address] Phone: [phone] Fax: [phone]

## 2016-11-08 NOTE — Progress Notes (Signed)
Location:  [facility] Room Number: 106-P Place of Service:  SNF [phone]) Provider:  Karsten Howry FNP-C  Scarlette Calico, MD  Patient Care Team: Janith Lima, MD as PCP - General (Internal Medicine) Wilford Corner, MD as Consulting Physician (Gastroenterology) Greg Cutter, LCSW as [facility] (Licensed Clinical Social Worker)  Extended Emergency Contact Information Primary Emergency Contact: Housand,Anthony Address: [address]          [place], [place] [address] [place] Phone: [phone] Mobile Phone: [phone] Relation: Brother Secondary Emergency Contact: Hazeline Junker [place] Mobile Phone: [phone] Relation: Brother  Code Status:  Full Code  Goals of care: Advanced Directive information Advanced Directives 10/29/2016  Does Patient Have a Medical Advance Directive? Yes  Type of Advance Directive -  Does patient want to make changes to medical advance directive? No - Patient declined  Copy of [place] in Chart? -  Would patient like information on creating a medical advance directive? -  Pre-existing out of facility DNR order (yellow form or pink MOST form) -     Chief Complaint  Patient presents with  . Acute Visit    Chest pain    HPI:  Pt is a 71 y.o. male seen today at [facility]  for an acute visit for evaluation of complains of chest pain. He has a medical history of Type 2 DM, DVT, Dyslipidemia, BPH among other conditions. He is seen in his room today per pain management specialist request. He states having pain on mid lower chest described as " achy". He denies any radiation to arm or jaw. He also denies any fever, cough, shortness of breath, dizziness, headache, Nausea or vomiting. Of note he is here for short term rehabilitation post hospital admission from 10/21/2016-10/27/2016  with worsening weakness of both his  lower legs. He underwent thoracic decompression, discectomy, lateral arthrodesis and fixation.    Past Medical History:  Diagnosis Date  . Clotting disorder (HCC)    DVT  . Diabetes mellitus    type II  . Diverticulosis   . DVT (deep venous thrombosis) ([place])   . Glaucoma    no eye drops   . Hepatic cyst   . Hypercholesterolemia   . Hypertension   . Myocardial infarction    very mild long ago   . Renal cyst   . Sleep apnea   . Tubulovillous adenoma    Past Surgical History:  Procedure Laterality Date  . BLADDER NECK RECONSTRUCTION  02/03/2012   Procedure: BLADDER NECK REPAIR;  Surgeon: Adin Hector, MD;  Location: [facility] ORS;  Service: General;;  . COLONOSCOPY  02/2013   polyp removal(mult.)  . KNEE SURGERY  2004   left  . POLYPECTOMY    . PROCTOSCOPY  02/03/2012   Procedure: PROCTOSCOPY;  Surgeon: Adin Hector, MD;  Location: [facility] ORS;  Service: General;;  . STOMACH SURGERY  02/2012  . TEE WITHOUT CARDIOVERSION N/A 10/05/2016   Procedure: TRANSESOPHAGEAL ECHOCARDIOGRAM (TEE);  Surgeon: Sanda Klein, MD;  Location: [facility] ENDOSCOPY;  Service: Cardiovascular;  Laterality: N/A;    Allergies  Allergen Reactions  . Metformin And Related Diarrhea  . Codeine Rash    All over the body    Allergies as of 11/08/2016      Reactions   Metformin And Related Diarrhea   Codeine Rash   All over the body  Medication List       Accurate as of 11/08/16  1:32 PM. Always use your most recent med list.          bisacodyl 5 MG EC tablet Commonly known as:  DULCOLAX Take 1 tablet (5 mg total) by mouth daily as needed for moderate constipation.   cefTRIAXone IVPB Commonly known as:  ROCEPHIN Inject 2 g into the vein every 12 (twelve) hours. Indication: osteomyelitis Last Day of Therapy:  12/02/16 Labs - Once weekly:  CBC/D and BMP, Labs - Every other week:  ESR and CRP   HYDROcodone-acetaminophen 5-325 MG tablet Commonly known as:  NORCO/VICODIN Take 1 tablet by mouth every 6 (six)  hours as needed for moderate pain.   insulin glargine 100 UNIT/ML injection Commonly known as:  LANTUS Inject 25 Units into the skin at bedtime.   Insulin Pen Needle 31G X 5 MM Misc Administer lantus once nightly   LIDOCARE ARM/NECK/LEG 4 % Ptch Generic drug:  Lidocaine Apply topically every 12 (twelve) hours. Apply 2 patches to left knee   methocarbamol 500 MG tablet Commonly known as:  ROBAXIN Take 1 tablet (500 mg total) by mouth every 8 (eight) hours as needed for muscle spasms.   multivitamin with minerals Tabs tablet Take 1 tablet by mouth daily.   nitroGLYCERIN 0.4 MG SL tablet Commonly known as:  NITROSTAT Place 1 tablet (0.4 mg total) under the tongue every 5 (five) minutes as needed for chest pain.   oxybutynin 5 MG tablet Commonly known as:  DITROPAN Take 2.5 mg by mouth 2 (two) times daily.   polyethylene glycol packet Commonly known as:  MIRALAX / GLYCOLAX Take 17 g by mouth 2 (two) times daily.   rivaroxaban 20 MG Tabs tablet Commonly known as:  XARELTO Take 1 tablet (20 mg total) by mouth daily with supper.   simvastatin 40 MG tablet Commonly known as:  ZOCOR TAKE 1 TABLET(40 MG) BY MOUTH EVERY EVENING   tamsulosin 0.4 MG Caps capsule Commonly known as:  FLOMAX Take 0.4 mg by mouth daily after breakfast.   vancomycin IVPB Inject 1,000 mg into the vein every 8 (eight) hours. Indication: osteomyelitis Last Day of Therapy:  12/02/16 Labs - Sunday/Monday:  CBC/D, BMP, and vancomycin trough. Labs - Thursday:  BMP and vancomycin trough Labs - Every other week:  ESR and CRP   zolpidem 5 MG tablet Commonly known as:  AMBIEN Take 1 tablet (5 mg total) by mouth at bedtime as needed for sleep.       Review of Systems  Constitutional: Negative for activity change, appetite change, chills, fatigue and fever.  HENT: Negative for congestion, rhinorrhea, sinus pain, sinus pressure, sneezing and sore throat.   Eyes: Negative.   Respiratory: Negative for cough,  chest tightness, shortness of breath and wheezing.   Cardiovascular: Negative for palpitations and leg swelling.       Chest pain per HPI   Gastrointestinal: Negative for abdominal distention, abdominal pain, constipation, diarrhea, nausea and vomiting.  Endocrine: Negative.   Genitourinary: Negative for dysuria, flank pain, frequency and urgency.  Musculoskeletal: Positive for back pain and gait problem.  Skin: Negative for color change, pallor and rash.  Neurological: Negative for dizziness, seizures, syncope, light-headedness and headaches.       Leg weakness   Hematological: Does not bruise/bleed easily.  Psychiatric/Behavioral: Negative for agitation, confusion, hallucinations and sleep disturbance. The patient is not nervous/anxious.     Immunization History  Administered Date(s) Administered  . Influenza,inj,Quad  PF,36+ Mos 06/20/2013, 07/10/2014  . Influenza-Unspecified 06/05/2015, 07/26/2016  . PPD Test 10/29/2016  . Pneumococcal Conjugate-13 06/20/2013, 07/23/2014  . Pneumococcal Polysaccharide-23 06/19/2015  . Tdap 06/20/2013   Pertinent  Health Maintenance Due  Topic Date Due  . OPHTHALMOLOGY EXAM  09/15/2016  . FOOT EXAM  11/10/2016  . URINE MICROALBUMIN  11/10/2016  . HEMOGLOBIN A1C  04/20/2017  . COLONOSCOPY  07/14/2026  . INFLUENZA VACCINE  Completed  . PNA vac Low Risk Adult  Completed   Fall Risk  10/08/2016 09/22/2016 09/02/2016 08/24/2016 01/01/2016  Falls in the past year? No No Yes No No  Number falls in past yr: - - 1 - -  Risk for fall due to : - - Impaired balance/gait;Impaired mobility;Impaired vision;Medication side effect Impaired vision;Medication side effect -  Follow up - - Follow up appointment - -    Vitals:   11/08/16 1012  BP: 120/82  Pulse: 98  Resp: 20  Temp: 99.1 F (37.3 C)  TempSrc: Oral  SpO2: 95%  Weight: 174 lb (78.9 kg)  Height: 6' (1.829 m)   Body mass index is 23.6 kg/m. Physical Exam  Constitutional: He is oriented to  person, place, and time. He appears well-developed and well-nourished. No distress.  HENT:  Head: Normocephalic.  Mouth/Throat: Oropharynx is clear and moist. No oropharyngeal exudate.  Eyes: Conjunctivae and EOM are normal. Pupils are equal, round, and reactive to light. Right eye exhibits no discharge. Left eye exhibits no discharge. No scleral icterus.  Neck: Normal range of motion. No JVD present. No thyromegaly present.  Cardiovascular: Normal heart sounds and intact distal pulses.  Exam reveals no gallop and no friction rub.   No murmur heard. Tachycardia   Pulmonary/Chest: Effort normal. No respiratory distress. He has no wheezes. He has no rales.  Diminished right base  Abdominal: Soft. Bowel sounds are normal. He exhibits no distension. There is no tenderness. There is no rebound and no guarding.  Musculoskeletal: He exhibits no edema, tenderness or deformity.  Moves x 4 extremities. Decreased strength to lower extremities worst on left leg.  Lymphadenopathy:    He has no cervical adenopathy.  Neurological: He is oriented to person, place, and time.  Skin: Skin is warm and dry. No rash noted. No erythema. No pallor.  Upper back surgical incision staples dry, clean and intact. Surrounding skin tissue without any signs of infection.   Psychiatric: He has a normal mood and affect.    Labs reviewed:  Recent Labs  08/30/16 1609  09/28/16 0516  10/25/16 0216 10/26/16 0733 10/27/16 0315 11/01/16  NA 130*  < > 138  < > 136 138 140 143  K 3.9  < > 4.2  < > 3.9 3.5 3.5 4.1  CL 94*  < > 108  < > 103 104 102  --   CO2 23  < > 24  < > '25 25 27  ' --   GLUCOSE 320*  < > 98  < > 160* 104* 153*  --   BUN 13  < > 10  < > '12 10 10 11  ' CREATININE 0.96  < > 0.81  < > 0.72 0.68 0.63 0.6  CALCIUM 8.8  9.1  < > 8.7*  < > 8.4* 8.6* 8.8*  --   MG 1.9  --  1.6*  --  1.7  --   --   --   PHOS 4.2  --   --   --   --   --   --   --   < > =  values in this interval not displayed.  Recent Labs   09/26/16 1835 09/28/16 0516 10/11/16 10/21/16 1258  AST 32 '21 19 22  ' ALT 21 15* 21 16*  ALKPHOS 74 58 71 56  BILITOT 0.8 0.2*  --  0.3  PROT 8.5* 6.8  --  7.4  ALBUMIN 2.9* 2.3*  --  2.5*    Recent Labs  09/28/16 0516  10/07/16 0734  10/21/16 1258  10/25/16 0216 10/26/16 0733 10/27/16 0315 11/01/16  WBC 9.7  < > 7.0  < > 6.5  < > 12.2* 9.5 8.0 6.1  NEUTROABS 7.1  --  4.9  --  4.6  --   --   --   --   --   HGB 10.1*  < > 10.5*  < > 10.0*  < > 8.9* 8.5* 8.6* 9.6*  HCT 31.6*  < > 33.7*  < > 32.1*  < > 28.1* 27.1* 26.8* 30*  MCV 88.8  < > 89.6  --  88.4  < > 86.7 86.3 85.4  --   PLT 240  < > 327  < > 236  < > 210 249 272 386  < > = values in this interval not displayed. Lab Results  Component Value Date   TSH 0.67 09/08/2016   Lab Results  Component Value Date   HGBA1C 7.3 (H) 10/21/2016   Lab Results  Component Value Date   CHOL 195 11/11/2015   HDL 43.30 11/11/2015   LDLCALC 151 (H) 06/18/2015   LDLDIRECT 118.0 11/11/2015   TRIG 210.0 (H) 11/11/2015   CHOLHDL 4 11/11/2015   Assessment/Plan  Chest pain  Seems to be none cardiac related.Status post thoracic decompression, discectomy, lateral arthrodesis and fixation.EKG order prior to visit showed sinus Tachy with nonspecific Twave abnormality.chest pain relieved with pain medication.Temp 99.1 Nitro given x 1 dose. Will obtain portable CXR Pa/Lat rule out pneumonia. Monitor vital signs every 30 minutes x 1 hour then change to every shift x 5 days. CBC/diff, BMP 11/08/2016. Send to ER if pain worsening or no relief. Continue to monitor.   Low grade fever  Temp 99.1. Back incision site staples intact without any signs of infections.Obtain portable CXR Pa/lat as above. Urine specimen for U/A and C/S. CBC/diff, BMP 11/08/2016   Family/ staff Communication: Reviewed plan of care with patient and facility Nurse supervisor.   Labs/tests ordered: portable CXR Pa/Lat rule out pneumonia. Urine specimen for U/A and C/S. CBC/diff,  BMP 11/08/2016

## 2016-11-09 ENCOUNTER — Other Ambulatory Visit: Payer: Self-pay | Admitting: Licensed Clinical Social Worker

## 2016-11-09 ENCOUNTER — Inpatient Hospital Stay: Payer: Commercial Managed Care - HMO | Admitting: Internal Medicine

## 2016-11-09 NOTE — Patient Outreach (Signed)
[facility]) Care Management  11/09/2016  Charles Parker [birth year] [id number]   Assessment- CSW completed outreach call to SNF discharge planner to inquire about expected discharge date. CSW was unable to reach her but left a HIPPA compliant voice message requesting return call.  Plan- CSW will await for return phone call or complete additional outreach within one week.  [name], BSW, MSW, [name] Phone: [phone] Fax: [phone]

## 2016-11-10 ENCOUNTER — Inpatient Hospital Stay
Admission: RE | Admit: 2016-11-10 | Discharge: 2016-11-10 | Disposition: A | Discharge status: Home / Self Care | Payer: Commercial Managed Care - HMO | Source: Ambulatory Visit | Attending: Internal Medicine | Admitting: Internal Medicine

## 2016-11-10 DIAGNOSIS — G8222 Paraplegia, incomplete: Secondary | ICD-10-CM | POA: Diagnosis not present

## 2016-11-12 DIAGNOSIS — G8222 Paraplegia, incomplete: Secondary | ICD-10-CM | POA: Diagnosis not present

## 2016-11-15 ENCOUNTER — Other Ambulatory Visit: Payer: Self-pay | Admitting: Licensed Clinical Social Worker

## 2016-11-15 DIAGNOSIS — I10 Essential (primary) hypertension: Secondary | ICD-10-CM | POA: Diagnosis not present

## 2016-11-15 DIAGNOSIS — G8222 Paraplegia, incomplete: Secondary | ICD-10-CM | POA: Diagnosis not present

## 2016-11-15 DIAGNOSIS — M4624 Osteomyelitis of vertebra, thoracic region: Secondary | ICD-10-CM | POA: Diagnosis not present

## 2016-11-15 NOTE — Patient Outreach (Signed)
[facility]) Care Management  11/15/2016  TIA MAGA 1946-01-25 [id number]   Assessment- CSW completed outreach [facility] social worker but was unable to reach her. CSW left a HIPPA compliant voice message for the SNF social worker encouraging return all with updates in regards to patient's expected discharge.   CSW completed call to patient's brother and he answered. He reports that patient is doing very well at SNF. He states that patient is standing up on his own and is able to take a few steps with the aide of a bar during therapy. He states that he has an appointment today with his surgeon and then another appointment tomorrow at the infectious disease center. He reports that he had to have a discussion with the dietitian at the facility because they felt patient needed more food than what he was getting during his meals. He shares that patient has lost a lot of weight because of his surgery but his appetite is very good. Brother reports that no one has given him any information in regards to an expected discharge.   Plan-CSW will follow up within one week and continue to provide social work assistance.  [name], BSW, MSW, [email or web address] Phone: [phone] Fax: [phone]

## 2016-11-16 ENCOUNTER — Encounter: Payer: Self-pay | Admitting: Internal Medicine

## 2016-11-16 ENCOUNTER — Ambulatory Visit (INDEPENDENT_AMBULATORY_CARE_PROVIDER_SITE_OTHER): Payer: Medicare HMO | Admitting: Internal Medicine

## 2016-11-16 DIAGNOSIS — G061 Intraspinal abscess and granuloma: Secondary | ICD-10-CM | POA: Diagnosis not present

## 2016-11-16 DIAGNOSIS — R29898 Other symptoms and signs involving the musculoskeletal system: Secondary | ICD-10-CM | POA: Diagnosis not present

## 2016-11-16 DIAGNOSIS — G8918 Other acute postprocedural pain: Secondary | ICD-10-CM | POA: Diagnosis not present

## 2016-11-16 DIAGNOSIS — M4624 Osteomyelitis of vertebra, thoracic region: Secondary | ICD-10-CM | POA: Diagnosis not present

## 2016-11-16 NOTE — Progress Notes (Signed)
Confirmed with [facility] place that he has been on both vanc/rocephin rather than just vanc on the [facility]. Dr. Linus Salmons is now aware.

## 2016-11-16 NOTE — Assessment & Plan Note (Signed)
Pain in control and able to walk with assisstance

## 2016-11-16 NOTE — Assessment & Plan Note (Signed)
This is slowly improving and continuing rehab.

## 2016-11-16 NOTE — Progress Notes (Signed)
   Subjective:    Patient ID: Charles Parker, male    DOB: 1945-12-29, 71 y.o.   MRN: [id number]  HPI Here for follow up on discitis with epidural abscess and positive E coli bacteremia.    Initially diagnosed in December with E coli and :MRI findings as above and treated with ceftriaxone for 6 weeks.  He represented in January with progressive weakness, pain and MRI with resolution of some areas and new areas of concern.  Underwent surgical debridement.  No new cultures grew anything.  He is progressively improving from his weakness.  Saw Dr. Christella Noa yesterday and had staples out.  No associated n/v/d.  In review of the MAR, I only see vancomycin, no ceftriaxone.  Labs reviewed and most recent from 2/5 with normal creat, normal WBC.  No fever, no chills.     Review of Systems  Constitutional: Negative for fatigue.  Gastrointestinal: Negative for diarrhea and nausea.  Musculoskeletal: Negative for back pain.  Neurological: Negative for light-headedness.       Objective:   Physical Exam  Constitutional: He appears well-developed and well-nourished. No distress.  Eyes: No scleral icterus.  Cardiovascular: Normal rate, regular rhythm and normal heart sounds.   Skin: No rash noted.  Psychiatric: He has a normal mood and affect.    SH: no tobacco      Assessment & Plan:

## 2016-11-16 NOTE — Assessment & Plan Note (Addendum)
Will need prolonged treatment with ceftriaxone through 3/1 then will continue with Keflex 500 mg qid or bactrim DS 1tab bid starting 3/2 for 2-3 months Needs ESR, CRP from SNF this week

## 2016-11-16 NOTE — Assessment & Plan Note (Signed)
This had resolved on previous MRI.

## 2016-11-17 ENCOUNTER — Other Ambulatory Visit: Payer: Self-pay | Admitting: Licensed Clinical Social Worker

## 2016-11-17 DIAGNOSIS — G8222 Paraplegia, incomplete: Secondary | ICD-10-CM | POA: Diagnosis not present

## 2016-11-17 NOTE — Patient Outreach (Signed)
[facility]) Care Management  [facility] Social Work  11/17/2016  Charles Parker [birth year] [id number]  Encounter Medications:  Outpatient Encounter Prescriptions as of 11/17/2016  Medication Sig  . bisacodyl (DULCOLAX) 5 MG EC tablet Take 1 tablet (5 mg total) by mouth daily as needed for moderate constipation.  . cefTRIAXone (ROCEPHIN) IVPB Inject 2 g into the vein every 12 (twelve) hours. Indication: osteomyelitis Last Day of Therapy:  12/02/16 Labs - Once weekly:  CBC/D and BMP, Labs - Every other week:  ESR and CRP  . HYDROcodone-acetaminophen (NORCO/VICODIN) 5-325 MG tablet Take 1 tablet by mouth every 6 (six) hours as needed for moderate pain.  Marland Kitchen insulin glargine (LANTUS) 100 UNIT/ML injection Inject 25 Units into the skin at bedtime.  . Insulin Pen Needle 31G X 5 MM MISC Administer lantus once nightly  . Lidocaine (LIDOCARE ARM/NECK/LEG) 4 % PTCH Apply topically every 12 (twelve) hours. Apply 2 patches to left knee  . methocarbamol (ROBAXIN) 500 MG tablet Take 1 tablet (500 mg total) by mouth every 8 (eight) hours as needed for muscle spasms.  . Multiple Vitamin (MULTIVITAMIN WITH MINERALS) TABS tablet Take 1 tablet by mouth daily.  . nitroGLYCERIN (NITROSTAT) 0.4 MG SL tablet Place 1 tablet (0.4 mg total) under the tongue every 5 (five) minutes as needed for chest pain.  Marland Kitchen oxybutynin (DITROPAN) 5 MG tablet Take 2.5 mg by mouth 2 (two) times daily.  . polyethylene glycol (MIRALAX / GLYCOLAX) packet Take 17 g by mouth 2 (two) times daily.  . rivaroxaban (XARELTO) 20 MG TABS tablet Take 1 tablet (20 mg total) by mouth daily with supper.  . simvastatin (ZOCOR) 40 MG tablet TAKE 1 TABLET(40 MG) BY MOUTH EVERY EVENING  . tamsulosin (FLOMAX) 0.4 MG CAPS capsule Take 0.4 mg by mouth daily after breakfast.   . vancomycin IVPB Inject 1,000 mg into the vein every 8 (eight) hours. Indication: osteomyelitis Last Day of Therapy:  12/02/16 Labs - Sunday/Monday:  CBC/D, BMP, and  vancomycin trough. Labs - Thursday:  BMP and vancomycin trough Labs - Every other week:  ESR and CRP  . zolpidem (AMBIEN) 5 MG tablet Take 1 tablet (5 mg total) by mouth at bedtime as needed for sleep.   No facility-administered encounter medications on file as of 11/17/2016.     Functional Status:  In your present state of health, do you have any difficulty performing the following activities: 10/22/2016 10/22/2016  Hearing? - N  Vision? - N  Difficulty concentrating or making decisions? - N  Walking or climbing stairs? - Y  Dressing or bathing? - Y  Doing errands, shopping? Y -  Conservation officer, nature and eating ? - -  Using the Toilet? - -  In the past six months, have you accidently leaked urine? - -  Do you have problems with loss of bowel control? - -  Managing your Medications? - -  Managing your Finances? - -  Housekeeping or managing your Housekeeping? - -  Some recent data might be hidden    Fall/Depression Screening:  PHQ 2/9 Scores 10/29/2016 10/08/2016 09/22/2016 08/24/2016 01/01/2016 08/18/2015 07/17/2015  PHQ - 2 Score 0 0 0 0 0 0 0    Assessment: CSW completed SNF visit with patient at [facility] on 11/18/15. Patient was sitting up in his wheelchair. Patient is currently wearing a back brace which he states that he wears when he is sitting straight up and in therapy. He reports that his pain is "a 5" today.  He states that he is very thankful that his pain has decreased. He states that he went to a follow up surgery appointment on 11/16/15 and they were able to take his staples out. He reports that his surgeon reports that the surgery was successful and is healing well. He reports that his youngest brother visited him today and just left. He reports that all of his family continue to come to SNF almost daily to visit and spend time with him. He reports that his appetite and sleep are "good." He states that his walking has improved. He shares that his left leg gives at out times.  Patient reports that he walked a little bit today and yesterday with the assistance of his walker. He reports that he stood up with the assistance of a bar during physical therapy for a minute and 47 seconds three times today. He states that his medications are effectively helping him manage his pain. He reports that he has a friend coming to visit him this afternoon. He denies experiencing any depressive symptoms. He states that he has not been participating in the socialization events at SNF but that he uses his wheelchair and goes to the dining area to eat and socialize with others on some evenings. Patient was encourage to go to the Valentine's Day party this afternoon at SNF. Patient reports that he was told that he will discharge back home from SNF around the first week of March of 2018. He states that he is unsure on whether he will temporarily stay at his brothers (he has an extra bedroom for patient) or if brother will stay with him. CSW encouraged him to create safe and stable discharge plan to help with transition back home from SNF.  CSW went to SNF social worker's office and spoke to her but she had no updates on patient. She reports that they will have a care team planning meeting tomorrow and to contact her by phone for updates.  CSW completed call to patient's Medicaid caseworker Orene Desanctis at [phone] and he confirmed that patient's Medicaid has been approved and his Medicaid ID number is [id number]. CSW will assist with personal care services once he discharges.   [facility] CM Care Plan Problem One   Flowsheet Row Most Recent Value  Care Plan Problem One  SNF admission and lack of resources  Role Documenting the Problem One  Clinical Social Worker  Care Plan for Problem One  Active  [facility] Long Term Goal (31-90 days)  Patient will have a safe and stable discharging back home from SNF within 90 days per self report  [facility] Long Term Goal Start Date  10/08/16  Interventions for Problem One  Long Term Goal  CSW will coordinate care with SNF social worker, family and patient to ensure a safe discharge from [employer] back home with the needed medical equipment, [facility] services and resources in place.   [facility] CM Short Term Goal #1 (0-30 days)  Patient will gain food stamps within 30 days as evidenced by checking his mail and informing CSW  [facility] CM Short Term Goal #1 Start Date  10/08/16  [facility] CM Short Term Goal #1 Met Date  11/17/16  Interventions for Short Term Goal #1  Goal met  [facility] CM Short Term Goal #2 (0-30 days)  Patient will attend all scheduled medical appointments within the next 30 days  [facility] CM Short Term Goal #2 Start Date  10/08/16  [facility] CM Short Term Goal #2 Met Date  11/17/16  Interventions for Short Term Goal #2  Goal successfully met.  [facility] CM Short Term Goal #3 (0-30 days)  Patient will create a safe plan for his transition back home within 30 days per family and patient report  [facility] CM Short Term Goal #3 Start Date  11/17/16  Interventions for Short Tern Goal #3  CSW has completed additional SNF visit and patient is unsure if he wishes to stay at brothers for a short time to help smooth transition from SNF or if brother should stay with him while CSW assist with gaining an aide. CSW will provide MI intervention and encourage a safe discharge back home.      Plan: CSW will follow up within one-two weeks. CSW will continue to provide social work assistance to patient.  [name], BSW, MSW, [email or web address] Phone: [phone] Fax: [phone]

## 2016-11-19 DIAGNOSIS — G8222 Paraplegia, incomplete: Secondary | ICD-10-CM | POA: Diagnosis not present

## 2016-11-22 LAB — FUNGAL ORGANISM REFLEX

## 2016-11-22 LAB — FUNGUS CULTURE WITH STAIN

## 2016-11-22 LAB — BASIC METABOLIC PANEL
BUN: 13 mg/dL (ref 4–21)
Creatinine: 0.7 mg/dL (ref 0.6–1.3)
GLUCOSE: 99 mg/dL
Potassium: 4.2 mmol/L (ref 3.4–5.3)
Sodium: 146 mmol/L (ref 137–147)

## 2016-11-22 LAB — FUNGUS CULTURE RESULT

## 2016-11-23 DIAGNOSIS — G8222 Paraplegia, incomplete: Secondary | ICD-10-CM | POA: Diagnosis not present

## 2016-11-25 ENCOUNTER — Non-Acute Institutional Stay (SKILLED_NURSING_FACILITY): Payer: Medicare HMO | Admitting: Family

## 2016-11-25 ENCOUNTER — Encounter: Payer: Self-pay | Admitting: Family

## 2016-11-25 DIAGNOSIS — M4624 Osteomyelitis of vertebra, thoracic region: Secondary | ICD-10-CM | POA: Diagnosis not present

## 2016-11-25 DIAGNOSIS — B372 Candidiasis of skin and nail: Secondary | ICD-10-CM | POA: Diagnosis not present

## 2016-11-25 DIAGNOSIS — E785 Hyperlipidemia, unspecified: Secondary | ICD-10-CM | POA: Diagnosis not present

## 2016-11-25 DIAGNOSIS — E0800 Diabetes mellitus due to underlying condition with hyperosmolarity without nonketotic hyperglycemic-hyperosmolar coma (NKHHC): Secondary | ICD-10-CM

## 2016-11-25 DIAGNOSIS — E1169 Type 2 diabetes mellitus with other specified complication: Secondary | ICD-10-CM

## 2016-11-26 ENCOUNTER — Other Ambulatory Visit: Payer: Self-pay | Admitting: Licensed Clinical Social Worker

## 2016-11-26 NOTE — Patient Outreach (Signed)
[facility]) Care Management  11/26/2016  Charles Parker Dec 27, 1945 [id number]  Assessment- CSW completed call to [facility] but was unable to reach discharge planner successfully in order to gain discharge updates. CSW left a HIPPA compliant voice message for both discharge planners requesting return call.  Plan-CSW will await for return call or complete an additional outreach within one week.  [name], BSW, MSW, [email or web address] Phone: [phone] Fax: [phone]

## 2016-11-26 NOTE — Progress Notes (Signed)
Location:  [facility] Room Number: 106-P Place of Service:  SNF (31) Provider:  Dinah Ngetich FNP-C  Thomas Jones, MD  Patient Care Team: Thomas L Jones, MD as PCP - General (Internal Medicine) Matherly Schooler, MD as Consulting Physician (Gastroenterology) Brooke L Joyce, LCSW as [facility] Care Management (Licensed Clinical Social Worker)  Extended Emergency Contact Information Primary Emergency Contact: Bozzi,Anthony Address: [address]          [place], [place] [address] [place] Home Phone: [phone] Mobile Phone: [phone] Relation: Brother Secondary Emergency Contact: Brabson,Stahle  [place] Mobile Phone: [phone] Relation: Brother  Code Status:  Full Code  Goals of care: Advanced Directive information Advanced Directives 10/29/2016  Does Patient Have a Medical Advance Directive? Yes  Type of Advance Directive -  Does patient want to make changes to medical advance directive? No - Patient declined  Copy of Healthcare Power of Attorney in Chart? -  Would patient like information on creating a medical advance directive? -  Pre-existing out of facility DNR order (yellow form or pink MOST form) -     Chief Complaint  Patient presents with  . Medical Management of Chronic Issues    Routine Visit    HPI:  Pt is a 71 y.o. male seen today at [facility]  for medical management of chronic issues. He has a medical history of Type 2 DM, DVT, Dyslipidemia, BPH among other conditions. He is seen in his room today. He is status post thoracic decompression, discectomy, lateral arthrodesis and fixation.He is currently on Ceftriaxone 2 GM and Vancomycin 1000 mg  I.V via PICC line without any complication until 12/02/2016 for osteomyelitis of thoracic vertebra.His staples were removed by surg 11/15/2016 incision healing well.He continues to follow up with infectious disease seen  last on 11/16/2016 next appointment 12/14/2016.He continues to work with Therapy.He states pain under control with current regimen rates 4/10 on scale.He wears his back brace whenever he is out of bed on wheelchair.He complains of itching on groin area and inner thighs.He was given Benadryl overnight by on call provider.He also states would like some extra meal portion states current meal too little for him.Facility dietary director in to see patient.He denies any fever, chills, cough or signs of urinary infections. His CBG's log ranging in the 90's -200's with 300's especially in the PM.    Past Medical History:  Diagnosis Date  . Clotting disorder (HCC)    DVT  . Diabetes mellitus    type II  . Diverticulosis   . DVT (deep venous thrombosis) (HCC)   . Glaucoma    no eye drops   . Hepatic cyst   . Hypercholesterolemia   . Hypertension   . Myocardial infarction    very mild long ago   . Renal cyst   . Sleep apnea   . Tubulovillous adenoma    Past Surgical History:  Procedure Laterality Date  . BLADDER NECK RECONSTRUCTION  02/03/2012   Procedure: BLADDER NECK REPAIR;  Surgeon: Steven C. Gross, MD;  Location: [facility] ORS;  Service: General;;  . COLONOSCOPY  02/2013   polyp removal(mult.)  . KNEE SURGERY  2004   left  . POLYPECTOMY    . PROCTOSCOPY  02/03/2012   Procedure: PROCTOSCOPY;  Surgeon: Steven C. Gross, MD;  Location: [facility];  Service: General;;  . STOMACH SURGERY  02/2012  . TEE WITHOUT CARDIOVERSION N/A 10/05/2016   Procedure:   TRANSESOPHAGEAL ECHOCARDIOGRAM (TEE);  Surgeon: Sanda Klein, MD;  [facility];  Service: Cardiovascular;  Laterality: N/A;    Allergies  Allergen Reactions  . Metformin And Related Diarrhea  . Codeine Rash    All over the body    Allergies as of 11/25/2016      Reactions   Metformin And Related Diarrhea   Codeine Rash   All over the body      Medication List       Accurate as of 11/25/16 11:59 PM. Always use your most recent med list.           bisacodyl 10 MG suppository Commonly known as:  DULCOLAX Place 10 mg rectally daily as needed for moderate constipation.   bisacodyl 5 MG EC tablet Commonly known as:  DULCOLAX Take 1 tablet (5 mg total) by mouth daily as needed for moderate constipation.   cefTRIAXone IVPB Commonly known as:  ROCEPHIN Inject 2 g into the vein daily.   cephALEXin 500 MG capsule Commonly known as:  KEFLEX Take 500 mg by mouth 4 (four) times daily.   gabapentin 300 MG capsule Commonly known as:  NEURONTIN Take 300 mg by mouth 2 (two) times daily.   HYDROcodone-acetaminophen 5-325 MG tablet Commonly known as:  NORCO/VICODIN Take 1 tablet by mouth every 6 (six) hours as needed for moderate pain.   insulin glargine 100 UNIT/ML injection Commonly known as:  LANTUS Inject 25 Units into the skin at bedtime.   Insulin Pen Needle 31G X 5 MM Misc Administer lantus once nightly   LIDOCARE ARM/NECK/LEG 4 % Ptch Generic drug:  Lidocaine Apply topically every 12 (twelve) hours. Apply 2 patches to left knee   methocarbamol 500 MG tablet Commonly known as:  ROBAXIN Take 1 tablet (500 mg total) by mouth every 8 (eight) hours as needed for muscle spasms.   multivitamin with minerals Tabs tablet Take 1 tablet by mouth daily.   nitroGLYCERIN 0.4 MG SL tablet Commonly known as:  NITROSTAT Place 1 tablet (0.4 mg total) under the tongue every 5 (five) minutes as needed for chest pain.   oxybutynin 5 MG tablet Commonly known as:  DITROPAN Take 7.5 mg by mouth daily.   polyethylene glycol packet Commonly known as:  MIRALAX / GLYCOLAX Take 17 g by mouth 2 (two) times daily.   rivaroxaban 20 MG Tabs tablet Commonly known as:  XARELTO Take 1 tablet (20 mg total) by mouth daily with supper.   simvastatin 40 MG tablet Commonly known as:  ZOCOR TAKE 1 TABLET(40 MG) BY MOUTH EVERY EVENING   tamsulosin 0.4 MG Caps capsule Commonly known as:  FLOMAX Take 0.4 mg by mouth daily after  breakfast.   vancomycin IVPB Inject into the vein every 12 (twelve) hours. Give 1 gram/150 mL. Stop date 12/02/16   zolpidem 5 MG tablet Commonly known as:  AMBIEN Take 1 tablet (5 mg total) by mouth at bedtime as needed for sleep.       Review of Systems  Constitutional: Negative for activity change, appetite change, chills, fatigue and fever.  HENT: Negative for congestion, rhinorrhea, sinus pain, sinus pressure, sneezing and sore throat.   Eyes: Negative.   Respiratory: Negative for cough, chest tightness, shortness of breath and wheezing.   Cardiovascular: Negative for palpitations and leg swelling.       Chest pain per HPI   Gastrointestinal: Negative for abdominal distention, abdominal pain, constipation, diarrhea, nausea and vomiting.  Endocrine: Negative.   Genitourinary: Negative for dysuria,  flank pain, frequency and urgency.  Musculoskeletal: Positive for back pain and gait problem.  Skin: Negative for color change, pallor and rash.  Neurological: Negative for dizziness, seizures, syncope, light-headedness and headaches.       Leg weakness   Hematological: Does not bruise/bleed easily.  Psychiatric/Behavioral: Negative for agitation, confusion, hallucinations and sleep disturbance. The patient is not nervous/anxious.     Immunization History  Administered Date(s) Administered  . Influenza,inj,Quad PF,36+ Mos 06/20/2013, 07/10/2014  . Influenza-Unspecified 06/05/2015, 07/26/2016  . PPD Test 10/29/2016  . Pneumococcal Conjugate-13 06/20/2013, 07/23/2014  . Pneumococcal Polysaccharide-23 06/19/2015  . Tdap 06/20/2013   Pertinent  Health Maintenance Due  Topic Date Due  . OPHTHALMOLOGY EXAM  09/15/2016  . FOOT EXAM  11/10/2016  . URINE MICROALBUMIN  11/10/2016  . HEMOGLOBIN A1C  04/20/2017  . COLONOSCOPY  07/14/2026  . INFLUENZA VACCINE  Completed  . PNA vac Low Risk Adult  Completed   Fall Risk  10/08/2016 09/22/2016 09/02/2016 08/24/2016 01/01/2016  Falls in the  past year? No No Yes No No  Number falls in past yr: - - 1 - -  Risk for fall due to : - - Impaired balance/gait;Impaired mobility;Impaired vision;Medication side effect Impaired vision;Medication side effect -  Follow up - - Follow up appointment - -    Vitals:   11/25/16 1609  BP: (!) 141/84  Pulse: 61  Resp: 20  Temp: 97.5 F (36.4 C)  TempSrc: Oral  SpO2: 96%  Weight: 179 lb 12.8 oz (81.6 kg)  Height: 6' (1.829 m)   Body mass index is 24.39 kg/m. Physical Exam  Constitutional: He is oriented to person, place, and time. He appears well-developed and well-nourished. No distress.  HENT:  Head: Normocephalic.  Mouth/Throat: Oropharynx is clear and moist. No oropharyngeal exudate.  Eyes: Conjunctivae and EOM are normal. Pupils are equal, round, and reactive to light. Right eye exhibits no discharge. Left eye exhibits no discharge. No scleral icterus.  Neck: Normal range of motion. No JVD present. No thyromegaly present.  Cardiovascular: Normal rate, regular rhythm, normal heart sounds and intact distal pulses.  Exam reveals no gallop and no friction rub.   No murmur heard. Pulmonary/Chest: Effort normal and breath sounds normal. No respiratory distress. He has no wheezes. He has no rales.  Abdominal: Soft. Bowel sounds are normal. He exhibits no distension. There is no tenderness. There is no rebound and no guarding.  Musculoskeletal: He exhibits no edema, tenderness or deformity.  Moves x 4 extremities. Decreased strength to left leg.  Lymphadenopathy:    He has no cervical adenopathy.  Neurological: He is oriented to person, place, and time.  Skin: Skin is warm and dry. No rash noted. No erythema. No pallor.  Upper back surgical incision dry, clean and intact. Surrounding skin tissue without any signs of infection.     Psychiatric: He has a normal mood and affect.    Labs reviewed:  Recent Labs  08/30/16 1609  09/28/16 0516  10/25/16 0216 10/26/16 0733  10/27/16 0315 11/01/16 11/08/16 11/22/16  NA 130*  < > 138  < > 136 138 140 143 137 146  K 3.9  < > 4.2  < > 3.9 3.5 3.5 4.1 3.8 4.2  CL 94*  < > 108  < > 103 104 102  --   --   --   CO2 23  < > 24  < > 25 25 27  --   --   --   GLUCOSE   320*  < > 98  < > 160* 104* 153*  --   --   --   BUN 13  < > 10  < > _0 CREATININE 0.96  < > 0.81  < > 0.72 0.68 0.63 0.6 0.6 0.7  CALCIUM 8.8  9.1  < > 8.7*  < > 8.4* 8.6* 8.8*  --   --   --   MG 1.9  --  1.6*  --  1.7  --   --   --   --   --   PHOS 4.2  --   --   --   --   --   --   --   --   --   < > = values in this interval not displayed.  Recent Labs  09/26/16 1835 09/28/16 0516 10/11/16 10/21/16 1258  AST 32 _1 ALT 21 15* 21 16*  ALKPHOS 74 58 71 56  BILITOT 0.8 0.2*  --  0.3  PROT 8.5* 6.8  --  7.4  ALBUMIN 2.9* 2.3*  --  2.5*    Recent Labs  09/28/16 0516  10/07/16 0734  10/21/16 1258  10/25/16 0216 10/26/16 0733 10/27/16 0315 11/01/16 11/08/16  WBC 9.7  < > 7.0  < > 6.5  < > 12.2* 9.5 8.0 6.1 5.3  NEUTROABS 7.1  --  4.9  --  4.6  --   --   --   --   --   --   HGB 10.1*  < > 10.5*  < > 10.0*  < > 8.9* 8.5* 8.6* 9.6* 9.6*  HCT 31.6*  < > 33.7*  < > 32.1*  < > 28.1* 27.1* 26.8* 30* 30*  MCV 88.8  < > 89.6  --  88.4  < > 86.7 86.3 85.4  --   --   PLT 240  < > 327  < > 236  < > 210 249 272 386 232  < > = values in this interval not displayed. Lab Results  Component Value Date   TSH 0.67 09/08/2016   Lab Results  Component Value Date   HGBA1C 7.3 (H) 10/21/2016   Lab Results  Component Value Date   CHOL 195 11/11/2015   HDL 43.30 11/11/2015   LDLCALC 151 (H) 06/18/2015   LDLDIRECT 118.0 11/11/2015   TRIG 210.0 (H) 11/11/2015   CHOLHDL 4 11/11/2015   Assessment/Plan Candidiasis  Nystatin 100,000 units cream apply to groin area and thighs for itching x 10 days.   Type 2 DM CBG's ranging in the 90's-200's with occ 300's in PM.continue on Lantus will add humalog inject  5-10 units per SSI.      Osteomyelitis of thoracic  Afebrile.Incision healed. Continue on Ceftriaxone 2 GM and Vancomycin 1000 mg  I.V via PICC line without any complication until 11/10/2534.continue to follow up with ID. Continue with Therapy. Continue to monitor CBC, BMP, ESR, CRP and vacomycin trough per ID.     Dyslipidemia   continue on simvastatin. Monitor lipid panel.    Family/ staff Communication: Reviewed plan of care with patient and facility Nurse supervisor.   Labs/tests ordered: None

## 2016-11-29 ENCOUNTER — Other Ambulatory Visit: Payer: Self-pay | Admitting: Licensed Clinical Social Worker

## 2016-11-29 DIAGNOSIS — G8222 Paraplegia, incomplete: Secondary | ICD-10-CM | POA: Diagnosis not present

## 2016-11-29 NOTE — Patient Outreach (Signed)
[place] [facility]) Care Management  11/29/2016  Charles Parker 11/13/1945 [id number]   Assessment- CSW received return call from SNF social worker from [facility]. She reports that they did have a care team meeting and that his antibiotics for his osteomyelitis will end on 12/02/16 and that his Medicare coverage will end on that day as well. SNF discharge planner stated that starting on 12/03/16 the family has been considering having patient stay there at facility long term due to patient still having a lot of difficulty still with transferring and ambulating.   Plan-CSW will follow up with family within 1-2 weeks and continue to provide social work assistance.  [name], BSW, MSW, [email or web address] Phone: [phone] Fax: [phone]

## 2016-12-01 ENCOUNTER — Other Ambulatory Visit: Payer: Self-pay | Admitting: Licensed Clinical Social Worker

## 2016-12-01 DIAGNOSIS — G8222 Paraplegia, incomplete: Secondary | ICD-10-CM | POA: Diagnosis not present

## 2016-12-01 NOTE — Patient Outreach (Signed)
[place] [facility]) Care Management  12/01/2016  JERIAH MITTON Mar 02, 1946 [id number]   Assessment- CSW completed call to patient's brother and he answered. He reports that they had a care team meeting yesterday with family and that the family is hopeful that insurance will continue to pay for patient to stay at SNF for a little longer even after he stops his IV medications because they are not fully comfortable with him returning home just yet. However, he is aware that insurance will give a 48 hours expected discharge notice and family is prepared for this. Patient's brother reports that they have discussed patient's need for a walker when he discharges and that facility is willing to set up transportation for patient to come back to SNF in order to have physical therapy which is something the family prefers over [facility]. CSW will follow up within one week.  Plan-CSW will continue to follow case and await for SNF discharge.  [name], BSW, MSW, [email or web address] Phone: [phone] Fax: [phone]

## 2016-12-06 ENCOUNTER — Other Ambulatory Visit: Payer: Self-pay | Admitting: Licensed Clinical Social Worker

## 2016-12-06 DIAGNOSIS — G8222 Paraplegia, incomplete: Secondary | ICD-10-CM | POA: Diagnosis not present

## 2016-12-06 NOTE — Patient Outreach (Signed)
[place] [facility]) Care Management  12/06/2016  Charles Parker November 02, 1945 [id number]   Assessment- CSW completed call to [facility] and was informed that they did not see patient on the list for current patients that were at facility.  CSW completed call to patient's brother Charles Parker and he answered. He confirms that patient IS still at the facility and that they are hopeful that patient's insurance will continue to pay for him to be there. He reports no further updates at this time but states that patient is in good spirits and is working hard during physical therapy.  Plan-CSW will follow up within one week. CSW will continue to provide social work assistance and support.  Charles Parker, BSW, MSW, [email or web address] Phone: [phone] Fax: [phone]

## 2016-12-09 DIAGNOSIS — G8222 Paraplegia, incomplete: Secondary | ICD-10-CM | POA: Diagnosis not present

## 2016-12-10 ENCOUNTER — Encounter: Payer: Self-pay | Admitting: Family

## 2016-12-10 ENCOUNTER — Non-Acute Institutional Stay (SKILLED_NURSING_FACILITY): Payer: Medicare HMO | Admitting: Family

## 2016-12-10 ENCOUNTER — Other Ambulatory Visit: Payer: Self-pay | Admitting: Licensed Clinical Social Worker

## 2016-12-10 DIAGNOSIS — M792 Neuralgia and neuritis, unspecified: Secondary | ICD-10-CM

## 2016-12-10 DIAGNOSIS — M4624 Osteomyelitis of vertebra, thoracic region: Secondary | ICD-10-CM | POA: Diagnosis not present

## 2016-12-10 DIAGNOSIS — R3916 Straining to void: Secondary | ICD-10-CM

## 2016-12-10 DIAGNOSIS — E785 Hyperlipidemia, unspecified: Secondary | ICD-10-CM

## 2016-12-10 DIAGNOSIS — E1169 Type 2 diabetes mellitus with other specified complication: Secondary | ICD-10-CM

## 2016-12-10 DIAGNOSIS — N401 Enlarged prostate with lower urinary tract symptoms: Secondary | ICD-10-CM

## 2016-12-10 DIAGNOSIS — G4700 Insomnia, unspecified: Secondary | ICD-10-CM | POA: Diagnosis not present

## 2016-12-10 DIAGNOSIS — M6281 Muscle weakness (generalized): Secondary | ICD-10-CM

## 2016-12-10 DIAGNOSIS — E0800 Diabetes mellitus due to underlying condition with hyperosmolarity without nonketotic hyperglycemic-hyperosmolar coma (NKHHC): Secondary | ICD-10-CM | POA: Diagnosis not present

## 2016-12-10 DIAGNOSIS — R2681 Unsteadiness on feet: Secondary | ICD-10-CM

## 2016-12-10 NOTE — Patient Outreach (Signed)
[place] [facility]) Care Management  12/10/2016  RUSTY VILLELLA [birth year] [id number]   Assessment- CSW completed call to [facility] discharge planner and spoke to to her. She confirms that patient will be discharging back home on 12/12/16. She states that he will discharge back home with [facility] (in the home and not at facility like family had hoped.) She reports that they has not completed order for [facility] but that she will request that patient have PT, OT and a home health aide. She reports that she will also be ordering patient a wheelchair. CSW questioned if she would be ordering a walker and she declined stating that patient's family reports that they can provide one to him.   CSW completed call to patient' brother. He reports that he is currently trying to appeal request to discharge.   Plan-CSW will follow up on 12/13/16. CSW will complete home visit once he returns home.   [name], BSW, MSW, [email or web address] Phone: [phone] Fax: [phone]

## 2016-12-10 NOTE — Progress Notes (Signed)
Location:  [place] Room Number: 106 Place of Service:  SNF [phone])  Provider: Marlowe Sax FNP-C   PCP: Scarlette Calico, MD Patient Care Team: Janith Lima, MD as PCP - General (Internal Medicine) Wilford Corner, MD as Consulting Physician (Gastroenterology) Greg Cutter, LCSW as [facility] (Licensed Clinical Social Worker)  Extended Emergency Contact Information Primary Emergency Contact: Trela,Anthony Address: [address]          [place], [place] [address] [place] Phone: [phone] Mobile Phone: [phone] Relation: Brother Secondary Emergency Contact: Hazeline Junker [place] Mobile Phone: [phone] Relation: Brother  Code Status:Full Code  Goals of care:  Advanced Directive information Advanced Directives 12/10/2016  Does Patient Have a Medical Advance Directive? No  Type of Advance Directive -  Does patient want to make changes to medical advance directive? -  Copy of [place] in Chart? -  Would patient like information on creating a medical advance directive? -  Pre-existing out of facility DNR order (yellow form or pink MOST form) -     Allergies  Allergen Reactions  . Metformin And Related Diarrhea  . Codeine Rash    All over the body    Chief Complaint  Patient presents with  . Discharge Note    Discharge home     HPI:  71 y.o. male seen in his room today at [facility] for discharge home. He was here for short term rehabilitation post hospital readmission from 10/21/2016-10/27/2016 with worsening weakness of both his lower legs. He was noted to have discitis and osteomyelitis with new fluid collection concerning for another epidural abscess. He underwent thoracic decompression, discectomy, lateral arthrodesis and fixation.He was seen by infectious disease team and continued on IV antibiotics.Of note, he was  previously here for short term rehabilitation post hospital admission from 09/26/2016-10/05/2016 with sepsis from Escherichia coli pyelonephritis, L4-L5 epidural abscess with osteomyelitis and was on IV antibiotic. He has a medical history of HTN,MI, Type 2 DM, Hyperlipidemia, DVT, BPH,glaucoma among other conditions. He is seen in his room today sitting on a wheelchair. He rates upper back pain surgical incision 4/10 on scale. He denies any acute issues this visit.   During his stay here in [place] he complained of lower chest pain described as " achy"  X 1 episode. He was treeated with nitro x 1 dose with relief. EKG showed sinus Tachy with nonspecific Twave abnormality.He was also treated for candidiasis with Nystatin cream with much improvement. His CBG's log were 90's-200's with occasional 300's Lantus was continued and Humalog inject add per SSI.His CBG's currently has improved.He completed his Ceftriaxone 2 Gm and Vancomycin 1000 mg I.V via PICC line on 12/02/2016.He will continue on Keflex 500 mg Tablet four times daily until 12/22/2016. He continues to follow up with infectious disease.He wears Turtle shell brace when out of the bed.   He has worked well with PT/OT now stable for discharge home.He will be discharged home with Home health PT/OT to continue with ROM, Exercise, Gait stability and muscle strengthening. He will also require a [place] Aid to assist with ADL's.He will require  DME FWW to allow him to maintain current level of independence with ADL's.He will also require a standard WC with Cushion, anti tippers, extended brake handles, removable elevating leg rests to enable him to complete ADL's where walker or cane can not be used. Home health services will be  arranged by facility social worker prior to discharge. Prescription medication will be written x 1 month then patient to follow up with PCP in 1-2 weeks. Facility staff report no new concerns.      Past Medical History:  Diagnosis Date  .  Clotting disorder (HCC)    DVT  . Diabetes mellitus    type II  . Diverticulosis   . DVT (deep venous thrombosis) ([place])   . Glaucoma    no eye drops   . Hepatic cyst   . Hypercholesterolemia   . Hypertension   . Myocardial infarction    very mild long ago   . Renal cyst   . Sleep apnea   . Tubulovillous adenoma     Past Surgical History:  Procedure Laterality Date  . BLADDER NECK RECONSTRUCTION  02/03/2012   Procedure: BLADDER NECK REPAIR;  Surgeon: Adin Hector, MD;  Location: [facility] ORS;  Service: General;;  . COLONOSCOPY  02/2013   polyp removal(mult.)  . KNEE SURGERY  2004   left  . POLYPECTOMY    . PROCTOSCOPY  02/03/2012   Procedure: PROCTOSCOPY;  Surgeon: Adin Hector, MD;  Location: [facility]S;  Service: General;;  . STOMACH SURGERY  02/2012  . TEE WITHOUT CARDIOVERSION N/A 10/05/2016   Procedure: TRANSESOPHAGEAL ECHOCARDIOGRAM (TEE);  Surgeon: Sanda Klein, MD;  Location: [facility] ENDOSCOPY;  Service: Cardiovascular;  Laterality: N/A;      reports that he has never smoked. He has never used smokeless tobacco. He reports that he drinks alcohol. He reports that he does not use drugs. Social History   Social History  . Marital status: Widowed    Spouse name: N/A  . Number of children: 2  . Years of education: 12   Occupational History  . retired     Financial controller   Social History Main Topics  . Smoking status: Never Smoker  . Smokeless tobacco: Never Used  . Alcohol use 0.0 oz/week     Comment: once every two months  . Drug use: No  . Sexual activity: Not Currently   Other Topics Concern  . Not on file   Social History Narrative   Patient lives at home alone.. Patient is retired. Patient has high school education.   Right handed.- Both   Caffeine- Coffee and soda     Allergies  Allergen Reactions  . Metformin And Related Diarrhea  . Codeine Rash    All over the body    Pertinent  Health Maintenance Due  Topic Date Due  . OPHTHALMOLOGY EXAM  09/15/2016    . FOOT EXAM  11/10/2016  . URINE MICROALBUMIN  11/10/2016  . HEMOGLOBIN A1C  04/20/2017  . COLONOSCOPY  07/14/2026  . INFLUENZA VACCINE  Completed  . PNA vac Low Risk Adult  Completed    Medications: Allergies as of 12/10/2016      Reactions   Metformin And Related Diarrhea   Codeine Rash   All over the body      Medication List       Accurate as of 12/10/16  1:35 PM. Always use your most recent med list.          bisacodyl 10 MG suppository Commonly known as:  DULCOLAX Place 10 mg rectally daily as needed for moderate constipation.   bisacodyl 5 MG EC tablet Commonly known as:  DULCOLAX Take 1 tablet (5 mg total) by mouth daily as needed for moderate constipation.   cephALEXin 500 MG capsule Commonly known as:  KEFLEX Take 500 mg by mouth 4 (four) times daily.   gabapentin 300 MG capsule Commonly known as:  NEURONTIN Take 300 mg by mouth 2 (two) times daily.   HUMALOG 100 UNIT/ML injection Generic drug:  insulin lispro Inject into the skin. Sliding scale 5-10 units sq three times daily with meals 150-250 5 units; 251-300 8 units; 301-350 10 units; >350 notify provider   HYDROcodone-acetaminophen 5-325 MG tablet Commonly known as:  NORCO/VICODIN Take 1 tablet by mouth every 6 (six) hours as needed for moderate pain.   insulin glargine 100 UNIT/ML injection Commonly known as:  LANTUS Inject 25 Units into the skin at bedtime.   Insulin Pen Needle 31G X 5 MM Misc Administer lantus once nightly   methocarbamol 500 MG tablet Commonly known as:  ROBAXIN Take 1 tablet (500 mg total) by mouth every 8 (eight) hours as needed for muscle spasms.   multivitamin with minerals Tabs tablet Take 1 tablet by mouth daily.   nitroGLYCERIN 0.4 MG SL tablet Commonly known as:  NITROSTAT Place 1 tablet (0.4 mg total) under the tongue every 5 (five) minutes as needed for chest pain.   oxybutynin 5 MG tablet Commonly known as:  DITROPAN Take 5 mg by mouth daily.    polyethylene glycol packet Commonly known as:  MIRALAX / GLYCOLAX Take 17 g by mouth 2 (two) times daily.   rivaroxaban 20 MG Tabs tablet Commonly known as:  XARELTO Take 1 tablet (20 mg total) by mouth daily with supper.   simvastatin 40 MG tablet Commonly known as:  ZOCOR TAKE 1 TABLET(40 MG) BY MOUTH EVERY EVENING   tamsulosin 0.4 MG Caps capsule Commonly known as:  FLOMAX Take 0.4 mg by mouth daily after breakfast.   zolpidem 5 MG tablet Commonly known as:  AMBIEN Take 1 tablet (5 mg total) by mouth at bedtime as needed for sleep.       Review of Systems  Constitutional: Negative for activity change, appetite change, chills, fatigue and fever.  HENT: Negative for congestion, rhinorrhea, sinus pain, sinus pressure, sneezing and sore throat.   Eyes: Negative.   Respiratory: Negative for cough, chest tightness, shortness of breath and wheezing.   Cardiovascular: Negative for chest pain, palpitations and leg swelling.  Gastrointestinal: Negative for abdominal distention, abdominal pain, constipation, diarrhea, nausea and vomiting.  Endocrine: Negative.   Genitourinary: Negative for dysuria, flank pain, frequency and urgency.  Musculoskeletal: Positive for back pain and gait problem.  Skin: Negative for color change, pallor and rash.  Neurological: Negative for dizziness, seizures, syncope, light-headedness and headaches.       Leg weakness   Hematological: Does not bruise/bleed easily.  Psychiatric/Behavioral: Negative for agitation, confusion, hallucinations and sleep disturbance. The patient is not nervous/anxious.     Vitals:   12/10/16 0904  BP: 118/81  Pulse: 79  Resp: 18  Temp: 97.7 F (36.5 C)  SpO2: 96%  Weight: 200 lb (90.7 kg)  Height: 6' (1.829 m)   Body mass index is 27.12 kg/m. Physical Exam  Constitutional: He is oriented to person, place, and time. He appears well-developed and well-nourished. No distress.  HENT:  Head: Normocephalic.   Mouth/Throat: Oropharynx is clear and moist. No oropharyngeal exudate.  Eyes: Conjunctivae and EOM are normal. Pupils are equal, round, and reactive to light. Right eye exhibits no discharge. Left eye exhibits no discharge. No scleral icterus.  Neck: Normal range of motion. No JVD present. No thyromegaly present.  Cardiovascular: Normal rate, regular rhythm, normal heart  sounds and intact distal pulses.  Exam reveals no gallop and no friction rub.   No murmur heard. Pulmonary/Chest: Effort normal and breath sounds normal. No respiratory distress. He has no wheezes. He has no rales.  Abdominal: Soft. Bowel sounds are normal. He exhibits no distension. There is no tenderness. There is no rebound and no guarding.  Musculoskeletal: He exhibits no edema, tenderness or deformity.  Unsteady gait.Moves x 4 extremities.Decreased strength to left leg.Turtle shell brace in place.   Lymphadenopathy:    He has no cervical adenopathy.  Neurological: He is oriented to person, place, and time.  Skin: Skin is warm and dry. No rash noted. No erythema. No pallor.  Upper back surgical incision healed dry, clean and intact. Surrounding skin tissue without any signs of infection.     Psychiatric: He has a normal mood and affect.    Labs reviewed: Basic Metabolic Panel:  Recent Labs  08/30/16 1609  09/28/16 0516  10/25/16 0216 10/26/16 0733 10/27/16 0315 11/01/16 11/08/16 11/22/16  NA 130*  < > 138  < > 136 138 140 143 137 146  K 3.9  < > 4.2  < > 3.9 3.5 3.5 4.1 3.8 4.2  CL 94*  < > 108  < > 103 104 102  --   --   --   CO2 23  < > 24  < > 25 25 27   --   --   --   GLUCOSE 320*  < > 98  < > 160* 104* 153*  --   --   --   BUN 13  < > 10  < > 12 10 10 11 15 13   CREATININE 0.96  < > 0.81  < > 0.72 0.68 0.63 0.6 0.6 0.7  CALCIUM 8.8  9.1  < > 8.7*  < > 8.4* 8.6* 8.8*  --   --   --   MG 1.9  --  1.6*  --  1.7  --   --   --   --   --   PHOS 4.2  --   --   --   --   --   --   --   --   --   < > = values  in this interval not displayed. Liver Function Tests:  Recent Labs  09/26/16 1835 09/28/16 0516 10/11/16 10/21/16 1258  AST 32 21 19 22   ALT 21 15* 21 16*  ALKPHOS 74 58 71 56  BILITOT 0.8 0.2*  --  0.3  PROT 8.5* 6.8  --  7.4  ALBUMIN 2.9* 2.3*  --  2.5*    Recent Labs  10/21/16 1258  LIPASE 15   CBC:  Recent Labs  09/28/16 0516  10/07/16 0734  10/21/16 1258  10/25/16 0216 10/26/16 0733 10/27/16 0315 11/01/16 11/08/16  WBC 9.7  < > 7.0  < > 6.5  < > 12.2* 9.5 8.0 6.1 5.3  NEUTROABS 7.1  --  4.9  --  4.6  --   --   --   --   --   --   HGB 10.1*  < > 10.5*  < > 10.0*  < > 8.9* 8.5* 8.6* 9.6* 9.6*  HCT 31.6*  < > 33.7*  < > 32.1*  < > 28.1* 27.1* 26.8* 30* 30*  MCV 88.8  < > 89.6  --  88.4  < > 86.7 86.3 85.4  --   --   PLT 240  < >  327  < > 236  < > 210 249 272 386 232  < > = values in this interval not displayed. Cardiac Enzymes:  Recent Labs  09/28/16 2108 09/29/16 0529 10/07/16 0734  TROPONINI 0.04* <0.03 <0.03    Recent Labs  10/27/16 0630 10/27/16 1133 10/27/16 1636  GLUCAP 144* 178* 215*   Assessment/Plan:   1. Unsteady gait Has worked well with PT/ OT. Will discharge home with  PT/OT to continue with ROM, Exercise, Gait stability and muscle strengthening. He will also require a [place] Aid to assist with ADL's.He will require DME: FWW to allow him to maintain current level of independence with ADL's.He will also require a standard WC with Cushion, anti tippers, extended brake handles, removable elevating leg rests to enable him to complete ADL's where walker or cane cannot be used.  2. Generalized muscle weakness Has improved with Therapy. Will discharge home with PT/OT to continue with Exercise and muscle strengthening.  3. Diabetes mellitus  CBG's ranging in 110's-200's. Continue on Lantus 25 units SQ at bedtime and Humalog per SSI. Follow up with PCP to monitor Hgb A1C  4. Dyslipidemia associated with type 2 diabetes mellitus  Continue on Zocor 40  mg Tablet. Monitor Lipid panel periodically.   5. Benign prostatic hyperplasia (BPH) Continue on oxybutynin.   6. Osteomyelitis of thoracic vertebra  Status post short term rehabilitation post hospital readmission from 10/21/2016-10/27/2016 with worsening weakness of both his lower legs. He was noted to have discitis and osteomyelitis with new fluid collection concerning for another epidural abscess. He underwent thoracic decompression, discectomy, lateral arthrodesis and fixation.He was seen by infectious disease team and continued on IV antibiotics.Of note, he was previously here for short term rehabilitation post hospital admission from 09/26/2016-10/05/2016 with sepsis from Escherichia coli pyelonephritis, L4-L5 epidural abscess with osteomyelitis and was on IV antibiotic. Has completed his Ceftriaxone 2 Gm and Vancomycin 1000 mg I.V via PICC line on 12/02/2016.He will continue on Keflex 500 mg Tablet four times daily until 12/22/2016.Follow up with infectious disease.continue to  wear Turtle shell brace when out of the bed.   7. Neuropathic pain Continue on Gabapentin 300 mg Tablet twice daily.   8. Insomnia Continue on Zolpidem.   Patient is being discharged with the following home health services:   - PT/OT for ROM, exercise, gait stability and muscle strengthening  - [place] Aid for ADL's assistance    Patient is being discharged with the following durable medical equipment:  - FWW to allow him to maintain current level of independence with ADL's.  - A  standard WC with Cushion, anti tippers, extended brake handles, removable elevating leg rests to enable him to complete ADL's where walker or cane can not be used.  Patient has been advised to f/u with their PCP in 1-2 weeks to for a transitions of care visit. Social services at their facility was responsible for arranging this appointment.Pt was provided with adequate prescriptions of noncontrolled medications to reach the scheduled  appointment .For controlled substances, a limited supply was provided as appropriate for the individual patient.  If the pt normally receives these medications from a pain clinic or has a contract with another physician, these medications should be received from that clinic or physician only).    Future labs/tests needed:  CBC/diff, BMP in 1-2 weeks with PCP

## 2016-12-13 ENCOUNTER — Other Ambulatory Visit: Payer: Self-pay

## 2016-12-13 ENCOUNTER — Other Ambulatory Visit: Payer: Self-pay | Admitting: Licensed Clinical Social Worker

## 2016-12-13 NOTE — Patient Outreach (Signed)
[name]) Care Management  12/13/2016   Charles Parker [birth year] [id number]  Subjective:  I got home yesterday, I miss them (peope at SNF) but I am glad to be home  Objective:  Telephonic encounter  Current Medications:  Current Outpatient Prescriptions  Medication Sig Dispense Refill  . bisacodyl (DULCOLAX) 10 MG suppository Place 10 mg rectally daily as needed for moderate constipation.    . bisacodyl (DULCOLAX) 5 MG EC tablet Take 1 tablet (5 mg total) by mouth daily as needed for moderate constipation. 30 tablet 0  . cephALEXin (KEFLEX) 500 MG capsule Take 500 mg by mouth 4 (four) times daily.    Marland Kitchen gabapentin (NEURONTIN) 300 MG capsule Take 300 mg by mouth 2 (two) times daily.     Marland Kitchen HYDROcodone-acetaminophen (NORCO/VICODIN) 5-325 MG tablet Take 1 tablet by mouth every 6 (six) hours as needed for moderate pain. 15 tablet 0  . insulin glargine (LANTUS) 100 UNIT/ML injection Inject 25 Units into the skin at bedtime.    . insulin lispro (HUMALOG) 100 UNIT/ML injection Inject into the skin. Sliding scale 5-10 units sq three times daily with meals 150-250 5 units; 251-300 8 units; 301-350 10 units; >350 notify provider    . Insulin Pen Needle 31G X 5 MM MISC Administer lantus once nightly 90 each 0  . methocarbamol (ROBAXIN) 500 MG tablet Take 1 tablet (500 mg total) by mouth every 8 (eight) hours as needed for muscle spasms.    . Multiple Vitamin (MULTIVITAMIN WITH MINERALS) TABS tablet Take 1 tablet by mouth daily.    . nitroGLYCERIN (NITROSTAT) 0.4 MG SL tablet Place 1 tablet (0.4 mg total) under the tongue every 5 (five) minutes as needed for chest pain. 50 tablet 3  . oxybutynin (DITROPAN) 5 MG tablet Take 5 mg by mouth daily.     . polyethylene glycol (MIRALAX / GLYCOLAX) packet Take 17 g by mouth 2 (two) times daily. 14 each 0  . rivaroxaban (XARELTO) 20 MG TABS tablet Take 1 tablet (20 mg total) by mouth daily with supper. 90 tablet 0  . simvastatin (ZOCOR)  40 MG tablet TAKE 1 TABLET(40 MG) BY MOUTH EVERY EVENING 90 tablet 3  . tamsulosin (FLOMAX) 0.4 MG CAPS capsule Take 0.4 mg by mouth daily after breakfast.     . zolpidem (AMBIEN) 5 MG tablet Take 1 tablet (5 mg total) by mouth at bedtime as needed for sleep. 30 tablet 0   No current facility-administered medications for this visit.     Functional Status:  In your present state of health, do you have any difficulty performing the following activities: 12/13/2016 10/22/2016  Hearing? N -  Vision? Y -  Difficulty concentrating or making decisions? Y -  Walking or climbing stairs? Y -  Dressing or bathing? Y -  Doing errands, shopping? [name]  Preparing Food and eating ? Y -  Using the Toilet? N -  In the past six months, have you accidently leaked urine? N -  Do you have problems with loss of bowel control? N -  Managing your Medications? Y -  Managing your Finances? Y -  Housekeeping or managing your Housekeeping? Y -  Some recent data might be hidden    Fall/Depression Screening: PHQ 2/9 Scores 12/13/2016 10/29/2016 10/08/2016 09/22/2016 08/24/2016 01/01/2016 08/18/2015  PHQ - 2 Score 0 0 0 0 0 0 0   [facility] CM Care Plan Problem One   Flowsheet Row Most Recent Value  Care Plan Problem  One  recently discarged from skilled nursing facility  Role Documenting the Problem One  Care Management [place] for Problem One  Active  [facility] Long Term Goal (31-90 days)  patient will have no acute care admissions in the next 31 related to diabetes  [facility] Long Term Goal Start Date  12/13/16  Interventions for Problem One Long Term Goal  Transition of care called made  [facility] CM Short Term Goal #1 (0-30 days)  In the next 21 days, patient will meet with [facility] [name] for diabetes education  [facility] CM Short Term Goal #1 Start Date  12/13/16  Interventions for Short Term Goal #1  initial assessment of needs during transition of care call    [facility] CM Care Plan Problem Two   Flowsheet Row Most Recent Value  Care  Plan Problem Two  patient has limted knowledge reated to community support to remain in home  Role Documenting the Problem Two  Care Management [place] for Problem Two  Active  [facility] CM Short Term Goal #1 (0-30 days)  In the next 21 days, patient will meet with [name] to assess needs for community care coordination  [facility] CM Short Term Goal #1 Start Date  12/13/16  Interventions for Short Term Goal #2   initila assessment     Fall Risk  12/13/2016 10/08/2016 09/22/2016 09/02/2016 08/24/2016  Falls in the past year? Yes No No Yes No  Number falls in past yr: 1 - - 1 -  Injury with Fall? No - - - -  Risk for fall due to : History of fall(s);Impaired mobility;Impaired vision;Medication side effect - - Impaired balance/gait;Impaired mobility;Impaired vision;Medication side effect Impaired vision;Medication side effect  Follow up Follow up appointment - - Follow up appointment -   Assessment:  Patient and his brother, Charles Parker, expressed confusion related to discharge instructions, PT/OT services. [name] assisted by obtaining and relaying information in regards.  [facility] to provide services, agency plans to complete intake visit this week. [name] explained the [facility] for [facility].   Patient and his brother agrees to referral to program since patient has been approved for [facility] Medicaid. (per patient's brother's report). Charles Parker advises patient has appointment with Charles Parker, [facility] LCSW this week.  [name] and patient agreed to home visit in the next 21 days for community care coordination.   Patient and [name] collaborated to formulate his case management goals.  Plan:  Home visit in the next 21 days

## 2016-12-13 NOTE — Patient Outreach (Signed)
  [facility]) Care Management  12/13/2016  Charles Parker 11-04-1945 [id number]  Assessment- CSW received incoming call from patient's brother [name]. He reports that patient discharged back home yesterday evening. He states that he did file an appeal but that it was denied and that the family is very frustrated as they feel patient was not ready to transition back home yet. CSW provided education on [employer] but family denied needing this service at this time. Family is interested in personal care services through [facility]. CSW scheduled home visit for 12/15/15 and informed family that Bonita will be orienting a new [facility] [name] and that she will also be there during home visit. Family is agreeable to this. Brother reports that they have not scheduled a PCP appointment. CSW encouraged him to do that today and he was agreeable to do so.  Plan-CSW will inform [facility] [name] Erenest Rasher that patient has returned home from SNF. CSW will complete home visit this week and assist with community resources.  [name], BSW, MSW, [email or web address] Phone: [phone] Fax: [phone]

## 2016-12-15 ENCOUNTER — Other Ambulatory Visit (INDEPENDENT_AMBULATORY_CARE_PROVIDER_SITE_OTHER): Payer: Medicare HMO

## 2016-12-15 ENCOUNTER — Other Ambulatory Visit: Payer: Self-pay | Admitting: Licensed Clinical Social Worker

## 2016-12-15 ENCOUNTER — Ambulatory Visit (INDEPENDENT_AMBULATORY_CARE_PROVIDER_SITE_OTHER): Payer: Medicare HMO | Admitting: Internal Medicine

## 2016-12-15 ENCOUNTER — Encounter: Payer: Self-pay | Admitting: Internal Medicine

## 2016-12-15 ENCOUNTER — Other Ambulatory Visit: Payer: Self-pay | Admitting: Internal Medicine

## 2016-12-15 VITALS — BP 136/80 | HR 94 | Temp 98.3°F | Wt 187.0 lb

## 2016-12-15 DIAGNOSIS — G4730 Sleep apnea, unspecified: Secondary | ICD-10-CM | POA: Diagnosis not present

## 2016-12-15 DIAGNOSIS — M4624 Osteomyelitis of vertebra, thoracic region: Secondary | ICD-10-CM | POA: Diagnosis not present

## 2016-12-15 DIAGNOSIS — H409 Unspecified glaucoma: Secondary | ICD-10-CM | POA: Diagnosis not present

## 2016-12-15 DIAGNOSIS — E785 Hyperlipidemia, unspecified: Secondary | ICD-10-CM

## 2016-12-15 DIAGNOSIS — E118 Type 2 diabetes mellitus with unspecified complications: Secondary | ICD-10-CM | POA: Diagnosis not present

## 2016-12-15 DIAGNOSIS — D539 Nutritional anemia, unspecified: Secondary | ICD-10-CM | POA: Insufficient documentation

## 2016-12-15 DIAGNOSIS — Z794 Long term (current) use of insulin: Secondary | ICD-10-CM | POA: Diagnosis not present

## 2016-12-15 DIAGNOSIS — R2689 Other abnormalities of gait and mobility: Secondary | ICD-10-CM | POA: Diagnosis not present

## 2016-12-15 DIAGNOSIS — I252 Old myocardial infarction: Secondary | ICD-10-CM | POA: Diagnosis not present

## 2016-12-15 DIAGNOSIS — E119 Type 2 diabetes mellitus without complications: Secondary | ICD-10-CM | POA: Diagnosis not present

## 2016-12-15 DIAGNOSIS — E611 Iron deficiency: Secondary | ICD-10-CM

## 2016-12-15 DIAGNOSIS — E1165 Type 2 diabetes mellitus with hyperglycemia: Secondary | ICD-10-CM | POA: Insufficient documentation

## 2016-12-15 DIAGNOSIS — I10 Essential (primary) hypertension: Secondary | ICD-10-CM | POA: Diagnosis not present

## 2016-12-15 DIAGNOSIS — M6281 Muscle weakness (generalized): Secondary | ICD-10-CM | POA: Diagnosis not present

## 2016-12-15 DIAGNOSIS — D689 Coagulation defect, unspecified: Secondary | ICD-10-CM | POA: Diagnosis not present

## 2016-12-15 LAB — LIPID PANEL
CHOLESTEROL: 161 mg/dL (ref 0–200)
HDL: 52.1 mg/dL (ref >39.00)
LDL Cholesterol: 87 mg/dL (ref 0–99)
NonHDL: 108.58
Total CHOL/HDL Ratio: 3
Triglycerides: 108 mg/dL (ref 0.0–149.0)
VLDL: 21.6 mg/dL (ref 0.0–40.0)

## 2016-12-15 LAB — CBC WITH DIFFERENTIAL/PLATELET
BASOS PCT: 0.5 % (ref 0.0–3.0)
Basophils Absolute: 0 10*3/uL (ref 0.0–0.1)
EOS PCT: 0.7 % (ref 0.0–5.0)
Eosinophils Absolute: 0 10*3/uL (ref 0.0–0.7)
HEMATOCRIT: 40.6 % (ref 39.0–52.0)
HEMOGLOBIN: 13.3 g/dL (ref 13.0–17.0)
LYMPHS PCT: 21.1 % (ref 12.0–46.0)
Lymphs Abs: 1.4 10*3/uL (ref 0.7–4.0)
MCHC: 32.8 g/dL (ref 30.0–36.0)
MCV: 87.7 fl (ref 78.0–100.0)
MONO ABS: 0.6 10*3/uL (ref 0.1–1.0)
Monocytes Relative: 9.9 % (ref 3.0–12.0)
NEUTROS PCT: 67.8 % (ref 43.0–77.0)
Neutro Abs: 4.4 10*3/uL (ref 1.4–7.7)
Platelets: 187 10*3/uL (ref 150.0–400.0)
RBC: 4.63 Mil/uL (ref 4.22–5.81)
RDW: 18 % — AB (ref 11.5–15.5)
WBC: 6.5 10*3/uL (ref 4.0–10.5)

## 2016-12-15 LAB — VITAMIN B12: VITAMIN B 12: 532 pg/mL (ref 211–911)

## 2016-12-15 LAB — RETICULOCYTES
ABS RETIC: 47000 {cells}/uL (ref 25000–90000)
RBC.: 4.7 MIL/uL (ref 4.20–5.80)
Retic Ct Pct: 1 %

## 2016-12-15 LAB — IBC PANEL
Iron: 28 ug/dL — ABNORMAL LOW (ref 42–165)
SATURATION RATIOS: 7 % — AB (ref 20.0–50.0)
Transferrin: 287 mg/dL (ref 212.0–360.0)

## 2016-12-15 LAB — FOLATE

## 2016-12-15 LAB — FERRITIN: Ferritin: 642.2 ng/mL — ABNORMAL HIGH (ref 22.0–322.0)

## 2016-12-15 MED ORDER — INSULIN ASPART 100 UNIT/ML FLEXPEN: 10.0000 [IU] | PEN_INJECTOR | Freq: Three times a day (TID) | SUBCUTANEOUS | 11 refills | Status: DC

## 2016-12-15 NOTE — Patient Instructions (Signed)
Anemia, Nonspecific Anemia is a condition in which the concentration of red blood cells or hemoglobin in the blood is below normal. Hemoglobin is a substance in red blood cells that carries oxygen to the tissues of the body. Anemia results in not enough oxygen reaching these tissues. What are the causes? Common causes of anemia include:  Excessive bleeding. Bleeding may be internal or external. This includes excessive bleeding from periods (in women) or from the intestine.  Poor nutrition.  Chronic kidney, thyroid, and liver disease.  Bone marrow disorders that decrease red blood cell production.  Cancer and treatments for cancer.  HIV, AIDS, and their treatments.  Spleen problems that increase red blood cell destruction.  Blood disorders.  Excess destruction of red blood cells due to infection, medicines, and autoimmune disorders. What are the signs or symptoms?  Minor weakness.  Dizziness.  Headache.  Palpitations.  Shortness of breath, especially with exercise.  Paleness.  Cold sensitivity.  Indigestion.  Nausea.  Difficulty sleeping.  Difficulty concentrating. Symptoms may occur suddenly or they may develop slowly. How is this diagnosed? Additional blood tests are often needed. These help your health care provider determine the best treatment. Your health care provider will check your stool for blood and look for other causes of blood loss. How is this treated? Treatment varies depending on the cause of the anemia. Treatment can include:  Supplements of iron, vitamin B12, or folic acid.  Hormone medicines.  A blood transfusion. This may be needed if blood loss is severe.  Hospitalization. This may be needed if there is significant continual blood loss.  Dietary changes.  Spleen removal. Follow these instructions at home: Keep all follow-up appointments. It often takes many weeks to correct anemia, and having your health care provider check on your  condition and your response to treatment is very important. Get help right away if:  You develop extreme weakness, shortness of breath, or chest pain.  You become dizzy or have trouble concentrating.  You develop heavy vaginal bleeding.  You develop a rash.  You have bloody or black, tarry stools.  You faint.  You vomit up blood.  You vomit repeatedly.  You have abdominal pain.  You have a fever or persistent symptoms for more than 2-3 days.  You have a fever and your symptoms suddenly get worse.  You are dehydrated. This information is not intended to replace advice given to you by your health care provider. Make sure you discuss any questions you have with your health care provider. Document Released: 10/28/2004 Document Revised: 03/03/2016 Document Reviewed: 03/16/2013 Elsevier Interactive Patient Education  2017 Elsevier Inc.  

## 2016-12-15 NOTE — Progress Notes (Signed)
Subjective:  Patient ID: Charles Parker, male    DOB: [birth year]  Age: 71 y.o. MRN: [id number]  CC: Follow-up   HPI Charles Parker presents for Follow-up after being discharged from rehabilitation this week following a prolonged hospitalization for spinal abscess that required surgical intervention and ongoing antibiotic therapy. He complains that the numbness in his left leg is not better and is developing numbness in his right leg. He also has bilateral lower extremity weakness that he has improved some with PT. At home he will have 2 brothers that will help take care of him as well as Education officer, museum and home health care. He does live by himself. He is now confined to a week wheelchair. His insurance no longer covers Humalog so the prescription must be changed to NovoLog.  Outpatient Medications Prior to Visit  Medication Sig Dispense Refill  . bisacodyl (DULCOLAX) 10 MG suppository Place 10 mg rectally daily as needed for moderate constipation.    . bisacodyl (DULCOLAX) 5 MG EC tablet Take 1 tablet (5 mg total) by mouth daily as needed for moderate constipation. 30 tablet 0  . cephALEXin (KEFLEX) 500 MG capsule Take 500 mg by mouth 4 (four) times daily.    Marland Kitchen gabapentin (NEURONTIN) 300 MG capsule Take 300 mg by mouth 2 (two) times daily.     . insulin glargine (LANTUS) 100 UNIT/ML injection Inject 25 Units into the skin at bedtime.    . Insulin Pen Needle 31G X 5 MM MISC Administer lantus once nightly 90 each 0  . methocarbamol (ROBAXIN) 500 MG tablet Take 1 tablet (500 mg total) by mouth every 8 (eight) hours as needed for muscle spasms.    . Multiple Vitamin (MULTIVITAMIN WITH MINERALS) TABS tablet Take 1 tablet by mouth daily.    . nitroGLYCERIN (NITROSTAT) 0.4 MG SL tablet Place 1 tablet (0.4 mg total) under the tongue every 5 (five) minutes as needed for chest pain. 50 tablet 3  . oxybutynin (DITROPAN) 5 MG tablet Take 5 mg by mouth daily.     . polyethylene glycol (MIRALAX /  GLYCOLAX) packet Take 17 g by mouth 2 (two) times daily. 14 each 0  . rivaroxaban (XARELTO) 20 MG TABS tablet Take 1 tablet (20 mg total) by mouth daily with supper. 90 tablet 0  . simvastatin (ZOCOR) 40 MG tablet TAKE 1 TABLET(40 MG) BY MOUTH EVERY EVENING 90 tablet 3  . tamsulosin (FLOMAX) 0.4 MG CAPS capsule Take 0.4 mg by mouth daily after breakfast.     . HYDROcodone-acetaminophen (NORCO/VICODIN) 5-325 MG tablet Take 1 tablet by mouth every 6 (six) hours as needed for moderate pain. 15 tablet 0  . insulin lispro (HUMALOG) 100 UNIT/ML injection Inject into the skin. Sliding scale 5-10 units sq three times daily with meals 150-250 5 units; 251-300 8 units; 301-350 10 units; >350 notify provider    . zolpidem (AMBIEN) 5 MG tablet Take 1 tablet (5 mg total) by mouth at bedtime as needed for sleep. 30 tablet 0   No facility-administered medications prior to visit.     ROS Review of Systems  Constitutional: Positive for fatigue. Negative for appetite change, chills, diaphoresis and unexpected weight change.  HENT: Negative.   Eyes: Negative for visual disturbance.  Respiratory: Negative for cough, shortness of breath and wheezing.   Cardiovascular: Negative for chest pain, palpitations and leg swelling.  Gastrointestinal: Negative for abdominal pain, blood in stool, constipation, diarrhea, nausea and vomiting.  Endocrine: Negative.  Negative for polydipsia,  polyphagia and polyuria.  Genitourinary: Negative.  Negative for difficulty urinating.  Musculoskeletal: Negative for back pain, joint swelling, myalgias and neck pain.  Skin: Negative.   Allergic/Immunologic: Negative.   Neurological: Positive for weakness and numbness. Negative for dizziness and tremors.  Hematological: Negative for adenopathy. Does not bruise/bleed easily.  Psychiatric/Behavioral: Negative.  Negative for dysphoric mood and sleep disturbance. The patient is not nervous/anxious.     Objective:  BP 136/80 (BP  Location: Left Arm, Patient Position: Sitting)   Pulse 94   Temp 98.3 F (36.8 C) (Oral)   Wt 187 lb (84.8 kg)   SpO2 96%   BMI 25.36 kg/m   BP Readings from Last 3 Encounters:  12/15/16 136/80  12/10/16 118/81  11/25/16 (!) 141/84    Wt Readings from Last 3 Encounters:  12/15/16 187 lb (84.8 kg)  12/10/16 200 lb (90.7 kg)  11/25/16 179 lb 12.8 oz (81.6 kg)    Physical Exam  Constitutional: He is oriented to person, place, and time.  Non-toxic appearance. He has a sickly appearance. He does not appear ill.  Wheel chair bound Back brace intact  HENT:  Mouth/Throat: Oropharynx is clear and moist. No oropharyngeal exudate.  Eyes: Conjunctivae are normal. Right eye exhibits no discharge. Left eye exhibits no discharge. No scleral icterus.  Neck: Normal range of motion. Neck supple. No JVD present. No tracheal deviation present. No thyromegaly present.  Cardiovascular: Normal rate, regular rhythm, normal heart sounds and intact distal pulses.  Exam reveals no gallop and no friction rub.   No murmur heard. Pulmonary/Chest: Effort normal and breath sounds normal. No stridor. No respiratory distress. He has no wheezes. He has no rales. He exhibits no tenderness.  Abdominal: Soft. Bowel sounds are normal. He exhibits no distension and no mass. There is no tenderness. There is no rebound and no guarding.  Musculoskeletal: Normal range of motion. He exhibits no edema, tenderness or deformity.  Lymphadenopathy:    He has no cervical adenopathy.  Neurological: He is oriented to person, place, and time. He displays atrophy. He displays no tremor. No cranial nerve deficit or sensory deficit. He exhibits abnormal muscle tone. He displays no seizure activity. Coordination and gait abnormal.  Reflex Scores:      Brachioradialis reflexes are 0 on the left side.      Patellar reflexes are 0 on the right side and 0 on the left side.      Achilles reflexes are 0 on the right side and 0 on the left  side. There is mild weakness and atrophy in BLE  Skin: Skin is warm and dry. No rash noted. He is not diaphoretic. No erythema. No pallor.  Vitals reviewed.   Lab Results  Component Value Date   WBC 6.5 12/15/2016   HGB 13.3 12/15/2016   HCT 40.6 12/15/2016   PLT 187.0 12/15/2016   GLUCOSE 153 (H) 10/27/2016   CHOL 161 12/15/2016   TRIG 108.0 12/15/2016   HDL 52.10 12/15/2016   LDLDIRECT 118.0 11/11/2015   LDLCALC 87 12/15/2016   ALT 16 (L) 10/21/2016   AST 22 10/21/2016   NA 146 11/22/2016   K 4.2 11/22/2016   CL 102 10/27/2016   CREATININE 0.7 11/22/2016   BUN 13 11/22/2016   CO2 27 10/27/2016   TSH 0.67 09/08/2016   PSA 1.49 08/17/2016   INR 1.37 09/26/2016   HGBA1C 7.3 (H) 10/21/2016   MICROALBUR <0.7 11/11/2015    No results found.  Assessment & Plan:  Charles Parker was seen today for follow-up.  Diagnoses and all orders for this visit:  Hyperlipidemia LDL goal <70- He has achieved his LDL goal is doing well on the statin. -     Lipid panel; Future  Deficiency anemia- the anemia is markedly improved but he does have iron deficiency -     Reticulocytes; Future -     CBC with Differential/Platelet; Future -     Folate; Future -     Ferritin; Future -     IBC panel; Future -     Vitamin B12; Future -     Iron Combinations (NIFEREX) TABS; Take 1 tablet by mouth daily.  Type 2 diabetes mellitus with complication, with long-term current use of insulin ([place])- his recent A1c was 7.3%, his blood sugars are adequately well controlled. -     insulin aspart (NOVOLOG FLEXPEN) 100 UNIT/ML FlexPen; Inject 10 Units into the skin 3 (three) times daily with meals.  Iron deficiency- will start iron replacement therapy -     Iron Combinations (NIFEREX) TABS; Take 1 tablet by mouth daily.   I have discontinued Charles Parker's HYDROcodone-acetaminophen, zolpidem, and insulin lispro. I am also having him start on insulin aspart and NIFEREX. Additionally, I am having him maintain his  rivaroxaban, Insulin Pen Needle, simvastatin, tamsulosin, multivitamin with minerals, methocarbamol, polyethylene glycol, bisacodyl, insulin glargine, oxybutynin, nitroGLYCERIN, bisacodyl, cephALEXin, and gabapentin.  Meds ordered this encounter  Medications  . insulin aspart (NOVOLOG FLEXPEN) 100 UNIT/ML FlexPen    Sig: Inject 10 Units into the skin 3 (three) times daily with meals.    Dispense:  15 mL    Refill:  11  . Iron Combinations (NIFEREX) TABS    Sig: Take 1 tablet by mouth daily.    Dispense:  90 tablet    Refill:  0     Follow-up: Return in about 2 months (around 02/14/2017).  Scarlette Calico, MD

## 2016-12-15 NOTE — Patient Outreach (Signed)
[facility]) Care Management  [facility] Social Work  12/15/2016  Charles Parker 10-Jun-1946 [id number]  Encounter Medications:  Outpatient Encounter Prescriptions as of 12/15/2016  Medication Sig  . bisacodyl (DULCOLAX) 10 MG suppository Place 10 mg rectally daily as needed for moderate constipation.  . bisacodyl (DULCOLAX) 5 MG EC tablet Take 1 tablet (5 mg total) by mouth daily as needed for moderate constipation.  . cephALEXin (KEFLEX) 500 MG capsule Take 500 mg by mouth 4 (four) times daily.  Marland Kitchen gabapentin (NEURONTIN) 300 MG capsule Take 300 mg by mouth 2 (two) times daily.   Marland Kitchen HYDROcodone-acetaminophen (NORCO/VICODIN) 5-325 MG tablet Take 1 tablet by mouth every 6 (six) hours as needed for moderate pain.  Marland Kitchen insulin glargine (LANTUS) 100 UNIT/ML injection Inject 25 Units into the skin at bedtime.  . insulin lispro (HUMALOG) 100 UNIT/ML injection Inject into the skin. Sliding scale 5-10 units sq three times daily with meals 150-250 5 units; 251-300 8 units; 301-350 10 units; >350 notify provider  . Insulin Pen Needle 31G X 5 MM MISC Administer lantus once nightly  . methocarbamol (ROBAXIN) 500 MG tablet Take 1 tablet (500 mg total) by mouth every 8 (eight) hours as needed for muscle spasms.  . Multiple Vitamin (MULTIVITAMIN WITH MINERALS) TABS tablet Take 1 tablet by mouth daily.  . nitroGLYCERIN (NITROSTAT) 0.4 MG SL tablet Place 1 tablet (0.4 mg total) under the tongue every 5 (five) minutes as needed for chest pain.  Marland Kitchen oxybutynin (DITROPAN) 5 MG tablet Take 5 mg by mouth daily.   . polyethylene glycol (MIRALAX / GLYCOLAX) packet Take 17 g by mouth 2 (two) times daily.  . rivaroxaban (XARELTO) 20 MG TABS tablet Take 1 tablet (20 mg total) by mouth daily with supper.  . simvastatin (ZOCOR) 40 MG tablet TAKE 1 TABLET(40 MG) BY MOUTH EVERY EVENING  . tamsulosin (FLOMAX) 0.4 MG CAPS capsule Take 0.4 mg by mouth daily after breakfast.   . zolpidem (AMBIEN) 5 MG tablet Take 1  tablet (5 mg total) by mouth at bedtime as needed for sleep.   No facility-administered encounter medications on file as of 12/15/2016.     Functional Status:  In your present state of health, do you have any difficulty performing the following activities: 12/13/2016 10/22/2016  Hearing? N -  Vision? Y -  Difficulty concentrating or making decisions? Y -  Walking or climbing stairs? Y -  Dressing or bathing? Y -  Doing errands, shopping? [name]  Preparing Food and eating ? Y -  Using the Toilet? N -  In the past six months, have you accidently leaked urine? N -  Do you have problems with loss of bowel control? N -  Managing your Medications? Y -  Managing your Finances? Y -  Housekeeping or managing your Housekeeping? Y -  Some recent data might be hidden    Fall/Depression Screening:  PHQ 2/9 Scores 12/15/2016 12/13/2016 10/29/2016 10/08/2016 09/22/2016 08/24/2016 01/01/2016  PHQ - 2 Score 0 0 0 0 0 0 0    Assessment: CSW completed routine home visit on 12/15/16 with patient and his two supportive brothers. Patient returned home from [facility] on 12/12/16 and family is very concerned about patient's safety in the home. Family met with [place] today before CSW's scheduled visit and they will start PT, OT and Nurse Aide services either 12/16/16 or 12/17/16. Patient denies being in any pain or discomfort at this time. He shares that he has  some difficulty with sleep. Patient states that his appetite is "very good."   Patient shares that he is "happy to be home" but has a few safety concerns and family is interested in gaining community resource information in regards to more care within the home. Family report that patient is not able to walk with walker without someone around. Family have been educated on [facility] services and they are agreeable to starting the enrollment process. CSW provided 2 copies of PCS referral form and completed certain parts of form. CSW educated family on how the  document should be completed, where it should be faxed and the next steps that will take place after completion. Patient has PCP follow up appointment today and family is agreeable to taking form to doctor to complete. Family will encourage PCP to fax completed form to both [facility]W and [facility]. Contact numbers was highlighted on form. CSW will follow up as needed.  CSW provided list of Senior Resources which include ACE, PACE, [place], [employer], [place], [employer], [employer], [employer] and [employer]. CSW provided education on each resource. Family is very interested in PACE but they do not wish to change their PCP. However, they will bring PACE brochure to PCP appointment today and discuss with PCP. Family is agreeable to [employer], [employer], [employer] and [facility] referral. Family is aware of each program's fee and wait list time. CSW will assist with SCAT application during next scheduled home visit. CSW will make referrals to [employer], [employer] and [facility] within one week. Patient and family are both familiar with the [facility] and patient went to the [facility] [place] [facility] there last summer with friends. Family very appreciative of resource information.   Plan: CSW will follow up with patient and family and make requested referrals within one week. CSW will route encounter to PCP.   [facility] CM Care Plan Problem One   Flowsheet Row Most Recent Value  Care Plan Problem One  SNF admission and lack of resources  Role Documenting the Problem One  Clinical Social Worker  Care Plan for Problem One  Active  THN Long Term Goal (31-90 days)  Patient will have a safe and stable discharging back home from SNF within 90 days per self report  THN Long Term Goal Start Date  10/08/16  Interventions for Problem One Long Term Goal  Patient is back home from SNF and CSW is working on establishing a safe home environment with needed resources.  THN CM Short  Term Goal #1 (0-30 days)  Patient will gain food stamps within 30 days as evidenced by checking his mail and informing CSW  [facility] CM Short Term Goal #1 Start Date  10/08/16  [facility] CM Short Term Goal #1 Met Date  11/17/16  Interventions for Short Term Goal #1  Goal met  THN CM Short Term Goal #2 (0-30 days)  Patient will attend all scheduled medical appointments within the next 30 days  THN CM Short Term Goal #2 Start Date  10/08/16  [facility] CM Short Term Goal #2 Met Date  11/17/16  Interventions for Short Term Goal #2  Goal successfully met.  THN CM Short Term Goal #3 (0-30 days)  Patient will create a safe plan for his transition back home within 30 days per family and patient report  THN CM Short Term Goal #3 Start Date  11/17/16  [facility] CM Short Term Goal #3 Met Date  12/15/16  Interventions for Short Tern Goal #3  Goal met.  THN CM Short Term Goal #4 (0-30 days)  Patient will be evaluated by PCS within 30 days per family report  THN CM Short Term Goal #4 Start Date  12/15/16  Interventions for Short Term Goal #4  CSW has completed and provided family with PCS form for them to take to PCP office to complete and fax to [facility]. CSW will monitor this entire process and has educated family on the various steps to gain this service.     [name], BSW, MSW, [email or web address] Phone: [phone] Fax: [phone]

## 2016-12-15 NOTE — Progress Notes (Signed)
Pre visit review using our clinic review tool, if applicable. No additional management support is needed unless otherwise documented below in the visit note. 

## 2016-12-15 NOTE — Patient Outreach (Signed)
[facility]) Care Management  12/15/2016  Charles Parker 1945-10-05 [id number]   Assessment- CSW will mail family a list of home care providers through [facility] so that family can take time to consider which agency they wish to choose.  Plan-CSW will send request to [place] Management Assistant with requested information.  [name], BSW, MSW, [email or web address] Phone: [phone] Fax: [phone]

## 2016-12-16 ENCOUNTER — Telehealth: Payer: Self-pay | Admitting: Internal Medicine

## 2016-12-16 ENCOUNTER — Other Ambulatory Visit: Payer: Self-pay | Admitting: Licensed Clinical Social Worker

## 2016-12-16 DIAGNOSIS — R2689 Other abnormalities of gait and mobility: Secondary | ICD-10-CM | POA: Diagnosis not present

## 2016-12-16 DIAGNOSIS — M6281 Muscle weakness (generalized): Secondary | ICD-10-CM | POA: Diagnosis not present

## 2016-12-16 DIAGNOSIS — E611 Iron deficiency: Secondary | ICD-10-CM | POA: Insufficient documentation

## 2016-12-16 DIAGNOSIS — E119 Type 2 diabetes mellitus without complications: Secondary | ICD-10-CM | POA: Diagnosis not present

## 2016-12-16 DIAGNOSIS — I10 Essential (primary) hypertension: Secondary | ICD-10-CM | POA: Diagnosis not present

## 2016-12-16 DIAGNOSIS — D689 Coagulation defect, unspecified: Secondary | ICD-10-CM | POA: Diagnosis not present

## 2016-12-16 DIAGNOSIS — M4624 Osteomyelitis of vertebra, thoracic region: Secondary | ICD-10-CM | POA: Diagnosis not present

## 2016-12-16 DIAGNOSIS — H409 Unspecified glaucoma: Secondary | ICD-10-CM | POA: Diagnosis not present

## 2016-12-16 DIAGNOSIS — G4730 Sleep apnea, unspecified: Secondary | ICD-10-CM | POA: Diagnosis not present

## 2016-12-16 DIAGNOSIS — I252 Old myocardial infarction: Secondary | ICD-10-CM | POA: Diagnosis not present

## 2016-12-16 MED ORDER — NIFEREX PO TABS: 1.0000 | ORAL_TABLET | Freq: Every day | ORAL | 0 refills | Status: DC

## 2016-12-16 NOTE — Patient Outreach (Signed)
[place]) Care Management  12/16/2016  Charles Parker Feb 28, 1946 [id number]   Assessment- CSW completed call to the [facility] and successfully completed referrals for both Mobile Meals and Senior Wheels for patient. CSW provided patient's brother's contact number in order for Mobile Meals social worker to schedule home assessment with ease.   Plan-CSW will make referral to [facility] within one week and continue to assist patient and family with social work needs.  [name], BSW, MSW, [email or web address] Phone: [phone] Fax: [phone]

## 2016-12-16 NOTE — Telephone Encounter (Signed)
Diane [phone]  [facility]  Need Verbals   OT 2 week 4

## 2016-12-16 NOTE — Patient Outreach (Signed)
[facility]) Care Management  12/16/2016  Charles Parker 1946/08/09 [id number]  Request received from [name], LCSW to mail patient personal care service provider list.   [name], [place] Management Assistant

## 2016-12-17 ENCOUNTER — Other Ambulatory Visit: Payer: Self-pay | Admitting: Licensed Clinical Social Worker

## 2016-12-17 DIAGNOSIS — H409 Unspecified glaucoma: Secondary | ICD-10-CM | POA: Diagnosis not present

## 2016-12-17 DIAGNOSIS — R2689 Other abnormalities of gait and mobility: Secondary | ICD-10-CM | POA: Diagnosis not present

## 2016-12-17 DIAGNOSIS — I10 Essential (primary) hypertension: Secondary | ICD-10-CM | POA: Diagnosis not present

## 2016-12-17 DIAGNOSIS — M4624 Osteomyelitis of vertebra, thoracic region: Secondary | ICD-10-CM | POA: Diagnosis not present

## 2016-12-17 DIAGNOSIS — G4730 Sleep apnea, unspecified: Secondary | ICD-10-CM | POA: Diagnosis not present

## 2016-12-17 DIAGNOSIS — I252 Old myocardial infarction: Secondary | ICD-10-CM | POA: Diagnosis not present

## 2016-12-17 DIAGNOSIS — M6281 Muscle weakness (generalized): Secondary | ICD-10-CM | POA: Diagnosis not present

## 2016-12-17 DIAGNOSIS — E119 Type 2 diabetes mellitus without complications: Secondary | ICD-10-CM | POA: Diagnosis not present

## 2016-12-17 DIAGNOSIS — D689 Coagulation defect, unspecified: Secondary | ICD-10-CM | POA: Diagnosis not present

## 2016-12-17 NOTE — Patient Outreach (Signed)
[place] [facility]) Care Management  12/17/2016  Charles Parker 12-17-1945 [id number]   Assessment- CSW successfully completed call to [facility] and was informed that the program is no longer accepting any referrals at this time. [facility] program is unsure as to how long the halt will take place but suggested calling back in a few months.  Plan-CSW will update family that [facility] referral was unable to be placed.  Charles Parker, BSW, MSW, [name] Phone: [phone] Fax: [phone]

## 2016-12-17 NOTE — Telephone Encounter (Signed)
Called Diane at [facility] and gave verbal orders for OT as requested.   Forwarding to PCP.

## 2016-12-20 ENCOUNTER — Other Ambulatory Visit: Payer: Self-pay | Admitting: Licensed Clinical Social Worker

## 2016-12-20 ENCOUNTER — Telehealth: Payer: Self-pay | Admitting: Internal Medicine

## 2016-12-20 ENCOUNTER — Other Ambulatory Visit: Payer: Self-pay | Admitting: Internal Medicine

## 2016-12-20 DIAGNOSIS — I252 Old myocardial infarction: Secondary | ICD-10-CM | POA: Diagnosis not present

## 2016-12-20 DIAGNOSIS — M25561 Pain in right knee: Secondary | ICD-10-CM | POA: Insufficient documentation

## 2016-12-20 DIAGNOSIS — M6281 Muscle weakness (generalized): Secondary | ICD-10-CM | POA: Diagnosis not present

## 2016-12-20 DIAGNOSIS — H409 Unspecified glaucoma: Secondary | ICD-10-CM | POA: Diagnosis not present

## 2016-12-20 DIAGNOSIS — L602 Onychogryphosis: Secondary | ICD-10-CM

## 2016-12-20 DIAGNOSIS — G4730 Sleep apnea, unspecified: Secondary | ICD-10-CM | POA: Diagnosis not present

## 2016-12-20 DIAGNOSIS — D689 Coagulation defect, unspecified: Secondary | ICD-10-CM | POA: Diagnosis not present

## 2016-12-20 DIAGNOSIS — M25562 Pain in left knee: Secondary | ICD-10-CM

## 2016-12-20 DIAGNOSIS — R2689 Other abnormalities of gait and mobility: Secondary | ICD-10-CM | POA: Diagnosis not present

## 2016-12-20 DIAGNOSIS — E119 Type 2 diabetes mellitus without complications: Secondary | ICD-10-CM | POA: Diagnosis not present

## 2016-12-20 DIAGNOSIS — M4624 Osteomyelitis of vertebra, thoracic region: Secondary | ICD-10-CM | POA: Diagnosis not present

## 2016-12-20 DIAGNOSIS — I10 Essential (primary) hypertension: Secondary | ICD-10-CM | POA: Diagnosis not present

## 2016-12-20 NOTE — Telephone Encounter (Signed)
Faxed on Friday.

## 2016-12-20 NOTE — Patient Outreach (Signed)
[facility] [facility]) Care Management  12/20/2016  Charles Parker 11-29-1945 [id number]   Assessment- CSW completed outreach call to patient's brother [name] and he answered. He reports that patient is showing improvements since he has started [facility]. He reported that patient is still staying with him at night and he is taking him back to his own residence in the mornings for therapy. He stated that patient is currently receiving OT now and that PT was completed earlier this morning. He reports that the CNA will come tomorrow to assist with bathing and will assist 2x per week. Patient's brother reports that they gave PCP the PCS form to complete. They also discussed the PACE program with PCP. CSW informed brother that the [facility] program is no longer accepting patient referrals at this time. Brother expressed understanding.  CSW completed call to PCP office to follow up on PCS form and spoke to staff member. CSW informed staff member that CSW was wanting to follow up and make sure form was completed and faxed to [facility]. Staff member agreed to send PCP's nurse this message.  Plan-CSW will await for updates in regards to [facility] application and will continue to provide social work assistance and support.  [name], BSW, MSW, [email or web address] Phone: [phone] Fax: [phone]

## 2016-12-20 NOTE — Telephone Encounter (Signed)
THN Jettie Booze [phone]  Calling to check on the status of the personal care services from [employer]

## 2016-12-20 NOTE — Patient Outreach (Signed)
[place] [facility]) Care Management  12/20/2016  Charles Parker [birth year] [id number]   Assessment- CSW received return message from PCP's nurse on 12/20/16 and was informed that the [facility] form was completed and faxed to [facility] on 12/17/16. CSW provided this update to patient's brother Charles Parker.  Plan-CSW will follow up with [facility] within one week.  [name], BSW, MSW, [email or web address] Phone: [phone] Fax: [phone]

## 2016-12-20 NOTE — Telephone Encounter (Signed)
Pt brother informed.

## 2016-12-20 NOTE — Telephone Encounter (Signed)
Left knee is swollen. He has been doing un occupational therapy on his knee. Needs to see if he needs an appointment. He's finger and toe nails also need to be cut and was wondering if there needs to be a referral for a pediatrist.

## 2016-12-20 NOTE — Telephone Encounter (Signed)
Referrals ordered.

## 2016-12-20 NOTE — Telephone Encounter (Signed)
Called [employer] and informed of same.

## 2016-12-21 ENCOUNTER — Telehealth: Payer: Self-pay | Admitting: Internal Medicine

## 2016-12-21 ENCOUNTER — Other Ambulatory Visit: Payer: Self-pay

## 2016-12-21 DIAGNOSIS — I10 Essential (primary) hypertension: Secondary | ICD-10-CM | POA: Diagnosis not present

## 2016-12-21 DIAGNOSIS — D689 Coagulation defect, unspecified: Secondary | ICD-10-CM | POA: Diagnosis not present

## 2016-12-21 DIAGNOSIS — R2689 Other abnormalities of gait and mobility: Secondary | ICD-10-CM | POA: Diagnosis not present

## 2016-12-21 DIAGNOSIS — H409 Unspecified glaucoma: Secondary | ICD-10-CM | POA: Diagnosis not present

## 2016-12-21 DIAGNOSIS — M4624 Osteomyelitis of vertebra, thoracic region: Secondary | ICD-10-CM | POA: Diagnosis not present

## 2016-12-21 DIAGNOSIS — M6281 Muscle weakness (generalized): Secondary | ICD-10-CM | POA: Diagnosis not present

## 2016-12-21 DIAGNOSIS — G4730 Sleep apnea, unspecified: Secondary | ICD-10-CM | POA: Diagnosis not present

## 2016-12-21 DIAGNOSIS — E119 Type 2 diabetes mellitus without complications: Secondary | ICD-10-CM | POA: Diagnosis not present

## 2016-12-21 DIAGNOSIS — I252 Old myocardial infarction: Secondary | ICD-10-CM | POA: Diagnosis not present

## 2016-12-21 NOTE — Telephone Encounter (Signed)
ICD 10 added and faxed back.

## 2016-12-21 NOTE — Telephone Encounter (Signed)
Olin Hauser the [facility] nurse called stating the application for [place] [employer] was missing the ICD10 code.  She states they faxed it back to [place] to get the code on it.  She is wanting to be sure you received it. If you have any questions for her, her # is [phone].

## 2016-12-22 ENCOUNTER — Telehealth: Payer: Self-pay | Admitting: Internal Medicine

## 2016-12-22 DIAGNOSIS — M4624 Osteomyelitis of vertebra, thoracic region: Secondary | ICD-10-CM | POA: Diagnosis not present

## 2016-12-22 DIAGNOSIS — I252 Old myocardial infarction: Secondary | ICD-10-CM | POA: Diagnosis not present

## 2016-12-22 DIAGNOSIS — G062 Extradural and subdural abscess, unspecified: Secondary | ICD-10-CM | POA: Diagnosis not present

## 2016-12-22 DIAGNOSIS — D689 Coagulation defect, unspecified: Secondary | ICD-10-CM | POA: Diagnosis not present

## 2016-12-22 DIAGNOSIS — M6281 Muscle weakness (generalized): Secondary | ICD-10-CM | POA: Diagnosis not present

## 2016-12-22 DIAGNOSIS — R2689 Other abnormalities of gait and mobility: Secondary | ICD-10-CM | POA: Diagnosis not present

## 2016-12-22 DIAGNOSIS — E119 Type 2 diabetes mellitus without complications: Secondary | ICD-10-CM | POA: Diagnosis not present

## 2016-12-22 DIAGNOSIS — Z981 Arthrodesis status: Secondary | ICD-10-CM | POA: Diagnosis not present

## 2016-12-22 DIAGNOSIS — I10 Essential (primary) hypertension: Secondary | ICD-10-CM | POA: Diagnosis not present

## 2016-12-22 DIAGNOSIS — E139 Other specified diabetes mellitus without complications: Secondary | ICD-10-CM | POA: Diagnosis not present

## 2016-12-22 DIAGNOSIS — H409 Unspecified glaucoma: Secondary | ICD-10-CM | POA: Diagnosis not present

## 2016-12-22 DIAGNOSIS — G4730 Sleep apnea, unspecified: Secondary | ICD-10-CM | POA: Diagnosis not present

## 2016-12-22 DIAGNOSIS — M4324 Fusion of spine, thoracic region: Secondary | ICD-10-CM | POA: Diagnosis not present

## 2016-12-22 NOTE — Telephone Encounter (Signed)
Left Diane detailed message with verbal okay for OT as requested.

## 2016-12-22 NOTE — Patient Outreach (Signed)
[facility]) Care Management   12/22/2016  GOPAL MALTER 08/23/1946 [id number]  Charles Parker is an 71 y.o. male  Subjective:  I am getting better all the time.  Objective:   ROS  Elderly gentleman, well dressed/groomed, with upper body brace intact.  Physical Exam  ROS  Encounter Medications:   Outpatient Encounter Prescriptions as of 12/21/2016  Medication Sig  . bisacodyl (DULCOLAX) 10 MG suppository Place 10 mg rectally daily as needed for moderate constipation.  . bisacodyl (DULCOLAX) 5 MG EC tablet Take 1 tablet (5 mg total) by mouth daily as needed for moderate constipation.  . cephALEXin (KEFLEX) 500 MG capsule Take 500 mg by mouth 4 (four) times daily.  Marland Kitchen gabapentin (NEURONTIN) 300 MG capsule Take 300 mg by mouth 2 (two) times daily.   . insulin aspart (NOVOLOG FLEXPEN) 100 UNIT/ML FlexPen Inject 10 Units into the skin 3 (three) times daily with meals.  . insulin glargine (LANTUS) 100 UNIT/ML injection Inject 25 Units into the skin at bedtime.  . Insulin Pen Needle 31G X 5 MM MISC Administer lantus once nightly  . Iron Combinations (NIFEREX) TABS Take 1 tablet by mouth daily.  . methocarbamol (ROBAXIN) 500 MG tablet Take 1 tablet (500 mg total) by mouth every 8 (eight) hours as needed for muscle spasms.  . Multiple Vitamin (MULTIVITAMIN WITH MINERALS) TABS tablet Take 1 tablet by mouth daily.  . nitroGLYCERIN (NITROSTAT) 0.4 MG SL tablet Place 1 tablet (0.4 mg total) under the tongue every 5 (five) minutes as needed for chest pain.  Marland Kitchen oxybutynin (DITROPAN) 5 MG tablet Take 5 mg by mouth daily.   . polyethylene glycol (MIRALAX / GLYCOLAX) packet Take 17 g by mouth 2 (two) times daily.  . rivaroxaban (XARELTO) 20 MG TABS tablet Take 1 tablet (20 mg total) by mouth daily with supper.  . simvastatin (ZOCOR) 40 MG tablet TAKE 1 TABLET(40 MG) BY MOUTH EVERY EVENING  . tamsulosin (FLOMAX) 0.4 MG CAPS capsule Take 0.4 mg by mouth daily after breakfast.     No facility-administered encounter medications on file as of 12/21/2016.     Functional Status:   In your present state of health, do you have any difficulty performing the following activities: 12/13/2016 10/22/2016  Hearing? N -  Vision? Y -  Difficulty concentrating or making decisions? Y -  Walking or climbing stairs? Y -  Dressing or bathing? Y -  Doing errands, shopping? [name]  Preparing Food and eating ? Y -  Using the Toilet? N -  In the past six months, have you accidently leaked urine? N -  Do you have problems with loss of bowel control? N -  Managing your Medications? Y -  Managing your Finances? Y -  Housekeeping or managing your Housekeeping? Y -  Some recent data might be hidden    Fall/Depression Screening:    PHQ 2/9 Scores 12/21/2016 12/15/2016 12/13/2016 10/29/2016 10/08/2016 09/22/2016 08/24/2016  PHQ - 2 Score 0 0 0 0 0 0 0      Assessment:   Home visit with patient and his two brothers. Patient agreed to referral to [facility], message left for intake coordinator. Patient and brothers educated on the referral process for the [facility]ACE Program. Patient reports continuing to receive therapies for PT/OT through [name]. Call made to [facility] to follow up with referral for [facility]. This [name] was advised the application was rejected due to ICD 10  codes being left off. Dr. Scarlette Calico, MD office called, advised of the application rejection, rationale for rejection.  Plan:  Telephone contact in the next 21 days for assessment of progression towards meetign his case management goals.

## 2016-12-22 NOTE — Telephone Encounter (Signed)
Verbal Order for OT:  3x a week for 3 week Start March 26th  [phone] - Diane

## 2016-12-23 ENCOUNTER — Other Ambulatory Visit: Payer: Self-pay

## 2016-12-23 ENCOUNTER — Encounter: Payer: Self-pay | Admitting: Internal Medicine

## 2016-12-23 ENCOUNTER — Other Ambulatory Visit: Payer: Self-pay | Admitting: Licensed Clinical Social Worker

## 2016-12-23 ENCOUNTER — Ambulatory Visit (INDEPENDENT_AMBULATORY_CARE_PROVIDER_SITE_OTHER): Payer: Medicare HMO | Admitting: Internal Medicine

## 2016-12-23 VITALS — BP 143/93 | HR 109 | Temp 98.5°F

## 2016-12-23 DIAGNOSIS — M4624 Osteomyelitis of vertebra, thoracic region: Secondary | ICD-10-CM

## 2016-12-23 DIAGNOSIS — Z5181 Encounter for therapeutic drug level monitoring: Secondary | ICD-10-CM

## 2016-12-23 DIAGNOSIS — G061 Intraspinal abscess and granuloma: Secondary | ICD-10-CM | POA: Diagnosis not present

## 2016-12-23 LAB — COMPLETE METABOLIC PANEL WITH GFR
ALT: 18 U/L (ref 9–46)
AST: 19 U/L (ref 10–35)
Albumin: 3.8 g/dL (ref 3.6–5.1)
Alkaline Phosphatase: 70 U/L (ref 40–115)
BUN: 17 mg/dL (ref 7–25)
CALCIUM: 9.3 mg/dL (ref 8.6–10.3)
CO2: 28 mmol/L (ref 20–31)
Chloride: 106 mmol/L (ref 98–110)
Creat: 0.88 mg/dL (ref 0.70–1.18)
GFR, Est African American: >89 mL/min (ref >=60)
GFR, Est Non African American: 87 mL/min (ref >=60)
Glucose, Bld: 159 mg/dL — ABNORMAL HIGH (ref 65–99)
POTASSIUM: 4.2 mmol/L (ref 3.5–5.3)
Sodium: 141 mmol/L (ref 135–146)
Total Bilirubin: 0.4 mg/dL (ref 0.2–1.2)
Total Protein: 7 g/dL (ref 6.1–8.1)

## 2016-12-23 MED ORDER — CEPHALEXIN 500 MG PO CAPS
500.0000 mg | ORAL_CAPSULE | Freq: Four times a day (QID) | ORAL | 4 refills | Status: DC
Start: 2016-12-23 — End: 2017-02-08

## 2016-12-23 NOTE — Assessment & Plan Note (Signed)
I will check cmp today on the Keflex

## 2016-12-23 NOTE — Progress Notes (Signed)
   Subjective:    Patient ID: Charles Parker, male    DOB: 04/09/1946, 71 y.o.   MRN: [id number]  HPI Here for follow up on discitis with epidural abscess and positive E coli bacteremia.    Initially diagnosed in December with E coli and :MRI findings as above and treated with ceftriaxone for 6 weeks.  He represented in January with progressive weakness, pain and MRI with resolution of some areas and new areas of concern.  Underwent surgical debridement.  No new cultures grew anything.   He comes in now, left SNF about 3 weeks ago and now on oral Keflex 4 times per day as I suggested.  Continues to improve and is getting home OT/PT.  No fever, chills.  No significant back pain.     Review of Systems  Gastrointestinal: Negative for diarrhea and nausea.  Musculoskeletal: Negative for back pain.  Neurological: Negative for light-headedness.       Objective:   Physical Exam  Constitutional: He appears well-developed and well-nourished. No distress.  Cardiovascular: Normal rate, regular rhythm and normal heart sounds.   Musculoskeletal: He exhibits no edema.  Skin: No rash noted.         Assessment & Plan:

## 2016-12-23 NOTE — Patient Outreach (Signed)
[place] [facility]) Care Management  12/23/2016  LEMUEL BOODRAM 11/12/1945 [id number]   Assessment- CSW completed call to [facility] and was informed that PCP office faxed completed PCS form on 12/17/16 and then they faxed it back to the PCP office on 12/20/16 with a letter requesting that they complete ICD codes part as well as the medical stability part and then fax back to [facility].   CSW completed call to patient's brother [name] and provided update listed above. He reports that [place] has already contacted PCP office with this update. He reports that family is wishing to follow through with PACE program at this time as well but understands that once he is enrolled in program (there is a wait time for program) then he will not be eligible to receive PCS services.   Plan-CSW will follow up within one week on PCS application status and will continue to provide social work assistance.  [name], BSW, MSW, [email or web address] Phone: [phone] Fax: [phone]

## 2016-12-23 NOTE — Patient Outreach (Signed)
    Telephone call made to primary care physician to gather information regarding completion of the [facility] PCS Referral Form. Receptionist advises she relayed the information to PCP's nurse to follow up. Receptionist stated the nurses are busy with patient's at this time  Plan: [name] wake telephonic contact in the next 21 days for further community care coordination, update patient on this intervention.

## 2016-12-23 NOTE — Assessment & Plan Note (Signed)
Continues to do well.  I will recheck inflamatory markers.  Recent WBC wnl.

## 2016-12-24 ENCOUNTER — Telehealth: Payer: Self-pay | Admitting: Internal Medicine

## 2016-12-24 ENCOUNTER — Telehealth: Payer: Self-pay | Admitting: *Deleted

## 2016-12-24 ENCOUNTER — Other Ambulatory Visit: Payer: Self-pay | Admitting: Licensed Clinical Social Worker

## 2016-12-24 DIAGNOSIS — M6281 Muscle weakness (generalized): Secondary | ICD-10-CM | POA: Diagnosis not present

## 2016-12-24 DIAGNOSIS — E119 Type 2 diabetes mellitus without complications: Secondary | ICD-10-CM | POA: Diagnosis not present

## 2016-12-24 DIAGNOSIS — I252 Old myocardial infarction: Secondary | ICD-10-CM | POA: Diagnosis not present

## 2016-12-24 DIAGNOSIS — M4624 Osteomyelitis of vertebra, thoracic region: Secondary | ICD-10-CM | POA: Diagnosis not present

## 2016-12-24 DIAGNOSIS — H409 Unspecified glaucoma: Secondary | ICD-10-CM | POA: Diagnosis not present

## 2016-12-24 DIAGNOSIS — I10 Essential (primary) hypertension: Secondary | ICD-10-CM | POA: Diagnosis not present

## 2016-12-24 DIAGNOSIS — D689 Coagulation defect, unspecified: Secondary | ICD-10-CM | POA: Diagnosis not present

## 2016-12-24 DIAGNOSIS — R2689 Other abnormalities of gait and mobility: Secondary | ICD-10-CM | POA: Diagnosis not present

## 2016-12-24 DIAGNOSIS — G4730 Sleep apnea, unspecified: Secondary | ICD-10-CM | POA: Diagnosis not present

## 2016-12-24 LAB — C-REACTIVE PROTEIN: CRP: 2.8 mg/L (ref <8.0)

## 2016-12-24 LAB — SEDIMENTATION RATE: SED RATE: 6 mm/h (ref 0–20)

## 2016-12-24 NOTE — Telephone Encounter (Signed)
Relayed results to patient's brother, [name].  His questions answered to his satisfaction.  Sent MyChart sign up code at his request. Landis Gandy, RN   ----- Message from Thayer Headings, MD sent at 12/24/2016 10:18 AM EDT ----- Please let him know his labs look very good.  His sed rate inflammatory marker is now normal, kidneys are good, no issues. thanks

## 2016-12-24 NOTE — Telephone Encounter (Signed)
Brooke from [facility] called stating that [facility] care faxed back the [facility] application because they need the medical stability part completed. Please advise. Thanks [employer]

## 2016-12-24 NOTE — Telephone Encounter (Signed)
Opened this note in error.  Landis Gandy, RN

## 2016-12-24 NOTE — Telephone Encounter (Deleted)
-----   Message from Thayer Headings, MD sent at 12/24/2016 10:18 AM EDT ----- Please let him know his labs look very good.  His sed rate inflammatory marker is now normal, kidneys are good, no issues. thanks

## 2016-12-24 NOTE — Patient Outreach (Signed)
[facility] [facility]) Care Management  12/24/2016  Charles Parker 09/28/1946 [id number]   Assessment- CSW completed call to [facility] and was informed that PCP office faxed back updated PCS application but that the medical stability part was not completed this time. [facility] states that they are waiting for this to be returned with the requested information.   CSW completed call to PCP office to provide update as [facility] was unable to reach office. CSW left message with front desk to provide to PCP's nurse.  Plan-CSW will follow up within one week.  [name], BSW, MSW, [email or web address] Phone: [phone] Fax: [phone]

## 2016-12-27 DIAGNOSIS — G4730 Sleep apnea, unspecified: Secondary | ICD-10-CM | POA: Diagnosis not present

## 2016-12-27 DIAGNOSIS — R2689 Other abnormalities of gait and mobility: Secondary | ICD-10-CM | POA: Diagnosis not present

## 2016-12-27 DIAGNOSIS — E119 Type 2 diabetes mellitus without complications: Secondary | ICD-10-CM | POA: Diagnosis not present

## 2016-12-27 DIAGNOSIS — H409 Unspecified glaucoma: Secondary | ICD-10-CM | POA: Diagnosis not present

## 2016-12-27 DIAGNOSIS — M4624 Osteomyelitis of vertebra, thoracic region: Secondary | ICD-10-CM | POA: Diagnosis not present

## 2016-12-27 DIAGNOSIS — I10 Essential (primary) hypertension: Secondary | ICD-10-CM | POA: Diagnosis not present

## 2016-12-27 DIAGNOSIS — M6281 Muscle weakness (generalized): Secondary | ICD-10-CM | POA: Diagnosis not present

## 2016-12-27 DIAGNOSIS — I252 Old myocardial infarction: Secondary | ICD-10-CM | POA: Diagnosis not present

## 2016-12-27 DIAGNOSIS — D689 Coagulation defect, unspecified: Secondary | ICD-10-CM | POA: Diagnosis not present

## 2016-12-28 ENCOUNTER — Other Ambulatory Visit: Payer: Self-pay

## 2016-12-29 ENCOUNTER — Telehealth: Payer: Self-pay | Admitting: Internal Medicine

## 2016-12-29 DIAGNOSIS — M6281 Muscle weakness (generalized): Secondary | ICD-10-CM | POA: Diagnosis not present

## 2016-12-29 DIAGNOSIS — R2689 Other abnormalities of gait and mobility: Secondary | ICD-10-CM | POA: Diagnosis not present

## 2016-12-29 DIAGNOSIS — I252 Old myocardial infarction: Secondary | ICD-10-CM | POA: Diagnosis not present

## 2016-12-29 DIAGNOSIS — E119 Type 2 diabetes mellitus without complications: Secondary | ICD-10-CM | POA: Diagnosis not present

## 2016-12-29 DIAGNOSIS — D689 Coagulation defect, unspecified: Secondary | ICD-10-CM | POA: Diagnosis not present

## 2016-12-29 DIAGNOSIS — G4730 Sleep apnea, unspecified: Secondary | ICD-10-CM | POA: Diagnosis not present

## 2016-12-29 DIAGNOSIS — I10 Essential (primary) hypertension: Secondary | ICD-10-CM | POA: Diagnosis not present

## 2016-12-29 DIAGNOSIS — M4624 Osteomyelitis of vertebra, thoracic region: Secondary | ICD-10-CM | POA: Diagnosis not present

## 2016-12-29 DIAGNOSIS — H409 Unspecified glaucoma: Secondary | ICD-10-CM | POA: Diagnosis not present

## 2016-12-29 NOTE — Patient Outreach (Signed)
   Telephone call to patient to follow up on his referral to [facility]. Patient and brother, [name], said he was contacted by the [employer], [place], who provided them with information on the program. Patient and his brother stated they were weighing their options.    Patient and brother advised the necessary changes made on the paperwork for the [facility] Referral, paperwork resubmitted   Plan: Telephone contact within the next 28 days for assessment of community care coordination needs, assess progression in reaching his case management goals

## 2016-12-29 NOTE — Telephone Encounter (Signed)
Requesting verbal order to increase patients bath aid from 2 times a week to 3 times a week starting next  Week.

## 2016-12-29 NOTE — Telephone Encounter (Signed)
Verbal okay given per request.  Forwarding to PCP as fyi.

## 2016-12-31 ENCOUNTER — Other Ambulatory Visit: Payer: Self-pay | Admitting: Licensed Clinical Social Worker

## 2016-12-31 DIAGNOSIS — R2689 Other abnormalities of gait and mobility: Secondary | ICD-10-CM | POA: Diagnosis not present

## 2016-12-31 DIAGNOSIS — D689 Coagulation defect, unspecified: Secondary | ICD-10-CM | POA: Diagnosis not present

## 2016-12-31 DIAGNOSIS — I252 Old myocardial infarction: Secondary | ICD-10-CM | POA: Diagnosis not present

## 2016-12-31 DIAGNOSIS — M4624 Osteomyelitis of vertebra, thoracic region: Secondary | ICD-10-CM | POA: Diagnosis not present

## 2016-12-31 DIAGNOSIS — I10 Essential (primary) hypertension: Secondary | ICD-10-CM | POA: Diagnosis not present

## 2016-12-31 DIAGNOSIS — E119 Type 2 diabetes mellitus without complications: Secondary | ICD-10-CM | POA: Diagnosis not present

## 2016-12-31 DIAGNOSIS — H409 Unspecified glaucoma: Secondary | ICD-10-CM | POA: Diagnosis not present

## 2016-12-31 DIAGNOSIS — G4730 Sleep apnea, unspecified: Secondary | ICD-10-CM | POA: Diagnosis not present

## 2016-12-31 DIAGNOSIS — M6281 Muscle weakness (generalized): Secondary | ICD-10-CM | POA: Diagnosis not present

## 2016-12-31 NOTE — Patient Outreach (Signed)
[place] [facility]) Care Management  12/31/2016  Charles Parker 1946/01/28 [id number]  Assessment- CSW completed call to [place] to follow up on PCS status but agency was closed due to the holiday.  Plan-CSW will contact [facility] within one week to follow up on application status.  [name], BSW, MSW, [email or web address] Phone: [phone] Fax: [phone]

## 2017-01-03 ENCOUNTER — Other Ambulatory Visit: Payer: Self-pay | Admitting: Licensed Clinical Social Worker

## 2017-01-03 DIAGNOSIS — I252 Old myocardial infarction: Secondary | ICD-10-CM | POA: Diagnosis not present

## 2017-01-03 DIAGNOSIS — H409 Unspecified glaucoma: Secondary | ICD-10-CM | POA: Diagnosis not present

## 2017-01-03 DIAGNOSIS — G4730 Sleep apnea, unspecified: Secondary | ICD-10-CM | POA: Diagnosis not present

## 2017-01-03 DIAGNOSIS — M4624 Osteomyelitis of vertebra, thoracic region: Secondary | ICD-10-CM | POA: Diagnosis not present

## 2017-01-03 DIAGNOSIS — E119 Type 2 diabetes mellitus without complications: Secondary | ICD-10-CM | POA: Diagnosis not present

## 2017-01-03 DIAGNOSIS — D689 Coagulation defect, unspecified: Secondary | ICD-10-CM | POA: Diagnosis not present

## 2017-01-03 DIAGNOSIS — I10 Essential (primary) hypertension: Secondary | ICD-10-CM | POA: Diagnosis not present

## 2017-01-03 DIAGNOSIS — R2689 Other abnormalities of gait and mobility: Secondary | ICD-10-CM | POA: Diagnosis not present

## 2017-01-03 DIAGNOSIS — M6281 Muscle weakness (generalized): Secondary | ICD-10-CM | POA: Diagnosis not present

## 2017-01-03 NOTE — Patient Outreach (Signed)
[place] [facility]) Care Management  01/03/2017  DEVARIOUS PAVEK [birth year] [id number]   Assessment- CSW received incoming return call from patient's brother. Brother reports that they are still thinking about the PACE program but for now they would like to see if patient can maintain being independent at home with [facility] services. CSW scheduled home visit on 01/06/17 at 10:45 during the PCS assessment.  Plan-CSW will complete home visit this week and be an advocate for patient during [place].  [name], BSW, MSW, [email or web address] Phone: [phone] Fax: [phone]

## 2017-01-03 NOTE — Patient Outreach (Signed)
[place] [facility]) Care Management  01/03/2017  KAIKOA MAGRO [birth year] [id number]  Assessment- CSW completed call to [facility] and spoke to a representative. CSW was informed that corrected PCS application was successfully submitted and that a home assessment is scheduled for 01/06/17 at 10:30 am.  CSW completed call to patient's brother to schedule home visit for this week but was unable to reach him. HIPPA compliant voice message was left.  Plan-CSW will await to hear back from family and schedule home visit.  [name], BSW, MSW, [email or web address] Phone: [phone] Fax: [phone]

## 2017-01-04 ENCOUNTER — Ambulatory Visit (INDEPENDENT_AMBULATORY_CARE_PROVIDER_SITE_OTHER): Payer: Medicare HMO | Admitting: Podiatry

## 2017-01-04 ENCOUNTER — Encounter: Payer: Self-pay | Admitting: Podiatry

## 2017-01-04 VITALS — BP 162/110 | HR 80 | Resp 18

## 2017-01-04 DIAGNOSIS — M79674 Pain in right toe(s): Secondary | ICD-10-CM

## 2017-01-04 DIAGNOSIS — E119 Type 2 diabetes mellitus without complications: Secondary | ICD-10-CM

## 2017-01-04 DIAGNOSIS — B351 Tinea unguium: Secondary | ICD-10-CM

## 2017-01-04 DIAGNOSIS — M79675 Pain in left toe(s): Secondary | ICD-10-CM

## 2017-01-04 NOTE — Progress Notes (Signed)
   Subjective:    Patient ID: Charles Parker, male    DOB: 1945-12-15, 71 y.o.   MRN: [id number]  HPI this patient presents the office with chief complaint of long, ingrowing painful toenails on both feet. Patient is accompanied to the office by his brother. He states that his nails have become thick and long and painful walking and wearing his shoes. Patient is a diabetic taking gabapentin. He also admits to just having back surgery. He presents the office today for an evaluation and treatment of his feet    Review of Systems  All other systems reviewed and are negative.      Objective:   Physical Exam GENERAL APPEARANCE: Alert, conversant. Appropriately groomed. No acute distress.  VASCULAR: Pedal pulses are  palpable at  [facility] and PT bilateral.  Capillary refill time is immediate to all digits,  Normal temperature gradient.   NEUROLOGIC: sensation is normal to 5.07 monofilament at 5/5 sites bilateral.  Light touch is intact bilateral, Muscle strength normal.  MUSCULOSKELETAL: acceptable muscle strength, tone and stability bilateral.  Intrinsic muscluature intact bilateral.  DJD 1st MPJ  B/L.  DERMATOLOGIC: skin color, texture, and turgor are within normal limits.  No preulcerative lesions or ulcers  are seen, no interdigital maceration noted.  No open lesions present.   No drainage noted.  NAILS  Thick disfigured discolored nails both feet.         Assessment & Plan:  Onychomycosis  B/L  Debridement of long thick painful nails.  RTC 3 months.   Gardiner Barefoot DPM

## 2017-01-05 ENCOUNTER — Other Ambulatory Visit: Payer: Self-pay | Admitting: Internal Medicine

## 2017-01-05 DIAGNOSIS — D689 Coagulation defect, unspecified: Secondary | ICD-10-CM | POA: Diagnosis not present

## 2017-01-05 DIAGNOSIS — M6281 Muscle weakness (generalized): Secondary | ICD-10-CM | POA: Diagnosis not present

## 2017-01-05 DIAGNOSIS — I252 Old myocardial infarction: Secondary | ICD-10-CM | POA: Diagnosis not present

## 2017-01-05 DIAGNOSIS — R2689 Other abnormalities of gait and mobility: Secondary | ICD-10-CM | POA: Diagnosis not present

## 2017-01-05 DIAGNOSIS — H409 Unspecified glaucoma: Secondary | ICD-10-CM | POA: Diagnosis not present

## 2017-01-05 DIAGNOSIS — G4730 Sleep apnea, unspecified: Secondary | ICD-10-CM | POA: Diagnosis not present

## 2017-01-05 DIAGNOSIS — M4624 Osteomyelitis of vertebra, thoracic region: Secondary | ICD-10-CM | POA: Diagnosis not present

## 2017-01-05 DIAGNOSIS — I10 Essential (primary) hypertension: Secondary | ICD-10-CM | POA: Diagnosis not present

## 2017-01-05 DIAGNOSIS — E119 Type 2 diabetes mellitus without complications: Secondary | ICD-10-CM | POA: Diagnosis not present

## 2017-01-06 ENCOUNTER — Other Ambulatory Visit: Payer: Self-pay | Admitting: Licensed Clinical Social Worker

## 2017-01-06 NOTE — Patient Outreach (Signed)
[facility] [facility]) Care Management  [facility] Social Work  01/06/2017  Charles Parker 11-21-1945 [id number]  Encounter Medications:  Outpatient Encounter Prescriptions as of 01/06/2017  Medication Sig  . cephALEXin (KEFLEX) 500 MG capsule Take 1 capsule (500 mg total) by mouth 4 (four) times daily.  Marland Kitchen gabapentin (NEURONTIN) 300 MG capsule Take 300 mg by mouth 2 (two) times daily.   . insulin aspart (NOVOLOG FLEXPEN) 100 UNIT/ML FlexPen Inject 10 Units into the skin 3 (three) times daily with meals.  . insulin glargine (LANTUS) 100 UNIT/ML injection Inject 25 Units into the skin at bedtime.  . Insulin Pen Needle 31G X 5 MM MISC Administer lantus once nightly  . Iron Combinations (NIFEREX) TABS Take 1 tablet by mouth daily.  . Multiple Vitamin (MULTIVITAMIN WITH MINERALS) TABS tablet Take 1 tablet by mouth daily.  . nitroGLYCERIN (NITROSTAT) 0.4 MG SL tablet Place 1 tablet (0.4 mg total) under the tongue every 5 (five) minutes as needed for chest pain.  Marland Kitchen oxybutynin (DITROPAN) 5 MG tablet Take 5 mg by mouth daily.   . polyethylene glycol (MIRALAX / GLYCOLAX) packet Take 17 g by mouth 2 (two) times daily.  . rivaroxaban (XARELTO) 20 MG TABS tablet Take 1 tablet (20 mg total) by mouth daily with supper.  . simvastatin (ZOCOR) 40 MG tablet TAKE 1 TABLET(40 MG) BY MOUTH EVERY EVENING  . tamsulosin (FLOMAX) 0.4 MG CAPS capsule Take 0.4 mg by mouth daily after breakfast.   . TRUE METRIX BLOOD GLUCOSE TEST test strip USE TO CHECK BLOOD SUGARS TWICE DAILY   No facility-administered encounter medications on file as of 01/06/2017.     Functional Status:  In your present state of health, do you have any difficulty performing the following activities: 12/23/2016 12/13/2016  Hearing? N N  Vision? N Y  Difficulty concentrating or making decisions? N Y  Walking or climbing stairs? Y Y  Dressing or bathing? Y Y  Doing errands, shopping? [name]  Preparing Food and eating ? - Y  Using the Toilet? -  N  In the past six months, have you accidently leaked urine? - N  Do you have problems with loss of bowel control? - N  Managing your Medications? - Y  Managing your Finances? - Y  Housekeeping or managing your Housekeeping? - Y  Some recent data might be hidden    Fall/Depression Screening:  PHQ 2/9 Scores 12/23/2016 12/21/2016 12/15/2016 12/13/2016 10/29/2016 10/08/2016 09/22/2016  PHQ - 2 Score 0 0 0 0 0 0 0    Assessment: CSW completed routine home visit on 01/06/17. Patient's brother and his wife and the [facility] RN were present in order to complete home evaluation to determine if he is eligible for services. Entire assessment was completed today on 01/06/17. Family informed CSW that patient's back brace was discontinued starting on 01/03/17. Patient has been wearing back brace since surgery. CSW mailed family a list of different aide agencies for [facility] that family can choose from. Patient's brother [name] reports that he has a family friend at church that has worked at both [employer] and [employer]. Family will take time to chose which agency that would like to gain services with. Family shares that patient has still be residing with brother [name] since he discharged from SNF but they are wanting to transition him back home within the next few weeks. Family reports that [place] is still involved and that brother transport patient back to his  own residence for therapy each day in order for patient to get comfortable with doing exercises within his home. [place] RN creating care plan which includes: sponge bath (assistance with lower legs and feet especially), nail care, skin care, help with shaving, linens twice a week, make bed daily, cleaning bathroom and kitchen, assistance with back side with bathroom needs, washing dishes, making simple meals, taking trash out and medication reminders. Patient is able to give himself insulin and he can do his own mouth  care. Patient's family will continue to assist with washing his clothes since patient does not have washing machine or dryer at residence. [facility] RN shares that it will take up to two weeks to approve or decline services. Patient has an ortho appointment next week as well to follow up on needs. Patient gained some weight within the past few weeks and weighs 189 pounds today.  THN CM Care Plan Problem One     Most Recent Value  Care Plan Problem One  SNF admission and lack of resources  Role Documenting the Problem One  Clinical Social Worker  Care Plan for Problem One  Active  THN Long Term Goal (31-90 days)  Patient will have a safe and stable discharging back home from SNF within 90 days per self report  THN Long Term Goal Start Date  10/08/16  Interventions for Problem One Long Term Goal  Goal is ongoing and services are being set up to assist with safety with return home back from SNF.  THN CM Short Term Goal #1 (0-30 days)  Patient will gain food stamps within 30 days as evidenced by checking his mail and informing CSW  [facility] CM Short Term Goal #1 Start Date  10/08/16  [facility] CM Short Term Goal #1 Met Date  11/17/16  Interventions for Short Term Goal #1  Goal met  THN CM Short Term Goal #2 (0-30 days)  Patient will attend all scheduled medical appointments within the next 30 days  THN CM Short Term Goal #2 Start Date  10/08/16  [facility] CM Short Term Goal #2 Met Date  11/17/16  Interventions for Short Term Goal #2  Goal successfully met.  THN CM Short Term Goal #3 (0-30 days)  Patient will create a safe plan for his transition back home within 30 days per family and patient report  THN CM Short Term Goal #3 Start Date  11/17/16  [facility] CM Short Term Goal #3 Met Date  12/15/16  Interventions for Short Tern Goal #3  Goal met.  THN CM Short Term Goal #4 (0-30 days)  Patient will be evaluated by PCS within 30 days per family report  THN CM Short Term Goal #4 Start Date  12/15/16  [facility] CM  Short Term Goal #4 Met Date  01/06/17  Interventions for Short Term Goal #4  Goal met. Evaluation completed on 01/06/17 and CSW was present for this.  THN CM Short Term Goal #5 (0-30 days)  Patient will choose personal care service agency within 30 days per family report  THN CM Short Term Goal #5 Start Date  01/06/17  Interventions for Short Term Goal #5  CSW will ensure that family follows up with [facility] in order to gain aide services and has printed off list of different agencies for family to choose from.     Plan: CSW will follow up within 30 days. CSW will continue to provide social work assistance and support. CSW will route  encounter to PCP and [facility] [name].   [name], BSW, MSW, [email or web address] Phone: [phone] Fax: [phone]

## 2017-01-07 ENCOUNTER — Telehealth: Payer: Self-pay | Admitting: Internal Medicine

## 2017-01-07 DIAGNOSIS — M4624 Osteomyelitis of vertebra, thoracic region: Secondary | ICD-10-CM | POA: Diagnosis not present

## 2017-01-07 DIAGNOSIS — E118 Type 2 diabetes mellitus with unspecified complications: Secondary | ICD-10-CM

## 2017-01-07 DIAGNOSIS — I252 Old myocardial infarction: Secondary | ICD-10-CM | POA: Diagnosis not present

## 2017-01-07 DIAGNOSIS — D689 Coagulation defect, unspecified: Secondary | ICD-10-CM | POA: Diagnosis not present

## 2017-01-07 DIAGNOSIS — E119 Type 2 diabetes mellitus without complications: Secondary | ICD-10-CM | POA: Diagnosis not present

## 2017-01-07 DIAGNOSIS — M6281 Muscle weakness (generalized): Secondary | ICD-10-CM | POA: Diagnosis not present

## 2017-01-07 DIAGNOSIS — G4730 Sleep apnea, unspecified: Secondary | ICD-10-CM | POA: Diagnosis not present

## 2017-01-07 DIAGNOSIS — Z794 Long term (current) use of insulin: Secondary | ICD-10-CM

## 2017-01-07 DIAGNOSIS — H409 Unspecified glaucoma: Secondary | ICD-10-CM | POA: Diagnosis not present

## 2017-01-07 DIAGNOSIS — R2689 Other abnormalities of gait and mobility: Secondary | ICD-10-CM | POA: Diagnosis not present

## 2017-01-07 DIAGNOSIS — I10 Essential (primary) hypertension: Secondary | ICD-10-CM | POA: Diagnosis not present

## 2017-01-07 MED ORDER — INSULIN PEN NEEDLE 31G X 5 MM MISC: 1 refills | Status: DC

## 2017-01-07 NOTE — Telephone Encounter (Signed)
Please advise on oxybutynin refill.  erx for pen needle has been sent.

## 2017-01-07 NOTE — Telephone Encounter (Signed)
The pts brother called requesting refills on two things. He needs Oxybutynin (DITROPAN) 5 MG tablet and Insulin Pen Needle 31G X 5 MM MISC - used for both Novolog and Lantus to be sent to [facility] on [address]. He would like a call when this has been sent [phone]).

## 2017-01-09 ENCOUNTER — Other Ambulatory Visit: Payer: Self-pay | Admitting: Internal Medicine

## 2017-01-09 DIAGNOSIS — N3281 Overactive bladder: Secondary | ICD-10-CM | POA: Insufficient documentation

## 2017-01-09 MED ORDER — OXYBUTYNIN CHLORIDE 5 MG PO TABS: 5.0000 mg | ORAL_TABLET | Freq: Every day | ORAL | 3 refills | Status: DC

## 2017-01-09 MED ORDER — OXYBUTYNIN CHLORIDE 5 MG PO TABS: 5.0000 mg | ORAL_TABLET | Freq: Every day | ORAL | 1 refills | Status: DC

## 2017-01-10 DIAGNOSIS — E119 Type 2 diabetes mellitus without complications: Secondary | ICD-10-CM | POA: Diagnosis not present

## 2017-01-10 DIAGNOSIS — R2689 Other abnormalities of gait and mobility: Secondary | ICD-10-CM | POA: Diagnosis not present

## 2017-01-10 DIAGNOSIS — I10 Essential (primary) hypertension: Secondary | ICD-10-CM | POA: Diagnosis not present

## 2017-01-10 DIAGNOSIS — H409 Unspecified glaucoma: Secondary | ICD-10-CM | POA: Diagnosis not present

## 2017-01-10 DIAGNOSIS — M4624 Osteomyelitis of vertebra, thoracic region: Secondary | ICD-10-CM | POA: Diagnosis not present

## 2017-01-10 DIAGNOSIS — G4730 Sleep apnea, unspecified: Secondary | ICD-10-CM | POA: Diagnosis not present

## 2017-01-10 DIAGNOSIS — M6281 Muscle weakness (generalized): Secondary | ICD-10-CM | POA: Diagnosis not present

## 2017-01-10 DIAGNOSIS — I252 Old myocardial infarction: Secondary | ICD-10-CM | POA: Diagnosis not present

## 2017-01-10 DIAGNOSIS — D689 Coagulation defect, unspecified: Secondary | ICD-10-CM | POA: Diagnosis not present

## 2017-01-10 NOTE — Progress Notes (Signed)
[facility] [address] [place], [place] [address] Phone: [phone] Subjective:    I'm seeing this patient by the request  of:  Charles Calico, MD   CC: Left knee pain  Charles  RICHARDO Parker is a 71 y.o. male coming in with complaint of left knee pain Patient is having difficulty with this pain for some time. Past medical history is also significant for osteomyelitis of the thoracic vertebrae. Patient did need to have a thoracic fusion secondary to decompression.   Patient was fitted with left knee pain. Has been going on for some time. Has noticed weakness in this leg since his back surgery. States that his knee has difficulty bending in all the way as well as straightening it. Does feel like it's unstable. Patient is accompanied with brother who has had bilateral knee replacements. Patient states that there may be swelling. Patient denies any radiation of pain. States that he does not feel comfortable walking without his walker.    Past Medical History:  Diagnosis Date  . Clotting disorder (HCC)    DVT  . Diabetes mellitus    type II  . Diverticulosis   . DVT (deep venous thrombosis) ([place])   . Glaucoma    no eye drops   . Hepatic cyst   . Hypercholesterolemia   . Hypertension   . Myocardial infarction    very mild long ago   . Renal cyst   . Sleep apnea   . Tubulovillous adenoma    Past Surgical History:  Procedure Laterality Date  . BLADDER NECK RECONSTRUCTION  02/03/2012   Procedure: BLADDER NECK REPAIR;  Surgeon: Charles Hector, MD;  Location: [facility];  Service: General;;  . COLONOSCOPY  02/2013   polyp removal(mult.)  . KNEE SURGERY  2004   left  . POLYPECTOMY    . PROCTOSCOPY  02/03/2012   Procedure: PROCTOSCOPY;  Surgeon: Charles Hector, MD;  Location: [facility];  Service: General;;  . STOMACH SURGERY  02/2012  . TEE WITHOUT CARDIOVERSION N/A 10/05/2016   Procedure: TRANSESOPHAGEAL ECHOCARDIOGRAM (TEE);  Surgeon: Charles Klein, MD;  Location: [facility] END[facility];  Service: Cardiovascular;  Laterality: N/A;   Social History   Social History  . Marital status: Widowed    Spouse name: N/A  . Number of children: 2  . Years of education: 12   Occupational History  . retired     Financial controller   Social History Main Topics  . Smoking status: Never Smoker  . Smokeless tobacco: Never Used  . Alcohol use No     Comment: once every two months  . Drug use: No  . Sexual activity: Not Currently   Other Topics Concern  . None   Social History Narrative   Patient lives at home alone.. Patient is retired. Patient has high school education.   Right handed.- Both   Caffeine- Coffee and soda   Allergies  Allergen Reactions  . Metformin And Related Diarrhea  . Codeine Rash    All over the body   Family History  Problem Relation Age of Onset  . Hypertension Mother   . Diabetes Mother   . Cancer Mother     ? location  . Colon cancer Father 63  . Colon polyps Neg Hx     Past medical history, social, surgical and family history all reviewed in electronic medical record.  No pertanent information unless stated regarding to the chief complaint.   Review  of Systems:Review of systems updated and as accurate as of 01/11/17  No headache, visual changes, nausea, vomiting, diarrhea, constipation, dizziness, abdominal pain, skin rash, fevers, chills, night sweats, weight loss, swollen lymph nodes,chest pain, shortness of breath, mood changes.  Positive muscle aches and joint swelling  Objective  Blood pressure 132/86, pulse 100, height 5\' 11"  (1.803 m), weight 186 lb (84.4 kg). Systems examined below as of 01/11/17   General: No apparent distress alert and oriented x3 mood and affect normal, dressed appropriately.  HEENT: Pupils equal, extraocular movements intact  Respiratory: Patient's speak in full sentences and does not appear short of breath  Cardiovascular: No lower extremity edema, non tender, no erythema    Skin: Warm dry intact with no signs of infection or rash on extremities or on axial skeleton.  Abdomen: Soft nontender  Neuro: Cranial nerves II through XII are intact, neurovascularly intact in all extremities with 2+ DTRs and 2+ pulses.  Lymph: No lymphadenopathy of posterior or anterior cervical chain or axillae bilaterally.  Gait Significant weakness of the left leg and patient walks with a walker. MSK:  Mild tender with full range of motion and good stability and symmetric strength and tone of shoulders, elbows, wrist, hip and ankles bilaterally. Arthritic changes of multiple joints Knee: Left valgus deformity noted. Large thigh to calf ratio. Moderate effusion noted Tender to palpation over medial and PF joint line.  Noted range of motion lacking the last 20 of flexion of the last 10 of extension instability with valgus force.  painful patellar compression. Patellar glide with severe crepitus. Patellar and quadriceps tendons unremarkable. Hamstring and quadriceps strength is 4 out of 5 compared to the contralateral side Contralateral knee shows arthritic changes but no pain  Procedure: Real-time Ultrasound Guided Injection of left knee Device: GE Logiq Q7 Ultrasound guided injection is preferred based studies that show increased duration, increased effect, greater accuracy, decreased procedural pain, increased response rate, and decreased cost with ultrasound guided versus blind injection.  Verbal informed consent obtained.  Time-out conducted.  Noted no overlying erythema, induration, or other signs of local infection.  Skin prepped in a sterile fashion.  Local anesthesia: Topical Ethyl chloride.  With sterile technique and under real time ultrasound guidance: With a 22-gauge 2 inch needle patient was injected with 2 cc of 0.5% Marcaine and aspirated 45 mL of strawlike fluid. Then injected 1 cc of Kenalog 40 mg/dL. This was from a superior lateral approach.  Completed without  difficulty  Pain immediately resolved suggesting accurate placement of the medication.  Advised to call if fevers/chills, erythema, induration, drainage, or persistent bleeding.  Images permanently stored and available for review in the ultrasound unit.  Impression: Technically successful ultrasound guided injection.     Impression and Recommendations:     This case required medical decision making of moderate complexity.      Note: This dictation was prepared with Dragon dictation along with smaller phrase technology. Any transcriptional errors that result from this process are unintentional.

## 2017-01-11 ENCOUNTER — Ambulatory Visit (INDEPENDENT_AMBULATORY_CARE_PROVIDER_SITE_OTHER): Payer: Medicare HMO | Admitting: Family Medicine

## 2017-01-11 ENCOUNTER — Other Ambulatory Visit: Payer: Medicare HMO

## 2017-01-11 ENCOUNTER — Ambulatory Visit: Payer: Self-pay

## 2017-01-11 ENCOUNTER — Encounter: Payer: Self-pay | Admitting: Family Medicine

## 2017-01-11 VITALS — BP 132/86 | HR 100 | Ht 71.0 in | Wt 186.0 lb

## 2017-01-11 DIAGNOSIS — M1712 Unilateral primary osteoarthritis, left knee: Secondary | ICD-10-CM | POA: Diagnosis not present

## 2017-01-11 DIAGNOSIS — M25562 Pain in left knee: Secondary | ICD-10-CM

## 2017-01-11 DIAGNOSIS — M76821 Posterior tibial tendinitis, right leg: Secondary | ICD-10-CM | POA: Insufficient documentation

## 2017-01-11 NOTE — Telephone Encounter (Signed)
Pt is asking about his medications. Wondering when the other will be filled. Please let him know when it is refilled. Thanks.

## 2017-01-11 NOTE — Patient Instructions (Signed)
Good to see you  Knee arthritis.  Ice 20 minutes 2 times daily. Usually after activity and before bed. pennsaid pinkie amount topically 2 times daily as needed.  Once weekly vitamin D for 8 weeks.  We will get you a brace and they will call you  Call me Monday if not 50% better Look into to Dr. Percell Miller or Dr. Mayer Camel.  See me again in 3-4 weeks. If not better we could consider orthovisc.

## 2017-01-11 NOTE — Assessment & Plan Note (Signed)
Patient had aspiration again today. 40 mL of fluid removed. [place] none to lab with recent infection but does not appear to be an infectious etiology. Patient does have some loose bodies in the knee and does have severe osteophytic changes. Stability brace ordered today. Topical anti-inflammatories. Encourage him to continue to stay active. Patient does have weakness on the left leg as well. Follow-up with me again in 4 weeks. Could be a candidate for viscous supplementation. If no improvement sent to surgery for replacement.

## 2017-01-11 NOTE — Telephone Encounter (Signed)
This was refilled. Can you call pt?

## 2017-01-12 DIAGNOSIS — D689 Coagulation defect, unspecified: Secondary | ICD-10-CM | POA: Diagnosis not present

## 2017-01-12 DIAGNOSIS — E119 Type 2 diabetes mellitus without complications: Secondary | ICD-10-CM | POA: Diagnosis not present

## 2017-01-12 DIAGNOSIS — M4624 Osteomyelitis of vertebra, thoracic region: Secondary | ICD-10-CM | POA: Diagnosis not present

## 2017-01-12 DIAGNOSIS — I252 Old myocardial infarction: Secondary | ICD-10-CM | POA: Diagnosis not present

## 2017-01-12 DIAGNOSIS — H409 Unspecified glaucoma: Secondary | ICD-10-CM | POA: Diagnosis not present

## 2017-01-12 DIAGNOSIS — G4730 Sleep apnea, unspecified: Secondary | ICD-10-CM | POA: Diagnosis not present

## 2017-01-12 DIAGNOSIS — I10 Essential (primary) hypertension: Secondary | ICD-10-CM | POA: Diagnosis not present

## 2017-01-12 DIAGNOSIS — R2689 Other abnormalities of gait and mobility: Secondary | ICD-10-CM | POA: Diagnosis not present

## 2017-01-12 DIAGNOSIS — M6281 Muscle weakness (generalized): Secondary | ICD-10-CM | POA: Diagnosis not present

## 2017-01-12 LAB — SYNOVIAL CELL COUNT + DIFF, W/ CRYSTALS
Basophils, %: 0 %
EOSINOPHILS-SYNOVIAL: 0 % (ref 0–2)
LYMPHOCYTES-SYNOVIAL FLD: 72 % (ref 0–74)
MONOCYTE/MACROPHAGE: 21 % (ref 0–69)
Neutrophil, Synovial: 5 % (ref 0–24)
SYNOVIOCYTES, %: 3 % (ref 0–15)
WBC, SYNOVIAL: 115 {cells}/uL (ref <150)

## 2017-01-12 NOTE — Progress Notes (Unsigned)
norm

## 2017-01-14 ENCOUNTER — Telehealth: Payer: Self-pay | Admitting: Internal Medicine

## 2017-01-14 ENCOUNTER — Ambulatory Visit: Payer: Self-pay

## 2017-01-14 ENCOUNTER — Other Ambulatory Visit: Payer: Self-pay | Admitting: Licensed Clinical Social Worker

## 2017-01-14 DIAGNOSIS — E785 Hyperlipidemia, unspecified: Secondary | ICD-10-CM

## 2017-01-14 DIAGNOSIS — R2689 Other abnormalities of gait and mobility: Secondary | ICD-10-CM | POA: Diagnosis not present

## 2017-01-14 DIAGNOSIS — G4730 Sleep apnea, unspecified: Secondary | ICD-10-CM | POA: Diagnosis not present

## 2017-01-14 DIAGNOSIS — E113319 Type 2 diabetes mellitus with moderate nonproliferative diabetic retinopathy with macular edema, unspecified eye: Secondary | ICD-10-CM

## 2017-01-14 DIAGNOSIS — I252 Old myocardial infarction: Secondary | ICD-10-CM | POA: Diagnosis not present

## 2017-01-14 DIAGNOSIS — M4624 Osteomyelitis of vertebra, thoracic region: Secondary | ICD-10-CM | POA: Diagnosis not present

## 2017-01-14 DIAGNOSIS — E119 Type 2 diabetes mellitus without complications: Secondary | ICD-10-CM | POA: Diagnosis not present

## 2017-01-14 DIAGNOSIS — H409 Unspecified glaucoma: Secondary | ICD-10-CM | POA: Diagnosis not present

## 2017-01-14 DIAGNOSIS — I824Y1 Acute embolism and thrombosis of unspecified deep veins of right proximal lower extremity: Secondary | ICD-10-CM

## 2017-01-14 DIAGNOSIS — D689 Coagulation defect, unspecified: Secondary | ICD-10-CM | POA: Diagnosis not present

## 2017-01-14 DIAGNOSIS — M6281 Muscle weakness (generalized): Secondary | ICD-10-CM | POA: Diagnosis not present

## 2017-01-14 DIAGNOSIS — I10 Essential (primary) hypertension: Secondary | ICD-10-CM | POA: Diagnosis not present

## 2017-01-14 MED ORDER — SIMVASTATIN 40 MG PO TABS: 40.0000 mg | ORAL_TABLET | Freq: Every day | ORAL | 3 refills | Status: DC

## 2017-01-14 MED ORDER — VITAMIN D (ERGOCALCIFEROL) 1.25 MG (50000 UNIT) PO CAPS: 50000.0000 [IU] | ORAL_CAPSULE | ORAL | 0 refills | Status: DC

## 2017-01-14 MED ORDER — RIVAROXABAN 20 MG PO TABS: 20.0000 mg | ORAL_TABLET | Freq: Every day | ORAL | 3 refills | Status: DC

## 2017-01-14 NOTE — Patient Outreach (Signed)
[place] [facility]) Care Management  01/14/2017  Charles Parker December 25, 1945 [id number]  Assessment- CSW completed outreach call to patient's brother and he answered. He reports that patient is doing well. He states that he has not heard from [facility] or the agency that they chose. He shares that he expects a call from them next week. Brother reports that patient continues to get PT and that they have extended his therapy out until next week with the possibility of another half of a week. He shared that PT is very impressed with patient's progress. Brother shares that tonight patient will be staying at his home alone. He shares that this will be the first night that patient has stayed there in months. However, they have worked up to this and have had patient stay at his residence for a few hours after his PT.  Plan-CSW will follow up with family within two weeks and will outreach to [facility] if needed.  [name], BSW, MSW, [email or web address] Phone: [phone] Fax: [phone]

## 2017-01-14 NOTE — Telephone Encounter (Signed)
Requesting verbal orders to continue OT for two week one and one week one.

## 2017-01-14 NOTE — Telephone Encounter (Signed)
Spoke to [place] and reviewed notes.   Sent in the Vitamin D as noted in AVS with Dr. Tamala Julian and erx sent of the simvastatin and rivaroxaban.

## 2017-01-14 NOTE — Telephone Encounter (Signed)
Requesting verbals for physical therapy for an additional 3x2 2x2

## 2017-01-14 NOTE — Telephone Encounter (Signed)
Pt brother called in and said that there was suppose to be some Vit D called into pharmacy from Dr Tamala Julian ???        He also needs refill for simvastatin and rivaroxaban called into pharmarcy

## 2017-01-14 NOTE — Telephone Encounter (Signed)
Verbal okay called in as requested.  Forwarding to PCP as fyi.

## 2017-01-17 ENCOUNTER — Other Ambulatory Visit: Payer: Self-pay

## 2017-01-18 NOTE — Patient Outreach (Signed)
[place] [facility]) Care Management  01/18/2017   Charles Parker August 06, 1946 [id number]  Subjective:  I have been doing good. I have started back to going to church  Objective:  Telephone encounter  Current Medications:  Current Outpatient Prescriptions  Medication Sig Dispense Refill  . cephALEXin (KEFLEX) 500 MG capsule Take 1 capsule (500 mg total) by mouth 4 (four) times daily. 120 capsule 4  . gabapentin (NEURONTIN) 300 MG capsule Take 300 mg by mouth 2 (two) times daily.     . insulin aspart (NOVOLOG FLEXPEN) 100 UNIT/ML FlexPen Inject 10 Units into the skin 3 (three) times daily with meals. 15 mL 11  . insulin glargine (LANTUS) 100 UNIT/ML injection Inject 25 Units into the skin at bedtime.    . Insulin Pen Needle 31G X 5 MM MISC Use with both insulin pens 400 each 1  . Iron Combinations (NIFEREX) TABS Take 1 tablet by mouth daily. 90 tablet 0  . Multiple Vitamin (MULTIVITAMIN WITH MINERALS) TABS tablet Take 1 tablet by mouth daily.    . nitroGLYCERIN (NITROSTAT) 0.4 MG SL tablet Place 1 tablet (0.4 mg total) under the tongue every 5 (five) minutes as needed for chest pain. 50 tablet 3  . oxybutynin (DITROPAN) 5 MG tablet TAKE 1 TABLET(5 MG) BY MOUTH DAILY 90 tablet 1  . polyethylene glycol (MIRALAX / GLYCOLAX) packet Take 17 g by mouth 2 (two) times daily. 14 each 0  . rivaroxaban (XARELTO) 20 MG TABS tablet Take 1 tablet (20 mg total) by mouth daily with supper. 90 tablet 3  . simvastatin (ZOCOR) 40 MG tablet Take 1 tablet (40 mg total) by mouth at bedtime. 90 tablet 3  . tamsulosin (FLOMAX) 0.4 MG CAPS capsule Take 0.4 mg by mouth daily after breakfast.     . TRUE METRIX BLOOD GLUCOSE TEST test strip USE TO CHECK BLOOD SUGARS TWICE DAILY 200 each 3  . Vitamin D, Ergocalciferol, (DRISDOL) 50000 units CAPS capsule Take 1 capsule (50,000 Units total) by mouth every 7 (seven) days. 12 capsule 0   No current facility-administered medications for this visit.      Functional Status:  In your present state of health, do you have any difficulty performing the following activities: 12/23/2016 12/13/2016  Hearing? N N  Vision? N Y  Difficulty concentrating or making decisions? N Y  Walking or climbing stairs? Y Y  Dressing or bathing? Y Y  Doing errands, shopping? Charles Parker  Preparing Food and eating ? - Y  Using the Toilet? - N  In the past six months, have you accidently leaked urine? - N  Do you have problems with loss of bowel control? - N  Managing your Medications? - Y  Managing your Finances? - Y  Housekeeping or managing your Housekeeping? - Y  Some recent data might be hidden    Fall/Depression Screening: PHQ 2/9 Scores 12/23/2016 12/21/2016 12/15/2016 12/13/2016 10/29/2016 10/08/2016 09/22/2016  PHQ - 2 Score 0 0 0 0 0 0 0   THN CM Care Plan Problem One     Most Recent Value  Care Plan Problem One  recently discarged from skilled nursing facility  Role Documenting the Problem One  Care Management [name] for Problem One  Active  THN Long Term Goal (31-90 days)  patient will have no acute care admissions in the next 31 related to diabetes  THN Long Term Goal Start Date  12/13/16  [name] Long Term Goal Met Date  01/18/17  Interventions for Problem One Long Term Goal  telephone call made to assess patient's progress.  Patient continues to remain compliant with his treatment regimen, has had no acute care advissions  THN CM Short Term Goal #1 (0-30 days)  In the next 21 days, patient will meet with [name] for diabetes education  [name] CM Short Term Goal #1 Start Date  12/13/16  [name] CM Short Term Goal #1 Met Date  11/17/16  Interventions for Short Term Goal #1  initial assessment of needs during transition of care call  THN CM Short Term Goal #2 (0-30 days)  Patient will attend all scheduled medical appointments within the next 30 days  THN CM Short Term Goal #2 Start Date  10/08/16  [name] CM Short Term Goal #2 Met Date  11/17/16   Interventions for Short Term Goal #2  Goal successfully met.  THN CM Short Term Goal #3 (0-30 days)  Patient will create a safe plan for his transition back home within 30 days per family and patient report  THN CM Short Term Goal #3 Start Date  11/17/16  [name] CM Short Term Goal #3 Met Date  12/15/16  Interventions for Short Tern Goal #3  Goal met.  THN CM Short Term Goal #5 (0-30 days)  Patient will choose personal care service agency within 30 days per family report  THN CM Short Term Goal #5 Start Date  01/06/17  [facility] CM Short Term Goal #5 Met Date  01/17/17  Interventions for Short Term Goal #5  [name] spoke with patient and family who reports  chosing a [name] from list provided by the [name].     THN CM Care Plan Problem Two     Most Recent Value  Care Plan Problem Two  patient has limted knowledge reated to community support to remain in home  Role Documenting the Problem Two  Care Management [name] for Problem Two  Active  THN CM Short Term Goal #1 (0-30 days)  In the next 21 days, patient will meet with [name] to assess needs for community care coordination  [facility] CM Short Term Goal #1 Start Date  12/13/16  [facility] CM Short Term Goal #1 Met Date   01/17/17     Assessment:  Patient has been assessed and has chosen a provider for in home aide services. Patient continues to receive occupational and physical therapy, patient reports he is gaining his strength and balance back.  Plan:  Telephone encounter in the next 21 days to assess for progress in reaching his case management goals

## 2017-01-19 DIAGNOSIS — M6281 Muscle weakness (generalized): Secondary | ICD-10-CM | POA: Diagnosis not present

## 2017-01-19 DIAGNOSIS — I252 Old myocardial infarction: Secondary | ICD-10-CM | POA: Diagnosis not present

## 2017-01-19 DIAGNOSIS — Z0289 Encounter for other administrative examinations: Secondary | ICD-10-CM

## 2017-01-19 DIAGNOSIS — R2689 Other abnormalities of gait and mobility: Secondary | ICD-10-CM | POA: Diagnosis not present

## 2017-01-19 DIAGNOSIS — E119 Type 2 diabetes mellitus without complications: Secondary | ICD-10-CM | POA: Diagnosis not present

## 2017-01-19 DIAGNOSIS — G4730 Sleep apnea, unspecified: Secondary | ICD-10-CM | POA: Diagnosis not present

## 2017-01-19 DIAGNOSIS — I10 Essential (primary) hypertension: Secondary | ICD-10-CM | POA: Diagnosis not present

## 2017-01-19 DIAGNOSIS — H409 Unspecified glaucoma: Secondary | ICD-10-CM | POA: Diagnosis not present

## 2017-01-19 DIAGNOSIS — M4624 Osteomyelitis of vertebra, thoracic region: Secondary | ICD-10-CM | POA: Diagnosis not present

## 2017-01-19 DIAGNOSIS — D689 Coagulation defect, unspecified: Secondary | ICD-10-CM | POA: Diagnosis not present

## 2017-01-20 DIAGNOSIS — I10 Essential (primary) hypertension: Secondary | ICD-10-CM | POA: Diagnosis not present

## 2017-01-20 DIAGNOSIS — G4730 Sleep apnea, unspecified: Secondary | ICD-10-CM | POA: Diagnosis not present

## 2017-01-20 DIAGNOSIS — I252 Old myocardial infarction: Secondary | ICD-10-CM | POA: Diagnosis not present

## 2017-01-20 DIAGNOSIS — E119 Type 2 diabetes mellitus without complications: Secondary | ICD-10-CM | POA: Diagnosis not present

## 2017-01-20 DIAGNOSIS — M4624 Osteomyelitis of vertebra, thoracic region: Secondary | ICD-10-CM | POA: Diagnosis not present

## 2017-01-20 DIAGNOSIS — H409 Unspecified glaucoma: Secondary | ICD-10-CM | POA: Diagnosis not present

## 2017-01-20 DIAGNOSIS — D689 Coagulation defect, unspecified: Secondary | ICD-10-CM | POA: Diagnosis not present

## 2017-01-20 DIAGNOSIS — R2689 Other abnormalities of gait and mobility: Secondary | ICD-10-CM | POA: Diagnosis not present

## 2017-01-20 DIAGNOSIS — M6281 Muscle weakness (generalized): Secondary | ICD-10-CM | POA: Diagnosis not present

## 2017-01-21 ENCOUNTER — Other Ambulatory Visit: Payer: Self-pay | Admitting: Licensed Clinical Social Worker

## 2017-01-21 DIAGNOSIS — I252 Old myocardial infarction: Secondary | ICD-10-CM | POA: Diagnosis not present

## 2017-01-21 DIAGNOSIS — G4730 Sleep apnea, unspecified: Secondary | ICD-10-CM | POA: Diagnosis not present

## 2017-01-21 DIAGNOSIS — M6281 Muscle weakness (generalized): Secondary | ICD-10-CM | POA: Diagnosis not present

## 2017-01-21 DIAGNOSIS — H409 Unspecified glaucoma: Secondary | ICD-10-CM | POA: Diagnosis not present

## 2017-01-21 DIAGNOSIS — D689 Coagulation defect, unspecified: Secondary | ICD-10-CM | POA: Diagnosis not present

## 2017-01-21 DIAGNOSIS — R2689 Other abnormalities of gait and mobility: Secondary | ICD-10-CM | POA: Diagnosis not present

## 2017-01-21 DIAGNOSIS — I10 Essential (primary) hypertension: Secondary | ICD-10-CM | POA: Diagnosis not present

## 2017-01-21 DIAGNOSIS — E119 Type 2 diabetes mellitus without complications: Secondary | ICD-10-CM | POA: Diagnosis not present

## 2017-01-21 DIAGNOSIS — M4624 Osteomyelitis of vertebra, thoracic region: Secondary | ICD-10-CM | POA: Diagnosis not present

## 2017-01-21 NOTE — Patient Outreach (Signed)
[place] [facility]) Care Management  01/21/2017  Charles Parker December 31, 1945 [id number]   Assessment- CSW received incoming text message from patient's brother that stated that patient was approved for 61 hours per month and that they will need to pick an agency. CSW has already provided family with a list of different agencies to choose from and they have narrowed it down to two agencies. Patient's brother stated that patient is doing "well."  Plan-CSW will continue to follow case and provide social work support.  Charles Parker, BSW, MSW, [email or web address] Phone: [phone] Fax: [phone]

## 2017-01-22 DIAGNOSIS — M4624 Osteomyelitis of vertebra, thoracic region: Secondary | ICD-10-CM | POA: Diagnosis not present

## 2017-01-22 DIAGNOSIS — R2689 Other abnormalities of gait and mobility: Secondary | ICD-10-CM | POA: Diagnosis not present

## 2017-01-22 DIAGNOSIS — E139 Other specified diabetes mellitus without complications: Secondary | ICD-10-CM | POA: Diagnosis not present

## 2017-01-22 DIAGNOSIS — M4324 Fusion of spine, thoracic region: Secondary | ICD-10-CM | POA: Diagnosis not present

## 2017-01-22 DIAGNOSIS — Z981 Arthrodesis status: Secondary | ICD-10-CM | POA: Diagnosis not present

## 2017-01-22 DIAGNOSIS — G062 Extradural and subdural abscess, unspecified: Secondary | ICD-10-CM | POA: Diagnosis not present

## 2017-01-24 DIAGNOSIS — I10 Essential (primary) hypertension: Secondary | ICD-10-CM | POA: Diagnosis not present

## 2017-01-24 DIAGNOSIS — M6281 Muscle weakness (generalized): Secondary | ICD-10-CM | POA: Diagnosis not present

## 2017-01-24 DIAGNOSIS — M4624 Osteomyelitis of vertebra, thoracic region: Secondary | ICD-10-CM | POA: Diagnosis not present

## 2017-01-24 DIAGNOSIS — H409 Unspecified glaucoma: Secondary | ICD-10-CM | POA: Diagnosis not present

## 2017-01-24 DIAGNOSIS — E119 Type 2 diabetes mellitus without complications: Secondary | ICD-10-CM | POA: Diagnosis not present

## 2017-01-24 DIAGNOSIS — D689 Coagulation defect, unspecified: Secondary | ICD-10-CM | POA: Diagnosis not present

## 2017-01-24 DIAGNOSIS — G4730 Sleep apnea, unspecified: Secondary | ICD-10-CM | POA: Diagnosis not present

## 2017-01-24 DIAGNOSIS — R2689 Other abnormalities of gait and mobility: Secondary | ICD-10-CM | POA: Diagnosis not present

## 2017-01-24 DIAGNOSIS — I252 Old myocardial infarction: Secondary | ICD-10-CM | POA: Diagnosis not present

## 2017-01-25 ENCOUNTER — Other Ambulatory Visit: Payer: Self-pay | Admitting: Licensed Clinical Social Worker

## 2017-01-25 DIAGNOSIS — I10 Essential (primary) hypertension: Secondary | ICD-10-CM | POA: Diagnosis not present

## 2017-01-25 DIAGNOSIS — G4730 Sleep apnea, unspecified: Secondary | ICD-10-CM | POA: Diagnosis not present

## 2017-01-25 DIAGNOSIS — M6281 Muscle weakness (generalized): Secondary | ICD-10-CM | POA: Diagnosis not present

## 2017-01-25 DIAGNOSIS — M4624 Osteomyelitis of vertebra, thoracic region: Secondary | ICD-10-CM | POA: Diagnosis not present

## 2017-01-25 DIAGNOSIS — H409 Unspecified glaucoma: Secondary | ICD-10-CM | POA: Diagnosis not present

## 2017-01-25 DIAGNOSIS — E119 Type 2 diabetes mellitus without complications: Secondary | ICD-10-CM | POA: Diagnosis not present

## 2017-01-25 DIAGNOSIS — I252 Old myocardial infarction: Secondary | ICD-10-CM | POA: Diagnosis not present

## 2017-01-25 DIAGNOSIS — R2689 Other abnormalities of gait and mobility: Secondary | ICD-10-CM | POA: Diagnosis not present

## 2017-01-25 DIAGNOSIS — D689 Coagulation defect, unspecified: Secondary | ICD-10-CM | POA: Diagnosis not present

## 2017-01-25 NOTE — Patient Outreach (Signed)
[facility]) Care Management  01/25/2017  MCDANIEL OHMS 01-11-1946 [id number]   Assessment- CSW completed outreach call to patient and he answered. CSW scheduled home visit for tomorrow on 01/26/17.  Plan-CSW will continue to provide social work support and services.  [name], BSW, MSW, [email or web address] Phone: [phone] Fax: [phone]

## 2017-01-26 ENCOUNTER — Other Ambulatory Visit: Payer: Self-pay | Admitting: Licensed Clinical Social Worker

## 2017-01-26 DIAGNOSIS — M1712 Unilateral primary osteoarthritis, left knee: Secondary | ICD-10-CM | POA: Diagnosis not present
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
## 2017-01-28 DIAGNOSIS — G4730 Sleep apnea, unspecified: Secondary | ICD-10-CM | POA: Diagnosis not present

## 2017-01-28 DIAGNOSIS — D689 Coagulation defect, unspecified: Secondary | ICD-10-CM | POA: Diagnosis not present

## 2017-01-28 DIAGNOSIS — E119 Type 2 diabetes mellitus without complications: Secondary | ICD-10-CM | POA: Diagnosis not present

## 2017-01-28 DIAGNOSIS — M6281 Muscle weakness (generalized): Secondary | ICD-10-CM | POA: Diagnosis not present

## 2017-01-28 DIAGNOSIS — M4624 Osteomyelitis of vertebra, thoracic region: Secondary | ICD-10-CM | POA: Diagnosis not present

## 2017-01-28 DIAGNOSIS — I252 Old myocardial infarction: Secondary | ICD-10-CM | POA: Diagnosis not present

## 2017-01-28 DIAGNOSIS — H409 Unspecified glaucoma: Secondary | ICD-10-CM | POA: Diagnosis not present

## 2017-01-28 DIAGNOSIS — R2689 Other abnormalities of gait and mobility: Secondary | ICD-10-CM | POA: Diagnosis not present

## 2017-01-28 DIAGNOSIS — I10 Essential (primary) hypertension: Secondary | ICD-10-CM | POA: Diagnosis not present

## 2017-01-31 ENCOUNTER — Telehealth: Payer: Self-pay | Admitting: Internal Medicine

## 2017-01-31 DIAGNOSIS — R2689 Other abnormalities of gait and mobility: Secondary | ICD-10-CM | POA: Diagnosis not present

## 2017-01-31 DIAGNOSIS — I10 Essential (primary) hypertension: Secondary | ICD-10-CM | POA: Diagnosis not present

## 2017-01-31 DIAGNOSIS — D689 Coagulation defect, unspecified: Secondary | ICD-10-CM | POA: Diagnosis not present

## 2017-01-31 DIAGNOSIS — H409 Unspecified glaucoma: Secondary | ICD-10-CM | POA: Diagnosis not present

## 2017-01-31 DIAGNOSIS — M4624 Osteomyelitis of vertebra, thoracic region: Secondary | ICD-10-CM | POA: Diagnosis not present

## 2017-01-31 DIAGNOSIS — E119 Type 2 diabetes mellitus without complications: Secondary | ICD-10-CM | POA: Diagnosis not present

## 2017-01-31 DIAGNOSIS — M6281 Muscle weakness (generalized): Secondary | ICD-10-CM | POA: Diagnosis not present

## 2017-01-31 DIAGNOSIS — G4730 Sleep apnea, unspecified: Secondary | ICD-10-CM | POA: Diagnosis not present

## 2017-01-31 DIAGNOSIS — I252 Old myocardial infarction: Secondary | ICD-10-CM | POA: Diagnosis not present

## 2017-02-02 DIAGNOSIS — I252 Old myocardial infarction: Secondary | ICD-10-CM | POA: Diagnosis not present

## 2017-02-02 DIAGNOSIS — M4624 Osteomyelitis of vertebra, thoracic region: Secondary | ICD-10-CM | POA: Diagnosis not present

## 2017-02-02 DIAGNOSIS — M6281 Muscle weakness (generalized): Secondary | ICD-10-CM | POA: Diagnosis not present

## 2017-02-02 DIAGNOSIS — G4730 Sleep apnea, unspecified: Secondary | ICD-10-CM | POA: Diagnosis not present

## 2017-02-02 DIAGNOSIS — I10 Essential (primary) hypertension: Secondary | ICD-10-CM | POA: Diagnosis not present

## 2017-02-02 DIAGNOSIS — D689 Coagulation defect, unspecified: Secondary | ICD-10-CM | POA: Diagnosis not present

## 2017-02-02 DIAGNOSIS — E119 Type 2 diabetes mellitus without complications: Secondary | ICD-10-CM | POA: Diagnosis not present

## 2017-02-02 DIAGNOSIS — R2689 Other abnormalities of gait and mobility: Secondary | ICD-10-CM | POA: Diagnosis not present

## 2017-02-02 DIAGNOSIS — H409 Unspecified glaucoma: Secondary | ICD-10-CM | POA: Diagnosis not present

## 2017-02-04 ENCOUNTER — Other Ambulatory Visit: Payer: Self-pay

## 2017-02-04 NOTE — Patient Outreach (Signed)
[facility]) Care Management  02/04/2017   DAYN BARICH 03/04/1946 [id number]  Subjective:   Objective:   Current Medications:  Current Outpatient Prescriptions  Medication Sig Dispense Refill  . cephALEXin (KEFLEX) 500 MG capsule Take 1 capsule (500 mg total) by mouth 4 (four) times daily. 120 capsule 4  . gabapentin (NEURONTIN) 300 MG capsule Take 300 mg by mouth 2 (two) times daily.     . insulin aspart (NOVOLOG FLEXPEN) 100 UNIT/ML FlexPen Inject 10 Units into the skin 3 (three) times daily with meals. 15 mL 11  . insulin glargine (LANTUS) 100 UNIT/ML injection Inject 25 Units into the skin at bedtime.    . Insulin Pen Needle 31G X 5 MM MISC Use with both insulin pens 400 each 1  . Iron Combinations (NIFEREX) TABS Take 1 tablet by mouth daily. 90 tablet 0  . Multiple Vitamin (MULTIVITAMIN WITH MINERALS) TABS tablet Take 1 tablet by mouth daily.    . nitroGLYCERIN (NITROSTAT) 0.4 MG SL tablet Place 1 tablet (0.4 mg total) under the tongue every 5 (five) minutes as needed for chest pain. 50 tablet 3  . oxybutynin (DITROPAN) 5 MG tablet TAKE 1 TABLET(5 MG) BY MOUTH DAILY 90 tablet 1  . polyethylene glycol (MIRALAX / GLYCOLAX) packet Take 17 g by mouth 2 (two) times daily. 14 each 0  . rivaroxaban (XARELTO) 20 MG TABS tablet Take 1 tablet (20 mg total) by mouth daily with supper. 90 tablet 3  . simvastatin (ZOCOR) 40 MG tablet Take 1 tablet (40 mg total) by mouth at bedtime. 90 tablet 3  . tamsulosin (FLOMAX) 0.4 MG CAPS capsule Take 0.4 mg by mouth daily after breakfast.     . TRUE METRIX BLOOD GLUCOSE TEST test strip USE TO CHECK BLOOD SUGARS TWICE DAILY 200 each 3  . Vitamin D, Ergocalciferol, (DRISDOL) 50000 units CAPS capsule Take 1 capsule (50,000 Units total) by mouth every 7 (seven) days. 12 capsule 0   No current facility-administered medications for this visit.     Functional Status:  In your present state of health, do you have any difficulty  performing the following activities: 12/23/2016 12/13/2016  Hearing? N N  Vision? N Y  Difficulty concentrating or making decisions? N Y  Walking or climbing stairs? Y Y  Dressing or bathing? Y Y  Doing errands, shopping? [name]  Preparing Food and eating ? - Y  Using the Toilet? - N  In the past six months, have you accidently leaked urine? - N  Do you have problems with loss of bowel control? - N  Managing your Medications? - Y  Managing your Finances? - Y  Housekeeping or managing your Housekeeping? - Y  Some recent data might be hidden    Fall/Depression Screening: Fall Risk  12/23/2016 12/21/2016 12/21/2016  Falls in the past year? Yes Yes Yes  Number falls in past yr: 1 2 or more 2 or more  Injury with Fall? No Yes Yes  Risk Factor Category  High Fall Risk High Fall Risk High Fall Risk  Risk for fall due to : Impaired balance/gait History of fall(s);Impaired balance/gait;Impaired mobility;Impaired vision;Medication side effect History of fall(s);Impaired balance/gait;Impaired mobility;Impaired vision;Medication side effect  Follow up Falls evaluation completed;Education provided;Falls prevention discussed Falls evaluation completed Falls evaluation completed;Falls prevention discussed;Follow up appointment   [facility] 2/9 Scores 12/23/2016 12/21/2016 12/15/2016 12/13/2016 10/29/2016 10/08/2016 09/22/2016  PHQ - 2 Score 0 0 0 0 0 0 0    Assessment:  Plan:

## 2017-02-07 DIAGNOSIS — I252 Old myocardial infarction: Secondary | ICD-10-CM | POA: Diagnosis not present

## 2017-02-07 DIAGNOSIS — M4624 Osteomyelitis of vertebra, thoracic region: Secondary | ICD-10-CM | POA: Diagnosis not present

## 2017-02-07 DIAGNOSIS — M6281 Muscle weakness (generalized): Secondary | ICD-10-CM | POA: Diagnosis not present

## 2017-02-07 DIAGNOSIS — R2689 Other abnormalities of gait and mobility: Secondary | ICD-10-CM | POA: Diagnosis not present

## 2017-02-07 DIAGNOSIS — D689 Coagulation defect, unspecified: Secondary | ICD-10-CM | POA: Diagnosis not present

## 2017-02-07 DIAGNOSIS — I10 Essential (primary) hypertension: Secondary | ICD-10-CM | POA: Diagnosis not present

## 2017-02-07 DIAGNOSIS — H409 Unspecified glaucoma: Secondary | ICD-10-CM | POA: Diagnosis not present

## 2017-02-07 DIAGNOSIS — E119 Type 2 diabetes mellitus without complications: Secondary | ICD-10-CM | POA: Diagnosis not present

## 2017-02-07 DIAGNOSIS — G4730 Sleep apnea, unspecified: Secondary | ICD-10-CM | POA: Diagnosis not present

## 2017-02-07 IMAGING — CT CT ABD-PELV W/ CM
3 of 5 series · 10 of 46 positions shown, 17 images · IV contrast (Omni 300)
Comparison: 08/31/2016 go

CLINICAL DATA: Mid abdominal pain radiating to his back x1 day.

EXAM:
CT ABDOMEN AND PELVIS WITH CONTRAST
TECHNIQUE: Multidetector CT imaging of the abdomen and pelvis was performed
using the standard protocol following bolus administration of
intravenous contrast.
CONTRAST:  100mL 3HAYF4-UPP IOPAMIDOL (3HAYF4-UPP) INJECTION 61%

[Series 2: a/p w/ 5mm · axial · 0.68mm/px · z∈[-1090,-770]mm · 6 of 90 slices shown, 11 images]
[im 13/90  soft-tissue]
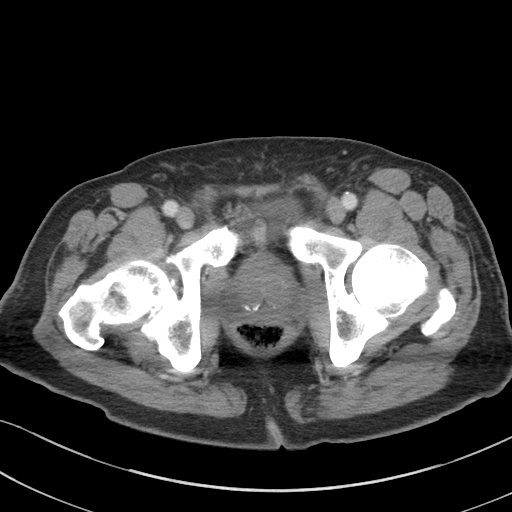
[im 13/90  bone]
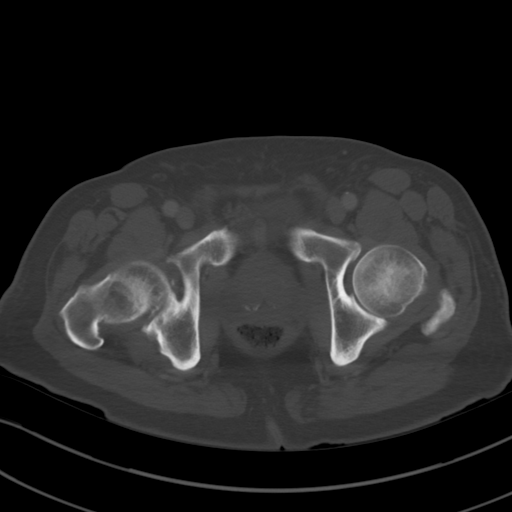
[im 26/90  soft-tissue]
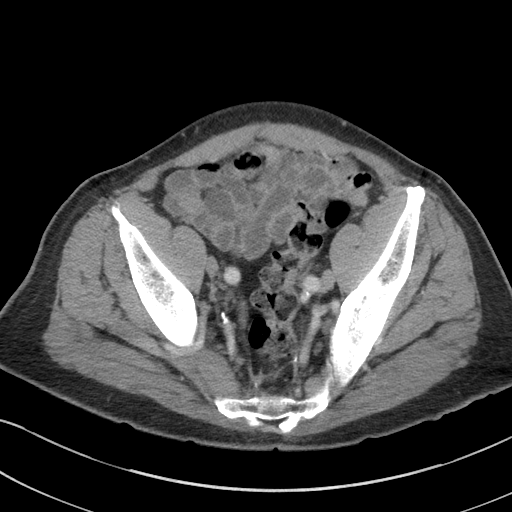
[im 39/90  soft-tissue]
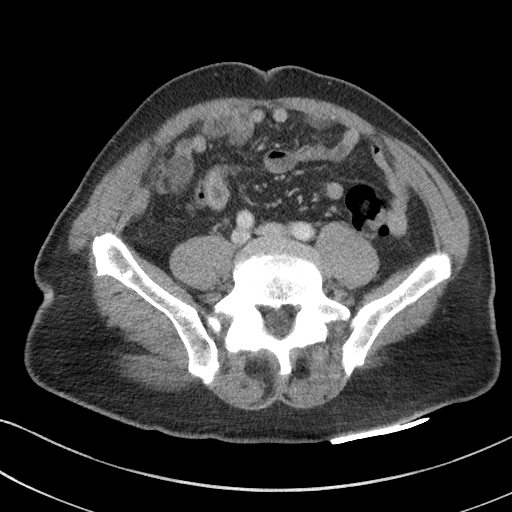
[im 39/90  lung]
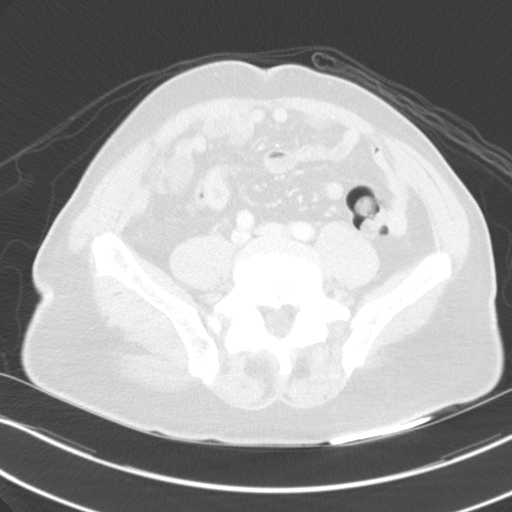
[im 51/90  soft-tissue]
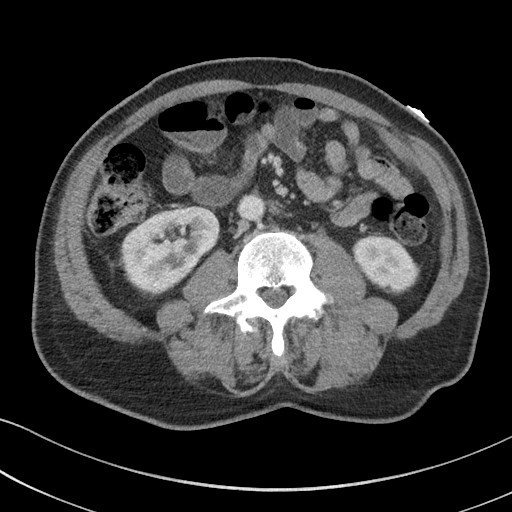
[im 51/90  lung]
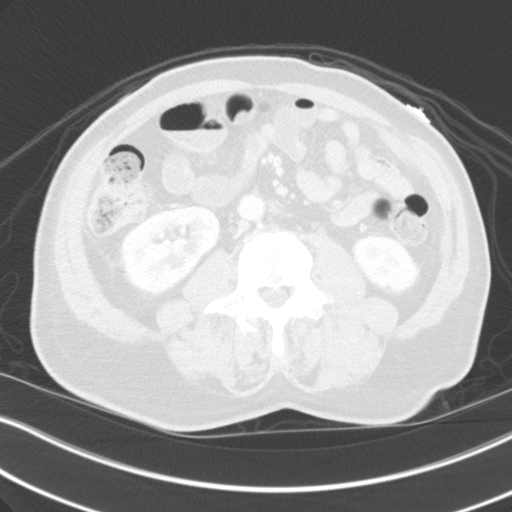
[im 64/90  soft-tissue]
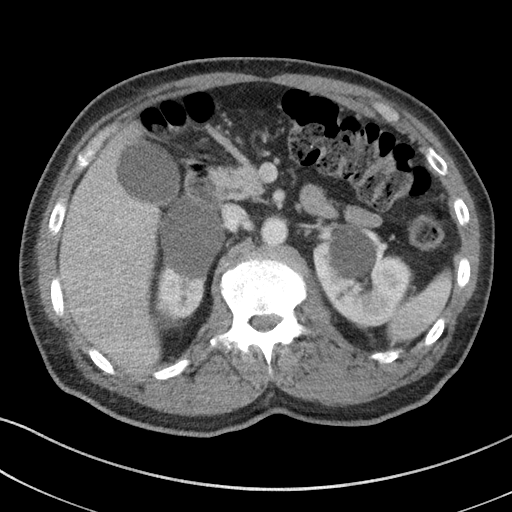
[im 64/90  lung]
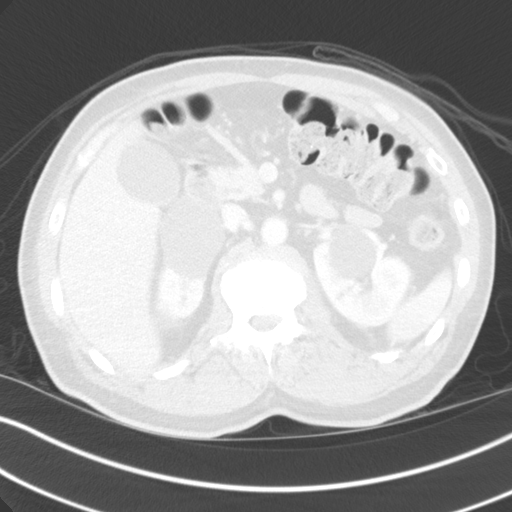
[im 77/90  soft-tissue]
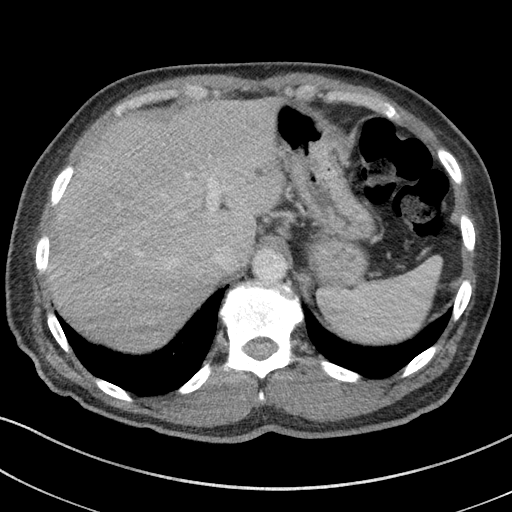
[im 77/90  lung]
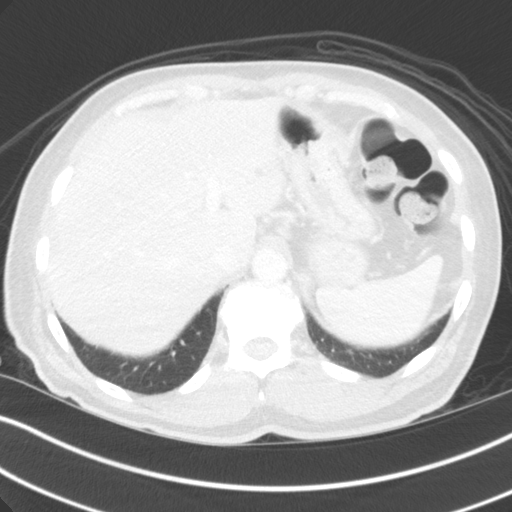

[Series 4: a/p w/ cor · coronal · 0.78mm/px · 3 of 137 slices shown, 4 images]
[im 46/137  soft-tissue]
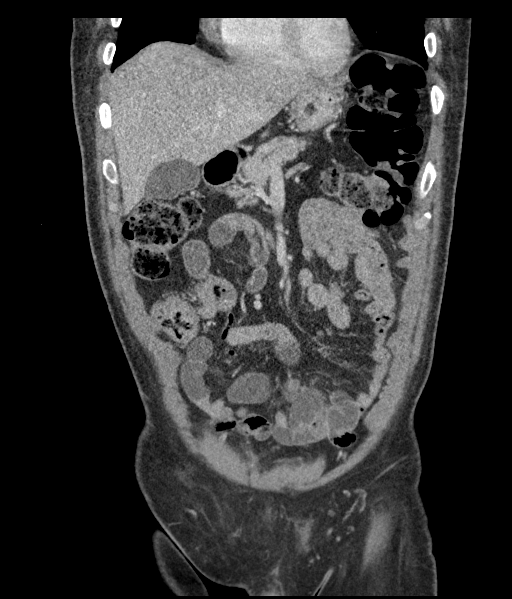
[im 61/137  soft-tissue]
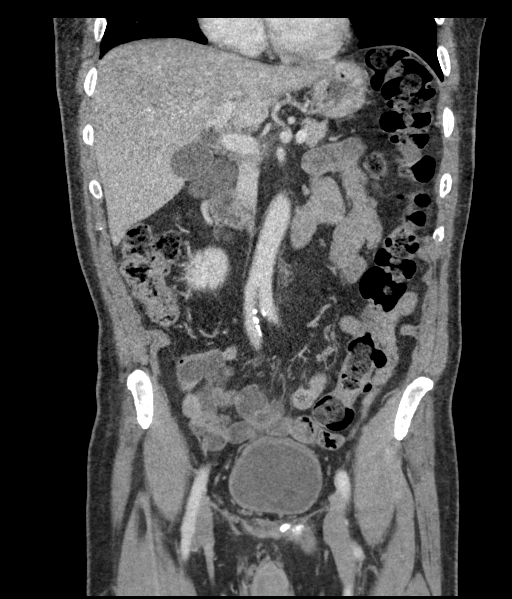
[im 61/137  bone]
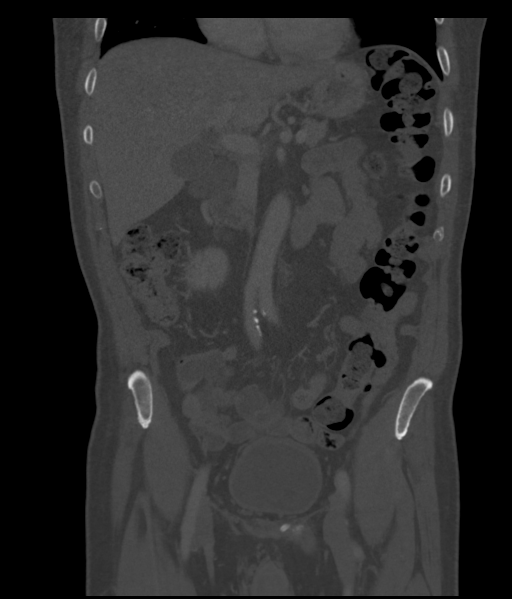
[im 76/137  soft-tissue]
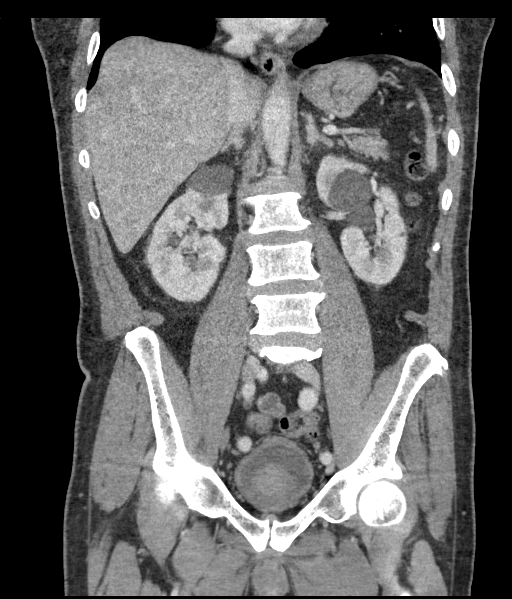

[Series 5: a/p w/ sag · sagittal · 0.67mm/px · 1 of 166 slices shown, 2 images]
[im 56/166  soft-tissue]
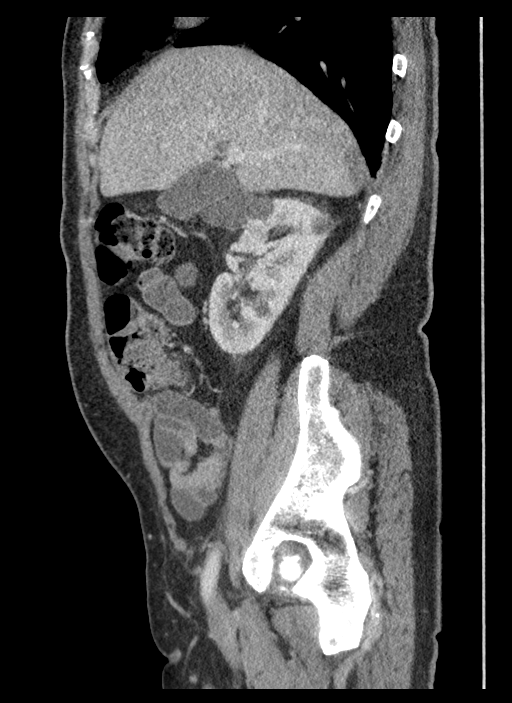
[im 56/166  bone]
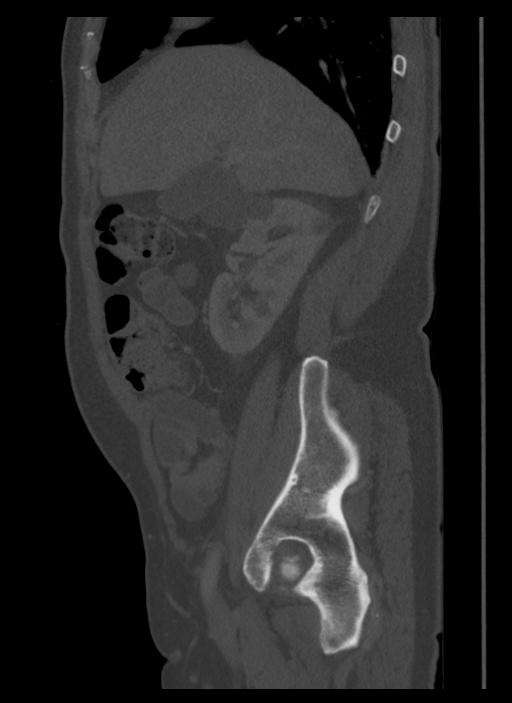

[10 of 46 positions shown; findings below may reference images not displayed]

FINDINGS: Lower chest: There is mild bilateral lower lobe bronchiectasis.
Clearing of bibasilar atelectasis and/or pneumonia. Top-normal sized
heart without pericardial nor pleural effusions. No pneumothoraces.

Hepatobiliary: No space-occupying mass of the liver. Stable 6 mm
hypodensity in the left hepatic lobe statistically consistent with a
cyst or hemangioma. No intrahepatic ductal dilatation. Tiny
dependent gallstones without secondary stoma signs: Cystitis.

Pancreas: No pancreatic mass. A few speckled calcifications along
the pancreatic tail may be vascular in etiology or related to
chronic pancreatitis. No pancreatic ductal dilatation or surrounding
inflammatory changes.

Spleen: Normal in size without focal abnormality.

Adrenals/Urinary Tract: Normal adrenal glands. Multiple hypodense
lesions of both kidneys consistent with cysts remain unchanged. The
largest are in the upper pole on the right measuring up to 5.7 cm
and on the left 3.7 cm. No obstructive uropathy. No nephrolithiasis.
No solid enhancing mass lesions. Mild circumferential thickening of
the bladder is again noted which may be related to underdistention
and/or cystitis.

Stomach/Bowel: The stomach is contracted in appearance. There is
moderate colonic stool burden without acute inflammation.
Normal-appearing appendix. No small bowel obstruction. Scattered
colonic diverticulosis.

Vascular/Lymphatic: Aortic atherosclerosis. No enlarged abdominal or
pelvic lymph nodes.

Reproductive: Irregular enlarged prostate gland containing central
zone calcifications are unchanged in appearance. This impresses upon
the base of the bladder as before.

Other: No free air or free fluid.

Musculoskeletal: No acute nor suspicious osseous abnormality. Lower
lumbar degenerative facet arthropathy.
IMPRESSION: 1. Clearing of bibasilar atelectasis and/or pneumonia since prior
exam. Bilateral lower lobe bronchiectasis is incidentally noted.
2. Stable bilateral renal hypodensities consistent with cysts. No
obstructive uropathy.
3. Uncomplicated cholelithiasis.
4. Chronic mild circumferential thickening of the urinary bladder as
before may be related to cystitis.
5. Stable prostatomegaly.
6. Lower lumbar facet arthropathy.
7. Moderate colonic stool burden. No bowel obstruction or acute
inflammation.

## 2017-02-08 ENCOUNTER — Ambulatory Visit (INDEPENDENT_AMBULATORY_CARE_PROVIDER_SITE_OTHER): Payer: Medicare HMO | Admitting: Internal Medicine

## 2017-02-08 ENCOUNTER — Telehealth: Payer: Self-pay | Admitting: *Deleted

## 2017-02-08 ENCOUNTER — Encounter: Payer: Self-pay | Admitting: Internal Medicine

## 2017-02-08 ENCOUNTER — Encounter: Payer: Self-pay | Admitting: Family Medicine

## 2017-02-08 ENCOUNTER — Ambulatory Visit (INDEPENDENT_AMBULATORY_CARE_PROVIDER_SITE_OTHER): Payer: Medicare HMO | Admitting: Family Medicine

## 2017-02-08 ENCOUNTER — Other Ambulatory Visit (INDEPENDENT_AMBULATORY_CARE_PROVIDER_SITE_OTHER): Payer: Medicare HMO

## 2017-02-08 VITALS — BP 136/80 | HR 73 | Temp 98.1°F | Resp 16 | Ht 71.0 in | Wt 183.2 lb

## 2017-02-08 DIAGNOSIS — Z794 Long term (current) use of insulin: Secondary | ICD-10-CM

## 2017-02-08 DIAGNOSIS — M1712 Unilateral primary osteoarthritis, left knee: Secondary | ICD-10-CM | POA: Diagnosis not present

## 2017-02-08 DIAGNOSIS — E118 Type 2 diabetes mellitus with unspecified complications: Secondary | ICD-10-CM

## 2017-02-08 DIAGNOSIS — M21611 Bunion of right foot: Secondary | ICD-10-CM | POA: Insufficient documentation

## 2017-02-08 LAB — BASIC METABOLIC PANEL
BUN: 16 mg/dL (ref 6–23)
CHLORIDE: 104 meq/L (ref 96–112)
CO2: 29 mEq/L (ref 19–32)
CREATININE: 0.96 mg/dL (ref 0.40–1.50)
Calcium: 10.3 mg/dL (ref 8.4–10.5)
GFR: 99.32 mL/min (ref >60.00)
GLUCOSE: 82 mg/dL (ref 70–99)
Potassium: 4.3 mEq/L (ref 3.5–5.1)
Sodium: 140 mEq/L (ref 135–145)

## 2017-02-08 LAB — MICROALBUMIN / CREATININE URINE RATIO
CREATININE, U: 217.9 mg/dL
MICROALB UR: 1 mg/dL (ref 0.0–1.9)
Microalb Creat Ratio: 0.5 mg/g (ref 0.0–30.0)

## 2017-02-08 LAB — HEMOGLOBIN A1C: Hgb A1c MFr Bld: 7.5 % — ABNORMAL HIGH (ref 4.6–6.5)

## 2017-02-08 MED ORDER — CEPHALEXIN 500 MG PO CAPS: 500.0000 mg | ORAL_CAPSULE | Freq: Four times a day (QID) | ORAL | 1 refills | Status: DC

## 2017-02-08 MED ORDER — GABAPENTIN 300 MG PO CAPS: 300.0000 mg | ORAL_CAPSULE | Freq: Two times a day (BID) | ORAL | 1 refills | Status: DC

## 2017-02-08 NOTE — Patient Instructions (Signed)
You are doing great  Keep up with what you are doing  Staying active is key Ice when you need it.  Use the brace for stability  See me again in 6 weeks and if we need we may do another injection or just see how you are doing.

## 2017-02-08 NOTE — Progress Notes (Signed)
Pre visit review using our clinic review tool, if applicable. No additional management support is needed unless otherwise documented below in the visit note. 

## 2017-02-08 NOTE — Patient Instructions (Signed)

## 2017-02-08 NOTE — Telephone Encounter (Signed)
Pharmacist left msg on triage stating receive electronic script for Keflex, and needing to verify quantity. Rx was written for #360. pls advise on correct quantity...[name]

## 2017-02-08 NOTE — Telephone Encounter (Signed)
Per chart a rx was sent today by your assistant @ 11:42 am  Do you want me to cancel script.../lmb  cephALEXin (KEFLEX) 500 MG capsule [[id number]]  Order Details  Dose: 500 mg Route: Oral Frequency: 4 times daily  Dispense Quantity:  360 capsule Refills:  1 Fills remaining:  --        Sig: Take 1 capsule (500 mg total) by mouth 4 (four) times daily.       Written Date:  02/08/17 Expiration Date:  02/08/18    Start Date:  02/08/17 End Date:  --         Ordering Provider:  Janith Lima, MD DEA #:  [id number] NPI:  [id number]   Authorizing Provider:  Janith Lima, MD DEA #:  [id number] NPI:  [id number]   Ordering User:  Johney Frame, Adelina Mings, CMA            Original Order:  cephALEXin (KEFLEX) 500 MG capsule [[id number]]    Pharmacy:  [facility] [place], [place]

## 2017-02-08 NOTE — Telephone Encounter (Signed)
Pharmacist is not questioning the medication just the quantity. You sent for #360 should it have been #120...Charles Parker

## 2017-02-08 NOTE — Progress Notes (Signed)
[name] [place], [place] [address] Phone: [phone] Subjective:    I'm seeing this patient by the request  of:  Janith Lima, MD   CC: Left knee pain  [id number]  Charles Parker is a 71 y.o. male coming in with complaint of left knee pain Patient is having difficulty with this pain for some time. Past medical history is also significant for osteomyelitis of the thoracic vertebrae. Patient did need to have a thoracic fusion secondary to decompression.   Patient was fitted with left knee pain. Patient was found to have a large knee effusion as well as underlying arthritic changes. Patient did have aspiration done. States that he is feeling 45-50% better. He is making progress. No new symptoms.       Past Medical History:  Diagnosis Date  . Clotting disorder (HCC)    DVT  . Diabetes mellitus    type II  . Diverticulosis   . DVT (deep venous thrombosis) ([place])   . Glaucoma    no eye drops   . Hepatic cyst   . Hypercholesterolemia   . Hypertension   . Myocardial infarction ([place])    very mild long ago   . Renal cyst   . Sleep apnea   . Tubulovillous adenoma    Past Surgical History:  Procedure Laterality Date  . BLADDER NECK RECONSTRUCTION  02/03/2012   Procedure: BLADDER NECK REPAIR;  Surgeon: Adin Hector, MD;  Location: [facility] ORS;  Service: General;;  . COLONOSCOPY  02/2013   polyp removal(mult.)  . KNEE SURGERY  2004   left  . POLYPECTOMY    . PROCTOSCOPY  02/03/2012   Procedure: PROCTOSCOPY;  Surgeon: Adin Hector, MD;  Location: [facility]S;  Service: General;;  . STOMACH SURGERY  02/2012  . TEE WITHOUT CARDIOVERSION N/A 10/05/2016   Procedure: TRANSESOPHAGEAL ECHOCARDIOGRAM (TEE);  Surgeon: Sanda Klein, MD;  Location: [facility];  Service: Cardiovascular;  Laterality: N/A;   Social History   Social History  . Marital status: Widowed    Spouse name: N/A  . Number of children: 2  . Years of education: 12    Occupational History  . retired     Financial controller   Social History Main Topics  . Smoking status: Never Smoker  . Smokeless tobacco: Never Used  . Alcohol use No     Comment: once every two months  . Drug use: No  . Sexual activity: Not Currently   Other Topics Concern  . None   Social History Narrative   Patient lives at home alone.. Patient is retired. Patient has high school education.   Right handed.- Both   Caffeine- Coffee and soda   Allergies  Allergen Reactions  . Metformin And Related Diarrhea  . Codeine Rash    All over the body   Family History  Problem Relation Age of Onset  . Hypertension Mother   . Diabetes Mother   . Cancer Mother     ? location  . Colon cancer Father 47  . Colon polyps Neg Hx     Past medical history, social, surgical and family history all reviewed in electronic medical record.  No pertanent information unless stated regarding to the chief complaint.   Review of Systems: No headache, visual changes, nausea, vomiting, diarrhea, constipation, dizziness, abdominal pain, skin rash, fevers, chills, night sweats, weight loss, swollen lymph nodes, body aches, joint swelling, muscle aches, chest pain, shortness of  breath, mood changes.    Objective  Blood pressure 136/80, pulse 73, temperature 98.1 F (36.7 C), temperature source Oral, resp. rate 16, weight 183 lb (83 kg), SpO2 97 %.   Systems examined below as of 02/08/17 General: NAD A&O x3 mood, affect normal  HEENT: Pupils equal, extraocular movements intact no nystagmus Respiratory: not short of breath at rest or with speaking Cardiovascular: No lower extremity edema, non tender Skin: Warm dry intact with no signs of infection or rash on extremities or on axial skeleton. Abdomen: Soft nontender, no masses Neuro: Cranial nerves  intact, neurovascularly intact in all extremities with 2+ DTRs and 2+ pulses. Lymph: No lymphadenopathy appreciated today  Gait significant antalgic gait  walking with the aid of a walker MSK: Non tender with full range of motion and good stability and symmetric strength and tone of shoulders, elbows, wrist,  knee hips and ankles bilaterally.  Arthritic changes of multiple joints  Knee: Left valgus deformity noted. Large thigh to calf ratio. No effusion noted Less tenderness than previous exam Near full range of motion lacking the last 5 of extension instability with valgus force.  Patellar glide with severe crepitus. Patellar and quadriceps tendons unremarkable. 4+ out of 5 strength compared to the contralateral side. This is an improvement Contralateral knee shows arthritic changes but no pain     Impression and Recommendations:     This case required medical decision making of moderate complexity.      Note: This dictation was prepared with Dragon dictation along with smaller phrase technology. Any transcriptional errors that result from this process are unintentional.

## 2017-02-08 NOTE — Telephone Encounter (Signed)
I DID NOT AUTHORIZE THIS RX TODAY!

## 2017-02-08 NOTE — Telephone Encounter (Signed)
You will have to call his ID doctor about this

## 2017-02-08 NOTE — Progress Notes (Signed)
Subjective:  Patient ID: Charles Parker, male    DOB: 1946-03-30  Age: 71 y.o. MRN: [id number]  CC: Diabetes   HPI Charles Parker presents for f/up - He complains of a painful bunion on his right foot. He has been worsening over the last 3 or 4 weeks. He doesn't notice any redness or swelling in the area. He is otherwise stable and offers no other complaints today. He is making progress with his rehabilitation.  Outpatient Medications Prior to Visit  Medication Sig Dispense Refill  . insulin aspart (NOVOLOG FLEXPEN) 100 UNIT/ML FlexPen Inject 10 Units into the skin 3 (three) times daily with meals. 15 mL 11  . insulin glargine (LANTUS) 100 UNIT/ML injection Inject 25 Units into the skin at bedtime.    . Insulin Pen Needle 31G X 5 MM MISC Use with both insulin pens 400 each 1  . Iron Combinations (NIFEREX) TABS Take 1 tablet by mouth daily. 90 tablet 0  . nitroGLYCERIN (NITROSTAT) 0.4 MG SL tablet Place 1 tablet (0.4 mg total) under the tongue every 5 (five) minutes as needed for chest pain. 50 tablet 3  . oxybutynin (DITROPAN) 5 MG tablet TAKE 1 TABLET(5 MG) BY MOUTH DAILY 90 tablet 1  . polyethylene glycol (MIRALAX / GLYCOLAX) packet Take 17 g by mouth 2 (two) times daily. 14 each 0  . rivaroxaban (XARELTO) 20 MG TABS tablet Take 1 tablet (20 mg total) by mouth daily with supper. 90 tablet 3  . simvastatin (ZOCOR) 40 MG tablet Take 1 tablet (40 mg total) by mouth at bedtime. 90 tablet 3  . tamsulosin (FLOMAX) 0.4 MG CAPS capsule Take 0.4 mg by mouth daily after breakfast.     . TRUE METRIX BLOOD GLUCOSE TEST test strip USE TO CHECK BLOOD SUGARS TWICE DAILY 200 each 3  . Vitamin D, Ergocalciferol, (DRISDOL) 50000 units CAPS capsule Take 1 capsule (50,000 Units total) by mouth every 7 (seven) days. 12 capsule 0  . cephALEXin (KEFLEX) 500 MG capsule Take 1 capsule (500 mg total) by mouth 4 (four) times daily. 120 capsule 4  . gabapentin (NEURONTIN) 300 MG capsule Take 300 mg by mouth 2  (two) times daily.     . Multiple Vitamin (MULTIVITAMIN WITH MINERALS) TABS tablet Take 1 tablet by mouth daily.     No facility-administered medications prior to visit.     ROS Review of Systems  Constitutional: Negative for chills, fatigue and fever.  HENT: Negative.  Negative for sinus pressure.   Eyes: Negative for visual disturbance.  Respiratory: Negative for cough, chest tightness, shortness of breath and wheezing.   Cardiovascular: Negative for chest pain, palpitations and leg swelling.  Gastrointestinal: Negative for abdominal pain, diarrhea, nausea and vomiting.  Endocrine: Negative.  Negative for polydipsia, polyphagia and polyuria.  Genitourinary: Negative.  Negative for difficulty urinating, dysuria, frequency and urgency.  Musculoskeletal: Positive for arthralgias. Negative for back pain and myalgias.  Skin: Negative.   Allergic/Immunologic: Negative.   Neurological: Negative.  Negative for dizziness and weakness.  Hematological: Negative for adenopathy. Does not bruise/bleed easily.  Psychiatric/Behavioral: Negative.     Objective:  BP 136/80 (BP Location: Left Arm, Patient Position: Sitting, Cuff Size: Normal)   Pulse 73   Temp 98.1 F (36.7 C) (Oral)   Resp 16   Ht 5\' 11"  (1.803 m)   Wt 183 lb 4 oz (83.1 kg)   SpO2 97%   BMI 25.56 kg/m   BP Readings from Last 3 Encounters:  02/08/17  136/80  02/08/17 136/80  01/11/17 132/86    Wt Readings from Last 3 Encounters:  02/08/17 183 lb (83 kg)  02/08/17 183 lb 4 oz (83.1 kg)  01/11/17 186 lb (84.4 kg)    Physical Exam  Constitutional: He is oriented to person, place, and time. No distress.  HENT:  Mouth/Throat: Oropharynx is clear and moist. No oropharyngeal exudate.  Eyes: Conjunctivae are normal. Right eye exhibits no discharge. Left eye exhibits no discharge. No scleral icterus.  Neck: Normal range of motion. Neck supple. No JVD present. No tracheal deviation present. No thyromegaly present.    Cardiovascular: Normal rate, regular rhythm, normal heart sounds and intact distal pulses.  Exam reveals no gallop and no friction rub.   No murmur heard. Pulmonary/Chest: Effort normal and breath sounds normal. No stridor. No respiratory distress. He has no wheezes. He has no rales. He exhibits no tenderness.  Abdominal: Soft. Bowel sounds are normal. He exhibits no distension and no mass. There is no tenderness. There is no rebound and no guarding.  Musculoskeletal: Normal range of motion. He exhibits no edema, tenderness or deformity.       Feet:  Lymphadenopathy:    He has no cervical adenopathy.  Neurological: He is alert and oriented to person, place, and time.  Skin: Skin is warm and dry. No rash noted. He is not diaphoretic. No erythema. No pallor.  Vitals reviewed.   Lab Results  Component Value Date   WBC 6.5 12/15/2016   HGB 13.3 12/15/2016   HCT 40.6 12/15/2016   PLT 187.0 12/15/2016   GLUCOSE 82 02/08/2017   CHOL 161 12/15/2016   TRIG 108.0 12/15/2016   HDL 52.10 12/15/2016   LDLDIRECT 118.0 11/11/2015   LDLCALC 87 12/15/2016   ALT 18 12/23/2016   AST 19 12/23/2016   NA 140 02/08/2017   K 4.3 02/08/2017   CL 104 02/08/2017   CREATININE 0.96 02/08/2017   BUN 16 02/08/2017   CO2 29 02/08/2017   TSH 0.67 09/08/2016   PSA 1.49 08/17/2016   INR 1.37 09/26/2016   HGBA1C 7.5 (H) 02/08/2017   MICROALBUR 1.0 02/08/2017    No results found.  Assessment & Plan:   Charles was seen today for diabetes.  Diagnoses and all orders for this visit:  Type 2 diabetes mellitus with complication, with long-term current use of insulin ([place])- his A1c is at 7.5% indicating his blood sugars are adequately well controlled. He does not have microalbuminemia and his renal function is normal. We will continue the current insulin regimen for blood sugar control. -     Basic metabolic panel; Future -     Hemoglobin A1c; Future -     Microalbumin / creatinine urine ratio; Future -      Ambulatory referral to Ophthalmology  Bunion of great toe of right foot -     Ambulatory referral to Orthopedic Surgery  Other orders -     cephALEXin (KEFLEX) 500 MG capsule; Take 1 capsule (500 mg total) by mouth 4 (four) times daily. -     gabapentin (NEURONTIN) 300 MG capsule; Take 1 capsule (300 mg total) by mouth 2 (two) times daily.   I have discontinued Mr. Parker's multivitamin with minerals. I have also changed his gabapentin. Additionally, I am having him maintain his tamsulosin, polyethylene glycol, insulin glargine, nitroGLYCERIN, insulin aspart, NIFEREX, TRUE METRIX BLOOD GLUCOSE TEST, Insulin Pen Needle, oxybutynin, rivaroxaban, simvastatin, Vitamin D (Ergocalciferol), and cephALEXin.  Meds ordered this encounter  Medications  .  cephALEXin (KEFLEX) 500 MG capsule    Sig: Take 1 capsule (500 mg total) by mouth 4 (four) times daily.    Dispense:  360 capsule    Refill:  1  . gabapentin (NEURONTIN) 300 MG capsule    Sig: Take 1 capsule (300 mg total) by mouth 2 (two) times daily.    Dispense:  180 capsule    Refill:  1     Follow-up: Return in about 4 months (around 06/11/2017).  Scarlette Calico, MD

## 2017-02-08 NOTE — Assessment & Plan Note (Signed)
Patient did well with aspiration. We'll continue conservative therapy. See him again in 6 weeks. Would be a candidate for viscous supplementation if needed.

## 2017-02-08 NOTE — Telephone Encounter (Signed)
Per MD to call and cancel script because he did not authorize med to be filled. Medication was originally rx by his infectious MD. [facility] spoke w/Abbie inform her to cancel script...Charles Parker

## 2017-02-09 ENCOUNTER — Other Ambulatory Visit: Payer: Self-pay | Admitting: Internal Medicine

## 2017-02-09 ENCOUNTER — Telehealth: Payer: Self-pay | Admitting: *Deleted

## 2017-02-09 ENCOUNTER — Other Ambulatory Visit: Payer: Self-pay | Admitting: Licensed Clinical Social Worker

## 2017-02-09 DIAGNOSIS — G4730 Sleep apnea, unspecified: Secondary | ICD-10-CM | POA: Diagnosis not present

## 2017-02-09 DIAGNOSIS — E119 Type 2 diabetes mellitus without complications: Secondary | ICD-10-CM | POA: Diagnosis not present

## 2017-02-09 DIAGNOSIS — M6281 Muscle weakness (generalized): Secondary | ICD-10-CM | POA: Diagnosis not present

## 2017-02-09 DIAGNOSIS — R2689 Other abnormalities of gait and mobility: Secondary | ICD-10-CM | POA: Diagnosis not present

## 2017-02-09 DIAGNOSIS — M4624 Osteomyelitis of vertebra, thoracic region: Secondary | ICD-10-CM | POA: Diagnosis not present

## 2017-02-09 DIAGNOSIS — I252 Old myocardial infarction: Secondary | ICD-10-CM | POA: Diagnosis not present

## 2017-02-09 DIAGNOSIS — I10 Essential (primary) hypertension: Secondary | ICD-10-CM | POA: Diagnosis not present

## 2017-02-09 DIAGNOSIS — M4620 Osteomyelitis of vertebra, site unspecified: Secondary | ICD-10-CM | POA: Diagnosis not present

## 2017-02-09 DIAGNOSIS — H409 Unspecified glaucoma: Secondary | ICD-10-CM | POA: Diagnosis not present

## 2017-02-09 DIAGNOSIS — D689 Coagulation defect, unspecified: Secondary | ICD-10-CM | POA: Diagnosis not present

## 2017-02-09 NOTE — Telephone Encounter (Signed)
Patient requesting refills of keflex four times daily. Please advise if he should still be on this. Landis Gandy, RN

## 2017-02-09 NOTE — Telephone Encounter (Signed)
One more month  thanks

## 2017-02-09 NOTE — Patient Outreach (Signed)
[place] [facility]) Care Management  02/09/2017  Charles Parker July 17, 1946 [id number]   Assessment- CSW completed call to patient and was able to reach patient. HIPPA verifications provided. CSW scheduled home visit for next week in order to complete SCAT application.  Plan-CSW will complete home visit next week.  Charles Parker, BSW, MSW, [email or web address] Phone: [phone] Fax: [phone]

## 2017-02-10 ENCOUNTER — Other Ambulatory Visit: Payer: Self-pay

## 2017-02-10 DIAGNOSIS — M4620 Osteomyelitis of vertebra, site unspecified: Secondary | ICD-10-CM | POA: Diagnosis not present

## 2017-02-10 NOTE — Patient Outreach (Signed)
    Telephone call made to patient to remind patient of our discharge visit planned for later this month. Patient advises this [name] he has been living back at his tome, independently. Patient has in home aide services, is completing HHPT services.   Plan: Home visit later this month to assess patient's progress in meeting his goals, discharge from caseload if

## 2017-02-10 NOTE — Telephone Encounter (Signed)
Sent 1 month refill. Thanks!

## 2017-02-11 ENCOUNTER — Other Ambulatory Visit: Payer: Self-pay | Admitting: Licensed Clinical Social Worker

## 2017-02-11 DIAGNOSIS — M4620 Osteomyelitis of vertebra, site unspecified: Secondary | ICD-10-CM | POA: Diagnosis not present

## 2017-02-11 NOTE — Patient Outreach (Signed)
Charles Parker) Care Management  02/11/2017  Charles Parker [birth year] [id number]  Assessment- CSW completed outreach call to patient and he answered. CSW questioned if she could complete home visit earlier next Tuesday and he was agreeable.  Plan-CSW will complete home visit next week and complete SCAT application to assist with gaining stable transportation.  Charles Parker, BSW, MSW, [email or web address] Phone: [phone] Fax: [phone]

## 2017-02-12 DIAGNOSIS — M4620 Osteomyelitis of vertebra, site unspecified: Secondary | ICD-10-CM | POA: Diagnosis not present

## 2017-02-13 DIAGNOSIS — M4620 Osteomyelitis of vertebra, site unspecified: Secondary | ICD-10-CM | POA: Diagnosis not present

## 2017-02-14 ENCOUNTER — Encounter (INDEPENDENT_AMBULATORY_CARE_PROVIDER_SITE_OTHER): Payer: Self-pay | Admitting: Orthopedic Surgery

## 2017-02-14 ENCOUNTER — Ambulatory Visit (INDEPENDENT_AMBULATORY_CARE_PROVIDER_SITE_OTHER): Payer: Medicare HMO

## 2017-02-14 ENCOUNTER — Ambulatory Visit: Payer: Medicare HMO | Admitting: Internal Medicine

## 2017-02-14 ENCOUNTER — Ambulatory Visit (INDEPENDENT_AMBULATORY_CARE_PROVIDER_SITE_OTHER): Payer: Medicare HMO | Admitting: Orthopedic Surgery

## 2017-02-14 DIAGNOSIS — M2021 Hallux rigidus, right foot: Secondary | ICD-10-CM | POA: Insufficient documentation

## 2017-02-14 DIAGNOSIS — D689 Coagulation defect, unspecified: Secondary | ICD-10-CM | POA: Diagnosis not present

## 2017-02-14 DIAGNOSIS — E119 Type 2 diabetes mellitus without complications: Secondary | ICD-10-CM | POA: Diagnosis not present

## 2017-02-14 DIAGNOSIS — M4620 Osteomyelitis of vertebra, site unspecified: Secondary | ICD-10-CM | POA: Diagnosis not present

## 2017-02-14 DIAGNOSIS — M6281 Muscle weakness (generalized): Secondary | ICD-10-CM | POA: Diagnosis not present

## 2017-02-14 DIAGNOSIS — M21611 Bunion of right foot: Secondary | ICD-10-CM

## 2017-02-14 DIAGNOSIS — I10 Essential (primary) hypertension: Secondary | ICD-10-CM | POA: Diagnosis not present

## 2017-02-14 DIAGNOSIS — M2022 Hallux rigidus, left foot: Secondary | ICD-10-CM

## 2017-02-14 DIAGNOSIS — R2689 Other abnormalities of gait and mobility: Secondary | ICD-10-CM | POA: Diagnosis not present

## 2017-02-14 DIAGNOSIS — M21612 Bunion of left foot: Secondary | ICD-10-CM

## 2017-02-14 DIAGNOSIS — H409 Unspecified glaucoma: Secondary | ICD-10-CM | POA: Diagnosis not present

## 2017-02-14 DIAGNOSIS — G4730 Sleep apnea, unspecified: Secondary | ICD-10-CM | POA: Diagnosis not present

## 2017-02-14 DIAGNOSIS — I252 Old myocardial infarction: Secondary | ICD-10-CM | POA: Diagnosis not present

## 2017-02-14 DIAGNOSIS — M4624 Osteomyelitis of vertebra, thoracic region: Secondary | ICD-10-CM | POA: Diagnosis not present

## 2017-02-14 NOTE — Progress Notes (Signed)
Office Visit Note   Patient: Charles Parker           Date of Birth: 1946/08/14           MRN: [id number] Visit Date: 02/14/2017              Requested by: Janith Lima, MD [address] [place], [place] [address] PCP: Janith Lima, MD  Chief Complaint  Patient presents with  . Left Foot - Pain  . Right Foot - Pain      HPI: Patient is a 71 year old gentleman complaining of the bunion deformity of both toes with overlapping of the second toe on top of the great toe bilaterally. Patient states since only been hurting him recently. Of note patient is diabetic uses insulin is not a smoker. Patient is status post infected discitis and status post debridement and fusion for his infected discitis by Dr. Cyndy Freeze  Assessment & Plan: Visit Diagnoses:  1. Bunion of great toe of left foot   2. Bunion of great toe of right foot     Plan: Recommended stiff soled walking shoe such as a new balance walking sneaker. Discussed that if his back is healed and he is still symptomatic we would need to consider fusion of the hallux rigidus 1 foot at a time with straightening of the medial column as well as a Weil osteotomy for the second metatarsal. Risk and benefits are discussed including infection nonhealing of the wound need for additional surgery.  Follow-Up Instructions: Return if symptoms worsen or fail to improve.   Ortho Exam  Patient is alert, oriented, no adenopathy, well-dressed, normal affect, normal respiratory effort. Examination patient is currently ambulating with the [employer] scooter secondary to his lumbar spine surgery. Several months out. Examination of both feet he is a good dorsalis pedis pulse bilaterally he has good range of motion of the ankle he has second toe overlapping the great toe bilaterally with hallux ridges bilaterally with essentially no motion of the MTP joint bilaterally. There is no redness no cellulitis no signs of gout.  Imaging: Xr Foot 2  Views Left  Result Date: 02/14/2017 Two-view radiographs of the left foot shows complete collapse of the MTP joint with periarticular bony spurs overlapping of the second toe on top of the great toe.  Xr Foot 2 Views Right  Result Date: 02/14/2017 Two-view radiographs of the right foot shows complete collapse across the MTP joint overlapping of the second toe on top of the great toe with a long second metatarsal.   Labs: Lab Results  Component Value Date   HGBA1C 7.5 (H) 02/08/2017   HGBA1C 7.3 (H) 10/21/2016   HGBA1C 12.6 08/17/2016   ESRSEDRATE 6 12/23/2016   ESRSEDRATE >140 (H) 10/03/2016   CRP 2.8 12/23/2016   CRP 8.1 (H) 10/03/2016   REPTSTATUS 10/27/2016 FINAL 10/22/2016   REPTSTATUS 10/27/2016 FINAL 10/22/2016   REPTSTATUS 10/27/2016 FINAL 10/22/2016   GRAMSTAIN  10/22/2016    FEW WBC PRESENT,BOTH PMN AND MONONUCLEAR NO ORGANISMS SEEN    GRAMSTAIN  10/22/2016    RARE WBC PRESENT,BOTH PMN AND MONONUCLEAR NO ORGANISMS SEEN    GRAMSTAIN  10/22/2016    RARE WBC PRESENT,BOTH PMN AND MONONUCLEAR NO ORGANISMS SEEN    CULT No growth aerobically or anaerobically. 10/22/2016   CULT No growth aerobically or anaerobically. 10/22/2016   CULT No growth aerobically or anaerobically. 10/22/2016   LABORGA ESCHERICHIA COLI 09/26/2016   LABORGA ESCHERICHIA COLI (A) 09/26/2016  Orders:  Orders Placed This Encounter  Procedures  . XR Foot 2 Views Left  . XR Foot 2 Views Right   No orders of the defined types were placed in this encounter.    Procedures: No procedures performed  Clinical Data: No additional findings.  ROS:  All other systems negative, except as noted in the HPI. Review of Systems  Objective: Vital Signs: There were no vitals taken for this visit.  Specialty Comments:  No specialty comments available.  PMFS History: Patient Active Problem List   Diagnosis Date Noted  . Bunion of great toe of left foot 02/14/2017  . Bunion of great toe of right  foot 02/08/2017  . Degenerative arthritis of left knee 01/11/2017  . OAB (overactive bladder) 01/09/2017  . Medication monitoring encounter 12/23/2016  . Pain in both knees 12/20/2016  . Iron deficiency 12/16/2016  . Type 2 diabetes mellitus with complication, with long-term current use of insulin ([place]) 12/15/2016  . Subacute osteomyelitis of thoracic spine ([place]) 10/22/2016  . Osteomyelitis of thoracic vertebra ([place]) 10/21/2016  . Hyperlipidemia LDL goal <70 10/21/2016  . Thoracic discitis 10/21/2016  . Abscess in epidural space of lumbar spine 10/21/2016  . Thoracic spine fracture ([place]) 10/21/2016  . DVT, lower extremity, proximal ([place]) 07/10/2014  . Benign prostatic hyperplasia (BPH) with straining on urination 06/20/2013  . 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 12/27/2011   Past Medical History:  Diagnosis Date  . Clotting disorder (HCC)    DVT  . Diabetes mellitus    type II  . Diverticulosis   . DVT (deep venous thrombosis) ([place])   . Glaucoma    no eye drops   . Hepatic cyst   . Hypercholesterolemia   . Hypertension   . Myocardial infarction ([place])    very mild long ago   . Renal cyst   . Sleep apnea   . Tubulovillous adenoma     Family History  Problem Relation Age of Onset  . Hypertension Mother   . Diabetes Mother   . Cancer Mother        ? location  . Colon cancer Father 52  . Colon polyps Neg Hx     Past Surgical History:  Procedure Laterality Date  . BLADDER NECK RECONSTRUCTION  02/03/2012   Procedure: BLADDER NECK REPAIR;  Surgeon: Adin Hector, MD;  Location: [facility] ORS;  Service: General;;  . COLONOSCOPY  02/2013   polyp removal(mult.)  . KNEE SURGERY  2004   left  . POLYPECTOMY    . PROCTOSCOPY  02/03/2012   Procedure: PROCTOSCOPY;  Surgeon: Adin Hector, MD;  Location: [facility] ORS;  Service: General;;  . STOMACH SURGERY  02/2012  . TEE WITHOUT CARDIOVERSION N/A 10/05/2016   Procedure:  TRANSESOPHAGEAL ECHOCARDIOGRAM (TEE);  Surgeon: Sanda Klein, MD;  Location: [facility] ENDOSCOPY;  Service: Cardiovascular;  Laterality: N/A;   Social History   Occupational History  . retired     Financial controller   Social History Main Topics  . Smoking status: Never Smoker  . Smokeless tobacco: Never Used  . Alcohol use No     Comment: once every two months  . Drug use: No  . Sexual activity: Not Currently

## 2017-02-15 ENCOUNTER — Other Ambulatory Visit: Payer: Self-pay | Admitting: Licensed Clinical Social Worker

## 2017-02-15 ENCOUNTER — Ambulatory Visit: Payer: Self-pay | Admitting: Licensed Clinical Social Worker

## 2017-02-15 DIAGNOSIS — M4620 Osteomyelitis of vertebra, site unspecified: Secondary | ICD-10-CM | POA: Diagnosis not present

## 2017-02-15 NOTE — Patient Outreach (Addendum)
[facility] [facility]) Care Management  [facility] Social Work  02/15/2017  DESHANNON HINCHLIFFE 03/30/1946 [id number]  Encounter Medications:  Outpatient Encounter Prescriptions as of 02/15/2017  Medication Sig  . cephALEXin (KEFLEX) 500 MG capsule TAKE ONE CAPSULE BY MOUTH FOUR TIMES DAILY UNTIL DISCONTINUED BY INFECTIOUS DISEASE  . gabapentin (NEURONTIN) 300 MG capsule Take 1 capsule (300 mg total) by mouth 2 (two) times daily.  . insulin aspart (NOVOLOG FLEXPEN) 100 UNIT/ML FlexPen Inject 10 Units into the skin 3 (three) times daily with meals.  . insulin glargine (LANTUS) 100 UNIT/ML injection Inject 25 Units into the skin at bedtime.  . Insulin Pen Needle 31G X 5 MM MISC Use with both insulin pens  . Iron Combinations (NIFEREX) TABS Take 1 tablet by mouth daily.  . nitroGLYCERIN (NITROSTAT) 0.4 MG SL tablet Place 1 tablet (0.4 mg total) under the tongue every 5 (five) minutes as needed for chest pain.  Marland Kitchen oxybutynin (DITROPAN) 5 MG tablet TAKE 1 TABLET(5 MG) BY MOUTH DAILY  . polyethylene glycol (MIRALAX / GLYCOLAX) packet Take 17 g by mouth 2 (two) times daily.  . rivaroxaban (XARELTO) 20 MG TABS tablet Take 1 tablet (20 mg total) by mouth daily with supper.  . simvastatin (ZOCOR) 40 MG tablet Take 1 tablet (40 mg total) by mouth at bedtime.  . tamsulosin (FLOMAX) 0.4 MG CAPS capsule Take 0.4 mg by mouth daily after breakfast.   . TRUE METRIX BLOOD GLUCOSE TEST test strip USE TO CHECK BLOOD SUGARS TWICE DAILY  . Vitamin D, Ergocalciferol, (DRISDOL) 50000 units CAPS capsule Take 1 capsule (50,000 Units total) by mouth every 7 (seven) days.   No facility-administered encounter medications on file as of 02/15/2017.     Functional Status:  In your present state of health, do you have any difficulty performing the following activities: 12/23/2016 12/13/2016  Hearing? N N  Vision? N Y  Difficulty concentrating or making decisions? N Y  Walking or climbing stairs? Y Y  Dressing or  bathing? Y Y  Doing errands, shopping? [name]  Preparing Food and eating ? - Y  Using the Toilet? - N  In the past six months, have you accidently leaked urine? - N  Do you have problems with loss of bowel control? - N  Managing your Medications? - Y  Managing your Finances? - Y  Housekeeping or managing your Housekeeping? - Y  Some recent data might be hidden    Fall/Depression Screening:  PHQ 2/9 Scores 02/15/2017 12/23/2016 12/21/2016 12/15/2016 12/13/2016 10/29/2016 10/08/2016  PHQ - 2 Score 0 0 0 0 0 0 0    Assessment: CSW completed routine home visit on 02/15/17 at patient's residence with new [place], patient and patient's temporarily aide. Patient wishes to gain SCAT transportation services in order to gain more independence and socialization. Patient shares that his walking has improved and that he continues to get physical therapy 2x per week. He shares that he walked "180 steps" the other day. Patient reports that his pain is at a 5 today. He shares that he had a friend pass away this week which has been difficult for him. CSW provided emotional support. Patient denies needing any grief support services at this time but was appreciative of social work information. Patient reports that his sister in law continues to fill his pill box weekly. He shares that he is going to church 2x per week now as he has started back in the church choir and  has choir rehearsals. Patient reports that his sleeping and eating habits are normal. He states that his old aide missed several days and that his brother [name] contacted the agency and expressed their concerns and then patient was provided with a new aide. Patient shares that the aide comes 7 days per week for a few hours.  Patient and family share that patient's PT wanted to meet with new aide in order to review patient's exercises. However, temporarily aide shares that if that is requested of family then they will need to contact agency and  ask to put task on care plan. Patient expressed understanding.  CSW completed entire SCAT application, educate patient on application process and successfully faxed application to SCAT on 1/61/09. CSW will monitor process and ensure that patient gains stable transportation.   THN CM Care Plan Problem One     Most Recent Value  Care Plan Problem One  SNF admission and lack of resources  Role Documenting the Problem One  Clinical Social Worker  Care Plan for Problem One  Active  THN Long Term Goal (31-90 days)  Patient will have a safe and stable discharging back home from SNF within 90 days per self report  [facility] Long Term Goal Start Date  10/08/16  [facility] Long Term Goal Met Date  01/26/17  Interventions for Problem One Long Term Goal  Goal met! Great progress towards having a smooth transition back home from SNF.  THN CM Short Term Goal #1 (0-30 days)  Patient will gain food stamps within 30 days as evidenced by checking his mail and informing CSW  [facility] CM Short Term Goal #1 Start Date  10/08/16  [facility] CM Short Term Goal #1 Met Date  11/17/16  Interventions for Short Term Goal #1  Goal met  THN CM Short Term Goal #2 (0-30 days)  Patient will attend all scheduled medical appointments within the next 30 days  THN CM Short Term Goal #2 Start Date  10/08/16  [facility] CM Short Term Goal #2 Met Date  11/17/16  Interventions for Short Term Goal #2  Goal successfully met.  THN CM Short Term Goal #3 (0-30 days)  Patient will create a safe plan for his transition back home within 30 days per family and patient report  THN CM Short Term Goal #3 Start Date  11/17/16  [facility] CM Short Term Goal #3 Met Date  12/15/16  Interventions for Short Tern Goal #3  Goal met.  THN CM Short Term Goal #4 (0-30 days)  Patient will be evaluated by PCS within 30 days per family report  THN CM Short Term Goal #4 Start Date  12/15/16  [facility] CM Short Term Goal #4 Met Date  01/06/17  Interventions for Short Term Goal #4  Goal met.  Evaluation completed on 01/06/17 and CSW was present for this.  THN CM Short Term Goal #5 (0-30 days)  Patient will choose personal care service agency within 30 days per family report  THN CM Short Term Goal #5 Start Date  01/06/17  [facility] CM Short Term Goal #5 Met Date  02/15/17  Interventions for Short Term Goal #5  Goal met. Patient has aide now.    THN CM Care Plan Problem Two     Most Recent Value  Care Plan Problem Two  Lack of overall resources and support within the home.  Role Documenting the Problem Two  Clinical Social Worker  Care Plan for Problem Two  Active  Interventions for Problem  [place] visit completed day and SCAT application was completed and faxed on 02/15/17. CSW will assist with this entire process.  THN Long Term Goal (31-90) days  Patient will gain SCAT transportation services within 90 days per family or patient report  THN Long Term Goal Start Date  01/26/17     Plan: CSW will follow up within 30 days. CSW successfully faxed SCAT application to office. CSW will continue to provide social work assistance and support.  [name], BSW, MSW, [email or web address] Phone: [phone] Fax: [phone]

## 2017-02-16 DIAGNOSIS — R2689 Other abnormalities of gait and mobility: Secondary | ICD-10-CM | POA: Diagnosis not present

## 2017-02-16 DIAGNOSIS — H409 Unspecified glaucoma: Secondary | ICD-10-CM | POA: Diagnosis not present

## 2017-02-16 DIAGNOSIS — I10 Essential (primary) hypertension: Secondary | ICD-10-CM | POA: Diagnosis not present

## 2017-02-16 DIAGNOSIS — G4730 Sleep apnea, unspecified: Secondary | ICD-10-CM | POA: Diagnosis not present

## 2017-02-16 DIAGNOSIS — M4620 Osteomyelitis of vertebra, site unspecified: Secondary | ICD-10-CM | POA: Diagnosis not present

## 2017-02-16 DIAGNOSIS — D689 Coagulation defect, unspecified: Secondary | ICD-10-CM | POA: Diagnosis not present

## 2017-02-16 DIAGNOSIS — M6281 Muscle weakness (generalized): Secondary | ICD-10-CM | POA: Diagnosis not present

## 2017-02-16 DIAGNOSIS — M4624 Osteomyelitis of vertebra, thoracic region: Secondary | ICD-10-CM | POA: Diagnosis not present

## 2017-02-16 DIAGNOSIS — I252 Old myocardial infarction: Secondary | ICD-10-CM | POA: Diagnosis not present

## 2017-02-16 DIAGNOSIS — E119 Type 2 diabetes mellitus without complications: Secondary | ICD-10-CM | POA: Diagnosis not present

## 2017-02-17 DIAGNOSIS — M4620 Osteomyelitis of vertebra, site unspecified: Secondary | ICD-10-CM | POA: Diagnosis not present

## 2017-02-18 DIAGNOSIS — M4620 Osteomyelitis of vertebra, site unspecified: Secondary | ICD-10-CM | POA: Diagnosis not present

## 2017-02-19 DIAGNOSIS — M4620 Osteomyelitis of vertebra, site unspecified: Secondary | ICD-10-CM | POA: Diagnosis not present

## 2017-02-20 DIAGNOSIS — M4620 Osteomyelitis of vertebra, site unspecified: Secondary | ICD-10-CM | POA: Diagnosis not present

## 2017-02-21 DIAGNOSIS — M4624 Osteomyelitis of vertebra, thoracic region: Secondary | ICD-10-CM | POA: Diagnosis not present

## 2017-02-21 DIAGNOSIS — Z981 Arthrodesis status: Secondary | ICD-10-CM | POA: Diagnosis not present

## 2017-02-21 DIAGNOSIS — R2689 Other abnormalities of gait and mobility: Secondary | ICD-10-CM | POA: Diagnosis not present

## 2017-02-21 DIAGNOSIS — D689 Coagulation defect, unspecified: Secondary | ICD-10-CM | POA: Diagnosis not present

## 2017-02-21 DIAGNOSIS — E119 Type 2 diabetes mellitus without complications: Secondary | ICD-10-CM | POA: Diagnosis not present

## 2017-02-21 DIAGNOSIS — E139 Other specified diabetes mellitus without complications: Secondary | ICD-10-CM | POA: Diagnosis not present

## 2017-02-21 DIAGNOSIS — G062 Extradural and subdural abscess, unspecified: Secondary | ICD-10-CM | POA: Diagnosis not present

## 2017-02-21 DIAGNOSIS — I10 Essential (primary) hypertension: Secondary | ICD-10-CM | POA: Diagnosis not present

## 2017-02-21 DIAGNOSIS — G4730 Sleep apnea, unspecified: Secondary | ICD-10-CM | POA: Diagnosis not present

## 2017-02-21 DIAGNOSIS — H409 Unspecified glaucoma: Secondary | ICD-10-CM | POA: Diagnosis not present

## 2017-02-21 DIAGNOSIS — I252 Old myocardial infarction: Secondary | ICD-10-CM | POA: Diagnosis not present

## 2017-02-21 DIAGNOSIS — M4620 Osteomyelitis of vertebra, site unspecified: Secondary | ICD-10-CM | POA: Diagnosis not present

## 2017-02-21 DIAGNOSIS — M6281 Muscle weakness (generalized): Secondary | ICD-10-CM | POA: Diagnosis not present

## 2017-02-21 DIAGNOSIS — M4324 Fusion of spine, thoracic region: Secondary | ICD-10-CM | POA: Diagnosis not present

## 2017-02-22 DIAGNOSIS — M4620 Osteomyelitis of vertebra, site unspecified: Secondary | ICD-10-CM | POA: Diagnosis not present

## 2017-02-23 DIAGNOSIS — I252 Old myocardial infarction: Secondary | ICD-10-CM | POA: Diagnosis not present

## 2017-02-23 DIAGNOSIS — M4620 Osteomyelitis of vertebra, site unspecified: Secondary | ICD-10-CM | POA: Diagnosis not present

## 2017-02-23 DIAGNOSIS — M4624 Osteomyelitis of vertebra, thoracic region: Secondary | ICD-10-CM | POA: Diagnosis not present

## 2017-02-23 DIAGNOSIS — R2689 Other abnormalities of gait and mobility: Secondary | ICD-10-CM | POA: Diagnosis not present

## 2017-02-23 DIAGNOSIS — E119 Type 2 diabetes mellitus without complications: Secondary | ICD-10-CM | POA: Diagnosis not present

## 2017-02-23 DIAGNOSIS — I10 Essential (primary) hypertension: Secondary | ICD-10-CM | POA: Diagnosis not present

## 2017-02-23 DIAGNOSIS — H409 Unspecified glaucoma: Secondary | ICD-10-CM | POA: Diagnosis not present

## 2017-02-23 DIAGNOSIS — M6281 Muscle weakness (generalized): Secondary | ICD-10-CM | POA: Diagnosis not present

## 2017-02-23 DIAGNOSIS — D689 Coagulation defect, unspecified: Secondary | ICD-10-CM | POA: Diagnosis not present

## 2017-02-23 DIAGNOSIS — G4730 Sleep apnea, unspecified: Secondary | ICD-10-CM | POA: Diagnosis not present

## 2017-02-24 ENCOUNTER — Other Ambulatory Visit: Payer: Self-pay

## 2017-02-24 DIAGNOSIS — M4620 Osteomyelitis of vertebra, site unspecified: Secondary | ICD-10-CM | POA: Diagnosis not present

## 2017-02-27 DIAGNOSIS — M4620 Osteomyelitis of vertebra, site unspecified: Secondary | ICD-10-CM | POA: Diagnosis not present

## 2017-02-28 DIAGNOSIS — M4620 Osteomyelitis of vertebra, site unspecified: Secondary | ICD-10-CM | POA: Diagnosis not present

## 2017-02-28 DIAGNOSIS — I252 Old myocardial infarction: Secondary | ICD-10-CM | POA: Diagnosis not present

## 2017-02-28 DIAGNOSIS — I10 Essential (primary) hypertension: Secondary | ICD-10-CM | POA: Diagnosis not present

## 2017-02-28 DIAGNOSIS — R2689 Other abnormalities of gait and mobility: Secondary | ICD-10-CM | POA: Diagnosis not present

## 2017-02-28 DIAGNOSIS — M6281 Muscle weakness (generalized): Secondary | ICD-10-CM | POA: Diagnosis not present

## 2017-02-28 DIAGNOSIS — G4730 Sleep apnea, unspecified: Secondary | ICD-10-CM | POA: Diagnosis not present

## 2017-02-28 DIAGNOSIS — H409 Unspecified glaucoma: Secondary | ICD-10-CM | POA: Diagnosis not present

## 2017-02-28 DIAGNOSIS — E119 Type 2 diabetes mellitus without complications: Secondary | ICD-10-CM | POA: Diagnosis not present

## 2017-02-28 DIAGNOSIS — D689 Coagulation defect, unspecified: Secondary | ICD-10-CM | POA: Diagnosis not present

## 2017-02-28 DIAGNOSIS — M4624 Osteomyelitis of vertebra, thoracic region: Secondary | ICD-10-CM | POA: Diagnosis not present

## 2017-03-01 ENCOUNTER — Telehealth: Payer: Self-pay | Admitting: Internal Medicine

## 2017-03-01 ENCOUNTER — Other Ambulatory Visit: Payer: Self-pay | Admitting: Internal Medicine

## 2017-03-01 DIAGNOSIS — R3916 Straining to void: Principal | ICD-10-CM

## 2017-03-01 DIAGNOSIS — M4624 Osteomyelitis of vertebra, thoracic region: Secondary | ICD-10-CM | POA: Diagnosis not present

## 2017-03-01 DIAGNOSIS — R2689 Other abnormalities of gait and mobility: Secondary | ICD-10-CM | POA: Diagnosis not present

## 2017-03-01 DIAGNOSIS — E119 Type 2 diabetes mellitus without complications: Secondary | ICD-10-CM | POA: Diagnosis not present

## 2017-03-01 DIAGNOSIS — E611 Iron deficiency: Secondary | ICD-10-CM

## 2017-03-01 DIAGNOSIS — G4730 Sleep apnea, unspecified: Secondary | ICD-10-CM | POA: Diagnosis not present

## 2017-03-01 DIAGNOSIS — I252 Old myocardial infarction: Secondary | ICD-10-CM | POA: Diagnosis not present

## 2017-03-01 DIAGNOSIS — D689 Coagulation defect, unspecified: Secondary | ICD-10-CM | POA: Diagnosis not present

## 2017-03-01 DIAGNOSIS — H409 Unspecified glaucoma: Secondary | ICD-10-CM | POA: Diagnosis not present

## 2017-03-01 DIAGNOSIS — Z981 Arthrodesis status: Secondary | ICD-10-CM | POA: Diagnosis not present

## 2017-03-01 DIAGNOSIS — Z794 Long term (current) use of insulin: Secondary | ICD-10-CM | POA: Diagnosis not present

## 2017-03-01 DIAGNOSIS — Z7901 Long term (current) use of anticoagulants: Secondary | ICD-10-CM | POA: Diagnosis not present

## 2017-03-01 DIAGNOSIS — D539 Nutritional anemia, unspecified: Secondary | ICD-10-CM

## 2017-03-01 DIAGNOSIS — M4620 Osteomyelitis of vertebra, site unspecified: Secondary | ICD-10-CM | POA: Diagnosis not present

## 2017-03-01 DIAGNOSIS — N401 Enlarged prostate with lower urinary tract symptoms: Secondary | ICD-10-CM

## 2017-03-01 DIAGNOSIS — M6281 Muscle weakness (generalized): Secondary | ICD-10-CM | POA: Diagnosis not present

## 2017-03-01 DIAGNOSIS — I10 Essential (primary) hypertension: Secondary | ICD-10-CM | POA: Diagnosis not present

## 2017-03-01 MED ORDER — NIFEREX PO TABS: 1.0000 | ORAL_TABLET | Freq: Every day | ORAL | 1 refills | Status: DC

## 2017-03-01 MED ORDER — TAMSULOSIN HCL 0.4 MG PO CAPS: 0.4000 mg | ORAL_CAPSULE | Freq: Every day | ORAL | 1 refills | Status: DC

## 2017-03-01 NOTE — Telephone Encounter (Signed)
Pt is requesting a refill on Tamsulosin 0.4 MG and Niferex tabs (this was previously given as a sample but we are out at this time). The pt is using [facility] on [address]. Please advise.

## 2017-03-02 DIAGNOSIS — M4624 Osteomyelitis of vertebra, thoracic region: Secondary | ICD-10-CM | POA: Diagnosis not present

## 2017-03-02 DIAGNOSIS — D689 Coagulation defect, unspecified: Secondary | ICD-10-CM | POA: Diagnosis not present

## 2017-03-02 DIAGNOSIS — M4620 Osteomyelitis of vertebra, site unspecified: Secondary | ICD-10-CM | POA: Diagnosis not present

## 2017-03-02 DIAGNOSIS — E119 Type 2 diabetes mellitus without complications: Secondary | ICD-10-CM | POA: Diagnosis not present

## 2017-03-02 DIAGNOSIS — M6281 Muscle weakness (generalized): Secondary | ICD-10-CM | POA: Diagnosis not present

## 2017-03-02 DIAGNOSIS — G4730 Sleep apnea, unspecified: Secondary | ICD-10-CM | POA: Diagnosis not present

## 2017-03-02 DIAGNOSIS — I252 Old myocardial infarction: Secondary | ICD-10-CM | POA: Diagnosis not present

## 2017-03-02 DIAGNOSIS — R2689 Other abnormalities of gait and mobility: Secondary | ICD-10-CM | POA: Diagnosis not present

## 2017-03-02 DIAGNOSIS — I10 Essential (primary) hypertension: Secondary | ICD-10-CM | POA: Diagnosis not present

## 2017-03-02 DIAGNOSIS — H409 Unspecified glaucoma: Secondary | ICD-10-CM | POA: Diagnosis not present

## 2017-03-03 ENCOUNTER — Other Ambulatory Visit: Payer: Self-pay | Admitting: Internal Medicine

## 2017-03-03 DIAGNOSIS — M4620 Osteomyelitis of vertebra, site unspecified: Secondary | ICD-10-CM | POA: Diagnosis not present

## 2017-03-03 DIAGNOSIS — E611 Iron deficiency: Secondary | ICD-10-CM

## 2017-03-03 MED ORDER — FERROUS SULFATE 325 (65 FE) MG PO TABS: 325.0000 mg | ORAL_TABLET | Freq: Two times a day (BID) | ORAL | 1 refills | Status: DC

## 2017-03-04 ENCOUNTER — Other Ambulatory Visit: Payer: Self-pay | Admitting: Licensed Clinical Social Worker

## 2017-03-04 DIAGNOSIS — M4620 Osteomyelitis of vertebra, site unspecified: Secondary | ICD-10-CM | POA: Diagnosis not present

## 2017-03-04 NOTE — Patient Outreach (Signed)
[facility] [facility]) Care Management  03/04/2017  Charles Parker June 30, 1946 [id number]  Assessment- CSW completed outreach call to patient and he answered. CSW questioned if he had scheduled SCAT assessment appointment yet and he reported that he did not receive a call yet. CSW questioned if he had received anything in the mail from [place] and he shared that his brother usually checks the mailbox and he is unsure. CSW informed patient that she will follow up with SCAT on this and he expressed understanding.  CSW sent secure email to SCAT inquiring about patient needing an appointment scheduled. However, patient's brother contacted CSW later and informed her that he scheduled appointment for patient for 03/09/17 at 10 am. SCAT assessment appointment scheduled successfully.  Plan-CSW will follow up within three weeks and continue to provide social work assistance.  [name], BSW, MSW, [email or web address] Phone: [phone] Fax: [phone]

## 2017-03-05 DIAGNOSIS — M4620 Osteomyelitis of vertebra, site unspecified: Secondary | ICD-10-CM | POA: Diagnosis not present

## 2017-03-06 DIAGNOSIS — M4620 Osteomyelitis of vertebra, site unspecified: Secondary | ICD-10-CM | POA: Diagnosis not present

## 2017-03-07 DIAGNOSIS — D689 Coagulation defect, unspecified: Secondary | ICD-10-CM | POA: Diagnosis not present

## 2017-03-07 DIAGNOSIS — E119 Type 2 diabetes mellitus without complications: Secondary | ICD-10-CM | POA: Diagnosis not present

## 2017-03-07 DIAGNOSIS — I252 Old myocardial infarction: Secondary | ICD-10-CM | POA: Diagnosis not present

## 2017-03-07 DIAGNOSIS — M6281 Muscle weakness (generalized): Secondary | ICD-10-CM | POA: Diagnosis not present

## 2017-03-07 DIAGNOSIS — M4624 Osteomyelitis of vertebra, thoracic region: Secondary | ICD-10-CM | POA: Diagnosis not present

## 2017-03-07 DIAGNOSIS — M4620 Osteomyelitis of vertebra, site unspecified: Secondary | ICD-10-CM | POA: Diagnosis not present

## 2017-03-07 DIAGNOSIS — R2689 Other abnormalities of gait and mobility: Secondary | ICD-10-CM | POA: Diagnosis not present

## 2017-03-07 DIAGNOSIS — I10 Essential (primary) hypertension: Secondary | ICD-10-CM | POA: Diagnosis not present

## 2017-03-07 DIAGNOSIS — H409 Unspecified glaucoma: Secondary | ICD-10-CM | POA: Diagnosis not present

## 2017-03-07 DIAGNOSIS — G4730 Sleep apnea, unspecified: Secondary | ICD-10-CM | POA: Diagnosis not present

## 2017-03-08 ENCOUNTER — Other Ambulatory Visit: Payer: Self-pay | Admitting: *Deleted

## 2017-03-08 DIAGNOSIS — M4620 Osteomyelitis of vertebra, site unspecified: Secondary | ICD-10-CM | POA: Diagnosis not present

## 2017-03-08 MED ORDER — INSULIN GLARGINE 100 UNIT/ML ~~LOC~~ SOLN: 25.0000 [IU] | Freq: Every day | SUBCUTANEOUS | 5 refills | Status: DC

## 2017-03-08 NOTE — Telephone Encounter (Signed)
High Potency Iron 325mg  was sent in to the pharmacy. Is this taking the place of the Niferex? Please advise.

## 2017-03-08 NOTE — Telephone Encounter (Signed)
Left  msg on triage requesting refill on his Lantus to be sent to [facility].Verified chart pt is up-to-date sent rx [facility] to [facility]...Charles Parker

## 2017-03-09 DIAGNOSIS — M4620 Osteomyelitis of vertebra, site unspecified: Secondary | ICD-10-CM | POA: Diagnosis not present

## 2017-03-09 NOTE — Telephone Encounter (Signed)
Notified pt yes since the insurance wouldn't cover the Niferex MD sent tx for ferrous sulfate which is iron supplement to take instead...Charles Parker

## 2017-03-10 ENCOUNTER — Other Ambulatory Visit: Payer: Self-pay | Admitting: Internal Medicine

## 2017-03-10 DIAGNOSIS — M6281 Muscle weakness (generalized): Secondary | ICD-10-CM | POA: Diagnosis not present

## 2017-03-10 DIAGNOSIS — E119 Type 2 diabetes mellitus without complications: Secondary | ICD-10-CM | POA: Diagnosis not present

## 2017-03-10 DIAGNOSIS — M4624 Osteomyelitis of vertebra, thoracic region: Secondary | ICD-10-CM | POA: Diagnosis not present

## 2017-03-10 DIAGNOSIS — I252 Old myocardial infarction: Secondary | ICD-10-CM | POA: Diagnosis not present

## 2017-03-10 DIAGNOSIS — G4730 Sleep apnea, unspecified: Secondary | ICD-10-CM | POA: Diagnosis not present

## 2017-03-10 DIAGNOSIS — R2689 Other abnormalities of gait and mobility: Secondary | ICD-10-CM | POA: Diagnosis not present

## 2017-03-10 DIAGNOSIS — H409 Unspecified glaucoma: Secondary | ICD-10-CM | POA: Diagnosis not present

## 2017-03-10 DIAGNOSIS — I10 Essential (primary) hypertension: Secondary | ICD-10-CM | POA: Diagnosis not present

## 2017-03-10 DIAGNOSIS — D689 Coagulation defect, unspecified: Secondary | ICD-10-CM | POA: Diagnosis not present

## 2017-03-10 DIAGNOSIS — M4620 Osteomyelitis of vertebra, site unspecified: Secondary | ICD-10-CM | POA: Diagnosis not present

## 2017-03-11 ENCOUNTER — Other Ambulatory Visit: Payer: Self-pay | Admitting: Licensed Clinical Social Worker

## 2017-03-11 DIAGNOSIS — M4620 Osteomyelitis of vertebra, site unspecified: Secondary | ICD-10-CM | POA: Diagnosis not present

## 2017-03-11 NOTE — Patient Outreach (Signed)
[facility]) Care Management  03/11/2017  LACHLAN MCKIM 28-Aug-1946 [id number]  Assessment- CSW completed outreach call to patient to follow up on SCAT services. Patient did not answer. CSW left a HIPPA compliant voice message encouraging return call. CSW included on voice message that she would be out of the office until 03/22/17.  Plan-CSW will follow up within three weeks and will continue to provide social work assistance and support.  [name], BSW, MSW, [email or web address] Phone: [phone] Fax: [phone]

## 2017-03-12 DIAGNOSIS — M4620 Osteomyelitis of vertebra, site unspecified: Secondary | ICD-10-CM | POA: Diagnosis not present

## 2017-03-13 DIAGNOSIS — M4620 Osteomyelitis of vertebra, site unspecified: Secondary | ICD-10-CM | POA: Diagnosis not present

## 2017-03-14 DIAGNOSIS — M4620 Osteomyelitis of vertebra, site unspecified: Secondary | ICD-10-CM | POA: Diagnosis not present

## 2017-03-15 DIAGNOSIS — H40013 Open angle with borderline findings, low risk, bilateral: Secondary | ICD-10-CM | POA: Diagnosis not present

## 2017-03-15 DIAGNOSIS — E119 Type 2 diabetes mellitus without complications: Secondary | ICD-10-CM | POA: Diagnosis not present

## 2017-03-15 DIAGNOSIS — H1711 Central corneal opacity, right eye: Secondary | ICD-10-CM | POA: Diagnosis not present

## 2017-03-15 DIAGNOSIS — M4620 Osteomyelitis of vertebra, site unspecified: Secondary | ICD-10-CM | POA: Diagnosis not present

## 2017-03-15 DIAGNOSIS — H2513 Age-related nuclear cataract, bilateral: Secondary | ICD-10-CM | POA: Diagnosis not present

## 2017-03-15 LAB — HM DIABETES EYE EXAM

## 2017-03-16 DIAGNOSIS — M4624 Osteomyelitis of vertebra, thoracic region: Secondary | ICD-10-CM | POA: Diagnosis not present

## 2017-03-16 DIAGNOSIS — M6281 Muscle weakness (generalized): Secondary | ICD-10-CM | POA: Diagnosis not present

## 2017-03-16 DIAGNOSIS — I252 Old myocardial infarction: Secondary | ICD-10-CM | POA: Diagnosis not present

## 2017-03-16 DIAGNOSIS — E119 Type 2 diabetes mellitus without complications: Secondary | ICD-10-CM | POA: Diagnosis not present

## 2017-03-16 DIAGNOSIS — D689 Coagulation defect, unspecified: Secondary | ICD-10-CM | POA: Diagnosis not present

## 2017-03-16 DIAGNOSIS — G4730 Sleep apnea, unspecified: Secondary | ICD-10-CM | POA: Diagnosis not present

## 2017-03-16 DIAGNOSIS — H409 Unspecified glaucoma: Secondary | ICD-10-CM | POA: Diagnosis not present

## 2017-03-16 DIAGNOSIS — R2689 Other abnormalities of gait and mobility: Secondary | ICD-10-CM | POA: Diagnosis not present

## 2017-03-16 DIAGNOSIS — I10 Essential (primary) hypertension: Secondary | ICD-10-CM | POA: Diagnosis not present

## 2017-03-16 DIAGNOSIS — M4620 Osteomyelitis of vertebra, site unspecified: Secondary | ICD-10-CM | POA: Diagnosis not present

## 2017-03-16 LAB — HM DIABETES EYE EXAM

## 2017-03-17 DIAGNOSIS — M4620 Osteomyelitis of vertebra, site unspecified: Secondary | ICD-10-CM | POA: Diagnosis not present

## 2017-03-19 DIAGNOSIS — M4620 Osteomyelitis of vertebra, site unspecified: Secondary | ICD-10-CM | POA: Diagnosis not present

## 2017-03-21 DIAGNOSIS — M4620 Osteomyelitis of vertebra, site unspecified: Secondary | ICD-10-CM | POA: Diagnosis not present

## 2017-03-21 NOTE — Progress Notes (Signed)
[facility] [address], [place] [address] Phone: [phone] Subjective:    I'm seeing this patient by the request  of:  Janith Lima, MD   CC: Left knee pain  KGM:[id number]  Charles Parker is a 71 y.o. male coming in with complaint of left knee pain Patient is having difficulty with this pain for some time. Past medical history is also significant for osteomyelitis of the thoracic vertebrae. Patient did need to have a thoracic fusion secondary to decompression.     Patient states doing very well. Has been even walking without the walker. Patient denies any swelling, denies any instability when he is wearing the brace. Feeling very good overall.     Past Medical History:  Diagnosis Date  . Clotting disorder (HCC)    DVT  . Diabetes mellitus    type II  . Diverticulosis   . DVT (deep venous thrombosis) ([place])   . Glaucoma    no eye drops   . Hepatic cyst   . Hypercholesterolemia   . Hypertension   . Myocardial infarction ([place])    very mild long ago   . Renal cyst   . Sleep apnea   . Tubulovillous adenoma    Past Surgical History:  Procedure Laterality Date  . BLADDER NECK RECONSTRUCTION  02/03/2012   Procedure: BLADDER NECK REPAIR;  Surgeon: Adin Hector, MD;  Location: [facility];  Service: General;;  . COLONOSCOPY  02/2013   polyp removal(mult.)  . KNEE SURGERY  2004   left  . POLYPECTOMY    . PROCTOSCOPY  02/03/2012   Procedure: PROCTOSCOPY;  Surgeon: Adin Hector, MD;  Location: [facility];  Service: General;;  . STOMACH SURGERY  02/2012  . TEE WITHOUT CARDIOVERSION N/A 10/05/2016   Procedure: TRANSESOPHAGEAL ECHOCARDIOGRAM (TEE);  Surgeon: Sanda Klein, MD;  Location: [facility] ENDOSCOPY;  Service: Cardiovascular;  Laterality: N/A;   Social History   Social History  . Marital status: Widowed    Spouse name: N/A  . Number of children: 2  . Years of education: 12   Occupational History  . retired     Financial controller   Social  History Main Topics  . Smoking status: Never Smoker  . Smokeless tobacco: Never Used  . Alcohol use No     Comment: once every two months  . Drug use: No  . Sexual activity: Not Currently   Other Topics Concern  . None   Social History Narrative   Patient lives at home alone.. Patient is retired. Patient has high school education.   Right handed.- Both   Caffeine- Coffee and soda   Allergies  Allergen Reactions  . Metformin And Related Diarrhea  . Codeine Rash    All over the body   Family History  Problem Relation Age of Onset  . Hypertension Mother   . Diabetes Mother   . Cancer Mother        ? location  . Colon cancer Father 86  . Colon polyps Neg Hx     Past medical history, social, surgical and family history all reviewed in electronic medical record.  No pertanent information unless stated regarding to the chief complaint.   Review of Systems: No headache, visual changes, nausea, vomiting, diarrhea, constipation, dizziness, abdominal pain, skin rash, fevers, chills, night sweats, weight loss, swollen lymph nodes, chest pain, shortness of breath, mood changes.  Mild body aches and muscle aches   Objective  Blood  pressure 128/84, pulse 62, height 5\' 11"  (1.803 m), weight 191 lb (86.6 kg), SpO2 99 %.   Systems examined below as of 03/22/17 General: NAD A&O x3 mood, affect normal  HEENT: Pupils equal, extraocular movements intact no nystagmus Respiratory: not short of breath at rest or with speaking Cardiovascular: No lower extremity edema, non tender Skin: Warm dry intact with no signs of infection or rash on extremities or on axial skeleton. Abdomen: Soft nontender, no masses Neuro: Cranial nerves  intact, neurovascularly intact in all extremities with 2+ DTRs and 2+ pulses. Lymph: No lymphadenopathy appreciated today  Gait Improvement in gait but still antalgic MSK: Non tender with full range of motion and good stability and symmetric strength and tone of  shoulders, elbows, wrist,  knee hips and ankles bilaterally.  Arthritic changes of multiple joints  Knee: Left valgus deformity noted. Large thigh to calf ratio. No effusion noted Minimal tenderness Near full range of motion instability with valgus force.  Patellar glide with moderate crepitus. Patellar and quadriceps tendons unremarkable. 4+ out of 5 strength compared to the contralateral side. Stable from previous exam Contralateral knee shows arthritic changes but no pain     Impression and Recommendations:     This case required medical decision making of moderate complexity.      Note: This dictation was prepared with Dragon dictation along with smaller phrase technology. Any transcriptional errors that result from this process are unintentional.

## 2017-03-22 ENCOUNTER — Encounter: Payer: Self-pay | Admitting: Family Medicine

## 2017-03-22 ENCOUNTER — Ambulatory Visit (INDEPENDENT_AMBULATORY_CARE_PROVIDER_SITE_OTHER): Payer: Medicare HMO | Admitting: Family Medicine

## 2017-03-22 DIAGNOSIS — M1712 Unilateral primary osteoarthritis, left knee: Secondary | ICD-10-CM | POA: Diagnosis not present

## 2017-03-22 DIAGNOSIS — M4620 Osteomyelitis of vertebra, site unspecified: Secondary | ICD-10-CM | POA: Diagnosis not present

## 2017-03-22 MED ORDER — VITAMIN D (ERGOCALCIFEROL) 1.25 MG (50000 UNIT) PO CAPS: 50000.0000 [IU] | ORAL_CAPSULE | ORAL | 0 refills | Status: DC

## 2017-03-22 NOTE — Patient Instructions (Signed)
Good to see you  You look great Keep it up  Vitamin D once weekly still and for your brother consider 2000-5000IU daily  See me when you need me.

## 2017-03-22 NOTE — Assessment & Plan Note (Signed)
Patient is doing relatively well at this point. We discussed icing regimen and home exercises. Discussed continuing the same conservative therapy as well as patient does well. Worsening symptoms come back sooner.

## 2017-03-23 ENCOUNTER — Other Ambulatory Visit: Payer: Self-pay | Admitting: Licensed Clinical Social Worker

## 2017-03-23 DIAGNOSIS — R2689 Other abnormalities of gait and mobility: Secondary | ICD-10-CM | POA: Diagnosis not present

## 2017-03-23 DIAGNOSIS — H409 Unspecified glaucoma: Secondary | ICD-10-CM | POA: Diagnosis not present

## 2017-03-23 DIAGNOSIS — E119 Type 2 diabetes mellitus without complications: Secondary | ICD-10-CM | POA: Diagnosis not present

## 2017-03-23 DIAGNOSIS — G4730 Sleep apnea, unspecified: Secondary | ICD-10-CM | POA: Diagnosis not present

## 2017-03-23 DIAGNOSIS — D689 Coagulation defect, unspecified: Secondary | ICD-10-CM | POA: Diagnosis not present

## 2017-03-23 DIAGNOSIS — M4624 Osteomyelitis of vertebra, thoracic region: Secondary | ICD-10-CM | POA: Diagnosis not present

## 2017-03-23 DIAGNOSIS — M4620 Osteomyelitis of vertebra, site unspecified: Secondary | ICD-10-CM | POA: Diagnosis not present

## 2017-03-23 DIAGNOSIS — I10 Essential (primary) hypertension: Secondary | ICD-10-CM | POA: Diagnosis not present

## 2017-03-23 DIAGNOSIS — M6281 Muscle weakness (generalized): Secondary | ICD-10-CM | POA: Diagnosis not present

## 2017-03-23 DIAGNOSIS — I252 Old myocardial infarction: Secondary | ICD-10-CM | POA: Diagnosis not present

## 2017-03-23 NOTE — Patient Outreach (Signed)
[facility]) Care Management  03/23/2017  Charles Parker May 28, 1946 [id number]  Assessment- CSW completed outreach call to patient and he answered. However, there was a lot of commotion in the background and patient could not hear CSW. He reports that he is currently at a "gathering" and suggested calling back another day. CSW informed him that she will call him within one week.  Plan-CSW will follow up with patient within one week.  [name], BSW, MSW, [email or web address] Phone: [phone] Fax: [phone]

## 2017-03-23 NOTE — Patient Outreach (Signed)
[place] [facility]) Care Management  03/23/2017  Charles Parker 11-Jun-1946 [id number]  Assessment- CSW received return call from patient later in the day. CSW questioned if she could complete home visit this week or the next and he is agreeable to home visit on 03/24/17 after his physician appointment.  Plan-CSW will complete home visit tomorrow and continue to provide social work support.  [name], BSW, MSW, [email or web address] Phone: [phone] Fax: [phone]

## 2017-03-24 ENCOUNTER — Ambulatory Visit (INDEPENDENT_AMBULATORY_CARE_PROVIDER_SITE_OTHER): Payer: Medicare HMO | Admitting: Internal Medicine

## 2017-03-24 ENCOUNTER — Encounter: Payer: Self-pay | Admitting: Internal Medicine

## 2017-03-24 ENCOUNTER — Other Ambulatory Visit: Payer: Self-pay | Admitting: Licensed Clinical Social Worker

## 2017-03-24 VITALS — BP 123/86 | HR 80 | Temp 98.4°F | Ht 71.0 in | Wt 188.0 lb

## 2017-03-24 DIAGNOSIS — Z981 Arthrodesis status: Secondary | ICD-10-CM | POA: Diagnosis not present

## 2017-03-24 DIAGNOSIS — G062 Extradural and subdural abscess, unspecified: Secondary | ICD-10-CM | POA: Diagnosis not present

## 2017-03-24 DIAGNOSIS — M4620 Osteomyelitis of vertebra, site unspecified: Secondary | ICD-10-CM | POA: Diagnosis not present

## 2017-03-24 DIAGNOSIS — Z5181 Encounter for therapeutic drug level monitoring: Secondary | ICD-10-CM

## 2017-03-24 DIAGNOSIS — M4324 Fusion of spine, thoracic region: Secondary | ICD-10-CM | POA: Diagnosis not present

## 2017-03-24 DIAGNOSIS — M4624 Osteomyelitis of vertebra, thoracic region: Secondary | ICD-10-CM | POA: Diagnosis not present

## 2017-03-24 DIAGNOSIS — R2689 Other abnormalities of gait and mobility: Secondary | ICD-10-CM | POA: Diagnosis not present

## 2017-03-24 DIAGNOSIS — E139 Other specified diabetes mellitus without complications: Secondary | ICD-10-CM | POA: Diagnosis not present

## 2017-03-24 LAB — COMPREHENSIVE METABOLIC PANEL
ALT: 30 U/L (ref 9–46)
AST: 23 U/L (ref 10–35)
Albumin: 4.3 g/dL (ref 3.6–5.1)
Alkaline Phosphatase: 65 U/L (ref 40–115)
BUN: 11 mg/dL (ref 7–25)
CHLORIDE: 105 mmol/L (ref 98–110)
CO2: 29 mmol/L (ref 20–31)
Calcium: 9.7 mg/dL (ref 8.6–10.3)
Creat: 1.04 mg/dL (ref 0.70–1.18)
GLUCOSE: 81 mg/dL (ref 65–99)
Potassium: 4.4 mmol/L (ref 3.5–5.3)
Sodium: 142 mmol/L (ref 135–146)
Total Bilirubin: 0.7 mg/dL (ref 0.2–1.2)
Total Protein: 7.2 g/dL (ref 6.1–8.1)

## 2017-03-24 LAB — CBC WITH DIFFERENTIAL/PLATELET
BASOS PCT: 1 %
Basophils Absolute: 42 cells/uL (ref 0–200)
EOS ABS: 84 {cells}/uL (ref 15–500)
Eosinophils Relative: 2 %
HCT: 44.5 % (ref 38.5–50.0)
Hemoglobin: 14.9 g/dL (ref 13.2–17.1)
LYMPHS PCT: 38 %
Lymphs Abs: 1596 cells/uL (ref 850–3900)
MCH: 30.4 pg (ref 27.0–33.0)
MCHC: 33.5 g/dL (ref 32.0–36.0)
MCV: 90.8 fL (ref 80.0–100.0)
MONOS PCT: 11 %
MPV: 9.2 fL (ref 7.5–12.5)
Monocytes Absolute: 462 cells/uL (ref 200–950)
Neutro Abs: 2016 cells/uL (ref 1500–7800)
Neutrophils Relative %: 48 %
PLATELETS: 154 10*3/uL (ref 140–400)
RBC: 4.9 MIL/uL (ref 4.20–5.80)
RDW: 15 % (ref 11.0–15.0)
WBC: 4.2 10*3/uL (ref 3.8–10.8)

## 2017-03-24 NOTE — Assessment & Plan Note (Signed)
He is doing well.  Last ESR and CRP wnl.  I will recheck today and if ok, will let him know he can stop the keflex once his current month runs out.  He will return PRN and call with any new concerns

## 2017-03-24 NOTE — Patient Outreach (Signed)
[facility]) Care Management  [facility] Social Work  03/24/2017  Charles Parker [birth year] [id number]  Encounter Medications:  Outpatient Encounter Prescriptions as of 03/24/2017  Medication Sig  . cephALEXin (KEFLEX) 500 MG capsule TAKE ONE CAPSULE BY MOUTH FOUR TIMES DAILY UNTIL DISCONTINUED BY INFECTIOUS DISEASE  . ferrous sulfate 325 (65 FE) MG tablet Take 1 tablet (325 mg total) by mouth 2 (two) times daily with a meal.  . gabapentin (NEURONTIN) 300 MG capsule Take 1 capsule (300 mg total) by mouth 2 (two) times daily.  . insulin aspart (NOVOLOG FLEXPEN) 100 UNIT/ML FlexPen Inject 10 Units into the skin 3 (three) times daily with meals.  . insulin glargine (LANTUS) 100 UNIT/ML injection Inject 0.25 mLs (25 Units total) into the skin at bedtime.  . Insulin Pen Needle 31G X 5 MM MISC Use with both insulin pens  . oxybutynin (DITROPAN) 5 MG tablet TAKE 1 TABLET(5 MG) BY MOUTH DAILY  . polyethylene glycol (MIRALAX / GLYCOLAX) packet Take 17 g by mouth 2 (two) times daily. (Patient not taking: Reported on 03/24/2017)  . rivaroxaban (XARELTO) 20 MG TABS tablet Take 1 tablet (20 mg total) by mouth daily with supper.  . simvastatin (ZOCOR) 40 MG tablet Take 1 tablet (40 mg total) by mouth at bedtime.  . tamsulosin (FLOMAX) 0.4 MG CAPS capsule Take 1 capsule (0.4 mg total) by mouth daily after breakfast.  . TRUE METRIX BLOOD GLUCOSE TEST test strip USE TO CHECK BLOOD SUGARS TWICE DAILY  . Vitamin D, Ergocalciferol, (DRISDOL) 50000 units CAPS capsule Take 1 capsule (50,000 Units total) by mouth every 7 (seven) days.   No facility-administered encounter medications on file as of 03/24/2017.     Functional Status:  In your present state of health, do you have any difficulty performing the following activities: 03/24/2017 12/23/2016  Hearing? N N  Vision? Y N  Difficulty concentrating or making decisions? N N  Walking or climbing stairs? Y Y  Dressing or bathing? Y Y  Doing  errands, shopping? Y Y  Preparing Food and eating ? - -  Using the Toilet? - -  In the past six months, have you accidently leaked urine? - -  Do you have problems with loss of bowel control? - -  Managing your Medications? - -  Managing your Finances? - -  Housekeeping or managing your Housekeeping? - -  Some recent data might be hidden    Fall/Depression Screening:  PHQ 2/9 Scores 03/24/2017 03/24/2017 02/15/2017 12/23/2016 12/21/2016 12/15/2016 12/13/2016  PHQ - 2 Score 0 0 0 0 0 0 0    Assessment: CSW completed routine home visit on 03/24/17 with patient and patient's brother [name] who arrived later during session. Patient reports that he was approved for SCAT services and has badge. He states that he has not had to use transportation service yet but is aware on how to use it. He shares that he has information packet on his table for when he needs to schedule transportation ride with SCAT. Patient reports that he has been implementing appropriate socialization into his routine such as: attending a birthday gathering yesterday, attending church weekly as well as their past and future events (yard sale this upcoming weekend.) Patient reports that he is interested in getting back involved with a bowling again but is aware that he cannot participate yet. He shared that he did go to a competition with a friend recently and was able to socialize and observe. Patient continues to get PT  but this will be ending in one week per family. Patient reports interest in going to the [facility] after PT ends. Patient is familiar with the [facility] and participated in the [employer] a few years ago. CSW reviewed resource and patient is agreeable to CSW mailing out [facility] calendar out to his residence. Patient denies having any recent falls and shares that he uses his walker "most of the time." He shares that his aide continues to come weekly and provide personal care. He denies  having any issues with sleep, appetite or pain. Patient shares that he is in need of new eyeglasses. He reports that he will be going to [place] today with his brother to see if he can gain a new pair. CSW educated family on the [employer] program and asked if they would prefer to get on wait list for program. Patient is unsure as he does not want to wait long to get a new pair of glasses. Patient will notify CSW if he cannot find eyeglasses and wishes to be put on wait list for [employer]. CSW will follow up within 30 days.  THN CM Care Plan Problem One     Most Recent Value  Care Plan Problem One  SNF admission and lack of resources  Role Documenting the Problem One  Clinical Social Worker  Care Plan for Problem One  Active  [facility] Long Term Goal   Patient will have a safe and stable discharging back home from SNF within 90 days per self report  [facility] Long Term Goal Start Date  10/08/16  [facility] Long Term Goal Met Date  01/26/17  Interventions for Problem One Long Term Goal  Goal met! Great progress towards having a smooth transition back home from SNF.  [facility]N CM Short Term Goal #1   Patient will gain food stamps within 30 days as evidenced by checking his mail and informing CSW  [facility] CM Short Term Goal #1 Start Date  10/08/16  [facility] CM Short Term Goal #1 Met Date  11/17/16  Interventions for Short Term Goal #1  Goal met  THN CM Short Term Goal #2   Patient will attend all scheduled medical appointments within the next 30 days  [facility]N CM Short Term Goal #2 Start Date  10/08/16  [facility] CM Short Term Goal #2 Met Date  11/17/16  Interventions for Short Term Goal #2  Goal successfully met.  THN CM Short Term Goal #3  Patient will create a safe plan for his transition back home within 30 days per family and patient report  THN CM Short Term Goal #3 Start Date  11/17/16  [facility] CM Short Term Goal #3 Met Date  12/15/16  Interventions for Short Tern Goal #3  Goal met.  THN CM Short Term Goal #4  Patient will be  evaluated by PCS within 30 days per family report  THN CM Short Term Goal #4 Start Date  12/15/16  [facility] CM Short Term Goal #4 Met Date  01/06/17  Interventions for Short Term Goal #4  Goal met. Evaluation completed on 01/06/17 and CSW was present for this.  THN CM Short Term Goal #5   Patient will choose personal care service agency within 30 days per family report  THN CM Short Term Goal #5 Start Date  01/06/17  [facility] CM Short Term Goal #5 Met Date  02/15/17  Interventions for Short Term Goal #5  Goal met. Patient has aide now.  THN CM Care Plan Problem Two     Most Recent Value  Care Plan Problem Two  Lack of overall resources and support within the home.  Role Documenting the Problem Two  Clinical Social Worker  Care Plan for Problem Two  Active  Interventions for Problem Two Long Term Goal   Home visit completed day and SCAT application was completed and faxed on 02/15/17. CSW will assist with this entire process.  THN Long Term Goal  Patient will gain SCAT transportation services within 90 days per family or patient report  THN Long Term Goal Start Date  01/26/17  [facility] CM Short Term Goal #1   Patient will gain new eyeglasses within 30 days  THN CM Short Term Goal #1 Start Date  03/24/17  Interventions for Short Term Goal #2   Family will be taking patient to [employer] to gain new eyeglasses. If family is unable to afford pair then they will inform CSW and she will put patient on the wait list for [employer] to get free pair of eyeglasses.      Plan: CSW will follow up within 30 days and continue to provide social work support and assistance. CSW will sent request to [facility] to send out [facility] current calendar to patient.  [name], BSW, MSW, [email or web address] Phone: [phone] Fax: [phone]

## 2017-03-24 NOTE — Assessment & Plan Note (Signed)
Will check cmp, cbc on keflex

## 2017-03-24 NOTE — Progress Notes (Signed)
   Subjective:    Patient ID: Charles Parker, male    DOB: 02/24/1946, 71 y.o.   MRN: [id number]  HPI Here for follow up on discitis with epidural abscess and positive E coli bacteremia.    Initially diagnosed in December with E coli with dicitis on MRI and treated with ceftriaxone for 6 weeks.  He represented in January with progressive weakness, pain and MRI with resolution of some areas and new areas of concern.  Underwent surgical debridement.  No new cultures grew anything.   He completed 6 weeks of IV therapy and 3 months ago had started on oral keflex 4 times per day.  He continues to do well with no significant changes of increased back pain, fever, chills.  No associated n/v/d.     Review of Systems  Gastrointestinal: Negative for diarrhea and nausea.  Musculoskeletal: Negative for back pain.  Neurological: Negative for light-headedness.       Objective:   Physical Exam  Constitutional: He appears well-developed and well-nourished. No distress.  Cardiovascular: Normal rate, regular rhythm and normal heart sounds.   Musculoskeletal: He exhibits no edema.  Skin: No rash noted.         Assessment & Plan:

## 2017-03-25 ENCOUNTER — Telehealth: Payer: Self-pay | Admitting: *Deleted

## 2017-03-25 DIAGNOSIS — M4620 Osteomyelitis of vertebra, site unspecified: Secondary | ICD-10-CM | POA: Diagnosis not present

## 2017-03-25 LAB — C-REACTIVE PROTEIN: CRP: 1.1 mg/L (ref <8.0)

## 2017-03-25 LAB — SEDIMENTATION RATE: Sed Rate: 4 mm/hr (ref 0–20)

## 2017-03-25 NOTE — Telephone Encounter (Signed)
Called patient's brother [name] to relay results. Sent follow up [employer]. Landis Gandy, RN

## 2017-03-25 NOTE — Telephone Encounter (Signed)
-----  Message from Thayer Headings, MD sent at 03/25/2017  2:11 PM EDT ----- Please let him know his labs look great (ESR and CrP) and when he completes his current month of Keflex he can stop, he will not need to refill.   thanks

## 2017-03-26 DIAGNOSIS — M4620 Osteomyelitis of vertebra, site unspecified: Secondary | ICD-10-CM | POA: Diagnosis not present

## 2017-03-27 DIAGNOSIS — M4620 Osteomyelitis of vertebra, site unspecified: Secondary | ICD-10-CM | POA: Diagnosis not present

## 2017-03-28 DIAGNOSIS — M4620 Osteomyelitis of vertebra, site unspecified: Secondary | ICD-10-CM | POA: Diagnosis not present

## 2017-03-29 ENCOUNTER — Encounter: Payer: Self-pay | Admitting: Internal Medicine

## 2017-03-29 DIAGNOSIS — M4620 Osteomyelitis of vertebra, site unspecified: Secondary | ICD-10-CM | POA: Diagnosis not present

## 2017-03-29 NOTE — Progress Notes (Unsigned)
Results entered and sent to scan  

## 2017-03-30 DIAGNOSIS — M4620 Osteomyelitis of vertebra, site unspecified: Secondary | ICD-10-CM | POA: Diagnosis not present

## 2017-03-30 DIAGNOSIS — G4730 Sleep apnea, unspecified: Secondary | ICD-10-CM | POA: Diagnosis not present

## 2017-03-30 DIAGNOSIS — R2689 Other abnormalities of gait and mobility: Secondary | ICD-10-CM | POA: Diagnosis not present

## 2017-03-30 DIAGNOSIS — D689 Coagulation defect, unspecified: Secondary | ICD-10-CM | POA: Diagnosis not present

## 2017-03-30 DIAGNOSIS — M4624 Osteomyelitis of vertebra, thoracic region: Secondary | ICD-10-CM | POA: Diagnosis not present

## 2017-03-30 DIAGNOSIS — I10 Essential (primary) hypertension: Secondary | ICD-10-CM | POA: Diagnosis not present

## 2017-03-30 DIAGNOSIS — I252 Old myocardial infarction: Secondary | ICD-10-CM | POA: Diagnosis not present

## 2017-03-30 DIAGNOSIS — H409 Unspecified glaucoma: Secondary | ICD-10-CM | POA: Diagnosis not present

## 2017-03-30 DIAGNOSIS — E119 Type 2 diabetes mellitus without complications: Secondary | ICD-10-CM | POA: Diagnosis not present

## 2017-03-30 DIAGNOSIS — M6281 Muscle weakness (generalized): Secondary | ICD-10-CM | POA: Diagnosis not present

## 2017-03-31 DIAGNOSIS — M4620 Osteomyelitis of vertebra, site unspecified: Secondary | ICD-10-CM | POA: Diagnosis not present

## 2017-04-01 DIAGNOSIS — M4620 Osteomyelitis of vertebra, site unspecified: Secondary | ICD-10-CM | POA: Diagnosis not present

## 2017-04-02 DIAGNOSIS — M4620 Osteomyelitis of vertebra, site unspecified: Secondary | ICD-10-CM | POA: Diagnosis not present

## 2017-04-03 DIAGNOSIS — M4620 Osteomyelitis of vertebra, site unspecified: Secondary | ICD-10-CM | POA: Diagnosis not present

## 2017-04-04 DIAGNOSIS — M4620 Osteomyelitis of vertebra, site unspecified: Secondary | ICD-10-CM | POA: Diagnosis not present

## 2017-04-05 DIAGNOSIS — M4620 Osteomyelitis of vertebra, site unspecified: Secondary | ICD-10-CM | POA: Diagnosis not present

## 2017-04-07 DIAGNOSIS — M4620 Osteomyelitis of vertebra, site unspecified: Secondary | ICD-10-CM | POA: Diagnosis not present

## 2017-04-08 DIAGNOSIS — M4620 Osteomyelitis of vertebra, site unspecified: Secondary | ICD-10-CM | POA: Diagnosis not present

## 2017-04-09 DIAGNOSIS — M4620 Osteomyelitis of vertebra, site unspecified: Secondary | ICD-10-CM | POA: Diagnosis not present

## 2017-04-10 DIAGNOSIS — M4620 Osteomyelitis of vertebra, site unspecified: Secondary | ICD-10-CM | POA: Diagnosis not present

## 2017-04-11 DIAGNOSIS — M4620 Osteomyelitis of vertebra, site unspecified: Secondary | ICD-10-CM | POA: Diagnosis not present

## 2017-04-12 ENCOUNTER — Encounter: Payer: Self-pay | Admitting: Podiatry

## 2017-04-12 ENCOUNTER — Ambulatory Visit (INDEPENDENT_AMBULATORY_CARE_PROVIDER_SITE_OTHER): Payer: Medicare HMO | Admitting: Podiatry

## 2017-04-12 DIAGNOSIS — B351 Tinea unguium: Secondary | ICD-10-CM

## 2017-04-12 DIAGNOSIS — M205X9 Other deformities of toe(s) (acquired), unspecified foot: Secondary | ICD-10-CM

## 2017-04-12 DIAGNOSIS — E119 Type 2 diabetes mellitus without complications: Secondary | ICD-10-CM

## 2017-04-12 DIAGNOSIS — M4624 Osteomyelitis of vertebra, thoracic region: Secondary | ICD-10-CM | POA: Diagnosis not present

## 2017-04-12 DIAGNOSIS — M4620 Osteomyelitis of vertebra, site unspecified: Secondary | ICD-10-CM | POA: Diagnosis not present

## 2017-04-12 NOTE — Progress Notes (Signed)
   Subjective:    Patient ID: Charles Parker, male    DOB: 10/24/1945, 71 y.o.   MRN: [id number]  HPI this patient presents the office with chief complaint of long, ingrowing painful toenails on both feet. Patient is accompanied to the office by his brother. He states that his nails have become thick and long and painful walking and wearing his shoes. Patient is a diabetic taking gabapentin. He also admits to just having back surgery. He presents the office today for an evaluation and treatment of his feet.  Patient was evaluated for arthritis both big toes by orthopedic doctor.    Review of Systems  All other systems reviewed and are negative.      Objective:   Physical Exam GENERAL APPEARANCE: Alert, conversant. Appropriately groomed. No acute distress.  VASCULAR: Pedal pulses are  palpable at  DP and PT bilateral.  Capillary refill time is immediate to all digits,  Normal temperature gradient.   NEUROLOGIC: sensation is normal to 5.07 monofilament at 5/5 sites bilateral.  Light touch is intact bilateral, Muscle strength normal.  MUSCULOSKELETAL: acceptable muscle strength, tone and stability bilateral.  Intrinsic muscluature intact bilateral.  DJD 1st MPJ  B/L. Overlapping second toe  B/l  DERMATOLOGIC: skin color, texture, and turgor are within normal limits.  No preulcerative lesions or ulcers  are seen, no interdigital maceration noted.  No open lesions present.   No drainage noted.  NAILS  Thick disfigured discolored nails both feet.         Assessment & Plan:  Onychomycosis  B/L  Debridement of long thick painful nails.  RTC 3 months.   Arrion Burruel DPM 

## 2017-04-13 DIAGNOSIS — M4620 Osteomyelitis of vertebra, site unspecified: Secondary | ICD-10-CM | POA: Diagnosis not present

## 2017-04-14 ENCOUNTER — Other Ambulatory Visit: Payer: Self-pay | Admitting: Licensed Clinical Social Worker

## 2017-04-14 DIAGNOSIS — M4620 Osteomyelitis of vertebra, site unspecified: Secondary | ICD-10-CM | POA: Diagnosis not present

## 2017-04-14 NOTE — Patient Outreach (Signed)
[facility]) Care Management  04/14/2017  KAYZEN KENDZIERSKI 06-26-1946 [id number]  Assessment-CSW completed outreach attempt today. CSW unable to reach patient successfully. CSW left a HIPPA compliant voice message encouraging patient to return call once available.  Plan-CSW will await return call or complete an additional outreach if needed within two weeks.  [name], BSW, MSW, [email or web address] Phone: [phone] Fax: [phone]

## 2017-04-15 DIAGNOSIS — M4620 Osteomyelitis of vertebra, site unspecified: Secondary | ICD-10-CM | POA: Diagnosis not present

## 2017-04-16 DIAGNOSIS — M4620 Osteomyelitis of vertebra, site unspecified: Secondary | ICD-10-CM | POA: Diagnosis not present

## 2017-04-17 DIAGNOSIS — M4620 Osteomyelitis of vertebra, site unspecified: Secondary | ICD-10-CM | POA: Diagnosis not present

## 2017-04-18 DIAGNOSIS — M4620 Osteomyelitis of vertebra, site unspecified: Secondary | ICD-10-CM | POA: Diagnosis not present

## 2017-04-19 DIAGNOSIS — M4620 Osteomyelitis of vertebra, site unspecified: Secondary | ICD-10-CM | POA: Diagnosis not present

## 2017-04-20 DIAGNOSIS — M4620 Osteomyelitis of vertebra, site unspecified: Secondary | ICD-10-CM | POA: Diagnosis not present

## 2017-04-21 ENCOUNTER — Encounter: Payer: Self-pay | Admitting: Internal Medicine

## 2017-04-21 DIAGNOSIS — M4620 Osteomyelitis of vertebra, site unspecified: Secondary | ICD-10-CM | POA: Diagnosis not present

## 2017-04-22 DIAGNOSIS — M4620 Osteomyelitis of vertebra, site unspecified: Secondary | ICD-10-CM | POA: Diagnosis not present

## 2017-04-23 DIAGNOSIS — R2689 Other abnormalities of gait and mobility: Secondary | ICD-10-CM | POA: Diagnosis not present

## 2017-04-23 DIAGNOSIS — E139 Other specified diabetes mellitus without complications: Secondary | ICD-10-CM | POA: Diagnosis not present

## 2017-04-23 DIAGNOSIS — M4624 Osteomyelitis of vertebra, thoracic region: Secondary | ICD-10-CM | POA: Diagnosis not present

## 2017-04-23 DIAGNOSIS — M4620 Osteomyelitis of vertebra, site unspecified: Secondary | ICD-10-CM | POA: Diagnosis not present

## 2017-04-23 DIAGNOSIS — G062 Extradural and subdural abscess, unspecified: Secondary | ICD-10-CM | POA: Diagnosis not present

## 2017-04-23 DIAGNOSIS — Z981 Arthrodesis status: Secondary | ICD-10-CM | POA: Diagnosis not present

## 2017-04-23 DIAGNOSIS — M4324 Fusion of spine, thoracic region: Secondary | ICD-10-CM | POA: Diagnosis not present

## 2017-04-25 DIAGNOSIS — M4620 Osteomyelitis of vertebra, site unspecified: Secondary | ICD-10-CM | POA: Diagnosis not present

## 2017-04-26 DIAGNOSIS — M4620 Osteomyelitis of vertebra, site unspecified: Secondary | ICD-10-CM | POA: Diagnosis not present

## 2017-04-27 DIAGNOSIS — M4620 Osteomyelitis of vertebra, site unspecified: Secondary | ICD-10-CM | POA: Diagnosis not present

## 2017-04-28 ENCOUNTER — Other Ambulatory Visit: Payer: Self-pay | Admitting: Licensed Clinical Social Worker

## 2017-04-28 DIAGNOSIS — M4620 Osteomyelitis of vertebra, site unspecified: Secondary | ICD-10-CM | POA: Diagnosis not present

## 2017-04-28 NOTE — Patient Outreach (Signed)
[facility]) Care Management  04/28/2017  Charles Parker 13-Nov-1945 [id number]  Assessment-CSW completed outreach attempt today. CSW unable to reach patient successfully. CSW left a HIPPA compliant voice message encouraging patient to return call once available.  Plan-CSW will await return call or complete an additional outreach if needed.  Charles Parker, BSW, MSW, [email or web address] Phone: [phone] Fax: [phone]

## 2017-04-29 ENCOUNTER — Other Ambulatory Visit: Payer: Self-pay | Admitting: Licensed Clinical Social Worker

## 2017-04-29 DIAGNOSIS — M4620 Osteomyelitis of vertebra, site unspecified: Secondary | ICD-10-CM | POA: Diagnosis not present

## 2017-04-29 NOTE — Patient Outreach (Signed)
[facility]) Care Management  04/29/2017  Charles Parker 1946-01-07 [id number]  Assessment- CSW received incoming call from patient. Patient states that he is with his brother. CSW spoke to patient's brother Charles Parker. Charles Parker shares that patient is doing "extremely well" and that his most recent physician appointments have gone well. He shares that patient's walking continues to get better. Patient continues to use his walker. Brother share that patient's aide is coming daily to provide care to patient. CSW was informed that patient's socialization has increased over the last few weeks. Patient's friends have been picking him up and going out. Family deny having any other social work needs at this time and are agreeable to case closure.  Plan-CSW will update PCP and [facility] Care Management Assistant of case closure.  Charles Parker, BSW, MSW, [email or web address] Phone: [phone] Fax: [phone]

## 2017-04-30 DIAGNOSIS — M4620 Osteomyelitis of vertebra, site unspecified: Secondary | ICD-10-CM | POA: Diagnosis not present

## 2017-05-01 DIAGNOSIS — M4620 Osteomyelitis of vertebra, site unspecified: Secondary | ICD-10-CM | POA: Diagnosis not present

## 2017-05-02 DIAGNOSIS — M4620 Osteomyelitis of vertebra, site unspecified: Secondary | ICD-10-CM | POA: Diagnosis not present

## 2017-05-03 DIAGNOSIS — M4620 Osteomyelitis of vertebra, site unspecified: Secondary | ICD-10-CM | POA: Diagnosis not present

## 2017-05-04 DIAGNOSIS — M4620 Osteomyelitis of vertebra, site unspecified: Secondary | ICD-10-CM | POA: Diagnosis not present

## 2017-05-05 DIAGNOSIS — M4620 Osteomyelitis of vertebra, site unspecified: Secondary | ICD-10-CM | POA: Diagnosis not present

## 2017-05-06 DIAGNOSIS — M4620 Osteomyelitis of vertebra, site unspecified: Secondary | ICD-10-CM | POA: Diagnosis not present

## 2017-05-07 DIAGNOSIS — M4620 Osteomyelitis of vertebra, site unspecified: Secondary | ICD-10-CM | POA: Diagnosis not present

## 2017-05-08 DIAGNOSIS — M4620 Osteomyelitis of vertebra, site unspecified: Secondary | ICD-10-CM | POA: Diagnosis not present

## 2017-05-09 DIAGNOSIS — M4620 Osteomyelitis of vertebra, site unspecified: Secondary | ICD-10-CM | POA: Diagnosis not present

## 2017-05-10 DIAGNOSIS — M4620 Osteomyelitis of vertebra, site unspecified: Secondary | ICD-10-CM | POA: Diagnosis not present

## 2017-05-11 DIAGNOSIS — M4620 Osteomyelitis of vertebra, site unspecified: Secondary | ICD-10-CM | POA: Diagnosis not present

## 2017-05-12 DIAGNOSIS — M4620 Osteomyelitis of vertebra, site unspecified: Secondary | ICD-10-CM | POA: Diagnosis not present

## 2017-05-13 DIAGNOSIS — M4620 Osteomyelitis of vertebra, site unspecified: Secondary | ICD-10-CM | POA: Diagnosis not present

## 2017-05-14 DIAGNOSIS — M4620 Osteomyelitis of vertebra, site unspecified: Secondary | ICD-10-CM | POA: Diagnosis not present

## 2017-05-15 DIAGNOSIS — M4620 Osteomyelitis of vertebra, site unspecified: Secondary | ICD-10-CM | POA: Diagnosis not present

## 2017-05-16 DIAGNOSIS — M4620 Osteomyelitis of vertebra, site unspecified: Secondary | ICD-10-CM | POA: Diagnosis not present

## 2017-05-17 DIAGNOSIS — M4620 Osteomyelitis of vertebra, site unspecified: Secondary | ICD-10-CM | POA: Diagnosis not present

## 2017-05-18 DIAGNOSIS — M4620 Osteomyelitis of vertebra, site unspecified: Secondary | ICD-10-CM | POA: Diagnosis not present

## 2017-05-19 DIAGNOSIS — M4620 Osteomyelitis of vertebra, site unspecified: Secondary | ICD-10-CM | POA: Diagnosis not present

## 2017-05-20 DIAGNOSIS — M4620 Osteomyelitis of vertebra, site unspecified: Secondary | ICD-10-CM | POA: Diagnosis not present

## 2017-05-21 DIAGNOSIS — M4620 Osteomyelitis of vertebra, site unspecified: Secondary | ICD-10-CM | POA: Diagnosis not present

## 2017-05-22 DIAGNOSIS — M4620 Osteomyelitis of vertebra, site unspecified: Secondary | ICD-10-CM | POA: Diagnosis not present

## 2017-05-23 DIAGNOSIS — M4620 Osteomyelitis of vertebra, site unspecified: Secondary | ICD-10-CM | POA: Diagnosis not present

## 2017-05-24 DIAGNOSIS — Z981 Arthrodesis status: Secondary | ICD-10-CM | POA: Diagnosis not present

## 2017-05-24 DIAGNOSIS — M4324 Fusion of spine, thoracic region: Secondary | ICD-10-CM | POA: Diagnosis not present

## 2017-05-24 DIAGNOSIS — R2689 Other abnormalities of gait and mobility: Secondary | ICD-10-CM | POA: Diagnosis not present

## 2017-05-24 DIAGNOSIS — M4624 Osteomyelitis of vertebra, thoracic region: Secondary | ICD-10-CM | POA: Diagnosis not present

## 2017-05-24 DIAGNOSIS — E139 Other specified diabetes mellitus without complications: Secondary | ICD-10-CM | POA: Diagnosis not present

## 2017-05-24 DIAGNOSIS — M4620 Osteomyelitis of vertebra, site unspecified: Secondary | ICD-10-CM | POA: Diagnosis not present

## 2017-05-24 DIAGNOSIS — G062 Extradural and subdural abscess, unspecified: Secondary | ICD-10-CM | POA: Diagnosis not present

## 2017-05-25 DIAGNOSIS — M4620 Osteomyelitis of vertebra, site unspecified: Secondary | ICD-10-CM | POA: Diagnosis not present

## 2017-05-26 DIAGNOSIS — M4620 Osteomyelitis of vertebra, site unspecified: Secondary | ICD-10-CM | POA: Diagnosis not present

## 2017-05-27 DIAGNOSIS — M4620 Osteomyelitis of vertebra, site unspecified: Secondary | ICD-10-CM | POA: Diagnosis not present

## 2017-05-28 DIAGNOSIS — M4620 Osteomyelitis of vertebra, site unspecified: Secondary | ICD-10-CM | POA: Diagnosis not present

## 2017-05-29 DIAGNOSIS — M4620 Osteomyelitis of vertebra, site unspecified: Secondary | ICD-10-CM | POA: Diagnosis not present

## 2017-05-31 DIAGNOSIS — M4620 Osteomyelitis of vertebra, site unspecified: Secondary | ICD-10-CM | POA: Diagnosis not present

## 2017-06-01 DIAGNOSIS — M4620 Osteomyelitis of vertebra, site unspecified: Secondary | ICD-10-CM | POA: Diagnosis not present

## 2017-06-02 DIAGNOSIS — M4620 Osteomyelitis of vertebra, site unspecified: Secondary | ICD-10-CM | POA: Diagnosis not present

## 2017-06-03 DIAGNOSIS — M4620 Osteomyelitis of vertebra, site unspecified: Secondary | ICD-10-CM | POA: Diagnosis not present

## 2017-06-04 DIAGNOSIS — M4620 Osteomyelitis of vertebra, site unspecified: Secondary | ICD-10-CM | POA: Diagnosis not present

## 2017-06-05 DIAGNOSIS — M4620 Osteomyelitis of vertebra, site unspecified: Secondary | ICD-10-CM | POA: Diagnosis not present

## 2017-06-07 DIAGNOSIS — M4620 Osteomyelitis of vertebra, site unspecified: Secondary | ICD-10-CM | POA: Diagnosis not present

## 2017-06-08 DIAGNOSIS — M4620 Osteomyelitis of vertebra, site unspecified: Secondary | ICD-10-CM | POA: Diagnosis not present

## 2017-06-09 DIAGNOSIS — M4620 Osteomyelitis of vertebra, site unspecified: Secondary | ICD-10-CM | POA: Diagnosis not present

## 2017-06-10 DIAGNOSIS — M4620 Osteomyelitis of vertebra, site unspecified: Secondary | ICD-10-CM | POA: Diagnosis not present

## 2017-06-11 DIAGNOSIS — M4620 Osteomyelitis of vertebra, site unspecified: Secondary | ICD-10-CM | POA: Diagnosis not present

## 2017-06-12 DIAGNOSIS — M4620 Osteomyelitis of vertebra, site unspecified: Secondary | ICD-10-CM | POA: Diagnosis not present

## 2017-06-13 ENCOUNTER — Ambulatory Visit (INDEPENDENT_AMBULATORY_CARE_PROVIDER_SITE_OTHER): Payer: Medicare HMO | Admitting: Internal Medicine

## 2017-06-13 ENCOUNTER — Encounter: Payer: Self-pay | Admitting: Internal Medicine

## 2017-06-13 VITALS — BP 136/88 | HR 78 | Temp 98.3°F | Resp 16 | Ht 71.0 in | Wt 195.0 lb

## 2017-06-13 DIAGNOSIS — Z23 Encounter for immunization: Secondary | ICD-10-CM | POA: Diagnosis not present

## 2017-06-13 DIAGNOSIS — E118 Type 2 diabetes mellitus with unspecified complications: Secondary | ICD-10-CM | POA: Diagnosis not present

## 2017-06-13 DIAGNOSIS — M4620 Osteomyelitis of vertebra, site unspecified: Secondary | ICD-10-CM | POA: Diagnosis not present

## 2017-06-13 DIAGNOSIS — Z794 Long term (current) use of insulin: Secondary | ICD-10-CM

## 2017-06-13 LAB — POCT GLYCOSYLATED HEMOGLOBIN (HGB A1C): HEMOGLOBIN A1C: 9.1

## 2017-06-13 MED ORDER — INSULIN GLARGINE 100 UNIT/ML ~~LOC~~ SOLN: 30.0000 [IU] | Freq: Every day | SUBCUTANEOUS | 5 refills | Status: DC

## 2017-06-13 MED ORDER — FREESTYLE LIBRE READER DEVI: 1.0000 | Freq: Three times a day (TID) | 1 refills | Status: DC

## 2017-06-13 MED ORDER — FREESTYLE LIBRE SENSOR SYSTEM MISC: 1.0000 | Freq: Three times a day (TID) | 1 refills | Status: DC

## 2017-06-13 MED ORDER — DAPAGLIFLOZIN PROPANEDIOL 5 MG PO TABS: 5.0000 mg | ORAL_TABLET | Freq: Every day | ORAL | 5 refills | Status: DC

## 2017-06-13 NOTE — Patient Instructions (Signed)

## 2017-06-13 NOTE — Progress Notes (Signed)
Subjective:  Patient ID: Charles Parker, male    DOB: 09/17/1946  Age: 71 y.o. MRN: [id number]  CC: Diabetes   HPI Charles Parker presents for f/up - he complains that his blood sugars have not been well controlled, they are consistently around or above 200. He thinks he is administering his insulins correctly.  Outpatient Medications Prior to Visit  Medication Sig Dispense Refill  . ferrous sulfate 325 (65 FE) MG tablet Take 1 tablet (325 mg total) by mouth 2 (two) times daily with a meal. 180 tablet 1  . gabapentin (NEURONTIN) 300 MG capsule Take 1 capsule (300 mg total) by mouth 2 (two) times daily. 180 capsule 1  . insulin aspart (NOVOLOG FLEXPEN) 100 UNIT/ML FlexPen Inject 10 Units into the skin 3 (three) times daily with meals. 15 mL 11  . Insulin Pen Needle 31G X 5 MM MISC Use with both insulin pens 400 each 1  . oxybutynin (DITROPAN) 5 MG tablet TAKE 1 TABLET(5 MG) BY MOUTH DAILY 90 tablet 1  . rivaroxaban (XARELTO) 20 MG TABS tablet Take 1 tablet (20 mg total) by mouth daily with supper. 90 tablet 3  . simvastatin (ZOCOR) 40 MG tablet Take 1 tablet (40 mg total) by mouth at bedtime. 90 tablet 3  . tamsulosin (FLOMAX) 0.4 MG CAPS capsule Take 1 capsule (0.4 mg total) by mouth daily after breakfast. 90 capsule 1  . TRUE METRIX BLOOD GLUCOSE TEST test strip USE TO CHECK BLOOD SUGARS TWICE DAILY 200 each 3  . Vitamin D, Ergocalciferol, (DRISDOL) 50000 units CAPS capsule Take 1 capsule (50,000 Units total) by mouth every 7 (seven) days. 12 capsule 0  . cephALEXin (KEFLEX) 500 MG capsule TAKE ONE CAPSULE BY MOUTH FOUR TIMES DAILY UNTIL DISCONTINUED BY INFECTIOUS DISEASE 120 capsule 0  . insulin glargine (LANTUS) 100 UNIT/ML injection Inject 0.25 mLs (25 Units total) into the skin at bedtime. 10 mL 5  . polyethylene glycol (MIRALAX / GLYCOLAX) packet Take 17 g by mouth 2 (two) times daily. 14 each 0   No facility-administered medications prior to visit.     ROS Review of Systems    Constitutional: Negative for chills, diaphoresis, fatigue and fever.  HENT: Negative.   Eyes: Negative.   Respiratory: Negative.  Negative for cough, chest tightness, shortness of breath and wheezing.   Cardiovascular: Negative for chest pain, palpitations and leg swelling.  Gastrointestinal: Negative for abdominal pain, constipation, diarrhea, nausea and vomiting.  Endocrine: Positive for polydipsia. Negative for polyphagia and polyuria.  Genitourinary: Negative.  Negative for difficulty urinating, dysuria, hematuria and urgency.  Musculoskeletal: Negative.  Negative for back pain, myalgias and neck pain.  Skin: Negative.   Allergic/Immunologic: Negative.   Neurological: Negative.   Hematological: Negative.  Negative for adenopathy. Does not bruise/bleed easily.  Psychiatric/Behavioral: Negative.     Objective:  BP 136/88   Pulse 78   Temp 98.3 F (36.8 C) (Oral)   Resp 16   Ht 5\' 11"  (1.803 m)   Wt 195 lb (88.5 kg)   SpO2 99%   BMI 27.20 kg/m   BP Readings from Last 3 Encounters:  06/13/17 136/88  03/24/17 123/86  03/22/17 128/84    Wt Readings from Last 3 Encounters:  06/13/17 195 lb (88.5 kg)  03/24/17 188 lb (85.3 kg)  03/22/17 191 lb (86.6 kg)    Physical Exam  Constitutional: He is oriented to person, place, and time. No distress.  HENT:  Mouth/Throat: Oropharynx is clear and moist. No  oropharyngeal exudate.  Eyes: Conjunctivae are normal. Right eye exhibits no discharge. Left eye exhibits no discharge. No scleral icterus.  Neck: Normal range of motion. Neck supple. No JVD present. No thyromegaly present.  Cardiovascular: Regular rhythm and intact distal pulses.  Exam reveals no gallop and no friction rub.   No murmur heard. Pulmonary/Chest: Effort normal and breath sounds normal. No respiratory distress. He has no wheezes. He has no rales. He exhibits no tenderness.  Abdominal: Soft. Bowel sounds are normal. He exhibits no distension and no mass. There is no  tenderness. There is no rebound and no guarding.  Musculoskeletal: Normal range of motion. He exhibits no edema, tenderness or deformity.  Lymphadenopathy:    He has no cervical adenopathy.  Neurological: He is alert and oriented to person, place, and time.  Skin: Skin is warm and dry. No rash noted. He is not diaphoretic. No erythema. No pallor.  Vitals reviewed.   Lab Results  Component Value Date   WBC 4.2 03/24/2017   HGB 14.9 03/24/2017   HCT 44.5 03/24/2017   PLT 154 03/24/2017   GLUCOSE 81 03/24/2017   CHOL 161 12/15/2016   TRIG 108.0 12/15/2016   HDL 52.10 12/15/2016   LDLDIRECT 118.0 11/11/2015   LDLCALC 87 12/15/2016   ALT 30 03/24/2017   AST 23 03/24/2017   NA 142 03/24/2017   K 4.4 03/24/2017   CL 105 03/24/2017   CREATININE 1.04 03/24/2017   BUN 11 03/24/2017   CO2 29 03/24/2017   TSH 0.67 09/08/2016   PSA 1.49 08/17/2016   INR 1.37 09/26/2016   HGBA1C 9.1 06/13/2017   MICROALBUR 1.0 02/08/2017    No results found.  Assessment & Plan:   Charles Parker was seen today for diabetes.  Diagnoses and all orders for this visit:  Flu vaccine need -     Flu vaccine HIGH DOSE PF  Type 2 diabetes mellitus with complication, with long-term current use of insulin ([place])- his A1C is up to 9.1%, will increase his basal insulin dose and will add an SGLT-2 inhibitor, I have also asked him to see diab education to be certain that he is accurately using his insulins -     POCT HgB A1C -     dapagliflozin propanediol (FARXIGA) 5 MG TABS tablet; Take 5 mg by mouth daily. -     insulin glargine (LANTUS) 100 UNIT/ML injection; Inject 0.3 mLs (30 Units total) into the skin at bedtime. -     Continuous Blood Gluc Sensor ([place]) MISC; 1 Act by Does not apply route 3 (three) times daily before meals. -     Continuous Blood Gluc Receiver (FREESTYLE LIBRE READER) DEVI; 1 Act by Does not apply route 3 (three) times daily with meals. -     Amb Referral to Nutrition  and Diabetic E   I have discontinued [name]'s polyethylene glycol and cephALEXin. I have also changed his insulin glargine. Additionally, I am having him start on dapagliflozin propanediol, Charles Parker, and FREESTYLE LIBRE READER. Lastly, I am having him maintain his insulin aspart, TRUE METRIX BLOOD GLUCOSE TEST, Insulin Pen Needle, oxybutynin, rivaroxaban, simvastatin, gabapentin, tamsulosin, ferrous sulfate, and Vitamin D (Ergocalciferol).  Meds ordered this encounter  Medications  . dapagliflozin propanediol (FARXIGA) 5 MG TABS tablet    Sig: Take 5 mg by mouth daily.    Dispense:  30 tablet    Refill:  5  . insulin glargine (LANTUS) 100 UNIT/ML injection  Sig: Inject 0.3 mLs (30 Units total) into the skin at bedtime.    Dispense:  10 mL    Refill:  5  . Continuous Blood Gluc Sensor (FREESTYLE LIBRE SENSOR SYSTEM) MISC    Sig: 1 Act by Does not apply route 3 (three) times daily before meals.    Dispense:  1 each    Refill:  1  . Continuous Blood Gluc Receiver (FREESTYLE LIBRE READER) DEVI    Sig: 1 Act by Does not apply route 3 (three) times daily with meals.    Dispense:  1 Device    Refill:  1     Follow-up: Return in about 4 months (around 10/13/2017).  Scarlette Calico, MD

## 2017-06-14 DIAGNOSIS — M4620 Osteomyelitis of vertebra, site unspecified: Secondary | ICD-10-CM | POA: Diagnosis not present

## 2017-06-15 DIAGNOSIS — M4620 Osteomyelitis of vertebra, site unspecified: Secondary | ICD-10-CM | POA: Diagnosis not present

## 2017-06-16 DIAGNOSIS — M4620 Osteomyelitis of vertebra, site unspecified: Secondary | ICD-10-CM | POA: Diagnosis not present

## 2017-06-17 DIAGNOSIS — M4620 Osteomyelitis of vertebra, site unspecified: Secondary | ICD-10-CM | POA: Diagnosis not present

## 2017-06-18 DIAGNOSIS — M4620 Osteomyelitis of vertebra, site unspecified: Secondary | ICD-10-CM | POA: Diagnosis not present

## 2017-06-19 DIAGNOSIS — M4620 Osteomyelitis of vertebra, site unspecified: Secondary | ICD-10-CM | POA: Diagnosis not present

## 2017-06-20 DIAGNOSIS — M4620 Osteomyelitis of vertebra, site unspecified: Secondary | ICD-10-CM | POA: Diagnosis not present

## 2017-06-21 DIAGNOSIS — M4620 Osteomyelitis of vertebra, site unspecified: Secondary | ICD-10-CM | POA: Diagnosis not present

## 2017-06-22 DIAGNOSIS — M4620 Osteomyelitis of vertebra, site unspecified: Secondary | ICD-10-CM | POA: Diagnosis not present

## 2017-06-23 DIAGNOSIS — M4620 Osteomyelitis of vertebra, site unspecified: Secondary | ICD-10-CM | POA: Diagnosis not present

## 2017-06-24 DIAGNOSIS — M4624 Osteomyelitis of vertebra, thoracic region: Secondary | ICD-10-CM | POA: Diagnosis not present

## 2017-06-24 DIAGNOSIS — M4620 Osteomyelitis of vertebra, site unspecified: Secondary | ICD-10-CM | POA: Diagnosis not present

## 2017-06-24 DIAGNOSIS — Z981 Arthrodesis status: Secondary | ICD-10-CM | POA: Diagnosis not present

## 2017-06-24 DIAGNOSIS — G062 Extradural and subdural abscess, unspecified: Secondary | ICD-10-CM | POA: Diagnosis not present

## 2017-06-24 DIAGNOSIS — R2689 Other abnormalities of gait and mobility: Secondary | ICD-10-CM | POA: Diagnosis not present

## 2017-06-24 DIAGNOSIS — M4324 Fusion of spine, thoracic region: Secondary | ICD-10-CM | POA: Diagnosis not present

## 2017-06-24 DIAGNOSIS — E139 Other specified diabetes mellitus without complications: Secondary | ICD-10-CM | POA: Diagnosis not present

## 2017-06-25 DIAGNOSIS — M4620 Osteomyelitis of vertebra, site unspecified: Secondary | ICD-10-CM | POA: Diagnosis not present

## 2017-06-28 ENCOUNTER — Encounter: Payer: Self-pay | Admitting: Endocrinology

## 2017-06-28 ENCOUNTER — Other Ambulatory Visit (INDEPENDENT_AMBULATORY_CARE_PROVIDER_SITE_OTHER): Payer: Medicare HMO

## 2017-06-28 ENCOUNTER — Ambulatory Visit (INDEPENDENT_AMBULATORY_CARE_PROVIDER_SITE_OTHER): Payer: Medicare HMO | Admitting: Endocrinology

## 2017-06-28 ENCOUNTER — Other Ambulatory Visit: Payer: Self-pay

## 2017-06-28 VITALS — BP 138/80 | HR 90 | Ht 71.0 in | Wt 194.4 lb

## 2017-06-28 DIAGNOSIS — Z794 Long term (current) use of insulin: Secondary | ICD-10-CM

## 2017-06-28 DIAGNOSIS — E1165 Type 2 diabetes mellitus with hyperglycemia: Secondary | ICD-10-CM

## 2017-06-28 LAB — BASIC METABOLIC PANEL
BUN: 14 mg/dL (ref 6–23)
CALCIUM: 9.5 mg/dL (ref 8.4–10.5)
CHLORIDE: 106 meq/L (ref 96–112)
CO2: 26 mEq/L (ref 19–32)
Creatinine, Ser: 1.21 mg/dL (ref 0.40–1.50)
GFR: 75.95 mL/min (ref >60.00)
GLUCOSE: 237 mg/dL — AB (ref 70–99)
Potassium: 3.8 mEq/L (ref 3.5–5.1)
SODIUM: 140 meq/L (ref 135–145)

## 2017-06-28 MED ORDER — GLUCOSE BLOOD VI STRP: ORAL_STRIP | 3 refills | Status: DC

## 2017-06-28 MED ORDER — ACCU-CHEK FASTCLIX LANCETS MISC: 3 refills | Status: DC

## 2017-06-28 NOTE — Progress Notes (Signed)
Patient ID: Charles Parker, male   DOB: 08-04-1946, 71 y.o.   MRN: [id number]          Reason for Appointment: Consultation for Type 2 Diabetes  Referring physician: Scarlette Calico   History of Present Illness:          Date of diagnosis of type 2 diabetes mellitus:        Background history:   He has been on various oral hypoglycemic drugs since the onset including metformin, Januvia and Amaryl Was also on Invokana in 2015 for about a year but not clear why this was stopped His level of control has been quite variable with highest A1c 13.6 in 06/2015  He has been on insulin since about 10/2014 as documented in his record  Recent history:   INSULIN regimen is:  Lantus 30   units bedtime, NovoLog 10 units before meals    Non-insulin hypoglycemic drugs the patient is taking HER:DEYCXKG 5 mg daily  Current management, blood sugar patterns and problems identified:  Since his A1c was higher in 9/18 his Lantus was increased by 5 units  Also he has been started on Farxiga 5 mg daily since then  The patient thinks that he would tend to have higher readings in the mornings but the last few days they are better and mostly in the 120-130 range  He cannot be sure for sugars are improved with increasing his insulin and using Farxiga  Blood sugars the rest of they are somewhat variable and he is not sure of the exact readings are  Does not think he gets low blood sugars although only occasionally may have blood sugars in the 60s         Side effects from medications have been: Hives from metformin  Compliance with the medical regimen: Good  Glucose monitoring:  done 3 times a   day usually        Glucometer:  True Metrix     Blood Glucose readings by recall   PREMEAL Breakfast Lunch Dinner Bedtime  Overall   Glucose range: 120s-200 150-160 upto 200 150   Median:        Self-care: The diet that the patient has been following is: tries to limit [employer] .      Typical meal  intake: Breakfast is cereal or oatmeal usually, lunch: Cold cut sandwiches.  Dinner chicken and vegetables.  Snacks will be chips, peanut butter crackers Supper at 6 pm               Dietician visit, most recent:10/16               Exercise: some walking was not able to do much because of leg weakness   Weight history:  Wt Readings from Last 3 Encounters:  06/28/17 194 lb 6.4 oz (88.2 kg)  06/13/17 195 lb (88.5 kg)  03/24/17 188 lb (85.3 kg)    Glycemic control:   Lab Results  Component Value Date   HGBA1C 9.1 06/13/2017   HGBA1C 7.5 (H) 02/08/2017   HGBA1C 7.3 (H) 10/21/2016   Lab Results  Component Value Date   MICROALBUR 1.0 02/08/2017   LDLCALC 87 12/15/2016   CREATININE 1.04 03/24/2017   Lab Results  Component Value Date   MICRALBCREAT 0.5 02/08/2017    No results found for: FRUCTOSAMINE    Allergies as of 06/28/2017      Reactions   Metformin And Related Diarrhea   Codeine Rash   All  over the body      Medication List       Accurate as of 06/28/17  3:40 PM. Always use your most recent med list.          dapagliflozin propanediol 5 MG Tabs tablet Commonly known as:  FARXIGA Take 5 mg by mouth daily.   ferrous sulfate 325 (65 FE) MG tablet Take 1 tablet (325 mg total) by mouth 2 (two) times daily with a meal.   FREESTYLE LIBRE READER [place] 1 Act by Does not apply route 3 (three) times daily with meals.   [place] Misc 1 Act by Does not apply route 3 (three) times daily before meals.   gabapentin 300 MG capsule Commonly known as:  NEURONTIN Take 1 capsule (300 mg total) by mouth 2 (two) times daily.   insulin aspart 100 UNIT/ML FlexPen Commonly known as:  NOVOLOG FLEXPEN Inject 10 Units into the skin 3 (three) times daily with meals.   insulin glargine 100 UNIT/ML injection Commonly known as:  LANTUS Inject 0.3 mLs (30 Units total) into the skin at bedtime.   Insulin Pen Needle 31G X 5 MM Misc Use with both insulin  pens   oxybutynin 5 MG tablet Commonly known as:  DITROPAN TAKE 1 TABLET(5 MG) BY MOUTH DAILY   rivaroxaban 20 MG Tabs tablet Commonly known as:  XARELTO Take 1 tablet (20 mg total) by mouth daily with supper.   simvastatin 40 MG tablet Commonly known as:  ZOCOR Take 1 tablet (40 mg total) by mouth at bedtime.   tamsulosin 0.4 MG Caps capsule Commonly known as:  FLOMAX Take 1 capsule (0.4 mg total) by mouth daily after breakfast.   TRUE METRIX BLOOD GLUCOSE TEST test strip Generic drug:  glucose blood USE TO CHECK BLOOD SUGARS TWICE DAILY   Vitamin D (Ergocalciferol) 50000 units Caps capsule Commonly known as:  DRISDOL Take 1 capsule (50,000 Units total) by mouth every 7 (seven) days.            Discharge Care Instructions        Start     Ordered   06/28/17 4098  Basic metabolic panel     11/91/47 1458   06/28/17 0000  Fructosamine     06/28/17 1458      Allergies:  Allergies  Allergen Reactions  . Metformin And Related Diarrhea  . Codeine Rash    All over the body    Past Medical History:  Diagnosis Date  . Clotting disorder (HCC)    DVT  . Diabetes mellitus    type II  . Diverticulosis   . DVT (deep venous thrombosis) ([place])   . Glaucoma    no eye drops   . Hepatic cyst   . Hypercholesterolemia   . Hypertension   . Myocardial infarction ([place])    very mild long ago   . Renal cyst   . Sleep apnea   . Tubulovillous adenoma     Past Surgical History:  Procedure Laterality Date  . BLADDER NECK RECONSTRUCTION  02/03/2012   Procedure: BLADDER NECK REPAIR;  Surgeon: Adin Hector, MD;  Location: [facility] ORS;  Service: General;;  . COLONOSCOPY  02/2013   polyp removal(mult.)  . KNEE SURGERY  2004   left  . POLYPECTOMY    . PROCTOSCOPY  02/03/2012   Procedure: PROCTOSCOPY;  Surgeon: Adin Hector, MD;  Location: [facility];  Service: General;;  . STOMACH SURGERY  02/2012  . TEE WITHOUT  CARDIOVERSION N/A 10/05/2016   Procedure: TRANSESOPHAGEAL  ECHOCARDIOGRAM (TEE);  Surgeon: Sanda Klein, MD;  Location: [facility] END[facility]COPY;  Service: Cardiovascular;  Laterality: N/A;    Family History  Problem Relation Age of Onset  . Hypertension Mother   . Diabetes Mother   . Cancer Mother        ? location  . Colon cancer Father 7  . Colon polyps Neg Hx     Social History:  reports that he has never smoked. He has never used smokeless tobacco. He reports that he does not drink alcohol or use drugs.   Review of Systems  Constitutional: Positive for weight gain.       Mild weight gain recently  HENT: Negative for headaches.   Eyes: Negative for blurred vision.  Respiratory: Negative for shortness of breath.   Cardiovascular: Negative for chest pain and leg swelling.  Gastrointestinal: Negative for diarrhea and abdominal pain.  Endocrine: Negative for fatigue.  Genitourinary: Positive for frequency and nocturia.  Musculoskeletal: Negative for joint pain.  Skin: Negative for rash.  Neurological: Positive for weakness, numbness and tingling.       He has had weakness in his legs especially left since his back surgery in January Has mild intermittent tingling and numbness in his legs but not feet  Psychiatric/Behavioral: Negative for insomnia.     Lipid history: Has been taking Zocor 40 mg for some time with good control    Lab Results  Component Value Date   CHOL 161 12/15/2016   HDL 52.10 12/15/2016   LDLCALC 87 12/15/2016   LDLDIRECT 118.0 11/11/2015   TRIG 108.0 12/15/2016   CHOLHDL 3 12/15/2016           Hypertension:Not present, previously on 5 mg lisinopril until 1/18  Most recent eye exam was In 03/2017  Most recent foot exam: 9/18    LABS:  No visits with results within 1 Week(s) from this visit.  Latest known visit with results is:  Office Visit on 06/13/2017  Component Date Value Ref Range Status  . Hemoglobin A1C 06/13/2017 9.1   Final    Physical Examination:  BP 138/80   Pulse 90   Ht 5\' 11"  (1.803  m)   Wt 194 lb 6.4 oz (88.2 kg)   SpO2 92%   BMI 27.11 kg/m   GENERAL:       He is averagely built and nourished  HEENT:         Eye exam shows normal external appearance. Fundus exam shows no retinopathy, right fundus not well seen. Oral exam shows normal mucosa .  NECK:   There is no lymphadenopathy Thyroid is not enlarged and no nodules felt.  Carotids are normal to palpation and no bruit heard LUNGS:         Chest is symmetrical. Lungs are clear to auscultation.Marland Kitchen   HEART:         Heart sounds:  S1 and S2 are normal. No murmur or click heard., no S3 or S4.   ABDOMEN:   There is no distention present. Liver and spleen are not palpable. No other mass or tenderness present.   NEUROLOGICAL:   Ankle jerks are absent bilaterally.    Diabetic Foot Exam - Simple   Simple Foot Form Diabetic Foot exam was performed with the following findings:  Yes   Visual Inspection No deformities, no ulcerations, no other skin breakdown bilaterally:  Yes See comments:  Yes Sensation Testing Intact to touch and monofilament testing  bilaterally:  Yes Pulse Check Posterior Tibialis and Dorsalis pulse intact bilaterally:  Yes Comments His second toe is deviated medially bilaterally overlapping the first toe            Vibration sense is Moderately reduced in distal first toes. MUSCULOSKELETAL:  There is no swelling or deformity of the peripheral joints.  EXTREMITIES:     There is no edema. No skin lesions present.Marland Kitchen SKIN:       No rash or lesions of concern.        ASSESSMENT:  Diabetes type 2, uncontrolled    His recent A1c has been 9.1 Not clear why his blood sugar control has been worse this year even with continuing the same regimen of basal bolus insulin He is not significantly obese and not requiring large doses of insulin  Currently he is checking blood sugars with a Generic monitor and not keeping consistent record, unable to review this to confirm his blood sugar patterns However he does  not appear to have any consistent blood sugar patterns despite high A1c He believes his fasting readings are recently better and is maybe from increasing his Lantus and starting [place] Reportedly has allergy to metformin He is generally compliant with diet and taking his insulin at the right times  Complications of diabetes: Mild neuropathy, no history of nephropathy or retinopathy  Mild hyperlipidemia: Well controlled  History of epidural abscess and subsequent leg weakness, difficulty in ambulation History of ?  Overactive bladder  PLAN:     Start using Accu-Chek meter for better accuracy.  Given Accu-Chek guide  He will check blood sugars by rotation at different times of the day including 2-3 hours after meals  Discussed that freestyle [place]or [place] is unlikely to be covered by Medicare unless he is taking at least 4 times a day which he currently is not  Since blood sugars may be improved with starting [place] and increasing Lantus will not change his regimen as yet  Assess his level of control but fructosamine today  Will review his blood sugars by download of meter on the next visit and make changes as needed; if his blood sugars are consistently high towards the evenings may consider changing Lantus to [place] or [place]  Discussed blood sugar targets at various times  Consider consultation with dietitian again  Check renal function and potassium with starting Farxiga  Patient Instructions  Check blood sugars on waking up  5/7 days  Also check blood sugars about 2-3 hours after a meal and do this after different meals by rotation  Recommended blood sugar levels on waking up is 90-130 and about 2 hours after meal is 130-160  Please bring your blood sugar monitor to each visit, thank you       Consultation note has been sent to the referring physician  [facility] 06/28/2017, 3:40 PM   Note: This office note was prepared with Dragon voice recognition system technology.  Any transcriptional errors that result from this process are unintentional.

## 2017-06-28 NOTE — Patient Instructions (Addendum)
Check blood sugars on waking up  5/7 days  Also check blood sugars about 2-3 hours after a meal and do this after different meals by rotation  Recommended blood sugar levels on waking up is 90-130 and about 2 hours after meal is 130-160  Please bring your blood sugar monitor to each visit, thank you

## 2017-06-29 LAB — FRUCTOSAMINE: Fructosamine: 382 umol/L — ABNORMAL HIGH (ref 0–285)

## 2017-07-04 DIAGNOSIS — M4620 Osteomyelitis of vertebra, site unspecified: Secondary | ICD-10-CM | POA: Diagnosis not present

## 2017-07-05 ENCOUNTER — Encounter: Payer: Medicare HMO | Attending: Internal Medicine | Admitting: Skilled Nursing Facility1

## 2017-07-05 ENCOUNTER — Encounter: Payer: Self-pay | Admitting: Skilled Nursing Facility1

## 2017-07-05 DIAGNOSIS — E118 Type 2 diabetes mellitus with unspecified complications: Secondary | ICD-10-CM

## 2017-07-05 DIAGNOSIS — M4620 Osteomyelitis of vertebra, site unspecified: Secondary | ICD-10-CM | POA: Diagnosis not present

## 2017-07-05 DIAGNOSIS — Z713 Dietary counseling and surveillance: Secondary | ICD-10-CM | POA: Insufficient documentation

## 2017-07-05 DIAGNOSIS — Z794 Long term (current) use of insulin: Secondary | ICD-10-CM | POA: Insufficient documentation

## 2017-07-05 NOTE — Patient Instructions (Addendum)
-  Aim for 4-5 bottles of water  -check your feet every day  -9 or less grams of sugar on the label  -Sugar free items are fine  -canned vegetables 1g or less of sugar

## 2017-07-05 NOTE — Progress Notes (Signed)
Pt states his insurance does not cover the freestyle libre. Pt states he has an Aid in the afternoon. Pt states he will do whatever it takes to control his blood sugar. Pt states he has been getting around 116-125 for blood sugars for the last 2 weeks. Pt is competent in his insulin regimen as well as checking his blood sugars. Pts brother helps take care of him. Pt arrives with a walker. Pt states he tries to move as much as he can throughout the day so he can get healthier and get back to driving. Pt understands hypoglycemia signs/symptoms and how to treat.    Goals: -Aim for 4-5 bottles of water  -check your feet every day  -9 or less grams of sugar on the label  -Sugar free items are fine  -canned vegetables 1g or less of sugar

## 2017-07-06 DIAGNOSIS — M4620 Osteomyelitis of vertebra, site unspecified: Secondary | ICD-10-CM | POA: Diagnosis not present

## 2017-07-07 DIAGNOSIS — M4620 Osteomyelitis of vertebra, site unspecified: Secondary | ICD-10-CM | POA: Diagnosis not present

## 2017-07-08 DIAGNOSIS — M4620 Osteomyelitis of vertebra, site unspecified: Secondary | ICD-10-CM | POA: Diagnosis not present

## 2017-07-09 DIAGNOSIS — M4620 Osteomyelitis of vertebra, site unspecified: Secondary | ICD-10-CM | POA: Diagnosis not present

## 2017-07-10 DIAGNOSIS — M4620 Osteomyelitis of vertebra, site unspecified: Secondary | ICD-10-CM | POA: Diagnosis not present

## 2017-07-11 DIAGNOSIS — M4620 Osteomyelitis of vertebra, site unspecified: Secondary | ICD-10-CM | POA: Diagnosis not present

## 2017-07-12 ENCOUNTER — Other Ambulatory Visit: Payer: Self-pay | Admitting: Family Medicine

## 2017-07-12 DIAGNOSIS — M4620 Osteomyelitis of vertebra, site unspecified: Secondary | ICD-10-CM | POA: Diagnosis not present

## 2017-07-12 NOTE — Telephone Encounter (Signed)
Refill denied.  Pt has completed course of treatment & can start taking OTC Vitamin D.

## 2017-07-13 ENCOUNTER — Ambulatory Visit (INDEPENDENT_AMBULATORY_CARE_PROVIDER_SITE_OTHER): Payer: Medicare HMO | Admitting: Podiatry

## 2017-07-13 ENCOUNTER — Encounter: Payer: Self-pay | Admitting: Podiatry

## 2017-07-13 ENCOUNTER — Telehealth: Payer: Self-pay | Admitting: Internal Medicine

## 2017-07-13 DIAGNOSIS — M79675 Pain in left toe(s): Secondary | ICD-10-CM

## 2017-07-13 DIAGNOSIS — M205X9 Other deformities of toe(s) (acquired), unspecified foot: Secondary | ICD-10-CM

## 2017-07-13 DIAGNOSIS — N3281 Overactive bladder: Secondary | ICD-10-CM

## 2017-07-13 DIAGNOSIS — M79674 Pain in right toe(s): Secondary | ICD-10-CM

## 2017-07-13 DIAGNOSIS — B351 Tinea unguium: Secondary | ICD-10-CM

## 2017-07-13 DIAGNOSIS — E119 Type 2 diabetes mellitus without complications: Secondary | ICD-10-CM

## 2017-07-13 DIAGNOSIS — M4620 Osteomyelitis of vertebra, site unspecified: Secondary | ICD-10-CM | POA: Diagnosis not present

## 2017-07-13 MED ORDER — OXYBUTYNIN CHLORIDE 5 MG PO TABS: ORAL_TABLET | ORAL | 1 refills | Status: DC

## 2017-07-13 NOTE — Telephone Encounter (Signed)
Reviewed chart pt is up-to-date sent refills to [facility].../lmb  

## 2017-07-13 NOTE — Telephone Encounter (Signed)
Pt called for a refill of his oxybutynin (DITROPAN) 5 MG tablet  Please advise   [facility] on [address]

## 2017-07-13 NOTE — Progress Notes (Signed)
   Subjective:    Patient ID: Charles Parker, male    DOB: 03/02/1946, 71 y.o.   MRN: [id number]  HPI this patient presents the office with chief complaint of long, ingrowing painful toenails on both feet. Patient is accompanied to the office by his brother. He states that his nails have become thick and long and painful walking and wearing his shoes. Patient is a diabetic taking gabapentin. He also admits to just having back surgery. He presents the office today for an evaluation and treatment of his feet.  Patient was evaluated for arthritis both big toes by orthopedic doctor.    Review of Systems  All other systems reviewed and are negative.      Objective:   Physical Exam GENERAL APPEARANCE: Alert, conversant. Appropriately groomed. No acute distress.  VASCULAR: Pedal pulses are  palpable at  [facility] and PT bilateral.  Capillary refill time is immediate to all digits,  Normal temperature gradient.   NEUROLOGIC: sensation is normal to 5.07 monofilament at 5/5 sites bilateral.  Light touch is intact bilateral, Muscle strength normal.  MUSCULOSKELETAL: acceptable muscle strength, tone and stability bilateral.  Intrinsic muscluature intact bilateral.  DJD 1st MPJ  B/L. Overlapping second toe  B/l  DERMATOLOGIC: skin color, texture, and turgor are within normal limits.  No preulcerative lesions or ulcers  are seen, no interdigital maceration noted.  No open lesions present.   No drainage noted.  NAILS  Thick disfigured discolored nails both feet.         Assessment & Plan:  Onychomycosis  B/L  Debridement of long thick painful nails.  RTC 3 months.   Gardiner Barefoot DPM

## 2017-07-14 DIAGNOSIS — M4620 Osteomyelitis of vertebra, site unspecified: Secondary | ICD-10-CM | POA: Diagnosis not present

## 2017-07-15 DIAGNOSIS — M4620 Osteomyelitis of vertebra, site unspecified: Secondary | ICD-10-CM | POA: Diagnosis not present

## 2017-07-16 DIAGNOSIS — M4620 Osteomyelitis of vertebra, site unspecified: Secondary | ICD-10-CM | POA: Diagnosis not present

## 2017-07-17 DIAGNOSIS — M4620 Osteomyelitis of vertebra, site unspecified: Secondary | ICD-10-CM | POA: Diagnosis not present

## 2017-07-18 DIAGNOSIS — M4620 Osteomyelitis of vertebra, site unspecified: Secondary | ICD-10-CM | POA: Diagnosis not present

## 2017-07-19 DIAGNOSIS — M4620 Osteomyelitis of vertebra, site unspecified: Secondary | ICD-10-CM | POA: Diagnosis not present

## 2017-07-20 DIAGNOSIS — M4620 Osteomyelitis of vertebra, site unspecified: Secondary | ICD-10-CM | POA: Diagnosis not present

## 2017-07-21 DIAGNOSIS — M4620 Osteomyelitis of vertebra, site unspecified: Secondary | ICD-10-CM | POA: Diagnosis not present

## 2017-07-22 DIAGNOSIS — M4620 Osteomyelitis of vertebra, site unspecified: Secondary | ICD-10-CM | POA: Diagnosis not present

## 2017-07-23 DIAGNOSIS — M4620 Osteomyelitis of vertebra, site unspecified: Secondary | ICD-10-CM | POA: Diagnosis not present

## 2017-07-24 DIAGNOSIS — M4624 Osteomyelitis of vertebra, thoracic region: Secondary | ICD-10-CM | POA: Diagnosis not present

## 2017-07-24 DIAGNOSIS — E139 Other specified diabetes mellitus without complications: Secondary | ICD-10-CM | POA: Diagnosis not present

## 2017-07-24 DIAGNOSIS — R2689 Other abnormalities of gait and mobility: Secondary | ICD-10-CM | POA: Diagnosis not present

## 2017-07-24 DIAGNOSIS — M4324 Fusion of spine, thoracic region: Secondary | ICD-10-CM | POA: Diagnosis not present

## 2017-07-24 DIAGNOSIS — Z981 Arthrodesis status: Secondary | ICD-10-CM | POA: Diagnosis not present

## 2017-07-24 DIAGNOSIS — G062 Extradural and subdural abscess, unspecified: Secondary | ICD-10-CM | POA: Diagnosis not present

## 2017-07-31 DIAGNOSIS — M4620 Osteomyelitis of vertebra, site unspecified: Secondary | ICD-10-CM | POA: Diagnosis not present

## 2017-08-01 DIAGNOSIS — M4620 Osteomyelitis of vertebra, site unspecified: Secondary | ICD-10-CM | POA: Diagnosis not present

## 2017-08-02 DIAGNOSIS — M4620 Osteomyelitis of vertebra, site unspecified: Secondary | ICD-10-CM | POA: Diagnosis not present

## 2017-08-03 DIAGNOSIS — M4620 Osteomyelitis of vertebra, site unspecified: Secondary | ICD-10-CM | POA: Diagnosis not present

## 2017-08-04 ENCOUNTER — Ambulatory Visit (INDEPENDENT_AMBULATORY_CARE_PROVIDER_SITE_OTHER): Payer: Medicare HMO | Admitting: Internal Medicine

## 2017-08-04 ENCOUNTER — Encounter: Payer: Self-pay | Admitting: Internal Medicine

## 2017-08-04 VITALS — BP 130/90 | HR 76 | Temp 98.0°F | Ht 71.0 in | Wt 196.2 lb

## 2017-08-04 DIAGNOSIS — M4620 Osteomyelitis of vertebra, site unspecified: Secondary | ICD-10-CM | POA: Diagnosis not present

## 2017-08-04 DIAGNOSIS — M1712 Unilateral primary osteoarthritis, left knee: Secondary | ICD-10-CM | POA: Diagnosis not present

## 2017-08-04 NOTE — Progress Notes (Signed)
Subjective:  Patient ID: Charles Parker, male    DOB: 05/19/1946  Age: 71 y.o. MRN: [id number]  CC: Back Pain and Osteoarthritis   HPI DELLAS GUARD presents for follow-up- he requests that I send an order for physical therapy to evaluate him in his home.  Over the last year he has had trouble using his left lower extremity due to left knee arthritis and an episode of lumbar discitis that caused a radiculopathy.  He was told that he could not drive but now he states that his symptoms have improved and he was told by a physical therapist that if they evaluated him again and documented improvement then they might approve him to start driving again.  Outpatient Medications Prior to Visit  Medication Sig Dispense Refill  . ACCU-CHEK FASTCLIX LANCETS MISC Use to check blood sugars 3 times daily. Dx Code E11.8 102 each 3  . Continuous Blood Gluc Receiver (FREESTYLE LIBRE READER) DEVI 1 Act by Does not apply route 3 (three) times daily with meals. 1 Device 1  . Continuous Blood Gluc Sensor (FREESTYLE LIBRE SENSOR SYSTEM) MISC 1 Act by Does not apply route 3 (three) times daily before meals. 1 each 1  . dapagliflozin propanediol (FARXIGA) 5 MG TABS tablet Take 5 mg by mouth daily. 30 tablet 5  . ferrous sulfate 325 (65 FE) MG tablet Take 1 tablet (325 mg total) by mouth 2 (two) times daily with a meal. 180 tablet 1  . glucose blood (ACCU-CHEK GUIDE) test strip Use to check blood sugars 3 times daily. Dx Code E11.8 100 each 3  . insulin aspart (NOVOLOG FLEXPEN) 100 UNIT/ML FlexPen Inject 10 Units into the skin 3 (three) times daily with meals. 15 mL 11  . insulin glargine (LANTUS) 100 UNIT/ML injection Inject 0.3 mLs (30 Units total) into the skin at bedtime. 10 mL 5  . Insulin Pen Needle 31G X 5 MM MISC Use with both insulin pens 400 each 1  . oxybutynin (DITROPAN) 5 MG tablet TAKE 1 TABLET(5 MG) BY MOUTH DAILY 90 tablet 1  . rivaroxaban (XARELTO) 20 MG TABS tablet Take 1 tablet (20 mg total) by  mouth daily with supper. 90 tablet 3  . simvastatin (ZOCOR) 40 MG tablet Take 1 tablet (40 mg total) by mouth at bedtime. 90 tablet 3  . tamsulosin (FLOMAX) 0.4 MG CAPS capsule Take 1 capsule (0.4 mg total) by mouth daily after breakfast. 90 capsule 1  . TRUE METRIX BLOOD GLUCOSE TEST test strip USE TO CHECK BLOOD SUGARS TWICE DAILY 200 each 3  . Vitamin D, Ergocalciferol, (DRISDOL) 50000 units CAPS capsule Take 1 capsule (50,000 Units total) by mouth every 7 (seven) days. 12 capsule 0  . gabapentin (NEURONTIN) 300 MG capsule Take 1 capsule (300 mg total) by mouth 2 (two) times daily. 180 capsule 1   No facility-administered medications prior to visit.     ROS Review of Systems  Constitutional: Negative for chills, diaphoresis and fatigue.  HENT: Negative.  Negative for trouble swallowing.   Eyes: Negative.  Negative for visual disturbance.  Respiratory: Negative.  Negative for cough, chest tightness, shortness of breath and wheezing.   Cardiovascular: Negative.  Negative for chest pain, palpitations and leg swelling.  Gastrointestinal: Negative for abdominal pain, constipation, diarrhea, nausea and vomiting.  Genitourinary: Negative.  Negative for difficulty urinating.  Musculoskeletal: Positive for arthralgias. Negative for joint swelling and myalgias.  Skin: Negative.  Negative for color change.  Allergic/Immunologic: Negative.   Neurological: Positive  for weakness. Negative for dizziness and numbness.  Hematological: Negative for adenopathy. Does not bruise/bleed easily.  Psychiatric/Behavioral: Negative.     Objective:  BP 130/90 (BP Location: Left Arm, Patient Position: Sitting, Cuff Size: Large)   Pulse 76   Temp 98 F (36.7 C) (Oral)   Ht 5\' 11"  (1.803 m)   Wt 196 lb 4 oz (89 kg)   SpO2 94%   BMI 27.37 kg/m   BP Readings from Last 3 Encounters:  08/04/17 130/90  06/28/17 138/80  06/13/17 136/88    Wt Readings from Last 3 Encounters:  08/04/17 196 lb 4 oz (89 kg)    06/28/17 194 lb 6.4 oz (88.2 kg)  06/13/17 195 lb (88.5 kg)    Physical Exam  Constitutional: He is oriented to person, place, and time. No distress.  HENT:  Mouth/Throat: Oropharynx is clear and moist. No oropharyngeal exudate.  Eyes: Conjunctivae are normal. Right eye exhibits no discharge. Left eye exhibits no discharge. No scleral icterus.  Neck: Normal range of motion. Neck supple. No JVD present.  Cardiovascular: Normal rate, regular rhythm and intact distal pulses. Exam reveals no gallop and no friction rub.  No murmur heard. Pulmonary/Chest: Effort normal and breath sounds normal. No respiratory distress. He has no wheezes. He has no rales. He exhibits no tenderness.  Abdominal: Soft. Bowel sounds are normal. He exhibits no distension and no mass. There is no tenderness. There is no rebound and no guarding.  Musculoskeletal: Normal range of motion. He exhibits no edema.       Left knee: He exhibits deformity (DJD). He exhibits normal range of motion, no swelling, no effusion, no erythema and no bony tenderness. No tenderness found.  Lymphadenopathy:    He has no cervical adenopathy.  Neurological: He is alert and oriented to person, place, and time.  Skin: Skin is warm and dry. No rash noted. He is not diaphoretic. No erythema. No pallor.  Vitals reviewed.   Lab Results  Component Value Date   WBC 4.2 03/24/2017   HGB 14.9 03/24/2017   HCT 44.5 03/24/2017   PLT 154 03/24/2017   GLUCOSE 203 (H) 08/05/2017   CHOL 161 12/15/2016   TRIG 108.0 12/15/2016   HDL 52.10 12/15/2016   LDLDIRECT 118.0 11/11/2015   LDLCALC 87 12/15/2016   ALT 27 08/05/2017   AST 21 08/05/2017   NA 139 08/05/2017   K 4.2 08/05/2017   CL 104 08/05/2017   CREATININE 1.07 08/05/2017   BUN 17 08/05/2017   CO2 28 08/05/2017   TSH 0.67 09/08/2016   PSA 1.49 08/17/2016   INR 1.37 09/26/2016   HGBA1C 9.1 06/13/2017   MICROALBUR 1.0 02/08/2017    No results found.  Assessment & Plan:   Cruz  was seen today for back pain and osteoarthritis.  Diagnoses and all orders for this visit:  Primary osteoarthritis of left knee- I have sent a referral to [facility] to see if they will send a physical therapist to his home to assess whether or not he is capable of driving again. -     Ambulatory referral to [facility] am having Tami Barren. Franssen "A.D." maintain his insulin aspart, TRUE METRIX BLOOD GLUCOSE TEST, Insulin Pen Needle, rivaroxaban, simvastatin, tamsulosin, ferrous sulfate, Vitamin D (Ergocalciferol), dapagliflozin propanediol, insulin glargine, FREESTYLE LIBRE SENSOR SYSTEM, FREESTYLE LIBRE READER, glucose blood, ACCU-CHEK FASTCLIX LANCETS, and oxybutynin.  No orders of the defined types were placed in this encounter.    Follow-up: Return if  symptoms worsen or fail to improve.  Scarlette Calico, MD

## 2017-08-04 NOTE — Patient Instructions (Signed)
Arthritis Arthritis means joint pain. It can also mean joint disease. A joint is a place where bones come together. People who have arthritis may have:  Red joints.  Swollen joints.  Stiff joints.  Warm joints.  A fever.  A feeling of being sick.  Follow these instructions at home: Pay attention to any changes in your symptoms. Take these actions to help with your pain and swelling. Medicines  Take over-the-counter and prescription medicines only as told by your doctor.  Do not take aspirin for pain if your doctor says that you may have gout. Activity  Rest your joint if your doctor tells you to.  Avoid activities that make the pain worse.  Exercise your joint regularly as told by your doctor. Try doing exercises like: ? Swimming. ? Water aerobics. ? Biking. ? Walking. Joint Care   If your joint is swollen, keep it raised (elevated) if told by your doctor.  If your joint feels stiff in the morning, try taking a warm shower.  If you have diabetes, do not apply heat without asking your doctor.  If told, apply heat to the joint: ? Put a towel between the joint and the hot pack or heating pad. ? Leave the heat on the area for 20-30 minutes.  If told, apply ice to the joint: ? Put ice in a plastic bag. ? Place a towel between your skin and the bag. ? Leave the ice on for 20 minutes, 2-3 times per day.  Keep all follow-up visits as told by your doctor. Contact a doctor if:  The pain gets worse.  You have a fever. Get help right away if:  You have very bad pain in your joint.  You have swelling in your joint.  Your joint is red.  Many joints become painful and swollen.  You have very bad back pain.  Your leg is very weak.  You cannot control your pee (urine) or poop (stool). This information is not intended to replace advice given to you by your health care provider. Make sure you discuss any questions you have with your health care provider. Document  Released: 12/15/2009 Document Revised: 02/26/2016 Document Reviewed: 12/16/2014 Elsevier Interactive Patient Education  2018 Elsevier Inc.  

## 2017-08-05 ENCOUNTER — Other Ambulatory Visit: Payer: Self-pay | Admitting: Internal Medicine

## 2017-08-05 ENCOUNTER — Other Ambulatory Visit (INDEPENDENT_AMBULATORY_CARE_PROVIDER_SITE_OTHER): Payer: Medicare HMO

## 2017-08-05 DIAGNOSIS — Z794 Long term (current) use of insulin: Secondary | ICD-10-CM

## 2017-08-05 DIAGNOSIS — E1165 Type 2 diabetes mellitus with hyperglycemia: Secondary | ICD-10-CM | POA: Diagnosis not present

## 2017-08-05 DIAGNOSIS — M4620 Osteomyelitis of vertebra, site unspecified: Secondary | ICD-10-CM | POA: Diagnosis not present

## 2017-08-05 LAB — COMPREHENSIVE METABOLIC PANEL
ALK PHOS: 61 U/L (ref 39–117)
ALT: 27 U/L (ref 0–53)
AST: 21 U/L (ref 0–37)
Albumin: 4.4 g/dL (ref 3.5–5.2)
BILIRUBIN TOTAL: 0.7 mg/dL (ref 0.2–1.2)
BUN: 17 mg/dL (ref 6–23)
CO2: 28 meq/L (ref 19–32)
Calcium: 9.7 mg/dL (ref 8.4–10.5)
Chloride: 104 mEq/L (ref 96–112)
Creatinine, Ser: 1.07 mg/dL (ref 0.40–1.50)
GFR: 87.51 mL/min (ref >60.00)
GLUCOSE: 203 mg/dL — AB (ref 70–99)
Potassium: 4.2 mEq/L (ref 3.5–5.1)
SODIUM: 139 meq/L (ref 135–145)
TOTAL PROTEIN: 7.6 g/dL (ref 6.0–8.3)

## 2017-08-06 DIAGNOSIS — M4620 Osteomyelitis of vertebra, site unspecified: Secondary | ICD-10-CM | POA: Diagnosis not present

## 2017-08-06 LAB — FRUCTOSAMINE: Fructosamine: 334 umol/L — ABNORMAL HIGH (ref 0–285)

## 2017-08-07 ENCOUNTER — Encounter: Payer: Self-pay | Admitting: Internal Medicine

## 2017-08-08 DIAGNOSIS — M4620 Osteomyelitis of vertebra, site unspecified: Secondary | ICD-10-CM | POA: Diagnosis not present

## 2017-08-09 ENCOUNTER — Ambulatory Visit (INDEPENDENT_AMBULATORY_CARE_PROVIDER_SITE_OTHER): Payer: Medicare HMO | Admitting: Endocrinology

## 2017-08-09 ENCOUNTER — Encounter: Payer: Self-pay | Admitting: Endocrinology

## 2017-08-09 VITALS — BP 130/82 | HR 74 | Ht 71.0 in | Wt 195.2 lb

## 2017-08-09 DIAGNOSIS — Z794 Long term (current) use of insulin: Secondary | ICD-10-CM

## 2017-08-09 DIAGNOSIS — E1165 Type 2 diabetes mellitus with hyperglycemia: Secondary | ICD-10-CM

## 2017-08-09 DIAGNOSIS — E118 Type 2 diabetes mellitus with unspecified complications: Secondary | ICD-10-CM | POA: Diagnosis not present

## 2017-08-09 MED ORDER — DAPAGLIFLOZIN PROPANEDIOL 10 MG PO TABS: 10.0000 mg | ORAL_TABLET | Freq: Every day | ORAL | 3 refills | Status: DC

## 2017-08-09 NOTE — Patient Instructions (Addendum)
Breakfast and Supper NOVOLOG 12 units  LANTUS 32 units  Check blood sugars on waking up  3/7 days  Also check blood sugars about 2 hours after a meal and do this after different meals by rotation  Recommended blood sugar levels on waking up is 90-130 and about 2 hours after meal is 130-160  Please bring your blood sugar monitor to each visit, thank you  Farxiga 2 of the 5nmg  Egg or other protein in am

## 2017-08-09 NOTE — Progress Notes (Signed)
Patient ID: Charles Parker, male   DOB: 1946-06-23, 71 y.o.   MRN: [id number]          Reason for Appointment: Follow-up for Type 2 Diabetes  Referring physician: Scarlette Calico   History of Present Illness:          Date of diagnosis of type 2 diabetes mellitus: 2014        Background history:   He has been on various oral hypoglycemic drugs since the onset including metformin, Januvia and Amaryl Was also on Invokana in 2015 for about a year but not clear why this was stopped His level of control has been quite variable with highest A1c 13.6 in 06/2015  He has been on insulin since about 10/2014 as documented in his record  Recent history:   INSULIN regimen is:  Lantus 30  units bedtime with syringe, NovoLog 10 units before meals    Non-insulin hypoglycemic drugs the patient is taking GYI:RSWNIOE 5 mg daily  His most recent A1c was 9.1 done in 06/2017 Recent fructosamine is 334   Current management, blood sugar patterns and problems identified:  Since his blood sugars were looking fairly good with starting Wilder Glade on his initial consultation no dosage changes were made  Also he has been  using an Accu-Chek meter since his initial consultation instead of Generic but he did not bring this  Blood sugars were reviewed from his diary but these are not organized by time of day  HIGHEST blood sugars appear to be either at lunchtime or in the morning, more consistently around lunchtime and his glucose was 203 after breakfast in the lab  Blood sugars are variable in the morning but mostly high  He is not checking blood sugars after supper which is his main meal  No side effects from [place]  He is only eating oatmeal without protein usually in the morning  This is despite talking to the dietitian about a month ago        Side effects from medications have been: Hives from metformin  Compliance with the medical regimen: Good  Glucose monitoring:  done 2-3 times a   day  usually        Glucometer: Accu-Chek guide      Blood Glucose readings by Home diary   Mean values apply above for all meters except median for One Touch  PRE-MEAL Fasting Lunch Dinner Bedtime Overall  Glucose range: 129-180 92-180 137-172    Mean/median:        Self-care: The diet that the patient has been following is: tries to limit [employer] .      Typical meal intake: Breakfast is cereal or oatmeal usually, lunch: Cold cut sandwiches.  Dinner chicken and vegetables.  Snacks will be chips, peanut butter crackers Supper at 6 pm                Dietician visit, most recent:10/18               Exercise: some walking was not able to do much because of leg weakness   Weight history:  Wt Readings from Last 3 Encounters:  08/09/17 195 lb 3.2 oz (88.5 kg)  08/04/17 196 lb 4 oz (89 kg)  06/28/17 194 lb 6.4 oz (88.2 kg)    Glycemic control:   Lab Results  Component Value Date   HGBA1C 9.1 06/13/2017   HGBA1C 7.5 (H) 02/08/2017   HGBA1C 7.3 (H) 10/21/2016   Lab Results  Component  Value Date   MICROALBUR 1.0 02/08/2017   LDLCALC 87 12/15/2016   CREATININE 1.07 08/05/2017   Lab Results  Component Value Date   MICRALBCREAT 0.5 02/08/2017    Lab Results  Component Value Date   FRUCTOSAMINE 334 (H) 08/05/2017   FRUCTOSAMINE 382 (H) 06/28/2017      Allergies as of 08/09/2017      Reactions   Metformin And Related Diarrhea   Codeine Rash   All over the body      Medication List        Accurate as of 08/09/17  9:42 AM. Always use your most recent med list.          ACCU-CHEK FASTCLIX LANCETS Misc Use to check blood sugars 3 times daily. Dx Code E11.8   dapagliflozin propanediol 5 MG Tabs tablet Commonly known as:  FARXIGA Take 5 mg by mouth daily.   ferrous sulfate 325 (65 FE) MG tablet Take 1 tablet (325 mg total) by mouth 2 (two) times daily with a meal.   FREESTYLE LIBRE READER Devi 1 Act by Does not apply route 3 (three) times daily with meals.     [place] Misc 1 Act by Does not apply route 3 (three) times daily before meals.   gabapentin 300 MG capsule Commonly known as:  NEURONTIN TAKE 1 CAPSULE(300 MG) BY MOUTH TWICE DAILY   insulin aspart 100 UNIT/ML FlexPen Commonly known as:  NOVOLOG FLEXPEN Inject 10 Units into the skin 3 (three) times daily with meals.   insulin glargine 100 UNIT/ML injection Commonly known as:  LANTUS Inject 0.3 mLs (30 Units total) into the skin at bedtime.   Insulin Pen Needle 31G X 5 MM Misc Use with both insulin pens   oxybutynin 5 MG tablet Commonly known as:  DITROPAN TAKE 1 TABLET(5 MG) BY MOUTH DAILY   rivaroxaban 20 MG Tabs tablet Commonly known as:  XARELTO Take 1 tablet (20 mg total) by mouth daily with supper.   simvastatin 40 MG tablet Commonly known as:  ZOCOR Take 1 tablet (40 mg total) by mouth at bedtime.   tamsulosin 0.4 MG Caps capsule Commonly known as:  FLOMAX Take 1 capsule (0.4 mg total) by mouth daily after breakfast.   TRUE METRIX BLOOD GLUCOSE TEST test strip Generic drug:  glucose blood USE TO CHECK BLOOD SUGARS TWICE DAILY   glucose blood test strip Commonly known as:  ACCU-CHEK GUIDE Use to check blood sugars 3 times daily. Dx Code E11.8   Vitamin D (Ergocalciferol) 50000 units Caps capsule Commonly known as:  DRISDOL Take 1 capsule (50,000 Units total) by mouth every 7 (seven) days.       Allergies:  Allergies  Allergen Reactions  . Metformin And Related Diarrhea  . Codeine Rash    All over the body    Past Medical History:  Diagnosis Date  . Clotting disorder (HCC)    DVT  . Diabetes mellitus    type II  . Diverticulosis   . DVT (deep venous thrombosis) ([place])   . Glaucoma    no eye drops   . Hepatic cyst   . Hypercholesterolemia   . Hypertension   . Myocardial infarction ([place])    very mild long ago   . Renal cyst   . Sleep apnea   . Tubulovillous adenoma     Past Surgical History:  Procedure Laterality  Date  . COLONOSCOPY  02/2013   polyp removal(mult.)  . KNEE SURGERY  2004  left  . POLYPECTOMY    . STOMACH SURGERY  02/2012    Family History  Problem Relation Age of Onset  . Hypertension Mother   . Diabetes Mother   . Cancer Mother        ? location  . Colon cancer Father 64  . Colon polyps Neg Hx     Social History:  reports that  has never smoked. he has never used smokeless tobacco. He reports that he does not drink alcohol or use drugs.   Review of Systems     Lipid history: Has been taking Zocor 40 mg with good control    Lab Results  Component Value Date   CHOL 161 12/15/2016   HDL 52.10 12/15/2016   LDLCALC 87 12/15/2016   LDLDIRECT 118.0 11/11/2015   TRIG 108.0 12/15/2016   CHOLHDL 3 12/15/2016           Hypertension:Not present, previously on 5 mg lisinopril until 1/18  Most recent eye exam was In 03/2017  Most recent foot exam: 9/18    LABS:  Lab on 08/05/2017  Component Date Value Ref Range Status  . Sodium 08/05/2017 139  135 - 145 mEq/L Final  . Potassium 08/05/2017 4.2  3.5 - 5.1 mEq/L Final  . Chloride 08/05/2017 104  96 - 112 mEq/L Final  . CO2 08/05/2017 28  19 - 32 mEq/L Final  . Glucose, Bld 08/05/2017 203* 70 - 99 mg/dL Final  . BUN 08/05/2017 17  6 - 23 mg/dL Final  . Creatinine, Ser 08/05/2017 1.07  0.40 - 1.50 mg/dL Final  . Total Bilirubin 08/05/2017 0.7  0.2 - 1.2 mg/dL Final  . Alkaline Phosphatase 08/05/2017 61  39 - 117 U/L Final  . AST 08/05/2017 21  0 - 37 U/L Final  . ALT 08/05/2017 27  0 - 53 U/L Final  . Total Protein 08/05/2017 7.6  6.0 - 8.3 g/dL Final  . Albumin 08/05/2017 4.4  3.5 - 5.2 g/dL Final  . Calcium 08/05/2017 9.7  8.4 - 10.5 mg/dL Final  . GFR 08/05/2017 87.51  >60.00 mL/min Final  . Fructosamine 08/05/2017 334* 0 - 285 umol/L Final   Comment: Published reference interval for apparently healthy subjects between age 16 and 54 is 94 - 285 umol/L and in a poorly controlled diabetic population is 228 -  563 umol/L with a mean of 396 umol/L.     Physical Examination:  BP 130/82   Pulse 74   Ht 5\' 11"  (1.803 m)   Wt 195 lb 3.2 oz (88.5 kg)   SpO2 97%   BMI 27.22 kg/m          ASSESSMENT:  Diabetes type 2, uncontrolled    See history of present illness for detailed discussion of current diabetes management, blood sugar patterns and problems identified   His  most recent A1c has been 9. His fructosamine is still somewhat high at 334 His blood sugars are probably averaging about 160-170 at home but these are being checked only before meals Fasting readings are also mostly high with current regimen of 30 Lantus Lowest readings are probably before supper time Appears to be benefiting from [place] in combination his insulin without any renal dysfunction   PLAN:     Start  checking blood sugars after supper, written instructions given  He was also given a blood sugar diary to keep records  Increase Farxiga to 10 mg  At least for now we can increase his NovoLog by  2 units at breakfast and suppertime and Lantus by 2 units but will need further adjustment  Start having protein with every meal including breakfast  Check A1c on the next visit   Check renal function and potassiuagain with increasing Farxiga to 10 mg   Patient Instructions  Breakfast and Supper NOVOLOG 12 units  LANTUS 32 units  Check blood sugars on waking up  3/7 days  Also check blood sugars about 2 hours after a meal and do this after different meals by rotation  Recommended blood sugar levels on waking up is 90-130 and about 2 hours after meal is 130-160  Please bring your blood sugar monitor to each visit, thank you  Farxiga 2 of the 5nmg  Egg or other protein in am     Consultation note has been sent to the referring physician  [facility] 08/09/2017, 9:42 AM   Note: This office note was prepared with Dragon voice recognition system technology. Any transcriptional errors that result from  this process are unintentional.

## 2017-08-10 DIAGNOSIS — M4620 Osteomyelitis of vertebra, site unspecified: Secondary | ICD-10-CM | POA: Diagnosis not present

## 2017-08-11 DIAGNOSIS — M4620 Osteomyelitis of vertebra, site unspecified: Secondary | ICD-10-CM | POA: Diagnosis not present

## 2017-08-12 DIAGNOSIS — M4620 Osteomyelitis of vertebra, site unspecified: Secondary | ICD-10-CM | POA: Diagnosis not present

## 2017-08-13 DIAGNOSIS — M4620 Osteomyelitis of vertebra, site unspecified: Secondary | ICD-10-CM | POA: Diagnosis not present

## 2017-08-14 DIAGNOSIS — M4620 Osteomyelitis of vertebra, site unspecified: Secondary | ICD-10-CM | POA: Diagnosis not present

## 2017-08-15 DIAGNOSIS — Z794 Long term (current) use of insulin: Secondary | ICD-10-CM | POA: Diagnosis not present

## 2017-08-15 DIAGNOSIS — Z7984 Long term (current) use of oral hypoglycemic drugs: Secondary | ICD-10-CM | POA: Diagnosis not present

## 2017-08-15 DIAGNOSIS — Z7901 Long term (current) use of anticoagulants: Secondary | ICD-10-CM | POA: Diagnosis not present

## 2017-08-15 DIAGNOSIS — M1712 Unilateral primary osteoarthritis, left knee: Secondary | ICD-10-CM | POA: Diagnosis not present

## 2017-08-15 DIAGNOSIS — E119 Type 2 diabetes mellitus without complications: Secondary | ICD-10-CM | POA: Diagnosis not present

## 2017-08-15 DIAGNOSIS — Z86718 Personal history of other venous thrombosis and embolism: Secondary | ICD-10-CM | POA: Diagnosis not present

## 2017-08-15 DIAGNOSIS — M4620 Osteomyelitis of vertebra, site unspecified: Secondary | ICD-10-CM | POA: Diagnosis not present

## 2017-08-16 ENCOUNTER — Telehealth: Payer: Self-pay | Admitting: Internal Medicine

## 2017-08-16 DIAGNOSIS — M4620 Osteomyelitis of vertebra, site unspecified: Secondary | ICD-10-CM | POA: Diagnosis not present

## 2017-08-16 NOTE — Telephone Encounter (Signed)
Requesting verbal for therapy two times a week for five weeks and one time a week for four weeks.

## 2017-08-16 NOTE — Telephone Encounter (Signed)
yes

## 2017-08-16 NOTE — Telephone Encounter (Signed)
Notified Gerilynn w/MD response.Marland KitchenJohny Chess

## 2017-08-17 DIAGNOSIS — M4620 Osteomyelitis of vertebra, site unspecified: Secondary | ICD-10-CM | POA: Diagnosis not present

## 2017-08-18 DIAGNOSIS — Z7901 Long term (current) use of anticoagulants: Secondary | ICD-10-CM | POA: Diagnosis not present

## 2017-08-18 DIAGNOSIS — M1712 Unilateral primary osteoarthritis, left knee: Secondary | ICD-10-CM | POA: Diagnosis not present

## 2017-08-18 DIAGNOSIS — Z7984 Long term (current) use of oral hypoglycemic drugs: Secondary | ICD-10-CM | POA: Diagnosis not present

## 2017-08-18 DIAGNOSIS — Z794 Long term (current) use of insulin: Secondary | ICD-10-CM | POA: Diagnosis not present

## 2017-08-18 DIAGNOSIS — Z86718 Personal history of other venous thrombosis and embolism: Secondary | ICD-10-CM | POA: Diagnosis not present

## 2017-08-18 DIAGNOSIS — E119 Type 2 diabetes mellitus without complications: Secondary | ICD-10-CM | POA: Diagnosis not present

## 2017-08-21 DIAGNOSIS — M4620 Osteomyelitis of vertebra, site unspecified: Secondary | ICD-10-CM | POA: Diagnosis not present

## 2017-08-22 DIAGNOSIS — Z86718 Personal history of other venous thrombosis and embolism: Secondary | ICD-10-CM | POA: Diagnosis not present

## 2017-08-22 DIAGNOSIS — E119 Type 2 diabetes mellitus without complications: Secondary | ICD-10-CM | POA: Diagnosis not present

## 2017-08-22 DIAGNOSIS — Z794 Long term (current) use of insulin: Secondary | ICD-10-CM | POA: Diagnosis not present

## 2017-08-22 DIAGNOSIS — Z7901 Long term (current) use of anticoagulants: Secondary | ICD-10-CM | POA: Diagnosis not present

## 2017-08-22 DIAGNOSIS — M1712 Unilateral primary osteoarthritis, left knee: Secondary | ICD-10-CM | POA: Diagnosis not present

## 2017-08-22 DIAGNOSIS — Z7984 Long term (current) use of oral hypoglycemic drugs: Secondary | ICD-10-CM | POA: Diagnosis not present

## 2017-08-24 DIAGNOSIS — M4620 Osteomyelitis of vertebra, site unspecified: Secondary | ICD-10-CM | POA: Diagnosis not present

## 2017-08-24 DIAGNOSIS — R2689 Other abnormalities of gait and mobility: Secondary | ICD-10-CM | POA: Diagnosis not present

## 2017-08-24 DIAGNOSIS — M4324 Fusion of spine, thoracic region: Secondary | ICD-10-CM | POA: Diagnosis not present

## 2017-08-24 DIAGNOSIS — M4624 Osteomyelitis of vertebra, thoracic region: Secondary | ICD-10-CM | POA: Diagnosis not present

## 2017-08-24 DIAGNOSIS — E139 Other specified diabetes mellitus without complications: Secondary | ICD-10-CM | POA: Diagnosis not present

## 2017-08-24 DIAGNOSIS — G062 Extradural and subdural abscess, unspecified: Secondary | ICD-10-CM | POA: Diagnosis not present

## 2017-08-24 DIAGNOSIS — Z981 Arthrodesis status: Secondary | ICD-10-CM | POA: Diagnosis not present

## 2017-08-26 DIAGNOSIS — M4620 Osteomyelitis of vertebra, site unspecified: Secondary | ICD-10-CM | POA: Diagnosis not present

## 2017-08-26 DIAGNOSIS — Z7901 Long term (current) use of anticoagulants: Secondary | ICD-10-CM | POA: Diagnosis not present

## 2017-08-26 DIAGNOSIS — Z7984 Long term (current) use of oral hypoglycemic drugs: Secondary | ICD-10-CM | POA: Diagnosis not present

## 2017-08-26 DIAGNOSIS — M1712 Unilateral primary osteoarthritis, left knee: Secondary | ICD-10-CM | POA: Diagnosis not present

## 2017-08-26 DIAGNOSIS — E119 Type 2 diabetes mellitus without complications: Secondary | ICD-10-CM | POA: Diagnosis not present

## 2017-08-26 DIAGNOSIS — Z86718 Personal history of other venous thrombosis and embolism: Secondary | ICD-10-CM | POA: Diagnosis not present

## 2017-08-26 DIAGNOSIS — Z794 Long term (current) use of insulin: Secondary | ICD-10-CM | POA: Diagnosis not present

## 2017-08-27 DIAGNOSIS — M4620 Osteomyelitis of vertebra, site unspecified: Secondary | ICD-10-CM | POA: Diagnosis not present

## 2017-08-28 DIAGNOSIS — M4620 Osteomyelitis of vertebra, site unspecified: Secondary | ICD-10-CM | POA: Diagnosis not present

## 2017-08-29 DIAGNOSIS — M4620 Osteomyelitis of vertebra, site unspecified: Secondary | ICD-10-CM | POA: Diagnosis not present

## 2017-08-31 DIAGNOSIS — M4620 Osteomyelitis of vertebra, site unspecified: Secondary | ICD-10-CM | POA: Diagnosis not present

## 2017-09-01 ENCOUNTER — Telehealth: Payer: Self-pay

## 2017-09-01 DIAGNOSIS — Z7901 Long term (current) use of anticoagulants: Secondary | ICD-10-CM | POA: Diagnosis not present

## 2017-09-01 DIAGNOSIS — E119 Type 2 diabetes mellitus without complications: Secondary | ICD-10-CM | POA: Diagnosis not present

## 2017-09-01 DIAGNOSIS — E113319 Type 2 diabetes mellitus with moderate nonproliferative diabetic retinopathy with macular edema, unspecified eye: Secondary | ICD-10-CM

## 2017-09-01 DIAGNOSIS — M1712 Unilateral primary osteoarthritis, left knee: Secondary | ICD-10-CM | POA: Diagnosis not present

## 2017-09-01 DIAGNOSIS — E785 Hyperlipidemia, unspecified: Secondary | ICD-10-CM

## 2017-09-01 DIAGNOSIS — Z794 Long term (current) use of insulin: Secondary | ICD-10-CM | POA: Diagnosis not present

## 2017-09-01 DIAGNOSIS — Z7984 Long term (current) use of oral hypoglycemic drugs: Secondary | ICD-10-CM | POA: Diagnosis not present

## 2017-09-01 DIAGNOSIS — M4620 Osteomyelitis of vertebra, site unspecified: Secondary | ICD-10-CM | POA: Diagnosis not present

## 2017-09-01 DIAGNOSIS — Z86718 Personal history of other venous thrombosis and embolism: Secondary | ICD-10-CM | POA: Diagnosis not present

## 2017-09-01 MED ORDER — SIMVASTATIN 40 MG PO TABS: 40.0000 mg | ORAL_TABLET | Freq: Every day | ORAL | 3 refills | Status: DC

## 2017-09-01 MED ORDER — ACCU-CHEK FASTCLIX LANCETS MISC: 3 refills | Status: DC

## 2017-09-01 MED ORDER — DAPAGLIFLOZIN PROPANEDIOL 10 MG PO TABS: 10.0000 mg | ORAL_TABLET | Freq: Every day | ORAL | 3 refills | Status: DC

## 2017-09-01 NOTE — Telephone Encounter (Signed)
[facility] pharmacy sent rf rq for lancets, farxiga, and simvastatin.   LOV: 08/04/2017. Erx sent as requested.

## 2017-09-02 DIAGNOSIS — Z7984 Long term (current) use of oral hypoglycemic drugs: Secondary | ICD-10-CM | POA: Diagnosis not present

## 2017-09-02 DIAGNOSIS — M1712 Unilateral primary osteoarthritis, left knee: Secondary | ICD-10-CM | POA: Diagnosis not present

## 2017-09-02 DIAGNOSIS — Z794 Long term (current) use of insulin: Secondary | ICD-10-CM | POA: Diagnosis not present

## 2017-09-02 DIAGNOSIS — Z86718 Personal history of other venous thrombosis and embolism: Secondary | ICD-10-CM | POA: Diagnosis not present

## 2017-09-02 DIAGNOSIS — E119 Type 2 diabetes mellitus without complications: Secondary | ICD-10-CM | POA: Diagnosis not present

## 2017-09-02 DIAGNOSIS — M4620 Osteomyelitis of vertebra, site unspecified: Secondary | ICD-10-CM | POA: Diagnosis not present

## 2017-09-02 DIAGNOSIS — Z7901 Long term (current) use of anticoagulants: Secondary | ICD-10-CM | POA: Diagnosis not present

## 2017-09-03 DIAGNOSIS — M4620 Osteomyelitis of vertebra, site unspecified: Secondary | ICD-10-CM | POA: Diagnosis not present

## 2017-09-04 DIAGNOSIS — M4620 Osteomyelitis of vertebra, site unspecified: Secondary | ICD-10-CM | POA: Diagnosis not present

## 2017-09-05 ENCOUNTER — Other Ambulatory Visit: Payer: Self-pay | Admitting: Internal Medicine

## 2017-09-05 DIAGNOSIS — M4620 Osteomyelitis of vertebra, site unspecified: Secondary | ICD-10-CM | POA: Diagnosis not present

## 2017-09-05 DIAGNOSIS — N401 Enlarged prostate with lower urinary tract symptoms: Secondary | ICD-10-CM

## 2017-09-05 DIAGNOSIS — N138 Other obstructive and reflux uropathy: Secondary | ICD-10-CM | POA: Diagnosis not present

## 2017-09-05 DIAGNOSIS — R3916 Straining to void: Principal | ICD-10-CM

## 2017-09-05 DIAGNOSIS — Z125 Encounter for screening for malignant neoplasm of prostate: Secondary | ICD-10-CM | POA: Diagnosis not present

## 2017-09-05 DIAGNOSIS — N400 Enlarged prostate without lower urinary tract symptoms: Secondary | ICD-10-CM | POA: Diagnosis not present

## 2017-09-06 DIAGNOSIS — M4620 Osteomyelitis of vertebra, site unspecified: Secondary | ICD-10-CM | POA: Diagnosis not present

## 2017-09-06 DIAGNOSIS — Z7901 Long term (current) use of anticoagulants: Secondary | ICD-10-CM | POA: Diagnosis not present

## 2017-09-06 DIAGNOSIS — M1712 Unilateral primary osteoarthritis, left knee: Secondary | ICD-10-CM | POA: Diagnosis not present

## 2017-09-06 DIAGNOSIS — Z7984 Long term (current) use of oral hypoglycemic drugs: Secondary | ICD-10-CM | POA: Diagnosis not present

## 2017-09-06 DIAGNOSIS — Z86718 Personal history of other venous thrombosis and embolism: Secondary | ICD-10-CM | POA: Diagnosis not present

## 2017-09-06 DIAGNOSIS — Z794 Long term (current) use of insulin: Secondary | ICD-10-CM | POA: Diagnosis not present

## 2017-09-06 DIAGNOSIS — E119 Type 2 diabetes mellitus without complications: Secondary | ICD-10-CM | POA: Diagnosis not present

## 2017-09-06 NOTE — Telephone Encounter (Signed)
error 

## 2017-09-07 ENCOUNTER — Other Ambulatory Visit: Payer: Self-pay | Admitting: Internal Medicine

## 2017-09-07 DIAGNOSIS — E118 Type 2 diabetes mellitus with unspecified complications: Secondary | ICD-10-CM

## 2017-09-07 DIAGNOSIS — M4620 Osteomyelitis of vertebra, site unspecified: Secondary | ICD-10-CM | POA: Diagnosis not present

## 2017-09-07 DIAGNOSIS — Z794 Long term (current) use of insulin: Secondary | ICD-10-CM

## 2017-09-07 MED ORDER — GLUCOSE BLOOD VI STRP: ORAL_STRIP | 3 refills | Status: DC

## 2017-09-08 ENCOUNTER — Telehealth: Payer: Self-pay | Admitting: Internal Medicine

## 2017-09-08 DIAGNOSIS — Z7901 Long term (current) use of anticoagulants: Secondary | ICD-10-CM | POA: Diagnosis not present

## 2017-09-08 DIAGNOSIS — M1712 Unilateral primary osteoarthritis, left knee: Secondary | ICD-10-CM | POA: Diagnosis not present

## 2017-09-08 DIAGNOSIS — Z794 Long term (current) use of insulin: Secondary | ICD-10-CM | POA: Diagnosis not present

## 2017-09-08 DIAGNOSIS — Z7984 Long term (current) use of oral hypoglycemic drugs: Secondary | ICD-10-CM | POA: Diagnosis not present

## 2017-09-08 DIAGNOSIS — Z86718 Personal history of other venous thrombosis and embolism: Secondary | ICD-10-CM | POA: Diagnosis not present

## 2017-09-08 DIAGNOSIS — N401 Enlarged prostate with lower urinary tract symptoms: Secondary | ICD-10-CM

## 2017-09-08 DIAGNOSIS — E118 Type 2 diabetes mellitus with unspecified complications: Secondary | ICD-10-CM

## 2017-09-08 DIAGNOSIS — E119 Type 2 diabetes mellitus without complications: Secondary | ICD-10-CM | POA: Diagnosis not present

## 2017-09-08 DIAGNOSIS — R3916 Straining to void: Secondary | ICD-10-CM

## 2017-09-08 DIAGNOSIS — I824Y1 Acute embolism and thrombosis of unspecified deep veins of right proximal lower extremity: Secondary | ICD-10-CM

## 2017-09-08 DIAGNOSIS — M4620 Osteomyelitis of vertebra, site unspecified: Secondary | ICD-10-CM | POA: Diagnosis not present

## 2017-09-08 MED ORDER — INSULIN ASPART 100 UNIT/ML FLEXPEN: 10.0000 [IU] | PEN_INJECTOR | Freq: Three times a day (TID) | SUBCUTANEOUS | 1 refills | Status: DC

## 2017-09-08 MED ORDER — GLUCOSE BLOOD VI STRP: ORAL_STRIP | 1 refills | Status: DC

## 2017-09-08 MED ORDER — TAMSULOSIN HCL 0.4 MG PO CAPS: ORAL_CAPSULE | ORAL | 1 refills | Status: DC

## 2017-09-08 MED ORDER — GABAPENTIN 300 MG PO CAPS: ORAL_CAPSULE | ORAL | 1 refills | Status: DC

## 2017-09-08 MED ORDER — TRUEPLUS LANCETS 33G MISC: 1 refills | Status: DC

## 2017-09-08 MED ORDER — TRUETEST CONTROL LEVEL 1 VI LIQD: 0 refills | Status: DC

## 2017-09-08 MED ORDER — RIVAROXABAN 20 MG PO TABS: 20.0000 mg | ORAL_TABLET | Freq: Every day | ORAL | 1 refills | Status: DC

## 2017-09-08 MED ORDER — ACCU-CHEK FASTCLIX LANCETS MISC: 1 refills | Status: DC

## 2017-09-08 NOTE — Telephone Encounter (Signed)
Reviewed chart pt is up-to-date sent refills to [facility]../lmb  

## 2017-09-08 NOTE — Telephone Encounter (Signed)
Copied from [place]. Topic: Quick Communication - Rx Refill/Question >> Sep 08, 2017 11:11 AM Tye [place] wrote: Ethelle Lyon from [facility] called to put in requests for the pt's Rx, contact pharmacy if needed Gabapentin Novolog flexpen Flomax Xarelto Diabetic Supplies: Accu-chek fastclix Pt requests all [facility] test strips True plus 33 gauge lansix True draw lansen device True matrix level control solution Bd single use swab

## 2017-09-09 DIAGNOSIS — M4620 Osteomyelitis of vertebra, site unspecified: Secondary | ICD-10-CM | POA: Diagnosis not present

## 2017-09-10 DIAGNOSIS — M4620 Osteomyelitis of vertebra, site unspecified: Secondary | ICD-10-CM | POA: Diagnosis not present

## 2017-09-13 DIAGNOSIS — Z7901 Long term (current) use of anticoagulants: Secondary | ICD-10-CM | POA: Diagnosis not present

## 2017-09-13 DIAGNOSIS — M1712 Unilateral primary osteoarthritis, left knee: Secondary | ICD-10-CM | POA: Diagnosis not present

## 2017-09-13 DIAGNOSIS — Z86718 Personal history of other venous thrombosis and embolism: Secondary | ICD-10-CM | POA: Diagnosis not present

## 2017-09-13 DIAGNOSIS — Z7984 Long term (current) use of oral hypoglycemic drugs: Secondary | ICD-10-CM | POA: Diagnosis not present

## 2017-09-13 DIAGNOSIS — M4620 Osteomyelitis of vertebra, site unspecified: Secondary | ICD-10-CM | POA: Diagnosis not present

## 2017-09-13 DIAGNOSIS — Z794 Long term (current) use of insulin: Secondary | ICD-10-CM | POA: Diagnosis not present

## 2017-09-13 DIAGNOSIS — E119 Type 2 diabetes mellitus without complications: Secondary | ICD-10-CM | POA: Diagnosis not present

## 2017-09-14 ENCOUNTER — Telehealth: Payer: Self-pay

## 2017-09-14 DIAGNOSIS — I824Y1 Acute embolism and thrombosis of unspecified deep veins of right proximal lower extremity: Secondary | ICD-10-CM

## 2017-09-14 DIAGNOSIS — N401 Enlarged prostate with lower urinary tract symptoms: Secondary | ICD-10-CM

## 2017-09-14 DIAGNOSIS — Z794 Long term (current) use of insulin: Secondary | ICD-10-CM | POA: Diagnosis not present

## 2017-09-14 DIAGNOSIS — M4620 Osteomyelitis of vertebra, site unspecified: Secondary | ICD-10-CM | POA: Diagnosis not present

## 2017-09-14 DIAGNOSIS — E785 Hyperlipidemia, unspecified: Secondary | ICD-10-CM

## 2017-09-14 DIAGNOSIS — R3916 Straining to void: Principal | ICD-10-CM

## 2017-09-14 DIAGNOSIS — M1712 Unilateral primary osteoarthritis, left knee: Secondary | ICD-10-CM | POA: Diagnosis not present

## 2017-09-14 DIAGNOSIS — E118 Type 2 diabetes mellitus with unspecified complications: Secondary | ICD-10-CM

## 2017-09-14 DIAGNOSIS — E113319 Type 2 diabetes mellitus with moderate nonproliferative diabetic retinopathy with macular edema, unspecified eye: Secondary | ICD-10-CM

## 2017-09-14 DIAGNOSIS — Z7984 Long term (current) use of oral hypoglycemic drugs: Secondary | ICD-10-CM | POA: Diagnosis not present

## 2017-09-14 DIAGNOSIS — E119 Type 2 diabetes mellitus without complications: Secondary | ICD-10-CM | POA: Diagnosis not present

## 2017-09-14 MED ORDER — ALCOHOL SWABS PADS: MEDICATED_PAD | 3 refills | Status: DC

## 2017-09-14 MED ORDER — INSULIN ASPART 100 UNIT/ML FLEXPEN: 10.0000 [IU] | PEN_INJECTOR | Freq: Three times a day (TID) | SUBCUTANEOUS | 1 refills | Status: DC

## 2017-09-14 MED ORDER — DAPAGLIFLOZIN PROPANEDIOL 10 MG PO TABS: 10.0000 mg | ORAL_TABLET | Freq: Every day | ORAL | 3 refills | Status: DC

## 2017-09-14 MED ORDER — TRUEPLUS LANCETS 33G MISC: 1 refills | Status: DC

## 2017-09-14 MED ORDER — RIVAROXABAN 20 MG PO TABS: 20.0000 mg | ORAL_TABLET | Freq: Every day | ORAL | 1 refills | Status: DC

## 2017-09-14 MED ORDER — GABAPENTIN 300 MG PO CAPS: ORAL_CAPSULE | ORAL | 1 refills | Status: DC

## 2017-09-14 MED ORDER — TAMSULOSIN HCL 0.4 MG PO CAPS: ORAL_CAPSULE | ORAL | 1 refills | Status: DC

## 2017-09-14 MED ORDER — SIMVASTATIN 40 MG PO TABS: 40.0000 mg | ORAL_TABLET | Freq: Every day | ORAL | 3 refills | Status: DC

## 2017-09-14 MED ORDER — GLUCOSE BLOOD VI STRP: ORAL_STRIP | 1 refills | Status: DC

## 2017-09-14 NOTE — Telephone Encounter (Signed)
[facility] rq rf for simvastatin 40 mg, tamsulosin 0.4 mg, xarelto 20 mg, farxiga 10mg , gabapentin 300 mg, novolog, tru metrix test strip, truplus lancets and alcohol swab.   Erx sent for all

## 2017-09-15 DIAGNOSIS — M4620 Osteomyelitis of vertebra, site unspecified: Secondary | ICD-10-CM | POA: Diagnosis not present

## 2017-09-15 DIAGNOSIS — Z86718 Personal history of other venous thrombosis and embolism: Secondary | ICD-10-CM | POA: Diagnosis not present

## 2017-09-15 DIAGNOSIS — Z794 Long term (current) use of insulin: Secondary | ICD-10-CM | POA: Diagnosis not present

## 2017-09-15 DIAGNOSIS — Z7984 Long term (current) use of oral hypoglycemic drugs: Secondary | ICD-10-CM | POA: Diagnosis not present

## 2017-09-15 DIAGNOSIS — E119 Type 2 diabetes mellitus without complications: Secondary | ICD-10-CM | POA: Diagnosis not present

## 2017-09-15 DIAGNOSIS — M1712 Unilateral primary osteoarthritis, left knee: Secondary | ICD-10-CM | POA: Diagnosis not present

## 2017-09-15 DIAGNOSIS — Z7901 Long term (current) use of anticoagulants: Secondary | ICD-10-CM | POA: Diagnosis not present

## 2017-09-16 ENCOUNTER — Other Ambulatory Visit (INDEPENDENT_AMBULATORY_CARE_PROVIDER_SITE_OTHER): Payer: Medicare HMO

## 2017-09-16 DIAGNOSIS — Z794 Long term (current) use of insulin: Secondary | ICD-10-CM | POA: Diagnosis not present

## 2017-09-16 DIAGNOSIS — E1165 Type 2 diabetes mellitus with hyperglycemia: Secondary | ICD-10-CM | POA: Diagnosis not present

## 2017-09-16 DIAGNOSIS — M4620 Osteomyelitis of vertebra, site unspecified: Secondary | ICD-10-CM | POA: Diagnosis not present

## 2017-09-16 LAB — BASIC METABOLIC PANEL
BUN: 19 mg/dL (ref 6–23)
CALCIUM: 9.5 mg/dL (ref 8.4–10.5)
CHLORIDE: 105 meq/L (ref 96–112)
CO2: 26 mEq/L (ref 19–32)
Creatinine, Ser: 1.08 mg/dL (ref 0.40–1.50)
GFR: 86.54 mL/min (ref >60.00)
GLUCOSE: 149 mg/dL — AB (ref 70–99)
POTASSIUM: 3.9 meq/L (ref 3.5–5.1)
SODIUM: 139 meq/L (ref 135–145)

## 2017-09-16 LAB — HEMOGLOBIN A1C: Hgb A1c MFr Bld: 7.9 % — ABNORMAL HIGH (ref 4.6–6.5)

## 2017-09-17 DIAGNOSIS — M4620 Osteomyelitis of vertebra, site unspecified: Secondary | ICD-10-CM | POA: Diagnosis not present

## 2017-09-18 DIAGNOSIS — M4620 Osteomyelitis of vertebra, site unspecified: Secondary | ICD-10-CM | POA: Diagnosis not present

## 2017-09-19 DIAGNOSIS — M4620 Osteomyelitis of vertebra, site unspecified: Secondary | ICD-10-CM | POA: Diagnosis not present

## 2017-09-20 ENCOUNTER — Ambulatory Visit (INDEPENDENT_AMBULATORY_CARE_PROVIDER_SITE_OTHER): Payer: Medicare HMO | Admitting: Endocrinology

## 2017-09-20 ENCOUNTER — Encounter: Payer: Self-pay | Admitting: Endocrinology

## 2017-09-20 VITALS — BP 132/82 | HR 70 | Ht 71.0 in | Wt 197.0 lb

## 2017-09-20 DIAGNOSIS — E1165 Type 2 diabetes mellitus with hyperglycemia: Secondary | ICD-10-CM

## 2017-09-20 DIAGNOSIS — Z794 Long term (current) use of insulin: Secondary | ICD-10-CM

## 2017-09-20 DIAGNOSIS — M4620 Osteomyelitis of vertebra, site unspecified: Secondary | ICD-10-CM | POA: Diagnosis not present

## 2017-09-20 NOTE — Progress Notes (Signed)
Patient ID: Charles Parker, male   DOB: [birth year], 71 y.o.   MRN: [id number]          Reason for Appointment: Follow-up for Type 2 Diabetes  Referring physician: Scarlette Calico   History of Present Illness:          Date of diagnosis of type 2 diabetes mellitus: 2014        Background history:   He has been on various oral hypoglycemic drugs since the onset including metformin, Januvia and Amaryl Was also on Invokana in 2015 for about a year but not clear why this was stopped His level of control has been quite variable with highest A1c 13.6 in 06/2015  He has been on insulin since about 10/2014 as documented in his record  Recent history:   INSULIN regimen is:  Lantus 30  units bedtime with syringe, NovoLog 10 units before meals    Non-insulin hypoglycemic drugs the patient is taking TKP:[id number] 10 mg daily  His most recent A1c is 7.9, previously was 9.1 done in 06/2017  Current management, blood sugar patterns and problems identified:  Since his blood sugars were still relatively high as [place] was increased to 10 mg  He thinks he is taking this regularly  Although he states he is taking his NOVOLOG consistently with every meal his blood sugars are over 200 around 6 PM which is probably before dinner  Also fasting readings are consistently high now  He did not increase his NovoLog and Lantus as discussed on the last visit  He has started having protein at breakfast with eggs, has seen the dietitian previously  Lowest blood sugars are before lunch, no hypoglycemia and blood sugar was 149 in the lab around 11 AM  He is not checking blood sugars after supper very often which is his main meal  No side effects from [place]        Side effects from medications have been: Hives from metformin  Compliance with the medical regimen: Good  Glucose monitoring:  done 2-3 times a   day usually        Glucometer: Accu-Chek guide      Blood Glucose readings by   Mean values  apply above for all meters except median for One Touch  PRE-MEAL Fasting Lunch Dinner Bedtime Overall  Glucose range: 147-226  135-170   20 1-235     Mean/median: 168 150  218   173    POST-MEAL PC Breakfast PC Lunch PC Dinner  Glucose range:   152, 178   Mean/median:       Self-care: The diet that the patient has been following is: tries to limit [employer] .      Typical meal intake: Breakfast is eggs/cereal or oatmeal usually, lunch: Cold cut sandwiches.  Dinner chicken and vegetables.  Snacks will be chips, peanut butter crackers Supper at 6 pm                Dietician visit, most recent:10/18               Exercise: some walking was not able to do much because of leg weakness   Weight history:  Wt Readings from Last 3 Encounters:  09/20/17 197 lb (89.4 kg)  08/09/17 195 lb 3.2 oz (88.5 kg)  08/04/17 196 lb 4 oz (89 kg)    Glycemic control:   Lab Results  Component Value Date   HGBA1C 7.9 (H) 09/16/2017   HGBA1C 9.1 06/13/2017  HGBA1C 7.5 (H) 02/08/2017   Lab Results  Component Value Date   MICROALBUR 1.0 02/08/2017   LDLCALC 87 12/15/2016   CREATININE 1.08 09/16/2017   Lab Results  Component Value Date   MICRALBCREAT 0.5 02/08/2017    Lab Results  Component Value Date   FRUCTOSAMINE 334 (H) 08/05/2017   FRUCTOSAMINE 382 (H) 06/28/2017      Allergies as of 09/20/2017      Reactions   Metformin And Related Diarrhea   Codeine Rash   All over the body      Medication List        Accurate as of 09/20/17  1:15 PM. Always use your most recent med list.          Alcohol Swabs Pads Use to clean area before insulin injections. DX E11.8   dapagliflozin propanediol 10 MG Tabs tablet Commonly known as:  FARXIGA Take 10 mg by mouth daily.   ferrous sulfate 325 (65 FE) MG tablet Take 1 tablet (325 mg total) by mouth 2 (two) times daily with a meal.   gabapentin 300 MG capsule Commonly known as:  NEURONTIN TAKE 1 CAPSULE(300 MG) BY MOUTH TWICE  DAILY   glucose blood test strip Commonly known as:  TRUE METRIX BLOOD GLUCOSE TEST USE TO CHECK BLOOD SUGARS THREE TIMES DAILY   insulin aspart 100 UNIT/ML FlexPen Commonly known as:  NOVOLOG FLEXPEN Inject 10 Units into the skin 3 (three) times daily with meals.   insulin glargine 100 UNIT/ML injection Commonly known as:  LANTUS Inject 0.3 mLs (30 Units total) into the skin at bedtime.   Insulin Pen Needle 31G X 5 MM Misc Use with both insulin pens   oxybutynin 5 MG tablet Commonly known as:  DITROPAN TAKE 1 TABLET(5 MG) BY MOUTH DAILY   rivaroxaban 20 MG Tabs tablet Commonly known as:  XARELTO Take 1 tablet (20 mg total) by mouth daily with supper.   simvastatin 40 MG tablet Commonly known as:  ZOCOR Take 1 tablet (40 mg total) by mouth at bedtime.   tamsulosin 0.4 MG Caps capsule Commonly known as:  FLOMAX TAKE 1 CAPSULE(0.4 MG) BY MOUTH DAILY AFTER BREAKFAST   TRUEPLUS LANCETS 33G Misc USE TO CHECK BLOOD SUGARS THREE TIMES DAY   Vitamin D (Ergocalciferol) 50000 units Caps capsule Commonly known as:  DRISDOL Take 1 capsule (50,000 Units total) by mouth every 7 (seven) days.       Allergies:  Allergies  Allergen Reactions  . Metformin And Related Diarrhea  . Codeine Rash    All over the body    Past Medical History:  Diagnosis Date  . Clotting disorder (HCC)    DVT  . Diabetes mellitus    type II  . Diverticulosis   . DVT (deep venous thrombosis) ([place])   . Glaucoma    no eye drops   . Hepatic cyst   . Hypercholesterolemia   . Hypertension   . Myocardial infarction ([place])    very mild long ago   . Renal cyst   . Sleep apnea   . Tubulovillous adenoma     Past Surgical History:  Procedure Laterality Date  . BLADDER NECK RECONSTRUCTION  02/03/2012   Procedure: BLADDER NECK REPAIR;  Surgeon: Adin Hector, MD;  Location: [facility];  Service: General;;  . COLONOSCOPY  02/2013   polyp removal(mult.)  . KNEE SURGERY  2004   left  . POLYPECTOMY      . PROCTOSCOPY  02/03/2012  Procedure: PROCTOSCOPY;  Surgeon: Adin Hector, MD;  Location: [facility];  Service: General;;  . STOMACH SURGERY  02/2012  . TEE WITHOUT CARDIOVERSION N/A 10/05/2016   Procedure: TRANSESOPHAGEAL ECHOCARDIOGRAM (TEE);  Surgeon: Sanda Klein, MD;  Location: [facility] ENDOSCOPY;  Service: Cardiovascular;  Laterality: N/A;    Family History  Problem Relation Age of Onset  . Hypertension Mother   . Diabetes Mother   . Cancer Mother        ? location  . Colon cancer Father 33  . Colon polyps Neg Hx     Social History:  reports that  has never smoked. he has never used smokeless tobacco. He reports that he does not drink alcohol or use drugs.   Review of Systems   Lipid history: Has been taking Zocor 40 mg with good control    Lab Results  Component Value Date   CHOL 161 12/15/2016   HDL 52.10 12/15/2016   LDLCALC 87 12/15/2016   LDLDIRECT 118.0 11/11/2015   TRIG 108.0 12/15/2016   CHOLHDL 3 12/15/2016           Hypertension:Not present, previously on 5 mg lisinopril until 1/18  Most recent eye exam was In 03/2017  Most recent foot exam: 9/18  RENAL function is stable with continuing Farxiga  Lab Results  Component Value Date   CREATININE 1.08 09/16/2017   BUN 19 09/16/2017   NA 139 09/16/2017   K 3.9 09/16/2017   CL 105 09/16/2017   CO2 26 09/16/2017      LABS:  Lab on 09/16/2017  Component Date Value Ref Range Status  . Sodium 09/16/2017 139  135 - 145 mEq/L Final  . Potassium 09/16/2017 3.9  3.5 - 5.1 mEq/L Final  . Chloride 09/16/2017 105  96 - 112 mEq/L Final  . CO2 09/16/2017 26  19 - 32 mEq/L Final  . Glucose, Bld 09/16/2017 149* 70 - 99 mg/dL Final  . BUN 09/16/2017 19  6 - 23 mg/dL Final  . Creatinine, Ser 09/16/2017 1.08  0.40 - 1.50 mg/dL Final  . Calcium 09/16/2017 9.5  8.4 - 10.5 mg/dL Final  . GFR 09/16/2017 86.54  >60.00 mL/min Final  . Hgb A1c MFr Bld 09/16/2017 7.9* 4.6 - 6.5 % Final   Glycemic Control Guidelines for  People with Diabetes:Non Diabetic:  <6%Goal of Therapy: <7%Additional Action Suggested:  >8%     Physical Examination:  BP 132/82   Pulse 70   Ht 5\' 11"  (1.803 m)   Wt 197 lb (89.4 kg)   SpO2 92%   BMI 27.48 kg/m          ASSESSMENT:  Diabetes type 2, uncontrolled    See history of present illness for detailed discussion of current diabetes management, blood sugar patterns and problems identified   His  most recent A1c has been 7.9 which is improved  However blood sugars are still mostly high throughout the day; previously were not able to see his blood sugar patterns because of lack of adequate glucose monitoring or record-keeping Although his diet appears to be fairly good and weight is stable he looks like he needs increased insulin despite using a higher dose of [place]   PLAN:     Start  checking blood sugars after supper more consistently and also couple of hours after lunch instead of around 6 PM  Continue 10 mg Farxiga  34 units of Lantus, discussed that if morning sugars are below 90 he may need to cut  back to 32  Use at least 14 units to cover  lunch for usual meals  If blood sugars are consistently high after evening meal and may need 12 units at suppertime of Novolog  Also if blood sugars are starting get below 90 at lunchtime he can reduce morning dose to 8 units  He can try chair exercises and more stretching and toning  Patient Instructions    Lantus 34  units bedtime with syringe, NovoLog 10 units before BREAKFAST AND  DINNER AND 14 AT LUNCH  IF sugar under 90 at lunch then go to 8 for am Novolog      Elayne Snare 09/20/2017, 1:15 PM   Note: This office note was prepared with Dragon voice recognition system technology. Any transcriptional errors that result from this process are unintentional.

## 2017-09-20 NOTE — Patient Instructions (Addendum)
Lantus 34  units bedtime with syringe, NovoLog 10 units before BRE 1 [id number] AND  DINNER AND 14 AT LUNCH  IF sugar under 90 at lunch then go to 8 for am Novolog

## 2017-09-21 DIAGNOSIS — M4620 Osteomyelitis of vertebra, site unspecified: Secondary | ICD-10-CM | POA: Diagnosis not present

## 2017-09-22 DIAGNOSIS — Z86718 Personal history of other venous thrombosis and embolism: Secondary | ICD-10-CM | POA: Diagnosis not present

## 2017-09-22 DIAGNOSIS — Z794 Long term (current) use of insulin: Secondary | ICD-10-CM | POA: Diagnosis not present

## 2017-09-22 DIAGNOSIS — Z7901 Long term (current) use of anticoagulants: Secondary | ICD-10-CM | POA: Diagnosis not present

## 2017-09-22 DIAGNOSIS — M4620 Osteomyelitis of vertebra, site unspecified: Secondary | ICD-10-CM | POA: Diagnosis not present

## 2017-09-22 DIAGNOSIS — M1712 Unilateral primary osteoarthritis, left knee: Secondary | ICD-10-CM | POA: Diagnosis not present

## 2017-09-22 DIAGNOSIS — E119 Type 2 diabetes mellitus without complications: Secondary | ICD-10-CM | POA: Diagnosis not present

## 2017-09-22 DIAGNOSIS — Z7984 Long term (current) use of oral hypoglycemic drugs: Secondary | ICD-10-CM | POA: Diagnosis not present

## 2017-09-23 DIAGNOSIS — M4324 Fusion of spine, thoracic region: Secondary | ICD-10-CM | POA: Diagnosis not present

## 2017-09-23 DIAGNOSIS — E139 Other specified diabetes mellitus without complications: Secondary | ICD-10-CM | POA: Diagnosis not present

## 2017-09-23 DIAGNOSIS — M4620 Osteomyelitis of vertebra, site unspecified: Secondary | ICD-10-CM | POA: Diagnosis not present

## 2017-09-23 DIAGNOSIS — Z981 Arthrodesis status: Secondary | ICD-10-CM | POA: Diagnosis not present

## 2017-09-23 DIAGNOSIS — G062 Extradural and subdural abscess, unspecified: Secondary | ICD-10-CM | POA: Diagnosis not present

## 2017-09-23 DIAGNOSIS — M4624 Osteomyelitis of vertebra, thoracic region: Secondary | ICD-10-CM | POA: Diagnosis not present

## 2017-09-23 DIAGNOSIS — R2689 Other abnormalities of gait and mobility: Secondary | ICD-10-CM | POA: Diagnosis not present

## 2017-09-24 DIAGNOSIS — M4620 Osteomyelitis of vertebra, site unspecified: Secondary | ICD-10-CM | POA: Diagnosis not present

## 2017-09-26 DIAGNOSIS — M4620 Osteomyelitis of vertebra, site unspecified: Secondary | ICD-10-CM | POA: Diagnosis not present

## 2017-09-28 DIAGNOSIS — M4620 Osteomyelitis of vertebra, site unspecified: Secondary | ICD-10-CM | POA: Diagnosis not present

## 2017-09-29 DIAGNOSIS — Z7984 Long term (current) use of oral hypoglycemic drugs: Secondary | ICD-10-CM | POA: Diagnosis not present

## 2017-09-29 DIAGNOSIS — Z7901 Long term (current) use of anticoagulants: Secondary | ICD-10-CM | POA: Diagnosis not present

## 2017-09-29 DIAGNOSIS — Z86718 Personal history of other venous thrombosis and embolism: Secondary | ICD-10-CM | POA: Diagnosis not present

## 2017-09-29 DIAGNOSIS — M4620 Osteomyelitis of vertebra, site unspecified: Secondary | ICD-10-CM | POA: Diagnosis not present

## 2017-09-29 DIAGNOSIS — M1712 Unilateral primary osteoarthritis, left knee: Secondary | ICD-10-CM | POA: Diagnosis not present

## 2017-09-29 DIAGNOSIS — E119 Type 2 diabetes mellitus without complications: Secondary | ICD-10-CM | POA: Diagnosis not present

## 2017-09-29 DIAGNOSIS — Z794 Long term (current) use of insulin: Secondary | ICD-10-CM | POA: Diagnosis not present

## 2017-09-30 DIAGNOSIS — M4620 Osteomyelitis of vertebra, site unspecified: Secondary | ICD-10-CM | POA: Diagnosis not present

## 2017-10-01 DIAGNOSIS — M4620 Osteomyelitis of vertebra, site unspecified: Secondary | ICD-10-CM | POA: Diagnosis not present

## 2017-10-03 DIAGNOSIS — Z794 Long term (current) use of insulin: Secondary | ICD-10-CM | POA: Diagnosis not present

## 2017-10-03 DIAGNOSIS — E119 Type 2 diabetes mellitus without complications: Secondary | ICD-10-CM | POA: Diagnosis not present

## 2017-10-03 DIAGNOSIS — M4620 Osteomyelitis of vertebra, site unspecified: Secondary | ICD-10-CM | POA: Diagnosis not present

## 2017-10-03 DIAGNOSIS — M1712 Unilateral primary osteoarthritis, left knee: Secondary | ICD-10-CM | POA: Diagnosis not present

## 2017-10-03 DIAGNOSIS — Z86718 Personal history of other venous thrombosis and embolism: Secondary | ICD-10-CM | POA: Diagnosis not present

## 2017-10-03 DIAGNOSIS — Z7901 Long term (current) use of anticoagulants: Secondary | ICD-10-CM | POA: Diagnosis not present

## 2017-10-03 DIAGNOSIS — Z7984 Long term (current) use of oral hypoglycemic drugs: Secondary | ICD-10-CM | POA: Diagnosis not present

## 2017-10-05 DIAGNOSIS — M4620 Osteomyelitis of vertebra, site unspecified: Secondary | ICD-10-CM | POA: Diagnosis not present

## 2017-10-06 DIAGNOSIS — E119 Type 2 diabetes mellitus without complications: Secondary | ICD-10-CM | POA: Diagnosis not present

## 2017-10-06 DIAGNOSIS — Z7984 Long term (current) use of oral hypoglycemic drugs: Secondary | ICD-10-CM | POA: Diagnosis not present

## 2017-10-06 DIAGNOSIS — M4620 Osteomyelitis of vertebra, site unspecified: Secondary | ICD-10-CM | POA: Diagnosis not present

## 2017-10-06 DIAGNOSIS — Z86718 Personal history of other venous thrombosis and embolism: Secondary | ICD-10-CM | POA: Diagnosis not present

## 2017-10-06 DIAGNOSIS — Z7901 Long term (current) use of anticoagulants: Secondary | ICD-10-CM | POA: Diagnosis not present

## 2017-10-06 DIAGNOSIS — Z794 Long term (current) use of insulin: Secondary | ICD-10-CM | POA: Diagnosis not present

## 2017-10-06 DIAGNOSIS — M1712 Unilateral primary osteoarthritis, left knee: Secondary | ICD-10-CM | POA: Diagnosis not present

## 2017-10-07 DIAGNOSIS — M4620 Osteomyelitis of vertebra, site unspecified: Secondary | ICD-10-CM | POA: Diagnosis not present

## 2017-10-08 DIAGNOSIS — M4620 Osteomyelitis of vertebra, site unspecified: Secondary | ICD-10-CM | POA: Diagnosis not present

## 2017-10-09 DIAGNOSIS — M4620 Osteomyelitis of vertebra, site unspecified: Secondary | ICD-10-CM | POA: Diagnosis not present

## 2017-10-10 DIAGNOSIS — M4620 Osteomyelitis of vertebra, site unspecified: Secondary | ICD-10-CM | POA: Diagnosis not present

## 2017-10-11 DIAGNOSIS — M4624 Osteomyelitis of vertebra, thoracic region: Secondary | ICD-10-CM | POA: Diagnosis not present

## 2017-10-11 DIAGNOSIS — M4620 Osteomyelitis of vertebra, site unspecified: Secondary | ICD-10-CM | POA: Diagnosis not present

## 2017-10-12 ENCOUNTER — Encounter: Payer: Self-pay | Admitting: Podiatry

## 2017-10-12 ENCOUNTER — Ambulatory Visit (INDEPENDENT_AMBULATORY_CARE_PROVIDER_SITE_OTHER): Payer: Medicare HMO | Admitting: Podiatry

## 2017-10-12 DIAGNOSIS — M79674 Pain in right toe(s): Secondary | ICD-10-CM

## 2017-10-12 DIAGNOSIS — M205X9 Other deformities of toe(s) (acquired), unspecified foot: Secondary | ICD-10-CM

## 2017-10-12 DIAGNOSIS — E119 Type 2 diabetes mellitus without complications: Secondary | ICD-10-CM | POA: Diagnosis not present

## 2017-10-12 DIAGNOSIS — M4620 Osteomyelitis of vertebra, site unspecified: Secondary | ICD-10-CM | POA: Diagnosis not present

## 2017-10-12 DIAGNOSIS — B351 Tinea unguium: Secondary | ICD-10-CM | POA: Diagnosis not present

## 2017-10-12 DIAGNOSIS — M79675 Pain in left toe(s): Secondary | ICD-10-CM

## 2017-10-12 NOTE — Progress Notes (Signed)
Complaint:  Visit Type: Patient returns to my office for continued preventative foot care services. Complaint: Patient states" my nails have grown long and thick and become painful to walk and wear shoes" Patient has been diagnosed with DM with no foot complications. The patient presents for preventative foot care services. No changes to ROS  Podiatric Exam: Vascular: dorsalis pedis and posterior tibial pulses are palpable bilateral. Capillary return is immediate. Temperature gradient is WNL. Skin turgor WNL  Sensorium: Normal Semmes Weinstein monofilament test. Normal tactile sensation bilaterally. Nail Exam: Pt has thick disfigured discolored nails with subungual debris noted bilateral entire nail hallux through fifth toenails Ulcer Exam: There is no evidence of ulcer or pre-ulcerative changes or infection. Orthopedic Exam: Muscle tone and strength are WNL. No limitations in general ROM. No crepitus or effusions noted. Foot type and digits show no abnormalities. DJD 1st MPJ with overlapping toe B/L. Skin: No Porokeratosis. No infection or ulcers  Diagnosis:  Onychomycosis, , Pain in right toe, pain in left toes  Treatment & Plan Procedures and Treatment: Consent by patient was obtained for treatment procedures.   Debridement of mycotic and hypertrophic toenails, 1 through 5 bilateral and clearing of subungual debris. No ulceration, no infection noted.  Return Visit-Office Procedure: Patient instructed to return to the office for a follow up visit 3 months for continued evaluation and treatment.    Danise Dehne DPM 

## 2017-10-13 DIAGNOSIS — Z7901 Long term (current) use of anticoagulants: Secondary | ICD-10-CM | POA: Diagnosis not present

## 2017-10-13 DIAGNOSIS — E119 Type 2 diabetes mellitus without complications: Secondary | ICD-10-CM | POA: Diagnosis not present

## 2017-10-13 DIAGNOSIS — M1712 Unilateral primary osteoarthritis, left knee: Secondary | ICD-10-CM | POA: Diagnosis not present

## 2017-10-13 DIAGNOSIS — Z7984 Long term (current) use of oral hypoglycemic drugs: Secondary | ICD-10-CM | POA: Diagnosis not present

## 2017-10-13 DIAGNOSIS — M4620 Osteomyelitis of vertebra, site unspecified: Secondary | ICD-10-CM | POA: Diagnosis not present

## 2017-10-13 DIAGNOSIS — Z794 Long term (current) use of insulin: Secondary | ICD-10-CM | POA: Diagnosis not present

## 2017-10-13 DIAGNOSIS — Z86718 Personal history of other venous thrombosis and embolism: Secondary | ICD-10-CM | POA: Diagnosis not present

## 2017-10-14 DIAGNOSIS — M4620 Osteomyelitis of vertebra, site unspecified: Secondary | ICD-10-CM | POA: Diagnosis not present

## 2017-10-15 DIAGNOSIS — M4620 Osteomyelitis of vertebra, site unspecified: Secondary | ICD-10-CM | POA: Diagnosis not present

## 2017-10-16 DIAGNOSIS — M4620 Osteomyelitis of vertebra, site unspecified: Secondary | ICD-10-CM | POA: Diagnosis not present

## 2017-10-17 ENCOUNTER — Other Ambulatory Visit: Payer: Self-pay | Admitting: Internal Medicine

## 2017-10-17 DIAGNOSIS — Z794 Long term (current) use of insulin: Secondary | ICD-10-CM

## 2017-10-17 DIAGNOSIS — M4620 Osteomyelitis of vertebra, site unspecified: Secondary | ICD-10-CM | POA: Diagnosis not present

## 2017-10-17 DIAGNOSIS — E118 Type 2 diabetes mellitus with unspecified complications: Secondary | ICD-10-CM

## 2017-10-18 DIAGNOSIS — M4620 Osteomyelitis of vertebra, site unspecified: Secondary | ICD-10-CM | POA: Diagnosis not present

## 2017-10-19 DIAGNOSIS — M4620 Osteomyelitis of vertebra, site unspecified: Secondary | ICD-10-CM | POA: Diagnosis not present

## 2017-10-20 DIAGNOSIS — M4620 Osteomyelitis of vertebra, site unspecified: Secondary | ICD-10-CM | POA: Diagnosis not present

## 2017-10-21 DIAGNOSIS — M4620 Osteomyelitis of vertebra, site unspecified: Secondary | ICD-10-CM | POA: Diagnosis not present

## 2017-10-22 DIAGNOSIS — M4620 Osteomyelitis of vertebra, site unspecified: Secondary | ICD-10-CM | POA: Diagnosis not present

## 2017-10-24 DIAGNOSIS — M4324 Fusion of spine, thoracic region: Secondary | ICD-10-CM | POA: Diagnosis not present

## 2017-10-24 DIAGNOSIS — G062 Extradural and subdural abscess, unspecified: Secondary | ICD-10-CM | POA: Diagnosis not present

## 2017-10-24 DIAGNOSIS — Z981 Arthrodesis status: Secondary | ICD-10-CM | POA: Diagnosis not present

## 2017-10-24 DIAGNOSIS — R2689 Other abnormalities of gait and mobility: Secondary | ICD-10-CM | POA: Diagnosis not present

## 2017-10-24 DIAGNOSIS — M4624 Osteomyelitis of vertebra, thoracic region: Secondary | ICD-10-CM | POA: Diagnosis not present

## 2017-10-24 DIAGNOSIS — E139 Other specified diabetes mellitus without complications: Secondary | ICD-10-CM | POA: Diagnosis not present

## 2017-10-25 DIAGNOSIS — M4620 Osteomyelitis of vertebra, site unspecified: Secondary | ICD-10-CM | POA: Diagnosis not present

## 2017-10-26 DIAGNOSIS — M4620 Osteomyelitis of vertebra, site unspecified: Secondary | ICD-10-CM | POA: Diagnosis not present

## 2017-10-27 DIAGNOSIS — M4620 Osteomyelitis of vertebra, site unspecified: Secondary | ICD-10-CM | POA: Diagnosis not present

## 2017-10-28 DIAGNOSIS — M4620 Osteomyelitis of vertebra, site unspecified: Secondary | ICD-10-CM | POA: Diagnosis not present

## 2017-10-29 DIAGNOSIS — M4620 Osteomyelitis of vertebra, site unspecified: Secondary | ICD-10-CM | POA: Diagnosis not present

## 2017-10-30 DIAGNOSIS — M4620 Osteomyelitis of vertebra, site unspecified: Secondary | ICD-10-CM | POA: Diagnosis not present

## 2017-10-31 DIAGNOSIS — M4620 Osteomyelitis of vertebra, site unspecified: Secondary | ICD-10-CM | POA: Diagnosis not present

## 2017-11-01 DIAGNOSIS — M4620 Osteomyelitis of vertebra, site unspecified: Secondary | ICD-10-CM | POA: Diagnosis not present

## 2017-11-02 DIAGNOSIS — M4620 Osteomyelitis of vertebra, site unspecified: Secondary | ICD-10-CM | POA: Diagnosis not present

## 2017-11-03 DIAGNOSIS — M4620 Osteomyelitis of vertebra, site unspecified: Secondary | ICD-10-CM | POA: Diagnosis not present

## 2017-11-04 DIAGNOSIS — M4620 Osteomyelitis of vertebra, site unspecified: Secondary | ICD-10-CM | POA: Diagnosis not present

## 2017-11-05 DIAGNOSIS — M4620 Osteomyelitis of vertebra, site unspecified: Secondary | ICD-10-CM | POA: Diagnosis not present

## 2017-11-06 DIAGNOSIS — M4620 Osteomyelitis of vertebra, site unspecified: Secondary | ICD-10-CM | POA: Diagnosis not present

## 2017-11-07 DIAGNOSIS — M4620 Osteomyelitis of vertebra, site unspecified: Secondary | ICD-10-CM | POA: Diagnosis not present

## 2017-11-08 DIAGNOSIS — M4620 Osteomyelitis of vertebra, site unspecified: Secondary | ICD-10-CM | POA: Diagnosis not present

## 2017-11-09 DIAGNOSIS — M4620 Osteomyelitis of vertebra, site unspecified: Secondary | ICD-10-CM | POA: Diagnosis not present

## 2017-11-10 DIAGNOSIS — M4620 Osteomyelitis of vertebra, site unspecified: Secondary | ICD-10-CM | POA: Diagnosis not present

## 2017-11-11 DIAGNOSIS — M4620 Osteomyelitis of vertebra, site unspecified: Secondary | ICD-10-CM | POA: Diagnosis not present

## 2017-11-12 DIAGNOSIS — M4620 Osteomyelitis of vertebra, site unspecified: Secondary | ICD-10-CM | POA: Diagnosis not present

## 2017-11-13 DIAGNOSIS — M4620 Osteomyelitis of vertebra, site unspecified: Secondary | ICD-10-CM | POA: Diagnosis not present

## 2017-11-14 DIAGNOSIS — M4620 Osteomyelitis of vertebra, site unspecified: Secondary | ICD-10-CM | POA: Diagnosis not present

## 2017-11-15 DIAGNOSIS — M4620 Osteomyelitis of vertebra, site unspecified: Secondary | ICD-10-CM | POA: Diagnosis not present

## 2017-11-16 DIAGNOSIS — M4620 Osteomyelitis of vertebra, site unspecified: Secondary | ICD-10-CM | POA: Diagnosis not present

## 2017-11-17 DIAGNOSIS — M4620 Osteomyelitis of vertebra, site unspecified: Secondary | ICD-10-CM | POA: Diagnosis not present

## 2017-11-18 ENCOUNTER — Other Ambulatory Visit (INDEPENDENT_AMBULATORY_CARE_PROVIDER_SITE_OTHER): Payer: Medicare HMO

## 2017-11-18 DIAGNOSIS — E1165 Type 2 diabetes mellitus with hyperglycemia: Secondary | ICD-10-CM | POA: Diagnosis not present

## 2017-11-18 DIAGNOSIS — Z794 Long term (current) use of insulin: Secondary | ICD-10-CM | POA: Diagnosis not present

## 2017-11-18 DIAGNOSIS — M4620 Osteomyelitis of vertebra, site unspecified: Secondary | ICD-10-CM | POA: Diagnosis not present

## 2017-11-18 LAB — COMPREHENSIVE METABOLIC PANEL
ALBUMIN: 4.4 g/dL (ref 3.5–5.2)
ALK PHOS: 61 U/L (ref 39–117)
ALT: 24 U/L (ref 0–53)
AST: 18 U/L (ref 0–37)
BUN: 14 mg/dL (ref 6–23)
CALCIUM: 8.9 mg/dL (ref 8.4–10.5)
CHLORIDE: 102 meq/L (ref 96–112)
CO2: 27 mEq/L (ref 19–32)
Creatinine, Ser: 1.18 mg/dL (ref 0.40–1.50)
GFR: 78.1 mL/min (ref >60.00)
Glucose, Bld: 250 mg/dL — ABNORMAL HIGH (ref 70–99)
POTASSIUM: 3.6 meq/L (ref 3.5–5.1)
Sodium: 138 mEq/L (ref 135–145)
TOTAL PROTEIN: 7.2 g/dL (ref 6.0–8.3)
Total Bilirubin: 0.7 mg/dL (ref 0.2–1.2)

## 2017-11-18 LAB — HEMOGLOBIN A1C: HEMOGLOBIN A1C: 8.7 % — AB (ref 4.6–6.5)

## 2017-11-19 DIAGNOSIS — M4620 Osteomyelitis of vertebra, site unspecified: Secondary | ICD-10-CM | POA: Diagnosis not present

## 2017-11-21 ENCOUNTER — Ambulatory Visit (INDEPENDENT_AMBULATORY_CARE_PROVIDER_SITE_OTHER): Payer: Medicare HMO | Admitting: Endocrinology

## 2017-11-21 ENCOUNTER — Encounter: Payer: Self-pay | Admitting: Endocrinology

## 2017-11-21 VITALS — BP 134/90 | HR 72 | Temp 97.9°F | Resp 16 | Ht 70.0 in | Wt 195.0 lb

## 2017-11-21 DIAGNOSIS — M4620 Osteomyelitis of vertebra, site unspecified: Secondary | ICD-10-CM | POA: Diagnosis not present

## 2017-11-21 DIAGNOSIS — E118 Type 2 diabetes mellitus with unspecified complications: Secondary | ICD-10-CM

## 2017-11-21 DIAGNOSIS — Z794 Long term (current) use of insulin: Secondary | ICD-10-CM

## 2017-11-21 LAB — GLUCOSE, POCT (MANUAL RESULT ENTRY): POC Glucose: 145 mg/dl — AB (ref 70–99)

## 2017-11-21 MED ORDER — INSULIN DEGLUDEC 200 UNIT/ML ~~LOC~~ SOPN: 38.0000 [IU] | PEN_INJECTOR | Freq: Every day | SUBCUTANEOUS | 1 refills | Status: DC

## 2017-11-21 NOTE — Progress Notes (Signed)
Patient ID: Charles Parker, male   DOB: September 20, 1946, 72 y.o.   MRN: [id number]          Reason for Appointment: Follow-up for Type 2 Diabetes  Referring physician: Scarlette Calico   History of Present Illness:          Date of diagnosis of type 2 diabetes mellitus: 2014        Background history:   He has been on various oral hypoglycemic drugs since the onset including metformin, Januvia and Amaryl Was also on Invokana in 2015 for about a year but not clear why this was stopped His level of control has been quite variable with highest A1c 13.6 in 06/2015  He has been on insulin since about 10/2014 as documented in his record  Recent history:   INSULIN regimen is:  Lantus 35  units bedtime with syringe, NovoLog 12 units before meals    Non-insulin hypoglycemic drugs the patient is taking [id number] 10 mg daily  His most recent A1c is higher at 8.7, had improved to 7.9 previously  Current management, blood sugar patterns and problems identified:  Not clear why his blood sugars are higher than before even though his insulin has been increased  He thinks he is consistent with his [place] Glade also  Did not bring his monitor today and difficult to get a blood sugar patterns  Also was recommended that he check his sugars about 2 hours after eating rather than right before meals  His Lantus was increased by 5 units on his last visit but still appears to have relatively high fasting readings  He thinks his highest readings are after dinner or at night  Today his blood sugar is 145 before lunch but after breakfast in the lab it was 250  He will frequently Cheerios for breakfast but otherwise believes sausage biscuits  He and his family have questions about his meal planning and what to eat and how much to eat  He thinks that he will still get some sweets in his diet  However his weight is still relatively stable  No hypoglycemia        Side effects from medications have been:  Hives from metformin  Compliance with the medical regimen: Good  Glucose monitoring:  done 2-3 times a   day usually        Glucometer: Accu-Chek guide      Blood Glucose readings by recall:  Mean values apply above for all meters except median for One Touch  PRE-MEAL Fasting Lunch Dinner Bedtime Overall  Glucose range:  120-170  160  175 170-200+   Mean/median:        Self-care: The diet that the patient has been following is: tries to limit [employer] .      Typical meal intake: Breakfast is eggs/cereal or oatmeal usually, lunch: Cold cut sandwiches.  Dinner chicken and vegetables.  Snacks will be chips, peanut butter crackers Supper at 6 pm                Dietician visit, most recent:10/18               Exercise: some walking around his apartment area and is not able to do much because of leg weakness   Weight history:  Wt Readings from Last 3 Encounters:  11/21/17 195 lb (88.5 kg)  09/20/17 197 lb (89.4 kg)  08/09/17 195 lb 3.2 oz (88.5 kg)    Glycemic control:   Lab Results  Component Value Date   HGBA1C 8.7 (H) 11/18/2017   HGBA1C 7.9 (H) 09/16/2017   HGBA1C 9.1 06/13/2017   Lab Results  Component Value Date   MICROALBUR 1.0 02/08/2017   LDLCALC 87 12/15/2016   CREATININE 1.18 11/18/2017   Lab Results  Component Value Date   MICRALBCREAT 0.5 02/08/2017    Lab Results  Component Value Date   FRUCTOSAMINE 334 (H) 08/05/2017   FRUCTOSAMINE 382 (H) 06/28/2017      Allergies as of 11/21/2017      Reactions   Metformin And Related Diarrhea   Codeine Rash   All over the body      Medication List        Accurate as of 11/21/17  1:37 PM. Always use your most recent med list.          Alcohol Swabs Pads Use to clean area before insulin injections. DX E11.8   dapagliflozin propanediol 10 MG Tabs tablet Commonly known as:  FARXIGA Take 10 mg by mouth daily.   ferrous sulfate 325 (65 FE) MG tablet Take 1 tablet (325 mg total) by mouth 2 (two)  times daily with a meal.   gabapentin 300 MG capsule Commonly known as:  NEURONTIN TAKE 1 CAPSULE(300 MG) BY MOUTH TWICE DAILY   glucose blood test strip Commonly known as:  TRUE METRIX BLOOD GLUCOSE TEST USE TO CHECK BLOOD SUGARS THREE TIMES DAILY   insulin aspart 100 UNIT/ML FlexPen Commonly known as:  NOVOLOG FLEXPEN Inject 10 Units into the skin 3 (three) times daily with meals.   insulin glargine 100 UNIT/ML injection Commonly known as:  LANTUS Inject 0.3 mLs (30 Units total) into the skin at bedtime.   LANTUS SOLOSTAR 100 UNIT/ML Solostar Pen Generic drug:  Insulin Glargine INJECT 40 UNITS EVERY NIGHT AT BEDTIME AS DIRECTED   Insulin Pen Needle 31G X 5 MM Misc Commonly known as:  EASY TOUCH PEN NEEDLES ADMINISTER LANTUS ONCE NIGHTLY   oxybutynin 5 MG tablet Commonly known as:  DITROPAN TAKE 1 TABLET(5 MG) BY MOUTH DAILY   rivaroxaban 20 MG Tabs tablet Commonly known as:  XARELTO Take 1 tablet (20 mg total) by mouth daily with supper.   simvastatin 40 MG tablet Commonly known as:  ZOCOR Take 1 tablet (40 mg total) by mouth at bedtime.   tamsulosin 0.4 MG Caps capsule Commonly known as:  FLOMAX TAKE 1 CAPSULE(0.4 MG) BY MOUTH DAILY AFTER BREAKFAST   TRUEPLUS LANCETS 33G Misc USE TO CHECK BLOOD SUGARS THREE TIMES DAY   Vitamin D (Ergocalciferol) 50000 units Caps capsule Commonly known as:  DRISDOL Take 1 capsule (50,000 Units total) by mouth every 7 (seven) days.       Allergies:  Allergies  Allergen Reactions  . Metformin And Related Diarrhea  . Codeine Rash    All over the body    Past Medical History:  Diagnosis Date  . Clotting disorder (HCC)    DVT  . Diabetes mellitus    type II  . Diverticulosis   . DVT (deep venous thrombosis) ([place])   . Glaucoma    no eye drops   . Hepatic cyst   . Hypercholesterolemia   . Hypertension   . Myocardial infarction ([place])    very mild long ago   . Renal cyst   . Sleep apnea   . Tubulovillous adenoma       Past Surgical History:  Procedure Laterality Date  . BLADDER NECK RECONSTRUCTION  02/03/2012   Procedure: BLADDER  NECK REPAIR;  Surgeon: Adin Hector, MD;  Location: [facility];  Service: General;;  . COLONOSCOPY  02/2013   polyp removal(mult.)  . KNEE SURGERY  2004   left  . POLYPECTOMY    . PROCTOSCOPY  02/03/2012   Procedure: PROCTOSCOPY;  Surgeon: Adin Hector, MD;  Location: [facility];  Service: General;;  . STOMACH SURGERY  02/2012  . TEE WITHOUT CARDIOVERSION N/A 10/05/2016   Procedure: TRANSESOPHAGEAL ECHOCARDIOGRAM (TEE);  Surgeon: Sanda Klein, MD;  Location: [facility] ENDOSCOPY;  Service: Cardiovascular;  Laterality: N/A;    Family History  Problem Relation Age of Onset  . Hypertension Mother   . Diabetes Mother   . Cancer Mother        ? location  . Colon cancer Father 81  . Colon polyps Neg Hx     Social History:  reports that  has never smoked. he has never used smokeless tobacco. He reports that he does not drink alcohol or use drugs.   Review of Systems   Lipid history: Has been taking Zocor 40 mg with good control, followed by PCP    Lab Results  Component Value Date   CHOL 161 12/15/2016   HDL 52.10 12/15/2016   LDLCALC 87 12/15/2016   LDLDIRECT 118.0 11/11/2015   TRIG 108.0 12/15/2016   CHOLHDL 3 12/15/2016           Hypertension:Not present, previously on 5 mg lisinopril until 1/18  Most recent eye exam was In 03/2017  Most recent foot exam: 9/18  RENAL function is unchanged with continuing Farxiga 10 mg  Lab Results  Component Value Date   CREATININE 1.18 11/18/2017   BUN 14 11/18/2017   NA 138 11/18/2017   K 3.6 11/18/2017   CL 102 11/18/2017   CO2 27 11/18/2017      LABS:  Office Visit on 11/21/2017  Component Date Value Ref Range Status  . POC Glucose 11/21/2017 145* 70 - 99 mg/dl Final  Lab on 11/18/2017  Component Date Value Ref Range Status  . Sodium 11/18/2017 138  135 - 145 mEq/L Final  . Potassium 11/18/2017 3.6  3.5 - 5.1  mEq/L Final  . Chloride 11/18/2017 102  96 - 112 mEq/L Final  . CO2 11/18/2017 27  19 - 32 mEq/L Final  . Glucose, Bld 11/18/2017 250* 70 - 99 mg/dL Final  . BUN 11/18/2017 14  6 - 23 mg/dL Final  . Creatinine, Ser 11/18/2017 1.18  0.40 - 1.50 mg/dL Final  . Total Bilirubin 11/18/2017 0.7  0.2 - 1.2 mg/dL Final  . Alkaline Phosphatase 11/18/2017 61  39 - 117 U/L Final  . AST 11/18/2017 18  0 - 37 U/L Final  . ALT 11/18/2017 24  0 - 53 U/L Final  . Total Protein 11/18/2017 7.2  6.0 - 8.3 g/dL Final  . Albumin 11/18/2017 4.4  3.5 - 5.2 g/dL Final  . Calcium 11/18/2017 8.9  8.4 - 10.5 mg/dL Final  . GFR 11/18/2017 78.10  >60.00 mL/min Final  . Hgb A1c MFr Bld 11/18/2017 8.7* 4.6 - 6.5 % Final   Glycemic Control Guidelines for People with Diabetes:Non Diabetic:  <6%Goal of Therapy: <7%Additional Action Suggested:  >8%     Physical Examination:  BP 134/90 (BP Location: Left Arm, Patient Position: Sitting, Cuff Size: Small)   Pulse 72   Temp 97.9 F (36.6 C) (Oral)   Resp 16   Ht 5\' 10"  (1.778 m)   Wt 195 lb (88.5 kg)  SpO2 98%   BMI 27.98 kg/m          ASSESSMENT:  Diabetes type 2, uncontrolled    See history of present illness for detailed discussion of current diabetes management, blood sugar patterns and problems identified   He is on a regimen of basal bolus insulin and Farxiga   His  most recent A1c is 8.7, previously was down to 7.9 Most likely his blood sugars are not as well controlled because of inconsistent diet and getting an excessive amount of carbohydrate and sometimes high-fat meals Not exercising also Blood sugars are the highest after dinner Most likely his Lantus may not be working 24 hours with his bedtime dose   PLAN:     Start [place] U-200 when he finishes Lantus  Meanwhile he will switch to Lantus to the morning and use 38 units  Consultation with dietitian  Cut back on cereal and have more low fat protein in the morning  Increase NovoLog  by 2 units for now  More blood sugars about 2 hours after eating and need to bring his monitor for review in 6 weeks  Discussed blood sugar targets at various times and given handout  Also discussed differences between different basal insulins and timing of injection of both basal and bolus  Patient Instructions  Take Lantus 38 units in am  When out then start [place] same doses  Novolog 14 at meals     Counseling time on subjects discussed in assessment and plan sections is over 50% of today's 25 minute visit  Elayne Snare 11/21/2017, 1:37 PM   Note: This office note was prepared with Dragon voice recognition system technology. Any transcriptional errors that result from this process are unintentional.

## 2017-11-21 NOTE — Patient Instructions (Addendum)
Take Lantus 38 units in am  When out then start [place] same doses  Novolog 14 at meals

## 2017-11-21 NOTE — Progress Notes (Signed)
p 

## 2017-11-22 DIAGNOSIS — M4620 Osteomyelitis of vertebra, site unspecified: Secondary | ICD-10-CM | POA: Diagnosis not present

## 2017-11-23 DIAGNOSIS — M4620 Osteomyelitis of vertebra, site unspecified: Secondary | ICD-10-CM | POA: Diagnosis not present

## 2017-11-24 DIAGNOSIS — M4620 Osteomyelitis of vertebra, site unspecified: Secondary | ICD-10-CM | POA: Diagnosis not present

## 2017-11-24 DIAGNOSIS — Z981 Arthrodesis status: Secondary | ICD-10-CM | POA: Diagnosis not present

## 2017-11-24 DIAGNOSIS — G062 Extradural and subdural abscess, unspecified: Secondary | ICD-10-CM | POA: Diagnosis not present

## 2017-11-24 DIAGNOSIS — M4324 Fusion of spine, thoracic region: Secondary | ICD-10-CM | POA: Diagnosis not present

## 2017-11-24 DIAGNOSIS — E139 Other specified diabetes mellitus without complications: Secondary | ICD-10-CM | POA: Diagnosis not present

## 2017-11-24 DIAGNOSIS — M4624 Osteomyelitis of vertebra, thoracic region: Secondary | ICD-10-CM | POA: Diagnosis not present

## 2017-11-24 DIAGNOSIS — R2689 Other abnormalities of gait and mobility: Secondary | ICD-10-CM | POA: Diagnosis not present

## 2017-11-25 DIAGNOSIS — M4620 Osteomyelitis of vertebra, site unspecified: Secondary | ICD-10-CM | POA: Diagnosis not present

## 2017-11-26 DIAGNOSIS — M4620 Osteomyelitis of vertebra, site unspecified: Secondary | ICD-10-CM | POA: Diagnosis not present

## 2017-11-27 DIAGNOSIS — M4620 Osteomyelitis of vertebra, site unspecified: Secondary | ICD-10-CM | POA: Diagnosis not present

## 2017-11-28 DIAGNOSIS — M4620 Osteomyelitis of vertebra, site unspecified: Secondary | ICD-10-CM | POA: Diagnosis not present

## 2017-11-29 DIAGNOSIS — M4620 Osteomyelitis of vertebra, site unspecified: Secondary | ICD-10-CM | POA: Diagnosis not present

## 2017-11-30 DIAGNOSIS — M4620 Osteomyelitis of vertebra, site unspecified: Secondary | ICD-10-CM | POA: Diagnosis not present

## 2017-12-01 DIAGNOSIS — M4620 Osteomyelitis of vertebra, site unspecified: Secondary | ICD-10-CM | POA: Diagnosis not present

## 2017-12-16 ENCOUNTER — Ambulatory Visit: Payer: Medicare HMO | Admitting: Dietician

## 2017-12-18 ENCOUNTER — Other Ambulatory Visit: Payer: Self-pay | Admitting: Internal Medicine

## 2017-12-18 DIAGNOSIS — E118 Type 2 diabetes mellitus with unspecified complications: Secondary | ICD-10-CM

## 2017-12-18 DIAGNOSIS — Z794 Long term (current) use of insulin: Secondary | ICD-10-CM

## 2017-12-19 ENCOUNTER — Telehealth: Payer: Self-pay | Admitting: Endocrinology

## 2017-12-19 ENCOUNTER — Encounter: Payer: Self-pay | Admitting: Dietician

## 2017-12-19 ENCOUNTER — Encounter: Payer: Medicare HMO | Attending: Endocrinology | Admitting: Dietician

## 2017-12-19 DIAGNOSIS — E118 Type 2 diabetes mellitus with unspecified complications: Secondary | ICD-10-CM | POA: Insufficient documentation

## 2017-12-19 DIAGNOSIS — Z713 Dietary counseling and surveillance: Secondary | ICD-10-CM | POA: Insufficient documentation

## 2017-12-19 DIAGNOSIS — Z794 Long term (current) use of insulin: Secondary | ICD-10-CM | POA: Diagnosis not present

## 2017-12-19 NOTE — Telephone Encounter (Signed)
Patient came into office to get a refill on  ACCU CHECK test strips.  I do not see this in the patients medication list  Please advise     [facility], [address] AT [place]

## 2017-12-19 NOTE — Telephone Encounter (Signed)
I contacted the patient and requested a call back from the patient to discuss which meter he is using so we can send the correct strips in.

## 2017-12-19 NOTE — Telephone Encounter (Signed)
He is using the Accu-Chek guide, documented in my note

## 2017-12-19 NOTE — Patient Instructions (Addendum)
Consider adding the Vitamin D3 back (2,000 units daily).  Ask the aid to cook dinner. Healthier Hungry Meal Dinners:  But consider other options.   [place] Breast Dinner (15 grams fat, 66 grams carbs)  [employer] (16 grams fat, 55 grams carbs) [place] (baked fish, baked potato with little fat, salad or slaw, roll rather than hush puppies or fries)   Look for dinners that have less than 20 grams fat and around 60 grams carbs) Consider a vegetable plate a couple of times per week  Beans, sweet potato, vegetables   Per Dr. Ronnie Derby notes:  Start [place] Aas in the morning.  Give 38 units. Take 14 units Novolog before each meal (before breakfast, lunch, and dinner) Check your blood sugar more often 2 hours after you eat  Bring your meter to all of your appointment with Dr. Dwyane Dee.   Half of the plate should be vegetables Quarter of the plate a lean protein (beans or meat) Quarter of the plate a starch or 2 Fruit for dessert if desired.

## 2017-12-19 NOTE — Progress Notes (Signed)
Diabetes Self-Management Education  Visit Type: Follow-up  Appt. Start Time: 1100 Appt. End Time: 1225  12/19/2017  [name], identified by name and date of birth, is a 72 y.o. male with a diagnosis of Diabetes: 2  .  Patient known from previous admit and was last seen by an RD in October.  He had a back surgery about 1 year ago.  Patient is here today with his brother.  His sister-in-law organizes his medication.  He has an aide 2 hours per day daily and she does light house keeping and gets his breakfast but he is able to fix his breakfast and has few needs for care of his home.  Brother is to speak with her about starting to cook for patient and leave it for him in the refrigerator to reheat.  Patient does not cook except for breakfast and sandwiches.  His diet is very high in sodium, fat, processed foods with little vegetables as he is relying on TV dinners.  He has been walking in the house.  He walks with a cane but walks quite quickly.    Patient states that he was taking Farxiga, 30 units of Lantus each day at 2 pm, and 30 units of Novolog with breakfast and dinner.  This is different that prescibed.  He has used his last dose of Lantus and is now changing to [place].  Provided patient with instructions regarding his insulin per Dr. Ronnie Derby notes.    ASSESSMENT  Height 5\' 11"  (1.803 m), weight 197 lb (89.4 kg). Body mass index is 27.48 kg/m.  Diabetes Self-Management Education - 12/19/17 1123      Visit Information   Visit Type  Follow-up      Health Coping   How would you rate your overall health?  Good      Psychosocial Assessment   Patient Belief/Attitude about Diabetes  Motivated to manage diabetes    Self-care barriers  Debilitated state due to current medical condition    Self-management support  Doctor's office;Family    Other persons present  Patient;Family Member brother    Patient Concerns  Nutrition/Meal planning;Glycemic Control;Problem Solving    Special Needs  None    Preferred Learning Style  No preference indicated    Learning Readiness  Ready    How often do you need to have someone help you when you read instructions, pamphlets, or other written materials from your doctor or pharmacy?  1 - Never    What is the last grade level you completed in school?  12th grade      Pre-Education Assessment   Patient understands the diabetes disease and treatment process.  Needs Review    Patient understands incorporating nutritional management into lifestyle.  Needs Review    Patient undertands incorporating physical activity into lifestyle.  Needs Review    Patient understands using medications safely.  Needs Review    Patient understands monitoring blood glucose, interpreting and using results  Needs Review    Patient understands prevention, detection, and treatment of acute complications.  Needs Review    Patient understands prevention, detection, and treatment of chronic complications.  Needs Review    Patient understands how to develop strategies to address psychosocial issues.  Needs Review    Patient understands how to develop strategies to promote health/change behavior.  Needs Review      Complications   Last HgB A1C per patient/outside source  8.7 % 11/18/17 increased from 7.9% 09/16/17    How  often do you check your blood sugar?  1-2 times/day    Fasting Blood glucose range (mg/dL)  130-179    Postprandial Blood glucose range (mg/dL)  130-179    Number of hypoglycemic episodes per month  2    Can you tell when your blood sugar is low?  Yes    What do you do if your blood sugar is low?  ate a meal    Have you had a dilated eye exam in the past 12 months?  Yes    Have you had a dental exam in the past 12 months?  No dentures    Are you checking your feet?  No      Dietary Intake   Breakfast   plain Cheerios, 2% milk and sometimes with eggs, bacon, and coffee, sweet and low, milk 8    Snack (morning)  NABS peanut butter cracker  or PB&J sandwich on [employer]    Lunch  banana sandwich or lunch meat sandwich, canned fruit, chips,  1:30    Snack (afternoon)  NABS or chips    Dinner  TV dinner (Hungry Man)- meat, vegetable, starch, small dessert    Snack (evening)  chips    Beverage(s)  water, coffee, 2% milk, 1/2 cup diet cranberry juice daily      Exercise   Exercise Type  Light (walking / raking leaves)      Patient Education   Previous Diabetes Education  Yes (please comment) 07/2017    Nutrition management   Role of diet in the treatment of diabetes and the relationship between the three main macronutrients and blood glucose level;Food label reading, portion sizes and measuring food.;Meal timing in regards to the patients' current diabetes medication.;Information on hints to eating out and maintain blood glucose control.;Meal options for control of blood glucose level and chronic complications.    Physical activity and exercise   Role of exercise on diabetes management, blood pressure control and cardiac health.;Identified with patient nutritional and/or medication changes necessary with exercise.    Medications  Reviewed patients medication for diabetes, action, purpose, timing of dose and side effects.    Monitoring  Purpose and frequency of SMBG.;Daily foot exams;Yearly dilated eye exam    Acute complications  Taught treatment of hypoglycemia - the 15 rule.    Psychosocial adjustment  Worked with patient to identify barriers to care and solutions    Personal strategies to promote health  Lifestyle issues that need to be addressed for better diabetes care      Individualized Goals (developed by patient)   Nutrition  General guidelines for healthy choices and portions discussed    Physical Activity  Exercise 3-5 times per week;30 minutes per day    Medications  take my medication as prescribed    Monitoring   test my blood glucose as discussed    Problem Solving  caregiver role    Reducing Risk  examine blood glucose  patterns;do foot checks daily;treat hypoglycemia with 15 grams of carbs if blood glucose less than 70mg /dL;increase portions of nuts and seeds    Health Coping  discuss diabetes with (comment) MD, RD, CDE      Post-Education Assessment   Patient understands the diabetes disease and treatment process.  Demonstrates understanding / competency    Patient understands incorporating nutritional management into lifestyle.  Demonstrates understanding / competency    Patient undertands incorporating physical activity into lifestyle.  Demonstrates understanding / competency    Patient understands using medications safely.  Demonstrates understanding / competency    Patient understands monitoring blood glucose, interpreting and using results  Demonstrates understanding / competency    Patient understands prevention, detection, and treatment of acute complications.  Demonstrates understanding / competency    Patient understands prevention, detection, and treatment of chronic complications.  Demonstrates understanding / competency    Patient understands how to develop strategies to address psychosocial issues.  Demonstrates understanding / competency    Patient understands how to develop strategies to promote health/change behavior.  Demonstrates understanding / competency      Outcomes   Expected Outcomes  Other (comment) demonstrated interest in learning but barriers to change    Future DMSE  PRN    Program Status  Completed      Subsequent Visit   Since your last visit have you continued or begun to take your medications as prescribed?  Yes    Since your last visit have you had your blood pressure checked?  Yes    Is your most recent blood pressure lower, unchanged, or higher since your last visit?  Unchanged    Since your last visit have you experienced any weight changes?  No change    Since your last visit, are you checking your blood glucose at least once a day?  Yes       Individualized Plan  for Diabetes Self-Management Training:   Learning Objective:  Patient will have a greater understanding of diabetes self-management. Patient education plan is to attend individual and/or group sessions per assessed needs and concerns.   Plan:   Patient Instructions  Consider adding the Vitamin D3 back (2,000 units daily).  Ask the aid to cook dinner. Healthier Hungry Meal Dinners:  But consider other options.   [place] Breast Dinner (15 grams fat, 66 grams carbs)  [employer] (16 grams fat, 55 grams carbs) [place] (baked fish, baked potato with little fat, salad or slaw, roll rather than hush puppies or fries)   Look for dinners that have less than 20 grams fat and around 60 grams carbs) Consider a vegetable plate a couple of times per week  Beans, sweet potato, vegetables   Per Dr. Ronnie Derby notes:  Start Tyler Aas in the morning.  Give 38 units. Take 14 units Novolog before each meal (before breakfast, lunch, and dinner) Check your blood sugar more often 2 hours after you eat  Bring your meter to all of your appointment with Dr. Dwyane Dee.   Half of the plate should be vegetables Quarter of the plate a lean protein (beans or meat) Quarter of the plate a starch or 2 Fruit for dessert if desired.   Expected Outcomes:  Other (comment)(demonstrated interest in learning but barriers to change)  Education material provided: Food label handouts, A1C conversion sheet, Meal plan card, My Plate and Snack sheet, 1800 calorie meal plans (5days)  If problems or questions, patient to contact team via:  Phone  Future DSME appointment: PRN

## 2017-12-20 MED ORDER — GLUCOSE BLOOD VI STRP: 1.0000 | ORAL_STRIP | Freq: Three times a day (TID) | 12 refills | Status: DC

## 2017-12-20 NOTE — Telephone Encounter (Signed)
Rx sent. See meds.  

## 2017-12-22 DIAGNOSIS — Z981 Arthrodesis status: Secondary | ICD-10-CM | POA: Diagnosis not present

## 2017-12-22 DIAGNOSIS — M4324 Fusion of spine, thoracic region: Secondary | ICD-10-CM | POA: Diagnosis not present

## 2017-12-22 DIAGNOSIS — E139 Other specified diabetes mellitus without complications: Secondary | ICD-10-CM | POA: Diagnosis not present

## 2017-12-22 DIAGNOSIS — M4624 Osteomyelitis of vertebra, thoracic region: Secondary | ICD-10-CM | POA: Diagnosis not present

## 2017-12-22 DIAGNOSIS — G062 Extradural and subdural abscess, unspecified: Secondary | ICD-10-CM | POA: Diagnosis not present

## 2017-12-22 DIAGNOSIS — R2689 Other abnormalities of gait and mobility: Secondary | ICD-10-CM | POA: Diagnosis not present

## 2017-12-29 ENCOUNTER — Other Ambulatory Visit (INDEPENDENT_AMBULATORY_CARE_PROVIDER_SITE_OTHER): Payer: Medicare HMO

## 2017-12-29 DIAGNOSIS — E118 Type 2 diabetes mellitus with unspecified complications: Secondary | ICD-10-CM | POA: Diagnosis not present

## 2017-12-29 DIAGNOSIS — Z794 Long term (current) use of insulin: Secondary | ICD-10-CM | POA: Diagnosis not present

## 2017-12-29 LAB — BASIC METABOLIC PANEL
BUN: 21 mg/dL (ref 6–23)
CHLORIDE: 104 meq/L (ref 96–112)
CO2: 28 mEq/L (ref 19–32)
CREATININE: 1.02 mg/dL (ref 0.40–1.50)
Calcium: 10 mg/dL (ref 8.4–10.5)
GFR: 92.37 mL/min (ref >60.00)
Glucose, Bld: 169 mg/dL — ABNORMAL HIGH (ref 70–99)
Potassium: 4 mEq/L (ref 3.5–5.1)
Sodium: 140 mEq/L (ref 135–145)

## 2017-12-30 LAB — FRUCTOSAMINE: FRUCTOSAMINE: 331 umol/L — AB (ref 0–285)

## 2018-01-01 NOTE — Progress Notes (Signed)
Patient ID: Charles Parker, male   DOB: 03/01/1946, 72 y.o.   MRN: [id number]          Reason for Appointment: Follow-up for Type 2 Diabetes  Referring physician: Scarlette Calico   History of Present Illness:          Date of diagnosis of type 2 diabetes mellitus: 2014        Background history:   He has been on various oral hypoglycemic drugs since the onset including metformin, Januvia and Amaryl Was also on Invokana in 2015 for about a year but not clear why this was stopped His level of control has been quite variable with highest A1c 13.6 in 06/2015  He has been on insulin since about 10/2014 as documented in his record  Recent history:   INSULIN regimen is:  [place] 30  units bedtime , NovoLog 14 units before meals    Non-insulin hypoglycemic drugs the patient is taking LPF:[id number] 10 mg daily  His most recent A1c was 8.7  His fructosamine is still high at 331  Current management, blood sugar patterns and problems identified:  Since his blood sugars are higher on the last visit and inconsistent he was switched from Lantus to [place]   However even though he was told to take 38 units he is still taking only 30units and not clear why   He is given conflicting replies about 12 much NovoLog he is taking probably taking 14 although previously taking 12  He may sometimes forget to take the insulin before eating and may take it up to 15 minutes later  Checking blood sugars mostly FASTING  Even though he has talked to the dietitian he is still large consistent with his diet and cutting back high fat foods  He is still drinking some regular soft drinks periodically His weight is gradually increasing        Side effects from medications have been: Hives from metformin  Compliance with the medical regimen: Inconsistent  Glucose monitoring:  done 2-3 times a   day usually        Glucometer: Accu-Chek guide      Blood Glucose readings by :  Mean values apply above for all  meters except median for One Touch  PRE-MEAL Fasting Lunch Dinner Bedtime Overall  Glucose range:  139-184 ?  123-315    Mean/median: 158     181   POST-MEAL PC Breakfast PC Lunch PC Dinner  Glucose range:   143, 202  113-341  Mean/median:       Self-care: The diet that the patient has been following is: tries to limit [employer] .      Typical meal intake: Breakfast is eggs/cereal or oatmeal usually, lunch: Cold cut sandwiches.  Dinner chicken and vegetables.   Snacks will be chips, peanut butter crackers Supper at 6 pm                Dietician visit, most recent:3/19               Exercise: some walking around his apartment area and is not able to do much because of leg weakness   Weight history:  Wt Readings from Last 3 Encounters:  01/02/18 198 lb (89.8 kg)  12/19/17 197 lb (89.4 kg)  11/21/17 195 lb (88.5 kg)    Glycemic control:   Lab Results  Component Value Date   HGBA1C 8.7 (H) 11/18/2017   HGBA1C 7.9 (H) 09/16/2017   HGBA1C 9.1  06/13/2017   Lab Results  Component Value Date   MICROALBUR 1.0 02/08/2017   LDLCALC 87 12/15/2016   CREATININE 1.02 12/29/2017   Lab Results  Component Value Date   MICRALBCREAT 0.5 02/08/2017    Lab Results  Component Value Date   FRUCTOSAMINE 331 (H) 12/29/2017   FRUCTOSAMINE 334 (H) 08/05/2017   FRUCTOSAMINE 382 (H) 06/28/2017      Allergies as of 01/02/2018      Reactions   Metformin And Related Diarrhea   Codeine Rash   All over the body      Medication List        Accurate as of 01/02/18  1:10 PM. Always use your most recent med list.          Alcohol Swabs Pads Use to clean area before insulin injections. DX E11.8   dapagliflozin propanediol 10 MG Tabs tablet Commonly known as:  FARXIGA Take 10 mg by mouth daily.   ferrous sulfate 325 (65 FE) MG tablet Take 1 tablet (325 mg total) by mouth 2 (two) times daily with a meal.   gabapentin 300 MG capsule Commonly known as:  NEURONTIN TAKE 1  CAPSULE(300 MG) BY MOUTH TWICE DAILY   glucose blood test strip Commonly known as:  ACCU-CHEK GUIDE 1 each by Other route 3 (three) times daily. Use as instructed   insulin aspart 100 UNIT/ML FlexPen Commonly known as:  NOVOLOG FLEXPEN Inject 10 Units into the skin 3 (three) times daily with meals.   NOVOLOG FLEXPEN 100 UNIT/ML FlexPen Generic drug:  insulin aspart ADMINISTER 10 UNITS UNDER THE SKIN THREE TIMES DAILY WITH MEALS   Insulin Degludec 200 UNIT/ML Sopn Commonly known as:  TRESIBA FLEXTOUCH Inject 38 Units into the skin daily before breakfast.   Insulin Pen Needle 31G X 5 MM Misc Commonly known as:  EASY TOUCH PEN NEEDLES ADMINISTER LANTUS ONCE NIGHTLY   multivitamin with minerals Tabs tablet Take 1 tablet by mouth daily.   oxybutynin 5 MG tablet Commonly known as:  DITROPAN TAKE 1 TABLET(5 MG) BY MOUTH DAILY   rivaroxaban 20 MG Tabs tablet Commonly known as:  XARELTO Take 1 tablet (20 mg total) by mouth daily with supper.   simvastatin 40 MG tablet Commonly known as:  ZOCOR Take 1 tablet (40 mg total) by mouth at bedtime.   tamsulosin 0.4 MG Caps capsule Commonly known as:  FLOMAX TAKE 1 CAPSULE(0.4 MG) BY MOUTH DAILY AFTER BREAKFAST   TRUEPLUS LANCETS 33G Misc USE TO CHECK BLOOD SUGARS THREE TIMES DAY   Vitamin D (Ergocalciferol) 50000 units Caps capsule Commonly known as:  DRISDOL Take 1 capsule (50,000 Units total) by mouth every 7 (seven) days.       Allergies:  Allergies  Allergen Reactions  . Metformin And Related Diarrhea  . Codeine Rash    All over the body    Past Medical History:  Diagnosis Date  . Clotting disorder (HCC)    DVT  . Diabetes mellitus    type II  . Diverticulosis   . DVT (deep venous thrombosis) ([place])   . Glaucoma    no eye drops   . Hepatic cyst   . Hypercholesterolemia   . Hypertension   . Myocardial infarction ([place])    very mild long ago   . Renal cyst   . Sleep apnea   . Tubulovillous adenoma      Past Surgical History:  Procedure Laterality Date  . BLADDER NECK RECONSTRUCTION  02/03/2012   Procedure: BLADDER  NECK REPAIR;  Surgeon: Adin Hector, MD;  Location: [facility] ORS;  Service: General;;  . COLONOSCOPY  02/2013   polyp removal(mult.)  . KNEE SURGERY  2004   left  . POLYPECTOMY    . PROCTOSCOPY  02/03/2012   Procedure: PROCTOSCOPY;  Surgeon: Adin Hector, MD;  Location: [facility];  Service: General;;  . STOMACH SURGERY  02/2012  . TEE WITHOUT CARDIOVERSION N/A 10/05/2016   Procedure: TRANSESOPHAGEAL ECHOCARDIOGRAM (TEE);  Surgeon: Sanda Klein, MD;  Location: [facility] ENDOSCOPY;  Service: Cardiovascular;  Laterality: N/A;    Family History  Problem Relation Age of Onset  . Hypertension Mother   . Diabetes Mother   . Cancer Mother        ? location  . Colon cancer Father 47  . Colon polyps Neg Hx     Social History:  reports that he has never smoked. He has never used smokeless tobacco. He reports that he does not drink alcohol or use drugs.   Review of Systems   Lipid history: Has been taking Zocor 40 mg with previously good control, followed by PCP    Lab Results  Component Value Date   CHOL 161 12/15/2016   HDL 52.10 12/15/2016   LDLCALC 87 12/15/2016   LDLDIRECT 118.0 11/11/2015   TRIG 108.0 12/15/2016   CHOLHDL 3 12/15/2016           Hypertension:Not present   Most recent eye exam was In 03/2017  Most recent foot exam: 9/18  RENAL function is stable with continuing Farxiga 10 mg  Lab Results  Component Value Date   CREATININE 1.02 12/29/2017   BUN 21 12/29/2017   NA 140 12/29/2017   K 4.0 12/29/2017   CL 104 12/29/2017   CO2 28 12/29/2017      LABS:  Lab on 12/29/2017  Component Date Value Ref Range Status  . Sodium 12/29/2017 140  135 - 145 mEq/L Final  . Potassium 12/29/2017 4.0  3.5 - 5.1 mEq/L Final  . Chloride 12/29/2017 104  96 - 112 mEq/L Final  . CO2 12/29/2017 28  19 - 32 mEq/L Final  . Glucose, Bld 12/29/2017 169* 70 - 99 mg/dL  Final  . BUN 12/29/2017 21  6 - 23 mg/dL Final  . Creatinine, Ser 12/29/2017 1.02  0.40 - 1.50 mg/dL Final  . Calcium 12/29/2017 10.0  8.4 - 10.5 mg/dL Final  . GFR 12/29/2017 92.37  >60.00 mL/min Final  . Fructosamine 12/29/2017 331* 0 - 285 umol/L Final   Comment: Published reference interval for apparently healthy subjects between age 52 and 63 is 23 - 285 umol/L and in a poorly controlled diabetic population is 228 - 563 umol/L with a mean of 396 umol/L.     Physical Examination:  BP 138/72 (BP Location: Left Arm, Patient Position: Sitting, Cuff Size: Normal)   Pulse 86   Ht 5\' 11"  (1.803 m)   Wt 198 lb (89.8 kg)   SpO2 98%   BMI 27.62 kg/m          ASSESSMENT:  Diabetes type 2, uncontrolled    See history of present illness for detailed discussion of current diabetes management, blood sugar patterns and problems identified   He is on a regimen of basal bolus insulin and Farxiga  His blood sugars are still not well controlled He is still having difficulty understanding directions for insulin, glucose monitoring and consistent diet Fructosamine is 331, higher than target and unchanged from previous level  PLAN:    He will take Tresiba U-200, 34 units daily  He will make sure he checks his blood sugars after meals more often  Cut back on regular soft drinks  He will possibly need higher dose of NovoLog at evening meal if he has consistently high readings after eating meals also  Low-fat diet  Check A1c on the next visit  Follow instructions given by dietitian previously  Patient Instructions  TRESIBA 34 UNITS DAILY  LESS TESTING IN AM AND MORE 2-3 HRS AFTER ANY MEAL  All diet drinks       Elayne Snare 01/02/2018, 1:10 PM   Note: This office note was prepared with Dragon voice recognition system technology. Any transcriptional errors that result from this process are unintentional.

## 2018-01-02 ENCOUNTER — Encounter: Payer: Self-pay | Admitting: Endocrinology

## 2018-01-02 ENCOUNTER — Ambulatory Visit (INDEPENDENT_AMBULATORY_CARE_PROVIDER_SITE_OTHER): Payer: Medicare HMO | Admitting: Endocrinology

## 2018-01-02 VITALS — BP 138/72 | HR 86 | Ht 71.0 in | Wt 198.0 lb

## 2018-01-02 DIAGNOSIS — E118 Type 2 diabetes mellitus with unspecified complications: Secondary | ICD-10-CM | POA: Diagnosis not present

## 2018-01-02 DIAGNOSIS — Z794 Long term (current) use of insulin: Secondary | ICD-10-CM | POA: Diagnosis not present

## 2018-01-02 NOTE — Patient Instructions (Signed)
TRESIBA 34 UNITS DAILY  LESS TESTING IN AM AND MORE 2-3 HRS AFTER ANY MEAL  All diet drinks

## 2018-01-04 ENCOUNTER — Ambulatory Visit (INDEPENDENT_AMBULATORY_CARE_PROVIDER_SITE_OTHER): Payer: Medicare HMO | Admitting: *Deleted

## 2018-01-04 VITALS — BP 128/74 | HR 73 | Resp 18 | Ht 71.0 in | Wt 200.0 lb

## 2018-01-04 DIAGNOSIS — Z0000 Encounter for general adult medical examination without abnormal findings: Secondary | ICD-10-CM

## 2018-01-04 NOTE — Progress Notes (Addendum)
Subjective:   Charles Parker is a 72 y.o. male who presents for Medicare Annual/Subsequent preventive examination.  Review of Systems:  No ROS.  Medicare Wellness Visit. Additional risk factors are reflected in the social history.  Cardiac Risk Factors include: advanced age (>50men, >64 women);diabetes mellitus;dyslipidemia;family history of premature cardiovascular disease;male gender Sleep patterns: gets up 1-2 times nightly to void and sleeps 7 hours nightly.    Home Safety/Smoke Alarms: Feels safe in home. Smoke alarms in place.  Living environment; residence and Firearm Safety: 1-story house/ trailer, equipment: Radio producer, Type: Narrow [employer], [place], Type: Conservation officer, nature and [employer], Type: Tub Surveyor, quantity, no firearms. Aide comes daily to assist with ADLs,  Lives alone, no needs for DME, good support system Seat Belt Safety/Bike Helmet: Wears seat belt.   PSA-  Lab Results  Component Value Date   PSA 1.49 08/17/2016   PSA 3.06 07/14/2015   PSA 3.45 11/01/2014       Objective:    Vitals: BP 128/74   Pulse 73   Resp 18   Ht 5\' 11"  (1.803 m)   Wt 200 lb (90.7 kg)   SpO2 98%   BMI 27.89 kg/m   Body mass index is 27.89 kg/m.  Advanced Directives 01/04/2018 12/19/2017 07/05/2017 12/13/2016 12/10/2016 10/29/2016 10/22/2016  Does Patient Have a Medical Advance Directive? No Yes No No No Yes Yes  Type of Advance Directive - - - - - - Living will  Does patient want to make changes to medical advance directive? - - - - - No - Patient declined Yes (Inpatient - patient defers changing a medical advance directive at this time)  Copy of [place] in Chart? - - - - - - -  Would patient like information on creating a medical advance directive? Yes (ED - Information included in AVS) - No - Patient declined No - Patient declined - - -  Pre-existing out of facility DNR order (yellow form or pink MOST form) - - - - - - -    Tobacco Social History   Tobacco Use   Smoking Status Never Smoker  Smokeless Tobacco Never Used     Counseling given: Not Answered   Past Medical History:  Diagnosis Date  . Clotting disorder (HCC)    DVT  . Diabetes mellitus    type II  . Diverticulosis   . DVT (deep venous thrombosis) ([place])   . Glaucoma    no eye drops   . Hepatic cyst   . Hypercholesterolemia   . Hypertension   . Renal cyst   . Sleep apnea   . Tubulovillous adenoma    Past Surgical History:  Procedure Laterality Date  . BLADDER NECK RECONSTRUCTION  02/03/2012   Procedure: BLADDER NECK REPAIR;  Surgeon: Adin Hector, MD;  Location: [facility];  Service: General;;  . COLONOSCOPY  02/2013   polyp removal(mult.)  . KNEE SURGERY  2004   left  . POLYPECTOMY    . PROCTOSCOPY  02/03/2012   Procedure: PROCTOSCOPY;  Surgeon: Adin Hector, MD;  Location: [facility];  Service: General;;  . STOMACH SURGERY  02/2012  . TEE WITHOUT CARDIOVERSION N/A 10/05/2016   Procedure: TRANSESOPHAGEAL ECHOCARDIOGRAM (TEE);  Surgeon: Sanda Klein, MD;  Location: [facility] ENDOSCOPY;  Service: Cardiovascular;  Laterality: N/A;   Family History  Problem Relation Age of Onset  . Hypertension Mother   . Diabetes Mother   . Cancer Mother        ?  location  . Colon cancer Father 28  . Colon polyps Neg Hx    Social History   Socioeconomic History  . Marital status: Widowed    Spouse name: Not on file  . Number of children: 2  . Years of education: 74  . Highest education level: Not on file  Occupational History  . Occupation: retired    Comment: Education officer, museum  . Financial resource strain: Not very hard  . Food insecurity:    Worry: Never true    Inability: Never true  . Transportation needs:    Medical: No    Non-medical: No  Tobacco Use  . Smoking status: Never Smoker  . Smokeless tobacco: Never Used  Substance and Sexual Activity  . Alcohol use: No    Alcohol/week: 0.0 oz    Comment: once every two months  . Drug use: No  . Sexual activity: Not  Currently  Lifestyle  . Physical activity:    Days per week: 5 days    Minutes per session: 30 min  . Stress: Only a little  Relationships  . Social connections:    Talks on phone: More than three times a week    Gets together: More than three times a week    Attends religious service: More than 4 times per year    Active member of club or organization: Yes    Attends meetings of clubs or organizations: More than 4 times per year    Relationship status: Widowed  Other Topics Concern  . Not on file  Social History Narrative   Patient lives at home alone.. Patient is retired. Patient has high school education.   Right handed.- Both   Caffeine- Coffee and soda    Outpatient Encounter Medications as of 01/04/2018  Medication Sig  . Alcohol Swabs PADS Use to clean area before insulin injections. DX E11.8  . dapagliflozin propanediol (FARXIGA) 10 MG TABS tablet Take 10 mg by mouth daily.  . ferrous sulfate 325 (65 FE) MG tablet Take 1 tablet (325 mg total) by mouth 2 (two) times daily with a meal.  . gabapentin (NEURONTIN) 300 MG capsule TAKE 1 CAPSULE(300 MG) BY MOUTH TWICE DAILY  . glucose blood (ACCU-CHEK GUIDE) test strip 1 each by Other route 3 (three) times daily. Use as instructed  . insulin aspart (NOVOLOG FLEXPEN) 100 UNIT/ML FlexPen Inject 10 Units into the skin 3 (three) times daily with meals.  . Insulin Degludec (TRESIBA FLEXTOUCH) 200 UNIT/ML SOPN Inject 38 Units into the skin daily before breakfast.  . Multiple Vitamin (MULTIVITAMIN WITH MINERALS) TABS tablet Take 1 tablet by mouth daily.  Marland Kitchen NOVOLOG FLEXPEN 100 UNIT/ML FlexPen ADMINISTER 10 UNITS UNDER THE SKIN THREE TIMES DAILY WITH MEALS  . oxybutynin (DITROPAN) 5 MG tablet TAKE 1 TABLET(5 MG) BY MOUTH DAILY  . rivaroxaban (XARELTO) 20 MG TABS tablet Take 1 tablet (20 mg total) by mouth daily with supper.  . simvastatin (ZOCOR) 40 MG tablet Take 1 tablet (40 mg total) by mouth at bedtime.  . tamsulosin (FLOMAX) 0.4 MG  CAPS capsule TAKE 1 CAPSULE(0.4 MG) BY MOUTH DAILY AFTER BREAKFAST  . TRUEPLUS LANCETS 33G MISC USE TO CHECK BLOOD SUGARS THREE TIMES DAY  . [DISCONTINUED] Insulin Pen Needle (EASY TOUCH PEN NEEDLES) 31G X 5 MM MISC ADMINISTER LANTUS ONCE NIGHTLY (Patient not taking: Reported on 01/04/2018)  . [DISCONTINUED] Vitamin D, Ergocalciferol, (DRISDOL) 50000 units CAPS capsule Take 1 capsule (50,000 Units total) by mouth every 7 (seven)  days. (Patient not taking: Reported on 01/04/2018)   No facility-administered encounter medications on file as of 01/04/2018.     Activities of Daily Living In your present state of health, do you have any difficulty performing the following activities: 01/04/2018 03/24/2017  Hearing? N N  Vision? N Y  Difficulty concentrating or making decisions? Y N  Walking or climbing stairs? Y Y  Dressing or bathing? Y Y  Doing errands, shopping? [name]  Preparing Food and eating ? Y -  Using the Toilet? N -  In the past six months, have you accidently leaked urine? N -  Do you have problems with loss of bowel control? N -  Managing your Medications? Y -  Managing your Finances? Y -  Housekeeping or managing your Housekeeping? Y -  Some recent data might be hidden    Patient Care Team: Janith Lima, MD as PCP - General (Internal Medicine) Wilford Corner, MD as Consulting Physician (Gastroenterology) Philemon Kingdom, MD as Consulting Physician (Internal Medicine) Gardiner Barefoot, DPM as Consulting Physician (Podiatry)   Assessment:   This is a routine wellness examination for Charles Parker. Physical assessment deferred to PCP.   Exercise Activities and Dietary recommendations Current Exercise Habits: Home exercise routine, Type of exercise: walking, Time (Minutes): 30, Frequency (Times/Week): 4, Weekly Exercise (Minutes/Week): 120, Intensity: Mild, Exercise limited by: orthopedic condition(s)  Diet (meal preparation, eat out, water intake, caffeinated beverages, dairy products,  fruits and vegetables): in general, a "healthy" diet  , well balanced, sees RD for diabetes  Reviewed heart healthy and diabetic diet, encouraged patient to increase daily water intake. Diet education was provided via handout.   Goals    . Patient Stated     I want to improve my diet by decreasing sugar and carbohydrates. Continue to walk daily. Enjoy life and family       Fall Risk Fall Risk  01/04/2018 12/19/2017 07/05/2017 03/24/2017 03/24/2017  Falls in the past year? No No No No No  Number falls in past yr: - - - - -  Injury with Fall? - - - - -  Risk Factor Category  - - - - -  Risk for fall due to : Impaired balance/gait;Impaired mobility - - - Impaired balance/gait;Impaired mobility  Follow up - - - - -    Depression Screen PHQ 2/9 Scores 01/04/2018 12/19/2017 07/05/2017 03/24/2017  PHQ - 2 Score 1 0 0 0  PHQ- 9 Score 3 - - -    Cognitive Function MMSE - Mini Mental State Exam 01/04/2018 11/13/2015  Orientation to time 5 5  Orientation to Place 5 5  Registration 3 3  Attention/ Calculation 3 5  Recall 1 3  Language- name 2 objects 2 2  Language- repeat 1 1  Language- follow 3 step command 3 3  Language- read & follow direction 1 1  Write a sentence 1 1  Copy design 1 1  Total score 26 30        Immunization History  Administered Date(s) Administered  . Influenza, High Dose Seasonal PF 06/13/2017  . Influenza,inj,Quad PF,6+ Mos 06/20/2013, 07/10/2014  . Influenza-Unspecified 06/05/2015, 07/26/2016  . PPD Test 10/29/2016  . Pneumococcal Conjugate-13 06/20/2013, 07/23/2014  . Pneumococcal Polysaccharide-23 06/19/2015  . Tdap 06/20/2013   Screening Tests Health Maintenance  Topic Date Due  . URINE MICROALBUMIN  02/08/2018  . OPHTHALMOLOGY EXAM  03/15/2018  . INFLUENZA VACCINE  05/04/2018  . HEMOGLOBIN A1C  05/18/2018  . FOOT EXAM  06/28/2018  .  TETANUS/TDAP  06/21/2023  . COLONOSCOPY  07/14/2026  . Hepatitis C Screening  Completed  . PNA vac Low Risk Adult   Completed      Plan:  Continue to eat heart healthy diet (full of fruits, vegetables, whole grains, lean protein, water--limit salt, fat, and sugar intake) and increase physical activity as tolerated.  Continue doing brain stimulating activities (puzzles, reading, adult coloring books, staying active) to keep memory sharp.   I have personally reviewed and noted the following in the patient's chart:   . Medical and social history . Use of alcohol, tobacco or illicit drugs  . Current medications and supplements . Functional ability and status . Nutritional status . Physical activity . Advanced directives . List of other physicians . Vitals . Screenings to include cognitive, depression, and falls . Referrals and appointments  In addition, I have reviewed and discussed with patient certain preventive protocols, quality metrics, and best practice recommendations. A written personalized care plan for preventive services as well as general preventive health recommendations were provided to patient.    Medical screening examination/treatment/procedure(s) were performed by non-physician practitioner and as supervising physician I was immediately available for consultation/collaboration. I agree with above. Scarlette Calico, MD    Michiel Cowboy, RN  01/04/2018

## 2018-01-04 NOTE — Patient Instructions (Addendum)
[email or web address] or down load app on smart phone  Aunt Berenice Primas website lists multiple social resources for individuals such as: food, health, money, house hold goods, transit, medical supplies, job training and legal services.  Continue to eat heart healthy diet (full of fruits, vegetables, whole grains, lean protein, water--limit salt, fat, and sugar intake) and increase physical activity as tolerated.  Continue doing brain stimulating activities (puzzles, reading, adult coloring books, staying active) to keep memory sharp.   Charles Parker , Thank you for taking time to come for your Medicare Wellness Visit. I appreciate your ongoing commitment to your health goals. Please review the following plan we discussed and let me know if I can assist you in the future.   These are the goals we discussed: Goals    . Patient Stated     I want to improve my diet by decreasing sugar and carbohydrate. Continue to walk daily. Enjoy life and family       This is a list of the screening recommended for you and due dates:  Health Maintenance  Topic Date Due  . Urine Protein Check  02/08/2018  . Eye exam for diabetics  03/15/2018  . Flu Shot  05/04/2018  . Hemoglobin A1C  05/18/2018  . Complete foot exam   06/28/2018  . Tetanus Vaccine  06/21/2023  . Colon Cancer Screening  07/14/2026  .  Hepatitis C: One time screening is recommended by Center for Disease Control  (CDC) for  adults born from [birth year] through 1965.   Completed  . Pneumonia vaccines  Completed

## 2018-01-08 DIAGNOSIS — M4620 Osteomyelitis of vertebra, site unspecified: Secondary | ICD-10-CM | POA: Diagnosis not present

## 2018-01-09 DIAGNOSIS — M4620 Osteomyelitis of vertebra, site unspecified: Secondary | ICD-10-CM | POA: Diagnosis not present

## 2018-01-10 DIAGNOSIS — M4620 Osteomyelitis of vertebra, site unspecified: Secondary | ICD-10-CM | POA: Diagnosis not present

## 2018-01-11 ENCOUNTER — Encounter: Payer: Self-pay | Admitting: Podiatry

## 2018-01-11 ENCOUNTER — Ambulatory Visit (INDEPENDENT_AMBULATORY_CARE_PROVIDER_SITE_OTHER): Payer: Medicare HMO | Admitting: Podiatry

## 2018-01-11 DIAGNOSIS — M79674 Pain in right toe(s): Secondary | ICD-10-CM | POA: Diagnosis not present

## 2018-01-11 DIAGNOSIS — E119 Type 2 diabetes mellitus without complications: Secondary | ICD-10-CM | POA: Diagnosis not present

## 2018-01-11 DIAGNOSIS — M79675 Pain in left toe(s): Secondary | ICD-10-CM | POA: Diagnosis not present

## 2018-01-11 DIAGNOSIS — M4620 Osteomyelitis of vertebra, site unspecified: Secondary | ICD-10-CM | POA: Diagnosis not present

## 2018-01-11 DIAGNOSIS — M205X9 Other deformities of toe(s) (acquired), unspecified foot: Secondary | ICD-10-CM

## 2018-01-11 DIAGNOSIS — B351 Tinea unguium: Secondary | ICD-10-CM | POA: Diagnosis not present

## 2018-01-11 NOTE — Progress Notes (Signed)
Complaint:  Visit Type: Patient returns to my office for continued preventative foot care services. Complaint: Patient states" my nails have grown long and thick and become painful to walk and wear shoes" Patient has been diagnosed with DM with no foot complications. The patient presents for preventative foot care services. No changes to ROS  Podiatric Exam: Vascular: dorsalis pedis and posterior tibial pulses are palpable bilateral. Capillary return is immediate. Temperature gradient is WNL. Skin turgor WNL  Sensorium: Normal Semmes Weinstein monofilament test. Normal tactile sensation bilaterally. Nail Exam: Pt has thick disfigured discolored nails with subungual debris noted bilateral entire nail hallux through fifth toenails Ulcer Exam: There is no evidence of ulcer or pre-ulcerative changes or infection. Orthopedic Exam: Muscle tone and strength are WNL. No limitations in general ROM. No crepitus or effusions noted. Foot type and digits show no abnormalities. DJD 1st MPJ with overlapping toe B/L. Skin: No Porokeratosis. No infection or ulcers  Diagnosis:  Onychomycosis, , Pain in right toe, pain in left toes  Treatment & Plan Procedures and Treatment: Consent by patient was obtained for treatment procedures.   Debridement of mycotic and hypertrophic toenails, 1 through 5 bilateral and clearing of subungual debris. No ulceration, no infection noted.  Return Visit-Office Procedure: Patient instructed to return to the office for a follow up visit 3 months for continued evaluation and treatment.    Elija Mccamish DPM 

## 2018-01-12 DIAGNOSIS — M4620 Osteomyelitis of vertebra, site unspecified: Secondary | ICD-10-CM | POA: Diagnosis not present

## 2018-01-13 DIAGNOSIS — M4620 Osteomyelitis of vertebra, site unspecified: Secondary | ICD-10-CM | POA: Diagnosis not present

## 2018-01-14 DIAGNOSIS — M4620 Osteomyelitis of vertebra, site unspecified: Secondary | ICD-10-CM | POA: Diagnosis not present

## 2018-01-15 DIAGNOSIS — M4620 Osteomyelitis of vertebra, site unspecified: Secondary | ICD-10-CM | POA: Diagnosis not present

## 2018-01-16 DIAGNOSIS — M4620 Osteomyelitis of vertebra, site unspecified: Secondary | ICD-10-CM | POA: Diagnosis not present

## 2018-01-17 DIAGNOSIS — M4620 Osteomyelitis of vertebra, site unspecified: Secondary | ICD-10-CM | POA: Diagnosis not present

## 2018-01-18 DIAGNOSIS — M4620 Osteomyelitis of vertebra, site unspecified: Secondary | ICD-10-CM | POA: Diagnosis not present

## 2018-01-19 DIAGNOSIS — M4620 Osteomyelitis of vertebra, site unspecified: Secondary | ICD-10-CM | POA: Diagnosis not present

## 2018-01-20 DIAGNOSIS — M4620 Osteomyelitis of vertebra, site unspecified: Secondary | ICD-10-CM | POA: Diagnosis not present

## 2018-01-21 ENCOUNTER — Other Ambulatory Visit: Payer: Self-pay | Admitting: Internal Medicine

## 2018-01-21 DIAGNOSIS — M4620 Osteomyelitis of vertebra, site unspecified: Secondary | ICD-10-CM | POA: Diagnosis not present

## 2018-01-21 DIAGNOSIS — N3281 Overactive bladder: Secondary | ICD-10-CM

## 2018-01-23 DIAGNOSIS — M4620 Osteomyelitis of vertebra, site unspecified: Secondary | ICD-10-CM | POA: Diagnosis not present

## 2018-01-24 DIAGNOSIS — M4620 Osteomyelitis of vertebra, site unspecified: Secondary | ICD-10-CM | POA: Diagnosis not present

## 2018-01-25 DIAGNOSIS — M4620 Osteomyelitis of vertebra, site unspecified: Secondary | ICD-10-CM | POA: Diagnosis not present

## 2018-01-26 DIAGNOSIS — M4620 Osteomyelitis of vertebra, site unspecified: Secondary | ICD-10-CM | POA: Diagnosis not present

## 2018-01-27 DIAGNOSIS — M4620 Osteomyelitis of vertebra, site unspecified: Secondary | ICD-10-CM | POA: Diagnosis not present

## 2018-01-28 DIAGNOSIS — M4620 Osteomyelitis of vertebra, site unspecified: Secondary | ICD-10-CM | POA: Diagnosis not present

## 2018-01-29 DIAGNOSIS — M4620 Osteomyelitis of vertebra, site unspecified: Secondary | ICD-10-CM | POA: Diagnosis not present

## 2018-01-30 DIAGNOSIS — M4620 Osteomyelitis of vertebra, site unspecified: Secondary | ICD-10-CM | POA: Diagnosis not present

## 2018-01-31 DIAGNOSIS — M4620 Osteomyelitis of vertebra, site unspecified: Secondary | ICD-10-CM | POA: Diagnosis not present

## 2018-02-01 DIAGNOSIS — M4620 Osteomyelitis of vertebra, site unspecified: Secondary | ICD-10-CM | POA: Diagnosis not present

## 2018-02-02 DIAGNOSIS — M4620 Osteomyelitis of vertebra, site unspecified: Secondary | ICD-10-CM | POA: Diagnosis not present

## 2018-02-03 DIAGNOSIS — M4620 Osteomyelitis of vertebra, site unspecified: Secondary | ICD-10-CM | POA: Diagnosis not present

## 2018-02-04 DIAGNOSIS — M4620 Osteomyelitis of vertebra, site unspecified: Secondary | ICD-10-CM | POA: Diagnosis not present

## 2018-02-05 DIAGNOSIS — M4620 Osteomyelitis of vertebra, site unspecified: Secondary | ICD-10-CM | POA: Diagnosis not present

## 2018-02-06 ENCOUNTER — Other Ambulatory Visit: Payer: Self-pay | Admitting: Internal Medicine

## 2018-02-06 DIAGNOSIS — M4620 Osteomyelitis of vertebra, site unspecified: Secondary | ICD-10-CM | POA: Diagnosis not present

## 2018-02-06 DIAGNOSIS — E118 Type 2 diabetes mellitus with unspecified complications: Secondary | ICD-10-CM

## 2018-02-06 DIAGNOSIS — Z794 Long term (current) use of insulin: Secondary | ICD-10-CM

## 2018-02-07 DIAGNOSIS — M4620 Osteomyelitis of vertebra, site unspecified: Secondary | ICD-10-CM | POA: Diagnosis not present

## 2018-02-08 DIAGNOSIS — M4620 Osteomyelitis of vertebra, site unspecified: Secondary | ICD-10-CM | POA: Diagnosis not present

## 2018-02-09 DIAGNOSIS — M4620 Osteomyelitis of vertebra, site unspecified: Secondary | ICD-10-CM | POA: Diagnosis not present

## 2018-02-10 DIAGNOSIS — M4620 Osteomyelitis of vertebra, site unspecified: Secondary | ICD-10-CM | POA: Diagnosis not present

## 2018-02-11 DIAGNOSIS — M4620 Osteomyelitis of vertebra, site unspecified: Secondary | ICD-10-CM | POA: Diagnosis not present

## 2018-02-12 DIAGNOSIS — M4620 Osteomyelitis of vertebra, site unspecified: Secondary | ICD-10-CM | POA: Diagnosis not present

## 2018-02-13 DIAGNOSIS — M4620 Osteomyelitis of vertebra, site unspecified: Secondary | ICD-10-CM | POA: Diagnosis not present

## 2018-02-14 DIAGNOSIS — M4620 Osteomyelitis of vertebra, site unspecified: Secondary | ICD-10-CM | POA: Diagnosis not present

## 2018-02-15 DIAGNOSIS — M4620 Osteomyelitis of vertebra, site unspecified: Secondary | ICD-10-CM | POA: Diagnosis not present

## 2018-02-16 DIAGNOSIS — M4620 Osteomyelitis of vertebra, site unspecified: Secondary | ICD-10-CM | POA: Diagnosis not present

## 2018-02-17 DIAGNOSIS — M4620 Osteomyelitis of vertebra, site unspecified: Secondary | ICD-10-CM | POA: Diagnosis not present

## 2018-02-18 DIAGNOSIS — M4620 Osteomyelitis of vertebra, site unspecified: Secondary | ICD-10-CM | POA: Diagnosis not present

## 2018-02-20 ENCOUNTER — Other Ambulatory Visit: Payer: Self-pay | Admitting: Internal Medicine

## 2018-02-20 DIAGNOSIS — M4620 Osteomyelitis of vertebra, site unspecified: Secondary | ICD-10-CM | POA: Diagnosis not present

## 2018-02-20 DIAGNOSIS — E1140 Type 2 diabetes mellitus with diabetic neuropathy, unspecified: Secondary | ICD-10-CM

## 2018-02-20 MED ORDER — GABAPENTIN 300 MG PO CAPS: ORAL_CAPSULE | ORAL | 1 refills | Status: DC

## 2018-02-21 DIAGNOSIS — M4620 Osteomyelitis of vertebra, site unspecified: Secondary | ICD-10-CM | POA: Diagnosis not present

## 2018-02-22 DIAGNOSIS — M4620 Osteomyelitis of vertebra, site unspecified: Secondary | ICD-10-CM | POA: Diagnosis not present

## 2018-02-23 DIAGNOSIS — M4620 Osteomyelitis of vertebra, site unspecified: Secondary | ICD-10-CM | POA: Diagnosis not present

## 2018-02-24 DIAGNOSIS — M4620 Osteomyelitis of vertebra, site unspecified: Secondary | ICD-10-CM | POA: Diagnosis not present

## 2018-02-26 DIAGNOSIS — M4620 Osteomyelitis of vertebra, site unspecified: Secondary | ICD-10-CM | POA: Diagnosis not present

## 2018-02-27 DIAGNOSIS — M4620 Osteomyelitis of vertebra, site unspecified: Secondary | ICD-10-CM | POA: Diagnosis not present

## 2018-02-28 DIAGNOSIS — M4620 Osteomyelitis of vertebra, site unspecified: Secondary | ICD-10-CM | POA: Diagnosis not present

## 2018-03-01 DIAGNOSIS — M4620 Osteomyelitis of vertebra, site unspecified: Secondary | ICD-10-CM | POA: Diagnosis not present

## 2018-03-02 ENCOUNTER — Other Ambulatory Visit: Payer: Medicare HMO

## 2018-03-02 DIAGNOSIS — M4620 Osteomyelitis of vertebra, site unspecified: Secondary | ICD-10-CM | POA: Diagnosis not present

## 2018-03-03 ENCOUNTER — Telehealth: Payer: Self-pay | Admitting: Internal Medicine

## 2018-03-03 ENCOUNTER — Other Ambulatory Visit (INDEPENDENT_AMBULATORY_CARE_PROVIDER_SITE_OTHER): Payer: Medicare HMO

## 2018-03-03 ENCOUNTER — Other Ambulatory Visit: Payer: Medicare HMO

## 2018-03-03 DIAGNOSIS — M4620 Osteomyelitis of vertebra, site unspecified: Secondary | ICD-10-CM | POA: Diagnosis not present

## 2018-03-03 DIAGNOSIS — E118 Type 2 diabetes mellitus with unspecified complications: Secondary | ICD-10-CM | POA: Diagnosis not present

## 2018-03-03 DIAGNOSIS — Z794 Long term (current) use of insulin: Secondary | ICD-10-CM

## 2018-03-03 LAB — COMPREHENSIVE METABOLIC PANEL
ALT: 24 U/L (ref 0–53)
AST: 18 U/L (ref 0–37)
Albumin: 4.4 g/dL (ref 3.5–5.2)
Alkaline Phosphatase: 61 U/L (ref 39–117)
BILIRUBIN TOTAL: 0.7 mg/dL (ref 0.2–1.2)
BUN: 15 mg/dL (ref 6–23)
CALCIUM: 9.4 mg/dL (ref 8.4–10.5)
CO2: 26 mEq/L (ref 19–32)
Chloride: 104 mEq/L (ref 96–112)
Creatinine, Ser: 1.03 mg/dL (ref 0.40–1.50)
GFR: 91.29 mL/min (ref >60.00)
Glucose, Bld: 337 mg/dL — ABNORMAL HIGH (ref 70–99)
POTASSIUM: 4 meq/L (ref 3.5–5.1)
Sodium: 139 mEq/L (ref 135–145)
Total Protein: 7.5 g/dL (ref 6.0–8.3)

## 2018-03-03 LAB — MICROALBUMIN / CREATININE URINE RATIO
CREATININE, U: 50.4 mg/dL
Microalb Creat Ratio: 1.4 mg/g (ref 0.0–30.0)
Microalb, Ur: <0.7 mg/dL (ref 0.0–1.9)

## 2018-03-03 LAB — LIPID PANEL
CHOL/HDL RATIO: 4
Cholesterol: 165 mg/dL (ref 0–200)
HDL: 39.2 mg/dL (ref >39.00)
LDL Cholesterol: 91 mg/dL (ref 0–99)
NonHDL: 125.55
Triglycerides: 172 mg/dL — ABNORMAL HIGH (ref 0.0–149.0)
VLDL: 34.4 mg/dL (ref 0.0–40.0)

## 2018-03-03 LAB — HEMOGLOBIN A1C: Hgb A1c MFr Bld: 8.2 % — ABNORMAL HIGH (ref 4.6–6.5)

## 2018-03-03 NOTE — Telephone Encounter (Signed)
Patient is requesting a Disability Parking Placard to be completed.   Form has been completed & placed in providers box to sign.

## 2018-03-04 DIAGNOSIS — M4620 Osteomyelitis of vertebra, site unspecified: Secondary | ICD-10-CM | POA: Diagnosis not present

## 2018-03-05 DIAGNOSIS — M4620 Osteomyelitis of vertebra, site unspecified: Secondary | ICD-10-CM | POA: Diagnosis not present

## 2018-03-06 ENCOUNTER — Encounter: Payer: Self-pay | Admitting: Endocrinology

## 2018-03-06 ENCOUNTER — Ambulatory Visit (INDEPENDENT_AMBULATORY_CARE_PROVIDER_SITE_OTHER): Payer: Medicare HMO | Admitting: Endocrinology

## 2018-03-06 VITALS — BP 132/84 | HR 71 | Ht 71.0 in | Wt 193.4 lb

## 2018-03-06 DIAGNOSIS — E7800 Pure hypercholesterolemia, unspecified: Secondary | ICD-10-CM | POA: Diagnosis not present

## 2018-03-06 DIAGNOSIS — M4620 Osteomyelitis of vertebra, site unspecified: Secondary | ICD-10-CM | POA: Diagnosis not present

## 2018-03-06 DIAGNOSIS — E1165 Type 2 diabetes mellitus with hyperglycemia: Secondary | ICD-10-CM

## 2018-03-06 DIAGNOSIS — Z794 Long term (current) use of insulin: Secondary | ICD-10-CM | POA: Diagnosis not present

## 2018-03-06 NOTE — Progress Notes (Signed)
Patient ID: Charles Parker, male   DOB: [birth year], 72 y.o.   MRN: [id number]          Reason for Appointment: Follow-up for Type 2 Diabetes  Referring physician: Scarlette Calico   History of Present Illness:          Date of diagnosis of type 2 diabetes mellitus: 2014        Background history:   He has been on various oral hypoglycemic drugs since the onset including metformin, Januvia and Amaryl Was also on Invokana in 2015 for about a year but not clear why this was stopped His level of control has been quite variable with highest A1c 13.6 in 06/2015  He has been on insulin since about 10/2014 as documented in his record  Recent history:   INSULIN regimen is:  [place] ?  18  units bedtime , NovoLog 14 units before meals    Non-insulin hypoglycemic drugs the patient is taking HBZ:JIRCVEL 10 mg daily  His most recent A1c was 8.7 and is now 8.2   Current management, blood sugar patterns and problems identified:  He is supposed to be taking [place] instead of Lantus but he thinks he is taking a gray pen  Also he was supposed to be taking 34 units basal insulin and he thinks he is taking only 18 units  Also instead of taking his NovoLog at lunchtime before eating he is taking at half an hour after eating even though he is at home most of the time  With breakfast his blood sugar appears to be high and was 337 after eating Cheerios last Friday, previously eating other kind of cereal but not oatmeal or eggs recently  He has seen the dietitian in March but not clear if he is following the diet consistently  He is apparently cutting back on regular soft drinks since his last visit  Unable to get a blood sugar profile for the last month because his meter has no data and not clear if he had the wrong date or time programmed on this   Side effects from medications have been: Hives from metformin  Compliance with the medical regimen: Inconsistent  Glucose monitoring:  done 2-3  times a   day usually        Glucometer: Accu-Chek guide      Blood Glucose readings by :   Recall: Fasting about 140-160, suppertime occasionally 200+  Previous readings:  PRE-MEAL Fasting Lunch Dinner Bedtime Overall  Glucose range:  139-184 ?  123-315    Mean/median: 158     181   POST-MEAL PC Breakfast PC Lunch PC Dinner  Glucose range:   143, 202  113-341  Mean/median:       Self-care: The diet that the patient has been following is: tries to limit [employer] .      Typical meal intake: Breakfast is eggs/cereal or oatmeal usually, lunch: Cold cut sandwiches.  Dinner chicken and vegetables.   Snacks will be chips, peanut butter crackers Supper at 6 pm                Dietician visit, most recent:3/19               Exercise: some walking around his apartment area and is not able to do much because of leg weakness   Weight history:  Wt Readings from Last 3 Encounters:  03/06/18 193 lb 6.4 oz (87.7 kg)  01/04/18 200 lb (90.7 kg)  01/02/18  198 lb (89.8 kg)    Glycemic control:   Lab Results  Component Value Date   HGBA1C 8.2 (H) 03/03/2018   HGBA1C 8.7 (H) 11/18/2017   HGBA1C 7.9 (H) 09/16/2017   Lab Results  Component Value Date   MICROALBUR <0.7 03/03/2018   LDLCALC 91 03/03/2018   CREATININE 1.03 03/03/2018   Lab Results  Component Value Date   MICRALBCREAT 1.4 03/03/2018    Lab Results  Component Value Date   FRUCTOSAMINE 331 (H) 12/29/2017   FRUCTOSAMINE 334 (H) 08/05/2017   FRUCTOSAMINE 382 (H) 06/28/2017      Allergies as of 03/06/2018      Reactions   Metformin And Related Diarrhea   Codeine Rash   All over the body      Medication List        Accurate as of 03/06/18  3:44 PM. Always use your most recent med list.          Alcohol Swabs Pads Use to clean area before insulin injections. DX E11.8   dapagliflozin propanediol 10 MG Tabs tablet Commonly known as:  FARXIGA Take 10 mg by mouth daily.   ferrous sulfate 325 (65 FE) MG  tablet Take 1 tablet (325 mg total) by mouth 2 (two) times daily with a meal.   gabapentin 300 MG capsule Commonly known as:  NEURONTIN TAKE 1 CAPSULE(300 MG) BY MOUTH TWICE DAILY   glucose blood test strip Commonly known as:  ACCU-CHEK GUIDE 1 each by Other route 3 (three) times daily. Use as instructed   insulin aspart 100 UNIT/ML FlexPen Commonly known as:  NOVOLOG FLEXPEN Inject 10 Units into the skin 3 (three) times daily with meals.   NOVOLOG FLEXPEN 100 UNIT/ML FlexPen Generic drug:  insulin aspart ADMINISTER 10 UNITS UNDER THE SKIN THREE TIMES DAILY WITH MEALS   Insulin Degludec 200 UNIT/ML Sopn Commonly known as:  TRESIBA FLEXTOUCH Inject 38 Units into the skin daily before breakfast.   Insulin Pen Needle 31G X 5 MM Misc Commonly known as:  EASY TOUCH PEN NEEDLES ADMINISTER LANTUS ONCE EVERY EVENING   multivitamin with minerals Tabs tablet Take 1 tablet by mouth daily.   oxybutynin 5 MG tablet Commonly known as:  DITROPAN TAKE 1 TABLET EVERY DAY (SUBSTITUTED FOR DITROPAN)   rivaroxaban 20 MG Tabs tablet Commonly known as:  XARELTO Take 1 tablet (20 mg total) by mouth daily with supper.   simvastatin 40 MG tablet Commonly known as:  ZOCOR Take 1 tablet (40 mg total) by mouth at bedtime.   tamsulosin 0.4 MG Caps capsule Commonly known as:  FLOMAX TAKE 1 CAPSULE(0.4 MG) BY MOUTH DAILY AFTER BREAKFAST   TRUEPLUS LANCETS 33G Misc USE TO CHECK BLOOD SUGARS THREE TIMES DAY       Allergies:  Allergies  Allergen Reactions  . Metformin And Related Diarrhea  . Codeine Rash    All over the body    Past Medical History:  Diagnosis Date  . Clotting disorder (HCC)    DVT  . Diabetes mellitus    type II  . Diverticulosis   . DVT (deep venous thrombosis) ([place])   . Glaucoma    no eye drops   . Hepatic cyst   . Hypercholesterolemia   . Hypertension   . Renal cyst   . Sleep apnea   . Tubulovillous adenoma     Past Surgical History:  Procedure  Laterality Date  . BLADDER NECK RECONSTRUCTION  02/03/2012   Procedure: BLADDER NECK REPAIR;  Surgeon: Adin Hector, MD;  Location: [facility] ORS;  Service: General;;  . COLONOSCOPY  02/2013   polyp removal(mult.)  . KNEE SURGERY  2004   left  . POLYPECTOMY    . PROCTOSCOPY  02/03/2012   Procedure: PROCTOSCOPY;  Surgeon: Adin Hector, MD;  Location: [facility];  Service: General;;  . STOMACH SURGERY  02/2012  . TEE WITHOUT CARDIOVERSION N/A 10/05/2016   Procedure: TRANSESOPHAGEAL ECHOCARDIOGRAM (TEE);  Surgeon: Sanda Klein, MD;  Location: [facility] ENDOSCOPY;  Service: Cardiovascular;  Laterality: N/A;    Family History  Problem Relation Age of Onset  . Hypertension Mother   . Diabetes Mother   . Cancer Mother        ? location  . Colon cancer Father 68  . Colon polyps Neg Hx     Social History:  reports that he has never smoked. He has never used smokeless tobacco. He reports that he does not drink alcohol or use drugs.   Review of Systems   Lipid history: Has been taking Zocor 40 mg with good control, followed by PCP    Lab Results  Component Value Date   CHOL 165 03/03/2018   HDL 39.20 03/03/2018   LDLCALC 91 03/03/2018   LDLDIRECT 118.0 11/11/2015   TRIG 172.0 (H) 03/03/2018   CHOLHDL 4 03/03/2018            Most recent eye exam was In 03/2017  Most recent foot exam: 9/18  RENAL function is stable with using Farxiga 10 mg  Lab Results  Component Value Date   CREATININE 1.03 03/03/2018   BUN 15 03/03/2018   NA 139 03/03/2018   K 4.0 03/03/2018   CL 104 03/03/2018   CO2 26 03/03/2018      LABS:  Appointment on 03/03/2018  Component Date Value Ref Range Status  . Microalb, Ur 03/03/2018 <0.7  0.0 - 1.9 mg/dL Final  . Creatinine,U 03/03/2018 50.4  mg/dL Final  . Microalb Creat Ratio 03/03/2018 1.4  0.0 - 30.0 mg/g Final  . Cholesterol 03/03/2018 165  0 - 200 mg/dL Final   ATP III Classification       Desirable:  < 200 mg/dL               Borderline High:  200 -  239 mg/dL          High:  > = 240 mg/dL  . Triglycerides 03/03/2018 172.0* 0.0 - 149.0 mg/dL Final   Normal:  <150 mg/dLBorderline High:  150 - 199 mg/dL  . HDL 03/03/2018 39.20  >39.00 mg/dL Final  . VLDL 03/03/2018 34.4  0.0 - 40.0 mg/dL Final  . LDL Cholesterol 03/03/2018 91  0 - 99 mg/dL Final  . Total CHOL/HDL Ratio 03/03/2018 4   Final                  Men          Women1/2 Average Risk     3.4          3.3Average Risk          5.0          4.42X Average Risk          9.6          7.13X Average Risk          15.0          11.0                      .  NonHDL 03/03/2018 125.55   Final   NOTE:  Non-HDL goal should be 30 mg/dL higher than patient's LDL goal (i.e. LDL goal of < 70 mg/dL, would have non-HDL goal of < 100 mg/dL)  . Sodium 03/03/2018 139  135 - 145 mEq/L Final  . Potassium 03/03/2018 4.0  3.5 - 5.1 mEq/L Final  . Chloride 03/03/2018 104  96 - 112 mEq/L Final  . CO2 03/03/2018 26  19 - 32 mEq/L Final  . Glucose, Bld 03/03/2018 337* 70 - 99 mg/dL Final  . BUN 03/03/2018 15  6 - 23 mg/dL Final  . Creatinine, Ser 03/03/2018 1.03  0.40 - 1.50 mg/dL Final  . Total Bilirubin 03/03/2018 0.7  0.2 - 1.2 mg/dL Final  . Alkaline Phosphatase 03/03/2018 61  39 - 117 U/L Final  . AST 03/03/2018 18  0 - 37 U/L Final  . ALT 03/03/2018 24  0 - 53 U/L Final  . Total Protein 03/03/2018 7.5  6.0 - 8.3 g/dL Final  . Albumin 03/03/2018 4.4  3.5 - 5.2 g/dL Final  . Calcium 03/03/2018 9.4  8.4 - 10.5 mg/dL Final  . GFR 03/03/2018 91.29  >60.00 mL/min Final  . Hgb A1c MFr Bld 03/03/2018 8.2* 4.6 - 6.5 % Final   Glycemic Control Guidelines for People with Diabetes:Non Diabetic:  <6%Goal of Therapy: <7%Additional Action Suggested:  >8%     Physical Examination:  BP 132/84 (BP Location: Left Arm, Patient Position: Sitting, Cuff Size: Normal)   Pulse 71   Ht 5\' 11"  (1.803 m)   Wt 193 lb 6.4 oz (87.7 kg)   SpO2 92%   BMI 26.97 kg/m          ASSESSMENT:  Diabetes type 2, recent BMI 27  See  history of present illness for detailed discussion of current diabetes management, blood sugar patterns and problems identified   He is on a regimen of basal bolus insulin and Farxiga  His blood sugars are still not well controlled with A1c 8.2  He has benefited from using [place] with his basal bolus insulin and weight is also a little better However not able to exercise which would be otherwise helpful He has not followed instructions for his basal insulin and not clear which insulin he is taking and how much Surprisingly his fasting readings are reportedly not as high even with taking only 18 units instead of 34  Also with the inconsistent diet his blood sugars can be over 300 as seen in the lab He is also not checking his sugars consistently after meals and difficult to know what his blood sugar patterns are since he has an incorrect date likely programmed on his meter  Also does not understand the need to take NovoLog before eating or other than after eating which he is doing at least at lunchtime  LIPIDS: LDL is below 100 and he will continue simvastatin  Blood pressure is high normal but will continue to follow, currently only on [place]:    He will take Tresiba U-200, 22 units daily for now  He needs to check blood sugars after meals consistently  Discussed blood sugar targets before and after meals and need to make sure at least after breakfast and supper his blood sugars are under 180  To take the lunchtime dose before eating consistently  He may take higher doses at suppertime if eating a large meal of NovoLog, up to 18 units  Needs follow-up with diabetes educator to help him  understand how to manage day-to-day with his diabetes treatment  Have balanced high-protein meals instead of cereal in the morning  Consider follow-up with dietitian  Patient Instructions  Check blood sugars on waking up  3/7  Also check blood sugars about 2 hours after a meal and do  this after different meals by rotation  Recommended blood sugar levels on waking up is 90-130 and about 2 hours after meal is 130-160  Please bring your blood sugar monitor to each visit, thank you  Avoid cereal in am   Tresiba 22 units  Counseling time on subjects discussed in assessment and plan sections is over 50% of today's 25 minute visit     Elayne Snare 03/06/2018, 3:44 PM   Note: This office note was prepared with Dragon voice recognition system technology. Any transcriptional errors that result from this process are unintentional.

## 2018-03-06 NOTE — Telephone Encounter (Signed)
Forms signed, Copy sent to scan.  LVM to inform patient the original is ready to be picked up.

## 2018-03-06 NOTE — Patient Instructions (Addendum)
Check blood sugars on waking up  3/7  Also check blood sugars about 2 hours after a meal and do this after different meals by rotation  Recommended blood sugar levels on waking up is 90-130 and about 2 hours after meal is 130-160  Please bring your blood sugar monitor to each visit, thank you  Avoid cereal in am   Tresiba 22 units

## 2018-03-07 DIAGNOSIS — M4620 Osteomyelitis of vertebra, site unspecified: Secondary | ICD-10-CM | POA: Diagnosis not present

## 2018-03-08 DIAGNOSIS — M4620 Osteomyelitis of vertebra, site unspecified: Secondary | ICD-10-CM | POA: Diagnosis not present

## 2018-03-09 DIAGNOSIS — M4620 Osteomyelitis of vertebra, site unspecified: Secondary | ICD-10-CM | POA: Diagnosis not present

## 2018-03-10 DIAGNOSIS — M4620 Osteomyelitis of vertebra, site unspecified: Secondary | ICD-10-CM | POA: Diagnosis not present

## 2018-03-11 DIAGNOSIS — M4620 Osteomyelitis of vertebra, site unspecified: Secondary | ICD-10-CM | POA: Diagnosis not present

## 2018-03-12 DIAGNOSIS — M4620 Osteomyelitis of vertebra, site unspecified: Secondary | ICD-10-CM | POA: Diagnosis not present

## 2018-03-13 DIAGNOSIS — M4620 Osteomyelitis of vertebra, site unspecified: Secondary | ICD-10-CM | POA: Diagnosis not present

## 2018-03-14 DIAGNOSIS — M4620 Osteomyelitis of vertebra, site unspecified: Secondary | ICD-10-CM | POA: Diagnosis not present

## 2018-03-15 DIAGNOSIS — M4620 Osteomyelitis of vertebra, site unspecified: Secondary | ICD-10-CM | POA: Diagnosis not present

## 2018-03-16 DIAGNOSIS — M4620 Osteomyelitis of vertebra, site unspecified: Secondary | ICD-10-CM | POA: Diagnosis not present

## 2018-03-17 DIAGNOSIS — M4620 Osteomyelitis of vertebra, site unspecified: Secondary | ICD-10-CM | POA: Diagnosis not present

## 2018-03-18 DIAGNOSIS — M4620 Osteomyelitis of vertebra, site unspecified: Secondary | ICD-10-CM | POA: Diagnosis not present

## 2018-03-20 ENCOUNTER — Telehealth: Payer: Self-pay | Admitting: Nutrition

## 2018-03-20 ENCOUNTER — Encounter: Payer: Medicare HMO | Attending: Endocrinology | Admitting: Nutrition

## 2018-03-20 DIAGNOSIS — Z713 Dietary counseling and surveillance: Secondary | ICD-10-CM | POA: Diagnosis not present

## 2018-03-20 DIAGNOSIS — E1165 Type 2 diabetes mellitus with hyperglycemia: Secondary | ICD-10-CM | POA: Diagnosis not present

## 2018-03-20 DIAGNOSIS — Z794 Long term (current) use of insulin: Secondary | ICD-10-CM | POA: Insufficient documentation

## 2018-03-20 DIAGNOSIS — M4620 Osteomyelitis of vertebra, site unspecified: Secondary | ICD-10-CM | POA: Diagnosis not present

## 2018-03-20 DIAGNOSIS — E118 Type 2 diabetes mellitus with unspecified complications: Secondary | ICD-10-CM

## 2018-03-20 NOTE — Telephone Encounter (Signed)
Will also take 10 units NovoLog at lunchtime

## 2018-03-20 NOTE — Telephone Encounter (Signed)
Pt. Is currently taking 38u of [place] q HS and Novolog 14u ac Bfast and supper.   His FBSs per memory are between 130-150, and acS 150-160

## 2018-03-20 NOTE — Progress Notes (Signed)
Patient is here with a friend to review blood sugars and insulin doses. His meter is does not show his blood sugars past 03/11/18.  He says he tested this AM and every morning and before supper.  FBSs are ususally "130s-140s.  Today it was 138.  Says readings before supper are usually in the 150s-160s.  He was given a new meter, with and date/time set correctly.  Insulin dose:  Novolog 14u, 1 1/2 hours before breakfast and supper, Toujeo 38u HS.  Says does not forget his insulin doses Typical day: Up at 6:30.  8AM: takes blood sugar and Novolog.  Bfast is 2 eggs with cereal, ([place]) and milk.  Coffee with 2 spenda pacets 10:30 8 crackers with peanut butter 2:30-3PM: Lunch: 1-2 sandwiches with few handfulls of chips.  Water to drink 6PM: [place], chicken or fish 4-6 ounces with 1 starchy veg., and 1 non starchy veg. And bread.  Water to drink. 8PM: Chips-2 handfuls.  Denies deserts, except for rarely (1X q 1-2 weeks)  Usually  a piece of cake.   After speaking with Dr. Dwyane Dee, Suggestions given: Take Novolog 10 min. Before all meals Test blood sugars at bedtime and before lunch 1-2X/wk.   Discussed low blood sugars--symptoms and treatments.  He has nothing at home for this.   Call office if low blood sugar.

## 2018-03-21 DIAGNOSIS — M4620 Osteomyelitis of vertebra, site unspecified: Secondary | ICD-10-CM | POA: Diagnosis not present

## 2018-03-21 NOTE — Patient Instructions (Signed)
Take Novolog 10 min. Before all meals Test blood sugars at bedtime and before lunch 1-2X/wk.    Call office if low blood sugar.

## 2018-03-21 NOTE — Telephone Encounter (Signed)
Patient was called and told of the need for Novolog 10u acL.  He repeated the dose correctly, and had no final questions

## 2018-03-22 DIAGNOSIS — M4620 Osteomyelitis of vertebra, site unspecified: Secondary | ICD-10-CM | POA: Diagnosis not present

## 2018-03-23 DIAGNOSIS — M4620 Osteomyelitis of vertebra, site unspecified: Secondary | ICD-10-CM | POA: Diagnosis not present

## 2018-03-27 DIAGNOSIS — M4620 Osteomyelitis of vertebra, site unspecified: Secondary | ICD-10-CM | POA: Diagnosis not present

## 2018-03-28 DIAGNOSIS — M4620 Osteomyelitis of vertebra, site unspecified: Secondary | ICD-10-CM | POA: Diagnosis not present

## 2018-03-29 DIAGNOSIS — M4620 Osteomyelitis of vertebra, site unspecified: Secondary | ICD-10-CM | POA: Diagnosis not present

## 2018-03-30 DIAGNOSIS — M4620 Osteomyelitis of vertebra, site unspecified: Secondary | ICD-10-CM | POA: Diagnosis not present

## 2018-03-31 DIAGNOSIS — M4620 Osteomyelitis of vertebra, site unspecified: Secondary | ICD-10-CM | POA: Diagnosis not present

## 2018-04-01 DIAGNOSIS — M4620 Osteomyelitis of vertebra, site unspecified: Secondary | ICD-10-CM | POA: Diagnosis not present

## 2018-04-02 DIAGNOSIS — M4620 Osteomyelitis of vertebra, site unspecified: Secondary | ICD-10-CM | POA: Diagnosis not present

## 2018-04-03 DIAGNOSIS — M4620 Osteomyelitis of vertebra, site unspecified: Secondary | ICD-10-CM | POA: Diagnosis not present

## 2018-04-04 DIAGNOSIS — M4620 Osteomyelitis of vertebra, site unspecified: Secondary | ICD-10-CM | POA: Diagnosis not present

## 2018-04-05 DIAGNOSIS — M4620 Osteomyelitis of vertebra, site unspecified: Secondary | ICD-10-CM | POA: Diagnosis not present

## 2018-04-07 DIAGNOSIS — M4620 Osteomyelitis of vertebra, site unspecified: Secondary | ICD-10-CM | POA: Diagnosis not present

## 2018-04-08 DIAGNOSIS — M4620 Osteomyelitis of vertebra, site unspecified: Secondary | ICD-10-CM | POA: Diagnosis not present

## 2018-04-09 DIAGNOSIS — M4620 Osteomyelitis of vertebra, site unspecified: Secondary | ICD-10-CM | POA: Diagnosis not present

## 2018-04-10 DIAGNOSIS — M4620 Osteomyelitis of vertebra, site unspecified: Secondary | ICD-10-CM | POA: Diagnosis not present

## 2018-04-11 DIAGNOSIS — M4620 Osteomyelitis of vertebra, site unspecified: Secondary | ICD-10-CM | POA: Diagnosis not present

## 2018-04-12 ENCOUNTER — Encounter: Payer: Self-pay | Admitting: Podiatry

## 2018-04-12 ENCOUNTER — Ambulatory Visit (INDEPENDENT_AMBULATORY_CARE_PROVIDER_SITE_OTHER): Payer: Medicare HMO | Admitting: Podiatry

## 2018-04-12 DIAGNOSIS — B351 Tinea unguium: Secondary | ICD-10-CM | POA: Diagnosis not present

## 2018-04-12 DIAGNOSIS — M4620 Osteomyelitis of vertebra, site unspecified: Secondary | ICD-10-CM | POA: Diagnosis not present

## 2018-04-12 DIAGNOSIS — M79674 Pain in right toe(s): Secondary | ICD-10-CM | POA: Diagnosis not present

## 2018-04-12 DIAGNOSIS — M79675 Pain in left toe(s): Secondary | ICD-10-CM | POA: Diagnosis not present

## 2018-04-12 DIAGNOSIS — M205X9 Other deformities of toe(s) (acquired), unspecified foot: Secondary | ICD-10-CM

## 2018-04-12 DIAGNOSIS — E119 Type 2 diabetes mellitus without complications: Secondary | ICD-10-CM

## 2018-04-12 NOTE — Progress Notes (Signed)
Complaint:  Visit Type: Patient returns to my office for continued preventative foot care services. Complaint: Patient states" my nails have grown long and thick and become painful to walk and wear shoes" Patient has been diagnosed with DM with no foot complications. The patient presents for preventative foot care services. No changes to ROS  Podiatric Exam: Vascular: dorsalis pedis and posterior tibial pulses are palpable bilateral. Capillary return is immediate. Temperature gradient is WNL. Skin turgor WNL  Sensorium: Normal Semmes Weinstein monofilament test. Normal tactile sensation bilaterally. Nail Exam: Pt has thick disfigured discolored nails with subungual debris noted bilateral entire nail hallux through fifth toenails Ulcer Exam: There is no evidence of ulcer or pre-ulcerative changes or infection. Orthopedic Exam: Muscle tone and strength are WNL. No limitations in general ROM. No crepitus or effusions noted. Foot type and digits show no abnormalities. DJD 1st MPJ with overlapping toe B/L. Skin: No Porokeratosis. No infection or ulcers  Diagnosis:  Onychomycosis, , Pain in right toe, pain in left toes  Treatment & Plan Procedures and Treatment: Consent by patient was obtained for treatment procedures.   Debridement of mycotic and hypertrophic toenails, 1 through 5 bilateral and clearing of subungual debris. No ulceration, no infection noted.  Return Visit-Office Procedure: Patient instructed to return to the office for a follow up visit 3 months for continued evaluation and treatment.    Gardiner Barefoot DPM

## 2018-04-13 DIAGNOSIS — M4620 Osteomyelitis of vertebra, site unspecified: Secondary | ICD-10-CM | POA: Diagnosis not present

## 2018-04-14 DIAGNOSIS — M4620 Osteomyelitis of vertebra, site unspecified: Secondary | ICD-10-CM | POA: Diagnosis not present

## 2018-04-15 DIAGNOSIS — M4620 Osteomyelitis of vertebra, site unspecified: Secondary | ICD-10-CM | POA: Diagnosis not present

## 2018-04-16 DIAGNOSIS — M4620 Osteomyelitis of vertebra, site unspecified: Secondary | ICD-10-CM | POA: Diagnosis not present

## 2018-04-17 DIAGNOSIS — M4620 Osteomyelitis of vertebra, site unspecified: Secondary | ICD-10-CM | POA: Diagnosis not present

## 2018-04-18 DIAGNOSIS — M4620 Osteomyelitis of vertebra, site unspecified: Secondary | ICD-10-CM | POA: Diagnosis not present

## 2018-04-19 DIAGNOSIS — M4620 Osteomyelitis of vertebra, site unspecified: Secondary | ICD-10-CM | POA: Diagnosis not present

## 2018-04-20 DIAGNOSIS — M4620 Osteomyelitis of vertebra, site unspecified: Secondary | ICD-10-CM | POA: Diagnosis not present

## 2018-04-21 DIAGNOSIS — M4620 Osteomyelitis of vertebra, site unspecified: Secondary | ICD-10-CM | POA: Diagnosis not present

## 2018-04-22 DIAGNOSIS — M4620 Osteomyelitis of vertebra, site unspecified: Secondary | ICD-10-CM | POA: Diagnosis not present

## 2018-04-25 ENCOUNTER — Telehealth: Payer: Self-pay

## 2018-04-25 DIAGNOSIS — E785 Hyperlipidemia, unspecified: Secondary | ICD-10-CM

## 2018-04-25 DIAGNOSIS — E113319 Type 2 diabetes mellitus with moderate nonproliferative diabetic retinopathy with macular edema, unspecified eye: Secondary | ICD-10-CM

## 2018-04-25 DIAGNOSIS — M4620 Osteomyelitis of vertebra, site unspecified: Secondary | ICD-10-CM | POA: Diagnosis not present

## 2018-04-25 NOTE — Telephone Encounter (Signed)
Rf request for simvastatin and farxiga to [facility]  Pt is due for an appt to follow up on chronic conditions. Can you call and schedule the follow up?

## 2018-04-26 DIAGNOSIS — M4620 Osteomyelitis of vertebra, site unspecified: Secondary | ICD-10-CM | POA: Diagnosis not present

## 2018-04-26 MED ORDER — SIMVASTATIN 40 MG PO TABS: 40.0000 mg | ORAL_TABLET | Freq: Every day | ORAL | 0 refills | Status: DC

## 2018-04-26 MED ORDER — DAPAGLIFLOZIN PROPANEDIOL 10 MG PO TABS: 10.0000 mg | ORAL_TABLET | Freq: Every day | ORAL | 0 refills | Status: DC

## 2018-04-26 NOTE — Telephone Encounter (Signed)
Appointment scheduled.

## 2018-04-27 DIAGNOSIS — M4620 Osteomyelitis of vertebra, site unspecified: Secondary | ICD-10-CM | POA: Diagnosis not present

## 2018-04-28 DIAGNOSIS — M4620 Osteomyelitis of vertebra, site unspecified: Secondary | ICD-10-CM | POA: Diagnosis not present

## 2018-04-29 DIAGNOSIS — M4620 Osteomyelitis of vertebra, site unspecified: Secondary | ICD-10-CM | POA: Diagnosis not present

## 2018-04-30 DIAGNOSIS — M4620 Osteomyelitis of vertebra, site unspecified: Secondary | ICD-10-CM | POA: Diagnosis not present

## 2018-05-01 DIAGNOSIS — M4620 Osteomyelitis of vertebra, site unspecified: Secondary | ICD-10-CM | POA: Diagnosis not present

## 2018-05-02 DIAGNOSIS — M4620 Osteomyelitis of vertebra, site unspecified: Secondary | ICD-10-CM | POA: Diagnosis not present

## 2018-05-03 DIAGNOSIS — M4620 Osteomyelitis of vertebra, site unspecified: Secondary | ICD-10-CM | POA: Diagnosis not present

## 2018-05-04 DIAGNOSIS — M4620 Osteomyelitis of vertebra, site unspecified: Secondary | ICD-10-CM | POA: Diagnosis not present

## 2018-05-05 ENCOUNTER — Other Ambulatory Visit (INDEPENDENT_AMBULATORY_CARE_PROVIDER_SITE_OTHER): Payer: Medicare HMO

## 2018-05-05 DIAGNOSIS — M4620 Osteomyelitis of vertebra, site unspecified: Secondary | ICD-10-CM | POA: Diagnosis not present

## 2018-05-05 DIAGNOSIS — E1165 Type 2 diabetes mellitus with hyperglycemia: Secondary | ICD-10-CM

## 2018-05-05 DIAGNOSIS — Z794 Long term (current) use of insulin: Secondary | ICD-10-CM

## 2018-05-05 LAB — BASIC METABOLIC PANEL
BUN: 11 mg/dL (ref 6–23)
CALCIUM: 9.3 mg/dL (ref 8.4–10.5)
CO2: 27 meq/L (ref 19–32)
CREATININE: 0.92 mg/dL (ref 0.40–1.50)
Chloride: 104 mEq/L (ref 96–112)
GFR: 103.95 mL/min (ref >60.00)
GLUCOSE: 223 mg/dL — AB (ref 70–99)
Potassium: 3.6 mEq/L (ref 3.5–5.1)
Sodium: 139 mEq/L (ref 135–145)

## 2018-05-06 DIAGNOSIS — M4620 Osteomyelitis of vertebra, site unspecified: Secondary | ICD-10-CM | POA: Diagnosis not present

## 2018-05-06 LAB — FRUCTOSAMINE: Fructosamine: 330 umol/L — ABNORMAL HIGH (ref 0–285)

## 2018-05-07 DIAGNOSIS — M4620 Osteomyelitis of vertebra, site unspecified: Secondary | ICD-10-CM | POA: Diagnosis not present

## 2018-05-08 ENCOUNTER — Encounter: Payer: Self-pay | Admitting: Endocrinology

## 2018-05-08 ENCOUNTER — Encounter: Payer: Medicare HMO | Admitting: Endocrinology

## 2018-05-08 DIAGNOSIS — M4620 Osteomyelitis of vertebra, site unspecified: Secondary | ICD-10-CM | POA: Diagnosis not present

## 2018-05-08 NOTE — Progress Notes (Signed)
This encounter was created in error - please disregard.

## 2018-05-09 DIAGNOSIS — M4620 Osteomyelitis of vertebra, site unspecified: Secondary | ICD-10-CM | POA: Diagnosis not present

## 2018-05-10 DIAGNOSIS — M4620 Osteomyelitis of vertebra, site unspecified: Secondary | ICD-10-CM | POA: Diagnosis not present

## 2018-05-11 ENCOUNTER — Telehealth: Payer: Self-pay | Admitting: Internal Medicine

## 2018-05-11 ENCOUNTER — Encounter: Payer: Self-pay | Admitting: Endocrinology

## 2018-05-11 ENCOUNTER — Ambulatory Visit (INDEPENDENT_AMBULATORY_CARE_PROVIDER_SITE_OTHER): Payer: Medicare HMO | Admitting: Endocrinology

## 2018-05-11 VITALS — BP 130/70 | HR 81 | Ht 71.0 in | Wt 203.4 lb

## 2018-05-11 DIAGNOSIS — Z794 Long term (current) use of insulin: Secondary | ICD-10-CM | POA: Diagnosis not present

## 2018-05-11 DIAGNOSIS — E1165 Type 2 diabetes mellitus with hyperglycemia: Secondary | ICD-10-CM

## 2018-05-11 DIAGNOSIS — M4620 Osteomyelitis of vertebra, site unspecified: Secondary | ICD-10-CM | POA: Diagnosis not present

## 2018-05-11 NOTE — Telephone Encounter (Signed)
Need referral to see linda Appointment 05/24/18

## 2018-05-11 NOTE — Patient Instructions (Addendum)
Orange pen: take  right before the meal, 30 units    GRAY PEN take 32 units in the evening along with the orange PEN at the same time  Check blood sugars on waking up  daily  Also check blood sugars about 2 hours after a meal and do this after different meals by rotation  Recommended blood sugar levels on waking up is 90-130 and about 2 hours after meal is 130-160  Please bring your blood sugar monitor to each visit, thank you  Bring your insulin with you when you come next time

## 2018-05-11 NOTE — Progress Notes (Signed)
Patient ID: Charles Parker, male   DOB: 1946-02-11, 72 y.o.   MRN: [id number]          Reason for Appointment: Follow-up for Type 2 Diabetes  Referring physician: Scarlette Calico   History of Present Illness:          Date of diagnosis of type 2 diabetes mellitus: 2014        Background history:   He has been on various oral hypoglycemic drugs since the onset including metformin, Januvia and Amaryl Was also on Invokana in 2015 for about a year but not clear why this was stopped His level of control has been quite variable with highest A1c 13.6 in 06/2015  He has been on insulin since about 10/2014 as documented in his record  Recent history:   INSULIN regimen is:  [place] ?  30  units at suppertime, NovoLog 32 units before breakfast and lunch, possibly at supper also  Non-insulin hypoglycemic drugs the patient is taking YOV:ZCHYIFO 10 mg daily  His most recent A1c was 8.2 However fructosamine is still high at 330  Patient has difficulty relating his insulin doses and follow instructions given for insulin doses Patient came alone to the office today  Current management, blood sugar patterns and problems identified:  He was previously taking 18 units of Tresiba but he thinks is not taking 30 units and not clear why he is changing this on his own  Very difficult to know how much compliance he has with his insulin regimen and whether he is taking any NovoLog at suppertime or not  However he demonstrated the correct dose being dialed on his pen when he was given a demonstration pen  He also thinks he is taking insulin consistently but cannot remember by name of the insulin, he thinks NovoLog is a black and red pen and the other insulin is black and gray  Checking his blood sugars only in the mornings and not consistently also  Not clear why his blood sugars have been still relatively high in the morning, late morning blood sugars appear to be much higher even though he thinks he is  taking 32 units of NovoLog at breakfast and not eating a large meal  No hypoglycemia reported   Side effects from medications have been: Hives from metformin  Compliance with the medical regimen: Inconsistent  Glucose monitoring:  done 2-3 times a   day usually        Glucometer: Accu-Chek guide      Blood Glucose readings by : Download  FASTING range 133-214 AFTER breakfast 178-315 Overall average 204  Self-care: The diet that the patient has been following is: tries to limit [employer] .      Typical meal intake: Breakfast is eggs/cereal or oatmeal usually, lunch: Cold cut sandwiches.  Dinner chicken and vegetables.   Snacks will be chips, peanut butter crackers Supper at 6 pm                Dietician visit, most recent:3/19               Exercise: some walking around his apartment area and is not able to do much because of leg weakness   Weight history:  Wt Readings from Last 3 Encounters:  05/11/18 203 lb 6.4 oz (92.3 kg)  05/08/18 203 lb 6.4 oz (92.3 kg)  03/06/18 193 lb 6.4 oz (87.7 kg)    Glycemic control:   Lab Results  Component Value Date  HGBA1C 8.2 (H) 03/03/2018   HGBA1C 8.7 (H) 11/18/2017   HGBA1C 7.9 (H) 09/16/2017   Lab Results  Component Value Date   MICROALBUR <0.7 03/03/2018   LDLCALC 91 03/03/2018   CREATININE 0.92 05/05/2018   Lab Results  Component Value Date   MICRALBCREAT 1.4 03/03/2018    Lab Results  Component Value Date   FRUCTOSAMINE 330 (H) 05/05/2018   FRUCTOSAMINE 331 (H) 12/29/2017   FRUCTOSAMINE 334 (H) 08/05/2017      Allergies as of 05/11/2018      Reactions   Metformin And Related Diarrhea   Codeine Rash   All over the body      Medication List        Accurate as of 05/11/18 11:59 PM. Always use your most recent med list.          Alcohol Swabs Pads Use to clean area before insulin injections. DX E11.8   dapagliflozin propanediol 10 MG Tabs tablet Commonly known as:  FARXIGA Take 10 mg by mouth  daily.   ferrous sulfate 325 (65 FE) MG tablet Take 1 tablet (325 mg total) by mouth 2 (two) times daily with a meal.   gabapentin 300 MG capsule Commonly known as:  NEURONTIN TAKE 1 CAPSULE(300 MG) BY MOUTH TWICE DAILY   glucose blood test strip 1 each by Other route 3 (three) times daily. Use as instructed   insulin aspart 100 UNIT/ML FlexPen Commonly known as:  NOVOLOG Inject 10 Units into the skin 3 (three) times daily with meals.   Insulin Degludec 200 UNIT/ML Sopn Inject 38 Units into the skin daily before breakfast.   Insulin Pen Needle 31G X 5 MM Misc ADMINISTER LANTUS ONCE EVERY EVENING   multivitamin with minerals Tabs tablet Take 1 tablet by mouth daily.   oxybutynin 5 MG tablet Commonly known as:  DITROPAN TAKE 1 TABLET EVERY DAY (SUBSTITUTED FOR DITROPAN)   rivaroxaban 20 MG Tabs tablet Commonly known as:  XARELTO Take 1 tablet (20 mg total) by mouth daily with supper.   simvastatin 40 MG tablet Commonly known as:  ZOCOR Take 1 tablet (40 mg total) by mouth at bedtime.   tamsulosin 0.4 MG Caps capsule Commonly known as:  FLOMAX TAKE 1 CAPSULE(0.4 MG) BY MOUTH DAILY AFTER BREAKFAST   TRUEPLUS LANCETS 33G Misc USE TO CHECK BLOOD SUGARS THREE TIMES DAY       Allergies:  Allergies  Allergen Reactions  . Metformin And Related Diarrhea  . Codeine Rash    All over the body    Past Medical History:  Diagnosis Date  . Clotting disorder (HCC)    DVT  . Diabetes mellitus    type II  . Diverticulosis   . DVT (deep venous thrombosis) ([place])   . Glaucoma    no eye drops   . Hepatic cyst   . Hypercholesterolemia   . Hypertension   . Renal cyst   . Sleep apnea   . Tubulovillous adenoma     Past Surgical History:  Procedure Laterality Date  . BLADDER NECK RECONSTRUCTION  02/03/2012   Procedure: BLADDER NECK REPAIR;  Surgeon: Adin Hector, MD;  Location: [facility];  Service: General;;  . COLONOSCOPY  02/2013   polyp removal(mult.)  . KNEE SURGERY   2004   left  . POLYPECTOMY    . PROCTOSCOPY  02/03/2012   Procedure: PROCTOSCOPY;  Surgeon: Adin Hector, MD;  Location: [facility];  Service: General;;  . STOMACH SURGERY  02/2012  .  TEE WITHOUT CARDIOVERSION N/A 10/05/2016   Procedure: TRANSESOPHAGEAL ECHOCARDIOGRAM (TEE);  Surgeon: Sanda Klein, MD;  [facility] ENDOSCOPY;  Service: Cardiovascular;  Laterality: N/A;    Family History  Problem Relation Age of Onset  . Hypertension Mother   . Diabetes Mother   . Cancer Mother        ? location  . Colon cancer Father 36  . Colon polyps Neg Hx     Social History:  reports that he has never smoked. He has never used smokeless tobacco. He reports that he does not drink alcohol or use drugs.   Review of Systems   Lipid history: Has been taking Zocor 40 mg with good control, followed by PCP    Lab Results  Component Value Date   CHOL 165 03/03/2018   HDL 39.20 03/03/2018   LDLCALC 91 03/03/2018   LDLDIRECT 118.0 11/11/2015   TRIG 172.0 (H) 03/03/2018   CHOLHDL 4 03/03/2018            Most recent eye exam was In 03/2017  Most recent foot exam: 9/18  RENAL function is stable with using Farxiga 10 mg  Lab Results  Component Value Date   CREATININE 0.92 05/05/2018   BUN 11 05/05/2018   NA 139 05/05/2018   K 3.6 05/05/2018   CL 104 05/05/2018   CO2 27 05/05/2018      LABS:  Lab on 05/05/2018  Component Date Value Ref Range Status  . Fructosamine 05/05/2018 330* 0 - 285 umol/L Final   Comment: Published reference interval for apparently healthy subjects between age 63 and 41 is 46 - 285 umol/L and in a poorly controlled diabetic population is 228 - 563 umol/L with a mean of 396 umol/L.   Marland Kitchen Sodium 05/05/2018 139  135 - 145 mEq/L Final  . Potassium 05/05/2018 3.6  3.5 - 5.1 mEq/L Final  . Chloride 05/05/2018 104  96 - 112 mEq/L Final  . CO2 05/05/2018 27  19 - 32 mEq/L Final  . Glucose, Bld 05/05/2018 223* 70 - 99 mg/dL Final  . BUN 05/05/2018 11  6 - 23 mg/dL  Final  . Creatinine, Ser 05/05/2018 0.92  0.40 - 1.50 mg/dL Final  . Calcium 05/05/2018 9.3  8.4 - 10.5 mg/dL Final  . GFR 05/05/2018 103.95  >60.00 mL/min Final    Physical Examination:  BP 130/70 (BP Location: Left Arm, Patient Position: Sitting, Cuff Size: Normal)   Pulse 81   Ht 5\' 11"  (1.803 m)   Wt 203 lb 6.4 oz (92.3 kg)   SpO2 94%   BMI 28.37 kg/m          ASSESSMENT:  Diabetes type 2, recent BMI 27  See history of present illness for detailed discussion of current diabetes management, blood sugar patterns and problems identified   He is on a regimen of basal bolus insulin and Farxiga  His blood sugars are still not well controlled with fructosamine 330  Even though he was supposed to take only 18 units of NovoLog and 22 of [place] he is taking about 30-32 units without apparent hypoglycemia Also not clear if he is taking his NovoLog with his basal insulin at suppertime He thinks he is taking basal insulin with a black and gray pen even though to see by supposed to be may be below  PLAN:    He will take NovoLog consistently with his basal insulin at suppertime and continue the same doses for now  He will need to  see the diabetes educator to review his day-to-day management and he will bring his insulin pens from home to know what he is exactly doing  Discussed importance of checking readings after meals which he is not doing  Also needs review of his diet because of his continued weight gain despite taking [place]  Patient Instructions  Orange pen: take  right before the meal, 30 units    GRAY PEN take 32 units in the evening along with the orange PEN at the same time  Check blood sugars on waking up  daily  Also check blood sugars about 2 hours after a meal and do this after different meals by rotation  Recommended blood sugar levels on waking up is 90-130 and about 2 hours after meal is 130-160  Please bring your blood sugar monitor to each visit, thank  you  Bring your insulin with you when you come next time        Elayne Snare 05/13/2018, 4:11 PM   Note: This office note was prepared with Dragon voice recognition system technology. Any transcriptional errors that result from this process are unintentional.

## 2018-05-12 DIAGNOSIS — M4620 Osteomyelitis of vertebra, site unspecified: Secondary | ICD-10-CM | POA: Diagnosis not present

## 2018-05-13 ENCOUNTER — Other Ambulatory Visit: Payer: Self-pay | Admitting: Endocrinology

## 2018-05-13 DIAGNOSIS — E1165 Type 2 diabetes mellitus with hyperglycemia: Secondary | ICD-10-CM

## 2018-05-13 DIAGNOSIS — M4620 Osteomyelitis of vertebra, site unspecified: Secondary | ICD-10-CM | POA: Diagnosis not present

## 2018-05-13 DIAGNOSIS — Z794 Long term (current) use of insulin: Secondary | ICD-10-CM

## 2018-05-14 DIAGNOSIS — M4620 Osteomyelitis of vertebra, site unspecified: Secondary | ICD-10-CM | POA: Diagnosis not present

## 2018-05-15 DIAGNOSIS — M4620 Osteomyelitis of vertebra, site unspecified: Secondary | ICD-10-CM | POA: Diagnosis not present

## 2018-05-16 DIAGNOSIS — M4620 Osteomyelitis of vertebra, site unspecified: Secondary | ICD-10-CM | POA: Diagnosis not present

## 2018-05-17 DIAGNOSIS — M4620 Osteomyelitis of vertebra, site unspecified: Secondary | ICD-10-CM | POA: Diagnosis not present

## 2018-05-18 ENCOUNTER — Other Ambulatory Visit (INDEPENDENT_AMBULATORY_CARE_PROVIDER_SITE_OTHER): Payer: Medicare HMO

## 2018-05-18 ENCOUNTER — Ambulatory Visit (INDEPENDENT_AMBULATORY_CARE_PROVIDER_SITE_OTHER): Payer: Medicare HMO | Admitting: Internal Medicine

## 2018-05-18 ENCOUNTER — Encounter: Payer: Self-pay | Admitting: Internal Medicine

## 2018-05-18 VITALS — BP 152/98 | HR 80 | Temp 97.9°F | Resp 16 | Ht 71.0 in | Wt 203.0 lb

## 2018-05-18 DIAGNOSIS — I10 Essential (primary) hypertension: Secondary | ICD-10-CM

## 2018-05-18 DIAGNOSIS — Z794 Long term (current) use of insulin: Secondary | ICD-10-CM

## 2018-05-18 DIAGNOSIS — E559 Vitamin D deficiency, unspecified: Secondary | ICD-10-CM | POA: Insufficient documentation

## 2018-05-18 DIAGNOSIS — E611 Iron deficiency: Secondary | ICD-10-CM | POA: Diagnosis not present

## 2018-05-18 DIAGNOSIS — E118 Type 2 diabetes mellitus with unspecified complications: Secondary | ICD-10-CM | POA: Diagnosis not present

## 2018-05-18 DIAGNOSIS — E113319 Type 2 diabetes mellitus with moderate nonproliferative diabetic retinopathy with macular edema, unspecified eye: Secondary | ICD-10-CM

## 2018-05-18 DIAGNOSIS — E785 Hyperlipidemia, unspecified: Secondary | ICD-10-CM | POA: Diagnosis not present

## 2018-05-18 DIAGNOSIS — M4620 Osteomyelitis of vertebra, site unspecified: Secondary | ICD-10-CM | POA: Diagnosis not present

## 2018-05-18 LAB — CBC WITH DIFFERENTIAL/PLATELET
Basophils Absolute: 0 10*3/uL (ref 0.0–0.1)
Basophils Relative: 0.7 % (ref 0.0–3.0)
EOS ABS: 0.1 10*3/uL (ref 0.0–0.7)
Eosinophils Relative: 1.4 % (ref 0.0–5.0)
HEMATOCRIT: 45.4 % (ref 39.0–52.0)
Hemoglobin: 15.2 g/dL (ref 13.0–17.0)
LYMPHS PCT: 40 % (ref 12.0–46.0)
Lymphs Abs: 1.6 10*3/uL (ref 0.7–4.0)
MCHC: 33.4 g/dL (ref 30.0–36.0)
MCV: 94.5 fl (ref 78.0–100.0)
MONOS PCT: 12.3 % — AB (ref 3.0–12.0)
Monocytes Absolute: 0.5 10*3/uL (ref 0.1–1.0)
NEUTROS ABS: 1.8 10*3/uL (ref 1.4–7.7)
Neutrophils Relative %: 45.6 % (ref 43.0–77.0)
PLATELETS: 175 10*3/uL (ref 150.0–400.0)
RBC: 4.8 Mil/uL (ref 4.22–5.81)
RDW: 13.1 % (ref 11.5–15.5)
WBC: 4 10*3/uL (ref 4.0–10.5)

## 2018-05-18 LAB — BASIC METABOLIC PANEL
BUN: 17 mg/dL (ref 6–23)
CHLORIDE: 105 meq/L (ref 96–112)
CO2: 29 mEq/L (ref 19–32)
Calcium: 9.9 mg/dL (ref 8.4–10.5)
Creatinine, Ser: 0.96 mg/dL (ref 0.40–1.50)
GFR: 98.96 mL/min (ref >60.00)
GLUCOSE: 143 mg/dL — AB (ref 70–99)
POTASSIUM: 4.1 meq/L (ref 3.5–5.1)
SODIUM: 141 meq/L (ref 135–145)

## 2018-05-18 LAB — VITAMIN D 25 HYDROXY (VIT D DEFICIENCY, FRACTURES): VITD: 37.46 ng/mL (ref 30.00–100.00)

## 2018-05-18 MED ORDER — AZILSARTAN-CHLORTHALIDONE 40-12.5 MG PO TABS: 1.0000 | ORAL_TABLET | Freq: Every day | ORAL | 0 refills | Status: DC

## 2018-05-18 MED ORDER — CHOLECALCIFEROL 50 MCG (2000 UT) PO TABS: 1.0000 | ORAL_TABLET | Freq: Every day | ORAL | 1 refills | Status: DC

## 2018-05-18 MED ORDER — SIMVASTATIN 40 MG PO TABS: 40.0000 mg | ORAL_TABLET | Freq: Every day | ORAL | 1 refills | Status: DC

## 2018-05-18 MED ORDER — DAPAGLIFLOZIN PROPANEDIOL 10 MG PO TABS: 10.0000 mg | ORAL_TABLET | Freq: Every day | ORAL | 1 refills | Status: DC

## 2018-05-18 NOTE — Patient Instructions (Signed)

## 2018-05-18 NOTE — Progress Notes (Signed)
Subjective:  Patient ID: Charles Parker, male    DOB: [birth year]  Age: 72 y.o. MRN: [id number]  CC: Hypertension and Diabetes   HPI AERIK POLAN presents for f/up - He complains of lower extremity edema and that his blood pressure is not well controlled.  He denies any recent episodes of headache, CP, DOE, palpitations, or fatigue.  Outpatient Medications Prior to Visit  Medication Sig Dispense Refill  . Alcohol Swabs PADS Use to clean area before insulin injections. DX E11.8 200 each 3  . ferrous sulfate 325 (65 FE) MG tablet Take 1 tablet (325 mg total) by mouth 2 (two) times daily with a meal. 180 tablet 1  . gabapentin (NEURONTIN) 300 MG capsule TAKE 1 CAPSULE(300 MG) BY MOUTH TWICE DAILY 180 capsule 1  . glucose blood (ACCU-CHEK GUIDE) test strip 1 each by Other route 3 (three) times daily. Use as instructed 100 each 12  . insulin aspart (NOVOLOG FLEXPEN) 100 UNIT/ML FlexPen Inject 10 Units into the skin 3 (three) times daily with meals. 45 mL 1  . Insulin Degludec (TRESIBA FLEXTOUCH) 200 UNIT/ML SOPN Inject 38 Units into the skin daily before breakfast. 3 pen 1  . Insulin Pen Needle (EASY TOUCH PEN NEEDLES) 31G X 5 MM MISC ADMINISTER LANTUS ONCE EVERY EVENING 100 each 3  . Multiple Vitamin (MULTIVITAMIN WITH MINERALS) TABS tablet Take 1 tablet by mouth daily.    Marland Kitchen oxybutynin (DITROPAN) 5 MG tablet TAKE 1 TABLET EVERY DAY (SUBSTITUTED FOR DITROPAN) 90 tablet 1  . rivaroxaban (XARELTO) 20 MG TABS tablet Take 1 tablet (20 mg total) by mouth daily with supper. 90 tablet 1  . tamsulosin (FLOMAX) 0.4 MG CAPS capsule TAKE 1 CAPSULE(0.4 MG) BY MOUTH DAILY AFTER BREAKFAST 90 capsule 1  . TRUEPLUS LANCETS 33G MISC USE TO CHECK BLOOD SUGARS THREE TIMES DAY 300 each 1  . dapagliflozin propanediol (FARXIGA) 10 MG TABS tablet Take 10 mg by mouth daily. 90 tablet 0  . simvastatin (ZOCOR) 40 MG tablet Take 1 tablet (40 mg total) by mouth at bedtime. 90 tablet 0   No facility-administered  medications prior to visit.     ROS Review of Systems  Constitutional: Negative for diaphoresis and fatigue.  HENT: Negative.   Eyes: Negative for visual disturbance.  Respiratory: Negative for cough, chest tightness, shortness of breath and wheezing.   Cardiovascular: Positive for leg swelling. Negative for chest pain and palpitations.  Gastrointestinal: Negative for abdominal pain, constipation, diarrhea, nausea and vomiting.  Endocrine: Negative for polydipsia, polyphagia and polyuria.  Genitourinary: Negative.  Negative for difficulty urinating and dysuria.  Musculoskeletal: Negative for arthralgias, back pain and myalgias.  Skin: Negative for color change, pallor and rash.  Neurological: Negative.  Negative for dizziness, weakness and light-headedness.  Hematological: Negative for adenopathy. Does not bruise/bleed easily.  Psychiatric/Behavioral: Negative.     Objective:  BP (!) 152/98 (BP Location: Left Arm, Patient Position: Sitting, Cuff Size: Normal)   Pulse 80   Temp 97.9 F (36.6 C) (Oral)   Resp 16   Ht 5\' 11"  (1.803 m)   Wt 203 lb (92.1 kg)   SpO2 95%   BMI 28.31 kg/m   BP Readings from Last 3 Encounters:  05/18/18 (!) 152/98  05/11/18 130/70  05/08/18 140/70    Wt Readings from Last 3 Encounters:  05/18/18 203 lb (92.1 kg)  05/11/18 203 lb 6.4 oz (92.3 kg)  05/08/18 203 lb 6.4 oz (92.3 kg)    Physical Exam  Constitutional:  He is oriented to person, place, and time. No distress.  HENT:  Mouth/Throat: Oropharynx is clear and moist. No oropharyngeal exudate.  Eyes: Conjunctivae are normal. No scleral icterus.  Neck: Normal range of motion. Neck supple. No JVD present. No thyromegaly present.  Cardiovascular: Normal rate, regular rhythm and normal heart sounds. Exam reveals no gallop and no friction rub.  No murmur heard. Pulmonary/Chest: Effort normal and breath sounds normal. No respiratory distress. He has no wheezes. He has no rhonchi. He has no  rales.  Abdominal: Soft. Normal appearance and bowel sounds are normal. He exhibits no mass. There is no hepatosplenomegaly. There is no tenderness.  Musculoskeletal: Normal range of motion. He exhibits edema (1+ pitting edema in BLE). He exhibits no tenderness or deformity.  Lymphadenopathy:    He has no cervical adenopathy.  Neurological: He is alert and oriented to person, place, and time.  Skin: Skin is warm and dry. No rash noted. He is not diaphoretic.  Vitals reviewed.   Lab Results  Component Value Date   WBC 4.0 05/18/2018   HGB 15.2 05/18/2018   HCT 45.4 05/18/2018   PLT 175.0 05/18/2018   GLUCOSE 143 (H) 05/18/2018   CHOL 165 03/03/2018   TRIG 172.0 (H) 03/03/2018   HDL 39.20 03/03/2018   LDLDIRECT 118.0 11/11/2015   LDLCALC 91 03/03/2018   ALT 24 03/03/2018   AST 18 03/03/2018   NA 141 05/18/2018   K 4.1 05/18/2018   CL 105 05/18/2018   CREATININE 0.96 05/18/2018   BUN 17 05/18/2018   CO2 29 05/18/2018   TSH 0.67 09/08/2016   PSA 1.49 08/17/2016   INR 1.37 09/26/2016   HGBA1C 8.2 (H) 03/03/2018   MICROALBUR <0.7 03/03/2018    No results found.  Assessment & Plan:   Izyk was seen today for hypertension and diabetes.  Diagnoses and all orders for this visit:  Iron deficiency-his H&H are normal now. 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  I have asked him to start an ARB for renal protection. -     simvastatin (ZOCOR) 40 MG tablet; Take 1 tablet (40 mg total) by mouth at bedtime. -     Azilsartan-Chlorthalidone (EDARBYCLOR) 40-12.5 MG TABS; Take 1 tablet by mouth daily. -     Basic metabolic panel; Future  Hyperlipidemia with target LDL less than 70- He is doing well on the statin. -     simvastatin (ZOCOR) 40 MG tablet; Take 1 tablet (40 mg total) by mouth at bedtime.  Type 2 diabetes mellitus with  complication, with long-term current use of insulin (HCC) -     Azilsartan-Chlorthalidone (EDARBYCLOR) 40-12.5 MG TABS; Take 1 tablet by mouth daily. -     Basic metabolic panel; Future  Essential hypertension - His blood pressure is not adequately well controlled and he has peripheral edema.  Will start an ARB and a thiazide diuretic.  I will also treat the vitamin D insufficiency. -     Azilsartan-Chlorthalidone (EDARBYCLOR) 40-12.5 MG TABS; Take 1 tablet by mouth daily. -     Basic metabolic panel; Future -     VITAMIN D 25 Hydroxy (Vit-D Deficiency, Fractures); Future  Vitamin D insufficiency -     Cholecalciferol 2000 units TABS; Take 1 tablet (2,000 Units total) by mouth daily.  Other orders -     dapagliflozin propanediol (FARXIGA) 10 MG TABS  tablet; Take 10 mg by mouth daily.   I am having Cassie Henkels. Sulak "A.D." start on Azilsartan-Chlorthalidone and Cholecalciferol. I am also having him maintain his ferrous sulfate, tamsulosin, rivaroxaban, insulin aspart, TRUEPLUS LANCETS 33G, Alcohol Swabs, Insulin Degludec, multivitamin with minerals, glucose blood, oxybutynin, Insulin Pen Needle, gabapentin, dapagliflozin propanediol, and simvastatin.  Meds ordered this encounter  Medications  . dapagliflozin propanediol (FARXIGA) 10 MG TABS tablet    Sig: Take 10 mg by mouth daily.    Dispense:  90 tablet    Refill:  1  . simvastatin (ZOCOR) 40 MG tablet    Sig: Take 1 tablet (40 mg total) by mouth at bedtime.    Dispense:  90 tablet    Refill:  1  . Azilsartan-Chlorthalidone (EDARBYCLOR) 40-12.5 MG TABS    Sig: Take 1 tablet by mouth daily.    Dispense:  21 tablet    Refill:  0  . Cholecalciferol 2000 units TABS    Sig: Take 1 tablet (2,000 Units total) by mouth daily.    Dispense:  90 tablet    Refill:  1     Follow-up: Return in about 3 weeks (around 06/08/2018).  Scarlette Calico, MD

## 2018-05-19 DIAGNOSIS — M4620 Osteomyelitis of vertebra, site unspecified: Secondary | ICD-10-CM | POA: Diagnosis not present

## 2018-05-20 DIAGNOSIS — M4620 Osteomyelitis of vertebra, site unspecified: Secondary | ICD-10-CM | POA: Diagnosis not present

## 2018-05-22 DIAGNOSIS — M4620 Osteomyelitis of vertebra, site unspecified: Secondary | ICD-10-CM | POA: Diagnosis not present

## 2018-05-23 DIAGNOSIS — M4620 Osteomyelitis of vertebra, site unspecified: Secondary | ICD-10-CM | POA: Diagnosis not present

## 2018-05-24 ENCOUNTER — Encounter: Payer: Medicare HMO | Admitting: Nutrition

## 2018-05-24 DIAGNOSIS — M4620 Osteomyelitis of vertebra, site unspecified: Secondary | ICD-10-CM | POA: Diagnosis not present

## 2018-05-25 DIAGNOSIS — M4620 Osteomyelitis of vertebra, site unspecified: Secondary | ICD-10-CM | POA: Diagnosis not present

## 2018-05-26 DIAGNOSIS — M4620 Osteomyelitis of vertebra, site unspecified: Secondary | ICD-10-CM | POA: Diagnosis not present

## 2018-05-27 DIAGNOSIS — M4620 Osteomyelitis of vertebra, site unspecified: Secondary | ICD-10-CM | POA: Diagnosis not present

## 2018-05-29 DIAGNOSIS — M4620 Osteomyelitis of vertebra, site unspecified: Secondary | ICD-10-CM | POA: Diagnosis not present

## 2018-05-30 DIAGNOSIS — M4620 Osteomyelitis of vertebra, site unspecified: Secondary | ICD-10-CM | POA: Diagnosis not present

## 2018-05-31 DIAGNOSIS — M4620 Osteomyelitis of vertebra, site unspecified: Secondary | ICD-10-CM | POA: Diagnosis not present

## 2018-06-01 DIAGNOSIS — M4620 Osteomyelitis of vertebra, site unspecified: Secondary | ICD-10-CM | POA: Diagnosis not present

## 2018-06-02 DIAGNOSIS — M4620 Osteomyelitis of vertebra, site unspecified: Secondary | ICD-10-CM | POA: Diagnosis not present

## 2018-06-03 DIAGNOSIS — M4620 Osteomyelitis of vertebra, site unspecified: Secondary | ICD-10-CM | POA: Diagnosis not present

## 2018-06-04 DIAGNOSIS — M4620 Osteomyelitis of vertebra, site unspecified: Secondary | ICD-10-CM | POA: Diagnosis not present

## 2018-06-06 DIAGNOSIS — M4620 Osteomyelitis of vertebra, site unspecified: Secondary | ICD-10-CM | POA: Diagnosis not present

## 2018-06-08 DIAGNOSIS — M4620 Osteomyelitis of vertebra, site unspecified: Secondary | ICD-10-CM | POA: Diagnosis not present

## 2018-06-09 DIAGNOSIS — M4620 Osteomyelitis of vertebra, site unspecified: Secondary | ICD-10-CM | POA: Diagnosis not present

## 2018-06-10 DIAGNOSIS — M4620 Osteomyelitis of vertebra, site unspecified: Secondary | ICD-10-CM | POA: Diagnosis not present

## 2018-06-18 DIAGNOSIS — M4620 Osteomyelitis of vertebra, site unspecified: Secondary | ICD-10-CM | POA: Diagnosis not present

## 2018-06-19 DIAGNOSIS — M4620 Osteomyelitis of vertebra, site unspecified: Secondary | ICD-10-CM | POA: Diagnosis not present

## 2018-06-20 DIAGNOSIS — M4620 Osteomyelitis of vertebra, site unspecified: Secondary | ICD-10-CM | POA: Diagnosis not present

## 2018-06-21 DIAGNOSIS — M4620 Osteomyelitis of vertebra, site unspecified: Secondary | ICD-10-CM | POA: Diagnosis not present

## 2018-06-22 DIAGNOSIS — M4620 Osteomyelitis of vertebra, site unspecified: Secondary | ICD-10-CM | POA: Diagnosis not present

## 2018-06-23 DIAGNOSIS — M4620 Osteomyelitis of vertebra, site unspecified: Secondary | ICD-10-CM | POA: Diagnosis not present

## 2018-06-24 DIAGNOSIS — M4620 Osteomyelitis of vertebra, site unspecified: Secondary | ICD-10-CM | POA: Diagnosis not present

## 2018-06-25 DIAGNOSIS — M4620 Osteomyelitis of vertebra, site unspecified: Secondary | ICD-10-CM | POA: Diagnosis not present

## 2018-06-26 DIAGNOSIS — M4620 Osteomyelitis of vertebra, site unspecified: Secondary | ICD-10-CM | POA: Diagnosis not present

## 2018-06-27 DIAGNOSIS — M4620 Osteomyelitis of vertebra, site unspecified: Secondary | ICD-10-CM | POA: Diagnosis not present

## 2018-06-28 ENCOUNTER — Other Ambulatory Visit: Payer: Self-pay | Admitting: Internal Medicine

## 2018-06-28 DIAGNOSIS — E113319 Type 2 diabetes mellitus with moderate nonproliferative diabetic retinopathy with macular edema, unspecified eye: Secondary | ICD-10-CM

## 2018-06-28 DIAGNOSIS — E785 Hyperlipidemia, unspecified: Secondary | ICD-10-CM

## 2018-06-28 DIAGNOSIS — M4620 Osteomyelitis of vertebra, site unspecified: Secondary | ICD-10-CM | POA: Diagnosis not present

## 2018-06-29 DIAGNOSIS — M4620 Osteomyelitis of vertebra, site unspecified: Secondary | ICD-10-CM | POA: Diagnosis not present

## 2018-06-30 DIAGNOSIS — M4620 Osteomyelitis of vertebra, site unspecified: Secondary | ICD-10-CM | POA: Diagnosis not present

## 2018-07-01 DIAGNOSIS — M4620 Osteomyelitis of vertebra, site unspecified: Secondary | ICD-10-CM | POA: Diagnosis not present

## 2018-07-03 DIAGNOSIS — M4620 Osteomyelitis of vertebra, site unspecified: Secondary | ICD-10-CM | POA: Diagnosis not present

## 2018-07-04 DIAGNOSIS — M4620 Osteomyelitis of vertebra, site unspecified: Secondary | ICD-10-CM | POA: Diagnosis not present

## 2018-07-05 DIAGNOSIS — M4620 Osteomyelitis of vertebra, site unspecified: Secondary | ICD-10-CM | POA: Diagnosis not present

## 2018-07-06 ENCOUNTER — Other Ambulatory Visit: Payer: Self-pay | Admitting: Internal Medicine

## 2018-07-06 DIAGNOSIS — M4620 Osteomyelitis of vertebra, site unspecified: Secondary | ICD-10-CM | POA: Diagnosis not present

## 2018-07-06 DIAGNOSIS — E611 Iron deficiency: Secondary | ICD-10-CM

## 2018-07-06 DIAGNOSIS — I824Y1 Acute embolism and thrombosis of unspecified deep veins of right proximal lower extremity: Secondary | ICD-10-CM

## 2018-07-06 DIAGNOSIS — E1140 Type 2 diabetes mellitus with diabetic neuropathy, unspecified: Secondary | ICD-10-CM

## 2018-07-06 DIAGNOSIS — N3281 Overactive bladder: Secondary | ICD-10-CM

## 2018-07-06 DIAGNOSIS — R3916 Straining to void: Secondary | ICD-10-CM

## 2018-07-06 DIAGNOSIS — N401 Enlarged prostate with lower urinary tract symptoms: Secondary | ICD-10-CM

## 2018-07-06 MED ORDER — TAMSULOSIN HCL 0.4 MG PO CAPS: ORAL_CAPSULE | ORAL | 1 refills | Status: DC

## 2018-07-06 MED ORDER — OXYBUTYNIN CHLORIDE 5 MG PO TABS: ORAL_TABLET | ORAL | 1 refills | Status: DC

## 2018-07-06 MED ORDER — GABAPENTIN 300 MG PO CAPS: ORAL_CAPSULE | ORAL | 1 refills | Status: DC

## 2018-07-06 MED ORDER — RIVAROXABAN 20 MG PO TABS: 20.0000 mg | ORAL_TABLET | Freq: Every day | ORAL | 1 refills | Status: DC

## 2018-07-06 MED ORDER — FERROUS SULFATE 325 (65 FE) MG PO TABS: 325.0000 mg | ORAL_TABLET | Freq: Two times a day (BID) | ORAL | 1 refills | Status: DC

## 2018-07-06 NOTE — Telephone Encounter (Signed)
Copied from [place] [id number]. Topic: Quick Communication - See Telephone Encounter >> Jul 06, 2018  9:39 AM Ahmed Prima L wrote: CRM for notification. See Telephone encounter for: 07/06/18.  Patient's brother, [name] called and said that he has been out of town for the last few weeks and he went by to check on Charles Parker and he was completely out of all his medications. He said that he thinks these are all that he needs but will call back later to add anymore after he goes back to his house. He said that he thought that all these would come in the mail with [facility]. He wants these sent to a local pharmacy in the mean time.   rivaroxaban (XARELTO) 20 MG TABS tablet ferrous sulfate 325 (65 FE) MG tablet gabapentin (NEURONTIN) 300 MG capsule tamsulosin (FLOMAX) 0.4 MG CAPS capsule oxybutynin (DITROPAN) 5 MG tablet  [id number] - [place], [place] - [address] AT [place]

## 2018-07-06 NOTE — Telephone Encounter (Signed)
Requested Prescriptions  Pending Prescriptions Disp Refills  . ferrous sulfate 325 (65 FE) MG tablet 180 tablet 1    Sig: Take 1 tablet (325 mg total) by mouth 2 (two) times daily with a meal.     Endocrinology:  Minerals - Iron Supplementation Failed - 07/06/2018 10:10 AM      Failed - Fe (serum) in normal range and within 360 days    Iron  Date Value Ref Range Status  12/15/2016 28 (Parker) 42 - 165 ug/dL Final   Saturation Ratios  Date Value Ref Range Status  12/15/2016 7.0 (Parker) 20.0 - 50.0 % Final         Failed - Ferritin in normal range and within 360 days    Ferritin  Date Value Ref Range Status  12/15/2016 642.2 (H) 22.0 - 322.0 ng/mL Final         Passed - HGB in normal range and within 360 days    Hemoglobin  Date Value Ref Range Status  05/18/2018 15.2 13.0 - 17.0 g/dL Final         Passed - HCT in normal range and within 360 days    HCT  Date Value Ref Range Status  05/18/2018 45.4 39.0 - 52.0 % Final         Passed - RBC in normal range and within 360 days    RBC  Date Value Ref Range Status  05/18/2018 4.80 4.22 - 5.81 Mil/uL Final         Passed - Valid encounter within last 12 months    Recent Outpatient Visits          1 month ago Iron deficiency   [place], Charles L, MD   11 months ago Primary osteoarthritis of left knee   [place], Charles L, MD   1 year ago Flu vaccine need   [place], Charles L, MD   1 year ago Primary osteoarthritis of left knee   [place], [place], DO   1 year ago Type 2 diabetes mellitus with complication, with long-term current use of insulin ([place])   [facility] -Mayer Camel, MD           . gabapentin (NEURONTIN) 300 MG capsule 180 capsule 1    Sig: TAKE 1 CAPSULE(300 MG) BY MOUTH TWICE DAILY     Neurology: Anticonvulsants - gabapentin Passed - 07/06/2018  10:10 AM      Passed - Valid encounter within last 12 months    Recent Outpatient Visits          1 month ago Iron deficiency   [place], Charles L, MD   11 months ago Primary osteoarthritis of left knee   [place], Charles L, MD   1 year ago Flu vaccine need   [place], Charles L, MD   1 year ago Primary osteoarthritis of left knee   [place], [place], DO   1 year ago Type 2 diabetes mellitus with complication, with long-term current use of insulin ([place])   [facility] -Mayer Camel, MD           . oxybutynin (DITROPAN) 5 MG tablet 90 tablet 1    Sig: TAKE 1 TABLET EVERY DAY (SUBSTITUTED FOR [place])  Urology:  Bladder Agents Passed - 07/06/2018 10:10 AM      Passed - Valid encounter within last 12 months    Recent Outpatient Visits          1 month ago Iron deficiency   [place], MD   11 months ago Primary osteoarthritis of left knee   [place], Charles L, MD   1 year ago Flu vaccine need   [place], Charles L, MD   1 year ago Primary osteoarthritis of left knee   [place], Olevia Bowens, DO   1 year ago Type 2 diabetes mellitus with complication, with long-term current use of insulin [facility])   [facility] -Mayer Camel, MD           . rivaroxaban (XARELTO) 20 MG TABS tablet 90 tablet 1    Sig: Take 1 tablet (20 mg total) by mouth daily with supper.     Hematology: Anticoagulants - rivaroxaban Passed - 07/06/2018 10:10 AM      Passed - ALT in normal range and within 180 days    ALT  Date Value Ref Range Status  03/03/2018 24 0 - 53 U/[place] Final         Passed - AST in normal range and within 180 days    AST  Date Value Ref Range  Status  03/03/2018 18 0 - 37 U/[place] Final         Passed - Cr in normal range and within 360 days    Creat  Date Value Ref Range Status  03/24/2017 1.04 0.70 - 1.18 mg/dL Final    Comment:      For patients > or = 72 years of age: The upper reference limit for Creatinine is approximately 13% higher for people identified as African-American.      Creatinine, Ser  Date Value Ref Range Status  05/18/2018 0.96 0.40 - 1.50 mg/dL Final         Passed - HCT in normal range and within 360 days    HCT  Date Value Ref Range Status  05/18/2018 45.4 39.0 - 52.0 % Final         Passed - HGB in normal range and within 360 days    Hemoglobin  Date Value Ref Range Status  05/18/2018 15.2 13.0 - 17.0 g/dL Final         Passed - PLT in normal range and within 360 days    Platelets  Date Value Ref Range Status  05/18/2018 175.0 150.0 - 400.0 K/uL Final         Passed - Valid encounter within last 12 months    Recent Outpatient Visits          1 month ago Iron deficiency   [place], MD   11 months ago Primary osteoarthritis of left knee   [place], Charles L, MD   1 year ago Flu vaccine need   [place], Charles L, MD   1 year ago Primary osteoarthritis of left knee   [place], Olevia Bowens, DO   1 year ago Type 2 diabetes mellitus with complication, with long-term current use of insulin [facility])   [facility] -Mayer Camel, MD           .  tamsulosin (FLOMAX) 0.4 MG CAPS capsule 90 capsule 1    Sig: TAKE 1 CAPSULE(0.4 MG) BY MOUTH DAILY AFTER BREAKFAST     Urology: Alpha-Adrenergic Blocker Failed - 07/06/2018 10:10 AM      Failed - Last BP in normal range    BP Readings from Last 1 Encounters:  05/18/18 (!) 152/98         Passed - Valid encounter within last 12 months    Recent Outpatient Visits           1 month ago Iron deficiency   [place], MD   11 months ago Primary osteoarthritis of left knee   [place], Charles L, MD   1 year ago Flu vaccine need   [place], Charles L, MD   1 year ago Primary osteoarthritis of left knee   [place], Olevia Bowens, DO   1 year ago Type 2 diabetes mellitus with complication, with long-term current use of insulin [facility])   [facility] -Mayer Camel, MD

## 2018-07-07 ENCOUNTER — Other Ambulatory Visit (INDEPENDENT_AMBULATORY_CARE_PROVIDER_SITE_OTHER): Payer: Medicare HMO

## 2018-07-07 DIAGNOSIS — Z794 Long term (current) use of insulin: Secondary | ICD-10-CM | POA: Diagnosis not present

## 2018-07-07 DIAGNOSIS — E1165 Type 2 diabetes mellitus with hyperglycemia: Secondary | ICD-10-CM

## 2018-07-07 LAB — COMPREHENSIVE METABOLIC PANEL
ALT: 20 U/L (ref 0–53)
AST: 18 U/L (ref 0–37)
Albumin: 3.9 g/dL (ref 3.5–5.2)
Alkaline Phosphatase: 50 U/L (ref 39–117)
BUN: 16 mg/dL (ref 6–23)
CALCIUM: 9.2 mg/dL (ref 8.4–10.5)
CHLORIDE: 108 meq/L (ref 96–112)
CO2: 29 meq/L (ref 19–32)
CREATININE: 1.05 mg/dL (ref 0.40–1.50)
GFR: 89.2 mL/min (ref >60.00)
Glucose, Bld: 159 mg/dL — ABNORMAL HIGH (ref 70–99)
Potassium: 4 mEq/L (ref 3.5–5.1)
SODIUM: 141 meq/L (ref 135–145)
Total Bilirubin: 0.6 mg/dL (ref 0.2–1.2)
Total Protein: 6.8 g/dL (ref 6.0–8.3)

## 2018-07-07 LAB — HEMOGLOBIN A1C: HEMOGLOBIN A1C: 7.2 % — AB (ref 4.6–6.5)

## 2018-07-08 DIAGNOSIS — M4620 Osteomyelitis of vertebra, site unspecified: Secondary | ICD-10-CM | POA: Diagnosis not present

## 2018-07-09 DIAGNOSIS — M4620 Osteomyelitis of vertebra, site unspecified: Secondary | ICD-10-CM | POA: Diagnosis not present

## 2018-07-10 DIAGNOSIS — M4620 Osteomyelitis of vertebra, site unspecified: Secondary | ICD-10-CM | POA: Diagnosis not present

## 2018-07-11 ENCOUNTER — Telehealth: Payer: Self-pay | Admitting: Endocrinology

## 2018-07-11 ENCOUNTER — Ambulatory Visit (INDEPENDENT_AMBULATORY_CARE_PROVIDER_SITE_OTHER): Payer: Medicare HMO | Admitting: Endocrinology

## 2018-07-11 ENCOUNTER — Encounter: Payer: Self-pay | Admitting: Endocrinology

## 2018-07-11 VITALS — BP 140/92 | HR 73 | Ht 71.0 in | Wt 203.0 lb

## 2018-07-11 DIAGNOSIS — I10 Essential (primary) hypertension: Secondary | ICD-10-CM | POA: Diagnosis not present

## 2018-07-11 DIAGNOSIS — Z794 Long term (current) use of insulin: Secondary | ICD-10-CM | POA: Diagnosis not present

## 2018-07-11 DIAGNOSIS — E1165 Type 2 diabetes mellitus with hyperglycemia: Secondary | ICD-10-CM

## 2018-07-11 DIAGNOSIS — M4620 Osteomyelitis of vertebra, site unspecified: Secondary | ICD-10-CM | POA: Diagnosis not present

## 2018-07-11 NOTE — Patient Instructions (Addendum)
Check blood sugars on waking up days a week  Also check blood sugars about 2 hours after meals and do this after different meals by rotation  Recommended blood sugar levels on waking up are 90-130 and about 2 hours after meal is 130-160  Please bring your blood sugar monitor to each visit, thank you  Take Novolog with u when eating out

## 2018-07-11 NOTE — Telephone Encounter (Signed)
error 

## 2018-07-11 NOTE — Telephone Encounter (Signed)
I believe he was referring to the NovoLog insulin dose and I will document in my note

## 2018-07-11 NOTE — Progress Notes (Signed)
Patient ID: Charles Parker, male   DOB: 1946-02-09, 72 y.o.   MRN: [id number]          Reason for Appointment: Follow-up for Type 2 Diabetes  Referring physician: Scarlette Calico   History of Present Illness:          Date of diagnosis of type 2 diabetes mellitus: 2014        Background history:   He has been on various oral hypoglycemic drugs since the onset including metformin, Januvia and Amaryl Was also on Invokana in 2015 for about a year but not clear why this was stopped His level of control has been quite variable with highest A1c 13.6 in 06/2015  He has been on insulin since about 10/2014 as documented in his record  Recent history:   INSULIN regimen is:  [place] 32  units at suppertime, NovoLog 10 units before each meal  Non-insulin hypoglycemic drugs the patient is taking GHW:EXHBZJI 10 mg daily  His most recent A1c was 8.2 and is now 7.2   Current management, blood sugar patterns and problems identified:  He is still very confused about how much insulin he is taking and confuses the 2 types of insulin  Although he thinks he is taking his NovoLog with the orange pen with his meals he thinks he is taking 30 units  Does not keep a record of his insulin  Currently with his Tyler Aas his fasting readings appear to be fairly good but he has checked fasting readings only 3 times in the last month  Also forgets to check his blood sugar later in the day despite reminders, previously was giving her diarrhea but not now  He will take his NovoLog after he comes back from a restaurant, usually eating out in the evening  No hypoglycemia by history   Side effects from medications have been: Hives from metformin  Compliance with the medical regimen: Inconsistent  Glucose monitoring:  done< 1 times a   day usually        Glucometer: Accu-Chek guide      Blood Glucose readings by : Download  FASTING range 111-134 with only 3 readings, no other readings present  Self-care:  The diet that the patient has been following is: tries to limit [employer] .      Typical meal intake: Breakfast is eggs/cereal or oatmeal usually, lunch: Cold cut sandwiches.  Dinner chicken and vegetables.   Snacks will be chips, peanut butter crackers Supper at 6 pm                Dietician visit, most recent:3/19               Exercise: some walking around his apartment area and is not able to do much because of leg weakness   Weight history:  Wt Readings from Last 3 Encounters:  07/11/18 203 lb (92.1 kg)  05/18/18 203 lb (92.1 kg)  05/11/18 203 lb 6.4 oz (92.3 kg)    Glycemic control:   Lab Results  Component Value Date   HGBA1C 7.2 (H) 07/07/2018   HGBA1C 8.2 (H) 03/03/2018   HGBA1C 8.7 (H) 11/18/2017   Lab Results  Component Value Date   MICROALBUR <0.7 03/03/2018   LDLCALC 91 03/03/2018   CREATININE 1.05 07/07/2018   Lab Results  Component Value Date   MICRALBCREAT 1.4 03/03/2018    Lab Results  Component Value Date   FRUCTOSAMINE 330 (H) 05/05/2018   FRUCTOSAMINE 331 (H) 12/29/2017  FRUCTOSAMINE 334 (H) 08/05/2017      Allergies as of 07/11/2018      Reactions   Metformin And Related Diarrhea   Codeine Rash   All over the body      Medication List        Accurate as of 07/11/18  1:22 PM. Always use your most recent med list.          Alcohol Swabs Pads Use to clean area before insulin injections. DX E11.8   Azilsartan-Chlorthalidone 40-12.5 MG Tabs Take 1 tablet by mouth daily.   Cholecalciferol 2000 units Tabs Take 1 tablet (2,000 Units total) by mouth daily.   dapagliflozin propanediol 10 MG Tabs tablet Commonly known as:  FARXIGA Take 10 mg by mouth daily.   FARXIGA 10 MG Tabs tablet Generic drug:  dapagliflozin propanediol TAKE 1 TABLET BY MOUTH DAILY   ferrous sulfate 325 (65 FE) MG tablet Take 1 tablet (325 mg total) by mouth 2 (two) times daily with a meal.   gabapentin 300 MG capsule Commonly known as:  NEURONTIN TAKE  1 CAPSULE(300 MG) BY MOUTH TWICE DAILY   glucose blood test strip 1 each by Other route 3 (three) times daily. Use as instructed   insulin aspart 100 UNIT/ML FlexPen Commonly known as:  NOVOLOG Inject 10 Units into the skin 3 (three) times daily with meals.   Insulin Degludec 200 UNIT/ML Sopn Inject 38 Units into the skin daily before breakfast.   Insulin Pen Needle 31G X 5 MM Misc ADMINISTER LANTUS ONCE EVERY EVENING   multivitamin with minerals Tabs tablet Take 1 tablet by mouth daily.   oxybutynin 5 MG tablet Commonly known as:  DITROPAN TAKE 1 TABLET EVERY DAY (SUBSTITUTED FOR DITROPAN)   rivaroxaban 20 MG Tabs tablet Commonly known as:  XARELTO Take 1 tablet (20 mg total) by mouth daily with supper.   simvastatin 40 MG tablet Commonly known as:  ZOCOR Take 1 tablet (40 mg total) by mouth at bedtime.   simvastatin 40 MG tablet Commonly known as:  ZOCOR TAKE 1 TABLET(40 MG) BY MOUTH AT BEDTIME   tamsulosin 0.4 MG Caps capsule Commonly known as:  FLOMAX TAKE 1 CAPSULE(0.4 MG) BY MOUTH DAILY AFTER BREAKFAST   TRUEPLUS LANCETS 33G Misc USE TO CHECK BLOOD SUGARS THREE TIMES DAY       Allergies:  Allergies  Allergen Reactions  . Metformin And Related Diarrhea  . Codeine Rash    All over the body    Past Medical History:  Diagnosis Date  . Clotting disorder (HCC)    DVT  . Diabetes mellitus    type II  . Diverticulosis   . DVT (deep venous thrombosis) ([place])   . Glaucoma    no eye drops   . Hepatic cyst   . Hypercholesterolemia   . Hypertension   . Renal cyst   . Sleep apnea   . Tubulovillous adenoma     Past Surgical History:  Procedure Laterality Date  . BLADDER NECK RECONSTRUCTION  02/03/2012   Procedure: BLADDER NECK REPAIR;  Surgeon: Adin Hector, MD;  Location: [facility] ORS;  Service: General;;  . COLONOSCOPY  02/2013   polyp removal(mult.)  . KNEE SURGERY  2004   left  . POLYPECTOMY    . PROCTOSCOPY  02/03/2012   Procedure: PROCTOSCOPY;   Surgeon: Adin Hector, MD;  Location: [facility];  Service: General;;  . STOMACH SURGERY  02/2012  . TEE WITHOUT CARDIOVERSION N/A 10/05/2016   Procedure: TRANSESOPHAGEAL  ECHOCARDIOGRAM (TEE);  Surgeon: Sanda Klein, MD;  Location[facility] ENDOSCOPY;  Service: Cardiovascular;  Laterality: N/A;    Family History  Problem Relation Age of Onset  . Hypertension Mother   . Diabetes Mother   . Cancer Mother        ? location  . Colon cancer Father 44  . Colon polyps Neg Hx     Social History:  reports that he has never smoked. He has never used smokeless tobacco. He reports that he does not drink alcohol or use drugs.   Review of Systems   Lipid history: Has been taking Zocor 40 mg with good control, followed by PCP    Lab Results  Component Value Date   CHOL 165 03/03/2018   HDL 39.20 03/03/2018   LDLCALC 91 03/03/2018   LDLDIRECT 118.0 11/11/2015   TRIG 172.0 (H) 03/03/2018   CHOLHDL 4 03/03/2018            Most recent eye exam was In 03/2017  Most recent foot exam: 9/18  RENAL function is stable with using Farxiga 10 mg  Lab Results  Component Value Date   CREATININE 1.05 07/07/2018   BUN 16 07/07/2018   NA 141 07/07/2018   K 4.0 07/07/2018   CL 108 07/07/2018   CO2 29 07/07/2018   His blood pressure appears to be consistently high now He was recently started on ARB/chlorthalidone combination by PCP  BP Readings from Last 3 Encounters:  07/11/18 (!) 140/92  05/18/18 (!) 152/98  05/11/18 130/70     LABS:  Lab on 07/07/2018  Component Date Value Ref Range Status  . Sodium 07/07/2018 141  135 - 145 mEq/L Final  . Potassium 07/07/2018 4.0  3.5 - 5.1 mEq/L Final  . Chloride 07/07/2018 108  96 - 112 mEq/L Final  . CO2 07/07/2018 29  19 - 32 mEq/L Final  . Glucose, Bld 07/07/2018 159* 70 - 99 mg/dL Final  . BUN 07/07/2018 16  6 - 23 mg/dL Final  . Creatinine, Ser 07/07/2018 1.05  0.40 - 1.50 mg/dL Final  . Total Bilirubin 07/07/2018 0.6  0.2 - 1.2 mg/dL Final  .  Alkaline Phosphatase 07/07/2018 50  39 - 117 U/L Final  . AST 07/07/2018 18  0 - 37 U/L Final  . ALT 07/07/2018 20  0 - 53 U/L Final  . Total Protein 07/07/2018 6.8  6.0 - 8.3 g/dL Final  . Albumin 07/07/2018 3.9  3.5 - 5.2 g/dL Final  . Calcium 07/07/2018 9.2  8.4 - 10.5 mg/dL Final  . GFR 07/07/2018 89.20  >60.00 mL/min Final  . Hgb A1c MFr Bld 07/07/2018 7.2* 4.6 - 6.5 % Final   Glycemic Control Guidelines for People with Diabetes:Non Diabetic:  <6%Goal of Therapy: <7%Additional Action Suggested:  >8%     Physical Examination:  BP (!) 140/92   Pulse 73   Ht 5\' 11"  (1.803 m)   Wt 203 lb (92.1 kg)   SpO2 96%   BMI 28.31 kg/m          ASSESSMENT:  Diabetes type 2, recent BMI 28  See history of present illness for detailed discussion of current diabetes management, blood sugar patterns and problems identified   He is on a regimen of basal bolus insulin and Farxiga  His A1c is finally improved and now 7.2  Difficult to know what his blood sugar patterns are because of lack of glucose monitoring He probably is taking his insulin more consistently than before  as discussed  PLAN:    He will call back to let us know what dose of NovoLog he is taking, discussed that it is unlikely that he can take 30 units 3 times a day and not get hypoglycemic  He will need to take his NovoLog with him when he is going out to eat and take it right before his meal  No change in [place]  He will need to keep a diary or a flowsheet which was given today to keep up with his blood sugar testing and insulin doses  Needs to make follow-up appointment with PCP for his hypertension  Patient Instructions  Check blood sugars on waking up days a week  Also check blood sugars about 2 hours after meals and do this after different meals by rotation  Recommended blood sugar levels on waking up are 90-130 and about 2 hours after meal is 130-160  Please bring your blood sugar monitor to each visit,  thank you      ADDENDUM: Patient called the office back and stated that he is taking 10 units of NovoLog and not 30 as he thought   Elayne Snare 07/11/2018, 1:22 PM   Note: This office note was prepared with Dragon voice recognition system technology. Any transcriptional errors that result from this process are unintentional.

## 2018-07-11 NOTE — Telephone Encounter (Signed)
The patient wants you to know that you were right, he was wrong re: the setting being 10.

## 2018-07-12 ENCOUNTER — Ambulatory Visit: Payer: Medicare HMO | Admitting: Podiatry

## 2018-07-12 DIAGNOSIS — M4620 Osteomyelitis of vertebra, site unspecified: Secondary | ICD-10-CM | POA: Diagnosis not present

## 2018-07-13 DIAGNOSIS — M4620 Osteomyelitis of vertebra, site unspecified: Secondary | ICD-10-CM | POA: Diagnosis not present

## 2018-07-14 DIAGNOSIS — M4620 Osteomyelitis of vertebra, site unspecified: Secondary | ICD-10-CM | POA: Diagnosis not present

## 2018-07-17 ENCOUNTER — Ambulatory Visit (INDEPENDENT_AMBULATORY_CARE_PROVIDER_SITE_OTHER): Payer: Medicare HMO | Admitting: Podiatry

## 2018-07-17 ENCOUNTER — Encounter: Payer: Self-pay | Admitting: Podiatry

## 2018-07-17 DIAGNOSIS — M2042 Other hammer toe(s) (acquired), left foot: Secondary | ICD-10-CM | POA: Diagnosis not present

## 2018-07-17 DIAGNOSIS — M205X2 Other deformities of toe(s) (acquired), left foot: Secondary | ICD-10-CM

## 2018-07-17 DIAGNOSIS — M2041 Other hammer toe(s) (acquired), right foot: Secondary | ICD-10-CM | POA: Diagnosis not present

## 2018-07-17 DIAGNOSIS — M2011 Hallux valgus (acquired), right foot: Secondary | ICD-10-CM

## 2018-07-17 DIAGNOSIS — M4620 Osteomyelitis of vertebra, site unspecified: Secondary | ICD-10-CM | POA: Diagnosis not present

## 2018-07-17 DIAGNOSIS — E1151 Type 2 diabetes mellitus with diabetic peripheral angiopathy without gangrene: Secondary | ICD-10-CM | POA: Diagnosis not present

## 2018-07-17 DIAGNOSIS — B351 Tinea unguium: Secondary | ICD-10-CM

## 2018-07-17 DIAGNOSIS — M2012 Hallux valgus (acquired), left foot: Secondary | ICD-10-CM

## 2018-07-17 DIAGNOSIS — M205X1 Other deformities of toe(s) (acquired), right foot: Secondary | ICD-10-CM

## 2018-07-17 NOTE — Progress Notes (Signed)
Subjective:  Patient ID: Charles Parker, male    DOB: 1946-09-05,  MRN: [id number]  Chief Complaint  Patient presents with  . Nail Problem    trim    72 y.o. male presents  for diabetic foot care. Last AMBS was 140.Marland Kitchen Reports numbness and tingling in their feet. Denies cramping in legs and thighs.  Review of Systems: Negative except as noted in the HPI. Denies N/V/F/Ch.  Past Medical History:  Diagnosis Date  . Clotting disorder (HCC)    DVT  . Diabetes mellitus    type II  . Diverticulosis   . DVT (deep venous thrombosis) ([place])   . Glaucoma    no eye drops   . Hepatic cyst   . Hypercholesterolemia   . Hypertension   . Renal cyst   . Sleep apnea   . Tubulovillous adenoma     Current Outpatient Medications:  .  Alcohol Swabs PADS, Use to clean area before insulin injections. DX E11.8, Disp: 200 each, Rfl: 3 .  Azilsartan-Chlorthalidone (EDARBYCLOR) 40-12.5 MG TABS, Take 1 tablet by mouth daily., Disp: 21 tablet, Rfl: 0 .  Cholecalciferol 2000 units TABS, Take 1 tablet (2,000 Units total) by mouth daily., Disp: 90 tablet, Rfl: 1 .  dapagliflozin propanediol (FARXIGA) 10 MG TABS tablet, Take 10 mg by mouth daily., Disp: 90 tablet, Rfl: 1 .  FARXIGA 10 MG TABS tablet, TAKE 1 TABLET BY MOUTH DAILY, Disp: 90 tablet, Rfl: 1 .  ferrous sulfate 325 (65 FE) MG tablet, Take 1 tablet (325 mg total) by mouth 2 (two) times daily with a meal., Disp: 180 tablet, Rfl: 1 .  gabapentin (NEURONTIN) 300 MG capsule, TAKE 1 CAPSULE(300 MG) BY MOUTH TWICE DAILY, Disp: 180 capsule, Rfl: 1 .  glucose blood (ACCU-CHEK GUIDE) test strip, 1 each by Other route 3 (three) times daily. Use as instructed, Disp: 100 each, Rfl: 12 .  insulin aspart (NOVOLOG FLEXPEN) 100 UNIT/ML FlexPen, Inject 10 Units into the skin 3 (three) times daily with meals., Disp: 45 mL, Rfl: 1 .  Insulin Degludec (TRESIBA FLEXTOUCH) 200 UNIT/ML SOPN, Inject 38 Units into the skin daily before breakfast., Disp: 3 pen, Rfl: 1 .   Insulin Pen Needle (EASY TOUCH PEN NEEDLES) 31G X 5 MM MISC, ADMINISTER LANTUS ONCE EVERY EVENING, Disp: 100 each, Rfl: 3 .  Multiple Vitamin (MULTIVITAMIN WITH MINERALS) TABS tablet, Take 1 tablet by mouth daily., Disp: , Rfl:  .  oxybutynin (DITROPAN) 5 MG tablet, TAKE 1 TABLET EVERY DAY (SUBSTITUTED FOR DITROPAN), Disp: 90 tablet, Rfl: 1 .  rivaroxaban (XARELTO) 20 MG TABS tablet, Take 1 tablet (20 mg total) by mouth daily with supper., Disp: 90 tablet, Rfl: 1 .  simvastatin (ZOCOR) 40 MG tablet, Take 1 tablet (40 mg total) by mouth at bedtime., Disp: 90 tablet, Rfl: 1 .  simvastatin (ZOCOR) 40 MG tablet, TAKE 1 TABLET(40 MG) BY MOUTH AT BEDTIME, Disp: 90 tablet, Rfl: 1 .  tamsulosin (FLOMAX) 0.4 MG CAPS capsule, TAKE 1 CAPSULE(0.4 MG) BY MOUTH DAILY AFTER BREAKFAST, Disp: 90 capsule, Rfl: 1 .  TRUEPLUS LANCETS 33G MISC, USE TO CHECK BLOOD SUGARS THREE TIMES DAY, Disp: 300 each, Rfl: 1  Social History   Tobacco Use  Smoking Status Never Smoker  Smokeless Tobacco Never Used    Allergies  Allergen Reactions  . Metformin And Related Diarrhea  . Codeine Rash    All over the body   Objective:  There were no vitals filed for this visit. There is no  height or weight on file to calculate BMI. Constitutional Well developed. Well nourished.  Vascular Dorsalis pedis pulses present 1+ bilaterally  Posterior tibial pulses absent bilaterally  Pedal hair growth absent. Capillary refill normal to all digits.  No cyanosis or clubbing noted.  Neurologic Normal speech. Oriented to person, place, and time. Epicritic sensation to light touch grossly present bilaterally. Protective sensation with 5.07 monofilament  present bilaterally. Vibratory sensation present bilaterally.  Dermatologic Nails elongated, thickened, dystrophic. No open wounds. No skin lesions.  Orthopedic: Normal joint ROM without pain or crepitus bilaterally. HAV, Hammertoes bilat with overlapping 2nd toe No bony tenderness.    Assessment:   1. Diabetes mellitus type 2 with peripheral artery disease ([place])   2. Onychomycosis   3. Acquired hallux valgus of both feet   4. Hammer toes of both feet   5. Crossover toe deformity of left foot   6. Overlapping toe, right    Plan:  Patient was evaluated and treated and all questions answered.  Diabetes with PAD, Onychomycosis -Educated on diabetic footcare. Diabetic risk level 1 -Nails x10 debrided sharply and manually with large nail nipper and rotary burr.   Procedure: Nail Debridement Rationale: Patient meets criteria for routine foot care due to PAD Type of Debridement: manual, sharp debridement. Instrumentation: Nail nipper, rotary burr. Number of Nails: 10   HAV, Hammertoes, Overlapping 2nd Toe -Would benefit from DM shoes will get appt for fabrication  Return in about 3 months (around 10/17/2018) for Diabetic Foot Care.

## 2018-07-18 DIAGNOSIS — M4620 Osteomyelitis of vertebra, site unspecified: Secondary | ICD-10-CM | POA: Diagnosis not present

## 2018-07-19 DIAGNOSIS — M4620 Osteomyelitis of vertebra, site unspecified: Secondary | ICD-10-CM | POA: Diagnosis not present

## 2018-07-20 DIAGNOSIS — M4620 Osteomyelitis of vertebra, site unspecified: Secondary | ICD-10-CM | POA: Diagnosis not present

## 2018-07-21 DIAGNOSIS — M4620 Osteomyelitis of vertebra, site unspecified: Secondary | ICD-10-CM | POA: Diagnosis not present

## 2018-07-22 DIAGNOSIS — M4620 Osteomyelitis of vertebra, site unspecified: Secondary | ICD-10-CM | POA: Diagnosis not present

## 2018-07-24 DIAGNOSIS — M4620 Osteomyelitis of vertebra, site unspecified: Secondary | ICD-10-CM | POA: Diagnosis not present

## 2018-07-25 DIAGNOSIS — M4620 Osteomyelitis of vertebra, site unspecified: Secondary | ICD-10-CM | POA: Diagnosis not present

## 2018-07-26 ENCOUNTER — Ambulatory Visit: Payer: Medicare HMO | Admitting: Orthotics

## 2018-07-26 DIAGNOSIS — M4620 Osteomyelitis of vertebra, site unspecified: Secondary | ICD-10-CM | POA: Diagnosis not present

## 2018-07-26 DIAGNOSIS — M2041 Other hammer toe(s) (acquired), right foot: Secondary | ICD-10-CM

## 2018-07-26 DIAGNOSIS — M2012 Hallux valgus (acquired), left foot: Secondary | ICD-10-CM

## 2018-07-26 DIAGNOSIS — M2042 Other hammer toe(s) (acquired), left foot: Secondary | ICD-10-CM

## 2018-07-26 DIAGNOSIS — E1151 Type 2 diabetes mellitus with diabetic peripheral angiopathy without gangrene: Secondary | ICD-10-CM

## 2018-07-26 DIAGNOSIS — M2011 Hallux valgus (acquired), right foot: Secondary | ICD-10-CM

## 2018-07-27 DIAGNOSIS — M4620 Osteomyelitis of vertebra, site unspecified: Secondary | ICD-10-CM | POA: Diagnosis not present

## 2018-07-28 DIAGNOSIS — M4620 Osteomyelitis of vertebra, site unspecified: Secondary | ICD-10-CM | POA: Diagnosis not present

## 2018-07-28 LAB — HM DIABETES FOOT EXAM

## 2018-07-29 DIAGNOSIS — M4620 Osteomyelitis of vertebra, site unspecified: Secondary | ICD-10-CM | POA: Diagnosis not present

## 2018-07-30 DIAGNOSIS — M4620 Osteomyelitis of vertebra, site unspecified: Secondary | ICD-10-CM | POA: Diagnosis not present

## 2018-07-31 DIAGNOSIS — M4620 Osteomyelitis of vertebra, site unspecified: Secondary | ICD-10-CM | POA: Diagnosis not present

## 2018-08-01 DIAGNOSIS — M4620 Osteomyelitis of vertebra, site unspecified: Secondary | ICD-10-CM | POA: Diagnosis not present

## 2018-08-02 DIAGNOSIS — M4620 Osteomyelitis of vertebra, site unspecified: Secondary | ICD-10-CM | POA: Diagnosis not present

## 2018-08-03 DIAGNOSIS — M4620 Osteomyelitis of vertebra, site unspecified: Secondary | ICD-10-CM | POA: Diagnosis not present

## 2018-08-24 ENCOUNTER — Encounter: Payer: Self-pay | Admitting: Internal Medicine

## 2018-08-24 NOTE — Progress Notes (Signed)
Outside notes received. Information abstracted. Notes sent to scan.  

## 2018-09-10 NOTE — Progress Notes (Signed)

## 2018-09-13 ENCOUNTER — Ambulatory Visit: Payer: Medicare HMO | Admitting: Orthotics

## 2018-09-13 DIAGNOSIS — E1151 Type 2 diabetes mellitus with diabetic peripheral angiopathy without gangrene: Secondary | ICD-10-CM

## 2018-09-13 DIAGNOSIS — M2041 Other hammer toe(s) (acquired), right foot: Secondary | ICD-10-CM

## 2018-09-13 DIAGNOSIS — M2042 Other hammer toe(s) (acquired), left foot: Secondary | ICD-10-CM

## 2018-09-13 DIAGNOSIS — M2012 Hallux valgus (acquired), left foot: Secondary | ICD-10-CM

## 2018-09-13 DIAGNOSIS — M2011 Hallux valgus (acquired), right foot: Secondary | ICD-10-CM

## 2018-09-25 ENCOUNTER — Ambulatory Visit (INDEPENDENT_AMBULATORY_CARE_PROVIDER_SITE_OTHER): Payer: Medicare HMO | Admitting: Orthotics

## 2018-09-25 DIAGNOSIS — M2042 Other hammer toe(s) (acquired), left foot: Secondary | ICD-10-CM

## 2018-09-25 DIAGNOSIS — E1151 Type 2 diabetes mellitus with diabetic peripheral angiopathy without gangrene: Secondary | ICD-10-CM

## 2018-09-25 DIAGNOSIS — M2041 Other hammer toe(s) (acquired), right foot: Secondary | ICD-10-CM

## 2018-09-25 DIAGNOSIS — M2012 Hallux valgus (acquired), left foot: Secondary | ICD-10-CM | POA: Diagnosis not present

## 2018-09-25 DIAGNOSIS — M2011 Hallux valgus (acquired), right foot: Secondary | ICD-10-CM

## 2018-09-25 NOTE — Progress Notes (Signed)
ggPatient presents for diabetic shoe pick up, shoes are tried on for good fit.  Patient received 1 Pair and 3 pairs custom molded diabetic inserts.  Verbal and written break in and wear instructions given.  Patient will follow up for scheduled routine care.

## 2018-10-04 NOTE — Progress Notes (Signed)
Patient came in today to pick up diabetic shoes and custom inserts.  Same was well pleased with fit and function.   The patient could ambulate without any discomfort; there were no signs of any quality issues. The foot ortheses offered full contact with plantar surface and contoured the arch well.   The shoes fit well with no heel slippage and areas of pressure concern.   Patient advised to contact us if any problems arise.  Patient also advised on how to report any issues.  Dawn to put in charges

## 2018-10-09 ENCOUNTER — Other Ambulatory Visit (INDEPENDENT_AMBULATORY_CARE_PROVIDER_SITE_OTHER): Payer: Medicare HMO

## 2018-10-09 DIAGNOSIS — Z794 Long term (current) use of insulin: Secondary | ICD-10-CM

## 2018-10-09 DIAGNOSIS — E1165 Type 2 diabetes mellitus with hyperglycemia: Secondary | ICD-10-CM

## 2018-10-09 LAB — COMPREHENSIVE METABOLIC PANEL
ALT: 19 U/L (ref 0–53)
AST: 16 U/L (ref 0–37)
Albumin: 4.3 g/dL (ref 3.5–5.2)
Alkaline Phosphatase: 65 U/L (ref 39–117)
BUN: 16 mg/dL (ref 6–23)
CO2: 24 mEq/L (ref 19–32)
Calcium: 9.2 mg/dL (ref 8.4–10.5)
Chloride: 105 mEq/L (ref 96–112)
Creatinine, Ser: 1.13 mg/dL (ref 0.40–1.50)
GFR: 81.9 mL/min (ref >60.00)
Glucose, Bld: 312 mg/dL — ABNORMAL HIGH (ref 70–99)
Potassium: 3.8 mEq/L (ref 3.5–5.1)
Sodium: 137 mEq/L (ref 135–145)
Total Bilirubin: 0.5 mg/dL (ref 0.2–1.2)
Total Protein: 7 g/dL (ref 6.0–8.3)

## 2018-10-09 LAB — HEMOGLOBIN A1C: Hgb A1c MFr Bld: 8.3 % — ABNORMAL HIGH (ref 4.6–6.5)

## 2018-10-11 ENCOUNTER — Encounter: Payer: Self-pay | Admitting: Endocrinology

## 2018-10-11 NOTE — Progress Notes (Signed)
Patient ID: Charles Parker, male   DOB: 11/17/1945, 73 y.o.   MRN: [id number]          Reason for Appointment: Follow-up for Type 2 Diabetes  Referring physician: Scarlette Calico   History of Present Illness:          Date of diagnosis of type 2 diabetes mellitus: 2014        Background history:   He has been on various oral hypoglycemic drugs since the onset including metformin, Januvia and Amaryl Was also on Invokana in 2015 for about a year but not clear why this was stopped His level of control has been quite variable with highest A1c 13.6 in 06/2015  He has been on insulin since about 10/2014 as documented in his record  Recent history:   INSULIN regimen is:  [place] 20  units at suppertime, NovoLog 10 units before each meal  Non-insulin hypoglycemic drugs the patient is taking WUJ:WJXBJYN 10 mg daily  His most recent A1c was 7.2 and is now 8.3   Current management, blood sugar patterns and problems identified:  He states that he is checking his blood sugars every couple of days but he has no readings on his meter since October  He was supposed to be taking 32 units of Tresiba once a day in the evening but he states that he is taking 20 units and not clear whether he is doing this once a day or twice a day  He also thinks that he is taking NovoLog regularly but his blood sugars both in his lab work and today are significantly high before lunch  He is also confused about whether he is taking the NovoLog twice a day or 3 times a day  He does not report any hypoglycemic symptoms  For his breakfast he will usually have oatmeal without any protein but today had a sausage biscuits from a fast food restaurant  He appears to have lost some weight since his last visit and not clear why, he does not think he has a decreased appetite   Side effects from medications have been: Hives from metformin  Compliance with the medical regimen: Inconsistent  Glucose monitoring:  done< 1  times a   day usually        Glucometer: Accu-Chek guide      Blood Glucose readings by : Download  FASTING range 111-134 with only 3 readings, no other readings present  Self-care: The diet that the patient has been following is: tries to limit [employer] .      Typical meal intake: Breakfast is eggs/cereal or oatmeal usually, lunch: Cold cut sandwiches.  Dinner chicken and vegetables.   Snacks will be chips, peanut butter crackers Supper at 6 pm                Dietician visit, most recent:3/19               Exercise: some walking around his apartment area and is not able to do much because of leg weakness   Weight history:  Wt Readings from Last 3 Encounters:  10/12/18 195 lb 9.6 oz (88.7 kg)  07/11/18 203 lb (92.1 kg)  05/18/18 203 lb (92.1 kg)    Glycemic control:   Lab Results  Component Value Date   HGBA1C 8.3 (H) 10/09/2018   HGBA1C 7.2 (H) 07/07/2018   HGBA1C 8.2 (H) 03/03/2018   Lab Results  Component Value Date   MICROALBUR <0.7 03/03/2018  [place] 91 03/03/2018   CREATININE 1.13 10/09/2018   Lab Results  Component Value Date   MICRALBCREAT 1.4 03/03/2018    Lab Results  Component Value Date   FRUCTOSAMINE 330 (H) 05/05/2018   FRUCTOSAMINE 331 (H) 12/29/2017   FRUCTOSAMINE 334 (H) 08/05/2017      Allergies as of 10/12/2018      Reactions   Metformin And Related Diarrhea   Codeine Rash   All over the body      Medication List       Accurate as of October 12, 2018  1:08 PM. Always use your most recent med list.        Alcohol Swabs Pads Use to clean area before insulin injections. DX E11.8   Azilsartan-Chlorthalidone 40-12.5 MG Tabs Commonly known as:  EDARBYCLOR Take 1 tablet by mouth daily.   Cholecalciferol 50 MCG (2000 UT) Tabs Take 1 tablet (2,000 Units total) by mouth daily.   dapagliflozin propanediol 10 MG Tabs tablet Commonly known as:  FARXIGA Take 10 mg by mouth daily.   ferrous sulfate 325 (65 FE) MG tablet Take 1  tablet (325 mg total) by mouth 2 (two) times daily with a meal.   gabapentin 300 MG capsule Commonly known as:  NEURONTIN TAKE 1 CAPSULE(300 MG) BY MOUTH TWICE DAILY   glucose blood test strip Commonly known as:  ACCU-CHEK GUIDE 1 each by Other route 3 (three) times daily. Use as instructed   insulin aspart 100 UNIT/ML FlexPen Commonly known as:  NOVOLOG FLEXPEN Inject 10 Units into the skin 3 (three) times daily with meals.   insulin aspart protamine - aspart (70-30) 100 UNIT/ML FlexPen Commonly known as:  NOVOLOG MIX 70/30 FLEXPEN 30 U acb and 20 U acs   Insulin Degludec 200 UNIT/ML Sopn Commonly known as:  TRESIBA FLEXTOUCH Inject 38 Units into the skin daily before breakfast.   Insulin Pen Needle 31G X 5 MM Misc Commonly known as:  EASY TOUCH PEN NEEDLES ADMINISTER LANTUS ONCE EVERY EVENING   multivitamin with minerals Tabs tablet Take 1 tablet by mouth daily.   oxybutynin 5 MG tablet Commonly known as:  DITROPAN TAKE 1 TABLET EVERY DAY (SUBSTITUTED FOR DITROPAN)   rivaroxaban 20 MG Tabs tablet Commonly known as:  XARELTO Take 1 tablet (20 mg total) by mouth daily with supper.   simvastatin 40 MG tablet Commonly known as:  ZOCOR TAKE 1 TABLET(40 MG) BY MOUTH AT BEDTIME   tamsulosin 0.4 MG Caps capsule Commonly known as:  FLOMAX TAKE 1 CAPSULE(0.4 MG) BY MOUTH DAILY AFTER BREAKFAST   TRUEPLUS LANCETS 33G Misc USE TO CHECK BLOOD SUGARS THREE TIMES DAY       Allergies:  Allergies  Allergen Reactions  . Metformin And Related Diarrhea  . Codeine Rash    All over the body    Past Medical History:  Diagnosis Date  . Clotting disorder (HCC)    DVT  . Diabetes mellitus    type II  . Diverticulosis   . DVT (deep venous thrombosis) ([place])   . Glaucoma    no eye drops   . Hepatic cyst   . Hypercholesterolemia   . Hypertension   . Renal cyst   . Sleep apnea   . Tubulovillous adenoma     Past Surgical History:  Procedure Laterality Date  . BLADDER  NECK RECONSTRUCTION  02/03/2012   Procedure: BLADDER NECK REPAIR;  Surgeon: Adin Hector, MD;  Location: [facility];  Service: General;;  . COLONOSCOPY  02/2013   polyp removal(mult.)  . KNEE SURGERY  2004   left  . POLYPECTOMY    . PROCTOSCOPY  02/03/2012   Procedure: PROCTOSCOPY;  Surgeon: Adin Hector, MD;  Location: [facility];  Service: General;;  . STOMACH SURGERY  02/2012  . TEE WITHOUT CARDIOVERSION N/A 10/05/2016   Procedure: TRANSESOPHAGEAL ECHOCARDIOGRAM (TEE);  Surgeon: Sanda Klein, MD;  Location[facility] END[facility]COPY;  Service: Cardiovascular;  Laterality: N/A;    Family History  Problem Relation Age of Onset  . Hypertension Mother   . Diabetes Mother   . Cancer Mother        ? location  . Colon cancer Father 74  . Colon polyps Neg Hx     Social History:  reports that he has never smoked. He has never used smokeless tobacco. He reports that he does not drink alcohol or use drugs.   Review of Systems   Lipid history: Has been taking Zocor 40 mg with good control, followed by PCP    Lab Results  Component Value Date   CHOL 165 03/03/2018   HDL 39.20 03/03/2018   LDLCALC 91 03/03/2018   LDLDIRECT 118.0 11/11/2015   TRIG 172.0 (H) 03/03/2018   CHOLHDL 4 03/03/2018             Most recent foot exam: 9/18  RENAL function is stable with using Farxiga 10 mg  Lab Results  Component Value Date   CREATININE 1.13 10/09/2018   BUN 16 10/09/2018   NA 137 10/09/2018   K 3.8 10/09/2018   CL 105 10/09/2018   CO2 24 10/09/2018   His blood pressure is today improved, followed by PCP  BP Readings from Last 3 Encounters:  10/12/18 126/70  07/11/18 (!) 140/92  05/18/18 (!) 152/98     LABS:  Office Visit on 10/12/2018  Component Date Value Ref Range Status  . POC Glucose 10/12/2018 290* 70 - 99 mg/dl Final  Lab on 10/09/2018  Component Date Value Ref Range Status  . Sodium 10/09/2018 137  135 - 145 mEq/L Final  . Potassium 10/09/2018 3.8  3.5 - 5.1 mEq/L Final  .  Chloride 10/09/2018 105  96 - 112 mEq/L Final  . CO2 10/09/2018 24  19 - 32 mEq/L Final  . Glucose, Bld 10/09/2018 312* 70 - 99 mg/dL Final  . BUN 10/09/2018 16  6 - 23 mg/dL Final  . Creatinine, Ser 10/09/2018 1.13  0.40 - 1.50 mg/dL Final  . Total Bilirubin 10/09/2018 0.5  0.2 - 1.2 mg/dL Final  . Alkaline Phosphatase 10/09/2018 65  39 - 117 U/L Final  . AST 10/09/2018 16  0 - 37 U/L Final  . ALT 10/09/2018 19  0 - 53 U/L Final  . Total Protein 10/09/2018 7.0  6.0 - 8.3 g/dL Final  . Albumin 10/09/2018 4.3  3.5 - 5.2 g/dL Final  . Calcium 10/09/2018 9.2  8.4 - 10.5 mg/dL Final  . GFR 10/09/2018 81.90  >60.00 mL/min Final  . Hgb A1c MFr Bld 10/09/2018 8.3* 4.6 - 6.5 % Final   Glycemic Control Guidelines for People with Diabetes:Non Diabetic:  <6%Goal of Therapy: <7%Additional Action Suggested:  >8%     Physical Examination:  BP 126/70 (BP Location: Left Arm, Patient Position: Sitting, Cuff Size: Normal)   Pulse (!) 104   Ht 5\' 11"  (1.803 m)   Wt 195 lb 9.6 oz (88.7 kg)   SpO2 92%   BMI 27.28 kg/m  ASSESSMENT:  Diabetes type 2, recent BMI 27  See history of present illness for detailed discussion of current diabetes management, blood sugar patterns and problems identified   He is on a regimen of basal bolus insulin and Farxiga  His A1c is significantly higher 8.3  His A1c has gone up and his blood sugars in the lab are nearly 300 twice He does not monitor his blood sugar He is likely not taking his insulin consistently and has difficulty understanding his instructions Also his diet can be either high carbohydrate or high fat at times His weight loss may be related to hyperglycemia but not clear  He likely has dementia and has difficulty following instructions.  His brother was called to come to the office visit today but did not arrive  HYPERTENSION: Appears better controlled  PLAN:    He will be changed to premixed insulin twice a day to simplify his  regimen  He will start with 30 units of NovoLog mix at breakfast and 20 at dinnertime  Given detailed instructions in writing and asked him to follow the instructions and put away his previous insulins  To continue [place]  Needs to check blood sugars twice a day daily  He will review his management with the nurse educator in 1 week and then follow-up here in 1 month  Needs to have his family involved and his brother should come with him to each visit  Patient Instructions  Check blood sugars on waking up 4 days a week  Also check blood sugars about 2 hours after meals and do this after different meals by rotation  Recommended blood sugar levels on waking up are 90-130 and about 2 hours after meal is 130-180  Please bring your blood sugar monitor to each visit, thank you  Stop all prior insulin regimens and take only the new insulinas follows   NOVOLOG MIX INSULIN IS YOUR ONLY INSULIN:  TAKE 30 UNITS BEFORE BREAKFAST AND 20 AT SUPPER  Have an egg or cheese with oatmeal in am      ADDENDUM: Patient called the office back and stated that he is taking 10 units of NovoLog and not 30 as he thought   Elayne Snare 10/12/2018, 1:08 PM   Note: This office note was prepared with Dragon voice recognition system technology. Any transcriptional errors that result from this process are unintentional.

## 2018-10-12 ENCOUNTER — Encounter: Payer: Self-pay | Admitting: Endocrinology

## 2018-10-12 ENCOUNTER — Other Ambulatory Visit: Payer: Self-pay

## 2018-10-12 ENCOUNTER — Ambulatory Visit (INDEPENDENT_AMBULATORY_CARE_PROVIDER_SITE_OTHER): Payer: Medicare HMO | Admitting: Endocrinology

## 2018-10-12 VITALS — BP 126/70 | HR 104 | Ht 71.0 in | Wt 195.6 lb

## 2018-10-12 DIAGNOSIS — E1165 Type 2 diabetes mellitus with hyperglycemia: Secondary | ICD-10-CM

## 2018-10-12 DIAGNOSIS — Z794 Long term (current) use of insulin: Secondary | ICD-10-CM

## 2018-10-12 LAB — GLUCOSE, POCT (MANUAL RESULT ENTRY): POC Glucose: 290 mg/dl — AB (ref 70–99)

## 2018-10-12 MED ORDER — INSULIN ASPART PROT & ASPART (70-30 MIX) 100 UNIT/ML PEN: PEN_INJECTOR | SUBCUTANEOUS | 1 refills | Status: DC

## 2018-10-12 NOTE — Patient Instructions (Signed)
Check blood sugars on waking up 4 days a week  Also check blood sugars about 2 hours after meals and do this after different meals by rotation  Recommended blood sugar levels on waking up are 90-130 and about 2 hours after meal is 130-180  Please bring your blood sugar monitor to each visit, thank you  Stop all prior insulin regimens and take only the new insulinas follows   NOVOLOG MIX INSULIN IS YOUR ONLY INSULIN:  TAKE 30 UNITS BEFORE BREAKFAST AND 20 AT SUPPER  Have an egg or cheese with oatmeal in am

## 2018-10-13 ENCOUNTER — Telehealth: Payer: Self-pay | Admitting: Gastroenterology

## 2018-10-13 NOTE — Telephone Encounter (Signed)
Dr Havery Moros please disregard the last message from [facility].   I have called the pt and left a message to advise him to set up an appt to come in and discuss repeat colon due to complications at his last appt with his blood sugar and poor prep.

## 2018-10-13 NOTE — Telephone Encounter (Signed)
Thanks Patty! 

## 2018-10-13 NOTE — Telephone Encounter (Signed)
Please advise on when the patient needs to have a repeat colonoscopy as last colonoscopy was 06/2016 and 08/2016

## 2018-10-13 NOTE — Telephone Encounter (Signed)
Pt called to inquire when he is due for his next colonoscopy.  Pt last colon 06/2016 and 08/2016.

## 2018-10-13 NOTE — Telephone Encounter (Signed)
The pt was scheduled for colon in November of 2017, the procedure could not proceed due to hyperglycemia (400 at 1450)  He was to call to reschedule and failed to do so.  I have attempted to call the pt and advise him he would need to set up an appt to see Dr Havery Moros to discuss.  No answer had to leave a message.

## 2018-10-16 NOTE — Telephone Encounter (Signed)
The patient has been notified of this information and all questions answered. Appt made for 2/6 with Dr Havery Moros.

## 2018-10-17 ENCOUNTER — Ambulatory Visit: Payer: Medicare HMO | Admitting: Podiatry

## 2018-10-18 ENCOUNTER — Encounter: Payer: Medicare HMO | Attending: Endocrinology | Admitting: Nutrition

## 2018-10-18 DIAGNOSIS — Z794 Long term (current) use of insulin: Secondary | ICD-10-CM | POA: Insufficient documentation

## 2018-10-18 DIAGNOSIS — E118 Type 2 diabetes mellitus with unspecified complications: Secondary | ICD-10-CM | POA: Diagnosis not present

## 2018-10-18 NOTE — Patient Instructions (Signed)
Add protein to breakfast and lunch meals.  See list of suggestions for this. Start new insulin this evening.  20 units before supper and 30u before breakfast. Test blood sugar before breakfast every other day, and before supper every other day.

## 2018-10-18 NOTE — Progress Notes (Signed)
Patient reports that he stopped the old insulin per Dr. Ronnie Derby instruction, and that the new insulinwas not ready at the drug store, when he stopPed there, and has not been back.Charles Parker  He did ot bring his meter, and said his blood sugar yesterday morning was 159.  [facility] was called and they said they have it there ready for him.  We reviewed how to take this new insulin, and written instructions were given for his dose.  Stressed the need to start this insulin tonight, and to make sure he mixes the insulin, before giving it.  He reported good understanding of this and agreed to go by and pick it up and start it this evening.  Discussed the need to test blood sugar acB and acS.  He agreed to do this.  He will call if questions.  Meals are no balanced, with oatmeal in the AM, and at lunch: a banana sandwich.  Supper is usually veg. Plate with bread. Gave written instructions on things to add to breakfast and lunch to equal between 7 and 14 grams of protein and suggested bean with vegetable plate.  He aggred to eat this.  He had no final questions.

## 2018-11-09 ENCOUNTER — Ambulatory Visit: Payer: Medicare HMO | Admitting: Endocrinology

## 2018-11-09 ENCOUNTER — Encounter: Payer: Self-pay | Admitting: Gastroenterology

## 2018-11-09 ENCOUNTER — Telehealth: Payer: Self-pay

## 2018-11-09 ENCOUNTER — Other Ambulatory Visit: Payer: Self-pay | Admitting: Internal Medicine

## 2018-11-09 ENCOUNTER — Ambulatory Visit (INDEPENDENT_AMBULATORY_CARE_PROVIDER_SITE_OTHER): Payer: Medicare HMO | Admitting: Gastroenterology

## 2018-11-09 VITALS — BP 108/76 | HR 80 | Ht 68.5 in | Wt 193.4 lb

## 2018-11-09 DIAGNOSIS — Z7901 Long term (current) use of anticoagulants: Secondary | ICD-10-CM

## 2018-11-09 DIAGNOSIS — Z86010 Personal history of colonic polyps: Secondary | ICD-10-CM | POA: Diagnosis not present

## 2018-11-09 MED ORDER — SUPREP BOWEL PREP KIT 17.5-3.13-1.6 GM/177ML PO SOLN: ORAL | 0 refills | Status: DC

## 2018-11-09 NOTE — Patient Instructions (Signed)
If you are age 73 or older, your body mass index should be between 23-30. Your Body mass index is 28.97 kg/m. If this is out of the aforementioned range listed, please consider follow up with your Primary Care Provider.  If you are age 42 or younger, your body mass index should be between 19-25. Your Body mass index is 28.97 kg/m. If this is out of the aformentioned range listed, please consider follow up with your Primary Care Provider.   You have been scheduled for a colonoscopy. Please follow written instructions given to you at your visit today.  Please pick up your prep supplies at the pharmacy within the next 1-3 days. If you use inhalers (even only as needed), please bring them with you on the day of your procedure. Your physician has requested that you go to www.startemmi.com and enter the access code given to you at your visit today. This web site gives a general overview about your procedure. However, you should still follow specific instructions given to you by our office regarding your preparation for the procedure.  You will be contacted by our office prior to your procedure for directions on holding your Xarelto.  If you do not hear from our office 1 week prior to your scheduled procedure, please call [phone] to discuss.  Thank you for entrusting me with your care and for choosing [facility], Dr. [place] Cellar

## 2018-11-09 NOTE — Progress Notes (Signed)
HPI :  73 y/o male with a history of DVT, on chronic Xarelto. He had a colonoscopy with me in 06/2016 in which the prep was poor and inadequate for screening. One 63mm polyp removed at the time. He had followed up 08/2016 for another exam however his blood sugar was persistently > 400s despite administration of insulin and procedure was cancelled until his diabetes was under control. I have not seen him since that time.  He is here to follow up today in regards to scheduling a follow up colonoscopy. He denies any problems with his bowels. No diarrhea or constipation. No blood in his stools. He has been undergoing changes with his DM regimen per PCP. His latest A1c is 8.3. He states he thinks with change in insulin dosing recently his glucose levels have been improved. He denies complaints otherwise and generally feels well. He denies any cardiopulmonary symptoms.   Colonoscopy 07/02/2016 - poor prep, 10mm polyp removed from ascending colon, diverticulosis,   Colonoscopy on 05/2013 - 5 flat or sessile polyps in the sigmoid and transverse colon, removed with forceps. Surgical anastomosis in sigmoid colon, diverticulosis. Path shows serrated adenoma of the transverse colon, with hyperplastic polyps in the left colon.   Past Medical History:  Diagnosis Date  . Clotting disorder (HCC)    DVT  . Diabetes mellitus    type II  . Diverticulosis   . DVT (deep venous thrombosis) ([place])   . Glaucoma    no eye drops   . Hepatic cyst   . Hypercholesterolemia   . Hypertension   . Renal cyst   . Sleep apnea   . Tubulovillous adenoma      Past Surgical History:  Procedure Laterality Date  . BLADDER NECK RECONSTRUCTION  02/03/2012   Procedure: BLADDER NECK REPAIR;  Surgeon: Adin Hector, MD;  Location: [facility];  Service: General;;  . COLONOSCOPY  02/2013   polyp removal(mult.)  . KNEE SURGERY  2004   left  . POLYPECTOMY    . PROCTOSCOPY  02/03/2012   Procedure: PROCTOSCOPY;  Surgeon: Adin Hector,  MD;  Location: [facility];  Service: General;;  . STOMACH SURGERY  02/2012  . TEE WITHOUT CARDIOVERSION N/A 10/05/2016   Procedure: TRANSESOPHAGEAL ECHOCARDIOGRAM (TEE);  Surgeon: Sanda Klein, MD;  Location: [facility] ENDOSCOPY;  Service: Cardiovascular;  Laterality: N/A;   Family History  Problem Relation Age of Onset  . Hypertension Mother   . Diabetes Mother   . Cancer Mother        ? location  . Colon cancer Father 23  . Colon polyps Neg Hx    Social History   Tobacco Use  . Smoking status: Never Smoker  . Smokeless tobacco: Never Used  Substance Use Topics  . Alcohol use: No    Alcohol/week: 0.0 standard drinks    Comment: once every two months  . Drug use: No   Current Outpatient Medications  Medication Sig Dispense Refill  . Alcohol Swabs PADS Use to clean area before insulin injections. DX E11.8 200 each 3  . Azilsartan-Chlorthalidone (EDARBYCLOR) 40-12.5 MG TABS Take 1 tablet by mouth daily. 21 tablet 0  . Cholecalciferol 2000 units TABS Take 1 tablet (2,000 Units total) by mouth daily. 90 tablet 1  . dapagliflozin propanediol (FARXIGA) 10 MG TABS tablet Take 10 mg by mouth daily. 90 tablet 1  . ferrous sulfate 325 (65 FE) MG tablet Take 1 tablet (325 mg total) by mouth 2 (two) times daily with a  meal. 180 tablet 1  . gabapentin (NEURONTIN) 300 MG capsule TAKE 1 CAPSULE(300 MG) BY MOUTH TWICE DAILY 180 capsule 1  . glucose blood (ACCU-CHEK GUIDE) test strip 1 each by Other route 3 (three) times daily. Use as instructed 100 each 12  . insulin aspart protamine- aspart (NOVOLOG MIX 70/30) (70-30) 100 UNIT/ML injection Inject into the skin. 30 units before breakfast and 20 units before supper    . Insulin Pen Needle (EASY TOUCH PEN NEEDLES) 31G X 5 MM MISC ADMINISTER LANTUS ONCE EVERY EVENING 100 each 3  . Multiple Vitamin (MULTIVITAMIN WITH MINERALS) TABS tablet Take 1 tablet by mouth daily.    Marland Kitchen oxybutynin (DITROPAN) 5 MG tablet TAKE 1 TABLET EVERY DAY (SUBSTITUTED FOR DITROPAN) 90  tablet 1  . rivaroxaban (XARELTO) 20 MG TABS tablet Take 1 tablet (20 mg total) by mouth daily with supper. 90 tablet 1  . simvastatin (ZOCOR) 40 MG tablet TAKE 1 TABLET(40 MG) BY MOUTH AT BEDTIME 90 tablet 1  . tamsulosin (FLOMAX) 0.4 MG CAPS capsule TAKE 1 CAPSULE(0.4 MG) BY MOUTH DAILY AFTER BREAKFAST 90 capsule 1  . TRUEPLUS LANCETS 33G MISC USE TO CHECK BLOOD SUGARS THREE TIMES DAY 300 each 1   No current facility-administered medications for this visit.    Allergies  Allergen Reactions  . Metformin And Related Diarrhea  . Codeine Rash    All over the body     Review of Systems: All systems reviewed and negative except where noted in HPI.   Lab Results  Component Value Date   WBC 4.0 05/18/2018   HGB 15.2 05/18/2018   HCT 45.4 05/18/2018   MCV 94.5 05/18/2018   PLT 175.0 05/18/2018    Lab Results  Component Value Date   CREATININE 1.13 10/09/2018   BUN 16 10/09/2018   NA 137 10/09/2018   K 3.8 10/09/2018   CL 105 10/09/2018   CO2 24 10/09/2018    Lab Results  Component Value Date   ALT 19 10/09/2018   AST 16 10/09/2018   ALKPHOS 65 10/09/2018   BILITOT 0.5 10/09/2018      Physical Exam: BP 108/76 (BP Location: Left Arm, Patient Position: Sitting, Cuff Size: Normal)   Pulse 80   Ht 5' 8.5" (1.74 m) Comment: eight measured without shoes  Wt 193 lb 6 oz (87.7 kg)   BMI 28.97 kg/m  Constitutional: Pleasant, male in no acute distress. HEENT: Normocephalic and atraumatic. Conjunctivae are normal. No scleral icterus. Neck supple.  Cardiovascular: Normal rate, regular rhythm.  Pulmonary/chest: Effort normal and breath sounds normal. No wheezing, rales or rhonchi. Abdominal: Soft, nondistended, nontender. There are no masses palpable. No hepatomegaly. Extremities: no edema Lymphadenopathy: No cervical adenopathy noted. Neurological: Alert and oriented to person place and time. Skin: Skin is warm and dry. No rashes noted. Psychiatric: Normal mood and  affect. Behavior is normal.   ASSESSMENT AND PLAN: 73 y/o male here for reassessment of the following issues:  History of colon polyps / anticoagulated / insulin dependant diabetes - as above, last bowel preparation was limited by poor prep and a follow up exam in 2017 was cancelled due to hyperglycemia. He has been following with his PCP for diabetes management, A1c to 8s, overall he thinks doing better on his present regimen. We discussed if he wanted to have another colonoscopy exam after discussion of risks / benefits. I recommend a double prep to ensure he is adequately prepared given his problem with this issue previously. He would also  need to hold Xarelto for 2 days prior to the exam to reduce bleeding risks. After discussion of these issues including risks / benefits of the exam, anesthesia, and withholding anticoagulation, he wanted to proceed. Further recommendations pending the results.   [place] Cellar, MD [facility]

## 2018-11-09 NOTE — Telephone Encounter (Signed)
   Charles Parker [birth year] [id number]  Dear Dr. [name]:  We have scheduled the above named patient for a(n) colonoscopy procedure. Our records show that (s)he is on anticoagulation therapy.  Please advise as to whether the patient may come off their therapy of Xarelto 2 days prior to their procedure which is scheduled for 11-21-2018.  Please route your response to Lemar Lofty, CMA or fax response to [phone].  Sincerely,    [facility]

## 2018-11-10 ENCOUNTER — Other Ambulatory Visit: Payer: Self-pay | Admitting: General Practice

## 2018-11-10 ENCOUNTER — Telehealth: Payer: Self-pay | Admitting: General Practice

## 2018-11-10 ENCOUNTER — Ambulatory Visit: Payer: Medicare HMO

## 2018-11-10 DIAGNOSIS — Z7901 Long term (current) use of anticoagulants: Secondary | ICD-10-CM

## 2018-11-10 MED ORDER — ENOXAPARIN SODIUM 120 MG/0.8ML ~~LOC~~ SOLN: 120.0000 mg | SUBCUTANEOUS | 0 refills | Status: DC

## 2018-11-10 NOTE — Telephone Encounter (Signed)
Lovenox bridge for procedure on 11/21/18.  Saturday, 2/15 - Last dose of Xarelto until after procedure  Sunday, 2/16 - Lovenox injection in the AM  Monday, 2/17 - Lovenox injection in the AM  Tuesday, 2/18 - Colonoscopy (No Lovenox today)  Wednesday, 2/19 - Resume taking Xarelto

## 2018-11-10 NOTE — Telephone Encounter (Signed)
Cindy-  I have not seen him recently. He has a hx of DVT and is anticoagulated with a DOAC. Will you arrange a lovenox bridge for him to under go an endoscopy?  Alona Bene

## 2018-11-10 NOTE — Telephone Encounter (Signed)
Yes.  Patient is coming to see me at 3:00 today to discuss Lovenox bridge.

## 2018-11-10 NOTE — Telephone Encounter (Signed)
Patient verbalized understanding of instructions for Lovenox bridge.

## 2018-11-13 NOTE — Telephone Encounter (Signed)
See phone note on 2-7 from Meriam Sprague regarding Lovenox bridge. Patient has been instructed and verbalized understanding.

## 2018-11-21 ENCOUNTER — Ambulatory Visit (AMBULATORY_SURGERY_CENTER): Payer: Medicare HMO | Admitting: Gastroenterology

## 2018-11-21 ENCOUNTER — Encounter: Payer: Self-pay | Admitting: Gastroenterology

## 2018-11-21 VITALS — BP 126/99 | HR 63 | Temp 97.5°F | Resp 19 | Ht 68.5 in | Wt 193.0 lb

## 2018-11-21 DIAGNOSIS — D122 Benign neoplasm of ascending colon: Secondary | ICD-10-CM | POA: Diagnosis not present

## 2018-11-21 DIAGNOSIS — E119 Type 2 diabetes mellitus without complications: Secondary | ICD-10-CM | POA: Diagnosis not present

## 2018-11-21 DIAGNOSIS — D123 Benign neoplasm of transverse colon: Secondary | ICD-10-CM

## 2018-11-21 DIAGNOSIS — I10 Essential (primary) hypertension: Secondary | ICD-10-CM | POA: Diagnosis not present

## 2018-11-21 DIAGNOSIS — Z860100 Personal history of colon polyps, unspecified: Secondary | ICD-10-CM

## 2018-11-21 DIAGNOSIS — G4733 Obstructive sleep apnea (adult) (pediatric): Secondary | ICD-10-CM | POA: Diagnosis not present

## 2018-11-21 MED ORDER — SODIUM CHLORIDE 0.9 % IV SOLN: 500.0000 mL | Freq: Once | INTRAVENOUS | Status: DC

## 2018-11-21 NOTE — Progress Notes (Signed)
Called to room to assist during endoscopic procedure.  Patient ID and intended procedure confirmed with present staff. Received instructions for my participation in the procedure from the performing physician.  

## 2018-11-21 NOTE — Progress Notes (Signed)
Pt's states no medical or surgical changes since previsit or office visit. 

## 2018-11-21 NOTE — Op Note (Addendum)
Charles Parker Procedure Date: 11/21/2018 10:34 AM MRN: [id number] Endoscopist: Remo Lipps P. Havery Moros , MD Age: 73 Referring MD:  Date of Birth: 1946-07-03 Gender: Male Account #: [id number] Procedure:                Colonoscopy Indications:              High risk colon cancer surveillance: Personal                            history of colonic polyps - last dose of Xarelto 3                            days ago, last dose of Lovenox 1 day ago Medicines:                Monitored Anesthesia Care Procedure:                Pre-Anesthesia Assessment:                           - Prior to the procedure, a History and Physical                            was performed, and patient medications and                            allergies were reviewed. The patient's tolerance of                            previous anesthesia was also reviewed. The risks                            and benefits of the procedure and the sedation                            options and risks were discussed with the patient.                            All questions were answered, and informed consent                            was obtained. Prior Anticoagulants: The patient has                            taken Xarelto (rivaroxaban), last dose was 3 days                            prior to procedure. ASA Grade Assessment: II - A                            patient with mild systemic disease. After reviewing                            the risks and benefits, the patient was deemed in  satisfactory condition to undergo the procedure.                           After obtaining informed consent, the colonoscope                            was passed under direct vision. Throughout the                            procedure, the patient's blood pressure, pulse, and                            oxygen saturations were monitored continuously. The   Colonoscope was introduced through the anus and                            advanced to the the cecum, identified by                            appendiceal orifice and ileocecal valve. The                            colonoscopy was performed without difficulty. The                            patient tolerated the procedure well. The quality                            of the bowel preparation was adequate. The                            ileocecal valve, appendiceal orifice, and rectum                            were photographed. Scope In: 10:47:55 AM Scope Out: 11:12:14 AM Scope Withdrawal Time: 0 hours 20 minutes 59 seconds  Total Procedure Duration: 0 hours 24 minutes 19 seconds  Findings:                 The perianal and digital rectal examinations were                            normal.                           Five sessile polyps were found in the ascending                            colon. The polyps were 3 mm in size. These polyps                            were removed with a cold snare. Resection and                            retrieval were complete.  Four sessile polyps were found in the transverse                            colon. The polyps were 3 to 4 mm in size. These                            polyps were removed with a cold snare. Resection                            and retrieval were complete.                           Scattered small-mouthed diverticula were found in                            the right colon and left colon.                           There was evidence of a prior surgical anastomosis                            in the rectum. This was characterized by healthy                            appearing mucosa.                           The prep took time to lavage to achieve adequate                            views. The exam was otherwise without abnormality. Complications:            No immediate complications. Estimated blood loss:                             Minimal. Estimated Blood Loss:     Estimated blood loss was minimal. Impression:               - Five 3 mm polyps in the ascending colon, removed                            with a cold snare. Resected and retrieved.                           - Four 3 to 4 mm polyps in the transverse colon,                            removed with a cold snare. Resected and retrieved.                           - Diverticulosis in the right colon and in the left                            colon.                           -  Surgical anastomosis, characterized by healthy                            appearing mucosa.                           - The examination was otherwise normal. Recommendation:           - Patient has a contact number available for                            emergencies. The signs and symptoms of potential                            delayed complications were discussed with the                            patient. Return to normal activities tomorrow.                            Written discharge instructions were provided to the                            patient.                           - Resume previous diet.                           - Continue present medications.                           - Resume Xarelto tomorrow                           - Await pathology results. Remo Lipps P. Jaidev Sanger, MD 11/21/2018 11:21:42 AM This report has been signed electronically.

## 2018-11-21 NOTE — Patient Instructions (Signed)
Please read handouts provided. Continue present medications. Await pathology results. Resume Xarelto tomorrow.      YOU HAD AN ENDOSCOPIC PROCEDURE TODAY AT [facility]:   Refer to the procedure report that was given to you for any specific questions about what was found during the examination.  If the procedure report does not answer your questions, please call your gastroenterologist to clarify.  If you requested that your care partner not be given the details of your procedure findings, then the procedure report has been included in a sealed envelope for you to review at your convenience later.  YOU SHOULD EXPECT: Some feelings of bloating in the abdomen. Passage of more gas than usual.  Walking can help get rid of the air that was put into your GI tract during the procedure and reduce the bloating. If you had a lower endoscopy (such as a colonoscopy or flexible sigmoidoscopy) you may notice spotting of blood in your stool or on the toilet paper. If you underwent a bowel prep for your procedure, you may not have a normal bowel movement for a few days.  Please Note:  You might notice some irritation and congestion in your nose or some drainage.  This is from the oxygen used during your procedure.  There is no need for concern and it should clear up in a day or so.  SYMPTOMS TO REPORT IMMEDIATELY:   Following lower endoscopy (colonoscopy or flexible sigmoidoscopy):  Excessive amounts of blood in the stool  Significant tenderness or worsening of abdominal pains  Swelling of the abdomen that is new, acute  Fever of 100F or higher    For urgent or emergent issues, a gastroenterologist can be reached at any hour by calling [phone].   DIET:  We do recommend a small meal at first, but then you may proceed to your regular diet.  Drink plenty of fluids but you should avoid alcoholic beverages for 24 hours.  ACTIVITY:  You should plan to take it easy for the rest of  today and you should NOT DRIVE or use heavy machinery until tomorrow (because of the sedation medicines used during the test).    FOLLOW UP: Our staff will call the number listed on your records the next business day following your procedure to check on you and address any questions or concerns that you may have regarding the information given to you following your procedure. If we do not reach you, we will leave a message.  However, if you are feeling well and you are not experiencing any problems, there is no need to return our call.  We will assume that you have returned to your regular daily activities without incident.  If any biopsies were taken you will be contacted by phone or by letter within the next 1-3 weeks.  Please call us at [phone] if you have not heard about the biopsies in 3 weeks.    SIGNATURES/CONFIDENTIALITY: You and/or your care partner have signed paperwork which will be entered into your electronic medical record.  These signatures attest to the fact that that the information above on your After Visit Summary has been reviewed and is understood.  Full responsibility of the confidentiality of this discharge information lies with you and/or your care-partner.

## 2018-11-21 NOTE — Progress Notes (Signed)
Report given to PACU, vss 

## 2018-11-22 ENCOUNTER — Telehealth: Payer: Self-pay

## 2018-11-22 NOTE — Telephone Encounter (Signed)
  Follow up Call-  Call back number 11/21/2018 08/13/2016 07/02/2016  Post procedure Call Back phone  # [phone]  Permission to leave phone message Yes Yes Yes  Some recent data might be hidden     Patient questions:  Do you have a fever, pain , or abdominal swelling? No. Pain Score  0 *  Have you tolerated food without any problems? Yes.    Have you been able to return to your normal activities? Yes.    Do you have any questions about your discharge instructions: Diet   No. Medications  No. Follow up visit  No.  Do you have questions or concerns about your Care? No.  Actions: * If pain score is 4 or above: No action needed, pain <4.

## 2018-11-23 LAB — HM COLONOSCOPY

## 2018-11-28 ENCOUNTER — Ambulatory Visit: Payer: Medicare HMO | Admitting: Endocrinology

## 2018-12-04 ENCOUNTER — Ambulatory Visit (INDEPENDENT_AMBULATORY_CARE_PROVIDER_SITE_OTHER): Payer: Medicare HMO | Admitting: Endocrinology

## 2018-12-04 ENCOUNTER — Other Ambulatory Visit: Payer: Self-pay

## 2018-12-04 ENCOUNTER — Encounter: Payer: Self-pay | Admitting: Endocrinology

## 2018-12-04 VITALS — BP 122/70 | HR 72 | Ht 68.5 in | Wt 194.6 lb

## 2018-12-04 DIAGNOSIS — Z794 Long term (current) use of insulin: Secondary | ICD-10-CM | POA: Diagnosis not present

## 2018-12-04 DIAGNOSIS — E1165 Type 2 diabetes mellitus with hyperglycemia: Secondary | ICD-10-CM | POA: Diagnosis not present

## 2018-12-04 LAB — GLUCOSE, POCT (MANUAL RESULT ENTRY): POC Glucose: 160 mg/dl — AB (ref 70–99)

## 2018-12-04 NOTE — Progress Notes (Signed)
Patient ID: Charles Parker, male   DOB: [birth year], 73 y.o.   MRN: [id number]          Reason for Appointment: Follow-up for Type 2 Diabetes  Referring physician: Scarlette Calico   History of Present Illness:          Date of diagnosis of type 2 diabetes mellitus: 2014        Background history:   He has been on various oral hypoglycemic drugs since the onset including metformin, Januvia and Amaryl Was also on Invokana in 2015 for about a year but not clear why this was stopped His level of control has been quite variable with highest A1c 13.6 in 06/2015  He has been on insulin since about 10/2014 as documented in his record  Recent history:   INSULIN regimen is:  NovoLog mix 30 units before breakfast and 20 units before dinner  Non-insulin hypoglycemic drugs the patient is taking WUJ:WJXBJYN 10 mg daily  His A1c is last 8.3   Current management, blood sugar patterns and problems identified:  He is now taking premixed insulin twice a day because of difficulty with complying with 2 separate types of insulins previously and difficulty getting consistent history about how much he would be taking  He did bring his monitor for download today and has done 7 readings in the last month  However no consistent pattern seen  He has mostly high readings but still has one good reading at breakfast and 1 reading at dinnertime high  Does not check blood sugars after dinner  He thinks he is compliant with his insulin regimen twice a day  Although he has had a couple of readings over 200 about 4--5 PM today after lunch in the office his blood sugar is still  No hypoglycemia  No recent change in his weight  He thinks he is feeling fairly good and with using a brace he is able to move around better with his leg   Side effects from medications have been: Hives from metformin  Compliance with the medical regimen: Inconsistent  Glucose monitoring:  done< 1 times a   day usually         Glucometer: Accu-Chek guide      Blood Glucose readings by Download  FASTING 119-222 with average 169  Before dinner 127-216 with average 180  Self-care: The diet that the patient has been following is: tries to limit [employer] .      Typical meal intake: Breakfast is eggs/cereal or oatmeal usually, lunch: Cold cut sandwiches.  Dinner chicken and vegetables.   Snacks will be chips, peanut butter crackers Supper at 6 pm                Dietician visit, most recent:3/19               Exercise: some walking around his apartment area  Weight history:  Wt Readings from Last 3 Encounters:  12/04/18 194 lb 9.6 oz (88.3 kg)  11/21/18 193 lb (87.5 kg)  11/09/18 193 lb 6 oz (87.7 kg)    Glycemic control:   Lab Results  Component Value Date   HGBA1C 8.3 (H) 10/09/2018   HGBA1C 7.2 (H) 07/07/2018   HGBA1C 8.2 (H) 03/03/2018   Lab Results  Component Value Date   MICROALBUR <0.7 03/03/2018   LDLCALC 91 03/03/2018   CREATININE 1.13 10/09/2018   Lab Results  Component Value Date   MICRALBCREAT 1.4 03/03/2018    Lab Results  Component Value Date   FRUCTOSAMINE 330 (H) 05/05/2018   FRUCTOSAMINE 331 (H) 12/29/2017   FRUCTOSAMINE 334 (H) 08/05/2017      Allergies as of 12/04/2018      Reactions   Metformin And Related Diarrhea   Codeine Rash   All over the body      Medication List       Accurate as of December 04, 2018 11:59 PM. Always use your most recent med list.        Alcohol Swabs Pads Use to clean area before insulin injections. DX E11.8   Azilsartan-Chlorthalidone 40-12.5 MG Tabs Commonly known as:  EDARBYCLOR Take 1 tablet by mouth daily.   Cholecalciferol 50 MCG (2000 UT) Tabs Take 1 tablet (2,000 Units total) by mouth daily.   dapagliflozin propanediol 10 MG Tabs tablet Commonly known as:  FARXIGA Take 10 mg by mouth daily.   enoxaparin 120 MG/0.8ML injection Commonly known as:  LOVENOX Inject 0.8 mLs (120 mg total) into the skin daily.     ferrous sulfate 325 (65 FE) MG tablet Take 1 tablet (325 mg total) by mouth 2 (two) times daily with a meal.   gabapentin 300 MG capsule Commonly known as:  NEURONTIN TAKE 1 CAPSULE(300 MG) BY MOUTH TWICE DAILY   glucose blood test strip Commonly known as:  ACCU-CHEK GUIDE 1 each by Other route 3 (three) times daily. Use as instructed   insulin aspart protamine- aspart (70-30) 100 UNIT/ML injection Commonly known as:  NOVOLOG MIX 70/30 Inject into the skin. 30 units before breakfast and 20 units before supper   Insulin Pen Needle 31G X 5 MM Misc Commonly known as:  EASY TOUCH PEN NEEDLES ADMINISTER LANTUS ONCE EVERY EVENING   multivitamin with minerals Tabs tablet Take 1 tablet by mouth daily.   oxybutynin 5 MG tablet Commonly known as:  DITROPAN TAKE 1 TABLET EVERY DAY (SUBSTITUTED FOR DITROPAN)   rivaroxaban 20 MG Tabs tablet Commonly known as:  XARELTO Take 1 tablet (20 mg total) by mouth daily with supper.   simvastatin 40 MG tablet Commonly known as:  ZOCOR TAKE 1 TABLET(40 MG) BY MOUTH AT BEDTIME   tamsulosin 0.4 MG Caps capsule Commonly known as:  FLOMAX TAKE 1 CAPSULE(0.4 MG) BY MOUTH DAILY AFTER BREAKFAST   TRUEPLUS LANCETS 33G Misc USE TO CHECK BLOOD SUGARS THREE TIMES DAY       Allergies:  Allergies  Allergen Reactions  . Metformin And Related Diarrhea  . Codeine Rash    All over the body    Past Medical History:  Diagnosis Date  . Clotting disorder (HCC)    DVT  . Diabetes mellitus    type II  . Diverticulosis   . DVT (deep venous thrombosis) ([place])   . Glaucoma    no eye drops   . Hepatic cyst   . Hypercholesterolemia   . Hypertension   . Renal cyst   . Sleep apnea   . Tubulovillous adenoma     Past Surgical History:  Procedure Laterality Date  . BLADDER NECK RECONSTRUCTION  02/03/2012   Procedure: BLADDER NECK REPAIR;  Surgeon: Adin Hector, MD;  Location: [facility];  Service: General;;  . COLONOSCOPY  02/2013   polyp  removal(mult.)  . KNEE SURGERY  2004   left  . POLYPECTOMY    . PROCTOSCOPY  02/03/2012   Procedure: PROCTOSCOPY;  Surgeon: Adin Hector, MD;  Location: [facility];  Service: General;;  . STOMACH SURGERY  02/2012  .  TEE WITHOUT CARDIOVERSION N/A 10/05/2016   Procedure: TRANSESOPHAGEAL ECHOCARDIOGRAM (TEE);  Surgeon: Sanda Klein, MD;  Location: [facility];  Service: Cardiovascular;  Laterality: N/A;    Family History  Problem Relation Age of Onset  . Hypertension Mother   . Diabetes Mother   . Cancer Mother        ? location  . Colon cancer Father 84  . Colon polyps Neg Hx   . Liver cancer Neg Hx     Social History:  reports that he has never smoked. He has never used smokeless tobacco. He reports that he does not drink alcohol or use drugs.   Review of Systems   Lipid history: Has been taking Zocor 40 mg with good control, followed by PCP    Lab Results  Component Value Date   CHOL 165 03/03/2018   HDL 39.20 03/03/2018   LDLCALC 91 03/03/2018   LDLDIRECT 118.0 11/11/2015   TRIG 172.0 (H) 03/03/2018   CHOLHDL 4 03/03/2018            Most recent foot exam: 10/19  RENAL function is stable with using Farxiga 10 mg  Lab Results  Component Value Date   CREATININE 1.13 10/09/2018   BUN 16 10/09/2018   NA 137 10/09/2018   K 3.8 10/09/2018   CL 105 10/09/2018   CO2 24 10/09/2018   His blood pressure is today improved, followed by PCP  BP Readings from Last 3 Encounters:  12/04/18 122/70  11/21/18 (!) 126/99  11/09/18 108/76     LABS:  Office Visit on 12/04/2018  Component Date Value Ref Range Status  . POC Glucose 12/04/2018 160* 70 - 99 mg/dl Final    Physical Examination:  BP 122/70 (BP Location: Left Arm, Patient Position: Sitting, Cuff Size: Normal)   Pulse 72   Ht 5' 8.5" (1.74 m)   Wt 194 lb 9.6 oz (88.3 kg)   SpO2 99%   BMI 29.16 kg/m          ASSESSMENT:  Diabetes type 2, recent BMI 27  See history of present illness for detailed  discussion of current diabetes management, blood sugar patterns and problems identified   He is on a regimen of premixed insulin and Farxiga  Previously with taking [place] and NovoLog his A1c had gone up to 8.3 and sporadic high readings over 300 Likely is doing much better with compliance with his insulin and is able to follow instructions better with using only one kind of insulin twice a day Although he is not checking very much his blood sugars are mildly increased overall No consistent pattern  HYPERTENSION: Appears better controlled  PLAN:    No change in insulin dose  Advised him to check more readings consistently including after supper  New prescription for [place] has been sent  Patient Instructions  Check blood sugars on waking up 3 days a week  Also check blood sugars about 2 hours after meals and do this after different meals by rotation  Recommended blood sugar levels on waking up are 90-130 and about 2 hours after meal is 130-160  Please bring your blood sugar monitor to each visit, thank you        Elayne Snare 12/05/2018, 12:58 PM   Note: This office note was prepared with Dragon voice recognition system technology. Any transcriptional errors that result from this process are unintentional.

## 2018-12-04 NOTE — Patient Instructions (Addendum)
Check blood sugars on waking up 3 days a week  Also check blood sugars about 2 hours after meals and do this after different meals by rotation  Recommended blood sugar levels on waking up are 90-130 and about 2 hours after meal is 130-160  Please bring your blood sugar monitor to each visit, thank you   

## 2018-12-05 MED ORDER — DAPAGLIFLOZIN PROPANEDIOL 10 MG PO TABS: 10.0000 mg | ORAL_TABLET | Freq: Every day | ORAL | 1 refills | Status: DC

## 2018-12-06 ENCOUNTER — Other Ambulatory Visit: Payer: Self-pay | Admitting: Internal Medicine

## 2018-12-06 DIAGNOSIS — N3281 Overactive bladder: Secondary | ICD-10-CM

## 2018-12-06 DIAGNOSIS — I824Y1 Acute embolism and thrombosis of unspecified deep veins of right proximal lower extremity: Secondary | ICD-10-CM

## 2019-03-05 ENCOUNTER — Other Ambulatory Visit (INDEPENDENT_AMBULATORY_CARE_PROVIDER_SITE_OTHER): Payer: Medicare HMO

## 2019-03-05 ENCOUNTER — Other Ambulatory Visit: Payer: Self-pay

## 2019-03-05 DIAGNOSIS — Z794 Long term (current) use of insulin: Secondary | ICD-10-CM

## 2019-03-05 DIAGNOSIS — E1165 Type 2 diabetes mellitus with hyperglycemia: Secondary | ICD-10-CM

## 2019-03-05 LAB — COMPREHENSIVE METABOLIC PANEL
ALT: 24 U/L (ref 0–53)
AST: 18 U/L (ref 0–37)
Albumin: 4.4 g/dL (ref 3.5–5.2)
Alkaline Phosphatase: 65 U/L (ref 39–117)
BUN: 14 mg/dL (ref 6–23)
CO2: 23 mEq/L (ref 19–32)
Calcium: 9.7 mg/dL (ref 8.4–10.5)
Chloride: 104 mEq/L (ref 96–112)
Creatinine, Ser: 1.1 mg/dL (ref 0.40–1.50)
GFR: 79.39 mL/min (ref >60.00)
Glucose, Bld: 154 mg/dL — ABNORMAL HIGH (ref 70–99)
Potassium: 3.9 mEq/L (ref 3.5–5.1)
Sodium: 139 mEq/L (ref 135–145)
Total Bilirubin: 0.6 mg/dL (ref 0.2–1.2)
Total Protein: 7.5 g/dL (ref 6.0–8.3)

## 2019-03-05 LAB — LIPID PANEL
Cholesterol: 195 mg/dL (ref 0–200)
HDL: 46.4 mg/dL (ref >39.00)
LDL Cholesterol: 119 mg/dL — ABNORMAL HIGH (ref 0–99)
NonHDL: 149.05
Total CHOL/HDL Ratio: 4
Triglycerides: 152 mg/dL — ABNORMAL HIGH (ref 0.0–149.0)
VLDL: 30.4 mg/dL (ref 0.0–40.0)

## 2019-03-05 LAB — MICROALBUMIN / CREATININE URINE RATIO
Creatinine,U: 103.3 mg/dL
Microalb Creat Ratio: 0.7 mg/g (ref 0.0–30.0)
Microalb, Ur: <0.7 mg/dL (ref 0.0–1.9)

## 2019-03-05 LAB — HEMOGLOBIN A1C: Hgb A1c MFr Bld: 9.5 % — ABNORMAL HIGH (ref 4.6–6.5)

## 2019-03-06 LAB — FRUCTOSAMINE: Fructosamine: 401 umol/L — ABNORMAL HIGH (ref 0–285)

## 2019-03-08 ENCOUNTER — Ambulatory Visit: Payer: Medicare HMO | Admitting: Endocrinology

## 2019-03-16 ENCOUNTER — Telehealth: Payer: Self-pay | Admitting: Internal Medicine

## 2019-03-16 NOTE — Telephone Encounter (Signed)
Routing to dr jones, please advise, thanks 

## 2019-03-16 NOTE — Telephone Encounter (Signed)
Patient came by the office requesting medication refills on the following: gabapentin (NEURONTIN) 300 MG capsule XARELTO 20 MG TABS tablet oxybutynin (DITROPAN) 5 MG tablet tamsulosin (FLOMAX) 0.4 MG CAPS capsule dapagliflozin propanediol (FARXIGA) 10 MG TABS tablet simvastatin (ZOCOR) 40 MG tablet tamsulosin (FLOMAX) 0.4 MG CAPS capsule  Pharmacy - [facility]: [address]  Patient is scheduled for a CPE on 03/29/2019 with Dr [name].

## 2019-03-19 ENCOUNTER — Other Ambulatory Visit: Payer: Self-pay | Admitting: Internal Medicine

## 2019-03-19 DIAGNOSIS — N401 Enlarged prostate with lower urinary tract symptoms: Secondary | ICD-10-CM

## 2019-03-19 DIAGNOSIS — E1140 Type 2 diabetes mellitus with diabetic neuropathy, unspecified: Secondary | ICD-10-CM

## 2019-03-19 DIAGNOSIS — N3281 Overactive bladder: Secondary | ICD-10-CM

## 2019-03-19 DIAGNOSIS — E1165 Type 2 diabetes mellitus with hyperglycemia: Secondary | ICD-10-CM

## 2019-03-19 DIAGNOSIS — Z794 Long term (current) use of insulin: Secondary | ICD-10-CM

## 2019-03-19 DIAGNOSIS — E113319 Type 2 diabetes mellitus with moderate nonproliferative diabetic retinopathy with macular edema, unspecified eye: Secondary | ICD-10-CM

## 2019-03-19 DIAGNOSIS — E785 Hyperlipidemia, unspecified: Secondary | ICD-10-CM

## 2019-03-19 DIAGNOSIS — I824Y1 Acute embolism and thrombosis of unspecified deep veins of right proximal lower extremity: Secondary | ICD-10-CM

## 2019-03-19 MED ORDER — GABAPENTIN 300 MG PO CAPS: ORAL_CAPSULE | ORAL | 0 refills | Status: DC

## 2019-03-19 MED ORDER — OXYBUTYNIN CHLORIDE 5 MG PO TABS: ORAL_TABLET | ORAL | 0 refills | Status: DC

## 2019-03-19 MED ORDER — SIMVASTATIN 40 MG PO TABS: ORAL_TABLET | ORAL | 0 refills | Status: DC

## 2019-03-19 MED ORDER — FARXIGA 10 MG PO TABS: 10.0000 mg | ORAL_TABLET | Freq: Every day | ORAL | 0 refills | Status: DC

## 2019-03-19 MED ORDER — TAMSULOSIN HCL 0.4 MG PO CAPS: ORAL_CAPSULE | ORAL | 0 refills | Status: DC

## 2019-03-19 MED ORDER — RIVAROXABAN 20 MG PO TABS: ORAL_TABLET | ORAL | 0 refills | Status: DC

## 2019-03-27 ENCOUNTER — Encounter: Payer: Self-pay | Admitting: Endocrinology

## 2019-03-27 ENCOUNTER — Ambulatory Visit (INDEPENDENT_AMBULATORY_CARE_PROVIDER_SITE_OTHER): Payer: Medicare HMO | Admitting: Endocrinology

## 2019-03-27 ENCOUNTER — Other Ambulatory Visit: Payer: Self-pay

## 2019-03-27 VITALS — BP 120/80 | HR 94 | Ht 68.5 in | Wt 192.6 lb

## 2019-03-27 DIAGNOSIS — Z794 Long term (current) use of insulin: Secondary | ICD-10-CM

## 2019-03-27 DIAGNOSIS — E1165 Type 2 diabetes mellitus with hyperglycemia: Secondary | ICD-10-CM

## 2019-03-27 LAB — GLUCOSE, POCT (MANUAL RESULT ENTRY): POC Glucose: 266 mg/dl — AB (ref 70–99)

## 2019-03-27 NOTE — Patient Instructions (Addendum)
INSULIN IN AM IS 34 IN AM AND 26 AT SUPPER (before eating)  Check blood sugars on waking up days a week  Also check blood sugars about 2 hours after meals and do this after different meals by rotation  Recommended blood sugar levels on waking up are 90-130 and about 2 hours after meal is 130-180  Please bring your blood sugar monitor to each visit, thank you

## 2019-03-27 NOTE — Progress Notes (Signed)
Patient ID: Charles Parker, male   DOB: 03/07/1946, 73 y.o.   MRN: [id number]          Reason for Appointment: Follow-up for Type 2 Diabetes  Referring physician: Scarlette Calico   History of Present Illness:          Date of diagnosis of type 2 diabetes mellitus: 2014        Background history:   He has been on various oral hypoglycemic drugs since the onset including metformin, Januvia and Amaryl Was also on Invokana in 2015 for about a year but not clear why this was stopped His level of control has been quite variable with highest A1c 13.6 in 06/2015  He has been on insulin since about 10/2014 as documented in his record  Recent history:   INSULIN regimen is:  NovoLog mix 30 units before breakfast and 20 units before dinner  Non-insulin hypoglycemic drugs the patient is taking KYH:CWCBJSE 10 mg daily  His A1c is now 9.5, previously 8.3   Current management, blood sugar patterns and problems identified:  He is stating that he has not been watching his diet because of eating out at various get-togethers recently and probably not eating as well as usual  Despite reminders he still does not take his evening insulin before eating and later at night  He showed me how much he is dialing up on his morning insulin and appears to be measuring it correctly  He does not think he has been out of his insulin at any time  However not clear why his blood sugars are higher  He also thinks that he is taking his [place] regularly without running out  Has checked only one blood sugar at home this month   Side effects from medications have been: Hives from metformin  Compliance with the medical regimen: Inconsistent  Glucose monitoring:  done< 1 times a   day usually        Glucometer: Accu-Chek guide      Blood Glucose readings by Download  Glucose 279 at 7 PM  Self-care: The diet that the patient has been following is: tries to limit [employer] .      Typical meal intake:  Breakfast is eggs/cereal or oatmeal usually, lunch: Cold cut sandwiches.  Dinner chicken and vegetables.   Snacks will be chips, peanut butter crackers Supper at 6 pm                Dietician visit, most recent:3/19               Exercise: walking around his apartment area  Weight history:  Wt Readings from Last 3 Encounters:  03/27/19 192 lb 9.6 oz (87.4 kg)  12/04/18 194 lb 9.6 oz (88.3 kg)  11/21/18 193 lb (87.5 kg)    Glycemic control:   Lab Results  Component Value Date   HGBA1C 9.5 (H) 03/05/2019   HGBA1C 8.3 (H) 10/09/2018   HGBA1C 7.2 (H) 07/07/2018   Lab Results  Component Value Date   MICROALBUR <0.7 03/05/2019   LDLCALC 119 (H) 03/05/2019   CREATININE 1.10 03/05/2019   Lab Results  Component Value Date   MICRALBCREAT 0.7 03/05/2019    Lab Results  Component Value Date   FRUCTOSAMINE 401 (H) 03/05/2019   FRUCTOSAMINE 330 (H) 05/05/2018   FRUCTOSAMINE 331 (H) 12/29/2017      Allergies as of 03/27/2019      Reactions   Metformin And Related Diarrhea   Codeine  Rash   All over the body      Medication List       Accurate as of March 27, 2019 11:17 AM. If you have any questions, ask your nurse or doctor.        Alcohol Swabs Pads Use to clean area before insulin injections. DX E11.8   Azilsartan-Chlorthalidone 40-12.5 MG Tabs Commonly known as: Edarbyclor Take 1 tablet by mouth daily.   Cholecalciferol 50 MCG (2000 UT) Tabs Take 1 tablet (2,000 Units total) by mouth daily.   Farxiga 10 MG Tabs tablet Generic drug: dapagliflozin propanediol Take 10 mg by mouth daily.   ferrous sulfate 325 (65 FE) MG tablet Take 1 tablet (325 mg total) by mouth 2 (two) times daily with a meal.   gabapentin 300 MG capsule Commonly known as: NEURONTIN TAKE 1 CAPSULE(300 MG) BY MOUTH TWICE DAILY   glucose blood test strip Commonly known as: Accu-Chek Guide 1 each by Other route 3 (three) times daily. Use as instructed   insulin aspart protamine- aspart  (70-30) 100 UNIT/ML injection Commonly known as: NOVOLOG MIX 70/30 Inject into the skin. 30 units before breakfast and 20 units before supper   Insulin Pen Needle 31G X 5 MM Misc Commonly known as: Easy Touch Pen Needles ADMINISTER LANTUS ONCE EVERY EVENING   multivitamin with minerals Tabs tablet Take 1 tablet by mouth daily.   oxybutynin 5 MG tablet Commonly known as: DITROPAN TAKE 1 TABLET BY MOUTH EVERY DAY   rivaroxaban 20 MG Tabs tablet Commonly known as: Xarelto TAKE 1 TABLET(20 MG) BY MOUTH DAILY WITH SUPPER   simvastatin 40 MG tablet Commonly known as: ZOCOR TAKE 1 TABLET(40 MG) BY MOUTH AT BEDTIME   tamsulosin 0.4 MG Caps capsule Commonly known as: FLOMAX TAKE 1 CAPSULE(0.4 MG) BY MOUTH DAILY AFTER BREAKFAST   TRUEplus Lancets 33G Misc USE TO CHECK BLOOD SUGARS THREE TIMES DAY       Allergies:  Allergies  Allergen Reactions  . Metformin And Related Diarrhea  . Codeine Rash    All over the body    Past Medical History:  Diagnosis Date  . Clotting disorder (HCC)    DVT  . Diabetes mellitus    type II  . Diverticulosis   . DVT (deep venous thrombosis) ([place])   . Glaucoma    no eye drops   . Hepatic cyst   . Hypercholesterolemia   . Hypertension   . Renal cyst   . Sleep apnea   . Tubulovillous adenoma     Past Surgical History:  Procedure Laterality Date  . BLADDER NECK RECONSTRUCTION  02/03/2012   Procedure: BLADDER NECK REPAIR;  Surgeon: Adin Hector, MD;  Location: [facility]S;  Service: General;;  . COLONOSCOPY  02/2013   polyp removal(mult.)  . KNEE SURGERY  2004   left  . POLYPECTOMY    . PROCTOSCOPY  02/03/2012   Procedure: PROCTOSCOPY;  Surgeon: Adin Hector, MD;  Location: [facility];  Service: General;;  . STOMACH SURGERY  02/2012  . TEE WITHOUT CARDIOVERSION N/A 10/05/2016   Procedure: TRANSESOPHAGEAL ECHOCARDIOGRAM (TEE);  Surgeon: Sanda Klein, MD;  Location: [facility];  Service: Cardiovascular;  Laterality: N/A;    Family History   Problem Relation Age of Onset  . Hypertension Mother   . Diabetes Mother   . Cancer Mother        ? location  . Colon cancer Father 49  . Colon polyps Neg Hx   . Liver cancer Neg  Hx     Social History:  reports that he has never smoked. He has never used smokeless tobacco. He reports that he does not drink alcohol or use drugs.   Review of Systems   Lipid history: Has been taking Zocor 40 mg with good control, followed by PCP He says he is taking this regularly but LDL is higher    Lab Results  Component Value Date   CHOL 195 03/05/2019   CHOL 165 03/03/2018   CHOL 161 12/15/2016   Lab Results  Component Value Date   HDL 46.40 03/05/2019   HDL 39.20 03/03/2018   HDL 52.10 12/15/2016   Lab Results  Component Value Date   LDLCALC 119 (H) 03/05/2019   LDLCALC 91 03/03/2018   LDLCALC 87 12/15/2016   Lab Results  Component Value Date   TRIG 152.0 (H) 03/05/2019   TRIG 172.0 (H) 03/03/2018   TRIG 108.0 12/15/2016   Lab Results  Component Value Date   CHOLHDL 4 03/05/2019   CHOLHDL 4 03/03/2018   CHOLHDL 3 12/15/2016   Lab Results  Component Value Date   LDLDIRECT 118.0 11/11/2015   LDLDIRECT 201.0 03/04/2015   LDLDIRECT 221.4 07/10/2014             Most recent foot exam: 10/19  RENAL function is stable with using Farxiga 10 mg  Lab Results  Component Value Date   CREATININE 1.10 03/05/2019   CREATININE 1.13 10/09/2018   CREATININE 1.05 07/07/2018    His blood pressure is controlled, followed by PCP  BP Readings from Last 3 Encounters:  03/27/19 120/80  12/04/18 122/70  11/21/18 (!) 126/99     LABS:  Office Visit on 03/27/2019  Component Date Value Ref Range Status  . POC Glucose 03/27/2019 266* 70 - 99 mg/dl Final    Physical Examination:  BP 120/80 (BP Location: Left Arm, Patient Position: Sitting, Cuff Size: Normal)   Pulse 94   Ht 5' 8.5" (1.74 m)   Wt 192 lb 9.6 oz (87.4 kg)   SpO2 97%   BMI 28.86 kg/m           ASSESSMENT:  Diabetes type 2  See history of present illness for detailed discussion of current diabetes management, blood sugar patterns and problems identified   He is on a regimen of premixed insulin twice a day and Farxiga  His A1c is significantly higher at 9.5  Although he has had difficulty with compliance in the past he thinks he is taking his insulin as directed Without adequate glucose monitoring difficult to adjust his insulin doses  HYPERTENSION: Controlled  LIPIDS: Not adequately controlled and will defer management to PCP at his upcoming visit this week  PLAN:    Will increase insulin to 34 units in the morning and 26 at dinnertime  Emphasized the need to take suppertime dose before eating and not later at night  Continue Farxiga To start checking blood sugars regularly as instructed in his visit summary Follow-up in about 1 month  Patient Instructions  INSULIN IN AM IS 34 IN AM AND 26 AT SUPPER (before eating)  Check blood sugars on waking up days a week  Also check blood sugars about 2 hours after meals and do this after different meals by rotation  Recommended blood sugar levels on waking up are 90-130 and about 2 hours after meal is 130-180  Please bring your blood sugar monitor to each visit, thank you        Jalia Zuniga  [place] 03/27/2019, 11:17 AM   Note: This office note was prepared with Dragon voice recognition system technology. Any transcriptional errors that result from this process are unintentional.

## 2019-03-29 ENCOUNTER — Other Ambulatory Visit (INDEPENDENT_AMBULATORY_CARE_PROVIDER_SITE_OTHER): Payer: Medicare HMO

## 2019-03-29 ENCOUNTER — Encounter: Payer: Self-pay | Admitting: Internal Medicine

## 2019-03-29 ENCOUNTER — Ambulatory Visit (INDEPENDENT_AMBULATORY_CARE_PROVIDER_SITE_OTHER): Payer: Medicare HMO | Admitting: Internal Medicine

## 2019-03-29 ENCOUNTER — Other Ambulatory Visit: Payer: Self-pay

## 2019-03-29 VITALS — BP 130/82 | HR 92 | Temp 98.7°F | Ht 68.5 in | Wt 192.0 lb

## 2019-03-29 DIAGNOSIS — Z794 Long term (current) use of insulin: Secondary | ICD-10-CM

## 2019-03-29 DIAGNOSIS — E559 Vitamin D deficiency, unspecified: Secondary | ICD-10-CM

## 2019-03-29 DIAGNOSIS — I10 Essential (primary) hypertension: Secondary | ICD-10-CM | POA: Diagnosis not present

## 2019-03-29 DIAGNOSIS — E785 Hyperlipidemia, unspecified: Secondary | ICD-10-CM | POA: Diagnosis not present

## 2019-03-29 DIAGNOSIS — B3742 Candidal balanitis: Secondary | ICD-10-CM | POA: Insufficient documentation

## 2019-03-29 DIAGNOSIS — N521 Erectile dysfunction due to diseases classified elsewhere: Secondary | ICD-10-CM

## 2019-03-29 DIAGNOSIS — R3916 Straining to void: Secondary | ICD-10-CM

## 2019-03-29 DIAGNOSIS — E118 Type 2 diabetes mellitus with unspecified complications: Secondary | ICD-10-CM | POA: Diagnosis not present

## 2019-03-29 DIAGNOSIS — E1169 Type 2 diabetes mellitus with other specified complication: Secondary | ICD-10-CM | POA: Diagnosis not present

## 2019-03-29 DIAGNOSIS — N401 Enlarged prostate with lower urinary tract symptoms: Secondary | ICD-10-CM | POA: Diagnosis not present

## 2019-03-29 DIAGNOSIS — E113319 Type 2 diabetes mellitus with moderate nonproliferative diabetic retinopathy with macular edema, unspecified eye: Secondary | ICD-10-CM

## 2019-03-29 DIAGNOSIS — E611 Iron deficiency: Secondary | ICD-10-CM

## 2019-03-29 LAB — CBC WITH DIFFERENTIAL/PLATELET
Basophils Absolute: 0 10*3/uL (ref 0.0–0.1)
Basophils Relative: 1.2 % (ref 0.0–3.0)
Eosinophils Absolute: 0 10*3/uL (ref 0.0–0.7)
Eosinophils Relative: 0.8 % (ref 0.0–5.0)
HCT: 46.9 % (ref 39.0–52.0)
Hemoglobin: 15.8 g/dL (ref 13.0–17.0)
Lymphocytes Relative: 42.1 % (ref 12.0–46.0)
Lymphs Abs: 1.7 10*3/uL (ref 0.7–4.0)
MCHC: 33.6 g/dL (ref 30.0–36.0)
MCV: 93.5 fl (ref 78.0–100.0)
Monocytes Absolute: 0.4 10*3/uL (ref 0.1–1.0)
Monocytes Relative: 10.4 % (ref 3.0–12.0)
Neutro Abs: 1.8 10*3/uL (ref 1.4–7.7)
Neutrophils Relative %: 45.5 % (ref 43.0–77.0)
Platelets: 154 10*3/uL (ref 150.0–400.0)
RBC: 5.01 Mil/uL (ref 4.22–5.81)
RDW: 13.6 % (ref 11.5–15.5)
WBC: 4 10*3/uL (ref 4.0–10.5)

## 2019-03-29 LAB — FERRITIN: Ferritin: 830.5 ng/mL — ABNORMAL HIGH (ref 22.0–322.0)

## 2019-03-29 LAB — IBC PANEL
Iron: 101 ug/dL (ref 42–165)
Saturation Ratios: 28.4 % (ref 20.0–50.0)
Transferrin: 254 mg/dL (ref 212.0–360.0)

## 2019-03-29 LAB — VITAMIN D 25 HYDROXY (VIT D DEFICIENCY, FRACTURES): VITD: 37.61 ng/mL (ref 30.00–100.00)

## 2019-03-29 LAB — PSA: PSA: 2.42 ng/mL (ref 0.10–4.00)

## 2019-03-29 MED ORDER — FLUCONAZOLE 150 MG PO TABS: 150.0000 mg | ORAL_TABLET | ORAL | 1 refills | Status: DC

## 2019-03-29 MED ORDER — STENDRA 200 MG PO TABS: 1.0000 | ORAL_TABLET | ORAL | 5 refills | Status: DC

## 2019-03-29 NOTE — Progress Notes (Signed)
Subjective:  Patient ID: Charles Parker, male    DOB: [birth year]  Age: 73 y.o. MRN: [id number]  CC: Hypertension, Diabetes, and Hyperlipidemia   HPI RANDIE TALLARICO presents for f/up - He complains of urinary frequency and erectile dysfunction.  He has not done a good job of controlling his blood sugars recently.  He is active and denies any recent episodes of CP, DOE, palpitations, edema, or fatigue.  Outpatient Medications Prior to Visit  Medication Sig Dispense Refill  . Alcohol Swabs PADS Use to clean area before insulin injections. DX E11.8 200 each 3  . Azilsartan-Chlorthalidone (EDARBYCLOR) 40-12.5 MG TABS Take 1 tablet by mouth daily. 21 tablet 0  . Cholecalciferol 2000 units TABS Take 1 tablet (2,000 Units total) by mouth daily. 90 tablet 1  . dapagliflozin propanediol (FARXIGA) 10 MG TABS tablet Take 10 mg by mouth daily. 30 tablet 0  . ferrous sulfate 325 (65 FE) MG tablet Take 1 tablet (325 mg total) by mouth 2 (two) times daily with a meal. 180 tablet 1  . gabapentin (NEURONTIN) 300 MG capsule TAKE 1 CAPSULE(300 MG) BY MOUTH TWICE DAILY 60 capsule 0  . glucose blood (ACCU-CHEK GUIDE) test strip 1 each by Other route 3 (three) times daily. Use as instructed 100 each 12  . insulin aspart protamine- aspart (NOVOLOG MIX 70/30) (70-30) 100 UNIT/ML injection Inject into the skin. 30 units before breakfast and 20 units before supper    . Insulin Pen Needle (EASY TOUCH PEN NEEDLES) 31G X 5 MM MISC ADMINISTER LANTUS ONCE EVERY EVENING 100 each 3  . Multiple Vitamin (MULTIVITAMIN WITH MINERALS) TABS tablet Take 1 tablet by mouth daily.    Marland Kitchen oxybutynin (DITROPAN) 5 MG tablet TAKE 1 TABLET BY MOUTH EVERY DAY 30 tablet 0  . rivaroxaban (XARELTO) 20 MG TABS tablet TAKE 1 TABLET(20 MG) BY MOUTH DAILY WITH SUPPER 30 tablet 0  . tamsulosin (FLOMAX) 0.4 MG CAPS capsule TAKE 1 CAPSULE(0.4 MG) BY MOUTH DAILY AFTER BREAKFAST 30 capsule 0  . TRUEPLUS LANCETS 33G MISC USE TO CHECK BLOOD SUGARS  THREE TIMES DAY 300 each 1  . simvastatin (ZOCOR) 40 MG tablet TAKE 1 TABLET(40 MG) BY MOUTH AT BEDTIME 30 tablet 0   No facility-administered medications prior to visit.     ROS Review of Systems  Constitutional: Negative.  Negative for diaphoresis, fatigue and unexpected weight change.  HENT: Negative.  Negative for trouble swallowing.   Eyes: Negative for visual disturbance.  Respiratory: Negative.  Negative for cough, chest tightness, shortness of breath and wheezing.   Cardiovascular: Negative for chest pain, palpitations and leg swelling.  Gastrointestinal: Negative for abdominal pain, constipation, diarrhea and nausea.  Endocrine: Positive for polyuria. Negative for polydipsia and polyphagia.  Genitourinary: Positive for frequency. Negative for decreased urine volume, difficulty urinating, discharge, enuresis, flank pain, genital sores, hematuria, penile pain, penile swelling, scrotal swelling, testicular pain and urgency.       +ED  Musculoskeletal: Negative.  Negative for arthralgias and myalgias.  Skin: Negative for color change, pallor and rash.  Neurological: Negative.  Negative for dizziness, weakness and light-headedness.  Hematological: Negative for adenopathy. Does not bruise/bleed easily.  Psychiatric/Behavioral: Negative.     Objective:  BP 130/82 (BP Location: Left Arm, Patient Position: Sitting, Cuff Size: Large)   Pulse 92   Temp 98.7 F (37.1 C) (Oral)   Ht 5' 8.5" (1.74 m)   Wt 192 lb (87.1 kg)   SpO2 97%   BMI 28.77 kg/m  BP Readings from Last 3 Encounters:  03/29/19 130/82  03/27/19 120/80  12/04/18 122/70    Wt Readings from Last 3 Encounters:  03/29/19 192 lb (87.1 kg)  03/27/19 192 lb 9.6 oz (87.4 kg)  12/04/18 194 lb 9.6 oz (88.3 kg)    Physical Exam Vitals signs reviewed.  HENT:     Nose: Nose normal.     Mouth/Throat:     Mouth: Mucous membranes are moist.  Eyes:     General: No scleral icterus.    Conjunctiva/sclera: Conjunctivae  normal.  Neck:     Musculoskeletal: Normal range of motion. No muscular tenderness.  Cardiovascular:     Rate and Rhythm: Normal rate and regular rhythm.     Heart sounds: No murmur. No gallop.      Comments: EKG -   Sinus  Rhythm  WITHIN NORMAL LIMITS Pulmonary:     Effort: Pulmonary effort is normal.     Breath sounds: No stridor. No wheezing, rhonchi or rales.  Abdominal:     General: Abdomen is flat.     Palpations: There is no hepatomegaly, splenomegaly or mass.     Tenderness: There is no abdominal tenderness. There is no guarding.     Hernia: There is no hernia in the left inguinal area or right inguinal area.  Genitourinary:    Pubic Area: No rash.      Penis: Discharge present. No phimosis, paraphimosis, hypospadias, erythema, tenderness, swelling or lesions.      Scrotum/Testes: Normal.        Right: Mass, tenderness or swelling not present.        Left: Mass, tenderness or swelling not present.     Epididymis:     Right: Normal. Not inflamed or enlarged. No mass or tenderness.     Left: Normal. Not inflamed or enlarged. No mass or tenderness.     Prostate: Enlarged (2+ smooth symm BPH). Not tender and no nodules present.     Rectum: Normal. Guaiac result negative. No mass, tenderness, anal fissure, external hemorrhoid or internal hemorrhoid. Normal anal tone.     Comments: There is a scattered whitecheesy exudate coating the glans Musculoskeletal: Normal range of motion.     Right lower leg: No edema.     Left lower leg: No edema.  Lymphadenopathy:     Cervical: No cervical adenopathy.     Lower Body: No right inguinal adenopathy. No left inguinal adenopathy.  Skin:    General: Skin is warm and dry.     Coloration: Skin is not pale.  Neurological:     General: No focal deficit present.  Psychiatric:        Mood and Affect: Mood normal.        Behavior: Behavior normal.     Lab Results  Component Value Date   WBC 4.0 03/29/2019   HGB 15.8 03/29/2019   HCT  46.9 03/29/2019   PLT 154.0 03/29/2019   GLUCOSE 154 (H) 03/05/2019   CHOL 195 03/05/2019   TRIG 152.0 (H) 03/05/2019   HDL 46.40 03/05/2019   LDLDIRECT 118.0 11/11/2015   LDLCALC 119 (H) 03/05/2019   ALT 24 03/05/2019   AST 18 03/05/2019   NA 139 03/05/2019   K 3.9 03/05/2019   CL 104 03/05/2019   CREATININE 1.10 03/05/2019   BUN 14 03/05/2019   CO2 23 03/05/2019   TSH 0.67 09/08/2016   PSA 2.42 03/29/2019   INR 1.37 09/26/2016   HGBA1C 9.5 (H) 03/05/2019  MICROALBUR <0.7 03/05/2019    No results found.  Assessment & Plan:   Kiven was seen today for hypertension, diabetes and hyperlipidemia.  Diagnoses and all orders for this visit:  Benign prostatic hyperplasia (BPH) with straining on urination- His PSA is not rising which is reassuring that he does not have prostate cancer.  He has no symptoms that need to be treated. -     PSA; Future  Vitamin D insufficiency- His vitamin D level is normal now. -     VITAMIN D 25 Hydroxy (Vit-D Deficiency, Fractures); Future  Iron deficiency- His iron level is normal now. -     CBC with Differential/Platelet; Future -     IBC panel; Future -     Ferritin; Future  Type 2 diabetes mellitus with complication, with long-term current use of insulin ([place])- His blood sugars are not adequately well controlled.  He will continue to work diligently with his endocrinologist to gain better symptom control. -     Amb Referral to Nutrition and Diabetic E -     Consult to [place] Management -     HM Diabetes Foot Exam  Essential hypertension- His blood pressure is adequately well controlled.  Electrolytes and renal function are normal. -     EKG 12-Lead  Candidal balanitis -     fluconazole (DIFLUCAN) 150 MG tablet; Take 1 tablet (150 mg total) by mouth once a week.  Erectile dysfunction due to diabetes mellitus (HCC) -     Avanafil (STENDRA) 200 MG TABS; Take 1 tablet by mouth every 3 (three) days.  Type 2 diabetes mellitus with  moderate nonproliferative retinopathy and macular edema (HCC)  Hyperlipidemia with target LDL less than 70- He has achieved his LDL goal and is doing well on the statin. -     simvastatin (ZOCOR) 40 MG tablet; TAKE 1 TABLET(40 MG) BY MOUTH AT BEDTIME   I am having Brock Ra. Shelp "A.D." start on fluconazole and Stendra. I am also having him maintain his TRUEplus Lancets 33G, Alcohol Swabs, multivitamin with minerals, glucose blood, Insulin Pen Needle, Azilsartan-Chlorthalidone, Cholecalciferol, ferrous sulfate, insulin aspart protamine- aspart, rivaroxaban, oxybutynin, Farxiga, tamsulosin, gabapentin, and simvastatin.  Meds ordered this encounter  Medications  . fluconazole (DIFLUCAN) 150 MG tablet    Sig: Take 1 tablet (150 mg total) by mouth once a week.    Dispense:  4 tablet    Refill:  1  . Avanafil (STENDRA) 200 MG TABS    Sig: Take 1 tablet by mouth every 3 (three) days.    Dispense:  10 tablet    Refill:  5  . simvastatin (ZOCOR) 40 MG tablet    Sig: TAKE 1 TABLET(40 MG) BY MOUTH AT BEDTIME    Dispense:  90 tablet    Refill:  1     Follow-up: Return in about 6 months (around 09/28/2019).  Scarlette Calico, MD

## 2019-03-29 NOTE — Patient Instructions (Signed)

## 2019-03-30 ENCOUNTER — Encounter: Payer: Self-pay | Admitting: Internal Medicine

## 2019-03-30 ENCOUNTER — Other Ambulatory Visit: Payer: Self-pay | Admitting: *Deleted

## 2019-03-30 ENCOUNTER — Other Ambulatory Visit: Payer: Self-pay

## 2019-03-30 MED ORDER — SIMVASTATIN 40 MG PO TABS: ORAL_TABLET | ORAL | 1 refills | Status: DC

## 2019-03-30 NOTE — Patient Outreach (Signed)
[facility]) Care Management  03/30/2019  REEF ACHTERBERG 1945-10-22 [id number]   Referral Date: 03/29/2019 Referral Source: MD referral Referral Reason: Diabetes management   Outreach Attempt: spoke with patient. He is able to verify HIPAA.  Discussed reason for referral.  Patient states that his sugar has been up some and that he tries to do and eat the right things.  Discussed [facility] Care Management and support with patient.  He is agreeable to health coach for disease management.     Social: Patient lives alone but states he has the support of family.  Patient remains independent with all aspects of care per patient.   Conditions: Patient admits to diabetes, HTN and Hyperlipidemia.  Most recent increased to 9.5 and physician felt he needed additional support and education with his diabetes. Patient checks his sugar twice a day.  This mornings reading was 160 and last evening was 165.     Medications: Patient states he takes his medications as prescribed and denies any problems with medications.   Appointments:  Patient saw PCP on yesterday.   Advanced Directives: Patient does not have an advanced directive.   Plan: RN CM will refer patient to health coach for disease management of diabetes.     Jone Baseman, RN, MSN [facility] Care Management Care Management Coordinator Direct Line [phone] Toll Free: [phone]  Fax: [phone]

## 2019-04-03 ENCOUNTER — Other Ambulatory Visit: Payer: Self-pay

## 2019-04-03 DIAGNOSIS — E118 Type 2 diabetes mellitus with unspecified complications: Secondary | ICD-10-CM

## 2019-04-03 DIAGNOSIS — Z794 Long term (current) use of insulin: Secondary | ICD-10-CM

## 2019-04-03 MED ORDER — ACCU-CHEK GUIDE VI STRP: 1.0000 | ORAL_STRIP | Freq: Three times a day (TID) | 12 refills | Status: DC

## 2019-04-10 DIAGNOSIS — H2513 Age-related nuclear cataract, bilateral: Secondary | ICD-10-CM | POA: Diagnosis not present

## 2019-04-10 DIAGNOSIS — H1711 Central corneal opacity, right eye: Secondary | ICD-10-CM | POA: Diagnosis not present

## 2019-04-10 DIAGNOSIS — E119 Type 2 diabetes mellitus without complications: Secondary | ICD-10-CM | POA: Diagnosis not present

## 2019-04-10 DIAGNOSIS — H40013 Open angle with borderline findings, low risk, bilateral: Secondary | ICD-10-CM | POA: Diagnosis not present

## 2019-04-10 LAB — HM DIABETES EYE EXAM

## 2019-04-13 ENCOUNTER — Ambulatory Visit: Payer: Self-pay | Admitting: *Deleted

## 2019-04-18 ENCOUNTER — Other Ambulatory Visit: Payer: Self-pay | Admitting: *Deleted

## 2019-04-19 NOTE — Patient Outreach (Signed)
[place] [facility]) Care Management  [id number]  NADAV SWINDELL September 09, 1946 [phone]  RN Health Coach telephone call to patient.  [employer] compliance verified. Per patient his fasting blood sugar is 135. Patient A1C is 9.5. Patient states that he does not eat out much. Patient stated that he does have hypoglycemia episodes and will eat a piece of candy. Per patient when his blood sugar is elevated he just rests and go to sleep. Patient exercises by walking 2-3 miles a day. Patient stated that he has good support system from his brothers and daughters. Patient has agreed to further outreach calls.  Current Medications:  Current Outpatient Medications  Medication Sig Dispense Refill  . Alcohol Swabs PADS Use to clean area before insulin injections. DX E11.8 200 each 3  . Avanafil (STENDRA) 200 MG TABS Take 1 tablet by mouth every 3 (three) days. 10 tablet 5  . Azilsartan-Chlorthalidone (EDARBYCLOR) 40-12.5 MG TABS Take 1 tablet by mouth daily. 21 tablet 0  . Cholecalciferol 2000 units TABS Take 1 tablet (2,000 Units total) by mouth daily. 90 tablet 1  . dapagliflozin propanediol (FARXIGA) 10 MG TABS tablet Take 10 mg by mouth daily. 30 tablet 0  . ferrous sulfate 325 (65 FE) MG tablet Take 1 tablet (325 mg total) by mouth 2 (two) times daily with a meal. 180 tablet 1  . fluconazole (DIFLUCAN) 150 MG tablet Take 1 tablet (150 mg total) by mouth once a week. 4 tablet 1  . gabapentin (NEURONTIN) 300 MG capsule TAKE 1 CAPSULE(300 MG) BY MOUTH TWICE DAILY 60 capsule 0  . glucose blood (ACCU-CHEK GUIDE) test strip 1 each by Other route 3 (three) times daily. Use as instructed 100 each 12  . insulin aspart protamine- aspart (NOVOLOG MIX 70/30) (70-30) 100 UNIT/ML injection Inject into the skin. 30 units before breakfast and 20 units before supper    . Insulin Pen Needle (EASY TOUCH PEN NEEDLES) 31G X 5 MM MISC ADMINISTER LANTUS ONCE EVERY EVENING 100 each 3  . Multiple Vitamin (MULTIVITAMIN  WITH MINERALS) TABS tablet Take 1 tablet by mouth daily.    Marland Kitchen oxybutynin (DITROPAN) 5 MG tablet TAKE 1 TABLET BY MOUTH EVERY DAY 30 tablet 0  . rivaroxaban (XARELTO) 20 MG TABS tablet TAKE 1 TABLET(20 MG) BY MOUTH DAILY WITH SUPPER 30 tablet 0  . simvastatin (ZOCOR) 40 MG tablet TAKE 1 TABLET(40 MG) BY MOUTH AT BEDTIME 90 tablet 1  . tamsulosin (FLOMAX) 0.4 MG CAPS capsule TAKE 1 CAPSULE(0.4 MG) BY MOUTH DAILY AFTER BREAKFAST 30 capsule 0  . TRUEPLUS LANCETS 33G MISC USE TO CHECK BLOOD SUGARS THREE TIMES DAY 300 each 1   No current facility-administered medications for this visit.     Functional Status:  In your present state of health, do you have any difficulty performing the following activities: 04/18/2019  Hearing? N  Vision? N  Difficulty concentrating or making decisions? N  Walking or climbing stairs? N  Dressing or bathing? N  Doing errands, shopping? N  Preparing Food and eating ? N  Using the Toilet? N  In the past six months, have you accidently leaked urine? Y  Do you have problems with loss of bowel control? N  Managing your Medications? N  Managing your Finances? N  Housekeeping or managing your Housekeeping? N  Some recent data might be hidden    Fall/Depression Screening: Fall Risk  04/18/2019 03/29/2019 01/04/2018  Falls in the past year? 0 0 No  Number falls in past yr: (  No Data) 0 -  Comment none - -  Injury with Fall? - 0 -  Risk Factor Category  - - -  Risk for fall due to : - - Impaired balance/gait;Impaired mobility  Follow up - - -   PHQ 2/9 Scores 04/18/2019 03/30/2019 03/29/2019 01/04/2018 12/19/2017 07/05/2017 03/24/2017  PHQ - 2 Score 0 0 0 1 0 0 0  PHQ- 9 Score - - - 3 - - -   THN CM Care Plan Problem One     Most Recent Value  Care Plan Problem One  Knowledge Deficit in Self Management of Diabetes  Role Documenting the Problem One  [place] for Problem One  Active  THN Long Term Goal   Patient will see a decrease in A1C from 9.5 within  the next 90 days  THN Long Term Goal Start Date  04/18/19  Interventions for Problem One Long Term Goal  RN discussed A1C 9.5 and Fasting blood sugar. RN sent patient a Living well with diabetes booklet. RN will follow up further discussion  THN CM Short Term Goal #1   Patient will verbalize have a better understanding of hyper and hypoglycemia within the next  30days  THN CM Short Term Goal #1 Start Date  04/18/19  Interventions for Short Term Goal #1  RN discussed the patient the patient signs and symptoms of hyper and hypoglycemia. RN  sent a picture facial sheet of hypo and hyperglycemia symptoms and action plan. RN will follow up with further discussion  THN CM Short Term Goal #2   Patient will verbalize understanding healthy snacks within the next 30 days  THN CM Short Term Goal #2 Start Date  04/18/19  Interventions for Short Term Goal #2  RN discussed healthy snacks to eat. RN Health Coach sent a list of low carb snacks. RN will follow up with further discussion      Assessment:  A1C 9.5 FBS 135 Patient is taking safety precautions during pandemic Patient will benefit from [place] telephonic outreach for education and support for diabetes self management.  Plan:  RN discussed safety precautions during pandemic RN discussed A1C RN discussed healthy snacks RN discussed hypo and hyperglycemia RN sent educational sheets on low carb snacks RN sent facial picture sheet on hypo and hyperglycemia RN sent Living well with Diabetes booklet RN sent barriers and assessment letter to PCP RN will follow up within the month of September  Piotr Christopher [place] Management [phone]

## 2019-04-30 ENCOUNTER — Other Ambulatory Visit: Payer: Self-pay

## 2019-04-30 ENCOUNTER — Telehealth: Payer: Self-pay | Admitting: Endocrinology

## 2019-04-30 ENCOUNTER — Other Ambulatory Visit (INDEPENDENT_AMBULATORY_CARE_PROVIDER_SITE_OTHER): Payer: Medicare HMO

## 2019-04-30 DIAGNOSIS — Z794 Long term (current) use of insulin: Secondary | ICD-10-CM

## 2019-04-30 DIAGNOSIS — E1165 Type 2 diabetes mellitus with hyperglycemia: Secondary | ICD-10-CM | POA: Diagnosis not present

## 2019-04-30 DIAGNOSIS — E118 Type 2 diabetes mellitus with unspecified complications: Secondary | ICD-10-CM

## 2019-04-30 LAB — BASIC METABOLIC PANEL
BUN: 19 mg/dL (ref 6–23)
CO2: 23 mEq/L (ref 19–32)
Calcium: 9.8 mg/dL (ref 8.4–10.5)
Chloride: 104 mEq/L (ref 96–112)
Creatinine, Ser: 1.14 mg/dL (ref 0.40–1.50)
GFR: 76.16 mL/min (ref >60.00)
Glucose, Bld: 322 mg/dL — ABNORMAL HIGH (ref 70–99)
Potassium: 4.1 mEq/L (ref 3.5–5.1)
Sodium: 138 mEq/L (ref 135–145)

## 2019-04-30 MED ORDER — EASY TOUCH PEN NEEDLES 31G X 5 MM MISC: 3 refills | Status: DC

## 2019-04-30 MED ORDER — INSULIN ASPART PROT & ASPART (70-30 MIX) 100 UNIT/ML ~~LOC~~ SUSP: SUBCUTANEOUS | 3 refills | Status: DC

## 2019-04-30 NOTE — Telephone Encounter (Signed)
MEDICATION: insulin aspart protamine- aspart (NOVOLOG MIX 70/30) (70-30) 100 UNIT/ML injection AND Insulin Pen Needle (EASY TOUCH PEN NEEDLES) 31G X 5 MM MISC  PHARMACY:   [id number] - [place], [place] - [address] AT [address] [phone] (Phone) [phone] (Fax)    IS THIS A 90 DAY SUPPLY :  Yes  IS PATIENT OUT OF MEDICATION: Yes  IF NOT; HOW MUCH IS LEFT: 0  LAST APPOINTMENT DATE: @7 /14/2020  NEXT APPOINTMENT DATE:@7 /27/2020  DO WE HAVE YOUR PERMISSION TO LEAVE A DETAILED MESSAGE: Yes  OTHER COMMENTS:    **Let patient know to contact pharmacy at the end of the day to make sure medication is ready. **  ** Please notify patient to allow 48-72 hours to process**  **Encourage patient to contact the pharmacy for refills or they can request refills through [facility]**

## 2019-04-30 NOTE — Telephone Encounter (Signed)
Rx sent 

## 2019-05-01 LAB — FRUCTOSAMINE: Fructosamine: 337 umol/L — ABNORMAL HIGH (ref 0–285)

## 2019-05-02 ENCOUNTER — Ambulatory Visit (INDEPENDENT_AMBULATORY_CARE_PROVIDER_SITE_OTHER): Payer: Medicare HMO | Admitting: Endocrinology

## 2019-05-02 ENCOUNTER — Encounter: Payer: Self-pay | Admitting: Endocrinology

## 2019-05-02 ENCOUNTER — Other Ambulatory Visit: Payer: Self-pay

## 2019-05-02 VITALS — BP 128/80 | HR 85 | Temp 98.1°F | Ht 68.5 in | Wt 192.0 lb

## 2019-05-02 DIAGNOSIS — E7800 Pure hypercholesterolemia, unspecified: Secondary | ICD-10-CM | POA: Diagnosis not present

## 2019-05-02 DIAGNOSIS — Z794 Long term (current) use of insulin: Secondary | ICD-10-CM

## 2019-05-02 DIAGNOSIS — E1165 Type 2 diabetes mellitus with hyperglycemia: Secondary | ICD-10-CM | POA: Diagnosis not present

## 2019-05-02 NOTE — Patient Instructions (Addendum)
Check blood sugars on waking up days a week  Also check blood sugars about 2 hours after meals and do this after different meals by rotation  Recommended blood sugar levels on waking up are 90-130 and about 2 hours after meal is 130-160  Please bring your blood sugar monitor to each visit, thank you   MUST TAKE SUPPER SHOT BEFORE SUPPER  LEAN CUISINE or healthy Choice meals not Hungry Man More salads Vegs  INSULIN 34 IN AM AND 20 AT SUPPER

## 2019-05-02 NOTE — Progress Notes (Signed)
Patient ID: Charles Parker, male   DOB: [birth year], 73 y.o.   MRN: [id number]          Reason for Appointment: Follow-up for Type 2 Diabetes  Referring physician: Scarlette Calico   History of Present Illness:          Date of diagnosis of type 2 diabetes mellitus: 2014        Background history:   He has been on various oral hypoglycemic drugs since the onset including metformin, Januvia and Amaryl Was also on Invokana in 2015 for about a year but not clear why this was stopped His level of control has been quite variable with highest A1c 13.6 in 06/2015  He has been on insulin since about 10/2014 as documented in his record  Recent history:   INSULIN regimen is:  NovoLog mix 30 units before breakfast and 20 units before dinner  Non-insulin hypoglycemic drugs the patient is taking [id number] 10 mg daily  His A1c is last 9.5, previously 8.3 Fructosamine is 337, previously higher  Current management, blood sugar patterns and problems identified:  He is again not following instructions for his diabetes management  Is still taking the same dose of insulin even though he was told to increase his doses on the last visit  As before he is not timing his insulin properly also  He now says that he takes his evening premixed insulin an hour after eating dinner  His blood sugars have been well over 200 in June especially later in the day but last for 5 readings in the afternoon are mostly below 200 and as low as 134  However despite reminders he does not check his readings after meals  His fasting blood sugar has been high the last couple of days because of not refilling his insulin  He says that he is trying to cut back on eating out but his weight has not improved  He now says that he is eating the frozen hungry man dinners from the grocery store  No hypoglycemia reported   Side effects from medications have been: Hives from metformin  Compliance with the medical regimen:  Inconsistent  Glucose monitoring:  done< 1 times a   day usually        Glucometer: Accu-Chek guide      Blood Glucose readings by Download   PRE-MEAL Fasting Lunch Dinner Bedtime Overall  Glucose range:  136-206   134-260    Mean/median:  161   189  ?   POST-MEAL PC Breakfast PC Lunch PC Dinner  Glucose range:   ?  Mean/median:       Self-care: The diet that the patient has been following is: tries to limit [employer] .      Typical meal intake: Breakfast is eggs/cereal or oatmeal usually, lunch: Cold cut sandwiches.  Dinner chicken and vegetables.   Snacks will be chips, peanut butter crackers Supper at 6 pm                Dietician visit, most recent:3/19               Exercise: walking around his apartment area  Weight history:  Wt Readings from Last 3 Encounters:  05/02/19 192 lb (87.1 kg)  03/29/19 192 lb (87.1 kg)  03/27/19 192 lb 9.6 oz (87.4 kg)    Glycemic control:   Lab Results  Component Value Date   HGBA1C 9.5 (H) 03/05/2019   HGBA1C 8.3 (H) 10/09/2018  HGBA1C 7.2 (H) 07/07/2018   Lab Results  Component Value Date   MICROALBUR <0.7 03/05/2019   LDLCALC 119 (H) 03/05/2019   CREATININE 1.14 04/30/2019   Lab Results  Component Value Date   MICRALBCREAT 0.7 03/05/2019    Lab Results  Component Value Date   FRUCTOSAMINE 337 (H) 04/30/2019   FRUCTOSAMINE 401 (H) 03/05/2019   FRUCTOSAMINE 330 (H) 05/05/2018      Allergies as of 05/02/2019      Reactions   Metformin And Related Diarrhea   Codeine Rash   All over the body      Medication List       Accurate as of May 02, 2019  4:51 PM. If you have any questions, ask your nurse or doctor.        Accu-Chek Guide test strip Generic drug: glucose blood 1 each by Other route 3 (three) times daily. Use as instructed   Alcohol Swabs Pads Use to clean area before insulin injections. DX E11.8   Azilsartan-Chlorthalidone 40-12.5 MG Tabs Commonly known as: Edarbyclor Take 1 tablet by  mouth daily.   Cholecalciferol 50 MCG (2000 UT) Tabs Take 1 tablet (2,000 Units total) by mouth daily.   Easy Touch Pen Needles 31G X 5 MM Misc Generic drug: Insulin Pen Needle ADMINISTER LANTUS ONCE EVERY EVENING   Farxiga 10 MG Tabs tablet Generic drug: dapagliflozin propanediol Take 10 mg by mouth daily.   ferrous sulfate 325 (65 FE) MG tablet Take 1 tablet (325 mg total) by mouth 2 (two) times daily with a meal.   fluconazole 150 MG tablet Commonly known as: DIFLUCAN Take 1 tablet (150 mg total) by mouth once a week.   gabapentin 300 MG capsule Commonly known as: NEURONTIN TAKE 1 CAPSULE(300 MG) BY MOUTH TWICE DAILY   insulin aspart protamine- aspart (70-30) 100 UNIT/ML injection Commonly known as: NOVOLOG MIX 70/30 30 units before breakfast and 20 units before supper   multivitamin with minerals Tabs tablet Take 1 tablet by mouth daily.   oxybutynin 5 MG tablet Commonly known as: DITROPAN TAKE 1 TABLET BY MOUTH EVERY DAY   rivaroxaban 20 MG Tabs tablet Commonly known as: Xarelto TAKE 1 TABLET(20 MG) BY MOUTH DAILY WITH SUPPER   simvastatin 40 MG tablet Commonly known as: ZOCOR TAKE 1 TABLET(40 MG) BY MOUTH AT BEDTIME   Stendra 200 MG Tabs Generic drug: Avanafil Take 1 tablet by mouth every 3 (three) days.   tamsulosin 0.4 MG Caps capsule Commonly known as: FLOMAX TAKE 1 CAPSULE(0.4 MG) BY MOUTH DAILY AFTER BREAKFAST   TRUEplus Lancets 33G Misc USE TO CHECK BLOOD SUGARS THREE TIMES DAY       Allergies:  Allergies  Allergen Reactions  . Metformin And Related Diarrhea  . Codeine Rash    All over the body    Past Medical History:  Diagnosis Date  . Clotting disorder (HCC)    DVT  . Diabetes mellitus    type II  . Diverticulosis   . DVT (deep venous thrombosis) ([place])   . Glaucoma    no eye drops   . Hepatic cyst   . Hypercholesterolemia   . Hypertension   . Renal cyst   . Sleep apnea   . Tubulovillous adenoma     Past Surgical  History:  Procedure Laterality Date  . BLADDER NECK RECONSTRUCTION  02/03/2012   Procedure: BLADDER NECK REPAIR;  Surgeon: Adin Hector, MD;  Location: [facility] ORS;  Service: General;;  . COLONOSCOPY  02/2013  polyp removal(mult.)  . KNEE SURGERY  2004   left  . POLYPECTOMY    . PROCTOSCOPY  02/03/2012   Procedure: PROCTOSCOPY;  Surgeon: Adin Hector, MD;  Location: [facility]S;  Service: General;;  . STOMACH SURGERY  02/2012  . TEE WITHOUT CARDIOVERSION N/A 10/05/2016   Procedure: TRANSESOPHAGEAL ECHOCARDIOGRAM (TEE);  Surgeon: Sanda Klein, MD;  Location: [facility] ENDOSCOPY;  Service: Cardiovascular;  Laterality: N/A;    Family History  Problem Relation Age of Onset  . Hypertension Mother   . Diabetes Mother   . Cancer Mother        ? location  . Colon cancer Father 5  . Colon polyps Neg Hx   . Liver cancer Neg Hx     Social History:  reports that he has never smoked. He has never used smokeless tobacco. He reports that he does not drink alcohol or use drugs.   Review of Systems   Lipid history: Has been taking Zocor 40 mg with variable control, followed by PCP Last LDL is higher    Lab Results  Component Value Date   CHOL 195 03/05/2019   CHOL 165 03/03/2018   CHOL 161 12/15/2016   Lab Results  Component Value Date   HDL 46.40 03/05/2019   HDL 39.20 03/03/2018   HDL 52.10 12/15/2016   Lab Results  Component Value Date   LDLCALC 119 (H) 03/05/2019   LDLCALC 91 03/03/2018   LDLCALC 87 12/15/2016   Lab Results  Component Value Date   TRIG 152.0 (H) 03/05/2019   TRIG 172.0 (H) 03/03/2018   TRIG 108.0 12/15/2016   Lab Results  Component Value Date   CHOLHDL 4 03/05/2019   CHOLHDL 4 03/03/2018   CHOLHDL 3 12/15/2016   Lab Results  Component Value Date   LDLDIRECT 118.0 11/11/2015   LDLDIRECT 201.0 03/04/2015   LDLDIRECT 221.4 07/10/2014             Most recent foot exam: 10/19  RENAL function is stable with using Farxiga 10 mg  Lab Results  Component  Value Date   CREATININE 1.14 04/30/2019   CREATININE 1.10 03/05/2019   CREATININE 1.13 10/09/2018    His blood pressure is controlled, followed by PCP  BP Readings from Last 3 Encounters:  05/02/19 128/80  03/29/19 130/82  03/27/19 120/80     LABS:  Lab on 04/30/2019  Component Date Value Ref Range Status  . Fructosamine 04/30/2019 337* 0 - 285 umol/L Final   Comment: Published reference interval for apparently healthy subjects between age 69 and 75 is 15 - 285 umol/L and in a poorly controlled diabetic population is 228 - 563 umol/L with a mean of 396 umol/L.   Marland Kitchen Sodium 04/30/2019 138  135 - 145 mEq/L Final  . Potassium 04/30/2019 4.1  3.5 - 5.1 mEq/L Final  . Chloride 04/30/2019 104  96 - 112 mEq/L Final  . CO2 04/30/2019 23  19 - 32 mEq/L Final  . Glucose, Bld 04/30/2019 322* 70 - 99 mg/dL Final  . BUN 04/30/2019 19  6 - 23 mg/dL Final  . Creatinine, Ser 04/30/2019 1.14  0.40 - 1.50 mg/dL Final  . Calcium 04/30/2019 9.8  8.4 - 10.5 mg/dL Final  . GFR 04/30/2019 76.16  >60.00 mL/min Final    Physical Examination:  BP 128/80 (BP Location: Left Arm, Patient Position: Sitting, Cuff Size: Normal)   Pulse 85   Temp 98.1 F (36.7 C) (Oral)   Ht 5' 8.5" (1.74 m)  Wt 192 lb (87.1 kg)   SpO2 95%   BMI 28.77 kg/m          ASSESSMENT:  Diabetes type 2  See history of present illness for detailed discussion of current diabetes management, blood sugar patterns and problems identified   He is on a regimen of premixed insulin twice a day and Farxiga  His A1c is last higher at 9.5  However fructosamine of 337 indicates relatively better control  Most likely his diet is improving since his blood sugar control is somewhat better This is despite his not increasing his insulin doses as directed Also not taking his evening insulin before the meal and an hour after He still has difficulties understanding when to check his blood sugar and is doing it only randomly before  meals in the morning and evening Not able to do much exercise although he can try better  Still likely benefiting from [place] which he says he is taking regularly He was treated by his PCP for balanitis but has not complained about this recently  HYPERTENSION: Controlled    PLAN:    Will increase insulin to 34 units in the morning for now since most of his sugars later in the day are higher and continue 20 units before supper  However emphasized the need to take his evening dose before supper  He will need to make healthier choices when dining frozen dinners from the grocery store  Specific examples given  He needs to check some readings after supper or lunch periodically  Increase walking Check A1c on the next visit in 2 months  Patient Instructions  Check blood sugars on waking up days a week  Also check blood sugars about 2 hours after meals and do this after different meals by rotation  Recommended blood sugar levels on waking up are 90-130 and about 2 hours after meal is 130-160  Please bring your blood sugar monitor to each visit, thank you   MUST TAKE SUPPER SHOT BEFORE SUPPER  LEAN CUISINE or healthy Choice meals not Hungry Man More salads Vegs  INSULIN 34 IN AM AND 20 AT SUPPER      Elayne Snare 05/02/2019, 4:51 PM   Note: This office note was prepared with Dragon voice recognition system technology. Any transcriptional errors that result from this process are unintentional.

## 2019-05-24 ENCOUNTER — Encounter: Payer: Medicare HMO | Attending: Internal Medicine | Admitting: *Deleted

## 2019-05-24 ENCOUNTER — Other Ambulatory Visit: Payer: Self-pay

## 2019-05-24 DIAGNOSIS — E118 Type 2 diabetes mellitus with unspecified complications: Secondary | ICD-10-CM | POA: Diagnosis not present

## 2019-05-24 DIAGNOSIS — Z794 Long term (current) use of insulin: Secondary | ICD-10-CM

## 2019-05-24 NOTE — Patient Instructions (Signed)
Plan:   Aim for 2-3 Carb Choices per meal (30-45 grams)    Aim for 0-2 Carbs per snack if hungry   Include protein in moderation with your meals and snacks  Continue with your activity level by walking for 30-60 minutes daily as tolerated  Continue checking BG at alternate times per day   Continue taking medication as directed by MD.   Consider letting your MD know how much the [place] makes you urinate, sometimes causing accidents when you are away from home.

## 2019-05-24 NOTE — Progress Notes (Signed)
Diabetes Self-Management Education  Visit Type: First/Initial  Appt. Start Time: 1500 Appt. End Time: 1600  05/24/2019  [name], identified by name and date of birth, is a 73 y.o. male with a diagnosis of Diabetes: Type 2. Patient states history of diabetes for 5-10 years. He lives alone, has food stamps to help pay for groceries and family members take him grocery shopping. He was not able to complete pre-visit paperwork, and although he states completing the 12th grade, his literacy is low. I used verbal instruction today to help explain factors that affect his BG including food and sickness raising it and activity and insulin lowering it. He walks for about an hour several days a week after his dinner meal. He tests BG twice daily.   ASSESSMENT  There were no vitals taken for this visit. There is no height or weight on file to calculate BMI.  Diabetes Self-Management Education - 05/24/19 1514      Visit Information   Visit Type  First/Initial      Initial Visit   Diabetes Type  Type 2    Are you currently following a meal plan?  Yes    What type of meal plan do you follow?  healthy foods like vegetables and fish    Are you taking your medications as prescribed?  Yes    Date Diagnosed  5-10 years ago      Health Coping   How would you rate your overall health?  Good      Psychosocial Assessment   Patient Belief/Attitude about Diabetes  Motivated to manage diabetes    Self-care barriers  Low literacy;Lack of material resources    Other persons present  Patient    Patient Concerns  Nutrition/Meal planning;Glycemic Control    Special Needs  Simplified materials;Verbal instruction    Preferred Learning Style  Auditory;Visual    Learning Readiness  Ready    How often do you need to have someone help you when you read instructions, pamphlets, or other written materials from your doctor or pharmacy?  3 - Sometimes    What is the last grade level you completed in school?   12      Pre-Education Assessment   Patient understands the diabetes disease and treatment process.  Needs Instruction    Patient understands incorporating nutritional management into lifestyle.  Needs Review    Patient undertands incorporating physical activity into lifestyle.  Needs Review    Patient understands using medications safely.  Needs Review    Patient understands monitoring blood glucose, interpreting and using results  Needs Review    Patient understands prevention, detection, and treatment of acute complications.  Needs Instruction    Patient understands prevention, detection, and treatment of chronic complications.  Needs Review    Patient understands how to develop strategies to address psychosocial issues.  Needs Review    Patient understands how to develop strategies to promote health/change behavior.  Needs Review      Complications   Last HgB A1C per patient/outside source  9.2 %    How often do you check your blood sugar?  1-2 times/day    Fasting Blood glucose range (mg/dL)  130-179    Postprandial Blood glucose range (mg/dL)  130-179    Number of hypoglycemic episodes per month  1    Have you had a dilated eye exam in the past 12 months?  Yes    Have you had a dental exam in the past 12  months?  No   has dentures   Are you checking your feet?  Yes    How many days per week are you checking your feet?  3      Dietary Intake   Breakfast  [employer] or [employer]: sausage and egg, hash browns when he is out of milk at home OR Cheerios or Corn Flakes cereal with 2% milk    Snack (morning)  PNB crackers when has them at home    Lunch  sandwich occasionally with chips    Snack (afternoon)  fresh fruit occasionally    Dinner  [employer] with potato salad or collard greens    Snack (evening)  cereal while watching tv    Beverage(s)  water, lemonade or regular soda      Exercise   Exercise Type  Light (walking / raking leaves)    How many days per week to you  exercise?  4    How many minutes per day do you exercise?  45    Total minutes per week of exercise  180      Patient Education   Nutrition management   Meal options for control of blood glucose level and chronic complications.;Other (comment)   used plate method instead of carb counting   Physical activity and exercise   Helped patient identify appropriate exercises in relation to his/her diabetes, diabetes complications and other health issue.    Medications  Reviewed patients medication for diabetes, action, purpose, timing of dose and side effects.    Monitoring  Identified appropriate SMBG and/or A1C goals.    Acute complications  Taught treatment of hypoglycemia - the 15 rule.      Individualized Goals (developed by patient)   Nutrition  General guidelines for healthy choices and portions discussed    Physical Activity  Exercise 3-5 times per week    Medications  take my medication as prescribed    Monitoring   test blood glucose pre and post meals as discussed      Post-Education Assessment   Patient understands incorporating nutritional management into lifestyle.  Demonstrates understanding / competency   understands to limit starch servings to a couple per meal   Patient undertands incorporating physical activity into lifestyle.  Demonstrates understanding / competency    Patient understands using medications safely.  Demonstrates understanding / competency    Patient understands monitoring blood glucose, interpreting and using results  Demonstrates understanding / competency    Patient understands prevention, detection, and treatment of acute complications.  Demonstrates understanding / competency      Outcomes   Expected Outcomes  Demonstrated interest in learning. Expect positive outcomes    Future DMSE  PRN    Program Status  Not Completed       Individualized Plan for Diabetes Self-Management Training:   Learning Objective:  Patient will have a greater understanding  of diabetes self-management. Patient education plan is to attend individual and/or group sessions per assessed needs and concerns.   Plan:   Patient Instructions  Plan:   Aim for 2-3 Carb Choices per meal (30-45 grams)    Aim for 0-2 Carbs per snack if hungry   Include protein in moderation with your meals and snacks  Continue with your activity level by walking for 30-60 minutes daily as tolerated  Continue checking BG at alternate times per day   Continue taking medication as directed by MD.   Consider letting your MD know how much the [place] makes  you urinate, sometimes causing accidents when you are away from home.   Expected Outcomes:  Demonstrated interest in learning. Expect positive outcomes  Education material provided: A1C conversion sheet  If problems or questions, patient to contact team via:  Phone  Future DSME appointment: PRN

## 2019-05-28 ENCOUNTER — Telehealth: Payer: Self-pay | Admitting: Endocrinology

## 2019-05-28 ENCOUNTER — Other Ambulatory Visit: Payer: Self-pay | Admitting: Endocrinology

## 2019-05-28 ENCOUNTER — Other Ambulatory Visit: Payer: Self-pay | Admitting: Internal Medicine

## 2019-05-28 ENCOUNTER — Other Ambulatory Visit: Payer: Self-pay

## 2019-05-28 DIAGNOSIS — E1140 Type 2 diabetes mellitus with diabetic neuropathy, unspecified: Secondary | ICD-10-CM

## 2019-05-28 DIAGNOSIS — E118 Type 2 diabetes mellitus with unspecified complications: Secondary | ICD-10-CM

## 2019-05-28 DIAGNOSIS — R69 Illness, unspecified: Secondary | ICD-10-CM | POA: Diagnosis not present

## 2019-05-28 DIAGNOSIS — Z794 Long term (current) use of insulin: Secondary | ICD-10-CM

## 2019-05-28 MED ORDER — GLUCOSE BLOOD VI STRP: ORAL_STRIP | 2 refills | Status: DC

## 2019-05-28 MED ORDER — GABAPENTIN 300 MG PO CAPS: ORAL_CAPSULE | ORAL | 1 refills | Status: DC

## 2019-05-28 MED ORDER — ONETOUCH DELICA LANCETS 30G MISC: 1.0000 | Freq: Three times a day (TID) | 2 refills | Status: DC

## 2019-05-28 MED ORDER — ONETOUCH VERIO FLEX SYSTEM W/DEVICE KIT: 1.0000 | PACK | Freq: Three times a day (TID) | 0 refills | Status: DC

## 2019-05-28 NOTE — Telephone Encounter (Signed)
Pharmacy called stating that patients insurance will no longer cover Accu-Check but will cover One Touch. They will need RX's for test strips and lancets.  Please Advise, Thanks

## 2019-05-28 NOTE — Telephone Encounter (Signed)
Rx sent 

## 2019-05-30 DIAGNOSIS — R69 Illness, unspecified: Secondary | ICD-10-CM | POA: Diagnosis not present

## 2019-06-19 ENCOUNTER — Other Ambulatory Visit: Payer: Self-pay | Admitting: *Deleted

## 2019-06-19 DIAGNOSIS — R69 Illness, unspecified: Secondary | ICD-10-CM | POA: Diagnosis not present

## 2019-06-20 NOTE — Patient Outreach (Signed)
[facility]) Care Management  06/20/2019   Charles Parker May 12, 1946 [id number]   RN Health Coach telephone call to patient.  Hipaa compliance verified. Per patient his FBS is 150. RN discussed with patient about the range of FBS to help make A1C ,7. Patient stated that he is trying to eat healthy and that he was told to eat lean cuisines. Patient has exercise routine of walking 2- 3 miles. Patient is monitoring his blood sugars as per ordered. Patient is taking medications as per ordered. Patient has agreed to follow up outreach .   Current Medications:  Current Outpatient Medications  Medication Sig Dispense Refill  . Alcohol Swabs PADS Use to clean area before insulin injections. DX E11.8 200 each 3  . Avanafil (STENDRA) 200 MG TABS Take 1 tablet by mouth every 3 (three) days. 10 tablet 5  . Azilsartan-Chlorthalidone (EDARBYCLOR) 40-12.5 MG TABS Take 1 tablet by mouth daily. 21 tablet 0  . Blood Glucose Monitoring Suppl (ONETOUCH VERIO FLEX SYSTEM) w/Device KIT 1 each by Does not apply route 3 (three) times daily. Use Onetouch Verio flex to check blood sugar three times daily. 1 kit 0  . Cholecalciferol 2000 units TABS Take 1 tablet (2,000 Units total) by mouth daily. 90 tablet 1  . dapagliflozin propanediol (FARXIGA) 10 MG TABS tablet Take 10 mg by mouth daily. 30 tablet 0  . ferrous sulfate 325 (65 FE) MG tablet Take 1 tablet (325 mg total) by mouth 2 (two) times daily with a meal. 180 tablet 1  . fluconazole (DIFLUCAN) 150 MG tablet Take 1 tablet (150 mg total) by mouth once a week. 4 tablet 1  . gabapentin (NEURONTIN) 300 MG capsule TAKE 1 CAPSULE(300 MG) BY MOUTH TWICE DAILY 180 capsule 1  . glucose blood test strip Use Onetouch verio test strips as instructed to check blood sugar three times daily. 100 each 2  . insulin aspart protamine- aspart (NOVOLOG MIX 70/30) (70-30) 100 UNIT/ML injection 30 units before breakfast and 20 units before supper 1020 mL 3  .  Insulin Pen Needle (EASY TOUCH PEN NEEDLES) 31G X 5 MM MISC ADMINISTER LANTUS ONCE EVERY EVENING 100 each 3  . Multiple Vitamin (MULTIVITAMIN WITH MINERALS) TABS tablet Take 1 tablet by mouth daily.    Charles Parker Lancets 40C MISC 1 each by Does not apply route 3 (three) times daily. Use Onetouch Parker lancets to check blood sugar three times daily. 100 each 2  . oxybutynin (DITROPAN) 5 MG tablet TAKE 1 TABLET BY MOUTH EVERY DAY 30 tablet 0  . rivaroxaban (XARELTO) 20 MG TABS tablet TAKE 1 TABLET(20 MG) BY MOUTH DAILY WITH SUPPER 30 tablet 0  . simvastatin (ZOCOR) 40 MG tablet TAKE 1 TABLET(40 MG) BY MOUTH AT BEDTIME 90 tablet 1  . tamsulosin (FLOMAX) 0.4 MG CAPS capsule TAKE 1 CAPSULE(0.4 MG) BY MOUTH DAILY AFTER BREAKFAST 30 capsule 0  . TRUEPLUS LANCETS 33G MISC USE TO CHECK BLOOD SUGARS THREE TIMES DAY 300 each 1   No current facility-administered medications for this visit.     Functional Status:  In your present state of health, do you have any difficulty performing the following activities: 06/19/2019 04/18/2019  Hearing? N N  Vision? N N  Difficulty concentrating or making decisions? N N  Walking or climbing stairs? N N  Dressing or bathing? N N  Doing errands, shopping? N N  Preparing Food and eating ? N N  Using the Toilet? N N  In the  past six months, have you accidently leaked urine? N Y  Do you have problems with loss of bowel control? N N  Managing your Medications? N N  Managing your Finances? - N  Housekeeping or managing your Housekeeping? - N  Some recent data might be hidden    Fall/Depression Screening: Fall Risk  06/19/2019 04/18/2019 03/29/2019  Falls in the past year? 0 0 0  Number falls in past yr: 0 (No Data) 0  Comment - none -  Injury with Fall? 0 - 0  Risk Factor Category  - - -  Risk for fall due to : - - -  Follow up Falls evaluation completed - -   PHQ 2/9 Scores 06/19/2019 05/24/2019 04/18/2019 03/30/2019 03/29/2019 01/04/2018 12/19/2017  PHQ - 2  Score 0 0 0 0 0 1 0  PHQ- 9 Score - - - - - 3 -   THN CM Care Plan Problem One     Most Recent Value  Care Plan Problem One  Knowledge Deficit in Self Management of Diabetes  Role Documenting the Problem One  [place] for Problem One  Active  THN Long Term Goal   Patient will see a decrease in A1C from 9.5 within the next 90 days  Interventions for Problem One Long Term Goal  RN reiterated what the FBS is and the range we would like to see it. RN discussed the A1C and next blood draw is in October. RN will follow up for further discussion  THN CM Short Term Goal #1   Patient will verbalize have a better understanding of hyper and hypoglycemia within the next  30days  THN CM Short Term Goal #2   Patient will verbalize understanding healthy snacks within the next 30 days  Interventions for Short Term Goal #2  RN reiterated healthy snacks. Patient has also been seeing a nutritionist. RN discussed with patient about the special food assistance from [employer] to help get extra healthy vegetable and fruits. RN will follow up with further discussion  THN CM Short Term Goal #3  Patient will follow up with Health Maintenance within the next 30days  [facility] CM Short Term Goal #3 Start Date  06/19/19  Interventions for Short Tern Goal #3  RN Health Coach discussed Health Maintenance. RN sent educational material on Health Maintenance day to day and throughout the year. RN will follow up with further discussion      Assessment:  A1C 9.5 FBS 15 Patient is practicing pandemic safety precautions Patient is monitoring BS as per order Patient had eye exam on 03/16/2019 Plan:  RN discussed Health Maintenance RN sent EMMI education on Health Maintenance day to day RN sent  EMMI education on Health Maintenance throughout the year Patient will follow up on Flu shot Patient will Continue trying to eat healthy RN made patient aware of the [place] eating program Patient will continue  trying to exercise daily RN will follow up with outreach call within the month of December  Charles Parker [place] [facility] [phone]

## 2019-07-02 ENCOUNTER — Other Ambulatory Visit (INDEPENDENT_AMBULATORY_CARE_PROVIDER_SITE_OTHER): Payer: Medicare HMO

## 2019-07-02 ENCOUNTER — Other Ambulatory Visit: Payer: Self-pay

## 2019-07-02 DIAGNOSIS — E7800 Pure hypercholesterolemia, unspecified: Secondary | ICD-10-CM

## 2019-07-02 DIAGNOSIS — Z794 Long term (current) use of insulin: Secondary | ICD-10-CM

## 2019-07-02 DIAGNOSIS — E1165 Type 2 diabetes mellitus with hyperglycemia: Secondary | ICD-10-CM | POA: Diagnosis not present

## 2019-07-02 LAB — COMPREHENSIVE METABOLIC PANEL
ALT: 25 U/L (ref 0–53)
AST: 16 U/L (ref 0–37)
Albumin: 4.2 g/dL (ref 3.5–5.2)
Alkaline Phosphatase: 57 U/L (ref 39–117)
BUN: 21 mg/dL (ref 6–23)
CO2: 26 mEq/L (ref 19–32)
Calcium: 9.7 mg/dL (ref 8.4–10.5)
Chloride: 101 mEq/L (ref 96–112)
Creatinine, Ser: 1.15 mg/dL (ref 0.40–1.50)
GFR: 75.36 mL/min (ref >60.00)
Glucose, Bld: 325 mg/dL — ABNORMAL HIGH (ref 70–99)
Potassium: 3.6 mEq/L (ref 3.5–5.1)
Sodium: 139 mEq/L (ref 135–145)
Total Bilirubin: 0.6 mg/dL (ref 0.2–1.2)
Total Protein: 7.2 g/dL (ref 6.0–8.3)

## 2019-07-02 LAB — HEMOGLOBIN A1C: Hgb A1c MFr Bld: 8.9 % — ABNORMAL HIGH (ref 4.6–6.5)

## 2019-07-02 LAB — LDL CHOLESTEROL, DIRECT: Direct LDL: 115 mg/dL

## 2019-07-05 ENCOUNTER — Ambulatory Visit: Payer: Medicare HMO | Admitting: Endocrinology

## 2019-07-16 ENCOUNTER — Other Ambulatory Visit: Payer: Self-pay | Admitting: Internal Medicine

## 2019-07-16 DIAGNOSIS — Z794 Long term (current) use of insulin: Secondary | ICD-10-CM

## 2019-07-16 DIAGNOSIS — E1165 Type 2 diabetes mellitus with hyperglycemia: Secondary | ICD-10-CM

## 2019-07-16 MED ORDER — FARXIGA 10 MG PO TABS: 10.0000 mg | ORAL_TABLET | Freq: Every day | ORAL | 0 refills | Status: DC

## 2019-07-17 DIAGNOSIS — R69 Illness, unspecified: Secondary | ICD-10-CM | POA: Diagnosis not present

## 2019-07-23 ENCOUNTER — Telehealth: Payer: Self-pay | Admitting: *Deleted

## 2019-07-23 DIAGNOSIS — I824Y1 Acute embolism and thrombosis of unspecified deep veins of right proximal lower extremity: Secondary | ICD-10-CM

## 2019-07-23 MED ORDER — RIVAROXABAN 20 MG PO TABS: ORAL_TABLET | ORAL | 0 refills | Status: DC

## 2019-07-23 NOTE — Telephone Encounter (Signed)
I called pt- Metformin was d/c'd due to side effects. See allergies. He states he needs refill on Xarelto and Farxiga. Xarelto refill sent. See meds. Wilder Glade was sent on 07/16/19.

## 2019-07-23 NOTE — Telephone Encounter (Signed)
Copied from [place] [id number]. Topic: General - Other >> Jul 20, 2019 11:37 AM Rainey Pines A wrote: Patient would like a callback from nurse in regards to metformin use

## 2019-09-05 ENCOUNTER — Other Ambulatory Visit: Payer: Self-pay | Admitting: Internal Medicine

## 2019-09-05 ENCOUNTER — Telehealth: Payer: Self-pay | Admitting: Internal Medicine

## 2019-09-05 DIAGNOSIS — E785 Hyperlipidemia, unspecified: Secondary | ICD-10-CM

## 2019-09-05 DIAGNOSIS — I824Y1 Acute embolism and thrombosis of unspecified deep veins of right proximal lower extremity: Secondary | ICD-10-CM

## 2019-09-05 DIAGNOSIS — E1140 Type 2 diabetes mellitus with diabetic neuropathy, unspecified: Secondary | ICD-10-CM

## 2019-09-05 MED ORDER — SIMVASTATIN 40 MG PO TABS: ORAL_TABLET | ORAL | 1 refills | Status: DC

## 2019-09-05 MED ORDER — RIVAROXABAN 20 MG PO TABS: ORAL_TABLET | ORAL | 0 refills | Status: DC

## 2019-09-05 MED ORDER — GABAPENTIN 300 MG PO CAPS: ORAL_CAPSULE | ORAL | 1 refills | Status: DC

## 2019-09-05 NOTE — Telephone Encounter (Signed)
Pt called in to request a refill for 3 medications  rivaroxaban (XARELTO) 20 MG TABS tablet  simvastatin (ZOCOR) 40 MG tablet gabapentin (NEURONTIN) 300 MG capsule    Pharmacy: [place], [address] AT [address] [place]

## 2019-09-06 ENCOUNTER — Other Ambulatory Visit: Payer: Self-pay | Admitting: Internal Medicine

## 2019-09-06 DIAGNOSIS — Z794 Long term (current) use of insulin: Secondary | ICD-10-CM | POA: Diagnosis not present

## 2019-09-06 DIAGNOSIS — R030 Elevated blood-pressure reading, without diagnosis of hypertension: Secondary | ICD-10-CM | POA: Diagnosis not present

## 2019-09-06 DIAGNOSIS — E785 Hyperlipidemia, unspecified: Secondary | ICD-10-CM | POA: Diagnosis not present

## 2019-09-06 DIAGNOSIS — Z7901 Long term (current) use of anticoagulants: Secondary | ICD-10-CM | POA: Diagnosis not present

## 2019-09-06 DIAGNOSIS — Z8249 Family history of ischemic heart disease and other diseases of the circulatory system: Secondary | ICD-10-CM | POA: Diagnosis not present

## 2019-09-06 DIAGNOSIS — E118 Type 2 diabetes mellitus with unspecified complications: Secondary | ICD-10-CM

## 2019-09-06 DIAGNOSIS — D6869 Other thrombophilia: Secondary | ICD-10-CM | POA: Diagnosis not present

## 2019-09-06 DIAGNOSIS — E1165 Type 2 diabetes mellitus with hyperglycemia: Secondary | ICD-10-CM

## 2019-09-06 DIAGNOSIS — E1142 Type 2 diabetes mellitus with diabetic polyneuropathy: Secondary | ICD-10-CM | POA: Diagnosis not present

## 2019-09-06 DIAGNOSIS — I4891 Unspecified atrial fibrillation: Secondary | ICD-10-CM | POA: Diagnosis not present

## 2019-09-06 DIAGNOSIS — Z809 Family history of malignant neoplasm, unspecified: Secondary | ICD-10-CM | POA: Diagnosis not present

## 2019-09-06 MED ORDER — FARXIGA 10 MG PO TABS: 10.0000 mg | ORAL_TABLET | Freq: Every day | ORAL | 0 refills | Status: DC

## 2019-09-13 ENCOUNTER — Other Ambulatory Visit: Payer: Self-pay | Admitting: *Deleted

## 2019-09-13 NOTE — Patient Outreach (Signed)
[facility]) Care Management  [place]  09/13/2019   Charles Parker 01/28/1946 [id number]  [place] telephone call to patient.  Hipaa compliance verified. Per patient his A1C is 8.9 decreased from 9.5. Fasting blood sugar is 135  Patient is trying to eat better and take medication as prescribed. Patient missed appointment with Endocrinologist. Per patient he forgot. RN discussed importance of keeping appointments. RN will follow up with further discussion  Encounter Medications:  Outpatient Encounter Medications as of 09/13/2019  Medication Sig  . Alcohol Swabs PADS Use to clean area before insulin injections. DX E11.8  . Avanafil (STENDRA) 200 MG TABS Take 1 tablet by mouth every 3 (three) days.  . Blood Glucose Monitoring Suppl (ONETOUCH VERIO FLEX SYSTEM) w/Device KIT 1 each by Does not apply route 3 (three) times daily. Use Onetouch Verio flex to check blood sugar three times daily.  . Cholecalciferol 2000 units TABS Take 1 tablet (2,000 Units total) by mouth daily.  . dapagliflozin propanediol (FARXIGA) 10 MG TABS tablet Take 10 mg by mouth daily.  . ferrous sulfate 325 (65 FE) MG tablet Take 1 tablet (325 mg total) by mouth 2 (two) times daily with a meal.  . gabapentin (NEURONTIN) 300 MG capsule TAKE 1 CAPSULE(300 MG) BY MOUTH TWICE DAILY  . glucose blood test strip Use Onetouch verio test strips as instructed to check blood sugar three times daily.  . insulin aspart protamine- aspart (NOVOLOG MIX 70/30) (70-30) 100 UNIT/ML injection 30 units before breakfast and 20 units before supper  . Insulin Pen Needle (EASY TOUCH PEN NEEDLES) 31G X 5 MM MISC ADMINISTER LANTUS ONCE EVERY EVENING  . Multiple Vitamin (MULTIVITAMIN WITH MINERALS) TABS tablet Take 1 tablet by mouth daily.  [name] Lancets 40J MISC 1 each by Does not apply route 3 (three) times daily. Use Onetouch delica lancets to check blood sugar three times daily.  Marland Kitchen oxybutynin  (DITROPAN) 5 MG tablet TAKE 1 TABLET BY MOUTH EVERY DAY  . rivaroxaban (XARELTO) 20 MG TABS tablet TAKE 1 TABLET(20 MG) BY MOUTH DAILY WITH SUPPER  . simvastatin (ZOCOR) 40 MG tablet TAKE 1 TABLET(40 MG) BY MOUTH AT BEDTIME  . tamsulosin (FLOMAX) 0.4 MG CAPS capsule TAKE 1 CAPSULE(0.4 MG) BY MOUTH DAILY AFTER BREAKFAST  . TRUEPLUS LANCETS 33G MISC USE TO CHECK BLOOD SUGARS THREE TIMES DAY   No facility-administered encounter medications on file as of 09/13/2019.    Functional Status:  In your present state of health, do you have any difficulty performing the following activities: 09/13/2019 06/19/2019  Hearing? N N  Vision? N N  Difficulty concentrating or making decisions? N N  Walking or climbing stairs? N N  Dressing or bathing? N N  Doing errands, shopping? N N  Preparing Food and eating ? N N  Using the Toilet? N N  In the past six months, have you accidently leaked urine? N N  Do you have problems with loss of bowel control? N N  Managing your Medications? N N  Managing your Finances? N -  Housekeeping or managing your Housekeeping? N -  Some recent data might be hidden    Fall/Depression Screening: Fall Risk  09/13/2019 06/19/2019 04/18/2019  Falls in the past year? 0 0 0  Number falls in past yr: 0 0 (No Data)  Comment - - none  Injury with Fall? 0 0 -  Risk Factor Category  - - -  Risk for fall due to : - - -  Follow up Falls evaluation completed Falls evaluation completed -   PHQ 2/9 Scores 06/19/2019 05/24/2019 04/18/2019 03/30/2019 03/29/2019 01/04/2018 12/19/2017  PHQ - 2 Score 0 0 0 0 0 1 0  PHQ- 9 Score - - - - - 3 -   THN CM Care Plan Problem One     Most Recent Value  Care Plan Problem One  Knowledge Deficit in Self Management of Diabetes  Role Documenting the Problem One  [place] for Problem One  Active  THN Long Term Goal   Patient will see a decrease in A1C from 8.9 within the next 90 days  THN Long Term Goal Start Date  09/13/19  Interventions  for Problem One Long Term Goal  RN encouraged patient with progress of A1C decrease to 8.9 from 9.5 to head for his goal of 7.0. RN reiterated eating healthy, exercising and medication adherence. RN will follow up with further duiscussion and support  THN CM Short Term Goal #1 Met Date  09/13/19  [facility] CM Short Term Goal #2   Patient will verbalize understanding healthy snacks within the next 30 days  THN CM Short Term Goal #2 Met Date  09/13/19  [facility] CM Short Term Goal #3  Patient will follow up with Health Maintenance within the next 30days  [facility] CM Short Term Goal #3 Start Date  09/13/19  Interventions for Short Tern Goal #3  RN reiterated Health Maintenance with patient. RN scheduled follow up with Podiatrist and Endocrinologist. RN called patient nad notified him of dates and the importance of keeping appointments. RN will follow up for compliance and further discussion      Assessment:  A1C decreased from 9.5 to 8.9 Fasting blood sugar is 135 Patient is exercising 5 out of 7 days Patient missed endocrinology appointment Patient has not seen podiatrist since last year  Plan:  RN called endocrinologist and rescheduled appointment RN called podiatrist and scheduled appointment RN discussed A1C and decrease from 9.5 to 8.9 Patient will be working on A1C goal of 7.0 RN sent 2021 Calendar book RN discussed [place] eating and maintaining diet RN will follow up within the month of March  Panda Crossin [place] [facility] [phone]

## 2019-09-24 ENCOUNTER — Other Ambulatory Visit: Payer: Self-pay

## 2019-09-24 ENCOUNTER — Encounter: Payer: Self-pay | Admitting: Internal Medicine

## 2019-09-24 ENCOUNTER — Ambulatory Visit (INDEPENDENT_AMBULATORY_CARE_PROVIDER_SITE_OTHER): Payer: Medicare HMO | Admitting: Internal Medicine

## 2019-09-24 VITALS — BP 134/84 | HR 85 | Temp 98.3°F | Resp 16 | Ht 68.5 in | Wt 193.5 lb

## 2019-09-24 DIAGNOSIS — I824Y1 Acute embolism and thrombosis of unspecified deep veins of right proximal lower extremity: Secondary | ICD-10-CM | POA: Diagnosis not present

## 2019-09-24 DIAGNOSIS — E118 Type 2 diabetes mellitus with unspecified complications: Secondary | ICD-10-CM

## 2019-09-24 DIAGNOSIS — D6859 Other primary thrombophilia: Secondary | ICD-10-CM

## 2019-09-24 DIAGNOSIS — I10 Essential (primary) hypertension: Secondary | ICD-10-CM

## 2019-09-24 DIAGNOSIS — R791 Abnormal coagulation profile: Secondary | ICD-10-CM | POA: Diagnosis not present

## 2019-09-24 DIAGNOSIS — N3281 Overactive bladder: Secondary | ICD-10-CM | POA: Diagnosis not present

## 2019-09-24 DIAGNOSIS — E611 Iron deficiency: Secondary | ICD-10-CM

## 2019-09-24 DIAGNOSIS — B3742 Candidal balanitis: Secondary | ICD-10-CM

## 2019-09-24 DIAGNOSIS — R3916 Straining to void: Secondary | ICD-10-CM | POA: Diagnosis not present

## 2019-09-24 DIAGNOSIS — Z794 Long term (current) use of insulin: Secondary | ICD-10-CM

## 2019-09-24 DIAGNOSIS — N401 Enlarged prostate with lower urinary tract symptoms: Secondary | ICD-10-CM | POA: Diagnosis not present

## 2019-09-24 DIAGNOSIS — E559 Vitamin D deficiency, unspecified: Secondary | ICD-10-CM

## 2019-09-24 LAB — CBC WITH DIFFERENTIAL/PLATELET
Basophils Absolute: 0 10*3/uL (ref 0.0–0.1)
Basophils Relative: 0.4 % (ref 0.0–3.0)
Eosinophils Absolute: 0 10*3/uL (ref 0.0–0.7)
Eosinophils Relative: 0.5 % (ref 0.0–5.0)
HCT: 49.3 % (ref 39.0–52.0)
Hemoglobin: 16.2 g/dL (ref 13.0–17.0)
Lymphocytes Relative: 32 % (ref 12.0–46.0)
Lymphs Abs: 1.4 10*3/uL (ref 0.7–4.0)
MCHC: 32.8 g/dL (ref 30.0–36.0)
MCV: 94.5 fl (ref 78.0–100.0)
Monocytes Absolute: 0.4 10*3/uL (ref 0.1–1.0)
Monocytes Relative: 8.8 % (ref 3.0–12.0)
Neutro Abs: 2.5 10*3/uL (ref 1.4–7.7)
Neutrophils Relative %: 58.3 % (ref 43.0–77.0)
Platelets: 176 10*3/uL (ref 150.0–400.0)
RBC: 5.22 Mil/uL (ref 4.22–5.81)
RDW: 13.3 % (ref 11.5–15.5)
WBC: 4.3 10*3/uL (ref 4.0–10.5)

## 2019-09-24 LAB — BASIC METABOLIC PANEL
BUN: 16 mg/dL (ref 6–23)
CO2: 26 mEq/L (ref 19–32)
Calcium: 9.8 mg/dL (ref 8.4–10.5)
Chloride: 100 mEq/L (ref 96–112)
Creatinine, Ser: 1.21 mg/dL (ref 0.40–1.50)
GFR: 71.01 mL/min (ref >60.00)
Glucose, Bld: 342 mg/dL — ABNORMAL HIGH (ref 70–99)
Potassium: 3.9 mEq/L (ref 3.5–5.1)
Sodium: 137 mEq/L (ref 135–145)

## 2019-09-24 LAB — URINALYSIS, ROUTINE W REFLEX MICROSCOPIC
Bilirubin Urine: NEGATIVE
Hgb urine dipstick: NEGATIVE
Ketones, ur: NEGATIVE
Leukocytes,Ua: NEGATIVE
Nitrite: NEGATIVE
RBC / HPF: NONE SEEN (ref 0–?)
Specific Gravity, Urine: 1.01 (ref 1.000–1.030)
Total Protein, Urine: NEGATIVE
Urine Glucose: >=1000 — AB
Urobilinogen, UA: 0.2 (ref 0.0–1.0)
pH: 5 (ref 5.0–8.0)

## 2019-09-24 LAB — PSA: PSA: 2.7 ng/mL (ref 0.10–4.00)

## 2019-09-24 LAB — HEMOGLOBIN A1C: Hgb A1c MFr Bld: 11.5 % — ABNORMAL HIGH (ref 4.6–6.5)

## 2019-09-24 MED ORDER — OXYBUTYNIN CHLORIDE ER 15 MG PO TB24: 15.0000 mg | ORAL_TABLET | Freq: Every day | ORAL | 1 refills | Status: DC

## 2019-09-24 MED ORDER — FLUCONAZOLE 100 MG PO TABS: 100.0000 mg | ORAL_TABLET | Freq: Every day | ORAL | 1 refills | Status: AC

## 2019-09-24 MED ORDER — CHOLECALCIFEROL 50 MCG (2000 UT) PO TABS: 1.0000 | ORAL_TABLET | Freq: Every day | ORAL | 1 refills | Status: DC

## 2019-09-24 MED ORDER — RIVAROXABAN 10 MG PO TABS: 10.0000 mg | ORAL_TABLET | Freq: Every day | ORAL | 1 refills | Status: DC

## 2019-09-24 NOTE — Patient Instructions (Signed)

## 2019-09-24 NOTE — Progress Notes (Signed)
Subjective:  Patient ID: Charles Parker, male    DOB: Feb 07, 1946  Age: 73 y.o. MRN: [id number]  CC: Hypertension, Anemia, and Diabetes   This visit occurred during the SARS-CoV-2 public health emergency.  Safety protocols were in place, including screening questions prior to the visit, additional usage of staff PPE, and extensive cleaning of exam room while observing appropriate contact time as indicated for disinfecting solutions.   HPI [name] presents for f/up - He complains of frequent urination.  He does not monitor his blood sugar.  He thinks he is compliant with all of his diabetic medications.  He denies weight loss, polydipsia, or polyphagia.  He thinks he has had 2 or 3 spontaneous blood clots in his legs over the course of his life.  He has been compliant with Xarelto in the last few years and has had no recurrences.  He wants to know if he needs to continue taking Xarelto.  Outpatient Medications Prior to Visit  Medication Sig Dispense Refill  . Alcohol Swabs PADS Use to clean area before insulin injections. DX E11.8 200 each 3  . Avanafil (STENDRA) 200 MG TABS Take 1 tablet by mouth every 3 (three) days. 10 tablet 5  . Blood Glucose Monitoring Suppl (ONETOUCH VERIO FLEX SYSTEM) w/Device KIT 1 each by Does not apply route 3 (three) times daily. Use Onetouch Verio flex to check blood sugar three times daily. 1 kit 0  . dapagliflozin propanediol (FARXIGA) 10 MG TABS tablet Take 10 mg by mouth daily. 90 tablet 0  . gabapentin (NEURONTIN) 300 MG capsule TAKE 1 CAPSULE(300 MG) BY MOUTH TWICE DAILY 180 capsule 1  . glucose blood test strip Use Onetouch verio test strips as instructed to check blood sugar three times daily. 100 each 2  . Insulin Pen Needle (EASY TOUCH PEN NEEDLES) 31G X 5 MM MISC ADMINISTER LANTUS ONCE EVERY EVENING 100 each 3  . Multiple Vitamin (MULTIVITAMIN WITH MINERALS) TABS tablet Take 1 tablet by mouth daily.    [name] Lancets 00F MISC 1  each by Does not apply route 3 (three) times daily. Use Onetouch delica lancets to check blood sugar three times daily. 100 each 2  . simvastatin (ZOCOR) 40 MG tablet TAKE 1 TABLET(40 MG) BY MOUTH AT BEDTIME 90 tablet 1  . tamsulosin (FLOMAX) 0.4 MG CAPS capsule TAKE 1 CAPSULE(0.4 MG) BY MOUTH DAILY AFTER BREAKFAST 30 capsule 0  . TRUEPLUS LANCETS 33G MISC USE TO CHECK BLOOD SUGARS THREE TIMES DAY 300 each 1  . Cholecalciferol 2000 units TABS Take 1 tablet (2,000 Units total) by mouth daily. 90 tablet 1  . insulin aspart protamine- aspart (NOVOLOG MIX 70/30) (70-30) 100 UNIT/ML injection 30 units before breakfast and 20 units before supper 1020 mL 3  . oxybutynin (DITROPAN) 5 MG tablet TAKE 1 TABLET BY MOUTH EVERY DAY 30 tablet 0  . rivaroxaban (XARELTO) 20 MG TABS tablet TAKE 1 TABLET(20 MG) BY MOUTH DAILY WITH SUPPER 90 tablet 0  . ferrous sulfate 325 (65 FE) MG tablet Take 1 tablet (325 mg total) by mouth 2 (two) times daily with a meal. 180 tablet 1   No facility-administered medications prior to visit.    ROS Review of Systems  Constitutional: Negative.  Negative for appetite change, diaphoresis, fatigue and unexpected weight change.  HENT: Negative.   Eyes: Negative for visual disturbance.  Respiratory: Negative for cough, chest tightness, shortness of breath and wheezing.   Cardiovascular: Negative for chest pain, palpitations  and leg swelling.  Gastrointestinal: Negative for abdominal pain, constipation, diarrhea, nausea and vomiting.  Endocrine: Positive for polyuria. Negative for polydipsia and polyphagia.  Genitourinary: Positive for frequency and hematuria. Negative for difficulty urinating, dysuria, flank pain, scrotal swelling and testicular pain.  Musculoskeletal: Negative.  Negative for arthralgias and myalgias.  Skin: Negative.  Negative for color change.  Neurological: Negative.  Negative for dizziness, weakness and light-headedness.  Hematological: Negative for  adenopathy. Does not bruise/bleed easily.  Psychiatric/Behavioral: Negative.     Objective:  BP 134/84 (BP Location: Left Arm, Patient Position: Sitting, Cuff Size: Normal)   Pulse 85   Temp 98.3 F (36.8 C) (Oral)   Resp 16   Ht 5' 8.5" (1.74 m)   Wt 193 lb 8 oz (87.8 kg)   SpO2 95%   BMI 28.99 kg/m   BP Readings from Last 3 Encounters:  09/24/19 134/84  05/02/19 128/80  03/29/19 130/82    Wt Readings from Last 3 Encounters:  09/24/19 193 lb 8 oz (87.8 kg)  05/02/19 192 lb (87.1 kg)  03/29/19 192 lb (87.1 kg)    Physical Exam Vitals reviewed.  Constitutional:      Appearance: Normal appearance.  HENT:     Nose: Nose normal.     Mouth/Throat:     Mouth: Mucous membranes are moist.  Eyes:     General: No scleral icterus.    Conjunctiva/sclera: Conjunctivae normal.  Cardiovascular:     Rate and Rhythm: Normal rate and regular rhythm.     Heart sounds: No murmur.  Pulmonary:     Effort: Pulmonary effort is normal.     Breath sounds: No stridor. No wheezing, rhonchi or rales.  Abdominal:     General: Abdomen is flat. Bowel sounds are normal. There is no distension.     Palpations: Abdomen is soft. There is no hepatomegaly or splenomegaly.     Tenderness: There is no abdominal tenderness.  Genitourinary:    Pubic Area: Rash present.     Penis: Circumcised. Erythema and discharge present. No tenderness, swelling or lesions.      Testes: Normal.        Right: Mass, tenderness or swelling not present.        Left: Mass or swelling not present.     Epididymis:     Right: Normal. Not inflamed or enlarged.     Left: Not inflamed or enlarged.     Comments: There is diffuse erythema over the glans and the prepuce with scant white exudate over the glans.  There are no specific mucosal or epidermal lesions. Musculoskeletal:        General: Normal range of motion.     Cervical back: Neck supple.     Right lower leg: No edema.     Left lower leg: No edema.    Lymphadenopathy:     Cervical: No cervical adenopathy.  Skin:    General: Skin is warm and dry.     Coloration: Skin is not pale.     Findings: No rash.  Neurological:     General: No focal deficit present.     Mental Status: He is alert.  Psychiatric:        Mood and Affect: Mood normal.        Behavior: Behavior normal.     Lab Results  Component Value Date   WBC 4.3 09/24/2019   HGB 16.2 09/24/2019   HCT 49.3 09/24/2019   PLT 176.0 09/24/2019   GLUCOSE  342 (H) 09/24/2019   CHOL 195 03/05/2019   TRIG 152.0 (H) 03/05/2019   HDL 46.40 03/05/2019   LDLDIRECT 115.0 07/02/2019   LDLCALC 119 (H) 03/05/2019   ALT 25 07/02/2019   AST 16 07/02/2019   NA 137 09/24/2019   K 3.9 09/24/2019   CL 100 09/24/2019   CREATININE 1.21 09/24/2019   BUN 16 09/24/2019   CO2 26 09/24/2019   TSH 0.67 09/08/2016   PSA 2.70 09/24/2019   INR 1.37 09/26/2016   HGBA1C 11.5 (H) 09/24/2019   MICROALBUR <0.7 03/05/2019    No results found.  Assessment & Plan:   Dashon was seen today for hypertension, anemia and diabetes.  Diagnoses and all orders for this visit:  Essential hypertension-His blood pressure is adequately well controlled. -     Basic metabolic panel -     Urinalysis, Routine w reflex microscopic  Type 2 diabetes mellitus with complication, with long-term current use of insulin ([place])- His A1c is up to 11.5.  I think this is the causative factor for his frequent urination.  I recommended that he increase the dose of his insulin and to be more compliant with it.  I have also recommended referrals for chronic care management, endocrinology, and diabetic education. -     Basic metabolic panel -     Hemoglobin A1c -     Ambulatory referral to diabetic education -     Ambulatory referral to Endocrinology -     Consult to [facility] -     insulin aspart protamine- aspart (NOVOLOG MIX 70/30) (70-30) 100 UNIT/ML injection; 40 units before breakfast and 30 units before  supper  DVT, lower extremity, proximal, acute, right (HCC)  OAB (overactive bladder)- I recommended he try a higher dose of oxybutynin to see if this will help control the frequent urination. -     Urinalysis, Routine w reflex microscopic -     oxybutynin (DITROPAN XL) 15 MG 24 hr tablet; Take 1 tablet (15 mg total) by mouth at bedtime.  Vitamin D insufficiency -     Cholecalciferol 50 MCG (2000 UT) TABS; Take 1 tablet (2,000 Units total) by mouth daily.  Hypercoagulable state, primary [facility])-  Will screen him for hypercoagulopathy.  For now I recommended that he continue Xarelto but at the 10 mg a day dose.  Will change the dose or convert to Coumadin based on the hypercoagulable panel. -     rivaroxaban (XARELTO) 10 MG TABS tablet; Take 1 tablet (10 mg total) by mouth daily. -     Hypercoagulable panel, comprehensive  Benign prostatic hyperplasia (BPH) with straining on urination- I do not think this is contributing to his symptoms. -     Urinalysis, Routine w reflex microscopic -     PSA  Iron deficiency- His H&H are normal now. -     CBC with Differential  Candidal balanitis -     fluconazole (DIFLUCAN) 100 MG tablet; Take 1 tablet (100 mg total) by mouth daily for 10 days.   I have discontinued Nirvaan Frett. Kleman "A.D."'s oxybutynin and rivaroxaban. I have also changed his Cholecalciferol and insulin aspart protamine- aspart. Additionally, I am having him start on rivaroxaban, fluconazole, and oxybutynin. Lastly, I am having him maintain his TRUEplus Lancets 33G, Alcohol Swabs, multivitamin with minerals, ferrous sulfate, tamsulosin, Stendra, Easy Touch Pen Needles, OneTouch Delica Lancets 19F, glucose blood, OneTouch Verio Flex System, simvastatin, gabapentin, and [place].  Meds ordered this encounter  Medications  .  Cholecalciferol 50 MCG (2000 UT) TABS    Sig: Take 1 tablet (2,000 Units total) by mouth daily.    Dispense:  90 tablet    Refill:  1  . rivaroxaban (XARELTO) 10 MG  TABS tablet    Sig: Take 1 tablet (10 mg total) by mouth daily.    Dispense:  90 tablet    Refill:  1  . fluconazole (DIFLUCAN) 100 MG tablet    Sig: Take 1 tablet (100 mg total) by mouth daily for 10 days.    Dispense:  10 tablet    Refill:  1  . oxybutynin (DITROPAN XL) 15 MG 24 hr tablet    Sig: Take 1 tablet (15 mg total) by mouth at bedtime.    Dispense:  90 tablet    Refill:  1  . insulin aspart protamine- aspart (NOVOLOG MIX 70/30) (70-30) 100 UNIT/ML injection    Sig: 40 units before breakfast and 30 units before supper    Dispense:  1020 mL    Refill:  3     Follow-up: Return in about 3 months (around 12/23/2019).  Scarlette Calico, MD

## 2019-09-25 ENCOUNTER — Encounter: Payer: Self-pay | Admitting: Internal Medicine

## 2019-09-25 MED ORDER — INSULIN ASPART PROT & ASPART (70-30 MIX) 100 UNIT/ML ~~LOC~~ SUSP: SUBCUTANEOUS | 3 refills | Status: DC

## 2019-10-01 ENCOUNTER — Telehealth: Payer: Self-pay | Admitting: *Deleted

## 2019-10-01 NOTE — Telephone Encounter (Signed)
I spoke with [facility] rep re: Hypercoag panel test drawn on 09/24/19  Test it still pending & it looks like specimens were collected & received correctly.

## 2019-10-02 NOTE — Telephone Encounter (Signed)
Thank you.  TJ

## 2019-10-08 ENCOUNTER — Other Ambulatory Visit: Payer: Self-pay | Admitting: Internal Medicine

## 2019-10-08 ENCOUNTER — Ambulatory Visit: Payer: Medicare HMO | Admitting: Endocrinology

## 2019-10-08 DIAGNOSIS — R791 Abnormal coagulation profile: Secondary | ICD-10-CM | POA: Insufficient documentation

## 2019-10-08 DIAGNOSIS — D6859 Other primary thrombophilia: Secondary | ICD-10-CM

## 2019-10-08 LAB — HYPERCOAGULABLE PANEL, COMPREHENSIVE
APTT: 22.6 s — ABNORMAL LOW
AT III Act/Nor PPP Chro: 130 %
Act. Prt C Resist w/FV Defic.: 2.7 ratio
Anticardiolipin Ab, IgG: <10 [GPL'U]
Anticardiolipin Ab, IgM: 13 [MPL'U]
Beta-2 Glycoprotein I, IgA: <10 SAU
Beta-2 Glycoprotein I, IgG: <10 SGU
Beta-2 Glycoprotein I, IgM: <10 SMU
DRVVT Screen Seconds: 41.6 s
Factor VII Antigen**: 125 %
Factor VIII Activity: 292 % — ABNORMAL HIGH
Hexagonal Phospholipid Neutral: 1 s
Homocysteine: 13.3 umol/L
Prot C Ag Act/Nor PPP Imm: 161 % — ABNORMAL HIGH
Prot S Ag Act/Nor PPP Imm: 131 %
Protein C Ag/FVII Ag Ratio**: 1.3 ratio
Protein S Ag/FVII Ag Ratio**: 1 ratio

## 2019-10-29 ENCOUNTER — Other Ambulatory Visit: Payer: Self-pay

## 2019-10-29 ENCOUNTER — Encounter: Payer: Medicare HMO | Attending: Internal Medicine | Admitting: Skilled Nursing Facility1

## 2019-10-29 ENCOUNTER — Encounter: Payer: Self-pay | Admitting: Skilled Nursing Facility1

## 2019-10-29 DIAGNOSIS — Z794 Long term (current) use of insulin: Secondary | ICD-10-CM | POA: Insufficient documentation

## 2019-10-29 DIAGNOSIS — E118 Type 2 diabetes mellitus with unspecified complications: Secondary | ICD-10-CM | POA: Diagnosis not present

## 2019-10-29 NOTE — Progress Notes (Signed)
Pt reports his glucose meter has not been working, is getting another one soon. Pt reports missing breakfast this morning, not a usual occurrence. Pt reports he has been eating his vegetables.  Pt reports having to urniate frequebntly through the night. Pt reports exercising (walking daily)  Pt reports feeling good about his insulin, takes it every day stating his Dose was increased about a month ago.   Breakfast: BK or McD[employer] breakfast, Large Coke. Snack: Lance crackers (occasionally) Lunch: Bologna sandwich, 1 or 2 sandwiches (whole wheat bread) Snack: Lance crackers (occasionally) Dinner 6-6:30: [employer] collard greens, cabbage, 2 pieces of white fish, piece of cornbread, water  Beverages: water, coca cola, gatorade  Zero  Educated pt on choosing Diet soda to limit his sugar intake. Keep up the walks Continue checking your blood sugar once you get your meter. Limit sandwiches to one per meal Consult your doctor about removing sugary beverages from your diet, and that they may need to adjust your insulin.

## 2019-10-31 ENCOUNTER — Other Ambulatory Visit: Payer: Self-pay

## 2019-10-31 ENCOUNTER — Encounter: Payer: Self-pay | Admitting: Podiatry

## 2019-10-31 ENCOUNTER — Ambulatory Visit (INDEPENDENT_AMBULATORY_CARE_PROVIDER_SITE_OTHER): Payer: Medicare HMO | Admitting: Podiatry

## 2019-10-31 DIAGNOSIS — E1151 Type 2 diabetes mellitus with diabetic peripheral angiopathy without gangrene: Secondary | ICD-10-CM

## 2019-10-31 DIAGNOSIS — B351 Tinea unguium: Secondary | ICD-10-CM

## 2019-10-31 NOTE — Progress Notes (Signed)
Complaint:  Visit Type: Patient returns to my office for continued preventative foot care services. Complaint: Patient states" my nails have grown long and thick and become painful to walk and wear shoes" Patient has been diagnosed with DM with no foot complications. The patient presents for preventative foot care services. No changes to ROS.  Patient has not been seen in over 1 year.  Podiatric Exam: Vascular: dorsalis pedis and posterior tibial pulses are weakly  palpable bilateral. Capillary return is immediate. Temperature gradient is WNL. Skin turgor WNL  Sensorium: Normal Semmes Weinstein monofilament test. Normal tactile sensation bilaterally. Nail Exam: Pt has thick disfigured discolored nails with subungual debris noted bilateral entire nail hallux through fifth toenails Ulcer Exam: There is no evidence of ulcer or pre-ulcerative changes or infection. Orthopedic Exam: Muscle tone and strength are WNL. No limitations in general ROM. No crepitus or effusions noted. Foot type and digits show no abnormalities. DJD 1st MPJ with overlapping toe B/L. Skin: No Porokeratosis. No infection or ulcers  Diagnosis:  Onychomycosis, , Pain in right toe, pain in left toes  Treatment & Plan Procedures and Treatment: Consent by patient was obtained for treatment procedures.   Debridement of mycotic and hypertrophic toenails, 1 through 5 bilateral and clearing of subungual debris. No ulceration, no infection noted.  Return Visit-Office Procedure: Patient instructed to return to the office for a follow up visit 3 months for continued evaluation and treatment.    Gardiner Barefoot DPM

## 2019-11-09 DIAGNOSIS — R030 Elevated blood-pressure reading, without diagnosis of hypertension: Secondary | ICD-10-CM | POA: Diagnosis not present

## 2019-11-09 DIAGNOSIS — Z809 Family history of malignant neoplasm, unspecified: Secondary | ICD-10-CM | POA: Diagnosis not present

## 2019-11-09 DIAGNOSIS — E1142 Type 2 diabetes mellitus with diabetic polyneuropathy: Secondary | ICD-10-CM | POA: Diagnosis not present

## 2019-11-09 DIAGNOSIS — Z794 Long term (current) use of insulin: Secondary | ICD-10-CM | POA: Diagnosis not present

## 2019-11-09 DIAGNOSIS — Z7901 Long term (current) use of anticoagulants: Secondary | ICD-10-CM | POA: Diagnosis not present

## 2019-11-09 DIAGNOSIS — I4891 Unspecified atrial fibrillation: Secondary | ICD-10-CM | POA: Diagnosis not present

## 2019-11-09 DIAGNOSIS — D6869 Other thrombophilia: Secondary | ICD-10-CM | POA: Diagnosis not present

## 2019-11-09 DIAGNOSIS — E785 Hyperlipidemia, unspecified: Secondary | ICD-10-CM | POA: Diagnosis not present

## 2019-11-09 DIAGNOSIS — R32 Unspecified urinary incontinence: Secondary | ICD-10-CM | POA: Diagnosis not present

## 2019-11-09 DIAGNOSIS — N3281 Overactive bladder: Secondary | ICD-10-CM | POA: Diagnosis not present

## 2019-11-12 DIAGNOSIS — E119 Type 2 diabetes mellitus without complications: Secondary | ICD-10-CM | POA: Diagnosis not present

## 2019-11-12 DIAGNOSIS — H2513 Age-related nuclear cataract, bilateral: Secondary | ICD-10-CM | POA: Diagnosis not present

## 2019-11-12 DIAGNOSIS — Z794 Long term (current) use of insulin: Secondary | ICD-10-CM | POA: Diagnosis not present

## 2019-11-12 DIAGNOSIS — H40013 Open angle with borderline findings, low risk, bilateral: Secondary | ICD-10-CM | POA: Diagnosis not present

## 2019-11-12 DIAGNOSIS — H1711 Central corneal opacity, right eye: Secondary | ICD-10-CM | POA: Diagnosis not present

## 2019-11-13 ENCOUNTER — Ambulatory Visit: Payer: Medicare HMO | Admitting: Endocrinology

## 2019-11-29 DIAGNOSIS — R69 Illness, unspecified: Secondary | ICD-10-CM | POA: Diagnosis not present

## 2019-12-06 ENCOUNTER — Other Ambulatory Visit: Payer: Self-pay

## 2019-12-06 ENCOUNTER — Encounter: Payer: Self-pay | Admitting: Endocrinology

## 2019-12-06 ENCOUNTER — Ambulatory Visit (INDEPENDENT_AMBULATORY_CARE_PROVIDER_SITE_OTHER): Payer: Medicare HMO | Admitting: Endocrinology

## 2019-12-06 VITALS — BP 132/80 | HR 78 | Ht 68.5 in | Wt 203.4 lb

## 2019-12-06 DIAGNOSIS — Z794 Long term (current) use of insulin: Secondary | ICD-10-CM

## 2019-12-06 DIAGNOSIS — E118 Type 2 diabetes mellitus with unspecified complications: Secondary | ICD-10-CM

## 2019-12-06 DIAGNOSIS — I10 Essential (primary) hypertension: Secondary | ICD-10-CM

## 2019-12-06 DIAGNOSIS — E1165 Type 2 diabetes mellitus with hyperglycemia: Secondary | ICD-10-CM

## 2019-12-06 MED ORDER — GLUCOSE BLOOD VI STRP: ORAL_STRIP | 3 refills | Status: DC

## 2019-12-06 MED ORDER — NOVOLOG MIX 70/30 FLEXPEN (70-30) 100 UNIT/ML ~~LOC~~ SUPN: PEN_INJECTOR | SUBCUTANEOUS | 2 refills | Status: DC

## 2019-12-06 NOTE — Patient Instructions (Addendum)
Check blood sugars on waking up 3-4 days a week  Also check blood sugars about 2 hours after meals and do this after different meals by rotation  Recommended blood sugar levels on waking up are 90-130 and about 2 hours after meal is 130-180  Please bring your blood sugar monitor to each visit, thank you  Take 20 in am and 30 at dinner  Avoid instant oatmeal

## 2019-12-06 NOTE — Progress Notes (Signed)
Patient ID: Charles Parker, male   DOB: [birth year], 74 y.o.   MRN: [id number]          Reason for Appointment: Follow-up for Type 2 Diabetes  Referring physician: Scarlette Calico   History of Present Illness:          Date of diagnosis of type 2 diabetes mellitus: 2014        Background history:   He has been on various oral hypoglycemic drugs since the onset including metformin, Januvia and Amaryl Was also on Invokana in 2015 for about a year but not clear why this was stopped His level of control has been quite variable with highest A1c 13.6 in 06/2015  He has been on insulin since about 10/2014 as documented in his record  Recent history:   INSULIN regimen is:  NovoLog mix 20 units before breakfast and 30 units before dinner  Non-insulin hypoglycemic drugs the patient is taking QMG:[id number] 10 mg daily  His A1c is last 9.5, previously 8.3 Fructosamine is 337, previously higher  Current management, blood sugar patterns and problems identified:  He has not been seen in follow-up since 7/20  History is difficult to obtain and not clear what he is actually doing as far as diabetes management and insulin  He was previously taking 30 units of insulin in the morning and 20 at dinnertime and he appears to have reversed this doses  Also may not have been getting his insulin when seen by his PCP in December and his A1c was significantly higher with blood sugar 342  Checking blood sugars primarily fasting and difficult to know why these readings fluctuate; however in the last 2 weeks of February blood sugars were mostly between 126 and 160  He has not monitored his blood sugars since 2/25 as he is out of test strips  Also in the last week he has been out of insulin  He thinks he takes his [place] regularly and was given a prescription by PCP in December  His diet can be variable but he thinks that he is eating some lower fat meals such as lean cuisine compared to last visit  Also  he is eating flavored packets of oatmeal in the morning that are not sugar-free  His weight has gone up recently   Side effects from medications have been: Hives from metformin  Compliance with the medical regimen: Inconsistent  Glucose monitoring:  done< 1 times a   day usually        Glucometer: Accu-Chek guide      Blood Glucose readings by Download   PRE-MEAL Fasting Lunch Dinner Bedtime Overall  Glucose range:  126-271   105-127  240   Mean/median:  168     159   Previous readings : PRE-MEAL Fasting Lunch Dinner Bedtime Overall  Glucose range:  136-206   134-260    Mean/median:  161   189  ?   POST-MEAL PC Breakfast PC Lunch PC Dinner  Glucose range:   ?  Mean/median:       Self-care: The diet that the patient has been following is: tries to limit [employer] .      Typical meal intake: Breakfast is eggs/cereal or oatmeal usually, lunch:  Lean cuisine or sandwiches.  Dinner chicken and vegetables.   Snacks will be chips, peanut butter crackers Supper at 6 pm                Dietician visit, most recent:3/19  Exercise: walking 5 laps around parking lot  Weight history:  Wt Readings from Last 3 Encounters:  12/06/19 203 lb 6.4 oz (92.3 kg)  10/29/19 199 lb 4.8 oz (90.4 kg)  09/24/19 193 lb 8 oz (87.8 kg)    Glycemic control:   Lab Results  Component Value Date   HGBA1C 11.5 (H) 09/24/2019   HGBA1C 8.9 (H) 07/02/2019   HGBA1C 9.5 (H) 03/05/2019   Lab Results  Component Value Date   MICROALBUR <0.7 03/05/2019   LDLCALC 119 (H) 03/05/2019   CREATININE 1.21 09/24/2019   Lab Results  Component Value Date   MICRALBCREAT 0.7 03/05/2019    Lab Results  Component Value Date   FRUCTOSAMINE 337 (H) 04/30/2019   FRUCTOSAMINE 401 (H) 03/05/2019   FRUCTOSAMINE 330 (H) 05/05/2018      Allergies as of 12/06/2019      Reactions   Metformin And Related Diarrhea   Codeine Rash   All over the body      Medication List       Accurate as of  December 06, 2019 11:59 PM. If you have any questions, ask your nurse or doctor.        STOP taking these medications   NovoLIN 70/30 (70-30) 100 UNIT/ML injection Generic drug: insulin NPH-regular Human Stopped by: Elayne Snare, MD     TAKE these medications   Alcohol Swabs Pads Use to clean area before insulin injections. DX E11.8   Cholecalciferol 50 MCG (2000 UT) Tabs Take 1 tablet (2,000 Units total) by mouth daily.   Easy Touch Pen Needles 31G X 5 MM Misc Generic drug: Insulin Pen Needle ADMINISTER LANTUS ONCE EVERY EVENING   Farxiga 10 MG Tabs tablet Generic drug: dapagliflozin propanediol Take 10 mg by mouth daily.   ferrous sulfate 325 (65 FE) MG tablet Take 1 tablet (325 mg total) by mouth 2 (two) times daily with a meal.   gabapentin 300 MG capsule Commonly known as: NEURONTIN TAKE 1 CAPSULE(300 MG) BY MOUTH TWICE DAILY   glucose blood test strip Use Onetouch verio test strips as instructed to check blood sugar three times daily. What changed: Another medication with the same name was changed. Make sure you understand how and when to take each. Changed by: Jayme Cloud, LPN   glucose blood test strip Use Accu Chek Aviva Test Strips as instructed to check blood sugar three times daily. What changed:   how much to take  how to take this  when to take this Changed by: Jayme Cloud, LPN   multivitamin with minerals Tabs tablet Take 1 tablet by mouth daily.   NovoLOG Mix 70/30 FlexPen (70-30) 100 UNIT/ML FlexPen Generic drug: insulin aspart protamine - aspart Inject 20 units under the skin breakfast and 30 units at dinner. What changed:   how to take this  when to take this  Another medication with the same name was removed. Continue taking this medication, and follow the directions you see here. Changed by: Jayme Cloud, LPN   OneTouch Verio Flex System w/Device Kit 1 each by Does not apply route 3 (three) times daily. Use Onetouch Verio flex to check  blood sugar three times daily.   oxybutynin 15 MG 24 hr tablet Commonly known as: DITROPAN XL Take 1 tablet (15 mg total) by mouth at bedtime.   rivaroxaban 10 MG Tabs tablet Commonly known as: Xarelto Take 1 tablet (10 mg total) by mouth daily.   simvastatin 40 MG tablet Commonly known  as: ZOCOR TAKE 1 TABLET(40 MG) BY MOUTH AT BEDTIME   Stendra 200 MG Tabs Generic drug: Avanafil Take 1 tablet by mouth every 3 (three) days.   tamsulosin 0.4 MG Caps capsule Commonly known as: FLOMAX TAKE 1 CAPSULE(0.4 MG) BY MOUTH DAILY AFTER BREAKFAST   TRUEplus Lancets 33G Misc USE TO CHECK BLOOD SUGARS THREE TIMES DAY   OneTouch Delica Lancets 73U Misc 1 each by Does not apply route 3 (three) times daily. Use Onetouch delica lancets to check blood sugar three times daily.       Allergies:  Allergies  Allergen Reactions  . Metformin And Related Diarrhea  . Codeine Rash    All over the body    Past Medical History:  Diagnosis Date  . Clotting disorder (HCC)    DVT  . Diabetes mellitus    type II  . Diverticulosis   . DVT (deep venous thrombosis) ([place])   . Glaucoma    no eye drops   . Hepatic cyst   . Hypercholesterolemia   . Hypertension   . Renal cyst   . Sleep apnea   . Tubulovillous adenoma     Past Surgical History:  Procedure Laterality Date  . BLADDER NECK RECONSTRUCTION  02/03/2012   Procedure: BLADDER NECK REPAIR;  Surgeon: Adin Hector, MD;  Location: [facility] ORS;  Service: General;;  . COLONOSCOPY  02/2013   polyp removal(mult.)  . KNEE SURGERY  2004   left  . POLYPECTOMY    . PROCTOSCOPY  02/03/2012   Procedure: PROCTOSCOPY;  Surgeon: Adin Hector, MD;  Location: [facility] ORS;  Service: General;;  . STOMACH SURGERY  02/2012  . TEE WITHOUT CARDIOVERSION N/A 10/05/2016   Procedure: TRANSESOPHAGEAL ECHOCARDIOGRAM (TEE);  Surgeon: Sanda Klein, MD;  Location: [facility] ENDOSCOPY;  Service: Cardiovascular;  Laterality: N/A;    Family History  Problem Relation Age of  Onset  . Hypertension Mother   . Diabetes Mother   . Cancer Mother        ? location  . Colon cancer Father 55  . Colon polyps Neg Hx   . Liver cancer Neg Hx     Social History:  reports that he has never smoked. He has never used smokeless tobacco. He reports that he does not drink alcohol or use drugs.   Review of Systems   Lipid history: Has been taking Zocor 40 mg with variable control, followed by PCP Last LDL is higher    Lab Results  Component Value Date   CHOL 195 03/05/2019   CHOL 165 03/03/2018   CHOL 161 12/15/2016   Lab Results  Component Value Date   HDL 46.40 03/05/2019   HDL 39.20 03/03/2018   HDL 52.10 12/15/2016   Lab Results  Component Value Date   LDLCALC 119 (H) 03/05/2019   LDLCALC 91 03/03/2018   LDLCALC 87 12/15/2016   Lab Results  Component Value Date   TRIG 152.0 (H) 03/05/2019   TRIG 172.0 (H) 03/03/2018   TRIG 108.0 12/15/2016   Lab Results  Component Value Date   CHOLHDL 4 03/05/2019   CHOLHDL 4 03/03/2018   CHOLHDL 3 12/15/2016   Lab Results  Component Value Date   LDLDIRECT 115.0 07/02/2019   LDLDIRECT 118.0 11/11/2015   LDLDIRECT 201.0 03/04/2015             Most recent foot exam: 10/19  RENAL function is stable with using Farxiga 10 mg  Lab Results  Component Value Date   CREATININE  1.21 09/24/2019   CREATININE 1.15 07/02/2019   CREATININE 1.14 04/30/2019    His blood pressure is controlled, not on any specific treatment  BP Readings from Last 3 Encounters:  12/06/19 132/80  09/24/19 134/84  05/02/19 128/80      Physical Examination:  BP 132/80 (BP Location: Left Arm, Patient Position: Sitting, Cuff Size: Large)   Pulse 78   Ht 5' 8.5" (1.74 m)   Wt 203 lb 6.4 oz (92.3 kg)   SpO2 98%   BMI 30.48 kg/m      No ankle edema present    ASSESSMENT:  Diabetes type 2  See history of present illness for detailed discussion of current diabetes management, blood sugar patterns and problems  identified   He is on a regimen of premixed insulin twice a day and Farxiga  His A1c was last 11.5  No recent labs available to objectively assess his control  He is a poor historian and difficult to know what his compliance level is for his insulin, [place] and diet He has not taken insulin for a week and likely did not take it in December when his A1c was higher by about 2% He is taking more insulin in the evening than in the morning compared to the previous visit and he keeps adjusting this on his own  Not clear if he is benefiting from [place] which he says he is taking regularly  Blood sugar monitoring is inconsistent and recently mostly in the mornings only  HYPERTENSION: Controlled with [place] Glade only currently  We will recheck microalbumin on next visit  PLAN:    Advised him to keep regular follow-up  Since he does not have consistently high readings at any given time recently will not change his insulin dose  Make sure he takes [place] in the morning daily  Discussed timing of taking insulin before meals  Will have him check his blood sugars after lunch or dinner on a regular basis and every other day in the morning  Explained blood sugar targets before and after meals  He will need to call if he is starting to get any low blood sugars  Since he recently is using Accu-Chek meter and he prefers this over One Touch he can continue and new prescription given Check A1c on the next visit in 2 months  Patient Instructions  Check blood sugars on waking up 3-4 days a week  Also check blood sugars about 2 hours after meals and do this after different meals by rotation  Recommended blood sugar levels on waking up are 90-130 and about 2 hours after meal is 130-180  Please bring your blood sugar monitor to each visit, thank you  Take 20 in am and 30 at dinner  Avoid instant oatmeal       Elayne Snare 12/07/2019, 2:51 PM   Note: This office note was prepared with  Dragon voice recognition system technology. Any transcriptional errors that result from this process are unintentional.

## 2019-12-12 ENCOUNTER — Other Ambulatory Visit: Payer: Self-pay | Admitting: *Deleted

## 2019-12-12 NOTE — Patient Outreach (Signed)
[facility]) Care Management  [place]  12/12/2019   Charles Parker July 09, 1946 [id number]   RN Health Coach telephone call to patient.  [place] compliance verified. Per patient he is doing fair. Patient fasting blood sugar is 155. His A1C is up. RN discussed what the patient is doing that made it increase. Patient is eating honeynut cheerios. RN explained that he needs to eat plain cheerios. Patient is eating oatmeal with maple sugar and flavors. RN explained that the DR does not want him to eat that because it has to much sugar in it. Patient eats cornbread when he goes out. RN explained that he needs to cut it in half if he  is going to eat it. The patient told RN Health Coach what insulin dosage he is taking.  He still has it incorrect. He is taking his dose the opposite of what the Dr has told him. He is having a hard time getting the doses correctly. RN did a memory game to help patient to remember which one to take. Patient has agreed to follow up outreach calls.   Encounter Medications:  Outpatient Encounter Medications as of 12/12/2019  Medication Sig  . Alcohol Swabs PADS Use to clean area before insulin injections. DX E11.8  . Avanafil (STENDRA) 200 MG TABS Take 1 tablet by mouth every 3 (three) days.  . Blood Glucose Monitoring Suppl (ONETOUCH VERIO FLEX SYSTEM) w/Device KIT 1 each by Does not apply route 3 (three) times daily. Use Onetouch Verio flex to check blood sugar three times daily.  . Cholecalciferol 50 MCG (2000 UT) TABS Take 1 tablet (2,000 Units total) by mouth daily.  . dapagliflozin propanediol (FARXIGA) 10 MG TABS tablet Take 10 mg by mouth daily.  . ferrous sulfate 325 (65 FE) MG tablet Take 1 tablet (325 mg total) by mouth 2 (two) times daily with a meal.  . gabapentin (NEURONTIN) 300 MG capsule TAKE 1 CAPSULE(300 MG) BY MOUTH TWICE DAILY  . glucose blood test strip Use Onetouch verio test strips as instructed to check blood sugar three times  daily.  Marland Kitchen glucose blood test strip Use Accu Chek Aviva Test Strips as instructed to check blood sugar three times daily.  . insulin aspart protamine - aspart (NOVOLOG MIX 70/30 FLEXPEN) (70-30) 100 UNIT/ML FlexPen Inject 20 units under the skin breakfast and 30 units at dinner.  . Insulin Pen Needle (EASY TOUCH PEN NEEDLES) 31G X 5 MM MISC ADMINISTER LANTUS ONCE EVERY EVENING  . Multiple Vitamin (MULTIVITAMIN WITH MINERALS) TABS tablet Take 1 tablet by mouth daily.  [name] Lancets 66Q MISC 1 each by Does not apply route 3 (three) times daily. Use Onetouch delica lancets to check blood sugar three times daily.  Marland Kitchen oxybutynin (DITROPAN XL) 15 MG 24 hr tablet Take 1 tablet (15 mg total) by mouth at bedtime.  . rivaroxaban (XARELTO) 10 MG TABS tablet Take 1 tablet (10 mg total) by mouth daily.  . simvastatin (ZOCOR) 40 MG tablet TAKE 1 TABLET(40 MG) BY MOUTH AT BEDTIME  . tamsulosin (FLOMAX) 0.4 MG CAPS capsule TAKE 1 CAPSULE(0.4 MG) BY MOUTH DAILY AFTER BREAKFAST  . TRUEPLUS LANCETS 33G MISC USE TO CHECK BLOOD SUGARS THREE TIMES DAY   No facility-administered encounter medications on file as of 12/12/2019.    Functional Status:  In your present state of health, do you have any difficulty performing the following activities: 12/12/2019 09/13/2019  Hearing? N N  Vision? N N  Difficulty concentrating  or making decisions? N N  Walking or climbing stairs? Y N  Comment patient uses a cane -  Dressing or bathing? N N  Doing errands, shopping? N N  Preparing Food and eating ? N N  Using the Toilet? N N  In the past six months, have you accidently leaked urine? N N  Do you have problems with loss of bowel control? N N  Managing your Medications? Y N  Comment patient is having difficulty remembering to that 20u in am and 30u after dinner. -  Managing your Finances? N N  Housekeeping or managing your Housekeeping? N N  Some recent data might be hidden    Fall/Depression Screening: Fall  Risk  12/12/2019 09/13/2019 06/19/2019  Falls in the past year? 0 0 0  Number falls in past yr: 0 0 0  Comment - - -  Injury with Fall? 0 0 0  Risk Factor Category  - - -  Risk for fall due to : Impaired balance/gait;Impaired mobility - -  Follow up Falls evaluation completed;Falls prevention discussed Falls evaluation completed Falls evaluation completed   PHQ 2/9 Scores 12/12/2019 06/19/2019 05/24/2019 04/18/2019 03/30/2019 03/29/2019 01/04/2018  PHQ - 2 Score 0 0 0 0 0 0 1  PHQ- 9 Score - - - - - - 3   THN CM Care Plan Problem One     Most Recent Value  Care Plan Problem One  Knowledge Deficit in Self Management of Diabetes  Role Documenting the Problem One  [place] for Problem One  Active  THN Long Term Goal   11.5  THN Long Term Goal Start Date  12/12/19  Interventions for Problem One Long Term Goal  RN discussed what the patient A1C is now up to 11.5. RN discussed the patient diet of eating honey nut cheerios and drinking sodas. RN discussed what the Dr wants his blood sugar ranges to be. RN reiterated what insulin doses the Dr wants him to take.RN will follow up with furthe discussion  THN CM Short Term Goal #3  Patient will follow up with Health Maintenance within the next 30days  THN CM Short Term Goal #3 Start Date  12/12/19  Interventions for Short Tern Goal #3  RN discussed Health maintenance and when patient is to folloe up for next A1C and Next foot exam. RN will follow up for compliance  THN CM Short Term Goal #4  Patient will verbalize taking his insulin correctly within the next 30 days  THN CM Short Term Goal #4 Start Date  12/12/19  Interventions for Short Term Goal #4  RN discussed the directions that the physician had given him to take his insulin. RN will follow up with further discussion and monitoring.       Assessment:  Patient is mixing his am and pm insulin dose up. He has received 1st COVID vaccine A1C 11.5 Fasting blood sugar 155 Exercising daily  of 5 laps around [place] parking lot Nutrition classes 10/06/2019 Patient still needs dietary reinforcement Patient will continue to benefit from [place] telephonic outreach for education and support for diabetes self management.  Plan:  RN discussed patient diet RN discussed A1C and fasting blood sugars RN discussed the importance of checking blood sugars and documenting RN discussed the correct of insulin he is to take 20 am 30 pm RN initiated a memory game to help patient to remember correct dose RN will follow up within the month of May  Charles Parker  [place] Care Management [phone]  RN

## 2020-01-15 ENCOUNTER — Other Ambulatory Visit: Payer: Self-pay | Admitting: *Deleted

## 2020-01-15 NOTE — Patient Outreach (Addendum)
[facility]) Care Management   01/15/2020   LONG BRIMAGE 06-13-1946 [id number]  [place] telephone call to patient.  Hipaa compliance verified. Per patient  fasting blood sugar is 160. RN Health coach asked the patient for the insulin dosages he has taken. Per patient he took 25 units because he was near the end of his pen. He stated he would take 32 tonight. RN explained to the patient that is not what the Dr ordered. Your dose is 20 am and 30 pm. You are not suppose to change the dosage without the Dr being aware. RN discussed with the patient about how hard it will be for the Dr to know what dosage to increase or decrease if you are giving yourself different dosages. Per patient he will take as ordered and write the dosage down with the blood sugars so the Dr will know what his readings are and how to treat him. Patient has agreed to further outreach calls.    Encounter Medications:  Outpatient Encounter Medications as of 01/15/2020  Medication Sig  . Alcohol Swabs PADS Use to clean area before insulin injections. DX E11.8  . Avanafil (STENDRA) 200 MG TABS Take 1 tablet by mouth every 3 (three) days.  . Blood Glucose Monitoring Suppl (ONETOUCH VERIO FLEX SYSTEM) w/Device KIT 1 each by Does not apply route 3 (three) times daily. Use Onetouch Verio flex to check blood sugar three times daily.  . Cholecalciferol 50 MCG (2000 UT) TABS Take 1 tablet (2,000 Units total) by mouth daily.  . dapagliflozin propanediol (FARXIGA) 10 MG TABS tablet Take 10 mg by mouth daily.  . ferrous sulfate 325 (65 FE) MG tablet Take 1 tablet (325 mg total) by mouth 2 (two) times daily with a meal.  . gabapentin (NEURONTIN) 300 MG capsule TAKE 1 CAPSULE(300 MG) BY MOUTH TWICE DAILY  . glucose blood test strip Use Onetouch verio test strips as instructed to check blood sugar three times daily.  Marland Kitchen glucose blood test strip Use Accu Chek Aviva Test Strips as instructed to check blood sugar  three times daily.  . insulin aspart protamine - aspart (NOVOLOG MIX 70/30 FLEXPEN) (70-30) 100 UNIT/ML FlexPen Inject 20 units under the skin breakfast and 30 units at dinner.  . Insulin Pen Needle (EASY TOUCH PEN NEEDLES) 31G X 5 MM MISC ADMINISTER LANTUS ONCE EVERY EVENING  . Multiple Vitamin (MULTIVITAMIN WITH MINERALS) TABS tablet Take 1 tablet by mouth daily.  [name] Lancets 49F MISC 1 each by Does not apply route 3 (three) times daily. Use Onetouch delica lancets to check blood sugar three times daily.  Marland Kitchen oxybutynin (DITROPAN XL) 15 MG 24 hr tablet Take 1 tablet (15 mg total) by mouth at bedtime.  . rivaroxaban (XARELTO) 10 MG TABS tablet Take 1 tablet (10 mg total) by mouth daily.  . simvastatin (ZOCOR) 40 MG tablet TAKE 1 TABLET(40 MG) BY MOUTH AT BEDTIME  . tamsulosin (FLOMAX) 0.4 MG CAPS capsule TAKE 1 CAPSULE(0.4 MG) BY MOUTH DAILY AFTER BREAKFAST  . TRUEPLUS LANCETS 33G MISC USE TO CHECK BLOOD SUGARS THREE TIMES DAY   No facility-administered encounter medications on file as of 01/15/2020.    Functional Status:  In your present state of health, do you have any difficulty performing the following activities: 12/12/2019 09/13/2019  Hearing? N N  Vision? N N  Difficulty concentrating or making decisions? N N  Walking or climbing stairs? Y N  Comment patient uses a cane -  Dressing or bathing? N N  Doing errands, shopping? N N  Preparing Food and eating ? N N  Using the Toilet? N N  In the past six months, have you accidently leaked urine? N N  Do you have problems with loss of bowel control? N N  Managing your Medications? Y N  Comment patient is having difficulty remembering to that 20u in am and 30u after dinner. -  Managing your Finances? N N  Housekeeping or managing your Housekeeping? N N  Some recent data might be hidden    Fall/Depression Screening: Fall Risk  12/12/2019 09/13/2019 06/19/2019  Falls in the past year? 0 0 0  Number falls in past yr: 0 0 0   Comment - - -  Injury with Fall? 0 0 0  Risk Factor Category  - - -  Risk for fall due to : Impaired balance/gait;Impaired mobility - -  Follow up Falls evaluation completed;Falls prevention discussed Falls evaluation completed Falls evaluation completed   PHQ 2/9 Scores 12/12/2019 06/19/2019 05/24/2019 04/18/2019 03/30/2019 03/29/2019 01/04/2018  PHQ - 2 Score 0 0 0 0 0 0 1  PHQ- 9 Score - - - - - - 3   THN CM Care Plan Problem One     Most Recent Value  Care Plan Problem One  Knowledge Deficit in Self Management of Diabetes  Role Documenting the Problem One  [place] for Problem One  Active  THN Long Term Goal   11.5  Interventions for Problem One Long Term Goal  RN reiterated what the patient blood sugar is today 160 and what his A1C is. RN discussed medication adherence. Patient has not been taking the dose prescribed by the Dr. He has been self dosing. RN discussed the importance of following what the Dr has ordered so that the Dr will know if he needs to adjust the medication. RN will follow up with further monitoring.  THN CM Short Term Goal #3  Patient will follow up with Health Maintenance within the next 30days  Interventions for Short Tern Goal #3  RN reiterates health maintenance. Patient received COVID vaccine. RN discusses keeping eye care and foot care and vaccines up to date. RN will follow up and continue to remind patient of the health maintenance needed.  THN CM Short Term Goal #4  Patient will verbalize taking his insulin correctly within the next 30 days  Interventions for Short Term Goal #4  RN reiterated to the patient to take the dose the Dr ordered. The patient has still been changing his doses. RN explained why he needs to do what the Dr orders and with his A1C elevated how the Dr needs to know what he is taking and if he needs to make changes. RNwill follow up with further monitoring and discussion.       Assessment:  Patient is not taking his medications  as prescribed Patient is exercising daily Patient appetite is good Patient is monitoring his blood sugars Plan:  RN discussed with the patient the importance of medication adherence RN reiterated the dosages the physician ordered RN discussed documenting the blood sugars and the dose he is taking RN discussed what the A1C is and his blood sugars are running RN routed this information to PCP RN will follow up within the month of June  Rishav Rockefeller [place] [facility] [phone]

## 2020-02-04 ENCOUNTER — Other Ambulatory Visit (INDEPENDENT_AMBULATORY_CARE_PROVIDER_SITE_OTHER): Payer: Medicare HMO

## 2020-02-04 ENCOUNTER — Other Ambulatory Visit: Payer: Self-pay

## 2020-02-04 DIAGNOSIS — Z794 Long term (current) use of insulin: Secondary | ICD-10-CM | POA: Diagnosis not present

## 2020-02-04 DIAGNOSIS — E1165 Type 2 diabetes mellitus with hyperglycemia: Secondary | ICD-10-CM | POA: Diagnosis not present

## 2020-02-05 LAB — BASIC METABOLIC PANEL
BUN: 16 mg/dL (ref 6–23)
CO2: 26 mEq/L (ref 19–32)
Calcium: 9.1 mg/dL (ref 8.4–10.5)
Chloride: 107 mEq/L (ref 96–112)
Creatinine, Ser: 1.15 mg/dL (ref 0.40–1.50)
GFR: 75.23 mL/min (ref >60.00)
Glucose, Bld: 164 mg/dL — ABNORMAL HIGH (ref 70–99)
Potassium: 4 mEq/L (ref 3.5–5.1)
Sodium: 140 mEq/L (ref 135–145)

## 2020-02-05 LAB — MICROALBUMIN / CREATININE URINE RATIO
Creatinine,U: 90.7 mg/dL
Microalb Creat Ratio: 0.8 mg/g (ref 0.0–30.0)
Microalb, Ur: <0.7 mg/dL (ref 0.0–1.9)

## 2020-02-05 LAB — HEMOGLOBIN A1C: Hgb A1c MFr Bld: 7.8 % — ABNORMAL HIGH (ref 4.6–6.5)

## 2020-02-06 DIAGNOSIS — R69 Illness, unspecified: Secondary | ICD-10-CM | POA: Diagnosis not present

## 2020-02-07 ENCOUNTER — Ambulatory Visit: Payer: Medicare HMO | Admitting: Endocrinology

## 2020-02-08 ENCOUNTER — Other Ambulatory Visit: Payer: Self-pay

## 2020-02-08 ENCOUNTER — Ambulatory Visit (INDEPENDENT_AMBULATORY_CARE_PROVIDER_SITE_OTHER): Payer: Medicare HMO | Admitting: Endocrinology

## 2020-02-08 ENCOUNTER — Encounter: Payer: Self-pay | Admitting: Endocrinology

## 2020-02-08 VITALS — BP 134/70 | HR 91 | Ht 68.5 in | Wt 204.4 lb

## 2020-02-08 DIAGNOSIS — E7800 Pure hypercholesterolemia, unspecified: Secondary | ICD-10-CM

## 2020-02-08 DIAGNOSIS — Z794 Long term (current) use of insulin: Secondary | ICD-10-CM

## 2020-02-08 DIAGNOSIS — E1165 Type 2 diabetes mellitus with hyperglycemia: Secondary | ICD-10-CM | POA: Diagnosis not present

## 2020-02-08 DIAGNOSIS — E1140 Type 2 diabetes mellitus with diabetic neuropathy, unspecified: Secondary | ICD-10-CM | POA: Diagnosis not present

## 2020-02-08 NOTE — Progress Notes (Signed)
Patient ID: Charles Parker, male   DOB: 04-16-1946, 74 y.o.   MRN: [id number]          Reason for Appointment: Follow-up for Type 2 Diabetes  Referring physician: Scarlette Calico   History of Present Illness:          Date of diagnosis of type 2 diabetes mellitus: 2014        Background history:   He has been on various oral hypoglycemic drugs since the onset including metformin, Januvia and Amaryl Was also on Invokana in 2015 for about a year but not clear why this was stopped His level of control has been quite variable with highest A1c 13.6 in 06/2015  He has been on insulin since about 10/2014 as documented in his record  Recent history:   INSULIN regimen is:  NovoLog mix 20 units before breakfast and 30 units before dinner  Non-insulin hypoglycemic drugs the patient is taking BZX:YDSWVTV 10 mg daily  His A1c is better than usual at 7.8, last 11.5   Current management, blood sugar patterns and problems identified:  He is here for short-term follow-up since he did not have enough blood sugar readings to assess his management on the last visit in March  However he still taking only 1 blood sugar a day on an average  His blood sugars are quite variable at all times  He thinks he is trying to take his insulin consistently before breakfast and dinnertime  He also demonstrated on the demonstration device the correct dose of the insulin that he takes  Highest blood sugar is 328 and he is not clear what foods or drinks make it go up  Sometimes will get into sweets or desserts  Not checking readings after meals as discussed previously likely has only 1 or 2 readings after dinner  He is trying to do a little walking but not able to lose any weight  No hypoglycemic symptoms and lowest blood sugar 74 last night  He has been out of his test trips and has no readings for the last 6 days  Afternoon blood sugar was 168 in the lab  Has not been missed getting his refills for his  Farxiga    Side effects from medications have been: Hives from metformin  Compliance with the medical regimen: Inconsistent  Glucose monitoring:  done< 1 times a   day usually        Glucometer: Accu-Chek guide      Blood Glucose readings by Download   PRE-MEAL Fasting Lunch Dinner Bedtime Overall  Glucose range:  74-251   113-295    Mean/median:  150   151   161   POST-MEAL PC Breakfast PC Lunch PC Dinner  Glucose range:    178, 328  Mean/median:      Previous readings:  PRE-MEAL Fasting Lunch Dinner Bedtime Overall  Glucose range:  126-271   105-127  240   Mean/median:  168     159     Self-care: The diet that the patient has been following is: tries to limit [employer] .      Typical meal intake: Breakfast is eggs/cereal or oatmeal usually, lunch:  Lean cuisine or sandwiches.  Dinner chicken and vegetables.   Snacks will be chips, peanut butter crackers Supper at 6 pm                Dietician visit, most recent:3/19  Exercise: walking 5 laps around parking lot  Weight history:  Wt Readings from Last 3 Encounters:  02/08/20 204 lb 6.4 oz (92.7 kg)  12/06/19 203 lb 6.4 oz (92.3 kg)  10/29/19 199 lb 4.8 oz (90.4 kg)    Glycemic control:   Lab Results  Component Value Date   HGBA1C 7.8 (H) 02/04/2020   HGBA1C 11.5 (H) 09/24/2019   HGBA1C 8.9 (H) 07/02/2019   Lab Results  Component Value Date   MICROALBUR <0.7 02/04/2020   LDLCALC 119 (H) 03/05/2019   CREATININE 1.15 02/04/2020   Lab Results  Component Value Date   MICRALBCREAT 0.8 02/04/2020    Lab Results  Component Value Date   FRUCTOSAMINE 337 (H) 04/30/2019   FRUCTOSAMINE 401 (H) 03/05/2019   FRUCTOSAMINE 330 (H) 05/05/2018      Allergies as of 02/08/2020      Reactions   Metformin And Related Diarrhea   Codeine Rash   All over the body      Medication List       Accurate as of Feb 08, 2020  4:16 PM. If you have any questions, ask your nurse or doctor.          Alcohol Swabs Pads Use to clean area before insulin injections. DX E11.8   Cholecalciferol 50 MCG (2000 UT) Tabs Take 1 tablet (2,000 Units total) by mouth daily.   Easy Touch Pen Needles 31G X 5 MM Misc Generic drug: Insulin Pen Needle ADMINISTER LANTUS ONCE EVERY EVENING   Farxiga 10 MG Tabs tablet Generic drug: dapagliflozin propanediol Take 10 mg by mouth daily.   ferrous sulfate 325 (65 FE) MG tablet Take 1 tablet (325 mg total) by mouth 2 (two) times daily with a meal.   gabapentin 300 MG capsule Commonly known as: NEURONTIN TAKE 1 CAPSULE(300 MG) BY MOUTH TWICE DAILY   glucose blood test strip Use Onetouch verio test strips as instructed to check blood sugar three times daily.   glucose blood test strip Use Accu Chek Aviva Test Strips as instructed to check blood sugar three times daily.   multivitamin with minerals Tabs tablet Take 1 tablet by mouth daily.   NovoLOG Mix 70/30 FlexPen (70-30) 100 UNIT/ML FlexPen Generic drug: insulin aspart protamine - aspart Inject 20 units under the skin breakfast and 30 units at dinner.   OneTouch Verio Flex System w/Device Kit 1 each by Does not apply route 3 (three) times daily. Use Onetouch Verio flex to check blood sugar three times daily.   oxybutynin 15 MG 24 hr tablet Commonly known as: DITROPAN XL Take 1 tablet (15 mg total) by mouth at bedtime.   rivaroxaban 10 MG Tabs tablet Commonly known as: Xarelto Take 1 tablet (10 mg total) by mouth daily.   simvastatin 40 MG tablet Commonly known as: ZOCOR TAKE 1 TABLET(40 MG) BY MOUTH AT BEDTIME   Stendra 200 MG Tabs Generic drug: Avanafil Take 1 tablet by mouth every 3 (three) days.   tamsulosin 0.4 MG Caps capsule Commonly known as: FLOMAX TAKE 1 CAPSULE(0.4 MG) BY MOUTH DAILY AFTER BREAKFAST   TRUEplus Lancets 33G Misc USE TO CHECK BLOOD SUGARS THREE TIMES DAY   OneTouch Delica Lancets 29U Misc 1 each by Does not apply route 3 (three) times daily. Use  Onetouch delica lancets to check blood sugar three times daily.       Allergies:  Allergies  Allergen Reactions  . Metformin And Related Diarrhea  . Codeine Rash    All over  the body    Past Medical History:  Diagnosis Date  . Clotting disorder (HCC)    DVT  . Diabetes mellitus    type II  . Diverticulosis   . DVT (deep venous thrombosis) ([place])   . Glaucoma    no eye drops   . Hepatic cyst   . Hypercholesterolemia   . Hypertension   . Renal cyst   . Sleep apnea   . Tubulovillous adenoma     Past Surgical History:  Procedure Laterality Date  . BLADDER NECK RECONSTRUCTION  02/03/2012   Procedure: BLADDER NECK REPAIR;  Surgeon: Adin Hector, MD;  Location: [facility] ORS;  Service: General;;  . COLONOSCOPY  02/2013   polyp removal(mult.)  . KNEE SURGERY  2004   left  . POLYPECTOMY    . PROCTOSCOPY  02/03/2012   Procedure: PROCTOSCOPY;  Surgeon: Adin Hector, MD;  Location: [facility] ORS;  Service: General;;  . STOMACH SURGERY  02/2012  . TEE WITHOUT CARDIOVERSION N/A 10/05/2016   Procedure: TRANSESOPHAGEAL ECHOCARDIOGRAM (TEE);  Surgeon: Sanda Klein, MD;  Location: [facility] ENDOSCOPY;  Service: Cardiovascular;  Laterality: N/A;    Family History  Problem Relation Age of Onset  . Hypertension Mother   . Diabetes Mother   . Cancer Mother        ? location  . Colon cancer Father 66  . Colon polyps Neg Hx   . Liver cancer Neg Hx     Social History:  reports that he has never smoked. He has never used smokeless tobacco. He reports that he does not drink alcohol or use drugs.   Review of Systems   Lipid history: Has been taking Zocor 40 mg with variable control, followed by PCP Last LDL is higher and he has no follow-up    Lab Results  Component Value Date   CHOL 195 03/05/2019   CHOL 165 03/03/2018   CHOL 161 12/15/2016   Lab Results  Component Value Date   HDL 46.40 03/05/2019   HDL 39.20 03/03/2018   HDL 52.10 12/15/2016   Lab Results  Component Value Date    LDLCALC 119 (H) 03/05/2019   LDLCALC 91 03/03/2018   LDLCALC 87 12/15/2016   Lab Results  Component Value Date   TRIG 152.0 (H) 03/05/2019   TRIG 172.0 (H) 03/03/2018   TRIG 108.0 12/15/2016   Lab Results  Component Value Date   CHOLHDL 4 03/05/2019   CHOLHDL 4 03/03/2018   CHOLHDL 3 12/15/2016   Lab Results  Component Value Date   LDLDIRECT 115.0 07/02/2019   LDLDIRECT 118.0 11/11/2015   LDLDIRECT 201.0 03/04/2015           He has history of neuropathy taking gabapentin prescribed by PCP  Most recent foot exam: 03/2019  RENAL function is stable with using Farxiga 10 mg  Lab Results  Component Value Date   CREATININE 1.15 02/04/2020   CREATININE 1.21 09/24/2019   CREATININE 1.15 07/02/2019    His blood pressure is controlled, not on any treatment  BP Readings from Last 3 Encounters:  02/08/20 134/70  12/06/19 132/80  09/24/19 134/84      Physical Examination:  BP 134/70 (BP Location: Left Arm, Patient Position: Sitting, Cuff Size: Normal)   Pulse 91   Ht 5' 8.5" (1.74 m)   Wt 204 lb 6.4 oz (92.7 kg)   SpO2 92%   BMI 30.63 kg/m       ASSESSMENT:  Diabetes type 2  See history of  present illness for detailed discussion of current diabetes management, blood sugar patterns and problems identified   He is on a regimen of premixed insulin twice a day and Farxiga  His A1c is now 7.8  He has finally started checking his blood sugars regularly although ran out of test strips a week ago He is complaining about not wanting to use the One Touch meter but wants to continue his old Accu-Chek meter which may or may not be accurate  He is a poor historian and difficult to know why his blood sugars at home are fluctuating between 74 and 345 Recently fasting readings are lower but no hypoglycemia Also not clear what his blood sugars are after dinner with only 1 or 2 readings at that time Overall blood sugars still better than usual He appears to be getting the  accurate amount of insulin that was confirmed today in the office and he feels that he is consistent with taking this twice a day  Considering his level of understanding he still can benefit from a simple premixed twice a day insulin regimen  Discussed staying consistently active with walking daily Also needs to avoid high carbohydrate and high sugar content in his diet Discussed blood sugar targets more before and after meals  HYPERTENSION: Controlled with Wilder Glade only currently  LIPIDS: Last LDL was high and he needs to schedule follow-up with his PCP  PLAN:   Advised him on when to monitor his blood sugars especially after meals To call if he has more consistent low normal readings below 90 Continue same dose of insulin for now Continue Farxiga since renal function is stable Dietary modifications as above    Patient Instructions  Check blood sugars on waking up 3-4 days a week  Also check blood sugars about 2 hours after meals and do this after different meals by rotation  Recommended blood sugar levels on waking up are 90-130 and about 2 hours after meal is 130-160  Please bring your blood sugar monitor to each visit, thank you  Avoid fried food        Elayne Snare 02/08/2020, 4:16 PM   Note: This office note was prepared with Dragon voice recognition system technology. Any transcriptional errors that result from this process are unintentional.

## 2020-02-08 NOTE — Patient Instructions (Addendum)
Check blood sugars on waking up 3-4 days a week  Also check blood sugars about 2 hours after meals and do this after different meals by rotation  Recommended blood sugar levels on waking up are 90-130 and about 2 hours after meal is 130-160  Please bring your blood sugar monitor to each visit, thank you  Avoid fried food

## 2020-02-12 ENCOUNTER — Telehealth: Payer: Self-pay | Admitting: Internal Medicine

## 2020-02-12 ENCOUNTER — Other Ambulatory Visit: Payer: Self-pay

## 2020-02-12 MED ORDER — GLUCOSE BLOOD VI STRP: ORAL_STRIP | 3 refills | Status: DC

## 2020-02-12 NOTE — Telephone Encounter (Signed)
Reviewed chart and Dr. Ronnie Derby office fills this medication. Accu Chek was the last test strip that was sent in.

## 2020-02-12 NOTE — Telephone Encounter (Signed)
Per pt's request, this Rx has been refilled and sent to the pharmacy listed below.

## 2020-02-12 NOTE — Telephone Encounter (Signed)
Patient is requesting a refill on the following : glucose blood test strip  [facility], [place] [address] Phone:  [phone]  Fax:  [phone]

## 2020-02-14 ENCOUNTER — Telehealth: Payer: Self-pay

## 2020-02-14 MED ORDER — GLUCOSE BLOOD VI STRP: ORAL_STRIP | 3 refills | Status: DC

## 2020-02-14 NOTE — Telephone Encounter (Signed)
glucose blood test strip 100 each 3 02/14/2020    Sig: Use Accu Chek Aviva Test Strips as instructed to check blood sugar three times daily.   Sent to pharmacy as: glucose blood test strip   E-Prescribing Status: Receipt confirmed by pharmacy (02/14/2020  4:41 PM EDT)

## 2020-02-14 NOTE — Telephone Encounter (Signed)
Pt's chart does indicated that a receipt confirmation was received by the pharmacy as indicated below.  Disp Refills Start End   glucose blood test strip 100 each 3 02/12/2020    Sig: Use Accu Chek Aviva Test Strips as instructed to check blood sugar three times daily.   Sent to pharmacy as: glucose blood test strip   E-Prescribing Status: Receipt confirmed by pharmacy (02/12/2020  4:03 PM EDT)    However, Rx wsa resent.

## 2020-02-14 NOTE — Telephone Encounter (Signed)
Patient called stating his pharmacy never got the RX for the accu chek test strips - requesting it be resent.  [facility] [place], [address] Phone:  [phone]  Fax:  [phone]

## 2020-02-18 ENCOUNTER — Other Ambulatory Visit: Payer: Self-pay

## 2020-02-18 ENCOUNTER — Other Ambulatory Visit: Payer: Self-pay | Admitting: Internal Medicine

## 2020-02-18 DIAGNOSIS — E785 Hyperlipidemia, unspecified: Secondary | ICD-10-CM

## 2020-02-18 DIAGNOSIS — E1165 Type 2 diabetes mellitus with hyperglycemia: Secondary | ICD-10-CM

## 2020-02-18 DIAGNOSIS — Z794 Long term (current) use of insulin: Secondary | ICD-10-CM

## 2020-02-18 MED ORDER — GLUCOSE BLOOD VI STRP: ORAL_STRIP | 3 refills | Status: DC

## 2020-02-18 NOTE — Telephone Encounter (Signed)
Patient called requesting the refill for the accu chek test strips to please be resent to the [facility] on [address]   [address],  [address] Ph# [phone]

## 2020-02-18 NOTE — Telephone Encounter (Signed)
Accu chek test strips sent into to the [place] at [facility].

## 2020-03-04 ENCOUNTER — Ambulatory Visit: Payer: Medicare HMO | Admitting: Podiatry

## 2020-03-05 ENCOUNTER — Other Ambulatory Visit: Payer: Self-pay | Admitting: Internal Medicine

## 2020-03-05 DIAGNOSIS — E1165 Type 2 diabetes mellitus with hyperglycemia: Secondary | ICD-10-CM

## 2020-03-05 DIAGNOSIS — Z794 Long term (current) use of insulin: Secondary | ICD-10-CM

## 2020-03-05 DIAGNOSIS — E118 Type 2 diabetes mellitus with unspecified complications: Secondary | ICD-10-CM

## 2020-03-06 ENCOUNTER — Other Ambulatory Visit: Payer: Self-pay | Admitting: *Deleted

## 2020-03-06 NOTE — Patient Outreach (Signed)
[facility]) Care Management  03/06/2020  DELMUS SCHLENDER July 20, 1946 [id number]   RN Health Coach attempted follow up outreach call to patient.  Patient was unavailable. HIPPA compliance voicemail message left with return callback number.  Plan: RN will call patient again within 30 days.  [place] [phone]

## 2020-03-25 ENCOUNTER — Telehealth: Payer: Self-pay

## 2020-03-25 DIAGNOSIS — N3281 Overactive bladder: Secondary | ICD-10-CM

## 2020-03-25 MED ORDER — OXYBUTYNIN CHLORIDE ER 15 MG PO TB24
15.0000 mg | ORAL_TABLET | Freq: Every day | ORAL | 1 refills | Status: DC
Start: 2020-03-25 — End: 2020-11-17

## 2020-03-25 NOTE — Telephone Encounter (Signed)
Medication Refill - Medication: oxybutynin (DITROPAN XL) 15 MG 24 hr tablet   Has the patient contacted their pharmacy? Yes.   (Agent: If no, request that the patient contact the pharmacy for the refill.) (Agent: If yes, when and what did the pharmacy advise?)  Preferred Pharmacy (with phone number or street name): [place], [place]  Agent: Please be advised that RX refills may take up to 3 business days. We ask that you follow-up with your pharmacy.

## 2020-03-25 NOTE — Telephone Encounter (Signed)
Erx sent as requested.  

## 2020-04-02 ENCOUNTER — Encounter: Payer: Self-pay | Admitting: Podiatry

## 2020-04-02 ENCOUNTER — Other Ambulatory Visit: Payer: Self-pay

## 2020-04-02 ENCOUNTER — Ambulatory Visit (INDEPENDENT_AMBULATORY_CARE_PROVIDER_SITE_OTHER): Payer: Medicare HMO | Admitting: Podiatry

## 2020-04-02 DIAGNOSIS — M2042 Other hammer toe(s) (acquired), left foot: Secondary | ICD-10-CM

## 2020-04-02 DIAGNOSIS — M2011 Hallux valgus (acquired), right foot: Secondary | ICD-10-CM | POA: Diagnosis not present

## 2020-04-02 DIAGNOSIS — M205X2 Other deformities of toe(s) (acquired), left foot: Secondary | ICD-10-CM

## 2020-04-02 DIAGNOSIS — B351 Tinea unguium: Secondary | ICD-10-CM

## 2020-04-02 DIAGNOSIS — M2012 Hallux valgus (acquired), left foot: Secondary | ICD-10-CM | POA: Diagnosis not present

## 2020-04-02 DIAGNOSIS — E1151 Type 2 diabetes mellitus with diabetic peripheral angiopathy without gangrene: Secondary | ICD-10-CM | POA: Diagnosis not present

## 2020-04-02 DIAGNOSIS — M2041 Other hammer toe(s) (acquired), right foot: Secondary | ICD-10-CM | POA: Diagnosis not present

## 2020-04-02 NOTE — Progress Notes (Signed)
This patient returns to my office for at risk foot care.  This patient requires this care by a professional since this patient will be at risk due to having diabetes type 2 and coagulation defect.  Patient is taking xarelto.  This patient is unable to cut nails himself since the patient cannot reach his nails.These nails are painful walking and wearing shoes.  This patient presents for at risk foot care today.  General Appearance  Alert, conversant and in no acute stress.  Vascular  Dorsalis pedis and posterior tibial  pulses are weakly palpable  bilaterally.  Capillary return is within normal limits  bilaterally. Temperature is within normal limits  bilaterally.  Neurologic  Senn-Weinstein monofilament wire test within normal limits  bilaterally. Muscle power within normal limits bilaterally.  Nails Thick disfigured discolored nails with subungual debris  from hallux to fifth toes bilaterally.  Orthopedic  No limitations of motion  feet .  No crepitus or effusions noted.  HAV with overlapping second digit  B/L.  Skin  normotropic skin with no porokeratosis noted bilaterally.  No signs of infections or ulcers noted.     Onychomycosis  Pain in right toes  Pain in left toes  Consent was obtained for treatment procedures.   Mechanical debridement of nails 1-5  bilaterally performed with a nail nipper.  Filed with dremel without incident.  Patient qualifies for diabetic shoes due to diabetes ,HAV and hammer toes  B/L. Patient to make an appointment with the pedorthist.   Return office visit     3 months                Told patient to return for periodic foot care and evaluation due to potential at risk complications.   Gardiner Barefoot DPM

## 2020-04-08 ENCOUNTER — Encounter: Payer: Self-pay | Admitting: *Deleted

## 2020-04-08 ENCOUNTER — Other Ambulatory Visit: Payer: Self-pay | Admitting: *Deleted

## 2020-04-08 NOTE — Patient Outreach (Signed)
[facility]) Care Management  [place]  04/08/2020   Charles Parker 1946/07/23 [id number]   [place] telephone call to patient.  Hipaa compliance verified. Per patient he had an episode of hypoglycemia. Patient had taken medications and did not eat all day. Patient passed out. Blood sugar was 48 when paramedic checked. Paramedic helped patient get blood sugar up to 90 and patient ate meal. Patient has not been taking insulin as directed. Per patient sometimes he takes the 20 units in the morning and 30 at dinner and sometimes he takes 30 in morning and 20 at dinner. RN discussed with patient the appropriate dose. RN discussed with patient about eating in the morning. RN discussed with patient that you can't take your insulin and not eat. Patient has agreed to follow up outreach calls.   Encounter Medications:  Outpatient Encounter Medications as of 04/08/2020  Medication Sig   Alcohol Swabs PADS Use to clean area before insulin injections. DX E11.8   Avanafil (STENDRA) 200 MG TABS Take 1 tablet by mouth every 3 (three) days.   Blood Glucose Monitoring Suppl (ONETOUCH VERIO FLEX SYSTEM) w/Device KIT 1 each by Does not apply route 3 (three) times daily. Use Onetouch Verio flex to check blood sugar three times daily.   Cholecalciferol 50 MCG (2000 UT) TABS Take 1 tablet (2,000 Units total) by mouth daily.   FARXIGA 10 MG TABS tablet TAKE 1 TABLET BY MOUTH DAILY   ferrous sulfate 325 (65 FE) MG tablet Take 1 tablet (325 mg total) by mouth 2 (two) times daily with a meal.   gabapentin (NEURONTIN) 300 MG capsule TAKE 1 CAPSULE(300 MG) BY MOUTH TWICE DAILY   glucose blood test strip Use Accu Chek Aviva Test Strips as instructed to check blood sugar three times daily.   insulin aspart protamine - aspart (NOVOLOG MIX 70/30 FLEXPEN) (70-30) 100 UNIT/ML FlexPen Inject 20 units under the skin breakfast and 30 units at dinner.   Insulin Pen Needle (EASY TOUCH PEN  NEEDLES) 31G X 5 MM MISC ADMINISTER LANTUS ONCE EVERY EVENING   Multiple Vitamin (MULTIVITAMIN WITH MINERALS) TABS tablet Take 1 tablet by mouth daily.   OneTouch Delica Lancets 22V MISC 1 each by Does not apply route 3 (three) times daily. Use Onetouch delica lancets to check blood sugar three times daily.   oxybutynin (DITROPAN XL) 15 MG 24 hr tablet Take 1 tablet (15 mg total) by mouth at bedtime.   rivaroxaban (XARELTO) 10 MG TABS tablet Take 1 tablet (10 mg total) by mouth daily.   simvastatin (ZOCOR) 40 MG tablet TAKE 1 TABLET(40 MG) BY MOUTH AT BEDTIME   tamsulosin (FLOMAX) 0.4 MG CAPS capsule TAKE 1 CAPSULE(0.4 MG) BY MOUTH DAILY AFTER BREAKFAST   TRUEPLUS LANCETS 33G MISC USE TO CHECK BLOOD SUGARS THREE TIMES DAY   [DISCONTINUED] dapagliflozin propanediol (FARXIGA) 10 MG TABS tablet Take 10 mg by mouth daily.   No facility-administered encounter medications on file as of 04/08/2020.    Functional Status:  In your present state of health, do you have any difficulty performing the following activities: 12/12/2019 09/13/2019  Hearing? N N  Vision? N N  Difficulty concentrating or making decisions? N N  Walking or climbing stairs? Y N  Comment patient uses a cane -  Dressing or bathing? N N  Doing errands, shopping? N N  Preparing Food and eating ? N N  Using the Toilet? N N  In the past six months, have you  accidently leaked urine? N N  Do you have problems with loss of bowel control? N N  Managing your Medications? Y N  Comment patient is having difficulty remembering to that 20u in am and 30u after dinner. -  Managing your Finances? N N  Housekeeping or managing your Housekeeping? N N  Some recent data might be hidden    Fall/Depression Screening: Fall Risk  04/08/2020 12/12/2019 09/13/2019  Falls in the past year? 1 0 0  Number falls in past yr: 0 0 0  Comment - - -  Injury with Fall? 0 0 0  Risk Factor Category  - - -  Risk for fall due to : Impaired  balance/gait;Impaired mobility Impaired balance/gait;Impaired mobility -  Follow up Falls evaluation completed;Education provided;Falls prevention discussed Falls evaluation completed;Falls prevention discussed Falls evaluation completed   PHQ 2/9 Scores 12/12/2019 06/19/2019 05/24/2019 04/18/2019 03/30/2019 03/29/2019 01/04/2018  PHQ - 2 Score 0 0 0 0 0 0 1  PHQ- 9 Score - - - - - - 3   Goals Addressed            This Visit's Progress    LIFESTYLE - EAT BREAKFAST       CARE PLAN ENTRY (see longtitudinal plan of care for additional care plan information)  Objective:  Lab Results  Component Value Date   HGBA1C 7.8 (H) 02/04/2020    Lab Results  Component Value Date   CREATININE 1.15 02/04/2020   CREATININE 1.21 09/24/2019   CREATININE 1.15 07/02/2019     No results found for: EGFR Patient had an episode of blood sugar dropping to 48 due to taking his medications but not eating breakfast.  Current Barriers:   Knowledge Deficits related to medications used for management of diabetes  Cognitive Deficits  Case Manager Clinical Goal(s):  Over the next 30 days, patient will demonstrate improved adherence to prescribed treatment plan for diabetes self care/management as evidenced by:   Verbalize daily monitoring and recording of CBG within 30 days  Verbalize adherence to ADA/ carb modified diet within the next 30 days  Verbalize adherence to prescribed medication regimen within the next 30 days   Interventions:   Reviewed medications with patient and discussed importance of medication adherence  Reviewed scheduled/upcoming provider appointments including: labs to drawn  Advised patient, providing education and rationale, to check cbg four times and record, calling Dr if consistently less than 90 for findings outside established parameters.    Patient Self Care Activities:   Self administers injectable DM medication (20u in am and 30u at dinner) as prescribed  Attends all  scheduled provider appointments  Checks blood sugars as prescribed and utilize hyper and hypoglycemia protocol as needed  Adheres to prescribed ADA/carb modified        Assessment:  A1C 7.8 down for 11.5 Patient had hypoglycemic episode Patient fell due to low blood sugar (passed out) Patient is not taking medication as per ordered Patient has an exercise routine Plan:  RN discussed hypoglycemia RN discussed eating appropriately RN discussed medication adherence RN will follow up within the month of August  [place] Management [phone]

## 2020-04-25 ENCOUNTER — Ambulatory Visit (INDEPENDENT_AMBULATORY_CARE_PROVIDER_SITE_OTHER): Payer: Medicare HMO | Admitting: Orthotics

## 2020-04-25 ENCOUNTER — Other Ambulatory Visit: Payer: Self-pay

## 2020-04-25 DIAGNOSIS — M2042 Other hammer toe(s) (acquired), left foot: Secondary | ICD-10-CM

## 2020-04-25 DIAGNOSIS — E1151 Type 2 diabetes mellitus with diabetic peripheral angiopathy without gangrene: Secondary | ICD-10-CM

## 2020-04-25 DIAGNOSIS — M2011 Hallux valgus (acquired), right foot: Secondary | ICD-10-CM

## 2020-04-25 DIAGNOSIS — M2012 Hallux valgus (acquired), left foot: Secondary | ICD-10-CM | POA: Diagnosis not present

## 2020-04-25 DIAGNOSIS — M2041 Other hammer toe(s) (acquired), right foot: Secondary | ICD-10-CM | POA: Diagnosis not present

## 2020-04-25 DIAGNOSIS — B351 Tinea unguium: Secondary | ICD-10-CM

## 2020-04-25 NOTE — Progress Notes (Signed)

## 2020-05-09 ENCOUNTER — Other Ambulatory Visit: Payer: Self-pay | Admitting: *Deleted

## 2020-05-09 NOTE — Patient Outreach (Signed)
[facility]) Care Management  05/09/2020  Charles Parker 02-18-1946 [id number]  [place] telephone call to patient.  Hipaa compliance verified. Per patient he is sitting outside talking with a neighbor and asked to be called back next week.  [place] Care Management [phone]

## 2020-05-12 ENCOUNTER — Other Ambulatory Visit: Payer: Self-pay

## 2020-05-12 ENCOUNTER — Other Ambulatory Visit (INDEPENDENT_AMBULATORY_CARE_PROVIDER_SITE_OTHER): Payer: Medicare HMO

## 2020-05-12 DIAGNOSIS — Z794 Long term (current) use of insulin: Secondary | ICD-10-CM

## 2020-05-12 DIAGNOSIS — E1165 Type 2 diabetes mellitus with hyperglycemia: Secondary | ICD-10-CM

## 2020-05-12 DIAGNOSIS — E7800 Pure hypercholesterolemia, unspecified: Secondary | ICD-10-CM | POA: Diagnosis not present

## 2020-05-12 LAB — RENAL FUNCTION PANEL
Albumin: 4.4 g/dL (ref 3.5–5.2)
BUN: 16 mg/dL (ref 6–23)
CO2: 23 mEq/L (ref 19–32)
Calcium: 9.4 mg/dL (ref 8.4–10.5)
Chloride: 104 mEq/L (ref 96–112)
Creatinine, Ser: 1.2 mg/dL (ref 0.40–1.50)
GFR: 71.57 mL/min (ref >60.00)
Glucose, Bld: 273 mg/dL — ABNORMAL HIGH (ref 70–99)
Phosphorus: 3.6 mg/dL (ref 2.3–4.6)
Potassium: 4 mEq/L (ref 3.5–5.1)
Sodium: 138 mEq/L (ref 135–145)

## 2020-05-12 LAB — LIPID PANEL
Cholesterol: 202 mg/dL — ABNORMAL HIGH (ref 0–200)
HDL: 50.8 mg/dL (ref >39.00)
LDL Cholesterol: 124 mg/dL — ABNORMAL HIGH (ref 0–99)
NonHDL: 151.14
Total CHOL/HDL Ratio: 4
Triglycerides: 134 mg/dL (ref 0.0–149.0)
VLDL: 26.8 mg/dL (ref 0.0–40.0)

## 2020-05-12 LAB — COMPREHENSIVE METABOLIC PANEL
ALT: 23 U/L (ref 0–53)
AST: 18 U/L (ref 0–37)
Albumin: 4.4 g/dL (ref 3.5–5.2)
Alkaline Phosphatase: 61 U/L (ref 39–117)
BUN: 16 mg/dL (ref 6–23)
CO2: 23 mEq/L (ref 19–32)
Calcium: 9.4 mg/dL (ref 8.4–10.5)
Chloride: 104 mEq/L (ref 96–112)
Creatinine, Ser: 1.2 mg/dL (ref 0.40–1.50)
GFR: 71.57 mL/min (ref >60.00)
Glucose, Bld: 273 mg/dL — ABNORMAL HIGH (ref 70–99)
Potassium: 4 mEq/L (ref 3.5–5.1)
Sodium: 138 mEq/L (ref 135–145)
Total Bilirubin: 0.4 mg/dL (ref 0.2–1.2)
Total Protein: 7.5 g/dL (ref 6.0–8.3)

## 2020-05-12 LAB — HEMOGLOBIN A1C: Hgb A1c MFr Bld: 8.5 % — ABNORMAL HIGH (ref 4.6–6.5)

## 2020-05-15 ENCOUNTER — Ambulatory Visit: Payer: Medicare HMO | Admitting: Endocrinology

## 2020-05-27 ENCOUNTER — Ambulatory Visit (INDEPENDENT_AMBULATORY_CARE_PROVIDER_SITE_OTHER): Payer: Medicare HMO | Admitting: Endocrinology

## 2020-05-27 ENCOUNTER — Other Ambulatory Visit: Payer: Self-pay

## 2020-05-27 ENCOUNTER — Encounter: Payer: Self-pay | Admitting: Endocrinology

## 2020-05-27 VITALS — BP 136/74 | HR 77 | Ht 68.5 in | Wt 204.2 lb

## 2020-05-27 DIAGNOSIS — Z794 Long term (current) use of insulin: Secondary | ICD-10-CM

## 2020-05-27 DIAGNOSIS — E7800 Pure hypercholesterolemia, unspecified: Secondary | ICD-10-CM

## 2020-05-27 DIAGNOSIS — E1165 Type 2 diabetes mellitus with hyperglycemia: Secondary | ICD-10-CM | POA: Diagnosis not present

## 2020-05-27 LAB — POCT GLUCOSE (DEVICE FOR HOME USE): Glucose Fasting, POC: 344 mg/dL — AB (ref 70–99)

## 2020-05-27 NOTE — Patient Instructions (Addendum)
Take 26 units in am and at supper   Check blood sugars on waking up 2-3 days a week  Also check blood sugars about 2 hours after meals and do this after different meals by rotation  Recommended blood sugar levels on waking up are 90-130 and about 2 hours after meal is 130-180  Please bring your blood sugar monitor to each visit, thank you  Call Dr [name] for appt.  Bring all meds to Dr appts

## 2020-05-27 NOTE — Progress Notes (Signed)
Patient ID: Charles Parker, male   DOB: 12/25/1945, 74 y.o.   MRN: [id number] °       ° ° °Reason for Appointment: Follow-up for Type 2 Diabetes ° °Referring physician: Thomas Jones ° ° °History of Present Illness:  °        °Date of diagnosis of type 2 diabetes mellitus: 2014       ° °Background history:  ° °He has been on various oral hypoglycemic drugs since the onset including metformin, Januvia and Amaryl °Was also on Invokana in 2015 for about a year but not clear why this was stopped °His level of control has been quite variable with highest A1c 13.6 in 06/2015 ° °He has been on insulin since about 10/2014 as documented in his record ° °Recent history:  ° °INSULIN regimen is:  NovoLog mix 20 units before breakfast and 30 units before dinner ° °Non-insulin hypoglycemic drugs the patient is taking are:Farxiga 10 mg daily ° °His A1c is back up to 8.5 compared to 7.8  ° ° °Current management, blood sugar patterns and problems identified: °· He again is not checking blood sugars as directed and he claims that his prescription was not refilled °· However his blood sugars both in the lab and today in the office appear to be significantly higher midday and afternoon °· Previously blood sugars will fluctuate significantly both morning and before dinnertime °· He thinks he is taking his insulin before breakfast and dinnertime consistently as directed °· Also he does not think he has been running out of his Farxiga °· Weight is unchanged °· Not clear why his sugars over 300 after lunch today, he says he does not drink any drinks containing sugar and only ate a sandwich °· He is trying to do a little walking but limited for various reasons ° ° °Side effects from medications have been: Hives from metformin ° °Compliance with the medical regimen: Inconsistent ° °Glucose monitoring:  done< 1 times a   day usually        Glucometer: Accu-Chek guide      °Blood Glucose readings not available ° °Previous readings: ° °PRE-MEAL  Fasting Lunch Dinner Bedtime Overall  °Glucose range:  74-251   113-295    °Mean/median:  150   151   161  ° °POST-MEAL PC Breakfast PC Lunch PC Dinner  °Glucose range:    178, 328  °Mean/median:     ° ° ° °Self-care: The diet that the patient has been following is: tries to limit Fried foods .  °    °Typical meal intake: Breakfast is eggs/cereal or oatmeal usually, lunch:  Lean cuisine or sandwiches.  Dinner chicken and vegetables.   °Snacks will be chips, peanut butter crackers °Supper at 6 pm  °              °Dietician visit, most recent:3/19 °              °Exercise: walking up to 5 laps around parking lot ° °Weight history: ° °Wt Readings from Last 3 Encounters:  °05/27/20 204 lb 3.2 oz (92.6 kg)  °02/08/20 204 lb 6.4 oz (92.7 kg)  °12/06/19 203 lb 6.4 oz (92.3 kg)  ° ° °Glycemic control: °  °Lab Results  °Component Value Date  ° HGBA1C 8.5 (H) 05/12/2020  ° HGBA1C 7.8 (H) 02/04/2020  ° HGBA1C 11.5 (H) 09/24/2019  ° °Lab Results  °Component Value Date  ° MICROALBUR <0.7 02/04/2020  ° LDLCALC 124 (H)   05/12/2020   CREATININE 1.20 05/12/2020   CREATININE 1.20 05/12/2020   Lab Results  Component Value Date   MICRALBCREAT 0.8 02/04/2020    Lab Results  Component Value Date   FRUCTOSAMINE 337 (H) 04/30/2019   FRUCTOSAMINE 401 (H) 03/05/2019   FRUCTOSAMINE 330 (H) 05/05/2018      Allergies as of 05/27/2020      Reactions   Metformin And Related Diarrhea   Codeine Rash   All over the body      Medication List       Accurate as of May 27, 2020  4:42 PM. If you have any questions, ask your nurse or doctor.        Alcohol Swabs Pads Use to clean area before insulin injections. DX E11.8   Cholecalciferol 50 MCG (2000 UT) Tabs Take 1 tablet (2,000 Units total) by mouth daily.   Easy Touch Pen Needles 31G X 5 MM Misc Generic drug: Insulin Pen Needle ADMINISTER LANTUS ONCE EVERY EVENING   Farxiga 10 MG Tabs tablet Generic drug: dapagliflozin propanediol TAKE 1 TABLET BY MOUTH  DAILY   ferrous sulfate 325 (65 FE) MG tablet Take 1 tablet (325 mg total) by mouth 2 (two) times daily with a meal.   gabapentin 300 MG capsule Commonly known as: NEURONTIN TAKE 1 CAPSULE(300 MG) BY MOUTH TWICE DAILY   glucose blood test strip Use Accu Chek Aviva Test Strips as instructed to check blood sugar three times daily.   multivitamin with minerals Tabs tablet Take 1 tablet by mouth daily.   NovoLOG Mix 70/30 FlexPen (70-30) 100 UNIT/ML FlexPen Generic drug: insulin aspart protamine - aspart Inject 20 units under the skin breakfast and 30 units at dinner.   OneTouch Verio Flex System w/Device Kit 1 each by Does not apply route 3 (three) times daily. Use Onetouch Verio flex to check blood sugar three times daily.   oxybutynin 15 MG 24 hr tablet Commonly known as: DITROPAN XL Take 1 tablet (15 mg total) by mouth at bedtime.   rivaroxaban 10 MG Tabs tablet Commonly known as: Xarelto Take 1 tablet (10 mg total) by mouth daily.   simvastatin 40 MG tablet Commonly known as: ZOCOR TAKE 1 TABLET(40 MG) BY MOUTH AT BEDTIME   Stendra 200 MG Tabs Generic drug: Avanafil Take 1 tablet by mouth every 3 (three) days.   tamsulosin 0.4 MG Caps capsule Commonly known as: FLOMAX TAKE 1 CAPSULE(0.4 MG) BY MOUTH DAILY AFTER BREAKFAST   TRUEplus Lancets 33G Misc USE TO CHECK BLOOD SUGARS THREE TIMES DAY   OneTouch Delica Lancets 20B Misc 1 each by Does not apply route 3 (three) times daily. Use Onetouch delica lancets to check blood sugar three times daily.       Allergies:  Allergies  Allergen Reactions   Metformin And Related Diarrhea   Codeine Rash    All over the body    Past Medical History:  Diagnosis Date   Clotting disorder ([place])    DVT   Diabetes mellitus    type II   Diverticulosis    DVT (deep venous thrombosis) ([facility])    Glaucoma    no eye drops    Hepatic cyst    Hypercholesterolemia    Hypertension    Renal cyst    Sleep apnea     Tubulovillous adenoma     Past Surgical History:  Procedure Laterality Date   BLADDER NECK RECONSTRUCTION  02/03/2012   Procedure: BLADDER NECK REPAIR;  Surgeon: Revonda Standard.  Gross, MD;  Location: [facility] ORS;  Service: General;;  °• COLONOSCOPY  02/2013  ° polyp removal(mult.)  °• KNEE SURGERY  2004  ° left  °• POLYPECTOMY    °• PROCTOSCOPY  02/03/2012  ° Procedure: PROCTOSCOPY;  Surgeon: Steven C. Gross, MD;  Location: [facility] ORS;  Service: General;;  °• STOMACH SURGERY  02/2012  °• TEE WITHOUT CARDIOVERSION N/A 10/05/2016  ° Procedure: TRANSESOPHAGEAL ECHOCARDIOGRAM (TEE);  Surgeon: Mihai Croitoru, MD;  Location: [facility];  Service: Cardiovascular;  Laterality: N/A;  ° ° °Family History  °Problem Relation Age of Onset  °• Hypertension Mother   °• Diabetes Mother   °• Cancer Mother   °     ? location  °• Colon cancer Father 91  °• Colon polyps Neg Hx   °• Liver cancer Neg Hx   ° ° °Social History:  reports that he has never smoked. He has never used smokeless tobacco. He reports that he does not drink alcohol and does not use drugs. ° ° °Review of Systems ° ° °Lipid history: Has been taking Zocor 40 mg with variable control, followed by PCP °He thinks he is taking this regularly but his LDL stays relatively high ° °  °Lab Results  °Component Value Date  ° CHOL 202 (H) 05/12/2020  ° CHOL 195 03/05/2019  ° CHOL 165 03/03/2018  ° °Lab Results  °Component Value Date  ° HDL 50.80 05/12/2020  ° HDL 46.40 03/05/2019  ° HDL 39.20 03/03/2018  ° °Lab Results  °Component Value Date  ° LDLCALC 124 (H) 05/12/2020  ° LDLCALC 119 (H) 03/05/2019  ° LDLCALC 91 03/03/2018  ° °Lab Results  °Component Value Date  ° TRIG 134.0 05/12/2020  ° TRIG 152.0 (H) 03/05/2019  ° TRIG 172.0 (H) 03/03/2018  ° °Lab Results  °Component Value Date  ° CHOLHDL 4 05/12/2020  ° CHOLHDL 4 03/05/2019  ° CHOLHDL 4 03/03/2018  ° °Lab Results  °Component Value Date  ° LDLDIRECT 115.0 07/02/2019  ° LDLDIRECT 118.0 11/11/2015  ° LDLDIRECT 201.0 03/04/2015  ° °     °    He has history of neuropathy taking gabapentin prescribed by PCP ° °Most recent foot exam: 03/2019 ° °RENAL function is stable with using Farxiga 10 mg ° °Lab Results  °Component Value Date  ° CREATININE 1.20 05/12/2020  ° CREATININE 1.20 05/12/2020  ° CREATININE 1.15 02/04/2020  ° ° °His blood pressure is near normal, not on any treatment ° °BP Readings from Last 3 Encounters:  °05/27/20 136/74  °02/08/20 134/70  °12/06/19 132/80  ° ° ° ° °Physical Examination: ° °BP 136/74 (BP Location: Left Arm, Patient Position: Sitting, Cuff Size: Normal)    Pulse 77    Ht 5' 8.5" (1.74 m)    Wt 204 lb 3.2 oz (92.6 kg)    SpO2 92%    BMI 30.60 kg/m²  ° ° °   °ASSESSMENT: ° °Diabetes type 2 on insulin ° °See history of present illness for detailed discussion of current diabetes management, blood sugar patterns and problems identified °  °He is on a regimen of premixed insulin twice a day and Farxiga ° °His A1c is now 8.5 ° °He has again not checked his blood glucose at home °During the day blood glucose appears to be high even without his going off the diet apparently °He is reportedly compliant with his insulin regimen ° °Blood pressure: Controlled with Farxiga only  ° °LIPIDS: Again LDL was high and he needs to   schedule follow-up with his PCP  PLAN:   Increase morning insulin to 26 To compensate for morning dose increase adjust his evening dose down to 26 also Also needs to start checking his blood sugars regularly at various times of the discussed New prescription for the strips will be sent Also he will need to bring his medications sent to make sure he is taking [place] regularly Call if having consistently high readings  He will need to bring his medications with him for his follow-up visit and make sure he is taking his simvastatin otherwise would likely need to switch to 40 mg Crestor or combined with Zetia  Patient Instructions  Take 26 units in am and at supper   Check blood sugars on waking up 2-3 days a  week  Also check blood sugars about 2 hours after meals and do this after different meals by rotation  Recommended blood sugar levels on waking up are 90-130 and about 2 hours after meal is 130-180  Please bring your blood sugar monitor to each visit, thank you  Call Dr [name] for appt.      Elayne Snare 05/27/2020, 4:42 PM   Note: This office note was prepared with Dragon voice recognition system technology. Any transcriptional errors that result from this process are unintentional.

## 2020-05-29 ENCOUNTER — Other Ambulatory Visit: Payer: Self-pay | Admitting: *Deleted

## 2020-05-29 NOTE — Patient Outreach (Signed)
[facility]) Care Management  05/29/2020  Charles Parker 1946/08/25 [id number]   RN [facility] telephone call to patient.  Hipaa compliance verified. Per patient he is walking and would like for call back.  [place] Care Management [phone]

## 2020-05-30 ENCOUNTER — Other Ambulatory Visit: Payer: Self-pay | Admitting: Internal Medicine

## 2020-05-30 DIAGNOSIS — E1165 Type 2 diabetes mellitus with hyperglycemia: Secondary | ICD-10-CM

## 2020-05-30 DIAGNOSIS — Z794 Long term (current) use of insulin: Secondary | ICD-10-CM

## 2020-06-02 ENCOUNTER — Ambulatory Visit: Payer: Self-pay | Admitting: *Deleted

## 2020-06-06 ENCOUNTER — Other Ambulatory Visit: Payer: Self-pay | Admitting: Internal Medicine

## 2020-06-06 ENCOUNTER — Encounter: Payer: Self-pay | Admitting: *Deleted

## 2020-06-06 ENCOUNTER — Other Ambulatory Visit: Payer: Self-pay | Admitting: *Deleted

## 2020-06-06 DIAGNOSIS — E118 Type 2 diabetes mellitus with unspecified complications: Secondary | ICD-10-CM

## 2020-06-06 DIAGNOSIS — Z794 Long term (current) use of insulin: Secondary | ICD-10-CM

## 2020-06-06 MED ORDER — ACCU-CHEK GUIDE VI STRP: ORAL_STRIP | 1 refills | Status: DC

## 2020-06-06 NOTE — Patient Outreach (Signed)
[place] [facility]) Care Management  [place]  06/06/2020   Charles Parker 08/01/1946 [id number]  RN Health Coach telephone call to patient.  Hipaa compliance verified. Per patient he has not been checking his blood sugars. Patient stated he had ran out of strips. Patient had not made the attempt to get more. He stated that he had been busy.  Patient stated he has an accu check guide machine and strips listed are Aviva. RN discussed the changes the the Endocrinologist had made in his insulin. Per patient he is taking the insulin as directed. Patient is walking 5 time a week.  Patient A1C has increased from 7.8 to 8.5. RN discussed what he was doing to cause the increase. He has agreed to further outreach calls.   Encounter Medications:  Outpatient Encounter Medications as of 06/06/2020  Medication Sig  . Alcohol Swabs PADS Use to clean area before insulin injections. DX E11.8  . Avanafil (STENDRA) 200 MG TABS Take 1 tablet by mouth every 3 (three) days.  . Blood Glucose Monitoring Suppl (ONETOUCH VERIO FLEX SYSTEM) w/Device KIT 1 each by Does not apply route 3 (three) times daily. Use Onetouch Verio flex to check blood sugar three times daily.  . Cholecalciferol 50 MCG (2000 UT) TABS Take 1 tablet (2,000 Units total) by mouth daily.  Marland Kitchen FARXIGA 10 MG TABS tablet TAKE 1 TABLET BY MOUTH DAILY  . ferrous sulfate 325 (65 FE) MG tablet Take 1 tablet (325 mg total) by mouth 2 (two) times daily with a meal.  . gabapentin (NEURONTIN) 300 MG capsule TAKE 1 CAPSULE(300 MG) BY MOUTH TWICE DAILY  . glucose blood (ACCU-CHEK GUIDE) test strip Use TID  . glucose blood test strip Use Accu Chek Aviva Test Strips as instructed to check blood sugar three times daily.  . insulin aspart protamine - aspart (NOVOLOG MIX 70/30 FLEXPEN) (70-30) 100 UNIT/ML FlexPen Inject 20 units under the skin breakfast and 30 units at dinner.  . Insulin Pen Needle (EASY TOUCH PEN NEEDLES) 31G X 5 MM MISC ADMINISTER  LANTUS ONCE EVERY EVENING  . Multiple Vitamin (MULTIVITAMIN WITH MINERALS) TABS tablet Take 1 tablet by mouth daily.  [name] Lancets 64Q MISC 1 each by Does not apply route 3 (three) times daily. Use Onetouch delica lancets to check blood sugar three times daily.  Marland Kitchen oxybutynin (DITROPAN XL) 15 MG 24 hr tablet Take 1 tablet (15 mg total) by mouth at bedtime.  . rivaroxaban (XARELTO) 10 MG TABS tablet Take 1 tablet (10 mg total) by mouth daily.  . simvastatin (ZOCOR) 40 MG tablet TAKE 1 TABLET(40 MG) BY MOUTH AT BEDTIME  . tamsulosin (FLOMAX) 0.4 MG CAPS capsule TAKE 1 CAPSULE(0.4 MG) BY MOUTH DAILY AFTER BREAKFAST  . TRUEPLUS LANCETS 33G MISC USE TO CHECK BLOOD SUGARS THREE TIMES DAY  . [DISCONTINUED] dapagliflozin propanediol (FARXIGA) 10 MG TABS tablet Take 10 mg by mouth daily.  . [DISCONTINUED] FARXIGA 10 MG TABS tablet TAKE 1 TABLET BY MOUTH DAILY   No facility-administered encounter medications on file as of 06/06/2020.    Functional Status:  In your present state of health, do you have any difficulty performing the following activities: 12/12/2019 09/13/2019  Hearing? N N  Vision? N N  Difficulty concentrating or making decisions? N N  Walking or climbing stairs? Y N  Comment patient uses a cane -  Dressing or bathing? N N  Doing errands, shopping? N N  Preparing Food and eating ? N N  Using the Toilet? N N  In the past six months, have you accidently leaked urine? N N  Do you have problems with loss of bowel control? N N  Managing your Medications? Y N  Comment patient is having difficulty remembering to that 20u in am and 30u after dinner. -  Managing your Finances? N N  Housekeeping or managing your Housekeeping? N N  Some recent data might be hidden    Fall/Depression Screening: Fall Risk  06/06/2020 04/08/2020 12/12/2019  Falls in the past year? 1 1 0  Number falls in past yr: 0 0 0  Comment - - -  Injury with Fall? 0 0 0  Risk Factor Category  - - -  Risk for  fall due to : History of fall(s);Impaired balance/gait;Impaired mobility Impaired balance/gait;Impaired mobility Impaired balance/gait;Impaired mobility  Follow up Falls evaluation completed;Education provided;Falls prevention discussed Falls evaluation completed;Education provided;Falls prevention discussed Falls evaluation completed;Falls prevention discussed  Comment discussed medical alert system also - -   PHQ 2/9 Scores 12/12/2019 06/19/2019 05/24/2019 04/18/2019 03/30/2019 03/29/2019 01/04/2018  PHQ - 2 Score 0 0 0 0 0 0 1  PHQ- 9 Score - - - - - - 3   Goals Addressed              This Visit's Progress   .  Goal of A1C 7or less (pt-stated)        CARE PLAN ENTRY (see longtitudinal plan of care for additional care plan information)  Objective:  Lab Results  Component Value Date   HGBA1C 8.5 (H) 05/12/2020 .   Lab Results  Component Value Date   CREATININE 1.20 05/12/2020   CREATININE 1.20 05/12/2020   CREATININE 1.15 02/04/2020 .   Marland Kitchen No results found for: EGFR  Current Barriers:  Marland Kitchen Knowledge Deficits related to basic Diabetes pathophysiology and self care/management . Knowledge Deficits related to medications used for management of diabetes . Literacy barriers . Behavior modifications  Case Manager Clinical Goal(s):  Over the next 90 days, patient will demonstrate improved adherence to prescribed treatment plan for diabetes self care/management as evidenced by:  Marland Kitchen Verbalize daily monitoring and recording of CBG within 90 days . Verbalize adherence to ADA/ carb modified diet within the next 90 days . Verbalize exercise 5 days/week . Verbalize adherence to prescribed medication regimen within the next 90 days   Interventions:  . Provided education to patient about basic DM disease process . Reviewed medications with patient and discussed importance of medication adherence . Discussed plans with patient for ongoing care management follow up and provided patient with direct  contact information for care management team . Provided patient with written educational materials related to hypo and hyperglycemia and importance of correct treatment . Reviewed scheduled/upcoming provider appointments including: PCP, COVID booster and Endocrinologist . Advised patient, providing education and rationale, to check cbg as per Dr ordered and record, calling Dr Dwyane Dee for findings outside established parameters.    Patient Self Care Activities:  . Self administers oral medications as prescribed . Self administers insulin as prescribed . Attends all scheduled provider appointments . Checks blood sugars as prescribed and utilize hyper and hypoglycemia protocol as needed . Adheres to prescribed ADA/carb modified  Initial goal documentation        Assessment:  A1C has increased to 8.5 from 7.8 Per patient he has changed insurances from [place] to [facility] Patient has not been checking blood sugars as per ordered Patient is taking insulin correctly as per ordered  Plan:  RN discussed monitoring blood sugars RN sent message to PCP for strips RN discussed medication adherence Patient will follow up with flu shot RN will follow up within the month October  [facility] [phone]

## 2020-06-11 ENCOUNTER — Telehealth: Payer: Self-pay | Admitting: Endocrinology

## 2020-06-11 ENCOUNTER — Other Ambulatory Visit: Payer: Self-pay

## 2020-06-11 MED ORDER — NOVOLOG MIX 70/30 FLEXPEN (70-30) 100 UNIT/ML ~~LOC~~ SUPN: PEN_INJECTOR | SUBCUTANEOUS | 2 refills | Status: DC

## 2020-06-11 MED ORDER — INSULIN PEN NEEDLE 31G X 5 MM MISC: 2 refills | Status: DC

## 2020-06-11 NOTE — Telephone Encounter (Signed)
Rx sent 

## 2020-06-11 NOTE — Telephone Encounter (Signed)
Medication Refill Request  Did you call your pharmacy and request this refill first? No-needs RX sent asap . If patient has not contacted pharmacy first, instruct them to do so for future refills.  . Remind them that contacting the pharmacy for their refill is the quickest method to get the refill.  . Refill policy also stated that it will take anywhere between 24-72 hours to receive the refill.    Name of medication?  insulin aspart protamine - aspart (NOVOLOG MIX 70/30 FLEXPEN) (70-30) 100 UNIT/ML FlexPen  AND Ultra fine Pen Needles for the Novolog FlexPen   Is this a 90 day supply? Yes  Name and location of pharmacy?  [facility] [place], [place] [address] Phone:  [phone]  Fax:  [phone]

## 2020-06-22 ENCOUNTER — Other Ambulatory Visit: Payer: Self-pay | Admitting: Internal Medicine

## 2020-06-22 DIAGNOSIS — E785 Hyperlipidemia, unspecified: Secondary | ICD-10-CM

## 2020-07-04 ENCOUNTER — Other Ambulatory Visit (INDEPENDENT_AMBULATORY_CARE_PROVIDER_SITE_OTHER): Payer: Medicare Other

## 2020-07-04 ENCOUNTER — Other Ambulatory Visit: Payer: Self-pay

## 2020-07-04 DIAGNOSIS — E1165 Type 2 diabetes mellitus with hyperglycemia: Secondary | ICD-10-CM

## 2020-07-04 DIAGNOSIS — Z794 Long term (current) use of insulin: Secondary | ICD-10-CM

## 2020-07-04 LAB — BASIC METABOLIC PANEL
BUN: 20 mg/dL (ref 6–23)
CO2: 24 mEq/L (ref 19–32)
Calcium: 9.4 mg/dL (ref 8.4–10.5)
Chloride: 105 mEq/L (ref 96–112)
Creatinine, Ser: 1.11 mg/dL (ref 0.40–1.50)
GFR: 78.28 mL/min (ref >60.00)
Glucose, Bld: 221 mg/dL — ABNORMAL HIGH (ref 70–99)
Potassium: 3.6 mEq/L (ref 3.5–5.1)
Sodium: 139 mEq/L (ref 135–145)

## 2020-07-05 LAB — FRUCTOSAMINE: Fructosamine: 361 umol/L — ABNORMAL HIGH (ref 0–285)

## 2020-07-08 ENCOUNTER — Other Ambulatory Visit: Payer: Self-pay | Admitting: *Deleted

## 2020-07-08 ENCOUNTER — Encounter: Payer: Medicare Other | Admitting: Endocrinology

## 2020-07-08 ENCOUNTER — Other Ambulatory Visit (INDEPENDENT_AMBULATORY_CARE_PROVIDER_SITE_OTHER): Payer: Medicare Other | Admitting: *Deleted

## 2020-07-08 ENCOUNTER — Other Ambulatory Visit: Payer: Self-pay

## 2020-07-08 DIAGNOSIS — Z23 Encounter for immunization: Secondary | ICD-10-CM

## 2020-07-08 NOTE — Patient Outreach (Signed)
[name] [name]) Care Management  [place]  07/08/2020   Charles Parker Sep 27, 1946 [id number]  [place] telephone call to patient.  Hipaa compliance verified. Per patient he had run out of strips. RN notified Dr [name] and he sent in for strips over a month ago. Patient was notified then. Now patient said he didn't have the extra money to pick them up. Patient stated he was out of needles for the novolog pen. Per patient it had been over a month. He stated he has not received his insurance card yet.  He did not notify anyone of the above. Patient has not been checking his blood sugars or taken his insulin in over a month. RN followed up with [facility] and pharmacy to put insurance on file at the pharmacy. Patient stated he has an appointment today with Dr Dwyane Dee. Patient is to make the physician aware. Patient has agreed to follow up outreach calls. Encounter Medications:  Outpatient Encounter Medications as of 07/08/2020  Medication Sig  . Alcohol Swabs PADS Use to clean area before insulin injections. DX E11.8  . Avanafil (STENDRA) 200 MG TABS Take 1 tablet by mouth every 3 (three) days.  . Blood Glucose Monitoring Suppl (ONETOUCH VERIO FLEX SYSTEM) w/Device KIT 1 each by Does not apply route 3 (three) times daily. Use Onetouch Verio flex to check blood sugar three times daily.  . Cholecalciferol 50 MCG (2000 UT) TABS Take 1 tablet (2,000 Units total) by mouth daily.  Marland Kitchen FARXIGA 10 MG TABS tablet TAKE 1 TABLET BY MOUTH DAILY  . ferrous sulfate 325 (65 FE) MG tablet Take 1 tablet (325 mg total) by mouth 2 (two) times daily with a meal.  . gabapentin (NEURONTIN) 300 MG capsule TAKE 1 CAPSULE(300 MG) BY MOUTH TWICE DAILY  . glucose blood (ACCU-CHEK GUIDE) test strip Use TID  . glucose blood test strip Use Accu Chek Aviva Test Strips as instructed to check blood sugar three times daily.  . insulin aspart protamine - aspart (NOVOLOG MIX 70/30 FLEXPEN) (70-30) 100 UNIT/ML  FlexPen Inject 26 units under the skin breakfast and at dinner.  . Insulin Pen Needle 31G X 5 MM MISC Use pen needles to inject insulin twice daily.  . Multiple Vitamin (MULTIVITAMIN WITH MINERALS) TABS tablet Take 1 tablet by mouth daily.  [name] Lancets 20B MISC 1 each by Does not apply route 3 (three) times daily. Use Onetouch delica lancets to check blood sugar three times daily.  Marland Kitchen oxybutynin (DITROPAN XL) 15 MG 24 hr tablet Take 1 tablet (15 mg total) by mouth at bedtime.  . rivaroxaban (XARELTO) 10 MG TABS tablet Take 1 tablet (10 mg total) by mouth daily.  . simvastatin (ZOCOR) 40 MG tablet TAKE 1 TABLET(40 MG) BY MOUTH AT BEDTIME  . tamsulosin (FLOMAX) 0.4 MG CAPS capsule TAKE 1 CAPSULE(0.4 MG) BY MOUTH DAILY AFTER BREAKFAST  . TRUEPLUS LANCETS 33G MISC USE TO CHECK BLOOD SUGARS THREE TIMES DAY  . [DISCONTINUED] dapagliflozin propanediol (FARXIGA) 10 MG TABS tablet Take 10 mg by mouth daily.  . [DISCONTINUED] FARXIGA 10 MG TABS tablet TAKE 1 TABLET BY MOUTH DAILY  . [DISCONTINUED] simvastatin (ZOCOR) 40 MG tablet TAKE 1 TABLET(40 MG) BY MOUTH AT BEDTIME   No facility-administered encounter medications on file as of 07/08/2020.    Functional Status:  In your present state of health, do you have any difficulty performing the following activities: 12/12/2019 09/13/2019  Hearing? N N  Vision? N N  Difficulty concentrating or making decisions? N N  Walking or climbing stairs? Y N  Comment patient uses a cane -  Dressing or bathing? N N  Doing errands, shopping? N N  Preparing Food and eating ? N N  Using the Toilet? N N  In the past six months, have you accidently leaked urine? N N  Do you have problems with loss of bowel control? N N  Managing your Medications? Y N  Comment patient is having difficulty remembering to that 20u in am and 30u after dinner. -  Managing your Finances? N N  Housekeeping or managing your Housekeeping? N N  Some recent data might be hidden     Fall/Depression Screening: Fall Risk  07/08/2020 06/06/2020 04/08/2020  Falls in the past year? _0 Number falls in past yr: 0 0 0  Comment - - -  Injury with Fall? 0 0 0  Risk Factor Category  - - -  Risk for fall due to : History of fall(s);Impaired balance/gait;Impaired mobility History of fall(s);Impaired balance/gait;Impaired mobility Impaired balance/gait;Impaired mobility  Follow up Falls evaluation completed Falls evaluation completed;Education provided;Falls prevention discussed Falls evaluation completed;Education provided;Falls prevention discussed  Comment - discussed medical alert system also -   PHQ 2/9 Scores 12/12/2019 06/19/2019 05/24/2019 04/18/2019 03/30/2019 03/29/2019 01/04/2018  PHQ - 2 Score 0 0 0 0 0 0 1  PHQ- 9 Score - - - - - - 3    Assessment:  Goals Addressed            This Visit's Progress   . Exercise daily       I want to improve my diet by decreasing sugar and carbohydrates. Continue to walk daily. Enjoy life and family    . Make and Keep All Appointments       Follow Up Date [id number]   - call to cancel if needed - keep a calendar with prescription refill dates - keep a calendar with appointment dates    Why is this important?   Part of staying healthy is seeing the doctor for follow-up care.  If you forget your appointments, there are some things you can do to stay on track.    Notes:     Marland Kitchen Manage My Medicine       Follow Up Date [id number]   - call for medicine refill 2 or 3 days before it runs out - call if I am sick and can't take my medicine - keep a list of all the medicines I take; vitamins and herbals too - use a pillbox to sort medicine    Why is this important?   These steps will help you keep on track with your medicines.    Notes:     Marland Kitchen Monitor and Manage My Blood Sugar   Not on track    Follow Up Date [id number]   - check blood sugar at prescribed times - check blood sugar if I feel it is too high or too low - enter blood  sugar readings and medication or insulin into daily log - take the blood sugar log to all doctor visits - take the blood sugar meter to all doctor visits    Why is this important?   Checking your blood sugar at home helps to keep it from getting very high or very low.  Writing the results in a diary or log helps the doctor know how to care for you.  Your blood sugar log should  have the time, date and the results.  Also, write down the amount of insulin or other medicine that you take.  Other information, like what you ate, exercise done and how you were feeling, will also be helpful.     Notes:  Patient ran out of strip. Prescription sent in. Patient stated he was unable to afford and did not make anyone aware. Have not checked blood sugars in over a month    . Obtain Eye Exam   On track    Follow Up Date [id number] - keep appointment with eye doctor - schedule appointment with eye doctor    Why is this important?   Eye check-ups are important when you have diabetes.  Vision loss can be prevented.    Notes:  Last eye exam 11/12/2019    . Perform Foot Care   On track    Follow Up Date 10/03/2020   - check feet daily for cuts, sores or redness - trim toenails straight across - wash and dry feet carefully every day - wear comfortable, cotton socks - wear comfortable, well-fitting shoes    Why is this important?   Good foot care is very important when you have diabetes.  There are many things you can do to keep your feet healthy and catch a problem early.    Notes:  Foot care 04/25/2020 measured for shoes and toenails cut       Plan:  RN Health Coach contacted [facility] for insurance information to be sent to [place] stayed on line until information received by pharmacy Patient is to pick up prescriptions after Dr Dwyane Dee visit Patient is to make Dr Dwyane Dee aware of the above situation Patient is to contact [place] if any problems RN Health Coach will follow up within  2 weeks  RN sent assessment to PCP  [place] Management [phone]   Patient will see Dr Dwyane Dee today

## 2020-07-08 NOTE — Progress Notes (Signed)
This encounter was created in error - please disregard.

## 2020-07-09 ENCOUNTER — Encounter: Payer: Self-pay | Admitting: Podiatry

## 2020-07-09 ENCOUNTER — Ambulatory Visit: Payer: Medicare HMO | Admitting: Podiatry

## 2020-07-09 DIAGNOSIS — M2012 Hallux valgus (acquired), left foot: Secondary | ICD-10-CM | POA: Diagnosis not present

## 2020-07-09 DIAGNOSIS — M2041 Other hammer toe(s) (acquired), right foot: Secondary | ICD-10-CM | POA: Diagnosis not present

## 2020-07-09 DIAGNOSIS — M2011 Hallux valgus (acquired), right foot: Secondary | ICD-10-CM

## 2020-07-09 DIAGNOSIS — B351 Tinea unguium: Secondary | ICD-10-CM | POA: Diagnosis not present

## 2020-07-09 DIAGNOSIS — E1151 Type 2 diabetes mellitus with diabetic peripheral angiopathy without gangrene: Secondary | ICD-10-CM | POA: Diagnosis not present

## 2020-07-09 DIAGNOSIS — M205X2 Other deformities of toe(s) (acquired), left foot: Secondary | ICD-10-CM

## 2020-07-09 DIAGNOSIS — M2042 Other hammer toe(s) (acquired), left foot: Secondary | ICD-10-CM

## 2020-07-09 LAB — HM DIABETES FOOT EXAM

## 2020-07-09 NOTE — Progress Notes (Signed)
This patient returns to my office for at risk foot care.  This patient requires this care by a professional since this patient will be at risk due to having diabetes type 2 and coagulation defect.  Patient is taking xarelto.  This patient is unable to cut nails himself since the patient cannot reach his nails.These nails are painful walking and wearing shoes.  This patient presents for at risk foot care today.  General Appearance  Alert, conversant and in no acute stress.  Vascular  Dorsalis pedis and posterior tibial  pulses are weakly palpable  bilaterally.  Capillary return is within normal limits  bilaterally. Temperature is within normal limits  bilaterally.  Neurologic  Senn-Weinstein monofilament wire test within normal limits  bilaterally. Muscle power within normal limits bilaterally.  Nails Thick disfigured discolored nails with subungual debris  from hallux to fifth toes bilaterally.  Orthopedic  No limitations of motion  feet .  No crepitus or effusions noted.  HAV with overlapping second digit  B/L.  Skin  normotropic skin with no porokeratosis noted bilaterally.  No signs of infections or ulcers noted.     Onychomycosis  Pain in right toes  Pain in left toes  Consent was obtained for treatment procedures.   Mechanical debridement of nails 1-5  bilaterally performed with a nail nipper.  Filed with dremel without incident.  Patient qualifies for diabetic shoes due to diabetes ,HAV and hammer toes  B/L. Patient has not seen his medical doctor so his shoes were cancelled.   Return office visit     3 months                Told patient to return for periodic foot care and evaluation due to potential at risk complications.   Gardiner Barefoot DPM

## 2020-07-11 ENCOUNTER — Ambulatory Visit (INDEPENDENT_AMBULATORY_CARE_PROVIDER_SITE_OTHER): Payer: Medicare Other | Admitting: Endocrinology

## 2020-07-11 ENCOUNTER — Encounter: Payer: Self-pay | Admitting: Endocrinology

## 2020-07-11 ENCOUNTER — Other Ambulatory Visit: Payer: Self-pay

## 2020-07-11 ENCOUNTER — Other Ambulatory Visit: Payer: Self-pay | Admitting: Endocrinology

## 2020-07-11 VITALS — BP 138/68 | HR 90 | Wt 197.0 lb

## 2020-07-11 DIAGNOSIS — E7800 Pure hypercholesterolemia, unspecified: Secondary | ICD-10-CM

## 2020-07-11 DIAGNOSIS — E1165 Type 2 diabetes mellitus with hyperglycemia: Secondary | ICD-10-CM

## 2020-07-11 DIAGNOSIS — Z794 Long term (current) use of insulin: Secondary | ICD-10-CM | POA: Diagnosis not present

## 2020-07-11 NOTE — Progress Notes (Signed)
Patient ID: Charles Parker, male   DOB: December 20, 1945, 74 y.o.   MRN: [id number]          Reason for Appointment: Follow-up for Type 2 Diabetes  Referring physician: Scarlette Calico   History of Present Illness:          Date of diagnosis of type 2 diabetes mellitus: 2014        Background history:   He has been on various oral hypoglycemic drugs since the onset including metformin, Januvia and Amaryl Was also on Invokana in 2015 for about a year but not clear why this was stopped His level of control has been quite variable with highest A1c 13.6 in 06/2015  He has been on insulin since about 10/2014 as documented in his record  Recent history:   INSULIN regimen is:  NovoLog mix 26 units before breakfast and 32 units before dinner  Non-insulin hypoglycemic drugs the patient is taking NBV:APOLIDC 10 mg daily  His A1c is back up to 8.5 compared to 7.8    Current management, blood sugar patterns and problems identified:  He again is not checking blood sugars stating that his prescription was not refilled by the pharmacy  He did start checking his blood sugars 3 days ago  However he says the pen needles were not fitting his insulin pens and he has not taken insulin for the last 3 days  This is causing his blood sugars to be as high as 100+ yesterday  However his blood sugars was previously still high in the lab in the early afternoon at 221  This is despite increasing his morning insulin to 26 units  He has some difficulty remembering how much insulin he is taking but appears to be taking what was prescribed previously  Generally tries to take insulin before starting to eat at breakfast and dinnertime  He appears to have lost a little weight but not clear why   Side effects from medications have been: Hives from metformin  Compliance with the medical regimen: Inconsistent  Glucose monitoring:  done< 1 times a   day usually        Glucometer: Accu-Chek guide      Blood  Glucose readings for the last 3 days  174, 219 in the morning and 433 last evening    Self-care: The diet that the patient has been following is: tries to limit [employer] .      Typical meal intake: Breakfast is eggs/cereal or oatmeal usually, lunch:  Lean cuisine or sandwiches.  Dinner chicken and vegetables.   Snacks will be chips, peanut butter crackers Supper at 6 pm                Dietician visit, most recent:3/19               Exercise: walking for-5 laps around parking lot  Weight history:  Wt Readings from Last 3 Encounters:  07/11/20 197 lb (89.4 kg)  05/27/20 204 lb 3.2 oz (92.6 kg)  02/08/20 204 lb 6.4 oz (92.7 kg)    Glycemic control:   Lab Results  Component Value Date   HGBA1C 8.5 (H) 05/12/2020   HGBA1C 7.8 (H) 02/04/2020   HGBA1C 11.5 (H) 09/24/2019   Lab Results  Component Value Date   MICROALBUR <0.7 02/04/2020   LDLCALC 124 (H) 05/12/2020   CREATININE 1.11 07/04/2020   Lab Results  Component Value Date   MICRALBCREAT 0.8 02/04/2020    Lab Results  Component  Value Date   FRUCTOSAMINE 361 (H) 07/04/2020   FRUCTOSAMINE 337 (H) 04/30/2019   FRUCTOSAMINE 401 (H) 03/05/2019      Allergies as of 07/11/2020      Reactions   Metformin And Related Diarrhea   Codeine Rash   All over the body      Medication List       Accurate as of July 11, 2020  4:01 PM. If you have any questions, ask your nurse or doctor.        Alcohol Swabs Pads Use to clean area before insulin injections. DX E11.8   Cholecalciferol 50 MCG (2000 UT) Tabs Take 1 tablet (2,000 Units total) by mouth daily.   Farxiga 10 MG Tabs tablet Generic drug: dapagliflozin propanediol TAKE 1 TABLET BY MOUTH DAILY   ferrous sulfate 325 (65 FE) MG tablet Take 1 tablet (325 mg total) by mouth 2 (two) times daily with a meal.   gabapentin 300 MG capsule Commonly known as: NEURONTIN TAKE 1 CAPSULE(300 MG) BY MOUTH TWICE DAILY   glucose blood test strip Use Accu Chek  Aviva Test Strips as instructed to check blood sugar three times daily.   Accu-Chek Guide test strip Generic drug: glucose blood Use TID   Insulin Pen Needle 31G X 5 MM Misc Use pen needles to inject insulin twice daily.   multivitamin with minerals Tabs tablet Take 1 tablet by mouth daily.   NovoLOG Mix 70/30 FlexPen (70-30) 100 UNIT/ML FlexPen Generic drug: insulin aspart protamine - aspart Inject 26 units under the skin breakfast and at dinner.   OneTouch Verio Flex System w/Device Kit 1 each by Does not apply route 3 (three) times daily. Use Onetouch Verio flex to check blood sugar three times daily.   oxybutynin 15 MG 24 hr tablet Commonly known as: DITROPAN XL Take 1 tablet (15 mg total) by mouth at bedtime.   rivaroxaban 10 MG Tabs tablet Commonly known as: Xarelto Take 1 tablet (10 mg total) by mouth daily.   simvastatin 40 MG tablet Commonly known as: ZOCOR TAKE 1 TABLET(40 MG) BY MOUTH AT BEDTIME   Stendra 200 MG Tabs Generic drug: Avanafil Take 1 tablet by mouth every 3 (three) days.   tamsulosin 0.4 MG Caps capsule Commonly known as: FLOMAX TAKE 1 CAPSULE(0.4 MG) BY MOUTH DAILY AFTER BREAKFAST   TRUEplus Lancets 33G Misc USE TO CHECK BLOOD SUGARS THREE TIMES DAY   OneTouch Delica Lancets 15Q Misc 1 each by Does not apply route 3 (three) times daily. Use Onetouch delica lancets to check blood sugar three times daily.       Allergies:  Allergies  Allergen Reactions  . Metformin And Related Diarrhea  . Codeine Rash    All over the body    Past Medical History:  Diagnosis Date  . Clotting disorder (HCC)    DVT  . Diabetes mellitus    type II  . Diverticulosis   . DVT (deep venous thrombosis) ([place])   . Glaucoma    no eye drops   . Hepatic cyst   . Hypercholesterolemia   . Hypertension   . Renal cyst   . Sleep apnea   . Tubulovillous adenoma     Past Surgical History:  Procedure Laterality Date  . BLADDER NECK RECONSTRUCTION  02/03/2012     Procedure: BLADDER NECK REPAIR;  Surgeon: Adin Hector, MD;  Location: [facility];  Service: General;;  . COLONOSCOPY  02/2013   polyp removal(mult.)  . KNEE SURGERY  2004   left  . POLYPECTOMY    . PROCTOSCOPY  02/03/2012   Procedure: PROCTOSCOPY;  Surgeon: Adin Hector, MD;  Location: [facility];  Service: General;;  . STOMACH SURGERY  02/2012  . TEE WITHOUT CARDIOVERSION N/A 10/05/2016   Procedure: TRANSESOPHAGEAL ECHOCARDIOGRAM (TEE);  Surgeon: Sanda Klein, MD;  Location: [facility] ENDOSCOPY;  Service: Cardiovascular;  Laterality: N/A;    Family History  Problem Relation Age of Onset  . Hypertension Mother   . Diabetes Mother   . Cancer Mother        ? location  . Colon cancer Father 62  . Colon polyps Neg Hx   . Liver cancer Neg Hx     Social History:  reports that he has never smoked. He has never used smokeless tobacco. He reports that he does not drink alcohol and does not use drugs.   Review of Systems   Lipid history: Has been taking Zocor 40 mg with variable control, followed by PCP Likely may have issues with compliance    Lab Results  Component Value Date   CHOL 202 (H) 05/12/2020   CHOL 195 03/05/2019   CHOL 165 03/03/2018   Lab Results  Component Value Date   HDL 50.80 05/12/2020   HDL 46.40 03/05/2019   HDL 39.20 03/03/2018   Lab Results  Component Value Date   LDLCALC 124 (H) 05/12/2020   LDLCALC 119 (H) 03/05/2019   LDLCALC 91 03/03/2018   Lab Results  Component Value Date   TRIG 134.0 05/12/2020   TRIG 152.0 (H) 03/05/2019   TRIG 172.0 (H) 03/03/2018   Lab Results  Component Value Date   CHOLHDL 4 05/12/2020   CHOLHDL 4 03/05/2019   CHOLHDL 4 03/03/2018   Lab Results  Component Value Date   LDLDIRECT 115.0 07/02/2019   LDLDIRECT 118.0 11/11/2015   LDLDIRECT 201.0 03/04/2015           He has history of neuropathy taking gabapentin prescribed by PCP  Most recent foot exam: 03/2019  RENAL function is consistently normal with using  Farxiga 10 mg  Lab Results  Component Value Date   CREATININE 1.11 07/04/2020   CREATININE 1.20 05/12/2020   CREATININE 1.20 05/12/2020    His blood pressure is near normal, not on any treatment  BP Readings from Last 3 Encounters:  07/11/20 138/68  05/27/20 136/74  02/08/20 134/70    Has had Covid shots   Physical Examination:  BP 138/68   Pulse 90   Wt 197 lb (89.4 kg)   SpO2 94%   BMI 29.52 kg/m       ASSESSMENT:  Diabetes type 2 on insulin  See history of present illness for detailed discussion of current diabetes management, blood sugar patterns and problems identified   He is on a regimen of premixed insulin twice a day and Farxiga  His A1c is last 8.5 Fructosamine is 361  Although he has just started checking his blood sugars again at home he has not taken insulin for 3 days and difficult to know what his blood sugar patterns are when he is regularly taking insulin Blood sugar was high in the early afternoon in the lab Since fructosamine is high likely needs a higher dose of insulin in the morning at least  Blood pressure: Controlled with Farxiga only   LIPIDS: To have lipids rechecked again to make sure he is compliant with his statin drug  PLAN:   Increase morning insulin to 32 also Follow-up  in 6 weeks with home glucose monitoring results for assessment of his insulin dosing However told him to make sure he calls if his blood sugars are getting low Also to make sure he does not delay his lunch or at least have a snack midday  Patient Instructions  Take 32 units both at breakfast and supper  Eat lunch on time  Check blood sugars on waking up 3 days a week  Also check blood sugars about 2 hours after meals and do this after different meals by rotation  Recommended blood sugar levels on waking up are 90-130 and about 2 hours after meal is 130-180  Please bring your blood sugar monitor to each visit, thank you        Elayne Snare 07/11/2020, 4:01 PM   Note: This office note was prepared with Dragon voice recognition system technology. Any transcriptional errors that result from this process are unintentional.

## 2020-07-11 NOTE — Patient Instructions (Addendum)
Take 32 units both at breakfast and supper  Eat lunch on time  Check blood sugars on waking up 3 days a week  Also check blood sugars about 2 hours after meals and do this after different meals by rotation  Recommended blood sugar levels on waking up are 90-130 and about 2 hours after meal is 130-180  Please bring your blood sugar monitor to each visit, thank you

## 2020-07-16 ENCOUNTER — Other Ambulatory Visit: Payer: Self-pay | Admitting: Endocrinology

## 2020-07-18 ENCOUNTER — Other Ambulatory Visit: Payer: Self-pay | Admitting: *Deleted

## 2020-07-18 NOTE — Patient Outreach (Signed)
[facility]) Care Management  07/18/2020  Charles Parker 09/12/1946 [id number]  [place] telephone call to patient.  [employer] compliance verified. RN made sure that patient had received all his supplies to check his blood sugars. Patient has received his strips and needles. Per patient he picked everything up yesterday and will take his bath and check before breakfast.  Plan: Discussed healthy eating Follow up outreach within the month of November Patient will monitor blood sugars as per Dr order Patient will take medications as prescribed  [place] [facility] [phone]

## 2020-07-19 ENCOUNTER — Ambulatory Visit: Payer: Medicare Other

## 2020-08-19 ENCOUNTER — Other Ambulatory Visit: Payer: Self-pay

## 2020-08-19 ENCOUNTER — Other Ambulatory Visit (INDEPENDENT_AMBULATORY_CARE_PROVIDER_SITE_OTHER): Payer: Medicare Other

## 2020-08-19 DIAGNOSIS — E7800 Pure hypercholesterolemia, unspecified: Secondary | ICD-10-CM

## 2020-08-19 DIAGNOSIS — E1165 Type 2 diabetes mellitus with hyperglycemia: Secondary | ICD-10-CM | POA: Diagnosis not present

## 2020-08-19 DIAGNOSIS — Z794 Long term (current) use of insulin: Secondary | ICD-10-CM

## 2020-08-19 LAB — COMPREHENSIVE METABOLIC PANEL
ALT: 18 U/L (ref 0–53)
AST: 18 U/L (ref 0–37)
Albumin: 4.1 g/dL (ref 3.5–5.2)
Alkaline Phosphatase: 58 U/L (ref 39–117)
BUN: 16 mg/dL (ref 6–23)
CO2: 26 mEq/L (ref 19–32)
Calcium: 9.1 mg/dL (ref 8.4–10.5)
Chloride: 103 mEq/L (ref 96–112)
Creatinine, Ser: 1.18 mg/dL (ref 0.40–1.50)
GFR: 60.8 mL/min (ref >60.00)
Glucose, Bld: 266 mg/dL — ABNORMAL HIGH (ref 70–99)
Potassium: 3.8 mEq/L (ref 3.5–5.1)
Sodium: 137 mEq/L (ref 135–145)
Total Bilirubin: 0.7 mg/dL (ref 0.2–1.2)
Total Protein: 7 g/dL (ref 6.0–8.3)

## 2020-08-19 LAB — LIPID PANEL
Cholesterol: 153 mg/dL (ref 0–200)
HDL: 43 mg/dL (ref >39.00)
LDL Cholesterol: 84 mg/dL (ref 0–99)
NonHDL: 109.65
Total CHOL/HDL Ratio: 4
Triglycerides: 130 mg/dL (ref 0.0–149.0)
VLDL: 26 mg/dL (ref 0.0–40.0)

## 2020-08-19 LAB — HEMOGLOBIN A1C: Hgb A1c MFr Bld: 9.6 % — ABNORMAL HIGH (ref 4.6–6.5)

## 2020-08-22 ENCOUNTER — Other Ambulatory Visit: Payer: Self-pay

## 2020-08-22 ENCOUNTER — Ambulatory Visit (INDEPENDENT_AMBULATORY_CARE_PROVIDER_SITE_OTHER): Payer: Medicare Other | Admitting: Endocrinology

## 2020-08-22 VITALS — BP 122/82 | HR 65 | Ht 70.0 in | Wt 202.6 lb

## 2020-08-22 DIAGNOSIS — E7800 Pure hypercholesterolemia, unspecified: Secondary | ICD-10-CM

## 2020-08-22 DIAGNOSIS — E1165 Type 2 diabetes mellitus with hyperglycemia: Secondary | ICD-10-CM | POA: Diagnosis not present

## 2020-08-22 DIAGNOSIS — E1140 Type 2 diabetes mellitus with diabetic neuropathy, unspecified: Secondary | ICD-10-CM

## 2020-08-22 DIAGNOSIS — Z794 Long term (current) use of insulin: Secondary | ICD-10-CM | POA: Diagnosis not present

## 2020-08-22 NOTE — Progress Notes (Signed)
There is only 5:00 and looks like per the patient ID: Charles Parker, male   DOB: September 04, 1946, 74 y.o.   MRN: [id number]          Reason for Appointment: Follow-up for Type 2 Diabetes  Referring physician: Scarlette Calico   History of Present Illness:          Date of diagnosis of type 2 diabetes mellitus: 2014        Background history:   He has been on various oral hypoglycemic drugs since the onset including metformin, Januvia and Amaryl Was also on Invokana in 2015 for about a year but not clear why this was stopped His level of control has been quite variable with highest A1c 13.6 in 06/2015  He has been on insulin since about 10/2014 as documented in his record  Recent history:   INSULIN regimen is:  NovoLog mix 30 units before breakfast and 20 units before dinner  Non-insulin hypoglycemic drugs the patient is taking ZDG:LOVFIEP 10 mg daily  His A1c is back up to 9.6   Current management, blood sugar patterns and problems identified:  He has checked his blood sugars more regularly since his last visit but except for a few readings late morning they all appear to be significantly higher  He was supposed to take 32 units twice a day of his insulin but is only taking 20 in the evening  Also has only a few readings in the last few days and blood sugars are not consistently high in the morning, however lowest blood sugar is only 150  Blood sugars are usually averaging over 200 the rest of the day  He thinks he is taking his insulin regularly  Also lab glucose at 9 AM was 266  Occasionally may have an insulin coming out of the skin after his injection and does not hold the pen down for 5 seconds after fully injecting it  He thinks he is taking his [place] every morning  Appears to have relatively high fat diet such as sausages  His weight is about the same as in August   Side effects from medications have been: Hives from metformin  Compliance with the medical  regimen: Inconsistent  Glucose monitoring:  done< 1 times a   day usually        Glucometer: Accu-Chek guide      Blood Glucose readings and averages from meter download   PRE-MEAL  morning Lunch Dinner Bedtime Overall  Glucose range:  150-327  276, 191  176-309  157  150-327  Mean/median:  185   245   208   POST-MEAL PC Breakfast PC Lunch PC Dinner  Glucose range:  ?   Mean/median:       Self-care: The diet that the patient has been following is: tries to limit [employer] .      Typical meal intake: Breakfast is eggs/cereal or oatmeal usually, lunch:  Lean cuisine or sandwiches.  Dinner chicken and vegetables.   Snacks will be chips, peanut butter crackers Supper at 6 pm                Dietician visit, most recent:3/19               Exercise: walking for-5 laps around parking lot  Weight history:  Wt Readings from Last 3 Encounters:  08/22/20 202 lb 9.6 oz (91.9 kg)  07/11/20 197 lb (89.4 kg)  05/27/20 204 lb 3.2 oz (92.6 kg)  Glycemic control:   Lab Results  Component Value Date   HGBA1C 9.6 (H) 08/19/2020   HGBA1C 8.5 (H) 05/12/2020   HGBA1C 7.8 (H) 02/04/2020   Lab Results  Component Value Date   MICROALBUR <0.7 02/04/2020   LDLCALC 84 08/19/2020   CREATININE 1.18 08/19/2020   Lab Results  Component Value Date   MICRALBCREAT 0.8 02/04/2020    Lab Results  Component Value Date   FRUCTOSAMINE 361 (H) 07/04/2020   FRUCTOSAMINE 337 (H) 04/30/2019   FRUCTOSAMINE 401 (H) 03/05/2019      Allergies as of 08/22/2020      Reactions   Metformin And Related Diarrhea   Codeine Rash   All over the body      Medication List       Accurate as of August 22, 2020 11:59 PM. If you have any questions, ask your nurse or doctor.        Alcohol Swabs Pads Use to clean area before insulin injections. DX E11.8   B-D UF III MINI PEN NEEDLES 31G X 5 MM Misc Generic drug: Insulin Pen Needle ADMINISTER 30 UNITS EVERY EVENING   Cholecalciferol 50 MCG (2000  UT) Tabs Take 1 tablet (2,000 Units total) by mouth daily.   Farxiga 10 MG Tabs tablet Generic drug: dapagliflozin propanediol TAKE 1 TABLET BY MOUTH DAILY   ferrous sulfate 325 (65 FE) MG tablet Take 1 tablet (325 mg total) by mouth 2 (two) times daily with a meal.   gabapentin 300 MG capsule Commonly known as: NEURONTIN TAKE 1 CAPSULE(300 MG) BY MOUTH TWICE DAILY   glucose blood test strip Use Accu Chek Aviva Test Strips as instructed to check blood sugar three times daily.   Accu-Chek Guide test strip Generic drug: glucose blood Use TID   multivitamin with minerals Tabs tablet Take 1 tablet by mouth daily.   NovoLOG Mix 70/30 FlexPen (70-30) 100 UNIT/ML FlexPen Generic drug: insulin aspart protamine - aspart Inject 26 units under the skin breakfast and at dinner.   OneTouch Verio Flex System w/Device Kit 1 each by Does not apply route 3 (three) times daily. Use Onetouch Verio flex to check blood sugar three times daily.   oxybutynin 15 MG 24 hr tablet Commonly known as: DITROPAN XL Take 1 tablet (15 mg total) by mouth at bedtime.   rivaroxaban 10 MG Tabs tablet Commonly known as: Xarelto Take 1 tablet (10 mg total) by mouth daily.   simvastatin 40 MG tablet Commonly known as: ZOCOR TAKE 1 TABLET(40 MG) BY MOUTH AT BEDTIME   Stendra 200 MG Tabs Generic drug: Avanafil Take 1 tablet by mouth every 3 (three) days.   tamsulosin 0.4 MG Caps capsule Commonly known as: FLOMAX TAKE 1 CAPSULE(0.4 MG) BY MOUTH DAILY AFTER BREAKFAST   TRUEplus Lancets 33G Misc USE TO CHECK BLOOD SUGARS THREE TIMES DAY   OneTouch Delica Lancets 40J Misc 1 each by Does not apply route 3 (three) times daily. Use Onetouch delica lancets to check blood sugar three times daily.       Allergies:  Allergies  Allergen Reactions  . Metformin And Related Diarrhea  . Codeine Rash    All over the body    Past Medical History:  Diagnosis Date  . Clotting disorder (HCC)    DVT  .  Diabetes mellitus    type II  . Diverticulosis   . DVT (deep venous thrombosis) ([place])   . Glaucoma    no eye drops   . Hepatic cyst   .  Hypercholesterolemia   . Hypertension   . Renal cyst   . Sleep apnea   . Tubulovillous adenoma     Past Surgical History:  Procedure Laterality Date  . BLADDER NECK RECONSTRUCTION  02/03/2012   Procedure: BLADDER NECK REPAIR;  Surgeon: Adin Hector, MD;  Location: [facility];  Service: General;;  . COLONOSCOPY  02/2013   polyp removal(mult.)  . KNEE SURGERY  2004   left  . POLYPECTOMY    . PROCTOSCOPY  02/03/2012   Procedure: PROCTOSCOPY;  Surgeon: Adin Hector, MD;  Location: [facility]S;  Service: General;;  . STOMACH SURGERY  02/2012  . TEE WITHOUT CARDIOVERSION N/A 10/05/2016   Procedure: TRANSESOPHAGEAL ECHOCARDIOGRAM (TEE);  Surgeon: Sanda Klein, MD;  Location[facility] ENDOSCOPY;  Service: Cardiovascular;  Laterality: N/A;    Family History  Problem Relation Age of Onset  . Hypertension Mother   . Diabetes Mother   . Cancer Mother        ? location  . Colon cancer Father 46  . Colon polyps Neg Hx   . Liver cancer Neg Hx     Social History:  reports that he has never smoked. He has never used smokeless tobacco. He reports that he does not drink alcohol and does not use drugs.   Review of Systems   Lipid history: Has been taking Zocor 40 mg with variable control, followed by PCP    Lab Results  Component Value Date   CHOL 153 08/19/2020   CHOL 202 (H) 05/12/2020   CHOL 195 03/05/2019   Lab Results  Component Value Date   HDL 43.00 08/19/2020   HDL 50.80 05/12/2020   HDL 46.40 03/05/2019   Lab Results  Component Value Date   LDLCALC 84 08/19/2020   LDLCALC 124 (H) 05/12/2020   LDLCALC 119 (H) 03/05/2019   Lab Results  Component Value Date   TRIG 130.0 08/19/2020   TRIG 134.0 05/12/2020   TRIG 152.0 (H) 03/05/2019   Lab Results  Component Value Date   CHOLHDL 4 08/19/2020   CHOLHDL 4 05/12/2020   CHOLHDL 4 03/05/2019    Lab Results  Component Value Date   LDLDIRECT 115.0 07/02/2019   LDLDIRECT 118.0 11/11/2015   LDLDIRECT 201.0 03/04/2015           He has history of neuropathy taking gabapentin prescribed by PCP  Most recent foot exam: 03/2019  RENAL function is consistently normal with using Farxiga 10 mg  Lab Results  Component Value Date   CREATININE 1.18 08/19/2020   CREATININE 1.11 07/04/2020   CREATININE 1.20 05/12/2020   CREATININE 1.20 05/12/2020    Blood pressure fairly good without treatment  BP Readings from Last 3 Encounters:  08/22/20 122/82  07/11/20 138/68  05/27/20 136/74    Has had all 3 Covid shots   Physical Examination:  BP 122/82   Pulse 65   Ht '5\' 10"'  (1.778 m)   Wt 202 lb 9.6 oz (91.9 kg)   SpO2 93%   BMI 29.07 kg/m       ASSESSMENT:  Diabetes type 2 on insulin  See history of present illness for detailed discussion of current diabetes management, blood sugar patterns and problems identified   He is on a regimen of premixed insulin twice a day and Farxiga  His A1c is is now 9.6  Difficult to certain whether he has difficulties with compliance with his insulin, diet or other factors causing his blood sugar to be progressively higher In  the past has had difficulty getting his prescriptions for various supplies or medications on time Also has not followed his directions for insulin dose, recommended 32 units on the last visit Currently with limited amount of monitoring appears that his blood sugars are generally higher in the midday and afternoon but not clear if they are consistently high after dinner Today discussed also injection technique which may not be proper   Blood pressure: Controlled with Farxiga only and no change in renal function  LIPIDS: He is likely to be taking his simvastatin regularly as LDL is now finally below 100    PLAN:   Increase morning insulin since his afternoon and evening readings are mostly higher and he will  now take 36 units Also go up from 20 up to 26 units in the evening at dinnertime Make sure he takes his insulin before starting to eat at least 10-15 minutes before He will make sure he is holding his needle in the skin 5 seconds after he finishes the insulin delivery  Blood sugar testing at different times of the day Consultation with dietitian since likely will have better control with lower fat diet  Stay on 40 mg simvastatin  Patient Instructions  INSULIN 36 UNITS IN AM AND 26  AT DINNER  Check blood sugars on waking up 3-4  days a week  Also check blood sugars about 2 hours after meals and do this after different meals by rotation  Recommended blood sugar levels on waking up are 90-130 and about 2 hours after meal is 130-160  Please bring your blood sugar monitor to each visit, thank you  Flippin I N AM      Elayne Snare 08/24/2020, 5:11 PM   Note: This office note was prepared with Dragon voice recognition system technology. Any transcriptional errors that result from this process are unintentional.

## 2020-08-22 NOTE — Patient Instructions (Addendum)
INSULIN 36 UNITS IN AM AND 26  AT DINNER  Check blood sugars on waking up 3-4  days a week  Also check blood sugars about 2 hours after meals and do this after different meals by rotation  Recommended blood sugar levels on waking up are 90-130 and about 2 hours after meal is 130-160  Please bring your blood sugar monitor to each visit, thank you  TAKE FARXIGA PILLS DAILY I N AM

## 2020-08-26 ENCOUNTER — Other Ambulatory Visit: Payer: Self-pay | Admitting: *Deleted

## 2020-08-26 NOTE — Patient Outreach (Signed)
[place] [facility]) Care Management  [place]  08/26/2020   Charles Parker 1945/12/13 [id number]  [place] telephone call to patient.  Hipaa compliance verified. Per patient his fasting blood sugar was 132 today. His A1C has increased to 9.6. Patient is exercising by walking 5 laps in the church parking lot 5 days a week. Patient ambulates with a cane and has a brace on left leg. Patient has not been taking his insulin correctly with the latest changes Dr Dwyane Dee prescribed. Patient was taking 32 am and at dinner. Patient was instructed to take 36 at breakfast and 26 at dinner. Patient has agreed to more frequent follow up phone calls to check on patient to see if he is compliant.  Encounter Medications:  Outpatient Encounter Medications as of 08/26/2020  Medication Sig Note  . Alcohol Swabs PADS Use to clean area before insulin injections. DX E11.8   . Avanafil (STENDRA) 200 MG TABS Take 1 tablet by mouth every 3 (three) days.   . Blood Glucose Monitoring Suppl (ONETOUCH VERIO FLEX SYSTEM) w/Device KIT 1 each by Does not apply route 3 (three) times daily. Use Onetouch Verio flex to check blood sugar three times daily.   . Cholecalciferol 50 MCG (2000 UT) TABS Take 1 tablet (2,000 Units total) by mouth daily.   Marland Kitchen FARXIGA 10 MG TABS tablet TAKE 1 TABLET BY MOUTH DAILY   . ferrous sulfate 325 (65 FE) MG tablet Take 1 tablet (325 mg total) by mouth 2 (two) times daily with a meal.   . gabapentin (NEURONTIN) 300 MG capsule TAKE 1 CAPSULE(300 MG) BY MOUTH TWICE DAILY   . glucose blood (ACCU-CHEK GUIDE) test strip Use TID   . glucose blood test strip Use Accu Chek Aviva Test Strips as instructed to check blood sugar three times daily.   . insulin aspart protamine - aspart (NOVOLOG MIX 70/30 FLEXPEN) (70-30) 100 UNIT/ML FlexPen Inject 26 units under the skin breakfast and at dinner. 08/26/2020: Take 36 units breakfast and 26 units dinner per Dr Dwyane Dee order 08/22/2020  .  Insulin Pen Needle (B-D UF III MINI PEN NEEDLES) 31G X 5 MM MISC ADMINISTER 30 UNITS EVERY EVENING   . Multiple Vitamin (MULTIVITAMIN WITH MINERALS) TABS tablet Take 1 tablet by mouth daily.   [name] Lancets 74Y MISC 1 each by Does not apply route 3 (three) times daily. Use Onetouch delica lancets to check blood sugar three times daily.   Marland Kitchen oxybutynin (DITROPAN XL) 15 MG 24 hr tablet Take 1 tablet (15 mg total) by mouth at bedtime.   . rivaroxaban (XARELTO) 10 MG TABS tablet Take 1 tablet (10 mg total) by mouth daily.   . simvastatin (ZOCOR) 40 MG tablet TAKE 1 TABLET(40 MG) BY MOUTH AT BEDTIME   . tamsulosin (FLOMAX) 0.4 MG CAPS capsule TAKE 1 CAPSULE(0.4 MG) BY MOUTH DAILY AFTER BREAKFAST   . TRUEPLUS LANCETS 33G MISC USE TO CHECK BLOOD SUGARS THREE TIMES DAY   . [DISCONTINUED] dapagliflozin propanediol (FARXIGA) 10 MG TABS tablet Take 10 mg by mouth daily.   . [DISCONTINUED] FARXIGA 10 MG TABS tablet TAKE 1 TABLET BY MOUTH DAILY   . [DISCONTINUED] simvastatin (ZOCOR) 40 MG tablet TAKE 1 TABLET(40 MG) BY MOUTH AT BEDTIME    No facility-administered encounter medications on file as of 08/26/2020.    Functional Status:  In your present state of health, do you have any difficulty performing the following activities: 12/12/2019 09/13/2019  Hearing? N N  Vision? N N  Difficulty concentrating or making decisions? N N  Walking or climbing stairs? Y N  Comment patient uses a cane -  Dressing or bathing? N N  Doing errands, shopping? N N  Preparing Food and eating ? N N  Using the Toilet? N N  In the past six months, have you accidently leaked urine? N N  Do you have problems with loss of bowel control? N N  Managing your Medications? Y N  Comment patient is having difficulty remembering to that 20u in am and 30u after dinner. -  Managing your Finances? N N  Housekeeping or managing your Housekeeping? N N  Some recent data might be hidden    Fall/Depression Screening: Fall Risk   08/26/2020 07/08/2020 06/06/2020  Falls in the past year? _0 Number falls in past yr: 0 0 0  Comment - - -  Injury with Fall? - 0 0  Risk Factor Category  - - -  Risk for fall due to : History of fall(s);Impaired balance/gait;Impaired mobility History of fall(s);Impaired balance/gait;Impaired mobility History of fall(s);Impaired balance/gait;Impaired mobility  Follow up Falls evaluation completed;Falls prevention discussed Falls evaluation completed Falls evaluation completed;Education provided;Falls prevention discussed  Comment - - discussed medical alert system also   PHQ 2/9 Scores 12/12/2019 06/19/2019 05/24/2019 04/18/2019 03/30/2019 03/29/2019 01/04/2018  PHQ - 2 Score 0 0 0 0 0 0 1  PHQ- 9 Score - - - - - - 3    Assessment:  Goals Addressed            This Visit's Progress   . Manage My Medicine   Not on track    Follow Up Date [id number]   - call for medicine refill 2 or 3 days before it runs out - call if I am sick and can't take my medicine - keep a list of all the medicines I take; vitamins and herbals too - use a pillbox to sort medicine    Why is this important?   These steps will help you keep on track with your medicines.    Notes:  RN discussed the correct dosage of insulin that the patient should be taking.  36 units at breakfast and 26 units at dinner       Plan:  Patient will take 36 units in am and 26 at dinner RN reiterated the am goal range of fasting blood sugar is 90-130 The dinner blood sugar  goal range is 130-160. Patient will continue to exercise by ambulating 5 laps RN will follow up with patient in 7-10 days RN sent update assessment to PCP  [place] Management [phone]

## 2020-08-26 NOTE — Patient Instructions (Signed)
Goals Addressed            This Visit's Progress   . Manage My Medicine   Not on track    Follow Up Date [id number]   - call for medicine refill 2 or 3 days before it runs out - call if I am sick and can't take my medicine - keep a list of all the medicines I take; vitamins and herbals too - use a pillbox to sort medicine    Why is this important?   These steps will help you keep on track with your medicines.    Notes:  RN discussed the correct dosage of insulin that the patient should be taking.  36 units at breakfast and 26 units at dinner

## 2020-09-05 ENCOUNTER — Other Ambulatory Visit: Payer: Self-pay | Admitting: *Deleted

## 2020-09-05 NOTE — Patient Outreach (Signed)
[place] [facility]) Care Management  09/05/2020  Charles Parker 1945/12/25 [id number]  RN Health Coach telephone call to patient.  Hipaa compliance verified. Per patient he checked his fasting blood sugar today and it was 156. Patient stated he is taking 32 am and 26 pm. RN explained again that he is suppose to be taking 36 am and 26 pm. RN reiterated that his fasting blood sugar that the Dr wants is to be between 90-130.  Plan:  Referral to [place] Management [phone]

## 2020-09-05 NOTE — Patient Outreach (Signed)
Referral from [name], RN to [email or web address] for medication Assistance.

## 2020-09-10 ENCOUNTER — Other Ambulatory Visit: Payer: Self-pay | Admitting: Internal Medicine

## 2020-09-10 DIAGNOSIS — E118 Type 2 diabetes mellitus with unspecified complications: Secondary | ICD-10-CM

## 2020-09-10 DIAGNOSIS — Z794 Long term (current) use of insulin: Secondary | ICD-10-CM

## 2020-09-10 DIAGNOSIS — E1165 Type 2 diabetes mellitus with hyperglycemia: Secondary | ICD-10-CM

## 2020-09-17 ENCOUNTER — Telehealth: Payer: Self-pay | Admitting: Internal Medicine

## 2020-09-17 DIAGNOSIS — E1165 Type 2 diabetes mellitus with hyperglycemia: Secondary | ICD-10-CM

## 2020-09-17 DIAGNOSIS — Z794 Long term (current) use of insulin: Secondary | ICD-10-CM

## 2020-09-17 NOTE — Telephone Encounter (Signed)
1.Medication Requested:insulin aspart protamine - aspart (NOVOLOG MIX 70/30 FLEXPEN) (70-30) 100 UNIT/ML FlexPen  rivaroxaban (XARELTO) 10 MG TABS tablet    2. Pharmacy (Name, Street, Spry): [facility], [address]  3. On Med List: yes   4. Last Visit with PCP: 12.21.20  5. Next visit date with PCP:n/a    Agent: Please be advised that RX refills may take up to 3 business days. We ask that you follow-up with your pharmacy.

## 2020-09-18 NOTE — Telephone Encounter (Signed)
LVM stating that an OV is needed for r/f

## 2020-09-18 NOTE — Telephone Encounter (Signed)
I need to see him to prescribe these

## 2020-09-21 ENCOUNTER — Other Ambulatory Visit: Payer: Self-pay | Admitting: Internal Medicine

## 2020-09-21 DIAGNOSIS — E785 Hyperlipidemia, unspecified: Secondary | ICD-10-CM

## 2020-09-21 DIAGNOSIS — N3281 Overactive bladder: Secondary | ICD-10-CM

## 2020-10-09 ENCOUNTER — Ambulatory Visit: Payer: Medicare Other | Admitting: Dietician

## 2020-10-10 ENCOUNTER — Other Ambulatory Visit: Payer: Self-pay | Admitting: Internal Medicine

## 2020-10-10 DIAGNOSIS — I824Y1 Acute embolism and thrombosis of unspecified deep veins of right proximal lower extremity: Secondary | ICD-10-CM

## 2020-10-14 ENCOUNTER — Ambulatory Visit (INDEPENDENT_AMBULATORY_CARE_PROVIDER_SITE_OTHER): Payer: Medicare Other | Admitting: Podiatry

## 2020-10-14 ENCOUNTER — Encounter: Payer: Self-pay | Admitting: Podiatry

## 2020-10-14 ENCOUNTER — Other Ambulatory Visit: Payer: Self-pay

## 2020-10-14 DIAGNOSIS — B351 Tinea unguium: Secondary | ICD-10-CM | POA: Diagnosis not present

## 2020-10-14 DIAGNOSIS — E1151 Type 2 diabetes mellitus with diabetic peripheral angiopathy without gangrene: Secondary | ICD-10-CM | POA: Diagnosis not present

## 2020-10-14 DIAGNOSIS — M2012 Hallux valgus (acquired), left foot: Secondary | ICD-10-CM | POA: Diagnosis not present

## 2020-10-14 DIAGNOSIS — M2011 Hallux valgus (acquired), right foot: Secondary | ICD-10-CM

## 2020-10-14 DIAGNOSIS — M2041 Other hammer toe(s) (acquired), right foot: Secondary | ICD-10-CM

## 2020-10-14 DIAGNOSIS — M205X2 Other deformities of toe(s) (acquired), left foot: Secondary | ICD-10-CM

## 2020-10-14 DIAGNOSIS — M2042 Other hammer toe(s) (acquired), left foot: Secondary | ICD-10-CM | POA: Diagnosis not present

## 2020-10-14 NOTE — Progress Notes (Signed)
This patient returns to my office for at risk foot care.  This patient requires this care by a professional since this patient will be at risk due to having diabetes type 2 and coagulation defect.  Patient is taking xarelto.  This patient is unable to cut nails himself since the patient cannot reach his nails.These nails are painful walking and wearing shoes.  This patient presents for at risk foot care today.  General Appearance  Alert, conversant and in no acute stress.  Vascular  Dorsalis pedis and posterior tibial  pulses are weakly palpable  bilaterally.  Capillary return is within normal limits  bilaterally. Temperature is within normal limits  bilaterally.  Neurologic  Senn-Weinstein monofilament wire test within normal limits  bilaterally. Muscle power within normal limits bilaterally.  Nails Thick disfigured discolored nails with subungual debris  from hallux to fifth toes bilaterally.  Orthopedic  No limitations of motion  feet .  No crepitus or effusions noted.  HAV with overlapping second digit  B/L.  Skin  normotropic skin with no porokeratosis noted bilaterally.  No signs of infections or ulcers noted.     Onychomycosis  Pain in right toes  Pain in left toes  Consent was obtained for treatment procedures.   Mechanical debridement of nails 1-5  bilaterally performed with a nail nipper.  Filed with dremel without incident.  Patient qualifies for diabetic shoes due to diabetes ,HAV and hammer toes  B/L. Dr.  Dwyane Dee denied his shoes.   Return office visit     3 months                Told patient to return for periodic foot care and evaluation due to potential at risk complications.   Gardiner Barefoot DPM

## 2020-10-21 ENCOUNTER — Other Ambulatory Visit: Payer: Self-pay | Admitting: *Deleted

## 2020-10-21 ENCOUNTER — Telehealth: Payer: Self-pay | Admitting: Podiatry

## 2020-10-21 DIAGNOSIS — Z794 Long term (current) use of insulin: Secondary | ICD-10-CM

## 2020-10-21 DIAGNOSIS — E1165 Type 2 diabetes mellitus with hyperglycemia: Secondary | ICD-10-CM

## 2020-10-21 DIAGNOSIS — E1140 Type 2 diabetes mellitus with diabetic neuropathy, unspecified: Secondary | ICD-10-CM

## 2020-10-21 DIAGNOSIS — E118 Type 2 diabetes mellitus with unspecified complications: Secondary | ICD-10-CM

## 2020-10-21 MED ORDER — GLUCOSE BLOOD VI STRP: ORAL_STRIP | 3 refills | Status: DC

## 2020-10-21 MED ORDER — BD PEN NEEDLE MINI U/F 31G X 5 MM MISC: 2 refills | Status: DC

## 2020-10-21 MED ORDER — ONETOUCH DELICA LANCETS 30G MISC: 1.0000 | Freq: Three times a day (TID) | 2 refills | Status: DC

## 2020-10-21 MED ORDER — ALCOHOL SWABS PADS: MEDICATED_PAD | 3 refills | Status: AC

## 2020-10-21 MED ORDER — DAPAGLIFLOZIN PROPANEDIOL 10 MG PO TABS: 10.0000 mg | ORAL_TABLET | Freq: Every day | ORAL | 1 refills | Status: DC

## 2020-10-21 MED ORDER — NOVOLOG MIX 70/30 FLEXPEN (70-30) 100 UNIT/ML ~~LOC~~ SUPN: PEN_INJECTOR | SUBCUTANEOUS | 2 refills | Status: DC

## 2020-10-21 MED ORDER — GABAPENTIN 300 MG PO CAPS: ORAL_CAPSULE | ORAL | 1 refills | Status: AC

## 2020-10-21 NOTE — Telephone Encounter (Signed)
Received a call from [facility] a nurse from [employer]  stating the pt had contacted them about not being able to get his diabetic shoes as Dr Dwyane Dee would not sign off on them. She has contacted his nurse and she has asked that we refax the paperwork and will get Dr Dwyane Dee to review again. She states pt has not gotten shoes in 3 yrs.  I have refaxed paperwork to Dr Dwyane Dee.

## 2020-10-21 NOTE — Patient Outreach (Addendum)
[place] [facility]) Care Management  [place]  10/21/2020   Charles Parker [birth year] [id number]  RN Health Coach telephone call to patient.  Hipaa compliance verified. Per patient his fasting blood sugar is 150. Per patient he has not been taking his insulin or [place]. He stated the pharmacy told him the Dr has  has not been refilled. Patient stated that he has only been taking three medications Simvastatin, oxybutynin and gabapentin. Per patient he had not had a pair of diabetic shoes in 3 years. Patient does lap exercise walking daily for exercise. Patient has agreed to follow up outreach calls.  Encounter Medications:  Outpatient Encounter Medications as of 10/21/2020  Medication Sig Note  . gabapentin (NEURONTIN) 300 MG capsule TAKE 1 CAPSULE(300 MG) BY MOUTH TWICE DAILY   . oxybutynin (DITROPAN XL) 15 MG 24 hr tablet Take 1 tablet (15 mg total) by mouth at bedtime.   . simvastatin (ZOCOR) 40 MG tablet TAKE 1 TABLET(40 MG) BY MOUTH AT BEDTIME   . Alcohol Swabs PADS Use to clean area before insulin injections. DX E11.8   . Avanafil (STENDRA) 200 MG TABS Take 1 tablet by mouth every 3 (three) days.   . Blood Glucose Monitoring Suppl (ONETOUCH VERIO FLEX SYSTEM) w/Device KIT 1 each by Does not apply route 3 (three) times daily. Use Onetouch Verio flex to check blood sugar three times daily.   . Cholecalciferol 50 MCG (2000 UT) TABS Take 1 tablet (2,000 Units total) by mouth daily.   Marland Kitchen FARXIGA 10 MG TABS tablet TAKE 1 TABLET BY MOUTH DAILY   . ferrous sulfate 325 (65 FE) MG tablet Take 1 tablet (325 mg total) by mouth 2 (two) times daily with a meal.   . glucose blood (ACCU-CHEK GUIDE) test strip Use TID   . glucose blood test strip Use Accu Chek Aviva Test Strips as instructed to check blood sugar three times daily.   . insulin aspart protamine - aspart (NOVOLOG MIX 70/30 FLEXPEN) (70-30) 100 UNIT/ML FlexPen Inject 26 units under the skin breakfast and at dinner.  08/26/2020: Take 36 units breakfast and 26 units dinner per Dr Dwyane Dee order 08/22/2020  . Insulin Pen Needle (B-D UF III MINI PEN NEEDLES) 31G X 5 MM MISC ADMINISTER 30 UNITS EVERY EVENING   . Multiple Vitamin (MULTIVITAMIN WITH MINERALS) TABS tablet Take 1 tablet by mouth daily.   [name] Lancets 69S MISC 1 each by Does not apply route 3 (three) times daily. Use Onetouch delica lancets to check blood sugar three times daily.   . rivaroxaban (XARELTO) 10 MG TABS tablet Take 1 tablet (10 mg total) by mouth daily.   . tamsulosin (FLOMAX) 0.4 MG CAPS capsule TAKE 1 CAPSULE(0.4 MG) BY MOUTH DAILY AFTER BREAKFAST   . TRUEPLUS LANCETS 33G MISC USE TO CHECK BLOOD SUGARS THREE TIMES DAY    No facility-administered encounter medications on file as of 10/21/2020.    Functional Status:  In your present state of health, do you have any difficulty performing the following activities: 12/12/2019  Hearing? N  Vision? N  Difficulty concentrating or making decisions? N  Walking or climbing stairs? Y  Comment patient uses a cane  Dressing or bathing? N  Doing errands, shopping? N  Preparing Food and eating ? N  Using the Toilet? N  In the past six months, have you accidently leaked urine? N  Do you have problems with loss of bowel control? N  Managing your Medications? Charles Parker  Comment patient is having difficulty remembering to that 20u in am and 30u after dinner.  Managing your Finances? N  Housekeeping or managing your Housekeeping? N  Some recent data might be hidden    Fall/Depression Screening: Fall Risk  10/21/2020 08/26/2020 07/08/2020  Falls in the past year? _0 Number falls in past yr: 0 0 0  Comment - - -  Injury with Fall? 0 - 0  Risk Factor Category  - - -  Risk for fall due to : History of fall(s);Impaired balance/gait;Impaired mobility History of fall(s);Impaired balance/gait;Impaired mobility History of fall(s);Impaired balance/gait;Impaired mobility  Follow up Falls  evaluation completed Falls evaluation completed;Falls prevention discussed Falls evaluation completed  Comment - - -   PHQ 2/9 Scores 12/12/2019 06/19/2019 05/24/2019 04/18/2019 03/30/2019 03/29/2019 01/04/2018  PHQ - 2 Score 0 0 0 0 0 0 1  PHQ- 9 Score - - - - - - 3    Assessment:  Goals Addressed            This Visit's Progress   . (THN)Make and Keep All Appointments   Not on track    Timeframe:  Long-Range Goal Priority:  Medium Start Date:    16109604                         Expected End Date:   54098119                   Follow Up Date 14782956  - call to cancel if needed - keep a calendar with prescription refill dates - keep a calendar with appointment dates    Why is this important?   Part of staying healthy is seeing the doctor for follow-up care.  If you forget your appointments, there are some things you can do to stay on track.    Notes:  Patient is not making his health maintenance appointments without encouragement and instructions    . [facility] My Medicine   Not on track    Timeframe:  Long-Range Goal Priority:  High Start Date: 21308657                            Expected End Date:   84696295                   Follow Up Date 28413244   - call for medicine refill 2 or 3 days before it runs out - call if I am sick and can't take my medicine - keep a list of all the medicines I take; vitamins and herbals too - use a pillbox to sort medicine    Why is this important?   These steps will help you keep on track with your medicines.    Notes:  RN discussed the correct dosage of insulin that the patient should be taking.  36 units at breakfast and 26 units at dinner Patient needed refill of medication. Pharmacy sent to incorrect physician and told patient it had not been refilled. Patient did not follow up. He has not been taking all his medications. Referred to pharmacy    . (THN)Monitor and Manage My Blood Sugar   On track    Timeframe:  Long-Range  Goal Priority:  High Start Date:   01027253  Expected End Date:  40347425                    Follow Up Date 95638756   - check blood sugar at prescribed times - check blood sugar if I feel it is too high or too low - enter blood sugar readings and medication or insulin into daily log - take the blood sugar log to all doctor visits - take the blood sugar meter to all doctor visits    Why is this important?   Checking your blood sugar at home helps to keep it from getting very high or very low.  Writing the results in a diary or log helps the doctor know how to care for you.  Your blood sugar log should have the time, date and the results.  Also, write down the amount of insulin or other medicine that you take.  Other information, like what you ate, exercise done and how you were feeling, will also be helpful.     Notes:  Patient ran out of strip. Prescription sent in. Patient stated he was unable to afford and did not make anyone aware. Have not checked blood sugars in over a month 10/21/2020 FBS 150    . [facility] Eye Exam   On track    Timeframe:  Long-Range Goal Priority:  Medium Start Date:     43329518                        Expected End Date:   84166063                   Follow Up Date 016010932 - keep appointment with eye doctor - schedule appointment with eye doctor    Why is this important?   Eye check-ups are important when you have diabetes.  Vision loss can be prevented.    Notes:  Last eye exam 11/12/2019    . (THN)Perform Foot Care   On track    Timeframe:  Long-Range Goal Priority:  Medium Start Date:   35573220                          Expected End Date:   25427062                   Follow Up Date 37628315   - check feet daily for cuts, sores or redness - trim toenails straight across - wash and dry feet carefully every day - wear comfortable, cotton socks - wear comfortable, well-fitting shoes    Why is this important?    Good foot care is very important when you have diabetes.  There are many things you can do to keep your feet healthy and catch a problem early.    Notes:  Foot care 04/25/2020 measured for shoes and toenails cut POD visit 10/14/2020       Plan:  Follow-up:  Patient agrees to Care Plan and Follow-up.  Referred to [place] RN scheduled appointment with Dr [name] 10/24/2020 at 8:40 RN contacted Dr Dwyane Dee office spoke with Suanne Marker for insulin and Wilder Glade  RN contacted Dr Prudence Davidson office to resend order for shoes to Dr Dwyane Dee per Suanne Marker Dr Dwyane Dee nurse RN discussed keeping appointment RN discussed follow up on Health Maintenance RN will follow up within the Month of February RN will send PCP update assessment  [place] Management [phone]

## 2020-10-21 NOTE — Patient Instructions (Signed)
Goals Addressed            This Visit's Progress   . (THN)Make and Keep All Appointments   Not on track    Timeframe:  Long-Range Goal Priority:  Medium Start Date:    60630160                         Expected End Date:   10932355                   Follow Up Date 73220254  - call to cancel if needed - keep a calendar with prescription refill dates - keep a calendar with appointment dates    Why is this important?   Part of staying healthy is seeing the doctor for follow-up care.  If you forget your appointments, there are some things you can do to stay on track.    Notes:  Patient is not making his health maintenance appointments without encouragement and instructions    . [facility] My Medicine   Not on track    Timeframe:  Long-Range Goal Priority:  High Start Date: 27062376                            Expected End Date:   28315176                   Follow Up Date 16073710   - call for medicine refill 2 or 3 days before it runs out - call if I am sick and can't take my medicine - keep a list of all the medicines I take; vitamins and herbals too - use a pillbox to sort medicine    Why is this important?   These steps will help you keep on track with your medicines.    Notes:  RN discussed the correct dosage of insulin that the patient should be taking.  36 units at breakfast and 26 units at dinner Patient needed refill of medication. Pharmacy sent to incorrect physician and told patient it had not been refilled. Patient did not follow up. He has not been taking all his medications. Referred to pharmacy    . (THN)Monitor and Manage My Blood Sugar   On track    Timeframe:  Long-Range Goal Priority:  High Start Date:   62694854                          Expected End Date:  62703500                    Follow Up Date 93818299   - check blood sugar at prescribed times - check blood sugar if I feel it is too high or too low - enter blood sugar readings and medication or  insulin into daily log - take the blood sugar log to all doctor visits - take the blood sugar meter to all doctor visits    Why is this important?   Checking your blood sugar at home helps to keep it from getting very high or very low.  Writing the results in a diary or log helps the doctor know how to care for you.  Your blood sugar log should have the time, date and the results.  Also, write down the amount of insulin or other medicine that you take.  Other information, like what you ate, exercise done  and how you were feeling, will also be helpful.     Notes:  Patient ran out of strip. Prescription sent in. Patient stated he was unable to afford and did not make anyone aware. Have not checked blood sugars in over a month 10/21/2020 FBS 150    . [facility] Eye Exam   On track    Timeframe:  Long-Range Goal Priority:  Medium Start Date:     17001749                        Expected End Date:   44967591                   Follow Up Date 638466599 - keep appointment with eye doctor - schedule appointment with eye doctor    Why is this important?   Eye check-ups are important when you have diabetes.  Vision loss can be prevented.    Notes:  Last eye exam 11/12/2019    . (THN)Perform Foot Care   On track    Timeframe:  Long-Range Goal Priority:  Medium Start Date:   35701779                          Expected End Date:   39030092                   Follow Up Date 33007622   - check feet daily for cuts, sores or redness - trim toenails straight across - wash and dry feet carefully every day - wear comfortable, cotton socks - wear comfortable, well-fitting shoes    Why is this important?   Good foot care is very important when you have diabetes.  There are many things you can do to keep your feet healthy and catch a problem early.    Notes:  Foot care 04/25/2020 measured for shoes and toenails cut POD visit 10/14/2020

## 2020-10-21 NOTE — Patient Outreach (Signed)
[place] [facility]) Care Management  10/21/2020  MARVIN GRABILL 1946/06/01 [id number]  Referral request for medication assistance from [name], RN sent to [employer].  [facility] Management Assistant [phone]

## 2020-10-22 NOTE — Telephone Encounter (Signed)
Lets see if insurance has better luck; maybe that is what we need to do more often...have the patient's medicare plan get involved in the process.

## 2020-10-24 ENCOUNTER — Other Ambulatory Visit: Payer: Self-pay

## 2020-10-24 ENCOUNTER — Ambulatory Visit: Payer: Medicare Other | Admitting: Internal Medicine

## 2020-10-28 ENCOUNTER — Other Ambulatory Visit: Payer: Self-pay | Admitting: *Deleted

## 2020-10-28 ENCOUNTER — Other Ambulatory Visit: Payer: Self-pay

## 2020-10-28 ENCOUNTER — Telehealth: Payer: Self-pay | Admitting: *Deleted

## 2020-10-28 ENCOUNTER — Other Ambulatory Visit (INDEPENDENT_AMBULATORY_CARE_PROVIDER_SITE_OTHER): Payer: Medicare Other

## 2020-10-28 ENCOUNTER — Telehealth: Payer: Self-pay

## 2020-10-28 DIAGNOSIS — Z794 Long term (current) use of insulin: Secondary | ICD-10-CM

## 2020-10-28 DIAGNOSIS — E1165 Type 2 diabetes mellitus with hyperglycemia: Secondary | ICD-10-CM | POA: Diagnosis not present

## 2020-10-28 DIAGNOSIS — E118 Type 2 diabetes mellitus with unspecified complications: Secondary | ICD-10-CM

## 2020-10-28 LAB — BASIC METABOLIC PANEL
BUN: 18 mg/dL (ref 6–23)
CO2: 26 mEq/L (ref 19–32)
Calcium: 9.5 mg/dL (ref 8.4–10.5)
Chloride: 93 mEq/L — ABNORMAL LOW (ref 96–112)
Creatinine, Ser: 1.27 mg/dL (ref 0.40–1.50)
GFR: 55.59 mL/min — ABNORMAL LOW (ref >60.00)
Glucose, Bld: 529 mg/dL (ref 70–99)
Potassium: 3.6 mEq/L (ref 3.5–5.1)
Sodium: 131 mEq/L — ABNORMAL LOW (ref 135–145)

## 2020-10-28 MED ORDER — DAPAGLIFLOZIN PROPANEDIOL 10 MG PO TABS: 10.0000 mg | ORAL_TABLET | Freq: Every day | ORAL | 1 refills | Status: DC

## 2020-10-28 MED ORDER — NOVOLOG MIX 70/30 FLEXPEN (70-30) 100 UNIT/ML ~~LOC~~ SUPN: PEN_INJECTOR | SUBCUTANEOUS | 2 refills | Status: DC

## 2020-10-28 NOTE — Telephone Encounter (Signed)
Main lab called with a critical glucose of 529.

## 2020-10-28 NOTE — Telephone Encounter (Signed)
Yes he has been out of insulin for at least a month, new rx's for his novolog and farxiga have been sent to his pharmacy and he is aware.

## 2020-10-28 NOTE — Telephone Encounter (Signed)
Please find out if he ran out of insulin and how much he is taking

## 2020-10-28 NOTE — Telephone Encounter (Signed)
-----   Message from Elayne Snare, MD sent at 10/28/2020  2:10 PM EST ----- Regarding: FW: labs Please find out if he is taking his insulin and checking his sugars ----- Message ----- From: Kaylyn Lim I Sent: 10/28/2020   2:08 PM EST To: Elayne Snare, MD Subject: labs                                           Main lab just called with a critical glucose of 529.

## 2020-10-29 LAB — FRUCTOSAMINE: Fructosamine: 660 umol/L — ABNORMAL HIGH (ref 0–285)

## 2020-10-31 ENCOUNTER — Ambulatory Visit: Payer: Medicare Other | Admitting: Endocrinology

## 2020-11-03 ENCOUNTER — Other Ambulatory Visit: Payer: Self-pay | Admitting: *Deleted

## 2020-11-03 ENCOUNTER — Ambulatory Visit: Payer: Medicare Other | Admitting: Internal Medicine

## 2020-11-03 ENCOUNTER — Telehealth: Payer: Medicare Other

## 2020-11-03 MED ORDER — INSULIN LISPRO PROT & LISPRO (75-25 MIX) 100 UNIT/ML ~~LOC~~ SUSP: 26.0000 [IU] | Freq: Two times a day (BID) | SUBCUTANEOUS | 1 refills | Status: DC

## 2020-11-04 ENCOUNTER — Other Ambulatory Visit: Payer: Self-pay | Admitting: *Deleted

## 2020-11-04 NOTE — Patient Outreach (Signed)
[place] [facility]) Care Management  11/04/2020  Charles Parker 20-Sep-1946 [id number]  RN Health Coach attempted follow up outreach call to patient.  Patient was unavailable.  Plan: RN will call patient again within 30 days.  [place] Care Management [phone]

## 2020-11-07 ENCOUNTER — Encounter: Payer: Self-pay | Admitting: Internal Medicine

## 2020-11-07 ENCOUNTER — Ambulatory Visit (INDEPENDENT_AMBULATORY_CARE_PROVIDER_SITE_OTHER): Payer: Medicare Other | Admitting: Internal Medicine

## 2020-11-07 ENCOUNTER — Ambulatory Visit: Payer: Self-pay | Admitting: *Deleted

## 2020-11-07 ENCOUNTER — Other Ambulatory Visit: Payer: Self-pay

## 2020-11-07 VITALS — BP 134/84 | HR 88 | Temp 98.4°F | Ht 70.0 in | Wt 189.0 lb

## 2020-11-07 DIAGNOSIS — Z0001 Encounter for general adult medical examination with abnormal findings: Secondary | ICD-10-CM

## 2020-11-07 DIAGNOSIS — E785 Hyperlipidemia, unspecified: Secondary | ICD-10-CM

## 2020-11-07 DIAGNOSIS — B3742 Candidal balanitis: Secondary | ICD-10-CM | POA: Diagnosis not present

## 2020-11-07 DIAGNOSIS — Z794 Long term (current) use of insulin: Secondary | ICD-10-CM

## 2020-11-07 DIAGNOSIS — N401 Enlarged prostate with lower urinary tract symptoms: Secondary | ICD-10-CM | POA: Diagnosis not present

## 2020-11-07 DIAGNOSIS — E118 Type 2 diabetes mellitus with unspecified complications: Secondary | ICD-10-CM

## 2020-11-07 DIAGNOSIS — E1165 Type 2 diabetes mellitus with hyperglycemia: Secondary | ICD-10-CM

## 2020-11-07 DIAGNOSIS — R3916 Straining to void: Secondary | ICD-10-CM | POA: Diagnosis not present

## 2020-11-07 DIAGNOSIS — D6859 Other primary thrombophilia: Secondary | ICD-10-CM | POA: Diagnosis not present

## 2020-11-07 DIAGNOSIS — I10 Essential (primary) hypertension: Secondary | ICD-10-CM

## 2020-11-07 DIAGNOSIS — E611 Iron deficiency: Secondary | ICD-10-CM | POA: Diagnosis not present

## 2020-11-07 DIAGNOSIS — E559 Vitamin D deficiency, unspecified: Secondary | ICD-10-CM

## 2020-11-07 DIAGNOSIS — Z0000 Encounter for general adult medical examination without abnormal findings: Secondary | ICD-10-CM | POA: Diagnosis not present

## 2020-11-07 LAB — URINALYSIS, ROUTINE W REFLEX MICROSCOPIC
Bilirubin Urine: NEGATIVE
Hgb urine dipstick: NEGATIVE
Ketones, ur: NEGATIVE
Leukocytes,Ua: NEGATIVE
Nitrite: NEGATIVE
Specific Gravity, Urine: 1.01 (ref 1.000–1.030)
Total Protein, Urine: NEGATIVE
Urine Glucose: >=1000 — AB
Urobilinogen, UA: 0.2 (ref 0.0–1.0)
pH: 5.5 (ref 5.0–8.0)

## 2020-11-07 LAB — BASIC METABOLIC PANEL
BUN: 13 mg/dL (ref 6–23)
CO2: 29 mEq/L (ref 19–32)
Calcium: 9.7 mg/dL (ref 8.4–10.5)
Chloride: 99 mEq/L (ref 96–112)
Creatinine, Ser: 1.1 mg/dL (ref 0.40–1.50)
GFR: 66.04 mL/min (ref >60.00)
Glucose, Bld: 456 mg/dL — ABNORMAL HIGH (ref 70–99)
Potassium: 5.3 mEq/L — ABNORMAL HIGH (ref 3.5–5.1)
Sodium: 134 mEq/L — ABNORMAL LOW (ref 135–145)

## 2020-11-07 LAB — MICROALBUMIN / CREATININE URINE RATIO
Creatinine,U: 48.8 mg/dL
Microalb Creat Ratio: 1.4 mg/g (ref 0.0–30.0)
Microalb, Ur: <0.7 mg/dL (ref 0.0–1.9)

## 2020-11-07 LAB — CBC WITH DIFFERENTIAL/PLATELET
Basophils Absolute: 0 10*3/uL (ref 0.0–0.1)
Basophils Relative: 0.5 % (ref 0.0–3.0)
Eosinophils Absolute: 0 10*3/uL (ref 0.0–0.7)
Eosinophils Relative: 0.6 % (ref 0.0–5.0)
HCT: 44.3 % (ref 39.0–52.0)
Hemoglobin: 15 g/dL (ref 13.0–17.0)
Lymphocytes Relative: 27.5 % (ref 12.0–46.0)
Lymphs Abs: 1.1 10*3/uL (ref 0.7–4.0)
MCHC: 33.8 g/dL (ref 30.0–36.0)
MCV: 92.6 fl (ref 78.0–100.0)
Monocytes Absolute: 0.5 10*3/uL (ref 0.1–1.0)
Monocytes Relative: 11.9 % (ref 3.0–12.0)
Neutro Abs: 2.3 10*3/uL (ref 1.4–7.7)
Neutrophils Relative %: 59.5 % (ref 43.0–77.0)
Platelets: 134 10*3/uL — ABNORMAL LOW (ref 150.0–400.0)
RBC: 4.78 Mil/uL (ref 4.22–5.81)
RDW: 13 % (ref 11.5–15.5)
WBC: 3.9 10*3/uL — ABNORMAL LOW (ref 4.0–10.5)

## 2020-11-07 LAB — POCT GLYCOSYLATED HEMOGLOBIN (HGB A1C): Hemoglobin A1C: 13.5 % — AB (ref 4.0–5.6)

## 2020-11-07 LAB — IRON: Iron: 102 ug/dL (ref 42–165)

## 2020-11-07 LAB — VITAMIN D 25 HYDROXY (VIT D DEFICIENCY, FRACTURES): VITD: 21.1 ng/mL — ABNORMAL LOW (ref 30.00–100.00)

## 2020-11-07 LAB — PSA: PSA: 1.82 ng/mL (ref 0.10–4.00)

## 2020-11-07 LAB — FERRITIN: Ferritin: 1231.1 ng/mL — ABNORMAL HIGH (ref 22.0–322.0)

## 2020-11-07 LAB — TSH: TSH: 1.55 u[IU]/mL (ref 0.35–4.50)

## 2020-11-07 MED ORDER — RIVAROXABAN 10 MG PO TABS: 10.0000 mg | ORAL_TABLET | Freq: Every day | ORAL | 1 refills | Status: DC

## 2020-11-07 MED ORDER — FLUCONAZOLE 100 MG PO TABS: 100.0000 mg | ORAL_TABLET | Freq: Every day | ORAL | 0 refills | Status: AC

## 2020-11-07 MED ORDER — GVOKE HYPOPEN 2-PACK 1 MG/0.2ML ~~LOC~~ SOAJ: 1.0000 | Freq: Every day | SUBCUTANEOUS | 5 refills | Status: DC | PRN

## 2020-11-07 MED ORDER — GLIMEPIRIDE 4 MG PO TABS: 4.0000 mg | ORAL_TABLET | Freq: Every day | ORAL | 0 refills | Status: DC

## 2020-11-07 NOTE — Progress Notes (Signed)
Subjective:  Patient ID: Charles Parker, male    DOB: Nov 07, 1945  Age: 75 y.o. MRN: [id number]  CC: Annual Exam, Hypertension, Hyperlipidemia, and Diabetes  This visit occurred during the SARS-CoV-2 public health emergency.  Safety protocols were in place, including screening questions prior to the visit, additional usage of staff PPE, and extensive cleaning of exam room while observing appropriate contact time as indicated for disinfecting solutions.    HPI [name] presents for a CPX.  He tells me his blood sugars have not been well controlled and he complains of polys.  He is active and denies any recent episodes of chest pain, shortness of breath, palpitations, edema, fatigue, or diaphoresis.  Outpatient Medications Prior to Visit  Medication Sig Dispense Refill  . Alcohol Swabs PADS Use to clean area before insulin injections. DX E11.8 200 each 3  . Blood Glucose Monitoring Suppl (ONETOUCH VERIO FLEX SYSTEM) w/Device KIT 1 each by Does not apply route 3 (three) times daily. Use Onetouch Verio flex to check blood sugar three times daily. 1 kit 0  . Cholecalciferol 50 MCG (2000 UT) TABS Take 1 tablet (2,000 Units total) by mouth daily. 90 tablet 1  . ferrous sulfate 325 (65 FE) MG tablet Take 1 tablet (325 mg total) by mouth 2 (two) times daily with a meal. 180 tablet 1  . gabapentin (NEURONTIN) 300 MG capsule TAKE 1 CAPSULE(300 MG) BY MOUTH TWICE DAILY 180 capsule 1  . glucose blood (ACCU-CHEK GUIDE) test strip Use TID 300 each 1  . glucose blood test strip Use Accu Chek Aviva Test Strips as instructed to check blood sugar three times daily. 100 each 3  . insulin aspart protamine - aspart (NOVOLOG MIX 70/30 FLEXPEN) (70-30) 100 UNIT/ML FlexPen Inject 26 units under the skin breakfast and at dinner. 60 mL 2  . insulin lispro protamine-lispro (HUMALOG 75/25 MIX) (75-25) 100 UNIT/ML SUSP injection Inject 26 Units into the skin 2 (two) times daily with a meal. Inject 26 units at  breakfast and dinner 20 mL 1  . Insulin Pen Needle (B-D UF III MINI PEN NEEDLES) 31G X 5 MM MISC ADMINISTER 30 UNITS EVERY EVENING 100 each 2  . Multiple Vitamin (MULTIVITAMIN WITH MINERALS) TABS tablet Take 1 tablet by mouth daily.    [name] Lancets 41L MISC 1 each by Does not apply route 3 (three) times daily. Use Onetouch delica lancets to check blood sugar three times daily. 100 each 2  . oxybutynin (DITROPAN XL) 15 MG 24 hr tablet Take 1 tablet (15 mg total) by mouth at bedtime. 90 tablet 1  . simvastatin (ZOCOR) 40 MG tablet TAKE 1 TABLET(40 MG) BY MOUTH AT BEDTIME 90 tablet 0  . tamsulosin (FLOMAX) 0.4 MG CAPS capsule TAKE 1 CAPSULE(0.4 MG) BY MOUTH DAILY AFTER BREAKFAST 30 capsule 0  . TRUEPLUS LANCETS 33G MISC USE TO CHECK BLOOD SUGARS THREE TIMES DAY 300 each 1  . Avanafil (STENDRA) 200 MG TABS Take 1 tablet by mouth every 3 (three) days. 10 tablet 5  . dapagliflozin propanediol (FARXIGA) 10 MG TABS tablet Take 1 tablet (10 mg total) by mouth daily. 90 tablet 1  . rivaroxaban (XARELTO) 10 MG TABS tablet Take 1 tablet (10 mg total) by mouth daily. 90 tablet 1   No facility-administered medications prior to visit.    ROS Review of Systems  Constitutional: Negative for appetite change, chills, diaphoresis, fatigue and fever.  HENT: Negative.   Eyes: Negative.   Respiratory: Negative  for cough, chest tightness, shortness of breath and wheezing.   Cardiovascular: Negative for chest pain, palpitations and leg swelling.  Gastrointestinal: Negative for abdominal pain, blood in stool, diarrhea, nausea and vomiting.  Endocrine: Positive for polydipsia and polyuria. Negative for polyphagia.  Genitourinary: Negative.  Negative for difficulty urinating and dysuria.  Musculoskeletal: Negative for arthralgias, back pain, myalgias and neck pain.  Skin: Negative.  Negative for color change and pallor.  Neurological: Negative.  Negative for dizziness, weakness, light-headedness and  numbness.  Hematological: Negative for adenopathy. Does not bruise/bleed easily.    Objective:  BP 134/84   Pulse 88   Temp 98.4 F (36.9 C) (Oral)   Ht '5\' 10"'  (1.778 m)   Wt 189 lb (85.7 kg)   SpO2 97%   BMI 27.12 kg/m   BP Readings from Last 3 Encounters:  11/07/20 134/84  08/22/20 122/82  07/11/20 138/68    Wt Readings from Last 3 Encounters:  11/07/20 189 lb (85.7 kg)  08/22/20 202 lb 9.6 oz (91.9 kg)  07/11/20 197 lb (89.4 kg)    Physical Exam Vitals reviewed.  Constitutional:      Appearance: Normal appearance.  HENT:     Nose: Nose normal.     Mouth/Throat:     Mouth: Mucous membranes are moist.  Eyes:     General: No scleral icterus.    Conjunctiva/sclera: Conjunctivae normal.  Cardiovascular:     Rate and Rhythm: Normal rate and regular rhythm.     Heart sounds: No murmur heard.     Comments: EKG- NSR, 76 bpm Normal EKG Pulmonary:     Effort: Pulmonary effort is normal.     Breath sounds: No stridor. No wheezing, rhonchi or rales.  Abdominal:     General: Abdomen is flat. Bowel sounds are normal. There is no distension.     Palpations: Abdomen is soft. There is no hepatomegaly, splenomegaly or mass.     Tenderness: There is no abdominal tenderness.     Hernia: No hernia is present. There is no hernia in the left inguinal area or right inguinal area.  Genitourinary:    Pubic Area: No rash.      Penis: Circumcised. Discharge present. No erythema, swelling or lesions.      Testes: Normal.        Right: Mass, tenderness or swelling not present.        Left: Mass, tenderness or swelling not present.     Epididymis:     Right: Normal. Not enlarged. No mass.     Left: Normal. Not enlarged. No mass.     Prostate: Enlarged. Not tender and no nodules present.     Rectum: Normal. Guaiac result negative. No mass, tenderness, anal fissure, external hemorrhoid or internal hemorrhoid. Normal anal tone.     Comments: There is a white exudate coating his glans  and prepuce. Musculoskeletal:     Cervical back: Neck supple.     Right lower leg: No edema.     Left lower leg: No edema.  Lymphadenopathy:     Cervical: No cervical adenopathy.     Lower Body: No right inguinal adenopathy. No left inguinal adenopathy.  Skin:    General: Skin is warm and dry.     Coloration: Skin is not pale.  Neurological:     General: No focal deficit present.     Mental Status: He is alert.  Psychiatric:        Mood and Affect: Mood normal.  Behavior: Behavior normal.     Lab Results  Component Value Date   WBC 3.9 (L) 11/07/2020   HGB 15.0 11/07/2020   HCT 44.3 11/07/2020   PLT 134.0 (L) 11/07/2020   GLUCOSE 456 (H) 11/07/2020   CHOL 153 08/19/2020   TRIG 130.0 08/19/2020   HDL 43.00 08/19/2020   LDLDIRECT 115.0 07/02/2019   LDLCALC 84 08/19/2020   ALT 18 08/19/2020   AST 18 08/19/2020   NA 134 (L) 11/07/2020   K 5.3 No hemolysis seen (H) 11/07/2020   CL 99 11/07/2020   CREATININE 1.10 11/07/2020   BUN 13 11/07/2020   CO2 29 11/07/2020   TSH 1.55 11/07/2020   PSA 1.82 11/07/2020   INR 1.37 09/26/2016   HGBA1C 13.5 (A) 11/07/2020   MICROALBUR <0.7 11/07/2020    No results found.  Assessment & Plan:   Ezreal was seen today for annual exam, hypertension, hyperlipidemia and diabetes.  Diagnoses and all orders for this visit:  Essential hypertension- His blood pressure is adequately well controlled. -     Basic metabolic panel; Future -     Urinalysis, Routine w reflex microscopic; Future -     Urinalysis, Routine w reflex microscopic -     Basic metabolic panel -     EKG [id number]  Type 2 diabetes mellitus with complication, with long-term current use of insulin ([place])- His A1c is up to 13.5%.  I do not think it safe for him to take an SGLT2 inhibitor at this time.  I recommended that he be more compliant with his insulin regimen and to start taking a sulfonylurea. -     Urinalysis, Routine w reflex microscopic; Future -      Microalbumin / creatinine urine ratio; Future -     Microalbumin / creatinine urine ratio -     Urinalysis, Routine w reflex microscopic -     Glucagon (GVOKE HYPOPEN 2-PACK) 1 MG/0.2ML SOAJ; Inject 1 Act into the skin daily as needed. -     POCT glycosylated hemoglobin (Hb A1C) -     glimepiride (AMARYL) 4 MG tablet; Take 1 tablet (4 mg total) by mouth daily before breakfast.  Benign prostatic hyperplasia (BPH) with straining on urination- His PSA is normal which is a reassuring sign that he does not have prostate cancer.  He has no symptoms that need to be treated. -     PSA; Future -     PSA  Hyperlipidemia with target LDL less than 70- He is doing well on the statin. -     Cancel: Lipid panel; Future -     TSH; Future -     Cancel: Hepatic function panel; Future -     TSH  Iron deficiency- His H&H are normal now. -     CBC with Differential/Platelet; Future -     Iron; Future -     Ferritin; Future -     Ferritin -     Iron -     CBC with Differential/Platelet  Vitamin D insufficiency -     VITAMIN D 25 Hydroxy (Vit-D Deficiency, Fractures); Future -     VITAMIN D 25 Hydroxy (Vit-D Deficiency, Fractures)  Encounter for general adult medical examination with abnormal findings- Exam completed, labs reviewed, vaccines reviewed and updated, cancer screenings are up-to-date, patient education material was given.  Uncontrolled type 2 diabetes mellitus with hyperglycemia, with long-term current use of insulin ([facility]) -     Glucagon (GVOKE HYPOPEN 2-PACK) 1  MG/0.2ML SOAJ; Inject 1 Act into the skin daily as needed. -     Consult to [place] Management -     Amb Referral to Nutrition and Diabetic Education -     glimepiride (AMARYL) 4 MG tablet; Take 1 tablet (4 mg total) by mouth daily before breakfast.  Hypercoagulable state, primary ([place])- He has had no recurrent thrombolic events.  Will continue the current dose of the DOAC. -     rivaroxaban (XARELTO) 10 MG TABS tablet; Take 1  tablet (10 mg total) by mouth daily.  Candidal balanitis -     fluconazole (DIFLUCAN) 100 MG tablet; Take 1 tablet (100 mg total) by mouth daily for 7 days.   I have discontinued [employer]el. [place]oun[place] "A.D."'s [place] and dapagliflozin propanediol. I am also having him start on Gvoke HypoPen 2-Pack, fluconazole, glimepiride, FreeStyle Libre 2 Sensor, [employer] 2 Reader, and Cholecalciferol. Additionally, I am having him maintain his TRUEplus Lancets 33G, multivitamin with minerals, ferrous sulfate, tamsulosin, OneTouch Verio Flex System, Cholecalciferol, oxybutynin, Accu-Chek Guide, simvastatin, Alcohol Swabs, gabapentin, glucose blood, B-D UF III MINI PEN NEEDLES, OneTouch Delica Lancets 25Z, NovoLOG Mix 70/30 FlexPen, insulin lispro protamine-lispro, and rivaroxaban.  Meds ordered this encounter  Medications  . rivaroxaban (XARELTO) 10 MG TABS tablet    Sig: Take 1 tablet (10 mg total) by mouth daily.    Dispense:  90 tablet    Refill:  1  . Glucagon (GVOKE HYPOPEN 2-PACK) 1 MG/0.2ML SOAJ    Sig: Inject 1 Act into the skin daily as needed.    Dispense:  2 mL    Refill:  5  . fluconazole (DIFLUCAN) 100 MG tablet    Sig: Take 1 tablet (100 mg total) by mouth daily for 7 days.    Dispense:  7 tablet    Refill:  0  . glimepiride (AMARYL) 4 MG tablet    Sig: Take 1 tablet (4 mg total) by mouth daily before breakfast.    Dispense:  90 tablet    Refill:  0  . Continuous Blood Gluc Sensor (FREESTYLE LIBRE 2 SENSOR) MISC    Sig: 1 Act by Does not apply route daily.    Dispense:  2 each    Refill:  5  . Continuous Blood Gluc Receiver (FREESTYLE LIBRE 2 READER) DEVI    Sig: 1 Act by Does not apply route daily.    Dispense:  2 each    Refill:  5  . Cholecalciferol 50 MCG (2000 UT) TABS    Sig: Take 1 tablet (2,000 Units total) by mouth daily.    Dispense:  90 tablet    Refill:  3     Follow-up: Return in about 3 months (around 02/04/2021).  Scarlette Calico, MD

## 2020-11-07 NOTE — Patient Instructions (Signed)
Type 2 Diabetes Mellitus, Diagnosis, Adult Type 2 diabetes (type 2 diabetes mellitus) is a long-term, or chronic, disease. In type 2 diabetes, one or both of these problems may be present:  The pancreas does not make enough of a hormone called insulin.  Cells in the body do not respond properly to insulin that the body makes (insulin resistance). Normally, insulin allows blood sugar (glucose) to enter cells in the body. The cells use glucose for energy. Insulin resistance or lack of insulin causes excess glucose to build up in the blood instead of going into cells. This causes high blood glucose (hyperglycemia).  What are the causes? The exact cause of type 2 diabetes is not known. What increases the risk? The following factors may make you more likely to develop this condition:  Having a family member with type 2 diabetes.  Being overweight or obese.  Being inactive (sedentary).  Having been diagnosed with insulin resistance.  Having a history of prediabetes, diabetes when you were pregnant (gestational diabetes), or polycystic ovary syndrome (PCOS). What are the signs or symptoms? In the early stage of this condition, you may not have symptoms. Symptoms develop slowly and may include:  Increased thirst or hunger.  Increased urination.  Unexplained weight loss.  Tiredness (fatigue) or weakness.  Vision changes, such as blurry vision.  Dark patches on the skin. How is this diagnosed? This condition is diagnosed based on your symptoms, your medical history, a physical exam, and your blood glucose level. Your blood glucose may be checked with one or more of the following blood tests:  A fasting blood glucose (FBG) test. You will not be allowed to eat (you will fast) for 8 hours or longer before a blood sample is taken.  A random blood glucose test. This test checks blood glucose at any time of day regardless of when you ate.  An A1C (hemoglobin A1C) blood test. This test  provides information about blood glucose levels over the previous 2-3 months.  An oral glucose tolerance test (OGTT). This test measures your blood glucose at two times: ? After fasting. This is your baseline blood glucose level. ? Two hours after drinking a beverage that contains glucose. You may be diagnosed with type 2 diabetes if:  Your fasting blood glucose level is 126 mg/dL (7.0 mmol/L) or higher.  Your random blood glucose level is 200 mg/dL (11.1 mmol/L) or higher.  Your A1C level is 6.5% or higher.  Your oral glucose tolerance test result is higher than 200 mg/dL (11.1 mmol/L). These blood tests may be repeated to confirm your diagnosis.   How is this treated? Your treatment may be managed by a specialist called an endocrinologist. Type 2 diabetes may be treated by following instructions from your health care provider about:  Making dietary and lifestyle changes. These may include: ? Following a personalized nutrition plan that is developed by a registered dietitian. ? Exercising regularly. ? Finding ways to manage stress.  Checking your blood glucose level as often as told.  Taking diabetes medicines or insulin daily. This helps to keep your blood glucose levels in the healthy range.  Taking medicines to help prevent complications from diabetes. Medicines may include: ? Aspirin. ? Medicine to lower cholesterol. ? Medicine to control blood pressure. Your health care provider will set treatment goals for you. Your goals will be based on your age, other medical conditions you have, and how you respond to diabetes treatment. Generally, the goal of treatment is to maintain the   following blood glucose levels:  Before meals: 80-130 mg/dL (4.4-7.2 mmol/L).  After meals: below 180 mg/dL (10 mmol/L).  A1C level: less than 7%. Follow these instructions at home: Questions to ask your health care provider Consider asking the following questions:  Should I meet with a certified  diabetes care and education specialist?  What diabetes medicines do I need, and when should I take them?  What equipment will I need to manage my diabetes at home?  How often do I need to check my blood glucose?  Where can I find a support group for people with diabetes?  What number can I call if I have questions?  When is my next appointment? General instructions  Take over-the-counter and prescription medicines only as told by your health care provider.  Keep all follow-up visits as told by your health care provider. This is important. Where to find more information  American Diabetes Association (ADA): www.diabetes.org  American Association of Diabetes Care and Education Specialists (ADCES): www.diabeteseducator.org  International Diabetes Federation (IDF): www.idf.org Contact a health care provider if:  Your blood glucose is at or above 240 mg/dL (13.3 mmol/L) for 2 days in a row.  You have been sick or have had a fever for 2 days or longer, and you are not getting better.  You have any of the following problems for more than 6 hours: ? You cannot eat or drink. ? You have nausea and vomiting. ? You have diarrhea. Get help right away if:  You have severe hypoglycemia. This means your blood glucose is lower than 54 mg/dL (3.0 mmol/L).  You become confused or you have trouble thinking clearly.  You have difficulty breathing.  You have moderate or large ketone levels in your urine. These symptoms may represent a serious problem that is an emergency. Do not wait to see if the symptoms will go away. Get medical help right away. Call your local emergency services (911 in the [place] Do not drive yourself to the hospital. Summary  Type 2 diabetes (type 2 diabetes mellitus) is a long-term, or chronic, disease. In type 2 diabetes, the pancreas does not make enough of a hormone called insulin, or cells in the body do not respond properly to insulin that the body makes (insulin  resistance).  This condition is treated by making dietary and lifestyle changes and taking diabetes medicines or insulin.  Your health care provider will set treatment goals for you. Your goals will be based on your age, other medical conditions you have, and how you respond to diabetes treatment.  Keep all follow-up visits as told by your health care provider. This is important. This information is not intended to replace advice given to you by your health care provider. Make sure you discuss any questions you have with your health care provider. Document Revised: 04/16/2020 Document Reviewed: 04/16/2020 Elsevier Patient Education  2021 Elsevier Inc.  

## 2020-11-08 MED ORDER — CHOLECALCIFEROL 50 MCG (2000 UT) PO TABS: 1.0000 | ORAL_TABLET | Freq: Every day | ORAL | 3 refills | Status: DC

## 2020-11-08 MED ORDER — FREESTYLE LIBRE 2 SENSOR MISC: 1.0000 | Freq: Every day | 5 refills | Status: DC

## 2020-11-08 MED ORDER — FREESTYLE LIBRE 2 READER DEVI
1.0000 | Freq: Every day | 5 refills | Status: DC
Start: 2020-11-08 — End: 2020-11-17

## 2020-11-11 ENCOUNTER — Ambulatory Visit: Payer: Self-pay | Admitting: *Deleted

## 2020-11-12 ENCOUNTER — Encounter: Payer: Medicare Other | Attending: Internal Medicine | Admitting: Registered"

## 2020-11-12 ENCOUNTER — Encounter: Payer: Self-pay | Admitting: Registered"

## 2020-11-12 ENCOUNTER — Other Ambulatory Visit: Payer: Self-pay

## 2020-11-12 ENCOUNTER — Encounter: Payer: Self-pay | Admitting: Internal Medicine

## 2020-11-12 DIAGNOSIS — Z794 Long term (current) use of insulin: Secondary | ICD-10-CM | POA: Diagnosis not present

## 2020-11-12 DIAGNOSIS — E118 Type 2 diabetes mellitus with unspecified complications: Secondary | ICD-10-CM | POA: Insufficient documentation

## 2020-11-12 NOTE — Progress Notes (Signed)
Diabetes Self-Management Education  Visit Type: First/Initial  Appt. Start Time: 1420 Appt. End Time: 7062  11/12/2020  [name], identified by name and date of birth, is a 75 y.o. male with a diagnosis of Diabetes: Type 2.   ASSESSMENT  There were no vitals taken for this visit. There is no height or weight on file to calculate BMI.  A1c up to 13.5% from 9.6% 2 months ago.   Patient states there have been a lot of family and friend deaths recently and states that is why he probably lost 13 lbs since November.  Wt Readings from Last 3 Encounters:  11/07/20 189 lb (85.7 kg)  08/22/20 202 lb 9.6 oz (91.9 kg)  07/11/20 197 lb (89.4 kg)    Medication: Novolog 70/30 flexpen Pt states 36 units in the morning and 26 units in the evening. Patient reports correct injection technique, however has been keeping used needles in a bag and throwing in the dumpster. Per chart Metformin intolerant (2015)  Patient states he uses Accu-chek meter 2x/day after he takes insulin: FBS and pre-dinner meal. Patient did not bring meter or log to visit, states fasting and before meals his blood sugar is 325 - 400 mg/dL. Pt states his goal is to get it back down to the 100s.  Pt states he is trying to eat right, and tries to eat vegetables every day (eats canned foods flip top, doesn't have can opener). Patient states he does not cook except for eggs. When getting food at home names cereal and sandwiches. Patient named Warden/ranger, Norman Herrlich and [employer] and states he tries to get healthier options. (onion rings instead of fries). Pt reports his brother helps him out by picking up food using patient's SNAP benefits card.    Diabetes Self-Management Education - 11/12/20 1434      Visit Information   Visit Type First/Initial      Initial Visit   Diabetes Type Type 2    Are you taking your medications as prescribed? Yes   glimipiride & novolog 70/30     Health Coping   How would you rate  your overall health? Good      Psychosocial Assessment   Patient Belief/Attitude about Diabetes Other (comment)   do the best I can with it   How often do you need to have someone help you when you read instructions, pamphlets, or other written materials from your doctor or pharmacy? 5 - Always    What is the last grade level you completed in school? 12      Complications   Last HgB A1C per patient/outside source 13.5 %    How often do you check your blood sugar? 1-2 times/day    Fasting Blood glucose range (mg/dL) >200   325-400   Have you had a dilated eye exam in the past 12 months? No    Have you had a dental exam in the past 12 months? No   has dentures   Are you checking your feet? Yes    How many days per week are you checking your feet? 7      Dietary Intake   Breakfast cheerios, 2% milk    Snack (morning) PB crackers    Lunch mayflower fish dinner    Snack (afternoon) bananas    Dinner [employer] chicken dinner OR bologna sandwiches OR canned vegetables    Beverage(s) water, 1-2 bottles zero sugar soda, cherry coke sometimes      Exercise  Exercise Type Light (walking / raking leaves)    How many days per week to you exercise? 7    How many minutes per day do you exercise? 20    Total minutes per week of exercise 140      Patient Education   Previous Diabetes Education Yes (please comment)   2021   Nutrition management  Role of diet in the treatment of diabetes and the relationship between the three main macronutrients and blood glucose level    Physical activity and exercise  Role of exercise on diabetes management, blood pressure control and cardiac health.    Medications Taught/reviewed insulin injection, site rotation, insulin storage and needle disposal.      Individualized Goals (developed by patient)   Nutrition General guidelines for healthy choices and portions discussed    Physical Activity Exercise 5-7 days per week      Outcomes   Expected Outcomes  Demonstrated interest in learning. Expect positive outcomes    Future DMSE 2 months    Program Status Completed           Individualized Plan for Diabetes Self-Management Training:   Learning Objective:  Patient will have a greater understanding of diabetes self-management. Patient education plan is to attend individual and/or group sessions per assessed needs and concerns.  Patient Instructions  Insulin: Start keeping your used needles in a hard plastic container before throwing in the trash.  Balanced eating: Use the handout provided to learn which foods belong in the carbohydrate category. Keep portions to 1/4 of your plate and include protein and non-starchy vegetables to your meals often.  Exercise: Continue walking in your neighborhood daily  Blood sugar log: consider keeping a written log or bring your meter to your health care visits.    Expected Outcomes:  Demonstrated interest in learning. Expect positive outcomes  Education material provided: Planning Healthy Meals  If problems or questions, patient to contact team via:  Phone  Future DSME appointment: 2 months

## 2020-11-12 NOTE — Patient Instructions (Signed)
Insulin: Start keeping your used needles in a hard plastic container before throwing in the trash.  Balanced eating: Use the handout provided to learn which foods belong in the carbohydrate category. Keep portions to 1/4 of your plate and include protein and non-starchy vegetables to your meals often.  Exercise: Continue walking in your neighborhood daily  Blood sugar log: consider keeping a written log or bring your meter to your health care visits.

## 2020-11-13 ENCOUNTER — Other Ambulatory Visit: Payer: Self-pay

## 2020-11-13 ENCOUNTER — Ambulatory Visit (INDEPENDENT_AMBULATORY_CARE_PROVIDER_SITE_OTHER): Payer: Medicare Other | Admitting: Endocrinology

## 2020-11-13 VITALS — BP 122/70 | HR 73 | Ht 71.5 in | Wt 187.4 lb

## 2020-11-13 DIAGNOSIS — Z794 Long term (current) use of insulin: Secondary | ICD-10-CM

## 2020-11-13 DIAGNOSIS — E118 Type 2 diabetes mellitus with unspecified complications: Secondary | ICD-10-CM

## 2020-11-13 DIAGNOSIS — M2012 Hallux valgus (acquired), left foot: Secondary | ICD-10-CM | POA: Diagnosis not present

## 2020-11-13 DIAGNOSIS — M2011 Hallux valgus (acquired), right foot: Secondary | ICD-10-CM | POA: Diagnosis not present

## 2020-11-13 DIAGNOSIS — E1165 Type 2 diabetes mellitus with hyperglycemia: Secondary | ICD-10-CM | POA: Diagnosis not present

## 2020-11-13 LAB — POCT GLUCOSE (DEVICE FOR HOME USE): POC Glucose: 138 mg/dl — AB (ref 70–99)

## 2020-11-13 LAB — POCT GLYCOSYLATED HEMOGLOBIN (HGB A1C)

## 2020-11-13 MED ORDER — DAPAGLIFLOZIN PROPANEDIOL 10 MG PO TABS: 10.0000 mg | ORAL_TABLET | Freq: Every day | ORAL | 1 refills | Status: DC

## 2020-11-13 NOTE — Progress Notes (Signed)
There is only 5:00 and looks like per the patient ID: Charles Parker, male   DOB: 08/26/1946, 75 y.o.   MRN: [id number]          Reason for Appointment: Follow-up for Type 2 Diabetes  Referring physician: Scarlette Calico   History of Present Illness:          Date of diagnosis of type 2 diabetes mellitus: 2014        Background history:   He has been on various oral hypoglycemic drugs since the onset including metformin, Januvia and Amaryl Was also on Invokana in 2015 for about a year but not clear why this was stopped His level of control has been quite variable with highest A1c 13.6 in 06/2015  He has been on insulin since about 10/2014 as documented in his record  Recent history:   INSULIN regimen is:  NovoLog mix 36 units before breakfast and 26 units before dinner  Non-insulin hypoglycemic drugs the patient is taking GNF:[id number] 10 mg daily  His A1c is markedly increased at 13.5 compared to 9.6   Current management, blood sugar patterns and problems identified:  He thinks he was out of insulin for couple of weeks and did not call here to get his refill  With this his blood sugars have been as high as 505 recently and 529 last month  At that point his insulin was restarted  He thinks he is taking his insulin twice a day as directed but has had readings over 450 twice in the last 5 days  He has checked his blood sugars very sporadically and not every day  Today his blood sugar was lower than usual at 181 fasting at home  He had not eaten his lunch and mid afternoon blood sugar today was 138  He is generally not consistently active, sometimes will walk when the weather is good  Recently appears to have lost some weight since his last visit   He thinks he is taking his Farxiga 10 mg in the morning recently but this is deleted from his medication list  Currently not complaining of increased thirst  Insulin injection technique was reviewed on the last visit   Side  effects from medications have been: Hives from metformin  Compliance with the medical regimen: Inconsistent  Glucose monitoring:  done< 1 times a   day usually        Glucometer: Accu-Chek guide       Blood Glucose readings and averages from meter download   PRE-MEAL  mornings Lunch Dinner Bedtime Overall  Glucose range:  181-298  271-318  356-505    Mean/median:   284   340   POST-MEAL PC Breakfast PC Lunch PC Dinner  Glucose range:   487  347  Mean/median:       Previously:  PRE-MEAL  morning Lunch Dinner Bedtime Overall  Glucose range:  150-327  276, 191  176-309  157  150-327  Mean/median:  185   245   208   POST-MEAL PC Breakfast PC Lunch PC Dinner  Glucose range:  ?   Mean/median:       Self-care: The diet that the patient has been following is: tries to limit [employer] .      Typical meal intake: Breakfast is eggs/cereal or oatmeal usually, lunch:  Lean cuisine or sandwiches.  Dinner chicken and vegetables.   Snacks will be chips, peanut butter crackers Supper at 6 pm  Dietician visit, most recent:3/19               Weight history:  Wt Readings from Last 3 Encounters:  11/13/20 187 lb 6.4 oz (85 kg)  11/07/20 189 lb (85.7 kg)  08/22/20 202 lb 9.6 oz (91.9 kg)    Glycemic control:   Lab Results  Component Value Date   HGBA1C 13.5 (A) 11/07/2020   HGBA1C 9.6 (H) 08/19/2020   HGBA1C 8.5 (H) 05/12/2020   Lab Results  Component Value Date   MICROALBUR <0.7 11/07/2020   LDLCALC 84 08/19/2020   CREATININE 1.10 11/07/2020   Lab Results  Component Value Date   MICRALBCREAT 1.4 11/07/2020    Lab Results  Component Value Date   FRUCTOSAMINE 660 (H) 10/28/2020   FRUCTOSAMINE 361 (H) 07/04/2020   FRUCTOSAMINE 337 (H) 04/30/2019      Allergies as of 11/13/2020      Reactions   Metformin And Related Diarrhea   Codeine Rash   All over the body      Medication List       Accurate as of November 13, 2020  3:06 PM. If you have any  questions, ask your nurse or doctor.        Accu-Chek Guide test strip Generic drug: glucose blood Use TID   glucose blood test strip Use Accu Chek Aviva Test Strips as instructed to check blood sugar three times daily.   Alcohol Swabs Pads Use to clean area before insulin injections. DX E11.8   B-D UF III MINI PEN NEEDLES 31G X 5 MM Misc Generic drug: Insulin Pen Needle ADMINISTER 30 UNITS EVERY EVENING   Cholecalciferol 50 MCG (2000 UT) Tabs Take 1 tablet (2,000 Units total) by mouth daily.   Cholecalciferol 50 MCG (2000 UT) Tabs Take 1 tablet (2,000 Units total) by mouth daily.   ferrous sulfate 325 (65 FE) MG tablet Take 1 tablet (325 mg total) by mouth 2 (two) times daily with a meal.   fluconazole 100 MG tablet Commonly known as: DIFLUCAN Take 1 tablet (100 mg total) by mouth daily for 7 days.   FreeStyle Libre 2 Reader [place] 1 Act by Does not apply route daily.   FreeStyle Libre 2 Sensor Misc 1 Act by Does not apply route daily.   gabapentin 300 MG capsule Commonly known as: NEURONTIN TAKE 1 CAPSULE(300 MG) BY MOUTH TWICE DAILY   glimepiride 4 MG tablet Commonly known as: AMARYL Take 1 tablet (4 mg total) by mouth daily before breakfast.   Gvoke HypoPen 2-Pack 1 MG/0.2ML Soaj Generic drug: Glucagon Inject 1 Act into the skin daily as needed.   insulin lispro protamine-lispro (75-25) 100 UNIT/ML Susp injection Commonly known as: HUMALOG 75/25 MIX Inject 26 Units into the skin 2 (two) times daily with a meal. Inject 26 units at breakfast and dinner   multivitamin with minerals Tabs tablet Take 1 tablet by mouth daily.   NovoLOG Mix 70/30 FlexPen (70-30) 100 UNIT/ML FlexPen Generic drug: insulin aspart protamine - aspart Inject 26 units under the skin breakfast and at dinner.   OneTouch Verio Flex System w/Device Kit 1 each by Does not apply route 3 (three) times daily. Use Onetouch Verio flex to check blood sugar three times daily.   oxybutynin 15 MG  24 hr tablet Commonly known as: DITROPAN XL Take 1 tablet (15 mg total) by mouth at bedtime.   rivaroxaban 10 MG Tabs tablet Commonly known as: Xarelto Take 1 tablet (10 mg total) by mouth  daily.   simvastatin 40 MG tablet Commonly known as: ZOCOR TAKE 1 TABLET(40 MG) BY MOUTH AT BEDTIME   tamsulosin 0.4 MG Caps capsule Commonly known as: FLOMAX TAKE 1 CAPSULE(0.4 MG) BY MOUTH DAILY AFTER BREAKFAST   TRUEplus Lancets 33G Misc USE TO CHECK BLOOD SUGARS THREE TIMES DAY   OneTouch Delica Lancets 20N Misc 1 each by Does not apply route 3 (three) times daily. Use Onetouch delica lancets to check blood sugar three times daily.       Allergies:  Allergies  Allergen Reactions  . Metformin And Related Diarrhea  . Codeine Rash    All over the body    Past Medical History:  Diagnosis Date  . Clotting disorder (HCC)    DVT  . Diabetes mellitus    type II  . Diverticulosis   . DVT (deep venous thrombosis) ([place])   . Glaucoma    no eye drops   . Hepatic cyst   . Hypercholesterolemia   . Hypertension   . Renal cyst   . Sleep apnea   . Tubulovillous adenoma     Past Surgical History:  Procedure Laterality Date  . BLADDER NECK RECONSTRUCTION  02/03/2012   Procedure: BLADDER NECK REPAIR;  Surgeon: Adin Hector, MD;  Location: [facility] ORS;  Service: General;;  . COLONOSCOPY  02/2013   polyp removal(mult.)  . KNEE SURGERY  2004   left  . POLYPECTOMY    . PROCTOSCOPY  02/03/2012   Procedure: PROCTOSCOPY;  Surgeon: Adin Hector, MD;  Location: [facility];  Service: General;;  . STOMACH SURGERY  02/2012  . TEE WITHOUT CARDIOVERSION N/A 10/05/2016   Procedure: TRANSESOPHAGEAL ECHOCARDIOGRAM (TEE);  Surgeon: Sanda Klein, MD;  Location: [facility] ENDOSCOPY;  Service: Cardiovascular;  Laterality: N/A;    Family History  Problem Relation Age of Onset  . Hypertension Mother   . Diabetes Mother   . Cancer Mother        ? location  . Colon cancer Father 10  . Colon polyps Neg Hx   . Liver  cancer Neg Hx     Social History:  reports that he has never smoked. He has never used smokeless tobacco. He reports that he does not drink alcohol and does not use drugs.   Review of Systems   Lipid history: Has been taking Zocor 40 mg with variable control, followed by PCP    Lab Results  Component Value Date   CHOL 153 08/19/2020   CHOL 202 (H) 05/12/2020   CHOL 195 03/05/2019   Lab Results  Component Value Date   HDL 43.00 08/19/2020   HDL 50.80 05/12/2020   HDL 46.40 03/05/2019   Lab Results  Component Value Date   LDLCALC 84 08/19/2020   LDLCALC 124 (H) 05/12/2020   LDLCALC 119 (H) 03/05/2019   Lab Results  Component Value Date   TRIG 130.0 08/19/2020   TRIG 134.0 05/12/2020   TRIG 152.0 (H) 03/05/2019   Lab Results  Component Value Date   CHOLHDL 4 08/19/2020   CHOLHDL 4 05/12/2020   CHOLHDL 4 03/05/2019   Lab Results  Component Value Date   LDLDIRECT 115.0 07/02/2019   LDLDIRECT 118.0 11/11/2015   LDLDIRECT 201.0 03/04/2015           He has history of neuropathy taking gabapentin prescribed by PCP  Most recent foot exam: 2/22  RENAL function is consistently normal with using Farxiga 10 mg  Lab Results  Component Value Date   CREATININE  1.10 11/07/2020   CREATININE 1.27 10/28/2020   CREATININE 1.18 08/19/2020    Blood pressure generally controlled without any medications  BP Readings from Last 3 Encounters:  11/13/20 122/70  11/07/20 134/84  08/22/20 122/82    Has had all 3 Covid shots   Physical Examination:  BP 122/70 (BP Location: Right Arm, Patient Position: Sitting, Cuff Size: Large)   Pulse 73   Ht 5' 11.5" (1.816 m)   Wt 187 lb 6.4 oz (85 kg)   SpO2 96%   BMI 25.77 kg/m   Diabetic Foot Exam - Simple   Simple Foot Form Visual Inspection See comments: Yes Sensation Testing Intact to touch and monofilament testing bilaterally: Yes Pulse Check Posterior Tibialis and Dorsalis pulse intact bilaterally:  Yes Comments Marked hallux valgus both feet.  Also mild bunion formation on the first toes laterally Mild hammertoes    No ankle edema    ASSESSMENT:  Diabetes type 2 on insulin  See history of present illness for detailed discussion of current diabetes management, blood sugar patterns and problems identified   He is on a regimen of premixed insulin twice a day and Farxiga  His A1c is significantly high at 13.5  Although he has been out of insulin for reportedly 2 weeks and likely is taking his insulin regularly with A1c progressively going up since last May Also appears to be generally requiring more insulin Unclear whether he is taking [place] or not since it has been deleted from his medication list  He is checking his blood sugars very sporadically and not consistently after meals also  Hallux valgus and foot deformities: Diabetic shoes will be approved  PLAN:   Increase insulin to 40 units in the morning and 30 at suppertime Reminded him about timing of insulin injections Reminded him not to skip any meals To have protein with every meal including breakfast Detailed instructions written for insulin doses, glucose monitoring and blood sugar targets He will make sure he has [place] at home otherwise call for new prescription Likely needs more diabetes education and is currently scheduled to see the dietitian in April   May have diabetic shoes for his foot deformities  There are no Patient Instructions on file for this visit.     Elayne Snare 11/13/2020, 3:06 PM   Note: This office note was prepared with Dragon voice recognition system technology. Any transcriptional errors that result from this process are unintentional.

## 2020-11-13 NOTE — Patient Instructions (Signed)
Insulin 40 units in am, 30 at supper Farxiga 10mg  in am daily  Check blood sugars on waking up 3-4 days a week  Also check blood sugars about 2 hours after meals and do this after different meals by rotation  Recommended blood sugar levels on waking up are 90-130 and about 2 hours after meal is 130-180  Please bring your blood sugar monitor to each visit, thank you

## 2020-11-17 ENCOUNTER — Telehealth: Payer: Self-pay

## 2020-11-17 ENCOUNTER — Other Ambulatory Visit: Payer: Self-pay

## 2020-11-17 ENCOUNTER — Ambulatory Visit (INDEPENDENT_AMBULATORY_CARE_PROVIDER_SITE_OTHER): Payer: Medicare Other | Admitting: Pharmacist

## 2020-11-17 DIAGNOSIS — E1165 Type 2 diabetes mellitus with hyperglycemia: Secondary | ICD-10-CM

## 2020-11-17 DIAGNOSIS — I10 Essential (primary) hypertension: Secondary | ICD-10-CM | POA: Diagnosis not present

## 2020-11-17 DIAGNOSIS — Z794 Long term (current) use of insulin: Secondary | ICD-10-CM | POA: Diagnosis not present

## 2020-11-17 DIAGNOSIS — D6859 Other primary thrombophilia: Secondary | ICD-10-CM

## 2020-11-17 DIAGNOSIS — E785 Hyperlipidemia, unspecified: Secondary | ICD-10-CM | POA: Diagnosis not present

## 2020-11-17 MED ORDER — SIMVASTATIN 40 MG PO TABS: ORAL_TABLET | ORAL | 1 refills | Status: DC

## 2020-11-17 NOTE — Telephone Encounter (Signed)
-----   Message from Charlton Haws, [facility] sent at 11/17/2020 11:13 AM EST ----- Regarding: Med refill Hey, Mr Eckstein is out of simvastatin, can you refill this to [facility] for him please? He just saw Dr [name] on 11/07/20.

## 2020-11-17 NOTE — Progress Notes (Signed)
Chronic Care Management Pharmacy Note  11/17/2020 Name:  Charles Parker MRN:  [id number] DOB:  20-Jan-1946  Subjective: Charles Parker is an 75 y.o. year old male who is a primary patient of Janith Lima, MD.  The CCM team was consulted for assistance with disease management and care coordination needs.    Engaged with patient by telephone for initial visit in response to provider referral for pharmacy case management and/or care coordination services.   Consent to Services:  The patient was given the following information about Chronic Care Management services today, agreed to services, and gave verbal consent: 1. CCM service includes personalized support from designated clinical staff supervised by the primary care provider, including individualized plan of care and coordination with other care providers 2. 24/7 contact phone numbers for assistance for urgent and routine care needs. 3. Service will only be billed when office clinical staff spend 20 minutes or more in a month to coordinate care. 4. Only one practitioner may furnish and bill the service in a calendar month. 5.The patient may stop CCM services at any time (effective at the end of the month) by phone call to the office staff. 6. The patient will be responsible for cost sharing (co-pay) of up to 20% of the service fee (after annual deductible is met). Patient agreed to services and consent obtained.  Patient Care Team: Janith Lima, MD as PCP - General (Internal Medicine) Wilford Corner, MD as Consulting Physician (Gastroenterology) Philemon Kingdom, MD as Consulting Physician (Internal Medicine) Gardiner Barefoot, DPM as Consulting Physician (Podiatry) Pleasant, Eppie Gibson, RN as [place] Management Charlton Haws, [facility] as Pharmacist (Pharmacist)   Patient lives at home alone. His wife has passed. He is working on improving blood sugars by walking, watching what he eats and taking medicines as  prescribed.   Recent office visits: 11/07/20 Dr [name] OV: CPX, uncontrolled BG. A1c 13.5%. Rec'd against SGLT2 due to high A1c, dc'd [place]. Started glimepiride 4 mg, Freestyle Libre, [place], Vitamin D. Referred to nutrition.  Recent consult visits: 11/13/20 Dr Dwyane Dee (endocrine): increase insulin to 40 units AM and 30 units PM. Resume [place].  11/12/20 RD Levie Heritage (nutrition consult)  Hospital visits: None in previous 6 months  Objective:  Lab Results  Component Value Date   CREATININE 1.10 11/07/2020   BUN 13 11/07/2020   GFR 66.04 11/07/2020   GFRNONAA 87 12/23/2016   GFRAA >89 12/23/2016   NA 134 (L) 11/07/2020   K 5.3 No hemolysis seen (H) 11/07/2020   CALCIUM 9.7 11/07/2020   CO2 29 11/07/2020    Lab Results  Component Value Date/Time   HGBA1C 13.5 (A) 11/07/2020 01:40 PM   HGBA1C 9.6 (H) 08/19/2020 09:25 AM   HGBA1C 8.5 (H) 05/12/2020 12:11 PM   FRUCTOSAMINE 660 (H) 10/28/2020 08:47 AM   FRUCTOSAMINE 361 (H) 07/04/2020 01:42 PM   GFR 66.04 11/07/2020 12:08 PM   GFR 55.59 (L) 10/28/2020 08:47 AM   MICROALBUR <0.7 11/07/2020 12:08 PM   MICROALBUR <0.7 02/04/2020 02:28 PM    Last diabetic Eye exam:  Lab Results  Component Value Date/Time   HMDIABEYEEXA No Retinopathy 04/10/2019 12:00 AM    Last diabetic Foot exam:  Lab Results  Component Value Date/Time   HMDIABFOOTEX No neuropathy 07/09/2020 12:00 AM     Lab Results  Component Value Date   CHOL 153 08/19/2020   HDL 43.00 08/19/2020   LDLCALC 84 08/19/2020   LDLDIRECT 115.0 07/02/2019   TRIG  130.0 08/19/2020   CHOLHDL 4 08/19/2020    Hepatic Function Latest Ref Rng & Units 08/19/2020 05/12/2020 05/12/2020  Total Protein 6.0 - 8.3 g/dL 7.0 - 7.5  Albumin 3.5 - 5.2 g/dL 4.1 4.4 4.4  AST 0 - 37 U/L 18 - 18  ALT 0 - 53 U/L 18 - 23  Alk Phosphatase 39 - 117 U/L 58 - 61  Total Bilirubin 0.2 - 1.2 mg/dL 0.7 - 0.4    Lab Results  Component Value Date/Time   TSH 1.55 11/07/2020 12:08 PM   TSH 0.67  09/08/2016 03:13 PM    CBC Latest Ref Rng & Units 11/07/2020 09/24/2019 03/29/2019  WBC 4.0 - 10.5 K/uL 3.9(L) 4.3 4.0  Hemoglobin 13.0 - 17.0 g/dL 15.0 16.2 15.8  Hematocrit 39.0 - 52.0 % 44.3 49.3 46.9  Platelets 150.0 - 400.0 K/uL 134.0(L) 176.0 154.0    Lab Results  Component Value Date/Time   VD25OH 21.10 (L) 11/07/2020 12:08 PM   VD25OH 37.61 03/29/2019 02:48 PM    Clinical ASCVD: No  The 10-year ASCVD risk score Mikey Bussing DC Jr., et al., 2013) is: 21.5%   Values used to calculate the score:     Age: 55 years     Sex: Male     Is Non-Hispanic African American: Yes     Diabetic: Yes     Tobacco smoker: No     Systolic Blood Pressure: 664 mmHg     Is BP treated: No     HDL Cholesterol: 43 mg/dL     Total Cholesterol: 153 mg/dL    Depression screen [facility] 2/9 11/12/2020 12/12/2019 06/19/2019  Decreased Interest 0 0 0  Down, Depressed, Hopeless 0 0 0  PHQ - 2 Score 0 0 0  Altered sleeping - - -  Tired, decreased energy - - -  Change in appetite - - -  Feeling bad or failure about yourself  - - -  Trouble concentrating - - -  Moving slowly or fidgety/restless - - -  Suicidal thoughts - - -  PHQ-9 Score - - -  Difficult doing work/chores - - -  Some recent data might be hidden     Social History   Tobacco Use  Smoking Status Never Smoker  Smokeless Tobacco Never Used   BP Readings from Last 3 Encounters:  11/13/20 122/70  11/07/20 134/84  08/22/20 122/82   Pulse Readings from Last 3 Encounters:  11/13/20 73  11/07/20 88  08/22/20 65   Wt Readings from Last 3 Encounters:  11/13/20 187 lb 6.4 oz (85 kg)  11/07/20 189 lb (85.7 kg)  08/22/20 202 lb 9.6 oz (91.9 kg)    Assessment/Interventions: Review of patient past medical history, allergies, medications, health status, including review of consultants reports, laboratory and other test data, was performed as part of comprehensive evaluation and provision of chronic care management services.   SDOH:  (Social  Determinants of Health) assessments and interventions performed: Yes SDOH Interventions   Flowsheet Row Most Recent Value  SDOH Interventions   Financial Strain Interventions Intervention Not Indicated      CCM Care Plan  Allergies  Allergen Reactions  . Metformin And Related Diarrhea  . Codeine Rash    All over the body    Medications Reviewed Today    Reviewed by Charlton Haws, [facility] (Pharmacist) on 11/17/20 at [id number]  Med List Status: <None>  Medication Order Taking? Sig Documenting Provider Last Dose Status Informant  Alcohol Swabs PADS [id number] Yes Use  to clean area before insulin injections. DX E11.8 Elayne Snare, MD Taking Active   blood glucose meter kit and supplies KIT [id number] Yes 1 each by Does not apply route daily as needed. Accu-Chek Aviva preferred by patient. Use up to four times daily as directed. (FOR ICD-9 250.00, 250.01). [provider] Taking Active   Cholecalciferol 50 MCG (2000 UT) TABS [id number] Yes Take 1 tablet (2,000 Units total) by mouth daily. Janith Lima, MD Taking Active   dapagliflozin propanediol (FARXIGA) 10 MG TABS tablet [id number] Yes Take 1 tablet (10 mg total) by mouth daily. Elayne Snare, MD Taking Active   gabapentin (NEURONTIN) 300 MG capsule [id number] Yes TAKE 1 CAPSULE(300 MG) BY MOUTH TWICE DAILY Elayne Snare, MD Taking Active   glimepiride (AMARYL) 4 MG tablet [id number] Yes Take 1 tablet (4 mg total) by mouth daily before breakfast. Janith Lima, MD Taking Active   Glucagon (GVOKE HYPOPEN 2-PACK) 1 MG/0.2ML SOAJ [id number] No Inject 1 Act into the skin daily as needed.  Patient not taking: Reported on 11/17/2020   Janith Lima, MD Not Taking Active   glucose blood test strip [id number] Yes Use Accu Chek Aviva Test Strips as instructed to check blood sugar three times daily. Elayne Snare, MD Taking Active   insulin aspart protamine - aspart (NOVOLOG MIX 70/30 FLEXPEN) (70-30) 100 UNIT/ML FlexPen [id number] Yes Inject 26 units  under the skin breakfast and at dinner.  Patient taking differently: Inject 26 units under the skin breakfast and at dinner.   Elayne Snare, MD Taking Active   Insulin Pen Needle (B-D UF III MINI PEN NEEDLES) 31G X 5 MM MISC [id number] Yes ADMINISTER 30 UNITS EVERY Sharol Given, MD Taking Active   Multiple Vitamin (MULTIVITAMIN WITH MINERALS) TABS tablet [id number] Yes Take 1 tablet by mouth daily. [provider] Taking Active   rivaroxaban (XARELTO) 10 MG TABS tablet [id number] Yes Take 1 tablet (10 mg total) by mouth daily. Janith Lima, MD Taking Active   simvastatin (ZOCOR) 40 MG tablet [id number] Yes TAKE 1 TABLET(40 MG) BY MOUTH AT BEDTIME Janith Lima, MD Taking Active           Patient Active Problem List   Diagnosis Date Noted  . Encounter for general adult medical examination with abnormal findings 11/07/2020  . Elevated factor VIII level 10/08/2019  . Candidal balanitis 03/29/2019  . Erectile dysfunction due to diabetes mellitus ([place]) 03/29/2019  . Essential hypertension 05/18/2018  . Vitamin D insufficiency 05/18/2018  . Hallux rigidus, right foot 02/14/2017  . Degenerative arthritis of left knee 01/11/2017  . OAB (overactive bladder) 01/09/2017  . Medication monitoring encounter 12/23/2016  . Iron deficiency 12/16/2016  . Type 2 diabetes mellitus with complication, with long-term current use of insulin ([place]) 12/15/2016  . Hyperlipidemia with target LDL less than 70 10/21/2016  . Thoracic spine fracture ([place]) 10/21/2016  . Benign prostatic hyperplasia (BPH) with straining on urination 06/20/2013  . 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 12/27/2011    Immunization History  Administered Date(s) Administered  . Fluad Quad(high Dose 65+) 07/17/2019, 07/08/2020  . Influenza, High Dose Seasonal PF 06/13/2017, 07/06/2018  . Influenza,inj,Quad PF,6+ Mos 06/20/2013, 07/10/2014  .  Influenza-Unspecified 06/05/2015, 07/26/2016  . PFIZER(Purple Top)SARS-COV-2 Vaccination 10/26/2019, 11/16/2019, 07/19/2020  . PPD Test 10/29/2016  . Pneumococcal Conjugate-13 06/20/2013, 07/23/2014  . Pneumococcal Polysaccharide-23 06/19/2015  . Tdap 06/20/2013  . Zoster Recombinat (Shingrix) 07/17/2019    Conditions  to be addressed/monitored:  Hypertension, Hyperlipidemia, Diabetes, Osteoarthritis and Hypercoaguable state  Care Plan : [place]  Updates made by Charlton Haws, [place] since 11/17/2020 12:00 AM    Problem: Hypertension, Hyperlipidemia, Diabetes, Osteoarthritis and Hypercoaguable state   Priority: High    Long-Range Goal: Disease management   Start Date: 11/17/2020  Expected End Date: 05/17/2021  This Visit's Progress: On track  Priority: High  Note:   Current Barriers:  . Unable to independently monitor therapeutic efficacy . Unable to achieve control of diabetes  . Unable to self administer medications as prescribed  Pharmacist Clinical Goal(s):  Marland Kitchen Over the next 90 days, patient will achieve adherence to monitoring guidelines and medication adherence to achieve therapeutic efficacy . achieve control of diabetes as evidenced by improvement in A1c . achieve ability to self administer medications as prescribed through use of insulin as evidenced by patient report through collaboration with PharmD and provider.   Interventions: . 1:1 collaboration with Janith Lima, MD regarding development and update of comprehensive plan of care as evidenced by provider attestation and co-signature . Inter-disciplinary care team collaboration (see longitudinal plan of care) . Comprehensive medication review performed; medication list updated in electronic medical record  Hypertension (BP goal <130/80) -controlled -Current treatment: . No medications -Medications previously tried: Lexicographer, Bystolic, losartan-HCTZ, valsartan -Denies hypotensive/hypertensive  symptoms -Educated on Daily salt intake goal < 2300 mg; Exercise goal of 150 minutes per week; -Counseled to monitor BP at home weekly, document, and provide log at future appointments -Counseled on diet and exercise extensively  Hyperlipidemia: (LDL goal < 100) -controlled - however pt is out of simvastatin and has not renewed it for several weeks -Current treatment: . Simvastatin 40 mg daily HS -Medications previously tried: ezetimibe -Educated on Cholesterol goals;  Benefits of statin for ASCVD risk reduction; -Recommended to continue current medication Collaborated with PCP to refill simvastatin  Diabetes (A1c goal <8%) -uncontrolled - but improving. Pt reports compliance with meds as prescribed and working on lifestyle modifications. -Dx 2014. Insulin since 2016. -Current medications: . Glimepiride 4 mg daily  . Farxiga 10 mg daily . Novolog 70/30 36 units AM 26 units PM . Gvoke Hypopen . Accu-Chek glucometer -Medications previously tried: metformin (hives), Lantus, Januvia, Invokana -Current home glucose readings . fasting glucose: 96 . post prandial glucose: 500 -Reports hypoglycemic symptoms - over last week he has felt "off" and BG was < 90, he ate piece of candy and felt better -Current meal patterns: seen nutritionist, trying to "lay off the sweets" -Current exercise: walking most days -Educated onA1c and blood sugar goals; Proper insulin injection technique; Prevention and management of hypoglycemic episodes; Benefits of routine self-monitoring of blood sugar; -Counseled to check feet daily and get yearly eye exams -Recommended to continue current medication Assessed patient finances. He denies issues paying for insulin or Farxiga  Hypercoaguable state/Hx DVT (Goal: prevent subsequent DVT) -controlled - pt will be on Xarelto indefinitely due to Factor VIII elevation -Current treatment  . Xarelto 10 mg daily -Recommended to continue current  medication  Osteoarthritis/ hx spine fracture (Goal: manage pain) -controlled - per patient report -Current treatment   . Gabapentin 300 mg BID -Recommended to continue current medication  Health Maintenance -Vaccine gaps: none -Current therapy: Marland Kitchen Vitamin D 2000 IU daily . Multivitamin -Patient is satisfied with current therapy and denies issues -Recommended to continue current medication Counseled on benefits of Vitamin D given deficiency   Patient Goals/Self-Care Activities . Over the next 90  days, patient will:  - take medications as prescribed focus on medication adherence by establishing routine check glucose 2-4 times daily, document, and provide at future appointments target a minimum of 150 minutes of moderate intensity exercise weekly engage in dietary modifications by following nutritionist's recommendations  Follow Up Plan: Telephone follow up appointment with care management team member scheduled for: 3 months      Medication Assistance: None required.  Patient affirms current coverage meets needs.  Patient's preferred pharmacy is:  [facility] [place], [address] AT [address] [place] [address] Phone: [phone] Fax: [phone]  Uses pill box? No - prefers bottles Pt endorses 90% compliance - forgets to refill meds sometimes  We discussed: Current pharmacy is preferred with insurance plan and patient is satisfied with pharmacy services Patient decided to: Continue current medication management strategy  Care Plan and Follow Up Patient Decision:  Patient agrees to Care Plan and Follow-up.  Plan: Telephone follow up appointment with care management team member scheduled for:  3 months  Charlene Brooke, PharmD, [facility] Clinical Pharmacist [facility] [phone]

## 2020-11-17 NOTE — Patient Instructions (Addendum)
Visit Information  Phone number for Pharmacist: [phone]  Thank you for meeting with me to discuss your medications! I look forward to working with you to achieve your health care goals. Below is a summary of what we talked about during the visit:     Patient Care Plan: CCM Pharmacy Care Plan    Problem Identified: Hypertension, Hyperlipidemia, Diabetes, Osteoarthritis and Hypercoaguable state   Priority: High    Long-Range Goal: Disease management   Start Date: 11/17/2020  Expected End Date: 05/17/2021  This Visit's Progress: On track  Priority: High  Note:   Current Barriers:  . Unable to independently monitor therapeutic efficacy . Unable to achieve control of diabetes  . Unable to self administer medications as prescribed  Pharmacist Clinical Goal(s):  Marland Kitchen Over the next 90 days, patient will achieve adherence to monitoring guidelines and medication adherence to achieve therapeutic efficacy . achieve control of diabetes as evidenced by improvement in A1c . achieve ability to self administer medications as prescribed through use of insulin as evidenced by patient report through collaboration with PharmD and provider.   Interventions: . 1:1 collaboration with Janith Lima, MD regarding development and update of comprehensive plan of care as evidenced by provider attestation and co-signature . Inter-disciplinary care team collaboration (see longitudinal plan of care) . Comprehensive medication review performed; medication list updated in electronic medical record  Hypertension (BP goal <130/80) -controlled -Current treatment: . No medications -Medications previously tried: Lexicographer, Bystolic, losartan-HCTZ, valsartan -Denies hypotensive/hypertensive symptoms -Educated on Daily salt intake goal < 2300 mg; Exercise goal of 150 minutes per week; -Counseled to monitor BP at home weekly, document, and provide log at future appointments -Counseled on diet and exercise  extensively  Hyperlipidemia: (LDL goal < 100) -controlled - however pt is out of simvastatin and has not renewed it for several weeks -Current treatment: . Simvastatin 40 mg daily HS -Medications previously tried: ezetimibe -Educated on Cholesterol goals;  Benefits of statin for ASCVD risk reduction; -Recommended to continue current medication Collaborated with PCP to refill simvastatin  Diabetes (A1c goal <8%) -uncontrolled - but improving. Pt reports compliance with meds as prescribed and working on lifestyle modifications. -Dx 2014. Insulin since 2016. -Current medications: . Glimepiride 4 mg daily  . Farxiga 10 mg daily . Novolog 70/30 36 units AM 26 units PM . Gvoke Hypopen . Accu-Chek glucometer -Medications previously tried: metformin (hives), Lantus, Januvia, Invokana -Current home glucose readings . fasting glucose: 96 . post prandial glucose: 500 -Reports hypoglycemic symptoms - over last week he has felt "off" and BG was < 90, he ate piece of candy and felt better -Current meal patterns: seen nutritionist, trying to "lay off the sweets" -Current exercise: walking most days -Educated onA1c and blood sugar goals; Proper insulin injection technique; Prevention and management of hypoglycemic episodes; Benefits of routine self-monitoring of blood sugar; -Counseled to check feet daily and get yearly eye exams -Recommended to continue current medication Assessed patient finances. He denies issues paying for insulin or Farxiga  Hypercoaguable state/Hx DVT (Goal: prevent subsequent DVT) -controlled - pt will be on Xarelto indefinitely due to Factor VIII elevation -Current treatment  . Xarelto 10 mg daily -Recommended to continue current medication  Osteoarthritis/ hx spine fracture (Goal: manage pain) -controlled - per patient report -Current treatment   . Gabapentin 300 mg BID -Recommended to continue current medication  Health Maintenance -Vaccine gaps:  none -Current therapy: Marland Kitchen Vitamin D 2000 IU daily . Multivitamin -Patient is satisfied with current therapy and  denies issues -Recommended to continue current medication Counseled on benefits of Vitamin D given deficiency   Patient Goals/Self-Care Activities . Over the next 90 days, patient will:  - take medications as prescribed focus on medication adherence by establishing routine check glucose 2-4 times daily, document, and provide at future appointments target a minimum of 150 minutes of moderate intensity exercise weekly engage in dietary modifications by following nutritionist's recommendations  Follow Up Plan: Telephone follow up appointment with care management team member scheduled for: 3 months      Mr. Rann was given information about Chronic Care Management services today including:  1. CCM service includes personalized support from designated clinical staff supervised by his physician, including individualized plan of care and coordination with other care providers 2. 24/7 contact phone numbers for assistance for urgent and routine care needs. 3. Standard insurance, coinsurance, copays and deductibles apply for chronic care management only during months in which we provide at least 20 minutes of these services. Most insurances cover these services at 100%, however patients may be responsible for any copay, coinsurance and/or deductible if applicable. This service may help you avoid the need for more expensive face-to-face services. 4. Only one practitioner may furnish and bill the service in a calendar month. 5. The patient may stop CCM services at any time (effective at the end of the month) by phone call to the office staff.  Patient agreed to services and verbal consent obtained.   The patient verbalized understanding of instructions, educational materials, and care plan provided today and agreed to receive a mailed copy of patient instructions, educational materials,  and care plan.  Telephone follow up appointment with pharmacy team member scheduled for: 3 months  Charlene Brooke, PharmD, BCACP Clinical Pharmacist [facility] [phone]  Diabetes Mellitus and Nutrition, Adult When you have diabetes, or diabetes mellitus, it is very important to have healthy eating habits because your blood sugar (glucose) levels are greatly affected by what you eat and drink. Eating healthy foods in the right amounts, at about the same times every day, can help you:  Control your blood glucose.  Lower your risk of heart disease.  Improve your blood pressure.  Reach or maintain a healthy weight. What can affect my meal plan? Every person with diabetes is different, and each person has different needs for a meal plan. Your health care provider may recommend that you work with a dietitian to make a meal plan that is best for you. Your meal plan may vary depending on factors such as:  The calories you need.  The medicines you take.  Your weight.  Your blood glucose, blood pressure, and cholesterol levels.  Your activity level.  Other health conditions you have, such as heart or kidney disease. How do carbohydrates affect me? Carbohydrates, also called carbs, affect your blood glucose level more than any other type of food. Eating carbs naturally raises the amount of glucose in your blood. Carb counting is a method for keeping track of how many carbs you eat. Counting carbs is important to keep your blood glucose at a healthy level, especially if you use insulin or take certain oral diabetes medicines. It is important to know how many carbs you can safely have in each meal. This is different for every person. Your dietitian can help you calculate how many carbs you should have at each meal and for each snack. How does alcohol affect me? Alcohol can cause a sudden decrease in  blood glucose (hypoglycemia), especially if you use insulin or  take certain oral diabetes medicines. Hypoglycemia can be a life-threatening condition. Symptoms of hypoglycemia, such as sleepiness, dizziness, and confusion, are similar to symptoms of having too much alcohol.  Do not drink alcohol if: ? Your health care provider tells you not to drink. ? You are pregnant, may be pregnant, or are planning to become pregnant.  If you drink alcohol: ? Do not drink on an empty stomach. ? Limit how much you use to:  0-1 drink a day for women.  0-2 drinks a day for men. ? Be aware of how much alcohol is in your drink. In the [place] one drink equals one 12 oz bottle of beer (355 mL), one 5 oz glass of wine (148 mL), or one 1 oz glass of hard liquor (44 mL). ? Keep yourself hydrated with water, diet soda, or unsweetened iced tea.  Keep in mind that regular soda, juice, and other mixers may contain a lot of sugar and must be counted as carbs. What are tips for following this plan? Reading food labels  Start by checking the serving size on the "Nutrition Facts" label of packaged foods and drinks. The amount of calories, carbs, fats, and other nutrients listed on the label is based on one serving of the item. Many items contain more than one serving per package.  Check the total grams (g) of carbs in one serving. You can calculate the number of servings of carbs in one serving by dividing the total carbs by 15. For example, if a food has 30 g of total carbs per serving, it would be equal to 2 servings of carbs.  Check the number of grams (g) of saturated fats and trans fats in one serving. Choose foods that have a low amount or none of these fats.  Check the number of milligrams (mg) of salt (sodium) in one serving. Most people should limit total sodium intake to less than 2,300 mg per day.  Always check the nutrition information of foods labeled as "low-fat" or "nonfat." These foods may be higher in added sugar or refined carbs and should be avoided.  Talk to  your dietitian to identify your daily goals for nutrients listed on the label. Shopping  Avoid buying canned, pre-made, or processed foods. These foods tend to be high in fat, sodium, and added sugar.  Shop around the outside edge of the grocery store. This is where you will most often find fresh fruits and vegetables, bulk grains, fresh meats, and fresh dairy. Cooking  Use low-heat cooking methods, such as baking, instead of high-heat cooking methods like deep frying.  Cook using healthy oils, such as olive, canola, or sunflower oil.  Avoid cooking with butter, cream, or high-fat meats. Meal planning  Eat meals and snacks regularly, preferably at the same times every day. Avoid going long periods of time without eating.  Eat foods that are high in fiber, such as fresh fruits, vegetables, beans, and whole grains. Talk with your dietitian about how many servings of carbs you can eat at each meal.  Eat 4-6 oz (112-168 g) of lean protein each day, such as lean meat, chicken, fish, eggs, or tofu. One ounce (oz) of lean protein is equal to: ? 1 oz (28 g) of meat, chicken, or fish. ? 1 egg. ?  cup (62 g) of tofu.  Eat some foods each day that contain healthy fats, such as avocado, nuts, seeds, and fish.  What foods should I eat? Fruits Berries. Apples. Oranges. Peaches. Apricots. Plums. Grapes. Mango. Papaya. Pomegranate. Kiwi. Cherries. Vegetables Lettuce. Spinach. Leafy greens, including kale, chard, collard greens, and mustard greens. Beets. Cauliflower. Cabbage. Broccoli. Carrots. Green beans. Tomatoes. Peppers. Onions. Cucumbers. Brussels sprouts. Grains Whole grains, such as whole-wheat or whole-grain bread, crackers, tortillas, cereal, and pasta. Unsweetened oatmeal. Quinoa. Brown or wild rice. Meats and other proteins Seafood. Poultry without skin. Lean cuts of poultry and beef. Tofu. Nuts. Seeds. Dairy Low-fat or fat-free dairy products such as milk, yogurt, and cheese. The  items listed above may not be a complete list of foods and beverages you can eat. Contact a dietitian for more information. What foods should I avoid? Fruits Fruits canned with syrup. Vegetables Canned vegetables. Frozen vegetables with butter or cream sauce. Grains Refined white flour and flour products such as bread, pasta, snack foods, and cereals. Avoid all processed foods. Meats and other proteins Fatty cuts of meat. Poultry with skin. Breaded or fried meats. Processed meat. Avoid saturated fats. Dairy Full-fat yogurt, cheese, or milk. Beverages Sweetened drinks, such as soda or iced tea. The items listed above may not be a complete list of foods and beverages you should avoid. Contact a dietitian for more information. Questions to ask a health care provider  Do I need to meet with a diabetes educator?  Do I need to meet with a dietitian?  What number can I call if I have questions?  When are the best times to check my blood glucose? Where to find more information:  American Diabetes Association: diabetes.org  Academy of Nutrition and Dietetics: www.eatright.[employer] of Diabetes and Digestive and Kidney Diseases: [employer]  Association of Diabetes Care and Education Specialists: www.diabeteseducator.org Summary  It is important to have healthy eating habits because your blood sugar (glucose) levels are greatly affected by what you eat and drink.  A healthy meal plan will help you control your blood glucose and maintain a healthy lifestyle.  Your health care provider may recommend that you work with a dietitian to make a meal plan that is best for you.  Keep in mind that carbohydrates (carbs) and alcohol have immediate effects on your blood glucose levels. It is important to count carbs and to use alcohol carefully. This information is not intended to replace advice given to you by your health care provider. Make sure you discuss any questions you  have with your health care provider. Document Revised: 08/28/2019 Document Reviewed: 08/28/2019 Elsevier Patient Education  2021 [employer].

## 2020-11-26 NOTE — Addendum Note (Signed)
Addended by: Hinda Kehr on: 11/26/2020 04:35 PM   Modules accepted: Orders

## 2020-11-27 ENCOUNTER — Other Ambulatory Visit: Payer: Self-pay | Admitting: *Deleted

## 2020-11-27 NOTE — Patient Outreach (Signed)
[facility]) Care Management  11/27/2020  Charles Parker [birth year] [id number]   Patient is being transferred to the Chronic Care Management program with provider office.   [facility] [phone]

## 2020-12-01 ENCOUNTER — Ambulatory Visit: Payer: Self-pay | Admitting: *Deleted

## 2020-12-01 ENCOUNTER — Ambulatory Visit: Payer: Medicare Other

## 2020-12-01 NOTE — Chronic Care Management (AMB) (Signed)
  Chronic Care Management   Outreach Note  12/01/2020 Name: Charles Parker MRN: [id number] DOB: 1945/12/30  Referred by: Janith Lima, MD Reason for referral : Chronic Care Management (DM; HTN)  An unsuccessful telephone outreach was attempted today. The patient was referred to the case management team for assistance with care management and care coordination.   Follow Up Plan:   A HIPAA compliant phone message was left for the patient providing contact information and requesting a return call.   The patient has been provided with contact information for the care management team and has been advised to call with any health related questions or concerns.   Telephone follow up appointment with care management team member scheduled for: December 08, 2020 at 1:00 pm  Oneta Rack, RN, BSN, [place] [phone]

## 2020-12-01 NOTE — Patient Instructions (Signed)
Visit Information  As a part of your Medicare benefit, you are eligible for care management and care coordination services. I was unable to reach you by phone today but would like to speak with you about your health related needs. Please call me at [phone] or [phone].  I have scheduled a phone call with you for Monday March 7 at 1:00 pm- please be listening out for my phone call and have your blood sugar readings ready for [place] to review together.  Oneta Rack, RN, BSN, [facility] RN Care Coordination- [place] [phone]

## 2020-12-08 ENCOUNTER — Encounter: Payer: Self-pay | Admitting: *Deleted

## 2020-12-08 ENCOUNTER — Telehealth: Payer: Medicare Other

## 2020-12-08 ENCOUNTER — Telehealth: Payer: Self-pay | Admitting: Pharmacist

## 2020-12-08 ENCOUNTER — Ambulatory Visit: Payer: Self-pay | Admitting: *Deleted

## 2020-12-08 NOTE — Chronic Care Management (AMB) (Signed)
  Chronic Care Management   Outreach Note  12/08/2020 Name: Charles Parker MRN: [id number] DOB: 24-Nov-1945  Referred by: Janith Lima, MD Reason for referral : Chronic Care Management (DM; HTN, Unsuccessful second outreach attempt)  A second unsuccessful telephone outreach was attempted today. The patient was referred to the case management team for assistance with care management and care coordination.   Follow Up Plan:   A HIPAA compliant phone message was left for the patient providing contact information and requesting a return call.   Oneta Rack, RN, BSN, [facility] RN Care Coordination- [place] [phone] direct [phone] mobile

## 2020-12-08 NOTE — Progress Notes (Signed)
error 

## 2020-12-08 NOTE — Patient Instructions (Signed)
Visit Information  As a part of your Medicare benefit, you are eligible for care management and care coordination services. I was unable to reach you by phone today but would like to speak with you about your health related needs. Please call me at [phone].  Oneta Rack, RN, BSN, [facility] RN Care Coordination- [place] [phone] direct [phone] mobile

## 2020-12-15 ENCOUNTER — Other Ambulatory Visit: Payer: Self-pay

## 2020-12-15 ENCOUNTER — Telehealth: Payer: Medicare Other

## 2020-12-15 ENCOUNTER — Ambulatory Visit (INDEPENDENT_AMBULATORY_CARE_PROVIDER_SITE_OTHER): Payer: Medicare Other | Admitting: Endocrinology

## 2020-12-15 ENCOUNTER — Encounter: Payer: Self-pay | Admitting: Endocrinology

## 2020-12-15 VITALS — BP 138/80 | HR 68 | Resp 20 | Ht 71.0 in | Wt 188.4 lb

## 2020-12-15 DIAGNOSIS — Z794 Long term (current) use of insulin: Secondary | ICD-10-CM

## 2020-12-15 DIAGNOSIS — E118 Type 2 diabetes mellitus with unspecified complications: Secondary | ICD-10-CM | POA: Diagnosis not present

## 2020-12-15 DIAGNOSIS — E1165 Type 2 diabetes mellitus with hyperglycemia: Secondary | ICD-10-CM

## 2020-12-15 LAB — GLUCOSE, POCT (MANUAL RESULT ENTRY): POC Glucose: 248 mg/dl — AB (ref 70–99)

## 2020-12-15 MED ORDER — DAPAGLIFLOZIN PROPANEDIOL 10 MG PO TABS: 10.0000 mg | ORAL_TABLET | Freq: Every day | ORAL | 1 refills | Status: DC

## 2020-12-15 NOTE — Patient Instructions (Addendum)
  Check blood sugars on waking up 3-4 days a week  Also check blood sugars about 2 hours after meals and do this after different meals by rotation  Recommended blood sugar levels on waking up are 90-130 and about 2 hours after meal is 130-180  Please bring your blood sugar monitor to each visit, thank you  Cereal 2-3 days a week  INSULIN 42 BEFORE BREAKFAST AND 26 AT SUPPER  iF SKIPPING supper take only 14 units insulin

## 2020-12-15 NOTE — Progress Notes (Signed)
Patient ID: Charles Parker, male   DOB: 1946/08/03, 75 y.o.   MRN: [id number]          Reason for Appointment: Follow-up for Type 2 Diabetes  Referring physician: Scarlette Calico   History of Present Illness:          Date of diagnosis of type 2 diabetes mellitus: 2014        Background history:   He has been on various oral hypoglycemic drugs since the onset including metformin, Januvia and Amaryl Was also on Invokana in 2015 for about a year but not clear why this was stopped His level of control has been quite variable with highest A1c 13.6 in 06/2015  He has been on insulin since about 10/2014 as documented in his record  Recent history:   INSULIN regimen is:  NovoLog mix 36 units before breakfast and 26 units before dinner  Non-insulin hypoglycemic drugs the patient is taking WUJ:WJXBJYN 10 mg daily  His A1c is extremely increased at 13.5 compared to 9.6   Current management, blood sugar patterns and problems identified:  He readings were averaging 340 when he was he was out of insulin for couple of weeks prior to his last visit  Although he was supposed to go up to 40 units in the morning he is taking and 36 units only  Blood sugars are improving although only in the last couple of weeks his blood sugars seem to somewhat better in the evenings  Today his blood sugars well over 200 even though he had breakfast about 5 hours ago  He will frequently eat cereal in the morning but sometimes like today had a sandwich with relatively high fat content  Also will usually eat a sandwich at lunch  Again rechecking blood sugars inconsistently and mostly in the mornings now  He thinks he is taking his [place] daily  He has had only 1 relatively low blood sugar of 66 in the evening likely from skipping his mealtime still taking insulin  FASTING readings are mildly increased  He is trying to do some walking although somewhat limited  His weight is about the same   Side  effects from medications have been: Hives from metformin  Compliance with the medical regimen: Inconsistent  Glucose monitoring:  done< 1 times a   day usually        Glucometer: Accu-Chek guide       Blood Glucose readings recently and 30-day averages from meter download    PRE-MEAL Fasting Lunch Dinner Bedtime Overall  Glucose range:  100-170  203  190, 206    Mean/median:  140    166   POST-MEAL PC Breakfast PC Lunch PC Dinner  Glucose range:    66-217  Mean/median:    185    Previously:  PRE-MEAL  mornings Lunch Dinner Bedtime Overall  Glucose range:  181-298  271-318  356-505    Mean/median:   284   340   POST-MEAL PC Breakfast PC Lunch PC Dinner  Glucose range:   487  347  Mean/median:        Self-care: The diet that the patient has been following is: tries to limit [employer] .      Typical meal intake: Breakfast is eggs/cereal or oatmeal usually, lunch:  Lean cuisine or sandwiches.  Dinner chicken and vegetables.   Snacks will be chips, peanut butter crackers Supper at 6 pm  Dietician visit, most recent:3/19               Weight history:  Wt Readings from Last 3 Encounters:  12/15/20 188 lb 6.4 oz (85.5 kg)  11/13/20 187 lb 6.4 oz (85 kg)  11/07/20 189 lb (85.7 kg)    Glycemic control:   Lab Results  Component Value Date   HGBA1C 13.5 (A) 11/07/2020   HGBA1C 9.6 (H) 08/19/2020   HGBA1C 8.5 (H) 05/12/2020   Lab Results  Component Value Date   MICROALBUR <0.7 11/07/2020   LDLCALC 84 08/19/2020   CREATININE 1.10 11/07/2020   Lab Results  Component Value Date   MICRALBCREAT 1.4 11/07/2020    Lab Results  Component Value Date   FRUCTOSAMINE 660 (H) 10/28/2020   FRUCTOSAMINE 361 (H) 07/04/2020   FRUCTOSAMINE 337 (H) 04/30/2019      Allergies as of 12/15/2020      Reactions   Metformin And Related Diarrhea   Codeine Rash   All over the body      Medication List       Accurate as of December 15, 2020  2:22 PM. If you  have any questions, ask your nurse or doctor.        Alcohol Swabs Pads Use to clean area before insulin injections. DX E11.8   B-D UF III MINI PEN NEEDLES 31G X 5 MM Misc Generic drug: Insulin Pen Needle ADMINISTER 30 UNITS EVERY EVENING   blood glucose meter kit and supplies Kit 1 each by Does not apply route daily as needed. Accu-Chek Aviva preferred by patient. Use up to four times daily as directed. (FOR ICD-9 250.00, 250.01).   Cholecalciferol 50 MCG (2000 UT) Tabs Take 1 tablet (2,000 Units total) by mouth daily.   dapagliflozin propanediol 10 MG Tabs tablet Commonly known as: Farxiga Take 1 tablet (10 mg total) by mouth daily.   gabapentin 300 MG capsule Commonly known as: NEURONTIN TAKE 1 CAPSULE(300 MG) BY MOUTH TWICE DAILY   glimepiride 4 MG tablet Commonly known as: AMARYL Take 1 tablet (4 mg total) by mouth daily before breakfast.   glucose blood test strip Use Accu Chek Aviva Test Strips as instructed to check blood sugar three times daily.   Gvoke HypoPen 2-Pack 1 MG/0.2ML Soaj Generic drug: Glucagon Inject 1 Act into the skin daily as needed.   multivitamin with minerals Tabs tablet Take 1 tablet by mouth daily.   NovoLOG Mix 70/30 FlexPen (70-30) 100 UNIT/ML FlexPen Generic drug: insulin aspart protamine - aspart Inject 26 units under the skin breakfast and at dinner.   rivaroxaban 10 MG Tabs tablet Commonly known as: Xarelto Take 1 tablet (10 mg total) by mouth daily.   simvastatin 40 MG tablet Commonly known as: ZOCOR TAKE 1 TABLET(40 MG) BY MOUTH AT BEDTIME       Allergies:  Allergies  Allergen Reactions  . Metformin And Related Diarrhea  . Codeine Rash    All over the body    Past Medical History:  Diagnosis Date  . Clotting disorder (HCC)    DVT  . Diabetes mellitus    type II  . Diverticulosis   . DVT (deep venous thrombosis) ([place])   . Glaucoma    no eye drops   . Hepatic cyst   . Hypercholesterolemia   . Hypertension    . Renal cyst   . Sleep apnea   . Tubulovillous adenoma     Past Surgical History:  Procedure Laterality Date  .  BLADDER NECK RECONSTRUCTION  02/03/2012   Procedure: BLADDER NECK REPAIR;  Surgeon: Adin Hector, MD;  Location: [facility] ORS;  Service: General;;  . COLONOSCOPY  02/2013   polyp removal(mult.)  . KNEE SURGERY  2004   left  . POLYPECTOMY    . PROCTOSCOPY  02/03/2012   Procedure: PROCTOSCOPY;  Surgeon: Adin Hector, MD;  Location: [facility] ORS;  Service: General;;  . STOMACH SURGERY  02/2012  . TEE WITHOUT CARDIOVERSION N/A 10/05/2016   Procedure: TRANSESOPHAGEAL ECHOCARDIOGRAM (TEE);  Surgeon: Sanda Klein, MD;  Location[facility] ENDOSCOPY;  Service: Cardiovascular;  Laterality: N/A;    Family History  Problem Relation Age of Onset  . Hypertension Mother   . Diabetes Mother   . Cancer Mother        ? location  . Colon cancer Father 67  . Colon polyps Neg Hx   . Liver cancer Neg Hx     Social History:  reports that he has never smoked. He has never used smokeless tobacco. He reports that he does not drink alcohol and does not use drugs.   Review of Systems   Lipid history: Has been taking Zocor 40 mg with variable control, followed by PCP    Lab Results  Component Value Date   CHOL 153 08/19/2020   CHOL 202 (H) 05/12/2020   CHOL 195 03/05/2019   Lab Results  Component Value Date   HDL 43.00 08/19/2020   HDL 50.80 05/12/2020   HDL 46.40 03/05/2019   Lab Results  Component Value Date   LDLCALC 84 08/19/2020   LDLCALC 124 (H) 05/12/2020   LDLCALC 119 (H) 03/05/2019   Lab Results  Component Value Date   TRIG 130.0 08/19/2020   TRIG 134.0 05/12/2020   TRIG 152.0 (H) 03/05/2019   Lab Results  Component Value Date   CHOLHDL 4 08/19/2020   CHOLHDL 4 05/12/2020   CHOLHDL 4 03/05/2019   Lab Results  Component Value Date   LDLDIRECT 115.0 07/02/2019   LDLDIRECT 118.0 11/11/2015   LDLDIRECT 201.0 03/04/2015           He has history of neuropathy taking  gabapentin prescribed by PCP  Most recent foot exam: 2/22  RENAL function is consistently normal with using Farxiga 10 mg  Lab Results  Component Value Date   CREATININE 1.10 11/07/2020   CREATININE 1.27 10/28/2020   CREATININE 1.18 08/19/2020    Blood pressure generally controlled without any medications  BP Readings from Last 3 Encounters:  12/15/20 138/80  11/13/20 122/70  11/07/20 134/84    Has had all 3 Covid shots   Physical Examination:  BP 138/80 (BP Location: Left Arm, Patient Position: Sitting, Cuff Size: Normal)   Pulse 68   Resp 20   Ht _0  (1.803 m)   Wt 188 lb 6.4 oz (85.5 kg)   SpO2 97%   BMI 26.28 kg/m       ASSESSMENT:  Diabetes type 2 on insulin  See history of present illness for detailed discussion of current diabetes management, blood sugar patterns and problems identified   He is on a regimen of premixed insulin twice a day and Farxiga  His A1c is last 13.5  With going back on insulin his blood sugars appear to be better but mostly in the morning Likely still has high readings later in the day and no labs currently available to assess his control  Can do better with monitoring blood sugars after meals and discussed that his  blood sugar is higher today even with being late for lunch    PLAN:   Increase insulin to 42 units in the morning and continue 36 at suppertime Reminded him to follow instructions for insulin given on the printout today Check blood sugars by rotation at different times Take half the insulin dose if planning to skip a meal   May have diabetic shoes for his foot deformities  Patient Instructions   Check blood sugars on waking up 3-4 days a week  Also check blood sugars about 2 hours after meals and do this after different meals by rotation  Recommended blood sugar levels on waking up are 90-130 and about 2 hours after meal is 130-180  Please bring your blood sugar monitor to each visit, thank  you  Cereal 2-3 days a week  INSULIN 42 BEFORE BREAKFAST AND 26 AT SUPPER  iF SKIPPING supper take only 14 units insulin           Elayne Snare 12/15/2020, 2:22 PM   Note: This office note was prepared with Dragon voice recognition system technology. Any transcriptional errors that result from this process are unintentional.

## 2020-12-16 ENCOUNTER — Telehealth: Payer: Medicare Other

## 2020-12-16 ENCOUNTER — Telehealth: Payer: Self-pay | Admitting: *Deleted

## 2020-12-16 ENCOUNTER — Encounter: Payer: Self-pay | Admitting: *Deleted

## 2020-12-16 NOTE — Telephone Encounter (Cosign Needed)
  Chronic Care Management   Outreach Note  12/16/2020 Name: Charles Parker MRN: [id number] DOB: Dec 22, 1945  Referred by: Janith Lima, MD Reason for referral : Chronic Care Management (Unsuccessful outreach # 3; DM/ HTN)  Third unsuccessful telephone outreach was attempted today.  The patient was referred to the case management team for assistance with care management and care coordination. The patient's primary care provider has been notified of our unsuccessful attempts to make or maintain contact with the patient. The care management team is pleased to engage with this patient at any time in the future should he be interested in assistance from the care management team.   Follow Up Plan: We have been unable to make contact with the patient for follow up. The care management team is available to follow up with the patient after provider conversation with the patient regarding recommendation for care management engagement and subsequent re-referral to the care management team.   Oneta Rack, RN, BSN, [place] [phone]: direct office [phone]: mobile

## 2021-01-01 ENCOUNTER — Telehealth: Payer: Self-pay | Admitting: Pharmacist

## 2021-01-01 NOTE — Progress Notes (Signed)
Chronic Care Management Pharmacy Assistant   Name: Charles Parker  MRN: [id number] DOB: 08/09/1946  Reason for Encounter: Chart Review    Medications: Outpatient Encounter Medications as of 01/01/2021  Medication Sig  . Alcohol Swabs PADS Use to clean area before insulin injections. DX E11.8  . blood glucose meter kit and supplies KIT 1 each by Does not apply route daily as needed. Accu-Chek Aviva preferred by patient. Use up to four times daily as directed. (FOR ICD-9 250.00, 250.01).  . Cholecalciferol 50 MCG (2000 UT) TABS Take 1 tablet (2,000 Units total) by mouth daily.  . dapagliflozin propanediol (FARXIGA) 10 MG TABS tablet Take 1 tablet (10 mg total) by mouth daily.  . gabapentin (NEURONTIN) 300 MG capsule TAKE 1 CAPSULE(300 MG) BY MOUTH TWICE DAILY  . glimepiride (AMARYL) 4 MG tablet Take 1 tablet (4 mg total) by mouth daily before breakfast.  . Glucagon (GVOKE HYPOPEN 2-PACK) 1 MG/0.2ML SOAJ Inject 1 Act into the skin daily as needed. (Patient not taking: Reported on 11/17/2020)  . glucose blood test strip Use Accu Chek Aviva Test Strips as instructed to check blood sugar three times daily.  . insulin aspart protamine - aspart (NOVOLOG MIX 70/30 FLEXPEN) (70-30) 100 UNIT/ML FlexPen Inject 26 units under the skin breakfast and at dinner. (Patient taking differently: Inject 26 units under the skin breakfast and at dinner.)  . Insulin Pen Needle (B-D UF III MINI PEN NEEDLES) 31G X 5 MM MISC ADMINISTER 30 UNITS EVERY EVENING  . Multiple Vitamin (MULTIVITAMIN WITH MINERALS) TABS tablet Take 1 tablet by mouth daily.  . rivaroxaban (XARELTO) 10 MG TABS tablet Take 1 tablet (10 mg total) by mouth daily.  . simvastatin (ZOCOR) 40 MG tablet TAKE 1 TABLET(40 MG) BY MOUTH AT BEDTIME   No facility-administered encounter medications on file as of 01/01/2021.    Pharmacist Review:  Reviewed chart for medication changes and adherence.    No gaps in adherence identified. Patient has  follow up scheduled with pharmacy team. No further action required.    Tracy Ellis,CMA Clinical Pharmacist Assistant [phone] 

## 2021-01-12 ENCOUNTER — Encounter: Payer: Medicare Other | Attending: Internal Medicine | Admitting: Registered"

## 2021-01-12 ENCOUNTER — Telehealth: Payer: Self-pay | Admitting: Endocrinology

## 2021-01-12 ENCOUNTER — Other Ambulatory Visit: Payer: Self-pay

## 2021-01-12 VITALS — Wt 198.1 lb

## 2021-01-12 DIAGNOSIS — Z794 Long term (current) use of insulin: Secondary | ICD-10-CM

## 2021-01-12 DIAGNOSIS — E118 Type 2 diabetes mellitus with unspecified complications: Secondary | ICD-10-CM | POA: Diagnosis not present

## 2021-01-12 NOTE — Telephone Encounter (Signed)
New message    1. Which medications need to be refilled? (please list name of each medication and dose if known) insulin aspart protamine - aspart (NOVOLOG MIX 70/30 FLEXPEN) (70-30) 100 UNIT/ML FlexPen  2. Which pharmacy/location (including street and city if local pharmacy) is medication to be sent to?[place], [place]  3

## 2021-01-12 NOTE — Patient Instructions (Signed)
Use the blood sugar log you received today and start checking blood sugar as Dr. Dwyane Dee has advised.  If your blood sugar is too high after your meals, consider having less juice. Instead of having 2 sandwiches, consider having just one and have other foods to fill you up that won't raise your blood sugar too high.   Starchy vegetables will raise blood sugar. You can have some but may need to eat smaller portions.  Potatoes  Yams, sweet potatoes  Corn

## 2021-01-12 NOTE — Progress Notes (Signed)
Diabetes Self-Management Education  Visit Type: Follow-up  Appt. Start Time: 1400 Appt. End Time: 5621  01/12/2021  [name], identified by name and date of birth, is a 75 y.o. male with a diagnosis of Diabetes: Type 2.   ASSESSMENT  There were no vitals taken for this visit. There is no height or weight on file to calculate BMI.   Patient states he remembers his blood sugar readings and does not write them down. Pt states is FBS has been 145-165 mg/dL and 135-170 mg/dL before meals. Pt states he does not check after meals.   Medications (insulin - Novolog 70/30) Pt states he has not increased his insulin yet. Pt states prior to this appointment he stopped at Dr. Ronnie Derby office to ask for refill of Novolog 70/30. Pt states he received some pens in the mail but they are different than what he got from the pharmacy. Pt states he plans to throw them away after he gets the Novolog refilled today. Pt states he will start taking the increased dose of insulin from his visit with Dr. Dwyane Dee last month (42 units before breakfast and 26 units at supper; 14 units if skipping a meal)   Diabetes Self-Management Education - 01/12/21 1452      Visit Information   Visit Type Follow-up      Initial Visit   Diabetes Type Type 2      Dietary Intake   Breakfast pancake, sausage, 6 oz OJ    Snack (morning) PB crackers    Lunch Peanut butter and jelly on whole wheat bread, sugar free soda    Snack (afternoon) banana    Dinner corn, green beans, pinto beans    Snack (evening) crackers    Beverage(s) water, OJ, diet soda      Patient Education   Nutrition management  Meal options for control of blood glucose level and chronic complications.    Monitoring Purpose and frequency of SMBG.      Individualized Goals (developed by patient)   Nutrition General guidelines for healthy choices and portions discussed    Medications take my medication as prescribed;Other (comment)    Monitoring  test  blood glucose pre and post meals as discussed      Patient Self-Evaluation of Goals - Patient rates self as meeting previously set goals (% of time)   Physical Activity 50 - 75 %      Outcomes   Expected Outcomes Demonstrated interest in learning. Expect positive outcomes    Future DMSE 4-6 wks    Program Status Completed      Subsequent Visit   Since your last visit have you continued or begun to take your medications as prescribed? No   has not increased 70/30 insulin   Since your last visit have you experienced any weight changes? No change           Individualized Plan for Diabetes Self-Management Training:   Learning Objective:  Patient will have a greater understanding of diabetes self-management. Patient education plan is to attend individual and/or group sessions per assessed needs and concerns.    Patient Instructions  Use the blood sugar log you received today and start checking blood sugar as Dr. Dwyane Dee has advised.  If your blood sugar is too high after your meals, consider having less juice. Instead of having 2 sandwiches, consider having just one and have other foods to fill you up that won't raise your blood sugar too high.   Starchy vegetables  will raise blood sugar. You can have some but may need to eat smaller portions.  Potatoes  Yams, sweet potatoes  Corn   Expected Outcomes:  Demonstrated interest in learning. Expect positive outcomes  Education material provided: blood sugar log book with Dr. Dwyane Dee AVS notes from last visit written in the book.  If problems or questions, patient to contact team via:  Phone  Future DSME appointment: 4-6 wks

## 2021-01-13 ENCOUNTER — Encounter: Payer: Self-pay | Admitting: Podiatry

## 2021-01-13 ENCOUNTER — Ambulatory Visit (INDEPENDENT_AMBULATORY_CARE_PROVIDER_SITE_OTHER): Payer: Medicare Other | Admitting: Podiatry

## 2021-01-13 DIAGNOSIS — M2012 Hallux valgus (acquired), left foot: Secondary | ICD-10-CM

## 2021-01-13 DIAGNOSIS — M2011 Hallux valgus (acquired), right foot: Secondary | ICD-10-CM

## 2021-01-13 DIAGNOSIS — E1151 Type 2 diabetes mellitus with diabetic peripheral angiopathy without gangrene: Secondary | ICD-10-CM

## 2021-01-13 DIAGNOSIS — B351 Tinea unguium: Secondary | ICD-10-CM

## 2021-01-13 DIAGNOSIS — M205X2 Other deformities of toe(s) (acquired), left foot: Secondary | ICD-10-CM

## 2021-01-13 NOTE — Progress Notes (Signed)
This patient returns to my office for at risk foot care.  This patient requires this care by a professional since this patient will be at risk due to having diabetes type 2 and coagulation defect.  Patient is taking xarelto.  This patient is unable to cut nails himself since the patient cannot reach his nails.These nails are painful walking and wearing shoes.  This patient presents for at risk foot care today.  General Appearance  Alert, conversant and in no acute stress.  Vascular  Dorsalis pedis and posterior tibial  pulses are weakly palpable  bilaterally.  Capillary return is within normal limits  bilaterally. Temperature is within normal limits  bilaterally.  Neurologic  Senn-Weinstein monofilament wire test within normal limits  bilaterally. Muscle power within normal limits bilaterally.  Nails Thick disfigured discolored nails with subungual debris  from hallux to fifth toes bilaterally.  Orthopedic  No limitations of motion  feet .  No crepitus or effusions noted.  HAV with overlapping second digit  B/L.  Skin  normotropic skin with no porokeratosis noted bilaterally.  No signs of infections or ulcers noted.     Onychomycosis  Pain in right toes  Pain in left toes  Consent was obtained for treatment procedures.   Mechanical debridement of nails 1-5  bilaterally performed with a nail nipper.  Filed with dremel without incident.  Patient qualifies for diabetic shoes due to diabetes ,HAV and hammer toes  B/L. Dr.  Dwyane Dee denied his shoes.   Return office visit     3 months                Told patient to return for periodic foot care and evaluation due to potential at risk complications.   Gardiner Barefoot DPM

## 2021-01-19 ENCOUNTER — Other Ambulatory Visit: Payer: Self-pay | Admitting: *Deleted

## 2021-01-19 MED ORDER — NOVOLOG MIX 70/30 FLEXPEN (70-30) 100 UNIT/ML ~~LOC~~ SUPN: PEN_INJECTOR | SUBCUTANEOUS | 2 refills | Status: DC

## 2021-01-19 NOTE — Telephone Encounter (Signed)
Rx sent 

## 2021-02-04 ENCOUNTER — Telehealth: Payer: Self-pay | Admitting: Podiatry

## 2021-02-04 ENCOUNTER — Other Ambulatory Visit: Payer: Self-pay

## 2021-02-04 NOTE — Telephone Encounter (Signed)
Left message for pt that his diabetic shoes are in and to call to schedule an appt for next week as authorization expires 5.14.... I am going to sen my chart message as well.

## 2021-02-05 ENCOUNTER — Encounter: Payer: Self-pay | Admitting: Internal Medicine

## 2021-02-05 ENCOUNTER — Ambulatory Visit (INDEPENDENT_AMBULATORY_CARE_PROVIDER_SITE_OTHER): Payer: Medicare Other | Admitting: Internal Medicine

## 2021-02-05 VITALS — BP 134/72 | HR 80 | Temp 98.3°F | Ht 71.0 in | Wt 194.6 lb

## 2021-02-05 DIAGNOSIS — Z23 Encounter for immunization: Secondary | ICD-10-CM

## 2021-02-05 DIAGNOSIS — I10 Essential (primary) hypertension: Secondary | ICD-10-CM | POA: Diagnosis not present

## 2021-02-05 DIAGNOSIS — E118 Type 2 diabetes mellitus with unspecified complications: Secondary | ICD-10-CM

## 2021-02-05 DIAGNOSIS — Z794 Long term (current) use of insulin: Secondary | ICD-10-CM | POA: Diagnosis not present

## 2021-02-05 DIAGNOSIS — E1165 Type 2 diabetes mellitus with hyperglycemia: Secondary | ICD-10-CM

## 2021-02-05 DIAGNOSIS — R002 Palpitations: Secondary | ICD-10-CM | POA: Insufficient documentation

## 2021-02-05 DIAGNOSIS — D696 Thrombocytopenia, unspecified: Secondary | ICD-10-CM

## 2021-02-05 LAB — CBC WITH DIFFERENTIAL/PLATELET
Basophils Absolute: 0 10*3/uL (ref 0.0–0.1)
Basophils Relative: 0.6 % (ref 0.0–3.0)
Eosinophils Absolute: 0 10*3/uL (ref 0.0–0.7)
Eosinophils Relative: 0.9 % (ref 0.0–5.0)
HCT: 42.4 % (ref 39.0–52.0)
Hemoglobin: 14.6 g/dL (ref 13.0–17.0)
Lymphocytes Relative: 32.8 % (ref 12.0–46.0)
Lymphs Abs: 1.5 10*3/uL (ref 0.7–4.0)
MCHC: 34.5 g/dL (ref 30.0–36.0)
MCV: 92.8 fl (ref 78.0–100.0)
Monocytes Absolute: 0.5 10*3/uL (ref 0.1–1.0)
Monocytes Relative: 10.5 % (ref 3.0–12.0)
Neutro Abs: 2.5 10*3/uL (ref 1.4–7.7)
Neutrophils Relative %: 55.2 % (ref 43.0–77.0)
Platelets: 143 10*3/uL — ABNORMAL LOW (ref 150.0–400.0)
RBC: 4.57 Mil/uL (ref 4.22–5.81)
RDW: 12.9 % (ref 11.5–15.5)
WBC: 4.6 10*3/uL (ref 4.0–10.5)

## 2021-02-05 LAB — BASIC METABOLIC PANEL
BUN: 23 mg/dL (ref 6–23)
CO2: 24 mEq/L (ref 19–32)
Calcium: 9.6 mg/dL (ref 8.4–10.5)
Chloride: 107 mEq/L (ref 96–112)
Creatinine, Ser: 1.07 mg/dL (ref 0.40–1.50)
GFR: 68.15 mL/min (ref >60.00)
Glucose, Bld: 313 mg/dL — ABNORMAL HIGH (ref 70–99)
Potassium: 4.2 mEq/L (ref 3.5–5.1)
Sodium: 139 mEq/L (ref 135–145)

## 2021-02-05 LAB — FOLATE: Folate: 17.9 ng/mL (ref >5.9)

## 2021-02-05 LAB — VITAMIN B12: Vitamin B-12: 419 pg/mL (ref 211–911)

## 2021-02-05 LAB — HEMOGLOBIN A1C: Hgb A1c MFr Bld: 9.7 % — ABNORMAL HIGH (ref 4.6–6.5)

## 2021-02-05 NOTE — Progress Notes (Signed)
Subjective:  Patient ID: Charles Parker, male    DOB: 04-14-1946  Age: 75 y.o. MRN: [id number]  CC: Diabetes and Hypertension  This visit occurred during the SARS-CoV-2 public health emergency.  Safety protocols were in place, including screening questions prior to the visit, additional usage of staff PPE, and extensive cleaning of exam room while observing appropriate contact time as indicated for disinfecting solutions.    HPI Charles Parker presents for f/up -  He complains of a 1 month history of an intermittent sensation that his heart is fluttering.  He says this always occurs at rest.  He says the last episode occurred about 24 hours ago.  He denies chest pain, dizziness, lightheadedness, near-syncope, or syncope.  He continues to complain that his blood sugars are not well controlled and he has polyuria.  Outpatient Medications Prior to Visit  Medication Sig Dispense Refill  . Alcohol Swabs PADS Use to clean area before insulin injections. DX E11.8 200 each 3  . blood glucose meter kit and supplies KIT 1 each by Does not apply route daily as needed. Accu-Chek Aviva preferred by patient. Use up to four times daily as directed. (FOR ICD-9 250.00, 250.01).    . Cholecalciferol 50 MCG (2000 UT) TABS Take 1 tablet (2,000 Units total) by mouth daily. 90 tablet 3  . dapagliflozin propanediol (FARXIGA) 10 MG TABS tablet Take 1 tablet (10 mg total) by mouth daily. 90 tablet 1  . gabapentin (NEURONTIN) 300 MG capsule TAKE 1 CAPSULE(300 MG) BY MOUTH TWICE DAILY 180 capsule 1  . Glucagon (GVOKE HYPOPEN 2-PACK) 1 MG/0.2ML SOAJ Inject 1 Act into the skin daily as needed. 2 mL 5  . glucose blood test strip Use Accu Chek Aviva Test Strips as instructed to check blood sugar three times daily. 100 each 3  . Insulin Pen Needle (B-D UF III MINI PEN NEEDLES) 31G X 5 MM MISC ADMINISTER 30 UNITS EVERY EVENING 100 each 2  . Multiple Vitamin (MULTIVITAMIN WITH MINERALS) TABS tablet Take 1 tablet by mouth  daily.    . rivaroxaban (XARELTO) 10 MG TABS tablet Take 1 tablet (10 mg total) by mouth daily. 90 tablet 1  . simvastatin (ZOCOR) 40 MG tablet TAKE 1 TABLET(40 MG) BY MOUTH AT BEDTIME 90 tablet 1  . glimepiride (AMARYL) 4 MG tablet Take 1 tablet (4 mg total) by mouth daily before breakfast. 90 tablet 0  . insulin aspart protamine - aspart (NOVOLOG MIX 70/30 FLEXPEN) (70-30) 100 UNIT/ML FlexPen Inject 6 units under the skin breakfast and 26 units at dinner. 60 mL 2   No facility-administered medications prior to visit.    ROS Review of Systems  Constitutional: Negative for diaphoresis and fatigue.  HENT: Negative.   Eyes: Negative for visual disturbance.  Respiratory: Negative for chest tightness, shortness of breath and wheezing.   Cardiovascular: Positive for palpitations. Negative for chest pain and leg swelling.  Gastrointestinal: Negative for abdominal pain and diarrhea.  Endocrine: Positive for polyuria. Negative for polydipsia and polyphagia.  Genitourinary: Negative.  Negative for difficulty urinating and dysuria.  Musculoskeletal: Negative.   Skin: Negative for color change and pallor.  Neurological: Negative.  Negative for dizziness, weakness and light-headedness.  Hematological: Negative for adenopathy. Does not bruise/bleed easily.  Psychiatric/Behavioral: Negative.     Objective:  BP 134/72 (BP Location: Right Arm, Patient Position: Sitting, Cuff Size: Large)   Pulse 80   Temp 98.3 F (36.8 C) (Oral)   Ht 5' 11" (1.803 m)  Wt 194 lb 9.6 oz (88.3 kg)   SpO2 97%   BMI 27.14 kg/m   BP Readings from Last 3 Encounters:  02/05/21 134/72  12/15/20 138/80  11/13/20 122/70    Wt Readings from Last 3 Encounters:  02/05/21 194 lb 9.6 oz (88.3 kg)  01/12/21 198 lb 1.6 oz (89.9 kg)  12/15/20 188 lb 6.4 oz (85.5 kg)    Physical Exam Vitals reviewed.  HENT:     Nose: Nose normal.     Mouth/Throat:     Mouth: Mucous membranes are moist.  Eyes:      Conjunctiva/sclera: Conjunctivae normal.  Cardiovascular:     Rate and Rhythm: Normal rate and regular rhythm.     Heart sounds: No murmur heard.     Comments: EKG- NSR with sinus arrhythmia, 79 bpm Minimal LVH No Q waves Pulmonary:     Breath sounds: No stridor. No wheezing, rhonchi or rales.  Abdominal:     General: Abdomen is flat.     Palpations: There is no mass.     Tenderness: There is no abdominal tenderness.  Musculoskeletal:     Cervical back: Neck supple.     Right lower leg: No edema.     Left lower leg: No edema.  Lymphadenopathy:     Cervical: No cervical adenopathy.  Skin:    General: Skin is warm and dry.  Neurological:     General: No focal deficit present.     Mental Status: He is alert.     Lab Results  Component Value Date   WBC 4.6 02/05/2021   HGB 14.6 02/05/2021   HCT 42.4 02/05/2021   PLT 143.0 (L) 02/05/2021   GLUCOSE 313 (H) 02/05/2021   CHOL 153 08/19/2020   TRIG 130.0 08/19/2020   HDL 43.00 08/19/2020   LDLDIRECT 115.0 07/02/2019   LDLCALC 84 08/19/2020   ALT 18 08/19/2020   AST 18 08/19/2020   NA 139 02/05/2021   K 4.2 02/05/2021   CL 107 02/05/2021   CREATININE 1.07 02/05/2021   BUN 23 02/05/2021   CO2 24 02/05/2021   TSH 1.55 11/07/2020   PSA 1.82 11/07/2020   INR 1.37 09/26/2016   HGBA1C 9.7 (H) 02/05/2021   MICROALBUR <0.7 11/07/2020    No results found.  Assessment & Plan:   [name] was seen today for diabetes and hypertension.  Diagnoses and all orders for this visit:  Essential hypertension- His blood pressure is adequately well controlled. -     Basic metabolic panel; Future -     Basic metabolic panel  Type 2 diabetes mellitus with complication, with long-term current use of insulin ([place])- See below. -     Basic metabolic panel; Future -     Hemoglobin A1c; Future -     Hemoglobin A1c -     Basic metabolic panel -     glimepiride (AMARYL) 4 MG tablet; Take 1 tablet (4 mg total) by mouth daily before  breakfast. -     insulin aspart protamine - aspart (NOVOLOG MIX 70/30 FLEXPEN) (70-30) 100 UNIT/ML FlexPen; Inject 20 units under the skin breakfast and 26 units at dinner. -     Continuous Blood Gluc Sensor (FREESTYLE LIBRE 2 SENSOR) MISC; 1 Act by Does not apply route daily. -     Continuous Blood Gluc Receiver (FREESTYLE LIBRE 2 READER) DEVI; 1 Act by Does not apply route daily.  Thrombocytopenia ([place])- His platelet count is stable.  This is likely ITP. -  CBC with Differential/Platelet; Future -     Vitamin B12; Future -     Folate; Future -     Folate -     Vitamin B12 -     CBC with Differential/Platelet  Rapid palpitations- His EKG is remarkable for sinus arrhythmia but I do not think this is causing his sensation of heart fluttering.  I recommended that he wear a cardiac event monitor to see if there is a dysrhythmia like A. fib, a flutter, or SVT. -     CARDIAC EVENT MONITOR; Future -     EKG 12-Lead  Need for vaccination -     Pneumococcal polysaccharide vaccine 23-valent greater than or equal to 2yo subcutaneous/IM  Uncontrolled type 2 diabetes mellitus with hyperglycemia, with long-term current use of insulin ([place])- His blood sugar is still not adequately well controlled.  I recommended that he continue to increase the dose of his NovoLog Mix to try to get better control.  I also recommend that he start using a CGM. -     glimepiride (AMARYL) 4 MG tablet; Take 1 tablet (4 mg total) by mouth daily before breakfast. -     insulin aspart protamine - aspart (NOVOLOG MIX 70/30 FLEXPEN) (70-30) 100 UNIT/ML FlexPen; Inject 20 units under the skin breakfast and 26 units at dinner. -     Continuous Blood Gluc Sensor (FREESTYLE LIBRE 2 SENSOR) MISC; 1 Act by Does not apply route daily. -     Continuous Blood Gluc Receiver (FREESTYLE LIBRE 2 READER) DEVI; 1 Act by Does not apply route daily.   I have changed Charles Parker "A.D."'s NovoLOG Mix 70/30 FlexPen. I am also having him  start on FreeStyle Libre 2 Sensor and [employer] 2 Reader. Additionally, I am having him maintain his multivitamin with minerals, Alcohol Swabs, gabapentin, glucose blood, B-D UF III MINI PEN NEEDLES, rivaroxaban, Gvoke HypoPen 2-Pack, Cholecalciferol, blood glucose meter kit and supplies, simvastatin, dapagliflozin propanediol, and glimepiride.  Meds ordered this encounter  Medications  . glimepiride (AMARYL) 4 MG tablet    Sig: Take 1 tablet (4 mg total) by mouth daily before breakfast.    Dispense:  90 tablet    Refill:  0  . insulin aspart protamine - aspart (NOVOLOG MIX 70/30 FLEXPEN) (70-30) 100 UNIT/ML FlexPen    Sig: Inject 20 units under the skin breakfast and 26 units at dinner.    Dispense:  60 mL    Refill:  2  . Continuous Blood Gluc Sensor (FREESTYLE LIBRE 2 SENSOR) MISC    Sig: 1 Act by Does not apply route daily.    Dispense:  2 each    Refill:  5  . Continuous Blood Gluc Receiver (FREESTYLE LIBRE 2 READER) DEVI    Sig: 1 Act by Does not apply route daily.    Dispense:  2 each    Refill:  5     Follow-up: Return in about 3 months (around 05/08/2021).  Scarlette Calico, MD

## 2021-02-05 NOTE — Patient Instructions (Signed)

## 2021-02-06 MED ORDER — GLIMEPIRIDE 4 MG PO TABS: 4.0000 mg | ORAL_TABLET | Freq: Every day | ORAL | 0 refills | Status: DC

## 2021-02-06 MED ORDER — FREESTYLE LIBRE 2 SENSOR MISC: 1.0000 | Freq: Every day | 5 refills | Status: DC

## 2021-02-06 MED ORDER — NOVOLOG MIX 70/30 FLEXPEN (70-30) 100 UNIT/ML ~~LOC~~ SUPN: PEN_INJECTOR | SUBCUTANEOUS | 2 refills | Status: DC

## 2021-02-06 MED ORDER — FREESTYLE LIBRE 2 READER DEVI: 1.0000 | Freq: Every day | 5 refills | Status: DC

## 2021-02-09 ENCOUNTER — Ambulatory Visit (INDEPENDENT_AMBULATORY_CARE_PROVIDER_SITE_OTHER): Payer: Medicare Other

## 2021-02-09 ENCOUNTER — Other Ambulatory Visit: Payer: Self-pay

## 2021-02-09 DIAGNOSIS — E1151 Type 2 diabetes mellitus with diabetic peripheral angiopathy without gangrene: Secondary | ICD-10-CM | POA: Diagnosis not present

## 2021-02-09 DIAGNOSIS — M2012 Hallux valgus (acquired), left foot: Secondary | ICD-10-CM | POA: Diagnosis not present

## 2021-02-09 DIAGNOSIS — M2042 Other hammer toe(s) (acquired), left foot: Secondary | ICD-10-CM | POA: Diagnosis not present

## 2021-02-09 DIAGNOSIS — M2011 Hallux valgus (acquired), right foot: Secondary | ICD-10-CM | POA: Diagnosis not present

## 2021-02-09 DIAGNOSIS — M2041 Other hammer toe(s) (acquired), right foot: Secondary | ICD-10-CM | POA: Diagnosis not present

## 2021-02-09 NOTE — Progress Notes (Signed)
Patient in office today for diabetic shoe pick-up. Shoes were tried on and patient stated he like the way they fit. The break-in process was reviewed with the patient in detail. Patient verbalized understanding. Advised patient to call the office with any questions, comments, or concerns.

## 2021-02-12 ENCOUNTER — Telehealth: Payer: Self-pay | Admitting: Internal Medicine

## 2021-02-12 ENCOUNTER — Ambulatory Visit (INDEPENDENT_AMBULATORY_CARE_PROVIDER_SITE_OTHER): Payer: Medicare Other

## 2021-02-12 DIAGNOSIS — R002 Palpitations: Secondary | ICD-10-CM

## 2021-02-12 NOTE — Telephone Encounter (Signed)
Called pt, LVM.   

## 2021-02-12 NOTE — Telephone Encounter (Signed)
Patient was instructed to call when he got his heart monitor, it arrived this morning.

## 2021-02-16 ENCOUNTER — Telehealth: Payer: Medicare Other

## 2021-02-16 NOTE — Progress Notes (Deleted)
Chronic Care Management Pharmacy Note  02/16/2021 Name:  Charles Parker MRN:  [id number] DOB:  23-Nov-1945  Subjective: Charles Parker is an 75 y.o. year old male who is a primary patient of Charles Lima, MD.  The CCM team was consulted for assistance with disease management and care coordination needs.    Engaged with patient by telephone for follow up visit in response to provider referral for pharmacy case management and/or care coordination services.   Consent to Services:  The patient was given information about Chronic Care Management services, agreed to services, and gave verbal consent prior to initiation of services.  Please see initial visit note for detailed documentation.   Patient Care Team: Charles Lima, MD as PCP - General (Internal Medicine) Wilford Corner, MD as Consulting Physician (Gastroenterology) Philemon Kingdom, MD as Consulting Physician (Internal Medicine) Gardiner Barefoot, DPM as Consulting Physician (Podiatry) Charlton Haws, [facility] as Pharmacist (Pharmacist)   Patient lives at home alone. His wife has passed. He is working on improving blood sugars by walking, watching what he eats and taking medicines as prescribed.   Recent office visits: 02/05/21 Dr [name] OV: chronic f/u. A1c down to 9.7%. c/o heart fluttering, ordered cardiac event monitor. Changed insulin to 20 units AM and 26 units PM.  11/07/20 Dr [name] OV: CPX, uncontrolled BG. A1c 13.5%. Rec'd against SGLT2 due to high A1c, dc'd [place]. Started glimepiride 4 mg, Freestyle Libre, [place], Vitamin D. Referred to nutrition.  Recent consult visits: 01/12/21 RD Edwina Barth (nutrition): DM counseling  12/15/20 Dr Dwyane Dee (endocrine): Insulin 42 units AM, 26 units PM. If skipping supper take 14 units PM.  11/13/20 Dr Dwyane Dee (endocrine): increase insulin to 40 units AM and 30 units PM. Resume Wilder Glade.  11/12/20 RD Levie Heritage (nutrition consult)  Hospital visits: None in previous 6  months  Objective:  Lab Results  Component Value Date   CREATININE 1.07 02/05/2021   BUN 23 02/05/2021   GFR 68.15 02/05/2021   GFRNONAA 87 12/23/2016   GFRAA >89 12/23/2016   NA 139 02/05/2021   K 4.2 02/05/2021   CALCIUM 9.6 02/05/2021   CO2 24 02/05/2021    Lab Results  Component Value Date/Time   HGBA1C 9.7 (H) 02/05/2021 02:07 PM   HGBA1C 13.5 (A) 11/07/2020 01:40 PM   HGBA1C 9.6 (H) 08/19/2020 09:25 AM   FRUCTOSAMINE 660 (H) 10/28/2020 08:47 AM   FRUCTOSAMINE 361 (H) 07/04/2020 01:42 PM   GFR 68.15 02/05/2021 02:07 PM   GFR 66.04 11/07/2020 12:08 PM   MICROALBUR <0.7 11/07/2020 12:08 PM   MICROALBUR <0.7 02/04/2020 02:28 PM    Last diabetic Eye exam:  Lab Results  Component Value Date/Time   HMDIABEYEEXA No Retinopathy 04/10/2019 12:00 AM    Last diabetic Foot exam:  Lab Results  Component Value Date/Time   HMDIABFOOTEX No neuropathy 07/09/2020 12:00 AM     Lab Results  Component Value Date   CHOL 153 08/19/2020   HDL 43.00 08/19/2020   LDLCALC 84 08/19/2020   LDLDIRECT 115.0 07/02/2019   TRIG 130.0 08/19/2020   CHOLHDL 4 08/19/2020    Hepatic Function Latest Ref Rng & Units 08/19/2020 05/12/2020 05/12/2020  Total Protein 6.0 - 8.3 g/dL 7.0 - 7.5  Albumin 3.5 - 5.2 g/dL 4.1 4.4 4.4  AST 0 - 37 U/L 18 - 18  ALT 0 - 53 U/L 18 - 23  Alk Phosphatase 39 - 117 U/L 58 - 61  Total Bilirubin 0.2 - 1.2 mg/dL 0.7 - 0.4  Lab Results  Component Value Date/Time   TSH 1.55 11/07/2020 12:08 PM   TSH 0.67 09/08/2016 03:13 PM    CBC Latest Ref Rng & Units 02/05/2021 11/07/2020 09/24/2019  WBC 4.0 - 10.5 K/uL 4.6 3.9(L) 4.3  Hemoglobin 13.0 - 17.0 g/dL 14.6 15.0 16.2  Hematocrit 39.0 - 52.0 % 42.4 44.3 49.3  Platelets 150.0 - 400.0 K/uL 143.0(L) 134.0(L) 176.0    Lab Results  Component Value Date/Time   VD25OH 21.10 (L) 11/07/2020 12:08 PM   VD25OH 37.61 03/29/2019 02:48 PM    Clinical ASCVD: No  The 10-year ASCVD risk score Mikey Bussing DC Jr., et al., 2013) is:  25%   Values used to calculate the score:     Age: 68 years     Sex: Male     Is Non-Hispanic African American: Yes     Diabetic: Yes     Tobacco smoker: No     Systolic Blood Pressure: 621 mmHg     Is BP treated: No     HDL Cholesterol: 43 mg/dL     Total Cholesterol: 153 mg/dL    Depression screen [facility] 2/9 11/12/2020 12/12/2019 06/19/2019  Decreased Interest 0 0 0  Down, Depressed, Hopeless 0 0 0  PHQ - 2 Score 0 0 0  Altered sleeping - - -  Tired, decreased energy - - -  Change in appetite - - -  Feeling bad or failure about yourself  - - -  Trouble concentrating - - -  Moving slowly or fidgety/restless - - -  Suicidal thoughts - - -  PHQ-9 Score - - -  Difficult doing work/chores - - -  Some recent data might be hidden     Social History   Tobacco Use  Smoking Status Never Smoker  Smokeless Tobacco Never Used   BP Readings from Last 3 Encounters:  02/05/21 134/72  12/15/20 138/80  11/13/20 122/70   Pulse Readings from Last 3 Encounters:  02/05/21 80  12/15/20 68  11/13/20 73   Wt Readings from Last 3 Encounters:  02/05/21 194 lb 9.6 oz (88.3 kg)  01/12/21 198 lb 1.6 oz (89.9 kg)  12/15/20 188 lb 6.4 oz (85.5 kg)    Assessment/Interventions: Review of patient past medical history, allergies, medications, health status, including review of consultants reports, laboratory and other test data, was performed as part of comprehensive evaluation and provision of chronic care management services.   SDOH:  (Social Determinants of Health) assessments and interventions performed: Yes   CCM Care Plan  Allergies  Allergen Reactions  . Metformin And Related Diarrhea  . Codeine Rash    All over the body    Medications Reviewed Today    Reviewed by Charles Lima, MD (Physician) on 02/05/21 at 1355  Med List Status: <None>  Medication Order Taking? Sig Documenting Provider Last Dose Status Informant  Alcohol Swabs PADS [id number] Yes Use to clean area before insulin  injections. DX E11.8 Elayne Snare, MD Taking Active   blood glucose meter kit and supplies KIT [id number] Yes 1 each by Does not apply route daily as needed. Accu-Chek Aviva preferred by patient. Use up to four times daily as directed. (FOR ICD-9 250.00, 250.01). [provider] Taking Active   Cholecalciferol 50 MCG (2000 UT) TABS [id number] Yes Take 1 tablet (2,000 Units total) by mouth daily. Charles Lima, MD Taking Active   dapagliflozin propanediol (FARXIGA) 10 MG TABS tablet [id number] Yes Take 1 tablet (10 mg total) by mouth daily.  Elayne Snare, MD Taking Active   gabapentin (NEURONTIN) 300 MG capsule [id number] Yes TAKE 1 CAPSULE(300 MG) BY MOUTH TWICE DAILY Elayne Snare, MD Taking Active   glimepiride (AMARYL) 4 MG tablet [id number] Yes Take 1 tablet (4 mg total) by mouth daily before breakfast. Charles Lima, MD Taking Active   Glucagon (GVOKE HYPOPEN 2-PACK) 1 MG/0.2ML Darden Palmer [id number] Yes Inject 1 Act into the skin daily as needed. Charles Lima, MD Taking Active   glucose blood test strip [id number] Yes Use Accu Chek Aviva Test Strips as instructed to check blood sugar three times daily. Elayne Snare, MD Taking Active   insulin aspart protamine - aspart (NOVOLOG MIX 70/30 FLEXPEN) (70-30) 100 UNIT/ML FlexPen [id number] Yes Inject 6 units under the skin breakfast and 26 units at dinner. Elayne Snare, MD Taking Active   Insulin Pen Needle (B-D UF III MINI PEN NEEDLES) 31G X 5 MM MISC [id number] Yes ADMINISTER 30 UNITS EVERY Sharol Given, MD Taking Active   Multiple Vitamin (MULTIVITAMIN WITH MINERALS) TABS tablet [id number] Yes Take 1 tablet by mouth daily. [provider] Taking Active   rivaroxaban (XARELTO) 10 MG TABS tablet [id number] Yes Take 1 tablet (10 mg total) by mouth daily. Charles Lima, MD Taking Active   simvastatin (ZOCOR) 40 MG tablet [id number] Yes TAKE 1 TABLET(40 MG) BY MOUTH AT BEDTIME Charles Lima, MD Taking Active           Patient Active Problem  List   Diagnosis Date Noted  . Thrombocytopenia ([place]) 02/05/2021  . Rapid palpitations 02/05/2021  . Encounter for general adult medical examination with abnormal findings 11/07/2020  . Elevated factor VIII level 10/08/2019  . Candidal balanitis 03/29/2019  . Erectile dysfunction due to diabetes mellitus ([place]) 03/29/2019  . Essential hypertension 05/18/2018  . Vitamin D insufficiency 05/18/2018  . Hallux rigidus, right foot 02/14/2017  . Degenerative arthritis of left knee 01/11/2017  . OAB (overactive bladder) 01/09/2017  . Medication monitoring encounter 12/23/2016  . Iron deficiency 12/16/2016  . Type 2 diabetes mellitus with complication, with long-term current use of insulin ([place]) 12/15/2016  . Hyperlipidemia with target LDL less than 70 10/21/2016  . Thoracic spine fracture ([place]) 10/21/2016  . Benign prostatic hyperplasia (BPH) with straining on urination 06/20/2013  . 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 12/27/2011    Immunization History  Administered Date(s) Administered  . Fluad Quad(high Dose 65+) 07/17/2019, 07/08/2020  . Influenza, High Dose Seasonal PF 06/13/2017, 07/06/2018  . Influenza,inj,Quad PF,6+ Mos 06/20/2013, 07/10/2014  . Influenza-Unspecified 06/05/2015, 07/26/2016  . PFIZER(Purple Top)SARS-COV-2 Vaccination 10/26/2019, 11/16/2019, 07/19/2020  . PPD Test 10/29/2016  . Pneumococcal Conjugate-13 06/20/2013, 07/23/2014  . Pneumococcal Polysaccharide-23 06/19/2015, 02/05/2021  . Tdap 06/20/2013  . Zoster Recombinat (Shingrix) 07/17/2019    Conditions to be addressed/monitored:  Hypertension, Hyperlipidemia, Diabetes, Osteoarthritis and Hypercoaguable state  Patient Care Plan: General Plan of Care (Adult)    Problem Identified: Health Promotion or Disease Self-Management (General Plan of Care)     Long-Range Goal: Self-Management Plan Developed   Start Date: 08/26/2020  This Visit's  Progress: Not on track  Note:   Evidence-based guidance:  Review biopsychosocial determinants of health screens.  Determine level of modifiable health risk.  Assess level of patient activation, level of readiness, importance and confidence to make changes.  Evoke change talk using open-ended questions, pros and cons, as well as looking forward.  Identify areas where behavior change may lead to  improved health.  Partner with patient to develop a robust self-management plan that includes lifestyle factors, such as weight loss, exercise and healthy nutrition, as well as goals specific to disease risks.  Support patient and family/caregiver active participation in decision-making and self-management plan.  Implement additional goals and interventions based on identified risk factors to reduce health risk.  Facilitate advance care planning.  Review need for preventive screening based on age, sex, family history and health history.   Notes:  Patient needs encouragement to follow up more with health care needs.   Task: Mutually Develop and Royce Macadamia Achievement of Patient Goals   Due Date: 12/01/2020  Note:   Care Management Activities:    - decision-making supported - health risks reviewed - problem-solving facilitated - questions answered - reassurance provided - self-reliance encouraged - verbalization of feelings encouraged    Notes:    Problem Identified: Coping Skills (General Plan of Care)   Priority: Medium  Onset Date: 08/26/2020    Long-Range Goal: Coping Skills Enhanced   Note:   Evidence-based guidance:  Acknowledge, normalize and validate difficulty of making life-long lifestyle changes.  Identify current effective and ineffective coping strategies.  Encourage patient and caregiver participation in care to increase self-esteem, confidence and feelings of control.  Consider alternative and complementary therapy approaches such as meditation, mindfulness or yoga.  Encourage  participation in cognitive behavioral therapy to foster a positive identity, increase self-awareness, as well as bolster self-esteem, confidence and self-efficacy.  Discuss spirituality; be present as concerns are identified; encourage journaling, prayer, worship services, meditation or pastoral counseling.  Encourage participation in pleasurable group activities such as hobbies, singing, sports or volunteering).  Encourage the use of mindfulness; refer for training or intensive intervention.  Consider the use of meditative movement therapy such as tai chi, yoga or qigong.  Promote a regular daily exercise program based on tolerance, ability and patient choice to support positive thinking about disease or aging.   Notes:    Task: Support Psychosocial Response to Risk or Actual Health Condition   Due Date: 12/01/2020  Note:   Care Management Activities:    - active listening utilized - healthy lifestyle promoted - problem-solving facilitated - spiritual activities promoted - verbalization of feelings encouraged    Notes:    Patient Care Plan: Wellness (Adult)    Problem Identified: Healthy Nutrition (Wellness)     Long-Range Goal: Healthy Nutrition Achieved   Start Date: 08/26/2020  Expected End Date: 05/02/2021  This Visit's Progress: On track  Note:   Evidence-based guidance:  Assess patient perspective on healthy weight, weight loss or weight gain, motivation and readiness for change.  Recommend or set healthy weight goal based on body mass index.  Review current dietary intake and exercise levels.  Encourage small steps toward making change to eating and exercising.  Provide individualized medical nutrition therapy.   Notes:    Task: Alleviate Barriers to Healthy Eating   Due Date: 12/01/2020  Note:   Care Management Activities:    - incremental change encouraged - making healthy food choices encouraged - praise for success provided    Notes:    Problem Identified:  Medication Adherence (Wellness)   Priority: High  Onset Date: 08/26/2020    Long-Range Goal: Medication Adherence Maintained   Start Date: 08/26/2020  Expected End Date: 05/02/2021  This Visit's Progress: Not on track  Note:   Evidence-based guidance:  Develop a complete and accurate medication list including those prescribed and over-the-counter, those taken only  occasionally and those not taken by mouth such as injections, inhalers, ointments or creams and drops.  Review all medications to determine if patient or caregiver knows why the medications are given and if taken as prescribed.  Complete or review a medication adherence assessment including barriers to medication adherence.  Arrange and encourage counseling and medication review by pharmacist.  Assess barriers to medication adherence.  Manage poor understanding or health literacy by using easy to understand language, teach-back, visual aids and teaching only 2 or 3 points at a time.  Assess presence of side effects; provide suggestions to manage or reduce side effects.  Consult with provider and/or pharmacist regarding substitute medication, changing dose, simplification of regimen or safe discontinuation of some medications.  Encourage the use of medication reminders such as clock or cell phone alarm, color coding, pillboxes for am/pm and days of the week, pharmacy refill reminder, auto-refill system or mail-order option.  Assist with resources when cost is a barrier; refer to prescription assistance programs; confirm that generics are prescribed whenever possible; consider 90-day prescriptions to reduce copay cost; synchronize refills.  Provide help to complete medication assistance applications or health insurance forms as needed.  Complete a follow-up call 2 to 3 weeks after medication self-management plan developed; assess adherence and understanding, as well as listen to patient or caregiver concerns; amend plan as needed.   Provide frequent follow-up providing motivation, encouragement and support when medication nonadherence is identified.   Notes:  This patient has not been taking his medications. Unable to get refilled due to sent to incorrect Dr. Referred to [place]   Task: Optimize Medication Use   Due Date: 12/01/2020  Note:   Care Management Activities:    - barriers to medication adherence identified - medication list reviewed - medication-adherence assessment completed - self-management plan initiated or updated - strategies for improving adherence encouraged - understanding of current medications assessed    Notes:  RN discussed the insulin dosage patient should be taking of his insulin. 36 units in am and 26 units at dinner   Patient Care Plan: Diabetes Type 2 (Adult)    Problem Identified: Glycemic Management (Diabetes, Type 2)     Long-Range Goal: Glycemic Management Optimized   Note:   Evidence-based guidance:  Anticipate A1C testing (point-of-care) every 3 to 6 months based on goal attainment.  Review mutually-set A1C goal or target range.  Anticipate use of antihyperglycemic with or without insulin and periodic adjustments; consider active involvement of pharmacist.  Provide medical nutrition therapy and development of individualized eating.  Compare self-reported symptoms of hypo or hyperglycemia to blood glucose levels, diet and fluid intake, current medications, psychosocial and physiologic stressors, change in activity and barriers to care adherence.  Promote self-monitoring of blood glucose levels.  Assess and address barriers to management plan, such as food insecurity, age, developmental ability, depression, anxiety, fear of hypoglycemia or weight gain, as well as medication cost, side effects and complicated regimen.  Consider referral to community-based diabetes education program, visiting nurse, community health worker or health coach.  Encourage regular dental care for  treatment of periodontal disease; refer to dental provider when needed.   Notes:    Task: Alleviate Barriers to Glycemic Management   Due Date: 10/03/2020  Note:   Care Management Activities:    - A1C testing facilitated - barriers to adherence to treatment plan identified - blood glucose monitoring encouraged - mutual A1C goal set or reviewed - resources required to improve adherence to care identified -  use of blood glucose monitoring log promoted    Notes:    Problem Identified: Disease Progression (Diabetes, Type 2)     Long-Range Goal: Disease Progression Prevented or Minimized   Note:   Evidence-based guidance:  Prepare patient for laboratory and diagnostic exams based on risk and presentation.  Encourage lifestyle changes, such as increased intake of plant-based foods, stress reduction, consistent physical activity and smoking cessation to prevent long-term complications and chronic disease.   Individualize activity and exercise recommendations while considering potential limitations, such as neuropathy, retinopathy or the ability to prevent hyperglycemia or hypoglycemia.   Prepare patient for use of pharmacologic therapy that may include antihypertensive, analgesic, prostaglandin E1 with periodic adjustments, based on presenting chronic condition and laboratory results.  Assess signs/symptoms and risk factors for hypertension, sleep-disordered breathing, neuropathy (including changes in gait and balance), retinopathy, nephropathy and sexual dysfunction.  Address pregnancy planning and contraceptive choice, especially when prescribing antihypertensive or statin.  Ensure completion of annual comprehensive foot exam and dilated eye exam.   Implement additional individualized goals and interventions based on identified risk factors.  Prepare patient for consultation or referral for specialist care, such as ophthalmology, neurology, cardiology, podiatry, nephrology or perinatology.    Notes:    Task: Monitor and Manage Follow-up for Comorbidities   Due Date: 12/01/2020  Note:   Care Management Activities:    - completion of annual dilated eye exam confirmed - completion of annual foot exam verified - healthy lifestyle promoted - reduction of sedentary activity encouraged    Notes:    Patient Care Plan: CCM Pharmacy Care Plan    Problem Identified: Hypertension, Hyperlipidemia, Diabetes, Osteoarthritis and Hypercoaguable state   Priority: High    Long-Range Goal: Disease management   Start Date: 11/17/2020  Expected End Date: 05/17/2021  This Visit's Progress: On track  Priority: High  Note:   Current Barriers:  . Unable to independently monitor therapeutic efficacy . Unable to achieve control of diabetes  . Unable to self administer medications as prescribed  Pharmacist Clinical Goal(s):  Marland Kitchen Over the next 90 days, patient will achieve adherence to monitoring guidelines and medication adherence to achieve therapeutic efficacy . achieve control of diabetes as evidenced by improvement in A1c . achieve ability to self administer medications as prescribed through use of insulin as evidenced by patient report through collaboration with PharmD and provider.   Interventions: . 1:1 collaboration with Charles Lima, MD regarding development and update of comprehensive plan of care as evidenced by provider attestation and co-signature . Inter-disciplinary care team collaboration (see longitudinal plan of care) . Comprehensive medication review performed; medication list updated in electronic medical record  Hypertension (BP goal <130/80) -controlled -Current treatment: . No medications -Medications previously tried: Lexicographer, Bystolic, losartan-HCTZ, valsartan -Denies hypotensive/hypertensive symptoms -Educated on Daily salt intake goal < 2300 mg; Exercise goal of 150 minutes per week; -Counseled to monitor BP at home weekly, document, and provide log at  future appointments -Counseled on diet and exercise extensively  Hyperlipidemia: (LDL goal < 100) -controlled - however pt is out of simvastatin and has not renewed it for several weeks -Current treatment: . Simvastatin 40 mg daily HS -Medications previously tried: ezetimibe -Educated on Cholesterol goals;  Benefits of statin for ASCVD risk reduction; -Recommended to continue current medication Collaborated with PCP to refill simvastatin  Diabetes (A1c goal <8%) -uncontrolled - but improving. Pt reports compliance with meds as prescribed and working on lifestyle modifications. -Dx 2014. Insulin since 2016. -Current medications: .  Glimepiride 4 mg daily  . Farxiga 10 mg daily . Novolog 70/30 36 units AM 26 units PM . Gvoke Hypopen . Accu-Chek glucometer -Medications previously tried: metformin (hives), Lantus, Januvia, Invokana -Current home glucose readings . fasting glucose: 96 . post prandial glucose: 500 -Reports hypoglycemic symptoms - over last week he has felt "off" and BG was < 90, he ate piece of candy and felt better -Current meal patterns: seen nutritionist, trying to "lay off the sweets" -Current exercise: walking most days -Educated onA1c and blood sugar goals; Proper insulin injection technique; Prevention and management of hypoglycemic episodes; Benefits of routine self-monitoring of blood sugar; -Counseled to check feet daily and get yearly eye exams -Recommended to continue current medication Assessed patient finances. He denies issues paying for insulin or Farxiga  Hypercoaguable state/Hx DVT (Goal: prevent subsequent DVT) -controlled - pt will be on Xarelto indefinitely due to Factor VIII elevation -Current treatment  . Xarelto 10 mg daily -Recommended to continue current medication  Osteoarthritis/ hx spine fracture (Goal: manage pain) -controlled - per patient report -Current treatment   . Gabapentin 300 mg BID -Recommended to continue current  medication  Health Maintenance -Vaccine gaps: none -Current therapy: Marland Kitchen Vitamin D 2000 IU daily . Multivitamin -Patient is satisfied with current therapy and denies issues -Recommended to continue current medication Counseled on benefits of Vitamin D given deficiency   Patient Goals/Self-Care Activities . Over the next 90 days, patient will:  - take medications as prescribed focus on medication adherence by establishing routine check glucose 2-4 times daily, document, and provide at future appointments target a minimum of 150 minutes of moderate intensity exercise weekly engage in dietary modifications by following nutritionist's recommendations  Follow Up Plan: Telephone follow up appointment with care management team member scheduled for: 3 months      Medication Assistance: None required.  Patient affirms current coverage meets needs.  Patient's preferred pharmacy is:  [facility] [place], [address] [place] [address] Phone: [phone] Fax: [phone]  Uses pill box? No - prefers bottles Pt endorses 90% compliance - forgets to refill meds sometimes  We discussed: Current pharmacy is preferred with insurance plan and patient is satisfied with pharmacy services Patient decided to: Continue current medication management strategy  Care Plan and Follow Up Patient Decision:  Patient agrees to Care Plan and Follow-up.  Plan: Telephone follow up appointment with care management team member scheduled for:  3 months  Charlene Brooke, PharmD, [facility] Clinical Pharmacist [facility] [phone]

## 2021-02-18 ENCOUNTER — Encounter: Payer: Self-pay | Admitting: Endocrinology

## 2021-02-18 ENCOUNTER — Ambulatory Visit (INDEPENDENT_AMBULATORY_CARE_PROVIDER_SITE_OTHER): Payer: Medicare Other | Admitting: Endocrinology

## 2021-02-18 ENCOUNTER — Other Ambulatory Visit: Payer: Self-pay

## 2021-02-18 VITALS — BP 120/82 | HR 102 | Ht 71.0 in | Wt 194.8 lb

## 2021-02-18 DIAGNOSIS — Z794 Long term (current) use of insulin: Secondary | ICD-10-CM | POA: Diagnosis not present

## 2021-02-18 DIAGNOSIS — E1165 Type 2 diabetes mellitus with hyperglycemia: Secondary | ICD-10-CM | POA: Diagnosis not present

## 2021-02-18 NOTE — Patient Instructions (Addendum)
INSULIN 40 UNITS before Bfst add 20 units before lunch and 24 units before supper  If sugar before supper over 200 do 24 units at lunch

## 2021-02-18 NOTE — Progress Notes (Signed)
3  Patient ID: Charles Parker, male   DOB: 09-26-1946, 75 y.o.   MRN: [id number]          Reason for Appointment: Follow-up for Type 2 Diabetes  Referring physician: Scarlette Calico   History of Present Illness:          Date of diagnosis of type 2 diabetes mellitus: 2014        Background history:   He has been on various oral hypoglycemic drugs since the onset including metformin, Januvia and Amaryl Was also on Invokana in 2015 for about a year but not clear why this was stopped His level of control has been quite variable with highest A1c 13.6 in 06/2015  He has been on insulin since about 10/2014 as documented in his record  Recent history:   INSULIN regimen is:  NovoLog mix 32-40 units before breakfast and 26 units before dinner  Non-insulin hypoglycemic drugs the patient is taking FUX:NATFTDD 10 mg daily  His A1c is back down to 9.7   Current management, blood sugar patterns and problems identified:  He is somewhat confused about how much insulin he is taking and is getting variable answers  He also thinks he may miss his morning insulin dose at least once a week  Although he thinks his blood sugars may be sometimes higher from eating a piece of candy he has fairly consistently high readings during the day and only once below 180 in the morning  He was told to increase his morning insulin up to 42 but not clear if he is taking 32 or 40 units in the morning  He will frequently eat cereal in the morning also  Not able to understand directions for balancing meals with protein   usually eat a sandwich at lunch  Most of his monitoring is before meals and not after and only about every other day at the most  His weight is about the same Again he may walk around block but not able to do significant exercise  Side effects from medications have been: Hives from metformin  Compliance with the medical regimen: Inconsistent  Glucose monitoring:  done< 1 times a   day  usually        Glucometer: Accu-Chek guide       Blood Glucose readings recently and 30-day averages from meter download   PRE-MEAL Fasting Lunch Dinner Bedtime Overall  Glucose range: 172, 214    282   Mean/median:    340   282   POST-MEAL PC Breakfast PC Lunch PC Dinner  Glucose range:   317   Mean/median:  254      Previously:  PRE-MEAL Fasting Lunch Dinner Bedtime Overall  Glucose range:  100-170  203  190, 206    Mean/median:  140    166   POST-MEAL PC Breakfast PC Lunch PC Dinner  Glucose range:    66-217  Mean/median:    185      Self-care: The diet that the patient has been following is: tries to limit [employer] .      Typical meal intake: Breakfast is eggs/cereal or oatmeal usually, lunch:  Lean cuisine or sandwiches.  Dinner chicken and vegetables.   Snacks will be chips, peanut butter crackers Supper at 6 pm                Dietician visit, most recent:3/19               Weight history:  Wt Readings from Last 3 Encounters:  02/18/21 194 lb 12.8 oz (88.4 kg)  02/05/21 194 lb 9.6 oz (88.3 kg)  01/12/21 198 lb 1.6 oz (89.9 kg)    Glycemic control:   Lab Results  Component Value Date   HGBA1C 9.7 (H) 02/05/2021   HGBA1C 13.5 (A) 11/07/2020   HGBA1C 9.6 (H) 08/19/2020   Lab Results  Component Value Date   MICROALBUR <0.7 11/07/2020   LDLCALC 84 08/19/2020   CREATININE 1.07 02/05/2021   Lab Results  Component Value Date   MICRALBCREAT 1.4 11/07/2020    Lab Results  Component Value Date   FRUCTOSAMINE 660 (H) 10/28/2020   FRUCTOSAMINE 361 (H) 07/04/2020   FRUCTOSAMINE 337 (H) 04/30/2019      Allergies as of 02/18/2021      Reactions   Metformin And Related Diarrhea   Codeine Rash   All over the body      Medication List       Accurate as of Feb 18, 2021  1:50 PM. If you have any questions, ask your nurse or doctor.        Alcohol Swabs Pads Use to clean area before insulin injections. DX E11.8   B-D UF III MINI PEN NEEDLES  31G X 5 MM Misc Generic drug: Insulin Pen Needle ADMINISTER 30 UNITS EVERY EVENING   blood glucose meter kit and supplies Kit 1 each by Does not apply route daily as needed. Accu-Chek Aviva preferred by patient. Use up to four times daily as directed. (FOR ICD-9 250.00, 250.01).   Cholecalciferol 50 MCG (2000 UT) Tabs Take 1 tablet (2,000 Units total) by mouth daily.   dapagliflozin propanediol 10 MG Tabs tablet Commonly known as: Farxiga Take 1 tablet (10 mg total) by mouth daily.   FreeStyle Libre 2 Reader [place] 1 Act by Does not apply route daily.   FreeStyle Libre 2 Sensor Misc 1 Act by Does not apply route daily.   gabapentin 300 MG capsule Commonly known as: NEURONTIN TAKE 1 CAPSULE(300 MG) BY MOUTH TWICE DAILY   glimepiride 4 MG tablet Commonly known as: AMARYL Take 1 tablet (4 mg total) by mouth daily before breakfast.   glucose blood test strip Use Accu Chek Aviva Test Strips as instructed to check blood sugar three times daily.   Gvoke HypoPen 2-Pack 1 MG/0.2ML Soaj Generic drug: Glucagon Inject 1 Act into the skin daily as needed.   multivitamin with minerals Tabs tablet Take 1 tablet by mouth daily.   NovoLOG Mix 70/30 FlexPen (70-30) 100 UNIT/ML FlexPen Generic drug: insulin aspart protamine - aspart Inject 20 units under the skin breakfast and 26 units at dinner.   rivaroxaban 10 MG Tabs tablet Commonly known as: Xarelto Take 1 tablet (10 mg total) by mouth daily.   simvastatin 40 MG tablet Commonly known as: ZOCOR TAKE 1 TABLET(40 MG) BY MOUTH AT BEDTIME       Allergies:  Allergies  Allergen Reactions  . Metformin And Related Diarrhea  . Codeine Rash    All over the body    Past Medical History:  Diagnosis Date  . Clotting disorder (HCC)    DVT  . Diabetes mellitus    type II  . Diverticulosis   . DVT (deep venous thrombosis) ([place])   . Glaucoma    no eye drops   . Hepatic cyst   . Hypercholesterolemia   . Hypertension   . Renal  cyst   . Sleep apnea   . Tubulovillous adenoma  Past Surgical History:  Procedure Laterality Date  . BLADDER NECK RECONSTRUCTION  02/03/2012   Procedure: BLADDER NECK REPAIR;  Surgeon: Adin Hector, MD;  Location: [facility] ORS;  Service: General;;  . COLONOSCOPY  02/2013   polyp removal(mult.)  . KNEE SURGERY  2004   left  . POLYPECTOMY    . PROCTOSCOPY  02/03/2012   Procedure: PROCTOSCOPY;  Surgeon: Adin Hector, MD;  Location: [facility] ORS;  Service: General;;  . STOMACH SURGERY  02/2012  . TEE WITHOUT CARDIOVERSION N/A 10/05/2016   Procedure: TRANSESOPHAGEAL ECHOCARDIOGRAM (TEE);  Surgeon: Sanda Klein, MD;  Location: [facility] ENDOSCOPY;  Service: Cardiovascular;  Laterality: N/A;    Family History  Problem Relation Age of Onset  . Hypertension Mother   . Diabetes Mother   . Cancer Mother        ? location  . Colon cancer Father 37  . Colon polyps Neg Hx   . Liver cancer Neg Hx     Social History:  reports that he has never smoked. He has never used smokeless tobacco. He reports that he does not drink alcohol and does not use drugs.   Review of Systems   Lipid history: Has been taking Zocor 40 mg with variable control, followed by PCP    Lab Results  Component Value Date   CHOL 153 08/19/2020   CHOL 202 (H) 05/12/2020   CHOL 195 03/05/2019   Lab Results  Component Value Date   HDL 43.00 08/19/2020   HDL 50.80 05/12/2020   HDL 46.40 03/05/2019   Lab Results  Component Value Date   LDLCALC 84 08/19/2020   LDLCALC 124 (H) 05/12/2020   LDLCALC 119 (H) 03/05/2019   Lab Results  Component Value Date   TRIG 130.0 08/19/2020   TRIG 134.0 05/12/2020   TRIG 152.0 (H) 03/05/2019   Lab Results  Component Value Date   CHOLHDL 4 08/19/2020   CHOLHDL 4 05/12/2020   CHOLHDL 4 03/05/2019   Lab Results  Component Value Date   LDLDIRECT 115.0 07/02/2019   LDLDIRECT 118.0 11/11/2015   LDLDIRECT 201.0 03/04/2015           He has history of neuropathy taking gabapentin  prescribed by PCP  Most recent foot exam: 2/22  RENAL function is consistently normal with using Farxiga 10 mg  Lab Results  Component Value Date   CREATININE 1.07 02/05/2021   CREATININE 1.10 11/07/2020   CREATININE 1.27 10/28/2020    Blood pressure generally controlled without any medications  BP Readings from Last 3 Encounters:  02/18/21 120/82  02/05/21 134/72  12/15/20 138/80    Has had all 3 Covid shots  May have diabetic shoes for his foot deformities  Physical Examination:  BP 120/82 (BP Location: Left Arm, Patient Position: Sitting, Cuff Size: Normal)   Pulse (!) 102   Ht _0  (1.803 m)   Wt 194 lb 12.8 oz (88.4 kg)   SpO2 99%   BMI 27.17 kg/m       ASSESSMENT:  Diabetes type 2 on insulin  See history of present illness for detailed discussion of current diabetes management, blood sugar patterns and problems identified   He is on a regimen of premixed insulin twice a day and Farxiga  His A1c is recently better at 9.7  However his recent blood sugars are averaging about 280 with relatively high readings postprandially May be going up on his blood sugars more significantly after lunch when he is eating sandwiches Also appears  to be not consistent with the morning shot He likely can do better with more diabetes education especially nutrition counseling    PLAN:    Referral made for diabetes education including nutritional counseling He will start writing down his blood sugar and insulin doses on the log sheet provided today Insulin instructions written down on this log sheet also For now he will stay with 40 units of insulin in the morning which he likely is taking at least sometimes but I am another 20 units before lunch As a precaution we will reduce his suppertime dose to 25 units Stay on [place] daily Needs short-term follow-up to make sure he is getting better control     Patient Instructions  INSULIN 40 UNITS before Bfst add 20  units before lunch and 24 units before supper  If sugar before supper over 200 do 24 units at lunch      Elayne Snare 02/18/2021, 1:50 PM   Note: This office note was prepared with Dragon voice recognition system technology. Any transcriptional errors that result from this process are unintentional.

## 2021-02-27 ENCOUNTER — Encounter: Payer: Self-pay | Admitting: Registered"

## 2021-02-27 ENCOUNTER — Other Ambulatory Visit: Payer: Self-pay

## 2021-02-27 ENCOUNTER — Encounter: Payer: Medicare Other | Attending: Internal Medicine | Admitting: Registered"

## 2021-02-27 DIAGNOSIS — E118 Type 2 diabetes mellitus with unspecified complications: Secondary | ICD-10-CM | POA: Diagnosis not present

## 2021-02-27 DIAGNOSIS — Z794 Long term (current) use of insulin: Secondary | ICD-10-CM

## 2021-02-27 NOTE — Patient Instructions (Addendum)
Ideas to stretch your food dollar and still get good nutrition: Tangerines, eggs, consider getting oats in the large container.  Consider trying overnight oats.  Consider using your food stamp benefits at [place] that doubles your benefits to get fresh produce.  Continue taking medication as directed.  Continue not skipping meals. Important with some of the medications you are on.

## 2021-02-27 NOTE — Progress Notes (Signed)
Diabetes Self-Management Education  Visit Type: Follow-up  Appt. Start Time: 1055 Appt. End Time: 1130  02/27/2021  [name], identified by name and date of birth, is a 75 y.o. male with a diagnosis of Diabetes: Type 2.   ASSESSMENT  There were no vitals taken for this visit. There is no height or weight on file to calculate BMI.  A1c 9.7% up 0.1% from 6 months ago.   Medications per epic: Farxiga, glimepiride 4 mg before breakfast, insulin 70/30 20 units with breakfast 26 units with dinner  Pt reports he is taking all medications as directed. Pt states Dr. Dwyane Dee has increased is 70/30 insulin from 36 units to 40 units in the morning and continues 26 units at dinner.  SMBG: Pt reports the following readings: FBS & pre-meal 150, 162, 167, 170 Once was over 200 when he ate something sweet for dinner  Pt states he is writing his blood sugar in log but did not bring to this appointment. Pt states at his next appt with Dr. Dwyane Dee in June or July will take BG log.   Pt states he is not drinking juice because he ran out and doesn't have enough money to get more. Pt states he does not want to beg for money, but needs money. Pt states he has food stamps and usually has enough for food, but sometimes gets uses resources such as at the [employer] fellowship. Pt states he likes flip top cans because his can opener doesn't work.   Pt states his routine includes walking in the afternoon, eating dinner around 6 pm and watches sports in the evening. Pt states he usually doesn't eat again until morning.   Diabetes Self-Management Education - 02/27/21 1000      Visit Information   Visit Type Follow-up      Initial Visit   Diabetes Type Type 2    Are you taking your medications as prescribed? Yes      Dietary Intake   Breakfast cheerios, sausage biscuit, water    Lunch pancake sausage    Snack (afternoon) crackers    Dinner chicken, rice    Snack (evening) crackers & peanut butter     Beverage(s) water, OJ sometimes      Outcomes   Expected Outcomes Demonstrated interest in learning. Expect positive outcomes    Future DMSE 4-6 wks    Program Status Not Completed           Individualized Plan for Diabetes Self-Management Training:   Learning Objective:  Patient will have a greater understanding of diabetes self-management. Patient education plan is to attend individual and/or group sessions per assessed needs and concerns.   Patient Instructions  Ideas to stretch your food dollar and still get good nutrition: Tangerines, eggs, consider getting oats in the large container.  Consider trying overnight oats.  Consider using your food stamp benefits at [place] that doubles your benefits to get fresh produce.  Continue taking medication as directed.  Continue not skipping meals. Important with some of the medications you are on.   Expected Outcomes:  Demonstrated interest in learning. Expect positive outcomes  Education material provided: overnight oats instructions  If problems or questions, patient to contact team via:  Phone and MyChart  Future DSME appointment: 4-6 wks

## 2021-03-03 ENCOUNTER — Telehealth: Payer: Self-pay | Admitting: Pharmacist

## 2021-03-03 NOTE — Progress Notes (Signed)
    Chronic Care Management Pharmacy Assistant   Name: Charles Parker  MRN: [id number] DOB: [birth year]   Reason for Encounter: Chart Review    Recent office visits:  02/05/21 Dr. [name] insulin aspart prot & aspart (70-30) 100 unit/ml 02/18/21 Dr. Elayne Snare  Recent consult visits:  None ID  Hospital visits:  None in previous 6 months  Medications: Outpatient Encounter Medications as of 03/03/2021  Medication Sig  . Alcohol Swabs PADS Use to clean area before insulin injections. DX E11.8  . blood glucose meter kit and supplies KIT 1 each by Does not apply route daily as needed. Accu-Chek Aviva preferred by patient. Use up to four times daily as directed. (FOR ICD-9 250.00, 250.01).  . Cholecalciferol 50 MCG (2000 UT) TABS Take 1 tablet (2,000 Units total) by mouth daily.  . Continuous Blood Gluc Receiver (FREESTYLE LIBRE 2 READER) DEVI 1 Act by Does not apply route daily.  . Continuous Blood Gluc Sensor (FREESTYLE LIBRE 2 SENSOR) MISC 1 Act by Does not apply route daily.  . dapagliflozin propanediol (FARXIGA) 10 MG TABS tablet Take 1 tablet (10 mg total) by mouth daily.  Marland Kitchen gabapentin (NEURONTIN) 300 MG capsule TAKE 1 CAPSULE(300 MG) BY MOUTH TWICE DAILY  . glimepiride (AMARYL) 4 MG tablet Take 1 tablet (4 mg total) by mouth daily before breakfast.  . Glucagon (GVOKE HYPOPEN 2-PACK) 1 MG/0.2ML SOAJ Inject 1 Act into the skin daily as needed.  Marland Kitchen glucose blood test strip Use Accu Chek Aviva Test Strips as instructed to check blood sugar three times daily.  . insulin aspart protamine - aspart (NOVOLOG MIX 70/30 FLEXPEN) (70-30) 100 UNIT/ML FlexPen Inject 20 units under the skin breakfast and 26 units at dinner. (Patient taking differently: Inject 20 units under the skin breakfast and 26 units at dinner.)  . Insulin Pen Needle (B-D UF III MINI PEN NEEDLES) 31G X 5 MM MISC ADMINISTER 30 UNITS EVERY EVENING  . Multiple Vitamin (MULTIVITAMIN WITH MINERALS) TABS tablet Take 1 tablet by mouth  daily.  . rivaroxaban (XARELTO) 10 MG TABS tablet Take 1 tablet (10 mg total) by mouth daily.  . simvastatin (ZOCOR) 40 MG tablet TAKE 1 TABLET(40 MG) BY MOUTH AT BEDTIME   No facility-administered encounter medications on file as of 03/03/2021.    Pharmacist Review  Reviewed chart for medication changes and adherence.  Recent OV, Consult or Hospital visit:  Recent medication changes indicated:   No gaps in adherence identified. Patient has follow up scheduled with pharmacy team. No further action required.  [place] Pharmacist Assistant [phone]  Time spent:9

## 2021-04-08 ENCOUNTER — Other Ambulatory Visit: Payer: Self-pay | Admitting: Internal Medicine

## 2021-04-08 DIAGNOSIS — N3281 Overactive bladder: Secondary | ICD-10-CM

## 2021-04-08 DIAGNOSIS — Z794 Long term (current) use of insulin: Secondary | ICD-10-CM

## 2021-04-08 DIAGNOSIS — B3742 Candidal balanitis: Secondary | ICD-10-CM

## 2021-04-08 DIAGNOSIS — E1165 Type 2 diabetes mellitus with hyperglycemia: Secondary | ICD-10-CM

## 2021-04-08 DIAGNOSIS — D6859 Other primary thrombophilia: Secondary | ICD-10-CM

## 2021-04-09 ENCOUNTER — Other Ambulatory Visit: Payer: Self-pay | Admitting: Internal Medicine

## 2021-04-09 DIAGNOSIS — Z794 Long term (current) use of insulin: Secondary | ICD-10-CM

## 2021-04-09 DIAGNOSIS — E1165 Type 2 diabetes mellitus with hyperglycemia: Secondary | ICD-10-CM

## 2021-04-09 DIAGNOSIS — D6859 Other primary thrombophilia: Secondary | ICD-10-CM

## 2021-04-09 MED ORDER — RIVAROXABAN 10 MG PO TABS: 10.0000 mg | ORAL_TABLET | Freq: Every day | ORAL | 1 refills | Status: DC

## 2021-04-09 MED ORDER — GLIMEPIRIDE 4 MG PO TABS: 4.0000 mg | ORAL_TABLET | Freq: Every day | ORAL | 0 refills | Status: DC

## 2021-04-10 ENCOUNTER — Ambulatory Visit: Payer: Medicare Other | Admitting: Registered"

## 2021-04-15 ENCOUNTER — Encounter: Payer: Self-pay | Admitting: Podiatry

## 2021-04-15 ENCOUNTER — Other Ambulatory Visit: Payer: Self-pay

## 2021-04-15 ENCOUNTER — Ambulatory Visit (INDEPENDENT_AMBULATORY_CARE_PROVIDER_SITE_OTHER): Payer: Medicare Other | Admitting: Podiatry

## 2021-04-15 ENCOUNTER — Other Ambulatory Visit (INDEPENDENT_AMBULATORY_CARE_PROVIDER_SITE_OTHER): Payer: Medicare Other

## 2021-04-15 DIAGNOSIS — Z794 Long term (current) use of insulin: Secondary | ICD-10-CM

## 2021-04-15 DIAGNOSIS — M2012 Hallux valgus (acquired), left foot: Secondary | ICD-10-CM

## 2021-04-15 DIAGNOSIS — E1151 Type 2 diabetes mellitus with diabetic peripheral angiopathy without gangrene: Secondary | ICD-10-CM | POA: Diagnosis not present

## 2021-04-15 DIAGNOSIS — M205X2 Other deformities of toe(s) (acquired), left foot: Secondary | ICD-10-CM

## 2021-04-15 DIAGNOSIS — B351 Tinea unguium: Secondary | ICD-10-CM

## 2021-04-15 DIAGNOSIS — E1165 Type 2 diabetes mellitus with hyperglycemia: Secondary | ICD-10-CM

## 2021-04-15 DIAGNOSIS — M2011 Hallux valgus (acquired), right foot: Secondary | ICD-10-CM

## 2021-04-15 LAB — HEMOGLOBIN A1C: Hgb A1c MFr Bld: 9.4 % — ABNORMAL HIGH (ref 4.6–6.5)

## 2021-04-15 LAB — BASIC METABOLIC PANEL
BUN: 11 mg/dL (ref 6–23)
CO2: 25 mEq/L (ref 19–32)
Calcium: 9.5 mg/dL (ref 8.4–10.5)
Chloride: 107 mEq/L (ref 96–112)
Creatinine, Ser: 0.99 mg/dL (ref 0.40–1.50)
GFR: 74.72 mL/min (ref >60.00)
Glucose, Bld: 210 mg/dL — ABNORMAL HIGH (ref 70–99)
Potassium: 3.9 mEq/L (ref 3.5–5.1)
Sodium: 142 mEq/L (ref 135–145)

## 2021-04-15 NOTE — Progress Notes (Signed)
This patient returns to my office for at risk foot care.  This patient requires this care by a professional since this patient will be at risk due to having diabetes type 2 and coagulation defect.  Patient is taking xarelto.  This patient is unable to cut nails himself since the patient cannot reach his nails.These nails are painful walking and wearing shoes.  This patient presents for at risk foot care today.  General Appearance  Alert, conversant and in no acute stress.  Vascular  Dorsalis pedis and posterior tibial  pulses are weakly palpable  bilaterally.  Capillary return is within normal limits  bilaterally. Temperature is within normal limits  bilaterally.  Neurologic  Senn-Weinstein monofilament wire test within normal limits  bilaterally. Muscle power within normal limits bilaterally.  Nails Thick disfigured discolored nails with subungual debris  from hallux to fifth toes bilaterally.  Orthopedic  No limitations of motion  feet .  No crepitus or effusions noted.  HAV with overlapping second digit  B/L.  Skin  normotropic skin with no porokeratosis noted bilaterally.  No signs of infections or ulcers noted.     Onychomycosis  Pain in right toes  Pain in left toes  Consent was obtained for treatment procedures.   Mechanical debridement of nails 1-5  bilaterally performed with a nail nipper.  Filed with dremel without incident.  Patient qualifies for diabetic shoes due to diabetes ,HAV and hammer toes  B/L. Dr.  Dwyane Dee denied his shoes.   Return office visit     3 months                Told patient to return for periodic foot care and evaluation due to potential at risk complications.   Gardiner Barefoot DPM

## 2021-04-16 ENCOUNTER — Other Ambulatory Visit: Payer: Medicare Other

## 2021-04-20 ENCOUNTER — Other Ambulatory Visit: Payer: Self-pay

## 2021-04-20 ENCOUNTER — Ambulatory Visit: Payer: Medicare Other | Admitting: Endocrinology

## 2021-04-27 ENCOUNTER — Ambulatory Visit (INDEPENDENT_AMBULATORY_CARE_PROVIDER_SITE_OTHER): Payer: Medicare Other | Admitting: Endocrinology

## 2021-04-27 ENCOUNTER — Other Ambulatory Visit: Payer: Self-pay

## 2021-04-27 ENCOUNTER — Encounter: Payer: Self-pay | Admitting: Endocrinology

## 2021-04-27 VITALS — BP 130/88 | HR 96 | Ht 71.0 in | Wt 197.2 lb

## 2021-04-27 DIAGNOSIS — E1165 Type 2 diabetes mellitus with hyperglycemia: Secondary | ICD-10-CM

## 2021-04-27 DIAGNOSIS — Z794 Long term (current) use of insulin: Secondary | ICD-10-CM | POA: Diagnosis not present

## 2021-04-27 DIAGNOSIS — E118 Type 2 diabetes mellitus with unspecified complications: Secondary | ICD-10-CM | POA: Diagnosis not present

## 2021-04-27 NOTE — Patient Instructions (Addendum)
Check blood sugars on waking up 3-4  days a week  Also check blood sugars about 2 hours after meals and do this after different meals by rotation  Recommended blood sugar levels on waking up are 90-130 and about 2 hours after meal is 130-160  Please bring your blood sugar monitor to each visit, thank you  Take same dose INSULIN BEFORE BFST AND SUPPER DAILY and keep a record

## 2021-04-27 NOTE — Progress Notes (Signed)
3  Patient ID: Charles Parker, male   DOB: 1946/05/27, 75 y.o.   MRN: [id number]          Reason for Appointment: Follow-up for Type 2 Diabetes  Referring physician: Scarlette Calico   History of Present Illness:          Date of diagnosis of type 2 diabetes mellitus: 2014        Background history:   He has been on various oral hypoglycemic drugs since the onset including metformin, Januvia and Amaryl Was also on Invokana in 2015 for about a year but not clear why this was stopped His level of control has been quite variable with highest A1c 13.6 in 06/2015  He has been on insulin since about 10/2014 as documented in his record  Recent history:   INSULIN regimen is:  NovoLog mix 38-40 units before breakfast and 28 units before dinner  Non-insulin hypoglycemic drugs the patient is taking BZJ:IRCVELF 10 mg daily  His A1c is only slightly better at 9.4   Current management, blood sugar patterns and problems identified: He is still not able to recall how much insulin he is taking As before receiving variable answers about his doses He also thinks he may miss his insulin doses 1 or 2 days a week possibly from stress or family issues He has also cut back on checking his blood sugar and has only about 4 readings in the last month, all in the morning  However lab glucose was 210 later in the day He thinks he is taking his [place] regularly in the morning He was seen by the nutritionist in May and not clear if any changes were made in his meal planning on a daily routine No symptoms of hypoglycemia His weight is weekly higher Tries to walk block but not able to do significant exercise especially when he has knee pain  Side effects from medications have been: Hives from metformin  Compliance with the medical regimen: Inconsistent  Glucose monitoring:  done< 1 times a   day usually        Glucometer: Accu-Chek guide       Blood Glucose readings recently   Fasting range 123-185,  median 151  Previous data:  PRE-MEAL Fasting Lunch Dinner Bedtime Overall  Glucose range: 172, 214    282   Mean/median:    340   282   POST-MEAL PC Breakfast PC Lunch PC Dinner  Glucose range:   317   Mean/median:  254         Self-care: The diet that the patient has been following is: tries to limit [employer] .      Typical meal intake: Breakfast is eggs/cereal or oatmeal usually, lunch:  Lean cuisine or sandwiches.  Dinner chicken and vegetables.   Snacks will be chips, peanut butter crackers Supper at 6 pm                Dietician visit, most recent:3/19               Weight history:  Wt Readings from Last 3 Encounters:  04/27/21 197 lb 3.2 oz (89.4 kg)  02/18/21 194 lb 12.8 oz (88.4 kg)  02/05/21 194 lb 9.6 oz (88.3 kg)    Glycemic control:   Lab Results  Component Value Date   HGBA1C 9.4 (H) 04/15/2021   HGBA1C 9.7 (H) 02/05/2021   HGBA1C 13.5 (A) 11/07/2020   Lab Results  Component Value Date   MICROALBUR <  0.7 11/07/2020   LDLCALC 84 08/19/2020   CREATININE 0.99 04/15/2021   Lab Results  Component Value Date   MICRALBCREAT 1.4 11/07/2020    Lab Results  Component Value Date   FRUCTOSAMINE 660 (H) 10/28/2020   FRUCTOSAMINE 361 (H) 07/04/2020   FRUCTOSAMINE 337 (H) 04/30/2019      Allergies as of 04/27/2021       Reactions   Metformin And Related Diarrhea   Codeine Rash   All over the body        Medication List        Accurate as of April 27, 2021  1:22 PM. If you have any questions, ask your nurse or doctor.          Alcohol Swabs Pads Use to clean area before insulin injections. DX E11.8   B-D UF III MINI PEN NEEDLES 31G X 5 MM Misc Generic drug: Insulin Pen Needle ADMINISTER 30 UNITS EVERY EVENING   blood glucose meter kit and supplies Kit 1 each by Does not apply route daily as needed. Accu-Chek Aviva preferred by patient. Use up to four times daily as directed. (FOR ICD-9 250.00, 250.01).   Cholecalciferol 50 MCG  (2000 UT) Tabs Take 1 tablet (2,000 Units total) by mouth daily.   dapagliflozin propanediol 10 MG Tabs tablet Commonly known as: Farxiga Take 1 tablet (10 mg total) by mouth daily.   FreeStyle Libre 2 Reader [place] 1 Act by Does not apply route daily.   FreeStyle Libre 2 Sensor Misc 1 Act by Does not apply route daily.   gabapentin 300 MG capsule Commonly known as: NEURONTIN TAKE 1 CAPSULE(300 MG) BY MOUTH TWICE DAILY   glimepiride 4 MG tablet Commonly known as: AMARYL Take 1 tablet (4 mg total) by mouth daily before breakfast.   glucose blood test strip Use Accu Chek Aviva Test Strips as instructed to check blood sugar three times daily.   Gvoke HypoPen 2-Pack 1 MG/0.2ML Soaj Generic drug: Glucagon Inject 1 Act into the skin daily as needed.   multivitamin with minerals Tabs tablet Take 1 tablet by mouth daily.   NovoLOG Mix 70/30 FlexPen (70-30) 100 UNIT/ML FlexPen Generic drug: insulin aspart protamine - aspart Inject 20 units under the skin breakfast and 26 units at dinner.   rivaroxaban 10 MG Tabs tablet Commonly known as: Xarelto Take 1 tablet (10 mg total) by mouth daily.   simvastatin 40 MG tablet Commonly known as: ZOCOR TAKE 1 TABLET(40 MG) BY MOUTH AT BEDTIME        Allergies:  Allergies  Allergen Reactions   Metformin And Related Diarrhea   Codeine Rash    All over the body    Past Medical History:  Diagnosis Date   Clotting disorder ([place])    DVT   Diabetes mellitus    type II   Diverticulosis    DVT (deep venous thrombosis) (HCC)    Glaucoma    no eye drops    Hepatic cyst    Hypercholesterolemia    Hypertension    Renal cyst    Sleep apnea    Tubulovillous adenoma     Past Surgical History:  Procedure Laterality Date   BLADDER NECK RECONSTRUCTION  02/03/2012   Procedure: BLADDER NECK REPAIR;  Surgeon: Adin Hector, MD;  Location: [facility];  Service: General;;   COLONOSCOPY  02/2013   polyp removal(mult.)   KNEE SURGERY  2004    left   POLYPECTOMY     PROCTOSCOPY  02/03/2012  Procedure: PROCTOSCOPY;  Surgeon: Adin Hector, MD;  Location: [facility];  Service: General;;   STOMACH SURGERY  02/2012   TEE WITHOUT CARDIOVERSION N/A 10/05/2016   Procedure: TRANSESOPHAGEAL ECHOCARDIOGRAM (TEE);  Surgeon: Sanda Klein, MD;  [facility];  Service: Cardiovascular;  Laterality: N/A;    Family History  Problem Relation Age of Onset   Hypertension Mother    Diabetes Mother    Cancer Mother        ? location   Colon cancer Father 8   Colon polyps Neg Hx    Liver cancer Neg Hx     Social History:  reports that he has never smoked. He has never used smokeless tobacco. He reports that he does not drink alcohol and does not use drugs.   Review of Systems   Lipid history: Has been taking simvastatin 40 mg with variable control, followed by PCP    Lab Results  Component Value Date   CHOL 153 08/19/2020   CHOL 202 (H) 05/12/2020   CHOL 195 03/05/2019   Lab Results  Component Value Date   HDL 43.00 08/19/2020   HDL 50.80 05/12/2020   HDL 46.40 03/05/2019   Lab Results  Component Value Date   LDLCALC 84 08/19/2020   LDLCALC 124 (H) 05/12/2020   LDLCALC 119 (H) 03/05/2019   Lab Results  Component Value Date   TRIG 130.0 08/19/2020   TRIG 134.0 05/12/2020   TRIG 152.0 (H) 03/05/2019   Lab Results  Component Value Date   CHOLHDL 4 08/19/2020   CHOLHDL 4 05/12/2020   CHOLHDL 4 03/05/2019   Lab Results  Component Value Date   LDLDIRECT 115.0 07/02/2019   LDLDIRECT 118.0 11/11/2015   LDLDIRECT 201.0 03/04/2015           He has history of neuropathy taking gabapentin prescribed by PCP  Most recent foot exam: 2/22  RENAL function is again normal with continuing Farxiga 10 mg  Lab Results  Component Value Date   CREATININE 0.99 04/15/2021   CREATININE 1.07 02/05/2021   CREATININE 1.10 11/07/2020    Blood pressure fairly well controlled without any medications  BP Readings from Last 3  Encounters:  04/27/21 130/88  02/18/21 120/82  02/05/21 134/72    Has had all 3 Covid shots  May have diabetic shoes for his foot deformities  Physical Examination:  BP 130/88   Pulse 96   Ht 5' 11" (1.803 m)   Wt 197 lb 3.2 oz (89.4 kg)   SpO2 96%   BMI 27.50 kg/m       ASSESSMENT:  Diabetes type 2 on insulin  See history of present illness for detailed discussion of current diabetes management, blood sugar patterns and problems identified   He is on a regimen of premixed insulin twice a day and Farxiga  His A1c is still significantly high at 9.4  Although his fasting blood sugars are relatively better he has checked only sporadically He had a reading of 210 nonfasting Likely is missing his insulin doses regularly He is also likely not taking consistent amounts of insulin and appears to be taking arbitrary doses He was given a log sheet to keep a record and he did not do so last time    PLAN:    He will make sure he is checking his blood sugars after meals and indicated when to check these, these were marked in the diary given to him He will start writing down his blood sugar readings  and insulin doses blood sugar in the diary given Reminded him to continue steady dose of 40 units of insulin in the morning and 26 units before dinner  Able to write down his insulin dose every time he takes it  Follow-up in 2 months     There are no Patient Instructions on file for this visit.      Elayne Snare 04/27/2021, 1:22 PM   Note: This office note was prepared with Dragon voice recognition system technology. Any transcriptional errors that result from this process are unintentional.

## 2021-05-07 ENCOUNTER — Other Ambulatory Visit: Payer: Self-pay

## 2021-05-07 ENCOUNTER — Ambulatory Visit (INDEPENDENT_AMBULATORY_CARE_PROVIDER_SITE_OTHER): Payer: Medicare Other

## 2021-05-07 VITALS — BP 140/80 | HR 76 | Temp 98.1°F | Ht 71.0 in | Wt 195.6 lb

## 2021-05-07 DIAGNOSIS — Z0000 Encounter for general adult medical examination without abnormal findings: Secondary | ICD-10-CM

## 2021-05-07 NOTE — Progress Notes (Signed)
Subjective:   Charles Parker is a 75 y.o. male who presents for Medicare Annual/Subsequent preventive examination.  Review of Systems     Cardiac Risk Factors include: advanced age (>42mn, >>19women);diabetes mellitus;dyslipidemia;family history of premature cardiovascular disease;hypertension;male gender     Objective:    Today's Vitals   05/07/21 1237  BP: 140/80  Pulse: 76  Temp: 98.1 F (36.7 C)  SpO2: 93%  Weight: 195 lb 9.6 oz (88.7 kg)  Height: '5\' 11"'  (1.803 m)  PainSc: 0-No pain   Body mass index is 27.28 kg/m.  Advanced Directives 05/07/2021 11/12/2020 05/24/2019 04/18/2019 03/30/2019 01/04/2018 12/19/2017  Does Patient Have a Medical Advance Directive? Yes Yes Yes No No No Yes  Type of Advance Directive Living will - - - - - -  Does patient want to make changes to medical advance directive? No - Patient declined No - Patient declined - - - - -  Copy of HPress photographerin Chart? - - - - - - -  Would patient like information on creating a medical advance directive? - - - No - Patient declined No - Patient declined Yes (ED - Information included in AVS) -  Pre-existing out of facility DNR order (yellow form or pink MOST form) - - - - - - -    Current Medications (verified) Outpatient Encounter Medications as of 05/07/2021  Medication Sig   Alcohol Swabs PADS Use to clean area before insulin injections. DX E11.8   blood glucose meter kit and supplies KIT 1 each by Does not apply route daily as needed. Accu-Chek Aviva preferred by patient. Use up to four times daily as directed. (FOR ICD-9 250.00, 250.01).   Cholecalciferol 50 MCG (2000 UT) TABS Take 1 tablet (2,000 Units total) by mouth daily.   Continuous Blood Gluc Receiver (FREESTYLE LIBRE 2 READER) [place] 1 Act by Does not apply route daily.   Continuous Blood Gluc Sensor (FREESTYLE LIBRE 2 SENSOR) MISC 1 Act by Does not apply route daily.   dapagliflozin propanediol (FARXIGA) 10 MG TABS tablet Take 1 tablet  (10 mg total) by mouth daily.   gabapentin (NEURONTIN) 300 MG capsule TAKE 1 CAPSULE(300 MG) BY MOUTH TWICE DAILY   glimepiride (AMARYL) 4 MG tablet Take 1 tablet (4 mg total) by mouth daily before breakfast.   Glucagon (GVOKE HYPOPEN 2-PACK) 1 MG/0.2ML SOAJ Inject 1 Act into the skin daily as needed.   glucose blood test strip Use Accu Chek Aviva Test Strips as instructed to check blood sugar three times daily.   insulin aspart protamine - aspart (NOVOLOG MIX 70/30 FLEXPEN) (70-30) 100 UNIT/ML FlexPen Inject 20 units under the skin breakfast and 26 units at dinner. (Patient taking differently: Inject 20 units under the skin breakfast and 26 units at dinner.)   Insulin Pen Needle (B-D UF III MINI PEN NEEDLES) 31G X 5 MM MISC ADMINISTER 30 UNITS EVERY EVENING   Multiple Vitamin (MULTIVITAMIN WITH MINERALS) TABS tablet Take 1 tablet by mouth daily.   rivaroxaban (XARELTO) 10 MG TABS tablet Take 1 tablet (10 mg total) by mouth daily.   simvastatin (ZOCOR) 40 MG tablet TAKE 1 TABLET(40 MG) BY MOUTH AT BEDTIME   No facility-administered encounter medications on file as of 05/07/2021.    Allergies (verified) Metformin and related and Codeine   History: Past Medical History:  Diagnosis Date   [place]    DVT   Diabetes mellitus    type II   Diverticulosis  DVT (deep venous thrombosis) (HCC)    Glaucoma    no eye drops    Hepatic cyst    Hypercholesterolemia    Hypertension    Renal cyst    Sleep apnea    Tubulovillous adenoma    Past Surgical History:  Procedure Laterality Date   BLADDER NECK RECONSTRUCTION  02/03/2012   Procedure: BLADDER NECK REPAIR;  Surgeon: Adin Hector, MD;  Location: [facility];  Service: General;;   COLONOSCOPY  02/2013   polyp removal(mult.)   KNEE SURGERY  2004   left   POLYPECTOMY     PROCTOSCOPY  02/03/2012   Procedure: PROCTOSCOPY;  Surgeon: Adin Hector, MD;  Location: [facility];  Service: General;;   STOMACH SURGERY  02/2012   TEE  WITHOUT CARDIOVERSION N/A 10/05/2016   Procedure: TRANSESOPHAGEAL ECHOCARDIOGRAM (TEE);  Surgeon: Sanda Klein, MD;  Location[facility] ENDOSCOPY;  Service: Cardiovascular;  Laterality: N/A;   Family History  Problem Relation Age of Onset   Hypertension Mother    Diabetes Mother    Cancer Mother        ? location   Colon cancer Father 62   Colon polyps Neg Hx    Liver cancer Neg Hx    Social History   Socioeconomic History   Marital status: Widowed    Spouse name: Not on file   Number of children: 2   Years of education: 57   Highest education level: Not on file  Occupational History   Occupation: retired    Comment: coal mills  Tobacco Use   Smoking status: Never   Smokeless tobacco: Never  Scientific laboratory technician Use: Never used  Substance and Sexual Activity   Alcohol use: No    Alcohol/week: 0.0 standard drinks    Comment: once every two months   Drug use: No   Sexual activity: Not Currently  Other Topics Concern   Not on file  Social History Narrative   Patient lives at home alone.. Patient is retired. Patient has high school education.   Right handed.- Both   Caffeine- Coffee and soda   Social Determinants of Health   Financial Resource Strain: Low Risk    Difficulty of Paying Living Expenses: Not hard at all  Food Insecurity: No Food Insecurity   Worried About Charity fundraiser in the Last Year: Never true   Ran Out of Food in the Last Year: Never true  Transportation Needs: No Transportation Needs   Lack of Transportation (Medical): No   Lack of Transportation (Non-Medical): No  Physical Activity: Sufficiently Active   Days of Exercise per Week: 5 days   Minutes of Exercise per Session: 30 min  Stress: No Stress Concern Present   Feeling of Stress : Not at all  Social Connections: Moderately Integrated   Frequency of Communication with Friends and Family: More than three times a week   Frequency of Social Gatherings with Friends and Family: More than three  times a week   Attends Religious Services: More than 4 times per year   Active Member of [employer] or Organizations: Yes   Attends Archivist Meetings: More than 4 times per year   Marital Status: Widowed    Tobacco Counseling Counseling given: Not Answered   Clinical Intake:  Pre-visit preparation completed: Yes  Pain : No/denies pain Pain Score: 0-No pain     BMI - recorded: 27.28 Nutritional Status: BMI 25 -29 Overweight Nutritional Risks: None Diabetes: Yes CBG  done?: No (165) Did pt. bring in CBG monitor from home?: No  How often do you need to have someone help you when you read instructions, pamphlets, or other written materials from your doctor or pharmacy?: 1 - Never What is the last grade level you completed in school?: High School Graduate  Diabetic? yes  Interpreter Needed?: No  Information entered by :: Lisette Abu, LPN   Activities of Daily Living In your present state of health, do you have any difficulty performing the following activities: 05/07/2021 02/05/2021  Hearing? N N  Vision? N N  Difficulty concentrating or making decisions? N N  Walking or climbing stairs? N N  Dressing or bathing? N N  Doing errands, shopping? N N  Preparing Food and eating ? N -  Using the Toilet? N -  In the past six months, have you accidently leaked urine? Y -  Do you have problems with loss of bowel control? N -  Managing your Medications? N -  Managing your Finances? N -  Housekeeping or managing your Housekeeping? N -  Some recent data might be hidden    Patient Care Team: Janith Lima, MD as PCP - General (Internal Medicine) Wilford Corner, MD as Consulting Physician (Gastroenterology) Philemon Kingdom, MD as Consulting Physician (Internal Medicine) Gardiner Barefoot, DPM as Consulting Physician (Podiatry) Charlton Haws, [facility] as Pharmacist (Pharmacist) Warden Fillers, MD as Consulting Physician (Ophthalmology)  Indicate any recent  Medical Services you may have received from other than [facility] providers in the past year (date may be approximate).     Assessment:   This is a routine wellness examination for Charles Parker.  Hearing/Vision screen Hearing Screening - Comments:: Patient denied any hearing difficulty. No hearing aids. Vision Screening - Comments:: Patient wears eye glasses.  Eye exam done annually by Dr. Warden Fillers.  Dietary issues and exercise activities discussed: Current Exercise Habits: Home exercise routine, Type of exercise: walking, Time (Minutes): 30, Frequency (Times/Week): 5, Weekly Exercise (Minutes/Week): 150, Intensity: Moderate, Exercise limited by: orthopedic condition(s)   Goals Addressed   None   Depression Screen PHQ 2/9 Scores 05/07/2021 11/12/2020 12/12/2019 06/19/2019 05/24/2019 04/18/2019 03/30/2019  PHQ - 2 Score 0 0 0 0 0 0 0  PHQ- 9 Score - - - - - - -    Fall Risk Fall Risk  05/07/2021 11/12/2020 10/21/2020 08/26/2020 07/08/2020  Falls in the past year? 0 0 '1 1 1  ' Number falls in past yr: 0 - 0 0 0  Comment - - - - -  Injury with Fall? 1 - 0 - 0  Risk Factor Category  - - - - -  Risk for fall due to : No Fall Risks - History of fall(s);Impaired balance/gait;Impaired mobility History of fall(s);Impaired balance/gait;Impaired mobility History of fall(s);Impaired balance/gait;Impaired mobility  Follow up Falls evaluation completed - Falls evaluation completed Falls evaluation completed;Falls prevention discussed Falls evaluation completed  Comment - - - - -    FALL RISK PREVENTION PERTAINING TO THE HOME:  Any stairs in or around the home? No  If so, are there any without handrails? No  Home free of loose throw rugs in walkways, pet beds, electrical cords, etc? Yes  Adequate lighting in your home to reduce risk of falls? Yes   ASSISTIVE DEVICES UTILIZED TO PREVENT FALLS:  Life alert? No  Use of a cane, walker or w/c? Yes  Grab bars in the bathroom? Yes  Shower chair or bench in  shower? Yes  Elevated toilet  seat or a handicapped toilet? Yes   TIMED UP AND GO:  Was the test performed? Yes .  Length of time to ambulate 10 feet: 9 sec.   Gait steady and fast with assistive device  Cognitive Function: Normal cognitive status assessed by direct observation by this Nurse Health Advisor. No abnormalities found.   MMSE - Mini Mental State Exam 01/04/2018 11/13/2015  Orientation to time 5 5  Orientation to Place 5 5  Registration 3 3  Attention/ Calculation 3 5  Recall 1 3  Language- name 2 objects 2 2  Language- repeat 1 1  Language- follow 3 step command 3 3  Language- read & follow direction 1 1  Write a sentence 1 1  Copy design 1 1  Total score 26 30        Immunizations Immunization History  Administered Date(s) Administered   Fluad Quad(high Dose 65+) 07/17/2019, 07/08/2020   Influenza, High Dose Seasonal PF 06/13/2017, 07/06/2018   Influenza,inj,Quad PF,6+ Mos 06/20/2013, 07/10/2014   Influenza-Unspecified 06/05/2015, 07/26/2016   PFIZER(Purple Top)SARS-COV-2 Vaccination 10/26/2019, 11/16/2019, 07/19/2020   PPD Test 10/29/2016   Pneumococcal Conjugate-13 06/20/2013, 07/23/2014   Pneumococcal Polysaccharide-23 06/19/2015, 02/05/2021   Tdap 06/20/2013   Zoster Recombinat (Shingrix) 07/17/2019    TDAP status: Up to date  Flu Vaccine status: Up to date  Pneumococcal vaccine status: Up to date  Covid-19 vaccine status: Completed vaccines  Qualifies for Shingles Vaccine? Yes   Zostavax completed No   Shingrix Completed?: No.    Education has been provided regarding the importance of this vaccine. Patient has been advised to call insurance company to determine out of pocket expense if they have not yet received this vaccine. Advised may also receive vaccine at local pharmacy or Health Dept. Verbalized acceptance and understanding.  Screening Tests Health Maintenance  Topic Date Due   Zoster Vaccines- Shingrix (2 of 2) 09/11/2019    OPHTHALMOLOGY EXAM  04/09/2020   COVID-19 Vaccine (4 - Booster for Pfizer series) 11/19/2020   INFLUENZA VACCINE  05/04/2021   FOOT EXAM  07/09/2021   HEMOGLOBIN A1C  10/16/2021   URINE MICROALBUMIN  11/07/2021   COLONOSCOPY (Pts 45-69yr Insurance coverage will need to be confirmed)  11/23/2021   TETANUS/TDAP  06/21/2023   Hepatitis C Screening  Completed   PNA vac Low Risk Adult  Completed   HPV VACCINES  Aged Out    Health Maintenance  Health Maintenance Due  Topic Date Due   Zoster Vaccines- Shingrix (2 of 2) 09/11/2019   OPHTHALMOLOGY EXAM  04/09/2020   COVID-19 Vaccine (4 - Booster for Pfizer series) 11/19/2020   INFLUENZA VACCINE  05/04/2021    Colorectal cancer screening: Type of screening: Colonoscopy. Completed 11/23/2018. Repeat every 3 years  Lung Cancer Screening: (Low Dose CT Chest recommended if Age 75-80years, 30 pack-year currently smoking OR have quit w/in 15years.) does not qualify.   Lung Cancer Screening Referral: no  Additional Screening:  Hepatitis C Screening: does qualify; Completed yes  Vision Screening: Recommended annual ophthalmology exams for early detection of glaucoma and other disorders of the eye. Is the patient up to date with their annual eye exam?  Yes  Who is the provider or what is the name of the office in which the patient attends annual eye exams? CWarden Fillers MD. If pt is not established with a provider, would they like to be referred to a provider to establish care? No .   Dental Screening: Recommended annual dental exams for proper  oral hygiene  Community Resource Referral / Chronic Care Management: CRR required this visit?  No   CCM required this visit?  No      Plan:     I have personally reviewed and noted the following in the patient's chart:   Medical and social history Use of alcohol, tobacco or illicit drugs  Current medications and supplements including opioid prescriptions. Patient is not currently  taking opioid prescriptions. Functional ability and status Nutritional status Physical activity Advanced directives List of other physicians Hospitalizations, surgeries, and ER visits in previous 12 months Vitals Screenings to include cognitive, depression, and falls Referrals and appointments  In addition, I have reviewed and discussed with patient certain preventive protocols, quality metrics, and best practice recommendations. A written personalized care plan for preventive services as well as general preventive health recommendations were provided to patient.     Sheral Flow, LPN   04/03/2196   Nurse Notes: n/a

## 2021-05-07 NOTE — Patient Instructions (Addendum)
[name] , Thank you for taking time to come for your Medicare Wellness Visit. I appreciate your ongoing commitment to your health goals. Please review the following plan we discussed and let me know if I can assist you in the future.   Screening recommendations/referrals: Colonoscopy: last done 11/23/2018 due every 3 years Recommended yearly ophthalmology/optometry visit for glaucoma screening and checkup Recommended yearly dental visit for hygiene and checkup  Vaccinations: Influenza vaccine: 07/08/2020 Pneumococcal vaccine: 07/23/2014, 02/05/2021 Tdap vaccine: 06/20/2013; due every 10 years Shingles vaccine: 07/17/2019; need second dose; check with [facility]. Covid-19: 10/26/2019, 11/16/2019, 07/19/2020  Advanced directives: Please bring a copy of your health care power of attorney and living will to the office at your convenience.  Conditions/risks identified: Yes; Client understands the importance of follow-up with providers by attending scheduled visits and discussed goals to eat healthier, increase physical activity, exercise the brain, socialize more, get enough sleep and make time for laughter.  Next appointment: Please schedule your next Medicare Wellness Visit with your Nurse Health Advisor in 1 year by calling [phone].  Preventive Care 64 Years and Older, Male Preventive care refers to lifestyle choices and visits with your health care provider that can promote health and wellness. What does preventive care include? A yearly physical exam. This is also called an annual well check. Dental exams once or twice a year. Routine eye exams. Ask your health care provider how often you should have your eyes checked. Personal lifestyle choices, including: Daily care of your teeth and gums. Regular physical activity. Eating a healthy diet. Avoiding tobacco and drug use. Limiting alcohol use. Practicing safe sex. Taking low doses of aspirin every day. Taking vitamin and mineral  supplements as recommended by your health care provider. What happens during an annual well check? The services and screenings done by your health care provider during your annual well check will depend on your age, overall health, lifestyle risk factors, and family history of disease. Counseling  Your health care provider may ask you questions about your: Alcohol use. Tobacco use. Drug use. Emotional well-being. Home and relationship well-being. Sexual activity. Eating habits. History of falls. Memory and ability to understand (cognition). Work and work Statistician. Screening  You may have the following tests or measurements: Height, weight, and BMI. Blood pressure. Lipid and cholesterol levels. These may be checked every 5 years, or more frequently if you are over 54 years old. Skin check. Lung cancer screening. You may have this screening every year starting at age 58 if you have a 30-pack-year history of smoking and currently smoke or have quit within the past 15 years. Fecal occult blood test (FOBT) of the stool. You may have this test every year starting at age 7. Flexible sigmoidoscopy or colonoscopy. You may have a sigmoidoscopy every 5 years or a colonoscopy every 10 years starting at age 3. Prostate cancer screening. Recommendations will vary depending on your family history and other risks. Hepatitis C blood test. Hepatitis B blood test. Sexually transmitted disease (STD) testing. Diabetes screening. This is done by checking your blood sugar (glucose) after you have not eaten for a while (fasting). You may have this done every 1-3 years. Abdominal aortic aneurysm (AAA) screening. You may need this if you are a current or former smoker. Osteoporosis. You may be screened starting at age 64 if you are at high risk. Talk with your health care provider about your test results, treatment options, and if necessary, the need for more tests. Vaccines  Your  health care provider  may recommend certain vaccines, such as: Influenza vaccine. This is recommended every year. Tetanus, diphtheria, and acellular pertussis (Tdap, Td) vaccine. You may need a Td booster every 10 years. Zoster vaccine. You may need this after age 73. Pneumococcal 13-valent conjugate (PCV13) vaccine. One dose is recommended after age 41. Pneumococcal polysaccharide (PPSV23) vaccine. One dose is recommended after age 50. Talk to your health care provider about which screenings and vaccines you need and how often you need them. This information is not intended to replace advice given to you by your health care provider. Make sure you discuss any questions you have with your health care provider. Document Released: 10/17/2015 Document Revised: 06/09/2016 Document Reviewed: 07/22/2015 Elsevier Interactive Patient Education  2017 [place] Prevention in the Home Falls can cause injuries. They can happen to people of all ages. There are many things you can do to make your home safe and to help prevent falls. What can I do on the outside of my home? Regularly fix the edges of walkways and driveways and fix any cracks. Remove anything that might make you trip as you walk through a door, such as a raised step or threshold. Trim any bushes or trees on the path to your home. Use bright outdoor lighting. Clear any walking paths of anything that might make someone trip, such as rocks or tools. Regularly check to see if handrails are loose or broken. Make sure that both sides of any steps have handrails. Any raised decks and porches should have guardrails on the edges. Have any leaves, snow, or ice cleared regularly. Use sand or salt on walking paths during winter. Clean up any spills in your garage right away. This includes oil or grease spills. What can I do in the bathroom? Use night lights. Install grab bars by the toilet and in the tub and shower. Do not use towel bars as grab bars. Use  non-skid mats or decals in the tub or shower. If you need to sit down in the shower, use a plastic, non-slip stool. Keep the floor dry. Clean up any water that spills on the floor as soon as it happens. Remove soap buildup in the tub or shower regularly. Attach bath mats securely with double-sided non-slip rug tape. Do not have throw rugs and other things on the floor that can make you trip. What can I do in the bedroom? Use night lights. Make sure that you have a light by your bed that is easy to reach. Do not use any sheets or blankets that are too big for your bed. They should not hang down onto the floor. Have a firm chair that has side arms. You can use this for support while you get dressed. Do not have throw rugs and other things on the floor that can make you trip. What can I do in the kitchen? Clean up any spills right away. Avoid walking on wet floors. Keep items that you use a lot in easy-to-reach places. If you need to reach something above you, use a strong step stool that has a grab bar. Keep electrical cords out of the way. Do not use floor polish or wax that makes floors slippery. If you must use wax, use non-skid floor wax. Do not have throw rugs and other things on the floor that can make you trip. What can I do with my stairs? Do not leave any items on the stairs. Make sure that there are handrails  on both sides of the stairs and use them. Fix handrails that are broken or loose. Make sure that handrails are as long as the stairways. Check any carpeting to make sure that it is firmly attached to the stairs. Fix any carpet that is loose or worn. Avoid having throw rugs at the top or bottom of the stairs. If you do have throw rugs, attach them to the floor with carpet tape. Make sure that you have a light switch at the top of the stairs and the bottom of the stairs. If you do not have them, ask someone to add them for you. What else can I do to help prevent falls? Wear  shoes that: Do not have high heels. Have rubber bottoms. Are comfortable and fit you well. Are closed at the toe. Do not wear sandals. If you use a stepladder: Make sure that it is fully opened. Do not climb a closed stepladder. Make sure that both sides of the stepladder are locked into place. Ask someone to hold it for you, if possible. Clearly mark and make sure that you can see: Any grab bars or handrails. First and last steps. Where the edge of each step is. Use tools that help you move around (mobility aids) if they are needed. These include: Canes. Walkers. Scooters. Crutches. Turn on the lights when you go into a dark area. Replace any light bulbs as soon as they burn out. Set up your furniture so you have a clear path. Avoid moving your furniture around. If any of your floors are uneven, fix them. If there are any pets around you, be aware of where they are. Review your medicines with your doctor. Some medicines can make you feel dizzy. This can increase your chance of falling. Ask your doctor what other things that you can do to help prevent falls. This information is not intended to replace advice given to you by your health care provider. Make sure you discuss any questions you have with your health care provider. Document Released: 07/17/2009 Document Revised: 02/26/2016 Document Reviewed: 10/25/2014 Elsevier Interactive Patient Education  2017 [employer].

## 2021-05-09 ENCOUNTER — Emergency Department (HOSPITAL_COMMUNITY)
Admission: EM | Admit: 2021-05-09 | Discharge: 2021-05-09 | Disposition: A | Discharge status: Home / Self Care | Payer: Medicare Other | Attending: Emergency Medicine | Admitting: Emergency Medicine

## 2021-05-09 ENCOUNTER — Ambulatory Visit (HOSPITAL_COMMUNITY): Admission: EM | Admit: 2021-05-09 | Discharge: 2021-05-09 | Payer: Medicare Other

## 2021-05-09 ENCOUNTER — Other Ambulatory Visit: Payer: Self-pay

## 2021-05-09 ENCOUNTER — Emergency Department (HOSPITAL_COMMUNITY): Payer: Medicare Other

## 2021-05-09 ENCOUNTER — Encounter (HOSPITAL_COMMUNITY): Payer: Self-pay

## 2021-05-09 ENCOUNTER — Encounter (HOSPITAL_COMMUNITY): Payer: Self-pay | Admitting: Emergency Medicine

## 2021-05-09 DIAGNOSIS — E119 Type 2 diabetes mellitus without complications: Secondary | ICD-10-CM | POA: Insufficient documentation

## 2021-05-09 DIAGNOSIS — M542 Cervicalgia: Secondary | ICD-10-CM | POA: Diagnosis not present

## 2021-05-09 DIAGNOSIS — I10 Essential (primary) hypertension: Secondary | ICD-10-CM | POA: Insufficient documentation

## 2021-05-09 DIAGNOSIS — Z794 Long term (current) use of insulin: Secondary | ICD-10-CM | POA: Insufficient documentation

## 2021-05-09 DIAGNOSIS — Z79899 Other long term (current) drug therapy: Secondary | ICD-10-CM | POA: Diagnosis not present

## 2021-05-09 DIAGNOSIS — Z8673 Personal history of transient ischemic attack (TIA), and cerebral infarction without residual deficits: Secondary | ICD-10-CM | POA: Diagnosis not present

## 2021-05-09 DIAGNOSIS — M5030 Other cervical disc degeneration, unspecified cervical region: Secondary | ICD-10-CM | POA: Diagnosis not present

## 2021-05-09 DIAGNOSIS — H9202 Otalgia, left ear: Secondary | ICD-10-CM

## 2021-05-09 DIAGNOSIS — Z7984 Long term (current) use of oral hypoglycemic drugs: Secondary | ICD-10-CM | POA: Insufficient documentation

## 2021-05-09 DIAGNOSIS — I6523 Occlusion and stenosis of bilateral carotid arteries: Secondary | ICD-10-CM | POA: Diagnosis not present

## 2021-05-09 DIAGNOSIS — Z86010 Personal history of colonic polyps: Secondary | ICD-10-CM | POA: Diagnosis not present

## 2021-05-09 DIAGNOSIS — S199XXA Unspecified injury of neck, initial encounter: Secondary | ICD-10-CM | POA: Diagnosis not present

## 2021-05-09 DIAGNOSIS — G319 Degenerative disease of nervous system, unspecified: Secondary | ICD-10-CM | POA: Diagnosis not present

## 2021-05-09 DIAGNOSIS — S0990XA Unspecified injury of head, initial encounter: Secondary | ICD-10-CM | POA: Diagnosis not present

## 2021-05-09 LAB — CBC WITH DIFFERENTIAL/PLATELET
Abs Immature Granulocytes: 0.01 10*3/uL (ref 0.00–0.07)
Basophils Absolute: 0 10*3/uL (ref 0.0–0.1)
Basophils Relative: 0 %
Eosinophils Absolute: 0 10*3/uL (ref 0.0–0.5)
Eosinophils Relative: 1 %
HCT: 44.9 % (ref 39.0–52.0)
Hemoglobin: 14.9 g/dL (ref 13.0–17.0)
Immature Granulocytes: 0 %
Lymphocytes Relative: 23 %
Lymphs Abs: 1.4 10*3/uL (ref 0.7–4.0)
MCH: 31.2 pg (ref 26.0–34.0)
MCHC: 33.2 g/dL (ref 30.0–36.0)
MCV: 94.1 fL (ref 80.0–100.0)
Monocytes Absolute: 0.7 10*3/uL (ref 0.1–1.0)
Monocytes Relative: 11 %
Neutro Abs: 4 10*3/uL (ref 1.7–7.7)
Neutrophils Relative %: 65 %
Platelets: 141 10*3/uL — ABNORMAL LOW (ref 150–400)
RBC: 4.77 MIL/uL (ref 4.22–5.81)
RDW: 12.2 % (ref 11.5–15.5)
WBC: 6.2 10*3/uL (ref 4.0–10.5)
nRBC: 0 % (ref 0.0–0.2)

## 2021-05-09 LAB — BASIC METABOLIC PANEL
Anion gap: 8 (ref 5–15)
BUN: 10 mg/dL (ref 8–23)
CO2: 24 mmol/L (ref 22–32)
Calcium: 9.3 mg/dL (ref 8.9–10.3)
Chloride: 106 mmol/L (ref 98–111)
Creatinine, Ser: 0.97 mg/dL (ref 0.61–1.24)
GFR, Estimated: >60 mL/min (ref >60)
Glucose, Bld: 271 mg/dL — ABNORMAL HIGH (ref 70–99)
Potassium: 4 mmol/L (ref 3.5–5.1)
Sodium: 138 mmol/L (ref 135–145)

## 2021-05-09 MED ORDER — IOHEXOL 350 MG/ML SOLN
80.0000 mL | Freq: Once | INTRAVENOUS | Status: AC | PRN
  Administered 2021-05-09: 80 mL via INTRAVENOUS

## 2021-05-09 NOTE — ED Notes (Signed)
Patient is being discharged from the Urgent Care and sent to the Emergency Department via personal vehicle . Per Provider Nelwyn Salisbury, patient is in need of higher level of care due to needing mastoiditis workup. Patient is aware and verbalizes understanding of plan of care.   Vitals:   05/09/21 1146  BP: 121/85  Pulse: 89  Resp: 17  Temp: 98 F (36.7 C)  SpO2: 98%

## 2021-05-09 NOTE — ED Triage Notes (Signed)
Pt presents with left side facial pain X 1 week non injury related.

## 2021-05-09 NOTE — ED Provider Notes (Signed)
[facility] EMERGENCY DEPARTMENT Provider Note   CSN: [id number] Arrival date & time: 05/09/21  1245     History No chief complaint on file.   SORIN FRIMPONG is a 75 y.o. male with a past medical history significant for hypertension, hyperlipidemia, DM, history of DVT on chronic Xarelto, thrombocytopenia who presents to the ED from urgent care due to left sided neck pain posterior to left ear x1 week.  Patient states pain is so bad he cannot lay on his left side.  Denies otalgia and ear drainage.  Denies associated rash.  He is finished his last shingles vaccine yesterday.  Denies any neck trauma or head injury. No headache or dizziness. Denies chest pain and shortness of breath.  Patient was evaluated urgent care prior to arrival and sent to the ED for a CT scan. No treatment prior to arrival. Pain is worse with palpation and movement of neck.  No fever or chills.  No visual changes.  History obtained from patient and past medical records. No interpreter used during encounter.       Past Medical History:  Diagnosis Date   Clotting disorder ([place])    DVT   Diabetes mellitus    type II   Diverticulosis    DVT (deep venous thrombosis) (HCC)    Glaucoma    no eye drops    Hepatic cyst    Hypercholesterolemia    Hypertension    Renal cyst    Sleep apnea    Tubulovillous adenoma     Patient Active Problem List   Diagnosis Date Noted   Thrombocytopenia ([place]) 02/05/2021   Rapid palpitations 02/05/2021   Encounter for general adult medical examination with abnormal findings 11/07/2020   Elevated factor VIII level 10/08/2019   Candidal balanitis 03/29/2019   Erectile dysfunction due to diabetes mellitus ([place]) 03/29/2019   Essential hypertension 05/18/2018   Vitamin D insufficiency 05/18/2018   Hallux rigidus, right foot 02/14/2017   Degenerative arthritis of left knee 01/11/2017   OAB (overactive bladder) 01/09/2017   Medication monitoring encounter  12/23/2016   Iron deficiency 12/16/2016   Type 2 diabetes mellitus with complication, with long-term current use of insulin ([place]) 12/15/2016   Hyperlipidemia with target LDL less than 70 10/21/2016   Thoracic spine fracture ([place]) 10/21/2016   Benign prostatic hyperplasia (BPH) with straining on urination 06/20/2013   Tubulovillous adenoma of colon with high grade dysplasia (rectosigmoid) s/p LAR with bladder repair 02/03/12. 12/27/2011    Past Surgical History:  Procedure Laterality Date   BLADDER NECK RECONSTRUCTION  02/03/2012   Procedure: BLADDER NECK REPAIR;  Surgeon: Adin Hector, MD;  Location: [facility] ORS;  Service: General;;   COLONOSCOPY  02/2013   polyp removal(mult.)   KNEE SURGERY  2004   left   POLYPECTOMY     PROCTOSCOPY  02/03/2012   Procedure: PROCTOSCOPY;  Surgeon: Adin Hector, MD;  Location: [facility] ORS;  Service: General;;   STOMACH SURGERY  02/2012   TEE WITHOUT CARDIOVERSION N/A 10/05/2016   Procedure: TRANSESOPHAGEAL ECHOCARDIOGRAM (TEE);  Surgeon: Sanda Klein, MD;  Location: [facility] END[facility];  Service: Cardiovascular;  Laterality: N/A;       Family History  Problem Relation Age of Onset   Hypertension Mother    Diabetes Mother    Cancer Mother        ? location   Colon cancer Father 76   Colon polyps Neg Hx    Liver cancer Neg Hx  Social History   Tobacco Use   Smoking status: Never   Smokeless tobacco: Never  Vaping Use   Vaping Use: Never used  Substance Use Topics   Alcohol use: No    Alcohol/week: 0.0 standard drinks    Comment: once every two months   Drug use: No    Home Medications Prior to Admission medications   Medication Sig Start Date End Date Taking? Authorizing Provider  Alcohol Swabs PADS Use to clean area before insulin injections. DX E11.8 10/21/20   Elayne Snare, MD  blood glucose meter kit and supplies KIT 1 each by Does not apply route daily as needed. Accu-Chek Aviva preferred by patient. Use up to four times daily as directed.  (FOR ICD-9 250.00, 250.01).    [provider]  Cholecalciferol 50 MCG (2000 UT) TABS Take 1 tablet (2,000 Units total) by mouth daily. 11/08/20   Janith Lima, MD  Continuous Blood Gluc Receiver (FREESTYLE LIBRE 2 READER) DEVI 1 Act by Does not apply route daily. 02/06/21   Janith Lima, MD  Continuous Blood Gluc Sensor (FREESTYLE LIBRE 2 SENSOR) MISC 1 Act by Does not apply route daily. 02/06/21   Janith Lima, MD  dapagliflozin propanediol (FARXIGA) 10 MG TABS tablet Take 1 tablet (10 mg total) by mouth daily. 12/15/20   Elayne Snare, MD  gabapentin (NEURONTIN) 300 MG capsule TAKE 1 CAPSULE(300 MG) BY MOUTH TWICE DAILY 10/21/20   Elayne Snare, MD  glimepiride (AMARYL) 4 MG tablet Take 1 tablet (4 mg total) by mouth daily before breakfast. 04/09/21   Janith Lima, MD  Glucagon (GVOKE HYPOPEN 2-PACK) 1 MG/0.2ML SOAJ Inject 1 Act into the skin daily as needed. 11/07/20   Janith Lima, MD  glucose blood test strip Use Accu Chek Aviva Test Strips as instructed to check blood sugar three times daily. 10/21/20   Elayne Snare, MD  insulin aspart protamine - aspart (NOVOLOG MIX 70/30 FLEXPEN) (70-30) 100 UNIT/ML FlexPen Inject 20 units under the skin breakfast and 26 units at dinner. Patient taking differently: Inject 20 units under the skin breakfast and 26 units at dinner. 02/06/21   Janith Lima, MD  Insulin Pen Needle (B-D UF III MINI PEN NEEDLES) 31G X 5 MM MISC ADMINISTER 30 UNITS EVERY EVENING 10/21/20   Elayne Snare, MD  Multiple Vitamin (MULTIVITAMIN WITH MINERALS) TABS tablet Take 1 tablet by mouth daily.    [provider]  rivaroxaban (XARELTO) 10 MG TABS tablet Take 1 tablet (10 mg total) by mouth daily. 04/09/21   Janith Lima, MD  simvastatin (ZOCOR) 40 MG tablet TAKE 1 TABLET(40 MG) BY MOUTH AT BEDTIME 11/17/20   Janith Lima, MD    Allergies    Metformin and related and Codeine  Review of Systems   Review of Systems  Constitutional:  Negative for chills and fever.   Eyes:  Negative for visual disturbance.  Musculoskeletal:  Positive for neck pain.  Skin:  Negative for rash.  Neurological:  Negative for numbness.  All other systems reviewed and are negative.  Physical Exam Updated Vital Signs BP (!) 159/102   Pulse 67   Temp 98.1 F (36.7 C) (Oral)   Resp 16   SpO2 99%   Physical Exam Vitals and nursing note reviewed.  Constitutional:      General: He is not in acute distress.    Appearance: He is not ill-appearing.  HENT:     Head: Normocephalic.     Ears:  Comments: No vesicles in left ear Eyes:     Pupils: Pupils are equal, round, and reactive to light.  Neck:     Comments: No cervical midline tenderness. Tenderness to left side of neck just inferior to mastoid bone.  Cardiovascular:     Rate and Rhythm: Normal rate and regular rhythm.     Pulses: Normal pulses.     Heart sounds: Normal heart sounds. No murmur heard.   No friction rub. No gallop.  Pulmonary:     Effort: Pulmonary effort is normal.     Breath sounds: Normal breath sounds.  Abdominal:     General: Abdomen is flat. There is no distension.     Palpations: Abdomen is soft.     Tenderness: There is no abdominal tenderness. There is no guarding or rebound.  Musculoskeletal:        General: Normal range of motion.     Cervical back: Neck supple.  Skin:    General: Skin is warm and dry.     Comments: No surrounding rash to left side of neck. No erythema, warmth, or induration to area of tenderness.  Neurological:     General: No focal deficit present.     Mental Status: He is alert.  Psychiatric:        Mood and Affect: Mood normal.        Behavior: Behavior normal.    ED Results / Procedures / Treatments   Labs (all labs ordered are listed, but only abnormal results are displayed) Labs Reviewed  CBC WITH DIFFERENTIAL/PLATELET - Abnormal; Notable for the following components:      Result Value   Platelets 141 (*)    All other components within normal  limits  BASIC METABOLIC PANEL - Abnormal; Notable for the following components:   Glucose, Bld 271 (*)    All other components within normal limits    EKG EKG Interpretation  Date/Time:  Saturday May 09 2021 16:14:29 EDT Ventricular Rate:  75 PR Interval:  183 QRS Duration: 84 QT Interval:  401 QTC Calculation: 448 R Axis:   41 Text Interpretation: Sinus arrhythmia Posterior infarct, old Borderline repolarization abnormality similar to 2018 Confirmed by Sherwood Gambler [id number]) on 05/09/2021 6:41:48 PM  Radiology CT Angio Head W or Wo Contrast  Result Date: 05/09/2021 CLINICAL DATA:  Neck trauma (Age >= 65y); Stroke/TIA, assess extracranial arteries. Left-sided neck pain and swelling for 1 week. EXAM: CT ANGIOGRAPHY HEAD AND NECK TECHNIQUE: Multidetector CT imaging of the head and neck was performed using the standard protocol during bolus administration of intravenous contrast. Multiplanar CT image reconstructions and MIPs were obtained to evaluate the vascular anatomy. Carotid stenosis measurements (when applicable) are obtained utilizing NASCET criteria, using the distal internal carotid diameter as the denominator. CONTRAST:  56m OMNIPAQUE IOHEXOL 350 MG/ML SOLN COMPARISON:  Head CT 10/07/2016 FINDINGS: CT HEAD FINDINGS Brain: There is no evidence of an acute infarct, intracranial hemorrhage, mass, midline shift, or extra-axial fluid collection. There is mild cerebral atrophy. A cavum septum pellucidum et vergae is noted, a normal variant. Vascular: Calcified atherosclerosis at the skull base. No hyperdense vessel. Skull: No fracture or suspicious osseous lesion. Sinuses: Mild left maxillary sinus mucosal thickening. Clear mastoid air cells. Orbits: Unremarkable. Review of the MIP images confirms the above findings CTA NECK FINDINGS Aortic arch: Normal variant aortic arch branching pattern with common origin of the brachiocephalic and left common carotid arteries. Mild atherosclerotic plaque  without arch vessel origin stenosis. Right carotid system: Patent  with minimal calcified plaque at the carotid bifurcation. No evidence of dissection or stenosis. Left carotid system: Patent without evidence of dissection, stenosis, or significant atherosclerosis. Vertebral arteries: The vertebral arteries are patent and codominant without a definite significant stenosis or dissection although assessment of the right V1 segment is mildly limited by artifact from venous contrast. Skeleton: Moderate cervical disc degeneration. Other neck: No evidence of cervical lymphadenopathy or mass. Upper chest: No apical lung consolidation or mass. Review of the MIP images confirms the above findings CTA HEAD FINDINGS Anterior circulation: The internal carotid arteries are patent from skull base to carotid termini with mild atherosclerotic plaque in the left carotid siphon not resulting in a significant stenosis. ACAs and MCAs are patent without evidence of a proximal branch occlusion or significant proximal stenosis. No aneurysm is identified. Posterior circulation: The intracranial vertebral arteries are widely patent to the basilar. The basilar artery is patent and congenitally small without evidence of a significant focal stenosis. There is a fetal origin of the left PCA. Both PCAs are patent without evidence of a significant proximal stenosis. No aneurysm is identified. Venous sinuses: As permitted by contrast timing, patent. Anatomic variants: Fetal left PCA. Review of the MIP images confirms the above findings IMPRESSION: 1. No evidence of acute intracranial abnormality. 2. Mild atherosclerosis in the head and neck without large vessel occlusion, significant stenosis, or aneurysm. Electronically Signed   By: Logan Bores M.D.   On: 05/09/2021 19:17   CT Angio Neck W and/or Wo Contrast  Result Date: 05/09/2021 CLINICAL DATA:  Neck trauma (Age >= 65y); Stroke/TIA, assess extracranial arteries. Left-sided neck pain and  swelling for 1 week. EXAM: CT ANGIOGRAPHY HEAD AND NECK TECHNIQUE: Multidetector CT imaging of the head and neck was performed using the standard protocol during bolus administration of intravenous contrast. Multiplanar CT image reconstructions and MIPs were obtained to evaluate the vascular anatomy. Carotid stenosis measurements (when applicable) are obtained utilizing NASCET criteria, using the distal internal carotid diameter as the denominator. CONTRAST:  47m OMNIPAQUE IOHEXOL 350 MG/ML SOLN COMPARISON:  Head CT 10/07/2016 FINDINGS: CT HEAD FINDINGS Brain: There is no evidence of an acute infarct, intracranial hemorrhage, mass, midline shift, or extra-axial fluid collection. There is mild cerebral atrophy. A cavum septum pellucidum et vergae is noted, a normal variant. Vascular: Calcified atherosclerosis at the skull base. No hyperdense vessel. Skull: No fracture or suspicious osseous lesion. Sinuses: Mild left maxillary sinus mucosal thickening. Clear mastoid air cells. Orbits: Unremarkable. Review of the MIP images confirms the above findings CTA NECK FINDINGS Aortic arch: Normal variant aortic arch branching pattern with common origin of the brachiocephalic and left common carotid arteries. Mild atherosclerotic plaque without arch vessel origin stenosis. Right carotid system: Patent with minimal calcified plaque at the carotid bifurcation. No evidence of dissection or stenosis. Left carotid system: Patent without evidence of dissection, stenosis, or significant atherosclerosis. Vertebral arteries: The vertebral arteries are patent and codominant without a definite significant stenosis or dissection although assessment of the right V1 segment is mildly limited by artifact from venous contrast. Skeleton: Moderate cervical disc degeneration. Other neck: No evidence of cervical lymphadenopathy or mass. Upper chest: No apical lung consolidation or mass. Review of the MIP images confirms the above findings CTA  HEAD FINDINGS Anterior circulation: The internal carotid arteries are patent from skull base to carotid termini with mild atherosclerotic plaque in the left carotid siphon not resulting in a significant stenosis. ACAs and MCAs are patent without evidence of a proximal branch  occlusion or significant proximal stenosis. No aneurysm is identified. Posterior circulation: The intracranial vertebral arteries are widely patent to the basilar. The basilar artery is patent and congenitally small without evidence of a significant focal stenosis. There is a fetal origin of the left PCA. Both PCAs are patent without evidence of a significant proximal stenosis. No aneurysm is identified. Venous sinuses: As permitted by contrast timing, patent. Anatomic variants: Fetal left PCA. Review of the MIP images confirms the above findings IMPRESSION: 1. No evidence of acute intracranial abnormality. 2. Mild atherosclerosis in the head and neck without large vessel occlusion, significant stenosis, or aneurysm. Electronically Signed   By: Logan Bores M.D.   On: 05/09/2021 19:17    Procedures Procedures   Medications Ordered in ED Medications  iohexol (OMNIPAQUE) 350 MG/ML injection 80 mL (80 mLs Intravenous Contrast Given 05/09/21 1809)    ED Course  I have reviewed the triage vital signs and the nursing notes.  Pertinent labs & imaging results that were available during my care of the patient were reviewed by me and considered in my medical decision making (see chart for details).    MDM Rules/Calculators/A&P                          75 year old male presents to the ED due to left-sided neck pain x1 week.  Patient evaluated urgent care prior to arrival and sent to the ED for CT scan.  Patient denies otalgia, hearing loss, rash, dizziness, numbness/tingling, and headache.  No trauma.  Upon arrival, stable vitals.  Patient is afebrile, not tachycardic or hypoxic.  Patient in no acute distress.  Physical exam significant  for tenderness below the left mastoid bone without erythema or induration. Tenderness more over musculature. No mastoid bone tenderness.  Low suspicion for mastoiditis.  No surrounding rash to suggest shingles. Left TM unremarkable.  No vesicles. Labs ordered at triage. CTA neck/head to rule out dissection. Discussed case with Dr. Regenia Skeeter who evaluated patient at bedside and agrees with assessment and plan.  CTA head/neck negative for dissection. No acute abnormalities. No erythema or severe tenderness over mastoid bone.  Low suspicion for mastoiditis.  No LUE weakness or cervical midline tenderness. Low suspicion for central cord compression. Advised patient to take over-the-counter ibuprofen or Tylenol as needed for pain.  Advised patient to follow-up with PCP within the next week for further evaluation. Strict ED precautions discussed with patient. Patient states understanding and agrees to plan. Patient discharged home in no acute distress and stable vitals  Final Clinical Impression(s) / ED Diagnoses Final diagnoses:  Neck pain    Rx / DC Orders ED Discharge Orders     None        Karie Kirks 05/09/21 1942    Sherwood Gambler, MD 05/09/21 2149

## 2021-05-09 NOTE — ED Provider Notes (Signed)
Emergency Medicine Provider Triage Evaluation Note  Charles Parker , a 75 y.o. male  was evaluated in triage.  Pt complains of left ear pain near the base of his skull. Ongoing about a week, states he can't sleep on that side. Feels like muscle spasms.Was seen at urgent and sent here for a CT scan. Denies any pain in the ear, hearing loss. H/o DM TII. Denies any CP, dizziness, syncope  Review of Systems  Positive: As above  Negative: As above   Physical Exam  There were no vitals taken for this visit. Gen:   Awake, no distress   Resp:  Normal effort  MSK:   Moves extremities without difficulty  Other:  Reproducible pain to the left mastoid and musculature of the left neck.  No overlying warmth, erythema, no deviation of the left ear. Medical Decision Making  Medically screening exam initiated at 1:26 PM.  Appropriate orders placed.  Charles Parker was informed that the remainder of the evaluation will be completed by another provider, this initial triage assessment does not replace that evaluation, and the importance of remaining in the ED until their evaluation is complete.     Garald Balding, PA-C 05/09/21 1335    Regan Lemming, MD 05/09/21 2148

## 2021-05-09 NOTE — Discharge Instructions (Addendum)
It was a pleasure taking care of you today.  As discussed, your CT scan was negative for any acute abnormalities.  You may take over-the-counter ibuprofen or Tylenol as needed for pain.  You may also purchase over-the-counter Voltaren gel or Lidoderm patches.  Please follow-up with PCP within the next week for further evaluation.  Return to the ER for new or worsening symptoms.

## 2021-05-09 NOTE — ED Triage Notes (Signed)
Pt reports L sided neck pain and swelling x 1 week.  Denies ear pain.  Denies dizziness.

## 2021-05-09 NOTE — ED Provider Notes (Signed)
[facility]    CSN: [id number] Arrival date & time: 05/09/21  1015      History   Chief Complaint Chief Complaint  Patient presents with   Facial Pain    HPI Charles Parker is a 75 y.o. male.   HPI  Ear Pain: Patient reports pain of his posterior left ear near the base of his skull.  He reports that this has been worsening over the last few days.  He recalls some internal ear pain without any pain of his auricle or pinna. No fevers, dental pain, decreased hearing or trauma. He has not taken anything for pain. He reports that he is concerned    Past Medical History:  Diagnosis Date   Clotting disorder ([place])    DVT   Diabetes mellitus    type II   Diverticulosis    DVT (deep venous thrombosis) (H[facility])    Glaucoma    no eye drops    Hepatic cyst    Hypercholesterolemia    Hypertension    Renal cyst    Sleep apnea    Tubulovillous adenoma     Patient Active Problem List   Diagnosis Date Noted   Thrombocytopenia ([place]) 02/05/2021   Rapid palpitations 02/05/2021   Encounter for general adult medical examination with abnormal findings 11/07/2020   Elevated factor VIII level 10/08/2019   Candidal balanitis 03/29/2019   Erectile dysfunction due to diabetes mellitus ([place]) 03/29/2019   Essential hypertension 05/18/2018   Vitamin D insufficiency 05/18/2018   Hallux rigidus, right foot 02/14/2017   Degenerative arthritis of left knee 01/11/2017   OAB (overactive bladder) 01/09/2017   Medication monitoring encounter 12/23/2016   Iron deficiency 12/16/2016   Type 2 diabetes mellitus with complication, with long-term current use of insulin ([place]) 12/15/2016   Hyperlipidemia with target LDL less than 70 10/21/2016   Thoracic spine fracture ([place]) 10/21/2016   Benign prostatic hyperplasia (BPH) with straining on urination 06/20/2013   Tubulovillous adenoma of colon with high grade dysplasia (rectosigmoid) s/p LAR with bladder repair 02/03/12. 12/27/2011    Past  Surgical History:  Procedure Laterality Date   BLADDER NECK RECONSTRUCTION  02/03/2012   Procedure: BLADDER NECK REPAIR;  Surgeon: Adin Hector, MD;  Location: [facility]S;  Service: General;;   COLONOSCOPY  02/2013   polyp removal(mult.)   KNEE SURGERY  2004   left   POLYPECTOMY     PROCTOSCOPY  02/03/2012   Procedure: PROCTOSCOPY;  Surgeon: Adin Hector, MD;  Location: [facility];  Service: General;;   STOMACH SURGERY  02/2012   TEE WITHOUT CARDIOVERSION N/A 10/05/2016   Procedure: TRANSESOPHAGEAL ECHOCARDIOGRAM (TEE);  Surgeon: Sanda Klein, MD;  Location: [facility] ENDOSCOPY;  Service: Cardiovascular;  Laterality: N/A;       Home Medications    Prior to Admission medications   Medication Sig Start Date End Date Taking? Authorizing Provider  Alcohol Swabs PADS Use to clean area before insulin injections. DX E11.8 10/21/20   Elayne Snare, MD  blood glucose meter kit and supplies KIT 1 each by Does not apply route daily as needed. Accu-Chek Aviva preferred by patient. Use up to four times daily as directed. (FOR ICD-9 250.00, 250.01).    [provider]  Cholecalciferol 50 MCG (2000 UT) TABS Take 1 tablet (2,000 Units total) by mouth daily. 11/08/20   Janith Lima, MD  Continuous Blood Gluc Receiver (FREESTYLE LIBRE 2 READER) DEVI 1 Act by Does not apply route daily. 02/06/21  Janith Lima, MD  Continuous Blood Gluc Sensor (FREESTYLE LIBRE 2 SENSOR) MISC 1 Act by Does not apply route daily. 02/06/21   Janith Lima, MD  dapagliflozin propanediol (FARXIGA) 10 MG TABS tablet Take 1 tablet (10 mg total) by mouth daily. 12/15/20   Elayne Snare, MD  gabapentin (NEURONTIN) 300 MG capsule TAKE 1 CAPSULE(300 MG) BY MOUTH TWICE DAILY 10/21/20   Elayne Snare, MD  glimepiride (AMARYL) 4 MG tablet Take 1 tablet (4 mg total) by mouth daily before breakfast. 04/09/21   Janith Lima, MD  Glucagon (GVOKE HYPOPEN 2-PACK) 1 MG/0.2ML SOAJ Inject 1 Act into the skin daily as needed. 11/07/20   Janith Lima, MD   glucose blood test strip Use Accu Chek Aviva Test Strips as instructed to check blood sugar three times daily. 10/21/20   Elayne Snare, MD  insulin aspart protamine - aspart (NOVOLOG MIX 70/30 FLEXPEN) (70-30) 100 UNIT/ML FlexPen Inject 20 units under the skin breakfast and 26 units at dinner. Patient taking differently: Inject 20 units under the skin breakfast and 26 units at dinner. 02/06/21   Janith Lima, MD  Insulin Pen Needle (B-D UF III MINI PEN NEEDLES) 31G X 5 MM MISC ADMINISTER 30 UNITS EVERY EVENING 10/21/20   Elayne Snare, MD  Multiple Vitamin (MULTIVITAMIN WITH MINERALS) TABS tablet Take 1 tablet by mouth daily.    [provider]  rivaroxaban (XARELTO) 10 MG TABS tablet Take 1 tablet (10 mg total) by mouth daily. 04/09/21   Janith Lima, MD  simvastatin (ZOCOR) 40 MG tablet TAKE 1 TABLET(40 MG) BY MOUTH AT BEDTIME 11/17/20   Janith Lima, MD    Family History Family History  Problem Relation Age of Onset   Hypertension Mother    Diabetes Mother    Cancer Mother        ? location   Colon cancer Father 20   Colon polyps Neg Hx    Liver cancer Neg Hx     Social History Social History   Tobacco Use   Smoking status: Never   Smokeless tobacco: Never  Vaping Use   Vaping Use: Never used  Substance Use Topics   Alcohol use: No    Alcohol/week: 0.0 standard drinks    Comment: once every two months   Drug use: No     Allergies   Metformin and related and Codeine   Review of Systems Review of Systems  As stated above in HPI Physical Exam Triage Vital Signs ED Triage Vitals  Enc Vitals Group     BP 05/09/21 1146 121/85     Pulse Rate 05/09/21 1146 89     Resp 05/09/21 1146 17     Temp 05/09/21 1146 98 F (36.7 C)     Temp Source 05/09/21 1146 Oral     SpO2 05/09/21 1146 98 %     Weight --      Height --      Head Circumference --      Peak Flow --      Pain Score 05/09/21 1144 5     Pain Loc --      Pain Edu? --      Excl. in [place]? --    No  data found.  Updated Vital Signs BP 121/85 (BP Location: Right Arm)   Pulse 89   Temp 98 F (36.7 C) (Oral)   Resp 17   SpO2 98%    Physical Exam Vitals and nursing  note reviewed.  Constitutional:      General: He is not in acute distress.    Appearance: Normal appearance. He is not ill-appearing, toxic-appearing or diaphoretic.  HENT:     Head: Normocephalic and atraumatic.     Right Ear: Tympanic membrane, ear canal and external ear normal.     Left Ear: Tympanic membrane, ear canal and external ear normal.     Ears:     Comments: Bogginess and tenderness of the left mastoid    Nose: Nose normal.     Mouth/Throat:     Mouth: Mucous membranes are moist.  Eyes:     Extraocular Movements: Extraocular movements intact.     Pupils: Pupils are equal, round, and reactive to light.  Cardiovascular:     Rate and Rhythm: Normal rate and regular rhythm.     Heart sounds: Normal heart sounds.  Pulmonary:     Effort: Pulmonary effort is normal.     Breath sounds: Normal breath sounds.  Skin:    Findings: No erythema or rash.  Neurological:     Mental Status: He is alert and oriented to person, place, and time.     UC Treatments / Results  Labs (all labs ordered are listed, but only abnormal results are displayed) Labs Reviewed - No data to display  EKG   Radiology No results found.  Procedures Procedures (including critical care time)  Medications Ordered in UC Medications - No data to display  Initial Impression / Assessment and Plan / UC Course  I have reviewed the triage vital signs and the nursing notes.  Pertinent labs & imaging results that were available during my care of the patient were reviewed by me and considered in my medical decision making (see chart for details).     New.  Patient repeatedly asked if I was the "doctor" seeing him today.  I made it clear to patient that I was the provider who was seeing him and that I am a physician assistant.  I  assured patient that I was qualified and able to help him with his concerns today.  Patient requested an x-ray for his concern.  I discussed with patient that I am concerned about the mastoiditis and that an x-ray would not be beneficial however a CT scan would be indicated and beneficial.  He does not have a fever and with his mild bogginess I did offer a 24-hour trial on antibiotics if he was not agreeable to go to the emergency room.  After further discussion he is agreeable to go to the emergency room for further work-up. Final Clinical Impressions(s) / UC Diagnoses   Final diagnoses:  None   Discharge Instructions   None    ED Prescriptions   None    PDMP not reviewed this encounter.   Hughie Closs, [place] 05/09/21 1845

## 2021-05-27 ENCOUNTER — Telehealth: Payer: Self-pay | Admitting: Pharmacist

## 2021-05-27 NOTE — Progress Notes (Signed)
Chronic Care Management Pharmacy Assistant   Name: SPURGEON GANCARZ  MRN: [id number] DOB: 23-Aug-1946   Reason for Encounter: Disease State   Conditions to be addressed/monitored: DMII   Recent office visits:  None ID  Recent consult visits:  04/27/21 Elayne Snare, MD-Endocrinology (Uncontrolled type 2 diabetes mellitus with hyperglycemia, with long-term current use of insulin)  04/15/21 Gardiner Barefoot, DPM-Podiatry (Diabetes mellitus type 2 with peripheral artery disease)  Hospital visits:  Medication Reconciliation was completed by comparing discharge summary, patient's EMR and Pharmacy list, and upon discussion with patient.  Admitted to the hospital on 05/09/21 due to neck pain. Discharge date was 05/09/21. Discharged from [facility].     Medications that remain the same after Hospital Discharge:??  -All other medications will remain the same.    Medications: Outpatient Encounter Medications as of 05/27/2021  Medication Sig   Alcohol Swabs PADS Use to clean area before insulin injections. DX E11.8   blood glucose meter kit and supplies KIT 1 each by Does not apply route daily as needed. Accu-Chek Aviva preferred by patient. Use up to four times daily as directed. (FOR ICD-9 250.00, 250.01).   Cholecalciferol 50 MCG (2000 UT) TABS Take 1 tablet (2,000 Units total) by mouth daily.   Continuous Blood Gluc Receiver (FREESTYLE LIBRE 2 READER) DEVI 1 Act by Does not apply route daily.   Continuous Blood Gluc Sensor (FREESTYLE LIBRE 2 SENSOR) MISC 1 Act by Does not apply route daily.   dapagliflozin propanediol (FARXIGA) 10 MG TABS tablet Take 1 tablet (10 mg total) by mouth daily.   gabapentin (NEURONTIN) 300 MG capsule TAKE 1 CAPSULE(300 MG) BY MOUTH TWICE DAILY   glimepiride (AMARYL) 4 MG tablet Take 1 tablet (4 mg total) by mouth daily before breakfast.   Glucagon (GVOKE HYPOPEN 2-PACK) 1 MG/0.2ML SOAJ Inject 1 Act into the skin daily as needed.   glucose blood test strip  Use Accu Chek Aviva Test Strips as instructed to check blood sugar three times daily.   insulin aspart protamine - aspart (NOVOLOG MIX 70/30 FLEXPEN) (70-30) 100 UNIT/ML FlexPen Inject 20 units under the skin breakfast and 26 units at dinner. (Patient taking differently: Inject 20 units under the skin breakfast and 26 units at dinner.)   Insulin Pen Needle (B-D UF III MINI PEN NEEDLES) 31G X 5 MM MISC ADMINISTER 30 UNITS EVERY EVENING   Multiple Vitamin (MULTIVITAMIN WITH MINERALS) TABS tablet Take 1 tablet by mouth daily.   rivaroxaban (XARELTO) 10 MG TABS tablet Take 1 tablet (10 mg total) by mouth daily.   simvastatin (ZOCOR) 40 MG tablet TAKE 1 TABLET(40 MG) BY MOUTH AT BEDTIME   No facility-administered encounter medications on file as of 05/27/2021.    Recent Office Vitals: BP Readings from Last 3 Encounters:  05/09/21 (!) 185/97  05/09/21 121/85  05/07/21 140/80   Pulse Readings from Last 3 Encounters:  05/09/21 67  05/09/21 89  05/07/21 76    Wt Readings from Last 3 Encounters:  05/07/21 195 lb 9.6 oz (88.7 kg)  04/27/21 197 lb 3.2 oz (89.4 kg)  02/18/21 194 lb 12.8 oz (88.4 kg)     Kidney Function Lab Results  Component Value Date/Time   CREATININE 0.97 05/09/2021 01:41 PM   CREATININE 0.99 04/15/2021 02:41 PM   CREATININE 1.04 03/24/2017 10:49 AM   CREATININE 0.88 12/23/2016 09:22 AM   GFR 74.72 04/15/2021 02:41 PM   GFRNONAA >60 05/09/2021 01:41 PM   GFRNONAA 87 12/23/2016 09:22 AM  GFRAA >89 12/23/2016 09:22 AM    BMP Latest Ref Rng & Units 05/09/2021 04/15/2021 02/05/2021  Glucose 70 - 99 mg/dL 271(H) 210(H) 313(H)  BUN 8 - 23 mg/dL '10 11 23  ' Creatinine 0.61 - 1.24 mg/dL 0.97 0.99 1.07  Sodium 135 - 145 mmol/L 138 142 139  Potassium 3.5 - 5.1 mmol/L 4.0 3.9 4.2  Chloride 98 - 111 mmol/L 106 107 107  CO2 22 - 32 mmol/L '24 25 24  ' Calcium 8.9 - 10.3 mg/dL 9.3 9.5 9.6      Recent Relevant Labs: Lab Results  Component Value Date/Time   HGBA1C 9.4 (H)  04/15/2021 02:41 PM   HGBA1C 9.7 (H) 02/05/2021 02:07 PM   MICROALBUR <0.7 11/07/2020 12:08 PM   MICROALBUR <0.7 02/04/2020 02:28 PM    Kidney Function Lab Results  Component Value Date/Time   CREATININE 0.97 05/09/2021 01:41 PM   CREATININE 0.99 04/15/2021 02:41 PM   CREATININE 1.04 03/24/2017 10:49 AM   CREATININE 0.88 12/23/2016 09:22 AM   GFR 74.72 04/15/2021 02:41 PM   GFRNONAA >60 05/09/2021 01:41 PM   GFRNONAA 87 12/23/2016 09:22 AM   GFRAA >89 12/23/2016 09:22 AM     Contacted patient on 05/27/21 to discuss diabetes disease state.   Current antihyperglycemic regimen:  Farxiga 10 mg 1 tab daily Glimepiride 4 mg 1 tab daily   Patient verbally confirms he is taking the above medications as directed. Yes  What diet changes have been made to improve diabetes control? Patient states that he has not changed his diet, eats whatever he cooks  What recent interventions/DTPs have been made to improve glycemic control:  None noted  Have there been any recent hospitalizations or ED visits since last visit with [facility]? Yes  Patient denies hypoglycemic symptoms, including None  Patient denies hyperglycemic symptoms, including none  How often are you checking your blood sugar? Patient states he checks blood sugar every other day  What are your blood sugars ranging? 200-280, sometimes it gets over 300. Patient states he would like to know how to get blood sugar to come down. Fasting: NA Before meals: Patient checks After meals: NA Bedtime: patient checks  During the week, how often does your blood glucose drop below 70?  Patient is not sure  Are you checking your feet daily/regularly? Yes  Adherence Review: Is the patient currently on a STATIN medication? Yes Is the patient currently on ACE/ARB medication? No Does the patient have >5 day gap between last estimated fill dates? Yes (Patient states that he is not sure the last time he got meds refill, can't see date on  bottle) Care Gaps: Last annual wellness visit:11/07/20 Last eye exam / retinopathy screening:none noted Last diabetic foot exam:07/09/20  Star Rating Drugs:  Medication:  Last Fill: Day Supply Simvastatin 40 mg 04/08/21   90 Glimepiride 4 mg 04/09/21  90 Farxiga 10 mg  12/15/20 90 No appointments scheduled within the next 30 days.   [facility] Pharmacist Assistant [phone]   Time spent:40

## 2021-06-02 ENCOUNTER — Ambulatory Visit (INDEPENDENT_AMBULATORY_CARE_PROVIDER_SITE_OTHER): Payer: Medicare Other | Admitting: Pharmacist

## 2021-06-02 ENCOUNTER — Other Ambulatory Visit: Payer: Self-pay | Admitting: Internal Medicine

## 2021-06-02 DIAGNOSIS — Z794 Long term (current) use of insulin: Secondary | ICD-10-CM | POA: Diagnosis not present

## 2021-06-02 DIAGNOSIS — E7800 Pure hypercholesterolemia, unspecified: Secondary | ICD-10-CM | POA: Diagnosis not present

## 2021-06-02 DIAGNOSIS — E1165 Type 2 diabetes mellitus with hyperglycemia: Secondary | ICD-10-CM

## 2021-06-02 DIAGNOSIS — E785 Hyperlipidemia, unspecified: Secondary | ICD-10-CM

## 2021-06-02 NOTE — Progress Notes (Signed)
Chronic Care Management Pharmacy Note  06/02/2021 Name:  Charles Parker MRN:  [id number] DOB:  [birth year]  Summary: -Pt endorses compliance with medications; however Wilder Glade has not been filled sin Jan 2022 and Novolog is overdue for refill as well, pt is likely missing doses of both -BG range 200-280, sometimes over 300 (checking ~every other day)  Recommendations/Changes made from today's visit: -Contacted [facility] to refill Novolog and Farxiga; pt agreed to pick up medications -Counseled on BID insulin dosing; advised him to take his insulin dose immediately if BG > 250 -Advised pt to contact his endocrinologist (Dr Dwyane Dee) if BG is >350 -Pt may benefit from GLP-1 agonist - will consult with endocrine   Subjective: Charles Parker is an 75 y.o. year old male who is a primary patient of Janith Lima, MD.  The CCM team was consulted for assistance with disease management and care coordination needs.    Engaged with patient by telephone for follow up visit in response to provider referral for pharmacy case management and/or care coordination services.   Consent to Services:  The patient was given information about Chronic Care Management services, agreed to services, and gave verbal consent prior to initiation of services.  Please see initial visit note for detailed documentation.   Patient Care Team: Janith Lima, MD as PCP - General (Internal Medicine) Wilford Corner, MD as Consulting Physician (Gastroenterology) Philemon Kingdom, MD as Consulting Physician (Internal Medicine) Gardiner Barefoot, DPM as Consulting Physician (Podiatry) Charlton Haws, [facility] as Pharmacist (Pharmacist) Warden Fillers, MD as Consulting Physician (Ophthalmology)   Patient lives at home alone. His wife has passed. He is working on improving blood sugars by walking, watching what he eats and taking medicines as prescribed.   Recent office visits: 02/05/21 Dr [name] OV: ordered cardiac  event monitor to evaluate fluttering. Ordered [employer].  11/07/20 Dr [name] OV: CPX, uncontrolled BG. A1c 13.5%. Rec'd against SGLT2 due to high A1c, dc'd [place]. Started glimepiride 4 mg, Freestyle Libre, [place], Vitamin D. Referred to nutrition.  Recent consult visits: 04/27/21 Dr Dwyane Dee (endocrine): A1c 9.4. Check fasting BG 3-4 days a week. Check post-prandial BG. No med changes.  02/27/21 Levie Heritage RD (Nutrition): DM counseling.  11/13/20 Dr Dwyane Dee (endocrine): increase insulin to 40 units AM and 30 units PM. Resume Wilder Glade.  11/12/20 RD Levie Heritage (nutrition consult)  Hospital visits: Medication Reconciliation was completed by comparing discharge summary, patient's EMR and Pharmacy list, and upon discussion with patient.  Admitted to the ED on 05/09/21 due to Neck pain. Discharge date was 05/09/21. Discharged from [place] scan negative for acute abnormality  Medications that remain the same after Hospital Discharge:??  -All other medications will remain the same.    Objective:  Lab Results  Component Value Date   CREATININE 0.97 05/09/2021   BUN 10 05/09/2021   GFR 74.72 04/15/2021   GFRNONAA >60 05/09/2021   GFRAA >89 12/23/2016   NA 138 05/09/2021   K 4.0 05/09/2021   CALCIUM 9.3 05/09/2021   CO2 24 05/09/2021    Lab Results  Component Value Date/Time   HGBA1C 9.4 (H) 04/15/2021 02:41 PM   HGBA1C 9.7 (H) 02/05/2021 02:07 PM   FRUCTOSAMINE 660 (H) 10/28/2020 08:47 AM   FRUCTOSAMINE 361 (H) 07/04/2020 01:42 PM   GFR 74.72 04/15/2021 02:41 PM   GFR 68.15 02/05/2021 02:07 PM   MICROALBUR <0.7 11/07/2020 12:08 PM   MICROALBUR <0.7 02/04/2020 02:28 PM    Last diabetic  Eye exam:  Lab Results  Component Value Date/Time   HMDIABEYEEXA No Retinopathy 04/10/2019 12:00 AM    Last diabetic Foot exam:  Lab Results  Component Value Date/Time   HMDIABFOOTEX No neuropathy 07/09/2020 12:00 AM     Lab Results  Component Value Date   CHOL 153  08/19/2020   HDL 43.00 08/19/2020   LDLCALC 84 08/19/2020   LDLDIRECT 115.0 07/02/2019   TRIG 130.0 08/19/2020   CHOLHDL 4 08/19/2020    Hepatic Function Latest Ref Rng & Units 08/19/2020 05/12/2020 05/12/2020  Total Protein 6.0 - 8.3 g/dL 7.0 - 7.5  Albumin 3.5 - 5.2 g/dL 4.1 4.4 4.4  AST 0 - 37 U/L 18 - 18  ALT 0 - 53 U/L 18 - 23  Alk Phosphatase 39 - 117 U/L 58 - 61  Total Bilirubin 0.2 - 1.2 mg/dL 0.7 - 0.4    Lab Results  Component Value Date/Time   TSH 1.55 11/07/2020 12:08 PM   TSH 0.67 09/08/2016 03:13 PM    CBC Latest Ref Rng & Units 05/09/2021 02/05/2021 11/07/2020  WBC 4.0 - 10.5 K/uL 6.2 4.6 3.9(L)  Hemoglobin 13.0 - 17.0 g/dL 14.9 14.6 15.0  Hematocrit 39.0 - 52.0 % 44.9 42.4 44.3  Platelets 150 - 400 K/uL 141(L) 143.0(L) 134.0(L)    Lab Results  Component Value Date/Time   VD25OH 21.10 (L) 11/07/2020 12:08 PM   VD25OH 37.61 03/29/2019 02:48 PM    Clinical ASCVD: No  The 10-year ASCVD risk score Mikey Bussing DC Jr., et al., 2013) is: 41.3%   Values used to calculate the score:     Age: 3 years     Sex: Male     Is Non-Hispanic African American: Yes     Diabetic: Yes     Tobacco smoker: No     Systolic Blood Pressure: 147 mmHg     Is BP treated: No     HDL Cholesterol: 43 mg/dL     Total Cholesterol: 153 mg/dL    Depression screen [facility] 2/9 05/07/2021 11/12/2020 12/12/2019  Decreased Interest 0 0 0  Down, Depressed, Hopeless 0 0 0  PHQ - 2 Score 0 0 0  Altered sleeping - - -  Tired, decreased energy - - -  Change in appetite - - -  Feeling bad or failure about yourself  - - -  Trouble concentrating - - -  Moving slowly or fidgety/restless - - -  Suicidal thoughts - - -  PHQ-9 Score - - -  Difficult doing work/chores - - -  Some recent data might be hidden     Social History   Tobacco Use  Smoking Status Never  Smokeless Tobacco Never   BP Readings from Last 3 Encounters:  05/09/21 (!) 185/97  05/09/21 121/85  05/07/21 140/80   Pulse Readings from Last  3 Encounters:  05/09/21 67  05/09/21 89  05/07/21 76   Wt Readings from Last 3 Encounters:  05/07/21 195 lb 9.6 oz (88.7 kg)  04/27/21 197 lb 3.2 oz (89.4 kg)  02/18/21 194 lb 12.8 oz (88.4 kg)    Assessment/Interventions: Review of patient past medical history, allergies, medications, health status, including review of consultants reports, laboratory and other test data, was performed as part of comprehensive evaluation and provision of chronic care management services.   SDOH:  (Social Determinants of Health) assessments and interventions performed: Yes    CCM Care Plan  Allergies  Allergen Reactions   Metformin And Related Diarrhea   Codeine  Rash    All over the body    Medications Reviewed Today     Reviewed by Charlton Haws, [facility] (Pharmacist) on 06/02/21 at 38  Med List Status: <None>   Medication Order Taking? Sig Documenting Provider Last Dose Status Informant  Alcohol Swabs PADS [id number] Yes Use to clean area before insulin injections. DX E11.8 Elayne Snare, MD Taking Active   blood glucose meter kit and supplies KIT [id number] Yes 1 each by Does not apply route daily as needed. Accu-Chek Aviva preferred by patient. Use up to four times daily as directed. (FOR ICD-9 250.00, 250.01). [provider] Taking Active   Cholecalciferol 50 MCG (2000 UT) TABS [id number] Yes Take 1 tablet (2,000 Units total) by mouth daily. Janith Lima, MD Taking Active   Continuous Blood Gluc Receiver (FREESTYLE LIBRE 2 READER) DEVI [id number] Yes 1 Act by Does not apply route daily. Janith Lima, MD Taking Active   Continuous Blood Gluc Sensor (FREESTYLE LIBRE 2 SENSOR) [place] [id number] Yes 1 Act by Does not apply route daily. Janith Lima, MD Taking Active   dapagliflozin propanediol (FARXIGA) 10 MG TABS tablet [id number] Yes Take 1 tablet (10 mg total) by mouth daily. Elayne Snare, MD Taking Active   gabapentin (NEURONTIN) 300 MG capsule [id number] Yes TAKE 1 CAPSULE(300 MG)  BY MOUTH TWICE DAILY Elayne Snare, MD Taking Active   glimepiride (AMARYL) 4 MG tablet [id number] Yes Take 1 tablet (4 mg total) by mouth daily before breakfast. Janith Lima, MD Taking Active   Glucagon (GVOKE HYPOPEN 2-PACK) 1 MG/0.2ML [place] Palmer [id number] Yes Inject 1 Act into the skin daily as needed. Janith Lima, MD Taking Active   glucose blood test strip [id number] Yes Use Accu Chek Aviva Test Strips as instructed to check blood sugar three times daily. Elayne Snare, MD Taking Active   insulin aspart protamine - aspart (NOVOLOG MIX 70/30 FLEXPEN) (70-30) 100 UNIT/ML FlexPen [id number] Yes Inject 20 units under the skin breakfast and 26 units at dinner.  Patient taking differently: Inject 26 units under the skin breakfast and 40 units at dinner.   Janith Lima, MD Taking Active Self  Insulin Pen Needle (B-D UF III MINI PEN NEEDLES) 31G X 5 MM MISC [id number] Yes ADMINISTER 30 UNITS EVERY Sharol Given, MD Taking Active   Multiple Vitamin (MULTIVITAMIN WITH MINERALS) TABS tablet [id number] Yes Take 1 tablet by mouth daily. [provider] Taking Active   rivaroxaban (XARELTO) 10 MG TABS tablet [id number] Yes Take 1 tablet (10 mg total) by mouth daily. Janith Lima, MD Taking Active   simvastatin (ZOCOR) 40 MG tablet [id number] Yes TAKE 1 TABLET(40 MG) BY MOUTH AT BEDTIME Janith Lima, MD Taking Active             Patient Active Problem List   Diagnosis Date Noted   Thrombocytopenia ([place]) 02/05/2021   Rapid palpitations 02/05/2021   Encounter for general adult medical examination with abnormal findings 11/07/2020   Elevated factor VIII level 10/08/2019   Candidal balanitis 03/29/2019   Erectile dysfunction due to diabetes mellitus ([place]) 03/29/2019   Essential hypertension 05/18/2018   Vitamin D insufficiency 05/18/2018   Hallux rigidus, right foot 02/14/2017   Degenerative arthritis of left knee 01/11/2017   OAB (overactive bladder) 01/09/2017   Medication  monitoring encounter 12/23/2016   Iron deficiency 12/16/2016   Type 2 diabetes mellitus with complication, with long-term current use of insulin ([place]) 12/15/2016   Hyperlipidemia with target LDL less than  70 10/21/2016   Thoracic spine fracture ([place]) 10/21/2016   Benign prostatic hyperplasia (BPH) with straining on urination 06/20/2013   Tubulovillous adenoma of colon with high grade dysplasia (rectosigmoid) s/p LAR with bladder repair 02/03/12. 12/27/2011    Immunization History  Administered Date(s) Administered   Fluad Quad(high Dose 65+) 07/17/2019, 07/08/2020   Influenza, High Dose Seasonal PF 06/13/2017, 07/06/2018   Influenza,inj,Quad PF,6+ Mos 06/20/2013, 07/10/2014   Influenza-Unspecified 06/05/2015, 07/26/2016   PFIZER(Purple Top)SARS-COV-2 Vaccination 10/26/2019, 11/16/2019, 07/19/2020   PPD Test 10/29/2016   Pneumococcal Conjugate-13 06/20/2013, 07/23/2014   Pneumococcal Polysaccharide-23 06/19/2015, 02/05/2021   Tdap 06/20/2013   Zoster Recombinat (Shingrix) 07/17/2019    Conditions to be addressed/monitored:  Hypertension, Hyperlipidemia, Diabetes, Osteoarthritis and Hypercoaguable state  Care Plan : [place]  Updates made by Charlton Haws, [place] since 06/02/2021 12:00 AM     Problem: Hypertension, Hyperlipidemia, Diabetes, Osteoarthritis and Hypercoaguable state   Priority: High     Long-Range Goal: Disease management   Start Date: 11/17/2020  Expected End Date: 05/17/2021  This Visit's Progress: Not on track  Recent Progress: On track  Priority: High  Note:   Current Barriers:  Unable to independently monitor therapeutic efficacy Unable to achieve control of diabetes  Unable to self administer medications as prescribed  Pharmacist Clinical Goal(s):  Patient will achieve adherence to monitoring guidelines and medication adherence to achieve therapeutic efficacy achieve control of diabetes as evidenced by improvement in A1c achieve ability  to self administer medications as prescribed through use of insulin as evidenced by patient report through collaboration with PharmD and provider.   Interventions: 1:1 collaboration with Janith Lima, MD regarding development and update of comprehensive plan of care as evidenced by provider attestation and co-signature Inter-disciplinary care team collaboration (see longitudinal plan of care) Comprehensive medication review performed; medication list updated in electronic medical record  Hyperlipidemia: (LDL goal < 100) -Controlled  - LDL is at goal; pt endorses compliance with statin -Current treatment: Simvastatin 40 mg daily HS -Medications previously tried: ezetimibe -Educated on Cholesterol goals; Benefits of statin for ASCVD risk reduction; -Recommended to continue current medication  Diabetes (A1c goal <8%) -Uncontrolled - but improving. Pt reports compliance with meds, however Wilder Glade has not been filled since Jan 2022 per dispense report and Novolog is overdue if he is truly taking as prescribed -Dx 2014. Insulin since 2016. -Current home glucose readings fasting glucose: 200-280 post prandial glucose: 300s -Current medications: Glimepiride 4 mg daily (started 2016) Farxiga 10 mg daily Novolog 70/30 - 26 units AM, 38-40 units PM Gvoke Hypopen Accu-Chek glucometer -Medications previously tried: metformin (hives), Lantus, Januvia, Invokana -Current meal patterns: seen nutritionist, trying to "lay off the sweets" -Current exercise: walking most days -Educated on A1c and blood sugar goals; Proper insulin injection technique; Benefits of routine self-monitoring of blood sugar; -Counseled extensively on adherence to medications -Pt may benefit from GLP-1 agonist -will consult with endocrine  Hypercoaguable state/Hx DVT (Goal: prevent subsequent DVT) -Controlled - pt will be on Xarelto indefinitely due to Factor VIII elevation -Current treatment  Xarelto 10 mg  daily -Recommended to continue current medication  Patient Goals/Self-Care Activities Patient will:  - take medications as prescribed -focus on medication adherence by establishing routine -check glucose most days, document, and provide at future appointments -target a minimum of 150 minutes of moderate intensity exercise weekly -engage in dietary modifications by following nutritionist's recommendations        Medication Assistance: None required.  Patient affirms current coverage  meets needs.  Patient's preferred pharmacy is:  [facility] [place], [place] - [address] AT [address] [place] [address] Phone: [phone] Fax: [phone]  [id number] - [place], [place] - [address] [place] [place] [address] Phone: [phone] Fax: [phone]  Uses pill box? No - prefers bottles Pt endorses 90% compliance - forgets to refill meds sometimes  We discussed: Current pharmacy is preferred with insurance plan and patient is satisfied with pharmacy services Patient decided to: Continue current medication management strategy  Care Plan and Follow Up Patient Decision:  Patient agrees to Care Plan and Follow-up.  Plan: Telephone follow up appointment with care management team member scheduled for:  2 months  Charlene Brooke, PharmD, [place], CPP Clinical Pharmacist [facility] at [facility] [phone]

## 2021-06-02 NOTE — Progress Notes (Signed)
    Chronic Care Management Pharmacy Assistant   Name: Charles Parker  MRN: [id number] DOB: 12/06/1945  [place] [facility] [place] for refills on [place] and [place] for [name].  [place] Pharmacist Assistant [phone]   Time spent:9

## 2021-06-02 NOTE — Patient Instructions (Signed)
Visit Information  Phone number for Pharmacist: [phone]   Goals Addressed             This Visit's Progress    Manage My Medicine       Timeframe:  Long-Range Goal Priority:  High Start Date:    06/02/21                          Expected End Date:      06/02/22                 Follow Up Date Oct 2022   - call for medicine refill 2 or 3 days before it runs out - call if I am sick and can't take my medicine - keep a list of all the medicines I take; vitamins and herbals too  -Refill Novolog and Farxiga at [facility]   Why is this important?   These steps will help you keep on track with your medicines.   Notes:         Care Plan : [place]  Updates made by Charlton Haws, RPH since 06/02/2021 12:00 AM     Problem: Hypertension, Hyperlipidemia, Diabetes, Osteoarthritis and Hypercoaguable state   Priority: High     Long-Range Goal: Disease management   Start Date: 11/17/2020  Expected End Date: 05/17/2021  This Visit's Progress: Not on track  Recent Progress: On track  Priority: High  Note:   Current Barriers:  Unable to independently monitor therapeutic efficacy Unable to achieve control of diabetes  Unable to self administer medications as prescribed  Pharmacist Clinical Goal(s):  Patient will achieve adherence to monitoring guidelines and medication adherence to achieve therapeutic efficacy achieve control of diabetes as evidenced by improvement in A1c achieve ability to self administer medications as prescribed through use of insulin as evidenced by patient report through collaboration with PharmD and provider.   Interventions: 1:1 collaboration with Janith Lima, MD regarding development and update of comprehensive plan of care as evidenced by provider attestation and co-signature Inter-disciplinary care team collaboration (see longitudinal plan of care) Comprehensive medication review performed; medication list updated in electronic  medical record  Hyperlipidemia: (LDL goal < 100) -Controlled  - LDL is at goal; pt endorses compliance with statin -Current treatment: Simvastatin 40 mg daily HS -Medications previously tried: ezetimibe -Educated on Cholesterol goals; Benefits of statin for ASCVD risk reduction; -Recommended to continue current medication  Diabetes (A1c goal <8%) -Uncontrolled - but improving. Pt reports compliance with meds, however Wilder Glade has not been filled since Jan 2022 per dispense report and Novolog is overdue if he is truly taking as prescribed -Dx 2014. Insulin since 2016. -Current home glucose readings fasting glucose: 200-280 post prandial glucose: 300s -Current medications: Glimepiride 4 mg daily (started 2016) Farxiga 10 mg daily Novolog 70/30 - 26 units AM, 38-40 units PM Gvoke Hypopen Accu-Chek glucometer -Medications previously tried: metformin (hives), Lantus, Januvia, Invokana -Current meal patterns: seen nutritionist, trying to "lay off the sweets" -Current exercise: walking most days -Educated on A1c and blood sugar goals; Proper insulin injection technique; Benefits of routine self-monitoring of blood sugar; -Counseled extensively on adherence to medications -Pt may benefit from GLP-1 agonist -will consult with endocrine  Hypercoaguable state/Hx DVT (Goal: prevent subsequent DVT) -Controlled - pt will be on Xarelto indefinitely due to Factor VIII elevation -Current treatment  Xarelto 10 mg daily -Recommended to continue current medication  Patient Goals/Self-Care Activities Patient will:  -  take medications as prescribed -focus on medication adherence by establishing routine -check glucose most days, document, and provide at future appointments -target a minimum of 150 minutes of moderate intensity exercise weekly -engage in dietary modifications by following nutritionist's recommendations       Patient verbalizes understanding of instructions provided today and  agrees to view in [place].  Telephone follow up appointment with pharmacy team member scheduled for: 2 months  Charlene Brooke, PharmD, [place], CPP Clinical Pharmacist [facility] at [facility] [phone]

## 2021-06-25 ENCOUNTER — Other Ambulatory Visit: Payer: Medicare Other

## 2021-06-25 ENCOUNTER — Other Ambulatory Visit (INDEPENDENT_AMBULATORY_CARE_PROVIDER_SITE_OTHER): Payer: Medicare Other

## 2021-06-25 ENCOUNTER — Telehealth: Payer: Self-pay | Admitting: Pharmacist

## 2021-06-25 DIAGNOSIS — E1165 Type 2 diabetes mellitus with hyperglycemia: Secondary | ICD-10-CM | POA: Diagnosis not present

## 2021-06-25 DIAGNOSIS — Z794 Long term (current) use of insulin: Secondary | ICD-10-CM | POA: Diagnosis not present

## 2021-06-25 LAB — BASIC METABOLIC PANEL
BUN: 13 mg/dL (ref 6–23)
CO2: 22 mEq/L (ref 19–32)
Calcium: 9.8 mg/dL (ref 8.4–10.5)
Chloride: 105 mEq/L (ref 96–112)
Creatinine, Ser: 1.14 mg/dL (ref 0.40–1.50)
GFR: 62.99 mL/min (ref >60.00)
Glucose, Bld: 317 mg/dL — ABNORMAL HIGH (ref 70–99)
Potassium: 3.9 mEq/L (ref 3.5–5.1)
Sodium: 138 mEq/L (ref 135–145)

## 2021-06-25 NOTE — Progress Notes (Signed)
Chronic Care Management Pharmacy Assistant   Name: Charles Parker  MRN: [id number] DOB: 1946-09-10   Reason for Encounter: Disease State   Conditions to be addressed/monitored: DMII   Recent office visits:  None ID  Recent consult visits:  None ID  Hospital visits:  Medication Reconciliation was completed by comparing discharge summary, patient's EMR and Pharmacy list, and upon discussion with patient.  Admitted to the hospital on 05/09/21 due to neck pain. Discharge date was 05/09/21. Discharged from [facility] ED.   Medications that remain the same after Hospital Discharge:??  -All other medications will remain the same.    Medications: Outpatient Encounter Medications as of 06/25/2021  Medication Sig   Alcohol Swabs PADS Use to clean area before insulin injections. DX E11.8   blood glucose meter kit and supplies KIT 1 each by Does not apply route daily as needed. Accu-Chek Aviva preferred by patient. Use up to four times daily as directed. (FOR ICD-9 250.00, 250.01).   Cholecalciferol 50 MCG (2000 UT) TABS Take 1 tablet (2,000 Units total) by mouth daily.   Continuous Blood Gluc Receiver (FREESTYLE LIBRE 2 READER) DEVI 1 Act by Does not apply route daily.   Continuous Blood Gluc Sensor (FREESTYLE LIBRE 2 SENSOR) MISC 1 Act by Does not apply route daily.   dapagliflozin propanediol (FARXIGA) 10 MG TABS tablet Take 1 tablet (10 mg total) by mouth daily.   gabapentin (NEURONTIN) 300 MG capsule TAKE 1 CAPSULE(300 MG) BY MOUTH TWICE DAILY   glimepiride (AMARYL) 4 MG tablet Take 1 tablet (4 mg total) by mouth daily before breakfast.   Glucagon (GVOKE HYPOPEN 2-PACK) 1 MG/0.2ML SOAJ Inject 1 Act into the skin daily as needed.   glucose blood test strip Use Accu Chek Aviva Test Strips as instructed to check blood sugar three times daily.   insulin aspart protamine - aspart (NOVOLOG MIX 70/30 FLEXPEN) (70-30) 100 UNIT/ML FlexPen Inject 20 units under the skin  breakfast and 26 units at dinner. (Patient taking differently: Inject 26 units under the skin breakfast and 40 units at dinner.)   Insulin Pen Needle (B-D UF III MINI PEN NEEDLES) 31G X 5 MM MISC ADMINISTER 30 UNITS EVERY EVENING   Multiple Vitamin (MULTIVITAMIN WITH MINERALS) TABS tablet Take 1 tablet by mouth daily.   rivaroxaban (XARELTO) 10 MG TABS tablet Take 1 tablet (10 mg total) by mouth daily.   simvastatin (ZOCOR) 40 MG tablet TAKE 1 TABLET(40 MG) BY MOUTH AT BEDTIME   No facility-administered encounter medications on file as of 06/25/2021.    Recent Relevant Labs: Lab Results  Component Value Date/Time   HGBA1C 9.4 (H) 04/15/2021 02:41 PM   HGBA1C 9.7 (H) 02/05/2021 02:07 PM   MICROALBUR <0.7 11/07/2020 12:08 PM   MICROALBUR <0.7 02/04/2020 02:28 PM    Kidney Function Lab Results  Component Value Date/Time   CREATININE 0.97 05/09/2021 01:41 PM   CREATININE 0.99 04/15/2021 02:41 PM   CREATININE 1.04 03/24/2017 10:49 AM   CREATININE 0.88 12/23/2016 09:22 AM   GFR 74.72 04/15/2021 02:41 PM   GFRNONAA >60 05/09/2021 01:41 PM   GFRNONAA 87 12/23/2016 09:22 AM   GFRAA >89 12/23/2016 09:22 AM     Contacted patient on 06/25/21,06/26/21 and 06/30/21 to discuss diabetes disease state.   Current antihyperglycemic regimen:  Glimepiride 4 mg 1 tab daily Novolog 20 units at breakfast and 26 at dinner Farxiga 10 mg daily Glucagon 1 act daily as needed     What recent  interventions/DTPs have been made to improve glycemic control:  None noted  Have there been any recent hospitalizations or ED visits since last visit with CPP? Yes, for nect pain   Adherence Review: Is the patient currently on a STATIN medication? Yes Is the patient currently on ACE/ARB medication? No Does the patient have >5 day gap between last estimated fill dates? No  Care Gaps: Annual wellness visit in last year? No Most Recent BP reading:185/97 05/09/21  If Diabetic: Most recent A1C reading:9.4  04/15/21 Last eye exam / retinopathy screening:none noted Last diabetic foot exam:07/09/20  Star Rating Drugs:  Medication:  Last Fill: Day Supply Simvastatin 40 mg 04/08/21  90  Endocrinology appointment on 06/29/21 Dr. Henderson Cloud [facility] Clinical Pharmacist Assistant [phone]   Time spent:24

## 2021-06-26 LAB — FRUCTOSAMINE: Fructosamine: 546 umol/L — ABNORMAL HIGH (ref 0–285)

## 2021-06-29 ENCOUNTER — Ambulatory Visit: Payer: Medicare Other | Admitting: Endocrinology

## 2021-07-02 ENCOUNTER — Ambulatory Visit (INDEPENDENT_AMBULATORY_CARE_PROVIDER_SITE_OTHER): Payer: Medicare Other | Admitting: Endocrinology

## 2021-07-02 ENCOUNTER — Other Ambulatory Visit: Payer: Self-pay

## 2021-07-02 VITALS — BP 130/90 | HR 82 | Ht 71.0 in | Wt 188.0 lb

## 2021-07-02 DIAGNOSIS — I10 Essential (primary) hypertension: Secondary | ICD-10-CM | POA: Diagnosis not present

## 2021-07-02 DIAGNOSIS — Z794 Long term (current) use of insulin: Secondary | ICD-10-CM | POA: Diagnosis not present

## 2021-07-02 DIAGNOSIS — E118 Type 2 diabetes mellitus with unspecified complications: Secondary | ICD-10-CM | POA: Diagnosis not present

## 2021-07-02 DIAGNOSIS — E1165 Type 2 diabetes mellitus with hyperglycemia: Secondary | ICD-10-CM | POA: Diagnosis not present

## 2021-07-02 MED ORDER — NOVOLOG MIX 70/30 FLEXPEN (70-30) 100 UNIT/ML ~~LOC~~ SUPN: PEN_INJECTOR | SUBCUTANEOUS | 2 refills | Status: DC

## 2021-07-02 MED ORDER — LOSARTAN POTASSIUM 50 MG PO TABS: 50.0000 mg | ORAL_TABLET | Freq: Every day | ORAL | 1 refills | Status: DC

## 2021-07-02 NOTE — Progress Notes (Signed)
Patient ID: Charles Parker, male   DOB: 1946/02/07, 75 y.o.   MRN: [id number]          Reason for Appointment: Follow-up for Type 2 Diabetes  Referring physician: Scarlette Calico   History of Present Illness:          Date of diagnosis of type 2 diabetes mellitus: 2014        Background history:   He has been on various oral hypoglycemic drugs since the onset including metformin, Januvia and Amaryl Was also on Invokana in 2015 for about a year but not clear why this was stopped His level of control has been quite variable with highest A1c 13.6 in 06/2015  He has been on insulin since about 10/2014 as documented in his record  Recent history:   INSULIN regimen is:  NovoLog mix 40 units before breakfast and 36 units before dinner  Non-insulin hypoglycemic drugs the patient is taking PNT:IRWERXV 10 mg daily  His A1c is last 9.4 Fructosamine is high at 546   Current management, blood sugar patterns and problems identified: He is still not taking his insulin regularly and not clear why he keeps running out of his medication His glucose was 317 in the lab and he has not checked any readings for the last 5 weeks, previously blood sugars were 366 and 419 on his meter Is also giving variable answers about taking Farxiga Unclear what insulin doses he is taking as he gives different answers on each visit Previously difficult to adjust his insulin because of lack of monitoring as well as inconsistent compliance He will periodically be eating out and not able to follow diet consistently However recently weight appears to be going down  Side effects from medications have been: Hives from metformin  Compliance with the medical regimen: Inconsistent  Glucose monitoring:  done< 1 times a   day usually        Glucometer: Accu-Chek guide       Blood Glucose readings none recently   Prior:  Fasting range 123-185, median 151    Self-care: The diet that the patient has been following is:  tries to limit [employer] .      Typical meal intake: Breakfast is eggs/cereal or oatmeal usually, lunch:  Lean cuisine or sandwiches.  Dinner chicken and vegetables.   Snacks will be chips, peanut butter crackers Supper at 6 pm                Dietician visit, most recent:3/19               Weight history:  Wt Readings from Last 3 Encounters:  07/02/21 188 lb (85.3 kg)  05/07/21 195 lb 9.6 oz (88.7 kg)  04/27/21 197 lb 3.2 oz (89.4 kg)    Glycemic control:   Lab Results  Component Value Date   HGBA1C 9.4 (H) 04/15/2021   HGBA1C 9.7 (H) 02/05/2021   HGBA1C 13.5 (A) 11/07/2020   Lab Results  Component Value Date   MICROALBUR <0.7 11/07/2020   LDLCALC 84 08/19/2020   CREATININE 1.14 06/25/2021   Lab Results  Component Value Date   MICRALBCREAT 1.4 11/07/2020    Lab Results  Component Value Date   FRUCTOSAMINE 546 (H) 06/25/2021   FRUCTOSAMINE 660 (H) 10/28/2020   FRUCTOSAMINE 361 (H) 07/04/2020      Allergies as of 07/02/2021       Reactions   Metformin And Related Diarrhea   Codeine Rash   All  over the body        Medication List        Accurate as of July 02, 2021  4:30 PM. If you have any questions, ask your nurse or doctor.          Alcohol Swabs Pads Use to clean area before insulin injections. DX E11.8   B-D UF III MINI PEN NEEDLES 31G X 5 MM Misc Generic drug: Insulin Pen Needle ADMINISTER 30 UNITS EVERY EVENING   blood glucose meter kit and supplies Kit 1 each by Does not apply route daily as needed. Accu-Chek Aviva preferred by patient. Use up to four times daily as directed. (FOR ICD-9 250.00, 250.01).   Cholecalciferol 50 MCG (2000 UT) Tabs Take 1 tablet (2,000 Units total) by mouth daily.   dapagliflozin propanediol 10 MG Tabs tablet Commonly known as: Farxiga Take 1 tablet (10 mg total) by mouth daily.   FreeStyle Libre 2 Reader [place] 1 Act by Does not apply route daily.   FreeStyle Libre 2 Sensor Misc 1 Act by Does  not apply route daily.   gabapentin 300 MG capsule Commonly known as: NEURONTIN TAKE 1 CAPSULE(300 MG) BY MOUTH TWICE DAILY   glimepiride 4 MG tablet Commonly known as: AMARYL Take 1 tablet (4 mg total) by mouth daily before breakfast.   glucose blood test strip Use Accu Chek Aviva Test Strips as instructed to check blood sugar three times daily.   Gvoke HypoPen 2-Pack 1 MG/0.2ML Soaj Generic drug: Glucagon Inject 1 Act into the skin daily as needed.   losartan 50 MG tablet Commonly known as: COZAAR Take 1 tablet (50 mg total) by mouth daily. Started by: Elayne Snare, MD   multivitamin with minerals Tabs tablet Take 1 tablet by mouth daily.   NovoLOG Mix 70/30 FlexPen (70-30) 100 UNIT/ML FlexPen Generic drug: insulin aspart protamine - aspart Inject 40 units at breakfast and 36 units at dinner. What changed: additional instructions Changed by: Elayne Snare, MD   rivaroxaban 10 MG Tabs tablet Commonly known as: Xarelto Take 1 tablet (10 mg total) by mouth daily.   simvastatin 40 MG tablet Commonly known as: ZOCOR TAKE 1 TABLET(40 MG) BY MOUTH AT BEDTIME        Allergies:  Allergies  Allergen Reactions   Metformin And Related Diarrhea   Codeine Rash    All over the body    Past Medical History:  Diagnosis Date   Clotting disorder ([place])    DVT   Diabetes mellitus    type II   Diverticulosis    DVT (deep venous thrombosis) ([facility])    Glaucoma    no eye drops    Hepatic cyst    Hypercholesterolemia    Hypertension    Renal cyst    Sleep apnea    Tubulovillous adenoma     Past Surgical History:  Procedure Laterality Date   BLADDER NECK RECONSTRUCTION  02/03/2012   Procedure: BLADDER NECK REPAIR;  Surgeon: Adin Hector, MD;  Location: [facility];  Service: General;;   COLONOSCOPY  02/2013   polyp removal(mult.)   KNEE SURGERY  2004   left   POLYPECTOMY     PROCTOSCOPY  02/03/2012   Procedure: PROCTOSCOPY;  Surgeon: Adin Hector, MD;  Location: [facility];   Service: General;;   STOMACH SURGERY  02/2012   TEE WITHOUT CARDIOVERSION N/A 10/05/2016   Procedure: TRANSESOPHAGEAL ECHOCARDIOGRAM (TEE);  Surgeon: Sanda Klein, MD;  Location: [place];  Service: Cardiovascular;  Laterality:  N/A;    Family History  Problem Relation Age of Onset   Hypertension Mother    Diabetes Mother    Cancer Mother        ? location   Colon cancer Father 65   Colon polyps Neg Hx    Liver cancer Neg Hx     Social History:  reports that he has never smoked. He has never used smokeless tobacco. He reports that he does not drink alcohol and does not use drugs.   Review of Systems   Lipid history: Has been taking simvastatin 40 mg with variable control, followed by PCP    Lab Results  Component Value Date   CHOL 153 08/19/2020   CHOL 202 (H) 05/12/2020   CHOL 195 03/05/2019   Lab Results  Component Value Date   HDL 43.00 08/19/2020   HDL 50.80 05/12/2020   HDL 46.40 03/05/2019   Lab Results  Component Value Date   LDLCALC 84 08/19/2020   LDLCALC 124 (H) 05/12/2020   LDLCALC 119 (H) 03/05/2019   Lab Results  Component Value Date   TRIG 130.0 08/19/2020   TRIG 134.0 05/12/2020   TRIG 152.0 (H) 03/05/2019   Lab Results  Component Value Date   CHOLHDL 4 08/19/2020   CHOLHDL 4 05/12/2020   CHOLHDL 4 03/05/2019   Lab Results  Component Value Date   LDLDIRECT 115.0 07/02/2019   LDLDIRECT 118.0 11/11/2015   LDLDIRECT 201.0 03/04/2015           He has history of neuropathy taking gabapentin prescribed by PCP  Most recent foot exam: 2/22  RENAL function is again normal with continuing Farxiga 10 mg  Lab Results  Component Value Date   CREATININE 1.14 06/25/2021   CREATININE 0.97 05/09/2021   CREATININE 0.99 04/15/2021    Blood pressure : He appears to have relatively high blood pressure readings and not on any medications  BP Readings from Last 3 Encounters:  07/02/21 130/90  05/09/21 (!) 185/97  05/09/21 121/85    Has had  all 3 Covid shots  May have diabetic shoes for his foot deformities  Physical Examination:  BP 130/90   Pulse 82   Ht '5\' 11"'  (1.803 m)   Wt 188 lb (85.3 kg)   SpO2 98%   BMI 26.22 kg/m       ASSESSMENT:  Diabetes type 2 on insulin  See history of present illness for detailed discussion of current diabetes management, blood sugar patterns and problems identified   He is on a regimen of premixed insulin twice a day and Farxiga  His A1c is last significantly high at 9.4 Fructosamine is significantly higher 546 Lab glucose 317  He has consistent difficulty with compliance with his medications, monitoring and likely diet Also difficult to know what insulin doses he is taking as he has a different dose on each visit Unable to adjust his insulin because of lack of monitoring and consistent regimen Also not clear if he is taking [place] regularly   He was given a log sheet to keep a record his insulin and blood sugars and he still has not done his  HYPERTENSION associated with diabetes: Currently not on treatment and blood pressure was high check twice today   PLAN:    He was again given a log sheet to keep his insulin doses written down as well as his blood sugars  He will start checking his blood sugars twice daily  For now he will continue  the same doses of insulin that he reports  If he is able to get mail-order prescriptions that would be desirable  Start LOSARTAN 50 mg daily, discussed taking this for both hypertension and renal benefits  He needs to follow-up with the diabetes educator to make sure he is understanding the day-to-day routine  Follow-up in 2 months     There are no Patient Instructions on file for this visit.      Elayne Snare 07/02/2021, 4:30 PM   Note: This office note was prepared with Dragon voice recognition system technology. Any transcriptional errors that result from this process are unintentional.

## 2021-07-06 ENCOUNTER — Other Ambulatory Visit (HOSPITAL_COMMUNITY): Payer: Self-pay

## 2021-07-06 ENCOUNTER — Telehealth: Payer: Self-pay | Admitting: Pharmacy Technician

## 2021-07-06 ENCOUNTER — Other Ambulatory Visit: Payer: Self-pay | Admitting: Endocrinology

## 2021-07-06 MED ORDER — INSULIN LISPRO PROT & LISPRO (75-25 MIX) 100 UNIT/ML KWIKPEN: PEN_INJECTOR | SUBCUTANEOUS | 1 refills | Status: DC

## 2021-07-06 NOTE — Telephone Encounter (Addendum)
Patient Advocate Encounter   Received notification from [employer] that prior authorization for Novolog Mix 70/30 is required. (PA already started, more info is needed.  Test claim shows Humalog 75/25 is $0 copay. Spoke with Dr. Dwyane Dee and he agreed to send in new RX for this.   Spoke with pt to inform him of the change.   Armanda Magic, CPhT Patient Advocate [facility] Phone: [phone] Fax:  [phone]

## 2021-07-07 ENCOUNTER — Other Ambulatory Visit: Payer: Self-pay

## 2021-07-07 DIAGNOSIS — E118 Type 2 diabetes mellitus with unspecified complications: Secondary | ICD-10-CM

## 2021-07-07 DIAGNOSIS — E1165 Type 2 diabetes mellitus with hyperglycemia: Secondary | ICD-10-CM

## 2021-07-07 DIAGNOSIS — Z794 Long term (current) use of insulin: Secondary | ICD-10-CM

## 2021-07-07 MED ORDER — GLIMEPIRIDE 4 MG PO TABS: 4.0000 mg | ORAL_TABLET | Freq: Every day | ORAL | 0 refills | Status: DC

## 2021-07-13 ENCOUNTER — Telehealth: Payer: Self-pay | Admitting: Pharmacist

## 2021-07-13 ENCOUNTER — Telehealth: Payer: Medicare Other

## 2021-07-13 NOTE — Telephone Encounter (Signed)
  Chronic Care Management   Outreach Note  07/13/2021 Name: Charles Parker MRN: [id number] DOB: 10-Nov-1945  Referred by: Janith Lima, MD  Patient had a phone appointment scheduled with clinical pharmacist today.  An unsuccessful telephone outreach was attempted today. The patient was referred to the pharmacist for assistance with medications, care management and care coordination.   Patient will NOT be penalized in any way for missing a CCM appointment. The no-show fee does not apply.  If possible, a message was left to return call to: [phone] or to [facility]: [place], PharmD, Para March, CPP Clinical Pharmacist [facility] [phone]

## 2021-07-13 NOTE — Progress Notes (Deleted)
Chronic Care Management Pharmacy Note  07/13/2021 Name:  Charles Parker MRN:  [id number] DOB:  04/12/1946  Summary: -Pt endorses compliance with medications; however Wilder Glade has not been filled sin Jan 2022 and Novolog is overdue for refill as well, pt is likely missing doses of both -BG range 200-280, sometimes over 300 (checking ~every other day)  Recommendations/Changes made from today's visit: -Contacted [facility] to refill Novolog and Farxiga; pt agreed to pick up medications -Counseled on BID insulin dosing; advised him to take his insulin dose immediately if BG > 250 -Advised pt to contact his endocrinologist (Dr Dwyane Dee) if BG is >350 -Pt may benefit from GLP-1 agonist - will consult with endocrine   Subjective: Charles Parker is an 75 y.o. year old male who is a primary patient of Janith Lima, MD.  The CCM team was consulted for assistance with disease management and care coordination needs.    Engaged with patient by telephone for follow up visit in response to provider referral for pharmacy case management and/or care coordination services.   Consent to Services:  The patient was given information about Chronic Care Management services, agreed to services, and gave verbal consent prior to initiation of services.  Please see initial visit note for detailed documentation.   Patient Care Team: Janith Lima, MD as PCP - General (Internal Medicine) Wilford Corner, MD as Consulting Physician (Gastroenterology) Philemon Kingdom, MD as Consulting Physician (Internal Medicine) Gardiner Barefoot, DPM as Consulting Physician (Podiatry) Charlton Haws, [facility] as Pharmacist (Pharmacist) Warden Fillers, MD as Consulting Physician (Ophthalmology)   Patient lives at home alone. His wife has passed. He is working on improving blood sugars by walking, watching what he eats and taking medicines as prescribed.   Recent office visits: 02/05/21 Dr [name] OV: ordered cardiac  event monitor to evaluate fluttering. Ordered [employer].  11/07/20 Dr [name] OV: CPX, uncontrolled BG. A1c 13.5%. Rec'd against SGLT2 due to high A1c, dc'd [place]. Started glimepiride 4 mg, Freestyle Libre, [place], Vitamin D. Referred to nutrition.  Recent consult visits: 07/02/21 Dr Dwyane Dee (endocrine): inconsistent monitoring, insulin, Wilder Glade. Given log to keep record of BG (BID). Start losartan 50 mg daily.  04/27/21 Dr Dwyane Dee (endocrine): A1c 9.4. Check fasting BG 3-4 days a week. Check post-prandial BG. No med changes.  02/27/21 Levie Heritage RD (Nutrition): DM counseling.  11/13/20 Dr Dwyane Dee (endocrine): increase insulin to 40 units AM and 30 units PM. Resume Wilder Glade.  11/12/20 RD Levie Heritage (nutrition consult)  Hospital visits: Medication Reconciliation was completed by comparing discharge summary, patient's EMR and Pharmacy list, and upon discussion with patient.  Admitted to the ED on 05/09/21 due to Neck pain. Discharge date was 05/09/21. Discharged from [place] scan negative for acute abnormality  Medications that remain the same after Hospital Discharge:??  -All other medications will remain the same.    Objective:  Lab Results  Component Value Date   CREATININE 1.14 06/25/2021   BUN 13 06/25/2021   GFR 62.99 06/25/2021   GFRNONAA >60 05/09/2021   GFRAA >89 12/23/2016   NA 138 06/25/2021   K 3.9 06/25/2021   CALCIUM 9.8 06/25/2021   CO2 22 06/25/2021    Lab Results  Component Value Date/Time   HGBA1C 9.4 (H) 04/15/2021 02:41 PM   HGBA1C 9.7 (H) 02/05/2021 02:07 PM   FRUCTOSAMINE 546 (H) 06/25/2021 12:16 PM   FRUCTOSAMINE 660 (H) 10/28/2020 08:47 AM   GFR 62.99 06/25/2021 12:16 PM   GFR 74.72  04/15/2021 02:41 PM   MICROALBUR <0.7 11/07/2020 12:08 PM   MICROALBUR <0.7 02/04/2020 02:28 PM    Last diabetic Eye exam:  Lab Results  Component Value Date/Time   HMDIABEYEEXA No Retinopathy 04/10/2019 12:00 AM    Last diabetic Foot exam:  Lab  Results  Component Value Date/Time   HMDIABFOOTEX No neuropathy 07/09/2020 12:00 AM     Lab Results  Component Value Date   CHOL 153 08/19/2020   HDL 43.00 08/19/2020   LDLCALC 84 08/19/2020   LDLDIRECT 115.0 07/02/2019   TRIG 130.0 08/19/2020   CHOLHDL 4 08/19/2020    Hepatic Function Latest Ref Rng & Units 08/19/2020 05/12/2020 05/12/2020  Total Protein 6.0 - 8.3 g/dL 7.0 - 7.5  Albumin 3.5 - 5.2 g/dL 4.1 4.4 4.4  AST 0 - 37 U/L 18 - 18  ALT 0 - 53 U/L 18 - 23  Alk Phosphatase 39 - 117 U/L 58 - 61  Total Bilirubin 0.2 - 1.2 mg/dL 0.7 - 0.4    Lab Results  Component Value Date/Time   TSH 1.55 11/07/2020 12:08 PM   TSH 0.67 09/08/2016 03:13 PM    CBC Latest Ref Rng & Units 05/09/2021 02/05/2021 11/07/2020  WBC 4.0 - 10.5 K/uL 6.2 4.6 3.9(L)  Hemoglobin 13.0 - 17.0 g/dL 14.9 14.6 15.0  Hematocrit 39.0 - 52.0 % 44.9 42.4 44.3  Platelets 150 - 400 K/uL 141(L) 143.0(L) 134.0(L)    Lab Results  Component Value Date/Time   VD25OH 21.10 (L) 11/07/2020 12:08 PM   VD25OH 37.61 03/29/2019 02:48 PM    Clinical ASCVD: No  The 10-year ASCVD risk score (Arnett DK, et al., 2019) is: 37.7%   Values used to calculate the score:     Age: 18 years     Sex: Male     Is Non-Hispanic African American: Yes     Diabetic: Yes     Tobacco smoker: No     Systolic Blood Pressure: 389 mmHg     Is BP treated: Yes     HDL Cholesterol: 43 mg/dL     Total Cholesterol: 153 mg/dL    Depression screen [facility] 2/9 05/07/2021 11/12/2020 12/12/2019  Decreased Interest 0 0 0  Down, Depressed, Hopeless 0 0 0  PHQ - 2 Score 0 0 0  Altered sleeping - - -  Tired, decreased energy - - -  Change in appetite - - -  Feeling bad or failure about yourself  - - -  Trouble concentrating - - -  Moving slowly or fidgety/restless - - -  Suicidal thoughts - - -  PHQ-9 Score - - -  Difficult doing work/chores - - -  Some recent data might be hidden     Social History   Tobacco Use  Smoking Status Never  Smokeless  Tobacco Never   BP Readings from Last 3 Encounters:  07/02/21 130/90  05/09/21 (!) 185/97  05/09/21 121/85   Pulse Readings from Last 3 Encounters:  07/02/21 82  05/09/21 67  05/09/21 89   Wt Readings from Last 3 Encounters:  07/02/21 188 lb (85.3 kg)  05/07/21 195 lb 9.6 oz (88.7 kg)  04/27/21 197 lb 3.2 oz (89.4 kg)    Assessment/Interventions: Review of patient past medical history, allergies, medications, health status, including review of consultants reports, laboratory and other test data, was performed as part of comprehensive evaluation and provision of chronic care management services.   SDOH:  (Social Determinants of Health) assessments and interventions performed: Yes  CCM Care Plan  Allergies  Allergen Reactions   Metformin And Related Diarrhea   Codeine Rash    All over the body    Medications Reviewed Today     Reviewed by Cinda Quest, CMA (Certified Medical Assistant) on 07/02/21 at 59  Med List Status: <None>   Medication Order Taking? Sig Documenting Provider Last Dose Status Informant  Alcohol Swabs PADS [id number] Yes Use to clean area before insulin injections. DX E11.8 Elayne Snare, MD Taking Active   blood glucose meter kit and supplies KIT [id number] Yes 1 each by Does not apply route daily as needed. Accu-Chek Aviva preferred by patient. Use up to four times daily as directed. (FOR ICD-9 250.00, 250.01). [provider] Taking Active   Cholecalciferol 50 MCG (2000 UT) TABS [id number] Yes Take 1 tablet (2,000 Units total) by mouth daily. Janith Lima, MD Taking Active   Continuous Blood Gluc Receiver (FREESTYLE LIBRE 2 READER) [place] [id number] Yes 1 Act by Does not apply route daily. Janith Lima, MD Taking Active   Continuous Blood Gluc Sensor (FREESTYLE LIBRE 2 SENSOR) [place] [id number] Yes 1 Act by Does not apply route daily. Janith Lima, MD Taking Active   dapagliflozin propanediol (FARXIGA) 10 MG TABS tablet [id number] Yes Take 1  tablet (10 mg total) by mouth daily. Elayne Snare, MD Taking Active   gabapentin (NEURONTIN) 300 MG capsule [id number] Yes TAKE 1 CAPSULE(300 MG) BY MOUTH TWICE DAILY Elayne Snare, MD Taking Active   glimepiride (AMARYL) 4 MG tablet [id number] Yes Take 1 tablet (4 mg total) by mouth daily before breakfast. Janith Lima, MD Taking Active   Glucagon (GVOKE HYPOPEN 2-PACK) 1 MG/0.2ML Darden Palmer [id number] Yes Inject 1 Act into the skin daily as needed. Janith Lima, MD Taking Active   glucose blood test strip [id number] Yes Use Accu Chek Aviva Test Strips as instructed to check blood sugar three times daily. Elayne Snare, MD Taking Active   insulin aspart protamine - aspart (NOVOLOG MIX 70/30 FLEXPEN) (70-30) 100 UNIT/ML FlexPen [id number] Yes Inject 20 units under the skin breakfast and 26 units at dinner.  Patient taking differently: Inject 26 units under the skin breakfast and 40 units at dinner.   Janith Lima, MD Taking Active Self  Insulin Pen Needle (B-D UF III MINI PEN NEEDLES) 31G X 5 MM MISC [id number] Yes ADMINISTER 30 UNITS EVERY Sharol Given, MD Taking Active   Multiple Vitamin (MULTIVITAMIN WITH MINERALS) TABS tablet [id number] Yes Take 1 tablet by mouth daily. [provider] Taking Active   rivaroxaban (XARELTO) 10 MG TABS tablet [id number] Yes Take 1 tablet (10 mg total) by mouth daily. Janith Lima, MD Taking Active   simvastatin (ZOCOR) 40 MG tablet [id number] Yes TAKE 1 TABLET(40 MG) BY MOUTH AT BEDTIME Janith Lima, MD Taking Active             Patient Active Problem List   Diagnosis Date Noted   Thrombocytopenia ([place]) 02/05/2021   Rapid palpitations 02/05/2021   Encounter for general adult medical examination with abnormal findings 11/07/2020   Elevated factor VIII level 10/08/2019   Candidal balanitis 03/29/2019   Erectile dysfunction due to diabetes mellitus ([place]) 03/29/2019   Essential hypertension 05/18/2018   Vitamin D insufficiency 05/18/2018    Hallux rigidus, right foot 02/14/2017   Degenerative arthritis of left knee 01/11/2017   OAB (overactive bladder) 01/09/2017   Medication monitoring encounter 12/23/2016   Iron deficiency 12/16/2016   Type 2 diabetes mellitus  with complication, with long-term current use of insulin ([place]) 12/15/2016   Hyperlipidemia with target LDL less than 70 10/21/2016   Thoracic spine fracture ([place]) 10/21/2016   Benign prostatic hyperplasia (BPH) with straining on urination 06/20/2013   Tubulovillous adenoma of colon with high grade dysplasia (rectosigmoid) s/p LAR with bladder repair 02/03/12. 12/27/2011    Immunization History  Administered Date(s) Administered   Fluad Quad(high Dose 65+) 07/17/2019, 07/08/2020   Influenza, High Dose Seasonal PF 06/13/2017, 07/06/2018   Influenza,inj,Quad PF,6+ Mos 06/20/2013, 07/10/2014   Influenza-Unspecified 06/05/2015, 07/26/2016   PFIZER(Purple Top)SARS-COV-2 Vaccination 10/26/2019, 11/16/2019, 07/19/2020   PPD Test 10/29/2016   Pneumococcal Conjugate-13 06/20/2013, 07/23/2014   Pneumococcal Polysaccharide-23 06/19/2015, 02/05/2021   Tdap 06/20/2013   Zoster Recombinat (Shingrix) 07/17/2019    Conditions to be addressed/monitored:  Hypertension, Hyperlipidemia, Diabetes, Osteoarthritis and Hypercoaguable state  There are no care plans that you recently modified to display for this patient.    Compliance/Adherence/Medication fill history: Care Gaps: Eye exam (04/09/20)  Star-Rating Drugs: Simvastatin - LF 07/02/21 x 90 ds (mail) Glimepiride - LF 04/15/21 x 90 ds (WG) Farxiga - LF 06/02/21 x 90 ds (WG) Losartan - not filled  Medication Assistance: None required.  Patient affirms current coverage meets needs.  Patient's preferred pharmacy is:  [facility] [phone] - [address] [place] [address] Phone: [phone] Fax: [phone]  [facility]) - [address], [address] [place] [address] Phone: [phone] Fax: [phone]  Uses pill box? No - prefers bottles Pt endorses 90% compliance - forgets to refill meds sometimes  We discussed: Current pharmacy is preferred with insurance plan and patient is satisfied with pharmacy services Patient decided to: Continue current medication management strategy  Care Plan and Follow Up Patient Decision:  Patient agrees to Care Plan and Follow-up.  Plan: Telephone follow up appointment with care management team member scheduled for:  2 months  Charlene Brooke, PharmD, [place], CPP Clinical Pharmacist [facility] [phone]    Current Barriers:  Unable to independently monitor therapeutic efficacy Unable to achieve control of diabetes  Unable to self administer medications as prescribed  Pharmacist Clinical Goal(s):  Patient will achieve adherence to monitoring guidelines and medication adherence to achieve therapeutic efficacy achieve control of diabetes as evidenced by improvement in A1c achieve ability to self administer medications as prescribed through use of insulin as evidenced by patient report through collaboration with PharmD and provider.   Interventions: 1:1 collaboration with Janith Lima, MD regarding development and update of comprehensive plan of care as evidenced by provider attestation and co-signature Inter-disciplinary care team collaboration (see longitudinal plan of care) Comprehensive medication review performed; medication list updated in electronic medical record  Hyperlipidemia: (LDL goal < 100) -Controlled  - LDL is at goal; pt endorses compliance with statin -Current treatment: Simvastatin 40 mg daily HS -Medications previously tried: ezetimibe -Educated on Cholesterol goals; Benefits of statin for ASCVD risk reduction; -Recommended to continue current  medication  Diabetes (A1c goal <8%) -Uncontrolled - but improving. Pt reports compliance with meds, however Wilder Glade has not been filled since Jan 2022 per dispense report and Novolog is overdue if he is truly taking as prescribed -Dx 2014. Insulin since 2016. -Current home glucose readings fasting glucose: 200-280 post prandial glucose: 300s -Current medications: Glimepiride 4 mg daily (  started 2016) Farxiga 10 mg daily Humalog 75/25 - 26 units AM, 38-40 units PM Gvoke Hypopen Accu-Chek glucometer -Medications previously tried: metformin (hives), Lantus, Januvia, Invokana -Current meal patterns: seen nutritionist, trying to "lay off the sweets" -Current exercise: walking most days -Educated on A1c and blood sugar goals; Proper insulin injection technique; Benefits of routine self-monitoring of blood sugar; -Counseled extensively on adherence to medications -Pt may benefit from GLP-1 agonist -will consult with endocrine  Hypercoaguable state/Hx DVT (Goal: prevent subsequent DVT) -Controlled - pt will be on Xarelto indefinitely due to Factor VIII elevation -Current treatment  Xarelto 10 mg daily -Recommended to continue current medication  Health Maintenance -Vaccine gaps: Shingrix, Covid booster (11/19/20), Flu  Patient Goals/Self-Care Activities Patient will:  - take medications as prescribed -focus on medication adherence by establishing routine -check glucose most days, document, and provide at future appointments -target a minimum of 150 minutes of moderate intensity exercise weekly -engage in dietary modifications by following nutritionist's recommendations

## 2021-07-20 ENCOUNTER — Ambulatory Visit: Payer: Medicare Other | Admitting: Podiatry

## 2021-07-31 ENCOUNTER — Other Ambulatory Visit: Payer: Self-pay | Admitting: Internal Medicine

## 2021-07-31 DIAGNOSIS — Z794 Long term (current) use of insulin: Secondary | ICD-10-CM

## 2021-07-31 DIAGNOSIS — E118 Type 2 diabetes mellitus with unspecified complications: Secondary | ICD-10-CM

## 2021-07-31 DIAGNOSIS — E1165 Type 2 diabetes mellitus with hyperglycemia: Secondary | ICD-10-CM

## 2021-08-17 ENCOUNTER — Other Ambulatory Visit: Payer: Medicare Other

## 2021-08-17 ENCOUNTER — Other Ambulatory Visit: Payer: Self-pay | Admitting: Internal Medicine

## 2021-08-17 DIAGNOSIS — D6859 Other primary thrombophilia: Secondary | ICD-10-CM

## 2021-08-18 ENCOUNTER — Other Ambulatory Visit: Payer: Self-pay

## 2021-08-18 ENCOUNTER — Other Ambulatory Visit (INDEPENDENT_AMBULATORY_CARE_PROVIDER_SITE_OTHER): Payer: Medicare Other

## 2021-08-18 DIAGNOSIS — Z794 Long term (current) use of insulin: Secondary | ICD-10-CM | POA: Diagnosis not present

## 2021-08-18 DIAGNOSIS — E1165 Type 2 diabetes mellitus with hyperglycemia: Secondary | ICD-10-CM | POA: Diagnosis not present

## 2021-08-18 LAB — BASIC METABOLIC PANEL
BUN: 11 mg/dL (ref 6–23)
CO2: 28 mEq/L (ref 19–32)
Calcium: 9.6 mg/dL (ref 8.4–10.5)
Chloride: 104 mEq/L (ref 96–112)
Creatinine, Ser: 1.03 mg/dL (ref 0.40–1.50)
GFR: 71.08 mL/min (ref >60.00)
Glucose, Bld: 172 mg/dL — ABNORMAL HIGH (ref 70–99)
Potassium: 4.2 mEq/L (ref 3.5–5.1)
Sodium: 139 mEq/L (ref 135–145)

## 2021-08-18 LAB — HEMOGLOBIN A1C: Hgb A1c MFr Bld: 11.2 % — ABNORMAL HIGH (ref 4.6–6.5)

## 2021-08-20 ENCOUNTER — Ambulatory Visit: Payer: Medicare Other | Admitting: Endocrinology

## 2021-08-22 ENCOUNTER — Other Ambulatory Visit: Payer: Self-pay | Admitting: Internal Medicine

## 2021-08-22 DIAGNOSIS — E1165 Type 2 diabetes mellitus with hyperglycemia: Secondary | ICD-10-CM

## 2021-08-22 DIAGNOSIS — Z794 Long term (current) use of insulin: Secondary | ICD-10-CM

## 2021-08-22 DIAGNOSIS — E118 Type 2 diabetes mellitus with unspecified complications: Secondary | ICD-10-CM

## 2021-08-31 ENCOUNTER — Other Ambulatory Visit: Payer: Self-pay | Admitting: Internal Medicine

## 2021-08-31 DIAGNOSIS — E785 Hyperlipidemia, unspecified: Secondary | ICD-10-CM

## 2021-08-31 DIAGNOSIS — D6859 Other primary thrombophilia: Secondary | ICD-10-CM

## 2021-09-21 ENCOUNTER — Other Ambulatory Visit: Payer: Self-pay

## 2021-09-21 DIAGNOSIS — Z794 Long term (current) use of insulin: Secondary | ICD-10-CM

## 2021-09-21 DIAGNOSIS — E1165 Type 2 diabetes mellitus with hyperglycemia: Secondary | ICD-10-CM

## 2021-09-21 MED ORDER — DAPAGLIFLOZIN PROPANEDIOL 10 MG PO TABS: 10.0000 mg | ORAL_TABLET | Freq: Every day | ORAL | 1 refills | Status: DC

## 2021-10-09 ENCOUNTER — Other Ambulatory Visit (INDEPENDENT_AMBULATORY_CARE_PROVIDER_SITE_OTHER): Payer: Medicaid Other

## 2021-10-09 ENCOUNTER — Other Ambulatory Visit: Payer: Self-pay

## 2021-10-09 ENCOUNTER — Other Ambulatory Visit: Payer: Self-pay | Admitting: Endocrinology

## 2021-10-09 DIAGNOSIS — E1165 Type 2 diabetes mellitus with hyperglycemia: Secondary | ICD-10-CM

## 2021-10-09 DIAGNOSIS — E7800 Pure hypercholesterolemia, unspecified: Secondary | ICD-10-CM

## 2021-10-09 DIAGNOSIS — Z794 Long term (current) use of insulin: Secondary | ICD-10-CM

## 2021-10-09 LAB — LIPID PANEL
Cholesterol: 194 mg/dL (ref 0–200)
HDL: 52.1 mg/dL (ref >39.00)
LDL Cholesterol: 111 mg/dL — ABNORMAL HIGH (ref 0–99)
NonHDL: 141.91
Total CHOL/HDL Ratio: 4
Triglycerides: 153 mg/dL — ABNORMAL HIGH (ref 0.0–149.0)
VLDL: 30.6 mg/dL (ref 0.0–40.0)

## 2021-10-09 LAB — BASIC METABOLIC PANEL
BUN: 16 mg/dL (ref 6–23)
CO2: 23 mEq/L (ref 19–32)
Calcium: 9.4 mg/dL (ref 8.4–10.5)
Chloride: 104 mEq/L (ref 96–112)
Creatinine, Ser: 1.08 mg/dL (ref 0.40–1.50)
GFR: 67.08 mL/min (ref >60.00)
Glucose, Bld: 227 mg/dL — ABNORMAL HIGH (ref 70–99)
Potassium: 3.8 mEq/L (ref 3.5–5.1)
Sodium: 139 mEq/L (ref 135–145)

## 2021-10-10 LAB — FRUCTOSAMINE: Fructosamine: 417 umol/L — ABNORMAL HIGH (ref 0–285)

## 2021-10-12 ENCOUNTER — Encounter: Payer: Self-pay | Admitting: Endocrinology

## 2021-10-12 ENCOUNTER — Ambulatory Visit (INDEPENDENT_AMBULATORY_CARE_PROVIDER_SITE_OTHER): Payer: Commercial Managed Care - HMO | Admitting: Endocrinology

## 2021-10-12 ENCOUNTER — Other Ambulatory Visit: Payer: Self-pay

## 2021-10-12 VITALS — BP 126/84 | HR 66 | Ht 71.5 in | Wt 190.0 lb

## 2021-10-12 DIAGNOSIS — E1165 Type 2 diabetes mellitus with hyperglycemia: Secondary | ICD-10-CM | POA: Diagnosis not present

## 2021-10-12 DIAGNOSIS — Z794 Long term (current) use of insulin: Secondary | ICD-10-CM

## 2021-10-12 DIAGNOSIS — I10 Essential (primary) hypertension: Secondary | ICD-10-CM

## 2021-10-12 DIAGNOSIS — E7800 Pure hypercholesterolemia, unspecified: Secondary | ICD-10-CM | POA: Diagnosis not present

## 2021-10-12 LAB — POCT GLUCOSE (DEVICE FOR HOME USE): POC Glucose: 262 mg/dl — AB (ref 70–99)

## 2021-10-12 MED ORDER — LOSARTAN POTASSIUM 25 MG PO TABS: 25.0000 mg | ORAL_TABLET | Freq: Every day | ORAL | 1 refills | Status: AC

## 2021-10-12 NOTE — Patient Instructions (Addendum)
Take 36 units both am and at dinner  Check blood sugars on waking up 2 days a week  Also check blood sugars about 2 hours after meals and do this after different meals by rotation  Recommended blood sugar levels on waking up are 90-130 and about 2 hours after meal is 130-160  Please bring your blood sugar monitor to each visit, thank you  Simvastatin daily

## 2021-10-12 NOTE — Progress Notes (Signed)
Patient ID: Charles Parker, male   DOB: Feb 14, 1946, 76 y.o.   MRN: [id number]          Reason for Appointment: Follow-up for Type 2 Diabetes  Referring physician: Scarlette Calico   History of Present Illness:          Date of diagnosis of type 2 diabetes mellitus: 2014        Background history:   He has been on various oral hypoglycemic drugs since the onset including metformin, Januvia and Amaryl Was also on Invokana in 2015 for about a year but not clear why this was stopped His level of control has been quite variable with highest A1c 13.6 in 06/2015  He has been on insulin since about 10/2014 as documented in his record  Recent history:   INSULIN regimen is:  NovoLog mix 26 units before breakfast and 36 units before dinner  Non-insulin hypoglycemic drugs the patient is taking WJX:BJYNWGN 10 mg daily  His A1c is last 11.2  Fructosamine is high at 417 although previously higher   Current management, blood sugar patterns and problems identified: He is likely not taking his insulin doses consistently including this morning  Although he thinks he is taking his insulin regularly does admit that periodically he will forget  Also he was supposed to be taking 40 units of insulin in the morning previously and now taking only 26 He says that he is preoccupied with family issues and other problems and does not remember his insulin injections consistently Also unable to check his blood sugars much  He says he did not get the time to check his sugars and has more readings since October  Likely not planning his meals well Weight is about the same Does not do any exercise However he thinks he is taking his [place] regularly  Side effects from medications have been: Hives from metformin  Compliance with the medical regimen: Inconsistent  Glucose monitoring:  done< 1 times a   day usually        Glucometer: Accu-Chek guide       Blood Glucose readings none recently    Prior:  Fasting range 123-185, median 151    Self-care: The diet that the patient has been following is: tries to limit [employer] .      Typical meal intake: Breakfast is eggs/cereal or oatmeal usually, lunch:  Lean cuisine or sandwiches.  Dinner chicken and vegetables.   Snacks will be chips, peanut butter crackers Supper at 6 pm                Dietician visit, most recent:3/19               Weight history:  Wt Readings from Last 3 Encounters:  10/12/21 190 lb (86.2 kg)  07/02/21 188 lb (85.3 kg)  05/07/21 195 lb 9.6 oz (88.7 kg)    Glycemic control:   Lab Results  Component Value Date   HGBA1C 11.2 Repeated and verified X2. (H) 08/18/2021   HGBA1C 9.4 (H) 04/15/2021   HGBA1C 9.7 (H) 02/05/2021   Lab Results  Component Value Date   MICROALBUR <0.7 11/07/2020   LDLCALC 111 (H) 10/09/2021   CREATININE 1.08 10/09/2021   Lab Results  Component Value Date   MICRALBCREAT 1.4 11/07/2020    Lab Results  Component Value Date   FRUCTOSAMINE 417 (H) 10/09/2021   FRUCTOSAMINE 546 (H) 06/25/2021   FRUCTOSAMINE 660 (H) 10/28/2020      Allergies as  of 10/12/2021       Reactions   Metformin And Related Diarrhea   Codeine Rash   All over the body        Medication List        Accurate as of October 12, 2021  8:32 PM. If you have any questions, ask your nurse or doctor.          Alcohol Swabs Pads Use to clean area before insulin injections. DX E11.8   B-D UF III MINI PEN NEEDLES 31G X 5 MM Misc Generic drug: Insulin Pen Needle ADMINISTER 30 UNITS EVERY EVENING   blood glucose meter kit and supplies Kit 1 each by Does not apply route daily as needed. Accu-Chek Aviva preferred by patient. Use up to four times daily as directed. (FOR ICD-9 250.00, 250.01).   Cholecalciferol 50 MCG (2000 UT) Tabs Take 1 tablet (2,000 Units total) by mouth daily.   dapagliflozin propanediol 10 MG Tabs tablet Commonly known as: Farxiga Take 1 tablet (10 mg total) by  mouth daily.   FreeStyle Libre 2 Reader [place] 1 Act by Does not apply route daily.   FreeStyle Libre 2 Sensor Misc 1 Act by Does not apply route daily.   gabapentin 300 MG capsule Commonly known as: NEURONTIN TAKE 1 CAPSULE(300 MG) BY MOUTH TWICE DAILY   glimepiride 4 MG tablet Commonly known as: AMARYL TAKE 1 TABLET BY MOUTH  DAILY BEFORE BREAKFAST   glucose blood test strip Use Accu Chek Aviva Test Strips as instructed to check blood sugar three times daily.   Gvoke HypoPen 2-Pack 1 MG/0.2ML Soaj Generic drug: Glucagon Inject 1 Act into the skin daily as needed.   Insulin Lispro Prot & Lispro (75-25) 100 UNIT/ML Kwikpen Commonly known as: HumaLOG Mix 75/25 KwikPen 40 U acb and 36 U acs What changed: additional instructions   losartan 50 MG tablet Commonly known as: COZAAR Take 1 tablet (50 mg total) by mouth daily.   multivitamin with minerals Tabs tablet Take 1 tablet by mouth daily.   simvastatin 40 MG tablet Commonly known as: ZOCOR TAKE 1 TABLET(40 MG) BY MOUTH AT BEDTIME   Xarelto 10 MG Tabs tablet Generic drug: rivaroxaban TAKE 1 TABLET(10 MG) BY MOUTH DAILY        Allergies:  Allergies  Allergen Reactions   Metformin And Related Diarrhea   Codeine Rash    All over the body    Past Medical History:  Diagnosis Date   Clotting disorder ([place])    DVT   Diabetes mellitus    type II   Diverticulosis    DVT (deep venous thrombosis) (HCC)    Glaucoma    no eye drops    Hepatic cyst    Hypercholesterolemia    Hypertension    Renal cyst    Sleep apnea    Tubulovillous adenoma     Past Surgical History:  Procedure Laterality Date   BLADDER NECK RECONSTRUCTION  02/03/2012   Procedure: BLADDER NECK REPAIR;  Surgeon: Adin Hector, MD;  Location: [facility];  Service: General;;   COLONOSCOPY  02/2013   polyp removal(mult.)   KNEE SURGERY  2004   left   POLYPECTOMY     PROCTOSCOPY  02/03/2012   Procedure: PROCTOSCOPY;  Surgeon: Adin Hector, MD;   Location: [facility];  Service: General;;   STOMACH SURGERY  02/2012   TEE WITHOUT CARDIOVERSION N/A 10/05/2016   Procedure: TRANSESOPHAGEAL ECHOCARDIOGRAM (TEE);  Surgeon: Sanda Klein, MD;  Location: [name] ENDOSCOPY;  Service: Cardiovascular;  Laterality: N/A;    Family History  Problem Relation Age of Onset   Hypertension Mother    Diabetes Mother    Cancer Mother        ? location   Colon cancer Father 27   Colon polyps Neg Hx    Liver cancer Neg Hx     Social History:  reports that he has never smoked. He has never used smokeless tobacco. He reports that he does not drink alcohol and does not use drugs.   Review of Systems   Lipid history: Has been taking simvastatin 40 mg with variable control, followed by PCP He thinks he may occasionally forget his medication Also recently has not been planning his meals well and going off diet with higher LDL   Lab Results  Component Value Date   CHOL 194 10/09/2021   CHOL 153 08/19/2020   CHOL 202 (H) 05/12/2020   Lab Results  Component Value Date   HDL 52.10 10/09/2021   HDL 43.00 08/19/2020   HDL 50.80 05/12/2020   Lab Results  Component Value Date   LDLCALC 111 (H) 10/09/2021   LDLCALC 84 08/19/2020   LDLCALC 124 (H) 05/12/2020   Lab Results  Component Value Date   TRIG 153.0 (H) 10/09/2021   TRIG 130.0 08/19/2020   TRIG 134.0 05/12/2020   Lab Results  Component Value Date   CHOLHDL 4 10/09/2021   CHOLHDL 4 08/19/2020   CHOLHDL 4 05/12/2020   Lab Results  Component Value Date   LDLDIRECT 115.0 07/02/2019   LDLDIRECT 118.0 11/11/2015   LDLDIRECT 201.0 03/04/2015           He has history of neuropathy taking gabapentin prescribed by PCP  Most recent foot exam: 2/22  RENAL function is consistently normal with continuing Farxiga 10 mg  Lab Results  Component Value Date   CREATININE 1.08 10/09/2021   CREATININE 1.03 08/18/2021   CREATININE 1.14 06/25/2021    Blood pressure : He appears to have relatively  high blood pressure readings fairly consistently Although he was started on losartan in September he has not refilled the prescriptions for couple of months at least  BP Readings from Last 3 Encounters:  10/12/21 126/84  07/02/21 130/90  05/09/21 (!) 185/97    Has had all 3 Covid shots  May have diabetic shoes for his foot deformities    Physical Examination:  BP 126/84    Pulse 66    Ht 5' 11.5" (1.816 m)    Wt 190 lb (86.2 kg)    SpO2 99%    BMI 26.13 kg/m       ASSESSMENT:  Diabetes type 2 on insulin  See history of present illness for detailed discussion of current diabetes management, blood sugar patterns and problems identified   He is on a regimen of premixed insulin twice a day and Farxiga  His A1c is last significantly high at 11.2 when checked by PCP Fructosamine is significantly high at 417  He has persistently poor control  He has consistent issues with compliance with his insulin, monitoring and diet Also appears to be arbitrarily changing his insulin dose and not taking much less insulin in the morning than before Not able to assess his insulin requirement because of lack of monitoring Today he did not take his insulin this morning and blood sugar is 262 He does take [place] regularly  Unlikely even if he has a freestyle libre he will check his blood sugars  consistently with this  He has been given a log sheet to keep a record his insulin and blood sugars and he still has not done it  HYPERTENSION associated with diabetes: Currently not on treatment and blood pressure is high normal   PLAN:    He was advised about his blood sugar control level Discussed likelihood of getting worsening diabetes complications if he does not start focusing on his health and diabetes He needs to check his blood sugars regularly Also needs to make sure he is taking his insulin doses before breakfast and dinner For simplicity he can take 36 units twice a day of his  insulin doses Take Farxiga every morning All necessary prescriptions were refilled today  For his lipids advised him that his cholesterol is out of control and he needs to take his Zocor regularly as well as cut back on fast food and high fat foods  Restart LOSARTAN but can go with 25 mg for now since blood pressure is not significantly high today   More regular follow-up  Total visit time including counseling = 30 minutes     Patient Instructions  Take 36 units both am and at dinner  Check blood sugars on waking up 2 days a week  Also check blood sugars about 2 hours after meals and do this after different meals by rotation  Recommended blood sugar levels on waking up are 90-130 and about 2 hours after meal is 130-160  Please bring your blood sugar monitor to each visit, thank you  Simvastatin daily       Elayne Snare 10/12/2021, 8:32 PM   Note: This office note was prepared with Dragon voice recognition system technology. Any transcriptional errors that result from this process are unintentional.

## 2021-10-16 ENCOUNTER — Telehealth: Payer: Self-pay

## 2021-10-16 NOTE — Progress Notes (Signed)
° ° °Chronic Care Management °Pharmacy Assistant  ° °Name: Charles Parker  MRN: [id number] DOB: 04/28/1946 ° ° °Reason for Encounter: Disease State ° Appt: 11/30/21 @ 9:45 am with Dan ° °Conditions to be addressed/monitored: °DMII ° °Recent office visits:  °None ID ° °Recent consult visits:  °10/12/21 Kumar, Ajay, MD-Endocrinologist (2 diabetes mellitus)  take 36 units twice a day of his insulin doses ° °Hospital visits:  °None since last coordination call ° °Medications: °Outpatient Encounter Medications as of 10/16/2021  °Medication Sig  ° Alcohol Swabs PADS Use to clean area before insulin injections. DX E11.8  ° blood glucose meter kit and supplies KIT 1 each by Does not apply route daily as needed. Accu-Chek Aviva preferred by patient. Use up to four times daily as directed. (FOR ICD-9 250.00, 250.01).  ° Cholecalciferol 50 MCG (2000 UT) TABS Take 1 tablet (2,000 Units total) by mouth daily.  ° Continuous Blood Gluc Receiver (FREESTYLE LIBRE 2 READER) DEVI 1 Act by Does not apply route daily. (Patient not taking: Reported on 10/12/2021)  ° Continuous Blood Gluc Sensor (FREESTYLE LIBRE 2 SENSOR) MISC 1 Act by Does not apply route daily. (Patient not taking: Reported on 10/12/2021)  ° dapagliflozin propanediol (FARXIGA) 10 MG TABS tablet Take 1 tablet (10 mg total) by mouth daily.  ° gabapentin (NEURONTIN) 300 MG capsule TAKE 1 CAPSULE(300 MG) BY MOUTH TWICE DAILY  ° glimepiride (AMARYL) 4 MG tablet TAKE 1 TABLET BY MOUTH  DAILY BEFORE BREAKFAST  ° Glucagon (GVOKE HYPOPEN 2-PACK) 1 MG/0.2ML SOAJ Inject 1 Act into the skin daily as needed.  ° glucose blood test strip Use Accu Chek Aviva Test Strips as instructed to check blood sugar three times daily.  ° Insulin Lispro Prot & Lispro (HUMALOG MIX 75/25 KWIKPEN) (75-25) 100 UNIT/ML Kwikpen 40 U acb and 36 U acs (Patient taking differently: 26 units am and 36 units pm)  ° Insulin Pen Needle (B-D UF III MINI PEN NEEDLES) 31G X 5 MM MISC ADMINISTER 30 UNITS EVERY EVENING  °  losartan (COZAAR) 25 MG tablet Take 1 tablet (25 mg total) by mouth daily.  ° Multiple Vitamin (MULTIVITAMIN WITH MINERALS) TABS tablet Take 1 tablet by mouth daily. (Patient not taking: Reported on 10/12/2021)  ° simvastatin (ZOCOR) 40 MG tablet TAKE 1 TABLET(40 MG) BY MOUTH AT BEDTIME  ° XARELTO 10 MG TABS tablet TAKE 1 TABLET(10 MG) BY MOUTH DAILY  ° °No facility-administered encounter medications on file as of 10/16/2021.  ° °Recent Relevant Labs: °Lab Results  °Component Value Date/Time  ° HGBA1C 11.2 Repeated and verified X2. (H) 08/18/2021 01:27 PM  ° HGBA1C 9.4 (H) 04/15/2021 02:41 PM  ° MICROALBUR <0.7 11/07/2020 12:08 PM  ° MICROALBUR <0.7 02/04/2020 02:28 PM  °  °Kidney Function °Lab Results  °Component Value Date/Time  ° CREATININE 1.08 10/09/2021 02:00 PM  ° CREATININE 1.03 08/18/2021 01:27 PM  ° CREATININE 1.04 03/24/2017 10:49 AM  ° CREATININE 0.88 12/23/2016 09:22 AM  ° GFR 67.08 10/09/2021 02:00 PM  ° GFRNONAA >60 05/09/2021 01:41 PM  ° GFRNONAA 87 12/23/2016 09:22 AM  ° GFRAA >89 12/23/2016 09:22 AM  ° ° °Current antihyperglycemic regimen:  °Farxiga 10 mg daily °Humalog  °Glimepiride 4 mg ° °What recent interventions/DTPs have been made to improve glycemic control:  ° check his blood sugars regularly, For simplicity he can take 36 units twice a day of his insulin doses, Take Farxiga every morning, per Dr Kumar 10/12/21 ° °Have there been any recent hospitalizations   hospitalizations or ED visits since last visit with CPP? No, not since last coordination call  Patient denies hypoglycemic symptoms, including None  Patient denies hyperglycemic symptoms, including none  How often are you checking your blood sugar? twice daily  What are your blood sugars ranging? 160-200 Fasting: 170 During the week, how often does your blood glucose drop below 70?  Patient states that he has not noticed any low readings  Are you checking your feet daily/regularly? Patient states that he does not have any swelling, redness or open  sore on his feet  Adherence Review: Is the patient currently on a STATIN medication? Yes Is the patient currently on ACE/ARB medication? Yes Does the patient have >5 day gap between last estimated fill dates? No   Care Gaps: Colonoscopy-11/23/18 Diabetic Foot Exam-07/09/20 Ophthalmology-NA Dexa Scan - Na Annual Well Visit - NA Micro albumin-11/07/20 Hemoglobin A1c- 08/18/21  Star Rating Drugs: Losartan 25 mg-last fill 10/12/21 90 ds Farxiga 10 mg-last fill 08/04/21 90 ds Simvastatin 40 mg-last fill 09/07/21 90 ds Glimepiride 4 mg-last fill 09/17/21 90 ds  Ethelene Hal Clinical Pharmacist Assistant [phone]

## 2021-11-01 ENCOUNTER — Other Ambulatory Visit: Payer: Self-pay | Admitting: Internal Medicine

## 2021-11-01 DIAGNOSIS — D6859 Other primary thrombophilia: Secondary | ICD-10-CM

## 2021-11-01 DIAGNOSIS — E785 Hyperlipidemia, unspecified: Secondary | ICD-10-CM

## 2021-11-05 ENCOUNTER — Other Ambulatory Visit: Payer: Self-pay | Admitting: Internal Medicine

## 2021-11-05 ENCOUNTER — Other Ambulatory Visit: Payer: Self-pay

## 2021-11-05 ENCOUNTER — Ambulatory Visit (INDEPENDENT_AMBULATORY_CARE_PROVIDER_SITE_OTHER): Payer: Medicare HMO | Admitting: Internal Medicine

## 2021-11-05 ENCOUNTER — Encounter: Payer: Self-pay | Admitting: Internal Medicine

## 2021-11-05 VITALS — BP 116/80 | HR 70 | Temp 98.1°F | Ht 71.5 in | Wt 189.0 lb

## 2021-11-05 DIAGNOSIS — E118 Type 2 diabetes mellitus with unspecified complications: Secondary | ICD-10-CM | POA: Diagnosis not present

## 2021-11-05 DIAGNOSIS — R3589 Other polyuria: Secondary | ICD-10-CM

## 2021-11-05 DIAGNOSIS — Z794 Long term (current) use of insulin: Secondary | ICD-10-CM

## 2021-11-05 DIAGNOSIS — R32 Unspecified urinary incontinence: Secondary | ICD-10-CM | POA: Diagnosis not present

## 2021-11-05 DIAGNOSIS — I10 Essential (primary) hypertension: Secondary | ICD-10-CM

## 2021-11-05 LAB — HEMOGLOBIN A1C: Hgb A1c MFr Bld: 10.3 % — ABNORMAL HIGH (ref 4.6–6.5)

## 2021-11-05 LAB — URINALYSIS, ROUTINE W REFLEX MICROSCOPIC
Bilirubin Urine: NEGATIVE
Hgb urine dipstick: NEGATIVE
Ketones, ur: NEGATIVE
Leukocytes,Ua: NEGATIVE
Nitrite: NEGATIVE
RBC / HPF: NONE SEEN (ref 0–?)
Specific Gravity, Urine: 1.01 (ref 1.000–1.030)
Total Protein, Urine: NEGATIVE
Urine Glucose: >=1000 — AB
Urobilinogen, UA: 0.2 (ref 0.0–1.0)
pH: 5.5 (ref 5.0–8.0)

## 2021-11-05 LAB — CBC WITH DIFFERENTIAL/PLATELET
Basophils Absolute: 0 10*3/uL (ref 0.0–0.1)
Basophils Relative: 0.6 % (ref 0.0–3.0)
Eosinophils Absolute: 0 10*3/uL (ref 0.0–0.7)
Eosinophils Relative: 0.5 % (ref 0.0–5.0)
HCT: 48.5 % (ref 39.0–52.0)
Hemoglobin: 16.1 g/dL (ref 13.0–17.0)
Lymphocytes Relative: 38.8 % (ref 12.0–46.0)
Lymphs Abs: 1.8 10*3/uL (ref 0.7–4.0)
MCHC: 33.2 g/dL (ref 30.0–36.0)
MCV: 92.9 fl (ref 78.0–100.0)
Monocytes Absolute: 0.5 10*3/uL (ref 0.1–1.0)
Monocytes Relative: 9.8 % (ref 3.0–12.0)
Neutro Abs: 2.3 10*3/uL (ref 1.4–7.7)
Neutrophils Relative %: 50.3 % (ref 43.0–77.0)
Platelets: 166 10*3/uL (ref 150.0–400.0)
RBC: 5.22 Mil/uL (ref 4.22–5.81)
RDW: 13.1 % (ref 11.5–15.5)
WBC: 4.6 10*3/uL (ref 4.0–10.5)

## 2021-11-05 LAB — HEPATIC FUNCTION PANEL
ALT: 24 U/L (ref 0–53)
AST: 22 U/L (ref 0–37)
Albumin: 4.8 g/dL (ref 3.5–5.2)
Alkaline Phosphatase: 66 U/L (ref 39–117)
Bilirubin, Direct: 0.1 mg/dL (ref 0.0–0.3)
Total Bilirubin: 0.6 mg/dL (ref 0.2–1.2)
Total Protein: 8.1 g/dL (ref 6.0–8.3)

## 2021-11-05 LAB — BASIC METABOLIC PANEL
BUN: 18 mg/dL (ref 6–23)
CO2: 28 mEq/L (ref 19–32)
Calcium: 10 mg/dL (ref 8.4–10.5)
Chloride: 102 mEq/L (ref 96–112)
Creatinine, Ser: 1.17 mg/dL (ref 0.40–1.50)
GFR: 60.9 mL/min (ref >60.00)
Glucose, Bld: 257 mg/dL — ABNORMAL HIGH (ref 70–99)
Potassium: 4.4 mEq/L (ref 3.5–5.1)
Sodium: 138 mEq/L (ref 135–145)

## 2021-11-05 MED ORDER — PIOGLITAZONE HCL 45 MG PO TABS: 45.0000 mg | ORAL_TABLET | Freq: Every day | ORAL | 3 refills | Status: DC

## 2021-11-05 NOTE — Assessment & Plan Note (Addendum)
Also For UA Culture r/o infection, but I suspect most likely polyuria due to uncontrolled DM; but also consider trial flomax or urology referral to r/o overflow incontinence secondary to prostate

## 2021-11-05 NOTE — Progress Notes (Signed)
Patient ID: Charles Parker, male   DOB: Feb 26, 1946, 76 y.o.   MRN: [id number]        Chief Complaint: follow up polyuria with urinary incontinence       HPI:  Charles Parker is a 76 y.o. male here with above for at least 2 wks with frequency and urgency,  Denies urinary symptoms such as dysuria, urgency, flank pain, hematuria or n/v, fever, chills.  Has hx of uncontrolled DM, and also hx of symptomatic DM with polyuria in the past with elevated sugar, specifically I reviewed A1c done over the past 2 yrs and he confims better and worse poluria associated with uncontrolled, then controlled and the recently more uncontrolled agani recenlty with worse then better then worse a1c's.  Reason not clear per pt - states good compliance with meds and diet, not sure about activity levels consistent.  Pt denies chest pain, increased sob or doe, wheezing, orthopnea, PND, increased LE swelling, palpitations, dizziness or syncope.   Pt denies fever, wt loss, night sweats, loss of appetite, or other constitutional symptoms         Wt Readings from Last 3 Encounters:  11/05/21 189 lb (85.7 kg)  10/12/21 190 lb (86.2 kg)  07/02/21 188 lb (85.3 kg)   BP Readings from Last 3 Encounters:  11/05/21 116/80  10/12/21 126/84  07/02/21 130/90         Past Medical History:  Diagnosis Date   Clotting disorder ([place])    DVT   Diabetes mellitus    type II   Diverticulosis    DVT (deep venous thrombosis) ([place])    Glaucoma    no eye drops    Hepatic cyst    Hypercholesterolemia    Hypertension    Renal cyst    Sleep apnea    Tubulovillous adenoma    Past Surgical History:  Procedure Laterality Date   BLADDER NECK RECONSTRUCTION  02/03/2012   Procedure: BLADDER NECK REPAIR;  Surgeon: Adin Hector, MD;  Location: [facility];  Service: General;;   COLONOSCOPY  02/2013   polyp removal(mult.)   KNEE SURGERY  2004   left   POLYPECTOMY     PROCTOSCOPY  02/03/2012   Procedure: PROCTOSCOPY;  Surgeon: Adin Hector,  MD;  Location: [facility];  Service: General;;   STOMACH SURGERY  02/2012   TEE WITHOUT CARDIOVERSION N/A 10/05/2016   Procedure: TRANSESOPHAGEAL ECHOCARDIOGRAM (TEE);  Surgeon: Sanda Klein, MD;  Location: [facility] ENDOSCOPY;  Service: Cardiovascular;  Laterality: N/A;    reports that he has never smoked. He has never used smokeless tobacco. He reports that he does not drink alcohol and does not use drugs. family history includes Cancer in his mother; Colon cancer (age of onset: 64) in his father; Diabetes in his mother; Hypertension in his mother. Allergies  Allergen Reactions   Metformin And Related Diarrhea   Codeine Rash    All over the body   Current Outpatient Medications on File Prior to Visit  Medication Sig Dispense Refill   Alcohol Swabs PADS Use to clean area before insulin injections. DX E11.8 200 each 3   blood glucose meter kit and supplies KIT 1 each by Does not apply route daily as needed. Accu-Chek Aviva preferred by patient. Use up to four times daily as directed. (FOR ICD-9 250.00, 250.01).     Cholecalciferol 50 MCG (2000 UT) TABS Take 1 tablet (2,000 Units total) by mouth daily. 90 tablet 3   Continuous Blood Gluc Receiver (  FREESTYLE LIBRE 2 READER) DEVI 1 Act by Does not apply route daily. 2 each 5   dapagliflozin propanediol (FARXIGA) 10 MG TABS tablet Take 1 tablet (10 mg total) by mouth daily. 90 tablet 1   gabapentin (NEURONTIN) 300 MG capsule TAKE 1 CAPSULE(300 MG) BY MOUTH TWICE DAILY 180 capsule 1   glimepiride (AMARYL) 4 MG tablet TAKE 1 TABLET BY MOUTH  DAILY BEFORE BREAKFAST 90 tablet 0   Glucagon (GVOKE HYPOPEN 2-PACK) 1 MG/0.2ML SOAJ Inject 1 Act into the skin daily as needed. 2 mL 5   glucose blood test strip Use Accu Chek Aviva Test Strips as instructed to check blood sugar three times daily. 100 each 3   Insulin Lispro Prot & Lispro (HUMALOG MIX 75/25 KWIKPEN) (75-25) 100 UNIT/ML Kwikpen 40 U acb and 36 U acs (Patient taking differently: 26 units am and 36 units pm)  15 mL 1   Insulin Pen Needle (B-D UF III MINI PEN NEEDLES) 31G X 5 MM MISC ADMINISTER 30 UNITS EVERY EVENING 100 each 2   losartan (COZAAR) 25 MG tablet Take 1 tablet (25 mg total) by mouth daily. 90 tablet 1   simvastatin (ZOCOR) 40 MG tablet TAKE 1 TABLET BY MOUTH AT  BEDTIME 90 tablet 0   XARELTO 10 MG TABS tablet TAKE 1 TABLET BY MOUTH DAILY 90 tablet 0   Continuous Blood Gluc Sensor (FREESTYLE LIBRE 2 SENSOR) MISC 1 Act by Does not apply route daily. (Patient not taking: Reported on 10/12/2021) 2 each 5   Multiple Vitamin (MULTIVITAMIN WITH MINERALS) TABS tablet Take 1 tablet by mouth daily. (Patient not taking: Reported on 10/12/2021)     No current facility-administered medications on file prior to visit.        ROS:  All others reviewed and negative.  Objective        PE:  BP 116/80 (BP Location: Left Arm, Patient Position: Sitting, Cuff Size: Large)    Pulse 70    Temp 98.1 F (36.7 C) (Oral)    Ht 5' 11.5" (1.816 m)    Wt 189 lb (85.7 kg)    SpO2 96%    BMI 25.99 kg/m                 Constitutional: Pt appears in NAD               HENT: Head: NCAT.                Right Ear: External ear normal.                 Left Ear: External ear normal.                Eyes: . Pupils are equal, round, and reactive to light. Conjunctivae and EOM are normal               Nose: without d/c or deformity               Neck: Neck supple. Gross normal ROM               Cardiovascular: Normal rate and regular rhythm.                 Pulmonary/Chest: Effort normal and breath sounds without rales or wheezing.                Abd:  Soft, NT, ND, + BS, no organomegaly  Neurological: Pt is alert. At baseline orientation, motor grossly intact               Skin: Skin is warm. No rashes, no other new lesions, LE edema - none               Psychiatric: Pt behavior is normal without agitation   Micro: none  Cardiac tracings I have personally interpreted today:  none  Pertinent Radiological  findings (summarize): none   Lab Results  Component Value Date   WBC 4.6 11/05/2021   HGB 16.1 11/05/2021   HCT 48.5 11/05/2021   PLT 166.0 11/05/2021   GLUCOSE 257 (H) 11/05/2021   CHOL 194 10/09/2021   TRIG 153.0 (H) 10/09/2021   HDL 52.10 10/09/2021   LDLDIRECT 115.0 07/02/2019   LDLCALC 111 (H) 10/09/2021   ALT 24 11/05/2021   AST 22 11/05/2021   NA 138 11/05/2021   K 4.4 11/05/2021   CL 102 11/05/2021   CREATININE 1.17 11/05/2021   BUN 18 11/05/2021   CO2 28 11/05/2021   TSH 1.55 11/07/2020   PSA 1.82 11/07/2020   INR 1.37 09/26/2016   HGBA1C 10.3 (H) 11/05/2021   MICROALBUR <0.7 11/07/2020   Assessment/Plan:  SELIG WAMPOLE is a 76 y.o. Black or African American [2] male with  has a past medical history of Clotting disorder ([place]), Diabetes mellitus, Diverticulosis, DVT (deep venous thrombosis) ([place]), Glaucoma, Hepatic cyst, Hypercholesterolemia, Hypertension, Renal cyst, Sleep apnea, and Tubulovillous adenoma.  Polyuria I suspect most likely due to uncontrolled DM with some element of non compliance, also to consider eval for diabetes insipidus if current eval not helpful  Essential hypertension BP Readings from Last 3 Encounters:  11/05/21 116/80  10/12/21 126/84  07/02/21 130/90   Stable, pt to continue medical treatment losartan   Type 2 diabetes mellitus with complication, with long-term current use of insulin (HCC) Also for a1c with labs, pt admits to some noncompliance with meds including insulin  Urinary incontinence Also For UA Culture r/o infection, but I suspect most likely polyuria due to uncontrolled DM; but also consider trial flomax or urology referral to r/o overflow incontinence secondary to prostate  Followup: Return if symptoms worsen or fail to improve.  Cathlean Cower, MD 11/08/2021 4:43 AM [facility]

## 2021-11-05 NOTE — Assessment & Plan Note (Signed)
Also for a1c with labs, pt admits to some noncompliance with meds including insulin

## 2021-11-05 NOTE — Assessment & Plan Note (Addendum)
I suspect most likely due to uncontrolled DM with some element of non compliance, also to consider eval for diabetes insipidus if current eval not helpful

## 2021-11-05 NOTE — Patient Instructions (Addendum)
Please continue all other medications as before, and refills have been done if requested.  Please have the pharmacy call with any other refills you may need.  Please continue your efforts at being more active, low cholesterol diet, and weight control.  Please keep your appointments with your specialists as you may have planned  Please go to the LAB at the blood drawing area for the tests to be done  You will be contacted by phone if any changes need to be made immediately.  Otherwise, you will receive a letter about your results with an explanation, but please check with MyChart first.  Please remember to sign up for MyChart if you have not done so, as this will be important to you in the future with finding out test results, communicating by private email, and scheduling acute appointments online when needed.  Please see Dr [name] next available appt as well

## 2021-11-05 NOTE — Assessment & Plan Note (Signed)
BP Readings from Last 3 Encounters:  11/05/21 116/80  10/12/21 126/84  07/02/21 130/90   Stable, pt to continue medical treatment losartan

## 2021-11-08 ENCOUNTER — Encounter: Payer: Self-pay | Admitting: Internal Medicine

## 2021-11-10 LAB — BETA-HYDROXYBUTYRATE: Beta-Hydroxybutyric Acid: 0.26 mmol/L

## 2021-11-10 LAB — URINE CULTURE

## 2021-11-12 ENCOUNTER — Other Ambulatory Visit: Payer: Self-pay | Admitting: Internal Medicine

## 2021-11-12 DIAGNOSIS — E118 Type 2 diabetes mellitus with unspecified complications: Secondary | ICD-10-CM

## 2021-11-12 DIAGNOSIS — Z794 Long term (current) use of insulin: Secondary | ICD-10-CM

## 2021-11-12 DIAGNOSIS — E1165 Type 2 diabetes mellitus with hyperglycemia: Secondary | ICD-10-CM

## 2021-11-30 ENCOUNTER — Ambulatory Visit (INDEPENDENT_AMBULATORY_CARE_PROVIDER_SITE_OTHER): Payer: Commercial Managed Care - HMO

## 2021-11-30 DIAGNOSIS — E118 Type 2 diabetes mellitus with unspecified complications: Secondary | ICD-10-CM

## 2021-11-30 DIAGNOSIS — E785 Hyperlipidemia, unspecified: Secondary | ICD-10-CM

## 2021-11-30 DIAGNOSIS — D6859 Other primary thrombophilia: Secondary | ICD-10-CM

## 2021-11-30 DIAGNOSIS — I10 Essential (primary) hypertension: Secondary | ICD-10-CM

## 2021-11-30 DIAGNOSIS — Z794 Long term (current) use of insulin: Secondary | ICD-10-CM

## 2021-11-30 NOTE — Progress Notes (Signed)
Chronic Care Management Pharmacy Note  11/30/2021 Name:  Charles Parker MRN:  [id number] DOB:  1945/12/26  Summary: -Patient reports that he has been more compliant with medications and insulin dosing recently  -Recent blood sugars averaging 130-160 - denies any issues with low blood sugars  -BP controlled in office, patient does not currently monitor BP at home  Recommendations/Changes made from today's visit: -Patient taking all of his medications once daily, does not use a pill box to organize medications - notes to occasional missed doses but has been more compliant than he previous has been with his medications  -Reviewed with patient all current medications, reviewed BG targets, as BG reported as in range, recommending no changes, has follow up with endo in 3 weeks  -Encouraged use of pill box to aid in adherence, patient to consider -Advised for patient to have shingrix vaccine with next visit to pharmacy  -Reasonable to consider increase in statin intensity should LDL remain elevated despite increased adherence to medications / diet    Subjective: Charles Parker is an 76 y.o. year old male who is a primary patient of Janith Lima, MD.  The CCM team was consulted for assistance with disease management and care coordination needs.    Engaged with patient by telephone for follow up visit in response to provider referral for pharmacy case management and/or care coordination services.   Consent to Services:  The patient was given information about Chronic Care Management services, agreed to services, and gave verbal consent prior to initiation of services.  Please see initial visit note for detailed documentation.   Patient Care Team: Janith Lima, MD as PCP - General (Internal Medicine) Wilford Corner, MD as Consulting Physician (Gastroenterology) Philemon Kingdom, MD as Consulting Physician (Internal Medicine) Gardiner Barefoot, DPM as Consulting Physician  (Podiatry) Warden Fillers, MD as Consulting Physician (Ophthalmology) Delice Bison Darnelle Maffucci, [facility] (Pharmacist)   Patient lives at home alone. His wife has passed. He is working on improving blood sugars by walking, watching what he eats and taking medicines as prescribed.   Recent office visits: 11/05/2021 - Dr. Jenny Reichmann - polyuria - referred to urology - stressed importance of compliance to medications   Recent consult visits: 10/12/2021 - Dr. Dwyane Dee - Endo -  humalog mix 36 units twice daily - restart losartan at 72m daily  07/02/2021 - Dr. KDwyane Dee- Endo - start losartan 58m- follow up in 6 weeks   Hospital visits: None in previous 6 months   Objective:  Lab Results  Component Value Date   CREATININE 1.17 11/05/2021   BUN 18 11/05/2021   GFR 60.90 11/05/2021   GFRNONAA >60 05/09/2021   GFRAA >89 12/23/2016   NA 138 11/05/2021   K 4.4 11/05/2021   CALCIUM 10.0 11/05/2021   CO2 28 11/05/2021    Lab Results  Component Value Date/Time   HGBA1C 10.3 (H) 11/05/2021 02:56 PM   HGBA1C 11.2 Repeated and verified X2. (H) 08/18/2021 01:27 PM   FRUCTOSAMINE 417 (H) 10/09/2021 02:00 PM   FRUCTOSAMINE 546 (H) 06/25/2021 12:16 PM   GFR 60.90 11/05/2021 02:56 PM   GFR 67.08 10/09/2021 02:00 PM   MICROALBUR <0.7 11/07/2020 12:08 PM   MICROALBUR <0.7 02/04/2020 02:28 PM    Last diabetic Eye exam:  Lab Results  Component Value Date/Time   HMDIABEYEEXA No Retinopathy 04/10/2019 12:00 AM    Last diabetic Foot exam:  Lab Results  Component Value Date/Time   HMDIABFOOTEX No neuropathy 07/09/2020 12:00 AM  Lab Results  Component Value Date   CHOL 194 10/09/2021   HDL 52.10 10/09/2021   LDLCALC 111 (H) 10/09/2021   LDLDIRECT 115.0 07/02/2019   TRIG 153.0 (H) 10/09/2021   CHOLHDL 4 10/09/2021    Hepatic Function Latest Ref Rng & Units 11/05/2021 08/19/2020 05/12/2020  Total Protein 6.0 - 8.3 g/dL 8.1 7.0 -  Albumin 3.5 - 5.2 g/dL 4.8 4.1 4.4  AST 0 - 37 U/L 22 18 -  ALT 0 - 53 U/L 24  18 -  Alk Phosphatase 39 - 117 U/L 66 58 -  Total Bilirubin 0.2 - 1.2 mg/dL 0.6 0.7 -  Bilirubin, Direct 0.0 - 0.3 mg/dL 0.1 - -    Lab Results  Component Value Date/Time   TSH 1.55 11/07/2020 12:08 PM   TSH 0.67 09/08/2016 03:13 PM    CBC Latest Ref Rng & Units 11/05/2021 05/09/2021 02/05/2021  WBC 4.0 - 10.5 K/uL 4.6 6.2 4.6  Hemoglobin 13.0 - 17.0 g/dL 16.1 14.9 14.6  Hematocrit 39.0 - 52.0 % 48.5 44.9 42.4  Platelets 150.0 - 400.0 K/uL 166.0 141(L) 143.0(L)    Lab Results  Component Value Date/Time   VD25OH 21.10 (L) 11/07/2020 12:08 PM   VD25OH 37.61 03/29/2019 02:48 PM    Clinical ASCVD: No  The 10-year ASCVD risk score (Arnett DK, et al., 2019) is: 32%   Values used to calculate the score:     Age: 77 years     Sex: Male     Is Non-Hispanic African American: Yes     Diabetic: Yes     Tobacco smoker: No     Systolic Blood Pressure: 627 mmHg     Is BP treated: Yes     HDL Cholesterol: 52.1 mg/dL     Total Cholesterol: 194 mg/dL    Depression screen [facility] 2/9 05/07/2021 11/12/2020 12/12/2019  Decreased Interest 0 0 0  Down, Depressed, Hopeless 0 0 0  PHQ - 2 Score 0 0 0  Altered sleeping - - -  Tired, decreased energy - - -  Change in appetite - - -  Feeling bad or failure about yourself  - - -  Trouble concentrating - - -  Moving slowly or fidgety/restless - - -  Suicidal thoughts - - -  PHQ-9 Score - - -  Difficult doing work/chores - - -  Some recent data might be hidden     Social History   Tobacco Use  Smoking Status Never  Smokeless Tobacco Never   BP Readings from Last 3 Encounters:  11/05/21 116/80  10/12/21 126/84  07/02/21 130/90   Pulse Readings from Last 3 Encounters:  11/05/21 70  10/12/21 66  07/02/21 82   Wt Readings from Last 3 Encounters:  11/05/21 189 lb (85.7 kg)  10/12/21 190 lb (86.2 kg)  07/02/21 188 lb (85.3 kg)    Assessment/Interventions: Review of patient past medical history, allergies, medications, health status, including  review of consultants reports, laboratory and other test data, was performed as part of comprehensive evaluation and provision of chronic care management services.   SDOH:  (Social Determinants of Health) assessments and interventions performed: Yes    CCM Care Plan  Allergies  Allergen Reactions   Metformin And Related Diarrhea   Codeine Rash    All over the body    Medications Reviewed Today     Reviewed by Tomasa Blase, [facility] (Pharmacist) on 11/30/21 at 1000  Med List Status: <None>   Medication Order Taking?  Sig Documenting Provider Last Dose Status Informant  Alcohol Swabs PADS [id number]  Use to clean area before insulin injections. DX E11.8 Elayne Snare, MD  Active   blood glucose meter kit and supplies KIT [id number]  1 each by Does not apply route daily as needed. Accu-Chek Aviva preferred by patient. Use up to four times daily as directed. (FOR ICD-9 250.00, 250.01). [provider]  Active   Cholecalciferol 50 MCG (2000 UT) TABS [id number] No Take 1 tablet (2,000 Units total) by mouth daily.  Patient not taking: Reported on 11/30/2021   Janith Lima, MD Not Taking Active   Continuous Blood Gluc Receiver (FREESTYLE LIBRE 2 READER) [place]VI [id number] No 1 Act by Does not apply route daily.  Patient not taking: Reported on 11/30/2021   Janith Lima, MD Not Taking Active   Continuous Blood Gluc Sensor (FREESTYLE LIBRE 2 SENSOR) [place] [id number] No 1 Act by Does not apply route daily.  Patient not taking: Reported on 10/12/2021   Janith Lima, MD Not Taking Active   dapagliflozin propanediol (FARXIGA) 10 MG TABS tablet [id number] Yes Take 1 tablet (10 mg total) by mouth daily. Elayne Snare, MD Taking Active   gabapentin (NEURONTIN) 300 MG capsule [id number] Yes TAKE 1 CAPSULE(300 MG) BY MOUTH TWICE DAILY  Patient taking differently: Take 300 mg by mouth daily. TAKE 1 CAPSULE(300 MG) BY MOUTH TWICE DAILY   Elayne Snare, MD Taking Active   glimepiride (AMARYL) 4 MG tablet  [id number] Yes TAKE 1 TABLET BY MOUTH DAILY  BEFORE BREAKFAST Janith Lima, MD Taking Active   Glucagon (GVOKE HYPOPEN 2-PACK) 1 MG/0.2ML SOAJ [id number]  Inject 1 Act into the skin daily as needed. Janith Lima, MD  Active   glucose blood test strip [id number] Yes Use Accu Chek Aviva Test Strips as instructed to check blood sugar three times daily. Elayne Snare, MD Taking Active   Insulin Lispro Prot & Lispro (HUMALOG MIX 75/25 KWIKPEN) (75-25) 100 UNIT/ML Claiborne Rigg [id number] Yes 40 U acb and 36 U acs  Patient taking differently: Inject 36 Units into the skin in the morning and at bedtime.   Elayne Snare, MD Taking Active   Insulin Pen Needle (B-D UF III MINI PEN NEEDLES) 31G X 5 MM MISC [id number] Yes ADMINISTER 22 UNITS EVERY Sharol Given, MD Taking Active   losartan (COZAAR) 25 MG tablet [id number] Yes Take 1 tablet (25 mg total) by mouth daily. Elayne Snare, MD Taking Active   pioglitazone (ACTOS) 45 MG tablet [id number] Yes Take 1 tablet (45 mg total) by mouth daily. Biagio Borg, MD Taking Active   simvastatin (ZOCOR) 40 MG tablet [id number] Yes TAKE 1 TABLET BY MOUTH AT  BEDTIME Janith Lima, MD Taking Active   XARELTO 10 MG TABS tablet [id number] Yes TAKE 1 TABLET BY MOUTH DAILY Janith Lima, MD Taking Active             Patient Active Problem List   Diagnosis Date Noted   Polyuria 11/05/2021   Urinary incontinence 11/05/2021   Thrombocytopenia ([place]) 02/05/2021   Rapid palpitations 02/05/2021   Encounter for general adult medical examination with abnormal findings 11/07/2020   Elevated factor VIII level 10/08/2019   Candidal balanitis 03/29/2019   Erectile dysfunction due to diabetes mellitus ([place]) 03/29/2019   Essential hypertension 05/18/2018   Vitamin D insufficiency 05/18/2018   Hallux rigidus, right foot 02/14/2017   Degenerative arthritis of left knee 01/11/2017   OAB (overactive bladder) 01/09/2017   Medication  monitoring encounter 12/23/2016   Iron deficiency  12/16/2016   Type 2 diabetes mellitus with complication, with long-term current use of insulin ([place]) 12/15/2016   Hyperlipidemia with target LDL less than 70 10/21/2016   Thoracic spine fracture ([place]) 10/21/2016   Benign prostatic hyperplasia (BPH) with straining on urination 06/20/2013   Tubulovillous adenoma of colon with high grade dysplasia (rectosigmoid) s/p LAR with bladder repair 02/03/12. 12/27/2011    Immunization History  Administered Date(s) Administered   Fluad Quad(high Dose 65+) 07/17/2019, 07/08/2020   Influenza, High Dose Seasonal PF 06/13/2017, 07/06/2018   Influenza,inj,Quad PF,6+ Mos 06/20/2013, 07/10/2014   Influenza-Unspecified 06/05/2015, 07/26/2016, 11/04/2021   PFIZER(Purple Top)SARS-COV-2 Vaccination 10/26/2019, 11/16/2019, 07/19/2020   PPD Test 10/29/2016   Pneumococcal Conjugate-13 06/20/2013, 07/23/2014   Pneumococcal Polysaccharide-23 06/19/2015, 02/05/2021   Tdap 06/20/2013   Zoster Recombinat (Shingrix) 07/17/2019    Conditions to be addressed/monitored:  Hypertension, Hyperlipidemia, Diabetes, Osteoarthritis and Hypercoaguable state  Care Plan : [place]  Updates made by Tomasa Blase, RPH since 11/30/2021 12:00 AM     Problem: Hypertension, Hyperlipidemia, Diabetes, Osteoarthritis and Hypercoaguable state   Priority: High     Long-Range Goal: Disease management   Start Date: 11/17/2020  Expected End Date: 11/30/2022  This Visit's Progress: Not on track  Recent Progress: Not on track  Priority: High  Note:   Current Barriers:  Unable to independently monitor therapeutic efficacy Unable to achieve control of diabetes  Unable to self administer medications as prescribed  Pharmacist Clinical Goal(s):  Patient will achieve adherence to monitoring guidelines and medication adherence to achieve therapeutic efficacy achieve control of diabetes as evidenced by improvement in A1c achieve ability to self administer medications as  prescribed through use of insulin as evidenced by patient report through collaboration with PharmD and provider.   Interventions: 1:1 collaboration with Janith Lima, MD regarding development and update of comprehensive plan of care as evidenced by provider attestation and co-signature Inter-disciplinary care team collaboration (see longitudinal plan of care) Comprehensive medication review performed; medication list updated in electronic medical record  Hyperlipidemia: (LDL goal < 70) -Not ideally controlled  - questionable compliance to medication in months leading up to most recent lipid panel  Lab Results  Component Value Date   LDLCALC 111 (H) 10/09/2021  -Current treatment: Simvastatin 40 mg daily HS -Medications previously tried: ezetimibe -Educated on Cholesterol goals; Benefits of statin for ASCVD risk reduction; -Recommended to continue current medication  Hypertension (BP goal <130/80) -Controlled -Current treatment: Losartan 54m daily  -Medications previously tried: lisinopril, losartan, hctz, azilsartan, chlorthalidone  -Current home readings: n/a does not check at home  BP Readings from Last 3 Encounters:  11/05/21 116/80  10/12/21 126/84  07/02/21 130/90  -Current dietary habits: reports to eating a low sodium diet  -Denies hypotensive/hypertensive symptoms -Educated on BP goals and benefits of medications for prevention of heart attack, stroke and kidney damage; Daily salt intake goal < 2300 mg; Exercise goal of 150 minutes per week; -Counseled on diet and exercise extensively Recommended to continue current medication   Diabetes (A1c goal <8%) -Uncontrolled - per patient most recent BG averaging 130-160 - improving from most recent visit  Lab Results  Component Value Date   HGBA1C 10.3 (H) 11/05/2021  -Dx 2014. Insulin since 2016. -Current home glucose readings fasting glucose: 200-280 post prandial glucose: 300s -Current medications: Glimepiride 4  mg daily Farxiga 10 mg daily Pioglitazone 437mdaily  Novolog 70/30 - 36 units twice daily - patient reports  increased adherence since last visit with endo - does not to 2 missed doses of insulin since latest visit Gvoke Hypopen Accu-Chek glucometer -Medications previously tried: metformin (hives), Lantus, Januvia, Invokana -Current meal patterns: seen nutritionist, trying to "lay off the sweets" -Current exercise: walking most days -Educated on A1c and blood sugar goals; Proper insulin injection technique; Benefits of routine self-monitoring of blood sugar; -Counseled extensively on adherence to medications -Pt may benefit from GLP-1 agonist -will consult with endocrine  Hypercoaguable state/Hx DVT (Goal: prevent subsequent DVT) -Controlled - pt will be on Xarelto indefinitely due to Factor VIII elevation -Current treatment  Xarelto 10 mg daily -Recommended to continue current medication  Patient Goals/Self-Care Activities Patient will:  - take medications as prescribed -focus on medication adherence by establishing routine -check glucose most days, document, and provide at future appointments -target a minimum of 150 minutes of moderate intensity exercise weekly -engage in dietary modifications by following nutritionist's recommendations     Medication Assistance: None required.  Patient affirms current coverage meets needs.  Patient's preferred pharmacy is:  [facility] [phone] - [place], [place] - [address] [place] [address] Phone: [phone] Fax: [phone]  [facility] ([facility] ) - [place], [place] [address] Phone: [phone] Fax: [place], [place] - [address] AT [facility] [place] [address] Phone: [phone] Fax:  [phone]   Uses pill box? No - prefers bottles Pt endorses 90% compliance - forgets to refill meds sometimes  We discussed: Current pharmacy is preferred with insurance plan and patient is satisfied with pharmacy services Patient decided to: Continue current medication management strategy  Care Plan and Follow Up Patient Decision:  Patient agrees to Care Plan and Follow-up.  Plan: Telephone follow up appointment with care management team member scheduled for:  3-4 months  Tomasa Blase, PharmD Clinical Pharmacist, [place]

## 2021-11-30 NOTE — Patient Instructions (Signed)
Visit Information  Following are the goals we discussed today:   Manage My Medicine   Timeframe:  Long-Range Goal Priority:  High Start Date:    06/02/21                          Expected End Date:      11/30/2022                Follow Up Date June 2023   - call for medicine refill 2 or 3 days before it runs out - call if I am sick and can't take my medicine - keep a list of all the medicines I take; vitamins and herbals too    Why is this important?   These steps will help you keep on track with your medicines.  Plan: Telephone follow up appointment with care management team member scheduled for:  3-4 months The patient has been provided with contact information for the care management team and has been advised to call with any health related questions or concerns.   Tomasa Blase, PharmD Clinical Pharmacist, [place] [facility]   Please call the care guide team at [phone] if you need to cancel or reschedule your appointment.   Patient verbalizes understanding of instructions and care plan provided today and agrees to view in [place]. Active MyChart status confirmed with patient.

## 2021-12-01 DIAGNOSIS — E118 Type 2 diabetes mellitus with unspecified complications: Secondary | ICD-10-CM

## 2021-12-01 DIAGNOSIS — I10 Essential (primary) hypertension: Secondary | ICD-10-CM

## 2021-12-01 DIAGNOSIS — Z794 Long term (current) use of insulin: Secondary | ICD-10-CM

## 2021-12-01 DIAGNOSIS — E785 Hyperlipidemia, unspecified: Secondary | ICD-10-CM

## 2021-12-14 ENCOUNTER — Other Ambulatory Visit: Payer: Self-pay

## 2021-12-14 ENCOUNTER — Other Ambulatory Visit (INDEPENDENT_AMBULATORY_CARE_PROVIDER_SITE_OTHER): Payer: Medicare Other

## 2021-12-14 DIAGNOSIS — Z794 Long term (current) use of insulin: Secondary | ICD-10-CM | POA: Diagnosis not present

## 2021-12-14 DIAGNOSIS — E1165 Type 2 diabetes mellitus with hyperglycemia: Secondary | ICD-10-CM

## 2021-12-14 LAB — BASIC METABOLIC PANEL
BUN: 18 mg/dL (ref 6–23)
CO2: 25 mEq/L (ref 19–32)
Calcium: 9.5 mg/dL (ref 8.4–10.5)
Chloride: 103 mEq/L (ref 96–112)
Creatinine, Ser: 1.18 mg/dL (ref 0.40–1.50)
GFR: 60.24 mL/min (ref >60.00)
Glucose, Bld: 263 mg/dL — ABNORMAL HIGH (ref 70–99)
Potassium: 3.9 mEq/L (ref 3.5–5.1)
Sodium: 139 mEq/L (ref 135–145)

## 2021-12-14 LAB — HEMOGLOBIN A1C: Hgb A1c MFr Bld: 10.6 % — ABNORMAL HIGH (ref 4.6–6.5)

## 2021-12-17 ENCOUNTER — Ambulatory Visit (INDEPENDENT_AMBULATORY_CARE_PROVIDER_SITE_OTHER): Payer: Medicare Other | Admitting: Internal Medicine

## 2021-12-17 ENCOUNTER — Encounter: Payer: Self-pay | Admitting: Internal Medicine

## 2021-12-17 ENCOUNTER — Other Ambulatory Visit: Payer: Self-pay

## 2021-12-17 ENCOUNTER — Ambulatory Visit (INDEPENDENT_AMBULATORY_CARE_PROVIDER_SITE_OTHER): Payer: Medicare Other | Admitting: Endocrinology

## 2021-12-17 ENCOUNTER — Encounter: Payer: Self-pay | Admitting: Endocrinology

## 2021-12-17 VITALS — BP 136/84 | HR 84 | Ht 71.0 in | Wt 186.8 lb

## 2021-12-17 VITALS — BP 124/70 | HR 82 | Temp 98.0°F | Ht 71.0 in | Wt 186.0 lb

## 2021-12-17 DIAGNOSIS — E1165 Type 2 diabetes mellitus with hyperglycemia: Secondary | ICD-10-CM | POA: Diagnosis not present

## 2021-12-17 DIAGNOSIS — I10 Essential (primary) hypertension: Secondary | ICD-10-CM

## 2021-12-17 DIAGNOSIS — B3742 Candidal balanitis: Secondary | ICD-10-CM | POA: Diagnosis not present

## 2021-12-17 DIAGNOSIS — E119 Type 2 diabetes mellitus without complications: Secondary | ICD-10-CM

## 2021-12-17 DIAGNOSIS — Z794 Long term (current) use of insulin: Secondary | ICD-10-CM

## 2021-12-17 DIAGNOSIS — R32 Unspecified urinary incontinence: Secondary | ICD-10-CM

## 2021-12-17 DIAGNOSIS — N3281 Overactive bladder: Secondary | ICD-10-CM | POA: Diagnosis not present

## 2021-12-17 LAB — URINALYSIS, ROUTINE W REFLEX MICROSCOPIC
Bilirubin Urine: NEGATIVE
Hgb urine dipstick: NEGATIVE
Ketones, ur: NEGATIVE
Leukocytes,Ua: NEGATIVE
Nitrite: NEGATIVE
RBC / HPF: NONE SEEN (ref 0–?)
Specific Gravity, Urine: 1.02 (ref 1.000–1.030)
Total Protein, Urine: NEGATIVE
Urine Glucose: >=1000 — AB
Urobilinogen, UA: 0.2 (ref 0.0–1.0)
pH: 5.5 (ref 5.0–8.0)

## 2021-12-17 MED ORDER — INSULIN LISPRO PROT & LISPRO (75-25 MIX) 100 UNIT/ML KWIKPEN: 36.0000 [IU] | PEN_INJECTOR | Freq: Two times a day (BID) | SUBCUTANEOUS | 1 refills | Status: DC

## 2021-12-17 MED ORDER — BD PEN NEEDLE MINI U/F 31G X 5 MM MISC: 1.0000 | Freq: Two times a day (BID) | 2 refills | Status: DC

## 2021-12-17 MED ORDER — SOLIFENACIN SUCCINATE 10 MG PO TABS: 10.0000 mg | ORAL_TABLET | Freq: Every day | ORAL | 1 refills | Status: AC

## 2021-12-17 MED ORDER — KETOCONAZOLE 2 % EX CREA: 1.0000 "application " | TOPICAL_CREAM | Freq: Two times a day (BID) | CUTANEOUS | 3 refills | Status: AC

## 2021-12-17 NOTE — Progress Notes (Signed)
? ?Subjective:  ?Patient ID: Charles Parker, male    DOB: 1946-01-12  Age: 76 y.o. MRN: [id number] ? ?CC: Diabetes ? ?This visit occurred during the SARS-CoV-2 public health emergency.  Safety protocols were in place, including screening questions prior to the visit, additional usage of staff PPE, and extensive cleaning of exam room while observing appropriate contact time as indicated for disinfecting solutions.   ? ?HPI ?[name] presents for f/up- ? ?He complains of polyuria.  Its not clear to me that he is using his insulin.  He denies abdominal pain, weight loss, chest pain, shortness of breath, diaphoresis, dizziness, lightheadedness, or edema. ? ?Outpatient Medications Prior to Visit  ?Medication Sig Dispense Refill  ? Alcohol Swabs PADS Use to clean area before insulin injections. DX E11.8 200 each 3  ? blood glucose meter kit and supplies KIT 1 each by Does not apply route daily as needed. Accu-Chek Aviva preferred by patient. Use up to four times daily as directed. (FOR ICD-9 250.00, 250.01).    ? Cholecalciferol 50 MCG (2000 UT) TABS Take 1 tablet (2,000 Units total) by mouth daily. 90 tablet 3  ? Continuous Blood Gluc Receiver (FREESTYLE LIBRE 2 READER) DEVI 1 Act by Does not apply route daily. 2 each 5  ? Continuous Blood Gluc Sensor (FREESTYLE LIBRE 2 SENSOR) MISC 1 Act by Does not apply route daily. 2 each 5  ? dapagliflozin propanediol (FARXIGA) 10 MG TABS tablet Take 1 tablet (10 mg total) by mouth daily. 90 tablet 1  ? gabapentin (NEURONTIN) 300 MG capsule TAKE 1 CAPSULE(300 MG) BY MOUTH TWICE DAILY (Patient taking differently: Take 300 mg by mouth daily. TAKE 1 CAPSULE(300 MG) BY MOUTH TWICE DAILY) 180 capsule 1  ? glimepiride (AMARYL) 4 MG tablet TAKE 1 TABLET BY MOUTH DAILY  BEFORE BREAKFAST 90 tablet 0  ? Glucagon (GVOKE HYPOPEN 2-PACK) 1 MG/0.2ML SOAJ Inject 1 Act into the skin daily as needed. 2 mL 5  ? glucose blood test strip Use Accu Chek Aviva Test Strips as instructed to check  blood sugar three times daily. 100 each 3  ? losartan (COZAAR) 25 MG tablet Take 1 tablet (25 mg total) by mouth daily. 90 tablet 1  ? pioglitazone (ACTOS) 45 MG tablet Take 1 tablet (45 mg total) by mouth daily. 90 tablet 3  ? simvastatin (ZOCOR) 40 MG tablet TAKE 1 TABLET BY MOUTH AT  BEDTIME 90 tablet 0  ? XARELTO 10 MG TABS tablet TAKE 1 TABLET BY MOUTH DAILY 90 tablet 0  ? Insulin Lispro Prot & Lispro (HUMALOG MIX 75/25 KWIKPEN) (75-25) 100 UNIT/ML Kwikpen 40 U acb and 36 U acs (Patient taking differently: Inject 36 Units into the skin in the morning and at bedtime.) 15 mL 1  ? Insulin Pen Needle (B-D UF III MINI PEN NEEDLES) 31G X 5 MM MISC ADMINISTER 30 UNITS EVERY EVENING 100 each 2  ? ?No facility-administered medications prior to visit.  ? ? ?ROS ?Review of Systems  ?Constitutional:  Negative for appetite change, diaphoresis and fatigue.  ?HENT: Negative.    ?Eyes: Negative.   ?Respiratory:  Negative for cough, chest tightness, shortness of breath and wheezing.   ?Cardiovascular:  Negative for chest pain, palpitations and leg swelling.  ?Gastrointestinal:  Negative for abdominal pain, constipation, diarrhea, nausea and vomiting.  ?Endocrine: Positive for polyuria. Negative for polydipsia and polyphagia.  ?Genitourinary:  Positive for frequency and urgency. Negative for decreased urine volume, difficulty urinating, dysuria, hematuria, penile swelling and scrotal  swelling.  ?Musculoskeletal: Negative.  Negative for arthralgias and myalgias.  ?Skin: Negative.   ?Neurological:  Negative for dizziness and weakness.  ?Hematological:  Negative for adenopathy. Does not bruise/bleed easily.  ?Psychiatric/Behavioral: Negative.    ? ?Objective:  ?BP 124/70 (BP Location: Left Arm, Patient Position: Sitting, Cuff Size: Large)   Pulse 82   Temp 98 ?F (36.7 ?C) (Oral)   Ht _0  (1.803 m)   Wt 186 lb (84.4 kg)   SpO2 96%   BMI 25.94 kg/m?  ? ?BP Readings from Last 3 Encounters:  ?12/17/21 124/70  ?12/17/21 136/84   ?11/05/21 116/80  ? ? ?Wt Readings from Last 3 Encounters:  ?12/17/21 186 lb (84.4 kg)  ?12/17/21 186 lb 12.8 oz (84.7 kg)  ?11/05/21 189 lb (85.7 kg)  ? ? ?Physical Exam ?Vitals reviewed. Exam conducted with a chaperone present [place] Nurse).  ?HENT:  ?   Nose: Nose normal.  ?   Mouth/Throat:  ?   Mouth: Mucous membranes are moist.  ?Eyes:  ?   General: No scleral icterus. ?   Conjunctiva/sclera: Conjunctivae normal.  ?Cardiovascular:  ?   Rate and Rhythm: Normal rate and regular rhythm.  ?   Heart sounds: No murmur heard. ?Pulmonary:  ?   Effort: Pulmonary effort is normal.  ?   Breath sounds: No stridor. No wheezing, rhonchi or rales.  ?Abdominal:  ?   General: Abdomen is flat.  ?   Palpations: There is no mass.  ?   Tenderness: There is no abdominal tenderness. There is no guarding.  ?   Hernia: No hernia is present. There is no hernia in the left inguinal area or right inguinal area.  ?Genitourinary: ?   Pubic Area: No rash.   ?   Penis: Circumcised. Erythema and discharge present. No phimosis, paraphimosis, hypospadias, tenderness, swelling or lesions.   ?   Testes: Normal.  ?   Epididymis:  ?   Right: Normal.  ?   Left: Normal.  ?   Prostate: Not enlarged, not tender and no nodules present.  ?   Rectum: Normal. Guaiac result negative. No mass, tenderness, anal fissure, external hemorrhoid or internal hemorrhoid. Normal anal tone.  ?Musculoskeletal:     ?   General: Normal range of motion.  ?   Cervical back: Neck supple.  ?   Right lower leg: No edema.  ?   Left lower leg: No edema.  ?Lymphadenopathy:  ?   Cervical: No cervical adenopathy.  ?   Lower Body: No right inguinal adenopathy. No left inguinal adenopathy.  ?Skin: ?   General: Skin is warm and dry.  ?   Findings: No lesion.  ?Neurological:  ?   General: No focal deficit present.  ?Psychiatric:     ?   Mood and Affect: Mood normal.     ?   Behavior: Behavior normal.  ? ? ?Lab Results  ?Component Value Date  ? WBC 4.6 11/05/2021  ? HGB 16.1 11/05/2021   ? HCT 48.5 11/05/2021  ? PLT 166.0 11/05/2021  ? GLUCOSE 263 (H) 12/14/2021  ? CHOL 194 10/09/2021  ? TRIG 153.0 (H) 10/09/2021  ? HDL 52.10 10/09/2021  ? LDLDIRECT 115.0 07/02/2019  ? LDLCALC 111 (H) 10/09/2021  ? ALT 24 11/05/2021  ? AST 22 11/05/2021  ? NA 139 12/14/2021  ? K 3.9 12/14/2021  ? CL 103 12/14/2021  ? CREATININE 1.18 12/14/2021  ? BUN 18 12/14/2021  ? CO2 25 12/14/2021  ? TSH  1.55 11/07/2020  ? PSA 1.82 11/07/2020  ? INR 1.37 09/26/2016  ? HGBA1C 10.6 (H) 12/14/2021  ? MICROALBUR <0.7 11/07/2020  ? ? ?CT Angio Head W or Wo Contrast ? ?Result Date: 05/09/2021 ?CLINICAL DATA:  Neck trauma (Age >= 65y); Stroke/TIA, assess extracranial arteries. Left-sided neck pain and swelling for 1 week. EXAM: CT ANGIOGRAPHY HEAD AND NECK TECHNIQUE: Multidetector CT imaging of the head and neck was performed using the standard protocol during bolus administration of intravenous contrast. Multiplanar CT image reconstructions and MIPs were obtained to evaluate the vascular anatomy. Carotid stenosis measurements (when applicable) are obtained utilizing NASCET criteria, using the distal internal carotid diameter as the denominator. CONTRAST:  22m OMNIPAQUE IOHEXOL 350 MG/ML SOLN COMPARISON:  Head CT 10/07/2016 FINDINGS: CT HEAD FINDINGS Brain: There is no evidence of an acute infarct, intracranial hemorrhage, mass, midline shift, or extra-axial fluid collection. There is mild cerebral atrophy. A cavum septum pellucidum et vergae is noted, a normal variant. Vascular: Calcified atherosclerosis at the skull base. No hyperdense vessel. Skull: No fracture or suspicious osseous lesion. Sinuses: Mild left maxillary sinus mucosal thickening. Clear mastoid air cells. Orbits: Unremarkable. Review of the MIP images confirms the above findings CTA NECK FINDINGS Aortic arch: Normal variant aortic arch branching pattern with common origin of the brachiocephalic and left common carotid arteries. Mild atherosclerotic plaque without arch  vessel origin stenosis. Right carotid system: Patent with minimal calcified plaque at the carotid bifurcation. No evidence of dissection or stenosis. Left carotid system: Patent without evidence of dissectio

## 2021-12-17 NOTE — Progress Notes (Signed)
3

## 2021-12-17 NOTE — Patient Instructions (Addendum)
Insulin 40 units am and BEFORE SUPPER ? ?Stop GLIMEPERIDE ?

## 2021-12-17 NOTE — Progress Notes (Signed)
? Patient ID: Charles Parker, male   DOB: 1945/12/11, 76 y.o.   MRN: [id number] ?       ? ? ?Reason for Appointment: Follow-up for Type 2 Diabetes ? ?Referring physician: Scarlette Calico ? ? ?History of Present Illness:  ?        ?Date of diagnosis of type 2 diabetes mellitus: 2014       ? ?Background history:  ? ?He has been on various oral hypoglycemic drugs since the onset including metformin, Januvia and Amaryl ?Was also on Invokana in 2015 for about a year but not clear why this was stopped ?His level of control has been quite variable with highest A1c 13.6 in 06/2015 ? ?He has been on insulin since about 10/2014 as documented in his record ? ?Recent history:  ? ?INSULIN regimen is:  NovoLog mix 36 units before breakfast and 36 units before dinner ? ?Non-insulin hypoglycemic drugs the patient is taking TGG:YIRSWNI 10 mg daily ? ?His A1c is 10.6 compared to 11.2 ? ?Fructosamine is last at 417 although previously higher ? ? ?Current management, blood sugar patterns and problems identified: ?He is again not taking his insulin doses consistently ?Although he has been asked to keep a record of his insulin doses for better compliance he does not do so  ?He again thinks that because of being preoccupied with family issues he is not finding the time to check his blood sugar also  ?His last blood sugar was 370 done about 6 weeks ago  ?Lab glucose readings appear to be consistently over 200 he is taking his insulin regularly does admit that periodically he will forget  ?He thinks he is following instructions about insulin with 36 units twice a day although now he says that last night he took his evening insulin at bedtime instead of before supper ?He thinks he is taking his [place] regularly ?He was seen by his PCP and has been given glimepiride and Actos also without apparent improvement in his blood sugars ? ?Side effects from medications have been: Hives from metformin ? ?Compliance with the medical regimen:  Inconsistent ? ?Glucose monitoring:  done< 1 times a   day usually        Glucometer: Accu-Chek guide      ? ?Blood Glucose readings minimal recently  ? ? ?  ?Self-care: The diet that the patient has been following is: tries to limit [employer] .  ?    ?Typical meal intake: Breakfast is eggs/cereal or oatmeal usually, lunch:  Lean cuisine or sandwiches.  Dinner chicken and vegetables.   ?Snacks will be chips, peanut butter crackers ?Supper at 6 pm  ?              ?Dietician visit, most recent:3/19 ?             ? ?Weight history: ? ?Wt Readings from Last 3 Encounters:  ?12/17/21 186 lb 12.8 oz (84.7 kg)  ?11/05/21 189 lb (85.7 kg)  ?10/12/21 190 lb (86.2 kg)  ? ? ?Glycemic control: ?  ?Lab Results  ?Component Value Date  ? HGBA1C 10.6 (H) 12/14/2021  ? HGBA1C 10.3 (H) 11/05/2021  ? HGBA1C 11.2 Repeated and verified X2. (H) 08/18/2021  ? ?Lab Results  ?Component Value Date  ? MICROALBUR <0.7 11/07/2020  ? LDLCALC 111 (H) 10/09/2021  ? CREATININE 1.18 12/14/2021  ? ?Lab Results  ?Component Value Date  ? MICRALBCREAT 1.4 11/07/2020  ? ? ?Lab Results  ?Component Value Date  ?  FRUCTOSAMINE 417 (H) 10/09/2021  ? FRUCTOSAMINE 546 (H) 06/25/2021  ? FRUCTOSAMINE 660 (H) 10/28/2020  ? ? ? ? ?Allergies as of 12/17/2021   ? ?   Reactions  ? Metformin And Related Diarrhea  ? Codeine Rash  ? All over the body  ? ?  ? ?  ?Medication List  ?  ? ?  ? Accurate as of December 17, 2021  1:27 PM. If you have any questions, ask your nurse or doctor.  ?  ?  ? ?  ? ?Alcohol Swabs Pads ?Use to clean area before insulin injections. DX E11.8 ?  ?B-D UF III MINI PEN NEEDLES 31G X 5 MM Misc ?Generic drug: Insulin Pen Needle ?ADMINISTER 30 UNITS EVERY EVENING ?  ?blood glucose meter kit and supplies Kit ?1 each by Does not apply route daily as needed. Accu-Chek Aviva preferred by patient. Use up to four times daily as directed. (FOR ICD-9 250.00, 250.01). ?  ?Cholecalciferol 50 MCG (2000 UT) Tabs ?Take 1 tablet (2,000 Units total) by mouth daily. ?   ?dapagliflozin propanediol 10 MG Tabs tablet ?Commonly known as: [place] ?Take 1 tablet (10 mg total) by mouth daily. ?  ?FreeStyle Centuria 2 Reader Kerrin Mo ?1 Act by Does not apply route daily. ?  ?FreeStyle Libre 2 Sensor Misc ?1 Act by Does not apply route daily. ?  ?gabapentin 300 MG capsule ?Commonly known as: NEURONTIN ?TAKE 1 CAPSULE(300 MG) BY MOUTH TWICE DAILY ?What changed:  ?how much to take ?how to take this ?when to take this ?  ?glimepiride 4 MG tablet ?Commonly known as: AMARYL ?TAKE 1 TABLET BY MOUTH DAILY  BEFORE BREAKFAST ?  ?glucose blood test strip ?Use Accu Chek Aviva Test Strips as instructed to check blood sugar three times daily. ?  ?Gvoke HypoPen 2-Pack 1 MG/0.2ML Soaj ?Generic drug: Glucagon ?Inject 1 Act into the skin daily as needed. ?  ?Insulin Lispro Prot & Lispro (75-25) 100 UNIT/ML Kwikpen ?Commonly known as: HumaLOG Mix 75/25 KwikPen ?40 U acb and 36 U acs ?What changed:  ?how much to take ?how to take this ?when to take this ?additional instructions ?  ?losartan 25 MG tablet ?Commonly known as: COZAAR ?Take 1 tablet (25 mg total) by mouth daily. ?  ?pioglitazone 45 MG tablet ?Commonly known as: Actos ?Take 1 tablet (45 mg total) by mouth daily. ?  ?simvastatin 40 MG tablet ?Commonly known as: ZOCOR ?TAKE 1 TABLET BY MOUTH AT  BEDTIME ?  ?Xarelto 10 MG Tabs tablet ?Generic drug: rivaroxaban ?TAKE 1 TABLET BY MOUTH DAILY ?  ? ?  ? ? ?Allergies:  ?Allergies  ?Allergen Reactions  ? Metformin And Related Diarrhea  ? Codeine Rash  ?  All over the body  ? ? ?Past Medical History:  ?Diagnosis Date  ? Clotting disorder ([place])   ? DVT  ? Diabetes mellitus   ? type II  ? Diverticulosis   ? DVT (deep venous thrombosis) ([place])   ? Glaucoma   ? no eye drops   ? Hepatic cyst   ? Hypercholesterolemia   ? Hypertension   ? Renal cyst   ? Sleep apnea   ? Tubulovillous adenoma   ? ? ?Past Surgical History:  ?Procedure Laterality Date  ? BLADDER NECK RECONSTRUCTION  02/03/2012  ? Procedure: BLADDER NECK REPAIR;   Surgeon: Adin Hector, MD;  Location: [facility];  Service: General;;  ? COLONOSCOPY  02/2013  ? polyp removal(mult.)  ? KNEE SURGERY  2004  ?  left  ? POLYPECTOMY    ? PROCTOSCOPY  02/03/2012  ? Procedure: PROCTOSCOPY;  Surgeon: Adin Hector, MD;  Location: [facility];  Service: General;;  ? STOMACH SURGERY  02/2012  ? TEE WITHOUT CARDIOVERSION N/A 10/05/2016  ? Procedure: TRANSESOPHAGEAL ECHOCARDIOGRAM (TEE);  Surgeon: Sanda Klein, MD;  Location: [facility];  Service: Cardiovascular;  Laterality: N/A;  ? ? ?Family History  ?Problem Relation Age of Onset  ? Hypertension Mother   ? Diabetes Mother   ? Cancer Mother   ?     ? location  ? Colon cancer Father 62  ? Colon polyps Neg Hx   ? Liver cancer Neg Hx   ? ? ?Social History:  reports that he has never smoked. He has never used smokeless tobacco. He reports that he does not drink alcohol and does not use drugs. ? ? ?Review of Systems ? ? ?Lipid history: Has been taking simvastatin 40 mg with variable control, followed by PCP ?He has variable compliance with his medication likely ?  ?Lab Results  ?Component Value Date  ? CHOL 194 10/09/2021  ? CHOL 153 08/19/2020  ? CHOL 202 (H) 05/12/2020  ? ?Lab Results  ?Component Value Date  ? HDL 52.10 10/09/2021  ? HDL 43.00 08/19/2020  ? HDL 50.80 05/12/2020  ? ?Lab Results  ?Component Value Date  ? LDLCALC 111 (H) 10/09/2021  ? [place] 84 08/19/2020  ? LDLCALC 124 (H) 05/12/2020  ? ?Lab Results  ?Component Value Date  ? TRIG 153.0 (H) 10/09/2021  ? TRIG 130.0 08/19/2020  ? TRIG 134.0 05/12/2020  ? ?Lab Results  ?Component Value Date  ? CHOLHDL 4 10/09/2021  ? CHOLHDL 4 08/19/2020  ? CHOLHDL 4 05/12/2020  ? ?Lab Results  ?Component Value Date  ? LDLDIRECT 115.0 07/02/2019  ? LDLDIRECT 118.0 11/11/2015  ? LDLDIRECT 201.0 03/04/2015  ? ?     ?   He has history of neuropathy, not symptomatic and not taking gabapentin prescribed by PCP ? ?Most recent foot exam: 2/22 ? ?RENAL function is consistently normal with continuing Farxiga  10 mg ? ?Lab Results  ?Component Value Date  ? CREATININE 1.18 12/14/2021  ? CREATININE 1.17 11/05/2021  ? CREATININE 1.08 10/09/2021  ? ? ?Blood pressure : He has slightly better blood pressure overall and is ta

## 2021-12-21 ENCOUNTER — Telehealth: Payer: Self-pay | Admitting: *Deleted

## 2021-12-21 NOTE — Chronic Care Management (AMB) (Signed)
?  Care Management  ? ?Note ? ?12/21/2021 ?Name: Charles Parker MRN: [id number] DOB: 05-16-1946 ? ?Charles Parker is a 76 y.o. year old male who is a primary care patient of Janith Lima, MD and is actively engaged with the care management team. I reached out to [name] by phone today to assist with scheduling an initial visit with the RN Case Manager and Licensed Clinical Social Worker ? ?Follow up plan: ?Unsuccessful telephone outreach attempt made. A HIPAA compliant phone message was left for the patient providing contact information and requesting a return call.  ?The care management team will reach out to the patient again over the next 7 days.  ?If patient returns call to provider office, please advise to call Tea at [phone]. ? ?Laverda Sorenson  ?Care Guide, Embedded Care Coordination ?[place]  Care Management  ?Direct Dial: [phone] ? ?

## 2021-12-22 ENCOUNTER — Encounter: Payer: Self-pay | Admitting: Licensed Clinical Social Worker

## 2021-12-22 ENCOUNTER — Ambulatory Visit (INDEPENDENT_AMBULATORY_CARE_PROVIDER_SITE_OTHER): Payer: Medicare Other | Admitting: Licensed Clinical Social Worker

## 2021-12-22 DIAGNOSIS — I10 Essential (primary) hypertension: Secondary | ICD-10-CM

## 2021-12-22 DIAGNOSIS — Z7189 Other specified counseling: Secondary | ICD-10-CM

## 2021-12-22 DIAGNOSIS — E118 Type 2 diabetes mellitus with unspecified complications: Secondary | ICD-10-CM

## 2021-12-22 NOTE — Chronic Care Management (AMB) (Signed)
?Chronic Care Management  ? Clinical Social Work Note ? ?12/22/2021 ?Name: Charles Parker MRN: [id number] DOB: 09-Dec-1945 ? ?Charles Parker is a 76 y.o. year old male who is a primary care patient of Janith Lima, MD. The CCM team was consulted to assist the patient with chronic disease management and/or care coordination needs related to:  assessment of needs .  ? ?Engaged with patient by telephone for initial visit in response to provider referral for social work chronic care management and care coordination services.  ? ?Consent to Services:  ?The patient was given the following information about Chronic Care Management services today, agreed to services, and gave verbal consent: 1. CCM service includes personalized support from designated clinical staff supervised by the primary care provider, including individualized plan of care and coordination with other care providers 2. 24/7 contact phone numbers for assistance for urgent and routine care needs. 3. Service will only be billed when office clinical staff spend 20 minutes or more in a month to coordinate care. 4. Only one practitioner may furnish and bill the service in a calendar month. 5.The patient may stop CCM services at any time (effective at the end of the month) by phone call to the office staff. 6. The patient will be responsible for cost sharing (co-pay) of up to 20% of the service fee (after annual deductible is met). Patient agreed to services and consent obtained. ? ?Patient agreed to services and consent obtained.  ? ?Summary: Assessed patient's previous and current treatment, coping skills, support system and barriers to care. No social needs identified .  See Care Plan below for interventions and patient self-care actives. ? ?Recommendation: Patient may benefit from, and is in agreement to work on advance directive.  ? ?Follow up Plan: Patient would like continued follow-up from CCM LCSW.  per patient's request will follow up in 4 weeks.   Will call office if needed prior to next encounter. ?  ?Assessment: Review of patient past medical history, allergies, medications, and health status, including review of relevant consultants reports was performed today as part of a comprehensive evaluation and provision of chronic care management and care coordination services.    ? ?SDOH (Social Determinants of Health) assessments and interventions performed:  ?SDOH Interventions   ? ?Flowsheet Row Most Recent Value  ?SDOH Interventions   ?Stress Interventions Provide Counseling  ? ?  ?  ? ?Advanced Directives Status: See Care Plan for related entries. ? ?CCM Care Plan ?Conditions to be addressed/monitored: HTN and DMII;  ? ?Care Plan : LCSW Plan of Care  ?Updates made by Maurine Cane, LCSW since 12/22/2021 12:00 AM  ?  ? ?Problem: Long-Term Care Planning   ?  ? ?Goal: Effective Long-Term Care Planning with Advance Directives   ?Start Date: 12/22/2021  ?This Visit's Progress: On track  ?Priority: High  ?Note:   ?Current Barriers:  ?No Advanced Directives in place ? ?CSW Clinical Goal(s):  ?patient will work with LCSW and family to address needs related to no advance directive  through collaboration with [facility] representative, provider, and care team.  ? ?Interventions: ?1:1 collaboration with primary care provider regarding development and update of comprehensive plan of care as evidenced by provider attestation and co-signature ?Inter-disciplinary care team collaboration (see longitudinal plan of care) ?Evaluation of current treatment plan related to  self management and patient's adherence to plan as established by provider ?Review resources, discussed options and provided patient information about  ?Department of Social Services (  has food stamps and medicaid ) ?Enhanced Benefits connected with insurance provider:(has benefit card will activate it this week) ?Advance Directive Education (provided) ? ?Level of Care Concerns in a patient with HTN and DMII:   (Status: New goal.) ?Current level of care: home, alone ?Evaluation of patient safety in current living environment ?PHQ2/ PHQ9 completed ?Solution-Focused Strategies employed:  ?Active listening / Reflection utilized  ?Assessed understanding of Advance Directives. , A voluntary discussion about advanced care planning including importance of advanced directives, healthcare proxy and living will was discussed., EMMI educational information on Advance Directives sent via e-mail , and Advance directive packet mailed   ? ?Task & activities to accomplish goals: ?Call to activate your enhanced benefits card ?Review EMMI educational information on Advance Directive. Look for an e-mail from [facility]. ?Complete Advance Directive packet,  ?Have advance directive notarized and provide a copy to provider office    ?  ?  ?Casimer Lanius, LCSW ?Licensed Clinical Social Worker [facility]  ?[place]  ?[phone]  ?

## 2021-12-22 NOTE — Chronic Care Management (AMB) (Signed)
?  Care Management  ? ?Note ? ?12/22/2021 ?Name: [name] MRN: [id number] DOB: 03/23/1946 ? ?Charles Parker is a 76 y.o. year old male who is a primary care patient of Janith Lima, MD and is actively engaged with the care management team. I reached out to [name] by phone today to assist with scheduling an initial visit with the RN Case Manager and Licensed Clinical Social Worker ? ? ?[name] was given information about Chronic Care Management services today including:  ?CCM service includes personalized support from designated clinical staff supervised by his physician, including individualized plan of care and coordination with other care providers ?24/7 contact phone numbers for assistance for urgent and routine care needs. ?Service will only be billed when office clinical staff spend 20 minutes or more in a month to coordinate care. ?Only one practitioner may furnish and bill the service in a calendar month. ?The patient may stop CCM services at any time (effective at the end of the month) by phone call to the office staff. ?The patient is responsible for co-pay (up to 20% after annual deductible is met) if co-pay is required by the individual health plan.  ? ?Patient agreed to services and verbal consent obtained.  ?  ?Follow up plan: ?Telephone appointment with care management team member scheduled for:SW 12/22/21 and [name] 12/25/21 ? ? ? ?Laverda Sorenson  ?Care Guide, Embedded Care Coordination ?[place]  Care Management  ?Direct Dial: [phone] ? ?

## 2021-12-22 NOTE — Patient Instructions (Signed)
Visit Information  ? ?Thank you for taking time to visit with me today. Please don't hesitate to contact me if I can be of assistance to you before our next scheduled telephone appointment. ? ?Following are the goals we discussed today: advance Directive  ? ?Our next appointment is by telephone on April 24th at 1:30 ? ?Please call the care guide team at [phone] if you need to cancel or reschedule your appointment.  ? ?If you are experiencing a Mental Health or [place] or need someone to talk to, please call 1-800-273-TALK (toll free, 24 hour hotline)  ? ?Following is a copy of your full care plan:  ?Care Plan : [place]  ?Updates made by Maurine Cane, LCSW since 12/22/2021 12:00 AM  ?  ? ?Problem: Long-Term Care Planning   ?  ? ?Goal: Effective Long-Term Care Planning with Advance Directives   ?Start Date: 12/22/2021  ?This Visit's Progress: On track  ?Priority: High  ?Note:   ?Current Barriers:  ?No Advanced Directives in place ? ?CSW Clinical Goal(s):  ?patient will work with LCSW and family to address needs related to no advance directive  through collaboration with [facility] representative, provider, and care team.  ? ?Interventions: ?1:1 collaboration with primary care provider regarding development and update of comprehensive plan of care as evidenced by provider attestation and co-signature ?Inter-disciplinary care team collaboration (see longitudinal plan of care) ?Evaluation of current treatment plan related to  self management and patient's adherence to plan as established by provider ?Review resources, discussed options and provided patient information about  ?Department of Social Services ( has food stamps and medicaid ) ?Enhanced Benefits connected with insurance provider:(has benefit card will activate it this week) ?Advance Directive Education (provided) ? ?Level of Care Concerns in a patient with HTN and DMII:  (Status: New goal.) ?Current level of care: home,  alone ?Evaluation of patient safety in current living environment ?PHQ2/ PHQ9 completed ?Solution-Focused Strategies employed:  ?Active listening / Reflection utilized  ?Assessed understanding of Advance Directives. , A voluntary discussion about advanced care planning including importance of advanced directives, healthcare proxy and living will was discussed., EMMI educational information on Advance Directives sent via e-mail , and Advance directive packet mailed   ? ?Task & activities to accomplish goals: ?Call to activate your enhanced benefits card ?Review EMMI educational information on Advance Directive. Look for an e-mail from [facility]. ?Complete Advance Directive packet,  ?Have advance directive notarized and provide a copy to provider office    ?  ? ?Consent to CCM Services: ?Mr. Lapaglia was given information about Chronic Care Management services including:  ?CCM service includes personalized support from designated clinical staff supervised by his physician, including individualized plan of care and coordination with other care providers ?24/7 contact phone numbers for assistance for urgent and routine care needs. ?Service will only be billed when office clinical staff spend 20 minutes or more in a month to coordinate care. ?Only one practitioner may furnish and bill the service in a calendar month. ?The patient may stop CCM services at any time (effective at the end of the month) by phone call to the office staff. ?The patient will be responsible for cost sharing (co-pay) of up to 20% of the service fee (after annual deductible is met). ? ?Patient agreed to services and verbal consent obtained.  ? ?The patient verbalized understanding of instructions, educational materials, and care plan provided today and agreed to receive a mailed  copy of patient instructions, educational materials, and care plan.  ? ? ? ? ?  ?

## 2021-12-25 ENCOUNTER — Ambulatory Visit: Payer: Medicare Other | Admitting: *Deleted

## 2021-12-25 DIAGNOSIS — E118 Type 2 diabetes mellitus with unspecified complications: Secondary | ICD-10-CM

## 2021-12-25 DIAGNOSIS — I10 Essential (primary) hypertension: Secondary | ICD-10-CM

## 2021-12-25 NOTE — Patient Instructions (Signed)
Visit Information  ? ?Charles Parker, "AD," thank you for taking time to talk with me today. Please don't hesitate to contact me if I can be of assistance to you before our next scheduled telephone appointment ? ?Below are the goals we discussed today:  ?Patient Self-Care Activities: ?Patient Charles Parker  will: ?Take medications as prescribed ?Attend all scheduled provider appointments ?Call pharmacy for medication refills ?Call provider office for new concerns or questions ?Continue to check fasting (first thing in the morning, before eating) blood sugars at home; check your blood sugar again later in the day, 2 hours after eating a regular sized meal ?Write down your blood sugars at home so we can review these together when we talk over the phone next ?Continue to follow heart healthy, low salt, low cholesterol, carbohydrate-modified, low sugar diet ?Keep up the great work preventing falls-- continue to use your cane as/ if needed ?Review enclosed educational material about what kind of foods you should and should not be eating when you have diabetes and high blood pressure ? ?Our next scheduled telephone follow up visit/ appointment is scheduled on:   Wednesday, January 13, 2022 at 11:30 am- This is a PHONE CALL appointment ? ?If you need to cancel or re-schedule our visit, please call [phone] and our care guide team will be happy to assist you. ?  ?I look forward to hearing about your progress. ?  ?Oneta Rack, RN, BSN, CCRN Alumnus ?[facility] ?([phone]: direct office ? ?If you are experiencing a Mental Health or [place] or need someone to talk to, please  ?call the Suicide and Crisis Lifeline: 988 ?call the [place] National Suicide Prevention Lifeline: 513-113-6667 or TTY: 302-073-0967 TTY (978) 195-6671) to talk to a trained counselor ?call 1-800-273-TALK (toll free, 24 hour hotline) ?go to [facility] [address] [place] [phone]) ?call 911  ? ?Diabetes Mellitus and Nutrition, Adult ?When you have diabetes, or diabetes mellitus, it is very important to have healthy eating habits because your blood sugar (glucose) levels are greatly affected by what you eat and drink. Eating healthy foods in the right amounts, at about the same times every day, can help you: ?Manage your blood glucose. ?Lower your risk of heart disease. ?Improve your blood pressure. ?Reach or maintain a healthy weight. ?What can affect my meal plan? ?Every person with diabetes is different, and each person has different needs for a meal plan. Your health care provider may recommend that you work with a dietitian to make a meal plan that is best for you. Your meal plan may vary depending on factors such as: ?The calories you need. ?The medicines you take. ?Your weight. ?Your blood glucose, blood pressure, and cholesterol levels. ?Your activity level. ?Other health conditions you have, such as heart or kidney disease. ?How do carbohydrates affect me? ?Carbohydrates, also called carbs, affect your blood glucose level more than any other type of food. Eating carbs raises the amount of glucose in your blood. ?It is important to know how many carbs you can safely have in each meal. This is different for every person. Your dietitian can help you calculate how many carbs you should have at each meal and for each snack. ?How does alcohol affect me? ?Alcohol can cause a decrease in blood glucose (hypoglycemia), especially if you use insulin or take certain diabetes medicines by mouth. Hypoglycemia can be a life-threatening condition. Symptoms of hypoglycemia, such as sleepiness, dizziness,  and confusion, are similar to symptoms of having too much alcohol. ?Do not drink alcohol if: ?Your health care provider tells you not to drink. ?You are pregnant, may be pregnant, or are planning to become pregnant. ?If you drink alcohol: ?Limit how much you have  to: ?0-1 drink a day for women. ?0-2 drinks a day for men. ?Know how much alcohol is in your drink. In the [place] one drink equals one 12 oz bottle of beer (355 mL), one 5 oz glass of wine (148 mL), or one 1? oz glass of hard liquor (44 mL). ?Keep yourself hydrated with water, diet soda, or unsweetened iced tea. Keep in mind that regular soda, juice, and other mixers may contain a lot of sugar and must be counted as carbs. ?What are tips for following this plan? ?Reading food labels ?Start by checking the serving size on the Nutrition Facts label of packaged foods and drinks. The number of calories and the amount of carbs, fats, and other nutrients listed on the label are based on one serving of the item. Many items contain more than one serving per package. ?Check the total grams (g) of carbs in one serving. ?Check the number of grams of saturated fats and trans fats in one serving. Choose foods that have a low amount or none of these fats. ?Check the number of milligrams (mg) of salt (sodium) in one serving. Most people should limit total sodium intake to less than 2,300 mg per day. ?Always check the nutrition information of foods labeled as "low-fat" or "nonfat." These foods may be higher in added sugar or refined carbs and should be avoided. ?Talk to your dietitian to identify your daily goals for nutrients listed on the label. ?Shopping ?Avoid buying canned, pre-made, or processed foods. These foods tend to be high in fat, sodium, and added sugar. ?Shop around the outside edge of the grocery store. This is where you will most often find fresh fruits and vegetables, bulk grains, fresh meats, and fresh dairy products. ?Cooking ?Use low-heat cooking methods, such as baking, instead of high-heat cooking methods, such as deep frying. ?Cook using healthy oils, such as olive, canola, or sunflower oil. ?Avoid cooking with butter, cream, or high-fat meats. ?Meal planning ?Eat meals and snacks regularly, preferably at the  same times every day. Avoid going long periods of time without eating. ?Eat foods that are high in fiber, such as fresh fruits, vegetables, beans, and whole grains. ?Eat 4-6 oz (112-168 g) of lean protein each day, such as lean meat, chicken, fish, eggs, or tofu. One ounce (oz) (28 g) of lean protein is equal to: ?1 oz (28 g) of meat, chicken, or fish. ?1 egg. ?? cup (62 g) of tofu. ?Eat some foods each day that contain healthy fats, such as avocado, nuts, seeds, and fish. ? ?What foods should I eat? ?Fruits ?Berries. Apples. Oranges. Peaches. Apricots. Plums. Grapes. Mangoes. Papayas. Pomegranates. Kiwi. Cherries. ?Vegetables ?Leafy greens, including lettuce, spinach, kale, chard, collard greens, mustard greens, and cabbage. Beets. Cauliflower. Broccoli. Carrots. Green beans. Tomatoes. Peppers. Onions. Cucumbers. Brussels sprouts. ?Grains ?Whole grains, such as whole-wheat or whole-grain bread, crackers, tortillas, cereal, and pasta. Unsweetened oatmeal. Quinoa. Brown or wild rice. ?Meats and other proteins ?Seafood. Poultry without skin. Lean cuts of poultry and beef. Tofu. Nuts. Seeds. ?Dairy ?Low-fat or fat-free dairy products such as milk, yogurt, and cheese. ?The items listed above may not be a complete list of foods and beverages you can eat and drink. Contact a  dietitian for more information. ? ?What foods should I avoid? ?Fruits ?Fruits canned with syrup. ?Vegetables ?Canned vegetables. Frozen vegetables with butter or cream sauce. ?Grains ?Refined white flour and flour products such as bread, pasta, snack foods, and cereals. Avoid all processed foods. ?Meats and other proteins ?Fatty cuts of meat. Poultry with skin. Breaded or fried meats. Processed meat. Avoid saturated fats. ?Dairy ?Full-fat yogurt, cheese, or milk. ?Beverages ?Sweetened drinks, such as soda or iced tea. ?The items listed above may not be a complete list of foods and beverages you should avoid. Contact a dietitian for more  information. ? ?Questions to ask a health care provider ?Do I need to meet with a certified diabetes care and education specialist? ?Do I need to meet with a dietitian? ?What number can I call if I have questions? ?When

## 2021-12-25 NOTE — Chronic Care Management (AMB) (Signed)
?Chronic Care Management  ? ?CCM RN Visit Note ? ?12/25/2021 ?Name: Charles Parker MRN: [id number] DOB: [birth year] ? ?Subjective: ?Charles Parker is a 76 y.o. year old male who is a primary care patient of Janith Lima, MD. The care management team was consulted for assistance with disease management and care coordination needs.   ? ?Engaged with patient by telephone for initial visit in response to provider referral for case management and/or care coordination services.  ? ?Consent to Services:  ?The patient was given information about Chronic Care Management services, agreed to services, and gave verbal consent 11/17/20 by [place] team prior to initiation of services.  Please see initial visit note for detailed documentation.  Patient agreed to services and verbal consent obtained.  ? ?Assessment: Review of patient past medical history, allergies, medications, health status, including review of consultants reports, laboratory and other test data, was performed as part of comprehensive evaluation and provision of chronic care management services.  ? ?SDOH (Social Determinants of Health) assessments and interventions performed:  ?SDOH Interventions   ? ?Flowsheet Row Most Recent Value  ?SDOH Interventions   ?Food Insecurity Interventions Intervention Not Indicated  ?Housing Interventions Intervention Not Indicated  [lives in senior citizen apartment x 15 years- first level,  reports apartment is upgraded for senior safety]  ?Transportation Interventions Intervention Not Indicated  ? ?  ? ?CCM Care Plan ? ?Allergies  ?Allergen Reactions  ? Metformin And Related Diarrhea  ? Codeine Rash  ?  All over the body  ? ?Outpatient Encounter Medications as of 12/25/2021  ?Medication Sig  ? Alcohol Swabs PADS Use to clean area before insulin injections. DX E11.8  ? blood glucose meter kit and supplies KIT 1 each by Does not apply route daily as needed. Accu-Chek Aviva preferred by patient. Use up to four times daily as  directed. (FOR ICD-9 250.00, 250.01).  ? Cholecalciferol 50 MCG (2000 UT) TABS Take 1 tablet (2,000 Units total) by mouth daily.  ? Continuous Blood Gluc Receiver (FREESTYLE LIBRE 2 READER) DEVI 1 Act by Does not apply route daily.  ? Continuous Blood Gluc Sensor (FREESTYLE LIBRE 2 SENSOR) MISC 1 Act by Does not apply route daily.  ? dapagliflozin propanediol (FARXIGA) 10 MG TABS tablet Take 1 tablet (10 mg total) by mouth daily.  ? gabapentin (NEURONTIN) 300 MG capsule TAKE 1 CAPSULE(300 MG) BY MOUTH TWICE DAILY (Patient taking differently: Take 300 mg by mouth daily. TAKE 1 CAPSULE(300 MG) BY MOUTH TWICE DAILY)  ? glimepiride (AMARYL) 4 MG tablet TAKE 1 TABLET BY MOUTH DAILY  BEFORE BREAKFAST  ? Glucagon (GVOKE HYPOPEN 2-PACK) 1 MG/0.2ML SOAJ Inject 1 Act into the skin daily as needed.  ? glucose blood test strip Use Accu Chek Aviva Test Strips as instructed to check blood sugar three times daily.  ? Insulin Lispro Prot & Lispro (HUMALOG MIX 75/25 KWIKPEN) (75-25) 100 UNIT/ML Kwikpen Inject 36 Units into the skin in the morning and at bedtime.  ? Insulin Pen Needle (B-D UF III MINI PEN NEEDLES) 31G X 5 MM MISC Inject 1 Act into the skin 2 (two) times daily. ADMINISTER 30 UNITS EVERY EVENING  ? ketoconazole (NIZORAL) 2 % cream Apply 1 application. topically 2 (two) times daily.  ? losartan (COZAAR) 25 MG tablet Take 1 tablet (25 mg total) by mouth daily.  ? pioglitazone (ACTOS) 45 MG tablet Take 1 tablet (45 mg total) by mouth daily.  ? simvastatin (ZOCOR) 40 MG tablet TAKE 1 TABLET BY  MOUTH AT  BEDTIME  ? solifenacin (VESICARE) 10 MG tablet Take 1 tablet (10 mg total) by mouth daily.  ? XARELTO 10 MG TABS tablet TAKE 1 TABLET BY MOUTH DAILY  ? ?No facility-administered encounter medications on file as of 12/25/2021.  ? ?Patient Active Problem List  ? Diagnosis Date Noted  ? Urinary incontinence 11/05/2021  ? Encounter for general adult medical examination with abnormal findings 11/07/2020  ? Elevated factor VIII  level 10/08/2019  ? Candidal balanitis 03/29/2019  ? Erectile dysfunction due to diabetes mellitus ([place]) 03/29/2019  ? Essential hypertension 05/18/2018  ? Vitamin D insufficiency 05/18/2018  ? Hallux rigidus, right foot 02/14/2017  ? Degenerative arthritis of left knee 01/11/2017  ? OAB (overactive bladder) 01/09/2017  ? Iron deficiency 12/16/2016  ? Type 2 diabetes mellitus with complication, with long-term current use of insulin ([place]) 12/15/2016  ? Hyperlipidemia with target LDL less than 70 10/21/2016  ? Thoracic spine fracture ([place]) 10/21/2016  ? Benign prostatic hyperplasia (BPH) with straining on urination 06/20/2013  ? 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 12/27/2011  ? ?Conditions to be addressed/monitored:  HTN and DMII ? ?Care Plan : RN Care Manager Plan of Care  ?Updates made by Knox Royalty, RN since 12/25/2021 12:00 AM  ?  ? ?Problem: Chronic Disease management Needs   ?Priority: High  ?  ? ?Long-Range Goal: Development of plan of care for long term chronic disease management   ?Start Date: 12/25/2021  ?Expected End Date: 12/26/2022  ?Priority: High  ?Note:   ?Current Barriers:  ?Chronic Disease Management support and education needs related to HTN and DMII ?Lives alone- senior apartments, first level; reports apartment management currently starting to improve apartments for safety updating ? ?[name] Clinical Goal(s):  ?Patient will demonstrate improved health management independence as evidenced by adherence to plan of care for DMII and HTN        through collaboration with RN Care manager, provider, and care team.  ? ?Interventions: ?1:1 collaboration with primary care provider regarding development and update of comprehensive plan of care as evidenced by provider attestation and co-signature ?Inter-disciplinary care team collaboration (see longitudinal plan of care) ?Evaluation of current treatment plan related to  self management  and patient's adherence to plan as established by provider ?12/25/21: ?Review of patient status, including review of consultants reports, relevant laboratory and other test results, and medications completed ?CCM RN CM Initial assessment initiated ?SDOH updated: no unmet concerns identified ?Pain assessment updated: denies pain today ?Falls assessment updated: denies recent falls x 12 months- does not use assistive devices regularly, occasionally uses cane;  positive reinforcement provided with encouragement to continue efforts at fall prevention; previously provided education around fall risks/ prevention reinforced ?Medications discussed: reports independently self-manages medications and denies current concerns/ issues/ questions around medications; endorses adherence to taking all medications as prescribed; confirmed CCM Pharmacy team active ?Reviewed upcoming scheduled provider appointments: 01/25/22- CCM CSW; 02/25/22- endocrinology provider; 03/30/22- [place] team; patient confirms is aware of all and has plans to attend as scheduled ?Discussed plans with patient for ongoing care management follow up and provided patient with direct contact information for care management team   ' ? ?Diabetes:  (Status: 12/25/21: New goal.) Long Term Goal  ? ?Lab Results  ?Component Value Date  ? HGBA1C 10.6 (H) 12/14/2021  ?  ?Assessed patient's understanding of A1c goal: <7% ?Provided education to patient about basic DM disease process; ?Counseled on importance  of regular laboratory monitoring as prescribed;        ?Reviewed recent provider office visit with endocrinology provider, Dr. Dwyane Dee, on 12/17/21 ?Assessed patient's understanding of meaning/ significance of A1-C values: poor baseline understanding of same- Reviewed individual historical A1-C trends and provided education around correlation of A1-C value to blood sugar levels at home over 3 months ?Confirmed patient monitoring blood sugars at home: he is very  unfocused when trying to review home blood sugars: he states "they are all looking fine;" and reports "it was "102 this morning;" he states his "highest recent"value has been "160- but I am unable to determine i

## 2022-01-01 DIAGNOSIS — Z794 Long term (current) use of insulin: Secondary | ICD-10-CM | POA: Diagnosis not present

## 2022-01-01 DIAGNOSIS — E118 Type 2 diabetes mellitus with unspecified complications: Secondary | ICD-10-CM

## 2022-01-01 DIAGNOSIS — I10 Essential (primary) hypertension: Secondary | ICD-10-CM

## 2022-01-13 ENCOUNTER — Ambulatory Visit (INDEPENDENT_AMBULATORY_CARE_PROVIDER_SITE_OTHER): Payer: Medicare Other | Admitting: *Deleted

## 2022-01-13 DIAGNOSIS — I10 Essential (primary) hypertension: Secondary | ICD-10-CM

## 2022-01-13 DIAGNOSIS — Z794 Long term (current) use of insulin: Secondary | ICD-10-CM

## 2022-01-13 NOTE — Patient Instructions (Addendum)
Visit Information ? ?AD, [name]) thank you for taking time to talk with me today. Please don't hesitate to contact me if I can be of assistance to you before our next scheduled telephone appointment ? ?Below are the goals we discussed today:  ?Patient Self-Care Activities: ?Patient Charles Parker  will: ?Take medications as prescribed ?Attend all scheduled provider appointments ?Call pharmacy for medication refills ?Call provider office for new concerns or questions ?Continue to check fasting (first thing in the morning, before eating) blood sugars at home; check your blood sugar again later in the day, 2 hours after eating a regular sized meal ?Write down your blood sugars at home so we can review these together when we talk over the phone next: the blood sugars we reviewed today are all in very good range ?Continue to follow heart healthy, low salt, low cholesterol, carbohydrate-modified, low sugar diet ?Keep up the great work preventing falls-- continue to use your cane as/ if needed ? ?Our next scheduled telephone follow up visit/ appointment is scheduled on: Monday, March 08, 2022 at 9:45 am- This is a PHONE CALL appointment ? ?If you need to cancel or re-schedule our visit, please call [phone] and our care guide team will be happy to assist you. ?  ?I look forward to hearing about your progress. ?  ?Oneta Rack, RN, BSN, CCRN Alumnus ?[place] ?[phone]: direct office ? ?If you are experiencing a Mental Health or [place] or need someone to talk to, please  ?call the Suicide and Crisis Lifeline: 988 ?call the [place] National Suicide Prevention Lifeline: (631) 162-6896 or TTY: 8014340204 TTY [phone]) to talk to a trained counselor ?call 1-800-273-TALK (toll free, 24 hour hotline) ?go to [facility] [address], [place] [phone]) ?call 911  ? ?The patient verbalized understanding of  instructions, educational materials, and care plan provided today and agreed to receive a mailed copy of patient instructions, educational materials, and care plan  ? ?Living With Diabetes ?Diabetes (type 1 diabetes mellitus or type 2 diabetes mellitus) is a condition in which the body does not have enough of a hormone called insulin, or the body does not respond properly to insulin. Normally, insulin allows sugars (glucose) to enter cells in the body. With diabetes, extra glucose builds up in the blood instead of going into cells. This results in high blood glucose (hyperglycemia). ?How to manage lifestyle changes ?Managing diabetes includes medical treatments as well as lifestyle changes. If diabetes is not managed well, serious physical and emotional complications can occur. Taking good care of yourself means that you are responsible for: ?Monitoring glucose regularly. ?Eating a healthy diet. ?Exercising regularly. ?Meeting with health care providers. ?Taking medicines as directed. ?Most people feel some stress about managing their diabetes. When this stress becomes too much, it is known as diabetes-related distress. This is very common. Living with diabetes can place you at risk for diabetes distress, depression, or anxiety. These disorders can make diabetes more difficult to manage. ?How to recognize stress ?You may have diabetes distress if you: ?Avoid or ignore your daily diabetes care. This includes glucose testing, following a meal plan, and taking medications. ?Feel overwhelmed by your daily diabetes care. ?Experience emotional reactions such as anger, sadness, or fear related to your daily diabetes care. ?Feel fear or shame about not doing everything perfectly that you have been told to do. ?Emotional distress ?Symptoms of diabetes distress include: ?Anger about having a diagnosis  of diabetes. ?Fear or frustration about your diagnosis and the changes you need to make to manage the condition. ?Being overly  worried about the care that you need or the cost of the care that you need. ?Feeling like you caused your condition by doing something wrong. ?Fear about unpredictable fluctuations in your blood glucose, like low or high blood glucose. ?Feeling judged by your health care providers. ?Feeling very alone with the disease. ?Depression ?Having diabetes means that you are at a higher risk for depression. Your health care provider may test (screen) you for symptoms of depression. It is important to recognize symptoms and to start treatment for depression soon after it is diagnosed. The following are some symptoms of depression: ?Loss of interest in things that you used to enjoy. ?Feeling depressed much or most of the time. ?A change in appetite. ?Trouble getting to sleep or staying asleep. ?Feeling tired most of the day. ?Feeling nervous and anxious. ?Feeling guilty and worrying that you are a burden to others. ?Having thoughts of hurting yourself or feeling that you want to die. ?If you have any of these symptoms, more days than not, for 2 weeks or longer, you may have depression. This would be a good time to contact your health care provider. ?Follow these instructions at home: ?Managing diabetes distress ?The following are some ways to manage emotional distress: ?Learn as much as you can about diabetes and its treatment. Take one step at a time to improve your management. ?Meet with a certified diabetes care and education specialist. Take a class to learn how to manage your condition. ?Consider working with a Social worker or therapist. ?Keep a journal of your thoughts and concerns. ?Accept that some things are out of your control. ?Talk with other people who have diabetes. It can help to talk about the distress that you feel. ?Find ways to manage stress that work for you. These may include art or music therapy, exercise, meditation, and hobbies. ?Seek support from spiritual leaders, family, and friends. ? ?General  instructions ?Do your best to follow your diabetes management plan. ?If you are struggling to follow your plan, talk with a certified diabetes care and education specialist, or with someone else who has diabetes. They may have ideas that will help. ?Forgive yourself for not being perfect. Almost everyone struggles with the tasks of diabetes. ?Keep all follow-up visits. This is important. ?Where to find support ?Search for information and support from the American Diabetes Association: www.diabetes.org ?Find a certified diabetes education and care specialist. Make an appointment through the Association of Diabetes Care & Education Specialists: www.diabeteseducator.org ?Contact a health care provider if: ?You believe your diabetes is getting out of control. ?You are concerned you may be depressed. ?You think your medications are not helping control your diabetes. ?You are feeling overwhelmed with your diabetes. ?Get help right away if: ?You have thoughts about hurting yourself or others. ?If you ever feel like you may hurt yourself or others, or have thoughts about taking your own life, get help right away. You can go to your nearest emergency department or call: ?Your local emergency services (911 in the [place] ?A suicide crisis helpline, such as the [place] at (603) 775-3418 or 988 in the [place]. This is open 24 hours a day. ?Summary ?Diabetes (type 1 diabetes mellitus or type 2 diabetes mellitus) is a condition in which the body does not have enough of a hormone called insulin, or the body does not respond properly to insulin. ?  Living with diabetes puts you at risk for medical and emotional issues, such as diabetes distress, depression, and anxiety. ?Recognizing the symptoms of diabetes distress and depression may help you avoid problems with your diabetes control. If you experience symptoms, it is important to discuss this with your health care provider, certified diabetes care and  education specialist, or therapist. ?It is important to start treatment for diabetes distress and depression soon after diagnosis. ?Ask your health care provider to recommend a therapist who understands both depression an

## 2022-01-13 NOTE — Chronic Care Management (AMB) (Signed)
?Chronic Care Management  ? ?CCM RN Visit Note ? ?01/13/2022 ?Name: Charles Parker MRN: [id number] DOB: 05/12/1946 ? ?Subjective: ?Charles Parker is a 75 y.o. year old male who is a primary care patient of Jones, Thomas L, MD. The care management team was consulted for assistance with disease management and care coordination needs.   ? ?Engaged with patient by telephone for follow up visit in response to provider referral for case management and/or care coordination services.  ? ?Consent to Services:  ?The patient was given information about Chronic Care Management services, agreed to services, and gave verbal consent prior to initiation of services.  Please see initial visit note for detailed documentation.  ?Patient agreed to services and verbal consent obtained.  ? ?Assessment: Review of patient past medical history, allergies, medications, health status, including review of consultants reports, laboratory and other test data, was performed as part of comprehensive evaluation and provision of chronic care management services.  ?CCM Care Plan ? ?Allergies  ?Allergen Reactions  ? Metformin And Related Diarrhea  ? Codeine Rash  ?  All over the body  ? ?Outpatient Encounter Medications as of 01/13/2022  ?Medication Sig  ? Alcohol Swabs PADS Use to clean area before insulin injections. DX E11.8  ? blood glucose meter kit and supplies KIT 1 each by Does not apply route daily as needed. Accu-Chek Aviva preferred by patient. Use up to four times daily as directed. (FOR ICD-9 250.00, 250.01).  ? Cholecalciferol 50 MCG (2000 UT) TABS Take 1 tablet (2,000 Units total) by mouth daily.  ? Continuous Blood Gluc Receiver (FREESTYLE LIBRE 2 READER) DEVI 1 Act by Does not apply route daily.  ? Continuous Blood Gluc Sensor (FREESTYLE LIBRE 2 SENSOR) MISC 1 Act by Does not apply route daily.  ? dapagliflozin propanediol (FARXIGA) 10 MG TABS tablet Take 1 tablet (10 mg total) by mouth daily.  ? gabapentin (NEURONTIN) 300 MG capsule  TAKE 1 CAPSULE(300 MG) BY MOUTH TWICE DAILY (Patient taking differently: Take 300 mg by mouth daily. TAKE 1 CAPSULE(300 MG) BY MOUTH TWICE DAILY)  ? glimepiride (AMARYL) 4 MG tablet TAKE 1 TABLET BY MOUTH DAILY  BEFORE BREAKFAST  ? Glucagon (GVOKE HYPOPEN 2-PACK) 1 MG/0.2ML SOAJ Inject 1 Act into the skin daily as needed.  ? glucose blood test strip Use Accu Chek Aviva Test Strips as instructed to check blood sugar three times daily.  ? Insulin Lispro Prot & Lispro (HUMALOG MIX 75/25 KWIKPEN) (75-25) 100 UNIT/ML Kwikpen Inject 36 Units into the skin in the morning and at bedtime.  ? Insulin Pen Needle (B-D UF III MINI PEN NEEDLES) 31G X 5 MM MISC Inject 1 Act into the skin 2 (two) times daily. ADMINISTER 30 UNITS EVERY EVENING  ? ketoconazole (NIZORAL) 2 % cream Apply 1 application. topically 2 (two) times daily.  ? losartan (COZAAR) 25 MG tablet Take 1 tablet (25 mg total) by mouth daily.  ? pioglitazone (ACTOS) 45 MG tablet Take 1 tablet (45 mg total) by mouth daily.  ? simvastatin (ZOCOR) 40 MG tablet TAKE 1 TABLET BY MOUTH AT  BEDTIME  ? solifenacin (VESICARE) 10 MG tablet Take 1 tablet (10 mg total) by mouth daily.  ? XARELTO 10 MG TABS tablet TAKE 1 TABLET BY MOUTH DAILY  ? ?No facility-administered encounter medications on file as of 01/13/2022.  ? ?Patient Active Problem List  ? Diagnosis Date Noted  ? Urinary incontinence 11/05/2021  ? Encounter for general adult medical examination with abnormal findings 11/07/2020  ?   Elevated factor VIII level 10/08/2019  ? Candidal balanitis 03/29/2019  ? Erectile dysfunction due to diabetes mellitus ([place]) 03/29/2019  ? Essential hypertension 05/18/2018  ? Vitamin D insufficiency 05/18/2018  ? Hallux rigidus, right foot 02/14/2017  ? Degenerative arthritis of left knee 01/11/2017  ? OAB (overactive bladder) 01/09/2017  ? Iron deficiency 12/16/2016  ? Type 2 diabetes mellitus with complication, with long-term current use of insulin ([place]) 12/15/2016  ? Hyperlipidemia with  target LDL less than 70 10/21/2016  ? Thoracic spine fracture ([place]) 10/21/2016  ? Benign prostatic hyperplasia (BPH) with straining on urination 06/20/2013  ? 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 12/27/2011  ? ?Conditions to be addressed/monitored:  HTN and DMII ? ?Care Plan : RN Care Manager Plan of Care  ?Updates made by Knox Royalty, RN since 01/13/2022 12:00 AM  ?  ? ?Problem: Chronic Disease management Needs   ?Priority: High  ?  ? ?Long-Range Goal: Ongoing adherence to established plan of care for long term chronic disease management   ?Start Date: 12/25/2021  ?Expected End Date: 12/26/2022  ?Priority: High  ?Note:   ?Current Barriers:  ?Chronic Disease Management support and education needs related to HTN and DMII ?Lives alone- senior apartments, first level; reports apartment management currently starting to improve apartments for safety updating ?Daughter lives in [place] and patient occasionally visits with his daughter ? ?[name] Clinical Goal(s):  ?Patient will demonstrate improved health management independence as evidenced by adherence to plan of care for DMII and HTN        through collaboration with RN Care manager, provider, and care team.  ? ?Interventions: ?1:1 collaboration with primary care provider regarding development and update of comprehensive plan of care as evidenced by provider attestation and co-signature ?Inter-disciplinary care team collaboration (see longitudinal plan of care) ?Evaluation of current treatment plan related to  self management and patient's adherence to plan as established by provider ?Review of patient status, including review of consultants reports, relevant laboratory and other test results, and medications completed ?Pain assessment updated: denies pain today ?Falls assessment updated: continues to deny new/ recent falls x 12 months- continues using cane as needed/ indicated;  positive  reinforcement provided with encouragement to continue efforts at fall prevention; previously provided education around fall risks/ prevention reinforced ?Medications discussed:  reports continues to independently self-manage and denies current concerns/ issues/ questions around medications; endorses adherence to taking all medications as prescribed ?Reviewed upcoming scheduled provider appointments: 02/25/22- endocrinology provider; 03/30/22- [facility] team; patient confirms is aware of all and has plans to attend as scheduled ?Discussed plans with patient for ongoing care management follow up and provided patient with direct contact information for care management team    ? ?Diabetes:  (Status: 01/12/22: Goal on Track (progressing): YES.) Long Term Goal  ? ?Lab Results  ?Component Value Date  ? HGBA1C 10.6 (H) 12/14/2021  ?  ?Provided education to patient about basic DM disease process; ?Reviewed prescribed diet with patient carbohydrate modified, low sugar/ heart healthy low salt; ?Counseled on importance of regular laboratory monitoring as prescribed;        ?Confirmed patient continues monitoring blood sugars at home: today he reports general fasting blood sugar ranges between 105-160; post-prandial blood sugar ranges between 100-160 ?Endorses monitoring blood sugars twice daily: generally fasting and then again later in day ?Reinforced previously provided education around general goals for blood sugar values at home- both fasting and post-prandial: discussed optimal  timing of blood sugar monitoring ?Provided education around signs/ symptoms low blood sugar along with corresponding action plan: he continues to deny recent signs/ symptoms of low blood sugar and we reviewed these briefly along with corresponding action for same ?Confirmed patient continues taking insulin as prescribed: he reports taking "38 U" BID ?Reinforced previously provided education around appropriate dietary choices in setting of DMII/ HTN:  benefits of following heart healthy, low salt, low carbohydrate/ low sugar diet were again stressed ? ?Hypertension: (Status: 01/13/22: Goal on Track (progressing): YES.) Long Term Goal  ?Last practice record

## 2022-01-25 ENCOUNTER — Telehealth: Payer: Medicare Other

## 2022-01-25 ENCOUNTER — Telehealth: Payer: Self-pay | Admitting: Licensed Clinical Social Worker

## 2022-01-25 NOTE — Chronic Care Management (AMB) (Signed)
? ?   Clinical Social Work  ?Care Management  ? Phone Outreach  ? ? ?01/25/2022 ?Name: IRENE MITCHAM MRN: [id number] DOB: 1946-04-09 ? ?CHRISTOPHR CALIX is a 76 y.o. year old male who is a primary care patient of Janith Lima, MD .  ? ?Reason for referral: Advanced Directive Education.   ?Two attempts made today. ? ?F/U phone call today to assess needs, progress and barriers with care plan goals.   Telephone outreach was unsuccessful. A HIPPA compliant phone message was left for the patient providing contact information and requesting a return call.  ? ?Plan:CCM LCSW will wait for return call. If no return call is received, Will route chart to Care Guide to see if patient's caregiver would like to reschedule phone appointment  ? ?Review of patient status, including review of consultants reports, relevant laboratory and other test results, and collaboration with appropriate care team members and the patient's provider was performed as part of comprehensive patient evaluation and provision of care management services.   ? ?Casimer Lanius, LCSW ?Licensed Clinical Social Worker [facility]  ?[place]  ?[phone]  ? ?  ? ? ?  ?

## 2022-01-26 ENCOUNTER — Other Ambulatory Visit: Payer: Self-pay | Admitting: Internal Medicine

## 2022-01-26 DIAGNOSIS — H1711 Central corneal opacity, right eye: Secondary | ICD-10-CM | POA: Diagnosis not present

## 2022-01-26 DIAGNOSIS — E119 Type 2 diabetes mellitus without complications: Secondary | ICD-10-CM | POA: Diagnosis not present

## 2022-01-26 DIAGNOSIS — H40013 Open angle with borderline findings, low risk, bilateral: Secondary | ICD-10-CM | POA: Diagnosis not present

## 2022-01-26 DIAGNOSIS — H2513 Age-related nuclear cataract, bilateral: Secondary | ICD-10-CM | POA: Diagnosis not present

## 2022-01-26 DIAGNOSIS — D6859 Other primary thrombophilia: Secondary | ICD-10-CM

## 2022-01-26 DIAGNOSIS — Z794 Long term (current) use of insulin: Secondary | ICD-10-CM | POA: Diagnosis not present

## 2022-01-26 DIAGNOSIS — E785 Hyperlipidemia, unspecified: Secondary | ICD-10-CM

## 2022-01-26 LAB — HM DIABETES EYE EXAM

## 2022-01-27 ENCOUNTER — Telehealth: Payer: Self-pay | Admitting: *Deleted

## 2022-01-27 ENCOUNTER — Encounter: Payer: Self-pay | Admitting: Endocrinology

## 2022-01-27 NOTE — Chronic Care Management (AMB) (Signed)
?  Care Management  ? ?Note ? ?01/27/2022 ?Name: Charles Parker MRN: [id number] DOB: 04/18/1946 ? ?Charles Parker is a 76 y.o. year old male who is a primary care patient of Janith Lima, MD and is actively engaged with the care management team. I reached out to [name] by phone today tUnsuccessful telephone outreach attempt made. A HIPAA compliant phone message was left for the patient providing contact information and requesting a return call. o assist with re-scheduling a follow up visit with the Licensed Clinical Social Worker ? ?Follow up plan: ?Unsuccessful telephone outreach attempt made. A HIPAA compliant phone message was left for the patient providing contact information and requesting a return call.  ?The care management team will reach out to the patient again over the next 7 days.  ?If patient returns call to provider office, please advise to call [place] at [phone]. ? ?Laverda Sorenson  ?Care Guide, Embedded Care Coordination ?[place]  Care Management  ?Direct Dial: [phone] ? ?

## 2022-01-31 DIAGNOSIS — I10 Essential (primary) hypertension: Secondary | ICD-10-CM

## 2022-01-31 DIAGNOSIS — E118 Type 2 diabetes mellitus with unspecified complications: Secondary | ICD-10-CM | POA: Diagnosis not present

## 2022-01-31 DIAGNOSIS — Z794 Long term (current) use of insulin: Secondary | ICD-10-CM

## 2022-02-03 NOTE — Chronic Care Management (AMB) (Signed)
?  Chronic Care Management ?Note ? ?02/03/2022 ?Name: KYUSS HALE MRN: [id number] DOB: 1945/11/28 ? ?HEZEKIAH VELTRE is a 76 y.o. year old male who is a primary care patient of Janith Lima, MD and is actively engaged with the care management team. I reached out to [name] by phone today to assist with re-scheduling a follow up visit with the Licensed Clinical Social Worker ? ?Follow up plan: ?Telephone appointment with care management team member scheduled for:02/09/22 ? ?Laverda Sorenson  ?Care Guide, Embedded Care Coordination ?[place]  Care Management  ?Direct Dial: [phone] ? ?

## 2022-02-04 ENCOUNTER — Other Ambulatory Visit: Payer: Self-pay | Admitting: Internal Medicine

## 2022-02-04 DIAGNOSIS — E1165 Type 2 diabetes mellitus with hyperglycemia: Secondary | ICD-10-CM

## 2022-02-04 DIAGNOSIS — E118 Type 2 diabetes mellitus with unspecified complications: Secondary | ICD-10-CM

## 2022-02-08 ENCOUNTER — Telehealth: Payer: Self-pay

## 2022-02-08 NOTE — Progress Notes (Signed)
? ? ?Chronic Care Management ?Pharmacy Assistant  ? ?Name: Charles Parker  MRN: [id number] DOB: Sep 28, 1946 ? ? ?Reason for Encounter: Disease State-General ?  ? ?Recent office visits:  ?None since the last coordination call on 01/13/22 ? ?Recent consult visits:  ?None since the last coordination call ? ?Hospital visits:  ?None since the last coordination call ? ?Medications: ?Outpatient Encounter Medications as of 02/08/2022  ?Medication Sig  ? Alcohol Swabs PADS Use to clean area before insulin injections. DX E11.8  ? blood glucose meter kit and supplies KIT 1 each by Does not apply route daily as needed. Accu-Chek Aviva preferred by patient. Use up to four times daily as directed. (FOR ICD-9 250.00, 250.01).  ? Cholecalciferol 50 MCG (2000 UT) TABS Take 1 tablet (2,000 Units total) by mouth daily.  ? Continuous Blood Gluc Receiver (FREESTYLE LIBRE 2 READER) DEVI 1 Act by Does not apply route daily.  ? Continuous Blood Gluc Sensor (FREESTYLE LIBRE 2 SENSOR) MISC 1 Act by Does not apply route daily.  ? dapagliflozin propanediol (FARXIGA) 10 MG TABS tablet Take 1 tablet (10 mg total) by mouth daily.  ? gabapentin (NEURONTIN) 300 MG capsule TAKE 1 CAPSULE(300 MG) BY MOUTH TWICE DAILY (Patient taking differently: Take 300 mg by mouth daily. TAKE 1 CAPSULE(300 MG) BY MOUTH TWICE DAILY)  ? glimepiride (AMARYL) 4 MG tablet TAKE 1 TABLET BY MOUTH DAILY  BEFORE BREAKFAST  ? Glucagon (GVOKE HYPOPEN 2-PACK) 1 MG/0.2ML SOAJ Inject 1 Act into the skin daily as needed.  ? glucose blood test strip Use Accu Chek Aviva Test Strips as instructed to check blood sugar three times daily.  ? Insulin Lispro Prot & Lispro (HUMALOG MIX 75/25 KWIKPEN) (75-25) 100 UNIT/ML Kwikpen Inject 36 Units into the skin in the morning and at bedtime.  ? Insulin Pen Needle (B-D UF III MINI PEN NEEDLES) 31G X 5 MM MISC Inject 1 Act into the skin 2 (two) times daily. ADMINISTER 30 UNITS EVERY EVENING  ? ketoconazole (NIZORAL) 2 % cream Apply 1 application.  topically 2 (two) times daily.  ? losartan (COZAAR) 25 MG tablet Take 1 tablet (25 mg total) by mouth daily.  ? pioglitazone (ACTOS) 45 MG tablet Take 1 tablet (45 mg total) by mouth daily.  ? simvastatin (ZOCOR) 40 MG tablet TAKE 1 TABLET BY MOUTH AT  BEDTIME  ? solifenacin (VESICARE) 10 MG tablet Take 1 tablet (10 mg total) by mouth daily.  ? XARELTO 10 MG TABS tablet TAKE 1 TABLET BY MOUTH DAILY  ? ?No facility-administered encounter medications on file as of 02/08/2022.  ? ?3 attempts made to contacted [name] for General Review Call. Unable to reach,left messages for patient to return call ? ? ?Chart Review: ? ?Have there been any documented new, changed, or discontinued medications since last visit? No (If yes, include name, dose, frequency, date) ?Has there been any documented recent hospitalizations or ED visits since last visit with Clinical Pharmacist? No ?Brief Summary (including medication and/or Diagnosis changes): ? ? ?Adherence Review: ? ?Does the Clinical Pharmacist Assistant have access to adherence rates? Yes ?Adherence rates for STAR metric medications (List medication(s)/day supply/ last 2 fill dates). ?Adherence rates for medications indicated for disease state being reviewed (List medication(s)/day supply/ last 2 fill dates). ?Does the patient have >5 day gap between last estimated fill dates for any of the above medications or other medication gaps? No ?Reason for medication gaps. ? ? ?14. Next visit Type: telephone ?  Visit with:Clinical Pharmacist ?       Date:05/04/22 ?       Time:9am ? ?   ?Care Gaps: ?Colonoscopy-11/23/18 ?Diabetic Foot Exam-07/09/20 ?Ophthalmology-NA ?Dexa Scan - Na ?Annual Well Visit - NA ?Micro albumin-11/07/20 ?Hemoglobin A1c- 12/14/21 ?  ?Star Rating Drugs: ?Losartan 25 mg-last fill 10/12/21 90 ds ?Farxiga 10 mg-last fill 09/21/21 90 ds ?Simvastatin 40 mg-last fill 01/26/22 90 ds ?Glimepiride 4 mg-last fill 02/05/22 90 ds ?  ?Ethelene Hal ?Clinical Pharmacist  Assistant ?[phone]  ? ?

## 2022-02-09 ENCOUNTER — Ambulatory Visit (INDEPENDENT_AMBULATORY_CARE_PROVIDER_SITE_OTHER): Payer: Medicare Other | Admitting: Licensed Clinical Social Worker

## 2022-02-09 ENCOUNTER — Encounter: Payer: Self-pay | Admitting: Licensed Clinical Social Worker

## 2022-02-09 DIAGNOSIS — Z7189 Other specified counseling: Secondary | ICD-10-CM

## 2022-02-09 DIAGNOSIS — I10 Essential (primary) hypertension: Secondary | ICD-10-CM

## 2022-02-09 DIAGNOSIS — Z794 Long term (current) use of insulin: Secondary | ICD-10-CM

## 2022-02-09 NOTE — Chronic Care Management (AMB) (Signed)
?Chronic Care Management  ? Clinical Social Work Note ? ?02/09/2022 ?Name: Charles Parker MRN: [id number] DOB: 1946-05-15 ? ?Charles Parker is a 76 y.o. year old male who is a primary care patient of Janith Lima, MD. The CCM team was consulted to assist the patient with chronic disease management and/or care coordination needs related to: [employer] representative.  ? ?Engaged with patient by telephone for follow up visit in response to provider referral for social work chronic care management and care coordination services.  ? ?Consent to Services:  ?The patient was given information about Chronic Care Management services, agreed to services, and gave verbal consent prior to initiation of services.  Please see initial visit note for detailed documentation.  ? ?Patient agreed to services and consent obtained.  ? ?Summary:  Patient is making progress with his advance directive, time was spent  during this encounter reviewing document, assisting with completing it and helping him process his desires .  See Care Plan below for interventions and patient self-care actives. ? ?Recommendation: Patient may benefit from, and is in agreement to review advance directive document with family. have it notarized and provide copy for chart.  ? ?Follow up Plan: Patient would like continued follow-up from CCM LCSW.  per patient's request will follow up in 45 days.  Will call office if needed prior to next encounter. ?  ?Assessment: Review of patient past medical history, allergies, medications, and health status, including review of relevant consultants reports was performed today as part of a comprehensive evaluation and provision of chronic care management and care coordination services.    ? ?SDOH (Social Determinants of Health) assessments and interventions performed:   ? ?Advanced Directives Status: See Care Plan for related entries. ? ?CCM Care Plan ?Conditions to be addressed/monitored: ; chronic conditions ? ?Care  Plan : LCSW Plan of Care  ?Updates made by Maurine Cane, LCSW since 02/09/2022 12:00 AM  ?  ? ?Problem: Long-Term Care Planning   ?  ? ?Goal: Effective Long-Term Care Planning with Advance Directives   ?Start Date: 12/22/2021  ?This Visit's Progress: On track  ?Recent Progress: On track  ?Priority: High  ?Note:   ?Current Barriers:  ?No Advanced Directives in place ? ?CSW Clinical Goal(s):  ?patient will work with LCSW and family to address needs related to no advance directive  through collaboration with [employer] representative, provider, and care team.  ? ?Interventions: ?1:1 collaboration with primary care provider regarding development and update of comprehensive plan of care as evidenced by provider attestation and co-signature ?Inter-disciplinary care team collaboration (see longitudinal plan of care) ?Evaluation of current treatment plan related to  self management and patient's adherence to plan as established by provider ?Review resources, discussed options and provided patient information about  ?Department of Social Services ( has food stamps and medicaid ) ?Enhanced Benefits connected with insurance provider:(has benefit card will activate it this week) ?Advance Directive Education (provided) ? ?Level of Care Concerns in a patient with HTN and DMII:  (Status: Goal on Track (progressing): YES.) ?Current level of care: home, alone ?Evaluation of patient safety in current living environment ?Active listening / Reflection utilized  ?Emotional Support Provided ?Assessed understanding of Advance Directives. , A voluntary discussion about advanced care planning including importance of advanced directives, healthcare proxy and living will was discussed., Advance directive packet mailed  , and assisted patient with completing document. ? ?Task & activities to accomplish goals: ?Review document we completed today ?Have advance directive  notarized and provide a copy to provider office    ?  ?  ?Casimer Lanius,  LCSW ?Licensed Clinical Social Worker [facility]  ?[place]  ?[phone]  ? ? ?

## 2022-02-09 NOTE — Patient Instructions (Signed)
Visit Information ? ?Thank you for taking time to visit with me today. Please don't hesitate to contact me if I can be of assistance to you before our next scheduled telephone appointment. ? ?Following are the goals we discussed today:  ?Task & activities to accomplish goals: ?Review document we completed today ?Have advance directive notarized and provide a copy to provider office    ?   ?  ?Casimer Lanius, LCSW ?Licensed Clinical Social Worker [facility]  ?[place]  ?[phone]  ? ?Our next appointment is by telephone on July 3rd at 10:30 ? ?Please call the care guide team at [phone] if you need to cancel or reschedule your appointment.  ? ?If you are experiencing a Mental Health or [place] or need someone to talk to, please call 1-800-273-TALK (toll free, 24 hour hotline)  ? ?The patient verbalized understanding of instructions, educational materials, and care plan provided today and agreed to receive a mailed copy of patient instructions, educational materials, and care plan.  ?

## 2022-02-15 ENCOUNTER — Ambulatory Visit: Payer: Self-pay | Admitting: Licensed Clinical Social Worker

## 2022-02-15 DIAGNOSIS — I10 Essential (primary) hypertension: Secondary | ICD-10-CM

## 2022-02-15 DIAGNOSIS — Z7189 Other specified counseling: Secondary | ICD-10-CM

## 2022-02-15 DIAGNOSIS — Z794 Long term (current) use of insulin: Secondary | ICD-10-CM

## 2022-02-15 NOTE — Chronic Care Management (AMB) (Signed)
?Chronic Care Management  ? Clinical Social Work Note ? ?02/15/2022 ?Name: Charles Parker MRN: [id number] DOB: 02/17/1946 ? ?Charles Parker is a 76 y.o. year old male who is a primary care patient of Janith Lima, MD. The CCM team was consulted to assist the patient with chronic disease management and/or care coordination needs related to: [employer] representative.  ? ?Engaged with patient by telephone for follow up visit in response to provider referral for social work chronic care management and care coordination services.  ? ?Consent to Services:  ?The patient was given information about Chronic Care Management services, agreed to services, and gave verbal consent prior to initiation of services.  Please see initial visit note for detailed documentation.  ? ?Patient agreed to services and consent obtained.  ? ?Summary:  Patient is making progress with advance directive, reports he received it in the mail and will take it to have noterized .  See Care Plan below for interventions and patient self-care actives. ? ?Follow up Plan: Patient would like continued follow-up from CCM LCSW.  per patient's request will follow up in 60 days.  Will call office if needed prior to next encounter. ?  ?Assessment: Review of patient past medical history, allergies, medications, and health status, including review of relevant consultants reports was performed today as part of a comprehensive evaluation and provision of chronic care management and care coordination services.    ? ?SDOH (Social Determinants of Health) assessments and interventions performed:   ? ?Advanced Directives Status: See Care Plan for related entries. ? ?CCM Care Plan ?Conditions to be addressed/monitored: ; Level of care concerns ? ?Care Plan : LCSW Plan of Care  ?Updates made by Maurine Cane, LCSW since 02/15/2022 12:00 AM  ?  ? ?Problem: Long-Term Care Planning   ?  ? ?Goal: Effective Long-Term Care Planning with Advance Directives   ?Start  Date: 12/22/2021  ?This Visit's Progress: On track  ?Recent Progress: On track  ?Priority: High  ?Note:   ?Current Barriers:  ?No Advanced Directives in place ? ?CSW Clinical Goal(s):  ?patient will work with LCSW and family to address needs related to no advance directive  through collaboration with [employer] representative, provider, and care team.  ? ?Interventions: ?1:1 collaboration with primary care provider regarding development and update of comprehensive plan of care as evidenced by provider attestation and co-signature ?Inter-disciplinary care team collaboration (see longitudinal plan of care) ?Evaluation of current treatment plan related to  self management and patient's adherence to plan as established by provider ?Review resources, discussed options and provided patient information about  ?Department of Social Services ( has food stamps and medicaid ) ?Enhanced Benefits connected with insurance provider:(has benefit card will activate it this week) ?Advance Directive Education (provided) ? ?Level of Care Concerns in a patient with HTN and DMII:  (Status: Goal on Track (progressing): YES.) ?Current level of care: home, alone ?Evaluation of patient safety in current living environment ?Active listening / Reflection utilized  ?Emotional Support Provided ?Assessed understanding of Advance Directives. , A voluntary discussion about advanced care planning including importance of advanced directives, healthcare proxy and living will was discussed., Advance directive packet mailed  , and assisted patient with completing document. ? ?Task & activities to accomplish goals: ?Review document we completed   ?Have advance directive notarized and provide a copy to provider office    ?  ?  ?Casimer Lanius, LCSW ?Licensed Clinical Social Worker [facility]  ?[place]  ?[phone]  ? ? ?

## 2022-02-15 NOTE — Patient Instructions (Signed)
Visit Information ? ?Thank you for taking time to visit with me today. Please don't hesitate to contact me if I can be of assistance to you before our next scheduled telephone appointment. ? ?Following are the goals we discussed today:  ?Task & activities to accomplish goals: ?Review document we completed   ?Have advance directive notarized and provide a copy to provider office    ?   ? ?Casimer Lanius, LCSW ?Licensed Clinical Social Worker [facility]  ?[place]  ?[phone]  ? ?Our next appointment is by telephone on July 3rd  ? ?Please call the care guide team at [phone] if you need to cancel or reschedule your appointment.  ? ?If you are experiencing a Mental Health or [place] or need someone to talk to, please call 1-800-273-TALK (toll free, 24 hour hotline)  ? ?Patient verbalizes understanding of instructions and care plan provided today and agrees to view in [place]. Active MyChart status confirmed with patient.   ? ? ?

## 2022-02-23 ENCOUNTER — Other Ambulatory Visit (INDEPENDENT_AMBULATORY_CARE_PROVIDER_SITE_OTHER): Payer: Medicare Other

## 2022-02-23 DIAGNOSIS — Z794 Long term (current) use of insulin: Secondary | ICD-10-CM | POA: Diagnosis not present

## 2022-02-23 DIAGNOSIS — E1165 Type 2 diabetes mellitus with hyperglycemia: Secondary | ICD-10-CM

## 2022-02-23 LAB — BASIC METABOLIC PANEL
BUN: 18 mg/dL (ref 6–23)
CO2: 27 mEq/L (ref 19–32)
Calcium: 9.9 mg/dL (ref 8.4–10.5)
Chloride: 101 mEq/L (ref 96–112)
Creatinine, Ser: 1.17 mg/dL (ref 0.40–1.50)
GFR: 60.78 mL/min (ref >60.00)
Glucose, Bld: 309 mg/dL — ABNORMAL HIGH (ref 70–99)
Potassium: 4.7 mEq/L (ref 3.5–5.1)
Sodium: 135 mEq/L (ref 135–145)

## 2022-02-23 LAB — MICROALBUMIN / CREATININE URINE RATIO
Creatinine,U: 125.6 mg/dL
Microalb Creat Ratio: 0.6 mg/g (ref 0.0–30.0)
Microalb, Ur: 0.7 mg/dL (ref 0.0–1.9)

## 2022-02-24 LAB — FRUCTOSAMINE: Fructosamine: 369 umol/L — ABNORMAL HIGH (ref 0–285)

## 2022-02-25 ENCOUNTER — Encounter: Payer: Self-pay | Admitting: Endocrinology

## 2022-02-25 ENCOUNTER — Ambulatory Visit (INDEPENDENT_AMBULATORY_CARE_PROVIDER_SITE_OTHER): Payer: Medicare Other | Admitting: Endocrinology

## 2022-02-25 VITALS — BP 142/82 | HR 86 | Ht 71.5 in | Wt 193.0 lb

## 2022-02-25 DIAGNOSIS — Z794 Long term (current) use of insulin: Secondary | ICD-10-CM

## 2022-02-25 DIAGNOSIS — E1165 Type 2 diabetes mellitus with hyperglycemia: Secondary | ICD-10-CM | POA: Diagnosis not present

## 2022-02-25 NOTE — Progress Notes (Signed)
Patient ID: Charles Parker, male   DOB: 1946/04/18, 76 y.o.   MRN: [id number]          Reason for Appointment: Follow-up for Type 2 Diabetes  Referring physician: Scarlette Calico   History of Present Illness:          Date of diagnosis of type 2 diabetes mellitus: 2014        Background history:   He has been on various oral hypoglycemic drugs since the onset including metformin, Januvia and Amaryl Was also on Invokana in 2015 for about a year but not clear why this was stopped His level of control has been quite variable with highest A1c 13.6 in 06/2015  He has been on insulin since about 10/2014 as documented in his record  Recent history:   INSULIN regimen is:  NovoLog mix 40 units twice daily  Non-insulin hypoglycemic drugs the patient is taking [id number] 10 mg daily  His A1c is 10.6 compared to 11.2  Fructosamine is 291, previously at 417    Current management, blood sugar patterns and problems identified: He was keeping a record of his insulin doses and glucose time after his last visit and overall appeared to have much better blood sugars  However again in the last 2 or 3 weeks he has been preoccupied with family matters and has not been checking his blood sugars are taking his insulin consistently His lab glucose was 309  As before his recall for his insulin doses as before and he thinks he is only taking 30 units in the evening instead of 40 Previously in the hypoglycemia Recently has only a couple of readings, range 101-132 but generally before breakfast or lunch  Only remembers to check readings after meals and was checking only before breakfast and dinner when he takes insulin Diet has been variable His weight has gone up again  As before he is taking his [place] regularly   Side effects from medications have been: Hives from metformin  Compliance with the medical regimen: Inconsistent  Glucose monitoring:  done< 1 times a   day usually        Glucometer:  Accu-Chek guide       Blood Glucose readings as above    Self-care: The diet that the patient has been following is: tries to limit [employer] .      Typical meal intake: Breakfast is eggs/cereal or oatmeal usually, lunch:  Lean cuisine or sandwiches.  Dinner chicken and vegetables.   Snacks will be chips, peanut butter crackers Supper at 6 pm                Dietician visit, most recent:3/19               Weight history:  Wt Readings from Last 3 Encounters:  02/25/22 193 lb (87.5 kg)  12/17/21 186 lb (84.4 kg)  12/17/21 186 lb 12.8 oz (84.7 kg)    Glycemic control:   Lab Results  Component Value Date   HGBA1C 10.6 (H) 12/14/2021   HGBA1C 10.3 (H) 11/05/2021   HGBA1C 11.2 Repeated and verified X2. (H) 08/18/2021   Lab Results  Component Value Date   MICROALBUR 0.7 02/23/2022   LDLCALC 111 (H) 10/09/2021   CREATININE 1.17 02/23/2022   Lab Results  Component Value Date   MICRALBCREAT 0.6 02/23/2022    Lab Results  Component Value Date   FRUCTOSAMINE 369 (H) 02/23/2022   FRUCTOSAMINE 417 (H) 10/09/2021   FRUCTOSAMINE 546 (  H) 06/25/2021      Allergies as of 02/25/2022       Reactions   Metformin And Related Diarrhea   Codeine Rash   All over the body        Medication List        Accurate as of Feb 25, 2022  1:54 PM. If you have any questions, ask your nurse or doctor.          Alcohol Swabs Pads Use to clean area before insulin injections. DX E11.8   B-D UF III MINI PEN NEEDLES 31G X 5 MM Misc Generic drug: Insulin Pen Needle Inject 1 Act into the skin 2 (two) times daily. ADMINISTER 30 UNITS EVERY EVENING   blood glucose meter kit and supplies Kit 1 each by Does not apply route daily as needed. Accu-Chek Aviva preferred by patient. Use up to four times daily as directed. (FOR ICD-9 250.00, 250.01).   Cholecalciferol 50 MCG (2000 UT) Tabs Take 1 tablet (2,000 Units total) by mouth daily.   dapagliflozin propanediol 10 MG Tabs  tablet Commonly known as: Farxiga Take 1 tablet (10 mg total) by mouth daily.   FreeStyle Libre 2 Reader [place] 1 Act by Does not apply route daily.   FreeStyle Libre 2 Sensor Misc 1 Act by Does not apply route daily.   gabapentin 300 MG capsule Commonly known as: NEURONTIN TAKE 1 CAPSULE(300 MG) BY MOUTH TWICE DAILY What changed:  how much to take how to take this when to take this   glimepiride 4 MG tablet Commonly known as: AMARYL TAKE 1 TABLET BY MOUTH DAILY  BEFORE BREAKFAST   glucose blood test strip Use Accu Chek Aviva Test Strips as instructed to check blood sugar three times daily.   Gvoke HypoPen 2-Pack 1 MG/0.2ML Soaj Generic drug: Glucagon Inject 1 Act into the skin daily as needed.   Insulin Lispro Prot & Lispro (75-25) 100 UNIT/ML Kwikpen Commonly known as: HumaLOG Mix 75/25 KwikPen Inject 36 Units into the skin in the morning and at bedtime. What changed: additional instructions   ketoconazole 2 % cream Commonly known as: NIZORAL Apply 1 application. topically 2 (two) times daily.   losartan 25 MG tablet Commonly known as: COZAAR Take 1 tablet (25 mg total) by mouth daily.   pioglitazone 45 MG tablet Commonly known as: Actos Take 1 tablet (45 mg total) by mouth daily.   simvastatin 40 MG tablet Commonly known as: ZOCOR TAKE 1 TABLET BY MOUTH AT  BEDTIME   solifenacin 10 MG tablet Commonly known as: VESICARE Take 1 tablet (10 mg total) by mouth daily.   Xarelto 10 MG Tabs tablet Generic drug: rivaroxaban TAKE 1 TABLET BY MOUTH DAILY        Allergies:  Allergies  Allergen Reactions   Metformin And Related Diarrhea   Codeine Rash    All over the body    Past Medical History:  Diagnosis Date   Clotting disorder ([place])    DVT   Diabetes mellitus    type II   Diverticulosis    DVT (deep venous thrombosis) ([facility])    Glaucoma    no eye drops    Hepatic cyst    Hypercholesterolemia    Hypertension    Renal cyst    Sleep apnea     Tubulovillous adenoma     Past Surgical History:  Procedure Laterality Date   BLADDER NECK RECONSTRUCTION  02/03/2012   Procedure: BLADDER NECK REPAIR;  Surgeon: Adin Hector,  MD;  Location: [facility];  Service: General;;   COLONOSCOPY  02/2013   polyp removal(mult.)   KNEE SURGERY  2004   left   POLYPECTOMY     PROCTOSCOPY  02/03/2012   Procedure: PROCTOSCOPY;  Surgeon: Adin Hector, MD;  Location: [facility];  Service: General;;   STOMACH SURGERY  02/2012   TEE WITHOUT CARDIOVERSION N/A 10/05/2016   Procedure: TRANSESOPHAGEAL ECHOCARDIOGRAM (TEE);  Surgeon: Sanda Klein, MD;  Location[facility];  Service: Cardiovascular;  Laterality: N/A;    Family History  Problem Relation Age of Onset   Hypertension Mother    Diabetes Mother    Cancer Mother        ? location   Colon cancer Father 60   Colon polyps Neg Hx    Liver cancer Neg Hx     Social History:  reports that he has never smoked. He has never used smokeless tobacco. He reports that he does not drink alcohol and does not use drugs.   Review of Systems  The following is a copy of previous note  Lipid history: Has been taking simvastatin 40 mg with variable control, followed by PCP He has variable compliance with his medication likely   Lab Results  Component Value Date   CHOL 194 10/09/2021   CHOL 153 08/19/2020   CHOL 202 (H) 05/12/2020   Lab Results  Component Value Date   HDL 52.10 10/09/2021   HDL 43.00 08/19/2020   HDL 50.80 05/12/2020   Lab Results  Component Value Date   LDLCALC 111 (H) 10/09/2021   LDLCALC 84 08/19/2020   LDLCALC 124 (H) 05/12/2020   Lab Results  Component Value Date   TRIG 153.0 (H) 10/09/2021   TRIG 130.0 08/19/2020   TRIG 134.0 05/12/2020   Lab Results  Component Value Date   CHOLHDL 4 10/09/2021   CHOLHDL 4 08/19/2020   CHOLHDL 4 05/12/2020   Lab Results  Component Value Date   LDLDIRECT 115.0 07/02/2019   LDLDIRECT 118.0 11/11/2015   LDLDIRECT 201.0 03/04/2015            He has history of neuropathy, not symptomatic and not taking gabapentin prescribed by PCP  Most recent foot exam: 2/22  RENAL function is consistently normal with continuing Farxiga 10 mg  Lab Results  Component Value Date   CREATININE 1.17 02/23/2022   CREATININE 1.18 12/14/2021   CREATININE 1.17 11/05/2021    Hypertension: On losartan 25 mg prescribed by PCP  BP Readings from Last 3 Encounters:  02/25/22 (!) 142/82  12/17/21 124/70  12/17/21 136/84    Has had all 3 Covid shots  May have diabetic shoes for his foot deformities    Physical Examination:  BP (!) 142/82   Pulse 86   Ht 5' 11.5" (1.816 m)   Wt 193 lb (87.5 kg)   SpO2 98%   BMI 26.54 kg/m       ASSESSMENT:  Diabetes type 2 on insulin  See history of present illness for detailed discussion of current diabetes management, blood sugar patterns and problems identified   He is on a regimen of premixed insulin twice a day and Farxiga  His A1c is last over 10% Fructosamine is 369 which is better than usual  He has persistently poor control  Although recently has been inconsistent with his insulin and monitoring the great batch better when he was keeping a record of his insulin doses and glucose on the flowsheet given  Foot exam: Despite  poorly controlled diabetes he does not have any evidence of any neuropathic changes  PLAN:    He was given log sheets for insulin and glucose charting and he will start recording his doses as well as glucose both morning and evening Reminded him to check his insulin a few minutes before starting to eat Avoid high carbohydrate and high fat foods Recommended he start doing more walking For now we will continue the same doses  Again consider using freestyle libre although it unlikely he can manage it himself   There are no Patient Instructions on file for this visit.       Elayne Snare 02/25/2022, 1:54 PM   Note: This office note was prepared  with Dragon voice recognition system technology. Any transcriptional errors that result from this process are unintentional.

## 2022-02-25 NOTE — Patient Instructions (Signed)
Take 40 units insulin before BFst and supper  Check sugar 2x daily

## 2022-03-03 DIAGNOSIS — Z794 Long term (current) use of insulin: Secondary | ICD-10-CM

## 2022-03-03 DIAGNOSIS — E118 Type 2 diabetes mellitus with unspecified complications: Secondary | ICD-10-CM

## 2022-03-03 DIAGNOSIS — I10 Essential (primary) hypertension: Secondary | ICD-10-CM

## 2022-03-08 ENCOUNTER — Ambulatory Visit (INDEPENDENT_AMBULATORY_CARE_PROVIDER_SITE_OTHER): Payer: Medicare Other | Admitting: *Deleted

## 2022-03-08 DIAGNOSIS — I10 Essential (primary) hypertension: Secondary | ICD-10-CM

## 2022-03-08 DIAGNOSIS — Z794 Long term (current) use of insulin: Secondary | ICD-10-CM

## 2022-03-08 NOTE — Patient Instructions (Signed)
Visit Information  Tyrion, "AD," thank you for taking time to talk with me today. Please don't hesitate to contact me if I can be of assistance to you before our next scheduled telephone appointment  Below are the goals we discussed today:  Patient Self-Care Activities: Patient Charles Parker  will: Take medications as prescribed Attend all scheduled provider appointments Call pharmacy for medication refills Call provider office for new concerns or questions Continue to check fasting (first thing in the morning, before eating) blood sugars at home; check your blood sugar again later in the day, 2 hours after eating a regular sized meal Write down your blood sugars at home so we can review these together when we talk over the phone next: the blood sugars we reviewed today are all in good range Continue to follow heart healthy, low salt, low cholesterol, carbohydrate-modified, low sugar diet Continue staying as active as you are able: do not over-do activity in the coming warmer weather Keep up the great work preventing falls-- continue to use your cane as/ if needed  Our next scheduled telephone follow up visit/ appointment is scheduled on:  Tuesday, May 18, 2022 at 10:00 am- This is a PHONE [place] appointment  If you need to cancel or re-schedule our visit, please call [phone] and our care guide team will be happy to assist you.   I look forward to hearing about your progress.   Oneta Rack, RN, BSN, [place] [phone]: direct office  If you are experiencing a Mental Health or [place] or need someone to talk to, please  call the Suicide and Crisis Lifeline: 988 call the [place] National Suicide Prevention Lifeline: (980)635-1707 or TTY: 231-278-0614 TTY (321)597-2609) to talk to a trained counselor call 1-800-273-TALK (toll free, 24 hour hotline) go to [facility] [address] [place] [phone]) call 911   The patient verbalized understanding of instructions, educational materials, and care plan provided today and agreed to receive a mailed copy of patient instructions, educational materials, and care plan   Exercise Information for Aging Adults Staying physically active is important as you age. Physical activity and exercise can help in maintaining quality of life, health, physical function, and reducing falls. The four types of exercises that are best for older adults are endurance, strength, balance, and flexibility. Contact your health care provider before you start any exercise routine. Ask your health care provider what activities are safe for you. What are the risks? Risks associated with exercising include: Overdoing it. This may lead to sore muscles or fatigue. Falls. Injuries. Dehydration. How to do these exercises Endurance exercises Endurance (aerobic) exercises raise your breathing rate and heart rate. Increasing your endurance helps you do everyday tasks and stay healthy. By improving the health of your body system that includes your heart, lungs, and blood vessels (circulatory system), you may also delay or prevent diseases such as heart disease, diabetes, and weak bones (osteoporosis). Types of endurance exercises include: Sports. Indoor activities, such as using gym equipment, doing water aerobics, or dancing. Outdoor activities, such as biking or jogging. Tasks around the house, such as gardening, yard work, and heavy household chores like cleaning. Walking, such as hiking or walking around your neighborhood. When doing endurance exercises, make sure you: Are aware of your surroundings. Use safety equipment as directed. Dress in layers when exercising outdoors. Drink plenty of water to stay well hydrated. Build up endurance slowly.  Start with 10 minutes at a time, and gradually build up to doing 30 minutes at a time. Unless  your health care provider gave you different instructions, aim to exercise for a total of 150 minutes a week. Spread out that time so you are working on endurance 3 or more days a week. Strength exercises Lifting, pulling, or pushing weights helps to strengthen muscles. Having stronger muscles makes it easier to do everyday activities, such as getting up from a chair, climbing stairs, carrying groceries, and playing with grandchildren. Strength exercises include arm and leg exercises that may be done: With weights. Without weights (using your own body weight). With a resistance band. When doing strength exercises: Move smoothly and steadily. Do not suddenly thrust or jerk the weights, the resistance band, or your body. Start with no weights or with light weights, and gradually add more weight over time. Eventually, aim to use weights that are hard or very hard for you to lift. This means that you are able to do 8 repetitions with the weight, and the last few repetitions are very challenging. Lift or push weights into position for 3 seconds, hold the position for 1 second, and then take 3 seconds to return to your starting position. Breathe out (exhale) during difficult movements, like lifting or pushing weights. Breathe in (inhale) to relax your muscles before the next repetition. Consider alternating arms or legs, especially when you first start strength exercises. Expect some slight muscle soreness after each session. Do strength exercises on 2 or more days a week, for 30 minutes at a time. Avoid exercising the same muscle groups two days in a row. For example, if you work on your leg muscles one day, work on your arm muscles the next day. When you can do two sets of 10-15 repetitions with a certain weight, increase the amount of weight. Balance exercises Balance exercises can help to prevent falls. Balance exercises include: Standing on one foot. Heel-to-toe walk. Balance walk. Tai chi. Make  sure you have something sturdy to hold onto while doing balance exercises, such as a sturdy chair. As your balance improves, challenge yourself by holding on to the chair with one hand instead of two, and then with no hands. Trying exercises with your eyes closed also challenges your balance, but be sure to have a sturdy surface (like a countertop) close by in case you need it. Do balance exercises as often as you want, or as often as directed by your health care provider. Flexibility exercises  Flexibility exercises improve how far you can bend, straighten, move, or rotate parts of your body (range of motion). These exercises also help you do everyday activities such as getting dressed or reaching for objects. Flexibility exercises include stretching different parts of the body, and they may be done in a standing or seated position or on the floor. When stretching, make sure you: Keep a slight bend in your arms and legs. Avoid completely straightening ("locking") your joints. Do not stretch so far that you feel pain. You should feel a mild stretching feeling. You may try stretching farther as you become more flexible over time. Relax and breathe between stretches. Hold on to something sturdy for balance as needed. Hold each stretch for 10-30 seconds. Repeat each stretch 3-5 times. General safety tips Exercise in well-lit areas. Do not hold your breath during exercises or stretches. Warm up before exercising, and cool down after exercising. This can help prevent injury. Drink plenty of water during  exercise or any activity that makes you sweat. If you are not sure if an exercise is safe for you, or you are not sure how to do an exercise, talk with your health care provider. This is especially important if you have had surgery on muscles, bones, or joints (orthopedic surgery). Where to find more information You can find more information about exercise for older adults from: Your local health  department, fitness center, or community center. These facilities may have programs for aging adults. [employer] on Aging: http://kim-miller.com/ National Council on Aging: www.ncoa.org Summary Staying physically active is important as you age. Doing endurance, strength, balance, and flexibility exercises can help in maintaining quality of life, health, physical function, and reducing falls. Make sure to contact your health care provider before you start any exercise routine. Ask your health care provider what activities are safe for you. This information is not intended to replace advice given to you by your health care provider. Make sure you discuss any questions you have with your health care provider. Document Revised: 02/02/2021 Document Reviewed: 02/02/2021 Elsevier Patient Education  [place].

## 2022-03-08 NOTE — Chronic Care Management (AMB) (Signed)
Chronic Care Management   CCM RN Visit Note  03/08/2022 Name: Charles Parker MRN: [id number] DOB: Jan 08, 1946  Subjective: Charles Parker is a 76 y.o. year old male who is a primary care patient of Elayne Snare, MD. The care management team was consulted for assistance with disease management and care coordination needs.    Engaged with patient by telephone for follow up visit in response to provider referral for case management and/or care coordination services.   Consent to Services:  The patient was given information about Chronic Care Management services, agreed to services, and gave verbal consent prior to initiation of services.  Please see initial visit note for detailed documentation.  Patient agreed to services and verbal consent obtained.   Assessment: Review of patient past medical history, allergies, medications, health status, including review of consultants reports, laboratory and other test data, was performed as part of comprehensive evaluation and provision of chronic care management services.   SDOH (Social Determinants of Health) assessments and interventions performed:  SDOH Interventions    Flowsheet Row Most Recent Value  SDOH Interventions   Housing Interventions Intervention Not Indicated  [continues to reside alone in senior apartment complex,  denies housing concerns]  Transportation Interventions Intervention Not Indicated  [continues to drive self]     CCM Care Plan  Allergies  Allergen Reactions   Metformin And Related Diarrhea   Codeine Rash    All over the body   Outpatient Encounter Medications as of 03/08/2022  Medication Sig   Alcohol Swabs PADS Use to clean area before insulin injections. DX E11.8   blood glucose meter kit and supplies KIT 1 each by Does not apply route daily as needed. Accu-Chek Aviva preferred by patient. Use up to four times daily as directed. (FOR ICD-9 250.00, 250.01).   Cholecalciferol 50 MCG (2000 UT) TABS Take 1 tablet  (2,000 Units total) by mouth daily.   Continuous Blood Gluc Receiver (FREESTYLE LIBRE 2 READER) DEVI 1 Act by Does not apply route daily.   Continuous Blood Gluc Sensor (FREESTYLE LIBRE 2 SENSOR) MISC 1 Act by Does not apply route daily.   dapagliflozin propanediol (FARXIGA) 10 MG TABS tablet Take 1 tablet (10 mg total) by mouth daily.   gabapentin (NEURONTIN) 300 MG capsule TAKE 1 CAPSULE(300 MG) BY MOUTH TWICE DAILY (Patient taking differently: Take 300 mg by mouth daily. TAKE 1 CAPSULE(300 MG) BY MOUTH TWICE DAILY)   glimepiride (AMARYL) 4 MG tablet TAKE 1 TABLET BY MOUTH DAILY  BEFORE BREAKFAST   Glucagon (GVOKE HYPOPEN 2-PACK) 1 MG/0.2ML SOAJ Inject 1 Act into the skin daily as needed.   glucose blood test strip Use Accu Chek Aviva Test Strips as instructed to check blood sugar three times daily.   Insulin Lispro Prot & Lispro (HUMALOG MIX 75/25 KWIKPEN) (75-25) 100 UNIT/ML Kwikpen Inject 36 Units into the skin in the morning and at bedtime. (Patient taking differently: Inject 36 Units into the skin in the morning and at bedtime. 36 in morning and 30 at night)   Insulin Pen Needle (B-D UF III MINI PEN NEEDLES) 31G X 5 MM MISC Inject 1 Act into the skin 2 (two) times daily. ADMINISTER 30 UNITS EVERY EVENING   ketoconazole (NIZORAL) 2 % cream Apply 1 application. topically 2 (two) times daily.   losartan (COZAAR) 25 MG tablet Take 1 tablet (25 mg total) by mouth daily.   pioglitazone (ACTOS) 45 MG tablet Take 1 tablet (45 mg total) by mouth daily.   simvastatin (  ZOCOR) 40 MG tablet TAKE 1 TABLET BY MOUTH AT  BEDTIME   solifenacin (VESICARE) 10 MG tablet Take 1 tablet (10 mg total) by mouth daily.   XARELTO 10 MG TABS tablet TAKE 1 TABLET BY MOUTH DAILY   No facility-administered encounter medications on file as of 03/08/2022.   Patient Active Problem List   Diagnosis Date Noted   Urinary incontinence 11/05/2021   Encounter for general adult medical examination with abnormal findings 11/07/2020    Elevated factor VIII level 10/08/2019   Candidal balanitis 03/29/2019   Erectile dysfunction due to diabetes mellitus ([place]) 03/29/2019   Essential hypertension 05/18/2018   Vitamin D insufficiency 05/18/2018   Hallux rigidus, right foot 02/14/2017   Degenerative arthritis of left knee 01/11/2017   OAB (overactive bladder) 01/09/2017   Iron deficiency 12/16/2016   Type 2 diabetes mellitus with complication, with long-term current use of insulin ([place]) 12/15/2016   Hyperlipidemia with target LDL less than 70 10/21/2016   Thoracic spine fracture ([place]) 10/21/2016   Benign prostatic hyperplasia (BPH) with straining on urination 06/20/2013   Tubulovillous adenoma of colon with high grade dysplasia (rectosigmoid) s/p LAR with bladder repair 02/03/12. 12/27/2011   Conditions to be addressed/monitored:  HTN and DMII  Care Plan : RN Care Manager Plan of Care  Updates made by Knox Royalty, RN since 03/08/2022 12:00 AM     Problem: Chronic Disease Management Needs   Priority: High     Long-Range Goal: Ongoing adherence to established plan of care for long term chronic disease management   Start Date: 12/25/2021  Expected End Date: 12/26/2022  Priority: High  Note:   Current Barriers:  Chronic Disease Management support and education needs related to HTN and DMII Lives alone- senior apartments, first level; reports apartment management currently starting to improve apartments for safety updating Daughter lives in [place] and patient occasionally visits with his daughter  [name] Clinical Goal(s):  Patient will demonstrate improved health management independence as evidenced by adherence to plan of care for DMII and HTN        through collaboration with Consulting civil engineer, provider, and care team.   Interventions: 1:1 collaboration with primary care provider regarding development and update of comprehensive plan of care as evidenced by provider attestation and co-signature Inter-disciplinary  care team collaboration (see longitudinal plan of care) Evaluation of current treatment plan related to  self management and patient's adherence to plan as established by provider Review of patient status, including review of consultants reports, relevant laboratory and other test results, and medications completed SDOH updated: no new/ unmet concerns identified Pain assessment updated: continues to deny pain Falls assessment updated: continues to deny new/ recent falls x 12 months- currently reports not using assistive devices, has cane if needed;  positive reinforcement provided with encouragement to continue efforts at fall prevention; previously provided education around fall risks/ prevention reinforced Medications discussed: reports continues to independently self-manage and denies current concerns/ issues/ questions around medications; endorses adherence to taking all medications as prescribed Reviewed upcoming scheduled provider appointments: 04/05/22- CCM CSW; 05/04/22- CCM Pharmacy team; 05/11/22- endocrinology labs; 05/13/22- endocrinology provider; patient confirms is aware of all and has plans to attend as scheduled Discussed plans with patient for ongoing care management follow up and provided patient with direct contact information for care management team     Diabetes:  (Status: 03/08/22: Goal on Track (progressing): YES.) Long Term Goal   Lab Results  Component Value Date   HGBA1C 10.6 (H)  12/14/2021    Provided education to patient about basic DM disease process; Reviewed prescribed diet with patient carbohydrate modified, low sugar/ heart healthy low salt; Counseled on importance of regular laboratory monitoring as prescribed;        Confirmed patient continues monitoring blood sugars at home: today he reports general fasting blood sugar ranges between 138-165; post-prandial blood sugar ranges between 121- 170 Endorses monitoring blood sugars twice daily: generally fasting and then  again later in day Confirmed no recent low blood sugars at home; Reinforced previously provided education around signs/ symptoms low blood sugar along with corresponding action plan Reviewed recent endocrinology provider office visit 02/25/22- he denies post-visit questions/ concerns  Confirmed patient continues taking insulin as prescribed: today he reports taking "36 U" BID, consistent with notes from recent endocrinology provider office visit Reinforced previously provided education around appropriate dietary choices in setting of DMII/ HTN: benefits of following heart healthy, low salt, low carbohydrate/ low sugar diet were again stressed  Hypertension: (Status: 03/08/22: Goal on Track (progressing): YES.) Long Term Goal  Last practice recorded BP readings:  BP Readings from Last 3 Encounters:  02/25/22 (!) 142/82  12/17/21 124/70  12/17/21 136/84  Most recent eGFR/CrCl: No results found for: EGFR  No components found for: CRCL  Evaluation of current treatment plan related to hypertension self management and patient's adherence to plan as established by provider;   Reviewed prescribed diet heart healthy, low salt, low cholesterol, carbohydrate modified, low sugar Counseled on the importance of exercise goals with target of 150 minutes per week Discussed complications of poorly controlled blood pressure such as heart disease, stroke, circulatory complications, vision complications, kidney impairment, sexual dysfunction;  Confirmed patient continues to stay active-- again reports walking "for about an hour, usually twice a day:" walks around/ near his senior apartment complex; benefits of activity/ exercise, without overdoing discussed with patient and he was encouraged to stay active at baseline: weather considerations/ safety/ fall prevention discussed with patient; confirmed that he does not walk when weather is not conducive-- encouraged him to pay particular attention to being conservative  with activity as warmer weather approaches: will provide additional printed educational material  Patient Goals/Self-Care Activities: As evidenced by review of EHR, collaboration with care team, and patient reporting during CCM RN CM outreach,  Patient Charles Parker  will: Take medications as prescribed Attend all scheduled provider appointments Call pharmacy for medication refills Call provider office for new concerns or questions Continue to check fasting (first thing in the morning, before eating) blood sugars at home; check your blood sugar again later in the day, 2 hours after eating a regular sized meal Write down your blood sugars at home so we can review these together when we talk over the phone next: the blood sugars we reviewed today are all in good range Continue to follow heart healthy, low salt, low cholesterol, carbohydrate-modified, low sugar diet Continue staying as active as you are able: do not over-do activity in the coming warmer weather Keep up the great work preventing falls-- continue to use your cane as/ if needed    Plan: Telephone follow up appointment with care management team member scheduled for: Tuesday, May 18, 2022 at 10:00 am The patient has been provided with contact information for the care management team and has been advised to call with any health related questions or concerns  Oneta Rack, RN, BSN, [place] [phone]: direct office

## 2022-03-14 ENCOUNTER — Other Ambulatory Visit: Payer: Self-pay | Admitting: Endocrinology

## 2022-03-14 DIAGNOSIS — Z794 Long term (current) use of insulin: Secondary | ICD-10-CM

## 2022-03-14 DIAGNOSIS — E1165 Type 2 diabetes mellitus with hyperglycemia: Secondary | ICD-10-CM

## 2022-03-16 ENCOUNTER — Other Ambulatory Visit: Payer: Self-pay | Admitting: Endocrinology

## 2022-03-16 DIAGNOSIS — E1165 Type 2 diabetes mellitus with hyperglycemia: Secondary | ICD-10-CM

## 2022-03-16 DIAGNOSIS — Z794 Long term (current) use of insulin: Secondary | ICD-10-CM

## 2022-03-30 ENCOUNTER — Telehealth: Payer: Medicare Other

## 2022-04-02 DIAGNOSIS — E1159 Type 2 diabetes mellitus with other circulatory complications: Secondary | ICD-10-CM

## 2022-04-02 DIAGNOSIS — I10 Essential (primary) hypertension: Secondary | ICD-10-CM | POA: Diagnosis not present

## 2022-04-02 DIAGNOSIS — Z794 Long term (current) use of insulin: Secondary | ICD-10-CM | POA: Diagnosis not present

## 2022-04-05 ENCOUNTER — Ambulatory Visit: Payer: Self-pay | Admitting: Licensed Clinical Social Worker

## 2022-04-05 ENCOUNTER — Telehealth: Payer: Medicare Other

## 2022-04-05 ENCOUNTER — Telehealth: Payer: Self-pay | Admitting: Licensed Clinical Social Worker

## 2022-04-05 DIAGNOSIS — I10 Essential (primary) hypertension: Secondary | ICD-10-CM

## 2022-04-05 DIAGNOSIS — Z794 Long term (current) use of insulin: Secondary | ICD-10-CM

## 2022-04-05 NOTE — Chronic Care Management (AMB) (Signed)
A user error has taken place: encounter opened in error, closed for administrative reasons.

## 2022-04-05 NOTE — Chronic Care Management (AMB) (Signed)
    Clinical Social Work  Chronic Care Management   Phone Outreach    04/05/2022 Name: [name] MRN: [id number] DOB: November 18, 1945  [name] is a 76 y.o. year old male who is a primary care patient of Elayne Snare, MD .   Reason for referral: Advanced Directive Education.    F/U phone call today to assess needs, progress and barriers with care plan goals.   Telephone outreach was unsuccessful. Left message for patient  Plan: According to chart Dr. [name] is no longer PCP. New PCP is not at [facility].  Will disconnect from care team at this time.  No ongoing needs identified .  Patient has everything to completed Advance Directive.  Review of patient status, including review of consultants reports, relevant laboratory and other test results, and collaboration with appropriate care team members and the patient's provider was performed as part of comprehensive patient evaluation and provision of care management services.    Casimer Lanius, LCSW Licensed Clinical Social Worker [facility]  [place]  [phone]

## 2022-04-05 NOTE — Patient Instructions (Signed)
  I am sorry you were unable to keep your phone appointment today.    It was a pleasure working with you, and I hope you continue to make great strides in improving your health.  No Follow up Scheduled:  You do not require continued follow-up by care coordination team Per our conversation, I will disconnect from your care team at this time Please contact the office if needed  Casimer Lanius, LCSW Licensed Clinical Social Worker [facility]  [place]  [phone]

## 2022-05-04 ENCOUNTER — Telehealth: Payer: Medicare Other

## 2022-05-04 ENCOUNTER — Other Ambulatory Visit: Payer: Self-pay | Admitting: Internal Medicine

## 2022-05-04 DIAGNOSIS — E785 Hyperlipidemia, unspecified: Secondary | ICD-10-CM

## 2022-05-04 DIAGNOSIS — E1165 Type 2 diabetes mellitus with hyperglycemia: Secondary | ICD-10-CM

## 2022-05-04 DIAGNOSIS — D6859 Other primary thrombophilia: Secondary | ICD-10-CM

## 2022-05-04 DIAGNOSIS — Z794 Long term (current) use of insulin: Secondary | ICD-10-CM

## 2022-05-04 MED ORDER — SIMVASTATIN 40 MG PO TABS: 40.0000 mg | ORAL_TABLET | Freq: Every day | ORAL | 0 refills | Status: DC

## 2022-05-04 MED ORDER — RIVAROXABAN 10 MG PO TABS: 10.0000 mg | ORAL_TABLET | Freq: Every day | ORAL | 0 refills | Status: DC

## 2022-05-11 ENCOUNTER — Other Ambulatory Visit (INDEPENDENT_AMBULATORY_CARE_PROVIDER_SITE_OTHER): Payer: Medicare Other

## 2022-05-11 DIAGNOSIS — Z794 Long term (current) use of insulin: Secondary | ICD-10-CM | POA: Diagnosis not present

## 2022-05-11 DIAGNOSIS — E1165 Type 2 diabetes mellitus with hyperglycemia: Secondary | ICD-10-CM

## 2022-05-11 LAB — BASIC METABOLIC PANEL
BUN: 13 mg/dL (ref 6–23)
CO2: 26 mEq/L (ref 19–32)
Calcium: 9.9 mg/dL (ref 8.4–10.5)
Chloride: 102 mEq/L (ref 96–112)
Creatinine, Ser: 1.12 mg/dL (ref 0.40–1.50)
GFR: 63.95 mL/min (ref >60.00)
Glucose, Bld: 234 mg/dL — ABNORMAL HIGH (ref 70–99)
Potassium: 4 mEq/L (ref 3.5–5.1)
Sodium: 139 mEq/L (ref 135–145)

## 2022-05-11 LAB — HEMOGLOBIN A1C: Hgb A1c MFr Bld: 8.4 % — ABNORMAL HIGH (ref 4.6–6.5)

## 2022-05-12 ENCOUNTER — Telehealth: Payer: Self-pay | Admitting: Endocrinology

## 2022-05-12 NOTE — Telephone Encounter (Signed)
Pt is requesting a callback. He would like to know if it is time for him to get another coloscopy. He said his last one was 5 years ago.   Please advise.

## 2022-05-13 ENCOUNTER — Ambulatory Visit: Payer: Medicare Other | Admitting: Endocrinology

## 2022-05-13 NOTE — Telephone Encounter (Signed)
Pt is wanting to know if it is time for him to have another Colonoscopy as his last one was 3 years ago?

## 2022-05-17 NOTE — Telephone Encounter (Signed)
Called and spoke with patient's brother [name]). He has been advised that pt is due for recall colon but he will need an office visit with Dr. Havery Moros before we can schedule. Pt has been scheduled for a follow up appt with Dr. Havery Moros on Wednesday, 05/19/22 at 8:30 am. I provided [name] with the office address. [name] verbalized understanding of all information and had no concerns at the end of the call.

## 2022-05-17 NOTE — Telephone Encounter (Signed)
Hi thanks. Yes he is due for colonoscopy now (last done 11/2018 with numerous pre-cancerous polyps). We can help coordinate follow up to schedule.  [place] this patient will need an office visit to coordinate colonoscopy if he is still taking Xarelto, can you please coordinate with me or APP. Thanks

## 2022-05-18 ENCOUNTER — Ambulatory Visit: Payer: Medicare Other | Admitting: *Deleted

## 2022-05-18 DIAGNOSIS — I10 Essential (primary) hypertension: Secondary | ICD-10-CM

## 2022-05-18 DIAGNOSIS — E118 Type 2 diabetes mellitus with unspecified complications: Secondary | ICD-10-CM

## 2022-05-18 NOTE — Chronic Care Management (AMB) (Signed)
Care Management    RN Visit Note  05/18/2022 Name: Charles Parker MRN: [id number] DOB: Jul 15, 1946  Subjective: Charles Parker is a 76 y.o. year old male who is a primary care patient of Charles Lima, MD. The care management team was consulted for assistance with disease management and care coordination needs.    Engaged with patient by telephone for follow up visit/ RN CM case closure in response to provider referral for case management and/or care coordination services.   Consent to Services:   Charles Parker was given information about Care Management services 11/17/20 including:  Care Management services includes personalized support from designated clinical staff supervised by his physician, including individualized plan of care and coordination with other care providers 24/7 contact phone numbers for assistance for urgent and routine care needs. The patient may stop case management services at any time by phone call to the office staff.  Patient agreed to services and consent obtained.   Assessment: Review of patient past medical history, allergies, medications, health status, including review of consultants reports, laboratory and other test data, was performed as part of comprehensive evaluation and provision of chronic care management services.   SDOH (Social Determinants of Health) assessments and interventions performed:  SDOH Interventions    Flowsheet Row Most Recent Value  SDOH Interventions   Food Insecurity Interventions Intervention Not Indicated  Transportation Interventions Intervention Not Indicated  [continues to drive self]     Care Plan  Allergies  Allergen Reactions   Metformin And Related Diarrhea   Codeine Rash    All over the body   Outpatient Encounter Medications as of 05/18/2022  Medication Sig   Alcohol Swabs PADS Use to clean area before insulin injections. DX E11.8   blood glucose meter kit and supplies KIT 1 each by Does not apply route  daily as needed. Accu-Chek Aviva preferred by patient. Use up to four times daily as directed. (FOR ICD-9 250.00, 250.01).   Cholecalciferol 50 MCG (2000 UT) TABS Take 1 tablet (2,000 Units total) by mouth daily.   Continuous Blood Gluc Receiver (FREESTYLE LIBRE 2 READER) DEVI 1 Act by Does not apply route daily.   Continuous Blood Gluc Sensor (FREESTYLE LIBRE 2 SENSOR) MISC 1 Act by Does not apply route daily.   FARXIGA 10 MG TABS tablet TAKE 1 TABLET BY MOUTH DAILY   gabapentin (NEURONTIN) 300 MG capsule TAKE 1 CAPSULE(300 MG) BY MOUTH TWICE DAILY (Patient taking differently: Take 300 mg by mouth daily. TAKE 1 CAPSULE(300 MG) BY MOUTH TWICE DAILY)   glimepiride (AMARYL) 4 MG tablet TAKE 1 TABLET BY MOUTH DAILY  BEFORE BREAKFAST   Glucagon (GVOKE HYPOPEN 2-PACK) 1 MG/0.2ML SOAJ Inject 1 Act into the skin daily as needed.   glucose blood test strip Use Accu Chek Aviva Test Strips as instructed to check blood sugar three times daily.   Insulin Lispro Prot & Lispro (HUMALOG MIX 75/25 KWIKPEN) (75-25) 100 UNIT/ML Kwikpen Inject 36 Units into the skin in the morning and at bedtime. (Patient taking differently: Inject 36 Units into the skin in the morning and at bedtime. 36 in morning and 30 at night)   Insulin Pen Needle (B-D UF III MINI PEN NEEDLES) 31G X 5 MM MISC Inject 1 Act into the skin 2 (two) times daily. ADMINISTER 30 UNITS EVERY EVENING   ketoconazole (NIZORAL) 2 % cream Apply 1 application. topically 2 (two) times daily.   losartan (COZAAR) 25 MG tablet Take 1 tablet (25 mg total)  by mouth daily.   pioglitazone (ACTOS) 45 MG tablet Take 1 tablet (45 mg total) by mouth daily.   rivaroxaban (XARELTO) 10 MG TABS tablet Take 1 tablet (10 mg total) by mouth daily.   simvastatin (ZOCOR) 40 MG tablet Take 1 tablet (40 mg total) by mouth at bedtime.   solifenacin (VESICARE) 10 MG tablet Take 1 tablet (10 mg total) by mouth daily.   No facility-administered encounter medications on file as of  05/18/2022.   Patient Active Problem List   Diagnosis Date Noted   Urinary incontinence 11/05/2021   Encounter for general adult medical examination with abnormal findings 11/07/2020   Elevated factor VIII level 10/08/2019   Candidal balanitis 03/29/2019   Erectile dysfunction due to diabetes mellitus ([place]) 03/29/2019   Essential hypertension 05/18/2018   Vitamin D insufficiency 05/18/2018   Hallux rigidus, right foot 02/14/2017   Degenerative arthritis of left knee 01/11/2017   OAB (overactive bladder) 01/09/2017   Iron deficiency 12/16/2016   Type 2 diabetes mellitus with complication, with long-term current use of insulin ([place]) 12/15/2016   Hyperlipidemia with target LDL less than 70 10/21/2016   Thoracic spine fracture ([place]) 10/21/2016   Benign prostatic hyperplasia (BPH) with straining on urination 06/20/2013   Tubulovillous adenoma of colon with high grade dysplasia (rectosigmoid) s/p LAR with bladder repair 02/03/12. 12/27/2011   Conditions to be addressed/monitored: HTN and DMII  Care Plan : RN Care Manager Plan of Care  Updates made by Knox Royalty, RN since 05/18/2022 12:00 AM     Problem: Chronic Disease Management Needs   Priority: High     Long-Range Goal: Ongoing adherence to established plan of care for long term chronic disease management   Start Date: 12/25/2021  Expected End Date: 12/26/2022  Priority: High  Note:   Current Barriers:  Chronic Disease Management support and education needs related to HTN and DMII Lives alone- senior apartments, first level; reports apartment management currently starting to improve apartments for safety updating Daughter lives in [place] and patient occasionally visits with his daughter  [name] Clinical Goal(s):  Patient will demonstrate improved health management independence as evidenced by adherence to plan of care for DMII and HTN        through collaboration with Consulting civil engineer, provider, and care team.    Interventions: 1:1 collaboration with primary care provider regarding development and update of comprehensive plan of care as evidenced by provider attestation and co-signature Inter-disciplinary care team collaboration (see longitudinal plan of care) Evaluation of current treatment plan related to  self management and patient's adherence to plan as established by provider Review of patient status, including review of consultants reports, relevant laboratory and other test results, and medications completed SDOH updated: no new/ unmet concerns identified Pain assessment updated: continues to deny pain Falls assessment updated: continues to deny new/ recent falls x 12 months- currently reports not using assistive devices, has cane and uses as/ if needed;  positive reinforcement provided with encouragement to continue efforts at fall prevention; previously provided education around fall risks/ prevention reinforced Medications discussed: reports continues to independently self-manage and denies current concerns/ issues/ questions around medications; endorses adherence to taking all medications as prescribed Reviewed upcoming scheduled provider appointments: 05/19/22- GI provider; 05/20/22- endocrinology provider; patient confirms is aware of all and has plans to attend as scheduled Provided telephone number to GI provider, as he is unsure what instructions are prior to tomorrow's office visit: encouraged him to call promptly and e states he  will do, "as soon as we hang up" Discussed plans with patient for ongoing care management follow up- patient denies current care coordination/ care management needs and is agreeable to RN CM case closure today; verbalizes understanding to contact PCP or other care providers for any needs that arise in the future, and confirms he has contact information for all care providers      Diabetes:  (Status: 05/18/22: Goal Met.) Long Term Goal   Lab Results  Component Value  Date   HGBA1C 8.4 (H) 05/11/2022    Provided education to patient about basic DM disease process; Reviewed prescribed diet with patient carbohydrate modified, low sugar/ heart healthy low salt; Counseled on importance of regular laboratory monitoring as prescribed;        Assessed social determinant of health barriers;        Confirmed patient continues monitoring blood sugars at home: today he reports general fasting blood sugar ranges between 140-160; post-prandial blood sugar ranges between 121- 170; reports one isolated high value of "310;" he is unable to tell me if this was a fasting or post-prandial value; states, "it came right back down within a couple of hours" Endorses monitoring blood sugars twice daily: generally fasting and then again later in day Confirmed no recent low blood sugars at home; Reinforced previously provided education around signs/ symptoms low blood sugar along with corresponding action plan Confirmed patient continues taking insulin as prescribed: today he reports taking "35 U in the morning and 30 units at night;" this is consistent with most recently provided instructions from endocrinology provider;  Reinforced previously provided education around appropriate dietary choices in setting of DMII/ HTN: benefits of following heart healthy, low salt, low carbohydrate/ low sugar diet were again stressed  Hypertension: (Status: 05/18/22: Goal Met.) Long Term Goal  Last practice recorded BP readings:  BP Readings from Last 3 Encounters:  02/25/22 (!) 142/82  12/17/21 124/70  12/17/21 136/84  Most recent eGFR/CrCl: No results found for: EGFR  No components found for: CRCL  Evaluation of current treatment plan related to hypertension self management and patient's adherence to plan as established by provider;   Reviewed prescribed diet heart healthy, low salt, low cholesterol, carbohydrate modified, low sugar Counseled on the importance of exercise goals with target of  150 minutes per week Discussed complications of poorly controlled blood pressure such as heart disease, stroke, circulatory complications, vision complications, kidney impairment, sexual dysfunction;  Confirmed patient continues to stay active-- again reports walking in his senior community "for about an hour, usually twice a day:" benefits of activity/ exercise, without overdoing in heat of current weather discussed with patient and he was encouraged to stay active at baseline: safety/ fall prevention discussed with patient    Plan:  No further follow up required: patient denies current care coordination/ care management needs and is agreeable to RN CM case closure today; case closure accordingly     Oneta Rack, RN, BSN, [place] [phone]: direct office

## 2022-05-19 ENCOUNTER — Telehealth: Payer: Self-pay

## 2022-05-19 ENCOUNTER — Ambulatory Visit (INDEPENDENT_AMBULATORY_CARE_PROVIDER_SITE_OTHER): Payer: Medicare Other | Admitting: Gastroenterology

## 2022-05-19 ENCOUNTER — Encounter: Payer: Self-pay | Admitting: Gastroenterology

## 2022-05-19 VITALS — BP 120/80 | HR 80 | Ht 71.5 in | Wt 187.0 lb

## 2022-05-19 DIAGNOSIS — Z7901 Long term (current) use of anticoagulants: Secondary | ICD-10-CM | POA: Diagnosis not present

## 2022-05-19 DIAGNOSIS — Z86010 Personal history of colonic polyps: Secondary | ICD-10-CM

## 2022-05-19 MED ORDER — NA SULFATE-K SULFATE-MG SULF 17.5-3.13-1.6 GM/177ML PO SOLN: 1.0000 | Freq: Once | ORAL | 0 refills | Status: AC

## 2022-05-19 NOTE — Telephone Encounter (Signed)
Error

## 2022-05-19 NOTE — Telephone Encounter (Signed)
   Charles Parker [birth year] [id number]  Dear Dr. [name]:  We have scheduled the above named patient for a(n) colonoscopy procedure. Our records show that (s)he is on anticoagulation therapy.  Please advise as to whether the patient may come off their therapy of Xarelto 2 days prior to their procedure which is scheduled for 06/04/2022.  Please route your response to [facility] or fax response to [phone].  Sincerely,    [facility]

## 2022-05-19 NOTE — Progress Notes (Signed)
HPI :  76 year old male with a history of DVT on chronic Xarelto, history of colon polyps, diabetes, referred here by Dr. Scarlette Calico to discuss surveillance colonoscopy.  I last saw him in February 2020.  Recall he had a history of polyps over the years as outlined below.  In September 2017 he had a poor prep, 1 polyp was removed at that time which was an adenoma.  He had a double prep for his colonoscopy with me in February 2020, 9 polyps removed, most adenomas.  I had recommended a repeat exam 3 years later.  He states has been doing pretty well since have last seen him.  He denies any problems with constipation, diarrhea, rectal bleeding.  Denies any abdominal pains.  He continues to take Xarelto for history of DVT.  He states his diabetes is under better control at this time, hemoglobin A1c currently 8.4.  He denies any cardiopulmonary symptoms.  We discussed if he wanted to have any further colonoscopy exams at his age given comorbidities.  If he does have another colonoscopy I recommended a double prep and we discussed if he would be willing to do that.  He is not comfortable stopping further colonoscopy surveillance and would like at least 1 more exam prior to stopping.  Otherwise he denies other complaints today and is feeling well.  Colonoscopy 11/21/2018: The perianal and digital rectal examinations were normal. - Five sessile polyps were found in the ascending colon. The polyps were 3 mm in size. These polyps were removed with a cold snare. Resection and retrieval were complete. - Four sessile polyps were found in the transverse colon. The polyps were 3 to 4 mm in size. These polyps were removed with a cold snare. Resection and retrieval were complete. - Scattered small-mouthed diverticula were found in the right colon and left colon. - There was evidence of a prior surgical anastomosis in the rectum. This was characterized by healthy appearing mucosa. - The prep took time to lavage  to achieve adequate views. The exam was otherwise without abnormality.  Surgical [P], ascending colon, transverse, polyp (9) - TUBULAR ADENOMA(S). - HYPERPLASTIC POLYP(S). - NO HIGH GRADE DYSPLASIA OR MALIGNANCY.   Colonoscopy 07/02/2016 - poor prep, 51m polyp removed from ascending colon, diverticulosis,    Colonoscopy on 05/2013 - 5 flat or sessile polyps in the sigmoid and transverse colon, removed with forceps. Surgical anastomosis in sigmoid colon, diverticulosis. Path shows serrated adenoma of the transverse colon, with hyperplastic polyps in the left colon.    Past Medical History:  Diagnosis Date   Clotting disorder ([place]    DVT   Diabetes mellitus    type II   Diverticulosis    DVT (deep venous thrombosis) ([place]    Glaucoma    no eye drops    Hepatic cyst    Hypercholesterolemia    Hypertension    Renal cyst    Sleep apnea    Tubulovillous adenoma      Past Surgical History:  Procedure Laterality Date   BLADDER NECK RECONSTRUCTION  02/03/2012   Procedure: BLADDER NECK REPAIR;  Surgeon: SAdin Hector Charles Parker;  Location: [facility] ORS;  Service: General;;   COLONOSCOPY  02/2013   polyp removal(mult.)   KNEE SURGERY  2004   left   POLYPECTOMY     PROCTOSCOPY  02/03/2012   Procedure: PROCTOSCOPY;  Surgeon: SAdin Hector Charles Parker;  Location: [facility] ORS;  Service: General;;   STOMACH SURGERY  02/2012   TEE WITHOUT  CARDIOVERSION N/A 10/05/2016   Procedure: TRANSESOPHAGEAL ECHOCARDIOGRAM (TEE);  Surgeon: Sanda Klein, Charles Parker;  Location[facility] ENDOSCOPY;  Service: Cardiovascular;  Laterality: N/A;   Family History  Problem Relation Age of Onset   Hypertension Mother    Diabetes Mother    Cancer Mother        ? location   Colon cancer Father 22   Colon polyps Neg Hx    Liver cancer Neg Hx    Social History   Tobacco Use   Smoking status: Never   Smokeless tobacco: Never  Vaping Use   Vaping Use: Never used  Substance Use Topics   Alcohol use: No    Alcohol/week: 0.0 standard drinks  of alcohol    Comment: once every two months   Drug use: No   Current Outpatient Medications  Medication Sig Dispense Refill   Alcohol Swabs PADS Use to clean area before insulin injections. DX E11.8 200 each 3   blood glucose meter kit and supplies KIT 1 each by Does not apply route daily as needed. Accu-Chek Aviva preferred by patient. Use up to four times daily as directed. (FOR ICD-9 250.00, 250.01).     Cholecalciferol 50 MCG (2000 UT) TABS Take 1 tablet (2,000 Units total) by mouth daily. 90 tablet 3   Continuous Blood Gluc Receiver (FREESTYLE LIBRE 2 READER) DEVI 1 Act by Does not apply route daily. 2 each 5   Continuous Blood Gluc Sensor (FREESTYLE LIBRE 2 SENSOR) MISC 1 Act by Does not apply route daily. 2 each 5   FARXIGA 10 MG TABS tablet TAKE 1 TABLET BY MOUTH DAILY 90 tablet 3   gabapentin (NEURONTIN) 300 MG capsule TAKE 1 CAPSULE(300 MG) BY MOUTH TWICE DAILY (Patient taking differently: Take 300 mg by mouth daily. TAKE 1 CAPSULE(300 MG) BY MOUTH TWICE DAILY) 180 capsule 1   glimepiride (AMARYL) 4 MG tablet TAKE 1 TABLET BY MOUTH DAILY  BEFORE BREAKFAST 100 tablet 2   Glucagon (GVOKE HYPOPEN 2-PACK) 1 MG/0.2ML SOAJ Inject 1 Act into the skin daily as needed. 2 mL 5   glucose blood test strip Use Accu Chek Aviva Test Strips as instructed to check blood sugar three times daily. 100 each 3   Insulin Lispro Prot & Lispro (HUMALOG MIX 75/25 KWIKPEN) (75-25) 100 UNIT/ML Kwikpen Inject 36 Units into the skin in the morning and at bedtime. (Patient taking differently: Inject 36 Units into the skin in the morning and at bedtime. 36 in morning and 30 at night) 66 mL 1   Insulin Pen Needle (B-D UF III MINI PEN NEEDLES) 31G X 5 MM MISC Inject 1 Act into the skin 2 (two) times daily. ADMINISTER 30 UNITS EVERY EVENING 100 each 2   ketoconazole (NIZORAL) 2 % cream Apply 1 application. topically 2 (two) times daily. 60 g 3   losartan (COZAAR) 25 MG tablet Take 1 tablet (25 mg total) by mouth daily.  90 tablet 1   Na Sulfate-K Sulfate-Mg Sulf 17.5-3.13-1.6 GM/177ML SOLN Take 1 kit by mouth once for 1 dose. 354 mL 0   pioglitazone (ACTOS) 45 MG tablet Take 1 tablet (45 mg total) by mouth daily. 90 tablet 3   rivaroxaban (XARELTO) 10 MG TABS tablet Take 1 tablet (10 mg total) by mouth daily. 100 tablet 0   simvastatin (ZOCOR) 40 MG tablet Take 1 tablet (40 mg total) by mouth at bedtime. 100 tablet 0   solifenacin (VESICARE) 10 MG tablet Take 1 tablet (10 mg total) by mouth daily.  90 tablet 1   No current facility-administered medications for this visit.   Allergies  Allergen Reactions   Metformin And Related Diarrhea   Codeine Rash    All over the body     Review of Systems: All systems reviewed and negative except where noted in HPI.   Lab Results  Component Value Date   WBC 4.6 11/05/2021   HGB 16.1 11/05/2021   HCT 48.5 11/05/2021   MCV 92.9 11/05/2021   PLT 166.0 11/05/2021    Lab Results  Component Value Date   CREATININE 1.12 05/11/2022   BUN 13 05/11/2022   NA 139 05/11/2022   K 4.0 05/11/2022   CL 102 05/11/2022   CO2 26 05/11/2022    Lab Results  Component Value Date   ALT 24 11/05/2021   AST 22 11/05/2021   ALKPHOS 66 11/05/2021   BILITOT 0.6 11/05/2021     Physical Exam: BP 120/80   Pulse 80   Ht 5' 11.5" (1.816 m)   Wt 187 lb (84.8 kg)   BMI 25.72 kg/m  Constitutional: Pleasant,well-developed, male in no acute distress. HEENT: Normocephalic and atraumatic. Conjunctivae are normal. No scleral icterus. Neck supple.  Cardiovascular: Normal rate, regular rhythm.  Pulmonary/chest: Effort normal and breath sounds normal.  Abdominal: Soft, nondistended, nontender.  There are no masses palpable.  Extremities: no edema Lymphadenopathy: No cervical adenopathy noted. Neurological: Alert and oriented to person place and time. Skin: Skin is warm and dry. No rashes noted. Psychiatric: Normal mood and affect. Behavior is normal.   ASSESSMENT: 76  y.o. male here for assessment of the following  1. History of colon polyps   2. Anticoagulated    Numerous adenomas removed in the past, has required double prep to complete his exams previously.  He remains on chronic Xarelto for history of DVT.  We discussed that his age and comorbidities if he wanted to have another colonoscopy, given he had 9 polyps on his last exam, many adenomas, he wants to have at least 1 more exam prior to stopping further surveillance.  I discussed colonoscopy with him, risk benefits of that and anesthesia and he wants to proceed.  Further recommendations pending results.  If no high risk lesions I think this will be his last colonoscopy.  Again recommended double prep for this exam, he is agreeable to do that.  We will reach out to Dr. [name] to see if okay to hold his Xarelto for 2 days.  Hopefully no Lovenox bridge is warranted.   PLAN: - schedule colonoscopy at the [facility] with double prep - recommend he hold Xarelto for 2 days prior to the exam - will reach out to Dr. Scarlette Calico to see if this is acceptable   Charles Mango, Charles Parker [facility]  CC: Janith Lima, Charles Parker

## 2022-05-19 NOTE — Patient Instructions (Signed)
_______________________________________________________  If you are age 76 or older, your body mass index should be between 23-30. Your Body mass index is 25.72 kg/m. If this is out of the aforementioned range listed, please consider follow up with your Primary Care Provider.  If you are age 31 or younger, your body mass index should be between 19-25. Your Body mass index is 25.72 kg/m. If this is out of the aformentioned range listed, please consider follow up with your Primary Care Provider.   ________________________________________________________  The [facility] providers would like to encourage you to use [facility] to communicate with providers for non-urgent requests or questions.  Due to long hold times on the telephone, sending your provider a message by [facility] may be a faster and more efficient way to get a response.  Please allow 48 business hours for a response.  Please remember that this is for non-urgent requests.  _______________________________________________________  Dennis Bast have been scheduled for a colonoscopy. Please follow written instructions given to you at your visit today.  Please pick up your prep supplies at the pharmacy within the next 1-3 days. If you use inhalers (even only as needed), please bring them with you on the day of your procedure.

## 2022-05-20 ENCOUNTER — Encounter: Payer: Self-pay | Admitting: Endocrinology

## 2022-05-20 ENCOUNTER — Ambulatory Visit (INDEPENDENT_AMBULATORY_CARE_PROVIDER_SITE_OTHER): Payer: Medicare Other | Admitting: Endocrinology

## 2022-05-20 VITALS — BP 116/84 | HR 91 | Ht 71.5 in | Wt 184.0 lb

## 2022-05-20 DIAGNOSIS — E1165 Type 2 diabetes mellitus with hyperglycemia: Secondary | ICD-10-CM

## 2022-05-20 DIAGNOSIS — Z794 Long term (current) use of insulin: Secondary | ICD-10-CM | POA: Diagnosis not present

## 2022-05-20 NOTE — Patient Instructions (Addendum)
Take 40 units in am also  Check blood sugars on waking up days 3 a week  Also check blood sugars about 2 hours after meals and do this after different meals by rotation  Recommended blood sugar levels on waking up are 90-130 and about 2 hours after meal is 130-180  Please bring your blood sugar monitor to each visit, thank you  For colonoscopy for 2 days take 1/2 doses

## 2022-05-20 NOTE — Progress Notes (Signed)
Patient ID: Charles Parker, male   DOB: 1946/06/04, 76 y.o.   MRN: [id number]          Reason for Appointment: Follow-up for Type 2 Diabetes  Referring physician: Scarlette Calico   History of Present Illness:          Date of diagnosis of type 2 diabetes mellitus: 2014        Background history:   He has been on various oral hypoglycemic drugs since the onset including metformin, Januvia and Amaryl Was also on Invokana in 2015 for about a year but not clear why this was stopped His level of control has been quite variable with highest A1c 13.6 in 06/2015  He has been on insulin since about 10/2014 as documented in his record  Recent history:   INSULIN regimen is:  NovoLog mix 35- 40 units   Non-insulin hypoglycemic drugs the patient is taking SHF:WYOVZCH 10 mg daily  His A1c is better than usual at 8.4 compared to 10.6  Fructosamine last 291, previously at 417    Current management, blood sugar patterns and problems identified: He appears to have lost 9 pounds since May and not clear why  Likely has cut back on his intake, he says he has nausea but only every 2 or 3 weeks  Previously taking 40 units twice a day but now taking only 35 in the morning; also if he has a little nausea in the evening he will take only 35 units His blood sugars appear to be relatively higher later in the day and was over 200 midday in the lab  Fasting readings are generally in the low 100 range especially in the last 4 days However he is usually not checking readings after meals or in the evening  He has limited ability to do walking  He thinks he is taking his insulin doses regularly  As before he is taking his [place] regularly   Side effects from medications have been: Hives from metformin  Compliance with the medical regimen: Inconsistent  Glucose monitoring:  done< 1 times a   day usually        Glucometer: Accu-Chek guide       Blood Glucose readings as below, AVERAGE 162 for 30  days  FASTING range 104-179  Afternoon 178, bedtime 119   Self-care: The diet that the patient has been following is: tries to limit [employer] .      Typical meal intake: Breakfast is eggs/cereal or oatmeal usually, lunch:  Lean cuisine or sandwiches.  Dinner chicken and vegetables.   Snacks will be chips, peanut butter crackers Supper at 6 pm                Dietician visit, most recent:3/19              Weight history:  Wt Readings from Last 3 Encounters:  05/20/22 184 lb (83.5 kg)  05/19/22 187 lb (84.8 kg)  02/25/22 193 lb (87.5 kg)    Glycemic control:   Lab Results  Component Value Date   HGBA1C 8.4 (H) 05/11/2022   HGBA1C 10.6 (H) 12/14/2021   HGBA1C 10.3 (H) 11/05/2021   Lab Results  Component Value Date   MICROALBUR 0.7 02/23/2022   LDLCALC 111 (H) 10/09/2021   CREATININE 1.12 05/11/2022   Lab Results  Component Value Date   MICRALBCREAT 0.6 02/23/2022    Lab Results  Component Value Date   FRUCTOSAMINE 369 (H) 02/23/2022   FRUCTOSAMINE  417 (H) 10/09/2021   FRUCTOSAMINE 546 (H) 06/25/2021      Allergies as of 05/20/2022       Reactions   Metformin And Related Diarrhea   Codeine Rash   All over the body        Medication List        Accurate as of May 20, 2022  1:50 PM. If you have any questions, ask your nurse or doctor.          Alcohol Swabs Pads Use to clean area before insulin injections. DX E11.8   B-D UF III MINI PEN NEEDLES 31G X 5 MM Misc Generic drug: Insulin Pen Needle Inject 1 Act into the skin 2 (two) times daily. ADMINISTER 30 UNITS EVERY EVENING   blood glucose meter kit and supplies Kit 1 each by Does not apply route daily as needed. Accu-Chek Aviva preferred by patient. Use up to four times daily as directed. (FOR ICD-9 250.00, 250.01).   Cholecalciferol 50 MCG (2000 UT) Tabs Take 1 tablet (2,000 Units total) by mouth daily.   Farxiga 10 MG Tabs tablet Generic drug: dapagliflozin propanediol TAKE 1 TABLET  BY MOUTH DAILY   FreeStyle Libre 2 Reader Devi 1 Act by Does not apply route daily.   FreeStyle Libre 2 Sensor Misc 1 Act by Does not apply route daily.   gabapentin 300 MG capsule Commonly known as: NEURONTIN TAKE 1 CAPSULE(300 MG) BY MOUTH TWICE DAILY What changed:  how much to take how to take this when to take this   glimepiride 4 MG tablet Commonly known as: AMARYL TAKE 1 TABLET BY MOUTH DAILY  BEFORE BREAKFAST   glucose blood test strip Use Accu Chek Aviva Test Strips as instructed to check blood sugar three times daily.   Gvoke HypoPen 2-Pack 1 MG/0.2ML Soaj Generic drug: Glucagon Inject 1 Act into the skin daily as needed.   Insulin Lispro Prot & Lispro (75-25) 100 UNIT/ML Kwikpen Commonly known as: HumaLOG Mix 75/25 KwikPen Inject 36 Units into the skin in the morning and at bedtime. What changed: additional instructions   ketoconazole 2 % cream Commonly known as: NIZORAL Apply 1 application. topically 2 (two) times daily.   losartan 25 MG tablet Commonly known as: COZAAR Take 1 tablet (25 mg total) by mouth daily.   pioglitazone 45 MG tablet Commonly known as: Actos Take 1 tablet (45 mg total) by mouth daily.   rivaroxaban 10 MG Tabs tablet Commonly known as: Xarelto Take 1 tablet (10 mg total) by mouth daily.   simvastatin 40 MG tablet Commonly known as: ZOCOR Take 1 tablet (40 mg total) by mouth at bedtime.   solifenacin 10 MG tablet Commonly known as: VESICARE Take 1 tablet (10 mg total) by mouth daily.        Allergies:  Allergies  Allergen Reactions   Metformin And Related Diarrhea   Codeine Rash    All over the body    Past Medical History:  Diagnosis Date   Clotting disorder ([place])    DVT   Diabetes mellitus    type II   Diverticulosis    DVT (deep venous thrombosis) ([facility])    Glaucoma    no eye drops    Hepatic cyst    Hypercholesterolemia    Hypertension    Renal cyst    Sleep apnea    Tubulovillous adenoma      Past Surgical History:  Procedure Laterality Date   BLADDER NECK RECONSTRUCTION  02/03/2012  Procedure: BLADDER NECK REPAIR;  Surgeon: Adin Hector, MD;  Location: [facility];  Service: General;;   COLONOSCOPY  02/2013   polyp removal(mult.)   KNEE SURGERY  2004   left   POLYPECTOMY     PROCTOSCOPY  02/03/2012   Procedure: PROCTOSCOPY;  Surgeon: Adin Hector, MD;  Location: [facility];  Service: General;;   STOMACH SURGERY  02/2012   TEE WITHOUT CARDIOVERSION N/A 10/05/2016   Procedure: TRANSESOPHAGEAL ECHOCARDIOGRAM (TEE);  Surgeon: Sanda Klein, MD;  Location: [facility] END[facility]COPY;  Service: Cardiovascular;  Laterality: N/A;    Family History  Problem Relation Age of Onset   Hypertension Mother    Diabetes Mother    Cancer Mother        ? location   Colon cancer Father 24   Colon polyps Neg Hx    Liver cancer Neg Hx     Social History:  reports that he has never smoked. He has never used smokeless tobacco. He reports that he does not drink alcohol and does not use drugs.   Review of Systems  Lipid history: Has been taking simvastatin 40 mg with variable control, followed by PCP No recent labs available   Lab Results  Component Value Date   CHOL 194 10/09/2021   CHOL 153 08/19/2020   CHOL 202 (H) 05/12/2020   Lab Results  Component Value Date   HDL 52.10 10/09/2021   HDL 43.00 08/19/2020   HDL 50.80 05/12/2020   Lab Results  Component Value Date   LDLCALC 111 (H) 10/09/2021   LDLCALC 84 08/19/2020   LDLCALC 124 (H) 05/12/2020   Lab Results  Component Value Date   TRIG 153.0 (H) 10/09/2021   TRIG 130.0 08/19/2020   TRIG 134.0 05/12/2020   Lab Results  Component Value Date   CHOLHDL 4 10/09/2021   CHOLHDL 4 08/19/2020   CHOLHDL 4 05/12/2020   Lab Results  Component Value Date   LDLDIRECT 115.0 07/02/2019   LDLDIRECT 118.0 11/11/2015   LDLDIRECT 201.0 03/04/2015           He has history of neuropathy, not symptomatic and not taking gabapentin prescribed by  PCP  Most recent foot exam: 2/22  RENAL function is consistently normal with continuing Farxiga 10 mg  Lab Results  Component Value Date   CREATININE 1.12 05/11/2022   CREATININE 1.17 02/23/2022   CREATININE 1.18 12/14/2021    Hypertension: On losartan 25 mg prescribed by PCP  BP Readings from Last 3 Encounters:  05/20/22 116/84  05/19/22 120/80  02/25/22 (!) 142/82    Has had all 3 Covid shots  May have diabetic shoes for his foot deformities    Physical Examination:  BP 116/84   Pulse 91   Ht 5' 11.5" (1.816 m)   Wt 184 lb (83.5 kg)   SpO2 98%   BMI 25.31 kg/m       ASSESSMENT:  Diabetes type 2 on insulin  See history of present illness for detailed discussion of current diabetes management, blood sugar patterns and problems identified   He is on a regimen of premixed insulin twice a day and Farxiga  His A1c is recently better at 8.4 otherwise generally over 10% He has lost weight and likely doing better with his diet and avoiding high calorie foods Also likely taking his insulin more consistently The main difficulty is not having blood sugars after meals checked  PLAN:    He was asked to make sure he takes the same  dose of insulin every morning and not reduce it He will take 40 units consistently However he can cut down to 35 not feeling like eating a proper meal in the evening Also reminded him about regularly checking blood sugars consistently couple of hours after meals or at least at bedtime  Guidelines given for when he has his colonoscopy  May consider using freestyle libre next time since the [place] 3 is available   Patient Instructions  Take 40 units in am also  Check blood sugars on waking up days 3 a week  Also check blood sugars about 2 hours after meals and do this after different meals by rotation  Recommended blood sugar levels on waking up are 90-130 and about 2 hours after meal is 130-180  Please bring your blood sugar monitor  to each visit, thank you  For colonoscopy for 2 days take 1/2 doses       Elayne Snare 05/20/2022, 1:50 PM   Note: This office note was prepared with Dragon voice recognition system technology. Any transcriptional errors that result from this process are unintentional.

## 2022-05-24 ENCOUNTER — Encounter: Payer: Self-pay | Admitting: Internal Medicine

## 2022-05-25 NOTE — Telephone Encounter (Signed)
Spoke with patient and told him that per Dr. [name] he could hold his Xarelto for 2 days prior to his procedure.  Patient agreed.

## 2022-05-30 ENCOUNTER — Encounter: Payer: Self-pay | Admitting: Certified Registered Nurse Anesthetist

## 2022-06-04 ENCOUNTER — Ambulatory Visit (AMBULATORY_SURGERY_CENTER): Payer: Medicare Other | Admitting: Gastroenterology

## 2022-06-04 ENCOUNTER — Encounter: Payer: Self-pay | Admitting: Gastroenterology

## 2022-06-04 VITALS — BP 121/78 | HR 68 | Temp 97.1°F | Resp 26 | Ht 71.0 in | Wt 187.0 lb

## 2022-06-04 DIAGNOSIS — Z86010 Personal history of colonic polyps: Secondary | ICD-10-CM | POA: Diagnosis not present

## 2022-06-04 DIAGNOSIS — I10 Essential (primary) hypertension: Secondary | ICD-10-CM | POA: Diagnosis not present

## 2022-06-04 DIAGNOSIS — Z09 Encounter for follow-up examination after completed treatment for conditions other than malignant neoplasm: Secondary | ICD-10-CM | POA: Diagnosis not present

## 2022-06-04 DIAGNOSIS — E119 Type 2 diabetes mellitus without complications: Secondary | ICD-10-CM | POA: Diagnosis not present

## 2022-06-04 MED ORDER — SODIUM CHLORIDE 0.9 % IV SOLN: 500.0000 mL | Freq: Once | INTRAVENOUS | Status: DC

## 2022-06-04 NOTE — Patient Instructions (Signed)
Resume previous diet and medications. Repeat Colonoscopy in 6-12 months due to poor bowel prep. Restart blood thinner tomorrow on 06/05/22.  YOU HAD AN ENDOSCOPIC PROCEDURE TODAY AT [facility]:   Refer to the procedure report that was given to you for any specific questions about what was found during the examination.  If the procedure report does not answer your questions, please call your gastroenterologist to clarify.  If you requested that your care partner not be given the details of your procedure findings, then the procedure report has been included in a sealed envelope for you to review at your convenience later.  YOU SHOULD EXPECT: Some feelings of bloating in the abdomen. Passage of more gas than usual.  Walking can help get rid of the air that was put into your GI tract during the procedure and reduce the bloating. If you had a lower endoscopy (such as a colonoscopy or flexible sigmoidoscopy) you may notice spotting of blood in your stool or on the toilet paper. If you underwent a bowel prep for your procedure, you may not have a normal bowel movement for a few days.  Please Note:  You might notice some irritation and congestion in your nose or some drainage.  This is from the oxygen used during your procedure.  There is no need for concern and it should clear up in a day or so.  SYMPTOMS TO REPORT IMMEDIATELY:  Following lower endoscopy (colonoscopy or flexible sigmoidoscopy):  Excessive amounts of blood in the stool  Significant tenderness or worsening of abdominal pains  Swelling of the abdomen that is new, acute  Fever of 100F or higher  For urgent or emergent issues, a gastroenterologist can be reached at any hour by calling [phone]. Do not use MyChart messaging for urgent concerns.    DIET:  We do recommend a small meal at first, but then you may proceed to your regular diet.  Drink plenty of fluids but you should avoid alcoholic beverages for 24  hours.  ACTIVITY:  You should plan to take it easy for the rest of today and you should NOT DRIVE or use heavy machinery until tomorrow (because of the sedation medicines used during the test).    FOLLOW UP: Our staff will call the number listed on your records the next business day following your procedure.  We will call around 7:15- 8:00 am to check on you and address any questions or concerns that you may have regarding the information given to you following your procedure. If we do not reach you, we will leave a message.  If you develop any symptoms (ie: fever, flu-like symptoms, shortness of breath, cough etc.) before then, please call [phone].  If you test positive for Covid 19 in the 2 weeks post procedure, please call and report this information to [place].    If any biopsies were taken you will be contacted by phone or by letter within the next 1-3 weeks.  Please call us at [phone] if you have not heard about the biopsies in 3 weeks.    SIGNATURES/CONFIDENTIALITY: You and/or your care partner have signed paperwork which will be entered into your electronic medical record.  These signatures attest to the fact that that the information above on your After Visit Summary has been reviewed and is understood.  Full responsibility of the confidentiality of this discharge information lies with you and/or your care-partner.

## 2022-06-04 NOTE — Progress Notes (Signed)
Report given to PACU, vss 

## 2022-06-04 NOTE — Progress Notes (Signed)
History and Physical Interval Note: Patient seen on 05/19/22 - no interval changes. Here for colonoscopy for history of numerous polyps in the past. Has been off Xarelto for 2 days for this exam. No complaints otherwise today. Feels well.  06/04/2022 10:45 AM  Charles Parker  has presented today for endoscopic procedure(s), with the diagnosis of  Encounter Diagnosis  Name Primary?   Personal history of colonic polyps Yes  .  The various methods of evaluation and treatment have been discussed with the patient and/or family. After consideration of risks, benefits and other options for treatment, the patient has consented to  the endoscopic procedure(s).   The patient's history has been reviewed, patient examined, no change in status, stable for surgery.  I have reviewed the patient's chart and labs.  Questions were answered to the patient's satisfaction.    Jolly Mango, MD [facility]

## 2022-06-04 NOTE — Progress Notes (Signed)
Pt's states no medical or surgical changes since previsit or office visit. 

## 2022-06-04 NOTE — Op Note (Addendum)
[facility] Patient Name: Charles Parker Procedure Date: 06/04/2022 10:47 AM MRN: [id number] Endoscopist: Remo Lipps P. Havery Moros , MD Age: 76 Referring MD:  Date of Birth: 1946-09-12 Gender: Male Account #: [id number] Procedure:                Colonoscopy Indications:              High risk colon cancer surveillance: Personal                            history of colonic polyps - history of numerous                            polyps in the past - last exam 2020 with 9 polyps                            removed. On Xarelto. Has required double preps in                            the past for his exams, double prep used for this                            exam. Medicines:                Monitored Anesthesia Care Procedure:                Pre-Anesthesia Assessment:                           - Prior to the procedure, a History and Physical                            was performed, and patient medications and                            allergies were reviewed. The patient's tolerance of                            previous anesthesia was also reviewed. The risks                            and benefits of the procedure and the sedation                            options and risks were discussed with the patient.                            All questions were answered, and informed consent                            was obtained. Prior Anticoagulants: The patient has                            taken Xarelto (rivaroxaban), last dose was 2 days  prior to procedure. ASA Grade Assessment: II - A                            patient with mild systemic disease. After reviewing                            the risks and benefits, the patient was deemed in                            satisfactory condition to undergo the procedure.                           After obtaining informed consent, the colonoscope                            was passed under direct vision. Throughout  the                            procedure, the patient's blood pressure, pulse, and                            oxygen saturations were monitored continuously. The                            Olympus CF-HQ190L (Serial# [id number]) Colonoscope was                            introduced through the anus and advanced to the the                            cecum, identified by appendiceal orifice and                            ileocecal valve. The colonoscopy was performed                            without difficulty. The patient tolerated the                            procedure well. The quality of the bowel                            preparation was inadequate. The ileocecal valve,                            appendiceal orifice, and rectum were photographed. Scope In: 10:52:09 AM Scope Out: 11:03:47 AM Scope Withdrawal Time: 0 hours 9 minutes 1 second  Total Procedure Duration: 0 hours 11 minutes 38 seconds  Findings:                 The perianal and digital rectal examinations were                            normal.  There was evidence of a prior end-to-end                            colo-colonic anastomosis in the rectum. This was                            patent and was characterized by healthy appearing                            mucosa.                           Scattered small-mouthed diverticula were found in                            the entire colon.                           A large amount of semi-liquid stool was found in                            the entire colon, making visualization difficult.                            Cecum and dependant portions of the colon were most                            effected, while some segments were clear. Could not                            clear the cecum and some other areas with dependant                            stool, but no obvious large polyps or mass lesions                            noted. Lavage of the colon  was performed using                            copious amounts of sterile water, resulting in                            incomplete clearance with fair visualization in                            some areas.                           Internal hemorrhoids were found during                            retroflexion. The hemorrhoids were small.                           The exam was  otherwise without abnormality of what                            was visualized. No polyps Complications:            No immediate complications. Estimated blood loss:                            None. Estimated Blood Loss:     Estimated blood loss: none. Impression:               - Preparation of the colon was inadequate as                            outlined above.                           - Patent end-to-end colo-colonic anastomosis,                            characterized by healthy appearing mucosa.                           - Diverticulosis in the entire examined colon.                           - Internal hemorrhoids.                           - The examination was otherwise normal.                           - No polyps however could not clear some portions                            of the colon - no obvious large polyps or mass                            lesions noted. Recommendation:           - Patient has a contact number available for                            emergencies. The signs and symptoms of potential                            delayed complications were discussed with the                            patient. Return to normal activities tomorrow.                            Written discharge instructions were provided to the                            patient.                           -  Resume previous diet.                           - Continue present medications.                           - Resume Xarelto today                           - Repeat colonoscopy because the bowel preparation                             was suboptimal in the next 6-12 months, will                            discuss with the patient, if he is willing to do                            this again given he will require a very aggressive                            bowel prep Remo Lipps P. Slater Mcmanaman, MD 06/04/2022 11:10:50 AM This report has been signed electronically.

## 2022-06-08 ENCOUNTER — Telehealth: Payer: Medicare Other

## 2022-06-08 ENCOUNTER — Telehealth: Payer: Self-pay

## 2022-06-08 NOTE — Telephone Encounter (Signed)
  Follow up Call-     06/04/2022   10:01 AM  Call back number  Post procedure Call Back phone  # [phone]  Permission to leave phone message Yes    Post op call attempted, no answer, left WM.

## 2022-06-09 ENCOUNTER — Telehealth: Payer: Medicare Other

## 2022-08-17 ENCOUNTER — Telehealth: Payer: Self-pay

## 2022-08-17 ENCOUNTER — Other Ambulatory Visit: Payer: Self-pay | Admitting: Internal Medicine

## 2022-08-17 ENCOUNTER — Ambulatory Visit (INDEPENDENT_AMBULATORY_CARE_PROVIDER_SITE_OTHER): Payer: Medicare Other

## 2022-08-17 VITALS — BP 118/70 | HR 68 | Temp 97.9°F | Resp 16 | Ht 71.5 in | Wt 187.0 lb

## 2022-08-17 DIAGNOSIS — R32 Unspecified urinary incontinence: Secondary | ICD-10-CM

## 2022-08-17 DIAGNOSIS — N3281 Overactive bladder: Secondary | ICD-10-CM

## 2022-08-17 DIAGNOSIS — Z0000 Encounter for general adult medical examination without abnormal findings: Secondary | ICD-10-CM

## 2022-08-17 NOTE — Telephone Encounter (Signed)
Patient would like to be referred to a specialist to evaluate OAB and urine incontinence.. Patient is having to wear 2 adult Depends for excessive urine.

## 2022-08-17 NOTE — Patient Instructions (Addendum)
[name] , Thank you for taking time to come for your Medicare Wellness Visit. I appreciate your ongoing commitment to your health goals. Please review the following plan we discussed and let me know if I can assist you in the future.   These are the goals we discussed:  Goals      (THN)Monitor and Manage My Blood Sugar     Timeframe:  Long-Range Goal Priority:  High Start Date:   08/17/2022                         Expected End Date:  08/18/2023              - check blood sugar at prescribed times - check blood sugar if I feel it is too high or too low - enter blood sugar readings and medication or insulin into daily log - take the blood sugar log to all doctor visits - take the blood sugar meter to all doctor visits    Why is this important?   Checking your blood sugar at home helps to keep it from getting very high or very low.  Writing the results in a diary or log helps the doctor know how to care for you.  Your blood sugar log should have the time, date and the results.  Also, write down the amount of insulin or other medicine that you take.  Other information, like what you ate, exercise done and how you were feeling, will also be helpful.     Notes:  Patient ran out of strip. Prescription sent in. Patient stated he was unable to afford and did not make anyone aware. Have not checked blood sugars in over a month 10/21/2020 FBS 150 Patient is being transferred to the Chronic Care Management program with provider office.      [facility] Eye Exam     Timeframe:  Long-Range Goal Priority:  Medium Start Date:     08/17/2022                     Expected End Date:   08/18/2023   - keep appointment with eye doctor - schedule appointment with eye doctor    Why is this important?   Eye check-ups are important when you have diabetes.  Vision loss can be prevented.    Notes:  Last eye exam 11/12/2019 [id number] Patient is being transferred to the Chronic Care Management  program with provider office.      (THN)Perform Foot Care     Timeframe:  Long-Range Goal Priority:  Medium Start Date:   08/17/2022                    Expected End Date:   08/18/2023    - check feet daily for cuts, sores or redness - trim toenails straight across - wash and dry feet carefully every day - wear comfortable, cotton socks - wear comfortable, well-fitting shoes    Why is this important?   Good foot care is very important when you have diabetes.  There are many things you can do to keep your feet healthy and catch a problem early.        Manage My Medicine     Timeframe:  Long-Range Goal Priority:  High Start Date:    06/02/21  Expected End Date:      11/30/2022                Follow Up Date June 2023   - call for medicine refill 2 or 3 days before it runs out - call if I am sick and can't take my medicine - keep a list of all the medicines I take; vitamins and herbals too    Why is this important?   These steps will help you keep on track with your medicines.   Notes:         This is a list of the screening recommended for you and due dates:  Health Maintenance  Topic Date Due   Zoster (Shingles) Vaccine (2 of 2) 09/11/2019   COVID-19 Vaccine (4 - Pfizer series) 09/13/2020   Flu Shot  05/04/2022   Medicare Annual Wellness Visit  05/07/2022   Hemoglobin A1C  11/11/2022   Complete foot exam   12/18/2022   Eye exam for diabetics  01/27/2023   Yearly kidney health urinalysis for diabetes  02/24/2023   Yearly kidney function blood test for diabetes  05/12/2023   Tetanus Vaccine  06/21/2023   Colon Cancer Screening  06/04/2025   Pneumonia Vaccine  Completed   Hepatitis C Screening: USPSTF Recommendation to screen - Ages 25-79 yo.  Completed   HPV Vaccine  Aged Out    Advanced directives: Yes  Conditions/risks identified: Yes; Type II Diabetes Mellitus, Overactive Bladder  Next appointment: Follow up in one year for your  annual wellness visit.   Preventive Care 14 Years and Older, Male  Preventive care refers to lifestyle choices and visits with your health care provider that can promote health and wellness. What does preventive care include? A yearly physical exam. This is also called an annual well check. Dental exams once or twice a year. Routine eye exams. Ask your health care provider how often you should have your eyes checked. Personal lifestyle choices, including: Daily care of your teeth and gums. Regular physical activity. Eating a healthy diet. Avoiding tobacco and drug use. Limiting alcohol use. Practicing safe sex. Taking low doses of aspirin every day. Taking vitamin and mineral supplements as recommended by your health care provider. What happens during an annual well check? The services and screenings done by your health care provider during your annual well check will depend on your age, overall health, lifestyle risk factors, and family history of disease. Counseling  Your health care provider may ask you questions about your: Alcohol use. Tobacco use. Drug use. Emotional well-being. Home and relationship well-being. Sexual activity. Eating habits. History of falls. Memory and ability to understand (cognition). Work and work Statistician. Screening  You may have the following tests or measurements: Height, weight, and BMI. Blood pressure. Lipid and cholesterol levels. These may be checked every 5 years, or more frequently if you are over 69 years old. Skin check. Lung cancer screening. You may have this screening every year starting at age 68 if you have a 30-pack-year history of smoking and currently smoke or have quit within the past 15 years. Fecal occult blood test (FOBT) of the stool. You may have this test every year starting at age 25. Flexible sigmoidoscopy or colonoscopy. You may have a sigmoidoscopy every 5 years or a colonoscopy every 10 years starting at age  16. Prostate cancer screening. Recommendations will vary depending on your family history and other risks. Hepatitis C blood test. Hepatitis B blood test. Sexually transmitted disease (  STD) testing. Diabetes screening. This is done by checking your blood sugar (glucose) after you have not eaten for a while (fasting). You may have this done every 1-3 years. Abdominal aortic aneurysm (AAA) screening. You may need this if you are a current or former smoker. Osteoporosis. You may be screened starting at age 63 if you are at high risk. Talk with your health care provider about your test results, treatment options, and if necessary, the need for more tests. Vaccines  Your health care provider may recommend certain vaccines, such as: Influenza vaccine. This is recommended every year. Tetanus, diphtheria, and acellular pertussis (Tdap, Td) vaccine. You may need a Td booster every 10 years. Zoster vaccine. You may need this after age 31. Pneumococcal 13-valent conjugate (PCV13) vaccine. One dose is recommended after age 36. Pneumococcal polysaccharide (PPSV23) vaccine. One dose is recommended after age 45. Talk to your health care provider about which screenings and vaccines you need and how often you need them. This information is not intended to replace advice given to you by your health care provider. Make sure you discuss any questions you have with your health care provider. Document Released: 10/17/2015 Document Revised: 06/09/2016 Document Reviewed: 07/22/2015 Elsevier Interactive Patient Education  2017 [place] Prevention in the Home Falls can cause injuries. They can happen to people of all ages. There are many things you can do to make your home safe and to help prevent falls. What can I do on the outside of my home? Regularly fix the edges of walkways and driveways and fix any cracks. Remove anything that might make you trip as you walk through a door, such as a raised step or  threshold. Trim any bushes or trees on the path to your home. Use bright outdoor lighting. Clear any walking paths of anything that might make someone trip, such as rocks or tools. Regularly check to see if handrails are loose or broken. Make sure that both sides of any steps have handrails. Any raised decks and porches should have guardrails on the edges. Have any leaves, snow, or ice cleared regularly. Use sand or salt on walking paths during winter. Clean up any spills in your garage right away. This includes oil or grease spills. What can I do in the bathroom? Use night lights. Install grab bars by the toilet and in the tub and shower. Do not use towel bars as grab bars. Use non-skid mats or decals in the tub or shower. If you need to sit down in the shower, use a plastic, non-slip stool. Keep the floor dry. Clean up any water that spills on the floor as soon as it happens. Remove soap buildup in the tub or shower regularly. Attach bath mats securely with double-sided non-slip rug tape. Do not have throw rugs and other things on the floor that can make you trip. What can I do in the bedroom? Use night lights. Make sure that you have a light by your bed that is easy to reach. Do not use any sheets or blankets that are too big for your bed. They should not hang down onto the floor. Have a firm chair that has side arms. You can use this for support while you get dressed. Do not have throw rugs and other things on the floor that can make you trip. What can I do in the kitchen? Clean up any spills right away. Avoid walking on wet floors. Keep items that you use a lot in  easy-to-reach places. If you need to reach something above you, use a strong step stool that has a grab bar. Keep electrical cords out of the way. Do not use floor polish or wax that makes floors slippery. If you must use wax, use non-skid floor wax. Do not have throw rugs and other things on the floor that can make you  trip. What can I do with my stairs? Do not leave any items on the stairs. Make sure that there are handrails on both sides of the stairs and use them. Fix handrails that are broken or loose. Make sure that handrails are as long as the stairways. Check any carpeting to make sure that it is firmly attached to the stairs. Fix any carpet that is loose or worn. Avoid having throw rugs at the top or bottom of the stairs. If you do have throw rugs, attach them to the floor with carpet tape. Make sure that you have a light switch at the top of the stairs and the bottom of the stairs. If you do not have them, ask someone to add them for you. What else can I do to help prevent falls? Wear shoes that: Do not have high heels. Have rubber bottoms. Are comfortable and fit you well. Are closed at the toe. Do not wear sandals. If you use a stepladder: Make sure that it is fully opened. Do not climb a closed stepladder. Make sure that both sides of the stepladder are locked into place. Ask someone to hold it for you, if possible. Clearly mark and make sure that you can see: Any grab bars or handrails. First and last steps. Where the edge of each step is. Use tools that help you move around (mobility aids) if they are needed. These include: Canes. Walkers. Scooters. Crutches. Turn on the lights when you go into a dark area. Replace any light bulbs as soon as they burn out. Set up your furniture so you have a clear path. Avoid moving your furniture around. If any of your floors are uneven, fix them. If there are any pets around you, be aware of where they are. Review your medicines with your doctor. Some medicines can make you feel dizzy. This can increase your chance of falling. Ask your doctor what other things that you can do to help prevent falls. This information is not intended to replace advice given to you by your health care provider. Make sure you discuss any questions you have with your  health care provider. Document Released: 07/17/2009 Document Revised: 02/26/2016 Document Reviewed: 10/25/2014 Elsevier Interactive Patient Education  2017 [employer].

## 2022-08-17 NOTE — Progress Notes (Signed)
Subjective:   Charles Parker is a 76 y.o. male who presents for Medicare Annual/Subsequent preventive examination.  Review of Systems     Cardiac Risk Factors include: advanced age (>4mn, >>57women);diabetes mellitus;dyslipidemia;family history of premature cardiovascular disease;hypertension;male gender     Objective:    Today's Vitals   08/17/22 1341  BP: 118/70  Pulse: 68  Resp: 16  Temp: 97.9 F (36.6 C)  TempSrc: Temporal  SpO2: 99%  Weight: 187 lb (84.8 kg)  Height: 5' 11.5" (1.816 m)  PainSc: 0-No pain   Body mass index is 25.72 kg/m.     08/17/2022    1:47 PM 05/07/2021   12:46 PM 11/12/2020    2:27 PM 05/24/2019    3:14 PM 04/18/2019    5:21 PM 03/30/2019   12:27 PM 01/04/2018    3:25 PM  Advanced Directives  Does Patient Have a Medical Advance Directive? Yes Yes Yes Yes No No No  Type of AParamedicof [place] will Living will       Does patient want to make changes to medical advance directive?  No - Patient declined No - Patient declined      Copy of HBullardin Chart? No - copy requested        Would patient like information on creating a medical advance directive?     No - Patient declined No - Patient declined Yes (ED - Information included in AVS)    Current Medications (verified) Outpatient Encounter Medications as of 08/17/2022  Medication Sig   Alcohol Swabs PADS Use to clean area before insulin injections. DX E11.8   blood glucose meter kit and supplies KIT 1 each by Does not apply route daily as needed. Accu-Chek Aviva preferred by patient. Use up to four times daily as directed. (FOR ICD-9 250.00, 250.01).   Cholecalciferol 50 MCG (2000 UT) TABS Take 1 tablet (2,000 Units total) by mouth daily.   Continuous Blood Gluc Receiver (FREESTYLE LIBRE 2 READER) DEVI 1 Act by Does not apply route daily.   Continuous Blood Gluc Sensor (FREESTYLE LIBRE 2 SENSOR) MISC 1 Act by Does not apply route daily.    FARXIGA 10 MG TABS tablet TAKE 1 TABLET BY MOUTH DAILY   gabapentin (NEURONTIN) 300 MG capsule TAKE 1 CAPSULE(300 MG) BY MOUTH TWICE DAILY (Patient taking differently: Take 300 mg by mouth daily. TAKE 1 CAPSULE(300 MG) BY MOUTH TWICE DAILY)   glimepiride (AMARYL) 4 MG tablet TAKE 1 TABLET BY MOUTH DAILY  BEFORE BREAKFAST   Glucagon (GVOKE HYPOPEN 2-PACK) 1 MG/0.2ML SOAJ Inject 1 Act into the skin daily as needed.   glucose blood test strip Use Accu Chek Aviva Test Strips as instructed to check blood sugar three times daily.   Insulin Lispro Prot & Lispro (HUMALOG MIX 75/25 KWIKPEN) (75-25) 100 UNIT/ML Kwikpen Inject 36 Units into the skin in the morning and at bedtime. (Patient taking differently: Inject 36 Units into the skin in the morning and at bedtime. 36 in morning and 30 at night)   Insulin Pen Needle (B-D UF III MINI PEN NEEDLES) 31G X 5 MM MISC Inject 1 Act into the skin 2 (two) times daily. ADMINISTER 30 UNITS EVERY EVENING   ketoconazole (NIZORAL) 2 % cream Apply 1 application. topically 2 (two) times daily.   losartan (COZAAR) 25 MG tablet Take 1 tablet (25 mg total) by mouth daily.   pioglitazone (ACTOS) 45 MG tablet Take 1 tablet (45 mg total) by  mouth daily.   rivaroxaban (XARELTO) 10 MG TABS tablet Take 1 tablet (10 mg total) by mouth daily.   simvastatin (ZOCOR) 40 MG tablet Take 1 tablet (40 mg total) by mouth at bedtime.   solifenacin (VESICARE) 10 MG tablet Take 1 tablet (10 mg total) by mouth daily.   No facility-administered encounter medications on file as of 08/17/2022.    Allergies (verified) Metformin and related and Codeine   History: Past Medical History:  Diagnosis Date   Clotting disorder ([place])    DVT   Diabetes mellitus    type II   Diverticulosis    DVT (deep venous thrombosis) (HCC)    Glaucoma    no eye drops    Hepatic cyst    Hypercholesterolemia    Hypertension    Renal cyst    Sleep apnea    Tubulovillous adenoma    Past Surgical History:   Procedure Laterality Date   BLADDER NECK RECONSTRUCTION  02/03/2012   Procedure: BLADDER NECK REPAIR;  Surgeon: Adin Hector, MD;  Location: [facility];  Service: General;;   COLONOSCOPY  02/2013   polyp removal(mult.)   KNEE SURGERY  2004   left   POLYPECTOMY     PROCTOSCOPY  02/03/2012   Procedure: PROCTOSCOPY;  Surgeon: Adin Hector, MD;  Location: [facility];  Service: General;;   STOMACH SURGERY  02/2012   TEE WITHOUT CARDIOVERSION N/A 10/05/2016   Procedure: TRANSESOPHAGEAL ECHOCARDIOGRAM (TEE);  Surgeon: Sanda Klein, MD;  Location: [facility] END[facility]COPY;  Service: Cardiovascular;  Laterality: N/A;   Family History  Problem Relation Age of Onset   Hypertension Mother    Diabetes Mother    Cancer Mother        ? location   Colon cancer Father 77   Colon polyps Neg Hx    Liver cancer Neg Hx    Social History   Socioeconomic History   Marital status: Widowed    Spouse name: Not on file   Number of children: 2   Years of education: 71   Highest education level: Not on file  Occupational History   Occupation: retired    Comment: coal mills  Tobacco Use   Smoking status: Never   Smokeless tobacco: Never  Scientific laboratory technician Use: Never used  Substance and Sexual Activity   Alcohol use: No    Alcohol/week: 0.0 standard drinks of alcohol    Comment: once every two months   Drug use: No   Sexual activity: Not Currently  Other Topics Concern   Not on file  Social History Narrative   Patient lives at home alone.. Patient is retired. Patient has high school education.   Right handed.- Both   Caffeine- Coffee and soda   Social Determinants of Health   Financial Resource Strain: Low Risk  (08/17/2022)   Overall Financial Resource Strain (CARDIA)    Difficulty of Paying Living Expenses: Not hard at all  Food Insecurity: No Food Insecurity (08/17/2022)   Hunger Vital Sign    Worried About Running Out of Food in the Last Year: Never true    Ran Out of Food in the Last Year: Never  true  Transportation Needs: No Transportation Needs (08/17/2022)   PRAPARE - Hydrologist (Medical): No    Lack of Transportation (Non-Medical): No  Physical Activity: Sufficiently Active (08/17/2022)   Exercise Vital Sign    Days of Exercise per Week: 7 days    Minutes of  Exercise per Session: 30 min  Stress: No Stress Concern Present (08/17/2022)   [place]    Feeling of Stress : Not at all  Social Connections: Moderately Integrated (08/17/2022)   Social Connection and Isolation Panel [NHANES]    Frequency of Communication with Friends and Family: More than three times a week    Frequency of Social Gatherings with Friends and Family: More than three times a week    Attends Religious Services: More than 4 times per year    Active Member of [employer] or Organizations: Yes    Attends Archivist Meetings: More than 4 times per year    Marital Status: Widowed    Tobacco Counseling Counseling given: Not Answered   Clinical Intake:  Pre-visit preparation completed: Yes  Pain : No/denies pain Pain Score: 0-No pain     BMI - recorded: 25.72 Nutritional Status: BMI 25 -29 Overweight Nutritional Risks: None Diabetes: Yes CBG done?: No Did pt. bring in CBG monitor from home?: No  How often do you need to have someone help you when you read instructions, pamphlets, or other written materials from your doctor or pharmacy?: 1 - Never What is the last grade level you completed in school?: HSG  Nutrition Risk Assessment:  Has the patient had any N/V/D within the last 2 months?  No  Does the patient have any non-healing wounds?  No  Has the patient had any unintentional weight loss or weight gain?  No   Diabetes:  Is the patient diabetic?  Yes  If diabetic, was a CBG obtained today?  No  Did the patient bring in their glucometer from home?  No  How often do you monitor your  CBG's? CGM (Freestyle Libre)  Financial Strains and Diabetes Management:  Are you having any financial strains with the device, your supplies or your medication? No .  Does the patient want to be seen by Chronic Care Management for management of their diabetes?  No  Would the patient like to be referred to a Nutritionist or for Diabetic Management?  No   Diabetic Exams:  Diabetic Eye Exam: Completed 01/26/2022 Diabetic Foot Exam: Completed 12/17/2021   Interpreter Needed?: No  Information entered by :: Lisette Abu, LPN.   Activities of Daily Living    08/17/2022    1:48 PM  In your present state of health, do you have any difficulty performing the following activities:  Hearing? 0  Vision? 0  Difficulty concentrating or making decisions? 0  Walking or climbing stairs? 1  Dressing or bathing? 0  Doing errands, shopping? 0  Preparing Food and eating ? N  Using the Toilet? N  In the past six months, have you accidently leaked urine? Y  Do you have problems with loss of bowel control? N  Managing your Medications? N  Managing your Finances? N  Housekeeping or managing your Housekeeping? N    Patient Care Team: Janith Lima, MD as PCP - General (Internal Medicine) Wilford Corner, MD as Consulting Physician (Gastroenterology) Philemon Kingdom, MD as Consulting Physician (Internal Medicine) Gardiner Barefoot, DPM as Consulting Physician (Podiatry) Warden Fillers, MD as Consulting Physician (Ophthalmology) Szabat, Darnelle Maffucci, [facility] (Inactive) (Pharmacist)  Indicate any recent Medical Services you may have received from other than [facility] providers in the past year (date may be approximate).     Assessment:   This is a routine wellness examination for Kebin.  Hearing/Vision screen Hearing Screening - Comments::  Denies hearing difficulties   Vision Screening - Comments:: Wears rx glasses - up to date with routine eye exams with [place] [facility]   Dietary issues and  exercise activities discussed: Current Exercise Habits: Home exercise routine, Type of exercise: walking, Time (Minutes): 30, Frequency (Times/Week): 7, Weekly Exercise (Minutes/Week): 210, Exercise limited by: orthopedic condition(s)   Goals Addressed             This Visit's Progress    (THN)Monitor and Manage My Blood Sugar       Timeframe:  Long-Range Goal Priority:  High Start Date:   08/17/2022                         Expected End Date:  08/18/2023              - check blood sugar at prescribed times - check blood sugar if I feel it is too high or too low - enter blood sugar readings and medication or insulin into daily log - take the blood sugar log to all doctor visits - take the blood sugar meter to all doctor visits    Why is this important?   Checking your blood sugar at home helps to keep it from getting very high or very low.  Writing the results in a diary or log helps the doctor know how to care for you.  Your blood sugar log should have the time, date and the results.  Also, write down the amount of insulin or other medicine that you take.  Other information, like what you ate, exercise done and how you were feeling, will also be helpful.     Notes:  Patient ran out of strip. Prescription sent in. Patient stated he was unable to afford and did not make anyone aware. Have not checked blood sugars in over a month 10/21/2020 FBS 150 Patient is being transferred to the Chronic Care Management program with provider office.      [facility] Eye Exam       Timeframe:  Long-Range Goal Priority:  Medium Start Date:     08/17/2022                     Expected End Date:   08/18/2023   - keep appointment with eye doctor - schedule appointment with eye doctor    Why is this important?   Eye check-ups are important when you have diabetes.  Vision loss can be prevented.    Notes:  Last eye exam 11/12/2019 [id number] Patient is being transferred to the Chronic Care  Management program with provider office.      (THN)Perform Foot Care       Timeframe:  Long-Range Goal Priority:  Medium Start Date:   08/17/2022                    Expected End Date:   08/18/2023    - check feet daily for cuts, sores or redness - trim toenails straight across - wash and dry feet carefully every day - wear comfortable, cotton socks - wear comfortable, well-fitting shoes    Why is this important?   Good foot care is very important when you have diabetes.  There are many things you can do to keep your feet healthy and catch a problem early.         Depression Screen    08/17/2022    1:46 PM 12/22/2021  1:31 PM 05/07/2021   12:56 PM 11/12/2020    2:28 PM 12/12/2019   11:43 AM 06/19/2019   10:57 AM 05/24/2019    3:13 PM  PHQ 2/9 Scores  PHQ - 2 Score 0 1 0 0 0 0 0    Fall Risk    08/17/2022    1:48 PM 05/18/2022    9:35 AM 03/08/2022    9:45 AM 01/13/2022   11:30 AM 12/25/2021    1:15 PM  Fall Risk   Falls in the past year? 0 0 0 0 0  Comment  denies new/ recent falls x 12 months; uses cane as needed Continues to deny falls x 12 months; occasionally uses cane as/ if indicated  denies falls x 12 months; uses cane regularly  Number falls in past yr: 0 0 0 0 0  Injury with Fall? 0 0 0 0 0  Comment  N/A- no falls reported N/A- no falls reported N/A- denies falls x 12 months; uses cane as needed/ indicated N/A- no falls reported  Risk for fall due to : No Fall Risks Medication side effect;Impaired mobility Medication side effect Medication side effect History of fall(s);Medication side effect  Follow up _0     FALL RISK PREVENTION PERTAINING TO THE HOME:  Any stairs in or around the home? No  If so, are there any without handrails? No  Home free of loose throw rugs in walkways, pet beds, electrical cords, etc? Yes  Adequate lighting  in your home to reduce risk of falls? Yes   ASSISTIVE DEVICES UTILIZED TO PREVENT FALLS:  Life alert? No  Use of a cane, walker or w/c? Yes  Grab bars in the bathroom? Yes  Shower chair or bench in shower? No  Elevated toilet seat or a handicapped toilet? Yes   TIMED UP AND GO:  Was the test performed? Yes .  Length of time to ambulate 10 feet: 10 sec.   Gait steady and fast with assistive device  Cognitive Function:    01/04/2018    3:28 PM 11/13/2015    9:22 AM  MMSE - Mini Mental State Exam  Orientation to time 5 5  Orientation to Place 5 5  Registration 3 3  Attention/ Calculation 3 5  Recall 1 3  Language- name 2 objects 2 2  Language- repeat 1 1  Language- follow 3 step command 3 3  Language- read & follow direction 1 1  Write a sentence 1 1  Copy design 1 1  Total score 26 30        08/17/2022    1:48 PM  6CIT Screen  What Year? 0 points  What month? 0 points  What time? 0 points  Count back from 20 0 points  Months in reverse 0 points  Repeat phrase 0 points  Total Score 0 points    Immunizations Immunization History  Administered Date(s) Administered   Fluad Quad(high Dose 65+) 07/17/2019, 07/08/2020   Influenza, High Dose Seasonal PF 06/13/2017, 07/06/2018   Influenza,inj,Quad PF,6+ Mos 06/20/2013, 07/10/2014   Influenza-Unspecified 06/05/2015, 07/26/2016, 11/04/2021   PFIZER(Purple Top)SARS-COV-2 Vaccination 10/26/2019, 11/16/2019, 07/19/2020   PPD Test 10/29/2016   Pneumococcal Conjugate-13 06/20/2013, 07/23/2014   Pneumococcal Polysaccharide-23 06/19/2015, 02/05/2021   Tdap 06/20/2013   Zoster Recombinat (Shingrix) 07/17/2019    TDAP status: Up to date  Flu Vaccine status: Due, Education has been provided regarding the importance of this  vaccine. Advised may receive this vaccine at local pharmacy or Health Dept. Aware to provide a copy of the vaccination record if obtained from local pharmacy or Health Dept. Verbalized acceptance and  understanding.  Pneumococcal vaccine status: Up to date  Covid-19 vaccine status: Completed vaccines  Qualifies for Shingles Vaccine? Yes   Zostavax completed No   Shingrix Completed?: No.    Education has been provided regarding the importance of this vaccine. Patient has been advised to call insurance company to determine out of pocket expense if they have not yet received this vaccine. Advised may also receive vaccine at local pharmacy or Health Dept. Verbalized acceptance and understanding.  Screening Tests Health Maintenance  Topic Date Due   Zoster Vaccines- Shingrix (2 of 2) 09/11/2019   COVID-19 Vaccine (4 - Pfizer series) 09/13/2020   INFLUENZA VACCINE  05/04/2022   HEMOGLOBIN A1C  11/11/2022   FOOT EXAM  12/18/2022   OPHTHALMOLOGY EXAM  01/27/2023   Diabetic kidney evaluation - Urine ACR  02/24/2023   Diabetic kidney evaluation - GFR measurement  05/12/2023   TETANUS/TDAP  06/21/2023   Medicare Annual Wellness (AWV)  08/18/2023   COLONOSCOPY (Pts 45-66yr Insurance coverage will need to be confirmed)  06/04/2025   Pneumonia Vaccine 76 Years old  Completed   Hepatitis C Screening  Completed   HPV VACCINES  Aged Out    Health Maintenance  Health Maintenance Due  Topic Date Due   Zoster Vaccines- Shingrix (2 of 2) 09/11/2019   COVID-19 Vaccine (4 - Pfizer series) 09/13/2020   INFLUENZA VACCINE  05/04/2022    Colorectal cancer screening: Type of screening: Colonoscopy. Completed 06/04/2022. Repeat every 3 years  Lung Cancer Screening: (Low Dose CT Chest recommended if Age 76-80years, 30 pack-year currently smoking OR have quit w/in 15years.) does not qualify.   Lung Cancer Screening Referral: no  Additional Screening:  Hepatitis C Screening: does qualify; Completed 06/20/2013  Vision Screening: Recommended annual ophthalmology exams for early detection of glaucoma and other disorders of the eye. Is the patient up to date with their annual eye exam?  Yes  Who is  the provider or what is the name of the office in which the patient attends annual [facility] exams[facility] If pt is not established with a provider, would they like to be referred to a provider to establish care? No .   Dental Screening: Recommended annual dental exams for proper oral hygiene  Community Resource Referral / Chronic Care Management: CRR required this visit?  No   CCM required this visit?  No      Plan:     I have personally reviewed and noted the following in the patient's chart:   Medical and social history Use of alcohol, tobacco or illicit drugs  Current medications and supplements including opioid prescriptions. Patient is not currently taking opioid prescriptions. Functional ability and status Nutritional status Physical activity Advanced directives List of other physicians Hospitalizations, surgeries, and ER visits in previous 12 months Vitals Screenings to include cognitive, depression, and falls Referrals and appointments  In addition, I have reviewed and discussed with patient certain preventive protocols, quality metrics, and best practice recommendations. A written personalized care plan for preventive services as well as general preventive health recommendations were provided to patient.     SSheral Flow LPN   [id number]  Nurse Notes: N/A

## 2022-09-03 ENCOUNTER — Other Ambulatory Visit (INDEPENDENT_AMBULATORY_CARE_PROVIDER_SITE_OTHER): Payer: Medicare Other

## 2022-09-03 DIAGNOSIS — Z794 Long term (current) use of insulin: Secondary | ICD-10-CM | POA: Diagnosis not present

## 2022-09-03 DIAGNOSIS — E1165 Type 2 diabetes mellitus with hyperglycemia: Secondary | ICD-10-CM

## 2022-09-03 LAB — BASIC METABOLIC PANEL
BUN: 13 mg/dL (ref 6–23)
CO2: 27 mEq/L (ref 19–32)
Calcium: 9.2 mg/dL (ref 8.4–10.5)
Chloride: 100 mEq/L (ref 96–112)
Creatinine, Ser: 1.07 mg/dL (ref 0.40–1.50)
GFR: 67.41 mL/min (ref >60.00)
Glucose, Bld: 305 mg/dL — ABNORMAL HIGH (ref 70–99)
Potassium: 4 mEq/L (ref 3.5–5.1)
Sodium: 137 mEq/L (ref 135–145)

## 2022-09-03 LAB — HEMOGLOBIN A1C: Hgb A1c MFr Bld: 10.2 % — ABNORMAL HIGH (ref 4.6–6.5)

## 2022-09-06 ENCOUNTER — Other Ambulatory Visit: Payer: Self-pay | Admitting: Internal Medicine

## 2022-09-07 ENCOUNTER — Ambulatory Visit (INDEPENDENT_AMBULATORY_CARE_PROVIDER_SITE_OTHER): Payer: Medicare Other | Admitting: Endocrinology

## 2022-09-07 ENCOUNTER — Encounter: Payer: Self-pay | Admitting: Endocrinology

## 2022-09-07 VITALS — BP 130/74 | HR 76 | Ht 71.5 in | Wt 186.0 lb

## 2022-09-07 DIAGNOSIS — E1165 Type 2 diabetes mellitus with hyperglycemia: Secondary | ICD-10-CM

## 2022-09-07 DIAGNOSIS — E7800 Pure hypercholesterolemia, unspecified: Secondary | ICD-10-CM | POA: Diagnosis not present

## 2022-09-07 DIAGNOSIS — Z794 Long term (current) use of insulin: Secondary | ICD-10-CM | POA: Diagnosis not present

## 2022-09-07 NOTE — Patient Instructions (Addendum)
Cereal only 2x per week  Check blood sugars on waking up 4 days a week  Also check blood sugars about 2 hours after meals and do this after different meals by rotation  Recommended blood sugar levels on waking up are 90-130 and about 2 hours after meal is 130-180  Please bring your blood sugar monitor to each visit, thank you  Stay on 46 units 2x daily

## 2022-09-07 NOTE — Progress Notes (Signed)
Patient ID: Charles Parker, male   DOB: [birth year], 76 y.o.   MRN: [id number]          Reason for Appointment: Follow-up for Type 2 Diabetes  Referring physician: Scarlette Calico   History of Present Illness:          Date of diagnosis of type 2 diabetes mellitus: 2014        Background history:   He has been on various oral hypoglycemic drugs since the onset including metformin, Januvia and Amaryl Was also on Invokana in 2015 for about a year but not clear why this was stopped His level of control has been quite variable with highest A1c 13.6 in 06/2015  He has been on insulin since about 10/2014 as documented in his record  Recent history:   INSULIN regimen is:  NovoLog mix 46 units twice daily  Non-insulin hypoglycemic drugs the patient is taking BSW:HQPRFFM 10 mg daily  His A1c is back up to 10.2 from 8.4  Fructosamine last 291, previously at 417   Current management, blood sugar patterns and problems identified: He has not checked his blood sugar in about a month  He says that he was out of insulin for about a week when he was out of town  Although he thinks he is taking his insulin twice a day before meals his blood sugars are much higher and he cannot explain why However he thinks at times he will be eating more sweets  Also instead of a balanced meal he is eating Cheerios for breakfast in the morning  Blood sugar was over 300 when he came in for his labs after eating Cheerios He has not been able to use a freestyle libre sensor, not clear if there was some issues with coverage He says he is taking 46 units although previously was recommended 40 twice a day but still does not have hypoglycemia As before he states is taking his Wilder Glade regularly   Side effects from medications have been: Hives from metformin  Compliance with the medical regimen: Inconsistent  Glucose monitoring:  done< 1 times a   day usually        Glucometer: Accu-Chek guide       Blood Glucose  readings as below, AVERAGE 162 for 30 days  FASTING range 104-179  Afternoon 178, bedtime 119   Self-care: The diet that the patient has been following is: tries to limit [employer] .      Typical meal intake: Breakfast is eggs/cereal or oatmeal usually, lunch:  Lean cuisine or sandwiches.  Dinner chicken and vegetables.   Snacks will be chips, peanut butter crackers Supper at 6 pm                Dietician visit, most recent:3/19              Weight history:  Wt Readings from Last 3 Encounters:  09/07/22 186 lb (84.4 kg)  08/17/22 187 lb (84.8 kg)  06/04/22 187 lb (84.8 kg)    Glycemic control:   Lab Results  Component Value Date   HGBA1C 10.2 (H) 09/03/2022   HGBA1C 8.4 (H) 05/11/2022   HGBA1C 10.6 (H) 12/14/2021   Lab Results  Component Value Date   MICROALBUR 0.7 02/23/2022   LDLCALC 111 (H) 10/09/2021   CREATININE 1.07 09/03/2022   Lab Results  Component Value Date   MICRALBCREAT 0.6 02/23/2022    Lab Results  Component Value Date   FRUCTOSAMINE 369 (H)  02/23/2022   FRUCTOSAMINE 417 (H) 10/09/2021   FRUCTOSAMINE 546 (H) 06/25/2021      Allergies as of 09/07/2022       Reactions   Metformin And Related Diarrhea   Codeine Rash   All over the body        Medication List        Accurate as of September 07, 2022  9:22 AM. If you have any questions, ask your nurse or doctor.          Alcohol Swabs Pads Use to clean area before insulin injections. DX E11.8   B-D UF III MINI PEN NEEDLES 31G X 5 MM Misc Generic drug: Insulin Pen Needle Inject 1 Act into the skin 2 (two) times daily. ADMINISTER 30 UNITS EVERY EVENING   blood glucose meter kit and supplies Kit 1 each by Does not apply route daily as needed. Accu-Chek Aviva preferred by patient. Use up to four times daily as directed. (FOR ICD-9 250.00, 250.01).   Cholecalciferol 50 MCG (2000 UT) Tabs Take 1 tablet (2,000 Units total) by mouth daily.   Farxiga 10 MG Tabs tablet Generic drug:  dapagliflozin propanediol TAKE 1 TABLET BY MOUTH DAILY   FreeStyle Libre 2 Reader Devi 1 Act by Does not apply route daily.   FreeStyle Libre 2 Sensor Misc 1 Act by Does not apply route daily.   gabapentin 300 MG capsule Commonly known as: NEURONTIN TAKE 1 CAPSULE(300 MG) BY MOUTH TWICE DAILY What changed:  how much to take how to take this when to take this   glimepiride 4 MG tablet Commonly known as: AMARYL TAKE 1 TABLET BY MOUTH DAILY  BEFORE BREAKFAST   glucose blood test strip Use Accu Chek Aviva Test Strips as instructed to check blood sugar three times daily.   Gvoke HypoPen 2-Pack 1 MG/0.2ML Soaj Generic drug: Glucagon Inject 1 Act into the skin daily as needed.   Insulin Lispro Prot & Lispro (75-25) 100 UNIT/ML Kwikpen Commonly known as: HumaLOG Mix 75/25 KwikPen Inject 36 Units into the skin in the morning and at bedtime. What changed: additional instructions   ketoconazole 2 % cream Commonly known as: NIZORAL Apply 1 application. topically 2 (two) times daily.   losartan 25 MG tablet Commonly known as: COZAAR Take 1 tablet (25 mg total) by mouth daily.   pioglitazone 45 MG tablet Commonly known as: Actos Take 1 tablet (45 mg total) by mouth daily.   rivaroxaban 10 MG Tabs tablet Commonly known as: Xarelto Take 1 tablet (10 mg total) by mouth daily.   simvastatin 40 MG tablet Commonly known as: ZOCOR Take 1 tablet (40 mg total) by mouth at bedtime.   solifenacin 10 MG tablet Commonly known as: VESICARE Take 1 tablet (10 mg total) by mouth daily.        Allergies:  Allergies  Allergen Reactions   Metformin And Related Diarrhea   Codeine Rash    All over the body    Past Medical History:  Diagnosis Date   Clotting disorder ([facility])    DVT   Diabetes mellitus    type II   Diverticulosis    DVT (deep venous thrombosis) ([facility])    Glaucoma    no eye drops    Hepatic cyst    Hypercholesterolemia    Hypertension    Renal cyst    Sleep  apnea    Tubulovillous adenoma     Past Surgical History:  Procedure Laterality Date   BLADDER NECK RECONSTRUCTION  02/03/2012   Procedure: BLADDER NECK REPAIR;  Surgeon: Adin Hector, MD;  Location: [facility];  Service: General;;   COLONOSCOPY  02/2013   polyp removal(mult.)   KNEE SURGERY  2004   left   POLYPECTOMY     PROCTOSCOPY  02/03/2012   Procedure: PROCTOSCOPY;  Surgeon: Adin Hector, MD;  Location: [facility];  Service: General;;   STOMACH SURGERY  02/2012   TEE WITHOUT CARDIOVERSION N/A 10/05/2016   Procedure: TRANSESOPHAGEAL ECHOCARDIOGRAM (TEE);  Surgeon: Sanda Klein, MD;  Location: [facility] END[facility]COPY;  Service: Cardiovascular;  Laterality: N/A;    Family History  Problem Relation Age of Onset   Hypertension Mother    Diabetes Mother    Cancer Mother        ? location   Colon cancer Father 38   Colon polyps Neg Hx    Liver cancer Neg Hx     Social History:  reports that he has never smoked. He has never used smokeless tobacco. He reports that he does not drink alcohol and does not use drugs.   Review of Systems  Lipid history: Has been taking simvastatin 40 mg with variable control, followed by PCP No recent labs available   Lab Results  Component Value Date   CHOL 194 10/09/2021   CHOL 153 08/19/2020   CHOL 202 (H) 05/12/2020   Lab Results  Component Value Date   HDL 52.10 10/09/2021   HDL 43.00 08/19/2020   HDL 50.80 05/12/2020   Lab Results  Component Value Date   LDLCALC 111 (H) 10/09/2021   LDLCALC 84 08/19/2020   LDLCALC 124 (H) 05/12/2020   Lab Results  Component Value Date   TRIG 153.0 (H) 10/09/2021   TRIG 130.0 08/19/2020   TRIG 134.0 05/12/2020   Lab Results  Component Value Date   CHOLHDL 4 10/09/2021   CHOLHDL 4 08/19/2020   CHOLHDL 4 05/12/2020   Lab Results  Component Value Date   LDLDIRECT 115.0 07/02/2019   LDLDIRECT 118.0 11/11/2015   LDLDIRECT 201.0 03/04/2015           He has history of neuropathy, not symptomatic and  not taking gabapentin prescribed by PCP  Most recent foot exam: 2/22  RENAL function is consistently normal with continuing Farxiga 10 mg  Lab Results  Component Value Date   CREATININE 1.07 09/03/2022   CREATININE 1.12 05/11/2022   CREATININE 1.17 02/23/2022    Hypertension: On losartan 25 mg prescribed by PCP  BP Readings from Last 3 Encounters:  09/07/22 130/74  08/17/22 118/70  06/04/22 121/78    Has had all 3 Covid shots  May have diabetic shoes for his foot deformities    Physical Examination:  BP 130/74   Pulse 76   Ht 5' 11.5" (1.816 m)   Wt 186 lb (84.4 kg)   SpO2 98%   BMI 25.58 kg/m       ASSESSMENT:  Diabetes type 2 on insulin  See history of present illness for detailed discussion of current diabetes management, blood sugar patterns and problems identified   He is on a regimen of premixed insulin twice a day and Farxiga  His A1c is 10.2  He is likely not taking his insulin regularly even though he thinks he is taking as much as 46 units twice a day Monitoring is suboptimal and so is his diet  PLAN:    He was referred to the diabetes educator as he needs significant diabetes education and understanding of what  he needs to do to get control of his diabetes He will need to check his blood sugars twice a day regularly, he thinks he does not have expired strips For now we will continue 46 units of insulin twice a day but have him take this regularly He will try to improve his diet with cutting back on starchy foods and sweets  May consider using freestyle libre and he will discuss further with the diabetes educator, given basic information on this today    There are no Patient Instructions on file for this visit.       Elayne Snare 09/07/2022, 9:22 AM   Note: This office note was prepared with Dragon voice recognition system technology. Any transcriptional errors that result from this process are unintentional.

## 2022-09-21 ENCOUNTER — Encounter: Payer: Self-pay | Admitting: Internal Medicine

## 2022-09-21 ENCOUNTER — Ambulatory Visit (INDEPENDENT_AMBULATORY_CARE_PROVIDER_SITE_OTHER): Payer: Medicare Other | Admitting: Internal Medicine

## 2022-09-21 VITALS — BP 128/84 | HR 87 | Temp 98.9°F | Ht 71.5 in | Wt 188.0 lb

## 2022-09-21 DIAGNOSIS — Z0001 Encounter for general adult medical examination with abnormal findings: Secondary | ICD-10-CM | POA: Diagnosis not present

## 2022-09-21 DIAGNOSIS — E118 Type 2 diabetes mellitus with unspecified complications: Secondary | ICD-10-CM

## 2022-09-21 DIAGNOSIS — R32 Unspecified urinary incontinence: Secondary | ICD-10-CM | POA: Diagnosis not present

## 2022-09-21 DIAGNOSIS — R3916 Straining to void: Secondary | ICD-10-CM

## 2022-09-21 DIAGNOSIS — I10 Essential (primary) hypertension: Secondary | ICD-10-CM | POA: Diagnosis not present

## 2022-09-21 DIAGNOSIS — Z794 Long term (current) use of insulin: Secondary | ICD-10-CM | POA: Diagnosis not present

## 2022-09-21 DIAGNOSIS — N401 Enlarged prostate with lower urinary tract symptoms: Secondary | ICD-10-CM

## 2022-09-21 DIAGNOSIS — E611 Iron deficiency: Secondary | ICD-10-CM | POA: Diagnosis not present

## 2022-09-21 DIAGNOSIS — E785 Hyperlipidemia, unspecified: Secondary | ICD-10-CM | POA: Diagnosis not present

## 2022-09-21 LAB — URINALYSIS, ROUTINE W REFLEX MICROSCOPIC
Bilirubin Urine: NEGATIVE
Hgb urine dipstick: NEGATIVE
Ketones, ur: NEGATIVE
Leukocytes,Ua: NEGATIVE
Nitrite: NEGATIVE
Specific Gravity, Urine: >=1.03 — AB (ref 1.000–1.030)
Total Protein, Urine: NEGATIVE
Urine Glucose: >=1000 — AB
Urobilinogen, UA: 0.2 (ref 0.0–1.0)
pH: 6 (ref 5.0–8.0)

## 2022-09-21 LAB — PSA: PSA: 2.22 ng/mL (ref 0.10–4.00)

## 2022-09-21 LAB — HEPATIC FUNCTION PANEL
ALT: 25 U/L (ref 0–53)
AST: 17 U/L (ref 0–37)
Albumin: 4.7 g/dL (ref 3.5–5.2)
Alkaline Phosphatase: 67 U/L (ref 39–117)
Bilirubin, Direct: 0.1 mg/dL (ref 0.0–0.3)
Total Bilirubin: 0.6 mg/dL (ref 0.2–1.2)
Total Protein: 7.7 g/dL (ref 6.0–8.3)

## 2022-09-21 LAB — CBC WITH DIFFERENTIAL/PLATELET
Basophils Absolute: 0 10*3/uL (ref 0.0–0.1)
Basophils Relative: 0.5 % (ref 0.0–3.0)
Eosinophils Absolute: 0 10*3/uL (ref 0.0–0.7)
Eosinophils Relative: 0.4 % (ref 0.0–5.0)
HCT: 48.4 % (ref 39.0–52.0)
Hemoglobin: 16.2 g/dL (ref 13.0–17.0)
Lymphocytes Relative: 33.5 % (ref 12.0–46.0)
Lymphs Abs: 1.6 10*3/uL (ref 0.7–4.0)
MCHC: 33.4 g/dL (ref 30.0–36.0)
MCV: 94.1 fl (ref 78.0–100.0)
Monocytes Absolute: 0.5 10*3/uL (ref 0.1–1.0)
Monocytes Relative: 9.5 % (ref 3.0–12.0)
Neutro Abs: 2.7 10*3/uL (ref 1.4–7.7)
Neutrophils Relative %: 56.1 % (ref 43.0–77.0)
Platelets: 196 10*3/uL (ref 150.0–400.0)
RBC: 5.14 Mil/uL (ref 4.22–5.81)
RDW: 13.3 % (ref 11.5–15.5)
WBC: 4.8 10*3/uL (ref 4.0–10.5)

## 2022-09-21 LAB — IBC + FERRITIN
Ferritin: 775 ng/mL — ABNORMAL HIGH (ref 22.0–322.0)
Iron: 80 ug/dL (ref 42–165)
Saturation Ratios: 21.2 % (ref 20.0–50.0)
TIBC: 378 ug/dL (ref 250.0–450.0)
Transferrin: 270 mg/dL (ref 212.0–360.0)

## 2022-09-21 LAB — TSH: TSH: 1.45 u[IU]/mL (ref 0.35–5.50)

## 2022-09-21 NOTE — Progress Notes (Signed)
Subjective:  Patient ID: Charles Parker, male    DOB: [birth year]  Age: 76 y.o. MRN: [id number]  CC: Annual Exam   HPI Charles Parker presents for a CPX and f/up -  He is active and denies chest pain, shortness of breath, or diaphoresis.  Outpatient Medications Prior to Visit  Medication Sig Dispense Refill   Alcohol Swabs PADS Use to clean area before insulin injections. DX E11.8 200 each 3   blood glucose meter kit and supplies KIT 1 each by Does not apply route daily as needed. Accu-Chek Aviva preferred by patient. Use up to four times daily as directed. (FOR ICD-9 250.00, 250.01).     Cholecalciferol 50 MCG (2000 UT) TABS Take 1 tablet (2,000 Units total) by mouth daily. 90 tablet 3   Continuous Blood Gluc Receiver (FREESTYLE LIBRE 2 READER) DEVI 1 Act by Does not apply route daily. 2 each 5   Continuous Blood Gluc Sensor (FREESTYLE LIBRE 2 SENSOR) MISC 1 Act by Does not apply route daily. 2 each 5   FARXIGA 10 MG TABS tablet TAKE 1 TABLET BY MOUTH DAILY 90 tablet 3   gabapentin (NEURONTIN) 300 MG capsule TAKE 1 CAPSULE(300 MG) BY MOUTH TWICE DAILY (Patient taking differently: Take 300 mg by mouth daily. TAKE 1 CAPSULE(300 MG) BY MOUTH TWICE DAILY) 180 capsule 1   glimepiride (AMARYL) 4 MG tablet TAKE 1 TABLET BY MOUTH DAILY  BEFORE BREAKFAST 100 tablet 2   Glucagon (GVOKE HYPOPEN 2-PACK) 1 MG/0.2ML SOAJ Inject 1 Act into the skin daily as needed. 2 mL 5   glucose blood test strip Use Accu Chek Aviva Test Strips as instructed to check blood sugar three times daily. 100 each 3   Insulin Lispro Prot & Lispro (HUMALOG MIX 75/25 KWIKPEN) (75-25) 100 UNIT/ML Kwikpen Inject 36 Units into the skin in the morning and at bedtime. (Patient taking differently: Inject 36 Units into the skin in the morning and at bedtime. 36 in morning and 30 at night) 66 mL 1   Insulin Pen Needle (B-D UF III MINI PEN NEEDLES) 31G X 5 MM MISC Inject 1 Act into the skin 2 (two) times daily. ADMINISTER 30 UNITS  EVERY EVENING 100 each 2   ketoconazole (NIZORAL) 2 % cream Apply 1 application. topically 2 (two) times daily. 60 g 3   losartan (COZAAR) 25 MG tablet Take 1 tablet (25 mg total) by mouth daily. 90 tablet 1   pioglitazone (ACTOS) 45 MG tablet Take 1 tablet (45 mg total) by mouth daily. 90 tablet 3   rivaroxaban (XARELTO) 10 MG TABS tablet Take 1 tablet (10 mg total) by mouth daily. 100 tablet 0   simvastatin (ZOCOR) 40 MG tablet Take 1 tablet (40 mg total) by mouth at bedtime. 100 tablet 0   solifenacin (VESICARE) 10 MG tablet Take 1 tablet (10 mg total) by mouth daily. 90 tablet 1   No facility-administered medications prior to visit.    ROS Review of Systems  Constitutional:  Negative for appetite change, diaphoresis, fatigue and unexpected weight change.  HENT: Negative.    Eyes: Negative.   Respiratory:  Negative for cough, chest tightness, shortness of breath and wheezing.   Cardiovascular: Negative.  Negative for chest pain, palpitations and leg swelling.  Gastrointestinal: Negative.  Negative for abdominal pain, constipation, diarrhea, nausea and vomiting.  Endocrine: Positive for polyuria. Negative for polydipsia and polyphagia.  Genitourinary:  Positive for frequency. Negative for difficulty urinating.  Musculoskeletal: Negative.  Negative for arthralgias  and myalgias.  Skin: Negative.  Negative for color change.  Neurological: Negative.  Negative for dizziness and weakness.  Hematological:  Negative for adenopathy. Does not bruise/bleed easily.  Psychiatric/Behavioral: Negative.      Objective:  BP 128/84 (BP Location: Left Arm, Patient Position: Sitting, Cuff Size: Large)   Pulse 87   Temp 98.9 F (37.2 C) (Oral)   Ht 5' 11.5" (1.816 m)   Wt 188 lb (85.3 kg)   SpO2 95%   BMI 25.86 kg/m   BP Readings from Last 3 Encounters:  09/21/22 128/84  09/07/22 130/74  08/17/22 118/70    Wt Readings from Last 3 Encounters:  09/21/22 188 lb (85.3 kg)  09/07/22 186 lb  (84.4 kg)  08/17/22 187 lb (84.8 kg)    Physical Exam Vitals reviewed. Exam conducted with a chaperone present.  HENT:     Mouth/Throat:     Mouth: Mucous membranes are moist.  Eyes:     General: No scleral icterus.    Conjunctiva/sclera: Conjunctivae normal.  Cardiovascular:     Rate and Rhythm: Normal rate and regular rhythm.     Heart sounds: No murmur heard. Pulmonary:     Effort: Pulmonary effort is normal.     Breath sounds: No stridor. No wheezing, rhonchi or rales.  Abdominal:     General: Abdomen is flat.     Palpations: There is no mass.     Tenderness: There is no abdominal tenderness. There is no guarding.     Hernia: No hernia is present. There is no hernia in the left inguinal area or right inguinal area.  Genitourinary:    Penis: Uncircumcised. No hypospadias, erythema, discharge or swelling.      Testes: Normal.     Epididymis:     Right: Normal.     Left: Normal.     Comments: He wears a diaper. Musculoskeletal:        General: Normal range of motion.     Cervical back: Neck supple.     Right lower leg: No edema.     Left lower leg: No edema.  Lymphadenopathy:     Cervical: No cervical adenopathy.     Lower Body: No right inguinal adenopathy. No left inguinal adenopathy.  Skin:    General: Skin is warm and dry.     Coloration: Skin is not pale.  Neurological:     General: No focal deficit present.     Mental Status: He is alert. Mental status is at baseline.  Psychiatric:        Mood and Affect: Mood normal.     Lab Results  Component Value Date   WBC 4.8 09/21/2022   HGB 16.2 09/21/2022   HCT 48.4 09/21/2022   PLT 196.0 09/21/2022   GLUCOSE 305 (H) 09/03/2022   CHOL 194 10/09/2021   TRIG 153.0 (H) 10/09/2021   HDL 52.10 10/09/2021   LDLDIRECT 115.0 07/02/2019   LDLCALC 111 (H) 10/09/2021   ALT 25 09/21/2022   AST 17 09/21/2022   NA 137 09/03/2022   K 4.0 09/03/2022   CL 100 09/03/2022   CREATININE 1.07 09/03/2022   BUN 13 09/03/2022    CO2 27 09/03/2022   TSH 1.45 09/21/2022   PSA 2.22 09/21/2022   INR 1.37 09/26/2016   HGBA1C 10.2 (H) 09/03/2022   MICROALBUR 0.7 02/23/2022    CT Angio Neck W and/or Wo Contrast  Result Date: 05/09/2021 CLINICAL DATA:  Neck trauma (Age >= 65y); Stroke/TIA, assess extracranial  arteries. Left-sided neck pain and swelling for 1 week. EXAM: CT ANGIOGRAPHY HEAD AND NECK TECHNIQUE: Multidetector CT imaging of the head and neck was performed using the standard protocol during bolus administration of intravenous contrast. Multiplanar CT image reconstructions and MIPs were obtained to evaluate the vascular anatomy. Carotid stenosis measurements (when applicable) are obtained utilizing NASCET criteria, using the distal internal carotid diameter as the denominator. CONTRAST:  34m OMNIPAQUE IOHEXOL 350 MG/ML SOLN COMPARISON:  Head CT 10/07/2016 FINDINGS: CT HEAD FINDINGS Brain: There is no evidence of an acute infarct, intracranial hemorrhage, mass, midline shift, or extra-axial fluid collection. There is mild cerebral atrophy. A cavum septum pellucidum et vergae is noted, a normal variant. Vascular: Calcified atherosclerosis at the skull base. No hyperdense vessel. Skull: No fracture or suspicious osseous lesion. Sinuses: Mild left maxillary sinus mucosal thickening. Clear mastoid air cells. Orbits: Unremarkable. Review of the MIP images confirms the above findings CTA NECK FINDINGS Aortic arch: Normal variant aortic arch branching pattern with common origin of the brachiocephalic and left common carotid arteries. Mild atherosclerotic plaque without arch vessel origin stenosis. Right carotid system: Patent with minimal calcified plaque at the carotid bifurcation. No evidence of dissection or stenosis. Left carotid system: Patent without evidence of dissection, stenosis, or significant atherosclerosis. Vertebral arteries: The vertebral arteries are patent and codominant without a definite significant stenosis or  dissection although assessment of the right V1 segment is mildly limited by artifact from venous contrast. Skeleton: Moderate cervical disc degeneration. Other neck: No evidence of cervical lymphadenopathy or mass. Upper chest: No apical lung consolidation or mass. Review of the MIP images confirms the above findings CTA HEAD FINDINGS Anterior circulation: The internal carotid arteries are patent from skull base to carotid termini with mild atherosclerotic plaque in the left carotid siphon not resulting in a significant stenosis. ACAs and MCAs are patent without evidence of a proximal branch occlusion or significant proximal stenosis. No aneurysm is identified. Posterior circulation: The intracranial vertebral arteries are widely patent to the basilar. The basilar artery is patent and congenitally small without evidence of a significant focal stenosis. There is a fetal origin of the left PCA. Both PCAs are patent without evidence of a significant proximal stenosis. No aneurysm is identified. Venous sinuses: As permitted by contrast timing, patent. Anatomic variants: Fetal left PCA. Review of the MIP images confirms the above findings IMPRESSION: 1. No evidence of acute intracranial abnormality. 2. Mild atherosclerosis in the head and neck without large vessel occlusion, significant stenosis, or aneurysm. Electronically Signed   By: ALogan BoresM.D.   On: 05/09/2021 19:17   CT Angio Head W or Wo Contrast  Result Date: 05/09/2021 CLINICAL DATA:  Neck trauma (Age >= 65y); Stroke/TIA, assess extracranial arteries. Left-sided neck pain and swelling for 1 week. EXAM: CT ANGIOGRAPHY HEAD AND NECK TECHNIQUE: Multidetector CT imaging of the head and neck was performed using the standard protocol during bolus administration of intravenous contrast. Multiplanar CT image reconstructions and MIPs were obtained to evaluate the vascular anatomy. Carotid stenosis measurements (when applicable) are obtained utilizing NASCET  criteria, using the distal internal carotid diameter as the denominator. CONTRAST:  819mOMNIPAQUE IOHEXOL 350 MG/ML SOLN COMPARISON:  Head CT 10/07/2016 FINDINGS: CT HEAD FINDINGS Brain: There is no evidence of an acute infarct, intracranial hemorrhage, mass, midline shift, or extra-axial fluid collection. There is mild cerebral atrophy. A cavum septum pellucidum et vergae is noted, a normal variant. Vascular: Calcified atherosclerosis at the skull base. No hyperdense vessel. Skull: No  fracture or suspicious osseous lesion. Sinuses: Mild left maxillary sinus mucosal thickening. Clear mastoid air cells. Orbits: Unremarkable. Review of the MIP images confirms the above findings CTA NECK FINDINGS Aortic arch: Normal variant aortic arch branching pattern with common origin of the brachiocephalic and left common carotid arteries. Mild atherosclerotic plaque without arch vessel origin stenosis. Right carotid system: Patent with minimal calcified plaque at the carotid bifurcation. No evidence of dissection or stenosis. Left carotid system: Patent without evidence of dissection, stenosis, or significant atherosclerosis. Vertebral arteries: The vertebral arteries are patent and codominant without a definite significant stenosis or dissection although assessment of the right V1 segment is mildly limited by artifact from venous contrast. Skeleton: Moderate cervical disc degeneration. Other neck: No evidence of cervical lymphadenopathy or mass. Upper chest: No apical lung consolidation or mass. Review of the MIP images confirms the above findings CTA HEAD FINDINGS Anterior circulation: The internal carotid arteries are patent from skull base to carotid termini with mild atherosclerotic plaque in the left carotid siphon not resulting in a significant stenosis. ACAs and MCAs are patent without evidence of a proximal branch occlusion or significant proximal stenosis. No aneurysm is identified. Posterior circulation: The  intracranial vertebral arteries are widely patent to the basilar. The basilar artery is patent and congenitally small without evidence of a significant focal stenosis. There is a fetal origin of the left PCA. Both PCAs are patent without evidence of a significant proximal stenosis. No aneurysm is identified. Venous sinuses: As permitted by contrast timing, patent. Anatomic variants: Fetal left PCA. Review of the MIP images confirms the above findings IMPRESSION: 1. No evidence of acute intracranial abnormality. 2. Mild atherosclerosis in the head and neck without large vessel occlusion, significant stenosis, or aneurysm. Electronically Signed   By: Logan Bores M.D.   On: 05/09/2021 19:17    Assessment & Plan:   Charles Parker was seen today for annual exam.  Diagnoses and all orders for this visit:  Essential hypertension- His blood pressure is adequately well-controlled. -     TSH; Future -     Urinalysis, Routine w reflex microscopic; Future -     Urinalysis, Routine w reflex microscopic -     TSH  Type 2 diabetes mellitus with complication, with long-term current use of insulin ([place])- Managed by ENDO.  Hyperlipidemia with target LDL less than 70- LDL goal achieved. Doing well on the statin  -     TSH; Future -     Hepatic function panel; Future -     Hepatic function panel -     TSH  Iron deficiency- His H&H are normal now. -     IBC + Ferritin; Future -     CBC with Differential/Platelet; Future -     CBC with Differential/Platelet -     IBC + Ferritin  Encounter for general adult medical examination with abnormal findings- Exam completed, labs reviewed, vaccines are up-to-date, no cancer screenings indicated, patient education was given.  Urinary incontinence, unspecified type- There is no evidence of infection. This is caused by hyperglycemia. -     Urinalysis, Routine w reflex microscopic; Future -     CULTURE, URINE COMPREHENSIVE; Future -     CULTURE, URINE COMPREHENSIVE -      Urinalysis, Routine w reflex microscopic  Benign prostatic hyperplasia (BPH) with straining on urination- PSA is normal. -     PSA; Future -     PSA   I am having Charles Parker. Charles "A.D." maintain  his Alcohol Swabs, gabapentin, glucose blood, Gvoke HypoPen 2-Pack, Cholecalciferol, blood glucose meter kit and supplies, FreeStyle Libre 2 Sensor, [employer] 2 Reader, losartan, pioglitazone, solifenacin, Insulin Lispro Prot & Lispro, B-D UF III MINI PEN NEEDLES, ketoconazole, Farxiga, glimepiride, simvastatin, and rivaroxaban.  No orders of the defined types were placed in this encounter.    Follow-up: Return in about 3 months (around 12/21/2022).  Scarlette Calico, MD

## 2022-09-21 NOTE — Patient Instructions (Signed)
Health Maintenance, Male Adopting a healthy lifestyle and getting preventive care are important in promoting health and wellness. Ask your health care provider about: The right schedule for you to have regular tests and exams. Things you can do on your own to prevent diseases and keep yourself healthy. What should I know about diet, weight, and exercise? Eat a healthy diet  Eat a diet that includes plenty of vegetables, fruits, low-fat dairy products, and lean protein. Do not eat a lot of foods that are high in solid fats, added sugars, or sodium. Maintain a healthy weight Body mass index (BMI) is a measurement that can be used to identify possible weight problems. It estimates body fat based on height and weight. Your health care provider can help determine your BMI and help you achieve or maintain a healthy weight. Get regular exercise Get regular exercise. This is one of the most important things you can do for your health. Most adults should: Exercise for at least 150 minutes each week. The exercise should increase your heart rate and make you sweat (moderate-intensity exercise). Do strengthening exercises at least twice a week. This is in addition to the moderate-intensity exercise. Spend less time sitting. Even light physical activity can be beneficial. Watch cholesterol and blood lipids Have your blood tested for lipids and cholesterol at 76 years of age, then have this test every 5 years. You may need to have your cholesterol levels checked more often if: Your lipid or cholesterol levels are high. You are older than 76 years of age. You are at high risk for heart disease. What should I know about cancer screening? Many types of cancers can be detected early and may often be prevented. Depending on your health history and family history, you may need to have cancer screening at various ages. This may include screening for: Colorectal cancer. Prostate cancer. Skin cancer. Lung  cancer. What should I know about heart disease, diabetes, and high blood pressure? Blood pressure and heart disease High blood pressure causes heart disease and increases the risk of stroke. This is more likely to develop in people who have high blood pressure readings or are overweight. Talk with your health care provider about your target blood pressure readings. Have your blood pressure checked: Every 3-5 years if you are 18-39 years of age. Every year if you are 40 years old or older. If you are between the ages of 65 and 75 and are a current or former smoker, ask your health care provider if you should have a one-time screening for abdominal aortic aneurysm (AAA). Diabetes Have regular diabetes screenings. This checks your fasting blood sugar level. Have the screening done: Once every three years after age 45 if you are at a normal weight and have a low risk for diabetes. More often and at a younger age if you are overweight or have a high risk for diabetes. What should I know about preventing infection? Hepatitis B If you have a higher risk for hepatitis B, you should be screened for this virus. Talk with your health care provider to find out if you are at risk for hepatitis B infection. Hepatitis C Blood testing is recommended for: Everyone born from 1945 through 1965. Anyone with known risk factors for hepatitis C. Sexually transmitted infections (STIs) You should be screened each year for STIs, including gonorrhea and chlamydia, if: You are sexually active and are younger than 76 years of age. You are older than 76 years of age and your   health care provider tells you that you are at risk for this type of infection. Your sexual activity has changed since you were last screened, and you are at increased risk for chlamydia or gonorrhea. Ask your health care provider if you are at risk. Ask your health care provider about whether you are at high risk for HIV. Your health care provider  may recommend a prescription medicine to help prevent HIV infection. If you choose to take medicine to prevent HIV, you should first get tested for HIV. You should then be tested every 3 months for as long as you are taking the medicine. Follow these instructions at home: Alcohol use Do not drink alcohol if your health care provider tells you not to drink. If you drink alcohol: Limit how much you have to 0-2 drinks a day. Know how much alcohol is in your drink. In the [place] one drink equals one 12 oz bottle of beer (355 mL), one 5 oz glass of wine (148 mL), or one 1 oz glass of hard liquor (44 mL). Lifestyle Do not use any products that contain nicotine or tobacco. These products include cigarettes, chewing tobacco, and vaping devices, such as e-cigarettes. If you need help quitting, ask your health care provider. Do not use street drugs. Do not share needles. Ask your health care provider for help if you need support or information about quitting drugs. General instructions Schedule regular health, dental, and eye exams. Stay current with your vaccines. Tell your health care provider if: You often feel depressed. You have ever been abused or do not feel safe at home. Summary Adopting a healthy lifestyle and getting preventive care are important in promoting health and wellness. Follow your health care provider's instructions about healthy diet, exercising, and getting tested or screened for diseases. Follow your health care provider's instructions on monitoring your cholesterol and blood pressure. This information is not intended to replace advice given to you by your health care provider. Make sure you discuss any questions you have with your health care provider. Document Revised: 02/09/2021 Document Reviewed: 02/09/2021 Elsevier Patient Education  2023 Elsevier Inc.  

## 2022-09-23 LAB — CULTURE, URINE COMPREHENSIVE

## 2022-10-12 ENCOUNTER — Telehealth: Payer: Self-pay | Admitting: Nutrition

## 2022-10-12 ENCOUNTER — Encounter: Payer: 59 | Attending: Endocrinology | Admitting: Nutrition

## 2022-10-12 DIAGNOSIS — E118 Type 2 diabetes mellitus with unspecified complications: Secondary | ICD-10-CM | POA: Insufficient documentation

## 2022-10-12 DIAGNOSIS — Z794 Long term (current) use of insulin: Secondary | ICD-10-CM | POA: Diagnosis not present

## 2022-10-12 MED ORDER — DEXCOM G7 SENSOR MISC: 3 refills | Status: DC

## 2022-10-12 NOTE — Progress Notes (Signed)
Patient is here to day to review his insulin doses, diet and SBGM. SBGM:  Says he went to the drug store and got the wrong test strips for his meter.  Has not tested his blood sugar since seeing DR. Dwyane Dee.              Says does not have a Libre at home.  His phone is a flip phone unable to download any apps.  He was trained on the use of the [place] with readings going to his reader that was set with date/time.  Blood sugar 1 hr. Pc 2 sausage biscuits was 216 with 2 arrows pointing straight.  He reports that he is going to like this and and that he will want to continue this.  He was able to put the code in and start the sensor. The sensor was inserted into his left outer upper arm without difficulty:  lot: [id number] exp:8/24.   INSULIN DOSE:  Humalog 70/30. 40u 10-15 minutes before breakfast and supper:  8AM and 6PM. Exercise:  30 minute walk pcB and pcL when weather permits-pace is "slow but steady" Diet:  Patient does not cook.  Typical day: 8AM: up, takes insulin eats bowl of cheerios with 2% milk and 8 ounces of orange juice.  Once  a week, will have 2 eggs with 2pieces of toast with 8 ounces of juice.   10PM: package of 6 peanut butter crackers 1PM: balogna sandwich with 'handful of chips' and diet drink  occasional banana or orange  no teeth, so can not eat apples or chew hard foods, also occasional fast food of [employer] burger and small fries with diet drink 6PM: Hungry man prepared dinners with diet drink-usually baked or fried chicken or fish Denies eating after supper. Suggestions given: Stop cereal and milk.  He did not like this.  I told him that I would call him tomorrow morning to see how high his blood sugar was going after cereal and juice.  He agreed to this and we can re discuss this at this time Suggested he reduce his juice to only 4 ounces at a time.  He agreed to do this.  Suggestions given for other prepared meals that are lower in sodium and gave him a list of these.    Will call him tomorrow around 11AM to see how high his blood sugar is going.

## 2022-10-12 NOTE — Telephone Encounter (Signed)
Patient was trained on the [place].

## 2022-10-12 NOTE — Patient Instructions (Signed)
LImit orange juice to 4 ounces at one time LImit banana to only 1/2 at one time Continue walking 1-2 times/day Change dexcom sensor every 10 days Read over manual and call dexcom help line if sensor falls off, or if questions.

## 2022-10-12 NOTE — Telephone Encounter (Signed)
Rx sent 

## 2022-10-15 ENCOUNTER — Other Ambulatory Visit (HOSPITAL_COMMUNITY): Payer: Self-pay

## 2022-10-28 ENCOUNTER — Other Ambulatory Visit (HOSPITAL_COMMUNITY): Payer: Self-pay

## 2022-11-01 ENCOUNTER — Ambulatory Visit (INDEPENDENT_AMBULATORY_CARE_PROVIDER_SITE_OTHER): Payer: 59

## 2022-11-01 DIAGNOSIS — E1151 Type 2 diabetes mellitus with diabetic peripheral angiopathy without gangrene: Secondary | ICD-10-CM

## 2022-11-01 DIAGNOSIS — M2011 Hallux valgus (acquired), right foot: Secondary | ICD-10-CM

## 2022-11-01 DIAGNOSIS — M2012 Hallux valgus (acquired), left foot: Secondary | ICD-10-CM

## 2022-11-01 NOTE — Progress Notes (Signed)
Patient presents to the office today for diabetic shoe and insole measuring.  Patient was measured with brannock device to determine size and width for 1 pair of extra depth shoes and foam casted for 3 pair of insoles.   ABN signed.   Documentation of medical necessity will be sent to patient's treating diabetic doctor to verify and sign.   Patient's diabetic provider: Elayne Snare, MD   Shoes and insoles will be ordered at that time and patient will be notified for an appointment for fitting when they arrive.   Brannock measurement: 11.5 w  Patient shoe selection-   1st   Shoe choice:   [id number] APEX  Shoe size ordered: 12 W

## 2022-11-02 ENCOUNTER — Telehealth: Payer: Self-pay | Admitting: Endocrinology

## 2022-11-02 NOTE — Telephone Encounter (Signed)
Patient stopped into the office to advise that the paperwork will be coming from [facility] for diabetic shoes.  Please contact patient to advise when completed and returned to [facility] - [phone]

## 2022-11-16 ENCOUNTER — Ambulatory Visit (INDEPENDENT_AMBULATORY_CARE_PROVIDER_SITE_OTHER): Payer: 59 | Admitting: Podiatry

## 2022-11-16 ENCOUNTER — Encounter: Payer: Self-pay | Admitting: Podiatry

## 2022-11-16 DIAGNOSIS — E1151 Type 2 diabetes mellitus with diabetic peripheral angiopathy without gangrene: Secondary | ICD-10-CM | POA: Diagnosis not present

## 2022-11-16 DIAGNOSIS — B351 Tinea unguium: Secondary | ICD-10-CM | POA: Diagnosis not present

## 2022-11-16 NOTE — Progress Notes (Signed)
This patient returns to my office for at risk foot care.  This patient requires this care by a professional since this patient will be at risk due to having diabetes type 2 and coagulation defect.  Patient is taking xarelto.  This patient is unable to cut nails himself since the patient cannot reach his nails.These nails are painful walking and wearing shoes.  This patient presents for at risk foot care today.  General Appearance  Alert, conversant and in no acute stress.  Vascular  Dorsalis pedis and posterior tibial  pulses are weakly palpable  bilaterally.  Capillary return is within normal limits  bilaterally. Temperature is within normal limits  bilaterally.  Neurologic  Senn-Weinstein monofilament wire test within normal limits  bilaterally. Muscle power within normal limits bilaterally.  Nails Thick disfigured discolored nails with subungual debris  from hallux to fifth toes bilaterally.  Orthopedic  No limitations of motion  feet .  No crepitus or effusions noted.  HAV with overlapping second digit  B/L.  Skin  normotropic skin with no porokeratosis noted bilaterally.  No signs of infections or ulcers noted.     Onychomycosis  Pain in right toes  Pain in left toes  Consent was obtained for treatment procedures.   Mechanical debridement of nails 1-5  bilaterally performed with a nail nipper.  Filed with dremel without incident.     Return office visit     3 months                Told patient to return for periodic foot care and evaluation due to potential at risk complications.   Gardiner Barefoot DPM

## 2022-12-02 ENCOUNTER — Encounter: Payer: Self-pay | Admitting: Gastroenterology

## 2022-12-02 ENCOUNTER — Other Ambulatory Visit (INDEPENDENT_AMBULATORY_CARE_PROVIDER_SITE_OTHER): Payer: 59

## 2022-12-02 DIAGNOSIS — Z794 Long term (current) use of insulin: Secondary | ICD-10-CM | POA: Diagnosis not present

## 2022-12-02 DIAGNOSIS — E7800 Pure hypercholesterolemia, unspecified: Secondary | ICD-10-CM | POA: Diagnosis not present

## 2022-12-02 DIAGNOSIS — E1165 Type 2 diabetes mellitus with hyperglycemia: Secondary | ICD-10-CM

## 2022-12-02 LAB — BASIC METABOLIC PANEL
BUN: 10 mg/dL (ref 6–23)
CO2: 26 mEq/L (ref 19–32)
Calcium: 9.7 mg/dL (ref 8.4–10.5)
Chloride: 104 mEq/L (ref 96–112)
Creatinine, Ser: 0.95 mg/dL (ref 0.40–1.50)
GFR: 77.61 mL/min (ref >60.00)
Glucose, Bld: 222 mg/dL — ABNORMAL HIGH (ref 70–99)
Potassium: 3.7 mEq/L (ref 3.5–5.1)
Sodium: 140 mEq/L (ref 135–145)

## 2022-12-02 LAB — LDL CHOLESTEROL, DIRECT: Direct LDL: 169 mg/dL

## 2022-12-02 LAB — HEMOGLOBIN A1C: Hgb A1c MFr Bld: 10.3 % — ABNORMAL HIGH (ref 4.6–6.5)

## 2022-12-03 ENCOUNTER — Telehealth: Payer: Self-pay

## 2022-12-03 LAB — FRUCTOSAMINE: Fructosamine: 431 umol/L — ABNORMAL HIGH (ref 0–285)

## 2022-12-03 NOTE — Telephone Encounter (Signed)
PA request received via CMM for Dexcom G7 Sensor  PA has been submitted to [employer] and is pending determination.  Key: [id number]

## 2022-12-07 ENCOUNTER — Encounter: Payer: Self-pay | Admitting: Endocrinology

## 2022-12-07 ENCOUNTER — Other Ambulatory Visit: Payer: Self-pay

## 2022-12-07 ENCOUNTER — Ambulatory Visit (INDEPENDENT_AMBULATORY_CARE_PROVIDER_SITE_OTHER): Payer: 59 | Admitting: Endocrinology

## 2022-12-07 VITALS — BP 122/88 | HR 77 | Ht 71.5 in | Wt 187.2 lb

## 2022-12-07 DIAGNOSIS — E1165 Type 2 diabetes mellitus with hyperglycemia: Secondary | ICD-10-CM

## 2022-12-07 DIAGNOSIS — Z794 Long term (current) use of insulin: Secondary | ICD-10-CM | POA: Diagnosis not present

## 2022-12-07 DIAGNOSIS — E118 Type 2 diabetes mellitus with unspecified complications: Secondary | ICD-10-CM

## 2022-12-07 DIAGNOSIS — E7800 Pure hypercholesterolemia, unspecified: Secondary | ICD-10-CM

## 2022-12-07 MED ORDER — DAPAGLIFLOZIN PROPANEDIOL 10 MG PO TABS: 10.0000 mg | ORAL_TABLET | Freq: Every day | ORAL | 3 refills | Status: DC

## 2022-12-07 MED ORDER — GLUCOSE BLOOD VI STRP: ORAL_STRIP | 3 refills | Status: AC

## 2022-12-07 NOTE — Patient Instructions (Addendum)
Insulin 46 units 2x daily  Check blood sugars on waking up    Also check blood sugars about 2 hours after meals and do this after different meals by rotation  Recommended blood sugar levels on waking up are 90-130 and about 2 hours after meal is 130-160  Please bring your blood sugar monitor to each visit, thank you   Take Simvastatin daily

## 2022-12-07 NOTE — Progress Notes (Signed)
Patient ID: Charles Parker, male   DOB: [birth year], 77 y.o.   MRN: [id number]          Reason for Appointment: Follow-up for Type 2 Diabetes  Referring physician: Scarlette Calico   History of Present Illness:          Date of diagnosis of type 2 diabetes mellitus: 2014        Background history:   He has been on various oral hypoglycemic drugs since the onset including metformin, Januvia and Amaryl Was also on Invokana in 2015 for about a year but not clear why this was stopped His level of control has been quite variable with highest A1c 13.6 in 06/2015  He has been on insulin since about 10/2014 as documented in his record  Recent history:   INSULIN regimen is:  NovoLog mix 40 units twice daily  Non-insulin hypoglycemic drugs the patient is taking [id number] 10 mg daily, Amaryl  His A1c is about the same at 10.3 as of 12/02/2022  Fructosamine last 291, previously at 417   Current management, blood sugar patterns and problems identified: He has not checked his blood sugar since December He was started on the [facility] sensor as a trial in December but he says the sensor fell off and has only a few hours of readings, he did not call to follow-up on this He was seen by the diabetes educator and advised to modify diet but he is eating Cheerios for breakfast in the morning mostly and sometimes oatmeal without any protein He says he is taking his insulin consistently twice a day but not clear why his fasting reading was 222 He is taking 40 units reportedly on his insulin but previously was reporting 46 units He is trying to walk up to 30 minutes daily although slowly Although he thinks he is taking his [place] regularly do not see regular refills on this on his prescription history and none since October  Side effects from medications have been: Hives from metformin  Compliance with the medical regimen: Inconsistent  Glucose monitoring:  done< 1 times a   day usually         Glucometer: Accu-Chek guide       Blood Glucose readings not available Previously  FASTING range 104-179  Afternoon 178, bedtime 119   Self-care: The diet that the patient has been following is: tries to limit [employer] .      Typical meal intake: Breakfast is eggs/cereal or oatmeal usually, lunch:  Lean cuisine or sandwiches.  Dinner chicken and vegetables.   Snacks will be chips, peanut butter crackers Supper at 6 pm                Dietician visit, most recent:3/19              Weight history:  Wt Readings from Last 3 Encounters:  12/07/22 187 lb 3.2 oz (84.9 kg)  09/21/22 188 lb (85.3 kg)  09/07/22 186 lb (84.4 kg)    Glycemic control:   Lab Results  Component Value Date   HGBA1C 10.3 (H) 12/02/2022   HGBA1C 10.2 (H) 09/03/2022   HGBA1C 8.4 (H) 05/11/2022   Lab Results  Component Value Date   MICROALBUR 0.7 02/23/2022   LDLCALC 111 (H) 10/09/2021   CREATININE 0.95 12/02/2022   Lab Results  Component Value Date   MICRALBCREAT 0.6 02/23/2022    Lab Results  Component Value Date   FRUCTOSAMINE 431 (H) 12/02/2022   FRUCTOSAMINE  369 (H) 02/23/2022   FRUCTOSAMINE 417 (H) 10/09/2021      Allergies as of 12/07/2022       Reactions   Metformin And Related Diarrhea   Codeine Rash   All over the body        Medication List        Accurate as of December 07, 2022  4:23 PM. If you have any questions, ask your nurse or doctor.          STOP taking these medications    pioglitazone 45 MG tablet Commonly known as: Actos Stopped by: Elayne Snare, MD       TAKE these medications    Alcohol Swabs Pads Use to clean area before insulin injections. DX E11.8   B-D UF III MINI PEN NEEDLES 31G X 5 MM Misc Generic drug: Insulin Pen Needle Inject 1 Act into the skin 2 (two) times daily. ADMINISTER 30 UNITS EVERY EVENING   blood glucose meter kit and supplies Kit 1 each by Does not apply route daily as needed. Accu-Chek Aviva preferred by patient. Use up  to four times daily as directed. (FOR ICD-9 250.00, 250.01).   Cholecalciferol 50 MCG (2000 UT) Tabs Take 1 tablet (2,000 Units total) by mouth daily.   dapagliflozin propanediol 10 MG Tabs tablet Commonly known as: Farxiga Take 1 tablet (10 mg total) by mouth daily. What changed: how much to take Changed by: Elayne Snare, MD   FreeStyle Libre 2 Reader [facility] 1 Act by Does not apply route daily.   FreeStyle Libre 2 Sensor Misc 1 Act by Does not apply route daily.   Dexcom G7 Sensor Misc Change every 10 days   gabapentin 300 MG capsule Commonly known as: NEURONTIN TAKE 1 CAPSULE(300 MG) BY MOUTH TWICE DAILY What changed:  how much to take how to take this when to take this   glimepiride 4 MG tablet Commonly known as: AMARYL TAKE 1 TABLET BY MOUTH DAILY  BEFORE BREAKFAST   glucose blood test strip Use Accu Chek Aviva Test Strips as instructed to check blood sugar three times daily.   Gvoke HypoPen 2-Pack 1 MG/0.2ML Soaj Generic drug: Glucagon Inject 1 Act into the skin daily as needed.   Insulin Lispro Prot & Lispro (75-25) 100 UNIT/ML Kwikpen Commonly known as: HumaLOG Mix 75/25 KwikPen Inject 36 Units into the skin in the morning and at bedtime. What changed: additional instructions   ketoconazole 2 % cream Commonly known as: NIZORAL Apply 1 application. topically 2 (two) times daily.   losartan 25 MG tablet Commonly known as: COZAAR Take 1 tablet (25 mg total) by mouth daily.   rivaroxaban 10 MG Tabs tablet Commonly known as: Xarelto Take 1 tablet (10 mg total) by mouth daily.   simvastatin 40 MG tablet Commonly known as: ZOCOR Take 1 tablet (40 mg total) by mouth at bedtime.   solifenacin 10 MG tablet Commonly known as: VESICARE Take 1 tablet (10 mg total) by mouth daily.        Allergies:  Allergies  Allergen Reactions   Metformin And Related Diarrhea   Codeine Rash    All over the body    Past Medical History:  Diagnosis Date   Clotting  disorder ([place])    DVT   Diabetes mellitus    type II   Diverticulosis    DVT (deep venous thrombosis) (HCC)    Glaucoma    no eye drops    Hepatic cyst    Hypercholesterolemia  Hypertension    Renal cyst    Sleep apnea    Tubulovillous adenoma     Past Surgical History:  Procedure Laterality Date   BLADDER NECK RECONSTRUCTION  02/03/2012   Procedure: BLADDER NECK REPAIR;  Surgeon: Adin Hector, MD;  Location: [facility] ORS;  Service: General;;   COLONOSCOPY  02/2013   polyp removal(mult.)   KNEE SURGERY  2004   left   POLYPECTOMY     PROCTOSCOPY  02/03/2012   Procedure: PROCTOSCOPY;  Surgeon: Adin Hector, MD;  Location: [facility] ORS;  Service: General;;   STOMACH SURGERY  02/2012   TEE WITHOUT CARDIOVERSION N/A 10/05/2016   Procedure: TRANSESOPHAGEAL ECHOCARDIOGRAM (TEE);  Surgeon: Sanda Klein, MD;  Location: [facility] ENDOSCOPY;  Service: Cardiovascular;  Laterality: N/A;    Family History  Problem Relation Age of Onset   Hypertension Mother    Diabetes Mother    Cancer Mother        ? location   Colon cancer Father 20   Colon polyps Neg Hx    Liver cancer Neg Hx     Social History:  reports that he has never smoked. He has never used smokeless tobacco. He reports that he does not drink alcohol and does not use drugs.   Review of Systems  Lipid history: Has been on simvastatin 40 mg with variable control, followed by PCP However LDL is now 169 compared to 111 and likely has not taken his simvastatin   Lab Results  Component Value Date   CHOL 194 10/09/2021   CHOL 153 08/19/2020   CHOL 202 (H) 05/12/2020   Lab Results  Component Value Date   HDL 52.10 10/09/2021   HDL 43.00 08/19/2020   HDL 50.80 05/12/2020   Lab Results  Component Value Date   LDLCALC 111 (H) 10/09/2021   LDLCALC 84 08/19/2020   LDLCALC 124 (H) 05/12/2020   Lab Results  Component Value Date   TRIG 153.0 (H) 10/09/2021   TRIG 130.0 08/19/2020   TRIG 134.0 05/12/2020   Lab Results  Component  Value Date   CHOLHDL 4 10/09/2021   CHOLHDL 4 08/19/2020   CHOLHDL 4 05/12/2020   Lab Results  Component Value Date   LDLDIRECT 169.0 12/02/2022   LDLDIRECT 115.0 07/02/2019   LDLDIRECT 118.0 11/11/2015           He has history of neuropathy, not symptomatic and not taking gabapentin prescribed by PCP  Most recent foot exam: 2/22  RENAL function is consistently normal with continuing Farxiga 10 mg  Lab Results  Component Value Date   CREATININE 0.95 12/02/2022   CREATININE 1.07 09/03/2022   CREATININE 1.12 05/11/2022    Hypertension: On losartan 25 mg prescribed by PCP  BP Readings from Last 3 Encounters:  12/07/22 122/88  09/21/22 128/84  09/07/22 130/74    Has had all 3 Covid shots  May have diabetic shoes for his foot deformities    Physical Examination:  BP 122/88 (BP Location: Left Arm, Patient Position: Sitting, Cuff Size: Normal)   Pulse 77   Ht 5' 11.5" (1.816 m)   Wt 187 lb 3.2 oz (84.9 kg)   SpO2 94%   BMI 25.75 kg/m       ASSESSMENT:  Diabetes type 2 on insulin  See history of present illness for detailed discussion of current diabetes management, blood sugar patterns and problems identified   He is on a regimen of premixed insulin twice a day and Farxiga  His A1c  is 8.3, about the same  His blood sugar is difficult to assess because of lack of glucose monitoring Likely that he can manage to use the Dexcom sensor and even the trial sensor he had in December fell off He has poor understanding on how to manage his diabetes and likely not taking his insulin regularly Also appears to be behind on his refills for Farxiga Fasting glucose was over 200 in the lab  HYPERLIPIDEMIA: His lipids are poorly controlled and likely has not taken his simvastatin regularly with LDL 169  PLAN:    He again be referred to the diabetes educator to assess his day-to-day management and difficulties with compliance Given a flowsheet to keep a record of his  blood sugars and his insulin doses that he can check off it on a daily basis His brother was also present today and may need to check on him periodically Since he is only taking 40 units of insulin he will go up to 46 units of insulin twice a day Reinforced the time of taking his meals Need to add protein at breakfast and given examples of doing this in addition to his cereal or oatmeal New prescription for Farxiga sent  He will check to see if he is taking his simvastatin and make sure he takes this regularly  Patient Instructions  Insulin 46 units 2x daily  Check blood sugars on waking up    Also check blood sugars about 2 hours after meals and do this after different meals by rotation  Recommended blood sugar levels on waking up are 90-130 and about 2 hours after meal is 130-160  Please bring your blood sugar monitor to each visit, thank you   Take Simvastatin daily       Elayne Snare 12/07/2022, 4:23 PM   Note: This office note was prepared with Dragon voice recognition system technology. Any transcriptional errors that result from this process are unintentional.

## 2022-12-10 NOTE — Telephone Encounter (Signed)
Patient Advocate Encounter  Prior Authorization for [employer] has been approved  through [employer].  [id number]     Effective: 12-08-2022 to 10-04-2023

## 2023-01-10 DIAGNOSIS — R3915 Urgency of urination: Secondary | ICD-10-CM | POA: Diagnosis not present

## 2023-02-08 ENCOUNTER — Other Ambulatory Visit: Payer: Self-pay | Admitting: Internal Medicine

## 2023-02-08 DIAGNOSIS — E785 Hyperlipidemia, unspecified: Secondary | ICD-10-CM

## 2023-02-08 DIAGNOSIS — D6859 Other primary thrombophilia: Secondary | ICD-10-CM

## 2023-02-08 DIAGNOSIS — E1165 Type 2 diabetes mellitus with hyperglycemia: Secondary | ICD-10-CM

## 2023-02-08 DIAGNOSIS — Z794 Long term (current) use of insulin: Secondary | ICD-10-CM

## 2023-02-15 ENCOUNTER — Other Ambulatory Visit (INDEPENDENT_AMBULATORY_CARE_PROVIDER_SITE_OTHER): Payer: 59

## 2023-02-15 ENCOUNTER — Encounter: Payer: Self-pay | Admitting: Podiatry

## 2023-02-15 ENCOUNTER — Telehealth: Payer: Self-pay | Admitting: Internal Medicine

## 2023-02-15 ENCOUNTER — Ambulatory Visit (INDEPENDENT_AMBULATORY_CARE_PROVIDER_SITE_OTHER): Payer: 59 | Admitting: Podiatry

## 2023-02-15 DIAGNOSIS — Z794 Long term (current) use of insulin: Secondary | ICD-10-CM | POA: Diagnosis not present

## 2023-02-15 DIAGNOSIS — E7800 Pure hypercholesterolemia, unspecified: Secondary | ICD-10-CM | POA: Diagnosis not present

## 2023-02-15 DIAGNOSIS — E1165 Type 2 diabetes mellitus with hyperglycemia: Secondary | ICD-10-CM | POA: Diagnosis not present

## 2023-02-15 DIAGNOSIS — B351 Tinea unguium: Secondary | ICD-10-CM | POA: Diagnosis not present

## 2023-02-15 DIAGNOSIS — E1151 Type 2 diabetes mellitus with diabetic peripheral angiopathy without gangrene: Secondary | ICD-10-CM

## 2023-02-15 LAB — LIPID PANEL
Cholesterol: 199 mg/dL (ref 0–200)
HDL: 46.2 mg/dL (ref >39.00)
LDL Cholesterol: 122 mg/dL — ABNORMAL HIGH (ref 0–99)
NonHDL: 152.78
Total CHOL/HDL Ratio: 4
Triglycerides: 154 mg/dL — ABNORMAL HIGH (ref 0.0–149.0)
VLDL: 30.8 mg/dL (ref 0.0–40.0)

## 2023-02-15 LAB — MICROALBUMIN / CREATININE URINE RATIO
Creatinine,U: 47.6 mg/dL
Microalb Creat Ratio: 1.5 mg/g (ref 0.0–30.0)
Microalb, Ur: <0.7 mg/dL (ref 0.0–1.9)

## 2023-02-15 LAB — COMPREHENSIVE METABOLIC PANEL
ALT: 23 U/L (ref 0–53)
AST: 18 U/L (ref 0–37)
Albumin: 4.4 g/dL (ref 3.5–5.2)
Alkaline Phosphatase: 66 U/L (ref 39–117)
BUN: 18 mg/dL (ref 6–23)
CO2: 26 mEq/L (ref 19–32)
Calcium: 9.5 mg/dL (ref 8.4–10.5)
Chloride: 100 mEq/L (ref 96–112)
Creatinine, Ser: 1.15 mg/dL (ref 0.40–1.50)
GFR: 61.62 mL/min (ref >60.00)
Glucose, Bld: 330 mg/dL — ABNORMAL HIGH (ref 70–99)
Potassium: 3.9 mEq/L (ref 3.5–5.1)
Sodium: 137 mEq/L (ref 135–145)
Total Bilirubin: 0.6 mg/dL (ref 0.2–1.2)
Total Protein: 7.5 g/dL (ref 6.0–8.3)

## 2023-02-15 LAB — HEMOGLOBIN A1C: Hgb A1c MFr Bld: 10.8 % — ABNORMAL HIGH (ref 4.6–6.5)

## 2023-02-15 NOTE — Progress Notes (Signed)
This patient returns to my office for at risk foot care.  This patient requires this care by a professional since this patient will be at risk due to having diabetes type 2 and coagulation defect.  Patient is taking xarelto.  This patient is unable to cut nails himself since the patient cannot reach his nails.These nails are painful walking and wearing shoes.  This patient presents for at risk foot care today.  General Appearance  Alert, conversant and in no acute stress.  Vascular  Dorsalis pedis and posterior tibial  pulses are weakly palpable  bilaterally.  Capillary return is within normal limits  bilaterally. Temperature is within normal limits  bilaterally.  Neurologic  Senn-Weinstein monofilament wire test within normal limits  bilaterally. Muscle power within normal limits bilaterally.  Nails Thick disfigured discolored nails with subungual debris  from hallux to fifth toes bilaterally.  Orthopedic  No limitations of motion  feet .  No crepitus or effusions noted.  HAV with overlapping second digit  B/L.  Skin  normotropic skin with no porokeratosis noted bilaterally.  No signs of infections or ulcers noted.     Onychomycosis  Pain in right toes  Pain in left toes  Consent was obtained for treatment procedures.   Mechanical debridement of nails 1-5  bilaterally performed with a nail nipper.  Filed with dremel without incident.  Gave paperwork for him to give to his doctor.   Return office visit     3 months                Told patient to return for periodic foot care and evaluation due to potential at risk complications.   Helane Gunther DPM

## 2023-02-15 NOTE — Telephone Encounter (Signed)
Patient dropped off document  Therapeutic shoe certification , to be filled out by provider. Patient requested to send it via Call Patient to pick up within 7-days. Document is located in providers tray at front office.Please advise at [facility] [phone]

## 2023-02-16 NOTE — Telephone Encounter (Signed)
[facility] paperwork given to PCP to review and sign.

## 2023-02-22 ENCOUNTER — Ambulatory Visit (INDEPENDENT_AMBULATORY_CARE_PROVIDER_SITE_OTHER): Payer: 59 | Admitting: Endocrinology

## 2023-02-22 ENCOUNTER — Encounter: Payer: Self-pay | Admitting: Endocrinology

## 2023-02-22 VITALS — BP 124/76 | HR 85 | Ht 71.75 in | Wt 178.6 lb

## 2023-02-22 DIAGNOSIS — Z794 Long term (current) use of insulin: Secondary | ICD-10-CM | POA: Diagnosis not present

## 2023-02-22 DIAGNOSIS — E1165 Type 2 diabetes mellitus with hyperglycemia: Secondary | ICD-10-CM | POA: Diagnosis not present

## 2023-02-22 DIAGNOSIS — E7800 Pure hypercholesterolemia, unspecified: Secondary | ICD-10-CM | POA: Diagnosis not present

## 2023-02-22 MED ORDER — EZETIMIBE 10 MG PO TABS: 10.0000 mg | ORAL_TABLET | Freq: Every day | ORAL | 3 refills | Status: AC

## 2023-02-22 NOTE — Patient Instructions (Addendum)
Check blood sugars on waking up 3 days a week  Also check blood sugars about 2 hours after meals and do this after different meals by rotation  Recommended blood sugar levels on waking up are 90-130 and about 2 hours after meal is 130-180  Please bring your blood sugar monitor to each visit, thank you  Put a reminder on your refrigerator to make sure you take your insulin twice a day before breakfast and dinner daily  Take 40 units before breakfast and also dinner Call if blood sugars are consistently over 200 are getting below 90  Also start taking ezetimibe with simvastatin for cholesterol

## 2023-02-22 NOTE — Progress Notes (Signed)
Patient ID: Charles Parker, male   DOB: 08/28/1946, 77 y.o.   MRN: [id number]          Reason for Appointment: Follow-up for Type 2 Diabetes  Referring physician: Sanda Linger   History of Present Illness:          Date of diagnosis of type 2 diabetes mellitus: 2014        Background history:   He has been on various oral hypoglycemic drugs since the onset including metformin, Januvia and Amaryl Was also on Invokana in 2015 for about a year but not clear why this was stopped His level of control has been quite variable with highest A1c 13.6 in 06/2015  He has been on insulin since about 10/2014 as documented in his record  Recent history:   INSULIN regimen is:  NovoLog mix 40 units -30 U  Non-insulin hypoglycemic drugs the patient is taking WUJ:WJXBJYN 10 mg daily, Amaryl  His A1c is about the same at 10.8  Fructosamine last 291, previously at 417   Current management, blood sugar patterns and problems identified: He has not checked his blood sugar regularly and remembers only 1 reading from a couple of days ago His blood sugars are likely higher from not taking his insulin regularly and he admits to doing that He will start to take 46 units twice daily on the last visit but is only taking 40 in the morning and 30 in the evening Again he says that he is more involved with his family issues and does not check his sugar or take insulin consistently twice a day His weight appears to be lower He does not think he is excessively thirsty during the day but will have more frequent urination at night Has taken his Marcelline Deist regularly He is trying to do some walking around his neighborhood He was tried on the Dexcom sensor previously but could not keep it on and unlikely that he will learn how to change this on his own  Side effects from medications have been: Hives from metformin  Compliance with the medical regimen: Inconsistent  Glucose monitoring:  done< 1 times a   day  usually        Glucometer: Accu-Chek guide       Blood Glucose readings   160 once  Previously  FASTING range 104-179  Afternoon 178, bedtime 119   Self-care: The diet that the patient has been following is: tries to limit [employer] .      Typical meal intake: Breakfast is eggs/cereal or oatmeal usually, lunch:  Lean cuisine or sandwiches.  Dinner chicken and vegetables.   Snacks will be chips, peanut butter crackers Supper at 6 pm                Dietician visit, most recent:3/19              Weight history:  Wt Readings from Last 3 Encounters:  02/22/23 178 lb 9.6 oz (81 kg)  12/07/22 187 lb 3.2 oz (84.9 kg)  09/21/22 188 lb (85.3 kg)    Glycemic control:   Lab Results  Component Value Date   HGBA1C 10.8 (H) 02/15/2023   HGBA1C 10.3 (H) 12/02/2022   HGBA1C 10.2 (H) 09/03/2022   Lab Results  Component Value Date   MICROALBUR <0.7 02/15/2023   LDLCALC 122 (H) 02/15/2023   CREATININE 1.15 02/15/2023   Lab Results  Component Value Date   MICRALBCREAT 1.5 02/15/2023    Lab Results  Component Value Date   FRUCTOSAMINE 431 (H) 12/02/2022   FRUCTOSAMINE 369 (H) 02/23/2022   FRUCTOSAMINE 417 (H) 10/09/2021      Allergies as of 02/22/2023       Reactions   Metformin And Related Diarrhea   Codeine Rash   All over the body        Medication List        Accurate as of Feb 22, 2023  8:41 AM. If you have any questions, ask your nurse or doctor.          Alcohol Swabs Pads Use to clean area before insulin injections. DX E11.8   B-D UF III MINI PEN NEEDLES 31G X 5 MM Misc Generic drug: Insulin Pen Needle Inject 1 Act into the skin 2 (two) times daily. ADMINISTER 30 UNITS EVERY EVENING   blood glucose meter kit and supplies Kit 1 each by Does not apply route daily as needed. Accu-Chek Aviva preferred by patient. Use up to four times daily as directed. (FOR ICD-9 250.00, 250.01).   Cholecalciferol 50 MCG (2000 UT) Tabs Take 1 tablet (2,000 Units  total) by mouth daily.   dapagliflozin propanediol 10 MG Tabs tablet Commonly known as: Farxiga Take 1 tablet (10 mg total) by mouth daily.   ezetimibe 10 MG tablet Commonly known as: Zetia Take 1 tablet (10 mg total) by mouth daily. Started by: Reather Littler, MD   FreeStyle Libre 2 Reader [facility] 1 Act by Does not apply route daily.   FreeStyle Libre 2 Sensor Misc 1 Act by Does not apply route daily.   Dexcom G7 Sensor Misc Change every 10 days   gabapentin 300 MG capsule Commonly known as: NEURONTIN TAKE 1 CAPSULE(300 MG) BY MOUTH TWICE DAILY What changed:  how much to take how to take this when to take this   glimepiride 4 MG tablet Commonly known as: AMARYL TAKE 1 TABLET BY MOUTH DAILY  BEFORE BREAKFAST   glucose blood test strip Use Accu Chek Aviva Test Strips as instructed to check blood sugar three times daily.   Gvoke HypoPen 2-Pack 1 MG/0.2ML Soaj Generic drug: Glucagon Inject 1 Act into the skin daily as needed.   Insulin Lispro Prot & Lispro (75-25) 100 UNIT/ML Kwikpen Commonly known as: HumaLOG Mix 75/25 KwikPen Inject 36 Units into the skin in the morning and at bedtime. What changed: additional instructions   ketoconazole 2 % cream Commonly known as: NIZORAL Apply 1 application. topically 2 (two) times daily.   losartan 25 MG tablet Commonly known as: COZAAR Take 1 tablet (25 mg total) by mouth daily.   simvastatin 40 MG tablet Commonly known as: ZOCOR TAKE 1 TABLET BY MOUTH AT  BEDTIME   solifenacin 10 MG tablet Commonly known as: VESICARE Take 1 tablet (10 mg total) by mouth daily.   tamsulosin 0.4 MG Caps capsule Commonly known as: FLOMAX Take 0.4 mg by mouth daily.   Xarelto 10 MG Tabs tablet Generic drug: rivaroxaban TAKE 1 TABLET BY MOUTH DAILY        Allergies:  Allergies  Allergen Reactions   Metformin And Related Diarrhea   Codeine Rash    All over the body    Past Medical History:  Diagnosis Date   Clotting disorder  (HCC)    DVT   Diabetes mellitus    type II   Diverticulosis    DVT (deep venous thrombosis) (HCC)    Glaucoma    no eye drops    Hepatic cyst  Hypercholesterolemia    Hypertension    Renal cyst    Sleep apnea    Tubulovillous adenoma     Past Surgical History:  Procedure Laterality Date   BLADDER NECK RECONSTRUCTION  02/03/2012   Procedure: BLADDER NECK REPAIR;  Surgeon: Ardeth Sportsman, MD;  Location: [facility]S;  Service: General;;   COLONOSCOPY  02/2013   polyp removal(mult.)   KNEE SURGERY  2004   left   POLYPECTOMY     PROCTOSCOPY  02/03/2012   Procedure: PROCTOSCOPY;  Surgeon: Ardeth Sportsman, MD;  Location: [facility];  Service: General;;   STOMACH SURGERY  02/2012   TEE WITHOUT CARDIOVERSION N/A 10/05/2016   Procedure: TRANSESOPHAGEAL ECHOCARDIOGRAM (TEE);  Surgeon: Thurmon Fair, MD;  Location: [facility] ENDOSCOPY;  Service: Cardiovascular;  Laterality: N/A;    Family History  Problem Relation Age of Onset   Hypertension Mother    Diabetes Mother    Cancer Mother        ? location   Colon cancer Father 26   Colon polyps Neg Hx    Liver cancer Neg Hx     Social History:  reports that he has never smoked. He has never used smokeless tobacco. He reports that he does not drink alcohol and does not use drugs.   Review of Systems  Lipid history: Has been on simvastatin 40 mg with variable control, followed by PCP However LDL is now 122   Lab Results  Component Value Date   CHOL 199 02/15/2023   CHOL 194 10/09/2021   CHOL 153 08/19/2020   Lab Results  Component Value Date   HDL 46.20 02/15/2023   HDL 52.10 10/09/2021   HDL 43.00 08/19/2020   Lab Results  Component Value Date   LDLCALC 122 (H) 02/15/2023   LDLCALC 111 (H) 10/09/2021   LDLCALC 84 08/19/2020   Lab Results  Component Value Date   TRIG 154.0 (H) 02/15/2023   TRIG 153.0 (H) 10/09/2021   TRIG 130.0 08/19/2020   Lab Results  Component Value Date   CHOLHDL 4 02/15/2023   CHOLHDL 4 10/09/2021    CHOLHDL 4 08/19/2020   Lab Results  Component Value Date   LDLDIRECT 169.0 12/02/2022   LDLDIRECT 115.0 07/02/2019   LDLDIRECT 118.0 11/11/2015           He has history of neuropathy, not symptomatic currently with only rare tingling  Most recent foot exam: 5/24  RENAL function is consistently normal with continuing Farxiga 10 mg  Lab Results  Component Value Date   CREATININE 1.15 02/15/2023   CREATININE 0.95 12/02/2022   CREATININE 1.07 09/03/2022    Hypertension: On losartan 25 mg prescribed by PCP  BP Readings from Last 3 Encounters:  02/22/23 124/76  12/07/22 122/88  09/21/22 128/84     May have diabetic shoes for his foot deformities    Physical Examination:  BP 124/76   Pulse 85   Ht 5' 11.75" (1.822 m)   Wt 178 lb 9.6 oz (81 kg)   SpO2 98%   BMI 24.39 kg/m   Diabetic Foot Exam - Simple   Simple Foot Form Diabetic Foot exam was performed with the following findings: Yes   Visual Inspection See comments: Yes Sensation Testing Intact to touch and monofilament testing bilaterally: Yes Pulse Check Posterior Tibialis and Dorsalis pulse intact bilaterally: Yes Comments Second toes on both feet are deviated medially and overlapping the big toes, prominence of first metatarsal joint laterally  ASSESSMENT:  Diabetes type 2 on insulin  See history of present illness for detailed discussion of current diabetes management, blood sugar patterns and problems identified   He is on a regimen of premixed insulin twice a day and Farxiga  His A1c is 10.8, about the same  His blood sugar is difficult to assess because of lack of glucose monitoring Not taking insulin regularly and difficult to make him comply better with this Likely is somewhat symptomatic from hyperglycemia including weight loss and nocturia  HYPERLIPIDEMIA: His lipids are still not controlled with persistently high LDL However he appears to be taking his simvastatin  regularly  PLAN:    He was told about the importance of taking his insulin consistently twice a day Also he can put a reminder on his refrigerator to take this consistently If he is not eating at home he can take his evening insulin with him For now he will take 40 units in the evening also Discussed blood sugar targets both before and after meals He will let us know if his blood sugars are consistently over 200 especially after meals  Will need to add ezetimibe to simvastatin for more effective lipid control  Patient Instructions  Check blood sugars on waking up 3 days a week  Also check blood sugars about 2 hours after meals and do this after different meals by rotation  Recommended blood sugar levels on waking up are 90-130 and about 2 hours after meal is 130-180  Please bring your blood sugar monitor to each visit, thank you  Put a reminder on your refrigerator to make sure you take your insulin twice a day before breakfast and dinner daily  Take 40 units before breakfast and also dinner Call if blood sugars are consistently over 200 are getting below 90  Also start taking ezetimibe with simvastatin for cholesterol       Reather Littler 02/22/2023, 8:41 AM   Note: This office note was prepared with Dragon voice recognition system technology. Any transcriptional errors that result from this process are unintentional.

## 2023-03-01 DIAGNOSIS — R3915 Urgency of urination: Secondary | ICD-10-CM | POA: Diagnosis not present

## 2023-03-10 ENCOUNTER — Encounter: Payer: Self-pay | Admitting: Gastroenterology

## 2023-03-10 ENCOUNTER — Telehealth: Payer: Self-pay

## 2023-03-10 ENCOUNTER — Ambulatory Visit (INDEPENDENT_AMBULATORY_CARE_PROVIDER_SITE_OTHER): Payer: 59 | Admitting: Gastroenterology

## 2023-03-10 VITALS — BP 110/70 | HR 88 | Ht 68.5 in | Wt 180.4 lb

## 2023-03-10 DIAGNOSIS — Z86010 Personal history of colonic polyps: Secondary | ICD-10-CM

## 2023-03-10 DIAGNOSIS — Z7901 Long term (current) use of anticoagulants: Secondary | ICD-10-CM | POA: Diagnosis not present

## 2023-03-10 MED ORDER — PLENVU 140 G PO SOLR: 1.0000 | Freq: Once | ORAL | 0 refills | Status: AC

## 2023-03-10 NOTE — Telephone Encounter (Signed)
   ARISTIDE SHEERIN 04-20-1946 [id number]  Dear Dr. Yetta Barre:  We have scheduled the above named patient for a colonoscopy procedure. Our records show that he is on anticoagulation therapy.  Please advise as to whether the patient may come off his therapy of Xarelto 2 days prior to their procedure which is scheduled for next Friday, 03-18-23.  Please route your response to Lucius Conn, CMA or fax response to [phone]. Thank you for your prompt reply.  Sincerely,    [facility]

## 2023-03-10 NOTE — Patient Instructions (Signed)
You have been scheduled for a colonoscopy. Please follow written instructions given to you at your visit today.  Please pick up your prep supplies at the pharmacy within the next 1-3 days. If you use inhalers (even only as needed), please bring them with you on the day of your procedure.  We are giving you a sample of Plenvu to use for your colonoscopy prep.  Take Miralax TWICE a day 5 days before the procedure.  You will be contacted by our office prior to your procedure for directions on holding your Xarelto.  If you do not hear from our office 1 week prior to your scheduled procedure, please call [phone] to discuss.  Thank you for entrusting me with your care and for choosing [facility], Dr. Ileene Patrick   If your blood pressure at your visit was 140/90 or greater, please contact your primary care physician to follow up on this. ______________________________________________________  If you are age 51 or older, your body mass index should be between 23-30. Your Body mass index is 27.03 kg/m. If this is out of the aforementioned range listed, please consider follow up with your Primary Care Provider.  If you are age 87 or younger, your body mass index should be between 19-25. Your Body mass index is 27.03 kg/m. If this is out of the aformentioned range listed, please consider follow up with your Primary Care Provider.  ________________________________________________________  The [facility] providers would like to encourage you to use [facility] to communicate with providers for non-urgent requests or questions.  Due to long hold times on the telephone, sending your provider a message by [facility] may be a faster and more efficient way to get a response.  Please allow 48 business hours for a response.  Please remember that this is for non-urgent requests.  _______________________________________________________  Due to recent changes in healthcare laws, you may see the  results of your imaging and laboratory studies on MyChart before your provider has had a chance to review them.  We understand that in some cases there may be results that are confusing or concerning to you. Not all laboratory results come back in the same time frame and the provider may be waiting for multiple results in order to interpret others.  Please give [place] 48 hours in order for your provider to thoroughly review all the results before contacting the office for clarification of your results.

## 2023-03-10 NOTE — Progress Notes (Signed)
HPI :  77 year old male with a history of DVT on chronic Xarelto, history of colon polyps, here to discuss repeat colonoscopy.   Recall he had a history of polyps over the years as outlined below.  In September 2017 he had a poor prep, 1 polyp was removed at that time which was an adenoma.  He had a double prep for his colonoscopy with me in February 2020, 9 polyps removed, most adenomas.  I had recommended a repeat exam 3 years later. His last exam was in Sept 2023. Double prep used for that exam. Prep was inadequate unfortunately.  He is here today to discuss if he wants to try another colonoscopy prior to stopping further surveillance.  He denies any cardiopulmonary symptoms or other major medical problems since have last seen him.  He states he moves his bowels regularly and denies constipation at baseline.  He has had difficulty with getting cleared out for preps in the past.  He is not sure if he ate food prior to the last prep.  He seems motivated to do another colonoscopy although gave him the option of stopping altogether giving nothing obvious noted on last exam, he was not comfortable with that and wants to perform a more exam.  He has ongoing issues with urinary incontinence, followed by primary care and urology.  He denies any blood in his stools.  No abdominal pains that bother him.  Otherwise feels well today.     Colonoscopy 11/21/2018: The perianal and digital rectal examinations were normal. - Five sessile polyps were found in the ascending colon. The polyps were 3 mm in size. These polyps were removed with a cold snare. Resection and retrieval were complete. - Four sessile polyps were found in the transverse colon. The polyps were 3 to 4 mm in size. These polyps were removed with a cold snare. Resection and retrieval were complete. - Scattered small-mouthed diverticula were found in the right colon and left colon. - There was evidence of a prior surgical anastomosis in the  rectum. This was characterized by healthy appearing mucosa. - The prep took time to lavage to achieve adequate views. The exam was otherwise without abnormality.   Surgical [P], ascending colon, transverse, polyp (9) - TUBULAR ADENOMA(S). - HYPERPLASTIC POLYP(S). - NO HIGH GRADE DYSPLASIA OR MALIGNANCY.     Colonoscopy 07/02/2016 - poor prep, 8mm polyp removed from ascending colon, diverticulosis,    Colonoscopy on 05/2013 - 5 flat or sessile polyps in the sigmoid and transverse colon, removed with forceps. Surgical anastomosis in sigmoid colon, diverticulosis. Path shows serrated adenoma of the transverse colon, with hyperplastic polyps in the left colon.    Colonoscopy 06/04/2022: DOUBLE PREP USED - The perianal and digital rectal examinations were normal. - There was evidence of a prior end-to-end colo-colonic anastomosis in the rectum. This was patent and was characterized by healthy appearing mucosa. - Scattered small-mouthed diverticula were found in the entire colon. - A large amount of semi-liquid stool was found in the entire colon, making visualization difficult. Cecum and dependant portions of the colon were most effected, while some segments were clear. Could not clear the cecum and some other areas with dependant stool, but no obvious large polyps or mass lesions noted. Lavage of the colon was performed using copious amounts of sterile water, resulting in incomplete clearance with fair visualization in some areas. - Internal hemorrhoids were found during retroflexion. The hemorrhoids were small. - The exam was otherwise without abnormality of what  was visualized. No polyps   Past Medical History:  Diagnosis Date   Clotting disorder (HCC)    DVT   Diabetes mellitus    type II   Diverticulosis    DVT (deep venous thrombosis) (HCC)    Glaucoma    no eye drops    Hepatic cyst    Hypercholesterolemia    Hypertension    Renal cyst    Sleep apnea    Tubulovillous adenoma     Urinary incontinence      Past Surgical History:  Procedure Laterality Date   BLADDER NECK RECONSTRUCTION  02/03/2012   Procedure: BLADDER NECK REPAIR;  Surgeon: Ardeth Sportsman, MD;  Location: [facility];  Service: General;;   COLONOSCOPY  02/2013   polyp removal(mult.)   KNEE SURGERY  2004   left   POLYPECTOMY     PROCTOSCOPY  02/03/2012   Procedure: PROCTOSCOPY;  Surgeon: Ardeth Sportsman, MD;  Location: [facility];  Service: General;;   STOMACH SURGERY  02/2012   TEE WITHOUT CARDIOVERSION N/A 10/05/2016   Procedure: TRANSESOPHAGEAL ECHOCARDIOGRAM (TEE);  Surgeon: Thurmon Fair, MD;  Location: [facility] END[facility];  Service: Cardiovascular;  Laterality: N/A;   Family History  Problem Relation Age of Onset   Hypertension Mother    Diabetes Mother    Cancer Mother        ? location   Colon cancer Father 56   Colon polyps Neg Hx    Liver cancer Neg Hx    Social History   Tobacco Use   Smoking status: Never   Smokeless tobacco: Never  Vaping Use   Vaping Use: Never used  Substance Use Topics   Alcohol use: No    Alcohol/week: 0.0 standard drinks of alcohol    Comment: once every two months   Drug use: No   Current Outpatient Medications  Medication Sig Dispense Refill   Alcohol Swabs PADS Use to clean area before insulin injections. DX E11.8 200 each 3   blood glucose meter kit and supplies KIT 1 each by Does not apply route daily as needed. Accu-Chek Aviva preferred by patient. Use up to four times daily as directed. (FOR ICD-9 250.00, 250.01).     Cholecalciferol 50 MCG (2000 UT) TABS Take 1 tablet (2,000 Units total) by mouth daily. 90 tablet 3   Continuous Blood Gluc Receiver (FREESTYLE LIBRE 2 READER) DEVI 1 Act by Does not apply route daily. 2 each 5   Continuous Blood Gluc Sensor (DEXCOM G7 SENSOR) MISC Change every 10 days 3 each 3   Continuous Blood Gluc Sensor (FREESTYLE LIBRE 2 SENSOR) MISC 1 Act by Does not apply route daily. 2 each 5   dapagliflozin propanediol (FARXIGA) 10 MG  TABS tablet Take 1 tablet (10 mg total) by mouth daily. 90 tablet 3   ezetimibe (ZETIA) 10 MG tablet Take 1 tablet (10 mg total) by mouth daily. 90 tablet 3   gabapentin (NEURONTIN) 300 MG capsule TAKE 1 CAPSULE(300 MG) BY MOUTH TWICE DAILY (Patient taking differently: Take 300 mg by mouth daily. TAKE 1 CAPSULE(300 MG) BY MOUTH TWICE DAILY) 180 capsule 1   glimepiride (AMARYL) 4 MG tablet TAKE 1 TABLET BY MOUTH DAILY  BEFORE BREAKFAST 100 tablet 0   Glucagon (GVOKE HYPOPEN 2-PACK) 1 MG/0.2ML SOAJ Inject 1 Act into the skin daily as needed. 2 mL 5   glucose blood test strip Use Accu Chek Aviva Test Strips as instructed to check blood sugar three times daily. 100 each 3  Insulin Lispro Prot & Lispro (HUMALOG MIX 75/25 KWIKPEN) (75-25) 100 UNIT/ML Kwikpen Inject 36 Units into the skin in the morning and at bedtime. (Patient taking differently: Inject 36 Units into the skin in the morning and at bedtime. 36 in morning and 30 at night) 66 mL 1   Insulin Pen Needle (B-D UF III MINI PEN NEEDLES) 31G X 5 MM MISC Inject 1 Act into the skin 2 (two) times daily. ADMINISTER 30 UNITS EVERY EVENING 100 each 2   ketoconazole (NIZORAL) 2 % cream Apply 1 application. topically 2 (two) times daily. 60 g 3   losartan (COZAAR) 25 MG tablet Take 1 tablet (25 mg total) by mouth daily. 90 tablet 1   simvastatin (ZOCOR) 40 MG tablet TAKE 1 TABLET BY MOUTH AT  BEDTIME 100 tablet 0   solifenacin (VESICARE) 10 MG tablet Take 1 tablet (10 mg total) by mouth daily. 90 tablet 1   tamsulosin (FLOMAX) 0.4 MG CAPS capsule Take 0.4 mg by mouth daily.     XARELTO 10 MG TABS tablet TAKE 1 TABLET BY MOUTH DAILY 100 tablet 0   No current facility-administered medications for this visit.   Allergies  Allergen Reactions   Metformin And Related Diarrhea   Codeine Rash    All over the body     Review of Systems: All systems reviewed and negative except where noted in HPI.   Lab Results  Component Value Date   WBC 4.8  09/21/2022   HGB 16.2 09/21/2022   HCT 48.4 09/21/2022   MCV 94.1 09/21/2022   PLT 196.0 09/21/2022    Lab Results  Component Value Date   CREATININE 1.15 02/15/2023   BUN 18 02/15/2023   NA 137 02/15/2023   K 3.9 02/15/2023   CL 100 02/15/2023   CO2 26 02/15/2023    Lab Results  Component Value Date   ALT 23 02/15/2023   AST 18 02/15/2023   ALKPHOS 66 02/15/2023   BILITOT 0.6 02/15/2023     Physical Exam: BP 110/70 (BP Location: Left Arm, Patient Position: Sitting, Cuff Size: Normal)   Pulse 88   Ht 5' 8.5" (1.74 m)   Wt 180 lb 6 oz (81.8 kg)   BMI 27.03 kg/m  Constitutional: Pleasant,well-developed, male in no acute distress. Neurological: Alert and oriented to person place and time. Psychiatric: Normal mood and affect. Behavior is normal.   ASSESSMENT: 77 y.o. male here for assessment of the following  1. History of colon polyps   2. Anticoagulated    Multiple polyps removed in the past, we were hoping to form his last colonoscopy last September and would not plan any further exams assuming no high risk lesions there.  Unfortunately his prep was inadequate despite double prep.  He cannot recall, I wonder if he ate food prior to that but he states he was completely compliant with drinking all prep.  I discussed options with him, up to him and he wants to try 1 more time for another colonoscopy using a different prep versus stopping further surveillance exams at this time given his age.  He would need to hold Xarelto for 2 days prior to another attempt.  Following discussion of this he strongly wants to be 1 more colonoscopy prior to stopping.  Will plan on giving him MiraLAX twice daily for 1 week prior to the exam.  We will use standard double prep with clear liquid diet for 48 hours prior to the exam.  Will also give an  additional half MiraLAX prep the night prior to the procedure.  Hopefully this regimen will work okay for him.  He understands and  agrees.  PLAN: - schedule colonoscopy at the [facility] - will ask for approval to hold Xarelto for 2 days - Miralax twice daily for one week prior to the exam - 2 day prep again - + additional 1/2 Miralax prep night prior - assuming no high risk lesions on this exam, it will be his last exam due to age  Harlin Rain, MD [facility]

## 2023-03-11 ENCOUNTER — Encounter: Payer: Self-pay | Admitting: Internal Medicine

## 2023-03-11 NOTE — Telephone Encounter (Signed)
Left voicemail for patient asking for return.

## 2023-03-14 NOTE — Telephone Encounter (Signed)
MyChart message to patient.  

## 2023-03-14 NOTE — Telephone Encounter (Signed)
Called and left detailed message for patient that he will need to hold Xarelto on Wed and Thursday of this week for procedure on Friday. Asked that he call back to confirm understanding

## 2023-03-15 NOTE — Telephone Encounter (Signed)
I spoke with Charles Parker and he got our message and verbalized understanding.

## 2023-03-15 NOTE — Telephone Encounter (Signed)
Called and left another detailed message for patient re: holding Xarelto tomorrow and Thursday for Fridays procedure.  Asked that he call me back asap to confirm.

## 2023-03-17 ENCOUNTER — Other Ambulatory Visit: Payer: 59

## 2023-03-18 ENCOUNTER — Encounter: Payer: Self-pay | Admitting: Gastroenterology

## 2023-03-18 ENCOUNTER — Ambulatory Visit (AMBULATORY_SURGERY_CENTER): Payer: 59 | Admitting: Gastroenterology

## 2023-03-18 VITALS — BP 113/65 | HR 66 | Temp 98.0°F | Resp 14 | Ht 68.5 in | Wt 180.0 lb

## 2023-03-18 DIAGNOSIS — Z86010 Personal history of colonic polyps: Secondary | ICD-10-CM | POA: Diagnosis not present

## 2023-03-18 DIAGNOSIS — Z09 Encounter for follow-up examination after completed treatment for conditions other than malignant neoplasm: Secondary | ICD-10-CM

## 2023-03-18 DIAGNOSIS — Z538 Procedure and treatment not carried out for other reasons: Secondary | ICD-10-CM | POA: Diagnosis not present

## 2023-03-18 MED ORDER — SODIUM CHLORIDE 0.9 % IV SOLN
500.0000 mL | Freq: Once | INTRAVENOUS | Status: DC
Start: 2023-03-18 — End: 2023-03-18

## 2023-03-18 NOTE — Patient Instructions (Signed)
Discharge instructions given. Procedure aborted due to poor prep. Per Dr. Adela Lank will not reschedule for now. Resume Xarelto today. YOU HAD AN ENDOSCOPIC PROCEDURE TODAY AT THE [facility]:   Refer to the procedure report that was given to you for any specific questions about what was found during the examination.  If the procedure report does not answer your questions, please call your gastroenterologist to clarify.  If you requested that your care partner not be given the details of your procedure findings, then the procedure report has been included in a sealed envelope for you to review at your convenience later.  YOU SHOULD EXPECT: Some feelings of bloating in the abdomen. Passage of more gas than usual.  Walking can help get rid of the air that was put into your GI tract during the procedure and reduce the bloating. If you had a lower endoscopy (such as a colonoscopy or flexible sigmoidoscopy) you may notice spotting of blood in your stool or on the toilet paper. If you underwent a bowel prep for your procedure, you may not have a normal bowel movement for a few days.  Please Note:  You might notice some irritation and congestion in your nose or some drainage.  This is from the oxygen used during your procedure.  There is no need for concern and it should clear up in a day or so.  SYMPTOMS TO REPORT IMMEDIATELY:  Following lower endoscopy (colonoscopy or flexible sigmoidoscopy):  Excessive amounts of blood in the stool  Significant tenderness or worsening of abdominal pains  Swelling of the abdomen that is new, acute  Fever of 100F or higher   For urgent or emergent issues, a gastroenterologist can be reached at any hour by calling [phone]. Do not use MyChart messaging for urgent concerns.    DIET:  We do recommend a small meal at first, but then you may proceed to your regular diet.  Drink plenty of fluids but you should avoid alcoholic beverages for 24  hours.  ACTIVITY:  You should plan to take it easy for the rest of today and you should NOT DRIVE or use heavy machinery until tomorrow (because of the sedation medicines used during the test).    FOLLOW UP: Our staff will call the number listed on your records the next business day following your procedure.  We will call around 7:15- 8:00 am to check on you and address any questions or concerns that you may have regarding the information given to you following your procedure. If we do not reach you, we will leave a message.     If any biopsies were taken you will be contacted by phone or by letter within the next 1-3 weeks.  Please call us at [phone] if you have not heard about the biopsies in 3 weeks.    SIGNATURES/CONFIDENTIALITY: You and/or your care partner have signed paperwork which will be entered into your electronic medical record.  These signatures attest to the fact that that the information above on your After Visit Summary has been reviewed and is understood.  Full responsibility of the confidentiality of this discharge information lies with you and/or your care-partner.

## 2023-03-18 NOTE — Op Note (Signed)
[facility] Patient Name: Charles Parker Procedure Date: 03/18/2023 11:18 AM MRN: [id number] Endoscopist: Viviann Spare P. Adela Lank , MD, [id number] Age: 77 Referring MD:  Date of Birth: 1946/09/19 Gender: Male Account #: [id number] Procedure:                Colonoscopy Indications:              High risk colon cancer surveillance: Personal                            history of colonic polyps - history of polyps                            removed in the past, but preps have been limited.                            Last exam 06/2022 with double prep and still limited                            with visualization but cecum intubated and no high                            risk lesions. Double prep + additional Miralax for                            this exam, Xarelto held for 2 days. Medicines:                Monitored Anesthesia Care Procedure:                Pre-Anesthesia Assessment:                           - Prior to the procedure, a History and Physical                            was performed, and patient medications and                            allergies were reviewed. The patient's tolerance of                            previous anesthesia was also reviewed. The risks                            and benefits of the procedure and the sedation                            options and risks were discussed with the patient.                            All questions were answered, and informed consent                            was obtained. Prior Anticoagulants: The patient has  taken Xarelto (rivaroxaban), last dose was 2 days                            prior to procedure. ASA Grade Assessment: III - A                            patient with severe systemic disease. After                            reviewing the risks and benefits, the patient was                            deemed in satisfactory condition to undergo the                             procedure.                           After obtaining informed consent, the colonoscope                            was passed under direct vision. Throughout the                            procedure, the patient's blood pressure, pulse, and                            oxygen saturations were monitored continuously. The                            CF [id number] #[id number] was introduced through the anus                            with the intention of advancing to the cecum. The                            scope was advanced to the sigmoid colon before the                            procedure was aborted. Medications were given. The                            colonoscopy was performed without difficulty. The                            patient tolerated the procedure well. The quality                            of the bowel preparation was poor. The rectum was                            photographed. Scope In: 11:26:02 AM Scope Out: 11:26:39 AM Total Procedure Duration: 0 hours 0 minutes 37 seconds  Findings:  The perianal and digital rectal examinations were                            normal.                           A large amount of semi-solid stool was found in the                            rectum and in the sigmoid colon, precluding                            visualization. Due to stool burden could not safely                            intubate the cecum or evaluate for polyps, the exam                            was aborted. Complications:            No immediate complications. Estimated blood loss:                            None. Estimated Blood Loss:     Estimated blood loss: none. Impression:               - Preparation of the colon was poor.                           - Stool in the rectum and in the sigmoid colon.                           - Exam aborted. Recommendation:           - Patient has a contact number available for                            emergencies. The  signs and symptoms of potential                            delayed complications were discussed with the                            patient. Return to normal activities tomorrow.                            Written discharge instructions were provided to the                            patient.                           - Resume previous diet.                           - Continue present medications.                           -  Resume Xarelto today                           - Will discuss with the patient - despite                            aggressive bowel prep and regimen prior to the                            exam, still not cleared out enough and in fact                            worse than the last time we tried this. He does                            admit to eating solid food yesterday. I think yield                            of repeated exams is likely low at this point. Viviann Spare P. Jhene Westmoreland, MD 03/18/2023 11:32:44 AM This report has been signed electronically.

## 2023-03-18 NOTE — Progress Notes (Signed)
VS completed by CW   Pt's states no medical or surgical changes since previsit or office visit.  

## 2023-03-18 NOTE — Progress Notes (Signed)
History and Physical Interval Note: Here for colonoscopy today. Drank 2 preps for this, although did eat food yesterday. Xarelto held for 2 days for this exam. He denies other complaints states he feels well and is ready for the exam. Otherwise no interval changes from office visit 03/10/23.    03/18/2023 11:17 AM  Charles Parker  has presented today for endoscopic procedure(s), with the diagnosis of  Encounter Diagnosis  Name Primary?   History of colon polyps Yes  .  The various methods of evaluation and treatment have been discussed with the patient and/or family. After consideration of risks, benefits and other options for treatment, the patient has consented to  the endoscopic procedure(s).   The patient's history has been reviewed, patient examined, no change in status, stable for surgery.  I have reviewed the patient's chart and labs.  Questions were answered to the patient's satisfaction.    Harlin Rain, MD [facility]

## 2023-03-18 NOTE — Progress Notes (Signed)
Uneventful anesthetic. Report to pacu rn. Vss. Care resumed by rn. 

## 2023-03-21 ENCOUNTER — Ambulatory Visit (INDEPENDENT_AMBULATORY_CARE_PROVIDER_SITE_OTHER): Payer: 59 | Admitting: Podiatrist

## 2023-03-21 ENCOUNTER — Telehealth: Payer: Self-pay | Admitting: *Deleted

## 2023-03-21 DIAGNOSIS — M2011 Hallux valgus (acquired), right foot: Secondary | ICD-10-CM | POA: Diagnosis not present

## 2023-03-21 DIAGNOSIS — M2012 Hallux valgus (acquired), left foot: Secondary | ICD-10-CM

## 2023-03-21 DIAGNOSIS — E1151 Type 2 diabetes mellitus with diabetic peripheral angiopathy without gangrene: Secondary | ICD-10-CM | POA: Diagnosis not present

## 2023-03-21 DIAGNOSIS — M205X2 Other deformities of toe(s) (acquired), left foot: Secondary | ICD-10-CM

## 2023-03-21 NOTE — Telephone Encounter (Signed)
Attempted to call patient for their post-procedure follow-up call. No answer. Left voicemail.   

## 2023-03-21 NOTE — Progress Notes (Signed)
   The patient presented to the office to day to pick up diabetic shoes and 3 pr diabetic custom inserts.  1 pr of  inserts were put in the shoes and the shoes were fitted to the patient.   The foot ortheses offered full contact with plantar surface and contoured the arch well.   The shoes fit well with no heel slippage or areas of pressure concern.   Instructions for break in and wear were dispensed as well as instructions for changing out the diabetic insoles every 4 months.  The  delivery documentation and break in instruction forms were signed and a copy of the paperwork was given to the patient.    Patient advised to wear the shoes only inside for 1 week and to contact us within 1 week of dispensing if there are any problems with the shoes.

## 2023-03-22 ENCOUNTER — Telehealth: Payer: Self-pay | Admitting: Internal Medicine

## 2023-03-22 NOTE — Telephone Encounter (Signed)
Patient dropped off document Handicap Placard, to be filled out by provider. Patient requested to send it via Call Patient to pick up within ASAP. Document is located in providers tray at front office.Please advise at [facility] [phone]

## 2023-03-23 NOTE — Telephone Encounter (Signed)
Pt has been informed that form was ready for pick up

## 2023-03-23 NOTE — Telephone Encounter (Signed)
Form given to PCP to sign 

## 2023-04-15 DIAGNOSIS — R3915 Urgency of urination: Secondary | ICD-10-CM | POA: Diagnosis not present

## 2023-04-19 ENCOUNTER — Other Ambulatory Visit: Payer: Self-pay | Admitting: Internal Medicine

## 2023-04-19 DIAGNOSIS — D6859 Other primary thrombophilia: Secondary | ICD-10-CM

## 2023-04-19 DIAGNOSIS — Z794 Long term (current) use of insulin: Secondary | ICD-10-CM

## 2023-04-19 DIAGNOSIS — E785 Hyperlipidemia, unspecified: Secondary | ICD-10-CM

## 2023-04-19 DIAGNOSIS — E1165 Type 2 diabetes mellitus with hyperglycemia: Secondary | ICD-10-CM

## 2023-04-25 ENCOUNTER — Other Ambulatory Visit: Payer: 59

## 2023-04-25 DIAGNOSIS — Z794 Long term (current) use of insulin: Secondary | ICD-10-CM | POA: Diagnosis not present

## 2023-04-25 DIAGNOSIS — E1165 Type 2 diabetes mellitus with hyperglycemia: Secondary | ICD-10-CM

## 2023-04-25 DIAGNOSIS — E7800 Pure hypercholesterolemia, unspecified: Secondary | ICD-10-CM | POA: Diagnosis not present

## 2023-04-25 LAB — BASIC METABOLIC PANEL
BUN: 13 mg/dL (ref 6–23)
CO2: 24 mEq/L (ref 19–32)
Calcium: 9.2 mg/dL (ref 8.4–10.5)
Chloride: 107 mEq/L (ref 96–112)
Creatinine, Ser: 0.97 mg/dL (ref 0.40–1.50)
GFR: 75.49 mL/min (ref >60.00)
Glucose, Bld: 155 mg/dL — ABNORMAL HIGH (ref 70–99)
Potassium: 3.7 mEq/L (ref 3.5–5.1)
Sodium: 142 mEq/L (ref 135–145)

## 2023-04-25 LAB — LDL CHOLESTEROL, DIRECT: Direct LDL: 104 mg/dL

## 2023-04-26 LAB — FRUCTOSAMINE: Fructosamine: 381 umol/L — ABNORMAL HIGH (ref 0–285)

## 2023-04-28 ENCOUNTER — Ambulatory Visit: Payer: 59 | Admitting: Endocrinology

## 2023-05-18 ENCOUNTER — Encounter: Payer: Self-pay | Admitting: Podiatry

## 2023-05-18 ENCOUNTER — Ambulatory Visit (INDEPENDENT_AMBULATORY_CARE_PROVIDER_SITE_OTHER): Payer: 59 | Admitting: Podiatry

## 2023-05-18 DIAGNOSIS — E1151 Type 2 diabetes mellitus with diabetic peripheral angiopathy without gangrene: Secondary | ICD-10-CM | POA: Diagnosis not present

## 2023-05-18 DIAGNOSIS — B351 Tinea unguium: Secondary | ICD-10-CM | POA: Diagnosis not present

## 2023-05-18 NOTE — Progress Notes (Signed)
This patient returns to my office for at risk foot care.  This patient requires this care by a professional since this patient will be at risk due to having diabetes type 2 and coagulation defect.  Patient is taking xarelto.  This patient is unable to cut nails himself since the patient cannot reach his nails.These nails are painful walking and wearing shoes.  This patient presents for at risk foot care today.  General Appearance  Alert, conversant and in no acute stress.  Vascular  Dorsalis pedis and posterior tibial  pulses are weakly palpable  bilaterally.  Capillary return is within normal limits  bilaterally. Temperature is within normal limits  bilaterally.  Neurologic  Senn-Weinstein monofilament wire test within normal limits  bilaterally. Muscle power within normal limits bilaterally.  Nails Thick disfigured discolored nails with subungual debris  from hallux to fifth toes bilaterally.  Orthopedic  No limitations of motion  feet .  No crepitus or effusions noted.  HAV with overlapping second digit  B/L.  Skin  normotropic skin with no porokeratosis noted bilaterally.  No signs of infections or ulcers noted.     Onychomycosis  Pain in right toes  Pain in left toes  Consent was obtained for treatment procedures.   Mechanical debridement of nails 1-5  bilaterally performed with a nail nipper.  Filed with dremel without incident.     Return office visit     3 months                Told patient to return for periodic foot care and evaluation due to potential at risk complications.   Gardiner Barefoot DPM

## 2023-05-31 ENCOUNTER — Ambulatory Visit (INDEPENDENT_AMBULATORY_CARE_PROVIDER_SITE_OTHER): Payer: 59 | Admitting: Endocrinology

## 2023-05-31 ENCOUNTER — Encounter: Payer: Self-pay | Admitting: Endocrinology

## 2023-05-31 VITALS — BP 140/80 | HR 102 | Ht 68.5 in | Wt 173.8 lb

## 2023-05-31 DIAGNOSIS — Z794 Long term (current) use of insulin: Secondary | ICD-10-CM

## 2023-05-31 DIAGNOSIS — E1165 Type 2 diabetes mellitus with hyperglycemia: Secondary | ICD-10-CM | POA: Diagnosis not present

## 2023-05-31 LAB — POCT GLYCOSYLATED HEMOGLOBIN (HGB A1C): Hemoglobin A1C: 9.5 % — AB (ref 4.0–5.6)

## 2023-05-31 NOTE — Patient Instructions (Addendum)
Continue Novolog Mix 40 units two times a day with breakfast and dinner.   Bring your glucometer in follow up visit.

## 2023-05-31 NOTE — Progress Notes (Signed)
Outpatient Endocrinology Note Charles Tehilla Coffel, MD  05/31/23  Patient's Name: Charles Parker    DOB: 1946-07-29    MRN: [id number]                                                    REASON OF VISIT: Follow up for type 2 diabetes mellitus  PCP: Etta Grandchild, MD  HISTORY OF PRESENT ILLNESS:   Charles Parker is a 77 y.o. old male with past medical history listed below, is here for follow up of type 2 diabetes mellitus.   Pertinent Diabetes History: Patient was diagnosed with type 2 diabetes mellitus in 2014.  He was on various antidiabetic medications since onset including metformin, Januvia and Amaryl.  He was also on Invokana for about a year in 2015.  His diabetes control has been variable with highest hemoglobin A1c was 13.6% in 2016.  He has mostly hemoglobin A1c in the range of 8.4 to 10.8%.  Chronic Diabetes Complications : Retinopathy: no. Last ophthalmology exam was done on annually, due.  Nephropathy: no, on losartan. Peripheral neuropathy: yes, tingling present. Coronary artery disease: no Stroke: no  Relevant comorbidities and cardiovascular risk factors: Obesity: no Body mass index is 26.04 kg/m.  Hypertension: yes Hyperlipidemia. Yes, on a statin.  Current / Home Diabetic regimen includes: Farxiga 10 mg daily. NovoLog mix 40 units in the morning and 40 units in the evening. Glimepiride 4mg  daily.   Prior diabetic medications: Metformin : Stopped due to hives /allergic reaction.  Glycemic data:   Not been checking blood sugar for few months.  Hypoglycemia: Patient has no hypoglycemic episodes. Patient has hypoglycemia awareness.  Factors modifying glucose control: 1.  Diabetic diet assessment: 3 meals a day.  2.  Staying active or exercising: No formal exercise.  3.  Medication compliance: compliant most of the time.  Interval history 05/31/23 Patient was last seen by Dr. Lucianne Muss in May 2024.  He has hemoglobin A1c 9.5% slightly improved.  He admits he  is not fully compliant with insulin however taking glimepiride and Farxiga regularly.  He has not been checking blood sugar.  No other complaints today.  REVIEW OF SYSTEMS As per history of present illness.   PAST MEDICAL HISTORY: Past Medical History:  Diagnosis Date   Clotting disorder (HCC)    DVT   Diabetes mellitus    type II   Diverticulosis    DVT (deep venous thrombosis) (HCC)    Glaucoma    no eye drops    Hepatic cyst    Hypercholesterolemia    Hypertension    Renal cyst    Sleep apnea    Tubulovillous adenoma    Urinary incontinence     PAST SURGICAL HISTORY: Past Surgical History:  Procedure Laterality Date   BLADDER NECK RECONSTRUCTION  02/03/2012   Procedure: BLADDER NECK REPAIR;  Surgeon: Ardeth Sportsman, MD;  Location: [facility];  Service: General;;   COLONOSCOPY  02/2013   polyp removal(mult.)   KNEE SURGERY  2004   left   POLYPECTOMY     PROCTOSCOPY  02/03/2012   Procedure: PROCTOSCOPY;  Surgeon: Ardeth Sportsman, MD;  Location: [facility];  Service: General;;   STOMACH SURGERY  02/2012   TEE WITHOUT CARDIOVERSION N/A 10/05/2016   Procedure: TRANSESOPHAGEAL ECHOCARDIOGRAM (TEE);  Surgeon: Thurmon Fair, MD;  Location: [facility];  Service: Cardiovascular;  Laterality: N/A;    ALLERGIES: Allergies  Allergen Reactions   Metformin And Related Diarrhea   Codeine Rash    All over the body    FAMILY HISTORY:  Family History  Problem Relation Age of Onset   Hypertension Mother    Diabetes Mother    Cancer Mother        ? location   Colon cancer Father 75   Colon polyps Neg Hx    Liver cancer Neg Hx     SOCIAL HISTORY: Social History   Socioeconomic History   Marital status: Widowed    Spouse name: Not on file   Number of children: 1   Years of education: 10   Highest education level: Not on file  Occupational History   Occupation: retired    Comment: coal mills  Tobacco Use   Smoking status: Never   Smokeless tobacco: Never  Vaping Use    Vaping status: Never Used  Substance and Sexual Activity   Alcohol use: No    Alcohol/week: 0.0 standard drinks of alcohol    Comment: once every two months   Drug use: No   Sexual activity: Not Currently  Other Topics Concern   Not on file  Social History Narrative   Patient lives at home alone.. Patient is retired. Patient has high school education.   Right handed.- Both   Caffeine- Coffee and soda   Social Determinants of Health   Financial Resource Strain: Low Risk  (08/17/2022)   Overall Financial Resource Strain (CARDIA)    Difficulty of Paying Living Expenses: Not hard at all  Food Insecurity: No Food Insecurity (08/17/2022)   Hunger Vital Sign    Worried About Running Out of Food in the Last Year: Never true    Ran Out of Food in the Last Year: Never true  Transportation Needs: No Transportation Needs (08/17/2022)   PRAPARE - Administrator, Civil Service (Medical): No    Lack of Transportation (Non-Medical): No  Physical Activity: Sufficiently Active (08/17/2022)   Exercise Vital Sign    Days of Exercise per Week: 7 days    Minutes of Exercise per Session: 30 min  Stress: No Stress Concern Present (08/17/2022)   [employer] of Occupational Health - Occupational Stress Questionnaire    Feeling of Stress : Not at all  Social Connections: Moderately Integrated (08/17/2022)   Social Connection and Isolation Panel [NHANES]    Frequency of Communication with Friends and Family: More than three times a week    Frequency of Social Gatherings with Friends and Family: More than three times a week    Attends Religious Services: More than 4 times per year    Active Member of [employer] or Organizations: Yes    Attends Banker Meetings: More than 4 times per year    Marital Status: Widowed    MEDICATIONS:  Current Outpatient Medications  Medication Sig Dispense Refill   Alcohol Swabs PADS Use to clean area before insulin injections. DX E11.8 200  each 3   blood glucose meter kit and supplies KIT 1 each by Does not apply route daily as needed. Accu-Chek Aviva preferred by patient. Use up to four times daily as directed. (FOR ICD-9 250.00, 250.01).     Cholecalciferol 50 MCG (2000 UT) TABS Take 1 tablet (2,000 Units total) by mouth daily. 90 tablet 3   dapagliflozin propanediol (FARXIGA) 10 MG TABS tablet Take 1 tablet (10  mg total) by mouth daily. 90 tablet 3   ezetimibe (ZETIA) 10 MG tablet Take 1 tablet (10 mg total) by mouth daily. 90 tablet 3   gabapentin (NEURONTIN) 300 MG capsule TAKE 1 CAPSULE(300 MG) BY MOUTH TWICE DAILY (Patient taking differently: Take 300 mg by mouth daily. TAKE 1 CAPSULE(300 MG) BY MOUTH TWICE DAILY) 180 capsule 1   glimepiride (AMARYL) 4 MG tablet Take 1 tablet (4 mg total) by mouth daily before breakfast. 100 tablet 0   Glucagon (GVOKE HYPOPEN 2-PACK) 1 MG/0.2ML SOAJ Inject 1 Act into the skin daily as needed. 2 mL 5   glucose blood test strip Use Accu Chek Aviva Test Strips as instructed to check blood sugar three times daily. 100 each 3   Insulin Lispro Prot & Lispro (HUMALOG MIX 75/25 KWIKPEN) (75-25) 100 UNIT/ML Kwikpen Inject 36 Units into the skin in the morning and at bedtime. (Patient taking differently: Inject 36 Units into the skin in the morning and at bedtime. 36 in morning and 30 at night) 66 mL 1   Insulin Pen Needle (B-D UF III MINI PEN NEEDLES) 31G X 5 MM MISC Inject 1 Act into the skin 2 (two) times daily. ADMINISTER 30 UNITS EVERY EVENING 100 each 2   ketoconazole (NIZORAL) 2 % cream Apply 1 application. topically 2 (two) times daily. 60 g 3   losartan (COZAAR) 25 MG tablet Take 1 tablet (25 mg total) by mouth daily. 90 tablet 1   rivaroxaban (XARELTO) 10 MG TABS tablet TAKE 1 TABLET BY MOUTH DAILY 100 tablet 0   simvastatin (ZOCOR) 40 MG tablet TAKE 1 TABLET BY MOUTH AT  BEDTIME 100 tablet 0   solifenacin (VESICARE) 10 MG tablet Take 1 tablet (10 mg total) by mouth daily. 90 tablet 1    tamsulosin (FLOMAX) 0.4 MG CAPS capsule Take 0.4 mg by mouth daily.     Continuous Blood Gluc Receiver (FREESTYLE LIBRE 2 READER) DEVI 1 Act by Does not apply route daily. (Patient not taking: Reported on 05/31/2023) 2 each 5   Continuous Blood Gluc Sensor (DEXCOM G7 SENSOR) MISC Change every 10 days (Patient not taking: Reported on 05/31/2023) 3 each 3   Continuous Blood Gluc Sensor (FREESTYLE LIBRE 2 SENSOR) MISC 1 Act by Does not apply route daily. (Patient not taking: Reported on 05/31/2023) 2 each 5   No current facility-administered medications for this visit.    PHYSICAL EXAM: Vitals:   05/31/23 0814  BP: (!) 140/80  Pulse: (!) 102  SpO2: 97%  Weight: 173 lb 12.8 oz (78.8 kg)  Height: 5' 8.5" (1.74 m)   Body mass index is 26.04 kg/m.  Wt Readings from Last 3 Encounters:  05/31/23 173 lb 12.8 oz (78.8 kg)  03/18/23 180 lb (81.6 kg)  03/10/23 180 lb 6 oz (81.8 kg)    General: Well developed, well nourished male in no apparent distress.  HEENT: AT/[place], no external lesions.  Eyes: Conjunctiva clear and no icterus. Neck: Neck supple  Lungs: Respirations not labored Neurologic: Alert, oriented, normal speech Extremities / Skin: Dry. No sores or rashes noted.  Psychiatric: Does not appear depressed or anxious  Diabetic Foot Exam - Simple   No data filed    LABS Reviewed Lab Results  Component Value Date   HGBA1C 9.5 (A) 05/31/2023   HGBA1C 10.8 (H) 02/15/2023   HGBA1C 10.3 (H) 12/02/2022   Lab Results  Component Value Date   FRUCTOSAMINE 381 (H) 04/25/2023   FRUCTOSAMINE 431 (H) 12/02/2022  FRUCTOSAMINE 369 (H) 02/23/2022   Lab Results  Component Value Date   CHOL 199 02/15/2023   HDL 46.20 02/15/2023   LDLCALC 122 (H) 02/15/2023   LDLDIRECT 104.0 04/25/2023   TRIG 154.0 (H) 02/15/2023   CHOLHDL 4 02/15/2023   Lab Results  Component Value Date   MICRALBCREAT 1.5 02/15/2023   MICRALBCREAT 0.6 02/23/2022   Lab Results  Component Value Date   CREATININE  0.97 04/25/2023   Lab Results  Component Value Date   GFR 75.49 04/25/2023    ASSESSMENT / PLAN  1. Uncontrolled type 2 diabetes mellitus with hyperglycemia, with long-term current use of insulin (HCC)     Diabetes Mellitus type 2, complicated by peripheral neuropathy. - Diabetic status / severity: Controlled  Lab Results  Component Value Date   HGBA1C 9.5 (A) 05/31/2023    - Hemoglobin A1c goal : <7.5%  Does about compliance with diabetes regimen including insulin.  - Medications: No change  I) continue NovoLog Mix 40 units 2 times a day with breakfast and dinner. II) continue glimepiride 4 mg daily. III) continue Farxiga 10 mg daily.  - Home glucose testing: 2-3 times a day.  Asked to bring the glucometer in the clinic follow-up visit next time. - Discussed/ Gave Hypoglycemia treatment plan.  # Consult : not required at this time.   # Annual urine for microalbuminuria/ creatinine ratio, no microalbuminuria currently, continue ACE/ARB /losartan. Last  Lab Results  Component Value Date   MICRALBCREAT 1.5 02/15/2023    # Foot check nightly / neuropathy.  # Annual dilated diabetic eye exams.   - Diet: Eat reasonable portion sizes to promote a healthy weight - Life style / activity / exercise: Discussed/as tolerated.  2. Blood pressure  -  BP Readings from Last 1 Encounters:  05/31/23 (!) 140/80    - Control is in target.  - No change in current plans.  3. Lipid status / Hyperlipidemia - Last  Lab Results  Component Value Date   LDLCALC 122 (H) 02/15/2023   - Continue simvastatin 40 mg daily.  Zetia 10 mg daily.  Diagnoses and all orders for this visit:  Uncontrolled type 2 diabetes mellitus with hyperglycemia, with long-term current use of insulin (HCC) -     POCT glycosylated hemoglobin (Hb A1C)    DISPOSITION Follow up in clinic in 6 weeks suggested.   All questions answered and patient verbalized understanding of the plan.  Charles Kendarrius Tanzi,  MD [facility] Endocrin[facility] [address] [place], [address] [place], [place] [address] Phone # [phone]  At least part of this note was generated using voice recognition software. Inadvertent word errors may have occurred, which were not recognized during the proofreading process.

## 2023-06-28 ENCOUNTER — Other Ambulatory Visit: Payer: Self-pay | Admitting: Internal Medicine

## 2023-06-28 DIAGNOSIS — E1165 Type 2 diabetes mellitus with hyperglycemia: Secondary | ICD-10-CM

## 2023-06-28 DIAGNOSIS — E785 Hyperlipidemia, unspecified: Secondary | ICD-10-CM

## 2023-06-28 DIAGNOSIS — Z794 Long term (current) use of insulin: Secondary | ICD-10-CM

## 2023-06-28 DIAGNOSIS — D6859 Other primary thrombophilia: Secondary | ICD-10-CM

## 2023-07-12 ENCOUNTER — Encounter: Payer: Self-pay | Admitting: Endocrinology

## 2023-07-12 ENCOUNTER — Ambulatory Visit (INDEPENDENT_AMBULATORY_CARE_PROVIDER_SITE_OTHER): Payer: 59 | Admitting: Endocrinology

## 2023-07-12 VITALS — BP 117/63 | HR 85 | Ht 68.5 in | Wt 175.4 lb

## 2023-07-12 DIAGNOSIS — Z794 Long term (current) use of insulin: Secondary | ICD-10-CM

## 2023-07-12 DIAGNOSIS — E1165 Type 2 diabetes mellitus with hyperglycemia: Secondary | ICD-10-CM

## 2023-07-12 NOTE — Progress Notes (Signed)
Outpatient Endocrinology Note [place] Hermie Reagor, MD  07/12/23  Patient's Name: Charles Parker    DOB: 08-21-1946    MRN: [id number]                                                    REASON OF VISIT: Follow up for type 2 diabetes mellitus  PCP: Etta Grandchild, MD  HISTORY OF PRESENT ILLNESS:   Charles Parker is a 76 y.o. old male with past medical history listed below, is here for follow up of type 2 diabetes mellitus.   Pertinent Diabetes History: Patient was diagnosed with type 2 diabetes mellitus in 2014.  He was on various antidiabetic medications since onset including metformin, Januvia and Amaryl.  He was also on Invokana for about a year in 2015.  His diabetes control has been variable with highest hemoglobin A1c was 13.6% in 2016.  He has mostly hemoglobin A1c in the range of 8.4 to 10.8%.  Chronic Diabetes Complications : Retinopathy: no. Last ophthalmology exam was done on annually, due.  Nephropathy: no, on losartan. Peripheral neuropathy: yes, tingling present. Coronary artery disease: no Stroke: no  Relevant comorbidities and cardiovascular risk factors: Obesity: no Body mass index is 26.28 kg/m.  Hypertension: yes Hyperlipidemia. Yes, on a statin.  Current / Home Diabetic regimen includes: Farxiga 10 mg daily. NovoLog mix 40 units in the morning and 40 units in the evening. Glimepiride 4mg  daily.   Prior diabetic medications: Metformin : Stopped due to hives /allergic reaction.  Glycemic data:   Not able to download glucometer blood sugar daily reviewed from meter and noted as follows.  He has been checking glucose at different times of the day in the morning fasting in the afternoon and at bedtime.  158, 308, 288, 93, 71, 100, 124, 86, 215, 209, 146, 115, 79, 83, 125, 89, 327, 138, 181, 133, 121, 123, 433, 160, 213, 142.  Hypoglycemia: Patient has no hypoglycemic episodes. Patient has hypoglycemia awareness.  Factors modifying glucose control: 1.  Diabetic  diet assessment: 3 meals a day.  2.  Staying active or exercising: No formal exercise.  3.  Medication compliance: compliant most of the time.  Interval history 07/12/23 Glucometer data as reviewed above.  Mostly acceptable blood sugar with occasional hyperglycemia and no hypoglycemia.  Patient reports he has been feeling better.  He reports he is compliant with taking insulin for meals once in a while.  No other complaints today.  REVIEW OF SYSTEMS As per history of present illness.   PAST MEDICAL HISTORY: Past Medical History:  Diagnosis Date   Clotting disorder (HCC)    DVT   Diabetes mellitus    type II   Diverticulosis    DVT (deep venous thrombosis) (HCC)    Glaucoma    no eye drops    Hepatic cyst    Hypercholesterolemia    Hypertension    Renal cyst    Sleep apnea    Tubulovillous adenoma    Urinary incontinence     PAST SURGICAL HISTORY: Past Surgical History:  Procedure Laterality Date   BLADDER NECK RECONSTRUCTION  02/03/2012   Procedure: BLADDER NECK REPAIR;  Surgeon: Ardeth Sportsman, MD;  Location: [facility];  Service: General;;   COLONOSCOPY  02/2013   polyp removal(mult.)   KNEE SURGERY  2004  left   POLYPECTOMY     PROCTOSCOPY  02/03/2012   Procedure: PROCTOSCOPY;  Surgeon: Ardeth Sportsman, MD;  Location: [facility];  Service: General;;   STOMACH SURGERY  02/2012   TEE WITHOUT CARDIOVERSION N/A 10/05/2016   Procedure: TRANSESOPHAGEAL ECHOCARDIOGRAM (TEE);  Surgeon: Thurmon Fair, MD;  Location: [facility];  Service: Cardiovascular;  Laterality: N/A;    ALLERGIES: Allergies  Allergen Reactions   Metformin And Related Diarrhea   Codeine Rash    All over the body    FAMILY HISTORY:  Family History  Problem Relation Age of Onset   Hypertension Mother    Diabetes Mother    Cancer Mother        ? location   Colon cancer Father 34   Colon polyps Neg Hx    Liver cancer Neg Hx     SOCIAL HISTORY: Social History   Socioeconomic History   Marital  status: Widowed    Spouse name: Not on file   Number of children: 1   Years of education: 40   Highest education level: Not on file  Occupational History   Occupation: retired    Comment: coal mills  Tobacco Use   Smoking status: Never   Smokeless tobacco: Never  Vaping Use   Vaping status: Never Used  Substance and Sexual Activity   Alcohol use: No    Alcohol/week: 0.0 standard drinks of alcohol    Comment: once every two months   Drug use: No   Sexual activity: Not Currently  Other Topics Concern   Not on file  Social History Narrative   Patient lives at home alone.. Patient is retired. Patient has high school education.   Right handed.- Both   Caffeine- Coffee and soda   Social Determinants of Health   Financial Resource Strain: Low Risk  (08/17/2022)   Overall Financial Resource Strain (CARDIA)    Difficulty of Paying Living Expenses: Not hard at all  Food Insecurity: No Food Insecurity (08/17/2022)   Hunger Vital Sign    Worried About Running Out of Food in the Last Year: Never true    Ran Out of Food in the Last Year: Never true  Transportation Needs: No Transportation Needs (08/17/2022)   PRAPARE - Administrator, Civil Service (Medical): No    Lack of Transportation (Non-Medical): No  Physical Activity: Sufficiently Active (08/17/2022)   Exercise Vital Sign    Days of Exercise per Week: 7 days    Minutes of Exercise per Session: 30 min  Stress: No Stress Concern Present (08/17/2022)   [employer] of Occupational Health - Occupational Stress Questionnaire    Feeling of Stress : Not at all  Social Connections: Moderately Integrated (08/17/2022)   Social Connection and Isolation Panel [NHANES]    Frequency of Communication with Friends and Family: More than three times a week    Frequency of Social Gatherings with Friends and Family: More than three times a week    Attends Religious Services: More than 4 times per year    Active Member of [employer]  or Organizations: Yes    Attends Banker Meetings: More than 4 times per year    Marital Status: Widowed    MEDICATIONS:  Current Outpatient Medications  Medication Sig Dispense Refill   Alcohol Swabs PADS Use to clean area before insulin injections. DX E11.8 200 each 3   blood glucose meter kit and supplies KIT 1 each by Does not apply route daily as  needed. Accu-Chek Aviva preferred by patient. Use up to four times daily as directed. (FOR ICD-9 250.00, 250.01).     Cholecalciferol 50 MCG (2000 UT) TABS Take 1 tablet (2,000 Units total) by mouth daily. 90 tablet 3   dapagliflozin propanediol (FARXIGA) 10 MG TABS tablet Take 1 tablet (10 mg total) by mouth daily. 90 tablet 3   ezetimibe (ZETIA) 10 MG tablet Take 1 tablet (10 mg total) by mouth daily. 90 tablet 3   gabapentin (NEURONTIN) 300 MG capsule TAKE 1 CAPSULE(300 MG) BY MOUTH TWICE DAILY (Patient taking differently: Take 300 mg by mouth daily. TAKE 1 CAPSULE(300 MG) BY MOUTH TWICE DAILY) 180 capsule 1   glimepiride (AMARYL) 4 MG tablet Take 1 tablet (4 mg total) by mouth daily before breakfast. 100 tablet 0   Glucagon (GVOKE HYPOPEN 2-PACK) 1 MG/0.2ML SOAJ Inject 1 Act into the skin daily as needed. 2 mL 5   glucose blood test strip Use Accu Chek Aviva Test Strips as instructed to check blood sugar three times daily. 100 each 3   Insulin Lispro Prot & Lispro (HUMALOG MIX 75/25 KWIKPEN) (75-25) 100 UNIT/ML Kwikpen Inject 36 Units into the skin in the morning and at bedtime. (Patient taking differently: Inject 36 Units into the skin in the morning and at bedtime. 36 in morning and 30 at night) 66 mL 1   Insulin Pen Needle (B-D UF III MINI PEN NEEDLES) 31G X 5 MM MISC Inject 1 Act into the skin 2 (two) times daily. ADMINISTER 30 UNITS EVERY EVENING 100 each 2   ketoconazole (NIZORAL) 2 % cream Apply 1 application. topically 2 (two) times daily. 60 g 3   losartan (COZAAR) 25 MG tablet Take 1 tablet (25 mg total) by mouth daily. 90  tablet 1   rivaroxaban (XARELTO) 10 MG TABS tablet TAKE 1 TABLET BY MOUTH DAILY 100 tablet 0   simvastatin (ZOCOR) 40 MG tablet TAKE 1 TABLET BY MOUTH AT  BEDTIME 100 tablet 0   solifenacin (VESICARE) 10 MG tablet Take 1 tablet (10 mg total) by mouth daily. 90 tablet 1   tamsulosin (FLOMAX) 0.4 MG CAPS capsule Take 0.4 mg by mouth daily.     Continuous Blood Gluc Receiver (FREESTYLE LIBRE 2 READER) DEVI 1 Act by Does not apply route daily. (Patient not taking: Reported on 05/31/2023) 2 each 5   Continuous Blood Gluc Sensor (DEXCOM G7 SENSOR) MISC Change every 10 days (Patient not taking: Reported on 05/31/2023) 3 each 3   Continuous Blood Gluc Sensor (FREESTYLE LIBRE 2 SENSOR) MISC 1 Act by Does not apply route daily. (Patient not taking: Reported on 05/31/2023) 2 each 5   No current facility-administered medications for this visit.    PHYSICAL EXAM: Vitals:   07/12/23 1042  BP: 117/63  Pulse: 85  SpO2: 96%  Weight: 175 lb 6.4 oz (79.6 kg)  Height: 5' 8.5" (1.74 m)    Body mass index is 26.28 kg/m.  Wt Readings from Last 3 Encounters:  07/12/23 175 lb 6.4 oz (79.6 kg)  05/31/23 173 lb 12.8 oz (78.8 kg)  03/18/23 180 lb (81.6 kg)    General: Well developed, well nourished male in no apparent distress.  HEENT: AT/[place], no external lesions.  Eyes: Conjunctiva clear and no icterus. Neck: Neck supple  Lungs: Respirations not labored Neurologic: Alert, oriented, normal speech Extremities / Skin: Dry.   Psychiatric: Does not appear depressed or anxious  Diabetic Foot Exam - Simple   No data filed  LABS Reviewed Lab Results  Component Value Date   HGBA1C 9.5 (A) 05/31/2023   HGBA1C 10.8 (H) 02/15/2023   HGBA1C 10.3 (H) 12/02/2022   Lab Results  Component Value Date   FRUCTOSAMINE 381 (H) 04/25/2023   FRUCTOSAMINE 431 (H) 12/02/2022   FRUCTOSAMINE 369 (H) 02/23/2022   Lab Results  Component Value Date   CHOL 199 02/15/2023   HDL 46.20 02/15/2023   LDLCALC 122 (H)  02/15/2023   LDLDIRECT 104.0 04/25/2023   TRIG 154.0 (H) 02/15/2023   CHOLHDL 4 02/15/2023   Lab Results  Component Value Date   MICRALBCREAT 1.5 02/15/2023   MICRALBCREAT 0.6 02/23/2022   Lab Results  Component Value Date   CREATININE 0.97 04/25/2023   Lab Results  Component Value Date   GFR 75.49 04/25/2023    ASSESSMENT / PLAN  1. Uncontrolled type 2 diabetes mellitus with hyperglycemia, with long-term current use of insulin (HCC)      Diabetes Mellitus type 2, complicated by peripheral neuropathy. - Diabetic status / severity: Controlled  Lab Results  Component Value Date   HGBA1C 9.5 (A) 05/31/2023    - Hemoglobin A1c goal : <7.5%  Glucometer data blood sugar has been improving.  No hypoglycemia. - Medications: No change  I) continue NovoLog Mix 40 units 2 times a day with breakfast and dinner. II) continue glimepiride 4 mg daily. III) continue Farxiga 10 mg daily.  - Home glucose testing: 2-3 times a day.  Asked to bring the glucometer in the clinic follow-up visit next time. - Discussed/ Gave Hypoglycemia treatment plan.  # Consult : not required at this time.   # Annual urine for microalbuminuria/ creatinine ratio, no microalbuminuria currently, continue ACE/ARB /losartan. Last  Lab Results  Component Value Date   MICRALBCREAT 1.5 02/15/2023    # Foot check nightly / neuropathy.  # Annual dilated diabetic eye exams.  Advised to have diabetic eye exam.  - Diet: Eat reasonable portion sizes to promote a healthy weight - Life style / activity / exercise: Discussed/as tolerated.  2. Blood pressure  -  BP Readings from Last 1 Encounters:  07/12/23 117/63    - Control is in target.  - No change in current plans.  3. Lipid status / Hyperlipidemia - Last  Lab Results  Component Value Date   LDLCALC 122 (H) 02/15/2023   - Continue simvastatin 40 mg daily.  Zetia 10 mg daily.  Diagnoses and all orders for this visit:  Uncontrolled type 2  diabetes mellitus with hyperglycemia, with long-term current use of insulin (HCC)     DISPOSITION Follow up in clinic in 3 months suggested.   All questions answered and patient verbalized understanding of the plan.  [place]ra[place] Avarae Zwart, MD [facility] Endocrinology [facility] [address] [place], [address] [place], [place] [address] Phone # [phone]  At least part of this note was generated using voice recognition software. Inadvertent word errors may have occurred, which were not recognized during the proofreading process.

## 2023-07-25 ENCOUNTER — Telehealth: Payer: Self-pay | Admitting: Internal Medicine

## 2023-07-25 NOTE — Telephone Encounter (Signed)
Pt was in a surgery back in 2017 for his back. Pt states he still have the plates and screws in his back and he's in a lot of pain in his shoulders and down his back. He was wondering if he could get to see you for this a little earlier than the end of NOV which is your next available slot. Pt last visit was back in dec 2023. Please advise.

## 2023-07-25 NOTE — Telephone Encounter (Signed)
Called patient to try and get him scheduled. Was unable to reach the patient. LMTRC

## 2023-07-26 NOTE — Telephone Encounter (Signed)
Appointment has been changed 10/24 at 10:40 am

## 2023-07-26 NOTE — Telephone Encounter (Signed)
[facility], [place] -- [phone] You will have to call his brother that's on his emergency contact.

## 2023-07-26 NOTE — Telephone Encounter (Signed)
He's scheduled for Nov 11th @9 :00 If he can be seen earlier than that if Dr. Yetta Barre approves please let us know.Marland Kitchen

## 2023-07-28 ENCOUNTER — Ambulatory Visit (INDEPENDENT_AMBULATORY_CARE_PROVIDER_SITE_OTHER): Payer: 59 | Admitting: Internal Medicine

## 2023-07-28 ENCOUNTER — Ambulatory Visit: Payer: 59

## 2023-07-28 ENCOUNTER — Encounter: Payer: Self-pay | Admitting: Internal Medicine

## 2023-07-28 VITALS — BP 126/84 | HR 90 | Temp 97.7°F | Resp 16 | Ht 68.5 in | Wt 170.8 lb

## 2023-07-28 DIAGNOSIS — M5030 Other cervical disc degeneration, unspecified cervical region: Secondary | ICD-10-CM | POA: Insufficient documentation

## 2023-07-28 DIAGNOSIS — M542 Cervicalgia: Secondary | ICD-10-CM

## 2023-07-28 DIAGNOSIS — I10 Essential (primary) hypertension: Secondary | ICD-10-CM

## 2023-07-28 LAB — CBC WITH DIFFERENTIAL/PLATELET
Basophils Absolute: 0 10*3/uL (ref 0.0–0.1)
Basophils Relative: 0.6 % (ref 0.0–3.0)
Eosinophils Absolute: 0 10*3/uL (ref 0.0–0.7)
Eosinophils Relative: 0.6 % (ref 0.0–5.0)
HCT: 42.7 % (ref 39.0–52.0)
Hemoglobin: 13.7 g/dL (ref 13.0–17.0)
Lymphocytes Relative: 18.5 % (ref 12.0–46.0)
Lymphs Abs: 1.2 10*3/uL (ref 0.7–4.0)
MCHC: 32.1 g/dL (ref 30.0–36.0)
MCV: 92.5 fL (ref 78.0–100.0)
Monocytes Absolute: 0.6 10*3/uL (ref 0.1–1.0)
Monocytes Relative: 9.3 % (ref 3.0–12.0)
Neutro Abs: 4.5 10*3/uL (ref 1.4–7.7)
Neutrophils Relative %: 71 % (ref 43.0–77.0)
Platelets: 322 10*3/uL (ref 150.0–400.0)
RBC: 4.62 Mil/uL (ref 4.22–5.81)
RDW: 13.5 % (ref 11.5–15.5)
WBC: 6.4 10*3/uL (ref 4.0–10.5)

## 2023-07-28 LAB — C-REACTIVE PROTEIN: CRP: 5.3 mg/dL (ref 0.5–20.0)

## 2023-07-28 MED ORDER — TRAMADOL HCL ER 100 MG PO TB24
100.0000 mg | ORAL_TABLET | Freq: Every day | ORAL | 0 refills | Status: AC
Start: 2023-07-28 — End: ?

## 2023-07-28 NOTE — Progress Notes (Signed)
Subjective:  Patient ID: Charles Parker, male    DOB: January 06, 1946  Age: 77 y.o. MRN: [id number]  CC: Hypertension   HPI Charles Parker presents for f/up ----  Discussed the use of AI scribe software for clinical note transcription with the patient, who gave verbal consent to proceed.  History of Present Illness   The patient, with a history of spinal surgery involving the placement of two rods and eight screws in 2017, presents with neck pain. The pain radiates to both shoulders, radiating down the right side. The discomfort is severe enough to disrupt sleep, with the patient only finding relief when lying on the right side.  There is associated tingling in the fingers, which the patient describes as intermittent. The patient also reports a sensation of feeling the rods in the back. There is a reported weakness in the arms, although it is transient.  In addition to the musculoskeletal symptoms, the patient describes a full-body shaking sensation, particularly noticeable when cold. This has been ongoing for approximately one to two weeks. The patient denies any chest pain, shortness of breath, dizziness, or lightheadedness. The only medication taken for pain is aspirin.       Outpatient Medications Prior to Visit  Medication Sig Dispense Refill   Alcohol Swabs PADS Use to clean area before insulin injections. DX E11.8 200 each 3   blood glucose meter kit and supplies KIT 1 each by Does not apply route daily as needed. Accu-Chek Aviva preferred by patient. Use up to four times daily as directed. (FOR ICD-9 250.00, 250.01).     Cholecalciferol 50 MCG (2000 UT) TABS Take 1 tablet (2,000 Units total) by mouth daily. 90 tablet 3   dapagliflozin propanediol (FARXIGA) 10 MG TABS tablet Take 1 tablet (10 mg total) by mouth daily. 90 tablet 3   ezetimibe (ZETIA) 10 MG tablet Take 1 tablet (10 mg total) by mouth daily. 90 tablet 3   gabapentin (NEURONTIN) 300 MG capsule TAKE 1 CAPSULE(300 MG) BY  MOUTH TWICE DAILY (Patient taking differently: Take 300 mg by mouth daily. TAKE 1 CAPSULE(300 MG) BY MOUTH TWICE DAILY) 180 capsule 1   glimepiride (AMARYL) 4 MG tablet Take 1 tablet (4 mg total) by mouth daily before breakfast. 100 tablet 0   Glucagon (GVOKE HYPOPEN 2-PACK) 1 MG/0.2ML SOAJ Inject 1 Act into the skin daily as needed. 2 mL 5   glucose blood test strip Use Accu Chek Aviva Test Strips as instructed to check blood sugar three times daily. 100 each 3   Insulin Lispro Prot & Lispro (HUMALOG MIX 75/25 KWIKPEN) (75-25) 100 UNIT/ML Kwikpen Inject 36 Units into the skin in the morning and at bedtime. (Patient taking differently: Inject 36 Units into the skin in the morning and at bedtime. 36 in morning and 30 at night) 66 mL 1   Insulin Pen Needle (B-D UF III MINI PEN NEEDLES) 31G X 5 MM MISC Inject 1 Act into the skin 2 (two) times daily. ADMINISTER 30 UNITS EVERY EVENING 100 each 2   ketoconazole (NIZORAL) 2 % cream Apply 1 application. topically 2 (two) times daily. 60 g 3   losartan (COZAAR) 25 MG tablet Take 1 tablet (25 mg total) by mouth daily. 90 tablet 1   rivaroxaban (XARELTO) 10 MG TABS tablet TAKE 1 TABLET BY MOUTH DAILY 100 tablet 0   simvastatin (ZOCOR) 40 MG tablet TAKE 1 TABLET BY MOUTH AT  BEDTIME 100 tablet 0   solifenacin (VESICARE) 10 MG tablet  Take 1 tablet (10 mg total) by mouth daily. 90 tablet 1   tamsulosin (FLOMAX) 0.4 MG CAPS capsule Take 0.4 mg by mouth daily.     Continuous Blood Gluc Receiver (FREESTYLE LIBRE 2 READER) DEVI 1 Act by Does not apply route daily. (Patient not taking: Reported on 07/28/2023) 2 each 5   Continuous Blood Gluc Sensor (DEXCOM G7 SENSOR) MISC Change every 10 days (Patient not taking: Reported on 05/31/2023) 3 each 3   Continuous Blood Gluc Sensor (FREESTYLE LIBRE 2 SENSOR) MISC 1 Act by Does not apply route daily. (Patient not taking: Reported on 07/28/2023) 2 each 5   No facility-administered medications prior to visit.    ROS Review  of Systems  Constitutional:  Negative for appetite change, chills, fatigue and fever.  HENT: Negative.  Negative for sore throat and trouble swallowing.   Eyes: Negative.   Respiratory: Negative.  Negative for cough, chest tightness, shortness of breath and wheezing.   Cardiovascular:  Negative for chest pain, palpitations and leg swelling.  Gastrointestinal:  Negative for abdominal pain, constipation, diarrhea, nausea and vomiting.  Endocrine: Negative.   Genitourinary: Negative.  Negative for difficulty urinating.  Musculoskeletal:  Positive for back pain and neck pain. Negative for myalgias.  Neurological:  Positive for tremors, weakness and numbness. Negative for dizziness and light-headedness.  Hematological:  Negative for adenopathy. Does not bruise/bleed easily.  Psychiatric/Behavioral:  Positive for confusion and decreased concentration.     Objective:  BP 126/84 (BP Location: Left Arm, Patient Position: Sitting, Cuff Size: Normal)   Pulse 90   Temp 97.7 F (36.5 C) (Oral)   Resp 16   Ht 5' 8.5" (1.74 m)   Wt 170 lb 12.8 oz (77.5 kg)   SpO2 95%   BMI 25.59 kg/m   BP Readings from Last 3 Encounters:  07/28/23 126/84  07/12/23 117/63  05/31/23 (!) 140/80    Wt Readings from Last 3 Encounters:  07/28/23 170 lb 12.8 oz (77.5 kg)  07/12/23 175 lb 6.4 oz (79.6 kg)  05/31/23 173 lb 12.8 oz (78.8 kg)    Physical Exam Vitals reviewed.  Constitutional:      Appearance: Normal appearance.  HENT:     Mouth/Throat:     Mouth: Mucous membranes are moist.  Eyes:     General: No scleral icterus.    Conjunctiva/sclera: Conjunctivae normal.  Cardiovascular:     Rate and Rhythm: Normal rate and regular rhythm.     Heart sounds: No murmur heard.    Comments: EKG- NSR, 84 bpm Minimal LVH No Q waves or ST/T wave changes Pulmonary:     Effort: Pulmonary effort is normal.     Breath sounds: No stridor. No wheezing, rhonchi or rales.  Abdominal:     General: Abdomen is  flat.     Palpations: There is no mass.     Tenderness: There is no abdominal tenderness. There is no guarding.     Hernia: No hernia is present.  Musculoskeletal:        General: Normal range of motion.     Cervical back: Neck supple.     Right lower leg: No edema.     Left lower leg: No edema.  Lymphadenopathy:     Cervical: No cervical adenopathy.  Skin:    General: Skin is warm.  Neurological:     Cranial Nerves: No cranial nerve deficit.     Sensory: No sensory deficit.     Motor: No weakness.  Coordination: Romberg sign negative. Coordination normal.     Gait: Gait normal.     Deep Tendon Reflexes: Reflexes normal.     Reflex Scores:      Tricep reflexes are 1+ on the right side and 1+ on the left side.      Bicep reflexes are 1+ on the right side and 1+ on the left side.      Brachioradialis reflexes are 1+ on the right side and 1+ on the left side.      Patellar reflexes are 2+ on the right side and 2+ on the left side.      Achilles reflexes are 0 on the right side and 0 on the left side. Psychiatric:        Mood and Affect: Mood normal.        Behavior: Behavior normal.     Lab Results  Component Value Date   WBC 6.4 07/28/2023   HGB 13.7 07/28/2023   HCT 42.7 07/28/2023   PLT 322.0 07/28/2023   GLUCOSE 155 (H) 04/25/2023   CHOL 199 02/15/2023   TRIG 154.0 (H) 02/15/2023   HDL 46.20 02/15/2023   LDLDIRECT 104.0 04/25/2023   LDLCALC 122 (H) 02/15/2023   ALT 23 02/15/2023   AST 18 02/15/2023   NA 142 04/25/2023   K 3.7 04/25/2023   CL 107 04/25/2023   CREATININE 0.97 04/25/2023   BUN 13 04/25/2023   CO2 24 04/25/2023   TSH 1.45 09/21/2022   PSA 2.22 09/21/2022   INR 1.37 09/26/2016   HGBA1C 9.5 (A) 05/31/2023   MICROALBUR <0.7 02/15/2023    CT Angio Neck W and/or Wo Contrast  Result Date: 05/09/2021 CLINICAL DATA:  Neck trauma (Age >= 65y); Stroke/TIA, assess extracranial arteries. Left-sided neck pain and swelling for 1 week. EXAM: CT  ANGIOGRAPHY HEAD AND NECK TECHNIQUE: Multidetector CT imaging of the head and neck was performed using the standard protocol during bolus administration of intravenous contrast. Multiplanar CT image reconstructions and MIPs were obtained to evaluate the vascular anatomy. Carotid stenosis measurements (when applicable) are obtained utilizing NASCET criteria, using the distal internal carotid diameter as the denominator. CONTRAST:  80mL OMNIPAQUE IOHEXOL 350 MG/ML SOLN COMPARISON:  Head CT 10/07/2016 FINDINGS: CT HEAD FINDINGS Brain: There is no evidence of an acute infarct, intracranial hemorrhage, mass, midline shift, or extra-axial fluid collection. There is mild cerebral atrophy. A cavum septum pellucidum et vergae is noted, a normal variant. Vascular: Calcified atherosclerosis at the skull base. No hyperdense vessel. Skull: No fracture or suspicious osseous lesion. Sinuses: Mild left maxillary sinus mucosal thickening. Clear mastoid air cells. Orbits: Unremarkable. Review of the MIP images confirms the above findings CTA NECK FINDINGS Aortic arch: Normal variant aortic arch branching pattern with common origin of the brachiocephalic and left common carotid arteries. Mild atherosclerotic plaque without arch vessel origin stenosis. Right carotid system: Patent with minimal calcified plaque at the carotid bifurcation. No evidence of dissection or stenosis. Left carotid system: Patent without evidence of dissection, stenosis, or significant atherosclerosis. Vertebral arteries: The vertebral arteries are patent and codominant without a definite significant stenosis or dissection although assessment of the right V1 segment is mildly limited by artifact from venous contrast. Skeleton: Moderate cervical disc degeneration. Other neck: No evidence of cervical lymphadenopathy or mass. Upper chest: No apical lung consolidation or mass. Review of the MIP images confirms the above findings CTA HEAD FINDINGS Anterior  circulation: The internal carotid arteries are patent from skull base to carotid termini with  mild atherosclerotic plaque in the left carotid siphon not resulting in a significant stenosis. ACAs and MCAs are patent without evidence of a proximal branch occlusion or significant proximal stenosis. No aneurysm is identified. Posterior circulation: The intracranial vertebral arteries are widely patent to the basilar. The basilar artery is patent and congenitally small without evidence of a significant focal stenosis. There is a fetal origin of the left PCA. Both PCAs are patent without evidence of a significant proximal stenosis. No aneurysm is identified. Venous sinuses: As permitted by contrast timing, patent. Anatomic variants: Fetal left PCA. Review of the MIP images confirms the above findings IMPRESSION: 1. No evidence of acute intracranial abnormality. 2. Mild atherosclerosis in the head and neck without large vessel occlusion, significant stenosis, or aneurysm. Electronically Signed   By: Sebastian Ache M.D.   On: 05/09/2021 19:17   CT Angio Head W or Wo Contrast  Result Date: 05/09/2021 CLINICAL DATA:  Neck trauma (Age >= 65y); Stroke/TIA, assess extracranial arteries. Left-sided neck pain and swelling for 1 week. EXAM: CT ANGIOGRAPHY HEAD AND NECK TECHNIQUE: Multidetector CT imaging of the head and neck was performed using the standard protocol during bolus administration of intravenous contrast. Multiplanar CT image reconstructions and MIPs were obtained to evaluate the vascular anatomy. Carotid stenosis measurements (when applicable) are obtained utilizing NASCET criteria, using the distal internal carotid diameter as the denominator. CONTRAST:  80mL OMNIPAQUE IOHEXOL 350 MG/ML SOLN COMPARISON:  Head CT 10/07/2016 FINDINGS: CT HEAD FINDINGS Brain: There is no evidence of an acute infarct, intracranial hemorrhage, mass, midline shift, or extra-axial fluid collection. There is mild cerebral atrophy. A cavum  septum pellucidum et vergae is noted, a normal variant. Vascular: Calcified atherosclerosis at the skull base. No hyperdense vessel. Skull: No fracture or suspicious osseous lesion. Sinuses: Mild left maxillary sinus mucosal thickening. Clear mastoid air cells. Orbits: Unremarkable. Review of the MIP images confirms the above findings CTA NECK FINDINGS Aortic arch: Normal variant aortic arch branching pattern with common origin of the brachiocephalic and left common carotid arteries. Mild atherosclerotic plaque without arch vessel origin stenosis. Right carotid system: Patent with minimal calcified plaque at the carotid bifurcation. No evidence of dissection or stenosis. Left carotid system: Patent without evidence of dissection, stenosis, or significant atherosclerosis. Vertebral arteries: The vertebral arteries are patent and codominant without a definite significant stenosis or dissection although assessment of the right V1 segment is mildly limited by artifact from venous contrast. Skeleton: Moderate cervical disc degeneration. Other neck: No evidence of cervical lymphadenopathy or mass. Upper chest: No apical lung consolidation or mass. Review of the MIP images confirms the above findings CTA HEAD FINDINGS Anterior circulation: The internal carotid arteries are patent from skull base to carotid termini with mild atherosclerotic plaque in the left carotid siphon not resulting in a significant stenosis. ACAs and MCAs are patent without evidence of a proximal branch occlusion or significant proximal stenosis. No aneurysm is identified. Posterior circulation: The intracranial vertebral arteries are widely patent to the basilar. The basilar artery is patent and congenitally small without evidence of a significant focal stenosis. There is a fetal origin of the left PCA. Both PCAs are patent without evidence of a significant proximal stenosis. No aneurysm is identified. Venous sinuses: As permitted by contrast timing,  patent. Anatomic variants: Fetal left PCA. Review of the MIP images confirms the above findings IMPRESSION: 1. No evidence of acute intracranial abnormality. 2. Mild atherosclerosis in the head and neck without large vessel occlusion, significant stenosis, or aneurysm.  Electronically Signed   By: Sebastian Ache M.D.   On: 05/09/2021 19:17   DG Cervical Spine Complete  Result Date: 07/28/2023 CLINICAL DATA:  Acute pain, no trauma EXAM: CERVICAL SPINE - COMPLETE 5 VIEW COMPARISON:  None Available. FINDINGS: The cervical spine is visualized from the skull base through to the superior aspect of T1. There is no evidence of cervical spine fracture or prevertebral soft tissue swelling. Alignment is normal. Degenerative disc height loss, most prominently at C3-C4, where there is also advanced facet and uncovertebral hypertrophy. IMPRESSION: Degenerative disc height loss, most prominently at C3-C4, where there is also advanced facet and uncovertebral hypertrophy. Electronically Signed   By: Wiliam Ke M.D.   On: 07/28/2023 12:51     Assessment & Plan:   Essential hypertension- His blood pressure is well-controlled.  EKG is negative for LVH. -     EKG 12-Lead -     CBC with Differential/Platelet; Future  Neck pain, bilateral- He is neurologically intact.  Labs are reassuring.  Will treat for DDD. -     CBC with Differential/Platelet; Future -     C-reactive protein; Future -     DG Cervical Spine Complete; Future -     traMADol HCl ER; Take 1 tablet (100 mg total) by mouth daily.  Dispense: 90 tablet; Refill: 0  DDD (degenerative disc disease), cervical -     traMADol HCl ER; Take 1 tablet (100 mg total) by mouth daily.  Dispense: 90 tablet; Refill: 0     Follow-up: No follow-ups on file.  Sanda Linger, MD

## 2023-07-28 NOTE — Progress Notes (Signed)
Pls let him know that the xrays show arthritis - I have prescribed a pain medication.  The labs were normal.  Sanda Linger, MD

## 2023-08-01 DIAGNOSIS — R3915 Urgency of urination: Secondary | ICD-10-CM | POA: Diagnosis not present

## 2023-08-15 ENCOUNTER — Ambulatory Visit: Payer: 59 | Admitting: Internal Medicine

## 2023-08-15 ENCOUNTER — Ambulatory Visit (INDEPENDENT_AMBULATORY_CARE_PROVIDER_SITE_OTHER): Payer: 59 | Admitting: Podiatry

## 2023-08-15 DIAGNOSIS — E1151 Type 2 diabetes mellitus with diabetic peripheral angiopathy without gangrene: Secondary | ICD-10-CM

## 2023-08-15 DIAGNOSIS — B351 Tinea unguium: Secondary | ICD-10-CM

## 2023-08-15 NOTE — Progress Notes (Signed)
This patient returns to my office for at risk foot care.  This patient requires this care by a professional since this patient will be at risk due to having diabetes type 2 and coagulation defect.  Patient is taking xarelto.  This patient is unable to cut nails himself since the patient cannot reach his nails.These nails are painful walking and wearing shoes.  This patient presents for at risk foot care today.  General Appearance  Alert, conversant and in no acute stress.  Vascular  Dorsalis pedis and posterior tibial  pulses are weakly palpable  bilaterally.  Capillary return is within normal limits  bilaterally. Temperature is within normal limits  bilaterally.  Neurologic  Senn-Weinstein monofilament wire test within normal limits  bilaterally. Muscle power within normal limits bilaterally.  Nails Thick disfigured discolored nails with subungual debris  from hallux to fifth toes bilaterally.  Orthopedic  No limitations of motion  feet .  No crepitus or effusions noted.  HAV with overlapping second digit  B/L.  Skin  normotropic skin with no porokeratosis noted bilaterally.  No signs of infections or ulcers noted.     Onychomycosis  Pain in right toes  Pain in left toes  Consent was obtained for treatment procedures.   Mechanical debridement of nails 1-5  bilaterally performed with a nail nipper.  Filed with dremel without incident.     Return office visit     3 months                Told patient to return for periodic foot care and evaluation due to potential at risk complications.   Gardiner Barefoot DPM

## 2023-08-18 ENCOUNTER — Ambulatory Visit: Payer: 59 | Admitting: Podiatry

## 2023-08-25 DIAGNOSIS — H2513 Age-related nuclear cataract, bilateral: Secondary | ICD-10-CM | POA: Diagnosis not present

## 2023-08-25 DIAGNOSIS — Z794 Long term (current) use of insulin: Secondary | ICD-10-CM | POA: Diagnosis not present

## 2023-08-25 DIAGNOSIS — E119 Type 2 diabetes mellitus without complications: Secondary | ICD-10-CM | POA: Diagnosis not present

## 2023-08-25 DIAGNOSIS — H40013 Open angle with borderline findings, low risk, bilateral: Secondary | ICD-10-CM | POA: Diagnosis not present

## 2023-08-25 DIAGNOSIS — H1711 Central corneal opacity, right eye: Secondary | ICD-10-CM | POA: Diagnosis not present

## 2023-09-30 ENCOUNTER — Ambulatory Visit: Payer: Self-pay | Admitting: Internal Medicine

## 2023-09-30 NOTE — Telephone Encounter (Signed)
Copied from CRM [id number]. Topic: Clinical - Red Word Triage >> Sep 30, 2023  1:20 PM Gurney Maxin H wrote: Red Word that prompted transfer to Nurse Triage: Patients brother states that the patient fell on Christmas morning, patient is in pain and sore and having difficulty walking has to use a cane.    Chief Complaint: Slip and fall Symptoms: low back pain Frequency: n/a Pertinent Negatives: Patient denies head injury Disposition: [] ED /[] Urgent Care (no appt availability in office) / [x] Appointment(In office/virtual)/ []  [facility]/ [] Home Care/ [] Refused Recommended Disposition /[] [place] [facility]/ []  Follow-up with PCP Additional Notes: Spoke with patient's brother, Ethelene Browns. He reports that patient had a slip and fall 2 days ago. Sts that patient landed on his back and has been having to use a cane. No available appts at [facility] until 12/31. Appt scheduled with Dr. Lenord Fellers on 12/30.   Reason for Disposition  MILD weakness (i.e., does not interfere with ability to work, go to school, normal activities)  (Exception: Mild weakness is a chronic symptom.)  Answer Assessment - Initial Assessment Questions 1. MECHANISM: "How did the fall happen?"     Unwitnessed fall, but brother sts that he may have slipped on tile floor  2. DOMESTIC VIOLENCE AND ELDER ABUSE SCREENING: "Did you fall because someone pushed you or tried to hurt you?" If Yes, ask: "Are you safe now?"     N/a  3. ONSET: "When did the fall happen?" (e.g., minutes, hours, or days ago)     2 days ago  4. LOCATION: "What part of the body hit the ground?" (e.g., back, buttocks, head, hips, knees, hands, head, stomach)     Landed on back  5. INJURY: "Did you hurt (injure) yourself when you fell?" If Yes, ask: "What did you injure? Tell me more about this?" (e.g., body area; type of injury; pain severity)"     Lower back pain  6. PAIN: "Is there any pain?" If Yes, ask: "How bad is the pain?" (e.g., Scale 1-10; or  mild,  moderate, severe)   - NONE (0): No pain   - MILD (1-3): Doesn't interfere with normal activities    - MODERATE (4-7): Interferes with normal activities or awakens from sleep    - SEVERE (8-10): Excruciating pain, unable to do any normal activities      Moderate pain, has to use Cane  7. SIZE: For cuts, bruises, or swelling, ask: "How large is it?" (e.g., inches or centimeters)      Unsure of any cuts or bruises  8. PREGNANCY: "Is there any chance you are pregnant?" "When was your last menstrual period?"     N/a  9. OTHER SYMPTOMS: "Do you have any other symptoms?" (e.g., dizziness, fever, weakness; new onset or worsening).      No other symptoms reported  10. CAUSE: "What do you think caused the fall (or falling)?" (e.g., tripped, dizzy spell)       Slip and fall  Protocols used: Falls and [facility]

## 2023-09-30 NOTE — Telephone Encounter (Signed)
FYI

## 2023-10-03 ENCOUNTER — Ambulatory Visit (INDEPENDENT_AMBULATORY_CARE_PROVIDER_SITE_OTHER): Payer: 59 | Admitting: Internal Medicine

## 2023-10-03 ENCOUNTER — Other Ambulatory Visit: Payer: Self-pay | Admitting: Internal Medicine

## 2023-10-03 ENCOUNTER — Ambulatory Visit
Admission: RE | Admit: 2023-10-03 | Discharge: 2023-10-03 | Disposition: A | Discharge status: Home / Self Care | Payer: 59 | Source: Ambulatory Visit | Attending: Internal Medicine | Admitting: Internal Medicine

## 2023-10-03 ENCOUNTER — Encounter: Payer: Self-pay | Admitting: Internal Medicine

## 2023-10-03 VITALS — BP 142/90 | HR 87 | Ht 68.5 in | Wt 171.0 lb

## 2023-10-03 DIAGNOSIS — E119 Type 2 diabetes mellitus without complications: Secondary | ICD-10-CM | POA: Diagnosis not present

## 2023-10-03 DIAGNOSIS — M545 Low back pain, unspecified: Secondary | ICD-10-CM | POA: Diagnosis not present

## 2023-10-03 DIAGNOSIS — M5416 Radiculopathy, lumbar region: Secondary | ICD-10-CM

## 2023-10-03 DIAGNOSIS — Z794 Long term (current) use of insulin: Secondary | ICD-10-CM | POA: Diagnosis not present

## 2023-10-03 DIAGNOSIS — W19XXXA Unspecified fall, initial encounter: Secondary | ICD-10-CM

## 2023-10-03 MED ORDER — HYDROCODONE-ACETAMINOPHEN 10-325 MG PO TABS: ORAL_TABLET | ORAL | 0 refills | Status: DC

## 2023-10-03 NOTE — Patient Instructions (Addendum)
We are sorry you are not feeling well. Lumbar Xray shows no fracture of vertebrae. Please take Norco 10/325 one half to one tab sparingly every 8 hours as needed for pain. Call in a few days if not improving.

## 2023-10-03 NOTE — Progress Notes (Signed)
Patient Care Team: Etta Grandchild, MD as PCP - General (Internal Medicine) Charlott Rakes, MD as Consulting Physician (Gastroenterology) Carlus Pavlov, MD as Consulting Physician (Internal Medicine) Helane Gunther, DPM as Consulting Physician (Podiatry) Sallye Lat, MD as Consulting Physician (Ophthalmology) Ellin Saba, [facility] (Inactive) (Pharmacist)  Visit Date: 10/03/23  Subjective:    Patient ID: Charles Parker , Male   DOB: [birth year], 77 y.o.    MRN: [id number]  Seen today for acute visit ambulating with rolling walker. 77 y.o. Male presents today for lower back pain following a fall at home on Christmas morning. History of DM type 2, afib, essential HTN, thoracic osteomyelitis, thoracic discitis, thoracic spine fracture, and thoracic fusion.   Reports that 12/25 he slipped on his floor and fell onto his back. Endorses soreness of his lower back radiating to his legs. Hasn't tried heat/ice to relieve his pain. Is able to ambulate, drive, and sleep. He has a history of thoracic osteomyelitis, thoracic discitis, thoracic spine fracture, and thoracic fusion. Primary Care physician is Dr. Marcello Moores.  From his Lumbar Spine Xray today: Posterior thoracic fusion hardware partially visualized. No acute fracture or dislocation. No spondylolisthesis. Lumbar vertebral body and disc height maintained throughout. Small anterior endplate spurs at several lumbar levels. Bilateral facet DJD L4-5. Aortic Atherosclerosis.   History of DM, type 2 followed by  Dr. [place] Thapa with Endocrinology who he last saw 10/08. Last A1c was 9.5%, which was improved from 10.8%. Currently managed with Humalog mix 36 units morning and night, Amaryl 4 mg daily and Farxiga 10 mg daily. Has tried Metformin, Januvia and Amaryl; was also on Invokana for about a year in 2015. Reports that he checks his sugars twice daily with an average of 130 and the recent highest being 275. Denied numbness in his feet.    In 2018 he underwent T8-T9 decompression,disectomy, posteriorlateral arthrodesis T6-T11 posterior fixation T6-T11 segmental pedicle screws T6,7,10,11. He had E.coli osteomyelitis. Had surgery by Dr. Franky Macho and was followed by Dr. Luciana Axe, Infectious Disease Consultant.    Past Medical History:  Diagnosis Date   Clotting disorder (HCC)    DVT   Diabetes mellitus    type II   Diverticulosis    DVT (deep venous thrombosis) (HCC)    Glaucoma    no eye drops    Hepatic cyst    Hypercholesterolemia    Hypertension    Renal cyst    Sleep apnea    Tubulovillous adenoma    Urinary incontinence      Family History  Problem Relation Age of Onset   Hypertension Mother    Diabetes Mother    Cancer Mother        ? location   Colon cancer Father 40   Colon polyps Neg Hx    Liver cancer Neg Hx     Social History   Social History Narrative   Patient lives at home alone.. Patient is retired. Patient has high school education.   Right handed.- Both   Caffeine- Coffee and soda   Son accompanies him today.   Review of Systems  Constitutional:  Negative for fever and malaise/fatigue.  HENT:  Negative for congestion.   Eyes:  Negative for blurred vision.  Respiratory:  Negative for cough and shortness of breath.   Cardiovascular:  Negative for chest pain, palpitations and leg swelling.  Gastrointestinal:  Negative for vomiting.  Musculoskeletal:  Positive for back pain (lower) and falls (on 12/25).  Skin:  Negative  for rash.  Neurological:  Negative for loss of consciousness and headaches.        Objective:   Vitals: BP (!) 142/90   Pulse 87   Ht 5' 8.5" (1.74 m)   Wt 171 lb (77.6 kg)   SpO2 98%   BMI 25.62 kg/m    Physical Exam Vitals and nursing note reviewed.  Constitutional:      General: He is awake. He is not in acute distress.    Appearance: Normal appearance. He is not ill-appearing or toxic-appearing.  HENT:     Head: Normocephalic and atraumatic.      Right Ear: Tympanic membrane, ear canal and external ear normal.     Left Ear: Tympanic membrane, ear canal and external ear normal.     Mouth/Throat:     Pharynx: Oropharynx is clear.  Eyes:     Extraocular Movements: Extraocular movements intact.     Pupils: Pupils are equal, round, and reactive to light.  Neck:     Thyroid: No thyroid mass, thyromegaly or thyroid tenderness.     Vascular: No carotid bruit.  Cardiovascular:     Rate and Rhythm: Normal rate and regular rhythm. No extrasystoles are present.    Pulses:          Dorsalis pedis pulses are 1+ on the right side and 1+ on the left side.     Heart sounds: Normal heart sounds. No murmur heard.    No friction rub. No gallop.  Pulmonary:     Effort: Pulmonary effort is normal.     Breath sounds: Normal breath sounds. No decreased breath sounds, wheezing, rhonchi or rales.  Chest:     Chest wall: No mass.  Abdominal:     Palpations: Abdomen is soft. There is no hepatomegaly, splenomegaly or mass.     Tenderness: There is no abdominal tenderness.     Hernia: No hernia is present.  Musculoskeletal:     Cervical back: Normal range of motion.     Right lower leg: No edema.     Left lower leg: No edema.     Comments: Right para-lumbar tenderness   Lymphadenopathy:     Cervical: No cervical adenopathy.     Upper Body:     Right upper body: No supraclavicular adenopathy.     Left upper body: No supraclavicular adenopathy.  Skin:    General: Skin is warm and dry.  Neurological:     General: No focal deficit present.     Mental Status: He is alert and oriented to person, place, and time. Mental status is at baseline.     Cranial Nerves: Cranial nerves 2-12 are intact.     Sensory: Sensation is intact.     Motor: Motor function is intact.     Coordination: Coordination is intact.     Gait: Gait is intact.     Deep Tendon Reflexes: Reflexes are normal and symmetric.  Psychiatric:        Attention and Perception: Attention  normal.        Mood and Affect: Mood normal.        Speech: Speech normal.        Behavior: Behavior normal. Behavior is cooperative.        Thought Content: Thought content normal.        Cognition and Memory: Cognition and memory normal.        Judgment: Judgment normal.       Results:   Studies  obtained and personally reviewed by me:    Labs:       Component Value Date/Time   NA 142 04/25/2023 1026   NA 146 11/22/2016 0000   K 3.7 04/25/2023 1026   CL 107 04/25/2023 1026   CO2 24 04/25/2023 1026   GLUCOSE 155 (H) 04/25/2023 1026   BUN 13 04/25/2023 1026   BUN 13 11/22/2016 0000   CREATININE 0.97 04/25/2023 1026   CREATININE 1.04 03/24/2017 1049   CALCIUM 9.2 04/25/2023 1026   PROT 7.5 02/15/2023 0925   ALBUMIN 4.4 02/15/2023 0925   AST 18 02/15/2023 0925   ALT 23 02/15/2023 0925   ALKPHOS 66 02/15/2023 0925   BILITOT 0.6 02/15/2023 0925   GFRNONAA >60 05/09/2021 1341   GFRNONAA 87 12/23/2016 0922   GFRAA >89 12/23/2016 0922     Lab Results  Component Value Date   WBC 6.4 07/28/2023   HGB 13.7 07/28/2023   HCT 42.7 07/28/2023   MCV 92.5 07/28/2023   PLT 322.0 07/28/2023    Lab Results  Component Value Date   CHOL 199 02/15/2023   HDL 46.20 02/15/2023   LDLCALC 122 (H) 02/15/2023   LDLDIRECT 104.0 04/25/2023   TRIG 154.0 (H) 02/15/2023   CHOLHDL 4 02/15/2023    Lab Results  Component Value Date   HGBA1C 9.5 (A) 05/31/2023     Lab Results  Component Value Date   TSH 1.45 09/21/2022     Lab Results  Component Value Date   PSA 2.22 09/21/2022   PSA 1.82 11/07/2020   PSA 2.70 09/24/2019   Assessment & Plan:   Encounter for a Fall with subsequent Lower Back Pain: Sending in 10-325 mg hydrocodone-acetaminophen #15 tabs.Take 1 every 8 hours with food, as needed for pain. Xray shows no acute fractures. Called patient to inform him of the results of his lumbar Xray. No acute fracture or dislocation.Disc height maintained throughout.  Diabetes  Mellitus, type II: followed by Dr.[place] Thapa with Endocrinology whom he last saw 10/08. Last A1c was 9.5%, which was improved from 10.8%. Currently managed with Humalog mix 36 units morning and night, Amaryl 4 mg daily and Farxiga 10 mg daily. Denied numbness in his feet. Record states he has been on insulin since about 2016.  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   I, Margaree Mackintosh, MD, have reviewed all documentation for this visit. The documentation on 10/03/23 for the exam, diagnosis, procedures, and orders are all accurate and complete.

## 2023-10-12 ENCOUNTER — Encounter: Payer: Self-pay | Admitting: Endocrinology

## 2023-10-12 ENCOUNTER — Ambulatory Visit: Payer: 59 | Admitting: Endocrinology

## 2023-10-12 VITALS — BP 118/70 | HR 50 | Resp 20 | Ht 68.5 in | Wt 167.4 lb

## 2023-10-12 DIAGNOSIS — E1142 Type 2 diabetes mellitus with diabetic polyneuropathy: Secondary | ICD-10-CM

## 2023-10-12 DIAGNOSIS — Z7984 Long term (current) use of oral hypoglycemic drugs: Secondary | ICD-10-CM

## 2023-10-12 DIAGNOSIS — Z794 Long term (current) use of insulin: Secondary | ICD-10-CM

## 2023-10-12 DIAGNOSIS — E118 Type 2 diabetes mellitus with unspecified complications: Secondary | ICD-10-CM

## 2023-10-12 DIAGNOSIS — E1165 Type 2 diabetes mellitus with hyperglycemia: Secondary | ICD-10-CM

## 2023-10-12 DIAGNOSIS — E119 Type 2 diabetes mellitus without complications: Secondary | ICD-10-CM

## 2023-10-12 LAB — POCT GLYCOSYLATED HEMOGLOBIN (HGB A1C): Hemoglobin A1C: 11.3 % — AB (ref 4.0–5.6)

## 2023-10-12 MED ORDER — GLIMEPIRIDE 4 MG PO TABS: 4.0000 mg | ORAL_TABLET | Freq: Every day | ORAL | 3 refills | Status: AC

## 2023-10-12 MED ORDER — DAPAGLIFLOZIN PROPANEDIOL 10 MG PO TABS: 10.0000 mg | ORAL_TABLET | Freq: Every day | ORAL | 3 refills | Status: DC

## 2023-10-12 MED ORDER — INSULIN LISPRO PROT & LISPRO (75-25 MIX) 100 UNIT/ML KWIKPEN: 36.0000 [IU] | PEN_INJECTOR | Freq: Two times a day (BID) | SUBCUTANEOUS | 1 refills | Status: DC

## 2023-10-12 MED ORDER — FREESTYLE LIBRE 3 PLUS SENSOR MISC: 1.0000 | 3 refills | Status: DC

## 2023-10-12 MED ORDER — FREESTYLE LIBRE 3 READER DEVI: 0 refills | Status: AC

## 2023-10-12 NOTE — Patient Instructions (Addendum)
 Increase Novolog Mix 36 units two times a day with breakfast and dinner.  Glimepiride and farxiga same dose.    Sent prescription for monitor, refill from pharmacy, then call our clinic to start with diabetes edicator nurse.

## 2023-10-12 NOTE — Progress Notes (Signed)
 Outpatient Endocrinology Note Zi Sek, MD  10/12/23  Patient's Name: Charles Parker    DOB: [birth year]    MRN: [id number]                                                    REASON OF VISIT: Follow up for type 2 diabetes mellitus  PCP: Joshua Debby CROME, MD  HISTORY OF PRESENT ILLNESS:   Charles Parker is a 78 y.o. old male with past medical history listed below, is here for follow up of type 2 diabetes mellitus.   Pertinent Diabetes History: Patient was diagnosed with type 2 diabetes mellitus in 2014.  He was on various antidiabetic medications since onset including metformin, Januvia  and Amaryl .  He was also on Invokana  for about a year in 2015.  His diabetes control has been variable with highest hemoglobin A1c was 13.6% in 2016.  He has mostly hemoglobin A1c in the range of 8.4 to 10.8%.  Chronic Diabetes Complications : Retinopathy: no. Last ophthalmology exam was done ? on annually, due.  Nephropathy: no, on losartan . Peripheral neuropathy: yes, tingling present, gabapentin .  Coronary artery disease: no Stroke: no  Relevant comorbidities and cardiovascular risk factors: Obesity: no Body mass index is 25.08 kg/m.  Hypertension: yes Hyperlipidemia. Yes, on a statin.  Current / Home Diabetic regimen includes: Farxiga  10 mg daily. NovoLog  mix 75/25 : 30 units in the morning and 30 units in the evening. Glimepiride  4mg  daily.   Prior diabetic medications: Metformin : Stopped due to hives /allergic reaction.  Glycemic data:   Not able to download and review the glucometer data and also not able to review glucose directly from the meter due to battery issues.  He reports blood sugar has been 130-135 range and sometimes more than 200.  Rarely blood sugar less than 100 few weeks ago he had blood sugar 97 one time.  He had tried Dexcom CGM in the past and had sensor falling issues.  Hypoglycemia: Patient has no hypoglycemic episodes. Patient has hypoglycemia  awareness.  Factors modifying glucose control: 1.  Diabetic diet assessment: 3 meals a day.  2.  Staying active or exercising: No formal exercise.  3.  Medication compliance: compliant most of the time.  Interval history Hemoglobin A1c 11.3% today worsening.  He has been taking NovoLog  mix 30 units 2 times a day, he was taking 40 units 2 times daily and last visit, patient not sure when the dose was decreased.  He reports compliance with insulin .  Diet has not been great due to holiday season.  He reports compliance with Farxiga  and glimepiride .  No other complaints today.  REVIEW OF SYSTEMS As per history of present illness.   PAST MEDICAL HISTORY: Past Medical History:  Diagnosis Date   Clotting disorder (HCC)    DVT   Diabetes mellitus    type II   Diverticulosis    DVT (deep venous thrombosis) (HCC)    Glaucoma    no eye drops    Hepatic cyst    Hypercholesterolemia    Hypertension    Renal cyst    Sleep apnea    Tubulovillous adenoma    Urinary incontinence     PAST SURGICAL HISTORY: Past Surgical History:  Procedure Laterality Date   BLADDER NECK RECONSTRUCTION  02/03/2012   Procedure: BLADDER NECK  REPAIR;  Surgeon: Elspeth KYM Schultze, MD;  Location: [facility];  Service: General;;   COLONOSCOPY  02/2013   polyp removal(mult.)   KNEE SURGERY  2004   left   POLYPECTOMY     PROCTOSCOPY  02/03/2012   Procedure: PROCTOSCOPY;  Surgeon: Elspeth KYM Schultze, MD;  Location: [facility];  Service: General;;   STOMACH SURGERY  02/2012   TEE WITHOUT CARDIOVERSION N/A 10/05/2016   Procedure: TRANSESOPHAGEAL ECHOCARDIOGRAM (TEE);  Surgeon: Jerel Balding, MD;  Location: [facility];  Service: Cardiovascular;  Laterality: N/A;    ALLERGIES: Allergies  Allergen Reactions   Metformin And Related Diarrhea   Codeine Rash    All over the body    FAMILY HISTORY:  Family History  Problem Relation Age of Onset   Hypertension Mother    Diabetes Mother    Cancer Mother        ? location    Colon cancer Father 77   Colon polyps Neg Hx    Liver cancer Neg Hx     SOCIAL HISTORY: Social History   Socioeconomic History   Marital status: Widowed    Spouse name: Not on file   Number of children: 1   Years of education: 65   Highest education level: Never attended school  Occupational History   Occupation: retired    Comment: coal [employer]  Tobacco Use   Smoking status: Never   Smokeless tobacco: Never  Vaping Use   Vaping status: Never Used  Substance and Sexual Activity   Alcohol  use: No    Alcohol /week: 0.0 standard drinks of alcohol     Comment: once every two months   Drug use: No   Sexual activity: Not Currently  Other Topics Concern   Not on file  Social History Narrative   Patient lives at home alone.. Patient is retired. Patient has high school education.   Right handed.- Both   Caffeine- Coffee and soda   Social Drivers of Health   Financial Resource Strain: Medium Risk (09/30/2023)   Overall Financial Resource Strain (CARDIA)    Difficulty of Paying Living Expenses: Somewhat hard  Food Insecurity: Food Insecurity Present (09/30/2023)   Hunger Vital Sign    Worried About Running Out of Food in the Last Year: Sometimes true    Ran Out of Food in the Last Year: Sometimes true  Transportation Needs: No Transportation Needs (09/30/2023)   PRAPARE - Administrator, Civil Service (Medical): No    Lack of Transportation (Non-Medical): No  Physical Activity: Insufficiently Active (09/30/2023)   Exercise Vital Sign    Days of Exercise per Week: 2 days    Minutes of Exercise per Session: 20 min  Stress: No Stress Concern Present (09/30/2023)   [employer] of Occupational Health - Occupational Stress Questionnaire    Feeling of Stress : Only a little  Social Connections: Moderately Integrated (09/30/2023)   Social Connection and Isolation Panel [NHANES]    Frequency of Communication with Friends and Family: Twice a week    Frequency of  Social Gatherings with Friends and Family: Once a week    Attends Religious Services: More than 4 times per year    Active Member of [employer] or Organizations: Yes    Attends Banker Meetings: More than 4 times per year    Marital Status: Widowed    MEDICATIONS:  Current Outpatient Medications  Medication Sig Dispense Refill   Alcohol  Swabs  PADS Use to clean area before insulin  injections. DX  E11.8 200 each 3   blood glucose meter kit and supplies KIT 1 each by Does not apply route daily as needed. Accu-Chek Aviva preferred by patient. Use up to four times daily as directed. (FOR ICD-9 250.00, 250.01).     Cholecalciferol  50 MCG (2000 UT) TABS Take 1 tablet (2,000 Units total) by mouth daily. 90 tablet 3   Continuous Blood Gluc Receiver (FREESTYLE LIBRE 2 READER) DEVI 1 Act by Does not apply route daily. 2 each 5   Continuous Blood Gluc Sensor (DEXCOM G7 SENSOR) MISC Change every 10 days 3 each 3   Continuous Blood Gluc Sensor (FREESTYLE LIBRE 2 SENSOR) MISC 1 Act by Does not apply route daily. 2 each 5   Continuous Glucose Receiver (FREESTYLE LIBRE 3 READER) DEVI Use with free stype libre 3+ 1 each 0   Continuous Glucose Sensor (FREESTYLE LIBRE 3 PLUS SENSOR) MISC 1 each by Does not apply route continuous. Change every 15 days. 6 each 3   ezetimibe  (ZETIA ) 10 MG tablet Take 1 tablet (10 mg total) by mouth daily. 90 tablet 3   gabapentin  (NEURONTIN ) 300 MG capsule TAKE 1 CAPSULE(300 MG) BY MOUTH TWICE DAILY (Patient taking differently: Take 300 mg by mouth daily. TAKE 1 CAPSULE(300 MG) BY MOUTH TWICE DAILY) 180 capsule 1   Glucagon  (GVOKE HYPOPEN  2-PACK) 1 MG/0.2ML SOAJ Inject 1 Act into the skin daily as needed. 2 mL 5   glucose blood test strip Use Accu Chek Aviva Test Strips as instructed to check blood sugar three times daily. 100 each 3   HYDROcodone -acetaminophen  (NORCO) 10-325 MG tablet One half to one tab every 8 hours as needed for spinal pain. Take with food. 15 tablet 0    Insulin  Pen Needle (B-D UF III MINI PEN NEEDLES) 31G X 5 MM MISC Inject 1 Act into the skin 2 (two) times daily. ADMINISTER 30 UNITS EVERY EVENING 100 each 2   ketoconazole  (NIZORAL ) 2 % cream Apply 1 application. topically 2 (two) times daily. 60 g 3   losartan  (COZAAR ) 25 MG tablet Take 1 tablet (25 mg total) by mouth daily. 90 tablet 1   rivaroxaban  (XARELTO ) 10 MG TABS tablet TAKE 1 TABLET BY MOUTH DAILY 100 tablet 0   simvastatin  (ZOCOR ) 40 MG tablet TAKE 1 TABLET BY MOUTH AT  BEDTIME 100 tablet 0   solifenacin  (VESICARE ) 10 MG tablet Take 1 tablet (10 mg total) by mouth daily. 90 tablet 1   tamsulosin  (FLOMAX ) 0.4 MG CAPS capsule Take 0.4 mg by mouth daily.     traMADol  (ULTRAM -ER) 100 MG 24 hr tablet Take 1 tablet (100 mg total) by mouth daily. 90 tablet 0   dapagliflozin  propanediol (FARXIGA ) 10 MG TABS tablet Take 1 tablet (10 mg total) by mouth daily. 90 tablet 3   glimepiride  (AMARYL ) 4 MG tablet Take 1 tablet (4 mg total) by mouth daily before breakfast. 100 tablet 3   Insulin  Lispro Prot & Lispro (HUMALOG  MIX 75/25 KWIKPEN) (75-25) 100 UNIT/ML Kwikpen Inject 36 Units into the skin in the morning and at bedtime. 66 mL 1   No current facility-administered medications for this visit.    PHYSICAL EXAM: Vitals:   10/12/23 1047  BP: 118/70  Pulse: (!) 50  Resp: 20  SpO2: 90%  Weight: 167 lb 6.4 oz (75.9 kg)  Height: 5' 8.5 (1.74 m)     Body mass index is 25.08 kg/m.  Wt Readings from Last 3 Encounters:  10/12/23 167 lb 6.4 oz (75.9 kg)  10/03/23 171 lb (77.6 kg)  07/28/23 170 lb 12.8 oz (77.5 kg)    General: Well developed, well nourished male in no apparent distress.  HEENT: AT/[place], no external lesions.  Eyes: Conjunctiva clear and no icterus. Neck: Neck supple  Lungs: Respirations not labored Neurologic: Alert, oriented, normal speech Extremities / Skin: Dry.   Psychiatric: Does not appear depressed or anxious  Diabetic Foot Exam - Simple   No data filed     LABS Reviewed Lab Results  Component Value Date   HGBA1C 11.3 (A) 10/12/2023   HGBA1C 9.5 (A) 05/31/2023   HGBA1C 10.8 (H) 02/15/2023   Lab Results  Component Value Date   FRUCTOSAMINE 381 (H) 04/25/2023   FRUCTOSAMINE 431 (H) 12/02/2022   FRUCTOSAMINE 369 (H) 02/23/2022   Lab Results  Component Value Date   CHOL 199 02/15/2023   HDL 46.20 02/15/2023   LDLCALC 122 (H) 02/15/2023   LDLDIRECT 104.0 04/25/2023   TRIG 154.0 (H) 02/15/2023   CHOLHDL 4 02/15/2023   Lab Results  Component Value Date   MICRALBCREAT 1.5 02/15/2023   MICRALBCREAT 0.6 02/23/2022   Lab Results  Component Value Date   CREATININE 0.97 04/25/2023   Lab Results  Component Value Date   GFR 75.49 04/25/2023    ASSESSMENT / PLAN  1. Uncontrolled type 2 diabetes mellitus with hyperglycemia, with long-term current use of insulin  (HCC)   2. Type 2 diabetes mellitus with complication, with long-term current use of insulin  (HCC)   3. Insulin -requiring or dependent type II diabetes mellitus (HCC)       Diabetes Mellitus type 2, complicated by peripheral neuropathy. - Diabetic status / severity: Controlled  Lab Results  Component Value Date   HGBA1C 11.3 (A) 10/12/2023    - Hemoglobin A1c goal : <7.5%  No glucose data to review, not able to download glucometer in the clinic today.  He reportedly has blood sugar in 200 range.  Denies hypoglycemia.  Will empirically adjust and increase the dose of insulin  as follows.  - Medications:  I) Increase NovoLog  Mix 75/25: from 30 to 36 units 2 times a day with breakfast and dinner. II) continue glimepiride  4 mg daily. III) continue Farxiga  10 mg daily.  - Home glucose testing: 2-3 times a day.  Discussed about continuous glucose monitoring.  Patient agreed to retry.  He had tried Dexcom with issues in the past.  Sent prescription for freestyle libre 3+ with reader.  Will have diabetic educator visit to initiate CGM.  - Discussed/ Gave  Hypoglycemia treatment plan.  # Consult : Diabetic educator.  # Annual urine for microalbuminuria/ creatinine ratio, no microalbuminuria currently, continue ACE/ARB /losartan . Last  Lab Results  Component Value Date   MICRALBCREAT 1.5 02/15/2023    # Foot check nightly / neuropathy, continue gabapentin .  # Annual dilated diabetic eye exams.  Advised to have diabetic eye exam.  - Diet: Eat reasonable portion sizes to promote a healthy weight - Life style / activity / exercise: Discussed/as tolerated.  2. Blood pressure  -  BP Readings from Last 1 Encounters:  10/12/23 118/70    - Control is in target.  - No change in current plans.  3. Lipid status / Hyperlipidemia - Last  Lab Results  Component Value Date   LDLCALC 122 (H) 02/15/2023   - Continue simvastatin  40 mg daily.  Zetia  10 mg daily.  Managed by primary care provider.  Diagnoses and all orders for this visit:  Uncontrolled type 2 diabetes mellitus  with hyperglycemia, with long-term current use of insulin  (HCC) -     POCT glycosylated hemoglobin (Hb A1C) -     Amb Referral to Nutrition and Diabetic Education -     dapagliflozin  propanediol (FARXIGA ) 10 MG TABS tablet; Take 1 tablet (10 mg total) by mouth daily. -     Insulin  Lispro Prot & Lispro (HUMALOG  MIX 75/25 KWIKPEN) (75-25) 100 UNIT/ML Kwikpen; Inject 36 Units into the skin in the morning and at bedtime. -     glimepiride  (AMARYL ) 4 MG tablet; Take 1 tablet (4 mg total) by mouth daily before breakfast.  Type 2 diabetes mellitus with complication, with long-term current use of insulin  (HCC) -     dapagliflozin  propanediol (FARXIGA ) 10 MG TABS tablet; Take 1 tablet (10 mg total) by mouth daily. -     glimepiride  (AMARYL ) 4 MG tablet; Take 1 tablet (4 mg total) by mouth daily before breakfast.  Insulin -requiring or dependent type II diabetes mellitus (HCC) -     Insulin  Lispro Prot & Lispro (HUMALOG  MIX 75/25 KWIKPEN) (75-25) 100 UNIT/ML Kwikpen; Inject 36  Units into the skin in the morning and at bedtime.  Other orders -     Continuous Glucose Sensor (FREESTYLE LIBRE 3 PLUS SENSOR) MISC; 1 each by Does not apply route continuous. Change every 15 days. -     Continuous Glucose Receiver (FREESTYLE LIBRE 3 READER) DEVI; Use with free stype libre 3+     DISPOSITION Follow up in clinic in 2 months suggested.   All questions answered and patient verbalized understanding of the plan.  Ylianna Almanzar, MD [facility] Endocrinology [facility] [address] [place], [address] [place], [place] [address] Phone # [phone]  At least part of this note was generated using voice recognition software. Inadvertent word errors may have occurred, which were not recognized during the proofreading process.

## 2023-10-21 ENCOUNTER — Telehealth: Payer: Self-pay

## 2023-10-21 NOTE — Progress Notes (Signed)
Care Guide Pharmacy Note  10/21/2023 Name: Charles Parker MRN: [id number] DOB: Aug 04, 1946  Referred By: Etta Grandchild, MD Reason for referral: Care Coordination ([facility] Diabetes. )   Charles Parker is a 78 y.o. year old male who is a primary care patient of Etta Grandchild, MD.  [name] was referred to the pharmacist for assistance related to: DMII  Successful contact was made with the patient to discuss pharmacy services including being ready for the pharmacist to call at least 5 minutes before the scheduled appointment time and to have medication bottles and any blood pressure readings ready for review. The patient agreed to meet with the pharmacist via telephone visit on (date/time). 11/01/23 at 2:00 p.m.   [facility]  [facility], [facility] Health Care Management Assistant Direct Dial: [phone]  Fax: [phone]

## 2023-10-31 DIAGNOSIS — N138 Other obstructive and reflux uropathy: Secondary | ICD-10-CM | POA: Diagnosis not present

## 2023-11-01 ENCOUNTER — Other Ambulatory Visit (INDEPENDENT_AMBULATORY_CARE_PROVIDER_SITE_OTHER): Payer: 59 | Admitting: Pharmacist

## 2023-11-01 DIAGNOSIS — Z794 Long term (current) use of insulin: Secondary | ICD-10-CM

## 2023-11-01 DIAGNOSIS — E118 Type 2 diabetes mellitus with unspecified complications: Secondary | ICD-10-CM

## 2023-11-01 NOTE — Progress Notes (Signed)
   11/01/2023 Name: Charles Parker MRN: [id number] DOB: [birth year]  Chief Complaint  Patient presents with   [employer] Diabetes    Charles Parker is a 78 y.o. year old male who presented for a telephone visit.   They were referred to the pharmacist by  [employer]  for assistance in managing diabetes.   Subjective:  Care Team: Primary Care Provider: Etta Grandchild, MD ; Next Scheduled Visit: none scheduled Endocrinologist Dr. Erroll Luna; Next Scheduled Visit: 01/10/24  Medication Access/Adherence  Current Pharmacy:  [facility] [phone] - [place], [place] - [address] AT [facility] [address] [place] [address] Phone: [phone] Fax: [phone]  [facility] - [place], [place] - [address] [place] [address] Phone: [phone] Fax: [phone]   Patient reports affordability concerns with their medications: No  Patient reports access/transportation concerns to their pharmacy: No  Patient reports adherence concerns with their medications:  No     Diabetes:  Current medications: Farxiga 10 mg daily, Humalog Mix 75/25 36 units twice daily with meals, glimepiride 4 mg daily Medications tried in the past: Invokana, Lantus, Toujeo, Actos, Januvia  Current glucose readings: BG 125-160s, lowest 102 Using glucometer; testing 2 times daily, before breakfast and dinner  Endo has sent Rx for CGM, however pt states it did not work for him last time he tried  Patient denies hypoglycemic s/sx including dizziness, shakiness, sweating.   Current meal patterns:  - Breakfast: cheerios or eggs or oatmeal - Lunch: bologna sandwich, PB sandwich - Supper: salmon cakes, sausage/egg, cream potatoes, green bean and corn mix - Snacks: crackers or chips occasionally  - Drinks: Coke or [employer] zero, water, OJ (1 gallon per week to 1.5 week)  Current physical  activity: goes on walks occasionally    Objective:  Lab Results  Component Value Date   HGBA1C 11.3 (A) 10/12/2023    Lab Results  Component Value Date   CREATININE 0.97 04/25/2023   BUN 13 04/25/2023   NA 142 04/25/2023   K 3.7 04/25/2023   CL 107 04/25/2023   CO2 24 04/25/2023    Lab Results  Component Value Date   CHOL 199 02/15/2023   HDL 46.20 02/15/2023   LDLCALC 122 (H) 02/15/2023   LDLDIRECT 104.0 04/25/2023   TRIG 154.0 (H) 02/15/2023   CHOLHDL 4 02/15/2023    Medications Reviewed Today   Medications were not reviewed in this encounter     Assessment/Plan:   Diabetes: - Currently uncontrolled, A1c <8% - Reviewed goal A1c, goal fasting, and goal 2 hour post prandial glucose - Reviewed dietary modifications including increasing fiber (veggies/fruits) and protein, balanced meals/snacks, reducing juice - Reviewed lifestyle modifications including: increased walking when able - Recommend to continue current regimen  - Recommend to check glucose at different times of day to see where BG may be running high. Possibly having high BG spikes after meals, driving U9N up.    Follow Up Plan: 2/25  Arbutus Leas, PharmD, BCPS, CPP Clinical Pharmacist Practitioner [facility] [phone]

## 2023-11-01 NOTE — Patient Instructions (Signed)
It was a pleasure speaking with you today!  Focus on increasing fiber at every meal/snack by eating more vegetables and fruits. Increase protein at every meal and snack as well to help avoid blood sugar spikes.  I'll give you a call on 2/25 to see how your blood sugars are doing.  Feel free to call with any questions or concerns!  Arbutus Leas, PharmD, BCPS, CPP Clinical Pharmacist Practitioner [facility] at [facility] [phone]

## 2023-11-08 ENCOUNTER — Other Ambulatory Visit: Payer: Self-pay

## 2023-11-09 ENCOUNTER — Other Ambulatory Visit: Payer: Self-pay | Admitting: Urology

## 2023-11-09 DIAGNOSIS — N401 Enlarged prostate with lower urinary tract symptoms: Secondary | ICD-10-CM

## 2023-11-15 ENCOUNTER — Ambulatory Visit (INDEPENDENT_AMBULATORY_CARE_PROVIDER_SITE_OTHER): Payer: No Typology Code available for payment source | Admitting: Podiatry

## 2023-11-15 ENCOUNTER — Encounter: Payer: Self-pay | Admitting: Podiatry

## 2023-11-15 DIAGNOSIS — E1151 Type 2 diabetes mellitus with diabetic peripheral angiopathy without gangrene: Secondary | ICD-10-CM

## 2023-11-15 DIAGNOSIS — B351 Tinea unguium: Secondary | ICD-10-CM | POA: Diagnosis not present

## 2023-11-15 NOTE — Progress Notes (Signed)
This patient returns to my office for at risk foot care.  This patient requires this care by a professional since this patient will be at risk due to having diabetes type 2 and coagulation defect.  Patient is taking xarelto.  This patient is unable to cut nails himself since the patient cannot reach his nails.These nails are painful walking and wearing shoes.  This patient presents for at risk foot care today.  General Appearance  Alert, conversant and in no acute stress.  Vascular  Dorsalis pedis and posterior tibial  pulses are weakly palpable  bilaterally.  Capillary return is within normal limits  bilaterally. Temperature is within normal limits  bilaterally.  Neurologic  Senn-Weinstein monofilament wire test within normal limits  bilaterally. Muscle power within normal limits bilaterally.  Nails Thick disfigured discolored nails with subungual debris  from hallux to fifth toes bilaterally.  Orthopedic  No limitations of motion  feet .  No crepitus or effusions noted.  HAV with overlapping second digit  B/L.  Skin  normotropic skin with no porokeratosis noted bilaterally.  No signs of infections or ulcers noted.     Onychomycosis  Pain in right toes  Pain in left toes  Consent was obtained for treatment procedures.   Mechanical debridement of nails 1-5  bilaterally performed with a nail nipper.  Filed with dremel without incident.     Return office visit     3 months                Told patient to return for periodic foot care and evaluation due to potential at risk complications.   Helane Gunther DPM

## 2023-11-25 NOTE — Progress Notes (Signed)
 Chief Complaint: Patient was seen in consultation today for benign prostatic hyperplasia with lower urinary tract symptoms.   Referring Physician(s): Herrick,Benjamin W  History of Present Illness: Charles Parker is a 78 y.o. male with a medical history significant for DM, DVT, atrial fibrillation (Xarelto), HTN and thoracic osteomyelitis/discitis/fracture/fusion. He underwent extensive spine surgery in 2018 with Dr. Franky Macho. He also has a longstanding history of BPH with urinary urgency/incontinence. He was scheduled for a TURP approximately 20 years ago but this was cancelled due to poorly controlled diabetes. His lower urinary tract symptoms have progressed over the past year and he is now voiding every 20 minutes. He has difficulty getting to the bathroom in time. He also complains of a weak stream. At his last visit with Urology he reported being intermittently compliant with his medications (Finasteride, Myrbetriq and Tamsulosin) and he expressed a desire to pursue treatment for his BPH. The patient has kindly been referred to Interventional Radiology by Dr. Marlou Porch to discuss prostate artery embolization.   He has never experienced acute urinary retention.  He occasionally sees trace blood in his urine.  No UTIs.      Past Medical History:  Diagnosis Date   Clotting disorder (HCC)    DVT   Diabetes mellitus    type II   Diverticulosis    DVT (deep venous thrombosis) (HCC)    Glaucoma    no eye drops    Hepatic cyst    Hypercholesterolemia    Hypertension    Renal cyst    Sleep apnea    Tubulovillous adenoma    Urinary incontinence     Past Surgical History:  Procedure Laterality Date   BLADDER NECK RECONSTRUCTION  02/03/2012   Procedure: BLADDER NECK REPAIR;  Surgeon: Ardeth Sportsman, MD;  Location: [facility] ORS;  Service: General;;   COLONOSCOPY  02/2013   polyp removal(mult.)   KNEE SURGERY  2004   left   POLYPECTOMY     PROCTOSCOPY  02/03/2012   Procedure: PROCTOSCOPY;   Surgeon: Ardeth Sportsman, MD;  Location: [facility];  Service: General;;   STOMACH SURGERY  02/2012   TEE WITHOUT CARDIOVERSION N/A 10/05/2016   Procedure: TRANSESOPHAGEAL ECHOCARDIOGRAM (TEE);  Surgeon: Thurmon Fair, MD;  Location: [facility] ENDOSCOPY;  Service: Cardiovascular;  Laterality: N/A;    Allergies: Metformin and related and Codeine  Medications: Prior to Admission medications   Medication Sig Start Date End Date Taking? Authorizing Provider  Alcohol Swabs PADS Use to clean area before insulin injections. DX E11.8 10/21/20   Reather Littler, MD  blood glucose meter kit and supplies KIT 1 each by Does not apply route daily as needed. Accu-Chek Aviva preferred by patient. Use up to four times daily as directed. (FOR ICD-9 250.00, 250.01).    [provider]  Cholecalciferol 50 MCG (2000 UT) TABS Take 1 tablet (2,000 Units total) by mouth daily. 11/08/20   Etta Grandchild, MD  Continuous Blood Gluc Receiver (FREESTYLE LIBRE 2 READER) DEVI 1 Act by Does not apply route daily. 02/06/21   Etta Grandchild, MD  Continuous Blood Gluc Sensor (FREESTYLE LIBRE 2 SENSOR) MISC 1 Act by Does not apply route daily. 02/06/21   Etta Grandchild, MD  Continuous Glucose Receiver (FREESTYLE LIBRE 3 READER) DEVI Use with free stype libre 3+ 10/12/23   Thapa, [place], MD  Continuous Glucose Sensor (FREESTYLE LIBRE 3 PLUS SENSOR) MISC 1 each by Does not apply route continuous. Change every 15 days. 10/12/23   Thapa,  [place], MD  dapagliflozin propanediol (FARXIGA) 10 MG TABS tablet Take 1 tablet (10 mg total) by mouth daily. 10/12/23   Thapa, [place], MD  ezetimibe (ZETIA) 10 MG tablet Take 1 tablet (10 mg total) by mouth daily. 02/22/23   Reather Littler, MD  gabapentin (NEURONTIN) 300 MG capsule TAKE 1 CAPSULE(300 MG) BY MOUTH TWICE DAILY Patient taking differently: Take 300 mg by mouth daily. TAKE 1 CAPSULE(300 MG) BY MOUTH TWICE DAILY 10/21/20   Reather Littler, MD  glimepiride (AMARYL) 4 MG tablet Take 1 tablet (4 mg total) by mouth  daily before breakfast. 10/12/23   Thapa, [place], MD  Glucagon (GVOKE HYPOPEN 2-PACK) 1 MG/0.2ML SOAJ Inject 1 Act into the skin daily as needed. 11/07/20   Etta Grandchild, MD  glucose blood test strip Use Accu Chek Aviva Test Strips as instructed to check blood sugar three times daily. 12/07/22   Reather Littler, MD  HYDROcodone-acetaminophen [place]) 10-325 MG tablet One half to one tab every 8 hours as needed for spinal pain. Take with food. 10/03/23   Margaree Mackintosh, MD  Insulin Lispro Prot & Lispro (HUMALOG MIX 75/25 KWIKPEN) (75-25) 100 UNIT/ML Kwikpen Inject 36 Units into the skin in the morning and at bedtime. 10/12/23   Thapa, [place], MD  Insulin Pen Needle (B-D UF III MINI PEN NEEDLES) 31G X 5 MM MISC Inject 1 Act into the skin 2 (two) times daily. ADMINISTER 30 UNITS EVERY EVENING 12/17/21   Etta Grandchild, MD  ketoconazole (NIZORAL) 2 % cream Apply 1 application. topically 2 (two) times daily. 12/17/21   Etta Grandchild, MD  losartan (COZAAR) 25 MG tablet Take 1 tablet (25 mg total) by mouth daily. 10/12/21   Reather Littler, MD  rivaroxaban (XARELTO) 10 MG TABS tablet TAKE 1 TABLET BY MOUTH DAILY 04/20/23   Etta Grandchild, MD  simvastatin (ZOCOR) 40 MG tablet TAKE 1 TABLET BY MOUTH AT  BEDTIME 04/20/23   Etta Grandchild, MD  solifenacin (VESICARE) 10 MG tablet Take 1 tablet (10 mg total) by mouth daily. 12/17/21   Etta Grandchild, MD  tamsulosin (FLOMAX) 0.4 MG CAPS capsule Take 0.4 mg by mouth daily. 01/10/23   [provider]  traMADol (ULTRAM-ER) 100 MG 24 hr tablet Take 1 tablet (100 mg total) by mouth daily. 07/28/23   Etta Grandchild, MD     Family History  Problem Relation Age of Onset   Hypertension Mother    Diabetes Mother    Cancer Mother        ? location   Colon cancer Father 63   Colon polyps Neg Hx    Liver cancer Neg Hx     Social History   Socioeconomic History   Marital status: Widowed    Spouse name: Not on file   Number of children: 1   Years of education: 45    Highest education level: Never attended school  Occupational History   Occupation: retired    Comment: coal mills  Tobacco Use   Smoking status: Never   Smokeless tobacco: Never  Vaping Use   Vaping status: Never Used  Substance and Sexual Activity   Alcohol use: No    Alcohol/week: 0.0 standard drinks of alcohol    Comment: once every two months   Drug use: No   Sexual activity: Not Currently  Other Topics Concern   Not on file  Social History Narrative   Patient lives at home alone.. Patient is retired. Patient has high  school education.   Right handed.- Both   Caffeine- Coffee and soda   Social Drivers of Health   Financial Resource Strain: Medium Risk (09/30/2023)   Overall Financial Resource Strain (CARDIA)    Difficulty of Paying Living Expenses: Somewhat hard  Food Insecurity: Food Insecurity Present (09/30/2023)   Hunger Vital Sign    Worried About Running Out of Food in the Last Year: Sometimes true    Ran Out of Food in the Last Year: Sometimes true  Transportation Needs: No Transportation Needs (09/30/2023)   PRAPARE - Administrator, Civil Service (Medical): No    Lack of Transportation (Non-Medical): No  Physical Activity: Insufficiently Active (09/30/2023)   Exercise Vital Sign    Days of Exercise per Week: 2 days    Minutes of Exercise per Session: 20 min  Stress: No Stress Concern Present (09/30/2023)   [employer] of Occupational Health - Occupational Stress Questionnaire    Feeling of Stress : Only a little  Social Connections: Moderately Integrated (09/30/2023)   Social Connection and Isolation Panel [NHANES]    Frequency of Communication with Friends and Family: Twice a week    Frequency of Social Gatherings with Friends and Family: Once a week    Attends Religious Services: More than 4 times per year    Active Member of [employer] or Organizations: Yes    Attends Banker Meetings: More than 4 times per year    Marital  Status: Widowed    Review of Systems: A 12 point ROS discussed and pertinent positives are indicated in the HPI above.  All other systems are negative.   Vital Signs: There were no vitals taken for this visit.  Advance Care Plan: The advanced care plan/surrogate decision maker was discussed at the time of visit and documented in the medical record.    Physical Exam Constitutional:      General: He is not in acute distress. HENT:     Head: Normocephalic.     Mouth/Throat:     Mouth: Mucous membranes are moist.  Eyes:     General: No scleral icterus. Cardiovascular:     Rate and Rhythm: Normal rate and regular rhythm.  Pulmonary:     Effort: No respiratory distress.  Abdominal:     General: There is no distension.  Musculoskeletal:     Right lower leg: No edema.     Left lower leg: No edema.  Skin:    General: Skin is warm and dry.     Coloration: Skin is not jaundiced.  Neurological:     Mental Status: He is alert and oriented to person, place, and time.     Imaging:   Labs:  CBC: Recent Labs    07/28/23 1139  WBC 6.4  HGB 13.7  HCT 42.7  PLT 322.0    COAGS: No results for input(s): "INR", "APTT" in the last 8760 hours.  BMP: Recent Labs    12/02/22 0828 02/15/23 0925 04/25/23 1026  NA 140 137 142  K 3.7 3.9 3.7  CL 104 100 107  CO2 26 26 24   GLUCOSE 222* 330* 155*  BUN 10 18 13   CALCIUM 9.7 9.5 9.2  CREATININE 0.95 1.15 0.97    LIVER FUNCTION TESTS: Recent Labs    02/15/23 0925  BILITOT 0.6  AST 18  ALT 23  ALKPHOS 66  PROT 7.5  ALBUMIN 4.4    TUMOR MARKERS: No results for input(s): "AFPTM", "CEA", "CA199", "CHROMGRNA" in the  last 8760 hours.  Assessment and Plan: 78 year old male with a history of benign prostatic hyperplasia (117 g) with severe lower urinary tract symptoms (IPSS-QoL 26-3).  He would be an excellent candidate for prostate artery embolization.    We discussed the rationale, periprocedural expectations including  risks and benefits, and projected long term results.  He is amenable and wishes to proceed.  -obtain CTA Pelvis prior to procedure day -plan for prostate artery embolization via left radial artery approach with moderate sedation at [facility]   Marliss Coots, MD Pager: [phone]    I spent a total of  40 Minutes   in face to face in clinical consultation, greater than 50% of which was counseling/coordinating care for benign prostatic hyperplasia.

## 2023-11-28 ENCOUNTER — Other Ambulatory Visit: Payer: Self-pay

## 2023-11-28 ENCOUNTER — Other Ambulatory Visit: Payer: Self-pay | Admitting: Interventional Radiology

## 2023-11-28 ENCOUNTER — Ambulatory Visit
Admission: RE | Admit: 2023-11-28 | Discharge: 2023-11-28 | Disposition: A | Discharge status: Home / Self Care | Payer: 59 | Source: Ambulatory Visit | Attending: Urology

## 2023-11-28 DIAGNOSIS — N401 Enlarged prostate with lower urinary tract symptoms: Secondary | ICD-10-CM

## 2023-11-28 DIAGNOSIS — E1165 Type 2 diabetes mellitus with hyperglycemia: Secondary | ICD-10-CM

## 2023-11-28 DIAGNOSIS — E118 Type 2 diabetes mellitus with unspecified complications: Secondary | ICD-10-CM

## 2023-11-28 DIAGNOSIS — R3912 Poor urinary stream: Secondary | ICD-10-CM | POA: Diagnosis not present

## 2023-11-28 DIAGNOSIS — N3941 Urge incontinence: Secondary | ICD-10-CM | POA: Diagnosis not present

## 2023-11-28 HISTORY — PX: IR RADIOLOGIST EVAL & MGMT: IMG5224

## 2023-11-28 MED ORDER — DAPAGLIFLOZIN PROPANEDIOL 10 MG PO TABS: 10.0000 mg | ORAL_TABLET | Freq: Every day | ORAL | 3 refills | Status: AC

## 2023-11-29 ENCOUNTER — Other Ambulatory Visit: Payer: 59

## 2023-12-01 ENCOUNTER — Other Ambulatory Visit (HOSPITAL_COMMUNITY): Payer: Self-pay | Admitting: Interventional Radiology

## 2023-12-01 DIAGNOSIS — N138 Other obstructive and reflux uropathy: Secondary | ICD-10-CM

## 2023-12-06 ENCOUNTER — Ambulatory Visit
Admission: RE | Admit: 2023-12-06 | Discharge: 2023-12-06 | Disposition: A | Discharge status: Home / Self Care | Payer: No Typology Code available for payment source | Source: Ambulatory Visit | Attending: Interventional Radiology

## 2023-12-06 DIAGNOSIS — N401 Enlarged prostate with lower urinary tract symptoms: Secondary | ICD-10-CM | POA: Diagnosis not present

## 2023-12-06 DIAGNOSIS — N138 Other obstructive and reflux uropathy: Secondary | ICD-10-CM | POA: Diagnosis not present

## 2023-12-06 MED ORDER — IOPAMIDOL (ISOVUE-370) INJECTION 76%
75.0000 mL | Freq: Once | INTRAVENOUS | Status: AC | PRN
  Administered 2023-12-06: 75 mL via INTRAVENOUS

## 2023-12-09 ENCOUNTER — Other Ambulatory Visit (HOSPITAL_COMMUNITY): Payer: Self-pay | Admitting: Student

## 2023-12-09 ENCOUNTER — Telehealth (HOSPITAL_COMMUNITY): Payer: Self-pay | Admitting: Student

## 2023-12-09 DIAGNOSIS — N401 Enlarged prostate with lower urinary tract symptoms: Secondary | ICD-10-CM

## 2023-12-09 MED ORDER — SOLIFENACIN SUCCINATE 5 MG PO TABS: 5.0000 mg | ORAL_TABLET | Freq: Every day | ORAL | 0 refills | Status: AC

## 2023-12-09 MED ORDER — CIPROFLOXACIN HCL 500 MG PO TABS
500.0000 mg | ORAL_TABLET | Freq: Two times a day (BID) | ORAL | 0 refills | Status: AC
Start: 2023-12-09 — End: 2023-12-16

## 2023-12-09 MED ORDER — PHENAZOPYRIDINE HCL 100 MG PO TABS: 100.0000 mg | ORAL_TABLET | Freq: Three times a day (TID) | ORAL | 0 refills | Status: AC

## 2023-12-09 NOTE — Telephone Encounter (Signed)
 Patient scheduled for prostate artery embolization 12/13/23 with Dr. Elby Showers. Post-procedure medications e-prescribed to patient's pharmacy. These medications were discussed with the patient's brother, Ethelene Browns. Ethelene Browns is aware these medications are to be started after the procedure on 12/13/23. Ethelene Browns is aware of procedure time (10 am) and arrival to [facility] time (8 am).   Alwyn Ren, AGACNP-BC 12/09/2023, 11:04 AM

## 2023-12-13 ENCOUNTER — Other Ambulatory Visit (HOSPITAL_COMMUNITY): Payer: Self-pay | Admitting: Interventional Radiology

## 2023-12-13 ENCOUNTER — Ambulatory Visit (HOSPITAL_COMMUNITY): Payer: No Typology Code available for payment source

## 2023-12-13 ENCOUNTER — Ambulatory Visit (HOSPITAL_COMMUNITY)
Admission: RE | Admit: 2023-12-13 | Discharge: 2023-12-13 | Disposition: A | Discharge status: Home / Self Care | Source: Ambulatory Visit | Attending: Interventional Radiology | Admitting: Interventional Radiology

## 2023-12-13 ENCOUNTER — Other Ambulatory Visit: Payer: Self-pay

## 2023-12-13 DIAGNOSIS — N138 Other obstructive and reflux uropathy: Secondary | ICD-10-CM

## 2023-12-13 DIAGNOSIS — N401 Enlarged prostate with lower urinary tract symptoms: Secondary | ICD-10-CM

## 2023-12-13 HISTORY — PX: IR US GUIDE VASC ACCESS LEFT: IMG2389

## 2023-12-13 HISTORY — PX: IR ANGIOGRAM SELECTIVE EACH ADDITIONAL VESSEL: IMG667

## 2023-12-13 HISTORY — PX: IR EMBO ARTERIAL NOT HEMORR HEMANG INC GUIDE ROADMAPPING: IMG5448

## 2023-12-13 HISTORY — PX: IR US GUIDE VASC ACCESS RIGHT: IMG2390

## 2023-12-13 HISTORY — PX: IR ANGIOGRAM VISCERAL SELECTIVE: IMG657

## 2023-12-13 LAB — CBC
HCT: 45.2 % (ref 39.0–52.0)
Hemoglobin: 15.2 g/dL (ref 13.0–17.0)
MCH: 30.8 pg (ref 26.0–34.0)
MCHC: 33.6 g/dL (ref 30.0–36.0)
MCV: 91.7 fL (ref 80.0–100.0)
Platelets: 265 10*3/uL (ref 150–400)
RBC: 4.93 MIL/uL (ref 4.22–5.81)
RDW: 12.5 % (ref 11.5–15.5)
WBC: 5.9 10*3/uL (ref 4.0–10.5)
nRBC: 0 % (ref 0.0–0.2)

## 2023-12-13 LAB — BASIC METABOLIC PANEL
Anion gap: 13 (ref 5–15)
BUN: 20 mg/dL (ref 8–23)
CO2: 22 mmol/L (ref 22–32)
Calcium: 9 mg/dL (ref 8.9–10.3)
Chloride: 103 mmol/L (ref 98–111)
Creatinine, Ser: 1.13 mg/dL (ref 0.61–1.24)
GFR, Estimated: >60 mL/min (ref >60)
Glucose, Bld: 229 mg/dL — ABNORMAL HIGH (ref 70–99)
Potassium: 4.6 mmol/L (ref 3.5–5.1)
Sodium: 138 mmol/L (ref 135–145)

## 2023-12-13 LAB — PROTIME-INR
INR: 1.1 (ref 0.8–1.2)
Prothrombin Time: 14.3 s (ref 11.4–15.2)

## 2023-12-13 LAB — GLUCOSE, CAPILLARY
Glucose-Capillary: 232 mg/dL — ABNORMAL HIGH (ref 70–99)
Glucose-Capillary: 211 mg/dL — ABNORMAL HIGH (ref 70–99)

## 2023-12-13 MED ORDER — LIDOCAINE-EPINEPHRINE 1 %-1:100000 IJ SOLN
20.0000 mL | Freq: Once | INTRAMUSCULAR | Status: AC
  Administered 2023-12-13: 4 mL via INTRADERMAL

## 2023-12-13 MED ORDER — PREDNISONE 20 MG PO TABS
20.0000 mg | ORAL_TABLET | Freq: Once | ORAL | Status: AC
  Administered 2023-12-13: 20 mg via ORAL
  Filled 2023-12-13: qty 1

## 2023-12-13 MED ORDER — NITROGLYCERIN 1 MG/10 ML FOR IR/CATH LAB
INTRA_ARTERIAL | Status: AC
  Filled 2023-12-13: qty 10

## 2023-12-13 MED ORDER — FENTANYL CITRATE (PF) 100 MCG/2ML IJ SOLN
INTRAMUSCULAR | Status: AC | PRN
  Administered 2023-12-13 (×2): 25 ug via INTRAVENOUS

## 2023-12-13 MED ORDER — NITROGLYCERIN 2 % TD OINT
1.0000 [in_us] | TOPICAL_OINTMENT | Freq: Once | TRANSDERMAL | Status: AC
  Administered 2023-12-13: 1 [in_us] via TOPICAL
  Filled 2023-12-13: qty 1

## 2023-12-13 MED ORDER — CHLORHEXIDINE GLUCONATE CLOTH 2 % EX PADS: 6.0000 | MEDICATED_PAD | Freq: Every day | CUTANEOUS | Status: DC

## 2023-12-13 MED ORDER — LIDOCAINE-EPINEPHRINE 1 %-1:100000 IJ SOLN
INTRAMUSCULAR | Status: AC
Start: 2023-12-13 — End: ?
  Filled 2023-12-13: qty 1

## 2023-12-13 MED ORDER — IOHEXOL 300 MG/ML  SOLN
100.0000 mL | Freq: Once | INTRAMUSCULAR | Status: AC | PRN
  Administered 2023-12-13: 20 mL via INTRA_ARTERIAL

## 2023-12-13 MED ORDER — VERAPAMIL HCL 2.5 MG/ML IV SOLN: INTRA_ARTERIAL | Status: AC | PRN

## 2023-12-13 MED ORDER — LIDOCAINE-PRILOCAINE 2.5-2.5 % EX CREA
TOPICAL_CREAM | Freq: Once | CUTANEOUS | Status: AC
  Filled 2023-12-13: qty 5

## 2023-12-13 MED ORDER — MIDAZOLAM HCL 2 MG/2ML IJ SOLN
INTRAMUSCULAR | Status: AC | PRN
  Administered 2023-12-13 (×2): 1 mg via INTRAVENOUS

## 2023-12-13 MED ORDER — HEPARIN SODIUM (PORCINE) 1000 UNIT/ML IJ SOLN
INTRAMUSCULAR | Status: AC
  Filled 2023-12-13: qty 10

## 2023-12-13 MED ORDER — MIDAZOLAM HCL 2 MG/2ML IJ SOLN
INTRAMUSCULAR | Status: AC
  Filled 2023-12-13: qty 2

## 2023-12-13 MED ORDER — FENTANYL CITRATE (PF) 100 MCG/2ML IJ SOLN
INTRAMUSCULAR | Status: AC
  Filled 2023-12-13: qty 2

## 2023-12-13 MED ORDER — NITROGLYCERIN 1 MG/10 ML FOR IR/CATH LAB
INTRA_ARTERIAL | Status: AC | PRN
  Administered 2023-12-13: 100 ug
  Administered 2023-12-13: 200 ug

## 2023-12-13 MED ORDER — CIPROFLOXACIN IN D5W 400 MG/200ML IV SOLN
INTRAVENOUS | Status: AC
  Administered 2023-12-13: 400 mg via INTRAVENOUS
  Filled 2023-12-13: qty 200

## 2023-12-13 MED ORDER — VERAPAMIL HCL 2.5 MG/ML IV SOLN
INTRAVENOUS | Status: AC
  Filled 2023-12-13: qty 2

## 2023-12-13 MED ORDER — IOHEXOL 300 MG/ML  SOLN
150.0000 mL | Freq: Once | INTRAMUSCULAR | Status: AC | PRN
  Administered 2023-12-13: 70 mL via INTRA_ARTERIAL

## 2023-12-13 MED ORDER — LIDOCAINE HCL URETHRAL/MUCOSAL 2 % EX GEL
1.0000 | Freq: Once | CUTANEOUS | Status: AC
  Administered 2023-12-13: 1 via URETHRAL
  Filled 2023-12-13: qty 6

## 2023-12-13 MED ORDER — LIDOCAINE HCL (CARDIAC) PF 100 MG/5ML IV SOSY
PREFILLED_SYRINGE | INTRAVENOUS | Status: AC
  Filled 2023-12-13: qty 5

## 2023-12-13 MED ORDER — CIPROFLOXACIN IN D5W 400 MG/200ML IV SOLN: 400.0000 mg | INTRAVENOUS | Status: AC

## 2023-12-13 NOTE — H&P (Signed)
 Chief Complaint: Patient was seen in consultation today for benign prostatic hyperplasia with obstruction/lower urinary tract symptoms  Procedure: Prostate Artery Embolization  Referring Physician(s): Dr. Crist Fat  Supervising Physician: Marliss Coots  Patient Status: [facility] - Out-pt  History of Present Illness: Charles Parker is a 78 y.o. male with a history significant for DM, DVT, A fib on Xarelto, and longstanding history of BPH with urinary urgency/incontinence. He was previously evaluated and scheduled for a TURP approximately 20 years ago; however, due to poorly controlled diabetes the procedure was cancelled. His lower urinary tract symptoms have continued to progress over the past year and the patient now admits to voiding every 20 minutes, difficulty getting to the bathroom in time, and weak stream. He occasionally sees trace blood in his urine. Denies any episodes of acute urinary retention or UTIs. He follows with Dr. Marlou Porch in Urology. IPSS/QoL 26/3 at initial evaluation with Dr. Elby Showers on 11/28/23.  He is currently resting in bed with his daughter at the bedside. Patient denies any current complaints. VSS. Labs WNL.   Code Status: Full Code  Past Medical History:  Diagnosis Date   Clotting disorder (HCC)    DVT   Diabetes mellitus    type II   Diverticulosis    DVT (deep venous thrombosis) (HCC)    Glaucoma    no eye drops    Hepatic cyst    Hypercholesterolemia    Hypertension    Renal cyst    Sleep apnea    Tubulovillous adenoma    Urinary incontinence     Past Surgical History:  Procedure Laterality Date   BLADDER NECK RECONSTRUCTION  02/03/2012   Procedure: BLADDER NECK REPAIR;  Surgeon: Ardeth Sportsman, MD;  Location: [facility] ORS;  Service: General;;   COLONOSCOPY  02/2013   polyp removal(mult.)   IR RADIOLOGIST EVAL & MGMT  11/28/2023   KNEE SURGERY  2004   left   POLYPECTOMY     PROCTOSCOPY  02/03/2012   Procedure: PROCTOSCOPY;  Surgeon:  Ardeth Sportsman, MD;  Location: [facility] ORS;  Service: General;;   STOMACH SURGERY  02/2012   TEE WITHOUT CARDIOVERSION N/A 10/05/2016   Procedure: TRANSESOPHAGEAL ECHOCARDIOGRAM (TEE);  Surgeon: Thurmon Fair, MD;  Location: [facility] ENDOSCOPY;  Service: Cardiovascular;  Laterality: N/A;    Allergies: Metformin and related and Codeine  Medications: Prior to Admission medications   Medication Sig Start Date End Date Taking? Authorizing Provider  Cholecalciferol 50 MCG (2000 UT) TABS Take 1 tablet (2,000 Units total) by mouth daily. 11/08/20  Yes Etta Grandchild, MD  dapagliflozin propanediol (FARXIGA) 10 MG TABS tablet Take 1 tablet (10 mg total) by mouth daily. 11/28/23  Yes Shamleffer, Konrad Dolores, MD  ezetimibe (ZETIA) 10 MG tablet Take 1 tablet (10 mg total) by mouth daily. 02/22/23  Yes Reather Littler, MD  gabapentin (NEURONTIN) 300 MG capsule TAKE 1 CAPSULE(300 MG) BY MOUTH TWICE DAILY Patient taking differently: Take 300 mg by mouth daily. TAKE 1 CAPSULE(300 MG) BY MOUTH TWICE DAILY 10/21/20  Yes Reather Littler, MD  glimepiride (AMARYL) 4 MG tablet Take 1 tablet (4 mg total) by mouth daily before breakfast. 10/12/23  Yes Thapa, [place], MD  HYDROcodone-acetaminophen (NORCO) 10-325 MG tablet One half to one tab every 8 hours as needed for spinal pain. Take with food. 10/03/23  Yes Baxley, Luanna Cole, MD  Insulin Lispro Prot & Lispro (HUMALOG MIX 75/25 KWIKPEN) (75-25) 100 UNIT/ML Kwikpen Inject 36 Units into the skin in the  morning and at bedtime. 10/12/23  Yes Thapa, [place], MD  ketoconazole (NIZORAL) 2 % cream Apply 1 application. topically 2 (two) times daily. 12/17/21  Yes Etta Grandchild, MD  losartan (COZAAR) 25 MG tablet Take 1 tablet (25 mg total) by mouth daily. 10/12/21  Yes Reather Littler, MD  phenazopyridine (PYRIDIUM) 100 MG tablet Take 1 tablet (100 mg total) by mouth in the morning, at noon, and at bedtime for 7 days. 12/09/23 12/16/23 Yes Mickie Kay, NP  simvastatin (ZOCOR) 40 MG tablet TAKE 1 TABLET BY  MOUTH AT  BEDTIME 04/20/23  Yes Etta Grandchild, MD  solifenacin (VESICARE) 10 MG tablet Take 1 tablet (10 mg total) by mouth daily. 12/17/21  Yes Etta Grandchild, MD  tamsulosin (FLOMAX) 0.4 MG CAPS capsule Take 0.4 mg by mouth daily. 01/10/23  Yes [provider]  Alcohol Swabs PADS Use to clean area before insulin injections. DX E11.8 10/21/20   Reather Littler, MD  blood glucose meter kit and supplies KIT 1 each by Does not apply route daily as needed. Accu-Chek Aviva preferred by patient. Use up to four times daily as directed. (FOR ICD-9 250.00, 250.01).    [provider]  ciprofloxacin (CIPRO) 500 MG tablet Take 1 tablet (500 mg total) by mouth 2 (two) times daily for 7 days. 12/09/23 12/16/23  Mickie Kay, NP  Continuous Blood Gluc Receiver (FREESTYLE LIBRE 2 READER) DEVI 1 Act by Does not apply route daily. 02/06/21   Etta Grandchild, MD  Continuous Blood Gluc Sensor (FREESTYLE LIBRE 2 SENSOR) MISC 1 Act by Does not apply route daily. 02/06/21   Etta Grandchild, MD  Continuous Glucose Receiver (FREESTYLE LIBRE 3 READER) DEVI Use with free stype libre 3+ 10/12/23   Thapa, [place], MD  Continuous Glucose Sensor (FREESTYLE LIBRE 3 PLUS SENSOR) MISC 1 each by Does not apply route continuous. Change every 15 days. 10/12/23   Thapa, [place], MD  Glucagon (GVOKE HYPOPEN 2-PACK) 1 MG/0.2ML SOAJ Inject 1 Act into the skin daily as needed. 11/07/20   Etta Grandchild, MD  glucose blood test strip Use Accu Chek Aviva Test Strips as instructed to check blood sugar three times daily. 12/07/22   Reather Littler, MD  Insulin Pen Needle (B-D UF III MINI PEN NEEDLES) 31G X 5 MM MISC Inject 1 Act into the skin 2 (two) times daily. ADMINISTER 30 UNITS EVERY EVENING 12/17/21   Etta Grandchild, MD  rivaroxaban (XARELTO) 10 MG TABS tablet TAKE 1 TABLET BY MOUTH DAILY 04/20/23   Etta Grandchild, MD  solifenacin (VESICARE) 5 MG tablet Take 1 tablet (5 mg total) by mouth daily for 7 days. 12/09/23 12/16/23  Mickie Kay, NP   traMADol (ULTRAM-ER) 100 MG 24 hr tablet Take 1 tablet (100 mg total) by mouth daily. 07/28/23   Etta Grandchild, MD     Family History  Problem Relation Age of Onset   Hypertension Mother    Diabetes Mother    Cancer Mother        ? location   Colon cancer Father 35   Colon polyps Neg Hx    Liver cancer Neg Hx     Social History   Socioeconomic History   Marital status: Widowed    Spouse name: Not on file   Number of children: 1   Years of education: 40   Highest education level: Never attended school  Occupational History   Occupation: retired    Comment: Haematologist  [employer]  Tobacco Use   Smoking status: Never   Smokeless tobacco: Never  Vaping Use   Vaping status: Never Used  Substance and Sexual Activity   Alcohol use: No    Alcohol/week: 0.0 standard drinks of alcohol    Comment: once every two months   Drug use: No   Sexual activity: Not Currently  Other Topics Concern   Not on file  Social History Narrative   Patient lives at home alone.. Patient is retired. Patient has high school education.   Right handed.- Both   Caffeine- Coffee and soda   Social Drivers of Health   Financial Resource Strain: Medium Risk (09/30/2023)   Overall Financial Resource Strain (CARDIA)    Difficulty of Paying Living Expenses: Somewhat hard  Food Insecurity: Food Insecurity Present (09/30/2023)   Hunger Vital Sign    Worried About Running Out of Food in the Last Year: Sometimes true    Ran Out of Food in the Last Year: Sometimes true  Transportation Needs: No Transportation Needs (09/30/2023)   PRAPARE - Administrator, Civil Service (Medical): No    Lack of Transportation (Non-Medical): No  Physical Activity: Insufficiently Active (09/30/2023)   Exercise Vital Sign    Days of Exercise per Week: 2 days    Minutes of Exercise per Session: 20 min  Stress: No Stress Concern Present (09/30/2023)   [employer] of Occupational Health - Occupational Stress  Questionnaire    Feeling of Stress : Only a little  Social Connections: Moderately Integrated (09/30/2023)   Social Connection and Isolation Panel [NHANES]    Frequency of Communication with Friends and Family: Twice a week    Frequency of Social Gatherings with Friends and Family: Once a week    Attends Religious Services: More than 4 times per year    Active Member of [employer] or Organizations: Yes    Attends Banker Meetings: More than 4 times per year    Marital Status: Widowed    Review of Systems Denies any N/V, chest pain, shortness of breath, fevers/chills. All other ROS negative.  Vital Signs: BP (!) 136/93   Pulse 92   Temp 97.6 F (36.4 C) (Oral)   Ht 5' 11.5" (1.816 m)   Wt 170 lb (77.1 kg)   SpO2 91%   BMI 23.38 kg/m   Advance Care Plan: The advanced care plan/surrogate decision maker was discussed at the time of visit and documented in the medical record.    Physical Exam Vitals reviewed.  Constitutional:      Appearance: Normal appearance.  HENT:     Head: Normocephalic and atraumatic.     Mouth/Throat:     Mouth: Mucous membranes are moist.     Pharynx: Oropharynx is clear.  Cardiovascular:     Rate and Rhythm: Normal rate and regular rhythm.  Pulmonary:     Effort: Pulmonary effort is normal.  Abdominal:     General: Abdomen is flat.     Palpations: Abdomen is soft.  Musculoskeletal:        General: Normal range of motion.     Cervical back: Normal range of motion.  Skin:    General: Skin is warm.  Neurological:     General: No focal deficit present.     Mental Status: He is alert and oriented to person, place, and time. Mental status is at baseline.  Psychiatric:        Mood and Affect: Mood normal.  Behavior: Behavior normal.        Judgment: Judgment normal.     Imaging:   CT ANGIO PELVIS W OR WO CONTRAST Result Date: 12/08/2023 CLINICAL DATA:  78 year old male with history of benign prostatic hyperplasia and lower  urinary tract symptoms. Preprocedure planning study. EXAM: CT ANGIOGRAPHY OF PELVIS TECHNIQUE: Multidetector CT imaging of the abdomen and pelvis was performed using the standard protocol during bolus administration of intravenous contrast. Multiplanar reconstructed images and MIPs were obtained and reviewed to evaluate the vascular anatomy. RADIATION DOSE REDUCTION: This exam was performed according to the departmental dose-optimization program which includes automated exposure control, adjustment of the mA and/or kV according to patient size and/or use of iterative reconstruction technique. CONTRAST:  75mL ISOVUE-370 IOPAMIDOL (ISOVUE-370) INJECTION 76% COMPARISON:  10/21/2016 FINDINGS: VASCULAR Distal aorta: Not visualized on the study. Inflow: The bilateral common iliac arteries are patent and normal in caliber scattered atherosclerotic calcifications. Proximal Outflow: Bilateral common femoral and visualized portions of the superficial and profunda femoral arteries are patent without evidence of aneurysm, dissection, vasculitis or significant stenosis. Prostatic arteries: Duplicated right prostatic arteries arising from the proximal internal pudendal artery. Single left prostatic artery arises from the proximal internal pudendal artery. Veins: No obvious venous abnormality within the limitations of this arterial phase study. Review of the MIP images confirms the above findings. NON-VASCULAR Urinary Tract: Partially visualized right inferior pole simple appearing renal cysts, severely limited evaluation. No evidence of hydroureter. The bladder is mostly decompressed with mild diffuse wall thickening. Bowel: Postsurgical changes after rectosigmoid anastomosis without complicating features. No evidence of bowel distension or wall thickening. Lymphatic: No significant vascular findings are present. No enlarged abdominal or pelvic lymph nodes. Reproductive: The prostate gland is enlarged measuring approximately 6.5  x 5.7 x 9.4 cm with a calculated estimated volume of 182 g. There is median lobe bulge upon the base of the bladder. Other: No abdominal wall hernia or abnormality. No abdominopelvic ascites. Musculoskeletal: No acute or significant osseous findings. IMPRESSION: VASCULAR Duplicated right and single left prostatic arteries arise from the proximal internal pudendal arteries bilaterally ([place] type 4). NON-VASCULAR Prostatomegaly (182 g). Marliss Coots, MD [facility] Electronically Signed   By: Marliss Coots M.D.   On: 12/08/2023 16:16   IR Radiologist Eval & Mgmt Result Date: 11/28/2023 EXAM: NEW PATIENT OFFICE VISIT CHIEF COMPLAINT: See Epic note. HISTORY OF PRESENT ILLNESS: See Epic note. REVIEW OF SYSTEMS: See Epic note. PHYSICAL EXAMINATION: See Epic note. ASSESSMENT AND PLAN: See Epic note. Marliss Coots, MD [facility] Radiology Electronically Signed   By: Marliss Coots M.D.   On: 11/28/2023 09:00    Labs:  CBC: Recent Labs    07/28/23 1139 12/13/23 0853  WBC 6.4 5.9  HGB 13.7 15.2  HCT 42.7 45.2  PLT 322.0 265    COAGS: No results for input(s): "INR", "APTT" in the last 8760 hours.  BMP: Recent Labs    02/15/23 0925 04/25/23 1026  NA 137 142  K 3.9 3.7  CL 100 107  CO2 26 24  GLUCOSE 330* 155*  BUN 18 13  CALCIUM 9.5 9.2  CREATININE 1.15 0.97    LIVER FUNCTION TESTS: Recent Labs    02/15/23 0925  BILITOT 0.6  AST 18  ALT 23  ALKPHOS 66  PROT 7.5  ALBUMIN 4.4    TUMOR MARKERS: No results for input(s): "AFPTM", "CEA", "CA199", "CHROMGRNA" in the last 8760 hours.  Assessment and Plan:  Charles Parker is a 78 y.o. male with a history of benign prostatic hyperplasia (117g) with severe lower urinary tract symptoms (IPSS/QoL 26/3) who presents to [facility] Interventional Radiology department for an image-guided prostate artery embolization via left  radial artery approach with Dr. Elby Showers on 12/13/23. Procedure to be performed under moderate sedation.  The Risks and benefits of embolization were discussed with the patient including, but not limited to bleeding, infection, vascular injury, post operative pain, or contrast induced renal failure.  This procedure involves the use of X-rays and because of the nature of the planned procedure, it is possible that we will have prolonged use of X-ray fluoroscopy.  Potential radiation risks to you include (but are not limited to) the following: - A slightly elevated risk for cancer several years later in life. This risk is typically less than 0.5% percent. This risk is low in comparison to the normal incidence of human cancer, which is 33% for women and 50% for men according to the American Cancer Society. - Radiation induced injury can include skin redness, resembling a rash, tissue breakdown / ulcers and hair loss (which can be temporary or permanent).   The likelihood of either of these occurring depends on the difficulty of the procedure and whether you are sensitive to radiation due to previous procedures, disease, or genetic conditions.   IF your procedure requires a prolonged use of radiation, you will be notified and given written instructions for further action.  It is your responsibility to monitor the irradiated area for the 2 weeks following the procedure and to notify your physician if you are concerned that you have suffered a radiation induced injury.    All of the patient's questions were answered, patient is agreeable to proceed. Consent signed and in chart.   Thank you for this interesting consult. I greatly enjoyed meeting Charles Parker and look forward to participating in their care. A copy of this report was sent to the requesting provider on this date.  Electronically Signed: Jama Flavors, PA-C 12/13/2023, 9:24 AM   I spent a total of 30 Minutes  in face to face  clinical consultation, greater than 50% of which was counseling/coordinating care for prostate artery embolization.

## 2023-12-13 NOTE — Procedures (Signed)
 Interventional Radiology Procedure Note  Procedure: Prostate artery embolization  Findings: Please refer to procedural dictation for full description. Left radial artery approach, TR band applied with 14 mL at 11:55.  Complications: None immediate  Estimated Blood Loss: < 5 mL  Recommendations: TR band protocol release. Remove Foley now. IR will arrange 1 month outpatient follow up.   Marliss Coots, MD

## 2023-12-16 ENCOUNTER — Ambulatory Visit

## 2023-12-19 ENCOUNTER — Other Ambulatory Visit: Payer: Self-pay | Admitting: Interventional Radiology

## 2023-12-19 ENCOUNTER — Ambulatory Visit (INDEPENDENT_AMBULATORY_CARE_PROVIDER_SITE_OTHER)

## 2023-12-19 VITALS — Ht 68.5 in | Wt 170.0 lb

## 2023-12-19 DIAGNOSIS — N401 Enlarged prostate with lower urinary tract symptoms: Secondary | ICD-10-CM

## 2023-12-19 DIAGNOSIS — Z0000 Encounter for general adult medical examination without abnormal findings: Secondary | ICD-10-CM

## 2023-12-19 NOTE — Progress Notes (Signed)
 Subjective:   Charles Parker is a 78 y.o. who presents for a Medicare Wellness preventive visit.  Visit Complete: Virtual I connected with  Charles Parker on 12/19/23 by a audio enabled telemedicine application and verified that I am speaking with the correct person using two identifiers.  Patient Location: Home  Provider Location: Home Office  I discussed the limitations of evaluation and management by telemedicine. The patient expressed understanding and agreed to proceed.  Vital Signs: Because this visit was a virtual/telehealth visit, some criteria may be missing or patient reported. Any vitals not documented were not able to be obtained and vitals that have been documented are patient reported.  VideoDeclined- This patient declined Librarian, academic. Therefore the visit was completed with audio only.  Persons Participating in Visit: Patient assisted by brother.  AWV Questionnaire: No: Patient Medicare AWV questionnaire was not completed prior to this visit.  Cardiac Risk Factors include: hypertension;advanced age (>8men, >49 women);male gender;diabetes mellitus;dyslipidemia     Objective:    Today's Vitals   12/19/23 1004  Weight: 170 lb (77.1 kg)  Height: 5' 8.5" (1.74 m)   Body mass index is 25.47 kg/m.     12/19/2023   10:29 AM 12/13/2023    8:54 AM 08/17/2022    1:47 PM 05/07/2021   12:46 PM 11/12/2020    2:27 PM 05/24/2019    3:14 PM 04/18/2019    5:21 PM  Advanced Directives  Does Patient Have a Medical Advance Directive? No No Yes Yes Yes Yes No  Type of Best boy of [place];Living will Living will     Does patient want to make changes to medical advance directive?    No - Patient declined No - Patient declined    Copy of Healthcare Power of Attorney in Chart?   No - copy requested      Would patient like information on creating a medical advance directive? No - Patient declined      No - Patient  declined    Current Medications (verified) Outpatient Encounter Medications as of 12/19/2023  Medication Sig   Alcohol Swabs PADS Use to clean area before insulin injections. DX E11.8   blood glucose meter kit and supplies KIT 1 each by Does not apply route daily as needed. Accu-Chek Aviva preferred by patient. Use up to four times daily as directed. (FOR ICD-9 250.00, 250.01).   Cholecalciferol 50 MCG (2000 UT) TABS Take 1 tablet (2,000 Units total) by mouth daily.   Continuous Blood Gluc Receiver (FREESTYLE LIBRE 2 READER) DEVI 1 Act by Does not apply route daily.   Continuous Blood Gluc Sensor (FREESTYLE LIBRE 2 SENSOR) MISC 1 Act by Does not apply route daily.   Continuous Glucose Receiver (FREESTYLE LIBRE 3 READER) DEVI Use with free stype libre 3+   Continuous Glucose Sensor (FREESTYLE LIBRE 3 PLUS SENSOR) MISC 1 each by Does not apply route continuous. Change every 15 days.   dapagliflozin propanediol (FARXIGA) 10 MG TABS tablet Take 1 tablet (10 mg total) by mouth daily.   ezetimibe (ZETIA) 10 MG tablet Take 1 tablet (10 mg total) by mouth daily.   glimepiride (AMARYL) 4 MG tablet Take 1 tablet (4 mg total) by mouth daily before breakfast.   Glucagon (GVOKE HYPOPEN 2-PACK) 1 MG/0.2ML SOAJ Inject 1 Act into the skin daily as needed.   glucose blood test strip Use Accu Chek Aviva Test Strips as instructed to check blood sugar three times  daily.   HYDROcodone-acetaminophen (NORCO) 10-325 MG tablet One half to one tab every 8 hours as needed for spinal pain. Take with food.   Insulin Lispro Prot & Lispro (HUMALOG MIX 75/25 KWIKPEN) (75-25) 100 UNIT/ML Kwikpen Inject 36 Units into the skin in the morning and at bedtime.   Insulin Pen Needle (B-D UF III MINI PEN NEEDLES) 31G X 5 MM MISC Inject 1 Act into the skin 2 (two) times daily. ADMINISTER 30 UNITS EVERY EVENING   ketoconazole (NIZORAL) 2 % cream Apply 1 application. topically 2 (two) times daily.   rivaroxaban (XARELTO) 10 MG TABS tablet  TAKE 1 TABLET BY MOUTH DAILY   simvastatin (ZOCOR) 40 MG tablet TAKE 1 TABLET BY MOUTH AT  BEDTIME   solifenacin (VESICARE) 10 MG tablet Take 1 tablet (10 mg total) by mouth daily.   tamsulosin (FLOMAX) 0.4 MG CAPS capsule Take 0.4 mg by mouth daily.   gabapentin (NEURONTIN) 300 MG capsule TAKE 1 CAPSULE(300 MG) BY MOUTH TWICE DAILY (Patient not taking: Reported on 12/19/2023)   losartan (COZAAR) 25 MG tablet Take 1 tablet (25 mg total) by mouth daily. (Patient not taking: Reported on 12/19/2023)   traMADol (ULTRAM-ER) 100 MG 24 hr tablet Take 1 tablet (100 mg total) by mouth daily. (Patient not taking: Reported on 12/19/2023)   No facility-administered encounter medications on file as of 12/19/2023.    Allergies (verified) Metformin and related and Codeine   History: Past Medical History:  Diagnosis Date   Clotting disorder (HCC)    DVT   Diabetes mellitus    type II   Diverticulosis    DVT (deep venous thrombosis) (HCC)    Glaucoma    no eye drops    Hepatic cyst    Hypercholesterolemia    Hypertension    Renal cyst    Sleep apnea    Tubulovillous adenoma    Urinary incontinence    Past Surgical History:  Procedure Laterality Date   BLADDER NECK RECONSTRUCTION  02/03/2012   Procedure: BLADDER NECK REPAIR;  Surgeon: Ardeth Sportsman, MD;  Location: [facility];  Service: General;;   COLONOSCOPY  02/2013   polyp removal(mult.)   IR ANGIOGRAM SELECTIVE EACH ADDITIONAL VESSEL  12/13/2023   IR ANGIOGRAM SELECTIVE EACH ADDITIONAL VESSEL  12/13/2023   IR ANGIOGRAM VISCERAL SELECTIVE  12/13/2023   IR EMBO ARTERIAL NOT HEMORR HEMANG INC GUIDE ROADMAPPING  12/13/2023   IR RADIOLOGIST EVAL & MGMT  11/28/2023   IR US GUIDE VASC ACCESS RIGHT  12/13/2023   KNEE SURGERY  2004   left   POLYPECTOMY     PROCTOSCOPY  02/03/2012   Procedure: PROCTOSCOPY;  Surgeon: Ardeth Sportsman, MD;  Location: [facility];  Service: General;;   STOMACH SURGERY  02/2012   TEE WITHOUT CARDIOVERSION N/A 10/05/2016   Procedure:  TRANSESOPHAGEAL ECHOCARDIOGRAM (TEE);  Surgeon: Thurmon Fair, MD;  Location: [facility] ENDOSCOPY;  Service: Cardiovascular;  Laterality: N/A;   Family History  Problem Relation Age of Onset   Hypertension Mother    Diabetes Mother    Cancer Mother        ? location   Colon cancer Father 19   Colon polyps Neg Hx    Liver cancer Neg Hx    Social History   Socioeconomic History   Marital status: Widowed    Spouse name: Not on file   Number of children: 1   Years of education: 14   Highest education level: Never attended school  Occupational History  Occupation: retired    Comment: coal mills  Tobacco Use   Smoking status: Never   Smokeless tobacco: Never  Vaping Use   Vaping status: Never Used  Substance and Sexual Activity   Alcohol use: No    Alcohol/week: 0.0 standard drinks of alcohol    Comment: once every two months   Drug use: No   Sexual activity: Not Currently  Other Topics Concern   Not on file  Social History Narrative   Patient lives at home alone.. Patient is retired. Patient has high school education.   Right handed.- Both   Caffeine- Coffee and soda   Social Drivers of Corporate investment banker Strain: Low Risk  (12/19/2023)   Overall Financial Resource Strain (CARDIA)    Difficulty of Paying Living Expenses: Not very hard  Recent Concern: Financial Resource Strain - Medium Risk (09/30/2023)   Overall Financial Resource Strain (CARDIA)    Difficulty of Paying Living Expenses: Somewhat hard  Food Insecurity: No Food Insecurity (12/19/2023)   Hunger Vital Sign    Worried About Running Out of Food in the Last Year: Never true    Ran Out of Food in the Last Year: Never true  Recent Concern: Food Insecurity - Food Insecurity Present (09/30/2023)   Hunger Vital Sign    Worried About Running Out of Food in the Last Year: Sometimes true    Ran Out of Food in the Last Year: Sometimes true  Transportation Needs: No Transportation Needs (12/19/2023)   PRAPARE -  Administrator, Civil Service (Medical): No    Lack of Transportation (Non-Medical): No  Physical Activity: Inactive (12/19/2023)   Exercise Vital Sign    Days of Exercise per Week: 0 days    Minutes of Exercise per Session: 0 min  Stress: No Stress Concern Present (12/19/2023)   [employer] of Occupational Health - Occupational Stress Questionnaire    Feeling of Stress : Not at all  Social Connections: Moderately Integrated (12/19/2023)   Social Connection and Isolation Panel [NHANES]    Frequency of Communication with Friends and Family: Three times a week    Frequency of Social Gatherings with Friends and Family: Once a week    Attends Religious Services: More than 4 times per year    Active Member of [employer] or Organizations: Yes    Attends Banker Meetings: Never    Marital Status: Widowed    Tobacco Counseling Counseling given: Not Answered    Clinical Intake:  Pre-visit preparation completed: Yes  Pain : No/denies pain     BMI - recorded: 25.47 Nutritional Status: BMI 25 -29 Overweight Nutritional Risks: None Diabetes: Yes CBG done?: No Did pt. bring in CBG monitor from home?: No  How often do you need to have someone help you when you read instructions, pamphlets, or other written materials from your doctor or pharmacy?: 1 - Never  Interpreter Needed?: No  Information entered by :: Ty Oshima,RMA   Activities of Daily Living     12/19/2023   10:05 AM  In your present state of health, do you have any difficulty performing the following activities:  Hearing? 0  Vision? 0  Difficulty concentrating or making decisions? 0  Walking or climbing stairs? 0  Dressing or bathing? 0  Doing errands, shopping? 0  Preparing Food and eating ? N  Using the Toilet? N  In the past six months, have you accidently leaked urine? Y  Do you have problems with  loss of bowel control? N  Managing your Medications? N  Managing your Finances? N   Housekeeping or managing your Housekeeping? N    Patient Care Team: Etta Grandchild, MD as PCP - General (Internal Medicine) Charlott Rakes, MD as Consulting Physician (Gastroenterology) Carlus Pavlov, MD as Consulting Physician (Internal Medicine) Helane Gunther, DPM as Consulting Physician (Podiatry) Sallye Lat, MD as Consulting Physician (Ophthalmology) Bonita Quin, [facility] (Pharmacist)  Indicate any recent Medical Services you may have received from other than [facility] providers in the past year (date may be approximate).     Assessment:   This is a routine wellness examination for Charles Parker.  Hearing/Vision screen Hearing Screening - Comments:: Denies hearing difficulties   Vision Screening - Comments:: Wears eyeglasses   Goals Addressed   None    Depression Screen     12/19/2023   10:33 AM 08/17/2022    1:46 PM 12/22/2021    1:31 PM 05/07/2021   12:56 PM 11/12/2020    2:28 PM 12/12/2019   11:43 AM 06/19/2019   10:57 AM  PHQ 2/9 Scores  PHQ - 2 Score 1 0 1 0 0 0 0  PHQ- 9 Score 1          Fall Risk     12/19/2023   10:39 AM 07/28/2023   10:56 AM 08/17/2022    1:48 PM 05/18/2022    9:35 AM 03/08/2022    9:45 AM  Fall Risk   Falls in the past year? 0 0 0 0 0  Comment    denies new/ recent falls x 12 months; uses cane as needed Continues to deny falls x 12 months; occasionally uses cane as/ if indicated  Number falls in past yr: 0 0 0 0 0  Injury with Fall? 0 0 0 0 0  Comment    N/A- no falls reported N/A- no falls reported  Risk for fall due to : No Fall Risks No Fall Risks No Fall Risks Medication side effect;Impaired mobility Medication side effect  Follow up Falls prevention discussed;Falls evaluation completed Falls evaluation completed Falls prevention discussed Falls prevention discussed Falls prevention discussed   MEDICARE RISK AT HOME:  Medicare Risk at Home Any stairs in or around the home?: No If so, are there any without handrails?:  No Home free of loose throw rugs in walkways, pet beds, electrical cords, etc?: Yes Adequate lighting in your home to reduce risk of falls?: Yes Life alert?: No Use of a cane, walker or w/c?: No Grab bars in the bathroom?: Yes Shower chair or bench in shower?: Yes Elevated toilet seat or a handicapped toilet?: Yes  TIMED UP AND GO:  Was the test performed?  No  Cognitive Function: 6CIT completed    01/04/2018    3:28 PM 11/13/2015    9:22 AM  MMSE - Mini Mental State Exam  Orientation to time 5 5  Orientation to Place 5 5  Registration 3 3  Attention/ Calculation 3 5  Recall 1 3  Language- name 2 objects 2 2  Language- repeat 1 1  Language- follow 3 step command 3 3  Language- read & follow direction 1 1  Write a sentence 1 1  Copy design 1 1  Total score 26 30        12/19/2023   10:07 AM 08/17/2022    1:48 PM  6CIT Screen  What Year? 0 points 0 points  What month? 0 points 0 points  What time? 0 points  0 points  Count back from 20 0 points 0 points  Months in reverse 0 points 0 points  Repeat phrase 2 points 0 points  Total Score 2 points 0 points    Immunizations Immunization History  Administered Date(s) Administered   Fluad Quad(high Dose 65+) 07/17/2019, 07/08/2020   Influenza, High Dose Seasonal PF 06/13/2017, 07/06/2018, 07/21/2022, 07/04/2023   Influenza,inj,Quad PF,6+ Mos 06/20/2013, 07/10/2014   Influenza-Unspecified 06/05/2015, 07/26/2016, 11/04/2021   PFIZER(Purple Top)SARS-COV-2 Vaccination 10/26/2019, 11/16/2019, 07/19/2020   PPD Test 10/29/2016   Pfizer(Comirnaty)Fall Seasonal Vaccine 12 years and older 07/19/2022   Pneumococcal Conjugate-13 06/20/2013, 07/23/2014   Pneumococcal Polysaccharide-23 06/19/2015, 02/05/2021   Tdap 06/20/2013   Zoster Recombinant(Shingrix) 07/17/2019    Screening Tests Health Maintenance  Topic Date Due   Zoster Vaccines- Shingrix (2 of 2) 09/11/2019   OPHTHALMOLOGY EXAM  01/27/2023   COVID-19 Vaccine (5 -  2024-25 season) 06/05/2023   DTaP/Tdap/Td (2 - Td or Tdap) 06/21/2023   Medicare Annual Wellness (AWV)  08/18/2023   Diabetic kidney evaluation - Urine ACR  02/15/2024   FOOT EXAM  02/22/2024   HEMOGLOBIN A1C  04/10/2024   Diabetic kidney evaluation - eGFR measurement  12/12/2024   Colonoscopy  03/17/2026   Pneumonia Vaccine 65+ Years old  Completed   INFLUENZA VACCINE  Completed   Hepatitis C Screening  Completed   HPV VACCINES  Aged Out    Health Maintenance  Health Maintenance Due  Topic Date Due   Zoster Vaccines- Shingrix (2 of 2) 09/11/2019   OPHTHALMOLOGY EXAM  01/27/2023   COVID-19 Vaccine (5 - 2024-25 season) 06/05/2023   DTaP/Tdap/Td (2 - Td or Tdap) 06/21/2023   Medicare Annual Wellness (AWV)  08/18/2023   Health Maintenance Items Addressed: See Nurse Notes  Additional Screening:  Vision Screening: Recommended annual ophthalmology exams for early detection of glaucoma and other disorders of the eye.  Dental Screening: Recommended annual dental exams for proper oral hygiene  Community Resource Referral / Chronic Care Management: CRR required this visit?  No   CCM required this visit?  No     Plan:     I have personally reviewed and noted the following in the patient's chart:   Medical and social history Use of alcohol, tobacco or illicit drugs  Current medications and supplements including opioid prescriptions. Patient is currently taking opioid prescriptions. Information provided to patient regarding non-opioid alternatives. Patient advised to discuss non-opioid treatment plan with their provider. Functional ability and status Nutritional status Physical activity Advanced directives List of other physicians Hospitalizations, surgeries, and ER visits in previous 12 months Vitals Screenings to include cognitive, depression, and falls Referrals and appointments  In addition, I have reviewed and discussed with patient certain preventive protocols,  quality metrics, and best practice recommendations. A written personalized care plan for preventive services as well as general preventive health recommendations were provided to patient.     Ruven Corradi L Tylah Mancillas, CMA   12/19/2023   After Visit Summary: (MyChart) Due to this being a telephonic visit, the after visit summary with patients personalized plan was offered to patient via MyChart   Notes: Please refer to Routing Comments.

## 2023-12-19 NOTE — Patient Instructions (Signed)
 [name] , Thank you for taking time to come for your Medicare Wellness Visit. I appreciate your ongoing commitment to your health goals. Please review the following plan we discussed and let me know if I can assist you in the future.   Referrals/Orders/Follow-Ups/Clinician Recommendations: It was nice speaking with you and your brother today.   Please inform Dr. Dione Booze 's office staff to send over your diabetic eye exam over to this office please.    This is a list of the screening recommended for you and due dates:  Health Maintenance  Topic Date Due   Zoster (Shingles) Vaccine (2 of 2) 09/11/2019   Eye exam for diabetics  01/27/2023   COVID-19 Vaccine (5 - 2024-25 season) 06/05/2023   DTaP/Tdap/Td vaccine (2 - Td or Tdap) 06/21/2023   Medicare Annual Wellness Visit  08/18/2023   Yearly kidney health urinalysis for diabetes  02/15/2024   Complete foot exam   02/22/2024   Hemoglobin A1C  04/10/2024   Yearly kidney function blood test for diabetes  12/12/2024   Colon Cancer Screening  03/17/2026   Pneumonia Vaccine  Completed   Flu Shot  Completed   Hepatitis C Screening  Completed   HPV Vaccine  Aged Out    Advanced directives: (Declined) Advance directive discussed with you today. Even though you declined this today, please call our office should you change your mind, and we can give you the proper paperwork for you to fill out.  Next Medicare Annual Wellness Visit scheduled for next year: Yes  Managing Pain Without Opioids Opioids are strong medicines used to treat moderate to severe pain. For some people, especially those who have long-term (chronic) pain, opioids may not be the best choice for pain management due to: Side effects like nausea, constipation, and sleepiness. The risk of addiction (opioid use disorder). The longer you take opioids, the greater your risk of addiction. Pain that lasts for more than 3 months is called chronic pain. Managing chronic pain usually  requires more than one approach and is often provided by a team of health care providers working together (multidisciplinary approach). Pain management may be done at a pain management center or pain clinic. How to manage pain without the use of opioids Use non-opioid medicines Non-opioid medicines for pain may include: Over-the-counter or prescription non-steroidal anti-inflammatory drugs (NSAIDs). These may be the first medicines used for pain. They work well for muscle and bone pain, and they reduce swelling. Acetaminophen. This over-the-counter medicine may work well for milder pain but not swelling. Antidepressants. These may be used to treat chronic pain. A certain type of antidepressant (tricyclics) is often used. These medicines are given in lower doses for pain than when used for depression. Anticonvulsants. These are usually used to treat seizures but may also reduce nerve (neuropathic) pain. Muscle relaxants. These relieve pain caused by sudden muscle tightening (spasms). You may also use a pain medicine that is applied to the skin as a patch, cream, or gel (topical analgesic), such as a numbing medicine. These may cause fewer side effects than medicines taken by mouth. Do certain therapies as directed Some therapies can help with pain management. They include: Physical therapy. You will do exercises to gain strength and flexibility. A physical therapist may teach you exercises to move and stretch parts of your body that are weak, stiff, or painful. You can learn these exercises at physical therapy visits and practice them at home. Physical therapy may also involve: Massage. Heat wraps or  applying heat or cold to affected areas. Electrical signals that interrupt pain signals (transcutaneous electrical nerve stimulation, TENS). Weak lasers that reduce pain and swelling (low-level laser therapy). Signals from your body that help you learn to regulate pain (biofeedback). Occupational  therapy. This helps you to learn ways to function at home and work with less pain. Recreational therapy. This involves trying new activities or hobbies, such as a physical activity or drawing. Mental health therapy, including: Cognitive behavioral therapy (CBT). This helps you learn coping skills for dealing with pain. Acceptance and commitment therapy (ACT) to change the way you think and react to pain. Relaxation therapies, including muscle relaxation exercises and mindfulness-based stress reduction. Pain management counseling. This may be individual, family, or group counseling.  Receive medical treatments Medical treatments for pain management include: Nerve block injections. These may include a pain blocker and anti-inflammatory medicines. You may have injections: Near the spine to relieve chronic back or neck pain. Into joints to relieve back or joint pain. Into nerve areas that supply a painful area to relieve body pain. Into muscles (trigger point injections) to relieve some painful muscle conditions. A medical device placed near your spine to help block pain signals and relieve nerve pain or chronic back pain (spinal cord stimulation device). Acupuncture. Follow these instructions at home Medicines Take over-the-counter and prescription medicines only as told by your health care provider. If you are taking pain medicine, ask your health care providers about possible side effects to watch out for. Do not drive or use heavy machinery while taking prescription opioid pain medicine. Lifestyle  Do not use drugs or alcohol to reduce pain. If you drink alcohol, limit how much you have to: 0-1 drink a day for women who are not pregnant. 0-2 drinks a day for men. Know how much alcohol is in a drink. In the [place] one drink equals one 12 oz bottle of beer (355 mL), one 5 oz glass of wine (148 mL), or one 1 oz glass of hard liquor (44 mL). Do not use any products that contain nicotine or  tobacco. These products include cigarettes, chewing tobacco, and vaping devices, such as e-cigarettes. If you need help quitting, ask your health care provider. Eat a healthy diet and maintain a healthy weight. Poor diet and excess weight may make pain worse. Eat foods that are high in fiber. These include fresh fruits and vegetables, whole grains, and beans. Limit foods that are high in fat and processed sugars, such as fried and sweet foods. Exercise regularly. Exercise lowers stress and may help relieve pain. Ask your health care provider what activities and exercises are safe for you. If your health care provider approves, join an exercise class that combines movement and stress reduction. Examples include yoga and tai chi. Get enough sleep. Lack of sleep may make pain worse. Lower stress as much as possible. Practice stress reduction techniques as told by your therapist. General instructions Work with all your pain management providers to find the treatments that work best for you. You are an important member of your pain management team. There are many things you can do to reduce pain on your own. Consider joining an online or in-person support group for people who have chronic pain. Keep all follow-up visits. This is important. Where to find more information You can find more information about managing pain without opioids from: American Academy of Pain Medicine: painmed.org Institute for Chronic Pain: instituteforchronicpain.org American Chronic Pain Association: theacpa.org Contact a health care  provider if: You have side effects from pain medicine. Your pain gets worse or does not get better with treatments or home therapy. You are struggling with anxiety or depression. Summary Many types of pain can be managed without opioids. Chronic pain may respond better to pain management without opioids. Pain is best managed when you and a team of health care providers work together. Pain  management without opioids may include non-opioid medicines, medical treatments, physical therapy, mental health therapy, and lifestyle changes. Tell your health care providers if your pain gets worse or is not being managed well enough. This information is not intended to replace advice given to you by your health care provider. Make sure you discuss any questions you have with your health care provider. Document Revised: 12/31/2020 Document Reviewed: 12/31/2020 Elsevier Patient Education  2024 [employer].

## 2023-12-20 ENCOUNTER — Other Ambulatory Visit: Payer: Self-pay | Admitting: Internal Medicine

## 2023-12-20 DIAGNOSIS — H40013 Open angle with borderline findings, low risk, bilateral: Secondary | ICD-10-CM | POA: Diagnosis not present

## 2023-12-20 DIAGNOSIS — H2513 Age-related nuclear cataract, bilateral: Secondary | ICD-10-CM | POA: Diagnosis not present

## 2023-12-20 DIAGNOSIS — H1711 Central corneal opacity, right eye: Secondary | ICD-10-CM | POA: Diagnosis not present

## 2023-12-20 DIAGNOSIS — E119 Type 2 diabetes mellitus without complications: Secondary | ICD-10-CM | POA: Diagnosis not present

## 2023-12-20 DIAGNOSIS — Z23 Encounter for immunization: Secondary | ICD-10-CM | POA: Insufficient documentation

## 2023-12-20 MED ORDER — SHINGRIX 50 MCG/0.5ML IM SUSR: 0.5000 mL | Freq: Once | INTRAMUSCULAR | 0 refills | Status: AC

## 2023-12-20 MED ORDER — BOOSTRIX 5-2.5-18.5 LF-MCG/0.5 IM SUSP: 0.5000 mL | Freq: Once | INTRAMUSCULAR | 0 refills | Status: AC

## 2023-12-23 ENCOUNTER — Encounter (HOSPITAL_COMMUNITY): Payer: Self-pay

## 2023-12-30 ENCOUNTER — Telehealth: Payer: Self-pay | Admitting: Pharmacy Technician

## 2023-12-30 NOTE — Progress Notes (Addendum)
 Patient engaged with clinical pharmacist for management of diabetes on 11/01/2023. Outreach by [employer] technician was requested.   Outreached patient to discuss diabetes medication management. Left voicemail for patient to return my call at their convenience.   Of note, patient was also left a message on 12/23/23.  Pattricia Boss, CPhT [place]  Office: [phone] Fax: [phone] Email: [email or web address]

## 2024-01-03 ENCOUNTER — Telehealth: Payer: Self-pay | Admitting: Pharmacy Technician

## 2024-01-03 NOTE — Progress Notes (Signed)
   01/03/2024  Patient ID: Charles Parker, male   DOB: 1946-02-14, 78 y.o.   MRN: [id number]  Patient engaged with clinical pharmacist for management of diabetes on 11/01/2023. Outreach by [employer] technician was requested.   Outreached patient to discuss diabetes medication management. Left voicemail for patient to return my call at their convenience.   Pattricia Boss, CPhT [place]  Office: [phone] Fax: [phone] Email: [email or web address]

## 2024-01-06 ENCOUNTER — Telehealth: Payer: Self-pay | Admitting: Pharmacy Technician

## 2024-01-06 NOTE — Progress Notes (Signed)
 This encounter was conducted via the [employer] providing interactive audio and visual communication. The patient provided verbal consent to conduct a virtual appointment. The patient was located at their primary residence during this encounter.  Referring Physician(s): Dr. Marlou Porch   Chief Complaint: The patient is seen in virtual video follow up today s/p prostate artery embolization 12/13/23  History of present illness: HPI from initial consultation 11/28/23 Charles Parker is a 78 y.o. male with a medical history significant for DM, DVT, atrial fibrillation (Xarelto), HTN and thoracic osteomyelitis/discitis/fracture/fusion. He underwent extensive spine surgery in 2018 with Dr. Franky Macho. He also has a longstanding history of BPH with urinary urgency/incontinence. He was scheduled for a TURP approximately 20 years ago but this was cancelled due to poorly controlled diabetes. His lower urinary tract symptoms have progressed over the past year and he is now voiding every 20 minutes. He has difficulty getting to the bathroom in time. He also complains of a weak stream. At his last visit with Urology he reported being intermittently compliant with his medications (Finasteride, Myrbetriq and Tamsulosin) and he expressed a desire to pursue treatment for his BPH. The patient has kindly been referred to Interventional Radiology by Dr. Marlou Porch to discuss prostate artery embolization.   He has never experienced acute urinary retention.  He occasionally sees trace blood in his urine.  No UTIs.  We discussed the rationale, periprocedural expectations including risks and benefits, and projected long term results with prostate artery embolization. He was amenable and wished to proceed. This was performed 12/13/23 and he tolerated the procedure well. He was discharged home the same day. He presents today for follow up via virtual video visit.   He is doing well since the procedure.  His frequency has  significantly improved from going every 20 minutes to about every 2-2.5 hrs.  He states that his stream has gotten stronger.  He still has a few instances of urinary frequency.  No blood in the urine.  No pain.    Today's IPSS/QoL      Past Medical History:  Diagnosis Date   Clotting disorder (HCC)    DVT   Diabetes mellitus    type II   Diverticulosis    DVT (deep venous thrombosis) (HCC)    Glaucoma    no eye drops    Hepatic cyst    Hypercholesterolemia    Hypertension    Renal cyst    Sleep apnea    Tubulovillous adenoma    Urinary incontinence     Past Surgical History:  Procedure Laterality Date   BLADDER NECK RECONSTRUCTION  02/03/2012   Procedure: BLADDER NECK REPAIR;  Surgeon: Ardeth Sportsman, MD;  Location: [facility];  Service: General;;   COLONOSCOPY  02/2013   polyp removal(mult.)   IR ANGIOGRAM SELECTIVE EACH ADDITIONAL VESSEL  12/13/2023   IR ANGIOGRAM SELECTIVE EACH ADDITIONAL VESSEL  12/13/2023   IR ANGIOGRAM VISCERAL SELECTIVE  12/13/2023   IR EMBO ARTERIAL NOT HEMORR HEMANG INC GUIDE ROADMAPPING  12/13/2023   IR RADIOLOGIST EVAL & MGMT  11/28/2023   IR US GUIDE VASC ACCESS LEFT  12/13/2023   IR US GUIDE VASC ACCESS LEFT  12/13/2023   IR US GUIDE VASC ACCESS RIGHT  12/13/2023   KNEE SURGERY  2004   left   POLYPECTOMY     PROCTOSCOPY  02/03/2012   Procedure: PROCTOSCOPY;  Surgeon: Ardeth Sportsman, MD;  Location: [facility];  Service: General;;   STOMACH SURGERY  02/2012  TEE WITHOUT CARDIOVERSION N/A 10/05/2016   Procedure: TRANSESOPHAGEAL ECHOCARDIOGRAM (TEE);  Surgeon: Thurmon Fair, MD;  Location: [facility];  Service: Cardiovascular;  Laterality: N/A;    Allergies: Metformin and related and Codeine  Medications: Prior to Admission medications   Medication Sig Start Date End Date Taking? Authorizing Provider  Alcohol Swabs PADS Use to clean area before insulin injections. DX E11.8 10/21/20   Reather Littler, MD  blood glucose meter kit and supplies KIT 1 each by  Does not apply route daily as needed. Accu-Chek Aviva preferred by patient. Use up to four times daily as directed. (FOR ICD-9 250.00, 250.01).    [provider]  Cholecalciferol 50 MCG (2000 UT) TABS Take 1 tablet (2,000 Units total) by mouth daily. 11/08/20   Etta Grandchild, MD  Continuous Blood Gluc Receiver (FREESTYLE LIBRE 2 READER) DEVI 1 Act by Does not apply route daily. 02/06/21   Etta Grandchild, MD  Continuous Blood Gluc Sensor (FREESTYLE LIBRE 2 SENSOR) MISC 1 Act by Does not apply route daily. 02/06/21   Etta Grandchild, MD  Continuous Glucose Receiver (FREESTYLE LIBRE 3 READER) DEVI Use with free stype libre 3+ 10/12/23   Thapa, [place], MD  Continuous Glucose Sensor (FREESTYLE LIBRE 3 PLUS SENSOR) MISC 1 each by Does not apply route continuous. Change every 15 days. 10/12/23   Thapa, [place], MD  dapagliflozin propanediol (FARXIGA) 10 MG TABS tablet Take 1 tablet (10 mg total) by mouth daily. 11/28/23   Shamleffer, Konrad Dolores, MD  ezetimibe (ZETIA) 10 MG tablet Take 1 tablet (10 mg total) by mouth daily. 02/22/23   Reather Littler, MD  gabapentin (NEURONTIN) 300 MG capsule TAKE 1 CAPSULE(300 MG) BY MOUTH TWICE DAILY Patient not taking: Reported on 12/19/2023 10/21/20   Reather Littler, MD  glimepiride (AMARYL) 4 MG tablet Take 1 tablet (4 mg total) by mouth daily before breakfast. 10/12/23   Thapa, [place], MD  Glucagon (GVOKE HYPOPEN 2-PACK) 1 MG/0.2ML SOAJ Inject 1 Act into the skin daily as needed. 11/07/20   Etta Grandchild, MD  glucose blood test strip Use Accu Chek Aviva Test Strips as instructed to check blood sugar three times daily. 12/07/22   Reather Littler, MD  HYDROcodone-acetaminophen [facility]) 10-325 MG tablet One half to one tab every 8 hours as needed for spinal pain. Take with food. 10/03/23   Margaree Mackintosh, MD  Insulin Lispro Prot & Lispro (HUMALOG MIX 75/25 KWIKPEN) (75-25) 100 UNIT/ML Kwikpen Inject 36 Units into the skin in the morning and at bedtime. 10/12/23   Thapa, [place], MD  Insulin  Pen Needle (B-D UF III MINI PEN NEEDLES) 31G X 5 MM MISC Inject 1 Act into the skin 2 (two) times daily. ADMINISTER 30 UNITS EVERY EVENING 12/17/21   Etta Grandchild, MD  ketoconazole (NIZORAL) 2 % cream Apply 1 application. topically 2 (two) times daily. 12/17/21   Etta Grandchild, MD  losartan (COZAAR) 25 MG tablet Take 1 tablet (25 mg total) by mouth daily. Patient not taking: Reported on 12/19/2023 10/12/21   Reather Littler, MD  rivaroxaban (XARELTO) 10 MG TABS tablet TAKE 1 TABLET BY MOUTH DAILY 04/20/23   Etta Grandchild, MD  simvastatin (ZOCOR) 40 MG tablet TAKE 1 TABLET BY MOUTH AT  BEDTIME 04/20/23   Etta Grandchild, MD  solifenacin (VESICARE) 10 MG tablet Take 1 tablet (10 mg total) by mouth daily. 12/17/21   Etta Grandchild, MD  tamsulosin (FLOMAX) 0.4 MG CAPS capsule Take 0.4 mg by  mouth daily. 01/10/23   [provider]  traMADol (ULTRAM-ER) 100 MG 24 hr tablet Take 1 tablet (100 mg total) by mouth daily. Patient not taking: Reported on 12/19/2023 07/28/23   Etta Grandchild, MD     Family History  Problem Relation Age of Onset   Hypertension Mother    Diabetes Mother    Cancer Mother        ? location   Colon cancer Father 62   Colon polyps Neg Hx    Liver cancer Neg Hx     Social History   Socioeconomic History   Marital status: Widowed    Spouse name: Not on file   Number of children: 1   Years of education: 36   Highest education level: Never attended school  Occupational History   Occupation: retired    Comment: coal [employer]  Tobacco Use   Smoking status: Never   Smokeless tobacco: Never  Vaping Use   Vaping status: Never Used  Substance and Sexual Activity   Alcohol use: No    Alcohol/week: 0.0 standard drinks of alcohol    Comment: once every two months   Drug use: No   Sexual activity: Not Currently  Other Topics Concern   Not on file  Social History Narrative   Patient lives at home alone.. Patient is retired. Patient has high school education.   Right  handed.- Both   Caffeine- Coffee and soda   Social Drivers of Corporate investment banker Strain: Low Risk  (12/19/2023)   Overall Financial Resource Strain (CARDIA)    Difficulty of Paying Living Expenses: Not very hard  Recent Concern: Financial Resource Strain - Medium Risk (09/30/2023)   Overall Financial Resource Strain (CARDIA)    Difficulty of Paying Living Expenses: Somewhat hard  Food Insecurity: No Food Insecurity (12/19/2023)   Hunger Vital Sign    Worried About Running Out of Food in the Last Year: Never true    Ran Out of Food in the Last Year: Never true  Recent Concern: Food Insecurity - Food Insecurity Present (09/30/2023)   Hunger Vital Sign    Worried About Running Out of Food in the Last Year: Sometimes true    Ran Out of Food in the Last Year: Sometimes true  Transportation Needs: No Transportation Needs (12/19/2023)   PRAPARE - Administrator, Civil Service (Medical): No    Lack of Transportation (Non-Medical): No  Physical Activity: Inactive (12/19/2023)   Exercise Vital Sign    Days of Exercise per Week: 0 days    Minutes of Exercise per Session: 0 min  Stress: No Stress Concern Present (12/19/2023)   [employer] of Occupational Health - Occupational Stress Questionnaire    Feeling of Stress : Not at all  Social Connections: Moderately Integrated (12/19/2023)   Social Connection and Isolation Panel [NHANES]    Frequency of Communication with Friends and Family: Three times a week    Frequency of Social Gatherings with Friends and Family: Once a week    Attends Religious Services: More than 4 times per year    Active Member of [employer] or Organizations: Yes    Attends Banker Meetings: Never    Marital Status: Widowed     Vital Signs: There were no vitals taken for this visit.  Physical Exam Patient is alert, oriented and able to participate fully in the conversation. No apparent discomfort or distress observed. He appears  appropriately dressed.    Imaging:  PAE 12/13/23  Left PA pre embolization   Left PA post embolization   Right PA pre embolization   Right PA post embolization    Labs:  CBC: Recent Labs    07/28/23 1139 12/13/23 0853  WBC 6.4 5.9  HGB 13.7 15.2  HCT 42.7 45.2  PLT 322.0 265    COAGS: Recent Labs    12/13/23 0920  INR 1.1    BMP: Recent Labs    02/15/23 0925 04/25/23 1026 12/13/23 0920  NA 137 142 138  K 3.9 3.7 4.6  CL 100 107 103  CO2 26 24 22   GLUCOSE 330* 155* 229*  BUN 18 13 20   CALCIUM 9.5 9.2 9.0  CREATININE 1.15 0.97 1.13  GFRNONAA  --   --  >60    LIVER FUNCTION TESTS: Recent Labs    02/15/23 0925  BILITOT 0.6  AST 18  ALT 23  ALKPHOS 66  PROT 7.5  ALBUMIN 4.4    Assessment and Plan: 79 year old male with a history of benign prostatic hyperplasia (182 g) with severe lower urinary tract symptoms (IPSS-QoL 26-3 -> 19-2). He is now status post a technically successful bilateral prostate artery embolization on 12/13/23.  His urinary frequency has significantly improved and he reports a stronger stream.  Plan for MRI prostate in ~3 months with IR clinic follow up shortly thereafter.  Marliss Coots, MD Pager: [phone]    I spent a total of 40 Minutes in virtual video clinical consultation, greater than 50% of which was counseling/coordinating care for benign prostatic hyperplasia.

## 2024-01-06 NOTE — Progress Notes (Signed)
   01/06/2024 Name: Charles Parker MRN: [id number] DOB: December 11, 1945  Patient is appearing on a report for [employer] [place] [employer] Diabetes and last engaged with the clinical pharmacist to discuss diabetes on 11/01/2023. Contacted patient today to discuss diabetes management and completed medication review.   Diabetes Plan from last clinical pharmacist appointment:  - Currently uncontrolled, A1c <8% - Reviewed goal A1c, goal fasting, and goal 2 hour post prandial glucose - Reviewed dietary modifications including increasing fiber (veggies/fruits) and protein, balanced meals/snacks, reducing juice - Reviewed lifestyle modifications including: increased walking when able - Recommend to continue current regimen  - Recommend to check glucose at different times of day to see where BG may be running high. Possibly having high BG spikes after meals, driving N8G up.    Medication Adherence Barriers Identified:  Patient is checking blood sugars as prescribed: Yes He informs he usually checks blood sugar twice a day morning time and supper time. The readings he informed me of today were 147,156,172 ad 178. He was unable to speak about his medication regimen today as he was just getting up for the day.  Reviewed instructions for monitoring blood sugars at home and reminded patient to keep a written log to review with pharmacist Reminded patient of date/time of upcoming clinical pharmacist follow up and any upcoming PCP/specialists visits. Patient denies transportation barriers to the appointment. Yes Patient informed he would like for me to cancel his appointment. Informed patient that he would have to do this himself. He requested I speak to his brother about his appointment.Successful outreach to patient's brother Charles Parker. HIPAA verified. Charles Parker informs he will speak to his brother about his Endo appointment as he does not plan on cancelling it. He also verified his brother's appointment with Dr. Yetta Barre later this  month.  Pattricia Boss, CPhT [place]  Office: [phone] Fax: [phone] Email: [email or web address]

## 2024-01-09 ENCOUNTER — Ambulatory Visit
Admission: RE | Admit: 2024-01-09 | Discharge: 2024-01-09 | Disposition: A | Discharge status: Home / Self Care | Source: Ambulatory Visit | Attending: Interventional Radiology | Admitting: Interventional Radiology

## 2024-01-09 DIAGNOSIS — N401 Enlarged prostate with lower urinary tract symptoms: Secondary | ICD-10-CM

## 2024-01-09 DIAGNOSIS — R350 Frequency of micturition: Secondary | ICD-10-CM | POA: Diagnosis not present

## 2024-01-09 HISTORY — PX: IR RADIOLOGIST EVAL & MGMT: IMG5224

## 2024-01-10 ENCOUNTER — Ambulatory Visit (INDEPENDENT_AMBULATORY_CARE_PROVIDER_SITE_OTHER): Payer: 59 | Admitting: Endocrinology

## 2024-01-10 ENCOUNTER — Encounter: Payer: Self-pay | Admitting: Endocrinology

## 2024-01-10 VITALS — BP 132/80 | HR 75 | Ht 68.5 in | Wt 163.8 lb

## 2024-01-10 DIAGNOSIS — Z794 Long term (current) use of insulin: Secondary | ICD-10-CM | POA: Diagnosis not present

## 2024-01-10 DIAGNOSIS — E118 Type 2 diabetes mellitus with unspecified complications: Secondary | ICD-10-CM | POA: Diagnosis not present

## 2024-01-10 LAB — POCT GLYCOSYLATED HEMOGLOBIN (HGB A1C): Hemoglobin A1C: 10.7 % — AB (ref 4.0–5.6)

## 2024-01-10 LAB — POCT CBG (FASTING - GLUCOSE)-MANUAL ENTRY: Glucose Fasting, POC: 262 mg/dL — AB (ref 70–99)

## 2024-01-10 NOTE — Progress Notes (Signed)
 Outpatient Endocrinology Note Charles Zenora Karpel, MD  01/10/24  Patient's Name: Charles Parker    DOB: Jan 07, 1946    MRN: [id number]                                                    REASON OF VISIT: Follow up for type 2 diabetes mellitus  PCP: Etta Grandchild, MD  HISTORY OF PRESENT ILLNESS:   Charles Parker is a 78 y.o. old male with past medical history listed below, is here for follow up of type 2 diabetes mellitus.   Pertinent Diabetes History: Patient was diagnosed with type 2 diabetes mellitus in 2014.  He was on various antidiabetic medications since onset including metformin, Januvia and Amaryl.  He was also on Invokana for about a year in 2015.  His diabetes control has been variable with highest hemoglobin A1c was 13.6% in 2016.  He has mostly hemoglobin A1c in the range of 8.4 to 10.8%.  Chronic Diabetes Complications : Retinopathy: no. Last ophthalmology exam was done ? on annually, due.  Nephropathy: no, on losartan. Peripheral neuropathy: yes, tingling present, gabapentin.  Coronary artery disease: no Stroke: no  Relevant comorbidities and cardiovascular risk factors: Obesity: no Body mass index is 24.54 kg/m.  Hypertension: yes Hyperlipidemia. Yes, on a statin.  Current / Home Diabetic regimen includes: Farxiga 10 mg daily. NovoLog mix 75/25 : 36 units in the morning and 36 units in the evening.  Not fully compliant with the morning dose. Glimepiride 4mg  daily.   Prior diabetic medications: Metformin : Stopped due to hives /allergic reaction.  Glycemic data:   No glucose data to review.  He reports he has better problem with glucometer and has not been checking blood sugar lately at home.  He had tried Dexcom CGM in the past and had sensor falling issues.  He does not want to try sensor anymore.  Hypoglycemia: Patient has no hypoglycemic episodes. Patient has hypoglycemia awareness.  Factors modifying glucose control: 1.  Diabetic diet assessment: 3 meals  a day.  2.  Staying active or exercising: No formal exercise.  3.  Medication compliance: compliant most of the time.  Interval history Diabetes regimen as reviewed above.  He reports not fully compliant with the morning dose of NovoLog.  Hemoglobin A1c slightly improved to 10.7% today.  No glucose data to review.  He reports he has better problem with glucometer and has not been checking blood sugar lately at home.  He is accompanied by his brother in the clinic today.  He reports compliance with taking Farxiga and glimepiride.  He denies hypoglycemic symptoms.  REVIEW OF SYSTEMS As per history of present illness.   PAST MEDICAL HISTORY: Past Medical History:  Diagnosis Date   Clotting disorder (HCC)    DVT   Diabetes mellitus    type II   Diverticulosis    DVT (deep venous thrombosis) (HCC)    Glaucoma    no eye drops    Hepatic cyst    Hypercholesterolemia    Hypertension    Renal cyst    Sleep apnea    Tubulovillous adenoma    Urinary incontinence     PAST SURGICAL HISTORY: Past Surgical History:  Procedure Laterality Date   BLADDER NECK RECONSTRUCTION  02/03/2012   Procedure: BLADDER NECK REPAIR;  Surgeon: Viviann Spare C.  Gross, MD;  Location: [facility];  Service: General;;   COLONOSCOPY  02/2013   polyp removal(mult.)   IR ANGIOGRAM SELECTIVE EACH ADDITIONAL VESSEL  12/13/2023   IR ANGIOGRAM SELECTIVE EACH ADDITIONAL VESSEL  12/13/2023   IR ANGIOGRAM VISCERAL SELECTIVE  12/13/2023   IR EMBO ARTERIAL NOT HEMORR HEMANG INC GUIDE ROADMAPPING  12/13/2023   IR RADIOLOGIST EVAL & MGMT  11/28/2023   IR RADIOLOGIST EVAL & MGMT  01/09/2024   IR US GUIDE VASC ACCESS LEFT  12/13/2023   IR US GUIDE VASC ACCESS LEFT  12/13/2023   IR US GUIDE VASC ACCESS RIGHT  12/13/2023   KNEE SURGERY  2004   left   POLYPECTOMY     PROCTOSCOPY  02/03/2012   Procedure: PROCTOSCOPY;  Surgeon: Ardeth Sportsman, MD;  Location: [facility];  Service: General;;   STOMACH SURGERY  02/2012   TEE WITHOUT CARDIOVERSION N/A  10/05/2016   Procedure: TRANSESOPHAGEAL ECHOCARDIOGRAM (TEE);  Surgeon: Thurmon Fair, MD;  Location: [facility] ENDOSCOPY;  Service: Cardiovascular;  Laterality: N/A;    ALLERGIES: Allergies  Allergen Reactions   Metformin And Related Diarrhea   Codeine Rash    All over the body    FAMILY HISTORY:  Family History  Problem Relation Age of Onset   Hypertension Mother    Diabetes Mother    Cancer Mother        ? location   Colon cancer Father 1   Colon polyps Neg Hx    Liver cancer Neg Hx     SOCIAL HISTORY: Social History   Socioeconomic History   Marital status: Widowed    Spouse name: Not on file   Number of children: 1   Years of education: 33   Highest education level: Never attended school  Occupational History   Occupation: retired    Comment: coal mills  Tobacco Use   Smoking status: Never   Smokeless tobacco: Never  Vaping Use   Vaping status: Never Used  Substance and Sexual Activity   Alcohol use: No    Alcohol/week: 0.0 standard drinks of alcohol    Comment: once every two months   Drug use: No   Sexual activity: Not Currently  Other Topics Concern   Not on file  Social History Narrative   Patient lives at home alone.. Patient is retired. Patient has high school education.   Right handed.- Both   Caffeine- Coffee and soda   Social Drivers of Corporate investment banker Strain: Low Risk  (12/19/2023)   Overall Financial Resource Strain (CARDIA)    Difficulty of Paying Living Expenses: Not very hard  Recent Concern: Financial Resource Strain - Medium Risk (09/30/2023)   Overall Financial Resource Strain (CARDIA)    Difficulty of Paying Living Expenses: Somewhat hard  Food Insecurity: No Food Insecurity (12/19/2023)   Hunger Vital Sign    Worried About Running Out of Food in the Last Year: Never true    Ran Out of Food in the Last Year: Never true  Recent Concern: Food Insecurity - Food Insecurity Present (09/30/2023)   Hunger Vital Sign    Worried  About Running Out of Food in the Last Year: Sometimes true    Ran Out of Food in the Last Year: Sometimes true  Transportation Needs: No Transportation Needs (12/19/2023)   PRAPARE - Administrator, Civil Service (Medical): No    Lack of Transportation (Non-Medical): No  Physical Activity: Inactive (12/19/2023)   Exercise Vital Sign  Days of Exercise per Week: 0 days    Minutes of Exercise per Session: 0 min  Stress: No Stress Concern Present (12/19/2023)   [employer] of Occupational Health - Occupational Stress Questionnaire    Feeling of Stress : Not at all  Social Connections: Moderately Integrated (12/19/2023)   Social Connection and Isolation Panel [NHANES]    Frequency of Communication with Friends and Family: Three times a week    Frequency of Social Gatherings with Friends and Family: Once a week    Attends Religious Services: More than 4 times per year    Active Member of [employer] or Organizations: Yes    Attends Banker Meetings: Never    Marital Status: Widowed    MEDICATIONS:  Current Outpatient Medications  Medication Sig Dispense Refill   Alcohol Swabs PADS Use to clean area before insulin injections. DX E11.8 200 each 3   blood glucose meter kit and supplies KIT 1 each by Does not apply route daily as needed. Accu-Chek Aviva preferred by patient. Use up to four times daily as directed. (FOR ICD-9 250.00, 250.01).     Cholecalciferol 50 MCG (2000 UT) TABS Take 1 tablet (2,000 Units total) by mouth daily. 90 tablet 3   Continuous Blood Gluc Receiver (FREESTYLE LIBRE 2 READER) DEVI 1 Act by Does not apply route daily. 2 each 5   Continuous Blood Gluc Sensor (FREESTYLE LIBRE 2 SENSOR) MISC 1 Act by Does not apply route daily. 2 each 5   Continuous Glucose Receiver (FREESTYLE LIBRE 3 READER) DEVI Use with free stype libre 3+ 1 each 0   Continuous Glucose Sensor (FREESTYLE LIBRE 3 PLUS SENSOR) MISC 1 each by Does not apply route continuous. Change  every 15 days. 6 each 3   dapagliflozin propanediol (FARXIGA) 10 MG TABS tablet Take 1 tablet (10 mg total) by mouth daily. 90 tablet 3   ezetimibe (ZETIA) 10 MG tablet Take 1 tablet (10 mg total) by mouth daily. 90 tablet 3   gabapentin (NEURONTIN) 300 MG capsule TAKE 1 CAPSULE(300 MG) BY MOUTH TWICE DAILY 180 capsule 1   glimepiride (AMARYL) 4 MG tablet Take 1 tablet (4 mg total) by mouth daily before breakfast. 100 tablet 3   Glucagon (GVOKE HYPOPEN 2-PACK) 1 MG/0.2ML SOAJ Inject 1 Act into the skin daily as needed. 2 mL 5   glucose blood test strip Use Accu Chek Aviva Test Strips as instructed to check blood sugar three times daily. 100 each 3   HYDROcodone-acetaminophen (NORCO) 10-325 MG tablet One half to one tab every 8 hours as needed for spinal pain. Take with food. 15 tablet 0   Insulin Lispro Prot & Lispro (HUMALOG MIX 75/25 KWIKPEN) (75-25) 100 UNIT/ML Kwikpen Inject 36 Units into the skin in the morning and at bedtime. 66 mL 1   Insulin Pen Needle (B-D UF III MINI PEN NEEDLES) 31G X 5 MM MISC Inject 1 Act into the skin 2 (two) times daily. ADMINISTER 30 UNITS EVERY EVENING 100 each 2   ketoconazole (NIZORAL) 2 % cream Apply 1 application. topically 2 (two) times daily. 60 g 3   losartan (COZAAR) 25 MG tablet Take 1 tablet (25 mg total) by mouth daily. 90 tablet 1   rivaroxaban (XARELTO) 10 MG TABS tablet TAKE 1 TABLET BY MOUTH DAILY 100 tablet 0   simvastatin (ZOCOR) 40 MG tablet TAKE 1 TABLET BY MOUTH AT  BEDTIME 100 tablet 0   solifenacin (VESICARE) 10 MG tablet Take 1 tablet (10 mg  total) by mouth daily. 90 tablet 1   tamsulosin (FLOMAX) 0.4 MG CAPS capsule Take 0.4 mg by mouth daily.     traMADol (ULTRAM-ER) 100 MG 24 hr tablet Take 1 tablet (100 mg total) by mouth daily. 90 tablet 0   No current facility-administered medications for this visit.    PHYSICAL EXAM: Vitals:   01/10/24 1040  BP: 132/80  Pulse: 75  SpO2: 97%  Weight: 163 lb 12.8 oz (74.3 kg)  Height: 5' 8.5"  (1.74 m)      Body mass index is 24.54 kg/m.  Wt Readings from Last 3 Encounters:  01/10/24 163 lb 12.8 oz (74.3 kg)  12/19/23 170 lb (77.1 kg)  12/13/23 170 lb (77.1 kg)    General: Well developed, well nourished male in no apparent distress.  HEENT: AT/[place], no external lesions.  Eyes: Conjunctiva clear and no icterus. Neck: Neck supple  Lungs: Respirations not labored Neurologic: Alert, oriented, normal speech Extremities / Skin: Dry.   Psychiatric: Does not appear depressed or anxious  Diabetic Foot Exam - Simple   No data filed    LABS Reviewed Lab Results  Component Value Date   HGBA1C 10.7 (A) 01/10/2024   HGBA1C 11.3 (A) 10/12/2023   HGBA1C 9.5 (A) 05/31/2023   Lab Results  Component Value Date   FRUCTOSAMINE 381 (H) 04/25/2023   FRUCTOSAMINE 431 (H) 12/02/2022   FRUCTOSAMINE 369 (H) 02/23/2022   Lab Results  Component Value Date   CHOL 199 02/15/2023   HDL 46.20 02/15/2023   LDLCALC 122 (H) 02/15/2023   LDLDIRECT 104.0 04/25/2023   TRIG 154.0 (H) 02/15/2023   CHOLHDL 4 02/15/2023   Lab Results  Component Value Date   MICRALBCREAT 1.5 02/15/2023   MICRALBCREAT 0.6 02/23/2022   Lab Results  Component Value Date   CREATININE 1.13 12/13/2023   Lab Results  Component Value Date   GFR 75.49 04/25/2023    ASSESSMENT / PLAN  1. Type 2 diabetes mellitus with complication, with long-term current use of insulin (HCC)     Diabetes Mellitus type 2, complicated by peripheral neuropathy. - Diabetic status / severity: uncontrolled  Lab Results  Component Value Date   HGBA1C 10.7 (A) 01/10/2024    - Hemoglobin A1c goal : <7.5%  No glucose data to review no glucose data to review and not able to assess the diabetes regimen.  Diabetes control slightly improved to hemoglobin A1c 10.7% today.  - Medications:  I) continue NovoLog Mix 75/25: 36 units 2 times a day with breakfast and dinner. II) continue glimepiride 4 mg daily. III) continue Farxiga 10  mg daily.  - Home glucose testing: 2-3 times a day.  Discussed about continuous glucose monitoring.  He does not want to try sensor.  Advised to check blood sugar 2-3 times a day with glucometer.  He has glucometer and test supplies.   - Discussed/ Gave Hypoglycemia treatment plan.  # Consult : Diabetic educator.  # Annual urine for microalbuminuria/ creatinine ratio, no microalbuminuria currently, continue ACE/ARB /losartan. Last  Lab Results  Component Value Date   MICRALBCREAT 1.5 02/15/2023    # Foot check nightly / neuropathy, continue gabapentin.  # Annual dilated diabetic eye exams.  Advised to have diabetic eye exam.  - Diet: Eat reasonable portion sizes to promote a healthy weight - Life style / activity / exercise: Discussed/as tolerated.  2. Blood pressure  -  BP Readings from Last 1 Encounters:  01/10/24 132/80    - Control is  in target.  - No change in current plans.  3. Lipid status / Hyperlipidemia - Last  Lab Results  Component Value Date   LDLCALC 122 (H) 02/15/2023   - Continue simvastatin 40 mg daily.  Zetia 10 mg daily.  Managed by primary care provider.  Charles Harbor "A.D." was seen today for diabetes.  Diagnoses and all orders for this visit:  Type 2 diabetes mellitus with complication, with long-term current use of insulin (HCC) -     POCT glycosylated hemoglobin (Hb A1C) -     POCT CBG (Fasting - Glucose)     DISPOSITION Follow up in clinic in 3 months suggested.   All questions answered and patient verbalized understanding of the plan.  Charles Daiden Coltrane, MD [facility] Endocrinology [facility] [address] [place], [address] [place], [place] [address] Phone # [phone]  At least part of this note was generated using voice recognition software. Inadvertent word errors may have occurred, which were not recognized during the proofreading process.

## 2024-01-19 ENCOUNTER — Ambulatory Visit: Admitting: Internal Medicine

## 2024-01-19 DIAGNOSIS — H2511 Age-related nuclear cataract, right eye: Secondary | ICD-10-CM | POA: Diagnosis not present

## 2024-01-24 DIAGNOSIS — N401 Enlarged prostate with lower urinary tract symptoms: Secondary | ICD-10-CM | POA: Diagnosis not present

## 2024-01-24 DIAGNOSIS — N3941 Urge incontinence: Secondary | ICD-10-CM | POA: Diagnosis not present

## 2024-01-24 DIAGNOSIS — N138 Other obstructive and reflux uropathy: Secondary | ICD-10-CM | POA: Diagnosis not present

## 2024-01-25 LAB — HM DIABETES EYE EXAM

## 2024-02-06 ENCOUNTER — Telehealth: Payer: Self-pay

## 2024-02-06 NOTE — Telephone Encounter (Signed)
 Patient was identified as falling into the [place] Measure - Diabetes.   Patient was: Referred to Diabetes Management. Patient follows Endo

## 2024-02-09 ENCOUNTER — Encounter: Payer: Self-pay | Admitting: Internal Medicine

## 2024-02-09 ENCOUNTER — Ambulatory Visit (INDEPENDENT_AMBULATORY_CARE_PROVIDER_SITE_OTHER): Admitting: Internal Medicine

## 2024-02-09 VITALS — BP 124/82 | HR 77 | Temp 98.0°F | Ht 68.5 in | Wt 160.2 lb

## 2024-02-09 DIAGNOSIS — Z23 Encounter for immunization: Secondary | ICD-10-CM

## 2024-02-09 DIAGNOSIS — I10 Essential (primary) hypertension: Secondary | ICD-10-CM | POA: Diagnosis not present

## 2024-02-09 DIAGNOSIS — E1165 Type 2 diabetes mellitus with hyperglycemia: Secondary | ICD-10-CM | POA: Diagnosis not present

## 2024-02-09 DIAGNOSIS — Z794 Long term (current) use of insulin: Secondary | ICD-10-CM

## 2024-02-09 DIAGNOSIS — Z0000 Encounter for general adult medical examination without abnormal findings: Secondary | ICD-10-CM

## 2024-02-09 DIAGNOSIS — D6859 Other primary thrombophilia: Secondary | ICD-10-CM | POA: Diagnosis not present

## 2024-02-09 DIAGNOSIS — E559 Vitamin D deficiency, unspecified: Secondary | ICD-10-CM | POA: Diagnosis not present

## 2024-02-09 DIAGNOSIS — E785 Hyperlipidemia, unspecified: Secondary | ICD-10-CM | POA: Diagnosis not present

## 2024-02-09 DIAGNOSIS — Z0001 Encounter for general adult medical examination with abnormal findings: Secondary | ICD-10-CM

## 2024-02-09 DIAGNOSIS — R791 Abnormal coagulation profile: Secondary | ICD-10-CM

## 2024-02-09 DIAGNOSIS — E118 Type 2 diabetes mellitus with unspecified complications: Secondary | ICD-10-CM

## 2024-02-09 DIAGNOSIS — E119 Type 2 diabetes mellitus without complications: Secondary | ICD-10-CM

## 2024-02-09 LAB — LIPID PANEL
Cholesterol: 132 mg/dL (ref 0–200)
HDL: 51.4 mg/dL (ref >39.00)
LDL Cholesterol: 63 mg/dL (ref 0–99)
NonHDL: 80.65
Total CHOL/HDL Ratio: 3
Triglycerides: 86 mg/dL (ref 0.0–149.0)
VLDL: 17.2 mg/dL (ref 0.0–40.0)

## 2024-02-09 LAB — HEPATIC FUNCTION PANEL
ALT: 51 U/L (ref 0–53)
AST: 30 U/L (ref 0–37)
Albumin: 4.4 g/dL (ref 3.5–5.2)
Alkaline Phosphatase: 68 U/L (ref 39–117)
Bilirubin, Direct: 0.1 mg/dL (ref 0.0–0.3)
Total Bilirubin: 0.5 mg/dL (ref 0.2–1.2)
Total Protein: 7.9 g/dL (ref 6.0–8.3)

## 2024-02-09 LAB — URINALYSIS, ROUTINE W REFLEX MICROSCOPIC
Bilirubin Urine: NEGATIVE
Hgb urine dipstick: NEGATIVE
Ketones, ur: NEGATIVE
Leukocytes,Ua: NEGATIVE
Nitrite: NEGATIVE
RBC / HPF: NONE SEEN (ref 0–?)
Specific Gravity, Urine: <=1.005 — AB (ref 1.000–1.030)
Total Protein, Urine: NEGATIVE
Urine Glucose: >=1000 — AB
Urobilinogen, UA: 0.2 (ref 0.0–1.0)
pH: 6 (ref 5.0–8.0)

## 2024-02-09 LAB — MICROALBUMIN / CREATININE URINE RATIO
Creatinine,U: 41.7 mg/dL
Microalb Creat Ratio: UNDETERMINED mg/g (ref 0.0–30.0)
Microalb, Ur: <0.7 mg/dL

## 2024-02-09 LAB — VITAMIN D 25 HYDROXY (VIT D DEFICIENCY, FRACTURES): VITD: 28.88 ng/mL — ABNORMAL LOW (ref 30.00–100.00)

## 2024-02-09 LAB — TSH: TSH: 2.73 u[IU]/mL (ref 0.35–5.50)

## 2024-02-09 MED ORDER — BOOSTRIX 5-2.5-18.5 LF-MCG/0.5 IM SUSP: 0.5000 mL | Freq: Once | INTRAMUSCULAR | 0 refills | Status: AC

## 2024-02-09 MED ORDER — SHINGRIX 50 MCG/0.5ML IM SUSR: 0.5000 mL | Freq: Once | INTRAMUSCULAR | 1 refills | Status: AC

## 2024-02-09 NOTE — Progress Notes (Signed)
 Subjective:  Patient ID: Charles Parker, male    DOB: Jul 21, 1946  Age: 78 y.o. MRN: [id number]  CC: Annual Exam, Diabetes, Hypertension, and Hyperlipidemia   HPI Charles Parker presents for a CPX and f/up -----  Discussed the use of AI scribe software for clinical note transcription with the patient, who gave verbal consent to proceed.  History of Present Illness   [name] "A.D." is a 78 year old male who presents for a follow-up visit after recent cataract surgery and prostate procedure.  He underwent cataract surgery on his right eye approximately two weeks ago. The procedure was successful, and he has not experienced any complications or issues following the surgery.  He also had a procedure for a large prostate, which was successful. He continues to follow up with his urologist for this condition.  No chest pain, shortness of breath, dizziness, lightheadedness, trouble with blood pressure, blood sugar issues, or urinary problems.       Outpatient Medications Prior to Visit  Medication Sig Dispense Refill   Alcohol  Swabs  PADS Use to clean area before insulin  injections. DX E11.8 200 each 3   blood glucose meter kit and supplies KIT 1 each by Does not apply route daily as needed. Accu-Chek Aviva preferred by patient. Use up to four times daily as directed. (FOR ICD-9 250.00, 250.01).     Continuous Blood Gluc Receiver (FREESTYLE LIBRE 2 READER) DEVI 1 Act by Does not apply route daily. 2 each 5   Continuous Blood Gluc Sensor (FREESTYLE LIBRE 2 SENSOR) MISC 1 Act by Does not apply route daily. 2 each 5   Continuous Glucose Receiver (FREESTYLE LIBRE 3 READER) DEVI Use with free stype libre 3+ 1 each 0   Continuous Glucose Sensor (FREESTYLE LIBRE 3 PLUS SENSOR) MISC 1 each by Does not apply route continuous. Change every 15 days. 6 each 3   dapagliflozin  propanediol (FARXIGA ) 10 MG TABS tablet Take 1 tablet (10 mg total) by mouth daily. 90 tablet 3   ezetimibe   (ZETIA ) 10 MG tablet Take 1 tablet (10 mg total) by mouth daily. 90 tablet 3   gabapentin  (NEURONTIN ) 300 MG capsule TAKE 1 CAPSULE(300 MG) BY MOUTH TWICE DAILY 180 capsule 1   glimepiride  (AMARYL ) 4 MG tablet Take 1 tablet (4 mg total) by mouth daily before breakfast. 100 tablet 3   glucose blood test strip Use Accu Chek Aviva Test Strips as instructed to check blood sugar three times daily. 100 each 3   Insulin  Lispro Prot & Lispro (HUMALOG  MIX 75/25 KWIKPEN) (75-25) 100 UNIT/ML Kwikpen Inject 36 Units into the skin in the morning and at bedtime. 66 mL 1   ketoconazole  (NIZORAL ) 2 % cream Apply 1 application. topically 2 (two) times daily. 60 g 3   losartan  (COZAAR ) 25 MG tablet Take 1 tablet (25 mg total) by mouth daily. 90 tablet 1   solifenacin  (VESICARE ) 10 MG tablet Take 1 tablet (10 mg total) by mouth daily. 90 tablet 1   tamsulosin  (FLOMAX ) 0.4 MG CAPS capsule Take 0.4 mg by mouth daily.     traMADol  (ULTRAM -ER) 100 MG 24 hr tablet Take 1 tablet (100 mg total) by mouth daily. 90 tablet 0   Cholecalciferol  50 MCG (2000 UT) TABS Take 1 tablet (2,000 Units total) by mouth daily. 90 tablet 3   Glucagon  (GVOKE HYPOPEN  2-PACK) 1 MG/0.2ML SOAJ Inject 1 Act into the skin daily as needed. 2 mL 5   HYDROcodone -acetaminophen  (NORCO) 10-325  MG tablet One half to one tab every 8 hours as needed for spinal pain. Take with food. 15 tablet 0   Insulin  Pen Needle (B-D UF III MINI PEN NEEDLES) 31G X 5 MM MISC Inject 1 Act into the skin 2 (two) times daily. ADMINISTER 30 UNITS EVERY EVENING 100 each 2   rivaroxaban  (XARELTO ) 10 MG TABS tablet TAKE 1 TABLET BY MOUTH DAILY 100 tablet 0   simvastatin  (ZOCOR ) 40 MG tablet TAKE 1 TABLET BY MOUTH AT  BEDTIME 100 tablet 0   No facility-administered medications prior to visit.    ROS Review of Systems  Constitutional: Negative.  Negative for appetite change, chills, diaphoresis and fatigue.  HENT: Negative.    Eyes:  Negative for visual disturbance.   Respiratory: Negative.  Negative for cough, chest tightness, shortness of breath and wheezing.   Cardiovascular:  Negative for chest pain, palpitations and leg swelling.  Gastrointestinal: Negative.  Negative for abdominal pain, constipation, diarrhea, nausea and vomiting.  Genitourinary: Negative.  Negative for difficulty urinating and dysuria.  Musculoskeletal:  Negative for arthralgias and myalgias.  Neurological: Negative.  Negative for dizziness, weakness and light-headedness.  Hematological:  Negative for adenopathy. Does not bruise/bleed easily.  Psychiatric/Behavioral:  Negative for confusion and decreased concentration.     Objective:  BP 124/82 (BP Location: Left Arm, Patient Position: Sitting, Cuff Size: Normal)   Pulse 77   Temp 98 F (36.7 C) (Oral)   Ht 5' 8.5" (1.74 m)   Wt 160 lb 3.2 oz (72.7 kg)   SpO2 94%   BMI 24.00 kg/m   BP Readings from Last 3 Encounters:  02/09/24 124/82  01/10/24 132/80  12/13/23 (!) 131/94    Wt Readings from Last 3 Encounters:  02/09/24 160 lb 3.2 oz (72.7 kg)  01/10/24 163 lb 12.8 oz (74.3 kg)  12/19/23 170 lb (77.1 kg)    Physical Exam Vitals reviewed.  Constitutional:      Appearance: Normal appearance.  HENT:     Mouth/Throat:     Mouth: Mucous membranes are moist.  Eyes:     General: No scleral icterus.    Conjunctiva/sclera: Conjunctivae normal.  Cardiovascular:     Rate and Rhythm: Normal rate and regular rhythm.     Heart sounds: No murmur heard.    Comments: EKG-- NSR, 70 bpm Minimal LVH No Q waves or ST/T wave changes Unchanged  Pulmonary:     Effort: Pulmonary effort is normal.     Breath sounds: No stridor. No wheezing, rhonchi or rales.  Abdominal:     General: Abdomen is flat.     Palpations: There is no mass.     Tenderness: There is no abdominal tenderness. There is no guarding.     Hernia: No hernia is present.  Musculoskeletal:     Cervical back: Neck supple.     Right lower leg: No edema.      Left lower leg: No edema.  Lymphadenopathy:     Cervical: No cervical adenopathy.  Skin:    General: Skin is warm and dry.  Neurological:     General: No focal deficit present.     Mental Status: He is alert.  Psychiatric:        Mood and Affect: Mood normal.        Behavior: Behavior normal.     Lab Results  Component Value Date   WBC 5.9 12/13/2023   HGB 15.2 12/13/2023   HCT 45.2 12/13/2023   PLT 265 12/13/2023  GLUCOSE 229 (H) 12/13/2023   CHOL 132 02/09/2024   TRIG 86.0 02/09/2024   HDL 51.40 02/09/2024   LDLDIRECT 104.0 04/25/2023   LDLCALC 63 02/09/2024   ALT 51 02/09/2024   AST 30 02/09/2024   NA 138 12/13/2023   K 4.6 12/13/2023   CL 103 12/13/2023   CREATININE 1.13 12/13/2023   BUN 20 12/13/2023   CO2 22 12/13/2023   TSH 2.73 02/09/2024   PSA 2.22 09/21/2022   INR 1.1 12/13/2023   HGBA1C 10.7 (A) 01/10/2024   MICROALBUR <0.7 02/09/2024    IR Radiologist Eval & Mgmt Result Date: 01/09/2024 EXAM: ESTABLISHED PATIENT OFFICE VISIT CHIEF COMPLAINT: See Epic note. HISTORY OF PRESENT ILLNESS: See Epic note. REVIEW OF SYSTEMS: See Epic note. PHYSICAL EXAMINATION: See Epic note. ASSESSMENT AND PLAN: See Epic note. Creasie Doctor, MD [facility] Radiology Electronically Signed   By: Creasie Doctor M.D.   On: 01/09/2024 09:58    Assessment & Plan:  Uncontrolled type 2 diabetes mellitus with hyperglycemia, with long-term current use of insulin  (HCC) -     HM Diabetes Foot Exam -     Urinalysis, Routine w reflex microscopic; Future -     Microalbumin / creatinine urine ratio; Future -     BD Pen Needle Mini U/F; Inject 1 Act into the skin 2 (two) times daily. ADMINISTER 30 UNITS EVERY EVENING  Dispense: 100 each; Refill: 2 -     Gvoke HypoPen  2-Pack; Inject 1 Act into the skin daily as needed.  Dispense: 2 mL; Refill: 5  Encounter for general adult medical examination with abnormal findings- Exam completed, labs  reviewed, vaccines reviewed and updated, no cancer screenings indicated, pt ed material was given.   Vitamin D  insufficiency -     VITAMIN D  25 Hydroxy (Vit-D Deficiency, Fractures); Future -     Cholecalciferol ; Take 1 tablet (2,000 Units total) by mouth daily.  Dispense: 90 tablet; Refill: 1  Hyperlipidemia with target LDL less than 70-LDL goal achieved. Doing well on the statin  -     Lipid panel; Future -     TSH; Future -     Hepatic function panel; Future -     Simvastatin ; Take 1 tablet (40 mg total) by mouth at bedtime.  Dispense: 100 tablet; Refill: 0  Essential hypertension- BP is well controlled. EKG is unchanged. -     TSH; Future -     Hepatic function panel; Future -     Urinalysis, Routine w reflex microscopic; Future -     EKG 12-Lead  Need for prophylactic vaccination with combined diphtheria-tetanus-pertussis (DTP) vaccine -     Boostrix ; Inject 0.5 mLs into the muscle once for 1 dose.  Dispense: 0.5 mL; Refill: 0  Need for prophylactic vaccination and inoculation against varicella -     Shingrix ; Inject 0.5 mLs into the muscle once for 1 dose.  Dispense: 0.5 mL; Refill: 1  Elevated factor VIII level- Will restart the DOAC.  Insulin -requiring or dependent type II diabetes mellitus (HCC) -     BD Pen Needle Mini U/F; Inject 1 Act into the skin 2 (two) times daily. ADMINISTER 30 UNITS EVERY EVENING  Dispense: 100 each; Refill: 2  Type 2 diabetes mellitus with complication, with long-term current use of insulin  (HCC) -     Gvoke HypoPen  2-Pack; Inject 1 Act into the skin daily as needed.  Dispense: 2 mL; Refill: 5  Hypercoagulable state, primary (HCC) -  Rivaroxaban ; Take 1 tablet (10 mg total) by mouth daily.  Dispense: 100 tablet; Refill: 0     Follow-up: Return in about 6 months (around 08/11/2024).  Sandra Crouch, MD

## 2024-02-09 NOTE — Patient Instructions (Signed)
 Health Maintenance, Male  Adopting a healthy lifestyle and getting preventive care are important in promoting health and wellness. Ask your health care provider about:  The right schedule for you to have regular tests and exams.  Things you can do on your own to prevent diseases and keep yourself healthy.  What should I know about diet, weight, and exercise?  Eat a healthy diet    Eat a diet that includes plenty of vegetables, fruits, low-fat dairy products, and lean protein.  Do not eat a lot of foods that are high in solid fats, added sugars, or sodium.  Maintain a healthy weight  Body mass index (BMI) is a measurement that can be used to identify possible weight problems. It estimates body fat based on height and weight. Your health care provider can help determine your BMI and help you achieve or maintain a healthy weight.  Get regular exercise  Get regular exercise. This is one of the most important things you can do for your health. Most adults should:  Exercise for at least 150 minutes each week. The exercise should increase your heart rate and make you sweat (moderate-intensity exercise).  Do strengthening exercises at least twice a week. This is in addition to the moderate-intensity exercise.  Spend less time sitting. Even light physical activity can be beneficial.  Watch cholesterol and blood lipids  Have your blood tested for lipids and cholesterol at 78 years of age, then have this test every 5 years.  You may need to have your cholesterol levels checked more often if:  Your lipid or cholesterol levels are high.  You are older than 78 years of age.  You are at high risk for heart disease.  What should I know about cancer screening?  Many types of cancers can be detected early and may often be prevented. Depending on your health history and family history, you may need to have cancer screening at various ages. This may include screening for:  Colorectal cancer.  Prostate cancer.  Skin cancer.  Lung  cancer.  What should I know about heart disease, diabetes, and high blood pressure?  Blood pressure and heart disease  High blood pressure causes heart disease and increases the risk of stroke. This is more likely to develop in people who have high blood pressure readings or are overweight.  Talk with your health care provider about your target blood pressure readings.  Have your blood pressure checked:  Every 3-5 years if you are 9-95 years of age.  Every year if you are 85 years old or older.  If you are between the ages of 29 and 29 and are a current or former smoker, ask your health care provider if you should have a one-time screening for abdominal aortic aneurysm (AAA).  Diabetes  Have regular diabetes screenings. This checks your fasting blood sugar level. Have the screening done:  Once every three years after age 23 if you are at a normal weight and have a low risk for diabetes.  More often and at a younger age if you are overweight or have a high risk for diabetes.  What should I know about preventing infection?  Hepatitis B  If you have a higher risk for hepatitis B, you should be screened for this virus. Talk with your health care provider to find out if you are at risk for hepatitis B infection.  Hepatitis C  Blood testing is recommended for:  Everyone born from [birth year] through 1965.  Anyone  with known risk factors for hepatitis C.  Sexually transmitted infections (STIs)  You should be screened each year for STIs, including gonorrhea and chlamydia, if:  You are sexually active and are younger than 78 years of age.  You are older than 78 years of age and your health care provider tells you that you are at risk for this type of infection.  Your sexual activity has changed since you were last screened, and you are at increased risk for chlamydia or gonorrhea. Ask your health care provider if you are at risk.  Ask your health care provider about whether you are at high risk for HIV. Your health care provider  may recommend a prescription medicine to help prevent HIV infection. If you choose to take medicine to prevent HIV, you should first get tested for HIV. You should then be tested every 3 months for as long as you are taking the medicine.  Follow these instructions at home:  Alcohol use  Do not drink alcohol if your health care provider tells you not to drink.  If you drink alcohol:  Limit how much you have to 0-2 drinks a day.  Know how much alcohol is in your drink. In the [place] one drink equals one 12 oz bottle of beer (355 mL), one 5 oz glass of wine (148 mL), or one 1 oz glass of hard liquor (44 mL).  Lifestyle  Do not use any products that contain nicotine or tobacco. These products include cigarettes, chewing tobacco, and vaping devices, such as e-cigarettes. If you need help quitting, ask your health care provider.  Do not use street drugs.  Do not share needles.  Ask your health care provider for help if you need support or information about quitting drugs.  General instructions  Schedule regular health, dental, and eye exams.  Stay current with your vaccines.  Tell your health care provider if:  You often feel depressed.  You have ever been abused or do not feel safe at home.  Summary  Adopting a healthy lifestyle and getting preventive care are important in promoting health and wellness.  Follow your health care provider's instructions about healthy diet, exercising, and getting tested or screened for diseases.  Follow your health care provider's instructions on monitoring your cholesterol and blood pressure.  This information is not intended to replace advice given to you by your health care provider. Make sure you discuss any questions you have with your health care provider.  Document Revised: 02/09/2021 Document Reviewed: 02/09/2021  Elsevier Patient Education  2024 [employer].

## 2024-02-11 MED ORDER — BD PEN NEEDLE MINI U/F 31G X 5 MM MISC: 1.0000 | Freq: Two times a day (BID) | 2 refills | Status: AC

## 2024-02-11 MED ORDER — RIVAROXABAN 10 MG PO TABS: 10.0000 mg | ORAL_TABLET | Freq: Every day | ORAL | 0 refills | Status: AC

## 2024-02-11 MED ORDER — GVOKE HYPOPEN 2-PACK 1 MG/0.2ML ~~LOC~~ SOAJ: 1.0000 | Freq: Every day | SUBCUTANEOUS | 5 refills | Status: AC | PRN

## 2024-02-11 MED ORDER — SIMVASTATIN 40 MG PO TABS: 40.0000 mg | ORAL_TABLET | Freq: Every day | ORAL | 0 refills | Status: DC

## 2024-02-11 MED ORDER — CHOLECALCIFEROL 50 MCG (2000 UT) PO TABS: 1.0000 | ORAL_TABLET | Freq: Every day | ORAL | 1 refills | Status: AC

## 2024-02-14 ENCOUNTER — Other Ambulatory Visit (HOSPITAL_COMMUNITY): Payer: Self-pay

## 2024-02-14 ENCOUNTER — Ambulatory Visit (INDEPENDENT_AMBULATORY_CARE_PROVIDER_SITE_OTHER): Payer: No Typology Code available for payment source | Admitting: Podiatry

## 2024-02-14 ENCOUNTER — Encounter: Payer: Self-pay | Admitting: Podiatry

## 2024-02-14 DIAGNOSIS — M79674 Pain in right toe(s): Secondary | ICD-10-CM | POA: Diagnosis not present

## 2024-02-14 DIAGNOSIS — M79675 Pain in left toe(s): Secondary | ICD-10-CM | POA: Diagnosis not present

## 2024-02-14 DIAGNOSIS — B351 Tinea unguium: Secondary | ICD-10-CM | POA: Diagnosis not present

## 2024-02-14 DIAGNOSIS — E1151 Type 2 diabetes mellitus with diabetic peripheral angiopathy without gangrene: Secondary | ICD-10-CM

## 2024-02-14 DIAGNOSIS — E1140 Type 2 diabetes mellitus with diabetic neuropathy, unspecified: Secondary | ICD-10-CM | POA: Diagnosis not present

## 2024-02-14 NOTE — Progress Notes (Signed)
 This patient returns to my office for at risk foot care.  This patient requires this care by a professional since this patient will be at risk due to having diabetes type 2 and coagulation defect.  Patient is taking xarelto.  This patient is unable to cut nails himself since the patient cannot reach his nails.These nails are painful walking and wearing shoes.  This patient presents for at risk foot care today.  General Appearance  Alert, conversant and in no acute stress.  Vascular  Dorsalis pedis and posterior tibial  pulses are weakly palpable  bilaterally.  Capillary return is within normal limits  bilaterally. Temperature is within normal limits  bilaterally.  Neurologic  Senn-Weinstein monofilament wire test within normal limits  bilaterally. Muscle power within normal limits bilaterally.  Nails Thick disfigured discolored nails with subungual debris  from hallux to fifth toes bilaterally.  Orthopedic  No limitations of motion  feet .  No crepitus or effusions noted.  HAV with overlapping second digit  B/L.  Skin  normotropic skin with no porokeratosis noted bilaterally.  No signs of infections or ulcers noted.     Onychomycosis  Pain in right toes  Pain in left toes  Consent was obtained for treatment procedures.   Mechanical debridement of nails 1-5  bilaterally performed with a nail nipper.  Filed with dremel without incident.     Return office visit     3 months                Told patient to return for periodic foot care and evaluation due to potential at risk complications.   Helane Gunther DPM

## 2024-02-20 ENCOUNTER — Telehealth: Payer: Self-pay | Admitting: *Deleted

## 2024-02-20 NOTE — Progress Notes (Signed)
 Care Guide Pharmacy Note  02/20/2024 Name: Charles Parker MRN: [id number] DOB: Nov 23, 1945  Referred By: Arcadio Knuckles, MD Reason for referral: Complex Care Management and Call Attempt #1 (Outreach to schedule referral with pharmacist )   Charles Parker is a 78 y.o. year old male who is a primary care patient of Arcadio Knuckles, MD.  Charles Parker was referred to the pharmacist for assistance related to: DMII  An unsuccessful telephone outreach was attempted today to contact the patient who was referred to the pharmacy team for assistance with medication management. Additional attempts will be made to contact the patient.  Kandis Ormond, CMA [place]  [facility], [facility] Guide Direct Dial: [phone]  Fax: [phone] Website: [email or web address]

## 2024-02-22 NOTE — Progress Notes (Signed)
 Care Guide Pharmacy Note  02/22/2024 Name: Charles Parker MRN: [id number] DOB: August 11, 1946  Referred By: Arcadio Knuckles, MD Reason for referral: Complex Care Management and Call Attempt #1 (Outreach to schedule referral with pharmacist )   Charles Parker is a 78 y.o. year old male who is a primary care patient of Arcadio Knuckles, MD.  Charles Parker was referred to the pharmacist for assistance related to: DMII  A second unsuccessful telephone outreach was attempted today to contact the patient who was referred to the pharmacy team for assistance with medication management. Additional attempts will be made to contact the patient.  Kandis Ormond, CMA [place]  [facility], [facility] Guide Direct Dial: [phone]  Fax: [phone] Website: [email or web address]

## 2024-02-23 NOTE — Progress Notes (Signed)
 Care Guide Pharmacy Note  02/23/2024 Name: Charles Parker MRN: [id number] DOB: Apr 02, 1946  Referred By: Arcadio Knuckles, MD Reason for referral: Complex Care Management and Call Attempt #1 (Outreach to schedule referral with pharmacist )   [name] is a 78 y.o. year old male who is a primary care patient of Arcadio Knuckles, MD.  Derril Flint was referred to the pharmacist for assistance related to: DMII  A third unsuccessful telephone outreach was attempted today to contact the patient who was referred to the pharmacy team for assistance with medication management. The Population Health team is pleased to engage with this patient at any time in the future upon receipt of referral and should he/she be interested in assistance from the Population Health team.  Kandis Ormond, CMA [facility]  [facility], [facility] Guide Direct Dial: [phone]  Fax: [phone] Website: [email or web address]

## 2024-02-29 DIAGNOSIS — N401 Enlarged prostate with lower urinary tract symptoms: Secondary | ICD-10-CM | POA: Diagnosis not present

## 2024-02-29 DIAGNOSIS — R3915 Urgency of urination: Secondary | ICD-10-CM | POA: Diagnosis not present

## 2024-03-21 ENCOUNTER — Encounter: Payer: Self-pay | Admitting: Endocrinology

## 2024-03-21 ENCOUNTER — Ambulatory Visit (INDEPENDENT_AMBULATORY_CARE_PROVIDER_SITE_OTHER): Admitting: Endocrinology

## 2024-03-21 DIAGNOSIS — E1165 Type 2 diabetes mellitus with hyperglycemia: Secondary | ICD-10-CM

## 2024-03-21 DIAGNOSIS — E119 Type 2 diabetes mellitus without complications: Secondary | ICD-10-CM

## 2024-03-21 DIAGNOSIS — Z794 Long term (current) use of insulin: Secondary | ICD-10-CM

## 2024-03-21 MED ORDER — INSULIN LISPRO PROT & LISPRO (75-25 MIX) 100 UNIT/ML KWIKPEN
36.0000 [IU] | PEN_INJECTOR | Freq: Two times a day (BID) | SUBCUTANEOUS | 4 refills | Status: AC
Start: 2024-03-21 — End: ?

## 2024-03-21 NOTE — Patient Instructions (Signed)
 Bring meter in follow up visit.

## 2024-03-21 NOTE — Progress Notes (Signed)
 Outpatient Endocrinology Note Charles Chipper Koudelka, MD  03/21/24  Patient's Name: Charles Parker    DOB: 1946/03/14    MRN: [id number]                                                    REASON OF VISIT: Follow up for type 2 diabetes mellitus  PCP: Arcadio Knuckles, MD  HISTORY OF PRESENT ILLNESS:   Charles Parker is a 78 y.o. old male with past medical history listed below, is here for follow up of type 2 diabetes mellitus.   Pertinent Diabetes History: Patient was diagnosed with type 2 diabetes mellitus in 2014.  He was on various antidiabetic medications since onset including metformin, Januvia  and Amaryl .  He was also on Invokana  for about a year in 2015.  His diabetes control has been variable with highest hemoglobin A1c was 13.6% in 2016.  He has mostly hemoglobin A1c in the range of 8.4 to 10.8%.  Chronic Diabetes Complications : Retinopathy: no. Last ophthalmology exam was done ? on annually, due.  Nephropathy: no, on losartan . Peripheral neuropathy: yes, tingling present, gabapentin .  Coronary artery disease: no Stroke: no  Relevant comorbidities and cardiovascular risk factors: Obesity: no Body mass index is 23.13 kg/m.  Hypertension: yes Hyperlipidemia. Yes, on a statin.  Current / Home Diabetic regimen includes: Farxiga  10 mg daily. NovoLog  mix 75/25 : 36 units in the morning and 36 units in the evening.  Not fully compliant with the morning dose. Glimepiride  4mg  daily.   Prior diabetic medications: Metformin : Stopped due to hives /allergic reaction.  Glycemic data:   No glucose data to review.  He reports he is checking blood sugar, forget to bring in the clinic today.  He recalls blood sugar normal 136, 150 in the last few days.    He had tried Dexcom CGM in the past and had sensor falling issues.  He does not want to try sensor anymore.  Hypoglycemia: Patient has no hypoglycemic episodes. Patient has hypoglycemia awareness.  Factors modifying glucose  control: 1.  Diabetic diet assessment: 3 meals a day.  2.  Staying active or exercising: No formal exercise.  3.  Medication compliance: compliant most of the time.  Interval history Diabetes regimen as reviewed and noted above.  He reports compliance with insulin  and other medications.  No glucometer data to review.  He had improvement mildly diabetes control in last visit.  Denies hypoglycemic symptoms.  No other complaints today.  REVIEW OF SYSTEMS As per history of present illness.   PAST MEDICAL HISTORY: Past Medical History:  Diagnosis Date   Clotting disorder (HCC)    DVT   Diabetes mellitus    type II   Diverticulosis    DVT (deep venous thrombosis) (HCC)    Glaucoma    no eye drops    Hepatic cyst    Hypercholesterolemia    Hypertension    Renal cyst    Sleep apnea    Tubulovillous adenoma    Urinary incontinence     PAST SURGICAL HISTORY: Past Surgical History:  Procedure Laterality Date   BLADDER NECK RECONSTRUCTION  02/03/2012   Procedure: BLADDER NECK REPAIR;  Surgeon: Eddye Goodie, MD;  Location: [facility] ORS;  Service: General;;   COLONOSCOPY  02/2013   polyp removal(mult.)   IR ANGIOGRAM SELECTIVE  EACH ADDITIONAL VESSEL  12/13/2023   IR ANGIOGRAM SELECTIVE EACH ADDITIONAL VESSEL  12/13/2023   IR ANGIOGRAM VISCERAL SELECTIVE  12/13/2023   IR EMBO ARTERIAL NOT HEMORR HEMANG INC GUIDE ROADMAPPING  12/13/2023   IR RADIOLOGIST EVAL & MGMT  11/28/2023   IR RADIOLOGIST EVAL & MGMT  01/09/2024   IR US  GUIDE VASC ACCESS LEFT  12/13/2023   IR US  GUIDE VASC ACCESS LEFT  12/13/2023   IR US  GUIDE VASC ACCESS RIGHT  12/13/2023   KNEE SURGERY  2004   left   POLYPECTOMY     PROCTOSCOPY  02/03/2012   Procedure: PROCTOSCOPY;  Surgeon: Eddye Goodie, MD;  Location: [facility];  Service: General;;   STOMACH SURGERY  02/2012   TEE WITHOUT CARDIOVERSION N/A 10/05/2016   Procedure: TRANSESOPHAGEAL ECHOCARDIOGRAM (TEE);  Surgeon: Luana Rumple, MD;  Location: [facility] ENDOSCOPY;  Service:  Cardiovascular;  Laterality: N/A;    ALLERGIES: Allergies  Allergen Reactions   Metformin And Related Diarrhea   Codeine Rash    All over the body    FAMILY HISTORY:  Family History  Problem Relation Age of Onset   Hypertension Mother    Diabetes Mother    Cancer Mother        ? location   Colon cancer Father 73   Colon polyps Neg Hx    Liver cancer Neg Hx     SOCIAL HISTORY: Social History   Socioeconomic History   Marital status: Widowed    Spouse name: Not on file   Number of children: 1   Years of education: 79   Highest education level: Never attended school  Occupational History   Occupation: retired    Comment: coal mills  Tobacco Use   Smoking status: Never   Smokeless tobacco: Never  Vaping Use   Vaping status: Never Used  Substance and Sexual Activity   Alcohol  use: No    Alcohol /week: 0.0 standard drinks of alcohol     Comment: once every two months   Drug use: No   Sexual activity: Not Currently  Other Topics Concern   Not on file  Social History Narrative   Patient lives at home alone.. Patient is retired. Patient has high school education.   Right handed.- Both   Caffeine- Coffee and soda   Social Drivers of Corporate investment banker Strain: Low Risk  (12/19/2023)   Overall Financial Resource Strain (CARDIA)    Difficulty of Paying Living Expenses: Not very hard  Recent Concern: Financial Resource Strain - Medium Risk (09/30/2023)   Overall Financial Resource Strain (CARDIA)    Difficulty of Paying Living Expenses: Somewhat hard  Food Insecurity: No Food Insecurity (12/19/2023)   Hunger Vital Sign    Worried About Running Out of Food in the Last Year: Never true    Ran Out of Food in the Last Year: Never true  Recent Concern: Food Insecurity - Food Insecurity Present (09/30/2023)   Hunger Vital Sign    Worried About Running Out of Food in the Last Year: Sometimes true    Ran Out of Food in the Last Year: Sometimes true  Transportation  Needs: No Transportation Needs (12/19/2023)   PRAPARE - Administrator, Civil Service (Medical): No    Lack of Transportation (Non-Medical): No  Physical Activity: Inactive (12/19/2023)   Exercise Vital Sign    Days of Exercise per Week: 0 days    Minutes of Exercise per Session: 0 min  Stress: No Stress Concern  Present (12/19/2023)   [employer] of Occupational Health - Occupational Stress Questionnaire    Feeling of Stress : Not at all  Social Connections: Moderately Integrated (12/19/2023)   Social Connection and Isolation Panel    Frequency of Communication with Friends and Family: Three times a week    Frequency of Social Gatherings with Friends and Family: Once a week    Attends Religious Services: More than 4 times per year    Active Member of [employer] or Organizations: Yes    Attends Banker Meetings: Never    Marital Status: Widowed    MEDICATIONS:  Current Outpatient Medications  Medication Sig Dispense Refill   Alcohol  Swabs  PADS Use to clean area before insulin  injections. DX E11.8 200 each 3   blood glucose meter kit and supplies KIT 1 each by Does not apply route daily as needed. Accu-Chek Aviva preferred by patient. Use up to four times daily as directed. (FOR ICD-9 250.00, 250.01).     Cholecalciferol  50 MCG (2000 UT) TABS Take 1 tablet (2,000 Units total) by mouth daily. 90 tablet 1   dapagliflozin  propanediol (FARXIGA ) 10 MG TABS tablet Take 1 tablet (10 mg total) by mouth daily. 90 tablet 3   ezetimibe  (ZETIA ) 10 MG tablet Take 1 tablet (10 mg total) by mouth daily. 90 tablet 3   gabapentin  (NEURONTIN ) 300 MG capsule TAKE 1 CAPSULE(300 MG) BY MOUTH TWICE DAILY 180 capsule 1   glimepiride  (AMARYL ) 4 MG tablet Take 1 tablet (4 mg total) by mouth daily before breakfast. 100 tablet 3   Glucagon  (GVOKE HYPOPEN  2-PACK) 1 MG/0.2ML SOAJ Inject 1 Act into the skin daily as needed. 2 mL 5   glucose blood test strip Use Accu Chek Aviva Test Strips as  instructed to check blood sugar three times daily. 100 each 3   Insulin  Pen Needle (B-D UF III MINI PEN NEEDLES) 31G X 5 MM MISC Inject 1 Act into the skin 2 (two) times daily. ADMINISTER 30 UNITS EVERY EVENING 100 each 2   ketoconazole  (NIZORAL ) 2 % cream Apply 1 application. topically 2 (two) times daily. 60 g 3   losartan  (COZAAR ) 25 MG tablet Take 1 tablet (25 mg total) by mouth daily. 90 tablet 1   rivaroxaban  (XARELTO ) 10 MG TABS tablet Take 1 tablet (10 mg total) by mouth daily. 100 tablet 0   simvastatin  (ZOCOR ) 40 MG tablet Take 1 tablet (40 mg total) by mouth at bedtime. 100 tablet 0   solifenacin  (VESICARE ) 10 MG tablet Take 1 tablet (10 mg total) by mouth daily. 90 tablet 1   tamsulosin  (FLOMAX ) 0.4 MG CAPS capsule Take 0.4 mg by mouth daily.     traMADol  (ULTRAM -ER) 100 MG 24 hr tablet Take 1 tablet (100 mg total) by mouth daily. 90 tablet 0   Continuous Glucose Receiver (FREESTYLE LIBRE 3 READER) DEVI Use with free stype libre 3+ (Patient not taking: Reported on 03/21/2024) 1 each 0   Insulin  Lispro Prot & Lispro (HUMALOG  MIX 75/25 KWIKPEN) (75-25) 100 UNIT/ML Kwikpen Inject 36 Units into the skin in the morning and at bedtime. 30 mL 4   No current facility-administered medications for this visit.    PHYSICAL EXAM: Vitals:   03/21/24 0956  BP: 124/60  Pulse: 86  Resp: 20  SpO2: 95%  Weight: 154 lb 6.4 oz (70 kg)  Height: 5' 8.5 (1.74 m)       Body mass index is 23.13 kg/m.  Wt Readings from Last 3 Encounters:  03/21/24 154 lb 6.4 oz (70 kg)  02/09/24 160 lb 3.2 oz (72.7 kg)  01/10/24 163 lb 12.8 oz (74.3 kg)    General: Well developed, well nourished male in no apparent distress.  HEENT: AT/[place], no external lesions.  Eyes: Conjunctiva clear and no icterus. Neck: Neck supple  Lungs: Respirations not labored Neurologic: Alert, oriented, normal speech Extremities / Skin: Dry.   Psychiatric: Does not appear depressed or anxious  Diabetic Foot Exam - Simple   No  data filed    LABS Reviewed Lab Results  Component Value Date   HGBA1C 10.7 (A) 01/10/2024   HGBA1C 11.3 (A) 10/12/2023   HGBA1C 9.5 (A) 05/31/2023   Lab Results  Component Value Date   FRUCTOSAMINE 381 (H) 04/25/2023   FRUCTOSAMINE 431 (H) 12/02/2022   FRUCTOSAMINE 369 (H) 02/23/2022   Lab Results  Component Value Date   CHOL 132 02/09/2024   HDL 51.40 02/09/2024   LDLCALC 63 02/09/2024   LDLDIRECT 104.0 04/25/2023   TRIG 86.0 02/09/2024   CHOLHDL 3 02/09/2024   Lab Results  Component Value Date   MICRALBCREAT Unable to calculate 02/09/2024   MICRALBCREAT 1.5 02/15/2023   Lab Results  Component Value Date   CREATININE 1.13 12/13/2023   Lab Results  Component Value Date   GFR 75.49 04/25/2023    ASSESSMENT / PLAN  1. Uncontrolled type 2 diabetes mellitus with hyperglycemia, with long-term current use of insulin  (HCC)   2. Insulin -requiring or dependent type II diabetes mellitus (HCC)      Diabetes Mellitus type 2, complicated by peripheral neuropathy. - Diabetic status / severity: uncontrolled  Lab Results  Component Value Date   HGBA1C 10.7 (A) 01/10/2024    - Hemoglobin A1c goal : <7.5%  No glucose data to review and not able to adjust the diabetes regimen.  Diabetes control slightly improved to hemoglobin A1c 10.7% as of last visit.  Discussed about compliance with diabetes regimen including insulin .  Discussed about potential hypoglycemic symptoms as well.  - Medications: No change.  I) continue NovoLog  Mix 75/25: 36 units 2 times a day with breakfast and dinner. II) continue glimepiride  4 mg daily. III) continue Farxiga  10 mg daily.  - Home glucose testing: 2-3 times a day. He has glucometer and test supplies.  Asked to bring glucometer in the follow-up visit.  - Discussed/ Gave Hypoglycemia treatment plan.  # Consult : Diabetic educator the last visit.  # Annual urine for microalbuminuria/ creatinine ratio, no microalbuminuria currently,  continue ACE/ARB /losartan . Last  Lab Results  Component Value Date   MICRALBCREAT Unable to calculate 02/09/2024    # Foot check nightly / neuropathy, continue gabapentin .  # Annual dilated diabetic eye exams.  Advised to have diabetic eye exam.  - Diet: Eat reasonable portion sizes to promote a healthy weight - Life style / activity / exercise: Discussed/as tolerated.  2. Blood pressure  -  BP Readings from Last 1 Encounters:  03/21/24 124/60    - Control is in target.  - No change in current plans.  3. Lipid status / Hyperlipidemia - Last  Lab Results  Component Value Date   LDLCALC 63 02/09/2024   - Continue simvastatin  40 mg daily.  Zetia  10 mg daily.  Managed by primary care provider.  Diagnoses and all orders for this visit:  Uncontrolled type 2 diabetes mellitus with hyperglycemia, with long-term current use of insulin  (HCC) -     Insulin  Lispro Prot & Lispro (HUMALOG  MIX 75/25 KWIKPEN) (  75-25) 100 UNIT/ML Kwikpen; Inject 36 Units into the skin in the morning and at bedtime.  Insulin -requiring or dependent type II diabetes mellitus (HCC) -     Insulin  Lispro Prot & Lispro (HUMALOG  MIX 75/25 KWIKPEN) (75-25) 100 UNIT/ML Kwikpen; Inject 36 Units into the skin in the morning and at bedtime.    DISPOSITION Follow up in clinic in 3 months suggested.   All questions answered and patient verbalized understanding of the plan.  Charles Donise Woodle, MD [facility] Endocrinology [facility] [address] [place], [address] [place], [place] [address] Phone # [phone]  At least part of this note was generated using voice recognition software. Inadvertent word errors may have occurred, which were not recognized during the proofreading process.

## 2024-03-27 ENCOUNTER — Other Ambulatory Visit (HOSPITAL_COMMUNITY): Payer: Self-pay

## 2024-05-18 ENCOUNTER — Telehealth: Payer: Self-pay

## 2024-05-18 NOTE — Telephone Encounter (Signed)
 Copied from CRM #[id number]. Topic: Referral - Request for Referral >> May 18, 2024  1:33 PM Mercedes MATSU wrote: Patient is requesting a referral be sent over to:  [facility] P: [phone] F: [phone]  Patient is requesting home health services. Patient is requesting a [id number] Glyn Gerads (Older brother).

## 2024-05-23 NOTE — Telephone Encounter (Signed)
 Please advise. Or do he need an appointment before we place the referral?

## 2024-05-31 ENCOUNTER — Encounter: Payer: Self-pay | Admitting: Internal Medicine

## 2024-05-31 ENCOUNTER — Ambulatory Visit (INDEPENDENT_AMBULATORY_CARE_PROVIDER_SITE_OTHER): Admitting: Internal Medicine

## 2024-05-31 VITALS — BP 110/72 | HR 95 | Temp 98.2°F | Ht 68.5 in | Wt 150.1 lb

## 2024-05-31 DIAGNOSIS — Z794 Long term (current) use of insulin: Secondary | ICD-10-CM

## 2024-05-31 DIAGNOSIS — E1165 Type 2 diabetes mellitus with hyperglycemia: Secondary | ICD-10-CM

## 2024-05-31 DIAGNOSIS — R269 Unspecified abnormalities of gait and mobility: Secondary | ICD-10-CM

## 2024-05-31 DIAGNOSIS — E559 Vitamin D deficiency, unspecified: Secondary | ICD-10-CM

## 2024-05-31 DIAGNOSIS — I10 Essential (primary) hypertension: Secondary | ICD-10-CM

## 2024-05-31 DIAGNOSIS — R531 Weakness: Secondary | ICD-10-CM

## 2024-05-31 DIAGNOSIS — R32 Unspecified urinary incontinence: Secondary | ICD-10-CM | POA: Diagnosis not present

## 2024-05-31 NOTE — Assessment & Plan Note (Signed)
 Pt currently walks with cane, now for PT with home health

## 2024-05-31 NOTE — Assessment & Plan Note (Signed)
 Pt to continue fu with urology

## 2024-05-31 NOTE — Progress Notes (Signed)
 Patient ID: Charles Parker, male   DOB: 06/29/1946, 78 y.o.   MRN: [id number]        Chief Complaint: follow up general weakness, gait disorder, dm, htn, low vit d        HPI:  Charles Parker is a 78 y.o. male here with brother, pt has no hx of significant memory change, malignancy, heart disease or stroke and Denies worsening depressive symptoms, suicidal ideation, or panic; but has had gradually worsening 2-3 mo of getting harder to get around (no falls) and not keeping up the house.  Has some ongoing urinary incontinence but this is improved over the more severe incontinence after urology procedure may 2025.  Family asking for Integrated Home Health       Wt Readings from Last 3 Encounters:  05/31/24 150 lb 2 oz (68.1 kg)  03/21/24 154 lb 6.4 oz (70 kg)  02/09/24 160 lb 3.2 oz (72.7 kg)   BP Readings from Last 3 Encounters:  05/31/24 110/72  03/21/24 124/60  02/09/24 124/82         Past Medical History:  Diagnosis Date   Clotting disorder (HCC)    DVT   Diabetes mellitus    type II   Diverticulosis    DVT (deep venous thrombosis) (HCC)    Glaucoma    no eye drops    Hepatic cyst    Hypercholesterolemia    Hypertension    Renal cyst    Sleep apnea    Tubulovillous adenoma    Urinary incontinence    Past Surgical History:  Procedure Laterality Date   BLADDER NECK RECONSTRUCTION  02/03/2012   Procedure: BLADDER NECK REPAIR;  Surgeon: Elspeth KYM Schultze, MD;  Location: [facility];  Service: General;;   COLONOSCOPY  02/2013   polyp removal(mult.)   IR ANGIOGRAM SELECTIVE EACH ADDITIONAL VESSEL  12/13/2023   IR ANGIOGRAM SELECTIVE EACH ADDITIONAL VESSEL  12/13/2023   IR ANGIOGRAM VISCERAL SELECTIVE  12/13/2023   IR EMBO ARTERIAL NOT HEMORR HEMANG INC GUIDE ROADMAPPING  12/13/2023   IR RADIOLOGIST EVAL & MGMT  11/28/2023   IR RADIOLOGIST EVAL & MGMT  01/09/2024   IR US  GUIDE VASC ACCESS LEFT  12/13/2023   IR US  GUIDE VASC ACCESS LEFT  12/13/2023   IR US  GUIDE VASC ACCESS RIGHT   12/13/2023   KNEE SURGERY  2004   left   POLYPECTOMY     PROCTOSCOPY  02/03/2012   Procedure: PROCTOSCOPY;  Surgeon: Elspeth KYM Schultze, MD;  Location: [facility];  Service: General;;   STOMACH SURGERY  02/2012   TEE WITHOUT CARDIOVERSION N/A 10/05/2016   Procedure: TRANSESOPHAGEAL ECHOCARDIOGRAM (TEE);  Surgeon: Jerel Balding, MD;  Location: [facility];  Service: Cardiovascular;  Laterality: N/A;    reports that he has never smoked. He has never used smokeless tobacco. He reports that he does not drink alcohol  and does not use drugs. family history includes Cancer in his mother; Colon cancer (age of onset: 56) in his father; Diabetes in his mother; Hypertension in his mother. Allergies  Allergen Reactions   Metformin And Related Diarrhea   Codeine Rash    All over the body   Current Outpatient Medications on File Prior to Visit  Medication Sig Dispense Refill   Alcohol  Swabs  PADS Use to clean area before insulin  injections. DX E11.8 200 each 3   blood glucose meter kit and supplies KIT 1 each by Does not apply route daily as needed. Accu-Chek Aviva preferred by patient. Use  up to four times daily as directed. (FOR ICD-9 250.00, 250.01).     Cholecalciferol  50 MCG (2000 UT) TABS Take 1 tablet (2,000 Units total) by mouth daily. 90 tablet 1   Continuous Glucose Receiver (FREESTYLE LIBRE 3 READER) DEVI Use with free stype libre 3+ 1 each 0   dapagliflozin  propanediol (FARXIGA ) 10 MG TABS tablet Take 1 tablet (10 mg total) by mouth daily. 90 tablet 3   ezetimibe  (ZETIA ) 10 MG tablet Take 1 tablet (10 mg total) by mouth daily. 90 tablet 3   gabapentin  (NEURONTIN ) 300 MG capsule TAKE 1 CAPSULE(300 MG) BY MOUTH TWICE DAILY 180 capsule 1   glimepiride  (AMARYL ) 4 MG tablet Take 1 tablet (4 mg total) by mouth daily before breakfast. 100 tablet 3   Glucagon  (GVOKE HYPOPEN  2-PACK) 1 MG/0.2ML SOAJ Inject 1 Act into the skin daily as needed. 2 mL 5   glucose blood test strip Use Accu Chek Aviva Test Strips as  instructed to check blood sugar three times daily. 100 each 3   Insulin  Lispro Prot & Lispro (HUMALOG  MIX 75/25 KWIKPEN) (75-25) 100 UNIT/ML Kwikpen Inject 36 Units into the skin in the morning and at bedtime. 30 mL 4   Insulin  Pen Needle (B-D UF III MINI PEN NEEDLES) 31G X 5 MM MISC Inject 1 Act into the skin 2 (two) times daily. ADMINISTER 30 UNITS EVERY EVENING 100 each 2   ketoconazole  (NIZORAL ) 2 % cream Apply 1 application. topically 2 (two) times daily. 60 g 3   losartan  (COZAAR ) 25 MG tablet Take 1 tablet (25 mg total) by mouth daily. 90 tablet 1   rivaroxaban  (XARELTO ) 10 MG TABS tablet Take 1 tablet (10 mg total) by mouth daily. 100 tablet 0   simvastatin  (ZOCOR ) 40 MG tablet Take 1 tablet (40 mg total) by mouth at bedtime. 100 tablet 0   solifenacin  (VESICARE ) 10 MG tablet Take 1 tablet (10 mg total) by mouth daily. 90 tablet 1   tamsulosin  (FLOMAX ) 0.4 MG CAPS capsule Take 0.4 mg by mouth daily.     traMADol  (ULTRAM -ER) 100 MG 24 hr tablet Take 1 tablet (100 mg total) by mouth daily. 90 tablet 0   No current facility-administered medications on file prior to visit.        ROS:  All others reviewed and negative.  Objective        PE:  BP 110/72   Pulse 95   Temp 98.2 F (36.8 C) (Temporal)   Ht 5' 8.5 (1.74 m)   Wt 150 lb 2 oz (68.1 kg)   SpO2 94%   BMI 22.49 kg/m                 Constitutional: Pt appears in NAD               HENT: Head: NCAT.                Right Ear: External ear normal.                 Left Ear: External ear normal.                Eyes: . Pupils are equal, round, and reactive to light. Conjunctivae and EOM are normal               Nose: without d/c or deformity               Neck: Neck supple. Gross normal ROM  Cardiovascular: Normal rate and regular rhythm.                 Pulmonary/Chest: Effort normal and breath sounds without rales or wheezing.                Abd:  Soft, NT, ND, + BS, no organomegaly               Neurological: Pt  is alert. At baseline orientation, motor grossly intact               Skin: Skin is warm. No rashes, no other new lesions, LE edema - none               Psychiatric: Pt behavior is normal without agitation   Micro: none  Cardiac tracings I have personally interpreted today:  none  Pertinent Radiological findings (summarize): none   Lab Results  Component Value Date   WBC 5.9 12/13/2023   HGB 15.2 12/13/2023   HCT 45.2 12/13/2023   PLT 265 12/13/2023   GLUCOSE 229 (H) 12/13/2023   CHOL 132 02/09/2024   TRIG 86.0 02/09/2024   HDL 51.40 02/09/2024   LDLDIRECT 104.0 04/25/2023   LDLCALC 63 02/09/2024   ALT 51 02/09/2024   AST 30 02/09/2024   NA 138 12/13/2023   K 4.6 12/13/2023   CL 103 12/13/2023   CREATININE 1.13 12/13/2023   BUN 20 12/13/2023   CO2 22 12/13/2023   TSH 2.73 02/09/2024   PSA 2.22 09/21/2022   INR 1.1 12/13/2023   HGBA1C 10.7 (A) 01/10/2024   MICROALBUR <0.7 02/09/2024   Assessment/Plan:  Charles Parker is a 78 y.o. Black or African American [2] male with  has a past medical history of Clotting disorder (HCC), Diabetes mellitus, Diverticulosis, DVT (deep venous thrombosis) (HCC), Glaucoma, Hepatic cyst, Hypercholesterolemia, Hypertension, Renal cyst, Sleep apnea, Tubulovillous adenoma, and Urinary incontinence.  Gait disorder Pt currently walks with cane, now for PT with home health  General weakness Non specific it seems, but significant reducing his ability to care for himself and sometimes forgetting to take all medications.   May 2025 labs were quite good and will not repeat today, but ok for Home Health with RN eval, PT, OT, and aide.    Essential hypertension BP Readings from Last 3 Encounters:  05/31/24 110/72  03/21/24 124/60  02/09/24 124/82   Stable, pt to continue medical treatment losartan  25 mg qd   Uncontrolled type 2 diabetes mellitus with hyperglycemia, with long-term current use of insulin  (HCC) Lab Results  Component Value Date    HGBA1C 10.7 (A) 01/10/2024   Uncontrolled with likely missing medications at times, pt to continue current medical treatment with good compliance - farxiga  10 every day, glimeparide 4 mg every day, 75/25 - 36 units bid   Vitamin D  insufficiency Last vitamin D  Lab Results  Component Value Date   VD25OH 28.88 (L) 02/09/2024   Low, to start oral replacement   Urinary incontinence Pt to continue fu with urology  Followup: Return in about 3 months (around 08/31/2024) for follow up to Dr Joshua.  Lynwood Rush, MD 05/31/2024 10:29 AM [facility]

## 2024-05-31 NOTE — Assessment & Plan Note (Signed)
 Non specific it seems, but significant reducing his ability to care for himself and sometimes forgetting to take all medications.   May 2025 labs were quite good and will not repeat today, but ok for Home Health with RN eval, PT, OT, and aide.

## 2024-05-31 NOTE — Patient Instructions (Addendum)
 Please continue all other medications as before, and refills have been done if requested.  Please have the pharmacy call with any other refills you may need.  Please continue your efforts at being more active, low cholesterol diet, and weight control.  Please keep your appointments with your specialists as you may have planned  You will be contacted regarding the referral for: Home Health  We can hold on further lab testing today  Please see Dr Joshua in 3 months

## 2024-05-31 NOTE — Assessment & Plan Note (Signed)
 BP Readings from Last 3 Encounters:  05/31/24 110/72  03/21/24 124/60  02/09/24 124/82   Stable, pt to continue medical treatment losartan  25 mg qd

## 2024-05-31 NOTE — Assessment & Plan Note (Signed)
 Lab Results  Component Value Date   HGBA1C 10.7 (A) 01/10/2024   Uncontrolled with likely missing medications at times, pt to continue current medical treatment with good compliance - farxiga  10 every day, glimeparide 4 mg every day, 75/25 - 36 units bid

## 2024-05-31 NOTE — Assessment & Plan Note (Signed)
 Last vitamin D  Lab Results  Component Value Date   VD25OH 28.88 (L) 02/09/2024   Low, to start oral replacement

## 2024-06-08 DIAGNOSIS — R3915 Urgency of urination: Secondary | ICD-10-CM | POA: Diagnosis not present

## 2024-06-08 DIAGNOSIS — R351 Nocturia: Secondary | ICD-10-CM | POA: Diagnosis not present

## 2024-06-08 DIAGNOSIS — N401 Enlarged prostate with lower urinary tract symptoms: Secondary | ICD-10-CM | POA: Diagnosis not present

## 2024-06-12 NOTE — Telephone Encounter (Signed)
 Already done aug 28

## 2024-06-12 NOTE — Telephone Encounter (Signed)
 You recently seen this patient and he wants to know if it would be okay for you to place this referral for him?

## 2024-06-15 NOTE — Telephone Encounter (Signed)
 Patient and his brother has been made aware that the referral has been placed.

## 2024-06-28 ENCOUNTER — Ambulatory Visit: Admitting: Endocrinology

## 2024-06-29 ENCOUNTER — Other Ambulatory Visit: Payer: Self-pay | Admitting: Internal Medicine

## 2024-06-29 DIAGNOSIS — E785 Hyperlipidemia, unspecified: Secondary | ICD-10-CM

## 2024-06-29 MED ORDER — SIMVASTATIN 40 MG PO TABS: 40.0000 mg | ORAL_TABLET | Freq: Every day | ORAL | 0 refills | Status: AC

## 2024-06-29 NOTE — Telephone Encounter (Signed)
 Copied from CRM [id number]. Topic: Clinical - Medication Refill >> Jun 29, 2024 12:32 PM [place] E wrote: Medication: simvastatin  (ZOCOR ) 40 MG tablet   Has the patient contacted their pharmacy? Yes (Agent: If no, request that the patient contact the pharmacy for the refill. If patient does not wish to contact the pharmacy document the reason why and proceed with request.) (Agent: If yes, when and what did the pharmacy advise?)  This is the patient's preferred pharmacy:  [facility] [phone] - [place], [place] - [address] AT [facility] [address] [place] [address] Phone: [phone] Fax: [phone]   Is this the correct pharmacy for this prescription? Yes If no, delete pharmacy and type the correct one.   Has the prescription been filled recently? No  Is the patient out of the medication? Yes, requesting 100-day supply.  Has the patient been seen for an appointment in the last year OR does the patient have an upcoming appointment? Yes  Can we respond through MyChart? Yes  Agent: Please be advised that Rx refills may take up to 3 business days. We ask that you follow-up with your pharmacy.

## 2024-07-18 ENCOUNTER — Telehealth: Payer: Self-pay

## 2024-07-18 NOTE — Telephone Encounter (Signed)
 Copied from CRM #[id number]. Topic: General - Deceased Patient >> 07/19/24  9:09 AM Macario HERO wrote: Name of caller: Curtistine Specking  Date of death: 16-Jul-2024 (   Name of funeral home: [facility] [employer]  Phone number of funeral home: [phone]  Provider that needs to sign form: N/A  Timeline for signing: N/A

## 2024-07-18 NOTE — Telephone Encounter (Signed)
 FYI

## 2024-08-04 DEATH — deceased

## 2024-12-19 ENCOUNTER — Ambulatory Visit

## 2024-12-20 ENCOUNTER — Ambulatory Visit
